# Patient Record
Sex: Male | Born: 1965 | Hispanic: No | Smoking: Current every day smoker
Health system: Southern US, Community
[De-identification: ages and names within clinical notes are randomized; demographics above are authoritative.]

## PROBLEM LIST (undated history)

## (undated) DIAGNOSIS — F410 Panic disorder [episodic paroxysmal anxiety] without agoraphobia: Secondary | ICD-10-CM

## (undated) DIAGNOSIS — I671 Cerebral aneurysm, nonruptured: Secondary | ICD-10-CM

## (undated) DIAGNOSIS — F329 Major depressive disorder, single episode, unspecified: Secondary | ICD-10-CM

## (undated) DIAGNOSIS — E119 Type 2 diabetes mellitus without complications: Secondary | ICD-10-CM

## (undated) DIAGNOSIS — J439 Emphysema, unspecified: Secondary | ICD-10-CM

## (undated) DIAGNOSIS — K631 Perforation of intestine (nontraumatic): Secondary | ICD-10-CM

## (undated) DIAGNOSIS — E785 Hyperlipidemia, unspecified: Secondary | ICD-10-CM

## (undated) DIAGNOSIS — K859 Acute pancreatitis without necrosis or infection, unspecified: Secondary | ICD-10-CM

## (undated) DIAGNOSIS — Z8614 Personal history of Methicillin resistant Staphylococcus aureus infection: Secondary | ICD-10-CM

## (undated) DIAGNOSIS — J45909 Unspecified asthma, uncomplicated: Secondary | ICD-10-CM

## (undated) DIAGNOSIS — K219 Gastro-esophageal reflux disease without esophagitis: Secondary | ICD-10-CM

## (undated) DIAGNOSIS — G3184 Mild cognitive impairment, so stated: Secondary | ICD-10-CM

## (undated) DIAGNOSIS — N529 Male erectile dysfunction, unspecified: Secondary | ICD-10-CM

## (undated) DIAGNOSIS — I10 Essential (primary) hypertension: Secondary | ICD-10-CM

## (undated) DIAGNOSIS — I509 Heart failure, unspecified: Secondary | ICD-10-CM

## (undated) DIAGNOSIS — Z515 Encounter for palliative care: Secondary | ICD-10-CM

## (undated) DIAGNOSIS — G8929 Other chronic pain: Secondary | ICD-10-CM

## (undated) DIAGNOSIS — F32A Depression, unspecified: Secondary | ICD-10-CM

## (undated) DIAGNOSIS — I2489 Other forms of acute ischemic heart disease: Secondary | ICD-10-CM

## (undated) DIAGNOSIS — F419 Anxiety disorder, unspecified: Secondary | ICD-10-CM

## (undated) DIAGNOSIS — IMO0002 Reserved for concepts with insufficient information to code with codable children: Secondary | ICD-10-CM

## (undated) DIAGNOSIS — N185 Chronic kidney disease, stage 5: Secondary | ICD-10-CM

## (undated) DIAGNOSIS — Z720 Tobacco use: Secondary | ICD-10-CM

## (undated) DIAGNOSIS — M549 Dorsalgia, unspecified: Secondary | ICD-10-CM

## (undated) DIAGNOSIS — I5032 Chronic diastolic (congestive) heart failure: Secondary | ICD-10-CM

## (undated) DIAGNOSIS — A4902 Methicillin resistant Staphylococcus aureus infection, unspecified site: Secondary | ICD-10-CM

## (undated) DIAGNOSIS — H4310 Vitreous hemorrhage, unspecified eye: Secondary | ICD-10-CM

## (undated) DIAGNOSIS — F1910 Other psychoactive substance abuse, uncomplicated: Secondary | ICD-10-CM

## (undated) DIAGNOSIS — N184 Chronic kidney disease, stage 4 (severe): Secondary | ICD-10-CM

## (undated) DIAGNOSIS — I609 Nontraumatic subarachnoid hemorrhage, unspecified: Secondary | ICD-10-CM

## (undated) DIAGNOSIS — G4730 Sleep apnea, unspecified: Secondary | ICD-10-CM

## (undated) DIAGNOSIS — E669 Obesity, unspecified: Secondary | ICD-10-CM

## (undated) HISTORY — DX: Sleep apnea, unspecified: G47.30

## (undated) HISTORY — DX: Methicillin resistant Staphylococcus aureus infection, unspecified site: A49.02

## (undated) HISTORY — DX: Obesity, unspecified: E66.9

## (undated) HISTORY — PX: OTHER SURGICAL HISTORY: SHX169

## (undated) HISTORY — DX: Essential (primary) hypertension: I10

## (undated) HISTORY — DX: Chronic kidney disease, stage 4 (severe): N18.4

## (undated) HISTORY — DX: Reserved for concepts with insufficient information to code with codable children: IMO0002

## (undated) HISTORY — DX: Type 2 diabetes mellitus without complications: E11.9

## (undated) HISTORY — DX: Heart failure, unspecified: I50.9

## (undated) HISTORY — DX: Other psychoactive substance abuse, uncomplicated: F19.10

## (undated) HISTORY — PX: KNEE SURGERY: SHX244

## (undated) HISTORY — DX: Major depressive disorder, single episode, unspecified: F32.9

## (undated) HISTORY — DX: Acute pancreatitis without necrosis or infection, unspecified: K85.90

## (undated) HISTORY — DX: Nontraumatic subarachnoid hemorrhage, unspecified: I60.9

## (undated) HISTORY — DX: Depression, unspecified: F32.A

## (undated) HISTORY — DX: Panic disorder (episodic paroxysmal anxiety): F41.0

## (undated) HISTORY — DX: Dorsalgia, unspecified: M54.9

## (undated) HISTORY — DX: Tobacco use: Z72.0

## (undated) HISTORY — DX: Perforation of intestine (nontraumatic): K63.1

## (undated) HISTORY — DX: Chronic diastolic (congestive) heart failure: I50.32

## (undated) HISTORY — DX: Vitreous hemorrhage, unspecified eye: H43.10

## (undated) HISTORY — DX: Emphysema, unspecified: J43.9

## (undated) HISTORY — DX: Unspecified asthma, uncomplicated: J45.909

## (undated) HISTORY — DX: Gastro-esophageal reflux disease without esophagitis: K21.9

## (undated) HISTORY — DX: Mild cognitive impairment of uncertain or unknown etiology: G31.84

## (undated) HISTORY — DX: Anxiety disorder, unspecified: F41.9

---

## 2003-11-29 ENCOUNTER — Other Ambulatory Visit: Payer: Self-pay

## 2004-11-20 ENCOUNTER — Emergency Department: Payer: Self-pay | Admitting: Emergency Medicine

## 2005-01-24 ENCOUNTER — Inpatient Hospital Stay: Payer: Self-pay | Admitting: Psychiatry

## 2005-01-24 IMAGING — US ABDOMEN ULTRASOUND
1 series · 13 of 25 positions shown · non-contrast
Comparison: none

REASON FOR EXAM: Pancreatis, ETOH abuse, rule out gallbadder, history of
gastritis
COMMENTS:

[Series 1: abdomen ultrasound · 0.39mm/px · 13 of 52 slices shown]
[im 1/52]
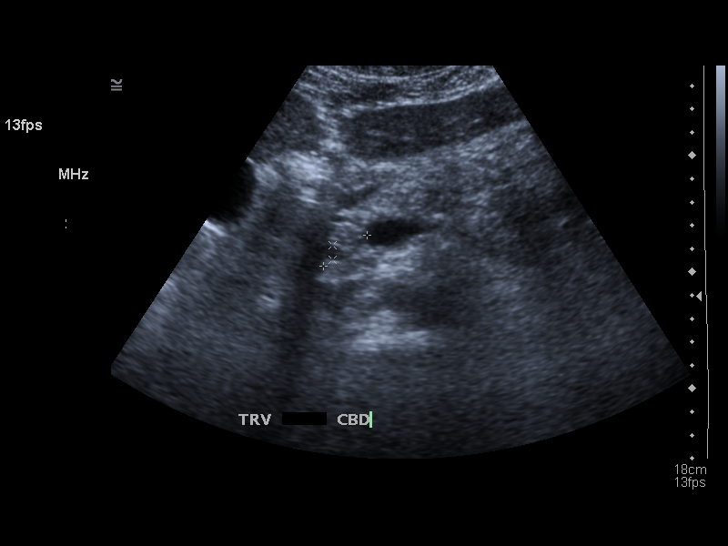
[im 5/52]
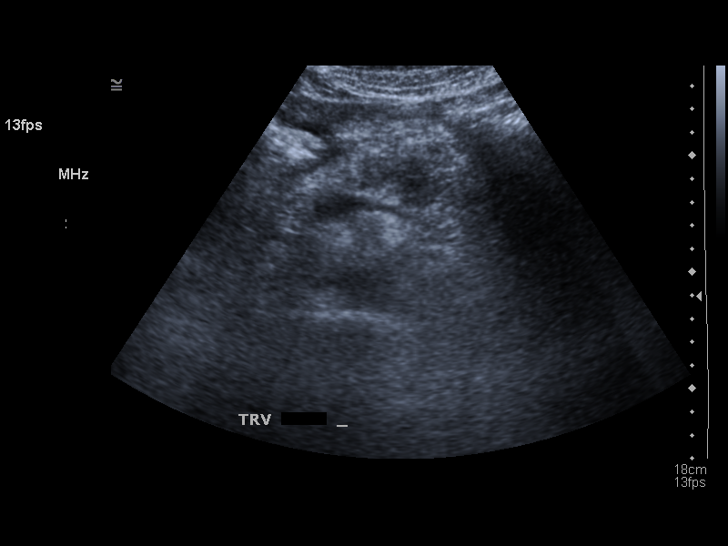
[im 9/52]
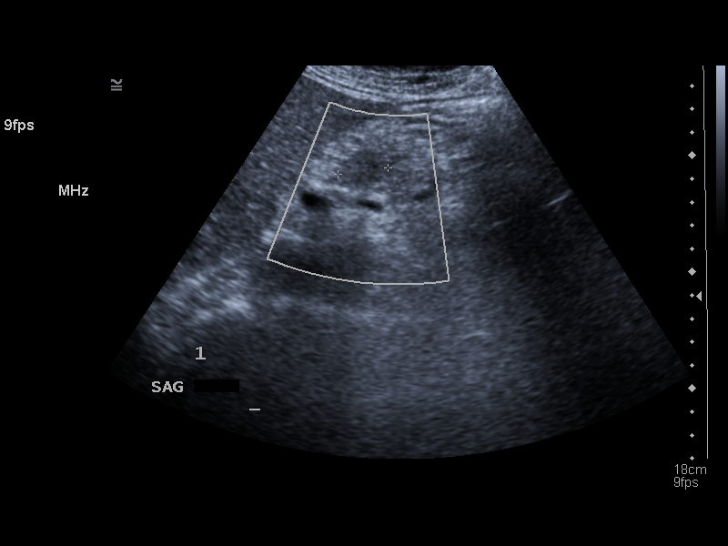
[im 13/52]
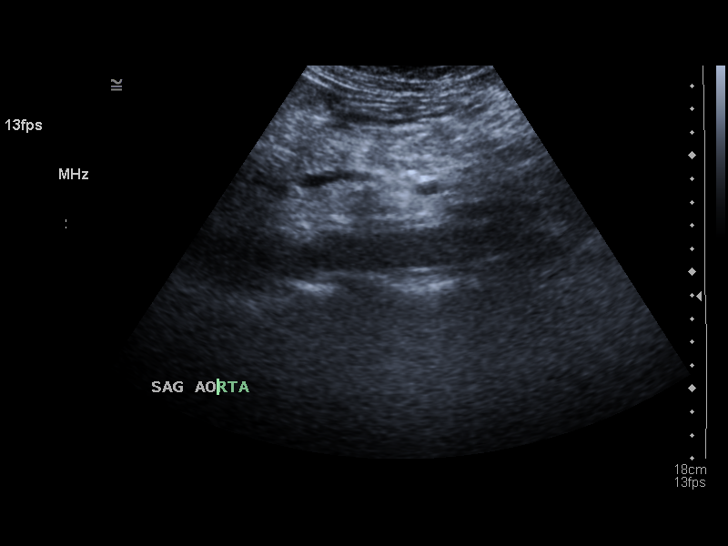
[im 18/52]
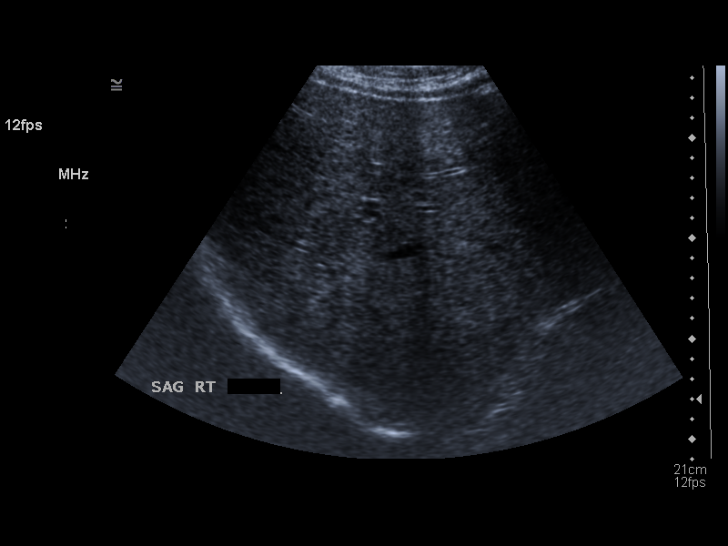
[im 22/52]
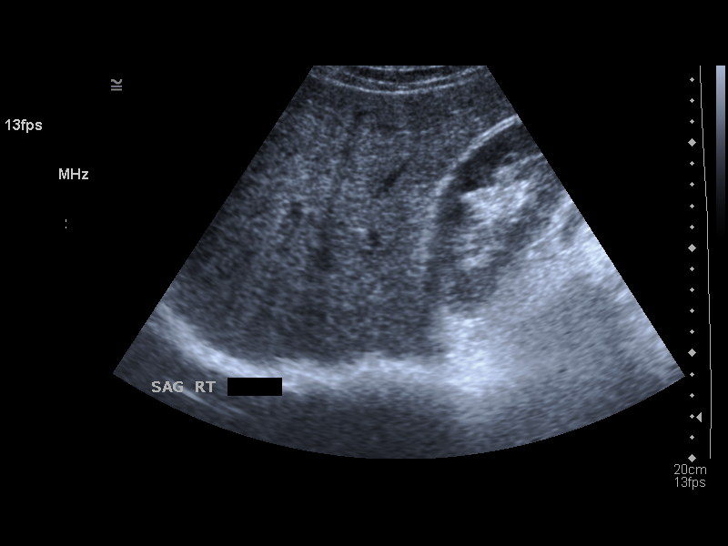
[im 26/52]
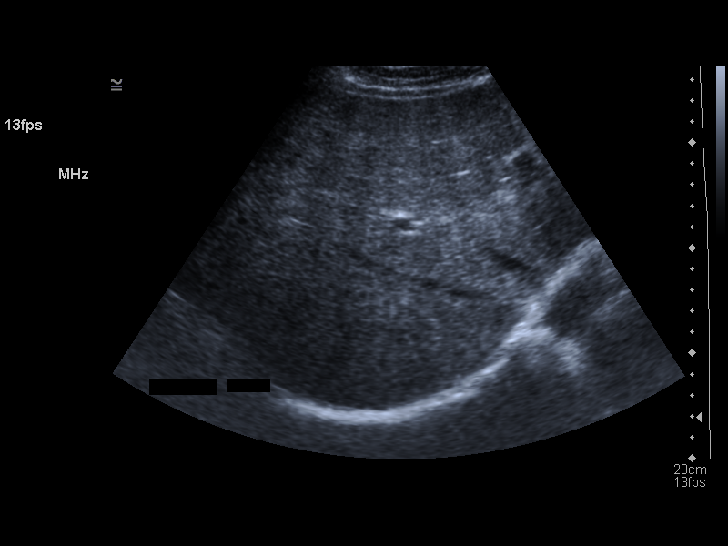
[im 30/52]
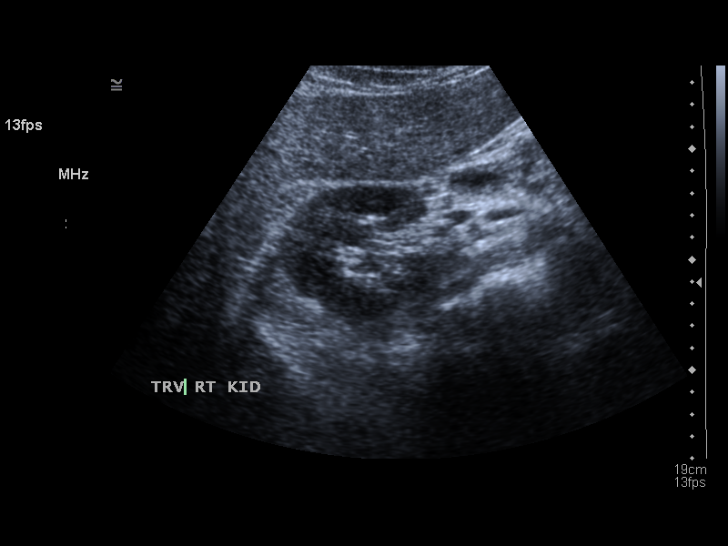
[im 35/52]
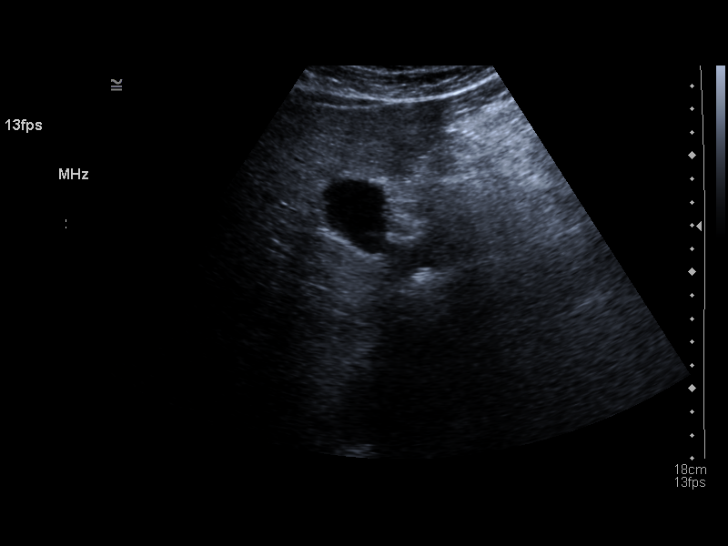
[im 39/52]
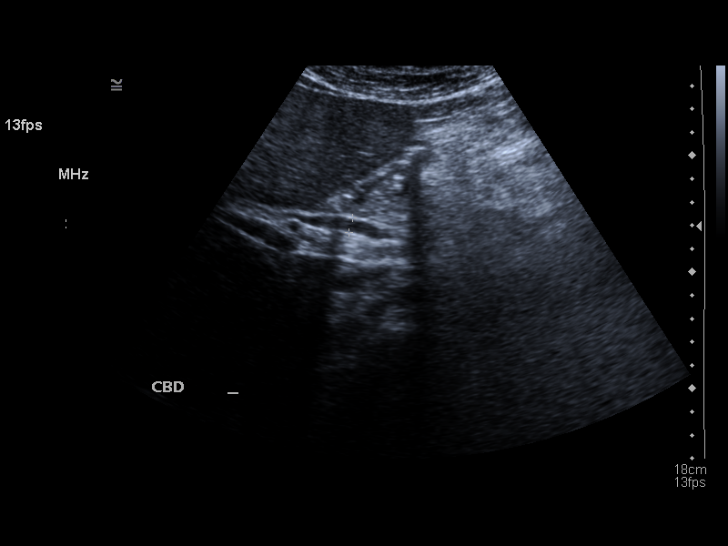
[im 43/52]
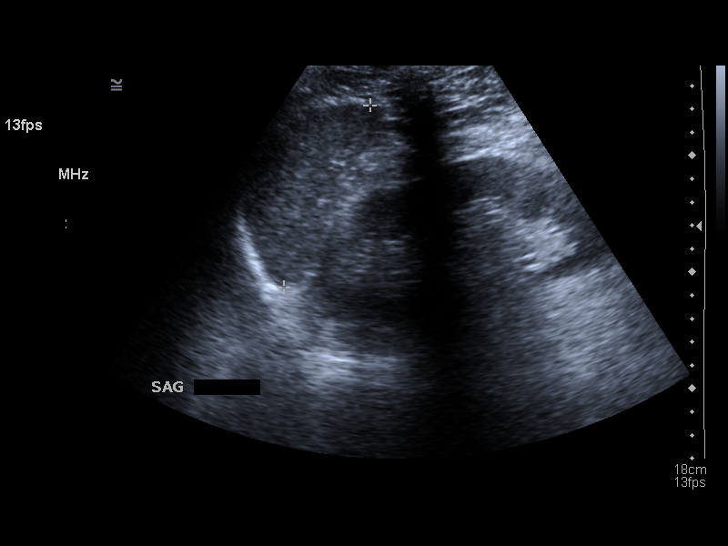
[im 47/52]
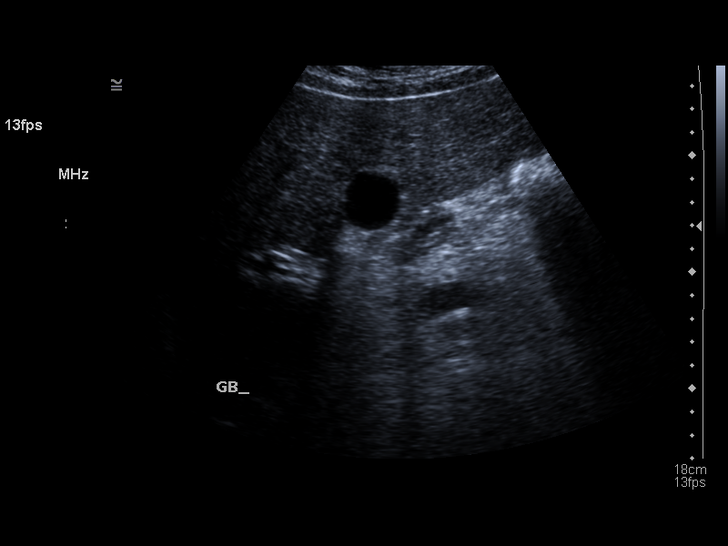
[im 52/52]
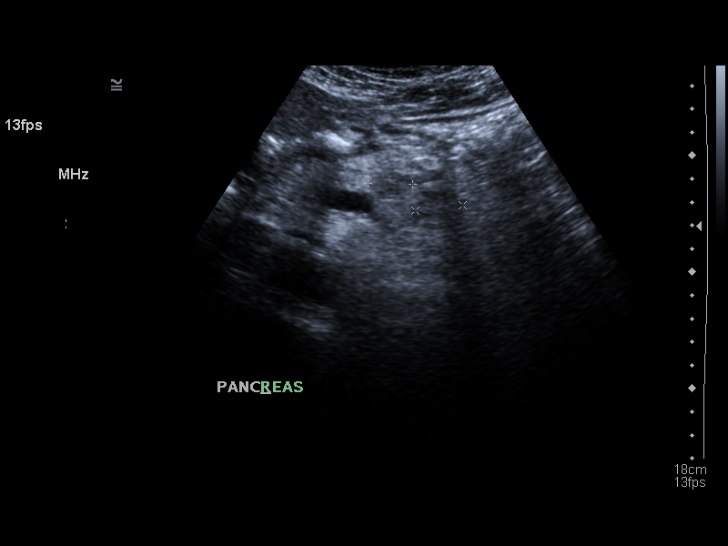

[13 of 25 positions shown; findings below may reference images not displayed]

PROCEDURE:     US  - US ABDOMEN GENERAL SURVEY  - [DATE]  [DATE]

RESULT:     The liver and spleen are normal in appearance.  The pancreas is
visualized.  The body and tail of the pancreas are enlarged.  The body of
the pancreas measures 4 cm in AP diameter and the pancreatic tail measures
3.0 cm in AP diameter.  The pancreas in the region of the body and tail has
increased in size since the prior exam of [DATE].  There are noted
two hypoechoic areas in the body of the pancreas.  It is to me uncertain as
to whether these are focal masses or focal areas of pancreatitis.  Further
evaluation of the pancreas by CT is recommended.  A note is made that the
pancreatic head is normal in appearance.

No gallstones are seen.  There is no thickening of the gallbladder wall.
The common bile duct measures 6.4 mm in diameter, which is upper limits for
normal and which has increased since the previous examination.  The kidneys
show no hydronephrosis.  There is no ascites.
IMPRESSION: There is prominence of the body and tail of the pancreas as described above
suspicious for mass although focal pancreatitis could produce similar
changes.  Further evaluation of the pancreas by CT is recommended.

The common bile duct measures 6.4 mm in diameter, which is upper limits for
normal and which has increased since the prior exam.  The gallbladder,
however, is normal in appearance with there being no evidence for
gallstones.

## 2005-01-26 IMAGING — CT CT ABDOMEN W/ CM
1 of 2 series · 14 of 32 positions shown, 19 images · non-contrast
Comparison: none

REASON FOR EXAM: abdominal pain, abnormal Abdominal Ultrasound with
pancreatic enlargement
COMMENTS:  LMP: (Male)

[Series 2: pancreas · axial · 0.72mm/px · z∈[-304,-40]mm · 14 of 74 slices shown, 19 images]
[im 4/74  soft-tissue]
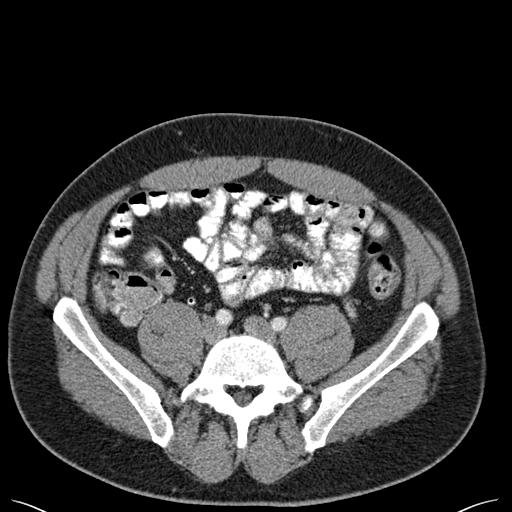
[im 4/74  bone]
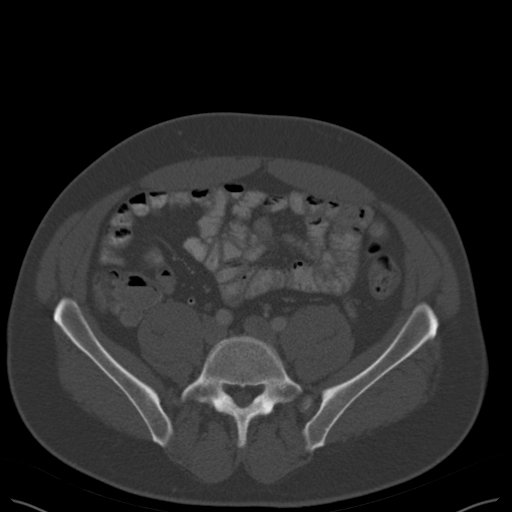
[im 11/74  soft-tissue]
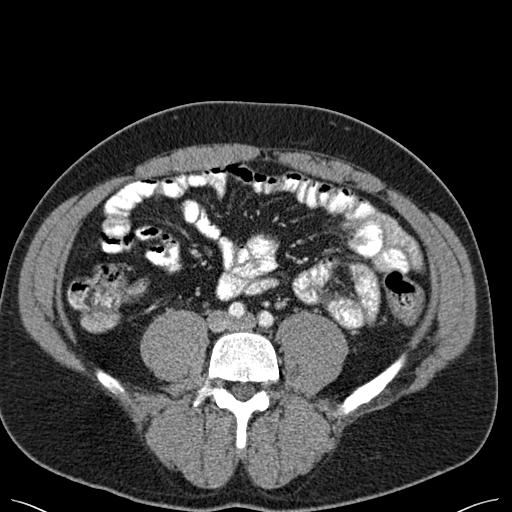
[im 14/74  soft-tissue]
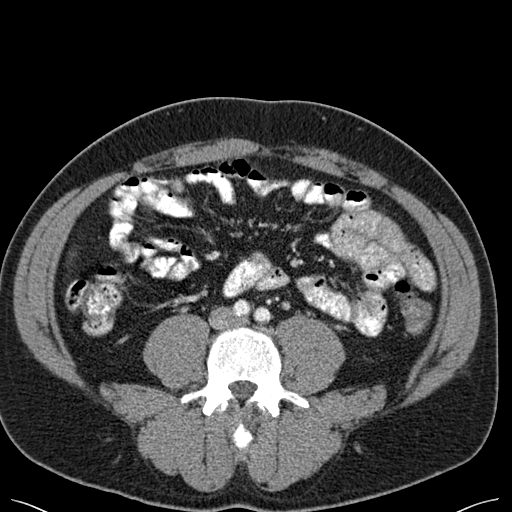
[im 21/74  soft-tissue]
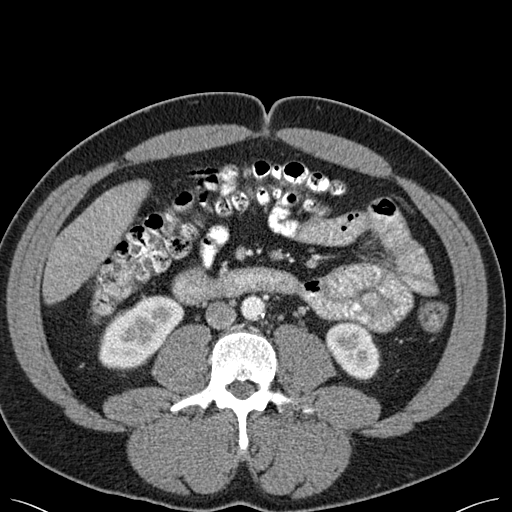
[im 25/74  soft-tissue]
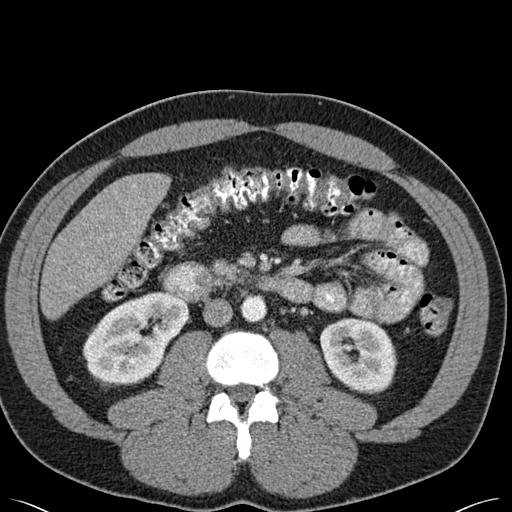
[im 32/74  soft-tissue]
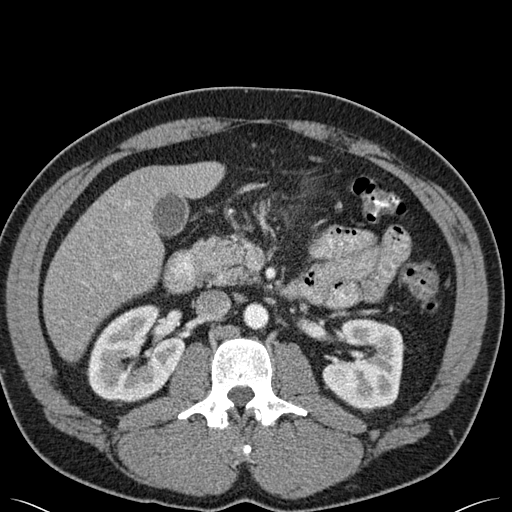
[im 39/74  soft-tissue]
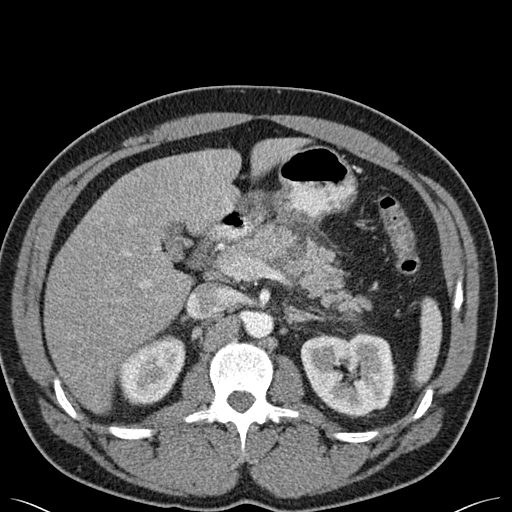
[im 42/74  soft-tissue]
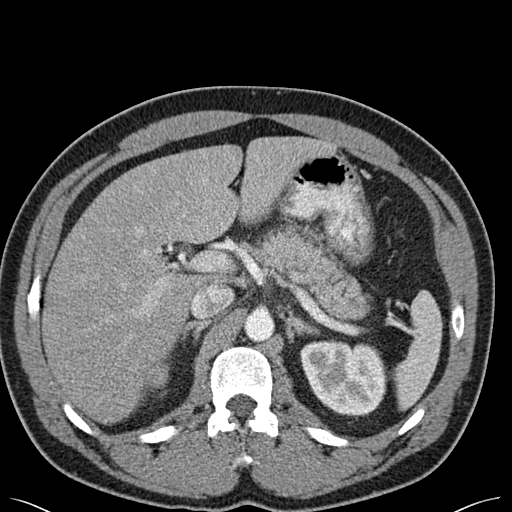
[im 49/74  soft-tissue]
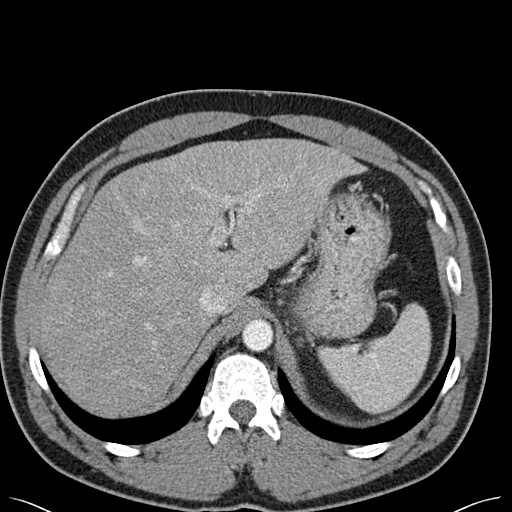
[im 49/74  bone]
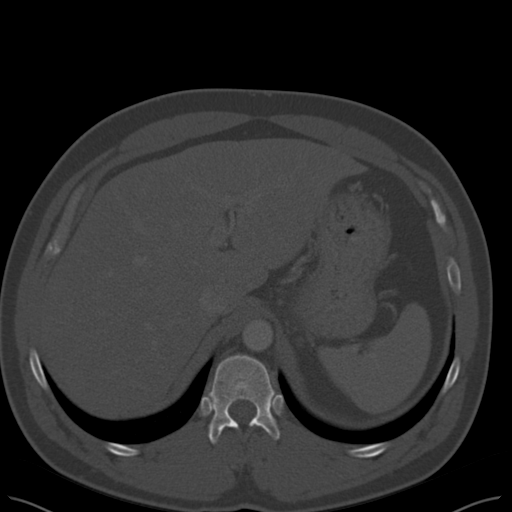
[im 53/74  soft-tissue]
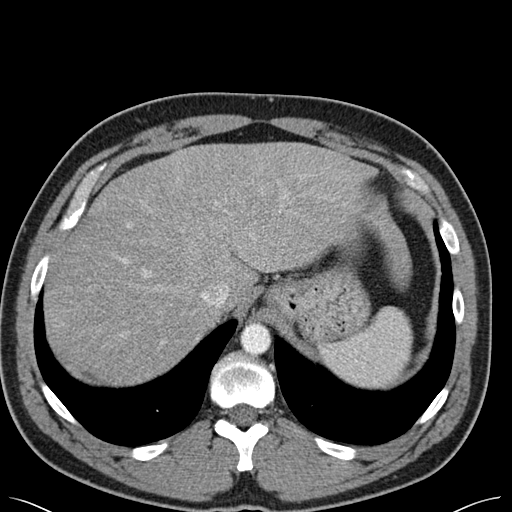
[im 60/74  soft-tissue]
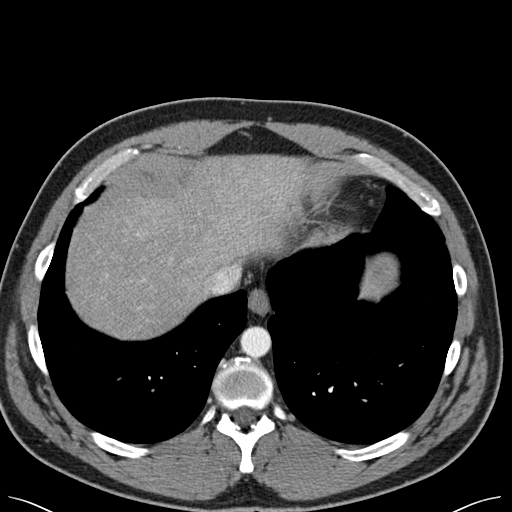
[im 60/74  lung]
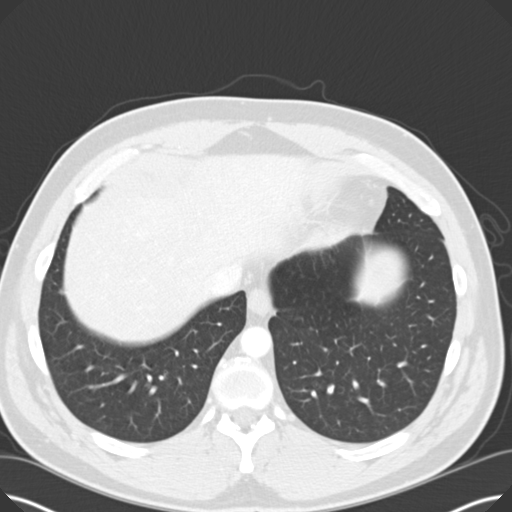
[im 63/74  soft-tissue]
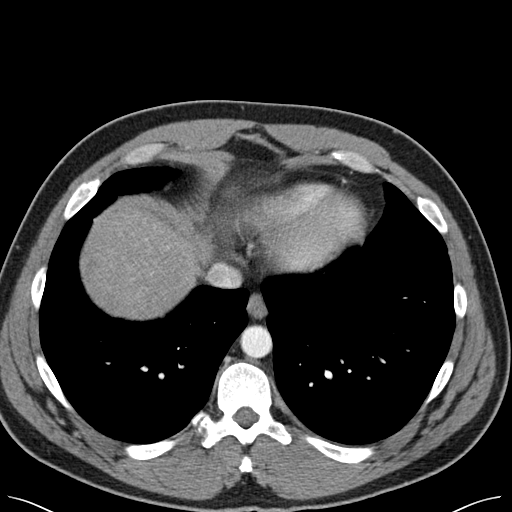
[im 63/74  lung]
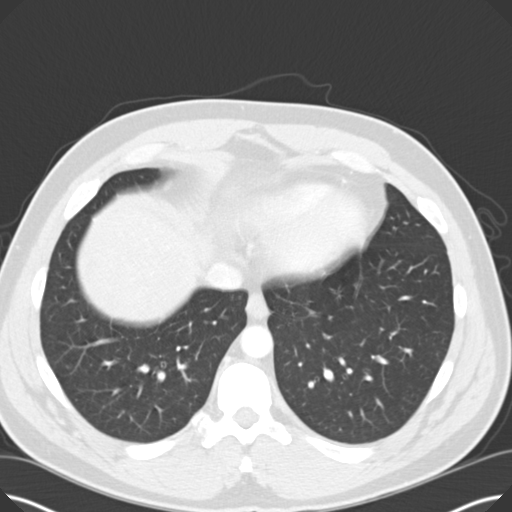
[im 67/74  lung]
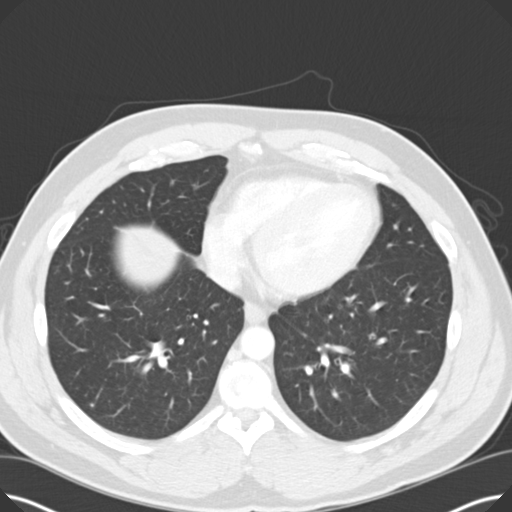
[im 70/74  soft-tissue]
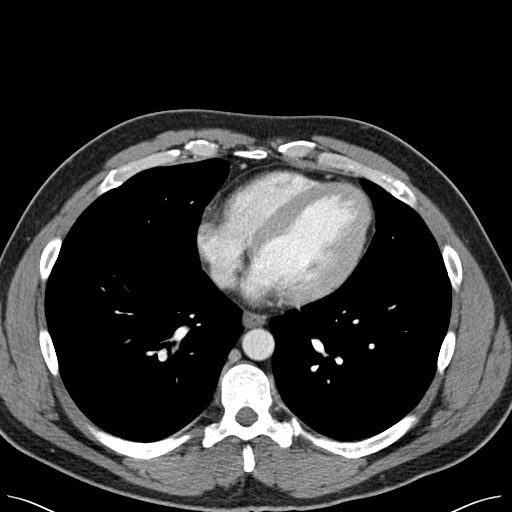
[im 70/74  lung]
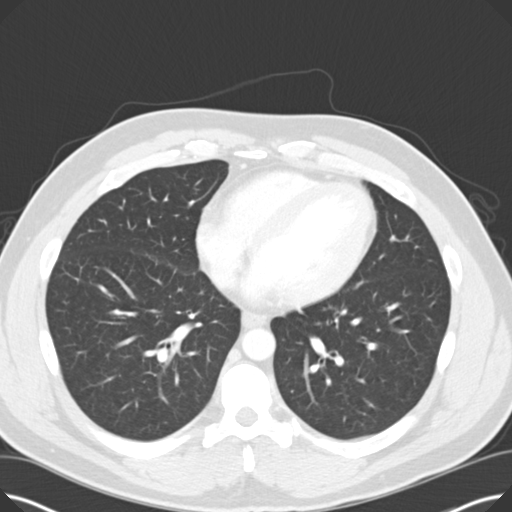

[14 of 32 positions shown; findings below may reference images not displayed]

PROCEDURE:     CT  - CT ABDOMEN COMPLEX W  - [DATE]  [DATE]

RESULT:     Spiral 4.0 mm sections are obtained from the lung bases through
the pubic symphysis status post intravenous administration of 100 cc's of
[AX] and oral contrast.

Evaluation of the lung bases demonstrates no evidence of focal infiltrates,
effusions or edema.

The liver, spleen, adrenals and kidneys are unremarkable.

Evaluation of the pancreas demonstrates enlargement of the head and proximal
body of the pancreas with focal areas of low attenuation within the
pancreatic parenchyma. There are edematous changes within the surrounding
mesenteric fat as well as a trace amount of fluid within the peripancreatic
fat along the head and proximal body region. This study is compared to a
previous study of the abdomen and pelvis dated [DATE]. The findings
within the pancreas have occurred in the interim and may represent the
sequelae of pancreatitis. Correlation with the patient's physical signs and
pancreatic function tests is recommended. Etiologies such as neoplastic
disease cannot be completely excluded though are of lower differential
consideration at this time unless the pancreatic laboratory data dictates
otherwise. There does not appear to be evidence of abdominal adenopathy or
drainable loculated fluid collections. The SMA, portal vein, SMV and splenic
artery appear intact with no evidence of aneurysmal dilatation. No CT
evidence is appreciated to suggest a pseudocyst.
IMPRESSION: 1.     Findings likely representing the sequelae of pancreatitis involving
the head and body of the pancreas as described above. These findings appear
to have occurred in the interim when compared to the previous study dated
[DATE]. Correlation with pancreatic function tests and the patient's
clinical signs and symptoms is recommended.
2.     A repeat evaluation can be obtained as clinically warranted to
monitor the pancreas.

## 2005-01-30 ENCOUNTER — Ambulatory Visit: Payer: Self-pay | Admitting: Unknown Physician Specialty

## 2005-02-07 ENCOUNTER — Ambulatory Visit: Payer: Self-pay | Admitting: Unknown Physician Specialty

## 2005-03-10 ENCOUNTER — Ambulatory Visit: Payer: Self-pay | Admitting: Unknown Physician Specialty

## 2005-04-09 ENCOUNTER — Ambulatory Visit: Payer: Self-pay | Admitting: Unknown Physician Specialty

## 2005-12-10 DIAGNOSIS — K631 Perforation of intestine (nontraumatic): Secondary | ICD-10-CM

## 2005-12-10 HISTORY — PX: COLON SURGERY: SHX602

## 2005-12-10 HISTORY — DX: Perforation of intestine (nontraumatic): K63.1

## 2006-08-08 ENCOUNTER — Emergency Department: Payer: Self-pay

## 2006-08-08 IMAGING — CT CT ABD-PELV W/ CM
1 of 2 series · 15 of 32 positions shown, 19 images · non-contrast
Comparison: none

REASON FOR EXAM: Abdominal and pelvic trauma
COMMENTS:

[Series 2: abdomen · axial · 0.80mm/px · z∈[-9,+431]mm · 15 of 96 slices shown, 19 images]
[im 4/96  soft-tissue]
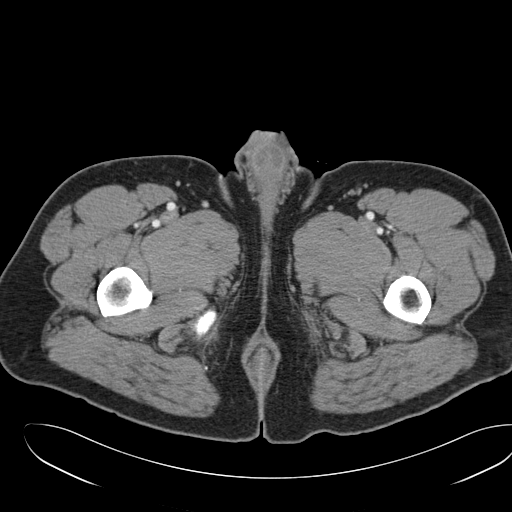
[im 4/96  bone]
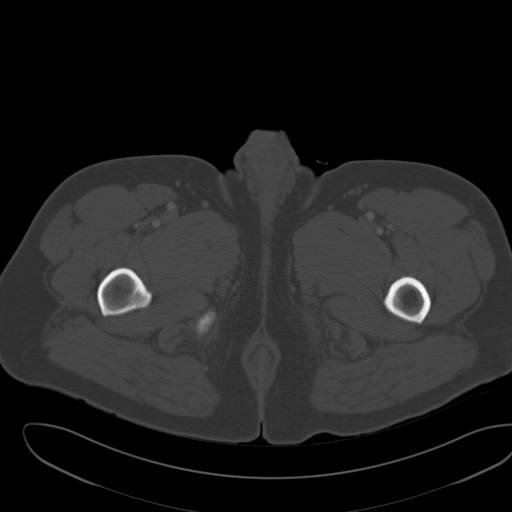
[im 12/96  soft-tissue]
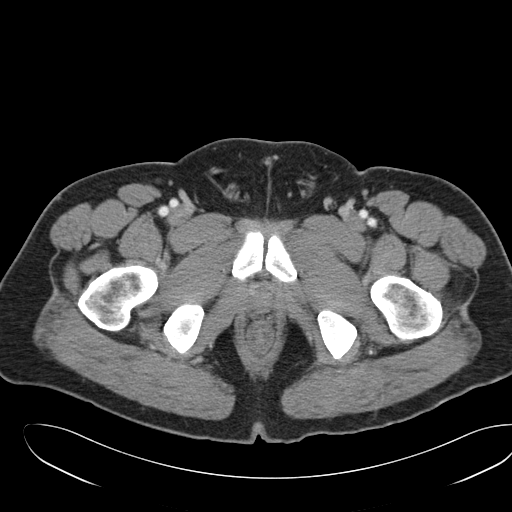
[im 20/96  soft-tissue]
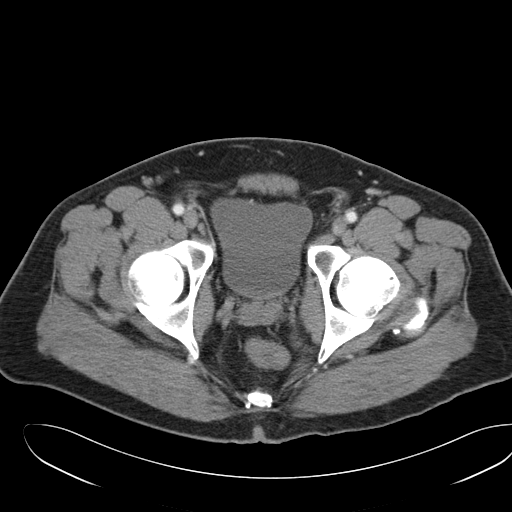
[im 28/96  soft-tissue]
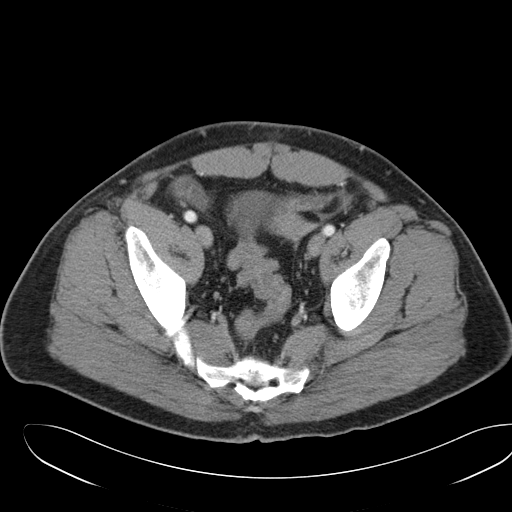
[im 32/96  soft-tissue]
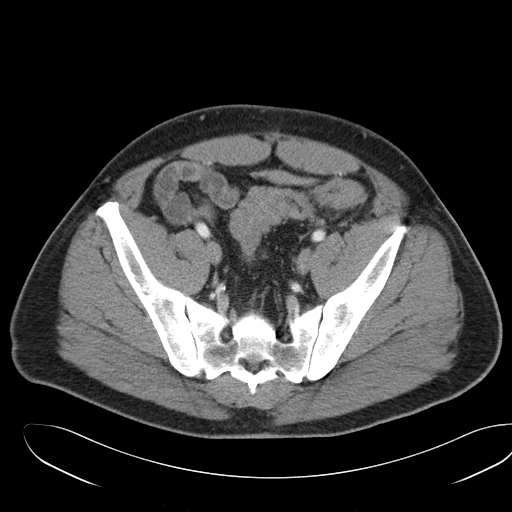
[im 40/96  soft-tissue]
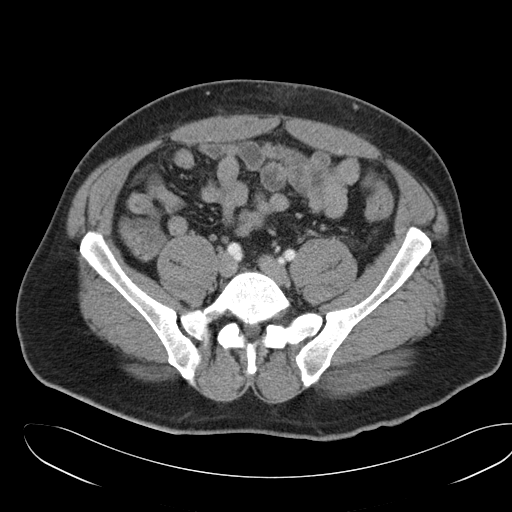
[im 48/96  soft-tissue]
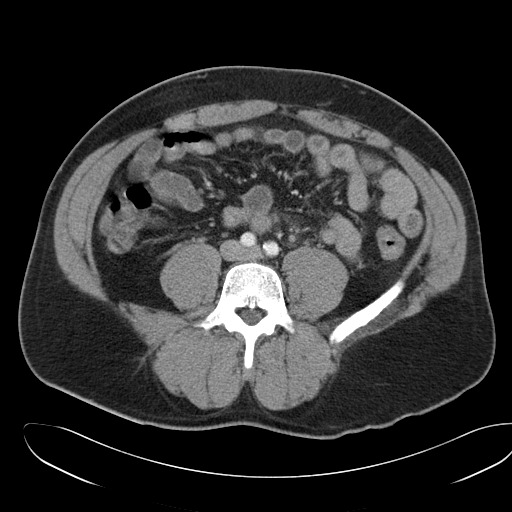
[im 56/96  soft-tissue]
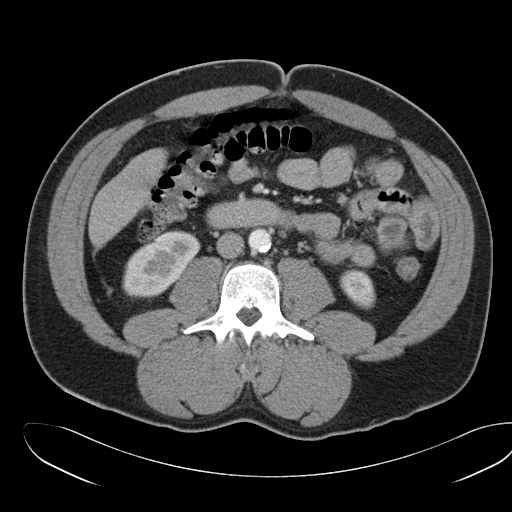
[im 64/96  soft-tissue]
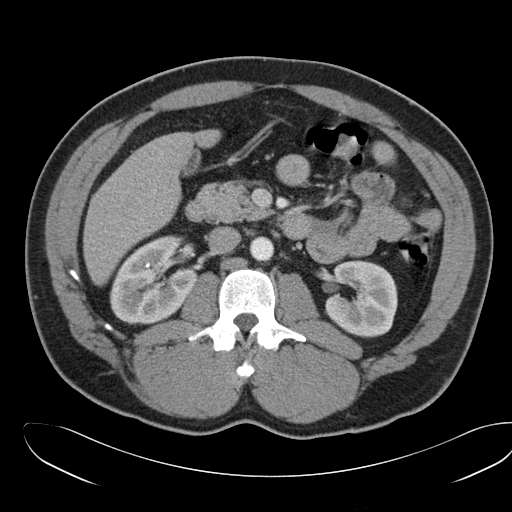
[im 64/96  bone]
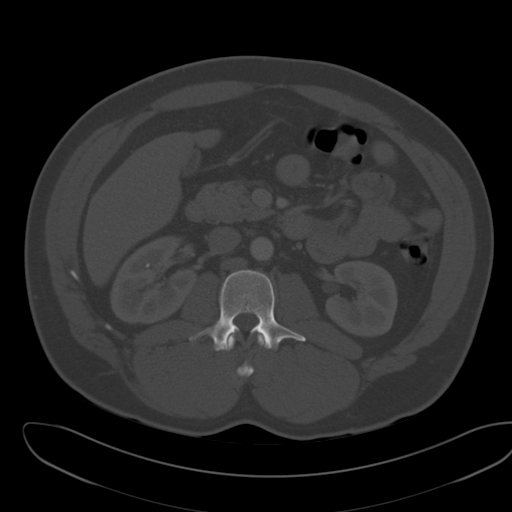
[im 68/96  soft-tissue]
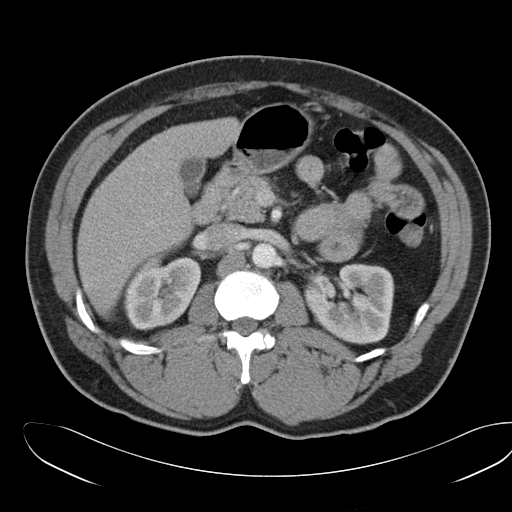
[im 76/96  soft-tissue]
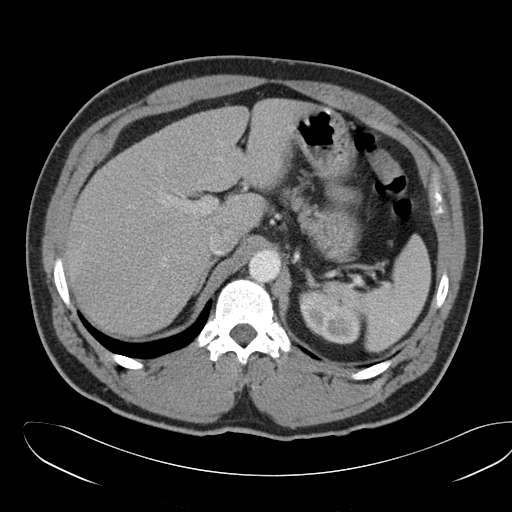
[im 80/96  lung]
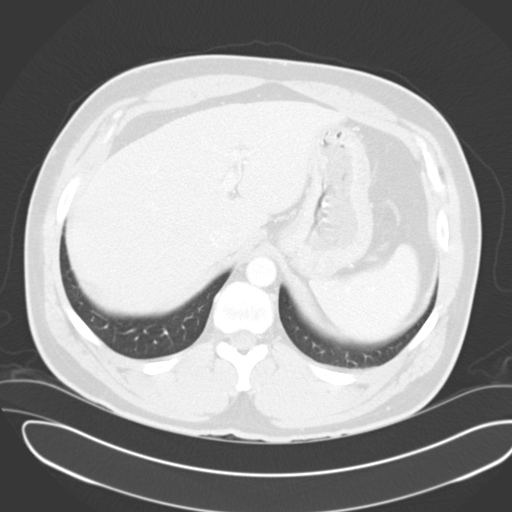
[im 84/96  soft-tissue]
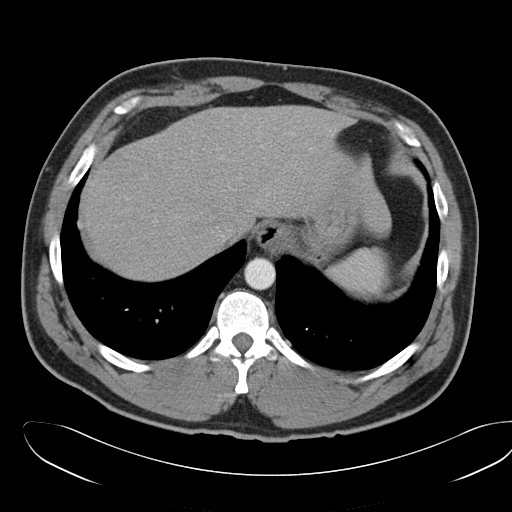
[im 84/96  lung]
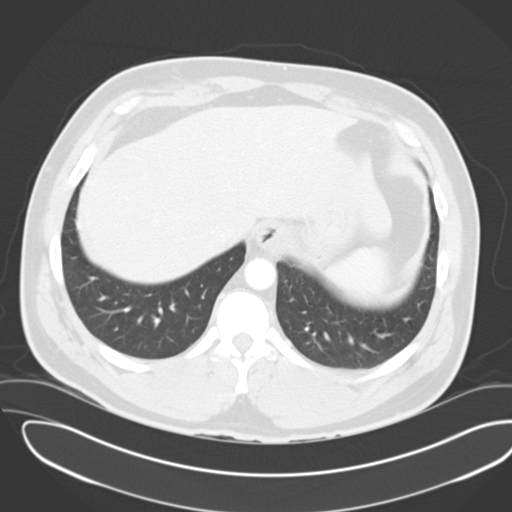
[im 88/96  lung]
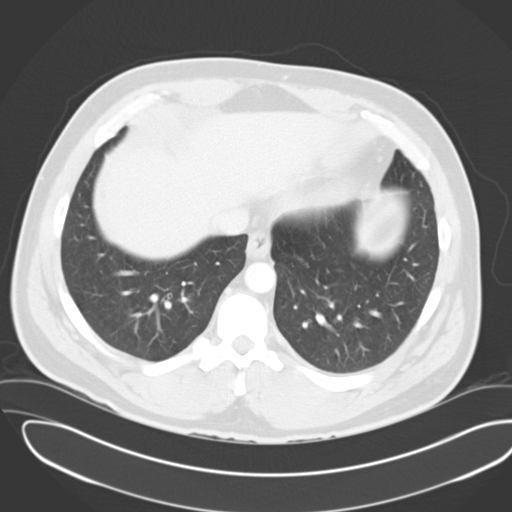
[im 92/96  soft-tissue]
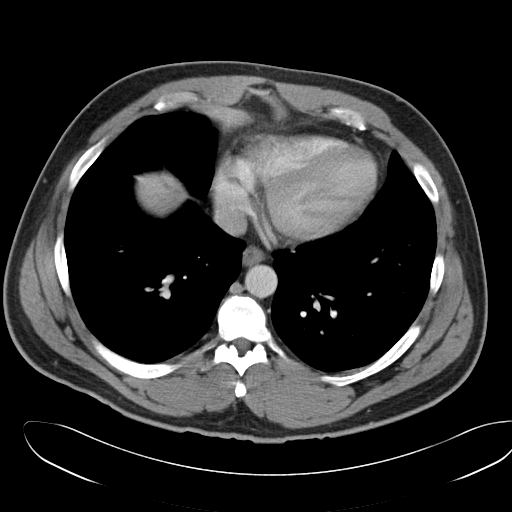
[im 92/96  lung]
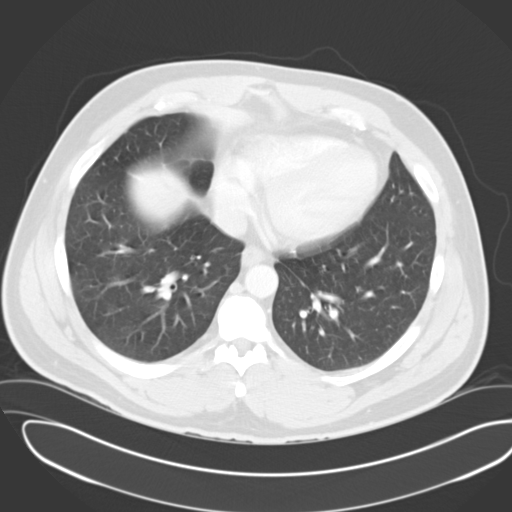

[15 of 32 positions shown; findings below may reference images not displayed]

PROCEDURE:     CT  - CT ABDOMEN / PELVIS  W  - [DATE] [DATE]

RESULT:          IV contrast only CT scan of the abdomen and pelvis is
performed.  The previous films could not be viewed at this time because of
discrepancy in the patient's demographic information.

The liver, spleen, kidneys, aorta, bowel and gallbladder appear to be
unremarkable.  The adrenal glands appear to be grossly normal.  There is no
evidence of visceral laceration or subcapsular hemorrhage.  There is no
intraperitoneal free air, free fluid, or abnormal fluid collection.  No
gross bony abnormality is demonstrated.
IMPRESSION: 1.     No findings to suggest abdominal visceral trauma.
2.     There is no evidence of a pneumothorax.  There is some minimal
increased density in the subcutaneous fat near the midline in the
infraumbilical region which could be secondary to trauma in this area.
3.     No gross bony abnormality is seen.

## 2006-08-08 IMAGING — CR DG CHEST 2V
1 series · 2 of 2 positions shown · non-contrast
Comparison: none

REASON FOR EXAM: trauma       rm 15
COMMENTS:

PROCEDURE:     DXR - DXR CHEST PA (OR AP) AND LATERAL  - [DATE] [DATE]
RESULT:     Lungs are clear. The cardiac silhouette and visualized bony
skeleton are unremarkable.

[Series 1: view not recorded · 0.17mm/px · 2 of 2 slices shown]
[im 1/2]
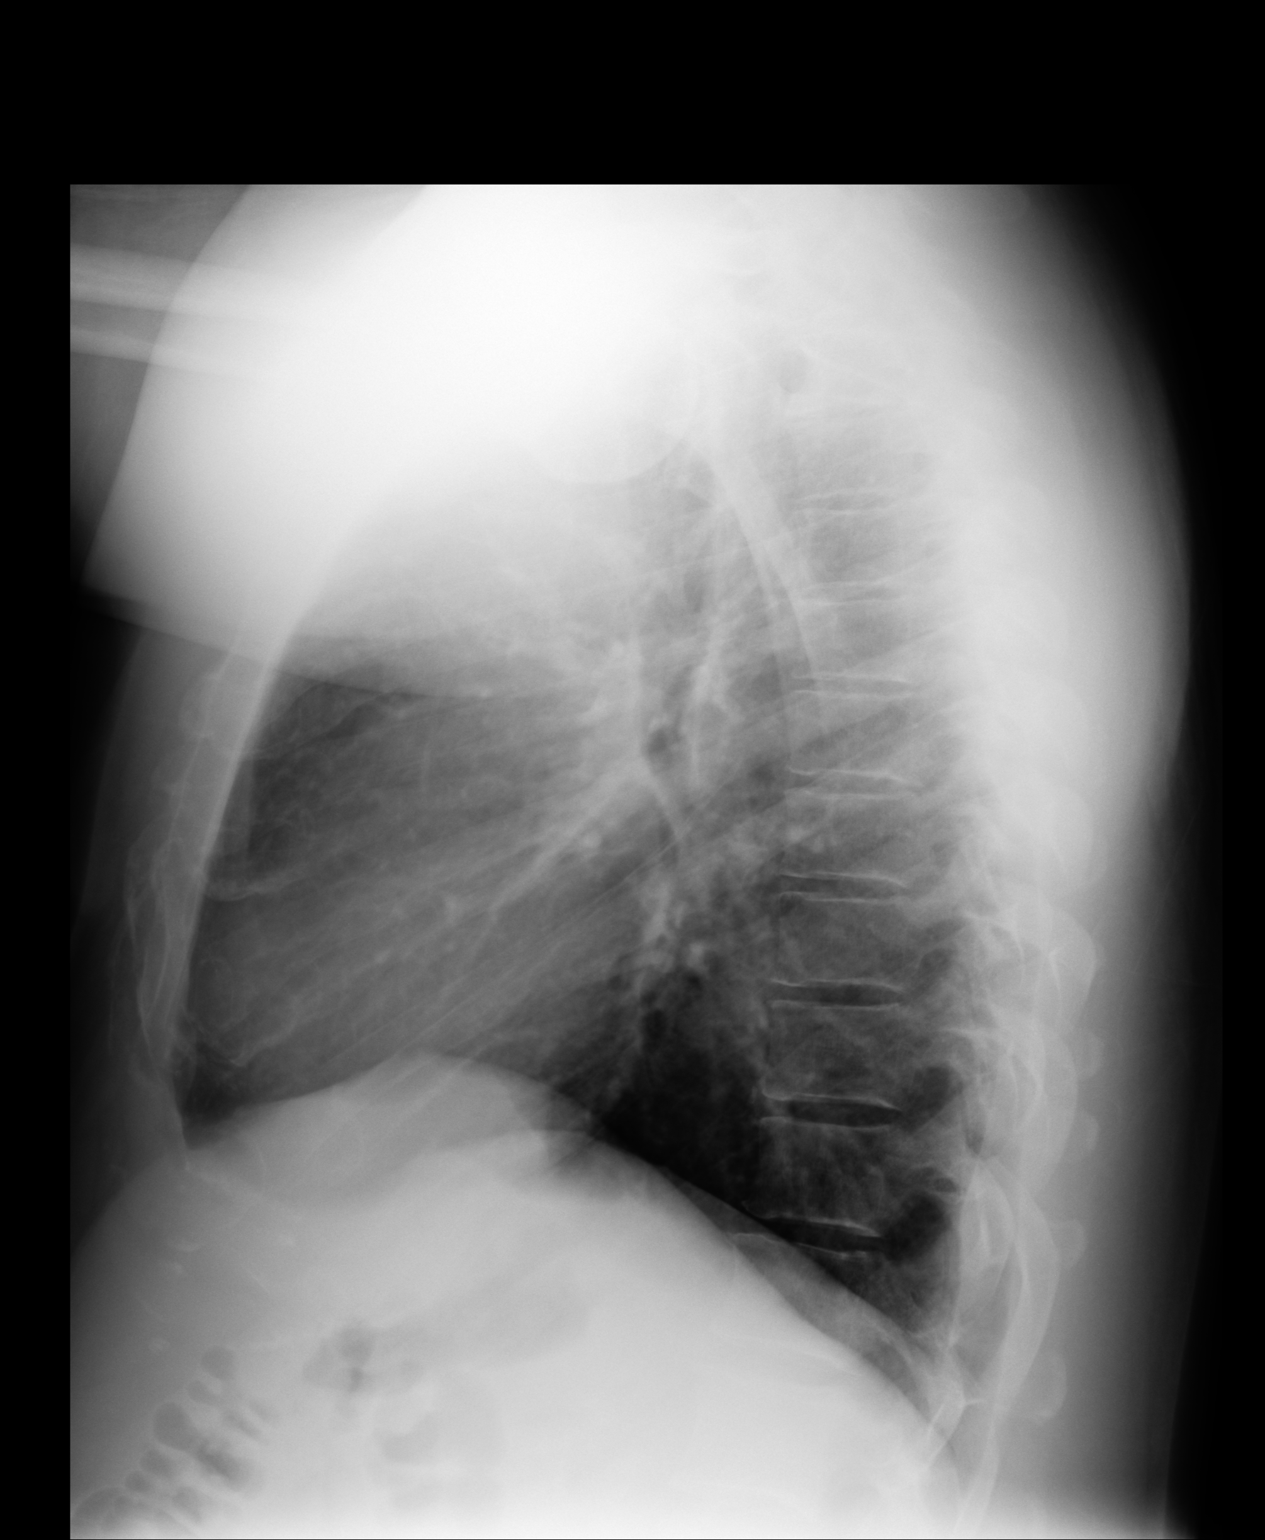
[im 2/2]
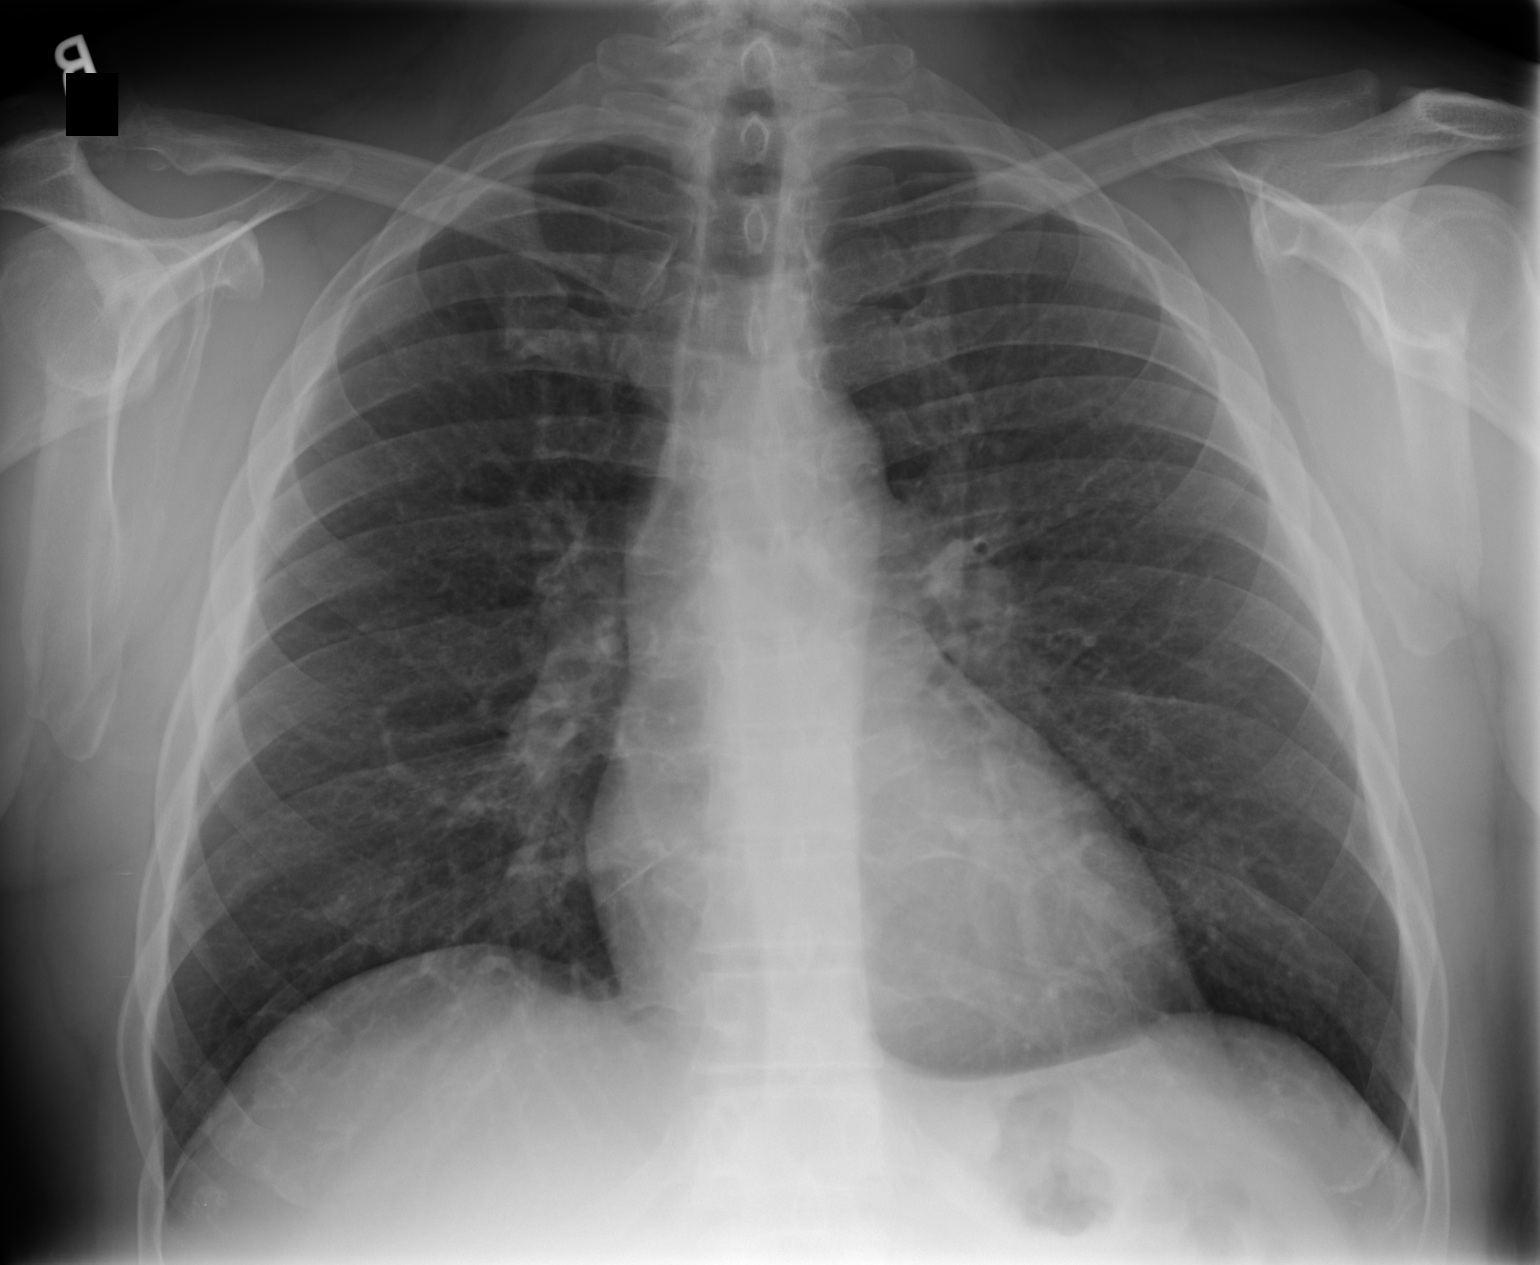

[2 of 2 positions shown; findings below may reference images not displayed]

IMPRESSION: 1)Chest radiograph without evidence of acute cardiopulmonary disease.

## 2006-08-10 ENCOUNTER — Emergency Department: Payer: Self-pay

## 2006-08-12 ENCOUNTER — Emergency Department: Payer: Self-pay | Admitting: Emergency Medicine

## 2006-08-12 IMAGING — CR DG ABDOMEN 1V
1 series · 2 of 2 positions shown · non-contrast
Comparison: none

REASON FOR EXAM: Abdominal pain
COMMENTS:

[Series 1: view not recorded · 0.17mm/px · 2 of 2 slices shown]
[im 1/2]
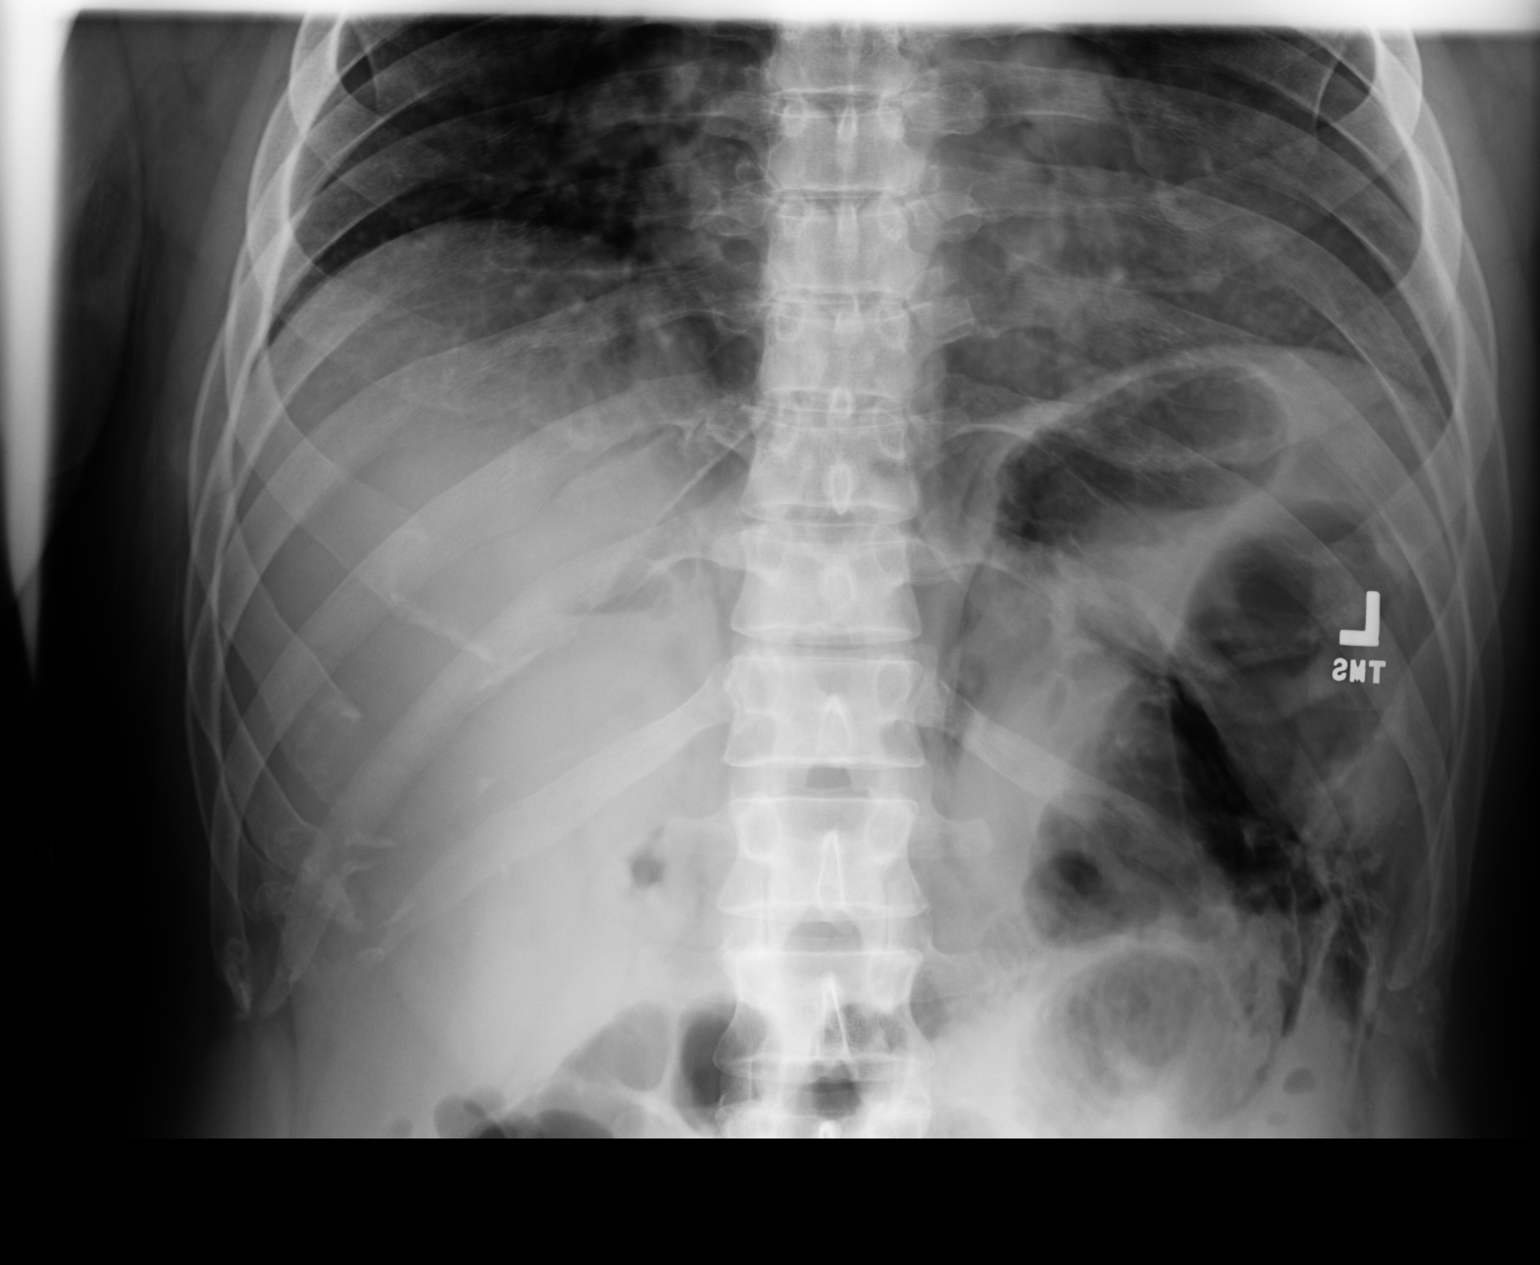
[im 2/2]
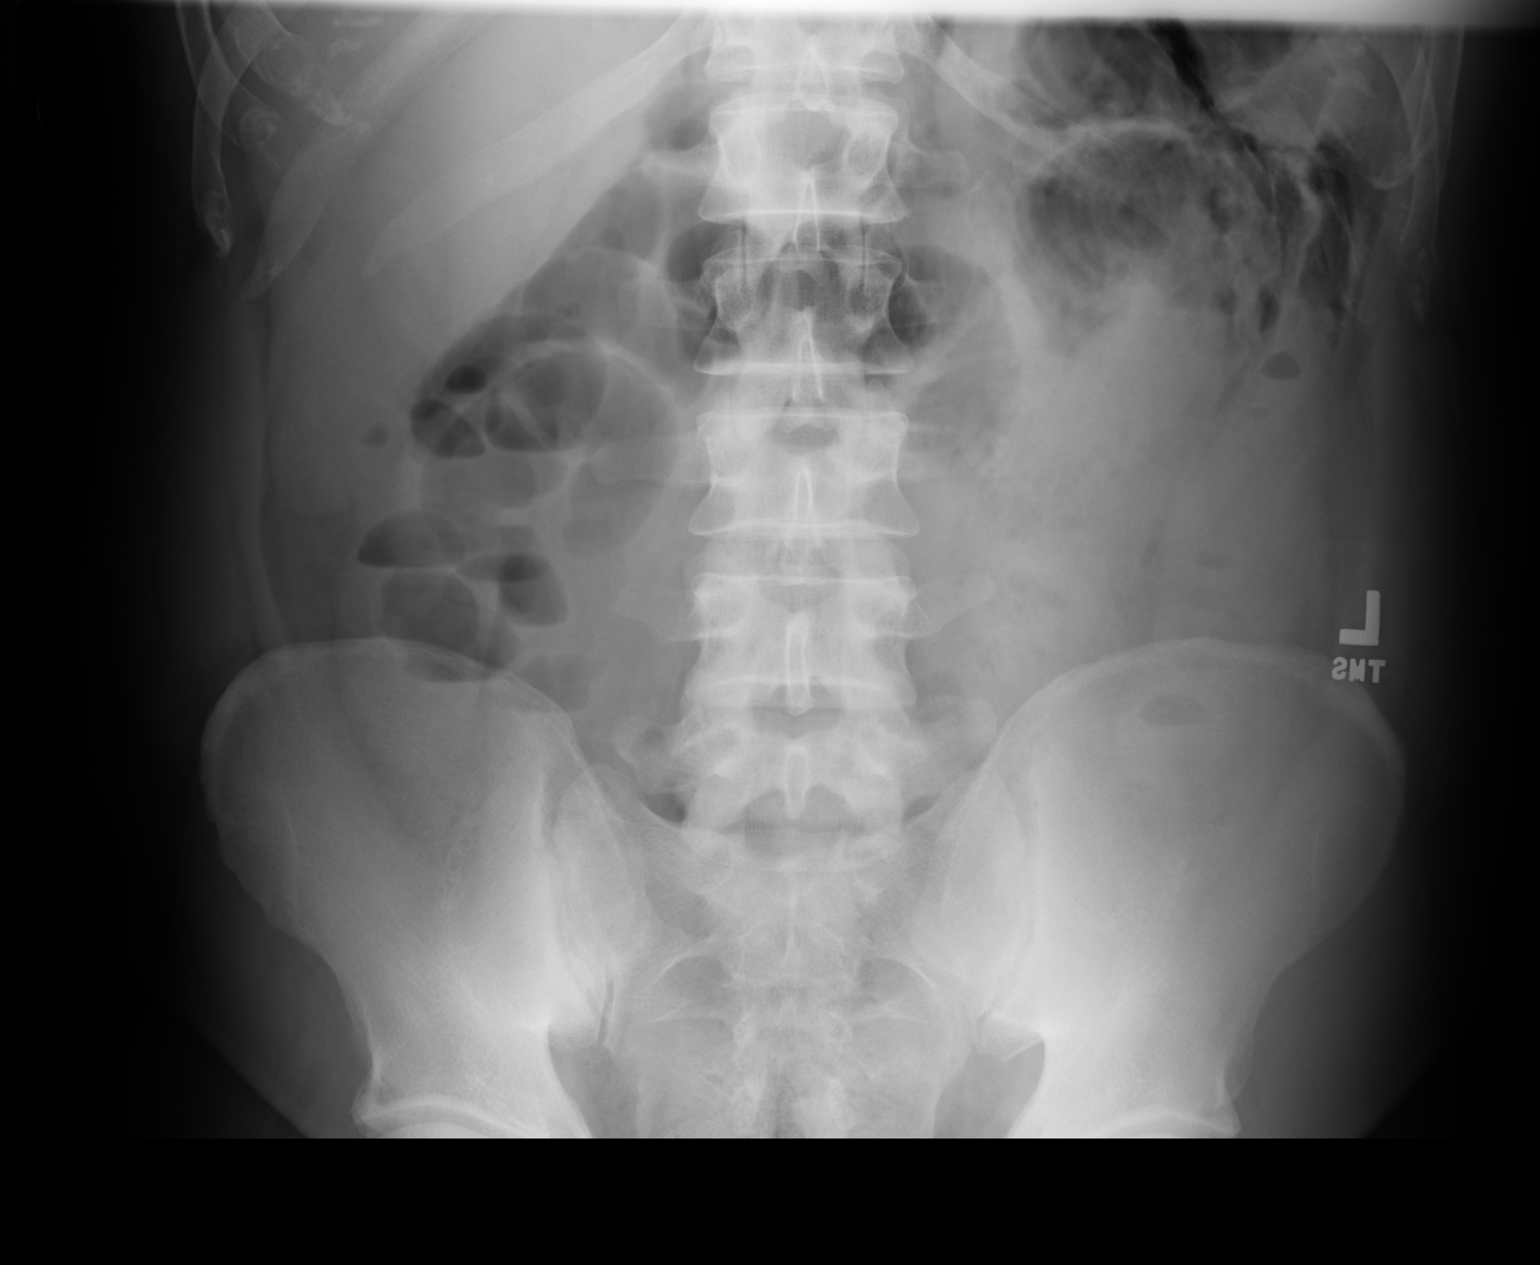

[2 of 2 positions shown; findings below may reference images not displayed]

PROCEDURE:     DXR - DXR ABDOMEN AP ONLY  - [DATE] [DATE]

RESULT:     An AP view of the abdomen obtained upright shows some
subdiaphragmatic free air.  There are nonspecific fluid levels visualized in
loops of small bowel in the RIGHT abdomen. Such findings are nonspecific and
can be secondary to gastroenteritis, enteritis or intraabdominal
inflammation.  No evidence for bowel obstruction is currently seen.  No
abnormal intraabdominal calcifications are identified.  The osseous
structures are normal in appearance.
IMPRESSION: There are nonspecific air-fluid levels in the RIGHT abdomen as noted above.

No subdiaphragmatic free air is seen.

## 2006-08-12 IMAGING — CR DG CHEST 1V
1 series · 1 of 1 positions shown · non-contrast
Comparison: none

REASON FOR EXAM: Abdominal pain
COMMENTS:

PROCEDURE:     DXR - DXR CHEST 1 VIEWAP OR PA  - [DATE] [DATE]
RESULT:     PA view of the chest was requested but PA and lateral views were
obtained. The lung fields are clear. No pneumonia, pneumothorax or pleural
effusion is seen. The heart size is normal.

[view not recorded]
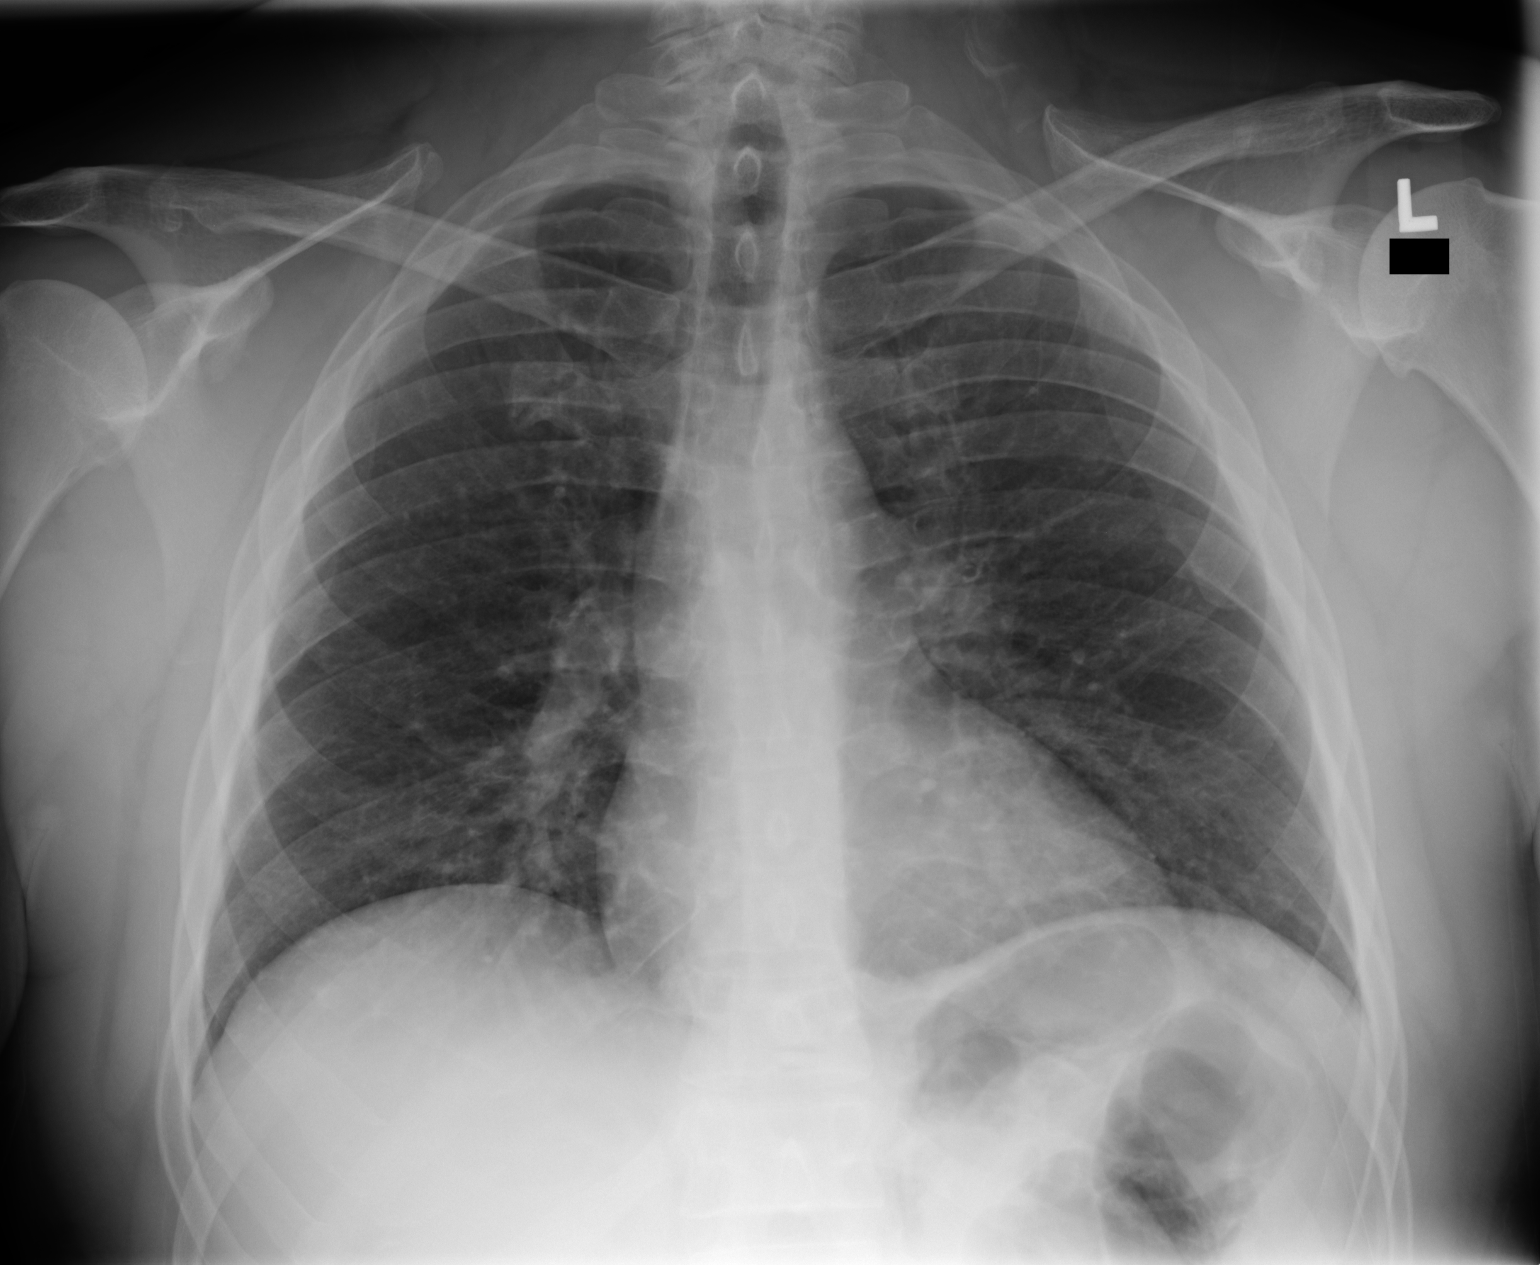

[1 of 1 positions shown; findings below may reference images not displayed]

IMPRESSION: No significant abnormalities are noted.

## 2006-08-13 IMAGING — CT CT ABD-PELV W/ CM
1 of 2 series · 16 of 32 positions shown, 20 images · non-contrast
Comparison: none

REASON FOR EXAM: Abdominal pain
COMMENTS:

PROCEDURE:     CT  - CT ABDOMEN / PELVIS  W  - [DATE] [DATE]
RESULT:     Emergent, noncontrast CT scan of abdomen and pelvis is performed
for abdominal pain.
The report was faxed to the Emergency Room.

[Series 2: abdomen · axial · 0.75mm/px · z∈[-352,+96]mm · 16 of 62 slices shown, 20 images]
[im 3/62  soft-tissue]
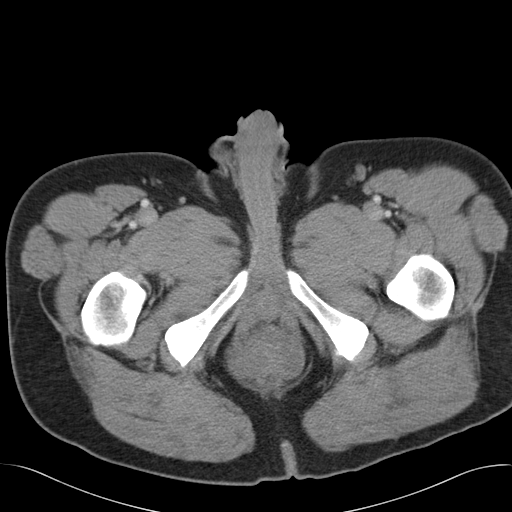
[im 3/62  bone]
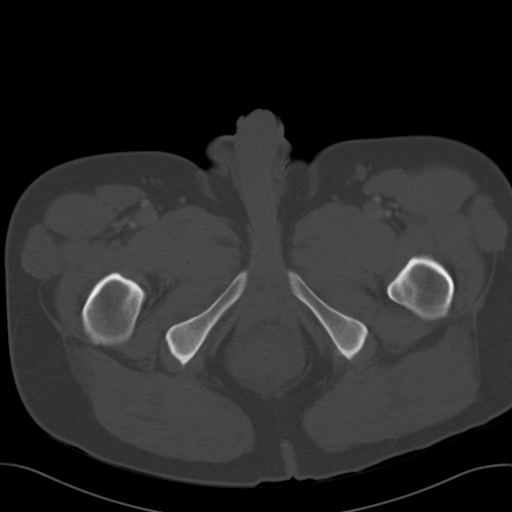
[im 8/62  soft-tissue]
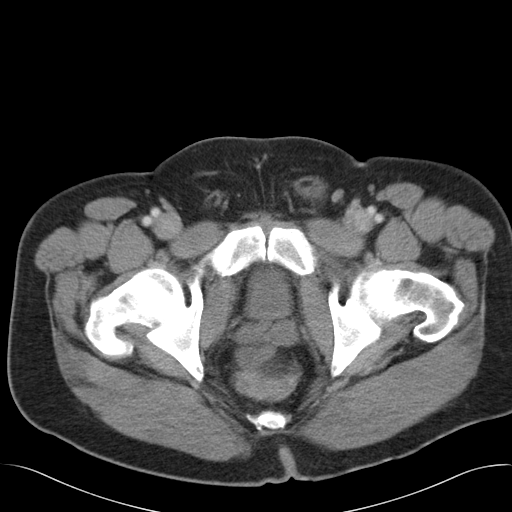
[im 13/62  soft-tissue]
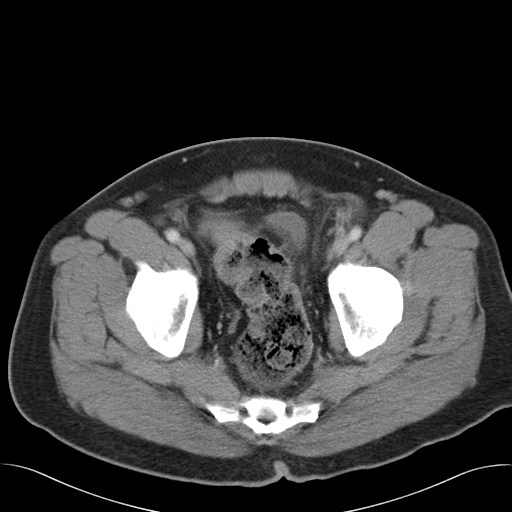
[im 16/62  soft-tissue]
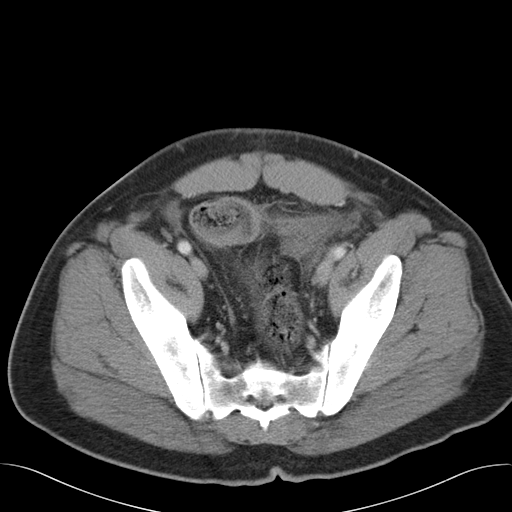
[im 21/62  soft-tissue]
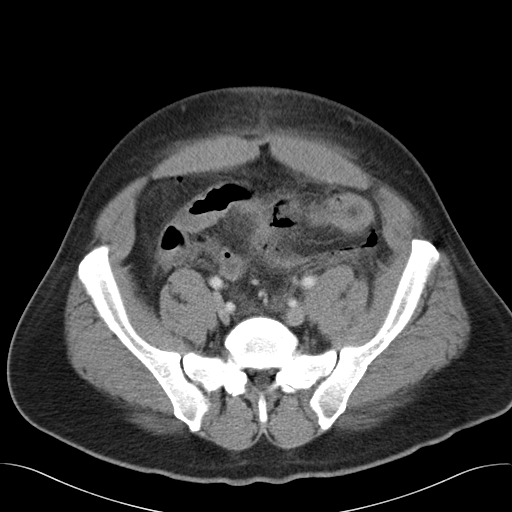
[im 26/62  soft-tissue]
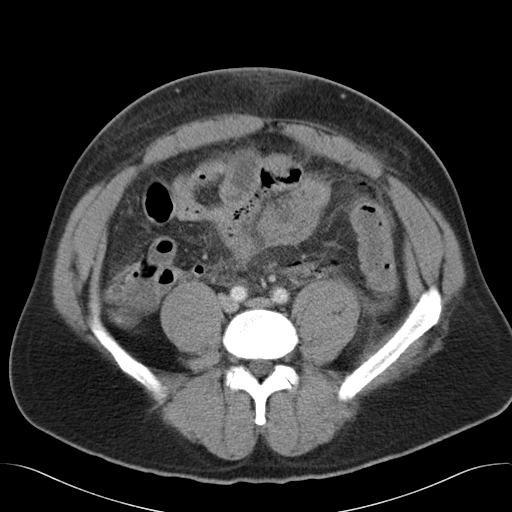
[im 28/62  soft-tissue]
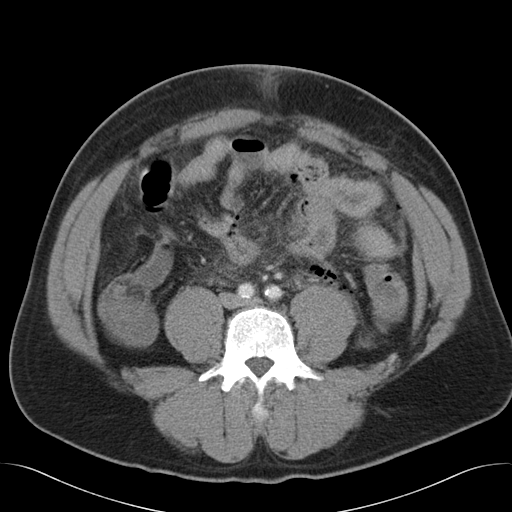
[im 34/62  soft-tissue]
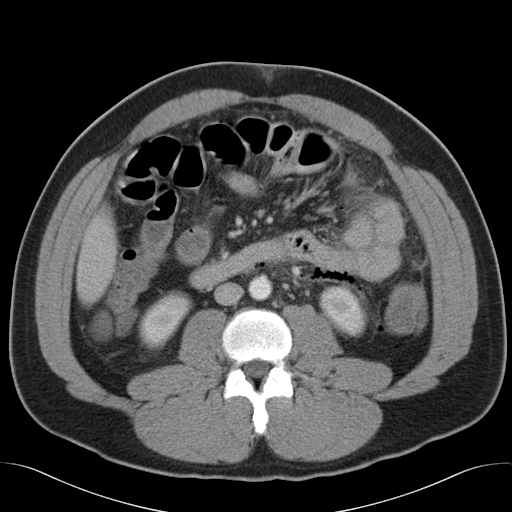
[im 36/62  soft-tissue]
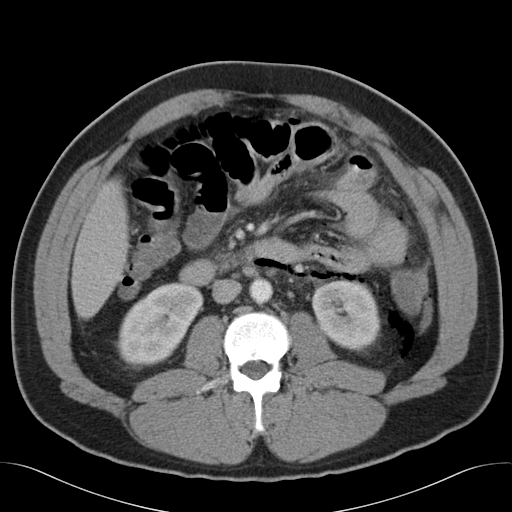
[im 36/62  bone]
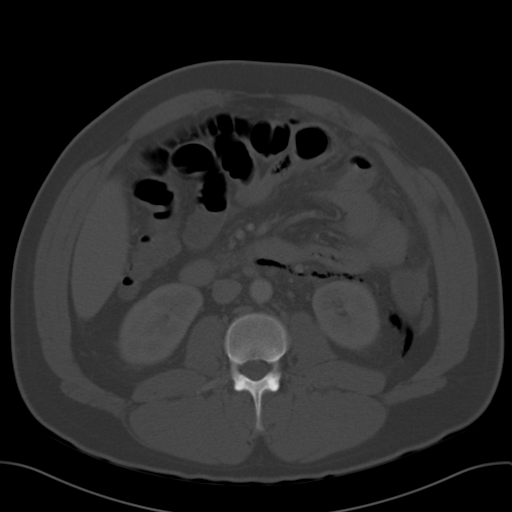
[im 41/62  soft-tissue]
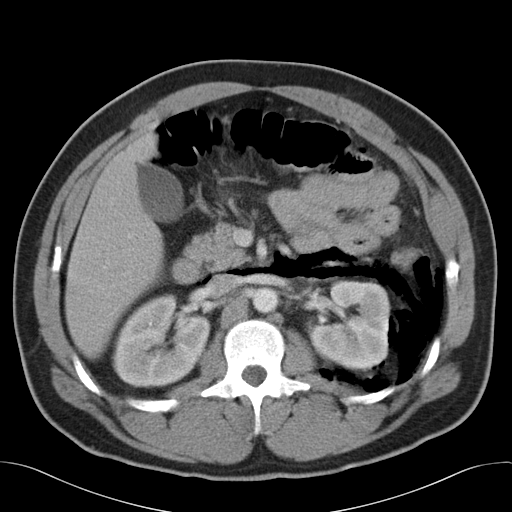
[im 46/62  soft-tissue]
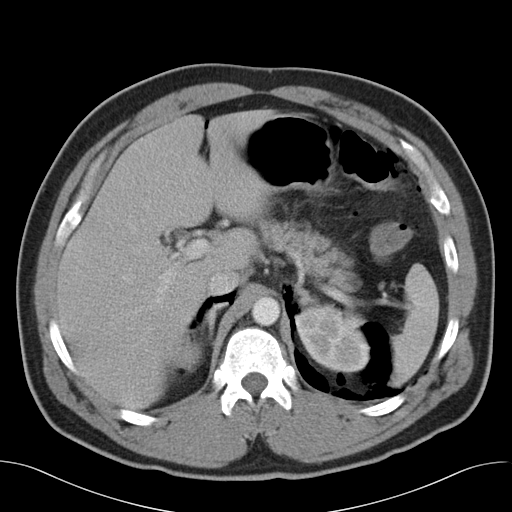
[im 49/62  soft-tissue]
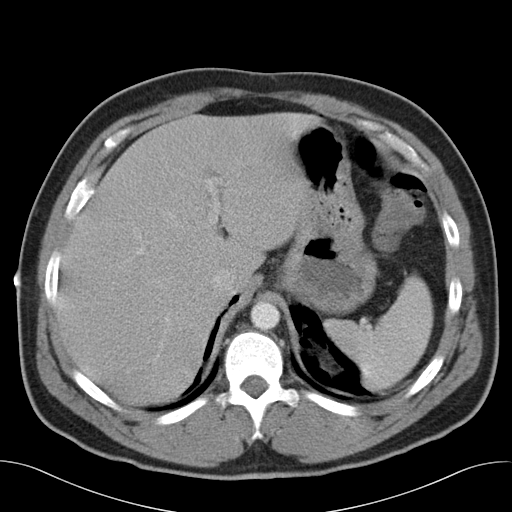
[im 51/62  lung]
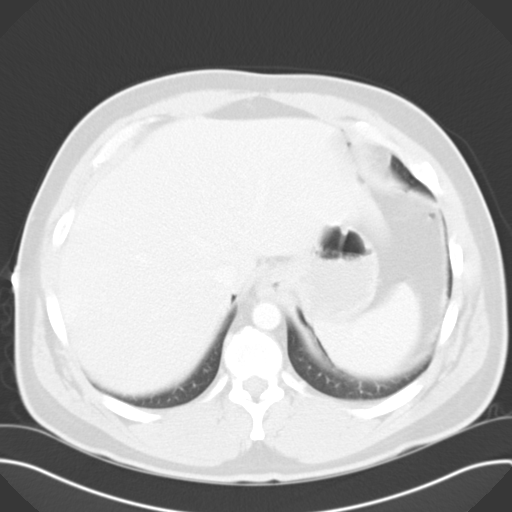
[im 54/62  soft-tissue]
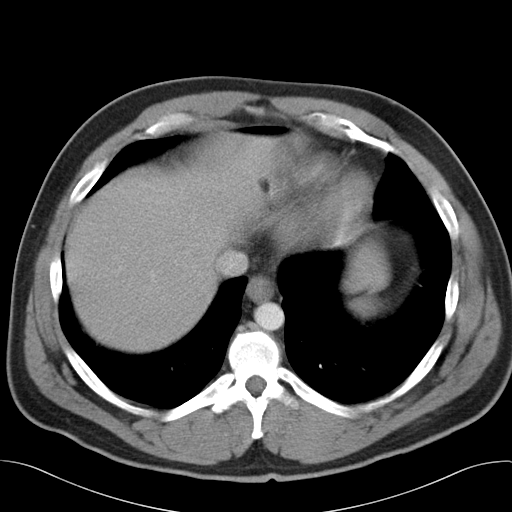
[im 54/62  lung]
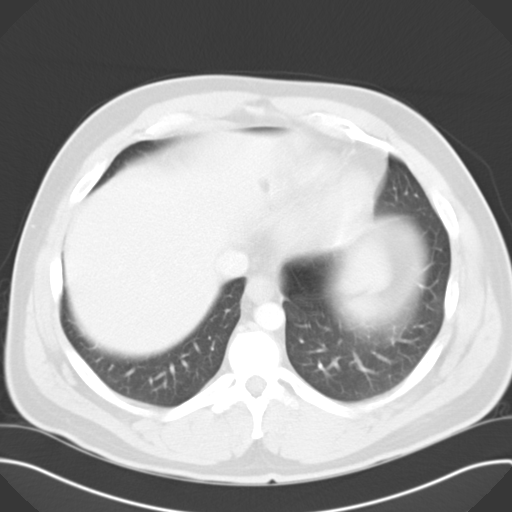
[im 56/62  lung]
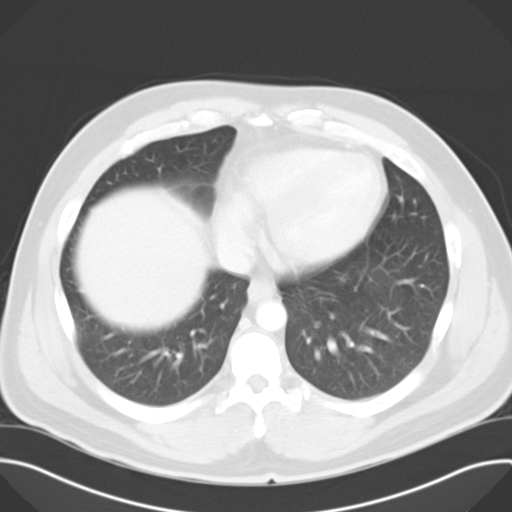
[im 59/62  soft-tissue]
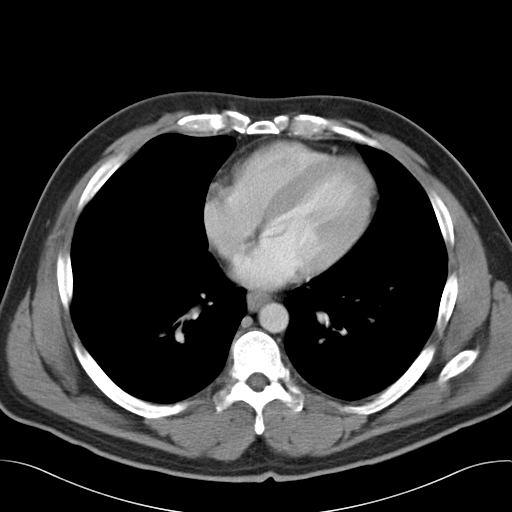
[im 59/62  lung]
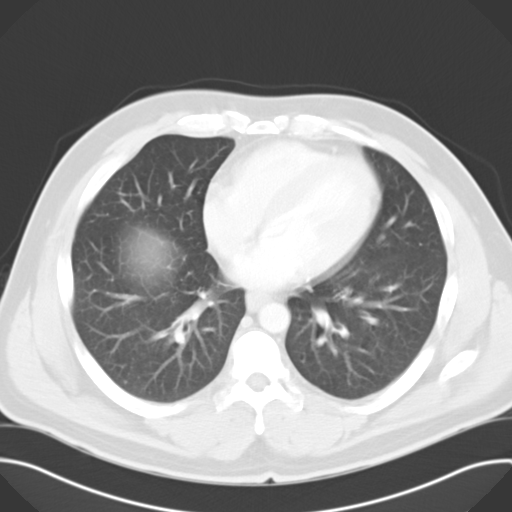

[16 of 32 positions shown; findings below may reference images not displayed]

FINDINGS: There is noted a large amount of retroperitoneal air mostly on the
LEFT and extending towards the RIGHT kidney. This may be secondary to bowel
perforation. No drainable fluid collections are noted. No major organ
abnormalities are seen. Both kidneys excrete the contrast material.
IMPRESSION: Retroperitoneal free air, mostly on the LEFT. The
possibility of bowel perforation should be of consideration.

## 2009-12-10 DIAGNOSIS — I609 Nontraumatic subarachnoid hemorrhage, unspecified: Secondary | ICD-10-CM

## 2009-12-10 HISTORY — DX: Nontraumatic subarachnoid hemorrhage, unspecified: I60.9

## 2010-01-26 DIAGNOSIS — I609 Nontraumatic subarachnoid hemorrhage, unspecified: Secondary | ICD-10-CM | POA: Insufficient documentation

## 2010-01-26 HISTORY — DX: Nontraumatic subarachnoid hemorrhage, unspecified: I60.9

## 2010-01-26 HISTORY — PX: CEREBRAL ANEURYSM REPAIR: SHX164

## 2010-03-07 ENCOUNTER — Encounter: Payer: Self-pay | Admitting: Neurological Surgery

## 2010-03-10 ENCOUNTER — Encounter: Payer: Self-pay | Admitting: Neurological Surgery

## 2010-04-09 ENCOUNTER — Encounter: Payer: Self-pay | Admitting: Neurological Surgery

## 2011-01-02 ENCOUNTER — Ambulatory Visit: Payer: Self-pay | Admitting: Neurology

## 2011-03-06 DIAGNOSIS — E669 Obesity, unspecified: Secondary | ICD-10-CM | POA: Insufficient documentation

## 2011-03-06 DIAGNOSIS — I10 Essential (primary) hypertension: Secondary | ICD-10-CM | POA: Insufficient documentation

## 2011-03-06 DIAGNOSIS — Z720 Tobacco use: Secondary | ICD-10-CM | POA: Insufficient documentation

## 2011-03-06 DIAGNOSIS — R6889 Other general symptoms and signs: Secondary | ICD-10-CM | POA: Insufficient documentation

## 2011-03-27 DIAGNOSIS — H4310 Vitreous hemorrhage, unspecified eye: Secondary | ICD-10-CM

## 2011-03-27 DIAGNOSIS — G3184 Mild cognitive impairment, so stated: Secondary | ICD-10-CM | POA: Insufficient documentation

## 2011-03-27 DIAGNOSIS — K859 Acute pancreatitis without necrosis or infection, unspecified: Secondary | ICD-10-CM | POA: Insufficient documentation

## 2011-03-27 DIAGNOSIS — F09 Unspecified mental disorder due to known physiological condition: Secondary | ICD-10-CM | POA: Insufficient documentation

## 2011-03-27 HISTORY — DX: Vitreous hemorrhage, unspecified eye: H43.10

## 2011-07-23 DIAGNOSIS — E119 Type 2 diabetes mellitus without complications: Secondary | ICD-10-CM | POA: Insufficient documentation

## 2011-09-03 DIAGNOSIS — G4710 Hypersomnia, unspecified: Secondary | ICD-10-CM | POA: Insufficient documentation

## 2011-09-25 ENCOUNTER — Encounter: Payer: Self-pay | Admitting: Nurse Practitioner

## 2011-09-25 ENCOUNTER — Encounter: Payer: Self-pay | Admitting: Cardiothoracic Surgery

## 2011-10-11 ENCOUNTER — Encounter: Payer: Self-pay | Admitting: Cardiothoracic Surgery

## 2011-10-11 ENCOUNTER — Encounter: Payer: Self-pay | Admitting: Nurse Practitioner

## 2011-11-10 ENCOUNTER — Encounter: Payer: Self-pay | Admitting: Cardiothoracic Surgery

## 2011-11-10 ENCOUNTER — Encounter: Payer: Self-pay | Admitting: Nurse Practitioner

## 2011-12-11 ENCOUNTER — Encounter: Payer: Self-pay | Admitting: Nurse Practitioner

## 2011-12-11 ENCOUNTER — Encounter: Payer: Self-pay | Admitting: Cardiothoracic Surgery

## 2011-12-11 DIAGNOSIS — G4733 Obstructive sleep apnea (adult) (pediatric): Secondary | ICD-10-CM

## 2011-12-11 HISTORY — DX: Obstructive sleep apnea (adult) (pediatric): G47.33

## 2011-12-19 ENCOUNTER — Ambulatory Visit: Payer: Self-pay | Admitting: Neurology

## 2012-01-01 ENCOUNTER — Ambulatory Visit: Payer: Self-pay | Admitting: Neurology

## 2012-05-01 ENCOUNTER — Ambulatory Visit: Payer: Self-pay

## 2012-06-26 ENCOUNTER — Emergency Department: Payer: Self-pay | Admitting: Emergency Medicine

## 2012-06-26 LAB — URINALYSIS, COMPLETE
Blood: NEGATIVE
Glucose,UR: NEGATIVE mg/dL (ref 0–75)
Leukocyte Esterase: NEGATIVE
Nitrite: NEGATIVE
Protein: 30
RBC,UR: <1 /HPF (ref 0–5)

## 2012-06-26 LAB — ETHANOL: Ethanol %: <0.003 % (ref 0.000–0.080)

## 2012-06-26 LAB — COMPREHENSIVE METABOLIC PANEL
Alkaline Phosphatase: 112 U/L (ref 50–136)
Anion Gap: 10 (ref 7–16)
BUN: 20 mg/dL — ABNORMAL HIGH (ref 7–18)
Bilirubin,Total: 1.3 mg/dL — ABNORMAL HIGH (ref 0.2–1.0)
Chloride: 106 mmol/L (ref 98–107)
Co2: 25 mmol/L (ref 21–32)
Creatinine: 1.44 mg/dL — ABNORMAL HIGH (ref 0.60–1.30)
EGFR (Non-African Amer.): 58 — ABNORMAL LOW
Potassium: 3.4 mmol/L — ABNORMAL LOW (ref 3.5–5.1)
SGPT (ALT): 22 U/L
Sodium: 141 mmol/L (ref 136–145)
Total Protein: 8.1 g/dL (ref 6.4–8.2)

## 2012-06-26 LAB — DRUG SCREEN, URINE
Amphetamines, Ur Screen: NEGATIVE (ref ?–1000)
Benzodiazepine, Ur Scrn: NEGATIVE (ref ?–200)
Cannabinoid 50 Ng, Ur ~~LOC~~: NEGATIVE (ref ?–50)
Cocaine Metabolite,Ur ~~LOC~~: NEGATIVE (ref ?–300)
Opiate, Ur Screen: POSITIVE (ref ?–300)
Phencyclidine (PCP) Ur S: NEGATIVE (ref ?–25)

## 2012-06-26 LAB — CBC
HCT: 46.7 % (ref 40.0–52.0)
HGB: 15.7 g/dL (ref 13.0–18.0)
MCHC: 33.7 g/dL (ref 32.0–36.0)
Platelet: 311 10*3/uL (ref 150–440)
RDW: 13.9 % (ref 11.5–14.5)
WBC: 12.1 10*3/uL — ABNORMAL HIGH (ref 3.8–10.6)

## 2012-08-19 ENCOUNTER — Encounter: Payer: Self-pay | Admitting: Cardiothoracic Surgery

## 2012-08-19 ENCOUNTER — Encounter: Payer: Self-pay | Admitting: Nurse Practitioner

## 2012-08-29 ENCOUNTER — Ambulatory Visit: Payer: Self-pay | Admitting: Surgery

## 2012-08-29 IMAGING — CT CT ABD-PELV W/ CM
1 of 2 series · 15 of 32 positions shown, 19 images · non-contrast
Comparison: none

REASON FOR EXAM: Labs 1st Oral and IV Generalized abdominal pain Hernia
wound issues Eval for...
COMMENTS:

[Series 2: abd 3mm w 3.0 i40f 3 · axial · 0.86mm/px · z∈[-1108,-676]mm · 15 of 157 slices shown, 19 images]
[im 7/157  soft-tissue]
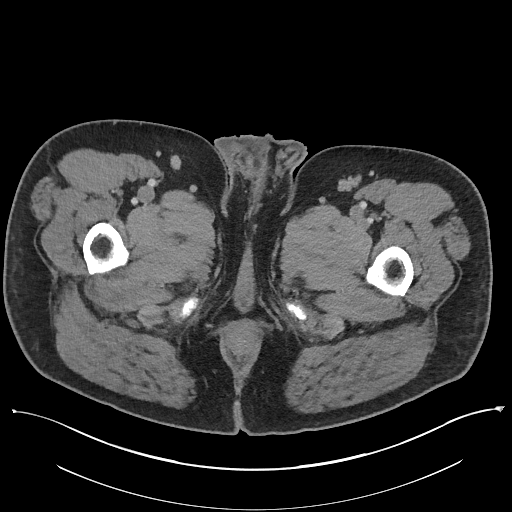
[im 7/157  bone]
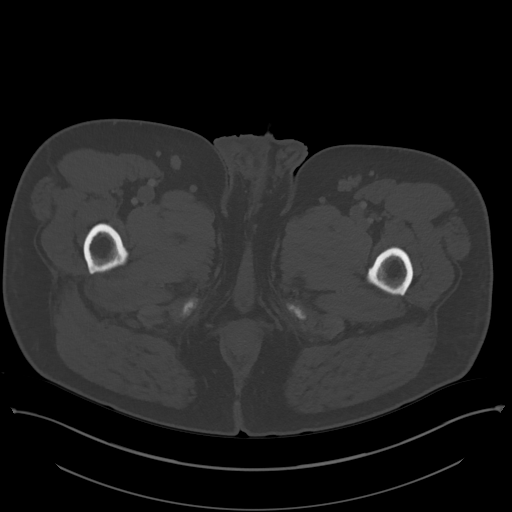
[im 19/157  soft-tissue]
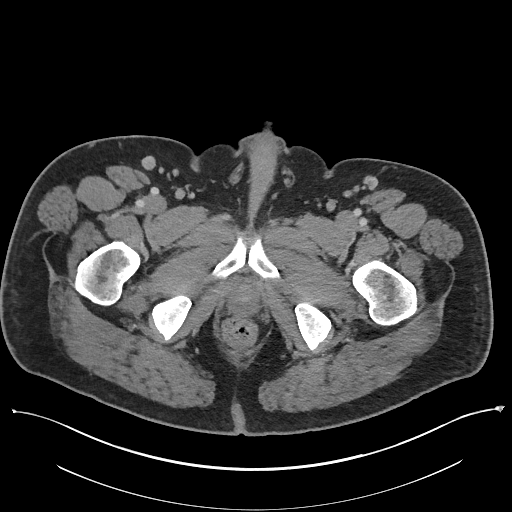
[im 31/157  soft-tissue]
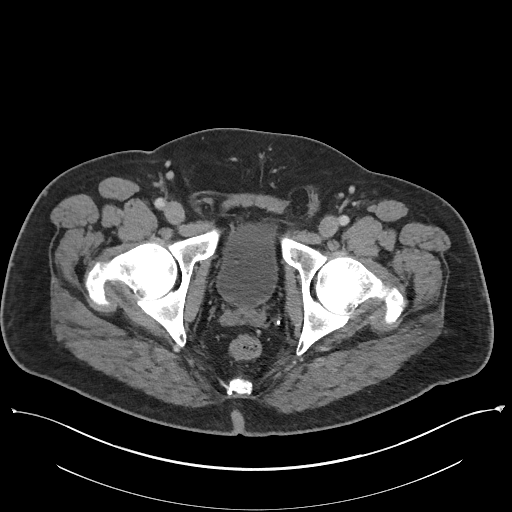
[im 43/157  soft-tissue]
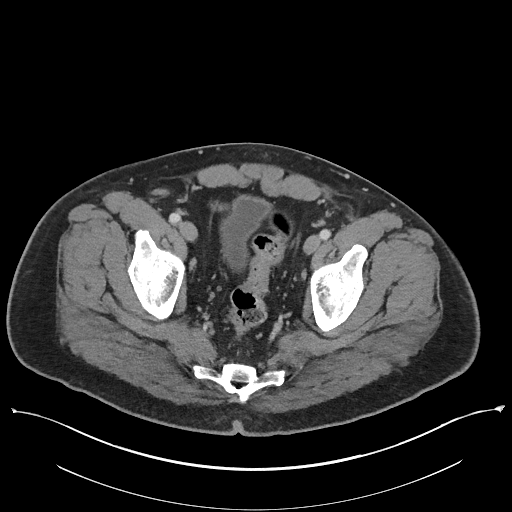
[im 55/157  soft-tissue]
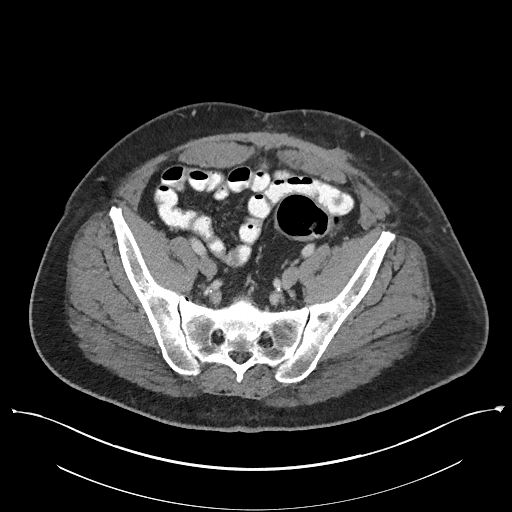
[im 67/157  soft-tissue]
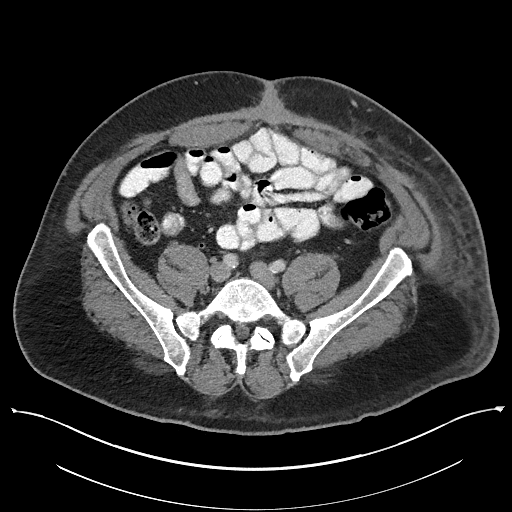
[im 79/157  soft-tissue]
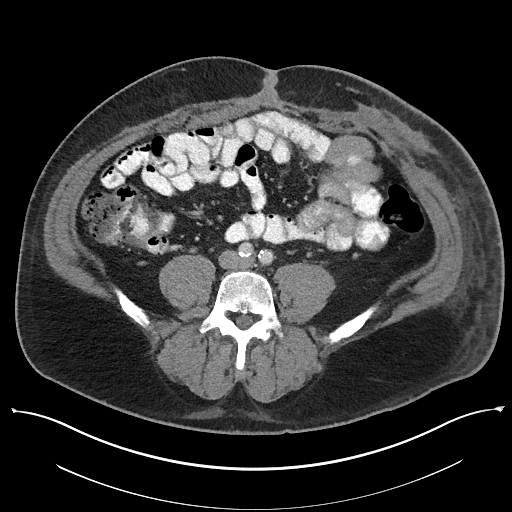
[im 91/157  soft-tissue]
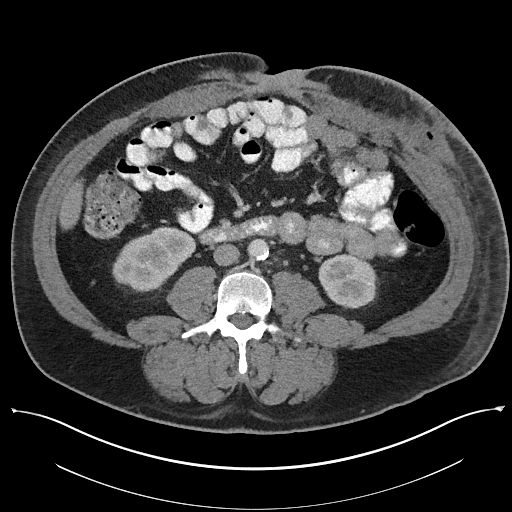
[im 103/157  soft-tissue]
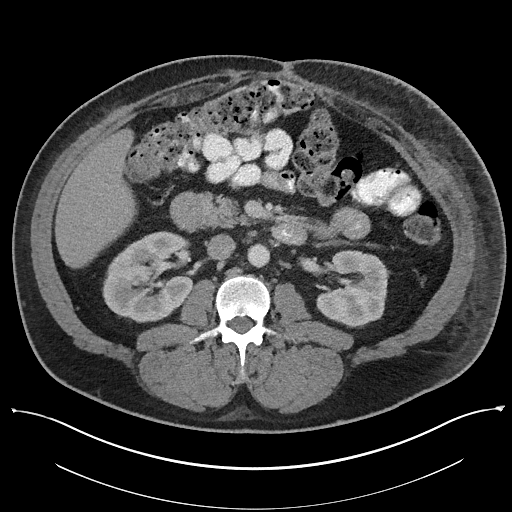
[im 103/157  bone]
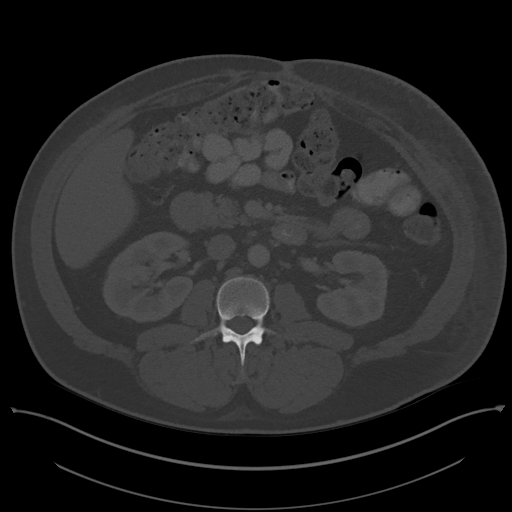
[im 115/157  soft-tissue]
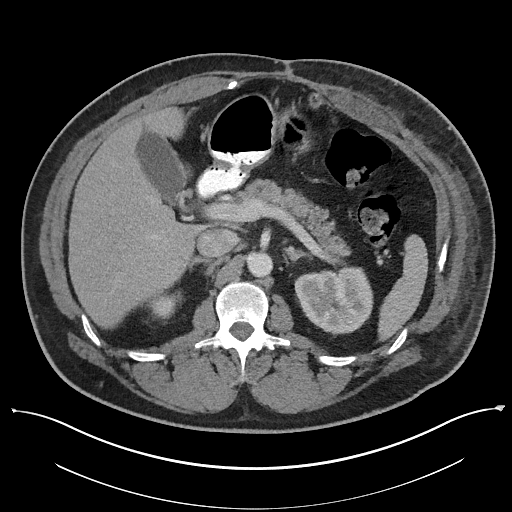
[im 127/157  soft-tissue]
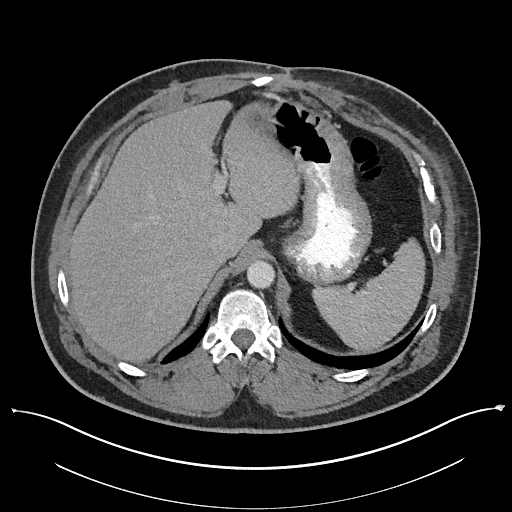
[im 133/157  lung]
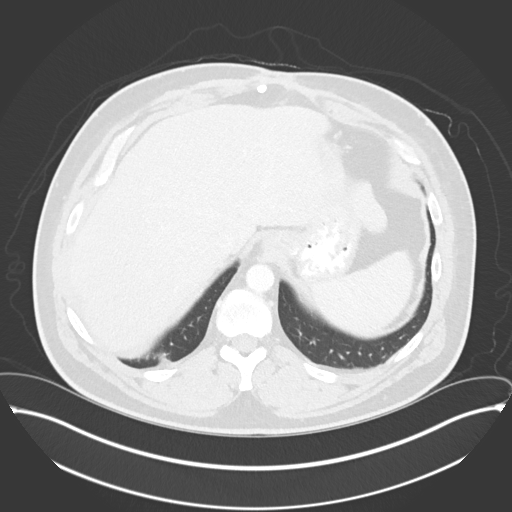
[im 139/157  soft-tissue]
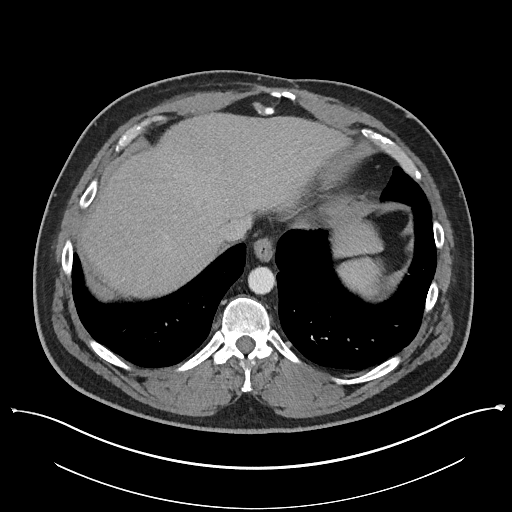
[im 139/157  lung]
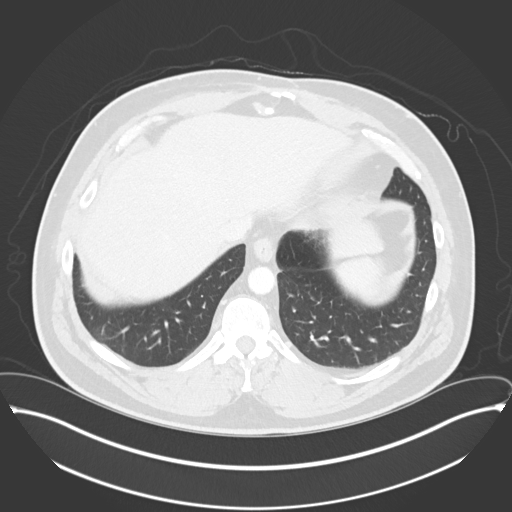
[im 145/157  lung]
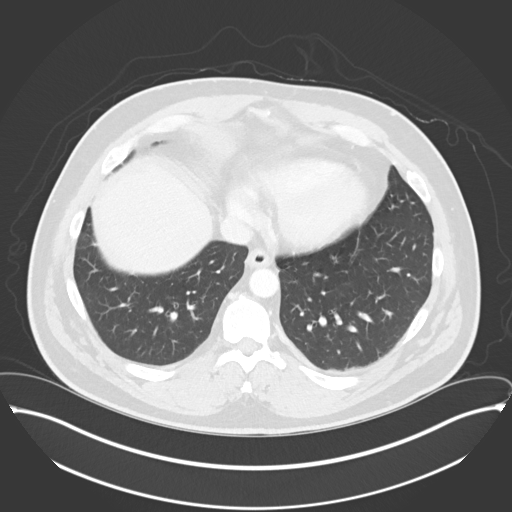
[im 151/157  soft-tissue]
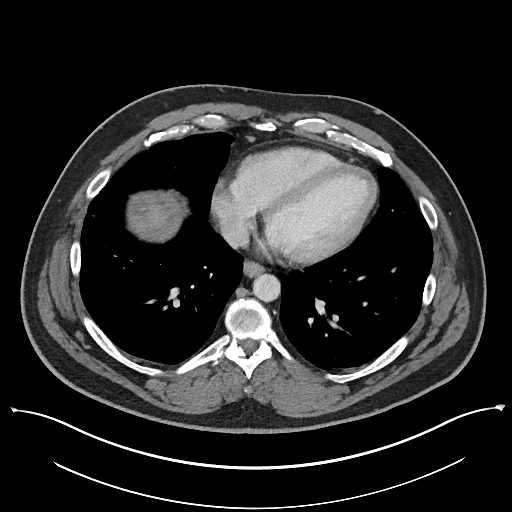
[im 151/157  lung]
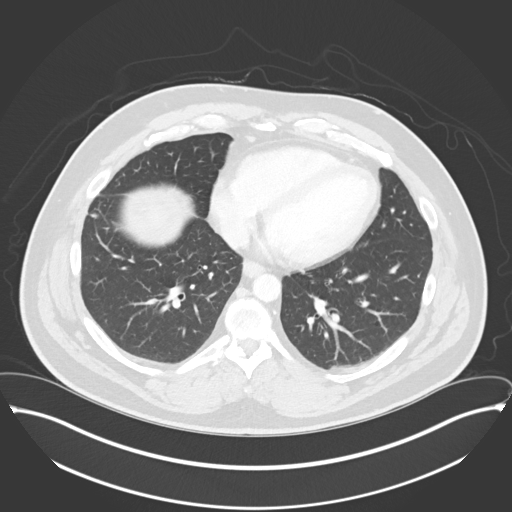

[15 of 32 positions shown; findings below may reference images not displayed]

PROCEDURE:     KCT - KCT ABDOMEN/PELVIS W  - [DATE] [DATE]

RESULT:     CT of the abdomen and pelvis is performed with 100 mL of
[15] iodinated intravenous contrast and oral contrast. Images are
reconstructed in the axial plane at 3.0 mm slice thickness. The patient has
a previous examination from [DATE]. There is also a previous exam
dated [DATE].

The lung bases appear clear. Postoperative changes are noted over the
anterior to anterolateral left mid abdomen to pelvic region consistent with
a recent history of hernia repair. Subcutaneous air is seen most prominently
in the area of images 64-68. On image 70, there appears to be possible
subcutaneous fluid collection measuring 5.27 cm x 2.4 cm which could
represent postoperative hematoma. An abscess is not excluded. No
intraperitoneal abnormal fluid collection or ascites is demonstrated.

The liver, gallbladder, appendix, adrenal glands, kidneys, pancreas and
spleen appear unremarkable. No abnormal bowel distention or bowel herniation
is evident. The urinary bladder is unremarkable. Small bowel, colon and
stomach appear to be within normal limits.
IMPRESSION: Postoperative changes in the subcutaneous tissues as described over the left
abdomen. Small fluid collection as discussed above which is nonspecific.

[REDACTED]

## 2013-06-23 DIAGNOSIS — I671 Cerebral aneurysm, nonruptured: Secondary | ICD-10-CM | POA: Insufficient documentation

## 2013-06-23 DIAGNOSIS — N529 Male erectile dysfunction, unspecified: Secondary | ICD-10-CM | POA: Insufficient documentation

## 2013-06-23 DIAGNOSIS — G8929 Other chronic pain: Secondary | ICD-10-CM | POA: Insufficient documentation

## 2013-06-23 DIAGNOSIS — R109 Unspecified abdominal pain: Secondary | ICD-10-CM | POA: Insufficient documentation

## 2013-06-23 DIAGNOSIS — G4733 Obstructive sleep apnea (adult) (pediatric): Secondary | ICD-10-CM | POA: Insufficient documentation

## 2013-07-13 DIAGNOSIS — L24A9 Irritant contact dermatitis due friction or contact with other specified body fluids: Secondary | ICD-10-CM | POA: Insufficient documentation

## 2013-07-13 DIAGNOSIS — T148XXA Other injury of unspecified body region, initial encounter: Secondary | ICD-10-CM | POA: Insufficient documentation

## 2013-12-05 ENCOUNTER — Ambulatory Visit: Payer: Self-pay | Admitting: Family Medicine

## 2014-01-05 DIAGNOSIS — T8579XA Infection and inflammatory reaction due to other internal prosthetic devices, implants and grafts, initial encounter: Secondary | ICD-10-CM | POA: Insufficient documentation

## 2014-02-01 DIAGNOSIS — L02211 Cutaneous abscess of abdominal wall: Secondary | ICD-10-CM | POA: Insufficient documentation

## 2014-06-29 ENCOUNTER — Ambulatory Visit: Payer: Self-pay | Admitting: Family Medicine

## 2014-06-29 IMAGING — US ABDOMEN ULTRASOUND
1 series · 14 of 25 positions shown · non-contrast
Comparison: CT scan of [DATE]

CLINICAL DATA: Elevated alkaline phosphatase levels.

EXAM:
ULTRASOUND ABDOMEN COMPLETE

[Series 1: abdomen ultrasound · 0.31mm/px · 14 of 88 slices shown]
[im 1/88]
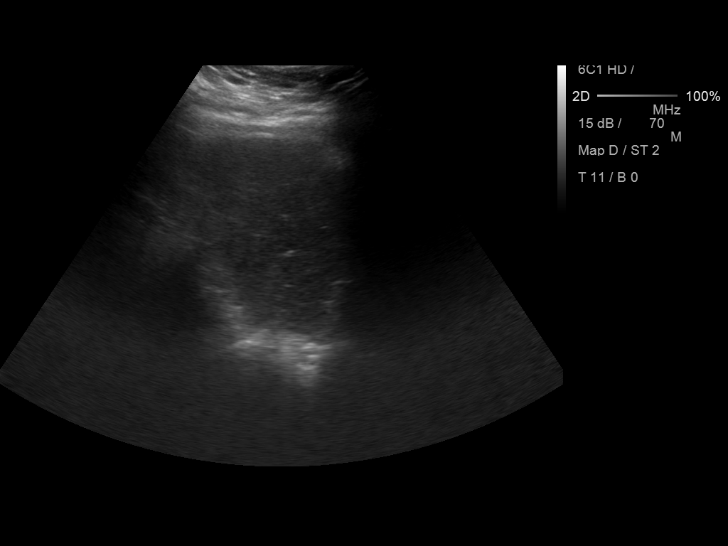
[im 8/88]
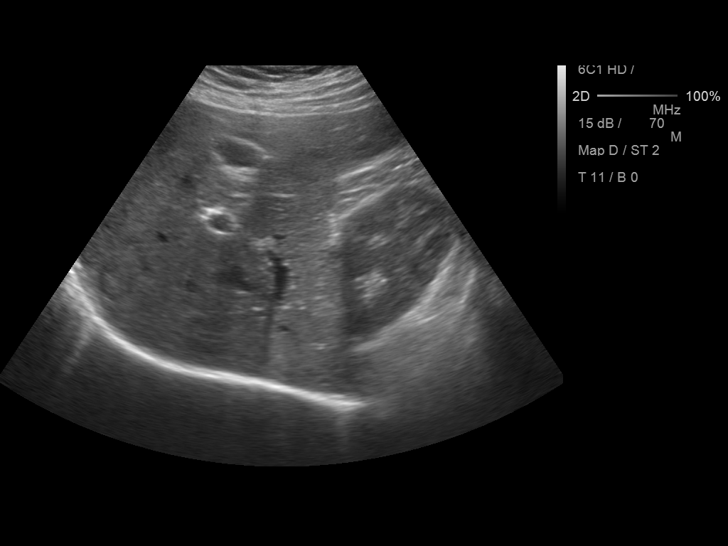
[im 15/88]
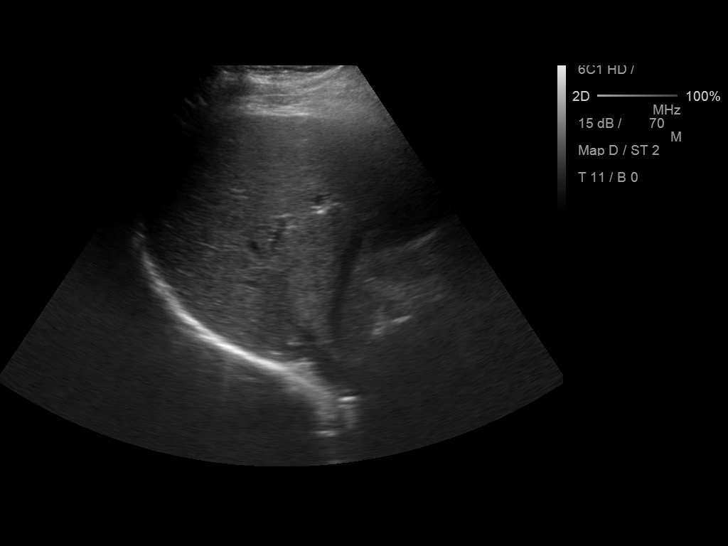
[im 22/88]
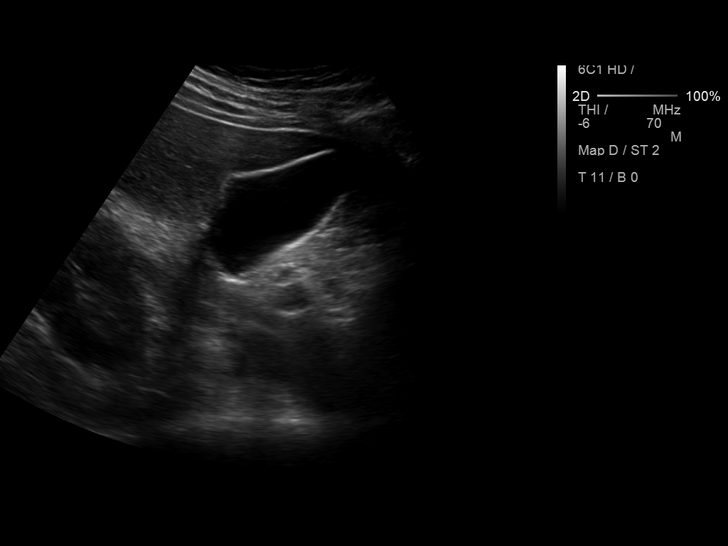
[im 30/88]
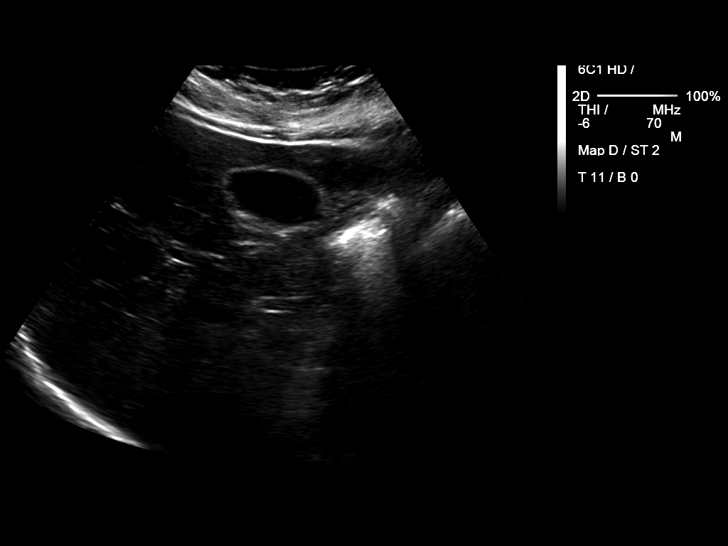
[im 33/88]
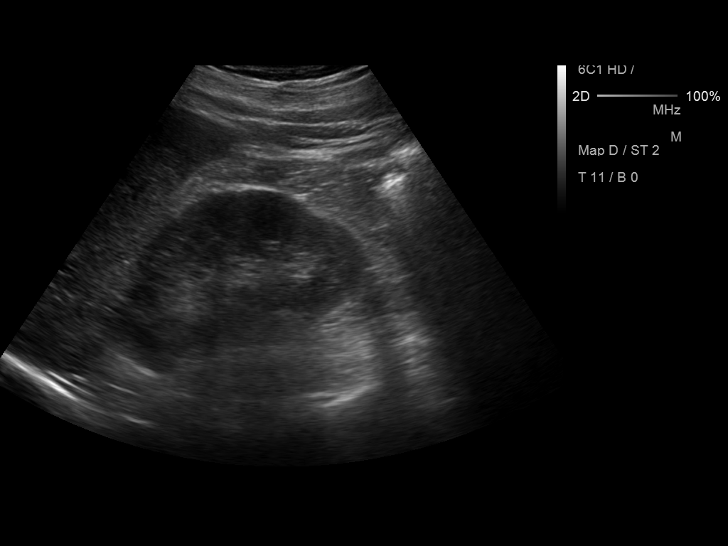
[im 40/88]
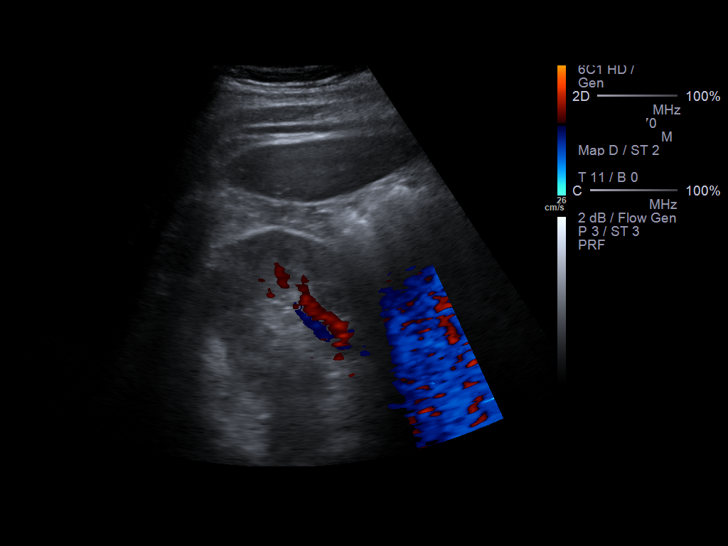
[im 48/88]
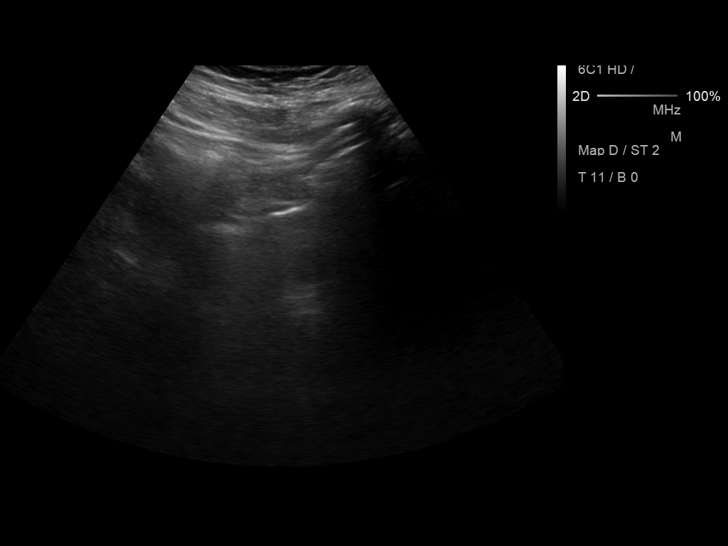
[im 55/88]
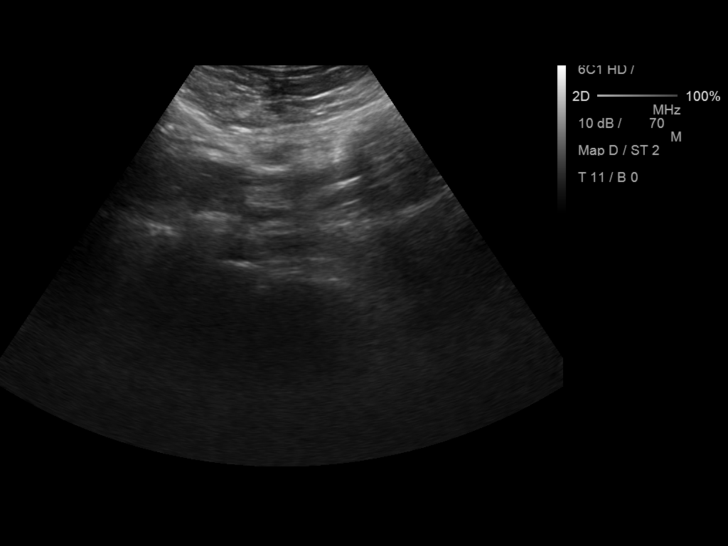
[im 59/88]
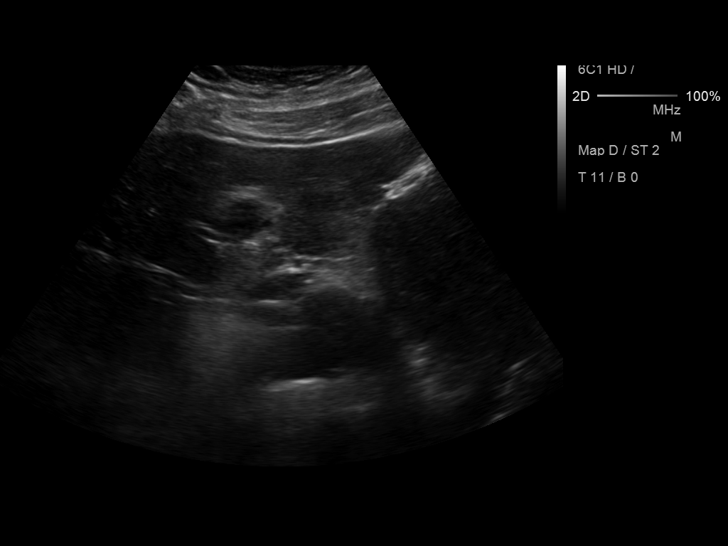
[im 66/88]
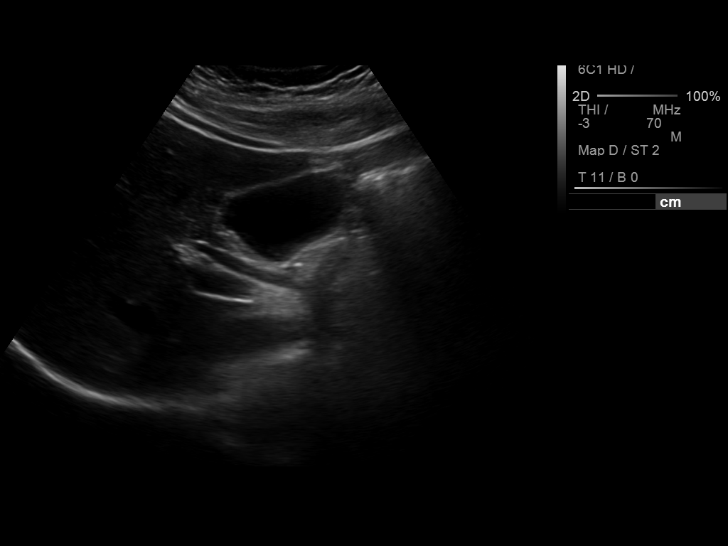
[im 73/88]
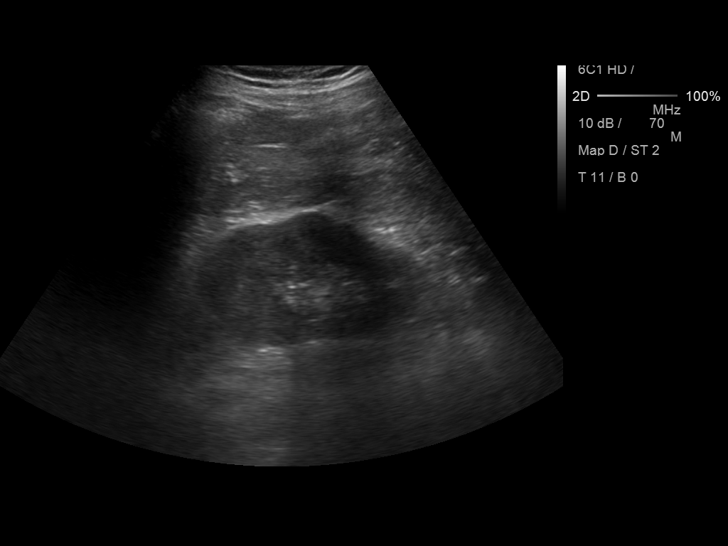
[im 80/88]
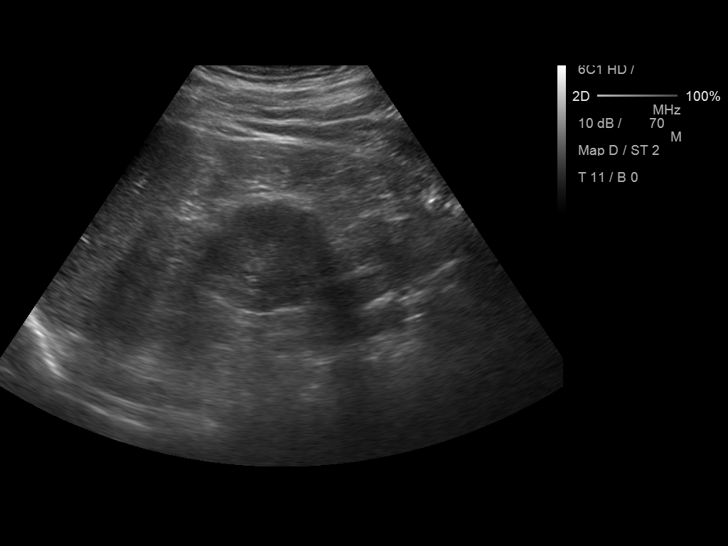
[im 88/88]
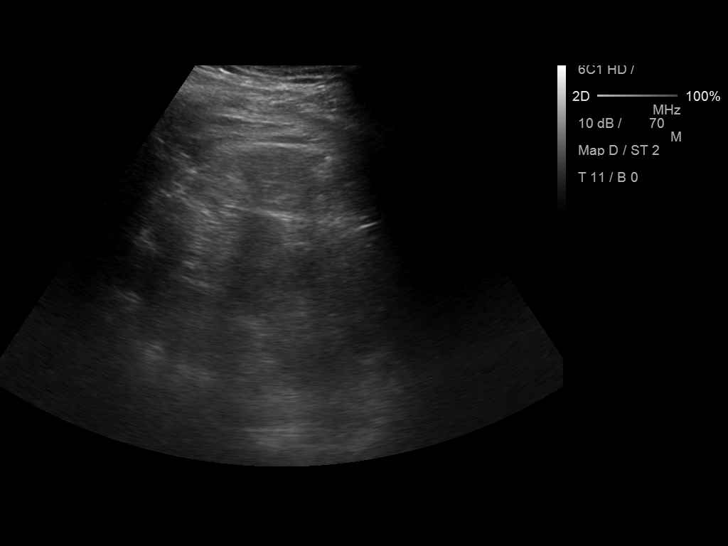

[14 of 25 positions shown; findings below may reference images not displayed]

FINDINGS: Gallbladder:

No gallstones or wall thickening visualized. No sonographic Murphy
sign noted.

Common bile duct:

Diameter: Measures 5.7 mm which is within normal limits.

Liver:

No focal lesion identified. Within normal limits in parenchymal
echogenicity.

IVC:

No abnormality visualized.

Pancreas:

Not visualized due to overlying scar.

Spleen:

Size and appearance within normal limits.

Right Kidney:

Length: 10.9 cm. Echogenicity within normal limits. No mass or
hydronephrosis visualized.

Left Kidney:

Length: 10.8 cm. Echogenicity within normal limits. No mass or
hydronephrosis visualized.

Abdominal aorta:

No aneurysm visualized.

Other findings:

None.
IMPRESSION: Pancreas not visualized.  No abnormality seen in the abdomen.

## 2014-08-12 ENCOUNTER — Ambulatory Visit: Payer: Self-pay

## 2014-08-12 LAB — COMPREHENSIVE METABOLIC PANEL
Albumin: 3.4 g/dL (ref 3.4–5.0)
Alkaline Phosphatase: 94 U/L
Anion Gap: 9 (ref 7–16)
BUN: 17 mg/dL (ref 7–18)
Bilirubin,Total: 0.8 mg/dL (ref 0.2–1.0)
Calcium, Total: 9.1 mg/dL (ref 8.5–10.1)
Chloride: 103 mmol/L (ref 98–107)
Co2: 27 mmol/L (ref 21–32)
Creatinine: 1.55 mg/dL — ABNORMAL HIGH (ref 0.60–1.30)
EGFR (African American): >60
EGFR (Non-African Amer.): 52 — ABNORMAL LOW
Glucose: 100 mg/dL — ABNORMAL HIGH (ref 65–99)
Osmolality: 279 (ref 275–301)
POTASSIUM: 3.9 mmol/L (ref 3.5–5.1)
SGOT(AST): 24 U/L (ref 15–37)
SGPT (ALT): 42 U/L
Sodium: 139 mmol/L (ref 136–145)
TOTAL PROTEIN: 6.6 g/dL (ref 6.4–8.2)

## 2014-08-12 LAB — CBC WITH DIFFERENTIAL/PLATELET
Basophil #: 0 10*3/uL (ref 0.0–0.1)
Basophil %: 0.6 %
EOS ABS: 0.4 10*3/uL (ref 0.0–0.7)
Eosinophil %: 5.2 %
HCT: 40.7 % (ref 40.0–52.0)
HGB: 13.4 g/dL (ref 13.0–18.0)
LYMPHS PCT: 35 %
Lymphocyte #: 2.6 10*3/uL (ref 1.0–3.6)
MCH: 28.7 pg (ref 26.0–34.0)
MCHC: 33 g/dL (ref 32.0–36.0)
MCV: 87 fL (ref 80–100)
Monocyte #: 0.6 x10 3/mm (ref 0.2–1.0)
Monocyte %: 7.8 %
Neutrophil #: 3.8 10*3/uL (ref 1.4–6.5)
Neutrophil %: 51.4 %
Platelet: 306 10*3/uL (ref 150–440)
RBC: 4.68 10*6/uL (ref 4.40–5.90)
RDW: 12.7 % (ref 11.5–14.5)
WBC: 7.5 10*3/uL (ref 3.8–10.6)

## 2014-08-12 LAB — AMYLASE: AMYLASE: 21 U/L — AB (ref 25–115)

## 2014-08-12 LAB — LIPASE, BLOOD: LIPASE: 60 U/L — AB (ref 73–393)

## 2015-01-31 DIAGNOSIS — E1140 Type 2 diabetes mellitus with diabetic neuropathy, unspecified: Secondary | ICD-10-CM | POA: Diagnosis not present

## 2015-01-31 DIAGNOSIS — F418 Other specified anxiety disorders: Secondary | ICD-10-CM | POA: Diagnosis not present

## 2015-01-31 DIAGNOSIS — F17290 Nicotine dependence, other tobacco product, uncomplicated: Secondary | ICD-10-CM | POA: Diagnosis not present

## 2015-01-31 DIAGNOSIS — I129 Hypertensive chronic kidney disease with stage 1 through stage 4 chronic kidney disease, or unspecified chronic kidney disease: Secondary | ICD-10-CM | POA: Diagnosis not present

## 2015-02-15 DIAGNOSIS — F329 Major depressive disorder, single episode, unspecified: Secondary | ICD-10-CM | POA: Diagnosis not present

## 2015-02-15 DIAGNOSIS — Z79899 Other long term (current) drug therapy: Secondary | ICD-10-CM | POA: Diagnosis not present

## 2015-02-15 DIAGNOSIS — Z79891 Long term (current) use of opiate analgesic: Secondary | ICD-10-CM | POA: Diagnosis not present

## 2015-02-15 DIAGNOSIS — R109 Unspecified abdominal pain: Secondary | ICD-10-CM | POA: Diagnosis not present

## 2015-02-15 DIAGNOSIS — G3184 Mild cognitive impairment, so stated: Secondary | ICD-10-CM | POA: Diagnosis not present

## 2015-02-15 DIAGNOSIS — M5414 Radiculopathy, thoracic region: Secondary | ICD-10-CM | POA: Diagnosis not present

## 2015-02-15 DIAGNOSIS — G894 Chronic pain syndrome: Secondary | ICD-10-CM | POA: Diagnosis not present

## 2015-02-15 DIAGNOSIS — G8929 Other chronic pain: Secondary | ICD-10-CM | POA: Diagnosis not present

## 2015-05-02 DIAGNOSIS — E1140 Type 2 diabetes mellitus with diabetic neuropathy, unspecified: Secondary | ICD-10-CM | POA: Diagnosis not present

## 2015-05-02 DIAGNOSIS — M25512 Pain in left shoulder: Secondary | ICD-10-CM | POA: Diagnosis not present

## 2015-05-02 DIAGNOSIS — F418 Other specified anxiety disorders: Secondary | ICD-10-CM | POA: Diagnosis not present

## 2015-05-02 DIAGNOSIS — R109 Unspecified abdominal pain: Secondary | ICD-10-CM | POA: Diagnosis not present

## 2015-05-02 DIAGNOSIS — I129 Hypertensive chronic kidney disease with stage 1 through stage 4 chronic kidney disease, or unspecified chronic kidney disease: Secondary | ICD-10-CM | POA: Diagnosis not present

## 2015-05-02 DIAGNOSIS — E785 Hyperlipidemia, unspecified: Secondary | ICD-10-CM | POA: Diagnosis not present

## 2015-05-04 ENCOUNTER — Other Ambulatory Visit: Payer: Self-pay | Admitting: Family Medicine

## 2015-05-04 DIAGNOSIS — R109 Unspecified abdominal pain: Secondary | ICD-10-CM

## 2015-05-10 ENCOUNTER — Ambulatory Visit: Payer: PRIVATE HEALTH INSURANCE

## 2015-05-16 ENCOUNTER — Ambulatory Visit (INDEPENDENT_AMBULATORY_CARE_PROVIDER_SITE_OTHER): Payer: Medicare Other | Admitting: Family Medicine

## 2015-05-16 ENCOUNTER — Encounter: Payer: Self-pay | Admitting: Family Medicine

## 2015-05-16 VITALS — BP 165/102 | HR 80 | Temp 99.3°F | Wt 221.0 lb

## 2015-05-16 DIAGNOSIS — R1013 Epigastric pain: Secondary | ICD-10-CM | POA: Diagnosis not present

## 2015-05-16 DIAGNOSIS — E7800 Pure hypercholesterolemia, unspecified: Secondary | ICD-10-CM | POA: Insufficient documentation

## 2015-05-16 DIAGNOSIS — F329 Major depressive disorder, single episode, unspecified: Secondary | ICD-10-CM

## 2015-05-16 DIAGNOSIS — I10 Essential (primary) hypertension: Secondary | ICD-10-CM

## 2015-05-16 DIAGNOSIS — I129 Hypertensive chronic kidney disease with stage 1 through stage 4 chronic kidney disease, or unspecified chronic kidney disease: Secondary | ICD-10-CM | POA: Insufficient documentation

## 2015-05-16 DIAGNOSIS — F419 Anxiety disorder, unspecified: Secondary | ICD-10-CM

## 2015-05-16 DIAGNOSIS — F32A Depression, unspecified: Secondary | ICD-10-CM | POA: Insufficient documentation

## 2015-05-16 DIAGNOSIS — G8929 Other chronic pain: Secondary | ICD-10-CM | POA: Diagnosis not present

## 2015-05-16 DIAGNOSIS — R51 Headache: Secondary | ICD-10-CM | POA: Diagnosis not present

## 2015-05-16 DIAGNOSIS — E119 Type 2 diabetes mellitus without complications: Secondary | ICD-10-CM | POA: Insufficient documentation

## 2015-05-16 DIAGNOSIS — N182 Chronic kidney disease, stage 2 (mild): Secondary | ICD-10-CM

## 2015-05-16 DIAGNOSIS — R519 Headache, unspecified: Secondary | ICD-10-CM | POA: Insufficient documentation

## 2015-05-16 DIAGNOSIS — Z720 Tobacco use: Secondary | ICD-10-CM

## 2015-05-16 MED ORDER — LISINOPRIL 20 MG PO TABS: 30.0000 mg | ORAL_TABLET | Freq: Every day | ORAL | Status: DC

## 2015-05-16 MED ORDER — AMLODIPINE BESYLATE 10 MG PO TABS: 10.0000 mg | ORAL_TABLET | Freq: Every day | ORAL | Status: DC

## 2015-05-16 NOTE — Assessment & Plan Note (Signed)
History of brain aneurysm; chronic headaches; refer at patient request back to neurology for evaluation and treatment

## 2015-05-16 NOTE — Assessment & Plan Note (Signed)
Patient was encouraged to quit smoking; he is not ready to quit at this time; I am here to help if/when that day comes

## 2015-05-16 NOTE — Assessment & Plan Note (Signed)
History of surgery, healing by secondary intention; he did not go for the [place]; reviewed LFTs and amylase and lipase with him (drawn last visit); negative H pylori testing; he requests referral to gastroenterologist, entered per his request

## 2015-05-16 NOTE — Assessment & Plan Note (Signed)
Increase amlodipine from 5 mg to 10 mg daily Cut down on saturated fats, sodium, try to follow DASH guidelines Return to see CMA in 2 weeks for blood pressure recheck and BMP Goal systolic less than [id number]

## 2015-05-16 NOTE — Progress Notes (Signed)
Patient ID: Derek Binet., male   DOB: 11-May-1966, 49 y.o.   MRN: [id number]   Subjective:   Derek Binet. is a 49 y.o. male for follow-up of several things  Hypertension No decongestants Smoking -- last cigarette was just before walking in No alcohol Some stress in his life Taking blood pressure as prescribed  Diabetes mellitus, type 2 Prob:  Diabetes mellitus type 2  Checking sugars?  no Diabetes-associated complications:  none [id number] in May was 5.6  Abdominal pain, better than last visit; worse after eating though; he had negative H pylori, normal bili, normal SGOT and SGPT, amylase and lipase were not elevated; he did not go for his [place]; he would like to see a GI doctor; he is on PPI  High cholesterol; reviewed last lipid panel which showed total chol 203, LDL 146, HDL 29, TG 138; he was just recently started on low dose statin  Mildly elevated creatinine; we reviewed the graph showing slow increase over the years for Cr and simultaneous decline in GFR; he does not use any NSAIDs and takes his ACE-I  Past Medical History  Diagnosis Date  . MRSA (methicillin resistant Staphylococcus aureus)   . Emphysema of lung     right sided  . Asthma   . Sleep apnea     sleep study 2013  . Hypertension   . Diabetes mellitus without complication   . GERD (gastroesophageal reflux disease)   . Anxiety   . Depression   . Panic disorder   . Acute pancreatitis   . Abdominal abscess   . Perforated bowel 2007    Tempoary Colostomy Bag, Skin Graft for Abd wound  . Back pain   . Obesity   . Subarachnoid hemorrhage 2011    coil placed  . Mild cognitive impairment    Past Surgical History  Procedure Laterality Date  . Colon surgery  2007    colostomy bag placed s/p perforated bowel   Family History  Problem Relation Age of Onset  . Arthritis Mother   . Asthma Mother   . Diabetes Mother   . Heart disease Mother   . Hyperlipidemia Mother   . Hypertension Mother   .  Kidney disease Mother   . Thyroid disease Mother   . Lung disease Mother   . Diabetes Father   . Heart disease Father   . Arthritis Sister   . Asthma Sister   . Hyperlipidemia Sister   . Hypertension Sister   . Lung disease Sister   . Hyperlipidemia Brother   . Hypertension Brother    History  Substance Use Topics  . Smoking status: Current Every Day Smoker -- 1.00 packs/day  . Smokeless tobacco: Never Used  . Alcohol Use: No   Current Outpatient Prescriptions on File Prior to Visit  Medication Sig Dispense Refill  . albuterol (PROVENTIL HFA;VENTOLIN HFA) 108 (90 BASE) MCG/ACT inhaler Inhale into the lungs every 6 (six) hours as needed for wheezing or shortness of breath.    Marland Kitchen aspirin EC 81 MG tablet Take 81 mg by mouth daily.    . budesonide-formoterol (SYMBICORT) 80-4.5 MCG/ACT inhaler Inhale 2 puffs into the lungs 2 (two) times daily.    . Buprenorphine 15 MCG/HR PTWK Place onto the skin. One patch weekly    . fluticasone (FLONASE) 50 MCG/ACT nasal spray Place into both nostrils daily.    Marland Kitchen glucose blood test strip 1 each by Other route as needed for other. Use as instructed    .  hydrOXYzine (VISTARIL) 50 MG capsule Take 50 mg by mouth every 6 (six) hours as needed.    Marland Kitchen omeprazole (PRILOSEC) 40 MG capsule Take 40 mg by mouth daily.    . sertraline (ZOLOFT) 100 MG tablet Take 100 mg by mouth daily.    . traMADol (ULTRAM) 50 MG tablet Take by mouth 4 (four) times daily. Take 2 tablets 4 times a day as needed     No current facility-administered medications on file prior to visit.   Allergies  Allergen Reactions  . Avelox [Moxifloxacin Hcl In Nacl]     Muscle pain  . Dilaudid [Hydromorphone Hcl] Hives  . Doxycycline Itching  . Vancomycin     Renal insufficiency   ------------- Review of Systems  Constitutional: Positive for malaise/fatigue (just sometimes). Negative for fever and chills.  Cardiovascular: Negative for leg swelling.  Gastrointestinal: Positive for  abdominal pain (all across the upper abdomen) and constipation. Negative for nausea, vomiting, diarrhea, blood in stool and melena.  Skin: Negative for rash.  Neurological: Positive for headaches (chronic, comes and goes, no known triggers, saw headache doctor). Negative for weakness.       Chronic headaches; hx of brain aneurysm  Endo/Heme/Allergies: Negative for polydipsia.  Psychiatric/Behavioral: The patient is nervous/anxious.     Lab Results  Component Value Date   WBC 7.5 08/12/2014   HGB 13.4 08/12/2014   HCT 40.7 08/12/2014   PLT 306 08/12/2014   GLUCOSE 100* 08/12/2014   ALT 42 08/12/2014   AST 24 08/12/2014   NA 139 08/12/2014   K 3.9 08/12/2014   CL 103 08/12/2014   CREATININE 1.55* 08/12/2014   BUN 17 08/12/2014   CO2 27 08/12/2014    Objective:   Filed Vitals:   05/16/15 0941 05/16/15 1015  BP: 183/113 165/102  Pulse: 84 80  Temp: 99.3 F (37.4 C)   Weight: 221 lb (100.245 kg)   SpO2: 97%    Body mass index is 34.61 kg/(m^2). Physical Exam  Constitutional: He appears well-developed and well-nourished. No distress.  HENT:  Head: Normocephalic and atraumatic.  Right Ear: External ear normal.  Left Ear: External ear normal.  Eyes: EOM are normal. Right eye exhibits no discharge. Left eye exhibits no discharge. No scleral icterus.  Neck: Normal range of motion. Neck supple. No thyromegaly present.  Cardiovascular: Normal rate, regular rhythm and normal heart sounds.   No murmur heard. Pulses:      Dorsalis pedis pulses are 2+ on the right side, and 2+ on the left side.  Pulmonary/Chest: Effort normal and breath sounds normal. No respiratory distress. He has no wheezes.  Abdominal: Soft. Bowel sounds are normal. He exhibits no distension. There is no tenderness. There is no guarding. No hernia.  Surgical scars across abdomen, c/w previous procedure, healing by secondary intention  Musculoskeletal: He exhibits no edema.       Right foot: There is normal  range of motion and no deformity.       Left foot: There is normal range of motion and no deformity.  Feet:  Right Foot:  Skin Integrity: Positive for callus (medial great toe).  Left Foot:  Skin Integrity: Positive for callus (medial great toe).  Skin: Skin is warm and dry. No rash noted.  Psychiatric: He has a normal mood and affect. His behavior is normal.  Nursing note and vitals reviewed.   Assessment/Plan:   Problem List Items Addressed This Visit      Cardiovascular and Mediastinum   Benign  hypertension    Increase amlodipine from 5 mg to 10 mg daily Cut down on saturated fats, sodium, try to follow DASH guidelines Return to see CMA in 2 weeks for blood pressure recheck and BMP Goal systolic less than [id number]      Relevant Medications   amLODipine (NORVASC) 10 MG tablet   lisinopril (PRINIVIL,ZESTRIL) 20 MG tablet   Other Relevant Orders   Basic Metabolic Panel (BMET)     Endocrine   Type 2 diabetes mellitus without complication    Foot exam by MD today, patient to check feet every night Offered referral to podiatry for hte callouses and thickened nails; he declined but is welcome to call if he changes his mind Last A1C reviewed, excellent, 5.6 in May; next due in 6 months      Relevant Medications   lisinopril (PRINIVIL,ZESTRIL) 20 MG tablet   Other Relevant Orders   Comprehensive metabolic panel   HgB [id number]     Genitourinary   CKD (chronic kidney disease) stage 2, GFR 60-89 ml/min    Patient was encouraged to avoid NSAIDs; we'll work on getting his BP under better control; return in 2 weeks for recheck BP but also recheck BMP to ensure renal function does not drop with increase in meds        Other   High blood cholesterol    Reviewed last lipid panel; he has just recently started on low dose statin; monitor lipids and sgpt at f/u in 3 months; try to avoid bacon, sausage, other foods containing lots of saturated fats      Relevant Medications   amLODipine  (NORVASC) 10 MG tablet   lisinopril (PRINIVIL,ZESTRIL) 20 MG tablet   Other Relevant Orders   Lipid panel   Depression - Primary    Continue current meds; I have tried to get referrals started in the past for him, and I'm not sure why he isn't established with a psychiatrist, but will enter this again; patient agrees to go      Relevant Orders   Ambulatory referral to Psychiatry   Chronic anxiety    Continue current meds; I have tried to get referrals started in the past for him, and I'm not sure why he isn't established with a psychiatrist, but will enter this again; patient agrees to go      Relevant Orders   Ambulatory referral to Psychiatry   Persistent headaches    History of brain aneurysm; chronic headaches; refer at patient request back to neurology for evaluation and treatment      Relevant Medications   amLODipine (NORVASC) 10 MG tablet   Other Relevant Orders   Ambulatory referral to Neurology   Abdominal pain, chronic, epigastric    History of surgery, healing by secondary intention; he did not go for the [place]; reviewed LFTs and amylase and lipase with him (drawn last visit); negative H pylori testing; he requests referral to gastroenterologist, entered per his request      Relevant Orders   Ambulatory referral to Gastroenterology   Tobacco abuse    Patient was encouraged to quit smoking; he is not ready to quit at this time; I am here to help if/when that day comes          Meds ordered this encounter  Medications  . amLODipine (NORVASC) 10 MG tablet    Sig: Take 1 tablet (10 mg total) by mouth daily.    Dispense:  90 tablet    Refill:  3  .  lisinopril (PRINIVIL,ZESTRIL) 20 MG tablet    Sig: Take 1.5 tablets (30 mg total) by mouth daily.    Dispense:  45 tablet    Refill:  3    Follow up plan: Return in about 3 months (around 08/16/2015) for visit re: diabetes, cholesterol, BP; also see AMY in 2 weeks for BP and BMP.   An After Visit Summary was printed  and given to the patient.

## 2015-05-16 NOTE — Assessment & Plan Note (Signed)
Continue current meds; I have tried to get referrals started in the past for him, and I'm not sure why he isn't established with a psychiatrist, but will enter this again; patient agrees to go

## 2015-05-16 NOTE — Assessment & Plan Note (Signed)
Patient was encouraged to avoid NSAIDs; we'll work on getting his BP under better control; return in 2 weeks for recheck BP but also recheck BMP to ensure renal function does not drop with increase in meds

## 2015-05-16 NOTE — Assessment & Plan Note (Signed)
Foot exam by MD today, patient to check feet every night Offered referral to podiatry for hte callouses and thickened nails; he declined but is welcome to call if he changes his mind Last A1C reviewed, excellent, 5.6 in May; next due in 6 months

## 2015-05-16 NOTE — Patient Instructions (Addendum)
Increase the amlodipine from 5 mg to 10 mg daily Increase the lisinopril from 20 mg to 30 mg daily Return to see [facility] in 2 weeks for labs (BMP) and BP recheck I'm referring you to the headache specialist as well as the gastroenterologist for the abdominal pain Limit salt in your diet Do really think about quitting smoking Please have fasting labs done on or just after September 1st and return to see me the week after Moisturize your feet every day, and let me know if you decide you'd like to see a podiatrist Try limit or avoid bacon, sausage, and other fats

## 2015-05-16 NOTE — Assessment & Plan Note (Signed)
Reviewed last lipid panel; he has just recently started on low dose statin; monitor lipids and sgpt at f/u in 3 months; try to avoid bacon, sausage, other foods containing lots of saturated fats

## 2015-05-30 ENCOUNTER — Ambulatory Visit (INDEPENDENT_AMBULATORY_CARE_PROVIDER_SITE_OTHER): Payer: Medicare Other | Admitting: Family Medicine

## 2015-05-30 VITALS — BP 125/83 | HR 91

## 2015-05-30 DIAGNOSIS — I10 Essential (primary) hypertension: Secondary | ICD-10-CM

## 2015-05-30 DIAGNOSIS — E119 Type 2 diabetes mellitus without complications: Secondary | ICD-10-CM | POA: Diagnosis not present

## 2015-05-30 DIAGNOSIS — F17210 Nicotine dependence, cigarettes, uncomplicated: Secondary | ICD-10-CM | POA: Diagnosis not present

## 2015-05-30 DIAGNOSIS — Z7982 Long term (current) use of aspirin: Secondary | ICD-10-CM | POA: Diagnosis not present

## 2015-05-30 DIAGNOSIS — M5414 Radiculopathy, thoracic region: Secondary | ICD-10-CM | POA: Diagnosis not present

## 2015-05-30 DIAGNOSIS — Z79899 Other long term (current) drug therapy: Secondary | ICD-10-CM | POA: Diagnosis not present

## 2015-05-30 DIAGNOSIS — G894 Chronic pain syndrome: Secondary | ICD-10-CM | POA: Diagnosis not present

## 2015-05-30 DIAGNOSIS — R109 Unspecified abdominal pain: Secondary | ICD-10-CM | POA: Diagnosis not present

## 2015-05-30 DIAGNOSIS — Z79891 Long term (current) use of opiate analgesic: Secondary | ICD-10-CM | POA: Diagnosis not present

## 2015-05-31 LAB — BASIC METABOLIC PANEL
BUN/Creatinine Ratio: 13 (ref 9–20)
BUN: 18 mg/dL (ref 6–24)
CALCIUM: 9.3 mg/dL (ref 8.7–10.2)
CO2: 23 mmol/L (ref 18–29)
CREATININE: 1.4 mg/dL — AB (ref 0.76–1.27)
Chloride: 102 mmol/L (ref 97–108)
GFR calc Af Amer: 68 mL/min/{1.73_m2} (ref >59)
GFR, EST NON AFRICAN AMERICAN: 59 mL/min/{1.73_m2} — AB (ref >59)
Glucose: 92 mg/dL (ref 65–99)
Potassium: 4.3 mmol/L (ref 3.5–5.2)
Sodium: 141 mmol/L (ref 134–144)

## 2015-05-31 NOTE — Progress Notes (Signed)
Dr. Sanda Klein, I need you to sign off on the encounter for his BP check.   ------------------ Patient came in for BP recheck Last visit reveiwed His ACE-I dose was increased BP today is much improved BMP being drawn to ensure no major rise in creatinine Keep next appt with me in about 2-1/2 months

## 2015-06-01 MED ORDER — LISINOPRIL 20 MG PO TABS: 30.0000 mg | ORAL_TABLET | Freq: Every day | ORAL | Status: DC

## 2015-06-10 ENCOUNTER — Other Ambulatory Visit: Payer: Self-pay | Admitting: Family Medicine

## 2015-06-10 NOTE — Telephone Encounter (Signed)
Routing to provider  

## 2015-06-14 ENCOUNTER — Other Ambulatory Visit: Payer: Self-pay | Admitting: Family Medicine

## 2015-06-29 DIAGNOSIS — G8929 Other chronic pain: Secondary | ICD-10-CM | POA: Diagnosis not present

## 2015-06-29 DIAGNOSIS — Z79891 Long term (current) use of opiate analgesic: Secondary | ICD-10-CM | POA: Diagnosis not present

## 2015-06-29 DIAGNOSIS — R109 Unspecified abdominal pain: Secondary | ICD-10-CM | POA: Diagnosis not present

## 2015-06-29 DIAGNOSIS — G894 Chronic pain syndrome: Secondary | ICD-10-CM | POA: Diagnosis not present

## 2015-06-29 DIAGNOSIS — M5414 Radiculopathy, thoracic region: Secondary | ICD-10-CM | POA: Diagnosis not present

## 2015-06-30 ENCOUNTER — Ambulatory Visit: Payer: Medicare Other | Admitting: Psychiatry

## 2015-07-10 ENCOUNTER — Other Ambulatory Visit: Payer: Self-pay

## 2015-07-10 ENCOUNTER — Emergency Department
Admission: EM | Admit: 2015-07-10 | Discharge: 2015-07-10 | Discharge status: Against Medical Advice | Payer: Medicare Other | Attending: Emergency Medicine | Admitting: Emergency Medicine

## 2015-07-10 ENCOUNTER — Encounter: Payer: Self-pay | Admitting: Emergency Medicine

## 2015-07-10 DIAGNOSIS — E119 Type 2 diabetes mellitus without complications: Secondary | ICD-10-CM | POA: Diagnosis not present

## 2015-07-10 DIAGNOSIS — Z720 Tobacco use: Secondary | ICD-10-CM | POA: Diagnosis not present

## 2015-07-10 DIAGNOSIS — I10 Essential (primary) hypertension: Secondary | ICD-10-CM | POA: Insufficient documentation

## 2015-07-10 DIAGNOSIS — R109 Unspecified abdominal pain: Secondary | ICD-10-CM | POA: Diagnosis not present

## 2015-07-10 LAB — CBC WITH DIFFERENTIAL/PLATELET
Basophils Absolute: 0.1 10*3/uL (ref 0–0.1)
Basophils Relative: 1 %
Eosinophils Absolute: 0.4 10*3/uL (ref 0–0.7)
Eosinophils Relative: 4 %
HCT: 43.2 % (ref 40.0–52.0)
Hemoglobin: 14.8 g/dL (ref 13.0–18.0)
Lymphocytes Relative: 37 %
Lymphs Abs: 3.5 10*3/uL (ref 1.0–3.6)
MCH: 29.7 pg (ref 26.0–34.0)
MCHC: 34.3 g/dL (ref 32.0–36.0)
MCV: 86.6 fL (ref 80.0–100.0)
Monocytes Absolute: 0.6 10*3/uL (ref 0.2–1.0)
Monocytes Relative: 6 %
NEUTROS PCT: 52 %
Neutro Abs: 5.1 10*3/uL (ref 1.4–6.5)
Platelets: 295 10*3/uL (ref 150–440)
RBC: 4.99 MIL/uL (ref 4.40–5.90)
RDW: 14.4 % (ref 11.5–14.5)
WBC: 9.7 10*3/uL (ref 3.8–10.6)

## 2015-07-10 LAB — COMPREHENSIVE METABOLIC PANEL
ALT: 18 U/L (ref 17–63)
AST: 22 U/L (ref 15–41)
Albumin: 4.4 g/dL (ref 3.5–5.0)
Alkaline Phosphatase: 97 U/L (ref 38–126)
Anion gap: 7 (ref 5–15)
BUN: 14 mg/dL (ref 6–20)
CHLORIDE: 106 mmol/L (ref 101–111)
CO2: 27 mmol/L (ref 22–32)
Calcium: 9.5 mg/dL (ref 8.9–10.3)
Creatinine, Ser: 1.25 mg/dL — ABNORMAL HIGH (ref 0.61–1.24)
GFR calc Af Amer: >60 mL/min (ref >60)
Glucose, Bld: 100 mg/dL — ABNORMAL HIGH (ref 65–99)
Potassium: 4 mmol/L (ref 3.5–5.1)
Sodium: 140 mmol/L (ref 135–145)
TOTAL PROTEIN: 7.5 g/dL (ref 6.5–8.1)
Total Bilirubin: 0.5 mg/dL (ref 0.3–1.2)

## 2015-07-10 LAB — LIPASE, BLOOD: LIPASE: 36 U/L (ref 22–51)

## 2015-07-10 NOTE — ED Notes (Signed)
Pt presents to the ER from home with complaints of severe abdominal pain for about 2 days. Pt reports she has a history of pancreatitis, perforated bowel. Pt reports [facility] not sure what the appt is for

## 2015-07-11 ENCOUNTER — Other Ambulatory Visit: Payer: Self-pay

## 2015-07-12 ENCOUNTER — Encounter: Payer: Self-pay | Admitting: Gastroenterology

## 2015-07-12 ENCOUNTER — Ambulatory Visit (INDEPENDENT_AMBULATORY_CARE_PROVIDER_SITE_OTHER): Payer: Medicare Other | Admitting: Gastroenterology

## 2015-07-12 VITALS — BP 139/77 | HR 88 | Ht 66.0 in | Wt 221.6 lb

## 2015-07-12 DIAGNOSIS — G8929 Other chronic pain: Secondary | ICD-10-CM | POA: Diagnosis not present

## 2015-07-12 DIAGNOSIS — R1013 Epigastric pain: Secondary | ICD-10-CM | POA: Diagnosis not present
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
## 2015-07-20 ENCOUNTER — Encounter: Payer: Medicare Other | Admitting: Family Medicine

## 2015-07-24 ENCOUNTER — Encounter: Payer: Self-pay | Admitting: Emergency Medicine

## 2015-07-24 ENCOUNTER — Emergency Department: Payer: Medicare Other

## 2015-07-24 ENCOUNTER — Other Ambulatory Visit: Payer: Self-pay

## 2015-07-24 ENCOUNTER — Emergency Department
Admission: EM | Admit: 2015-07-24 | Discharge: 2015-07-24 | Disposition: A | Discharge status: Home / Self Care | Payer: Medicare Other | Attending: Emergency Medicine | Admitting: Emergency Medicine

## 2015-07-24 DIAGNOSIS — E119 Type 2 diabetes mellitus without complications: Secondary | ICD-10-CM | POA: Insufficient documentation

## 2015-07-24 DIAGNOSIS — R197 Diarrhea, unspecified: Secondary | ICD-10-CM | POA: Diagnosis not present

## 2015-07-24 DIAGNOSIS — Z792 Long term (current) use of antibiotics: Secondary | ICD-10-CM | POA: Diagnosis not present

## 2015-07-24 DIAGNOSIS — N182 Chronic kidney disease, stage 2 (mild): Secondary | ICD-10-CM | POA: Insufficient documentation

## 2015-07-24 DIAGNOSIS — Z720 Tobacco use: Secondary | ICD-10-CM | POA: Diagnosis not present

## 2015-07-24 DIAGNOSIS — R109 Unspecified abdominal pain: Secondary | ICD-10-CM | POA: Diagnosis present

## 2015-07-24 DIAGNOSIS — Z79899 Other long term (current) drug therapy: Secondary | ICD-10-CM | POA: Diagnosis not present

## 2015-07-24 DIAGNOSIS — R1011 Right upper quadrant pain: Secondary | ICD-10-CM

## 2015-07-24 DIAGNOSIS — Z7951 Long term (current) use of inhaled steroids: Secondary | ICD-10-CM | POA: Diagnosis not present

## 2015-07-24 DIAGNOSIS — I129 Hypertensive chronic kidney disease with stage 1 through stage 4 chronic kidney disease, or unspecified chronic kidney disease: Secondary | ICD-10-CM | POA: Diagnosis not present

## 2015-07-24 DIAGNOSIS — K2980 Duodenitis without bleeding: Secondary | ICD-10-CM | POA: Diagnosis not present

## 2015-07-24 DIAGNOSIS — Z7982 Long term (current) use of aspirin: Secondary | ICD-10-CM | POA: Diagnosis not present

## 2015-07-24 LAB — URINALYSIS COMPLETE WITH MICROSCOPIC (ARMC ONLY)
BACTERIA UA: NONE SEEN
Bilirubin Urine: NEGATIVE
GLUCOSE, UA: NEGATIVE mg/dL
Hgb urine dipstick: NEGATIVE
Ketones, ur: NEGATIVE mg/dL
Leukocytes, UA: NEGATIVE
Nitrite: NEGATIVE
Protein, ur: NEGATIVE mg/dL
RBC / HPF: NONE SEEN RBC/hpf (ref 0–5)
SPECIFIC GRAVITY, URINE: 1.012 (ref 1.005–1.030)
WBC, UA: NONE SEEN WBC/hpf (ref 0–5)
pH: 6 (ref 5.0–8.0)

## 2015-07-24 LAB — CBC
HEMATOCRIT: 41.1 % (ref 40.0–52.0)
Hemoglobin: 13.9 g/dL (ref 13.0–18.0)
MCH: 29.5 pg (ref 26.0–34.0)
MCHC: 33.9 g/dL (ref 32.0–36.0)
MCV: 87 fL (ref 80.0–100.0)
Platelets: 291 10*3/uL (ref 150–440)
RBC: 4.73 MIL/uL (ref 4.40–5.90)
RDW: 13.9 % (ref 11.5–14.5)
WBC: 10.2 10*3/uL (ref 3.8–10.6)

## 2015-07-24 LAB — COMPREHENSIVE METABOLIC PANEL
ALT: 16 U/L — ABNORMAL LOW (ref 17–63)
AST: 24 U/L (ref 15–41)
Albumin: 4 g/dL (ref 3.5–5.0)
Alkaline Phosphatase: 87 U/L (ref 38–126)
Anion gap: 8 (ref 5–15)
BILIRUBIN TOTAL: 0.4 mg/dL (ref 0.3–1.2)
BUN: 14 mg/dL (ref 6–20)
CALCIUM: 8.9 mg/dL (ref 8.9–10.3)
CHLORIDE: 108 mmol/L (ref 101–111)
CO2: 24 mmol/L (ref 22–32)
CREATININE: 1.52 mg/dL — AB (ref 0.61–1.24)
GFR calc Af Amer: >60 mL/min (ref >60)
GFR calc non Af Amer: 52 mL/min — ABNORMAL LOW (ref >60)
GLUCOSE: 132 mg/dL — AB (ref 65–99)
Potassium: 3.9 mmol/L (ref 3.5–5.1)
SODIUM: 140 mmol/L (ref 135–145)
TOTAL PROTEIN: 7 g/dL (ref 6.5–8.1)

## 2015-07-24 LAB — LIPASE, BLOOD: Lipase: 20 U/L — ABNORMAL LOW (ref 22–51)

## 2015-07-24 IMAGING — CT CT ABD-PELV W/ CM
1 of 3 series · 14 of 32 positions shown, 19 images · IV contrast (omnipaque)
Comparison: [DATE]

CLINICAL DATA: Right upper quadrant pain.  Diarrhea.

EXAM:
CT ABDOMEN AND PELVIS WITH CONTRAST
TECHNIQUE: Multidetector CT imaging of the abdomen and pelvis was performed
using the standard protocol following bolus administration of
intravenous contrast.
CONTRAST:  100mL OMNIPAQUE IOHEXOL 300 MG/ML  SOLN

[Series 2: routine abd pel with · axial · 0.84mm/px · z∈[-547,-102]mm · 14 of 101 slices shown, 19 images]
[im 6/101  soft-tissue]
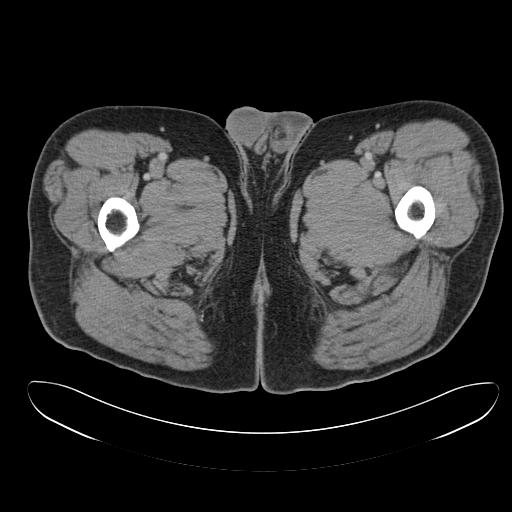
[im 6/101  bone]
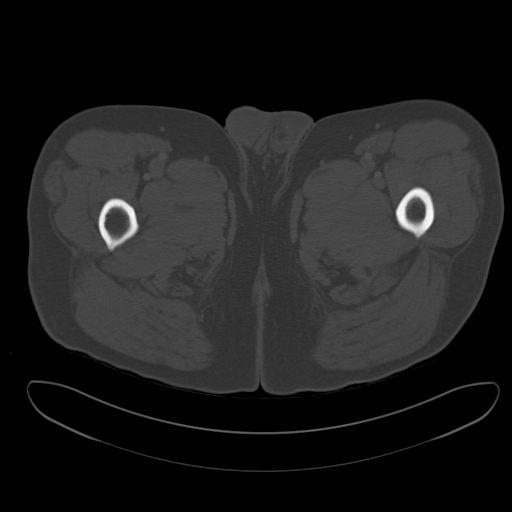
[im 16/101  soft-tissue]
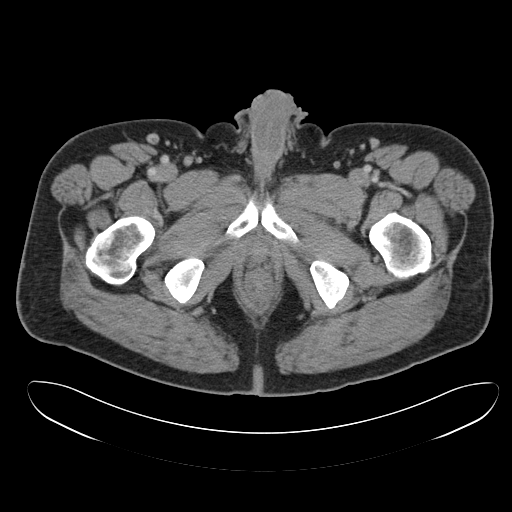
[im 22/101  soft-tissue]
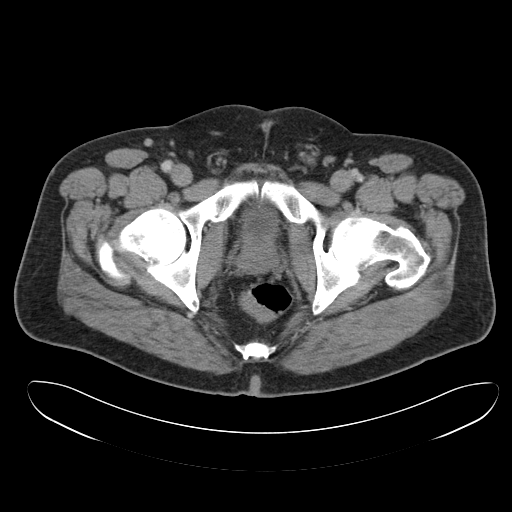
[im 27/101  soft-tissue]
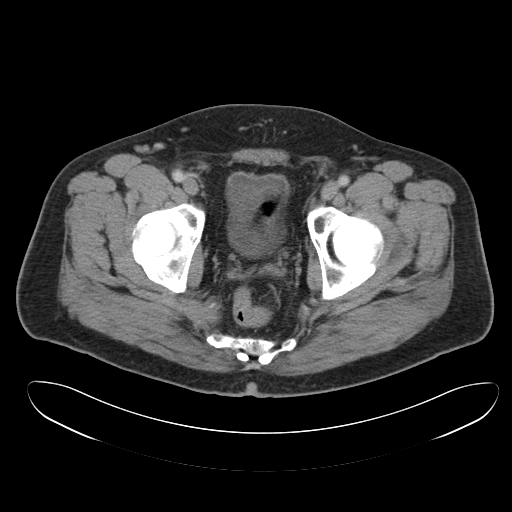
[im 37/101  soft-tissue]
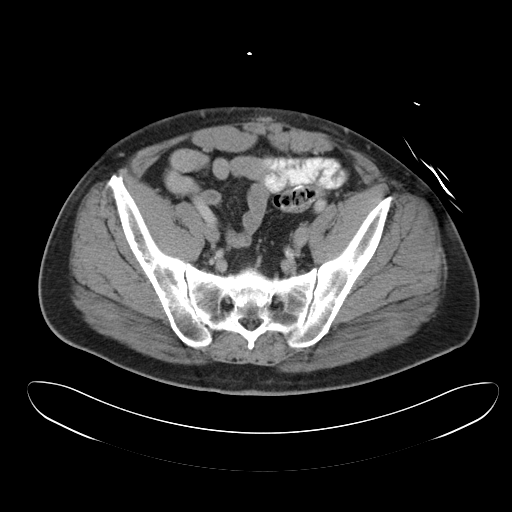
[im 43/101  soft-tissue]
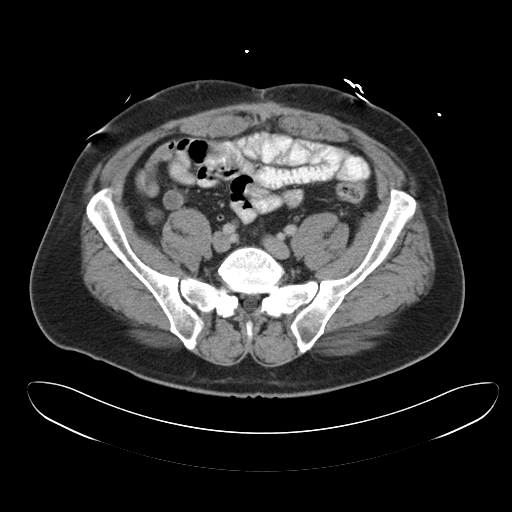
[im 53/101  soft-tissue]
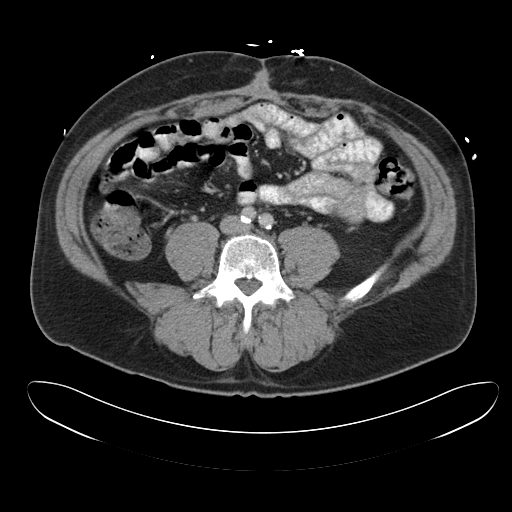
[im 58/101  soft-tissue]
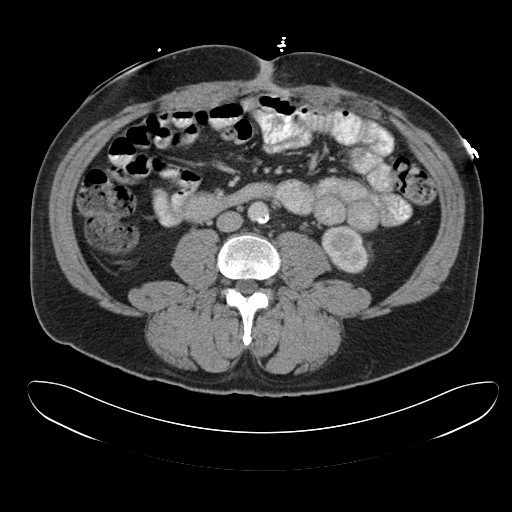
[im 64/101  soft-tissue]
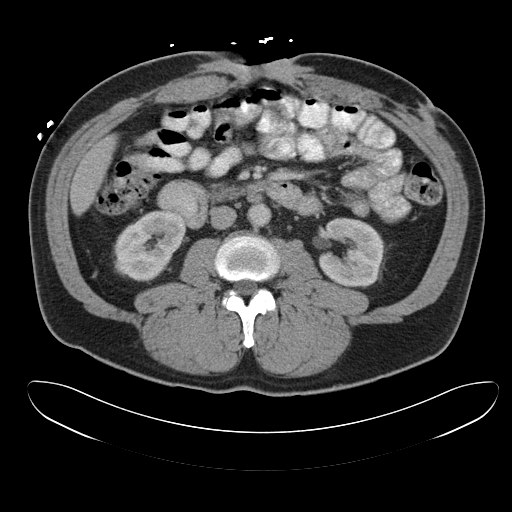
[im 64/101  bone]
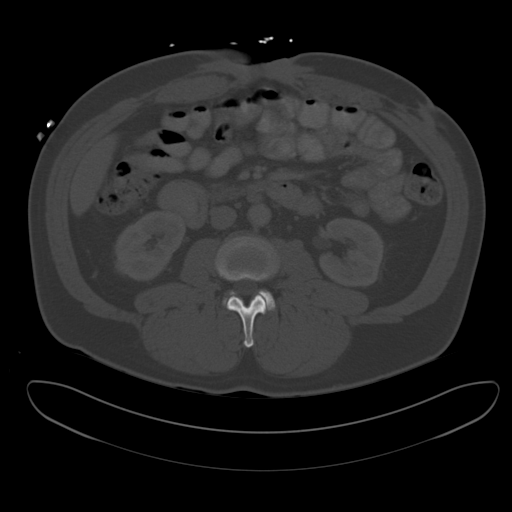
[im 74/101  soft-tissue]
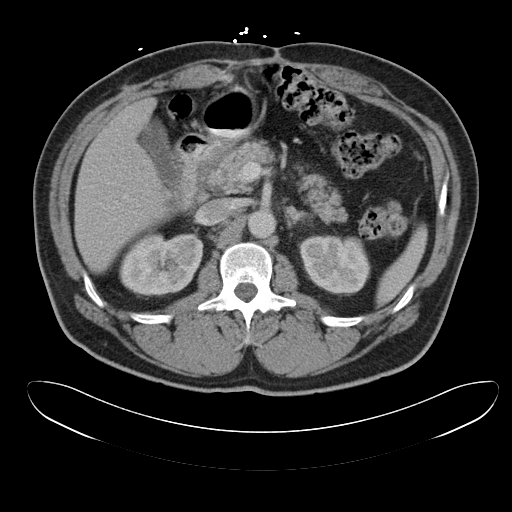
[im 79/101  soft-tissue]
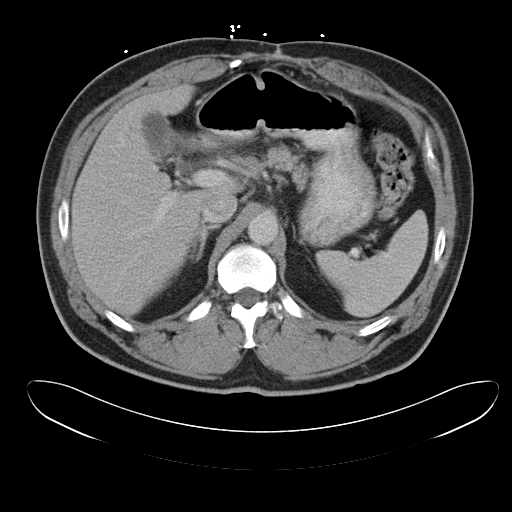
[im 79/101  lung]
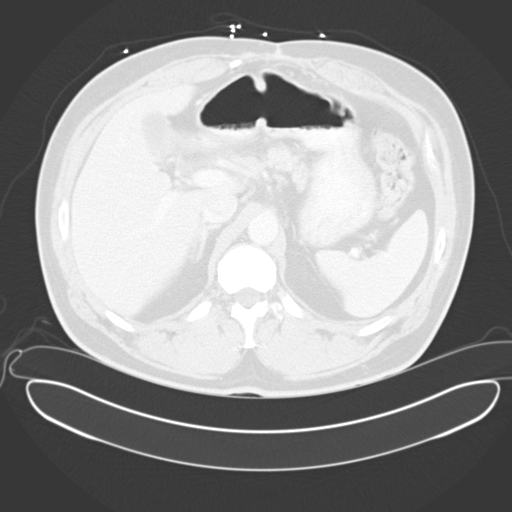
[im 85/101  soft-tissue]
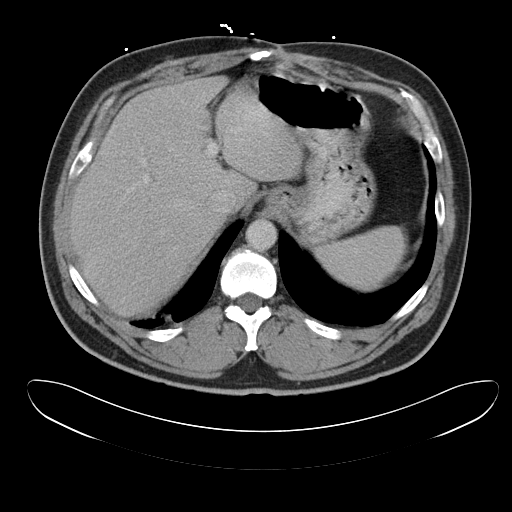
[im 85/101  lung]
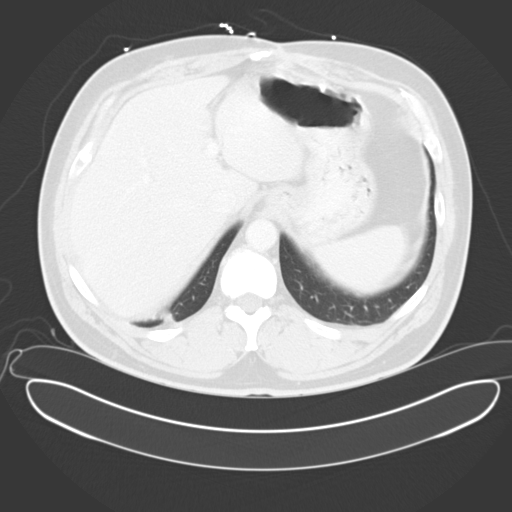
[im 90/101  lung]
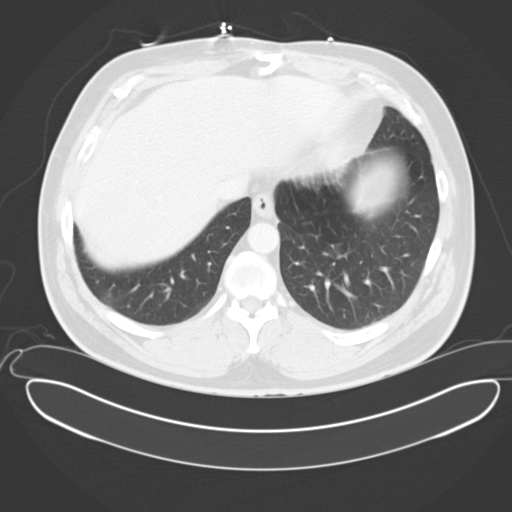
[im 95/101  soft-tissue]
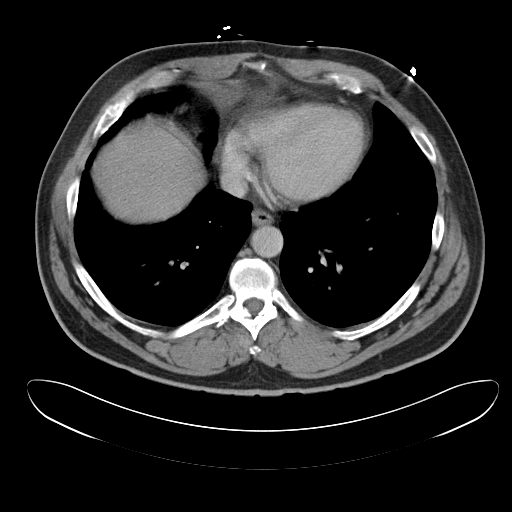
[im 95/101  lung]
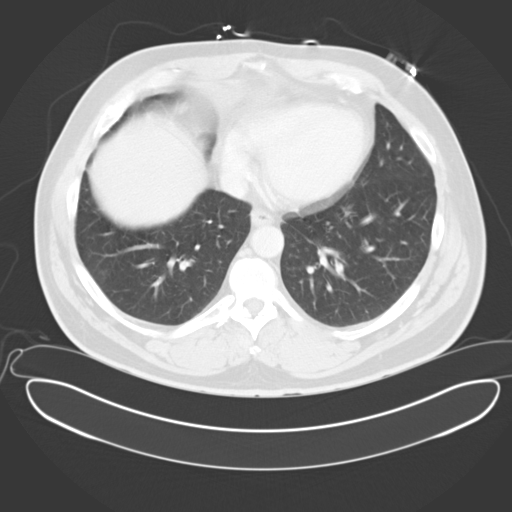

[14 of 32 positions shown; findings below may reference images not displayed]

FINDINGS: BODY WALL: Extensive scarring of the abdominal wall with dystrophic
calcifications in the epigastrium contiguous with the xiphoid.

LOWER CHEST: No contributory findings.

ABDOMEN/PELVIS:

Liver: No focal abnormality.

Biliary: No evidence of biliary obstruction or stone.

Pancreas: Mildly indistinct fat in the groove between the head and
duodenum. Noted absence of lipase increase.

Spleen: Unremarkable.

Adrenals: Unremarkable.

Kidneys and ureters: No hydronephrosis or stone.

Bladder: Unremarkable.

Reproductive: No pathologic findings.

Bowel: Mildly indistinct fat around the proximal duodenum and
gastric antrum with no definitive wall thickening or ulceration.
Widely patent sigmoidectomy anastomosis. No appendicitis.

Retroperitoneum: Mild prominence of lymph nodes around the above
described inflammatory change.

Peritoneum: No ascites or pneumoperitoneum.

Vascular: No acute abnormality.

OSSEOUS: No acute abnormalities.
IMPRESSION: Suspect mild antritis/proximal duodenitis.

## 2015-07-24 MED ORDER — FENTANYL CITRATE (PF) 100 MCG/2ML IJ SOLN
75.0000 ug | Freq: Once | INTRAMUSCULAR | Status: AC
  Administered 2015-07-24: 75 ug via INTRAVENOUS
  Filled 2015-07-24: qty 2

## 2015-07-24 MED ORDER — IOHEXOL 300 MG/ML  SOLN
100.0000 mL | Freq: Once | INTRAMUSCULAR | Status: AC | PRN
  Administered 2015-07-24: 100 mL via INTRAVENOUS

## 2015-07-24 MED ORDER — SUCRALFATE 1 G PO TABS: 1.0000 g | ORAL_TABLET | Freq: Four times a day (QID) | ORAL | Status: DC

## 2015-07-24 MED ORDER — ONDANSETRON HCL 4 MG/2ML IJ SOLN
4.0000 mg | Freq: Once | INTRAMUSCULAR | Status: AC
  Administered 2015-07-24: 4 mg via INTRAVENOUS

## 2015-07-24 MED ORDER — IOHEXOL 240 MG/ML SOLN
25.0000 mL | Freq: Once | INTRAMUSCULAR | Status: AC | PRN
  Administered 2015-07-24: 25 mL via ORAL

## 2015-07-24 MED ORDER — ONDANSETRON HCL 4 MG/2ML IJ SOLN
INTRAMUSCULAR | Status: AC
  Administered 2015-07-24: 4 mg via INTRAVENOUS
  Filled 2015-07-24: qty 2

## 2015-07-24 MED ORDER — MORPHINE SULFATE 4 MG/ML IJ SOLN
INTRAMUSCULAR | Status: AC
  Filled 2015-07-24: qty 1

## 2015-07-24 MED ORDER — RANITIDINE HCL 150 MG PO TABS: 150.0000 mg | ORAL_TABLET | Freq: Two times a day (BID) | ORAL | Status: DC
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
## 2015-07-24 NOTE — ED Provider Notes (Signed)
[facility] Emergency Department Provider Note   ____________________________________________  Time seen: 2015  I have reviewed the triage vital signs and the nursing notes.   HISTORY  Chief Complaint Abdominal Pain   History limited by: Not Limited   HPI Derek Cooley. is a 49 y.o. male who presents to the emergency department today because of concerns for abdominal pain. Patient has been having problems with abdominal pain for a long time. Recently in the past 2 weeks he states it is been worse. He did see his GI doctor who thought it was likely muscle scalpel nature. He states that his continued to persist. He states that the pain is worse in the upper right quadrant. He has had some nausea. He denies any bloody vomiting or bloody stools. Denies any fevers.   Past Medical History  Diagnosis Date  . MRSA (methicillin resistant Staphylococcus aureus)   . Emphysema of lung     right sided  . Asthma   . Sleep apnea     sleep study 2013  . Hypertension   . Diabetes mellitus without complication   . GERD (gastroesophageal reflux disease)   . Anxiety   . Depression   . Panic disorder   . Acute pancreatitis   . Abdominal abscess   . Perforated bowel 2007    Tempoary Colostomy Bag, Skin Graft for Abd wound  . Back pain   . Obesity   . Subarachnoid hemorrhage 2011    coil placed  . Mild cognitive impairment     Patient Active Problem List   Diagnosis Date Noted  . Type 2 diabetes mellitus without complication 123456  . High blood cholesterol 05/16/2015  . Depression 05/16/2015  . Chronic anxiety 05/16/2015  . Benign hypertension 05/16/2015  . Persistent headaches 05/16/2015  . Abdominal pain, chronic, epigastric 05/16/2015  . CKD (chronic kidney disease) stage 2, GFR 60-89 ml/min 05/16/2015  . Tobacco abuse 05/16/2015    Past Surgical History  Procedure Laterality Date  . Colon surgery  2007    colostomy bag placed s/p perforated  bowel  . Perforated bowel    . Knee surgery Right     Current Outpatient Rx  Name  Route  Sig  Dispense  Refill  . albuterol (PROVENTIL HFA;VENTOLIN HFA) 108 (90 BASE) MCG/ACT inhaler   Inhalation   Inhale into the lungs every 6 (six) hours as needed for wheezing or shortness of breath.         Marland Kitchen amLODipine (NORVASC) 10 MG tablet   Oral   Take 1 tablet (10 mg total) by mouth daily.   90 tablet   3   . aspirin EC 81 MG tablet   Oral   Take 81 mg by mouth daily.         Marland Kitchen atorvastatin (LIPITOR) 10 MG tablet            0   . budesonide-formoterol (SYMBICORT) 80-4.5 MCG/ACT inhaler   Inhalation   Inhale 2 puffs into the lungs 2 (two) times daily.         . Buprenorphine 15 MCG/HR PTWK   Transdermal   Place onto the skin. One patch weekly         . clonazePAM (KLONOPIN) 0.5 MG tablet   Oral   Take 0.5 mg by mouth 2 (two) times daily as needed for anxiety.         . docusate sodium (COLACE) 100 MG capsule   Oral  Take 100 mg by mouth 2 (two) times daily.         Marland Kitchen doxycycline (VIBRAMYCIN) 100 MG capsule   Oral   Take 100 mg by mouth 2 (two) times daily.         . fluticasone (FLONASE) 50 MCG/ACT nasal spray   Each Nare   Place into both nostrils daily.         Marland Kitchen glucose blood test strip   Other   1 each by Other route as needed for other. Use as instructed         . hydrOXYzine (ATARAX/VISTARIL) 25 MG tablet   Oral   Take 25 mg by mouth every 4 (four) hours as needed.         . hydrOXYzine (VISTARIL) 50 MG capsule   Oral   Take 50 mg by mouth every 6 (six) hours as needed.         Marland Kitchen lisinopril (PRINIVIL,ZESTRIL) 20 MG tablet   Oral   Take 1.5 tablets (30 mg total) by mouth daily.   45 tablet   3   . mupirocin cream (BACTROBAN) 2 %   Topical   Apply 1 application topically 2 (two) times daily.         Marland Kitchen omeprazole (PRILOSEC) 40 MG capsule   Oral   Take 40 mg by mouth daily.         . polyethylene glycol (MIRALAX /  GLYCOLAX) packet   Oral   Take 17 g by mouth daily.         . sertraline (ZOLOFT) 100 MG tablet   Oral   Take 1 tablet (100 mg total) by mouth daily.   30 tablet   3   . traMADol (ULTRAM) 50 MG tablet   Oral   Take by mouth 4 (four) times daily. Take 2 tablets 4 times a day as needed         . traZODone (DESYREL) 50 MG tablet   Oral   Take 50 mg by mouth at bedtime.           Allergies Avelox; Dilaudid; Doxycycline; Fluoxetine; Levofloxacin; and Vancomycin  Family History  Problem Relation Age of Onset  . Arthritis Mother   . Asthma Mother   . Diabetes Mother   . Heart disease Mother   . Hyperlipidemia Mother   . Hypertension Mother   . Kidney disease Mother   . Thyroid disease Mother   . Lung disease Mother   . Diabetes Father   . Heart disease Father   . Arthritis Sister   . Asthma Sister   . Hyperlipidemia Sister   . Hypertension Sister   . Lung disease Sister   . Hyperlipidemia Brother   . Hypertension Brother     Social History Social History  Substance Use Topics  . Smoking status: Current Every Day Smoker -- 1.00 packs/day  . Smokeless tobacco: Never Used  . Alcohol Use: No    Review of Systems  Constitutional: Negative for fever. Cardiovascular: Negative for chest pain. Respiratory: Negative for shortness of breath. Gastrointestinal: Positive for abdominal pain Genitourinary: Negative for dysuria. Musculoskeletal: Negative for back pain. Skin: Negative for rash. Neurological: Negative for headaches, focal weakness or numbness.  10-point ROS otherwise negative.  ____________________________________________   PHYSICAL EXAM:  VITAL SIGNS: ED Triage Vitals  Enc Vitals Group     BP 07/24/15 1514 144/82 mmHg     Pulse Rate 07/24/15 1514 80     Resp 07/24/15  1514 20     Temp 07/24/15 1514 98.2 F (36.8 C)     Temp Source 07/24/15 1514 Oral     SpO2 07/24/15 1514 97 %     Weight 07/24/15 1514 215 lb (97.523 kg)     Height  07/24/15 1514 5\' 6"  (1.676 m)   Constitutional: Alert and oriented. In mild distress. Eyes: Conjunctivae are normal. PERRL. Normal extraocular movements. ENT   Head: Normocephalic and atraumatic.   Nose: No congestion/rhinnorhea.   Mouth/Throat: Mucous membranes are moist.   Neck: No stridor. Hematological/Lymphatic/Immunilogical: No cervical lymphadenopathy. Cardiovascular: Normal rate, regular rhythm.  No murmurs, rubs, or gallops. Respiratory: Normal respiratory effort without tachypnea nor retractions. Breath sounds are clear and equal bilaterally. No wheezes/rales/rhonchi. Gastrointestinal: Multiple old surgical scars present. Soft. Tender to palpation in the right upper quadrant.  Genitourinary: Deferred Musculoskeletal: Normal range of motion in all extremities. No joint effusions.  No lower extremity tenderness nor edema. Neurologic:  Normal speech and language. No gross focal neurologic deficits are appreciated. Speech is normal.  Skin:  Skin is warm, dry and intact. No rash noted. Psychiatric: Mood and affect are normal. Speech and behavior are normal. Patient exhibits appropriate insight and judgment.  ____________________________________________    LABS (pertinent positives/negatives)  Labs Reviewed  LIPASE, BLOOD - Abnormal; Notable for the following:    Lipase 20 (*)    All other components within normal limits  COMPREHENSIVE METABOLIC PANEL - Abnormal; Notable for the following:    Glucose, Bld 132 (*)    Creatinine, Ser 1.52 (*)    ALT 16 (*)    GFR calc non Af Amer 52 (*)    All other components within normal limits  URINALYSIS COMPLETEWITH MICROSCOPIC ([facility] ONLY) - Abnormal; Notable for the following:    Color, Urine STRAW (*)    APPearance CLEAR (*)    Squamous Epithelial / LPF 0-5 (*)    All other components within normal limits  CBC     ____________________________________________   EKG  I, Nance Pear, attending physician,  personally viewed and interpreted this EKG  EKG Time: 1549 Rate: 72 Rhythm: NSR Axis: NSR Intervals: qtc 433 QRS: narrow ST changes: no st elevation  ____________________________________________    RADIOLOGY  CT abd/pel IMPRESSION: Suspect mild antritis/proximal duodenitis.  RUQ US IMPRESSION: No acute abnormality noted.  ____________________________________________   PROCEDURES  Procedure(s) performed: None  Critical Care performed: No  ____________________________________________   INITIAL IMPRESSION / ASSESSMENT AND PLAN / ED COURSE  Pertinent labs & imaging results that were available during my care of the patient were reviewed by me and considered in my medical decision making (see chart for details).  Patient presented to the emergency department today with concerns for abdominal pain.  Culture ultrasound did not show any gallbladder seeds last CT abdomen and pelvis was performed. This did show some possible duodenitis. I did discuss this finding with the patient. Will plan on discharging home with an antacid and sucralfate. Advised patient not to use over-the-counter pain medications or alcohol. Will  Have patient follow-up with primary care doctor.  ____________________________________________   FINAL CLINICAL IMPRESSION(S) / ED DIAGNOSES  Final diagnoses:  RUQ pain  Duodenitis     Nance Pear, MD 07/24/15 [phone]

## 2015-07-24 NOTE — Discharge Instructions (Signed)
Please seek medical attention for any high fevers, chest pain, shortness of breath, change in behavior, persistent vomiting, bloody stool or any other new or concerning symptoms.   Duodenitis Duodenitis is inflammation of the lining of the first part of your small intestine (duodenum). There are two types of duodenitis:  Acute duodenitis (develops suddenly and is short lived).   Chronic duodenitis (develops over an extended period and lasts months to years). CAUSES  Duodenitis is most often caused by infection with the bacterium Helicobacter pylori (H. pylori). H. pylori increases the production of stomach acid and causes changes in the environment of the duodenum. This irritates and damages the cells of the duodenum causing inflammation. Other causes of duodenitis include:   Long-term use of nonsteroidal anti-inflammatory drugs (NSAIDs). NSAIDs change the lining of the duodenum and make it more prone to injury from stomach acid.  Excessive use of alcohol. Alcohol increases stomach acid and changes the lining of the duodenum which makes it more likely for inflammation to develop.  Giardiasis. Giardiasis is a common infection of the small intestine. It can cause inflammation of the duodenum.   Other gastrointestinal disorders, such as Crohn disease. People with these disorders are more likely to develop duodenitis. SYMPTOMS  Although duodenitis does not always cause symptoms, symptoms that do occur include:  Nausea or vomiting.  Gassy, bloated feeling or an uncomfortable feeling of fullness after eating.  Burning, cramps, or pain in the upper abdominal area. DIAGNOSIS  To diagnose duodenitis, your health care provider may use results from:   An exam of the duodenum using a thin tube with a tiny camera on the tip, which is placed down your throat (endoscope). The endoscope is passed through your stomach and into your duodenum. Sometimes a sample of tissue from your duodenum is removed  with the endoscope. The sample is then examined under a microscope (biopsy) for signs of inflammation and H. pylori infection.   Tests that check samples of your blood or stool for H. pylori infection.   A test that checks the gases in a sample of your expired breath for H. pylori infection. The test measures the levels of carbon dioxide in your breath after you drink a special solution.  An X-ray exam using a special liquid that you swallow to illuminate your digestive tract (barium) to show signs of inflammation. TREATMENT  Treatment will depend on the cause of the duodenitis. The most common treatments include:  Use of medication to treat infection.  Medication to reduce stomach acid.  Discontinuing the use of NSAIDs.  Management of other gastrointestinal conditions.  Avoiding alcohol consumption. Additionally, taking the following steps can help to reduce the severity of your symptoms:  Drink enough water to keep your urine clear or pale yellow.  Avoid consuming these foods or drinks:  Caffeinated drinks.  Chocolate.  Peppermint or mint-flavored food or drinks.  Garlic.  Onions.  Spicy foods.  Citrus fruits, such as oranges, lemons, or limes.  Foods that use tomato-based sauces, such as pasta sauce, chili, salsa, and pizza.  Fatty foods.  Fried foods. Document Released: 03/23/2013 Document Revised: 04/12/2014 Document Reviewed: 03/23/2013 [facility] Patient Information 2015 [place], [place]. This information is not intended to replace advice given to you by your health care provider. Make sure you discuss any questions you have with your health care provider.

## 2015-07-25 ENCOUNTER — Other Ambulatory Visit: Payer: Self-pay | Admitting: Family Medicine

## 2015-07-26 DIAGNOSIS — R109 Unspecified abdominal pain: Secondary | ICD-10-CM | POA: Diagnosis not present

## 2015-07-26 DIAGNOSIS — M1990 Unspecified osteoarthritis, unspecified site: Secondary | ICD-10-CM | POA: Diagnosis not present

## 2015-07-26 DIAGNOSIS — E119 Type 2 diabetes mellitus without complications: Secondary | ICD-10-CM | POA: Diagnosis not present

## 2015-07-26 DIAGNOSIS — J449 Chronic obstructive pulmonary disease, unspecified: Secondary | ICD-10-CM | POA: Diagnosis not present

## 2015-07-26 DIAGNOSIS — Z885 Allergy status to narcotic agent status: Secondary | ICD-10-CM | POA: Diagnosis not present

## 2015-07-26 DIAGNOSIS — F17210 Nicotine dependence, cigarettes, uncomplicated: Secondary | ICD-10-CM | POA: Diagnosis not present

## 2015-07-26 DIAGNOSIS — Z7982 Long term (current) use of aspirin: Secondary | ICD-10-CM | POA: Diagnosis not present

## 2015-07-26 DIAGNOSIS — I10 Essential (primary) hypertension: Secondary | ICD-10-CM | POA: Diagnosis not present

## 2015-07-26 DIAGNOSIS — K219 Gastro-esophageal reflux disease without esophagitis: Secondary | ICD-10-CM | POA: Diagnosis not present

## 2015-07-26 DIAGNOSIS — Z79891 Long term (current) use of opiate analgesic: Secondary | ICD-10-CM | POA: Diagnosis not present

## 2015-07-26 DIAGNOSIS — G8929 Other chronic pain: Secondary | ICD-10-CM | POA: Diagnosis not present

## 2015-07-28 DIAGNOSIS — G4733 Obstructive sleep apnea (adult) (pediatric): Secondary | ICD-10-CM | POA: Diagnosis not present

## 2015-07-28 DIAGNOSIS — F1410 Cocaine abuse, uncomplicated: Secondary | ICD-10-CM | POA: Diagnosis not present

## 2015-07-28 DIAGNOSIS — E669 Obesity, unspecified: Secondary | ICD-10-CM | POA: Diagnosis not present

## 2015-07-28 DIAGNOSIS — F329 Major depressive disorder, single episode, unspecified: Secondary | ICD-10-CM | POA: Diagnosis not present

## 2015-07-28 DIAGNOSIS — G8929 Other chronic pain: Secondary | ICD-10-CM | POA: Diagnosis not present

## 2015-07-28 DIAGNOSIS — G3184 Mild cognitive impairment, so stated: Secondary | ICD-10-CM | POA: Diagnosis not present

## 2015-07-28 DIAGNOSIS — R109 Unspecified abdominal pain: Secondary | ICD-10-CM | POA: Diagnosis not present

## 2015-08-04 ENCOUNTER — Other Ambulatory Visit: Payer: Self-pay

## 2015-08-04 MED ORDER — ATORVASTATIN CALCIUM 10 MG PO TABS: 10.0000 mg | ORAL_TABLET | Freq: Every day | ORAL | Status: DC

## 2015-08-04 NOTE — Telephone Encounter (Signed)
ALT just done in the hospital; lab reviewed; Rx approved, but he still needs visit and lipid panel soon

## 2015-08-04 NOTE — Telephone Encounter (Signed)
Pharm needs a 90 day supply.

## 2015-08-11 ENCOUNTER — Other Ambulatory Visit: Payer: Medicare Other

## 2015-08-17 ENCOUNTER — Ambulatory Visit: Payer: Medicare Other | Admitting: Family Medicine

## 2015-08-19 ENCOUNTER — Ambulatory Visit: Payer: Medicare Other | Admitting: Neurology

## 2015-08-25 ENCOUNTER — Encounter: Payer: Self-pay | Admitting: Family Medicine

## 2015-08-25 ENCOUNTER — Ambulatory Visit (INDEPENDENT_AMBULATORY_CARE_PROVIDER_SITE_OTHER): Payer: Medicare Other | Admitting: Family Medicine

## 2015-08-25 VITALS — BP 132/70 | HR 77 | Temp 98.1°F | Ht 66.5 in | Wt 226.0 lb

## 2015-08-25 DIAGNOSIS — Z720 Tobacco use: Secondary | ICD-10-CM

## 2015-08-25 DIAGNOSIS — R1013 Epigastric pain: Secondary | ICD-10-CM

## 2015-08-25 DIAGNOSIS — I10 Essential (primary) hypertension: Secondary | ICD-10-CM | POA: Diagnosis not present

## 2015-08-25 DIAGNOSIS — G8929 Other chronic pain: Secondary | ICD-10-CM | POA: Diagnosis not present

## 2015-08-25 DIAGNOSIS — N182 Chronic kidney disease, stage 2 (mild): Secondary | ICD-10-CM

## 2015-08-25 DIAGNOSIS — K219 Gastro-esophageal reflux disease without esophagitis: Secondary | ICD-10-CM | POA: Insufficient documentation

## 2015-08-25 NOTE — Assessment & Plan Note (Signed)
Cautions given; refer to another pain clinic

## 2015-08-25 NOTE — Patient Instructions (Addendum)
Continue the sucralfate and the other stomach medicines (omeprazole and ranitidine) Avoid non-steroidals like  Avoid foods and drinks that can trigger acid production Please do see Dr. Allen Norris Please do quit smoking Do see the psychiatrist for your anxiety and nerves  Gastroesophageal Reflux Disease, Adult Gastroesophageal reflux disease (GERD) happens when acid from your stomach flows up into the esophagus. When acid comes in contact with the esophagus, the acid causes soreness (inflammation) in the esophagus. Over time, GERD may create small holes (ulcers) in the lining of the esophagus. CAUSES   Increased body weight. This puts pressure on the stomach, making acid rise from the stomach into the esophagus.  Smoking. This increases acid production in the stomach.  Drinking alcohol. This causes decreased pressure in the lower esophageal sphincter (valve or ring of muscle between the esophagus and stomach), allowing acid from the stomach into the esophagus.  Late evening meals and a full stomach. This increases pressure and acid production in the stomach.  A malformed lower esophageal sphincter. Sometimes, no cause is found. SYMPTOMS   Burning pain in the lower part of the mid-chest behind the breastbone and in the mid-stomach area. This may occur twice a week or more often.  Trouble swallowing.  Sore throat.  Dry cough.  Asthma-like symptoms including chest tightness, shortness of breath, or wheezing. DIAGNOSIS  Your caregiver may be able to diagnose GERD based on your symptoms. In some cases, X-rays and other tests may be done to check for complications or to check the condition of your stomach and esophagus. TREATMENT  Your caregiver may recommend over-the-counter or prescription medicines to help decrease acid production. Ask your caregiver before starting or adding any new medicines.  HOME CARE INSTRUCTIONS   Change the factors that you can control. Ask your caregiver for  guidance concerning weight loss, quitting smoking, and alcohol consumption.  Avoid foods and drinks that make your symptoms worse, such as:  Caffeine or alcoholic drinks.  Chocolate.  Peppermint or mint flavorings.  Garlic and onions.  Spicy foods.  Citrus fruits, such as oranges, lemons, or limes.  Tomato-based foods such as sauce, chili, salsa, and pizza.  Fried and fatty foods.  Avoid lying down for the 3 hours prior to your bedtime or prior to taking a nap.  Eat small, frequent meals instead of large meals.  Wear loose-fitting clothing. Do not wear anything tight around your waist that causes pressure on your stomach.  Raise the head of your bed 6 to 8 inches with wood blocks to help you sleep. Extra pillows will not help.  Only take over-the-counter or prescription medicines for pain, discomfort, or fever as directed by your caregiver.  Do not take aspirin, ibuprofen, or other nonsteroidal anti-inflammatory drugs (NSAIDs). SEEK IMMEDIATE MEDICAL CARE IF:   You have pain in your arms, neck, jaw, teeth, or back.  Your pain increases or changes in intensity or duration.  You develop nausea, vomiting, or sweating (diaphoresis).  You develop shortness of breath, or you faint.  Your vomit is green, yellow, black, or looks like coffee grounds or blood.  Your stool is red, bloody, or black. These symptoms could be signs of other problems, such as heart disease, gastric bleeding, or esophageal bleeding. MAKE SURE YOU:   Understand these instructions.  Will watch your condition.  Will get help right away if you are not doing well or get worse. Document Released: 09/05/2005 Document Revised: 02/18/2012 Document Reviewed: 06/15/2011 [facility] Patient Information 2015 [place], [place]. This information  is not intended to replace advice given to you by your health care provider. Make sure you discuss any questions you have with your health care provider. Smoking Cessation,  Tips for Success If you are ready to quit smoking, congratulations! You have chosen to help yourself be healthier. Cigarettes bring nicotine, tar, carbon monoxide, and other irritants into your body. Your lungs, heart, and blood vessels will be able to work better without these poisons. There are many different ways to quit smoking. Nicotine gum, nicotine patches, a nicotine inhaler, or nicotine nasal spray can help with physical craving. Hypnosis, support groups, and medicines help break the habit of smoking. WHAT THINGS CAN I DO TO MAKE QUITTING EASIER?  Here are some tips to help you quit for good:  Pick a date when you will quit smoking completely. Tell all of your friends and family about your plan to quit on that date.  Do not try to slowly cut down on the number of cigarettes you are smoking. Pick a quit date and quit smoking completely starting on that day.  Throw away all cigarettes.   Clean and remove all ashtrays from your home, work, and car.  On a card, write down your reasons for quitting. Carry the card with you and read it when you get the urge to smoke.  Cleanse your body of nicotine. Drink enough water and fluids to keep your urine clear or pale yellow. Do this after quitting to flush the nicotine from your body.  Learn to predict your moods. Do not let a bad situation be your excuse to have a cigarette. Some situations in your life might tempt you into wanting a cigarette.  Never have "just one" cigarette. It leads to wanting another and another. Remind yourself of your decision to quit.  Change habits associated with smoking. If you smoked while driving or when feeling stressed, try other activities to replace smoking. Stand up when drinking your coffee. Brush your teeth after eating. Sit in a different chair when you read the paper. Avoid alcohol while trying to quit, and try to drink fewer caffeinated beverages. Alcohol and caffeine may urge you to smoke.  Avoid foods  and drinks that can trigger a desire to smoke, such as sugary or spicy foods and alcohol.  Ask people who smoke not to smoke around you.  Have something planned to do right after eating or having a cup of coffee. For example, plan to take a walk or exercise.  Try a relaxation exercise to calm you down and decrease your stress. Remember, you may be tense and nervous for the first 2 weeks after you quit, but this will pass.  Find new activities to keep your hands busy. Play with a pen, coin, or rubber band. Doodle or draw things on paper.  Brush your teeth right after eating. This will help cut down on the craving for the taste of tobacco after meals. You can also try mouthwash.   Use oral substitutes in place of cigarettes. Try using lemon drops, carrots, cinnamon sticks, or chewing gum. Keep them handy so they are available when you have the urge to smoke.  When you have the urge to smoke, try deep breathing.  Designate your home as a nonsmoking area.  If you are a heavy smoker, ask your health care provider about a prescription for nicotine chewing gum. It can ease your withdrawal from nicotine.  Reward yourself. Set aside the cigarette money you save and buy yourself something  nice.  Look for support from others. Join a support group or smoking cessation program. Ask someone at home or at work to help you with your plan to quit smoking.  Always ask yourself, "Do I need this cigarette or is this just a reflex?" Tell yourself, "Today, I choose not to smoke," or "I do not want to smoke." You are reminding yourself of your decision to quit.  Do not replace cigarette smoking with electronic cigarettes (commonly called e-cigarettes). The safety of e-cigarettes is unknown, and some may contain harmful chemicals.  If you relapse, do not give up! Plan ahead and think about what you will do the next time you get the urge to smoke. HOW WILL I FEEL WHEN I QUIT SMOKING? You may have symptoms of  withdrawal because your body is used to nicotine (the addictive substance in cigarettes). You may crave cigarettes, be irritable, feel very hungry, cough often, get headaches, or have difficulty concentrating. The withdrawal symptoms are only temporary. They are strongest when you first quit but will go away within 10-14 days. When withdrawal symptoms occur, stay in control. Think about your reasons for quitting. Remind yourself that these are signs that your body is healing and getting used to being without cigarettes. Remember that withdrawal symptoms are easier to treat than the major diseases that smoking can cause.  Even after the withdrawal is over, expect periodic urges to smoke. However, these cravings are generally short lived and will go away whether you smoke or not. Do not smoke! WHAT RESOURCES ARE AVAILABLE TO HELP ME QUIT SMOKING? Your health care provider can direct you to community resources or hospitals for support, which may include:  Group support.  Education.  Hypnosis.  Therapy. Document Released: 08/24/2004 Document Revised: 04/12/2014 Document Reviewed: 05/14/2013 [facility] Patient Information 2015 [place], [place]. This information is not intended to replace advice given to you by your health care provider. Make sure you discuss any questions you have with your health care provider.

## 2015-08-25 NOTE — Progress Notes (Signed)
BP 132/70 mmHg  Pulse 77  Temp(Src) 98.1 F (36.7 C)  Ht 5' 6.5" (1.689 m)  Wt 226 lb (102.513 kg)  BMI 35.94 kg/m2  SpO2 97%   Subjective:    Patient ID: Derek Cooley., male    DOB: [birth year], 49 y.o.   MRN: [id number]  HPI: [name]. is a 49 y.o. male  Chief Complaint  Patient presents with  . Pain    has been to ER, could not reconcile medications forgot list   He got to hurting so bad in his stomach and head, couldn't stand it; went to [facility] last week; they did blood work; he doesn't think any scans were done; they gave him sucralfate pills; that did help; he was also told to stop any OTC NSAIDs and alcohol, and he has had neither of those; having reflux and heartburn; the pill has helped  He has seen GI doctor; he is still having pain, "real bad"; nausea comes and goes; pain causes some nausea; no vomiting since ER; no blood in the stool; has had diarrhea back a month ago, normal now  Discussed foods: seldomly eats onion; not much chocolate; does smoke 1 to 1.5 ppd; does red hots just sometimes;   He has not yet seen psychiatrist; they went to the appt and they said they didn't know who he was and didn't have him on the schedule  He used to go to the pain clinic; [place] reviewed; not going to pain clinic any more; then he says they discharged him; they said he had some kind of stuff in his urine, they told him they were going to discharge him; he admits to using some hydrocodone  Flu shot UTD  Relevant past medical, surgical, family and social history reviewed and updated as indicated. Interim medical history since our last visit reviewed. Allergies and medications reviewed and updated.  Review of Systems  Per HPI unless specifically indicated above     Objective:    BP 132/70 mmHg  Pulse 77  Temp(Src) 98.1 F (36.7 C)  Ht 5' 6.5" (1.689 m)  Wt 226 lb (102.513 kg)  BMI 35.94 kg/m2  SpO2 97%  Wt Readings from Last 3 Encounters:  08/25/15 226 lb  (102.513 kg)  07/24/15 215 lb (97.523 kg)  07/12/15 221 lb 9.6 oz (100.517 kg)    Physical Exam  Constitutional: He appears well-developed and well-nourished. No distress.  HENT:  Head: Normocephalic and atraumatic.  Eyes: EOM are normal. No scleral icterus.  Neck: No thyromegaly present.  Cardiovascular: Normal rate and regular rhythm.   Pulmonary/Chest: Effort normal and breath sounds normal.  Abdominal: Soft. Bowel sounds are normal. He exhibits no distension. There is tenderness (epigastrium and RUQ).  Well-healed surgical scars  Musculoskeletal: He exhibits no edema.  Neurological: Coordination normal.  Skin: Skin is warm and dry. No pallor.  Psychiatric: He has a normal mood and affect. His behavior is normal. Judgment and thought content normal.  Good eye contact with examiner   Results for orders placed or performed during the hospital encounter of 07/24/15  Lipase, blood  Result Value Ref Range   Lipase 20 (L) 22 - 51 U/L  Comprehensive metabolic panel  Result Value Ref Range   Sodium 140 135 - 145 mmol/L   Potassium 3.9 3.5 - 5.1 mmol/L   Chloride 108 101 - 111 mmol/L   CO2 24 22 - 32 mmol/L   Glucose, Bld 132 (H) 65 - 99 mg/dL  BUN 14 6 - 20 mg/dL   Creatinine, Ser 1.52 (H) 0.61 - 1.24 mg/dL   Calcium 8.9 8.9 - 10.3 mg/dL   Total Protein 7.0 6.5 - 8.1 g/dL   Albumin 4.0 3.5 - 5.0 g/dL   AST 24 15 - 41 U/L   ALT 16 (L) 17 - 63 U/L   Alkaline Phosphatase 87 38 - 126 U/L   Total Bilirubin 0.4 0.3 - 1.2 mg/dL   GFR calc non Af Amer 52 (L) >60 mL/min   GFR calc Af Amer >60 >60 mL/min   Anion gap 8 5 - 15  CBC  Result Value Ref Range   WBC 10.2 3.8 - 10.6 K/uL   RBC 4.73 4.40 - 5.90 MIL/uL   Hemoglobin 13.9 13.0 - 18.0 g/dL   HCT 41.1 40.0 - 52.0 %   MCV 87.0 80.0 - 100.0 fL   MCH 29.5 26.0 - 34.0 pg   MCHC 33.9 32.0 - 36.0 g/dL   RDW 13.9 11.5 - 14.5 %   Platelets 291 150 - 440 K/uL  Urinalysis complete, with microscopic ([facility] only)  Result Value Ref  Range   Color, Urine STRAW (A) YELLOW   APPearance CLEAR (A) CLEAR   Glucose, UA NEGATIVE NEGATIVE mg/dL   Bilirubin Urine NEGATIVE NEGATIVE   Ketones, ur NEGATIVE NEGATIVE mg/dL   Specific Gravity, Urine 1.012 1.005 - 1.030   Hgb urine dipstick NEGATIVE NEGATIVE   pH 6.0 5.0 - 8.0   Protein, ur NEGATIVE NEGATIVE mg/dL   Nitrite NEGATIVE NEGATIVE   Leukocytes, UA NEGATIVE NEGATIVE   RBC / HPF NONE SEEN 0 - 5 RBC/hpf   WBC, UA NONE SEEN 0 - 5 WBC/hpf   Bacteria, UA NONE SEEN NONE SEEN   Squamous Epithelial / LPF 0-5 (A) NONE SEEN      Assessment & Plan:   Problem List Items Addressed This Visit      Cardiovascular and Mediastinum   Benign hypertension    Well-controlled today        Digestive   GERD (gastroesophageal reflux disease) - Primary    Avoid triggers; stop smoking; continue the sucralfate and PPI and H2 blocker        Genitourinary   CKD (chronic kidney disease) stage 2, GFR 60-89 ml/min    Continue the ACE-I; avoid dehydration; avoid NSAIDs        Other   Abdominal pain, chronic, epigastric    Cautions given; refer to another pain clinic      Relevant Orders   Ambulatory referral to Pain Clinic   Tobacco abuse    Encouraged to quit smoking          Follow up plan: Return in about 2 months (around 10/25/2015) for follow-up.  An after-visit summary was printed and given to the patient at [place].  Please see the patient instructions which may contain other information and recommendations beyond what is mentioned above in the assessment and plan.

## 2015-08-25 NOTE — Assessment & Plan Note (Signed)
Continue the ACE-I; avoid dehydration; avoid NSAIDs

## 2015-08-25 NOTE — Assessment & Plan Note (Signed)
Well controlled today.

## 2015-08-25 NOTE — Assessment & Plan Note (Signed)
Avoid triggers; stop smoking; continue the sucralfate and PPI and H2 blocker

## 2015-08-25 NOTE — Assessment & Plan Note (Signed)
Encouraged to quit smoking.  

## 2015-08-26 ENCOUNTER — Telehealth: Payer: Self-pay | Admitting: Family Medicine

## 2015-08-26 NOTE — Telephone Encounter (Signed)
Patient says he actually showed up for his appointment and they didn't know who he was or have him down for an appointment; it sounds like there was perhaps miscommunication and he went to the wrong clinic; not sure which end the error was on, but the clinic he went to wasn't expecting him

## 2015-09-05 DIAGNOSIS — H52223 Regular astigmatism, bilateral: Secondary | ICD-10-CM | POA: Diagnosis not present

## 2015-09-05 DIAGNOSIS — H524 Presbyopia: Secondary | ICD-10-CM | POA: Diagnosis not present

## 2015-09-05 DIAGNOSIS — H5213 Myopia, bilateral: Secondary | ICD-10-CM | POA: Diagnosis not present

## 2015-09-15 ENCOUNTER — Ambulatory Visit: Payer: Medicare Other | Admitting: Psychiatry

## 2015-09-27 ENCOUNTER — Other Ambulatory Visit: Payer: Self-pay | Admitting: Family Medicine

## 2015-09-27 NOTE — Telephone Encounter (Signed)
Routing to provider  

## 2015-09-27 NOTE — Telephone Encounter (Signed)
He should not be out until around November 14th or so; he also appears to have an outstanding psychiatry referral (that's why he takes this)

## 2015-10-10 ENCOUNTER — Other Ambulatory Visit: Payer: Self-pay | Admitting: Family Medicine

## 2015-10-10 NOTE — Telephone Encounter (Signed)
Routing to provider  

## 2015-10-12 ENCOUNTER — Encounter: Payer: Self-pay | Admitting: Psychiatry

## 2015-10-12 ENCOUNTER — Ambulatory Visit (INDEPENDENT_AMBULATORY_CARE_PROVIDER_SITE_OTHER): Payer: 59 | Admitting: Psychiatry

## 2015-10-12 VITALS — BP 180/102 | HR 90 | Temp 98.4°F | Ht 67.0 in | Wt 222.6 lb

## 2015-10-12 DIAGNOSIS — F339 Major depressive disorder, recurrent, unspecified: Secondary | ICD-10-CM

## 2015-10-12 DIAGNOSIS — F410 Panic disorder [episodic paroxysmal anxiety] without agoraphobia: Secondary | ICD-10-CM

## 2015-10-12 DIAGNOSIS — Z79899 Other long term (current) drug therapy: Secondary | ICD-10-CM | POA: Insufficient documentation

## 2015-10-12 MED ORDER — SERTRALINE HCL 100 MG PO TABS: 150.0000 mg | ORAL_TABLET | Freq: Every day | ORAL | Status: DC

## 2015-10-12 MED ORDER — ALPRAZOLAM 0.5 MG PO TABS: 0.5000 mg | ORAL_TABLET | Freq: Two times a day (BID) | ORAL | Status: DC | PRN
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
## 2015-10-17 ENCOUNTER — Other Ambulatory Visit: Payer: Self-pay | Admitting: Family Medicine

## 2015-10-17 NOTE — Telephone Encounter (Signed)
Routing to provider  

## 2015-10-21 ENCOUNTER — Encounter: Payer: Self-pay | Admitting: Family Medicine

## 2015-10-21 ENCOUNTER — Ambulatory Visit (INDEPENDENT_AMBULATORY_CARE_PROVIDER_SITE_OTHER): Payer: Medicare Other | Admitting: Family Medicine

## 2015-10-21 VITALS — BP 174/109 | Temp 98.3°F | Resp 93 | Ht 67.0 in | Wt 228.6 lb

## 2015-10-21 DIAGNOSIS — G8929 Other chronic pain: Secondary | ICD-10-CM | POA: Diagnosis not present

## 2015-10-21 DIAGNOSIS — R1013 Epigastric pain: Secondary | ICD-10-CM | POA: Diagnosis not present

## 2015-10-21 DIAGNOSIS — R197 Diarrhea, unspecified: Secondary | ICD-10-CM

## 2015-10-21 DIAGNOSIS — Z792 Long term (current) use of antibiotics: Secondary | ICD-10-CM

## 2015-10-21 MED ORDER — KETOROLAC TROMETHAMINE 60 MG/2ML IM SOLN
30.0000 mg | Freq: Once | INTRAMUSCULAR | Status: AC
  Administered 2015-10-21: 30 mg via INTRAMUSCULAR

## 2015-10-21 NOTE — Progress Notes (Signed)
BP 174/109 mmHg  Temp(Src) 98.3 F (36.8 C)  Resp 93  Ht 5\' 7"  (1.702 m)  Wt 228 lb 9.6 oz (103.692 kg)  BMI 35.80 kg/m2  SpO2 99%   Subjective:    Patient ID: Derek Cooley., male    DOB: [birth year], 49 y.o.   MRN: [id number]  HPI: Derek Cooley. is a 49 y.o. male  Chief Complaint  Patient presents with  . Abdominal Pain    Pain Score 8  . Headache   History of chronic abdominal pain. Has seen GI. GI thought it was musculoskeletal pain and was advised to use warm compresses and anti-inflammatory medications for the pain. Has been to the ER a couple of times because of this (last in August). Had CT done in August which showed mild duodenitis/antritis. Was started on antacid and sucralfate. Told to avoid NSAIDs and follow up here. Saw PCP 1 month later. Having bad heartburn and reflux at that time.  Was supposed to see the pain clinic for the abdominal pain and was referred to one on 08/25/15. Referred to Dr. Dossie Arbour, but he left shortly after that referral was generated.   Acid reflux is not bothering him this time  ABDOMINAL PAIN- same as his usual pain, but worse.  Duration: 2-3weeks Onset: gradual Severity: 8/10 Quality: dull and aching Location:  epigastric and diffuse  Episode duration:  Radiation: no Frequency: constant Alleviating factors:  Aggravating factors: Status: worse Treatments attempted: antacids, PPI and H2 Blocker, sulcralfate, tylenol Fever: no Nausea: yes Vomiting: yes- yesterday Weight loss: no Decreased appetite: yes Diarrhea: yes Constipation: no Blood in stool: no Heartburn: no Jaundice: no Rash: no Dysuria/urinary frequency: no Hematuria: no History of sexually transmitted disease: no Recurrent NSAID use: no  Relevant past medical, surgical, family and social history reviewed and updated as indicated. Interim medical history since our last visit reviewed. Allergies and medications reviewed and updated.  Review of Systems   Constitutional: Negative.   Respiratory: Negative.   Cardiovascular: Negative.   Gastrointestinal: Positive for nausea, vomiting, abdominal pain and diarrhea. Negative for constipation, blood in stool, abdominal distention, anal bleeding and rectal pain.  Genitourinary: Negative.   Musculoskeletal: Negative.   Psychiatric/Behavioral: Negative.     Per HPI unless specifically indicated above     Objective:    BP 174/109 mmHg  Temp(Src) 98.3 F (36.8 C)  Resp 93  Ht 5\' 7"  (1.702 m)  Wt 228 lb 9.6 oz (103.692 kg)  BMI 35.80 kg/m2  SpO2 99%  Wt Readings from Last 3 Encounters:  10/21/15 228 lb 9.6 oz (103.692 kg)  10/12/15 222 lb 9.6 oz (100.971 kg)  08/25/15 226 lb (102.513 kg)    Physical Exam  Constitutional: He is oriented to person, place, and time. He appears well-developed and well-nourished. No distress.  HENT:  Head: Normocephalic and atraumatic.  Right Ear: Hearing normal.  Left Ear: Hearing normal.  Nose: Nose normal.  Eyes: Conjunctivae and lids are normal. Right eye exhibits no discharge. Left eye exhibits no discharge. No scleral icterus.  Cardiovascular: Normal rate, regular rhythm, normal heart sounds and intact distal pulses.  Exam reveals no gallop and no friction rub.   No murmur heard. Pulmonary/Chest: Effort normal and breath sounds normal. No respiratory distress. He has no wheezes. He has no rales. He exhibits no tenderness.  Abdominal: Soft. Bowel sounds are normal. He exhibits no distension and no mass. There is no hepatosplenomegaly. There is tenderness in the right lower quadrant, epigastric  area and left lower quadrant. There is guarding. There is no rebound, no CVA tenderness, no tenderness at McBurney's point and negative Murphy's sign.  Musculoskeletal: Normal range of motion.  Neurological: He is alert and oriented to person, place, and time.  Skin: Skin is warm, dry and intact. No rash noted. No erythema. No pallor.  Psychiatric: He has a  normal mood and affect. His speech is normal and behavior is normal. Judgment and thought content normal. Cognition and memory are normal.  Nursing note and vitals reviewed.   Results for orders placed or performed during the hospital encounter of 07/24/15  Lipase, blood  Result Value Ref Range   Lipase 20 (L) 22 - 51 U/L  Comprehensive metabolic panel  Result Value Ref Range   Sodium 140 135 - 145 mmol/L   Potassium 3.9 3.5 - 5.1 mmol/L   Chloride 108 101 - 111 mmol/L   CO2 24 22 - 32 mmol/L   Glucose, Bld 132 (H) 65 - 99 mg/dL   BUN 14 6 - 20 mg/dL   Creatinine, Ser 1.52 (H) 0.61 - 1.24 mg/dL   Calcium 8.9 8.9 - 10.3 mg/dL   Total Protein 7.0 6.5 - 8.1 g/dL   Albumin 4.0 3.5 - 5.0 g/dL   AST 24 15 - 41 U/L   ALT 16 (L) 17 - 63 U/L   Alkaline Phosphatase 87 38 - 126 U/L   Total Bilirubin 0.4 0.3 - 1.2 mg/dL   GFR calc non Af Amer 52 (L) >60 mL/min   GFR calc Af Amer >60 >60 mL/min   Anion gap 8 5 - 15  CBC  Result Value Ref Range   WBC 10.2 3.8 - 10.6 K/uL   RBC 4.73 4.40 - 5.90 MIL/uL   Hemoglobin 13.9 13.0 - 18.0 g/dL   HCT 41.1 40.0 - 52.0 %   MCV 87.0 80.0 - 100.0 fL   MCH 29.5 26.0 - 34.0 pg   MCHC 33.9 32.0 - 36.0 g/dL   RDW 13.9 11.5 - 14.5 %   Platelets 291 150 - 440 K/uL  Urinalysis complete, with microscopic ([facility] only)  Result Value Ref Range   Color, Urine STRAW (A) YELLOW   APPearance CLEAR (A) CLEAR   Glucose, UA NEGATIVE NEGATIVE mg/dL   Bilirubin Urine NEGATIVE NEGATIVE   Ketones, ur NEGATIVE NEGATIVE mg/dL   Specific Gravity, Urine 1.012 1.005 - 1.030   Hgb urine dipstick NEGATIVE NEGATIVE   pH 6.0 5.0 - 8.0   Protein, ur NEGATIVE NEGATIVE mg/dL   Nitrite NEGATIVE NEGATIVE   Leukocytes, UA NEGATIVE NEGATIVE   RBC / HPF NONE SEEN 0 - 5 RBC/hpf   WBC, UA NONE SEEN 0 - 5 WBC/hpf   Bacteria, UA NONE SEEN NONE SEEN   Squamous Epithelial / LPF 0-5 (A) NONE SEEN   Records from PCP, GI and psychiatry reviewed today     Assessment & Plan:    Problem List Items Addressed This Visit      Other   Abdominal pain, chronic, epigastric - Primary    Chronic, but given increase in pain and now with diarrhea, concern for possible c-diff. Will check labs including pancreas labs. Toradol shot given today for pain control. Continue current regimen, including sucralfate. Await stool studies. If pain gets worse over the weekend, go to the ER. Return to see PCP as scheduled on Monday. Son needs letter stating he is going to be father's primary care giver- letter generated today. Do not want to  give him any opiate medication with the concern of constipation with potential c diff colitis and the history of bowel perforation. He is aware and understands. Jonelle Sidle Foxx is looking into pain center referral, and will call [facility] as [facility] will not see him.       Relevant Medications   ketorolac (TORADOL) injection 30 mg   Other Relevant Orders   Comprehensive metabolic panel   CBC with Differential/Platelet   Amylase   Lipase   Stool Culture   Ova and parasite examination   Stool C-Diff Toxin Assay   Fecal leukocytes    Other Visit Diagnoses    Diarrhea, unspecified type        Will check stool studies, including c. diff. Concerned for c. diff given chronic doxycycline use. Await results.     Relevant Orders    Stool Culture    Ova and parasite examination    Stool C-Diff Toxin Assay    Fecal leukocytes    Chronic antibiotic suppression        Concern for C. diff given diarrhea and chronic doxy use.     Relevant Orders    Stool Culture    Ova and parasite examination    Stool C-Diff Toxin Assay    Fecal leukocytes        Follow up plan: Return for Monday.

## 2015-10-21 NOTE — Assessment & Plan Note (Signed)
Chronic, but given increase in pain and now with diarrhea, concern for possible c-diff. Will check labs including pancreas labs. Toradol shot given today for pain control. Continue current regimen, including sucralfate. Await stool studies. If pain gets worse over the weekend, go to the ER. Return to see PCP as scheduled on Monday. Son needs letter stating he is going to be father's primary care giver- letter generated today. Do not want to give him any opiate medication with the concern of constipation with potential c diff colitis and the history of bowel perforation. He is aware and understands. Jonelle Sidle Foxx is looking into pain center referral, and will call [facility] as [facility] will not see him.

## 2015-10-22 LAB — COMPREHENSIVE METABOLIC PANEL
A/G RATIO: 1.9 (ref 1.1–2.5)
ALBUMIN: 4.1 g/dL (ref 3.5–5.5)
ALT: 37 IU/L (ref 0–44)
AST: 22 IU/L (ref 0–40)
Alkaline Phosphatase: 94 IU/L (ref 39–117)
BILIRUBIN TOTAL: 0.5 mg/dL (ref 0.0–1.2)
BUN/Creatinine Ratio: 17 (ref 9–20)
BUN: 19 mg/dL (ref 6–24)
CALCIUM: 9.3 mg/dL (ref 8.7–10.2)
CO2: 20 mmol/L (ref 18–29)
CREATININE: 1.1 mg/dL (ref 0.76–1.27)
Chloride: 109 mmol/L — ABNORMAL HIGH (ref 97–106)
GFR calc Af Amer: 91 mL/min/{1.73_m2} (ref >59)
GFR, EST NON AFRICAN AMERICAN: 78 mL/min/{1.73_m2} (ref >59)
GLUCOSE: 94 mg/dL (ref 65–99)
Globulin, Total: 2.2 g/dL (ref 1.5–4.5)
Potassium: 4.5 mmol/L (ref 3.5–5.2)
Sodium: 141 mmol/L (ref 136–144)
TOTAL PROTEIN: 6.3 g/dL (ref 6.0–8.5)

## 2015-10-22 LAB — CBC WITH DIFFERENTIAL/PLATELET
BASOS: 1 %
Basophils Absolute: 0.1 10*3/uL (ref 0.0–0.2)
EOS (ABSOLUTE): 0.4 10*3/uL (ref 0.0–0.4)
EOS: 4 %
HEMOGLOBIN: 14.9 g/dL (ref 12.6–17.7)
Hematocrit: 43.2 % (ref 37.5–51.0)
IMMATURE GRANS (ABS): 0 10*3/uL (ref 0.0–0.1)
IMMATURE GRANULOCYTES: 0 %
LYMPHS: 33 %
Lymphocytes Absolute: 3.2 10*3/uL — ABNORMAL HIGH (ref 0.7–3.1)
MCH: 30.7 pg (ref 26.6–33.0)
MCHC: 34.5 g/dL (ref 31.5–35.7)
MCV: 89 fL (ref 79–97)
MONOCYTES: 9 %
Monocytes Absolute: 0.9 10*3/uL (ref 0.1–0.9)
Neutrophils Absolute: 5.1 10*3/uL (ref 1.4–7.0)
Neutrophils: 53 %
PLATELETS: 250 10*3/uL (ref 150–379)
RBC: 4.85 x10E6/uL (ref 4.14–5.80)
RDW: 13.2 % (ref 12.3–15.4)
WBC: 9.6 10*3/uL (ref 3.4–10.8)

## 2015-10-22 LAB — LIPASE: Lipase: 25 U/L (ref 0–59)

## 2015-10-22 LAB — AMYLASE: Amylase: 27 U/L — ABNORMAL LOW (ref 31–124)

## 2015-10-24 ENCOUNTER — Ambulatory Visit: Payer: Medicare Other | Admitting: Family Medicine

## 2015-10-25 ENCOUNTER — Telehealth: Payer: Self-pay | Admitting: Family Medicine

## 2015-10-25 NOTE — Telephone Encounter (Signed)
Please let him know that his labs came back normal. Once we get the stool results back we'll know more. He should reschedule that appointment with Dr. Sanda Klein ASAP.

## 2015-10-25 NOTE — Telephone Encounter (Signed)
Called and spoke to a lady who stated the patient was not in right now. I asked for the patient to please call me back when he could.

## 2015-10-25 NOTE — Telephone Encounter (Signed)
Patient returned call and I let him know what Dr. Wynetta Emery said. I asked the patient if he would like to schedule with Dr. Sanda Klein now and he stated he would just call back to schedule because of transportation issues.

## 2015-10-26 ENCOUNTER — Ambulatory Visit: Payer: Medicare Other | Admitting: Family Medicine

## 2015-10-31 ENCOUNTER — Ambulatory Visit: Payer: Self-pay | Admitting: Family Medicine

## 2015-11-09 ENCOUNTER — Ambulatory Visit: Payer: Medicare Other | Admitting: Psychiatry

## 2015-11-15 ENCOUNTER — Telehealth: Payer: Self-pay

## 2015-11-15 NOTE — Telephone Encounter (Signed)
I am so sorry I had to run off today, but the corrections staff should be able to take care of this request

## 2015-11-15 NOTE — Telephone Encounter (Signed)
Patient's sister would like to speak with you in regards to patient. He is currently in jail, but they need advice on getting him committed.

## 2015-11-16 NOTE — Telephone Encounter (Signed)
Patient's daughter notified.

## 2015-12-16 ENCOUNTER — Other Ambulatory Visit: Payer: Self-pay | Admitting: Psychiatry

## 2015-12-25 IMAGING — US US ABDOMEN LIMITED
1 series · 14 of 25 positions shown · non-contrast
Comparison: None.

CLINICAL DATA: Right upper quadrant pain

EXAM:
US ABDOMEN LIMITED - RIGHT UPPER QUADRANT

[Series 1: us abdomen limited · 0.24mm/px · 14 of 39 slices shown]
[im 1/39]
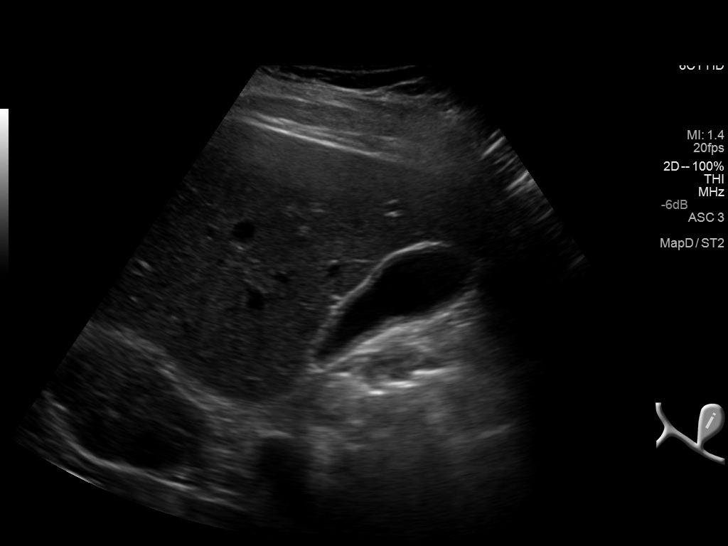
[im 4/39]
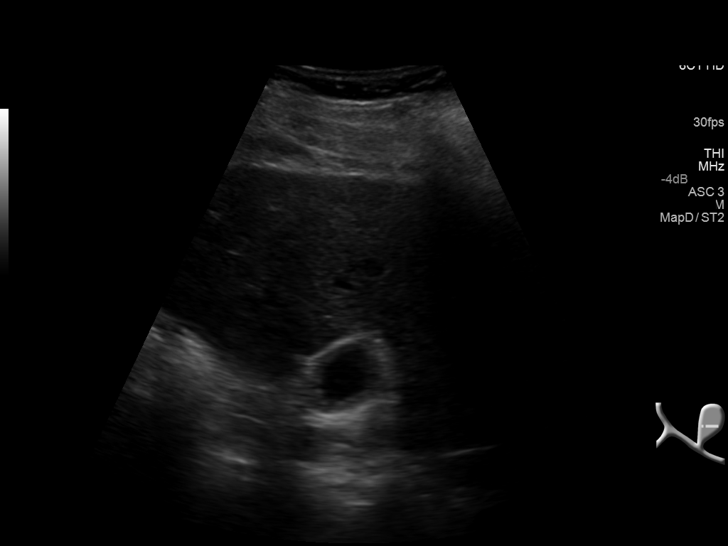
[im 7/39]
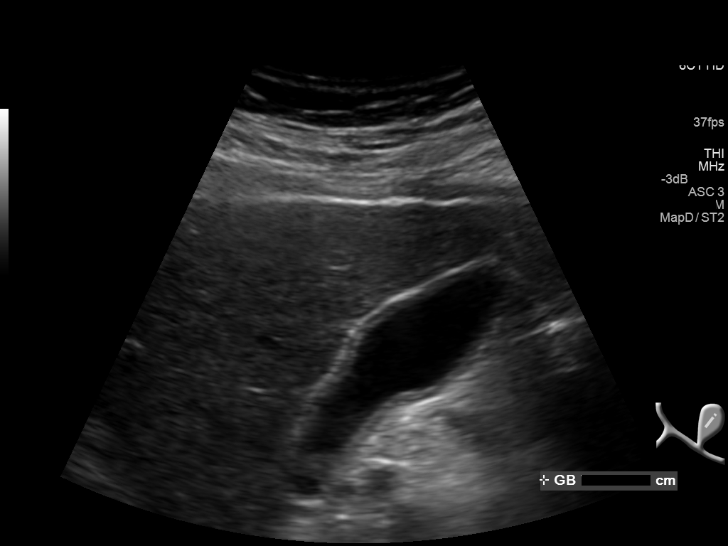
[im 10/39]
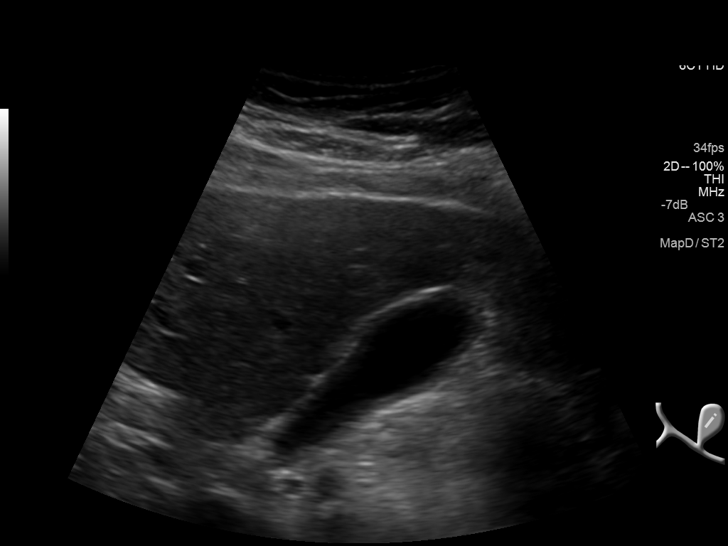
[im 13/39]
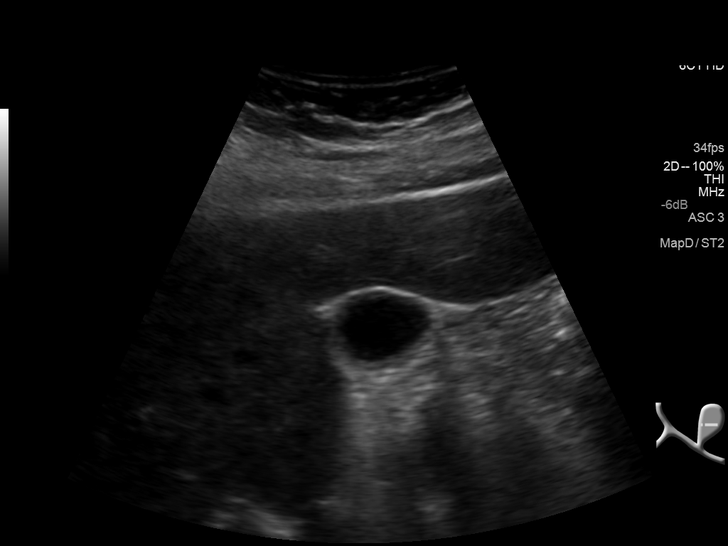
[im 15/39]
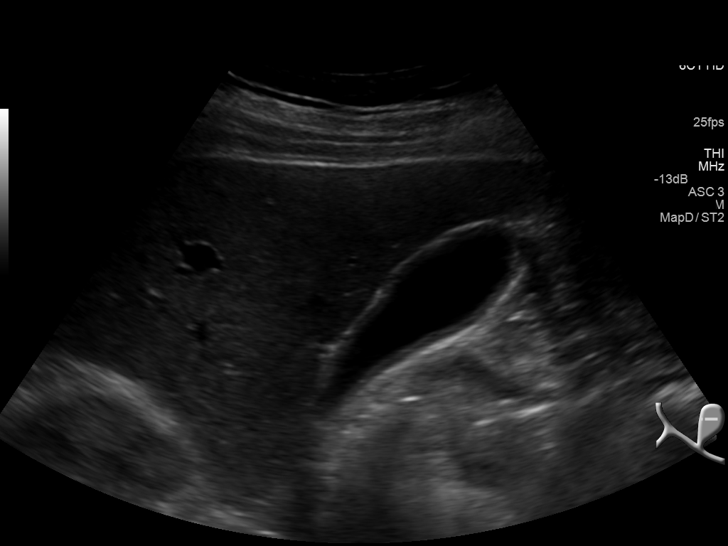
[im 18/39]
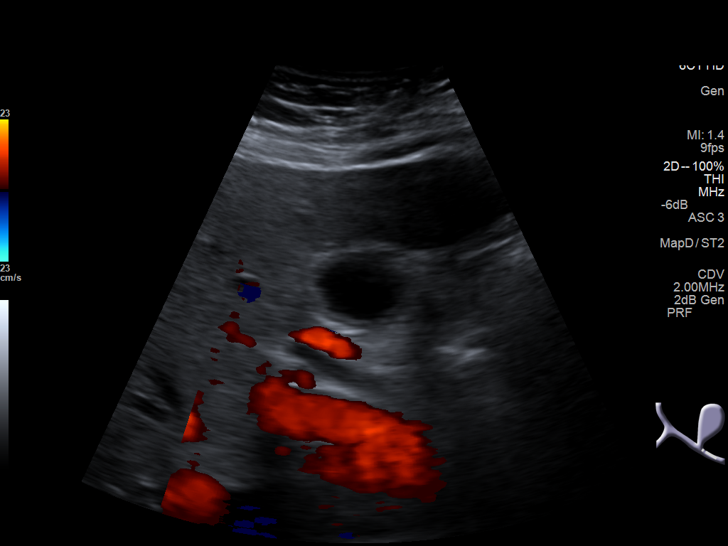
[im 21/39]
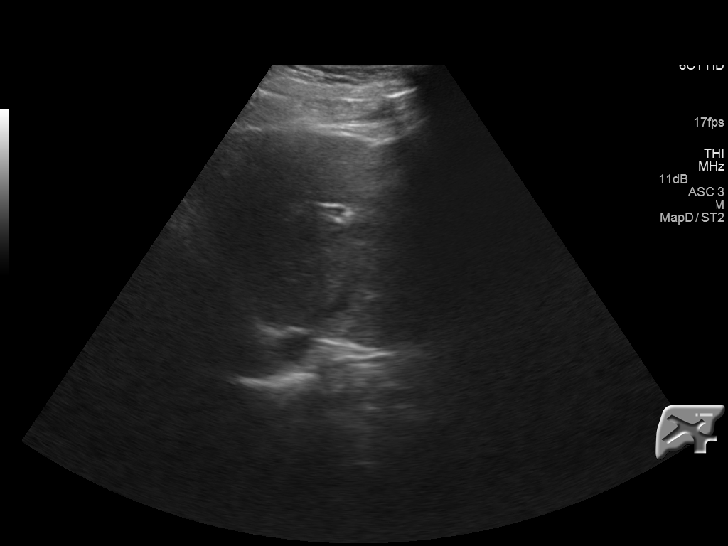
[im 24/39]
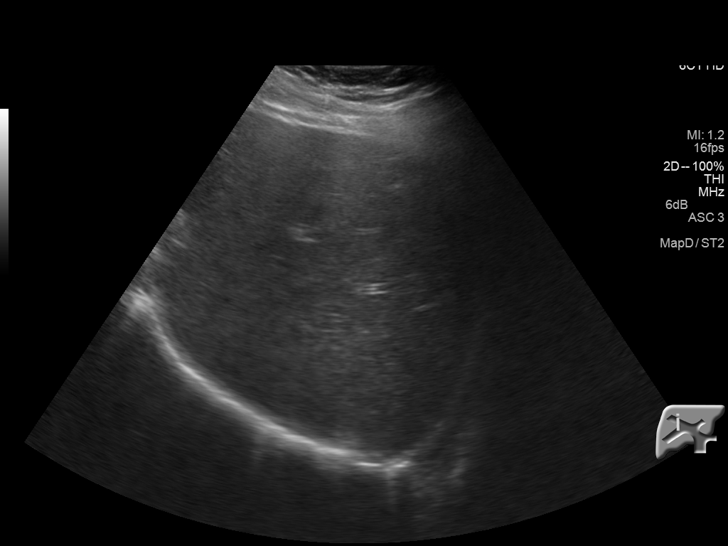
[im 26/39]
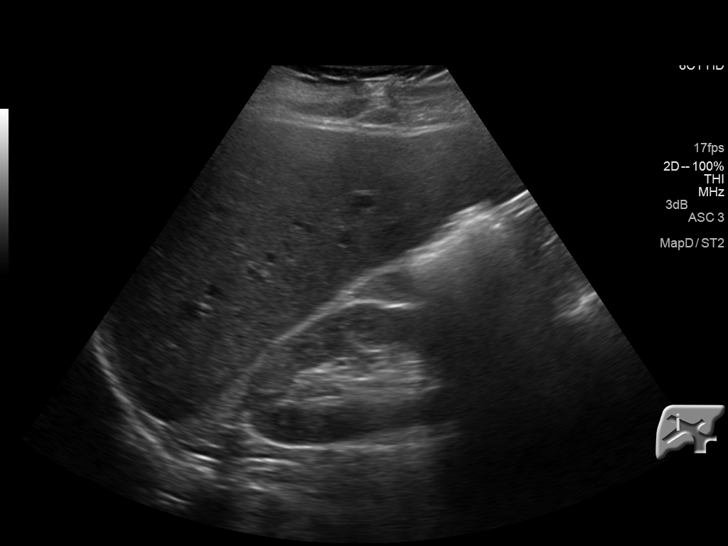
[im 29/39]
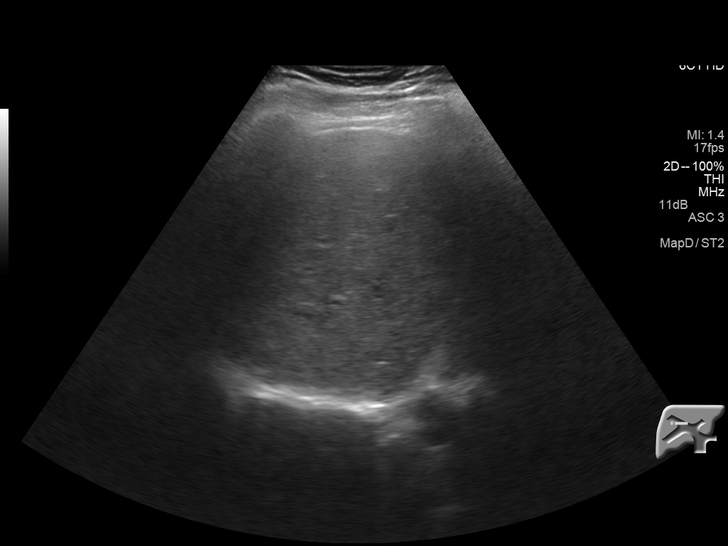
[im 32/39]
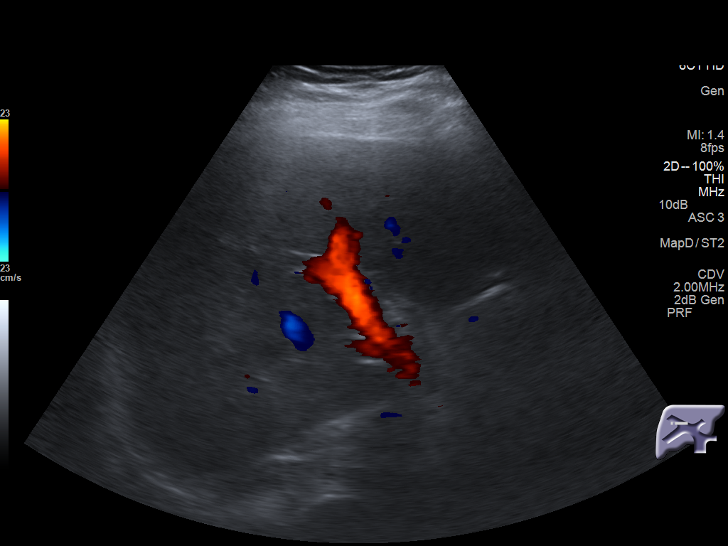
[im 35/39]
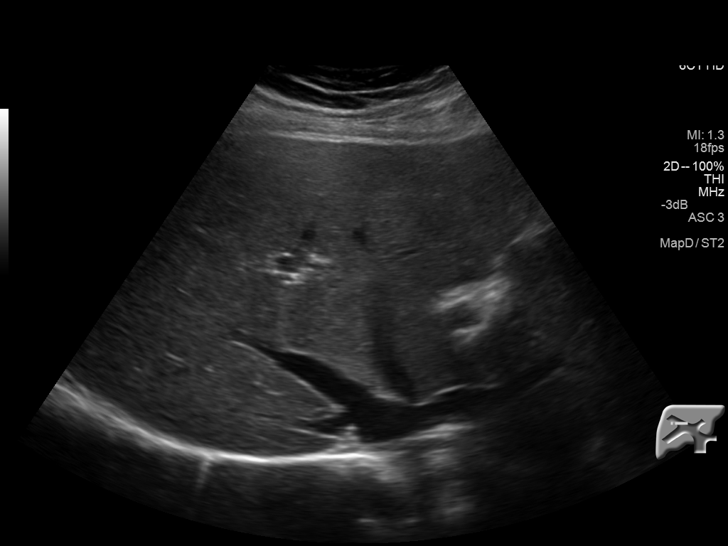
[im 39/39]
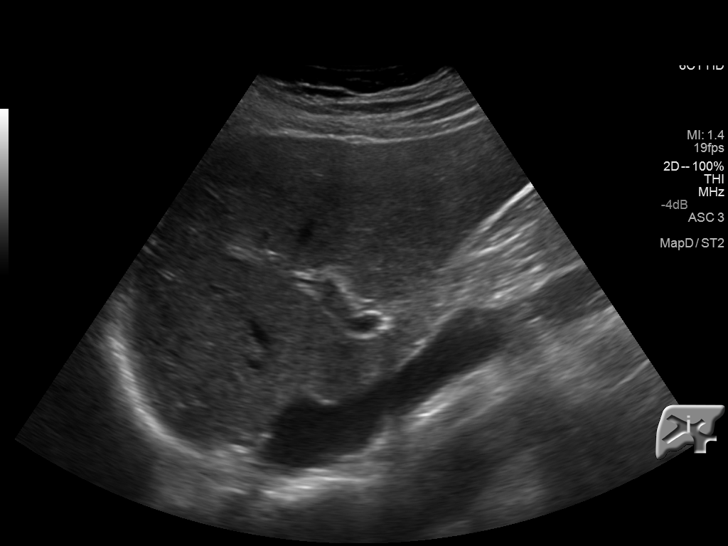

[14 of 25 positions shown; findings below may reference images not displayed]

FINDINGS: Gallbladder:

No gallstones or wall thickening visualized. No sonographic Murphy
sign noted.

Common bile duct:

Diameter: 5.2 mm.

Liver:

No focal lesion identified. Within normal limits in parenchymal
echogenicity.
IMPRESSION: No acute abnormality noted.

## 2016-01-10 ENCOUNTER — Encounter: Payer: Self-pay | Admitting: Psychiatry

## 2016-01-10 ENCOUNTER — Ambulatory Visit (INDEPENDENT_AMBULATORY_CARE_PROVIDER_SITE_OTHER): Payer: 59 | Admitting: Psychiatry

## 2016-01-10 VITALS — BP 170/98 | HR 89 | Temp 98.0°F | Ht 67.5 in | Wt 221.2 lb

## 2016-01-10 DIAGNOSIS — F410 Panic disorder [episodic paroxysmal anxiety] without agoraphobia: Secondary | ICD-10-CM

## 2016-01-10 DIAGNOSIS — F339 Major depressive disorder, recurrent, unspecified: Secondary | ICD-10-CM | POA: Diagnosis not present

## 2016-01-10 MED ORDER — ALPRAZOLAM 1 MG PO TABS
1.0000 mg | ORAL_TABLET | Freq: Two times a day (BID) | ORAL | Status: DC | PRN
Start: 2016-01-10 — End: 2016-05-09

## 2016-01-10 MED ORDER — SERTRALINE HCL 100 MG PO TABS: 200.0000 mg | ORAL_TABLET | Freq: Every day | ORAL | Status: DC
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
## 2016-02-07 ENCOUNTER — Ambulatory Visit: Payer: Medicare Other | Admitting: Psychiatry

## 2016-04-17 ENCOUNTER — Ambulatory Visit (INDEPENDENT_AMBULATORY_CARE_PROVIDER_SITE_OTHER): Payer: Medicare Other | Admitting: Family Medicine

## 2016-04-17 ENCOUNTER — Encounter: Payer: Self-pay | Admitting: Family Medicine

## 2016-04-17 VITALS — BP 192/116 | HR 88 | Temp 98.6°F | Ht 67.0 in | Wt 227.0 lb

## 2016-04-17 DIAGNOSIS — Z720 Tobacco use: Secondary | ICD-10-CM

## 2016-04-17 DIAGNOSIS — G4733 Obstructive sleep apnea (adult) (pediatric): Secondary | ICD-10-CM

## 2016-04-17 DIAGNOSIS — R109 Unspecified abdominal pain: Secondary | ICD-10-CM

## 2016-04-17 DIAGNOSIS — K921 Melena: Secondary | ICD-10-CM

## 2016-04-17 DIAGNOSIS — F32A Depression, unspecified: Secondary | ICD-10-CM

## 2016-04-17 DIAGNOSIS — E7800 Pure hypercholesterolemia, unspecified: Secondary | ICD-10-CM | POA: Diagnosis not present

## 2016-04-17 DIAGNOSIS — R51 Headache: Secondary | ICD-10-CM

## 2016-04-17 DIAGNOSIS — J441 Chronic obstructive pulmonary disease with (acute) exacerbation: Secondary | ICD-10-CM | POA: Insufficient documentation

## 2016-04-17 DIAGNOSIS — F068 Other specified mental disorders due to known physiological condition: Secondary | ICD-10-CM

## 2016-04-17 DIAGNOSIS — T8579XA Infection and inflammatory reaction due to other internal prosthetic devices, implants and grafts, initial encounter: Secondary | ICD-10-CM

## 2016-04-17 DIAGNOSIS — Z113 Encounter for screening for infections with a predominantly sexual mode of transmission: Secondary | ICD-10-CM

## 2016-04-17 DIAGNOSIS — K219 Gastro-esophageal reflux disease without esophagitis: Secondary | ICD-10-CM | POA: Diagnosis not present

## 2016-04-17 DIAGNOSIS — F329 Major depressive disorder, single episode, unspecified: Secondary | ICD-10-CM

## 2016-04-17 DIAGNOSIS — I129 Hypertensive chronic kidney disease with stage 1 through stage 4 chronic kidney disease, or unspecified chronic kidney disease: Secondary | ICD-10-CM | POA: Diagnosis not present

## 2016-04-17 DIAGNOSIS — L02211 Cutaneous abscess of abdominal wall: Secondary | ICD-10-CM | POA: Diagnosis not present

## 2016-04-17 DIAGNOSIS — Z125 Encounter for screening for malignant neoplasm of prostate: Secondary | ICD-10-CM

## 2016-04-17 DIAGNOSIS — E119 Type 2 diabetes mellitus without complications: Secondary | ICD-10-CM | POA: Diagnosis not present

## 2016-04-17 DIAGNOSIS — R519 Headache, unspecified: Secondary | ICD-10-CM

## 2016-04-17 DIAGNOSIS — F419 Anxiety disorder, unspecified: Secondary | ICD-10-CM

## 2016-04-17 DIAGNOSIS — J449 Chronic obstructive pulmonary disease, unspecified: Secondary | ICD-10-CM | POA: Insufficient documentation

## 2016-04-17 DIAGNOSIS — F09 Unspecified mental disorder due to known physiological condition: Secondary | ICD-10-CM

## 2016-04-17 DIAGNOSIS — G8929 Other chronic pain: Secondary | ICD-10-CM

## 2016-04-17 DIAGNOSIS — E1122 Type 2 diabetes mellitus with diabetic chronic kidney disease: Secondary | ICD-10-CM | POA: Insufficient documentation

## 2016-04-17 HISTORY — DX: Type 2 diabetes mellitus without complications: E11.9

## 2016-04-17 LAB — CBC WITH DIFFERENTIAL/PLATELET
HEMOGLOBIN: 15.3 g/dL (ref 12.6–17.7)
Hematocrit: 44 % (ref 37.5–51.0)
LYMPHS ABS: 2.3 10*3/uL (ref 0.7–3.1)
Lymphs: 30 %
MCH: 32.1 pg (ref 26.6–33.0)
MCHC: 34.8 g/dL (ref 31.5–35.7)
MCV: 92 fL (ref 79–97)
MID (Absolute): 0.8 10*3/uL (ref 0.1–1.6)
MID: 10 %
NEUTROS ABS: 4.7 10*3/uL (ref 1.4–7.0)
NEUTROS PCT: 60 %
PLATELETS: 242 10*3/uL (ref 150–379)
RBC: 4.76 x10E6/uL (ref 4.14–5.80)
RDW: 13.4 % (ref 12.3–15.4)
WBC: 7.8 10*3/uL (ref 3.4–10.8)

## 2016-04-17 LAB — UA/M W/RFLX CULTURE, ROUTINE
Bilirubin, UA: NEGATIVE
Glucose, UA: NEGATIVE
KETONES UA: NEGATIVE
Leukocytes, UA: NEGATIVE
NITRITE UA: NEGATIVE
PH UA: 6.5 (ref 5.0–7.5)
PROTEIN UA: NEGATIVE
RBC UA: NEGATIVE
SPEC GRAV UA: 1.01 (ref 1.005–1.030)
UUROB: 0.2 mg/dL (ref 0.2–1.0)

## 2016-04-17 LAB — MICROALBUMIN, URINE WAIVED
Creatinine, Urine Waived: 200 mg/dL (ref 10–300)
MICROALB, UR WAIVED: 30 mg/L — AB (ref 0–19)
Microalb/Creat Ratio: <30 mg/g (ref <30)

## 2016-04-17 LAB — IFOBT (OCCULT BLOOD): IFOBT: NEGATIVE

## 2016-04-17 LAB — BAYER DCA HB A1C WAIVED: HB A1C (BAYER DCA - WAIVED): 5.6 % (ref <7.0)

## 2016-04-17 MED ORDER — FLUTICASONE PROPIONATE 50 MCG/ACT NA SUSP: 2.0000 | Freq: Every day | NASAL | Status: DC

## 2016-04-17 MED ORDER — FLUTICASONE-SALMETEROL 250-50 MCG/DOSE IN AEPB: INHALATION_SPRAY | RESPIRATORY_TRACT | Status: DC

## 2016-04-17 MED ORDER — ATORVASTATIN CALCIUM 10 MG PO TABS: 10.0000 mg | ORAL_TABLET | Freq: Every day | ORAL | Status: DC

## 2016-04-17 MED ORDER — LISINOPRIL 30 MG PO TABS: 30.0000 mg | ORAL_TABLET | Freq: Every day | ORAL | Status: DC

## 2016-04-17 MED ORDER — SERTRALINE HCL 100 MG PO TABS: 200.0000 mg | ORAL_TABLET | Freq: Every day | ORAL | Status: DC

## 2016-04-17 MED ORDER — ALBUTEROL SULFATE HFA 108 (90 BASE) MCG/ACT IN AERS: 1.0000 | INHALATION_SPRAY | Freq: Four times a day (QID) | RESPIRATORY_TRACT | Status: DC | PRN

## 2016-04-17 MED ORDER — OMEPRAZOLE 40 MG PO CPDR: 40.0000 mg | DELAYED_RELEASE_CAPSULE | Freq: Every day | ORAL | Status: DC

## 2016-04-17 NOTE — Assessment & Plan Note (Signed)
Not under good control. Will restart his lisinopril and recheck in 2 weeks, if still high will restart his amlodipine. Work on diet and exercise. Continue to monitor.
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
## 2016-04-17 NOTE — Assessment & Plan Note (Signed)
Referral back to the pain clinic made today. He is aware that he will need to see them for chronic pain medication. Has not been on anything in several months.

## 2016-04-17 NOTE — Assessment & Plan Note (Signed)
Encouraged patient to quit smoking. Will offer medication if needed.

## 2016-04-17 NOTE — Assessment & Plan Note (Signed)
Continue to follow with psychiatry. Going to run out of his zoloft, so refill given today- will call his psychiatrist for further refills and further management.

## 2016-04-17 NOTE — Assessment & Plan Note (Signed)
Stable on omeprazole. Refill given today. Would likely benefit from CPAP. Will get him back in to see GI- referral generated today. Continue to monitor closely.

## 2016-04-17 NOTE — Assessment & Plan Note (Signed)
Has been off of his mediation. Will restart it and check spiro next visit. Call with any concerns.

## 2016-04-17 NOTE — Assessment & Plan Note (Signed)
To see pain management. Referral generated today. Avoid NSAIDs at this time due to melena.

## 2016-04-17 NOTE — Assessment & Plan Note (Signed)
A1c 5.6 today. No DM, no pre-DM at this time. Continue diet and exercise. Will continue to monitor.

## 2016-04-17 NOTE — Assessment & Plan Note (Signed)
Has been off of his medication. Rechecking levels today. Restarting medicine. Will recheck again when he is back on his medication.

## 2016-04-17 NOTE — Assessment & Plan Note (Signed)
Has not been on CPAP in quite a while. BP very high. Will refer to sleep medicine for repeat sleep study and titration of CPAP.

## 2016-04-17 NOTE — Assessment & Plan Note (Signed)
Sister is helping coordinate his medical care. Seeing psychiatry. Continue to monitor.

## 2016-04-17 NOTE — Assessment & Plan Note (Signed)
Has been off of his medication for at least 2 months. Referral back to ID made today. Await results.

## 2016-04-18 ENCOUNTER — Encounter: Payer: Self-pay | Admitting: Family Medicine

## 2016-04-24 LAB — COMPREHENSIVE METABOLIC PANEL
ALT: 10 IU/L (ref 0–44)
AST: 15 IU/L (ref 0–40)
Albumin/Globulin Ratio: 1.8 (ref 1.2–2.2)
Albumin: 4.2 g/dL (ref 3.5–5.5)
Alkaline Phosphatase: 111 IU/L (ref 39–117)
BILIRUBIN TOTAL: 0.2 mg/dL (ref 0.0–1.2)
BUN/Creatinine Ratio: 10 (ref 9–20)
BUN: 13 mg/dL (ref 6–24)
CALCIUM: 9.5 mg/dL (ref 8.7–10.2)
CHLORIDE: 101 mmol/L (ref 96–106)
CO2: 22 mmol/L (ref 18–29)
Creatinine, Ser: 1.27 mg/dL (ref 0.76–1.27)
GFR calc non Af Amer: 65 mL/min/{1.73_m2} (ref >59)
GFR, EST AFRICAN AMERICAN: 76 mL/min/{1.73_m2} (ref >59)
GLUCOSE: 105 mg/dL — AB (ref 65–99)
Globulin, Total: 2.3 g/dL (ref 1.5–4.5)
Potassium: 4.8 mmol/L (ref 3.5–5.2)
Sodium: 142 mmol/L (ref 134–144)
TOTAL PROTEIN: 6.5 g/dL (ref 6.0–8.5)

## 2016-04-24 LAB — LIPID PANEL W/O CHOL/HDL RATIO
Cholesterol, Total: 183 mg/dL (ref 100–199)
HDL: 29 mg/dL — AB (ref >39)
TRIGLYCERIDES: 551 mg/dL — AB (ref 0–149)

## 2016-04-24 LAB — TSH: TSH: 1.71 u[IU]/mL (ref 0.450–4.500)

## 2016-04-24 LAB — PSA: PROSTATE SPECIFIC AG, SERUM: 0.4 ng/mL (ref 0.0–4.0)

## 2016-04-24 LAB — HIV ANTIBODY (ROUTINE TESTING W REFLEX): HIV SCREEN 4TH GENERATION: NONREACTIVE

## 2016-05-01 ENCOUNTER — Ambulatory Visit: Payer: Medicare Other | Admitting: Family Medicine

## 2016-05-09 ENCOUNTER — Ambulatory Visit (INDEPENDENT_AMBULATORY_CARE_PROVIDER_SITE_OTHER): Payer: 59 | Admitting: Psychiatry

## 2016-05-09 ENCOUNTER — Encounter: Payer: Self-pay | Admitting: Psychiatry

## 2016-05-09 VITALS — BP 210/110 | HR 102 | Temp 97.9°F | Ht 67.0 in | Wt 217.6 lb

## 2016-05-09 DIAGNOSIS — F410 Panic disorder [episodic paroxysmal anxiety] without agoraphobia: Secondary | ICD-10-CM

## 2016-05-09 DIAGNOSIS — F339 Major depressive disorder, recurrent, unspecified: Secondary | ICD-10-CM | POA: Diagnosis not present

## 2016-05-09 MED ORDER — SERTRALINE HCL 100 MG PO TABS: 100.0000 mg | ORAL_TABLET | Freq: Every day | ORAL | Status: DC

## 2016-05-09 MED ORDER — ALPRAZOLAM 0.5 MG PO TABS: 0.5000 mg | ORAL_TABLET | Freq: Two times a day (BID) | ORAL | Status: DC | PRN

## 2016-05-09 MED ORDER — ARIPIPRAZOLE 5 MG PO TABS: 5.0000 mg | ORAL_TABLET | Freq: Every day | ORAL | Status: DC

## 2016-05-09 NOTE — Progress Notes (Signed)
BH MD/PA/NP OP Progress Note  05/09/2016 1:55 PM Derek S Ow Jr.  MRN:  [id number]  Subjective:  Patient is a 50 year old male who presented for follow-up appointment accompanied by his sister he has history of depression and panic attacks.He presents with his sister Derek Cooley. Patient reported that he was previously following Dr. Jimmye Norman and has not seen him since January. Patient reported that he was getting his medications prescribed by his primary care physician. Patient reported that he continues to have more swings anger and anxiety. His sister reported that he has lost temper and was incarcerated in November for a month. 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 He does not remember and has poor impulse control. Patient reported that he wants his medications adjusted at this time. He appeared receptive to his medications changes. His sister reported that he has very good response to Cymbalta and Abilify in the past but his medications were changed due to insurance reasons. Patient is open to discussion and would like to restart the Abilify again as he continues to have mood swings at this time. He reported that he also has poor memory related to Xanax. He currently denied having any suicidal ideations or plans.   His  sister reported that he continues to have poor impulse control related to high doses of Zoloft as well.  Chief Complaint:  Chief Complaint    Follow-up; Medication Refill     Visit Diagnosis:     ICD-9-CM ICD-10-CM   1. Major depression, recurrent, chronic (HCC) 296.30 F33.9   2. Panic disorder 300.01 F41.0     Past Medical History:  Past Medical History  Diagnosis Date  . MRSA (methicillin resistant Staphylococcus aureus)   . Emphysema of lung ([place])     right sided  . Asthma   . Sleep apnea     sleep study 2013  . Hypertension   . Diabetes mellitus without complication ([place])   . GERD (gastroesophageal reflux disease)    . Anxiety   . Depression   . Panic disorder   . Acute pancreatitis   . Abdominal abscess ([place])   . Perforated bowel ([place]) 2007    Tempoary Colostomy Bag, Skin Graft for Abd wound  . Back pain   . Obesity   . Subarachnoid hemorrhage ([place]) 2011    coil placed  . Mild cognitive impairment   . 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 Type 2 diabetes mellitus without complication, without long-term current use of insulin ([place]) 04/17/2016  . Vitreous hemorrhage ([place]) 03/27/2011    Overview:  Bilateral; 01/2010, from [facility]     Past Surgical History  Procedure Laterality Date  . Colon surgery  2007    colostomy bag placed s/p perforated bowel  . Perforated bowel    . Knee surgery Right    Family History:  Family History  Problem Relation Age of Onset  . Arthritis Mother   . Asthma Mother   . Diabetes Mother   . Heart disease Mother   . Hyperlipidemia Mother   . Hypertension Mother   . Kidney disease Mother   . Thyroid disease Mother   . Lung disease Mother   . Anxiety disorder Mother   . Depression Mother   . Diabetes Father   . Heart disease Father   . Depression Father   . Anxiety disorder Father   . Arthritis Sister   . Asthma Sister   .  Hyperlipidemia Sister   . Hypertension Sister   . Lung disease Sister   . Anxiety disorder Sister   . Depression Sister   . Hyperlipidemia Brother   . Hypertension Brother   . Diabetes Sister   . Heart disease Sister   . Depression Sister   . Anxiety disorder Sister   . Anxiety disorder Brother   . Depression Brother   . Heart disease Brother    Social History:  Social History   Social History  . Marital Status: Divorced    Spouse Name: N/A  . Number of Children: N/A  . Years of Education: N/A   Social History Main Topics  . Smoking status: Current Every Day Smoker -- 1.50 packs/day    Types: Cigarettes    Start date: 10/11/1984  . Smokeless tobacco: Never Used  . Alcohol Use: 0.0 oz/week    0  Standard drinks or equivalent per week     Comment: Rarely  . Drug Use: No  . Sexual Activity: Not Currently   Other Topics Concern  . None   Social History Narrative   Additional History:   Assessment:   Musculoskeletal: Strength & Muscle Tone: within normal limits Gait & Station: normal Patient leans: N/A  Psychiatric Specialty Exam: HPI  Review of Systems  Constitutional: Positive for malaise/fatigue.  Musculoskeletal: Positive for myalgias and joint pain.  Psychiatric/Behavioral: Positive for depression and memory loss. Negative for suicidal ideas, hallucinations and substance abuse. The patient is nervous/anxious.   All other systems reviewed and are negative.   Blood pressure 210/110, pulse 102, temperature 97.9 F (36.6 C), temperature source Tympanic, height 5\' 7"  (1.702 m), weight 217 lb 9.6 oz (98.703 kg), SpO2 91 %.Body mass index is 34.07 kg/(m^2).  General Appearance: Neat and Well Groomed  Eye Contact:  Good  Speech:  Normal Rate  Volume:  Normal  Mood:  A little better  Affect:  Constricted  Thought Process:  Linear and Logical  Orientation:  Full (Time, Place, and Person)  Thought Content:  Negative  Suicidal Thoughts:  No  Homicidal Thoughts:  No  Memory:  Immediate;   Good Recent;   Good Remote;   Good  Judgement:  Good  Insight:  Good  Psychomotor Activity:  Negative  Concentration:  Good  Recall:  Good  Fund of Knowledge: Good  Language: Good  Akathisia:  Negative  Handed:    AIMS (if indicated):    Assets:  Communication Skills Desire for Improvement Social Support  ADL's:  Intact  Cognition: WNL  Sleep:  poor   Is the patient at risk to self?  No. Has the patient been a risk to self in the past 6 months?  No. Has the patient been a risk to self within the distant past?  No. Is the patient a risk to others?  No. Has the patient been a risk to others in the past 6 months?  No. Has the patient been a risk to others within the distant  past?  No.  Current Medications: Current Outpatient Prescriptions  Medication Sig Dispense Refill  . albuterol (PROVENTIL HFA;VENTOLIN HFA) 108 (90 Base) MCG/ACT inhaler Inhale 1-2 puffs into the lungs every 6 (six) hours as needed for wheezing or shortness of breath. 18 g 3  . ALPRAZolam (XANAX) 1 MG tablet Take 1 tablet (1 mg total) by mouth 2 (two) times daily as needed for anxiety. 60 tablet 4  . aspirin EC 81 MG tablet Take 81 mg by mouth daily.    Marland Kitchen  atorvastatin (LIPITOR) 10 MG tablet Take 1 tablet (10 mg total) by mouth at bedtime. 90 tablet 1  . fluticasone (FLONASE) 50 MCG/ACT nasal spray Place 2 sprays into both nostrils daily. 16 g 12  . Fluticasone-Salmeterol (ADVAIR DISKUS) 250-50 MCG/DOSE AEPB Frequency:BID   Dosage:0.0     Instructions:  Note:Dose: 1 PUFF 60 each 6  . lisinopril (PRINIVIL,ZESTRIL) 30 MG tablet Take 1 tablet (30 mg total) by mouth daily. 30 tablet 3  . omeprazole (PRILOSEC) 40 MG capsule Take 1 capsule (40 mg total) by mouth daily. 90 capsule 1  . sertraline (ZOLOFT) 100 MG tablet Take 2 tablets (200 mg total) by mouth daily. 60 tablet 0   No current facility-administered medications for this visit.    Medical Decision Making:  Established Problem, Stable/Improving (1), Review of Medication Regimen & Side Effects (2) and Review of New Medication or Change in Dosage (2)  Treatment Plan Summary:Medication management   Discussed with patient about the medications treatment risk benefits and alternatives I will decrease sertraline 100 mg in the morning   I will start him on Abilify 5 mg daily as a mood stabilizer Decrease Xanax 0.5 mg by mouth twice a day as he continues to have anxiety and memory issues related to the benzodiazepines and he agreed with the plan  Follow-up in one month or earlier depending on his symptoms   More than 50% of the time spent in psychoeducation, counseling and coordination of care.    This note was generated in part or whole  with voice recognition software. Voice regonition is usually quite accurate but there are transcription errors that can and very often do occur. I apologize for any typographical errors that were not detected and corrected.   Rainey Pines, MD  05/09/2016, 1:55 PM

## 2016-05-29 ENCOUNTER — Ambulatory Visit (INDEPENDENT_AMBULATORY_CARE_PROVIDER_SITE_OTHER): Payer: 59 | Admitting: Psychiatry

## 2016-05-29 ENCOUNTER — Encounter: Payer: Self-pay | Admitting: Psychiatry

## 2016-05-29 VITALS — BP 150/90 | HR 91 | Temp 97.2°F | Ht 67.0 in | Wt 225.2 lb

## 2016-05-29 DIAGNOSIS — F410 Panic disorder [episodic paroxysmal anxiety] without agoraphobia: Secondary | ICD-10-CM

## 2016-05-29 DIAGNOSIS — F339 Major depressive disorder, recurrent, unspecified: Secondary | ICD-10-CM | POA: Diagnosis not present

## 2016-05-29 MED ORDER — ARIPIPRAZOLE 10 MG PO TABS: 10.0000 mg | ORAL_TABLET | Freq: Every day | ORAL | Status: DC

## 2016-05-29 MED ORDER — ALPRAZOLAM 0.5 MG PO TABS: 0.5000 mg | ORAL_TABLET | Freq: Two times a day (BID) | ORAL | Status: DC | PRN

## 2016-05-29 MED ORDER — SERTRALINE HCL 50 MG PO TABS: 50.0000 mg | ORAL_TABLET | Freq: Every day | ORAL | Status: DC

## 2016-05-29 MED ORDER — DULOXETINE HCL 30 MG PO CPEP: 30.0000 mg | ORAL_CAPSULE | Freq: Every day | ORAL | Status: DC

## 2016-05-29 NOTE — Progress Notes (Signed)
BH MD/PA/NP OP Progress Note  05/29/2016 8:39 AM Derek Cooley.  MRN:  [id number]  Subjective:  Patient is a 50 year old male who presented for follow-up appointment accompanied by his sister Derek Cooley. Patient reported that he was previously following Dr. Jimmye Cooley and has not seen him since January. Patient reported that he continues to have headaches and abdominal pain. Patient reported that he has history of brain aneurysm in the past and he gets headache whenever he is doing certainly no worse. Patient also mentioned that he has been having abdominal pain intermittently. His sister reported that he has been compliant with his medication as she helps him with the same. Patient currently denied having any suicidal ideations or plans. He continues to have more swings anger and the sister is interested in having his medications adjusted at this time. Patient reported that he has never taken Cymbalta and we discussed about Cymbalta at previous appointment.  He is improving on Abilify at this time. His memory continues to be poor related to the brain aneurysm in the past. He sleeps fine and has been taking Xanax twice daily to help with his anxiety symptoms.  His  sister reported that he continues to have poor impulse control related to high doses of Zoloft as well.  Chief Complaint:  Chief Complaint    Follow-up; Medication Refill     Visit Diagnosis:     ICD-9-CM ICD-10-CM   1. Major depression, recurrent, chronic (HCC) 296.30 F33.9   2. Panic disorder 300.01 F41.0     Past Medical History:  Past Medical History  Diagnosis Date  . MRSA (methicillin resistant Staphylococcus aureus)   . Emphysema of lung ([place])     right sided  . Asthma   . Sleep apnea     sleep study 2013  . Hypertension   . Diabetes mellitus without complication ([place])   . GERD (gastroesophageal reflux disease)   . Anxiety   . Depression   . Panic disorder   . Acute pancreatitis   . Abdominal abscess ([place])   .  Perforated bowel ([place]) 2007    Tempoary Colostomy Bag, Skin Graft for Abd wound  . Back pain   . Obesity   . Subarachnoid hemorrhage ([place]) 2011    coil placed  . Mild cognitive impairment   . 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 Type 2 diabetes mellitus without complication, without long-term current use of insulin ([place]) 04/17/2016  . Vitreous hemorrhage ([place]) 03/27/2011    Overview:  Bilateral; 01/2010, from [facility]     Past Surgical History  Procedure Laterality Date  . Colon surgery  2007    colostomy bag placed s/p perforated bowel  . Perforated bowel    . Knee surgery Right    Family History:  Family History  Problem Relation Age of Onset  . Arthritis Mother   . Asthma Mother   . Diabetes Mother   . Heart disease Mother   . Hyperlipidemia Mother   . Hypertension Mother   . Kidney disease Mother   . Thyroid disease Mother   . Lung disease Mother   . Anxiety disorder Mother   . Depression Mother   . Diabetes Father   . Heart disease Father   . Depression Father   . Anxiety disorder Father   . Arthritis Sister   . Asthma Sister   . Hyperlipidemia Sister   . Hypertension Sister   . Lung disease Sister   . Anxiety disorder  Sister   . Depression Sister   . Hyperlipidemia Brother   . Hypertension Brother   . Diabetes Sister   . Heart disease Sister   . Depression Sister   . Anxiety disorder Sister   . Anxiety disorder Brother   . Depression Brother   . Heart disease Brother    Social History:  Social History   Social History  . Marital Status: Divorced    Spouse Name: N/A  . Number of Children: N/A  . Years of Education: N/A   Social History Main Topics  . Smoking status: Current Every Day Smoker -- 1.50 packs/day    Types: Cigarettes    Start date: 10/11/1984  . Smokeless tobacco: Never Used  . Alcohol Use: 0.0 oz/week    0 Standard drinks or equivalent per week     Comment: Rarely  . Drug Use: No  . Sexual Activity: Not  Currently   Other Topics Concern  . None   Social History Narrative   Additional History:   Assessment:   Musculoskeletal: Strength & Muscle Tone: within normal limits Gait & Station: normal Patient leans: N/A  Psychiatric Specialty Exam: HPI  Review of Systems  Constitutional: Positive for malaise/fatigue.  Gastrointestinal: Positive for abdominal pain and diarrhea.  Musculoskeletal: Positive for myalgias and joint pain.  Neurological: Positive for headaches.  Psychiatric/Behavioral: Positive for depression. Negative for suicidal ideas, hallucinations, memory loss and substance abuse. The patient is nervous/anxious and has insomnia.   All other systems reviewed and are negative.   Blood pressure 150/90, pulse 91, temperature 97.2 F (36.2 C), temperature source Tympanic, height 5\' 7"  (1.702 m), weight 225 lb 3.2 oz (102.15 kg), SpO2 92 %.Body mass index is 35.26 kg/(m^2).  General Appearance: Neat and Well Groomed  Eye Contact:  Good  Speech:  Normal Rate  Volume:  Normal  Mood:  A little better  Affect:  Constricted  Thought Process:  Linear and Logical  Orientation:  Full (Time, Place, and Person)  Thought Content:  Negative  Suicidal Thoughts:  No  Homicidal Thoughts:  No  Memory:  Immediate;   Good Recent;   Good Remote;   Good  Judgement:  Good  Insight:  Good  Psychomotor Activity:  Negative  Concentration:  Good  Recall:  Good  Fund of Knowledge: Good  Language: Good  Akathisia:  Negative  Handed:    AIMS (if indicated):    Assets:  Communication Skills Desire for Improvement Social Support  ADL's:  Intact  Cognition: WNL  Sleep:  poor   Is the patient at risk to self?  No. Has the patient been a risk to self in the past 6 months?  No. Has the patient been a risk to self within the distant past?  No. Is the patient a risk to others?  No. Has the patient been a risk to others in the past 6 months?  No. Has the patient been a risk to others within  the distant past?  No.  Current Medications: Current Outpatient Prescriptions  Medication Sig Dispense Refill  . albuterol (PROVENTIL HFA;VENTOLIN HFA) 108 (90 Base) MCG/ACT inhaler Inhale 1-2 puffs into the lungs every 6 (six) hours as needed for wheezing or shortness of breath. 18 g 3  . ALPRAZolam (XANAX) 0.5 MG tablet Take 1 tablet (0.5 mg total) by mouth 2 (two) times daily as needed for anxiety. 60 tablet 0  . ARIPiprazole (ABILIFY) 5 MG tablet Take 1 tablet (5 mg total) by mouth daily.  30 tablet 0  . aspirin EC 81 MG tablet Take 81 mg by mouth daily.    Marland Kitchen atorvastatin (LIPITOR) 10 MG tablet Take 1 tablet (10 mg total) by mouth at bedtime. 90 tablet 1  . fluticasone (FLONASE) 50 MCG/ACT nasal spray Place 2 sprays into both nostrils daily. 16 g 12  . Fluticasone-Salmeterol (ADVAIR DISKUS) 250-50 MCG/DOSE AEPB Frequency:BID   Dosage:0.0     Instructions:  Note:Dose: 1 PUFF 60 each 6  . lisinopril (PRINIVIL,ZESTRIL) 30 MG tablet Take 1 tablet (30 mg total) by mouth daily. 30 tablet 3  . omeprazole (PRILOSEC) 40 MG capsule Take 1 capsule (40 mg total) by mouth daily. 90 capsule 1  . sertraline (ZOLOFT) 100 MG tablet Take 1 tablet (100 mg total) by mouth daily. 30 tablet 0   No current facility-administered medications for this visit.    Medical Decision Making:  Established Problem, Stable/Improving (1), Review of Medication Regimen & Side Effects (2) and Review of New Medication or Change in Dosage (2)  Treatment Plan Summary:Medication management   Discussed with patient about the medications treatment risk benefits and alternatives I will decrease Sertraline 50 mg daily for 15 days and then discontinue I will start him on Abilify 10  mg daily as a mood stabilizer Decrease Xanax 0.5 mg by mouth twice a day as he continues to have anxiety and memory issues related to the benzodiazepines and he agreed with the plan I will start him on Cymbalta 30 mg daily and he agreed with the plan.  Discussed the side effects in detail.  Follow-up in 3 weeks  or earlier depending on his symptoms   More than 50% of the time spent in psychoeducation, counseling and coordination of care.    This note was generated in part or whole with voice recognition software. Voice regonition is usually quite accurate but there are transcription errors that can and very often do occur. I apologize for any typographical errors that were not detected and corrected.   Rainey Pines, MD  05/29/2016, 8:39 AM

## 2016-06-04 ENCOUNTER — Ambulatory Visit: Payer: Medicare Other | Admitting: Psychiatry

## 2016-06-04 DIAGNOSIS — R030 Elevated blood-pressure reading, without diagnosis of hypertension: Secondary | ICD-10-CM | POA: Diagnosis not present

## 2016-06-04 DIAGNOSIS — S81801A Unspecified open wound, right lower leg, initial encounter: Secondary | ICD-10-CM | POA: Diagnosis not present

## 2016-06-05 ENCOUNTER — Ambulatory Visit (INDEPENDENT_AMBULATORY_CARE_PROVIDER_SITE_OTHER): Payer: Medicare Other | Admitting: Family Medicine

## 2016-06-05 ENCOUNTER — Encounter: Payer: Self-pay | Admitting: Family Medicine

## 2016-06-05 ENCOUNTER — Ambulatory Visit: Payer: Medicare Other | Admitting: Family Medicine

## 2016-06-05 VITALS — BP 158/99 | HR 79 | Temp 98.7°F | Wt 224.0 lb

## 2016-06-05 DIAGNOSIS — I10 Essential (primary) hypertension: Secondary | ICD-10-CM

## 2016-06-05 DIAGNOSIS — Z720 Tobacco use: Secondary | ICD-10-CM | POA: Diagnosis not present

## 2016-06-05 MED ORDER — AMLODIPINE BESYLATE 5 MG PO TABS: 5.0000 mg | ORAL_TABLET | Freq: Every day | ORAL | Status: DC

## 2016-06-05 MED ORDER — NICOTINE 21 MG/24HR TD PT24: 21.0000 mg | MEDICATED_PATCH | Freq: Every day | TRANSDERMAL | Status: DC

## 2016-06-05 NOTE — Patient Instructions (Addendum)
Call with any questions or concerns. Try weaning down number of cigarettes daily with help of nicotine patches. Smoking Cessation, Tips for Success If you are ready to quit smoking, congratulations! You have chosen to help yourself be healthier. Cigarettes bring nicotine, tar, carbon monoxide, and other irritants into your body. Your lungs, heart, and blood vessels will be able to work better without these poisons. There are many different ways to quit smoking. Nicotine gum, nicotine patches, a nicotine inhaler, or nicotine nasal spray can help with physical craving. Hypnosis, support groups, and medicines help break the habit of smoking. WHAT THINGS CAN I DO TO MAKE QUITTING EASIER?  Here are some tips to help you quit for good:  Pick a date when you will quit smoking completely. Tell all of your friends and family about your plan to quit on that date.  Do not try to slowly cut down on the number of cigarettes you are smoking. Pick a quit date and quit smoking completely starting on that day.  Throw away all cigarettes.   Clean and remove all ashtrays from your home, work, and car.  On a card, write down your reasons for quitting. Carry the card with you and read it when you get the urge to smoke.  Cleanse your body of nicotine. Drink enough water and fluids to keep your urine clear or pale yellow. Do this after quitting to flush the nicotine from your body.  Learn to predict your moods. Do not let a bad situation be your excuse to have a cigarette. Some situations in your life might tempt you into wanting a cigarette.  Never have "just one" cigarette. It leads to wanting another and another. Remind yourself of your decision to quit.  Change habits associated with smoking. If you smoked while driving or when feeling stressed, try other activities to replace smoking. Stand up when drinking your coffee. Brush your teeth after eating. Sit in a different chair when you read the paper. Avoid  alcohol while trying to quit, and try to drink fewer caffeinated beverages. Alcohol and caffeine may urge you to smoke.  Avoid foods and drinks that can trigger a desire to smoke, such as sugary or spicy foods and alcohol.  Ask people who smoke not to smoke around you.  Have something planned to do right after eating or having a cup of coffee. For example, plan to take a walk or exercise.  Try a relaxation exercise to calm you down and decrease your stress. Remember, you may be tense and nervous for the first 2 weeks after you quit, but this will pass.  Find new activities to keep your hands busy. Play with a pen, coin, or rubber band. Doodle or draw things on paper.  Brush your teeth right after eating. This will help cut down on the craving for the taste of tobacco after meals. You can also try mouthwash.   Use oral substitutes in place of cigarettes. Try using lemon drops, carrots, cinnamon sticks, or chewing gum. Keep them handy so they are available when you have the urge to smoke.  When you have the urge to smoke, try deep breathing.  Designate your home as a nonsmoking area.  If you are a heavy smoker, ask your health care provider about a prescription for nicotine chewing gum. It can ease your withdrawal from nicotine.  Reward yourself. Set aside the cigarette money you save and buy yourself something nice.  Look for support from others. Join a support  group or smoking cessation program. Ask someone at home or at work to help you with your plan to quit smoking.  Always ask yourself, "Do I need this cigarette or is this just a reflex?" Tell yourself, "Today, I choose not to smoke," or "I do not want to smoke." You are reminding yourself of your decision to quit.  Do not replace cigarette smoking with electronic cigarettes (commonly called e-cigarettes). The safety of e-cigarettes is unknown, and some may contain harmful chemicals.  If you relapse, do not give up! Plan ahead and  think about what you will do the next time you get the urge to smoke. HOW WILL I FEEL WHEN I QUIT SMOKING? You may have symptoms of withdrawal because your body is used to nicotine (the addictive substance in cigarettes). You may crave cigarettes, be irritable, feel very hungry, cough often, get headaches, or have difficulty concentrating. The withdrawal symptoms are only temporary. They are strongest when you first quit but will go away within 10-14 days. When withdrawal symptoms occur, stay in control. Think about your reasons for quitting. Remind yourself that these are signs that your body is healing and getting used to being without cigarettes. Remember that withdrawal symptoms are easier to treat than the major diseases that smoking can cause.  Even after the withdrawal is over, expect periodic urges to smoke. However, these cravings are generally short lived and will go away whether you smoke or not. Do not smoke! WHAT RESOURCES ARE AVAILABLE TO HELP ME QUIT SMOKING? Your health care provider can direct you to community resources or hospitals for support, which may include:  Group support.  Education.  Hypnosis.  Therapy.   This information is not intended to replace advice given to you by your health care provider. Make sure you discuss any questions you have with your health care provider.   Document Released: 08/24/2004 Document Revised: 12/17/2014 Document Reviewed: 05/14/2013 Elsevier Interactive Patient Education [employer].

## 2016-06-05 NOTE — Progress Notes (Signed)
BP 158/99 mmHg  Pulse 79  Temp(Src) 98.7 F (37.1 C)  Wt 224 lb (101.606 kg)  SpO2 96%   Subjective:    Patient ID: Derek Binet., male    DOB: 13-Sep-1966, 50 y.o.   MRN: [id number]  HPI: Derek Wingerter. is a 50 y.o. male  Chief Complaint  Patient presents with  . Hypertension    forgot to take his BP med yesterday, then fell and dropped a box and cut his leg. Got stiches at urgent care.  He did take his BP med this morning.   Patient presents today for f/u HTN. Started on lisinopril about 6 weeks ago, no side effects reported. Taking medication faithfully. Patient has not been checking BPs at home, but was 168/105 per notes from [facility] UC yesterday when he went for stitches to his lower leg (dropped a wooden box onto leg). No dizzy spells or syncopal episodes.   Smoking cessation - Has tried chantix in the past with poor results. Knows he needs to quit but not sure he is ready with how much is going on in his life right now. Thinks he could keep up with a patch.   Relevant past medical, surgical, family and social history reviewed and updated as indicated. Interim medical history since our last visit reviewed. Allergies and medications reviewed and updated.  Review of Systems  Constitutional: Negative.   Eyes: Negative.   Respiratory: Negative.   Cardiovascular: Negative.   Musculoskeletal: Negative.   Skin: Positive for wound (right lower leg wound, sutured yesterday at [facility]).  Neurological: Negative.   Psychiatric/Behavioral: Negative.     Per HPI unless specifically indicated above     Objective:    BP 158/99 mmHg  Pulse 79  Temp(Src) 98.7 F (37.1 C)  Wt 224 lb (101.606 kg)  SpO2 96%  Wt Readings from Last 3 Encounters:  06/05/16 224 lb (101.606 kg)  05/29/16 225 lb 3.2 oz (102.15 kg)  05/09/16 217 lb 9.6 oz (98.703 kg)    Physical Exam  Constitutional: He appears well-developed and well-nourished.  Eyes: Conjunctivae are normal. No scleral  icterus.  Neck: Neck supple.  Cardiovascular: Normal rate, regular rhythm and normal heart sounds.   Pulmonary/Chest: Effort normal.  Musculoskeletal: Normal range of motion.  Neurological: He is alert.  Skin: Skin is warm and dry.  Psychiatric: He has a normal mood and affect. His behavior is normal.    Results for orders placed or performed in visit on 04/17/16  HIV antibody  Result Value Ref Range   HIV Screen 4th Generation wRfx Non Reactive Non Reactive  Comprehensive metabolic panel  Result Value Ref Range   Glucose 105 (H) 65 - 99 mg/dL   BUN 13 6 - 24 mg/dL   Creatinine, Ser 1.27 0.76 - 1.27 mg/dL   GFR calc non Af Amer 65 >59 mL/min/1.73   GFR calc Af Amer 76 >59 mL/min/1.73   BUN/Creatinine Ratio 10 9 - 20   Sodium 142 134 - 144 mmol/L   Potassium 4.8 3.5 - 5.2 mmol/L   Chloride 101 96 - 106 mmol/L   CO2 22 18 - 29 mmol/L   Calcium 9.5 8.7 - 10.2 mg/dL   Total Protein 6.5 6.0 - 8.5 g/dL   Albumin 4.2 3.5 - 5.5 g/dL   Globulin, Total 2.3 1.5 - 4.5 g/dL   Albumin/Globulin Ratio 1.8 1.2 - 2.2   Bilirubin Total 0.2 0.0 - 1.2 mg/dL   Alkaline Phosphatase 111 39 -  117 IU/L   AST 15 0 - 40 IU/L   ALT 10 0 - 44 IU/L  Bayer DCA Hb A1c Waived  Result Value Ref Range   Bayer DCA Hb A1c Waived 5.6 <7.0 %  Lipid Panel w/o Chol/HDL Ratio  Result Value Ref Range   Cholesterol, Total 183 100 - 199 mg/dL   Triglycerides 551 (HH) 0 - 149 mg/dL   HDL 29 (L) >39 mg/dL   VLDL Cholesterol Cal Comment 5 - 40 mg/dL   LDL Calculated Comment 0 - 99 mg/dL  PSA  Result Value Ref Range   Prostate Specific Ag, Serum 0.4 0.0 - 4.0 ng/mL  TSH  Result Value Ref Range   TSH 1.710 0.450 - 4.500 uIU/mL  UA/M w/rflx Culture, Routine  Result Value Ref Range   Specific Gravity, UA 1.010 1.005 - 1.030   pH, UA 6.5 5.0 - 7.5   Color, UA Yellow Yellow   Appearance Ur Clear Clear   Leukocytes, UA Negative Negative   Protein, UA Negative Negative/Trace   Glucose, UA Negative Negative    Ketones, UA Negative Negative   RBC, UA Negative Negative   Bilirubin, UA Negative Negative   Urobilinogen, Ur 0.2 0.2 - 1.0 mg/dL   Nitrite, UA Negative Negative  Microalbumin, Urine Waived  Result Value Ref Range   Microalb, Ur Waived 30 (H) 0 - 19 mg/L   Creatinine, Urine Waived 200 10 - 300 mg/dL   Microalb/Creat Ratio <30 <30 mg/g  CBC With Differential/Platelet  Result Value Ref Range   WBC 7.8 3.4 - 10.8 x10E3/uL   RBC 4.76 4.14 - 5.80 x10E6/uL   Hemoglobin 15.3 12.6 - 17.7 g/dL   Hematocrit 44.0 37.5 - 51.0 %   MCV 92 79 - 97 fL   MCH 32.1 26.6 - 33.0 pg   MCHC 34.8 31.5 - 35.7 g/dL   RDW 13.4 12.3 - 15.4 %   Platelets 242 150 - 379 x10E3/uL   Neutrophils 60 %   Lymphs 30 %   MID 10 %   Neutrophils Absolute 4.7 1.4 - 7.0 x10E3/uL   Lymphocytes Absolute 2.3 0.7 - 3.1 x10E3/uL   MID (Absolute) 0.8 0.1 - 1.6 X10E3/uL  IFOBT POC (occult bld, rslt in office)  Result Value Ref Range   IFOBT Negative       Assessment & Plan:   Problem List Items Addressed This Visit      Other   Tobacco abuse   Relevant Medications   nicotine (NICODERM CQ - DOSED IN MG/24 HOURS) 21 mg/24hr patch    Other Visit Diagnoses    Essential hypertension    -  Primary    Continue lisinopril, add 5 mg amlodipine. Counseled pt on side effects, requested BPs to be checked several times a week. F/u in one month. Cont lifestyle mod.    Relevant Medications    amLODipine (NORVASC) 5 MG tablet        Follow up plan: Return in about 4 weeks (around 07/03/2016) for BP check.

## 2016-06-15 ENCOUNTER — Telehealth: Payer: Self-pay | Admitting: Family Medicine

## 2016-06-15 MED ORDER — CLINDAMYCIN HCL 300 MG PO CAPS: 300.0000 mg | ORAL_CAPSULE | Freq: Three times a day (TID) | ORAL | Status: DC

## 2016-06-15 NOTE — Telephone Encounter (Signed)
Pt requests call back from Dr. Wynetta Emery, phone went out unable to get more information. Thanks.

## 2016-06-15 NOTE — Telephone Encounter (Signed)
His sister thinks he's getting the infection back in his stomach. Starting with knots again. They have not heard from ID at [facility]. Will treat with clindamycin for a week. Sent to his pharmacy. Will check in with ID referral. Call with any concerns.

## 2016-06-15 NOTE — Telephone Encounter (Signed)
Routing to Dr. Johnson

## 2016-06-18 ENCOUNTER — Ambulatory Visit (INDEPENDENT_AMBULATORY_CARE_PROVIDER_SITE_OTHER): Payer: 59 | Admitting: Psychiatry

## 2016-07-03 ENCOUNTER — Other Ambulatory Visit: Payer: Self-pay

## 2016-07-03 ENCOUNTER — Ambulatory Visit: Payer: Medicare Other | Admitting: Family Medicine

## 2016-07-12 ENCOUNTER — Other Ambulatory Visit: Payer: Self-pay | Admitting: Psychiatry

## 2016-07-13 ENCOUNTER — Telehealth: Payer: Self-pay

## 2016-07-13 MED ORDER — CLINDAMYCIN HCL 300 MG PO CAPS: 300.0000 mg | ORAL_CAPSULE | Freq: Three times a day (TID) | ORAL | 0 refills | Status: DC

## 2016-07-13 NOTE — Telephone Encounter (Signed)
Patients sister called, they still have not gotten a call from infectious disease. She would like to know if patient can have another prescription for the doxycycline for his stomach

## 2016-07-13 NOTE — Telephone Encounter (Signed)
He had clindamycin last time. I've sent him though a refill. He will need to call ID to find out what's going on.

## 2016-07-13 NOTE — Telephone Encounter (Signed)
Patients sister notified. Infectious disease number given to sister to call and schedule appointment.

## 2016-07-16 ENCOUNTER — Other Ambulatory Visit: Payer: Self-pay | Admitting: Psychiatry

## 2016-07-16 ENCOUNTER — Other Ambulatory Visit: Payer: Self-pay | Admitting: Family Medicine

## 2016-07-16 NOTE — Telephone Encounter (Signed)
Will give him 2 weeks worth but he missed his recent follow up and really needs to be seen. Have him schedule an appt ASAP

## 2016-07-16 NOTE — Telephone Encounter (Signed)
Routing to provider  

## 2016-07-17 NOTE — Telephone Encounter (Signed)
Left message to call.

## 2016-07-18 DIAGNOSIS — Z9889 Other specified postprocedural states: Secondary | ICD-10-CM | POA: Diagnosis not present

## 2016-07-18 DIAGNOSIS — L02211 Cutaneous abscess of abdominal wall: Secondary | ICD-10-CM | POA: Diagnosis not present

## 2016-07-18 DIAGNOSIS — G4733 Obstructive sleep apnea (adult) (pediatric): Secondary | ICD-10-CM | POA: Diagnosis not present

## 2016-07-18 DIAGNOSIS — M1711 Unilateral primary osteoarthritis, right knee: Secondary | ICD-10-CM | POA: Diagnosis not present

## 2016-07-18 DIAGNOSIS — I10 Essential (primary) hypertension: Secondary | ICD-10-CM | POA: Diagnosis not present

## 2016-07-18 DIAGNOSIS — R109 Unspecified abdominal pain: Secondary | ICD-10-CM | POA: Diagnosis not present

## 2016-07-18 DIAGNOSIS — G8929 Other chronic pain: Secondary | ICD-10-CM | POA: Diagnosis not present

## 2016-07-18 DIAGNOSIS — T8579XA Infection and inflammatory reaction due to other internal prosthetic devices, implants and grafts, initial encounter: Secondary | ICD-10-CM | POA: Diagnosis not present

## 2016-08-01 DIAGNOSIS — E119 Type 2 diabetes mellitus without complications: Secondary | ICD-10-CM | POA: Diagnosis not present

## 2016-08-01 DIAGNOSIS — K30 Functional dyspepsia: Secondary | ICD-10-CM | POA: Diagnosis not present

## 2016-08-01 DIAGNOSIS — M549 Dorsalgia, unspecified: Secondary | ICD-10-CM | POA: Diagnosis not present

## 2016-08-01 DIAGNOSIS — R109 Unspecified abdominal pain: Secondary | ICD-10-CM | POA: Diagnosis not present

## 2016-08-01 DIAGNOSIS — M5414 Radiculopathy, thoracic region: Secondary | ICD-10-CM | POA: Diagnosis not present

## 2016-08-01 DIAGNOSIS — M25561 Pain in right knee: Secondary | ICD-10-CM | POA: Diagnosis not present

## 2016-08-01 DIAGNOSIS — G8929 Other chronic pain: Secondary | ICD-10-CM | POA: Diagnosis not present

## 2016-08-01 NOTE — Telephone Encounter (Signed)
Patient's sister notified to have him schedule an appointment.

## 2016-08-21 ENCOUNTER — Other Ambulatory Visit: Payer: Self-pay | Admitting: Psychiatry

## 2016-08-27 ENCOUNTER — Ambulatory Visit (INDEPENDENT_AMBULATORY_CARE_PROVIDER_SITE_OTHER): Payer: Medicare Other | Admitting: Family Medicine

## 2016-08-27 ENCOUNTER — Encounter: Payer: Self-pay | Admitting: Family Medicine

## 2016-08-27 VITALS — BP 159/106 | HR 73 | Temp 98.9°F | Wt 232.0 lb

## 2016-08-27 DIAGNOSIS — J069 Acute upper respiratory infection, unspecified: Secondary | ICD-10-CM

## 2016-08-27 DIAGNOSIS — I129 Hypertensive chronic kidney disease with stage 1 through stage 4 chronic kidney disease, or unspecified chronic kidney disease: Secondary | ICD-10-CM | POA: Diagnosis not present

## 2016-08-27 DIAGNOSIS — R51 Headache: Secondary | ICD-10-CM

## 2016-08-27 DIAGNOSIS — L02211 Cutaneous abscess of abdominal wall: Secondary | ICD-10-CM | POA: Diagnosis not present

## 2016-08-27 DIAGNOSIS — R519 Headache, unspecified: Secondary | ICD-10-CM

## 2016-08-27 MED ORDER — AMLODIPINE BESYLATE 10 MG PO TABS: 10.0000 mg | ORAL_TABLET | Freq: Every day | ORAL | 3 refills | Status: DC

## 2016-08-27 MED ORDER — CLINDAMYCIN HCL 300 MG PO CAPS: 300.0000 mg | ORAL_CAPSULE | Freq: Three times a day (TID) | ORAL | 0 refills | Status: DC

## 2016-08-27 MED ORDER — METOPROLOL SUCCINATE ER 25 MG PO TB24: 25.0000 mg | ORAL_TABLET | Freq: Every day | ORAL | 1 refills | Status: DC

## 2016-08-27 NOTE — Patient Instructions (Addendum)
Follow up as scheduled.  For BP - take 30 mg lisinopril, 10 mg amlodipine, and 25 mg metoprolol every day

## 2016-08-27 NOTE — Assessment & Plan Note (Signed)
Not at goal, add metoprolol and increase amlodipine to 10 mg.

## 2016-08-27 NOTE — Assessment & Plan Note (Signed)
Clindamycin sent, referral to ID placed

## 2016-08-27 NOTE — Progress Notes (Signed)
BP (!) 159/106   Pulse 73   Temp 98.9 F (37.2 C)   Wt 232 lb (105.2 kg)   SpO2 98%   BMI 36.34 kg/m    Subjective:    Patient ID: Derek Cooley., male    DOB: [birth year], 50 y.o.   MRN: [id number]  HPI: Derek Cooley. is a 50 y.o. male  Chief Complaint  Patient presents with  . Hypertension    BP been running higher.  . Referral    he needs a referral to [facility] ID for the abdominal wall abcess.  . Medication Refill    he needs a refill on the Clindamycin to cover him until he can get back in to see ID  . Sinus Problem    x 2-3 weeks, head congestion, runny nose, sneezing, cough, no fever, no sore throat   Patient presents for BP follow up. Currently on 30 mg lisinopril and 5 mg amlodipine. Taking medicines faithfully without side effects. Has not been checking BP at home.   Also having recurrent issues from an abdominal wall abscess that he has been intermittently on clindamycin for. Needs new ID referral for this issue and a script to get him through until then.   Having congestion, cough, rhinorrhea for several weeks now. Denies fever, chills, aches. Not taking anything OTC for symptoms.   Intractible, severe HA with photophobia for about 3 straight weeks now. Hx of ruptured aneurysm. Last CTA head 2013. Has not seen neuro since 2014.   Relevant past medical, surgical, family and social history reviewed and updated as indicated. Interim medical history since our last visit reviewed. Allergies and medications reviewed and updated.  Review of Systems  Constitutional: Negative.   HENT: Positive for congestion, rhinorrhea and sneezing.   Eyes: Positive for photophobia.  Respiratory: Positive for cough.   Cardiovascular: Negative.   Gastrointestinal: Negative.   Genitourinary: Negative.   Musculoskeletal: Negative.   Skin: Negative.   Neurological: Positive for headaches.  Psychiatric/Behavioral: Negative.     Per HPI unless specifically indicated above    Objective:    BP (!) 159/106   Pulse 73   Temp 98.9 F (37.2 C)   Wt 232 lb (105.2 kg)   SpO2 98%   BMI 36.34 kg/m   Wt Readings from Last 3 Encounters:  08/27/16 232 lb (105.2 kg)  06/05/16 224 lb (101.6 kg)  05/29/16 225 lb 3.2 oz (102.2 kg)    Physical Exam  Constitutional: He is oriented to person, place, and time. He appears well-developed and well-nourished. No distress.  HENT:  Head: Atraumatic.  Eyes: Conjunctivae are normal. Pupils are equal, round, and reactive to light. No scleral icterus.  Neck: Normal range of motion. Neck supple.  Cardiovascular: Normal rate.   Pulmonary/Chest: Effort normal. No respiratory distress.  Musculoskeletal: Normal range of motion.  Neurological: He is alert and oriented to person, place, and time. No cranial nerve deficit. He exhibits normal muscle tone.  Skin: Skin is warm and dry.  Psychiatric: He has a normal mood and affect. His behavior is normal.                         Assessment & Plan:   Problem List Items Addressed This Visit      Genitourinary   Benign hypertensive renal disease - Primary    Not at goal, add metoprolol and increase amlodipine to 10 mg.  Other   Abdominal wall abscess    Clindamycin sent, referral to ID placed      Relevant Orders   Ambulatory referral to Infectious Disease    Other Visit Diagnoses    Intractable headache, unspecified chronicity pattern, unspecified headache type       Last imaging in 2014, given hx of ruptured aneurysm and uncontrolled HTN, will place stat referral to neurology for eval and imaging as needed   Relevant Medications   DULoxetine (CYMBALTA) 60 MG capsule   tiZANidine (ZANAFLEX) 2 MG tablet   amLODipine (NORVASC) 10 MG tablet   metoprolol succinate (TOPROL-XL) 25 MG 24 hr tablet   Other Relevant Orders   Ambulatory referral to Neurology   Upper respiratory infection       Should benefit from the clindamycin, if no better after several days he is to  call back   Relevant Medications   clindamycin (CLEOCIN) 300 MG capsule       Follow up plan: Return in about 6 weeks (around 10/08/2016) for Physical Exam, BP follow up.

## 2016-08-28 ENCOUNTER — Other Ambulatory Visit: Payer: Self-pay | Admitting: Family Medicine

## 2016-08-29 DIAGNOSIS — R109 Unspecified abdominal pain: Secondary | ICD-10-CM | POA: Diagnosis not present

## 2016-08-29 DIAGNOSIS — R51 Headache: Secondary | ICD-10-CM | POA: Diagnosis not present

## 2016-08-29 DIAGNOSIS — G8929 Other chronic pain: Secondary | ICD-10-CM | POA: Diagnosis not present

## 2016-08-29 DIAGNOSIS — M62838 Other muscle spasm: Secondary | ICD-10-CM | POA: Diagnosis not present

## 2016-08-29 DIAGNOSIS — M5414 Radiculopathy, thoracic region: Secondary | ICD-10-CM | POA: Diagnosis not present

## 2016-08-29 DIAGNOSIS — K861 Other chronic pancreatitis: Secondary | ICD-10-CM | POA: Diagnosis not present

## 2016-08-29 NOTE — Telephone Encounter (Signed)
rx

## 2016-09-05 ENCOUNTER — Encounter: Payer: Self-pay | Admitting: Psychiatry

## 2016-09-05 ENCOUNTER — Ambulatory Visit (INDEPENDENT_AMBULATORY_CARE_PROVIDER_SITE_OTHER): Payer: 59 | Admitting: Psychiatry

## 2016-09-05 VITALS — BP 145/83 | HR 77 | Temp 97.7°F | Wt 235.0 lb

## 2016-09-05 DIAGNOSIS — F410 Panic disorder [episodic paroxysmal anxiety] without agoraphobia: Secondary | ICD-10-CM | POA: Diagnosis not present

## 2016-09-05 DIAGNOSIS — F339 Major depressive disorder, recurrent, unspecified: Secondary | ICD-10-CM

## 2016-09-05 MED ORDER — ARIPIPRAZOLE 10 MG PO TABS: 10.0000 mg | ORAL_TABLET | Freq: Every day | ORAL | 1 refills | Status: DC

## 2016-09-05 MED ORDER — PROPRANOLOL HCL 10 MG PO TABS: 10.0000 mg | ORAL_TABLET | Freq: Three times a day (TID) | ORAL | 1 refills | Status: DC

## 2016-09-05 MED ORDER — DULOXETINE HCL 60 MG PO CPEP: 60.0000 mg | ORAL_CAPSULE | Freq: Two times a day (BID) | ORAL | 1 refills | Status: DC

## 2016-09-05 NOTE — Progress Notes (Signed)
BH MD/PA/NP OP Progress Note  09/05/2016 9:20 AM Derek Cooley.  MRN:  [id number]  Subjective:  Patient is a 50 year old male who presented for follow-up appointment accompanied by [name]. Patient  last seen in June. His sister reported that she was unable to bring him back for the appointments as she had the neck surgery done. Patient ran out of his Xanax couple of months ago. He reported that he has been having panic attacks and has been isolating himself in the basement. He reported that he has been getting panic attacks on a daily basis. He has increased anxiety, sweating and flushing. He reported that he wants to have his medications adjusted. He reported that the Cymbalta has been helpful. He is not sleeping well at night. He was recently started on metoprolol to help with the headaches due to history of aneurysm.  His sister remains involved in his care at this time.  We discussed about the medications at length. He agreed for medication adjustment.    Patient currently denied having any suicidal homicidal ideations or plans. He denied having any perceptual disturbances.   Chief Complaint:  Chief Complaint    Follow-up; Medication Refill     Visit Diagnosis:     ICD-9-CM ICD-10-CM   1. Major depression, recurrent, chronic (HCC) 296.30 F33.9   2. Panic disorder 300.01 F41.0     Past Medical History:  Past Medical History:  Diagnosis Date  . Abdominal abscess ([place])   . Acute pancreatitis   . Anxiety   . Asthma   . Back pain   . Depression   . Diabetes mellitus without complication ([place])   . Emphysema of lung ([place])    right sided  . GERD (gastroesophageal reflux disease)   . Hemorrhage into subarachnoid space of neuraxis ([place]) 01/26/2010  . Hypertension   . Mild cognitive impairment   . MRSA (methicillin resistant Staphylococcus aureus)   . Obesity   . Panic disorder   . Perforated bowel ([place]) 2007   Tempoary Colostomy Bag, Skin Graft for Abd wound  . Sleep  apnea    sleep study 2013  . Subarachnoid hemorrhage ([place]) 2011   coil placed  . Type 2 diabetes mellitus without complication, without long-term current use of insulin ([place]) 04/17/2016  . Vitreous hemorrhage ([place]) 03/27/2011   Overview:  B[facility]; [facility]/2010, [facility]     Past Surgical History:  Procedure Laterality Date  . COLON SURGERY  2007   colostomy bag placed s/p perforated bowel  . KNEE SURGERY Right   . Perforated bowel     Family History:  Family History  Problem Relation Age of Onset  . Arthritis Mother   . Asthma Mother   . Diabetes Mother   . Heart disease Mother   . Hyperlipidemia Mother   . Hypertension Mother   . Kidney disease Mother   . Thyroid disease Mother   . Lung disease Mother   . Anxiety disorder Mother   . Depression Mother   . Diabetes Father   . Heart disease Father   . Depression Father   . Anxiety disorder Father   . Arthritis Sister   . Asthma Sister   . Hyperlipidemia Sister   . Hypertension Sister   . Lung disease Sister   . Anxiety disorder Sister   . Depression Sister   . Hyperlipidemia Brother   . Hypertension Brother   . Diabetes Sister   . Heart disease Sister   . Depression  Sister   . Anxiety disorder Sister   . Anxiety disorder Brother   . Depression Brother   . Heart disease Brother    Social History:  Social History   Social History  . Marital status: Divorced    Spouse name: N/A  . Number of children: N/A  . Years of education: N/A   Social History Main Topics  . Smoking status: Current Every Day Smoker    Packs/day: 1.50    Types: Cigarettes    Start date: 10/11/1984  . Smokeless tobacco: Never Used  . Alcohol use 0.0 oz/week     Comment: Rarely  . Drug use: No  . Sexual activity: Not Currently   Other Topics Concern  . None   Social History Narrative  . None   Additional History:   Assessment:   Musculoskeletal: Strength & Muscle Tone: within normal limits Gait & Station: normal Patient leans:  N/A  Psychiatric Specialty Exam: Medication Refill  Associated symptoms include abdominal pain, headaches and myalgias.    Review of Systems  Constitutional: Positive for malaise/fatigue.  Gastrointestinal: Positive for abdominal pain and diarrhea.  Musculoskeletal: Positive for joint pain and myalgias.  Neurological: Positive for headaches.  Psychiatric/Behavioral: Positive for depression. Negative for hallucinations, memory loss, substance abuse and suicidal ideas. The patient is nervous/anxious and has insomnia.   All other systems reviewed and are negative.   Blood pressure (!) 145/83, pulse 77, temperature 97.7 F (36.5 C), temperature source Oral, weight 235 lb (106.6 kg).Body mass index is 36.81 kg/m.  General Appearance: Neat and Well Groomed  Eye Contact:  Good  Speech:  Normal Rate  Volume:  Normal  Mood:  A little better  Affect:  Constricted  Thought Process:  Linear and Logical  Orientation:  Full (Time, Place, and Person)  Thought Content:  Negative  Suicidal Thoughts:  No  Homicidal Thoughts:  No  Memory:  Immediate;   Good Recent;   Good Remote;   Good  Judgement:  Good  Insight:  Good  Psychomotor Activity:  Negative  Concentration:  Good  Recall:  Good  Fund of Knowledge: Good  Language: Good  Akathisia:  Negative  Handed:    AIMS (if indicated):    Assets:  Communication Skills Desire for Improvement Social Support  ADL's:  Intact  Cognition: WNL  Sleep:  poor   Is the patient at risk to self?  No. Has the patient been a risk to self in the past 6 months?  No. Has the patient been a risk to self within the distant past?  No. Is the patient a risk to others?  No. Has the patient been a risk to others in the past 6 months?  No. Has the patient been a risk to others within the distant past?  No.  Current Medications: Current Outpatient Prescriptions  Medication Sig Dispense Refill  . albuterol (PROVENTIL HFA;VENTOLIN HFA) 108 (90 Base) MCG/ACT  inhaler Inhale 1-2 puffs into the lungs every 6 (six) hours as needed for wheezing or shortness of breath. 18 g 3  . amLODipine (NORVASC) 10 MG tablet Take 1 tablet (10 mg total) by mouth daily. 90 tablet 3  . ARIPiprazole (ABILIFY) 10 MG tablet Take 1 tablet (10 mg total) by mouth daily. 30 tablet 1  . aspirin EC 81 MG tablet Take 81 mg by mouth daily.    . clindamycin (CLEOCIN) 300 MG capsule Take 1 capsule (300 mg total) by mouth 3 (three) times daily. 30 capsule 0  .  DULoxetine (CYMBALTA) 60 MG capsule Take 1 capsule (60 mg total) by mouth 2 (two) times daily. 60 capsule 1  . fluticasone (FLONASE) 50 MCG/ACT nasal spray Place 2 sprays into both nostrils daily. 16 g 12  . Fluticasone-Salmeterol (ADVAIR DISKUS) 250-50 MCG/DOSE AEPB Frequency:BID   Dosage:0.0     Instructions:  Note:Dose: 1 PUFF 60 each 6  . lisinopril (PRINIVIL,ZESTRIL) 30 MG tablet take 1 tablet by mouth once daily 90 tablet 0  . omeprazole (PRILOSEC) 40 MG capsule Take 1 capsule (40 mg total) by mouth daily. 90 capsule 1  . tiZANidine (ZANAFLEX) 2 MG tablet Take 2 mg by mouth every 6 (six) hours as needed.    . propranolol (INDERAL) 10 MG tablet Take 1 tablet (10 mg total) by mouth 3 (three) times daily. 90 tablet 1   No current facility-administered medications for this visit.     Medical Decision Making:  Established Problem, Stable/Improving (1), Review of Medication Regimen & Side Effects (2) and Review of New Medication or Change in Dosage (2)  Treatment Plan Summary:Medication management   Discussed with patient about the medications treatment risk benefits and alternatives  Continue Abilify 10  mg daily as a mood stabilizer  Increase  Cymbalta 60 twice a day and he agreed with the plan. Discussed the side effects in detail. I will add propanolol 10 mg twice daily and add him to take 1 at additional pills on a when necessary basis. Discussed with him about the side effects in detail and he agreed with the  plan. Follow-up in 3 weeks  or earlier depending on his symptoms   More than 50% of the time spent in psychoeducation, counseling and coordination of care.    This note was generated in part or whole with voice recognition software. Voice regonition is usually quite accurate but there are transcription errors that can and very often do occur. I apologize for any typographical errors that were not detected and corrected.   Rainey Pines, MD  09/05/2016, 9:20 AM

## 2016-10-01 DIAGNOSIS — A4902 Methicillin resistant Staphylococcus aureus infection, unspecified site: Secondary | ICD-10-CM | POA: Diagnosis not present

## 2016-10-01 DIAGNOSIS — N183 Chronic kidney disease, stage 3 (moderate): Secondary | ICD-10-CM | POA: Diagnosis not present

## 2016-10-01 DIAGNOSIS — Z79899 Other long term (current) drug therapy: Secondary | ICD-10-CM | POA: Diagnosis not present

## 2016-10-01 DIAGNOSIS — L02211 Cutaneous abscess of abdominal wall: Secondary | ICD-10-CM | POA: Diagnosis not present

## 2016-10-03 ENCOUNTER — Encounter: Payer: Self-pay | Admitting: Psychiatry

## 2016-10-03 ENCOUNTER — Ambulatory Visit (INDEPENDENT_AMBULATORY_CARE_PROVIDER_SITE_OTHER): Payer: 59 | Admitting: Psychiatry

## 2016-10-03 VITALS — BP 178/113 | HR 83 | Temp 97.8°F | Wt 236.8 lb

## 2016-10-03 DIAGNOSIS — T43505A Adverse effect of unspecified antipsychotics and neuroleptics, initial encounter: Secondary | ICD-10-CM | POA: Diagnosis not present

## 2016-10-03 DIAGNOSIS — F339 Major depressive disorder, recurrent, unspecified: Secondary | ICD-10-CM | POA: Diagnosis not present

## 2016-10-03 DIAGNOSIS — G2571 Drug induced akathisia: Secondary | ICD-10-CM | POA: Diagnosis not present

## 2016-10-03 DIAGNOSIS — F4001 Agoraphobia with panic disorder: Secondary | ICD-10-CM | POA: Diagnosis not present

## 2016-10-03 MED ORDER — MIRTAZAPINE 15 MG PO TABS: 15.0000 mg | ORAL_TABLET | Freq: Every day | ORAL | 0 refills | Status: DC

## 2016-10-03 MED ORDER — CLONAZEPAM 0.5 MG PO TABS: 0.5000 mg | ORAL_TABLET | Freq: Two times a day (BID) | ORAL | 0 refills | Status: DC | PRN

## 2016-10-03 MED ORDER — DULOXETINE HCL 60 MG PO CPEP: 60.0000 mg | ORAL_CAPSULE | Freq: Every day | ORAL | 1 refills | Status: DC

## 2016-10-03 NOTE — Progress Notes (Signed)
BH MD/PA/NP OP Progress Note  10/03/2016 11:26 AM Derek S Dobratz Jr.  MRN:  [id number]  Subjective:  Patient is a 50 year old male who presented for follow-up appointment accompanied by [name]. Patient reported that he has been feeling restless and is unable to sit still. He reported that he has to stand up and sit down often. He reported that he is also experiencing panic attacks. His sister reported that he is not doing well since his last appointment. Patient reported that he is feeling nervous and anxious. He reported that he wants his medications to be adjusted. His sister reported that they were thinking about admitting him to the hospital as she felt that the medications are making him feel worse at this time. He appeared apprehensive during the interview. He reported that he is compliant with the medications but they are not working well at this time. He reported that he does not sleep well at night.  Patient currently denied using any drugs. His sister is very supportive of him at this time. I reviewed his records as patient was evaluated by Drs. Kela Millin and Shipman in the past. His sister remains involved in his care at this time.  We discussed about the medications at length. He agreed for medication adjustment.    Patient currently denied having any suicidal homicidal ideations or plans. He denied having any perceptual disturbances.   Chief Complaint:  Chief Complaint    Follow-up; Medication Refill     Visit Diagnosis:     ICD-9-CM ICD-10-CM   1. Major depression, recurrent, chronic (HCC) 296.30 F33.9   2. Panic disorder with agoraphobia and moderate panic attacks 300.21 F40.01   3. Antipsychotic-induced akathisia 333.99 G25.71    E939.3 T43.505A     Past Medical History:  Past Medical History:  Diagnosis Date  . Abdominal abscess ([place])   . Acute pancreatitis   . Anxiety   . Asthma   . Back pain   . Depression   . Diabetes mellitus without complication  ([place])   . Emphysema of lung ([place])    right sided  . GERD (gastroesophageal reflux disease)   . Hemorrhage into subarachnoid space of neuraxis ([place]) 01/26/2010  . Hypertension   . Mild cognitive impairment   . MRSA (methicillin resistant Staphylococcus aureus)   . Obesity   . Panic disorder   . Perforated bowel ([place]) 2007   Tempoary Colostomy Bag, Skin Graft for Abd wound  . Sleep apnea    sleep study 2013  . Subarachnoid hemorrhage ([place]) 2011   coil placed  . Type 2 diabetes mellitus without complication, without long-term current use of insulin ([place]) 04/17/2016  . Vitreous hemorrhage ([place]) 03/27/2011   Overview:  Bilateral; 01/2010, from [facility]     Past Surgical History:  Procedure Laterality Date  . COLON SURGERY  2007   colostomy bag placed s/p perforated bowel  . KNEE SURGERY Right   . Perforated bowel     Family History:  Family History  Problem Relation Age of Onset  . Arthritis Mother   . Asthma Mother   . Diabetes Mother   . Heart disease Mother   . Hyperlipidemia Mother   . Hypertension Mother   . Kidney disease Mother   . Thyroid disease Mother   . Lung disease Mother   . Anxiety disorder Mother   . Depression Mother   . Diabetes Father   . Heart disease Father   . Depression Father   . Anxiety  disorder Father   . Arthritis Sister   . Asthma Sister   . Hyperlipidemia Sister   . Hypertension Sister   . Lung disease Sister   . Anxiety disorder Sister   . Depression Sister   . Hyperlipidemia Brother   . Hypertension Brother   . Diabetes Sister   . Heart disease Sister   . Depression Sister   . Anxiety disorder Sister   . Anxiety disorder Brother   . Depression Brother   . Heart disease Brother    Social History:  Social History   Social History  . Marital status: Divorced    Spouse name: N/A  . Number of children: N/A  . Years of education: N/A   Social History Main Topics  . Smoking status: Current Every Day Smoker    Packs/day: 1.50    Types:  Cigarettes    Start date: 10/11/1984  . Smokeless tobacco: Never Used  . Alcohol use 0.0 oz/week     Comment: Rarely  . Drug use: No  . Sexual activity: Not Currently   Other Topics Concern  . None   Social History Narrative  . None   Additional History:   Assessment:   Musculoskeletal: Strength & Muscle Tone: within normal limits Gait & Station: normal Patient leans: N/A  Psychiatric Specialty Exam: Medication Refill  Associated symptoms include abdominal pain, headaches and myalgias.    Review of Systems  Constitutional: Positive for malaise/fatigue.  Gastrointestinal: Positive for abdominal pain and diarrhea.  Musculoskeletal: Positive for joint pain and myalgias.  Neurological: Positive for headaches.  Psychiatric/Behavioral: Positive for depression. Negative for hallucinations, memory loss, substance abuse and suicidal ideas. The patient is nervous/anxious and has insomnia.   All other systems reviewed and are negative.   Blood pressure (!) 178/113, pulse 83, temperature 97.8 F (36.6 C), temperature source Oral, weight 236 lb 12.8 oz (107.4 kg).Body mass index is 37.09 kg/m.  General Appearance: Neat and Well Groomed  Eye Contact:  Good  Speech:  Normal Rate  Volume:  Normal  Mood:  A little better  Affect:  Constricted  Thought Process:  Linear and Logical  Orientation:  Full (Time, Place, and Person)  Thought Content:  Negative  Suicidal Thoughts:  No  Homicidal Thoughts:  No  Memory:  Immediate;   Good Recent;   Good Remote;   Good  Judgement:  Good  Insight:  Good  Psychomotor Activity:  Negative  Concentration:  Good  Recall:  Good  Fund of Knowledge: Good  Language: Good  Akathisia:  Negative  Handed:    AIMS (if indicated):    Assets:  Communication Skills Desire for Improvement Social Support  ADL's:  Intact  Cognition: WNL  Sleep:  poor   Is the patient at risk to self?  No. Has the patient been a risk to self in the past 6 months?   No. Has the patient been a risk to self within the distant past?  No. Is the patient a risk to others?  No. Has the patient been a risk to others in the past 6 months?  No. Has the patient been a risk to others within the distant past?  No.  Current Medications: Current Outpatient Prescriptions  Medication Sig Dispense Refill  . albuterol (PROVENTIL HFA;VENTOLIN HFA) 108 (90 Base) MCG/ACT inhaler Inhale 1-2 puffs into the lungs every 6 (six) hours as needed for wheezing or shortness of breath. 18 g 3  . amLODipine (NORVASC) 10 MG tablet Take 1  tablet (10 mg total) by mouth daily. 90 tablet 3  . aspirin EC 81 MG tablet Take 81 mg by mouth daily.    . clindamycin (CLEOCIN) 300 MG capsule Take 1 capsule (300 mg total) by mouth 3 (three) times daily. 30 capsule 0  . DULoxetine (CYMBALTA) 60 MG capsule Take 1 capsule (60 mg total) by mouth daily. 30 capsule 1  . fluticasone (FLONASE) 50 MCG/ACT nasal spray Place 2 sprays into both nostrils daily. 16 g 12  . Fluticasone-Salmeterol (ADVAIR DISKUS) 250-50 MCG/DOSE AEPB Frequency:BID   Dosage:0.0     Instructions:  Note:Dose: 1 PUFF 60 each 6  . lisinopril (PRINIVIL,ZESTRIL) 30 MG tablet take 1 tablet by mouth once daily 90 tablet 0  . omeprazole (PRILOSEC) 40 MG capsule Take 1 capsule (40 mg total) by mouth daily. 90 capsule 1  . tiZANidine (ZANAFLEX) 2 MG tablet Take 2 mg by mouth every 6 (six) hours as needed.    . clonazePAM (KLONOPIN) 0.5 MG tablet Take 1 tablet (0.5 mg total) by mouth 2 (two) times daily as needed for anxiety. 60 tablet 0  . mirtazapine (REMERON) 15 MG tablet Take 1 tablet (15 mg total) by mouth at bedtime. 30 tablet 0   No current facility-administered medications for this visit.     Medical Decision Making:  Established Problem, Stable/Improving (1), Review of Medication Regimen & Side Effects (2) and Review of New Medication or Change in Dosage (2)  Treatment Plan Summary:Medication management   Discussed with patient  about the medications treatment risk benefits and alternatives He is currently experiencing akathisia related to the Abilify. I will discontinue the Abilify.  I will also decrease Cymbalta 60 mg daily. I will start him on Remeron 15 mg by mouth daily at bedtime for his depression and he agreed with the plan. DC propranolol Advised him to take Benadryl on a when necessary basis I will start him on Klonopin 0.5 mg by mouth twice a day when necessary for his anxiety Advised his sister to monitor his medications and she agreed with the plan. Advised his sister to bring him him back for an early appointment if he is not improving He will follow up in 2 weeks or earlier depending on his symptoms    More than 50% of the time spent in psychoeducation, counseling and coordination of care.    This note was generated in part or whole with voice recognition software. Voice regonition is usually quite accurate but there are transcription errors that can and very often do occur. I apologize for any typographical errors that were not detected and corrected.   Rainey Pines, MD  10/03/2016, 11:26 AM

## 2016-10-07 ENCOUNTER — Other Ambulatory Visit: Payer: Self-pay | Admitting: Family Medicine

## 2016-10-08 ENCOUNTER — Encounter: Payer: Medicare Other | Admitting: Family Medicine

## 2016-10-08 ENCOUNTER — Other Ambulatory Visit: Payer: Self-pay

## 2016-10-08 NOTE — Telephone Encounter (Signed)
Your patient 

## 2016-10-09 ENCOUNTER — Encounter: Payer: Self-pay | Admitting: Family Medicine

## 2016-10-09 ENCOUNTER — Ambulatory Visit (INDEPENDENT_AMBULATORY_CARE_PROVIDER_SITE_OTHER): Payer: Medicare Other | Admitting: Family Medicine

## 2016-10-09 VITALS — BP 155/103 | HR 99 | Temp 98.7°F | Wt 241.0 lb

## 2016-10-09 DIAGNOSIS — I129 Hypertensive chronic kidney disease with stage 1 through stage 4 chronic kidney disease, or unspecified chronic kidney disease: Secondary | ICD-10-CM | POA: Diagnosis not present

## 2016-10-09 DIAGNOSIS — J449 Chronic obstructive pulmonary disease, unspecified: Secondary | ICD-10-CM

## 2016-10-09 DIAGNOSIS — F331 Major depressive disorder, recurrent, moderate: Secondary | ICD-10-CM | POA: Diagnosis not present

## 2016-10-09 MED ORDER — BUDESONIDE-FORMOTEROL FUMARATE 160-4.5 MCG/ACT IN AERO: 2.0000 | INHALATION_SPRAY | Freq: Two times a day (BID) | RESPIRATORY_TRACT | 12 refills | Status: DC

## 2016-10-09 MED ORDER — METOPROLOL SUCCINATE ER 25 MG PO TB24: 25.0000 mg | ORAL_TABLET | Freq: Every day | ORAL | 3 refills | Status: DC

## 2016-10-09 NOTE — Patient Instructions (Addendum)
Add back metoprolol once a day along with lisinopril and 10 mg amlodipine Start taking symbicort daily Discuss adding topamax back on with pain management  If you haven't heard from Neurology by next week, let us know

## 2016-10-09 NOTE — Assessment & Plan Note (Signed)
Defer to psychiatry

## 2016-10-09 NOTE — Progress Notes (Signed)
BP (!) 155/103   Pulse 99   Temp 98.7 F (37.1 C)   Wt 241 lb (109.3 kg)   SpO2 95%   BMI 37.75 kg/m    Subjective:    Patient ID: Derek Cooley., male    DOB: 03-31-1966, 50 y.o.   MRN: [id number]  HPI: Derek Cooley. is a 50 y.o. male  Chief Complaint  Patient presents with  . Fatigue    just feels like he "can't go", breathing is worse, pain is worse. sister thinks his depression is worse.    Patient presents with worsening fatigue. States he feels like he lays around all day every day and doesn't have any energy to do things. Sees psychiatry for severe depression. Also seeing pain management for chonic pain issues. Has undergone several medication changes lately by both which has occurred within the timeframe of his worsening fatigue. Has had full panel of basic labs within last 6 months, results normal. Does not sleep well due to chronic pain.   Also feels like his breathing isn't doing well. Currently taking albuterol inhaler as needed but could not afford the advair he was given so has not been on any other inhalers. No fever, chills, productive cough, or URI sxs, just wheezing and chest tightness. Still smoking about 1 to 1.5 packs per day, which is a decrease from before. Eventually wanting to quit but not right now.   BP has still been running high, worsening his HAs and causing ringing in his ears. Amlodipine was increased to 10 mg and metoprolol was added all at last visit. Psychiatrist shortly after d/c'ed metoprolol and added propranolol, but took him back off of that 2 weeks later. Has only been on the lisinopril and amlodipine since then and has been having elevated readings. No adverse effects reported while on the metoprolol.   Relevant past medical, surgical, family and social history reviewed and updated as indicated. Interim medical history since our last visit reviewed. Allergies and medications reviewed and updated.  Review of Systems  Constitutional:  Negative.   HENT: Positive for tinnitus.   Eyes: Negative.   Respiratory: Positive for chest tightness and shortness of breath.   Cardiovascular: Negative.   Gastrointestinal: Positive for abdominal pain.  Genitourinary: Negative.   Musculoskeletal: Positive for arthralgias.  Skin: Negative.   Neurological: Positive for headaches.  Psychiatric/Behavioral: Positive for dysphoric mood and sleep disturbance. Negative for suicidal ideas.    Per HPI unless specifically indicated above     Objective:    BP (!) 155/103   Pulse 99   Temp 98.7 F (37.1 C)   Wt 241 lb (109.3 kg)   SpO2 95%   BMI 37.75 kg/m   Wt Readings from Last 3 Encounters:  10/09/16 241 lb (109.3 kg)  10/03/16 236 lb 12.8 oz (107.4 kg)  09/05/16 235 lb (106.6 kg)    Physical Exam  Constitutional: He appears well-developed and well-nourished. No distress.  HENT:  Head: Atraumatic.  Eyes: Conjunctivae are normal. No scleral icterus.  Neck: Normal range of motion. Neck supple.  Cardiovascular: Normal rate, regular rhythm and normal heart sounds.   Pulmonary/Chest: Effort normal. No respiratory distress. He has wheezes (diffuse wheezes b/l).  Musculoskeletal: Normal range of motion.  Lymphadenopathy:    He has no cervical adenopathy.  Neurological: He is alert. He exhibits normal muscle tone. Coordination normal.  Skin: Skin is warm and dry.  Psychiatric: He has a normal mood and affect. His behavior is  normal.        Assessment & Plan:   Problem List Items Addressed This Visit      Respiratory   COPD (chronic obstructive pulmonary disease) ([place])    Symbicort sample and savings card given, continue prn albuterol       Relevant Medications   budesonide-formoterol (SYMBICORT) 160-4.5 MCG/ACT inhaler     Genitourinary   Benign hypertensive renal disease    Restart metoprolol, continue lisinopril and 10 mg amlodipine. Recheck BP in 1 month        Other   Depression - Primary    Defer to  psychiatry       Other Visit Diagnoses   None.      Follow up plan: Return in about 4 weeks (around 11/06/2016) for BP Check.

## 2016-10-09 NOTE — Assessment & Plan Note (Signed)
Symbicort sample and savings card given, continue prn albuterol

## 2016-10-09 NOTE — Assessment & Plan Note (Signed)
Restart metoprolol, continue lisinopril and 10 mg amlodipine. Recheck BP in 1 month

## 2016-10-17 ENCOUNTER — Ambulatory Visit: Payer: Medicare Other | Admitting: Psychiatry

## 2016-10-23 DIAGNOSIS — R109 Unspecified abdominal pain: Secondary | ICD-10-CM | POA: Diagnosis not present

## 2016-10-23 DIAGNOSIS — M62838 Other muscle spasm: Secondary | ICD-10-CM | POA: Diagnosis not present

## 2016-10-23 DIAGNOSIS — M5414 Radiculopathy, thoracic region: Secondary | ICD-10-CM | POA: Diagnosis not present

## 2016-10-23 DIAGNOSIS — G8929 Other chronic pain: Secondary | ICD-10-CM | POA: Diagnosis not present

## 2016-10-23 DIAGNOSIS — R51 Headache: Secondary | ICD-10-CM | POA: Diagnosis not present

## 2016-11-08 ENCOUNTER — Ambulatory Visit: Payer: Medicare Other | Admitting: Family Medicine

## 2016-11-12 ENCOUNTER — Ambulatory Visit (INDEPENDENT_AMBULATORY_CARE_PROVIDER_SITE_OTHER): Payer: Medicare Other | Admitting: Family Medicine

## 2016-11-12 ENCOUNTER — Encounter: Payer: Self-pay | Admitting: Family Medicine

## 2016-11-12 VITALS — BP 154/94 | HR 73 | Temp 98.2°F | Wt 239.0 lb

## 2016-11-12 DIAGNOSIS — Z720 Tobacco use: Secondary | ICD-10-CM | POA: Diagnosis not present

## 2016-11-12 DIAGNOSIS — I129 Hypertensive chronic kidney disease with stage 1 through stage 4 chronic kidney disease, or unspecified chronic kidney disease: Secondary | ICD-10-CM

## 2016-11-12 MED ORDER — METOPROLOL SUCCINATE ER 50 MG PO TB24: 50.0000 mg | ORAL_TABLET | Freq: Every day | ORAL | 3 refills | Status: DC

## 2016-11-12 MED ORDER — LISINOPRIL 40 MG PO TABS: 40.0000 mg | ORAL_TABLET | Freq: Every day | ORAL | 3 refills | Status: DC

## 2016-11-12 NOTE — Progress Notes (Signed)
   BP (!) 154/94   Pulse 73   Temp 98.2 F (36.8 C)   Wt 239 lb (108.4 kg)   SpO2 97%   BMI 37.43 kg/m    Subjective:    Patient ID: Derek Cooley., male    DOB: 1965-12-27, 50 y.o.   MRN: [id number]  HPI: [name]. is a 51 y.o. male  Chief Complaint  Patient presents with  . Hypertension   Patient presents for BP follow up. Taking his medicines faithfully without side effects, but BP still persistently elevated. Pain medicine wants to put him on a new medicine but states that his BP has to be under control first. Working on being more active (as tolerated, given his chronic pain and other conditions). Still smoking, hoping to start the cessation process once his pain issues are under better control.   Relevant past medical, surgical, family and social history reviewed and updated as indicated. Interim medical history since our last visit reviewed. Allergies and medications reviewed and updated.  Review of Systems  Constitutional: Negative.   HENT: Negative.   Respiratory: Negative.   Cardiovascular: Negative.   Gastrointestinal: Negative.   Genitourinary: Negative.   Skin: Negative.   Neurological: Negative.   Psychiatric/Behavioral: Negative.     Per HPI unless specifically indicated above     Objective:    BP (!) 154/94   Pulse 73   Temp 98.2 F (36.8 C)   Wt 239 lb (108.4 kg)   SpO2 97%   BMI 37.43 kg/m   Wt Readings from Last 3 Encounters:  11/12/16 239 lb (108.4 kg)  10/09/16 241 lb (109.3 kg)  08/27/16 232 lb (105.2 kg)    Physical Exam  Constitutional: He is oriented to person, place, and time. He appears well-developed and well-nourished. No distress.  HENT:  Head: Atraumatic.  Eyes: Conjunctivae are normal. No scleral icterus.  Neck: Normal range of motion. Neck supple.  Cardiovascular: Normal rate.   Pulmonary/Chest: Effort normal. No respiratory distress.  Musculoskeletal: Normal range of motion.  Neurological: He is alert and  oriented to person, place, and time.  Skin: Skin is warm and dry.  Psychiatric: He has a normal mood and affect. His behavior is normal.  Nursing note and vitals reviewed.     Assessment & Plan:   Problem List Items Addressed This Visit      Genitourinary   Benign hypertensive renal disease - Primary    Continue amlodipine, increase lisinopril to 40 mg and metoprolol to 50 mg. Monitor at home, follow up in 2 weeks for recheck        Other   Tobacco abuse    Long discussion today, patient not ready but hopes to be ready in the next year or so once some of his other issues start to resolve          Follow up plan: Return in about 2 weeks (around 11/26/2016) for BP, A1C, lipid, CMP.

## 2016-11-12 NOTE — Patient Instructions (Signed)
Take: Amlodipine 10 mg Metoprolol 50 mg (can take two 25 mg tabs until those are used up) Lisinopril 40 mg

## 2016-11-13 NOTE — Assessment & Plan Note (Signed)
Long discussion today, patient not ready but hopes to be ready in the next year or so once some of his other issues start to resolve

## 2016-11-13 NOTE — Assessment & Plan Note (Signed)
Continue amlodipine, increase lisinopril to 40 mg and metoprolol to 50 mg. Monitor at home, follow up in 2 weeks for recheck

## 2016-11-26 ENCOUNTER — Ambulatory Visit: Payer: Medicare Other | Admitting: Family Medicine

## 2016-12-13 ENCOUNTER — Other Ambulatory Visit: Payer: Self-pay | Admitting: Psychiatry

## 2016-12-17 NOTE — Telephone Encounter (Signed)
request a refill on clonazepam.5mg 

## 2016-12-17 NOTE — Telephone Encounter (Signed)
called in left message on doctors' line rx for clonazepam 0.5mg  take 1 tablet by mouth twice a day for anxiety. #60 with one refill.

## 2017-01-21 ENCOUNTER — Encounter: Payer: Self-pay | Admitting: Psychiatry

## 2017-01-21 ENCOUNTER — Ambulatory Visit (INDEPENDENT_AMBULATORY_CARE_PROVIDER_SITE_OTHER): Payer: 59 | Admitting: Psychiatry

## 2017-01-21 VITALS — BP 196/102 | HR 80 | Temp 98.3°F | Wt 239.6 lb

## 2017-01-21 DIAGNOSIS — F339 Major depressive disorder, recurrent, unspecified: Secondary | ICD-10-CM

## 2017-01-21 DIAGNOSIS — F4001 Agoraphobia with panic disorder: Secondary | ICD-10-CM

## 2017-01-21 MED ORDER — MIRTAZAPINE 15 MG PO TABS: 15.0000 mg | ORAL_TABLET | Freq: Every day | ORAL | 1 refills | Status: DC

## 2017-01-21 MED ORDER — CLONAZEPAM 0.5 MG PO TABS: 0.5000 mg | ORAL_TABLET | Freq: Two times a day (BID) | ORAL | 1 refills | Status: DC | PRN

## 2017-01-21 MED ORDER — DULOXETINE HCL 60 MG PO CPEP: 60.0000 mg | ORAL_CAPSULE | Freq: Every day | ORAL | 1 refills | Status: DC

## 2017-01-21 NOTE — Progress Notes (Signed)
BH MD/PA/NP OP Progress Note  01/21/2017 10:22 AM Derek Cooley.  MRN:  [id number]  Subjective:  Patient is a 51 year old male who presented for follow-up appointment accompanied by his sister Derek Cooley. Patient was last seen in October. He reported that he has been compliant with his medications. He reported that his blood pressure remains elevated. He has been trying to get his blood pressure controlled with the help of his primary care physician. Patient reported that he sleeps during the daytime. He spends time watching video games. He reported that he is also following with a therapist in [place]. He appeared calm and alert during the interview. His sister has been helping him with the medications. He does not have any adverse effects of the medications at this time. He denied having any suicidal homicidal ideations or plans. We discussed about his medications in detail. Also advised patient to start doing some relaxation techniques and he agreed with the plan.   Chief Complaint:  Chief Complaint    Follow-up; Medication Refill     Visit Diagnosis:     ICD-9-CM ICD-10-CM   1. Major depression, recurrent, chronic (HCC) 296.30 F33.9   2. Panic disorder with agoraphobia and moderate panic attacks 300.21 F40.01     Past Medical History:  Past Medical History:  Diagnosis Date  . Abdominal abscess ([place])   . Acute pancreatitis   . Anxiety   . Asthma   . Back pain   . Depression   . Diabetes mellitus without complication ([place])   . Emphysema of lung ([place])    right sided  . GERD (gastroesophageal reflux disease)   . 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 Hypertension   . Mild cognitive impairment   . MRSA (methicillin resistant Staphylococcus aureus)   . Obesity   . Panic disorder   . Perforated bowel ([place]) 2007   Tempoary Colostomy Bag, Skin Graft for Abd wound  . Sleep apnea    sleep study 2013  . Subarachnoid hemorrhage ([place]) 2011   coil placed   . Type 2 diabetes mellitus without complication, without long-term current use of insulin ([place]) 04/17/2016  . Vitreous hemorrhage ([place]) 03/27/2011   Overview:  Bilateral; 01/2010, from [facility]     Past Surgical History:  Procedure Laterality Date  . COLON SURGERY  2007   colostomy bag placed s/p perforated bowel  . KNEE SURGERY Right   . Perforated bowel     Family History:  Family History  Problem Relation Age of Onset  . Arthritis Mother   . Asthma Mother   . Diabetes Mother   . Heart disease Mother   . Hyperlipidemia Mother   . Hypertension Mother   . Kidney disease Mother   . Thyroid disease Mother   . Lung disease Mother   . Anxiety disorder Mother   . Depression Mother   . Diabetes Father   . Heart disease Father   . Depression Father   . Anxiety disorder Father   . Arthritis Sister   . Asthma Sister   . Hyperlipidemia Sister   . Hypertension Sister   . Lung disease Sister   . Anxiety disorder Sister   . Depression Sister   . Hyperlipidemia Brother   . Hypertension Brother   . Diabetes Sister   . Heart disease Sister   . Depression Sister   . Anxiety disorder Sister   . Anxiety disorder Brother   . Depression Brother   . Heart  disease Brother    Social History:  Social History   Social History  . Marital status: Divorced    Spouse name: N/A  . Number of children: N/A  . Years of education: N/A   Social History Main Topics  . Smoking status: Current Every Day Smoker    Packs/day: 1.50    Types: Cigarettes    Start date: 10/11/1984  . Smokeless tobacco: Never Used  . Alcohol use 0.0 oz/week     Comment: Rarely  . Drug use: No  . Sexual activity: Not Currently   Other Topics Concern  . None   Social History Narrative  . None   Additional History:   Assessment:   Musculoskeletal: Strength & Muscle Tone: within normal limits Gait & Station: normal Patient leans: N/A  Psychiatric Specialty Exam: Medication Refill  Associated symptoms include  abdominal pain, headaches and myalgias.    Review of Systems  Constitutional: Positive for malaise/fatigue.  Gastrointestinal: Positive for abdominal pain and diarrhea.  Musculoskeletal: Positive for joint pain and myalgias.  Neurological: Positive for headaches.  Psychiatric/Behavioral: Positive for depression. Negative for hallucinations, memory loss, substance abuse and suicidal ideas. The patient is nervous/anxious and has insomnia.   All other systems reviewed and are negative.   Blood pressure (!) 196/102, pulse 80, temperature 98.3 F (36.8 C), temperature source Oral, weight 239 lb 9.6 oz (108.7 kg).Body mass index is 37.53 kg/m.  General Appearance: Neat and Well Groomed  Eye Contact:  Good  Speech:  Normal Rate  Volume:  Normal  Mood:  Anxious and good  Affect:  Appropriate  Thought Process:  Linear and Logical  Orientation:  Full (Time, Place, and Person)  Thought Content:  Negative  Suicidal Thoughts:  No  Homicidal Thoughts:  No  Memory:  Immediate;   Good Recent;   Good Remote;   Good  Judgement:  Good  Insight:  Good  Psychomotor Activity:  Negative  Concentration:  Good  Recall:  Good  Fund of Knowledge: Good  Language: Good  Akathisia:  Negative  Handed:    AIMS (if indicated):    Assets:  Communication Skills Desire for Improvement Social Support  ADL's:  Intact  Cognition: WNL  Sleep: fair   Is the patient at risk to self?  No. Has the patient been a risk to self in the past 6 months?  No. Has the patient been a risk to self within the distant past?  No. Is the patient a risk to others?  No. Has the patient been a risk to others in the past 6 months?  No. Has the patient been a risk to others within the distant past?  No.  Current Medications: Current Outpatient Prescriptions  Medication Sig Dispense Refill  . albuterol (PROVENTIL HFA;VENTOLIN HFA) 108 (90 Base) MCG/ACT inhaler Inhale 1-2 puffs into the lungs every 6 (six) hours as needed for  wheezing or shortness of breath. 18 g 3  . amLODipine (NORVASC) 10 MG tablet Take 1 tablet (10 mg total) by mouth daily. 90 tablet 3  . aspirin EC 81 MG tablet Take 81 mg by mouth daily.    . budesonide-formoterol (SYMBICORT) 160-4.5 MCG/ACT inhaler Inhale 2 puffs into the lungs 2 (two) times daily. 1 Inhaler 12  . clonazePAM (KLONOPIN) 0.5 MG tablet Take 1 tablet (0.5 mg total) by mouth 2 (two) times daily as needed for anxiety. 60 tablet 1  . doxycycline (VIBRAMYCIN) 100 MG capsule Take 100 mg by mouth 2 (two) times daily.    Marland Kitchen  DULoxetine (CYMBALTA) 60 MG capsule Take 1 capsule (60 mg total) by mouth daily. 30 capsule 1  . fluticasone (FLONASE) 50 MCG/ACT nasal spray Place 2 sprays into both nostrils daily. 16 g 12  . Fluticasone-Salmeterol (ADVAIR DISKUS) 250-50 MCG/DOSE AEPB Frequency:BID   Dosage:0.0     Instructions:  Note:Dose: 1 PUFF 60 each 6  . lisinopril (PRINIVIL,ZESTRIL) 40 MG tablet Take 1 tablet (40 mg total) by mouth daily. 30 tablet 3  . metoprolol succinate (TOPROL-XL) 50 MG 24 hr tablet Take 1 tablet (50 mg total) by mouth daily. Take with or immediately following a meal. 30 tablet 3  . mirtazapine (REMERON) 15 MG tablet Take 1 tablet (15 mg total) by mouth at bedtime. 30 tablet 1  . omeprazole (PRILOSEC) 40 MG capsule take 1 capsule by mouth once daily 90 capsule 1  . tiZANidine (ZANAFLEX) 2 MG tablet Take 2 mg by mouth every 6 (six) hours as needed.    . topiramate (TOPAMAX) 50 MG tablet Take 50 mg by mouth 2 (two) times daily.     No current facility-administered medications for this visit.     Medical Decision Making:  Established Problem, Stable/Improving (1), Review of Medication Regimen & Side Effects (2) and Review of New Medication or Change in Dosage (2)  Treatment Plan Summary:Medication management   Discussed with patient about the medications treatment risk benefits and alternatives Continue Cymbalta 60 mg daily. continue Remeron 15 mg by mouth daily at  bedtime for his depression and he agreed with the plan. Continue Klonopin 0.5 mg by mouth twice a day when necessary for his anxiety Advised his sister to monitor his medications and she agreed with the plan.   Follow-up in 2 months or earlier depending on his symptoms and he agreed with the plan.   More than 50% of the time spent in psychoeducation, counseling and coordination of care.    This note was generated in part or whole with voice recognition software. Voice regonition is usually quite accurate but there are transcription errors that can and very often do occur. I apologize for any typographical errors that were not detected and corrected.   Rainey Pines, MD  01/21/2017, 10:22 AM

## 2017-01-23 DIAGNOSIS — M5414 Radiculopathy, thoracic region: Secondary | ICD-10-CM | POA: Diagnosis not present

## 2017-01-23 DIAGNOSIS — G8929 Other chronic pain: Secondary | ICD-10-CM | POA: Diagnosis not present

## 2017-01-23 DIAGNOSIS — M791 Myalgia: Secondary | ICD-10-CM | POA: Diagnosis not present

## 2017-01-23 DIAGNOSIS — M62838 Other muscle spasm: Secondary | ICD-10-CM | POA: Diagnosis not present

## 2017-01-23 DIAGNOSIS — R109 Unspecified abdominal pain: Secondary | ICD-10-CM | POA: Diagnosis not present

## 2017-01-23 DIAGNOSIS — R51 Headache: Secondary | ICD-10-CM | POA: Diagnosis not present

## 2017-02-11 ENCOUNTER — Ambulatory Visit (INDEPENDENT_AMBULATORY_CARE_PROVIDER_SITE_OTHER): Payer: Medicare Other | Admitting: Family Medicine

## 2017-02-11 ENCOUNTER — Encounter: Payer: Self-pay | Admitting: Family Medicine

## 2017-02-11 VITALS — BP 156/96 | HR 76 | Temp 98.9°F | Wt 241.0 lb

## 2017-02-11 DIAGNOSIS — E7800 Pure hypercholesterolemia, unspecified: Secondary | ICD-10-CM

## 2017-02-11 DIAGNOSIS — J449 Chronic obstructive pulmonary disease, unspecified: Secondary | ICD-10-CM | POA: Diagnosis not present

## 2017-02-11 DIAGNOSIS — E119 Type 2 diabetes mellitus without complications: Secondary | ICD-10-CM

## 2017-02-11 DIAGNOSIS — R51 Headache: Secondary | ICD-10-CM | POA: Diagnosis not present

## 2017-02-11 DIAGNOSIS — R519 Headache, unspecified: Secondary | ICD-10-CM

## 2017-02-11 DIAGNOSIS — I129 Hypertensive chronic kidney disease with stage 1 through stage 4 chronic kidney disease, or unspecified chronic kidney disease: Secondary | ICD-10-CM | POA: Diagnosis not present

## 2017-02-11 NOTE — Progress Notes (Signed)
BP (!) 156/96   Pulse 76   Temp 98.9 F (37.2 C)   Wt 241 lb (109.3 kg)   SpO2 99%   BMI 37.75 kg/m    Subjective:    Patient ID: Derek Binet., male    DOB: Jun 04, 1966, 51 y.o.   MRN: [id number]  HPI: Derek Youtz. is a 51 y.o. male  Chief Complaint  Patient presents with  . Hypertension    BP been running higher lately.    Patient presents to discuss persistently elevated BPs. Does not check at home, but has been elevated at all doctor appts recently. Denies CP, dizziness, HAs. Taking medicines faithfully without side effects.   Also is overdue for fasting labs for lipid, as well as diabetes follow up. Will come in fasting in the next few days to get these drawn. Diet controlled at this time.   Having persistent mild wheezes as well. Using albuterol and symbicort as directed. Still smoking, not ready to quit at this time given everything else going on in his life.   Was unable to complete Neurology referral when previously placed as contact number was not who it needed to be. They would like a referral placed again now that the number listed is accurate.   Past Medical History:  Diagnosis Date  . Abdominal abscess ([place])   . Acute pancreatitis   . Anxiety   . Asthma   . Back pain   . Depression   . Diabetes mellitus without complication ([place])   . Emphysema of lung ([place])    right sided  . GERD (gastroesophageal reflux disease)   . Hemorrhage into subarachnoid space of neuraxis ([place]) 01/26/2010  . Hypertension   . Mild cognitive impairment   . MRSA (methicillin resistant Staphylococcus aureus)   . Obesity   . Panic disorder   . Perforated bowel ([place]) 2007   Tempoary Colostomy Bag, Skin Graft for Abd wound  . Sleep apnea    sleep study 2013  . Subarachnoid hemorrhage ([place]) 2011   coil placed  . Type 2 diabetes mellitus without complication, without long-term current use of insulin ([place]) 04/17/2016  . Vitreous hemorrhage ([place]) 03/27/2011   Overview:   Bilateral; 01/2010, [facility]    Social History   Social History  . Marital status: Divorced    Spouse name: N/A  . Number of children: N/A  . Years of education: N/A   Occupational History  . Not on file.   Social History Main Topics  . Smoking status: Current Every Day Smoker    Packs/day: 1.50    Types: Cigarettes    Start date: 10/11/1984  . Smokeless tobacco: Never Used  . Alcohol use 0.0 oz/week     Comment: Rarely  . Drug use: No  . Sexual activity: Not Currently   Other Topics Concern  . Not on file   Social History Narrative  . No narrative on file    Relevant past medical, surgical, family and social history reviewed and updated as indicated. Interim medical history since our last visit reviewed. Allergies and medications reviewed and updated.  Review of Systems  Constitutional: Negative.   HENT: Negative.   Eyes: Negative.   Respiratory: Negative.   Cardiovascular: Negative.   Gastrointestinal: Negative.   Genitourinary: Negative.   Musculoskeletal: Negative.   Neurological: Negative.   Psychiatric/Behavioral: Negative.     Per HPI unless specifically indicated above     Objective:    BP (!) 156/96  Pulse 76   Temp 98.9 F (37.2 C)   Wt 241 lb (109.3 kg)   SpO2 99%   BMI 37.75 kg/m   Wt Readings from Last 3 Encounters:  02/11/17 241 lb (109.3 kg)  11/12/16 239 lb (108.4 kg)  10/09/16 241 lb (109.3 kg)    Physical Exam  Constitutional: He appears well-developed and well-nourished. No distress.  HENT:  Head: Atraumatic.  Eyes: Conjunctivae are normal. Pupils are equal, round, and reactive to light.  Neck: Normal range of motion. Neck supple.  Cardiovascular: Normal rate and normal heart sounds.   Pulmonary/Chest: Effort normal. No respiratory distress. He has wheezes (mild wheezes). He has no rales.  Musculoskeletal: Normal range of motion.  Neurological: He is alert.  Skin: Skin is warm and dry.  Psychiatric: He has a normal mood and  affect. His behavior is normal.  Nursing note and vitals reviewed.     Assessment & Plan:   Problem List Items Addressed This Visit      Respiratory   COPD (chronic obstructive pulmonary disease) ([place])    Will add spiriva to current regimen and assess for benefit. Sample given today. Discussed continuing to consider smoking cessation.         Endocrine   RESOLVED: Type 2 diabetes mellitus without complication, without long-term current use of insulin ([place])    Will recheck A1C when he comes back for labs this week      Relevant Orders   HgB A1c     Genitourinary   Benign hypertensive renal disease - Primary    Fairly maxed out on current medications, will hold off on increasing metoprolol so as not to drop his pulse too low.  Referral to nephrology placed for further management.       Relevant Orders   Ambulatory referral to Nephrology   Comprehensive metabolic panel     Other   High blood cholesterol    Will come in for fasting labs in the next few days      Relevant Orders   Lipid Panel w/o Chol/HDL Ratio    Other Visit Diagnoses    Intractable headache, unspecified chronicity pattern, unspecified headache type       Referral re-generated today   Relevant Orders   Ambulatory referral to Neurology       Follow up plan: Return in about 3 months (around 05/14/2017) for Physical Exam.

## 2017-02-11 NOTE — Assessment & Plan Note (Signed)
Fairly maxed out on current medications, will hold off on increasing metoprolol so as not to drop his pulse too low.  Referral to nephrology placed for further management.

## 2017-02-11 NOTE — Assessment & Plan Note (Signed)
Will recheck A1C when he comes back for labs this week

## 2017-02-11 NOTE — Assessment & Plan Note (Signed)
Will come in for fasting labs in the next few days

## 2017-02-11 NOTE — Patient Instructions (Signed)
Follow up as needed

## 2017-02-11 NOTE — Assessment & Plan Note (Signed)
Will add spiriva to current regimen and assess for benefit. Sample given today. Discussed continuing to consider smoking cessation.

## 2017-02-26 DIAGNOSIS — I10 Essential (primary) hypertension: Secondary | ICD-10-CM | POA: Diagnosis not present

## 2017-02-26 DIAGNOSIS — N182 Chronic kidney disease, stage 2 (mild): Secondary | ICD-10-CM | POA: Diagnosis not present

## 2017-03-04 DIAGNOSIS — M7542 Impingement syndrome of left shoulder: Secondary | ICD-10-CM | POA: Diagnosis not present

## 2017-04-01 ENCOUNTER — Encounter: Payer: Self-pay | Admitting: Psychiatry

## 2017-04-01 ENCOUNTER — Ambulatory Visit (INDEPENDENT_AMBULATORY_CARE_PROVIDER_SITE_OTHER): Payer: 59 | Admitting: Psychiatry

## 2017-04-01 VITALS — BP 186/95 | HR 77 | Temp 98.8°F | Wt 237.0 lb

## 2017-04-01 DIAGNOSIS — F4001 Agoraphobia with panic disorder: Secondary | ICD-10-CM

## 2017-04-01 DIAGNOSIS — F339 Major depressive disorder, recurrent, unspecified: Secondary | ICD-10-CM | POA: Diagnosis not present

## 2017-04-01 MED ORDER — MIRTAZAPINE 15 MG PO TABS: 15.0000 mg | ORAL_TABLET | Freq: Every day | ORAL | 1 refills | Status: DC

## 2017-04-01 MED ORDER — CLONAZEPAM 0.5 MG PO TABS: 0.5000 mg | ORAL_TABLET | Freq: Two times a day (BID) | ORAL | 1 refills | Status: DC | PRN

## 2017-04-01 MED ORDER — DULOXETINE HCL 60 MG PO CPEP: 60.0000 mg | ORAL_CAPSULE | Freq: Two times a day (BID) | ORAL | 1 refills | Status: DC

## 2017-04-01 NOTE — Progress Notes (Signed)
BH MD/PA/NP OP Progress Note  04/01/2017 10:51 AM Derek Cooley.  MRN:  [id number]  Subjective:  Patient is a 51 year old male who presented for follow-up appointment . Marland Kitchen Patient reported that he has been compliant with his medications. He reported that his blood pressure remains elevated and was seen by the nephrologist who has started him on the medications. He reported that he is trying to lose weight and has been compliant with his medications. He is also trying to cut down on the cigarettes smoking. Patient stated that he stays at home most of the time. 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 He has been calm and alert during the interview.  He has lost 2 pounds since his last appointment. He reported that he remains anxious and apprehensive and is interested in having his medications adjusted. We discussed about his medications and he is not having any side effects at this time. He denied having any suicidal homicidal ideations or plans. His sister was present in the waiting room with him.  Advised patient to start exercise on a regular basis and start doing relaxation therapy.   Chief Complaint:  Chief Complaint    Follow-up; Medication Refill     Visit Diagnosis:     ICD-9-CM ICD-10-CM   1. Major depression, recurrent, chronic (HCC) 296.30 F33.9   2. Panic disorder with agoraphobia and moderate panic attacks 300.21 F40.01     Past Medical History:  Past Medical History:  Diagnosis Date  . Abdominal abscess   . Acute pancreatitis   . Anxiety   . Asthma   . Back pain   . Depression   . Diabetes mellitus without complication ([place])   . Emphysema of lung ([place])    right sided  . GERD (gastroesophageal reflux disease)   . 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 Hypertension   . Mild cognitive impairment   . MRSA (methicillin resistant Staphylococcus aureus)   . Obesity   . Panic disorder   . Perforated bowel ([place])  2007   Tempoary Colostomy Bag, Skin Graft for Abd wound  . Sleep apnea    sleep study 2013  . Subarachnoid hemorrhage ([place]) 2011   coil placed  . Type 2 diabetes mellitus without complication, without long-term current use of insulin ([place]) 04/17/2016  . Vitreous hemorrhage ([place]) 03/27/2011   Overview:  Bilateral; 01/2010, from [facility]     Past Surgical History:  Procedure Laterality Date  . COLON SURGERY  2007   colostomy bag placed s/p perforated bowel  . KNEE SURGERY Right   . Perforated bowel     Family History:  Family History  Problem Relation Age of Onset  . Arthritis Mother   . Asthma Mother   . Diabetes Mother   . Heart disease Mother   . Hyperlipidemia Mother   . Hypertension Mother   . Kidney disease Mother   . Thyroid disease Mother   . Lung disease Mother   . Anxiety disorder Mother   . Depression Mother   . Diabetes Father   . Heart disease Father   . Depression Father   . Anxiety disorder Father   . Arthritis Sister   . Asthma Sister   . Hyperlipidemia Sister   . Hypertension Sister   . Lung disease Sister   . Anxiety disorder Sister   . Depression Sister   . Hyperlipidemia Brother   . Hypertension Brother   . Diabetes Sister   .  Heart disease Sister   . Depression Sister   . Anxiety disorder Sister   . Anxiety disorder Brother   . Depression Brother   . Heart disease Brother    Social History:  Social History   Social History  . Marital status: Divorced    Spouse name: N/A  . Number of children: N/A  . Years of education: N/A   Social History Main Topics  . Smoking status: Current Every Day Smoker    Packs/day: 1.50    Types: Cigarettes    Start date: 10/11/1984  . Smokeless tobacco: Never Used  . Alcohol use 0.0 oz/week     Comment: Rarely  . Drug use: No  . Sexual activity: Not Currently   Other Topics Concern  . None   Social History Narrative  . None   Additional History:   Assessment:   Musculoskeletal: Strength & Muscle  Tone: within normal limits Gait & Station: normal Patient leans: N/A  Psychiatric Specialty Exam: Medication Refill  Associated symptoms include abdominal pain, headaches and myalgias.    Review of Systems  Constitutional: Positive for malaise/fatigue.  Gastrointestinal: Positive for abdominal pain and diarrhea.  Musculoskeletal: Positive for joint pain and myalgias.  Neurological: Positive for headaches.  Psychiatric/Behavioral: Positive for depression. Negative for hallucinations, memory loss, substance abuse and suicidal ideas. The patient is nervous/anxious and has insomnia.   All other systems reviewed and are negative.   Blood pressure (!) 186/95, pulse 77, temperature 98.8 F (37.1 C), temperature source Oral, weight 237 lb (107.5 kg).Body mass index is 37.12 kg/m.  General Appearance: Neat and Well Groomed  Eye Contact:  Good  Speech:  Normal Rate  Volume:  Normal  Mood:  Anxious and good  Affect:  Appropriate  Thought Process:  Linear and Logical  Orientation:  Full (Time, Place, and Person)  Thought Content:  Negative  Suicidal Thoughts:  No  Homicidal Thoughts:  No  Memory:  Immediate;   Good Recent;   Good Remote;   Good  Judgement:  Good  Insight:  Good  Psychomotor Activity:  Negative  Concentration:  Good  Recall:  Good  Fund of Knowledge: Good  Language: Good  Akathisia:  Negative  Handed:    AIMS (if indicated):    Assets:  Communication Skills Desire for Improvement Social Support  ADL's:  Intact  Cognition: WNL  Sleep: fair   Is the patient at risk to self?  No. Has the patient been a risk to self in the past 6 months?  No. Has the patient been a risk to self within the distant past?  No. Is the patient a risk to others?  No. Has the patient been a risk to others in the past 6 months?  No. Has the patient been a risk to others within the distant past?  No.  Current Medications: Current Outpatient Prescriptions  Medication Sig Dispense  Refill  . albuterol (PROVENTIL HFA;VENTOLIN HFA) 108 (90 Base) MCG/ACT inhaler Inhale 1-2 puffs into the lungs every 6 (six) hours as needed for wheezing or shortness of breath. 18 g 3  . amLODipine (NORVASC) 10 MG tablet Take 1 tablet (10 mg total) by mouth daily. 90 tablet 3  . aspirin EC 81 MG tablet Take 81 mg by mouth daily.    . budesonide-formoterol (SYMBICORT) 160-4.5 MCG/ACT inhaler Inhale 2 puffs into the lungs 2 (two) times daily. 1 Inhaler 12  . clonazePAM (KLONOPIN) 0.5 MG tablet Take 1 tablet (0.5 mg total) by mouth  2 (two) times daily as needed for anxiety. 60 tablet 1  . doxycycline (VIBRAMYCIN) 100 MG capsule Take 100 mg by mouth 2 (two) times daily.    . DULoxetine (CYMBALTA) 60 MG capsule Take 1 capsule (60 mg total) by mouth 2 (two) times daily. 60 capsule 1  . fluticasone (FLONASE) 50 MCG/ACT nasal spray Place 2 sprays into both nostrils daily. 16 g 12  . Fluticasone-Salmeterol (ADVAIR DISKUS) 250-50 MCG/DOSE AEPB Frequency:BID   Dosage:0.0     Instructions:  Note:Dose: 1 PUFF 60 each 6  . lisinopril (PRINIVIL,ZESTRIL) 40 MG tablet Take 1 tablet (40 mg total) by mouth daily. 30 tablet 3  . metoprolol succinate (TOPROL-XL) 50 MG 24 hr tablet Take 1 tablet (50 mg total) by mouth daily. Take with or immediately following a meal. 30 tablet 3  . mirtazapine (REMERON) 15 MG tablet Take 1 tablet (15 mg total) by mouth at bedtime. 30 tablet 1  . omeprazole (PRILOSEC) 40 MG capsule take 1 capsule by mouth once daily 90 capsule 1  . tiZANidine (ZANAFLEX) 2 MG tablet Take 2 mg by mouth every 6 (six) hours as needed.    . topiramate (TOPAMAX) 50 MG tablet Take 50 mg by mouth 2 (two) times daily.     No current facility-administered medications for this visit.     Medical Decision Making:  Established Problem, Stable/Improving (1), Review of Medication Regimen & Side Effects (2) and Review of New Medication or Change in Dosage (2)  Treatment Plan Summary:Medication management    Discussed with patient about the medications treatment risk benefits and alternatives Increase Cymbalta 60 mg BID continue Remeron 15 mg by mouth daily at bedtime for his depression and he agreed with the plan. Continue Klonopin 0.5 mg by mouth twice a day when necessary for his anxiety His  sister continues to monitor his medications and she agreed with the plan.   Follow-up in 2 months or earlier depending on his symptoms and he agreed with the plan.   More than 50% of the time spent in psychoeducation, counseling and coordination of care.    This note was generated in part or whole with voice recognition software. Voice regonition is usually quite accurate but there are transcription errors that can and very often do occur. I apologize for any typographical errors that were not detected and corrected.   Rainey Pines, MD  04/01/2017, 10:51 AM

## 2017-04-09 ENCOUNTER — Other Ambulatory Visit: Payer: Self-pay | Admitting: Family Medicine

## 2017-04-09 NOTE — Telephone Encounter (Signed)
Left detailed message to call and schedule an appt for CPE and lab work.

## 2017-04-09 NOTE — Telephone Encounter (Signed)
Please call pt and remind him to come back in for those fasting labs. He is also due for a CPE so if he would just like to go ahead and do them at the same time that would be great.

## 2017-05-17 DIAGNOSIS — R51 Headache: Secondary | ICD-10-CM | POA: Diagnosis not present

## 2017-05-17 DIAGNOSIS — H4310 Vitreous hemorrhage, unspecified eye: Secondary | ICD-10-CM | POA: Diagnosis not present

## 2017-05-17 DIAGNOSIS — G4440 Drug-induced headache, not elsewhere classified, not intractable: Secondary | ICD-10-CM | POA: Diagnosis not present

## 2017-05-17 DIAGNOSIS — M791 Myalgia: Secondary | ICD-10-CM | POA: Diagnosis not present

## 2017-05-17 DIAGNOSIS — G44229 Chronic tension-type headache, not intractable: Secondary | ICD-10-CM | POA: Diagnosis not present

## 2017-05-17 DIAGNOSIS — I609 Nontraumatic subarachnoid hemorrhage, unspecified: Secondary | ICD-10-CM | POA: Diagnosis not present

## 2017-05-27 ENCOUNTER — Encounter: Payer: Self-pay | Admitting: Psychiatry

## 2017-05-27 ENCOUNTER — Ambulatory Visit (INDEPENDENT_AMBULATORY_CARE_PROVIDER_SITE_OTHER): Payer: 59 | Admitting: Psychiatry

## 2017-05-27 VITALS — BP 179/95 | HR 91 | Temp 97.6°F | Wt 234.4 lb

## 2017-05-27 DIAGNOSIS — F339 Major depressive disorder, recurrent, unspecified: Secondary | ICD-10-CM

## 2017-05-27 DIAGNOSIS — F4001 Agoraphobia with panic disorder: Secondary | ICD-10-CM | POA: Diagnosis not present

## 2017-05-27 MED ORDER — MIRTAZAPINE 15 MG PO TABS: 15.0000 mg | ORAL_TABLET | Freq: Every day | ORAL | 1 refills | Status: DC

## 2017-05-27 MED ORDER — CLONAZEPAM 0.5 MG PO TABS: 0.5000 mg | ORAL_TABLET | Freq: Two times a day (BID) | ORAL | 4 refills | Status: DC | PRN

## 2017-05-27 MED ORDER — DULOXETINE HCL 60 MG PO CPEP: 60.0000 mg | ORAL_CAPSULE | Freq: Two times a day (BID) | ORAL | 1 refills | Status: DC

## 2017-05-27 NOTE — Progress Notes (Signed)
BH MD/PA/NP OP Progress Note  05/27/2017 10:33 AM Derek Cooley.  MRN:  [id number]  Subjective:  Patient is a 51 year old male who presented for follow-up appointment accompanied by his son. He reported that he has been doing well on his medications. He reported that he does not have any anxiety and his memory is also improving. Patient reported that he sleeps well at night. His blood pressure remains elevated. He stated that he takes his medications at different timings during the day and he just took his blood pressure medication before coming for this appointment. His son was also concerned about his blood pressure. We discussed about taking the medication at consistent times. Patient reported that he has also lost 5 pounds as he has been trying to lose weight. He denied having any suicidal homicidal ideations or plans. He remains pleasant and cooperative during the interview.    Advised patient to start exercise on a regular basis and start doing relaxation therapy.   Chief Complaint:  Chief Complaint    Follow-up; Medication Refill     Visit Diagnosis:     ICD-10-CM   1. Major depression, recurrent, chronic (HCC) F33.9   2. Panic disorder with agoraphobia and moderate panic attacks F40.01     Past Medical History:  Past Medical History:  Diagnosis Date  . Abdominal abscess   . Acute pancreatitis   . Anxiety   . Asthma   . Back pain   . Depression   . Diabetes mellitus without complication ([place])   . Emphysema of lung ([place])    right sided  . GERD (gastroesophageal reflux disease)   . Hemorrhage into subarachnoid space of neuraxis ([place]) 01/26/2010  . Hypertension   . Mild cognitive impairment   . MRSA (methicillin resistant Staphylococcus aureus)   . Obesity   . Panic disorder   . Perforated bowel ([place]) 2007   Tempoary Colostomy Bag, Skin Graft for Abd wound  . Sleep apnea    sleep study 2013  . Subarachnoid hemorrhage ([place]) 2011   coil placed  . Type 2 diabetes  mellitus without complication, without long-term current use of insulin ([place]) 04/17/2016  . Vitreous hemorrhage ([place]) 03/27/2011   Overview:  Bilateral; 01/2010, from [facility]     Past Surgical History:  Procedure Laterality Date  . COLON SURGERY  2007   colostomy bag placed s/p perforated bowel  . KNEE SURGERY Right   . Perforated bowel     Family History:  Family History  Problem Relation Age of Onset  . Arthritis Mother   . Asthma Mother   . Diabetes Mother   . Heart disease Mother   . Hyperlipidemia Mother   . Hypertension Mother   . Kidney disease Mother   . Thyroid disease Mother   . Lung disease Mother   . Anxiety disorder Mother   . Depression Mother   . Diabetes Father   . Heart disease Father   . Depression Father   . Anxiety disorder Father   . Arthritis Sister   . Asthma Sister   . Hyperlipidemia Sister   . Hypertension Sister   . Lung disease Sister   . Anxiety disorder Sister   . Depression Sister   . Hyperlipidemia Brother   . Hypertension Brother   . Diabetes Sister   . Heart disease Sister   . Depression Sister   . Anxiety disorder Sister   . Anxiety disorder Brother   . Depression Brother   . Heart disease  Brother    Social History:  Social History   Social History  . Marital status: Divorced    Spouse name: N/A  . Number of children: N/A  . Years of education: N/A   Social History Main Topics  . Smoking status: Current Every Day Smoker    Packs/day: 1.50    Types: Cigarettes    Start date: 10/11/1984  . Smokeless tobacco: Never Used  . Alcohol use 0.0 oz/week     Comment: Rarely  . Drug use: No  . Sexual activity: Not Currently   Other Topics Concern  . None   Social History Narrative  . None   Additional History:   Assessment:   Musculoskeletal: Strength & Muscle Tone: within normal limits Gait & Station: normal Patient leans: N/A  Psychiatric Specialty Exam: Medication Refill  Associated symptoms include abdominal pain,  headaches and myalgias.    Review of Systems  Constitutional: Positive for malaise/fatigue.  Gastrointestinal: Positive for abdominal pain and diarrhea.  Musculoskeletal: Positive for joint pain and myalgias.  Neurological: Positive for headaches.  Psychiatric/Behavioral: Positive for depression. Negative for hallucinations, memory loss, substance abuse and suicidal ideas. The patient is nervous/anxious and has insomnia.   All other systems reviewed and are negative.   Blood pressure (!) 179/95, pulse 91, temperature 97.6 F (36.4 C), temperature source Oral, weight 234 lb 6.4 oz (106.3 kg).Body mass index is 36.71 kg/m.  General Appearance: Neat and Well Groomed  Eye Contact:  Good  Speech:  Normal Rate  Volume:  Normal  Mood:  Anxious and good  Affect:  Appropriate  Thought Process:  Linear and Logical  Orientation:  Full (Time, Place, and Person)  Thought Content:  Negative  Suicidal Thoughts:  No  Homicidal Thoughts:  No  Memory:  Immediate;   Good Recent;   Good Remote;   Good  Judgement:  Good  Insight:  Good  Psychomotor Activity:  Negative  Concentration:  Good  Recall:  Good  Fund of Knowledge: Good  Language: Good  Akathisia:  Negative  Handed:    AIMS (if indicated):    Assets:  Communication Skills Desire for Improvement Social Support  ADL's:  Intact  Cognition: WNL  Sleep: fair   Is the patient at risk to self?  No. Has the patient been a risk to self in the past 6 months?  No. Has the patient been a risk to self within the distant past?  No. Is the patient a risk to others?  No. Has the patient been a risk to others in the past 6 months?  No. Has the patient been a risk to others within the distant past?  No.  Current Medications: Current Outpatient Prescriptions  Medication Sig Dispense Refill  . albuterol (PROVENTIL HFA;VENTOLIN HFA) 108 (90 Base) MCG/ACT inhaler Inhale 1-2 puffs into the lungs every 6 (six) hours as needed for wheezing or  shortness of breath. 18 g 3  . amLODipine (NORVASC) 10 MG tablet Take 1 tablet (10 mg total) by mouth daily. 90 tablet 3  . aspirin EC 81 MG tablet Take 81 mg by mouth daily.    . budesonide-formoterol (SYMBICORT) 160-4.5 MCG/ACT inhaler Inhale 2 puffs into the lungs 2 (two) times daily. 1 Inhaler 12  . clonazePAM (KLONOPIN) 0.5 MG tablet Take 1 tablet (0.5 mg total) by mouth 2 (two) times daily as needed for anxiety. 60 tablet 4  . doxycycline (VIBRAMYCIN) 100 MG capsule Take 100 mg by mouth 2 (two) times daily.    Marland Kitchen  DULoxetine (CYMBALTA) 60 MG capsule Take 1 capsule (60 mg total) by mouth 2 (two) times daily. 180 capsule 1  . fluticasone (FLONASE) 50 MCG/ACT nasal spray Place 2 sprays into both nostrils daily. 16 g 12  . Fluticasone-Salmeterol (ADVAIR DISKUS) 250-50 MCG/DOSE AEPB Frequency:BID   Dosage:0.0     Instructions:  Note:Dose: 1 PUFF 60 each 6  . lisinopril (PRINIVIL,ZESTRIL) 40 MG tablet Take 1 tablet (40 mg total) by mouth daily. 30 tablet 3  . metoprolol succinate (TOPROL-XL) 50 MG 24 hr tablet Take 1 tablet (50 mg total) by mouth daily. Take with or immediately following a meal. 30 tablet 3  . mirtazapine (REMERON) 15 MG tablet Take 1 tablet (15 mg total) by mouth at bedtime. 90 tablet 1  . omeprazole (PRILOSEC) 40 MG capsule take 1 capsule by mouth once daily 90 capsule 1  . tiZANidine (ZANAFLEX) 2 MG tablet Take 2 mg by mouth every 6 (six) hours as needed.    . topiramate (TOPAMAX) 50 MG tablet Take 50 mg by mouth 2 (two) times daily.     No current facility-administered medications for this visit.     Medical Decision Making:  Established Problem, Stable/Improving (1), Review of Medication Regimen & Side Effects (2) and Review of New Medication or Change in Dosage (2)  Treatment Plan Summary:Medication management   Discussed with patient about the medications treatment risk benefits and alternatives Cymbalta 60 mg BID continue Remeron 15 mg by mouth daily at bedtime for his  depression and he agreed with the plan. Continue Klonopin 0.5 mg by mouth twice a day when necessary for his anxiety His  sister continues to monitor his medications and she agreed with the plan.   Follow-up in 3 months or earlier depending on his symptoms and he agreed with the plan.   More than 50% of the time spent in psychoeducation, counseling and coordination of care.    This note was generated in part or whole with voice recognition software. Voice regonition is usually quite accurate but there are transcription errors that can and very often do occur. I apologize for any typographical errors that were not detected and corrected.   Rainey Pines, MD  05/27/2017, 10:33 AM

## 2017-08-13 ENCOUNTER — Ambulatory Visit (INDEPENDENT_AMBULATORY_CARE_PROVIDER_SITE_OTHER): Payer: Medicare Other | Admitting: Family Medicine

## 2017-08-13 ENCOUNTER — Other Ambulatory Visit: Payer: Self-pay | Admitting: Family Medicine

## 2017-08-13 ENCOUNTER — Encounter: Payer: Self-pay | Admitting: Family Medicine

## 2017-08-13 VITALS — BP 183/118 | HR 71 | Temp 98.6°F | Wt 234.0 lb

## 2017-08-13 DIAGNOSIS — J069 Acute upper respiratory infection, unspecified: Secondary | ICD-10-CM

## 2017-08-13 DIAGNOSIS — I129 Hypertensive chronic kidney disease with stage 1 through stage 4 chronic kidney disease, or unspecified chronic kidney disease: Secondary | ICD-10-CM | POA: Diagnosis not present

## 2017-08-13 DIAGNOSIS — E119 Type 2 diabetes mellitus without complications: Secondary | ICD-10-CM

## 2017-08-13 DIAGNOSIS — E7800 Pure hypercholesterolemia, unspecified: Secondary | ICD-10-CM

## 2017-08-13 MED ORDER — BENZONATATE 200 MG PO CAPS: 200.0000 mg | ORAL_CAPSULE | Freq: Three times a day (TID) | ORAL | 0 refills | Status: DC | PRN

## 2017-08-13 MED ORDER — PREDNISONE 10 MG PO TABS: ORAL_TABLET | ORAL | 0 refills | Status: DC

## 2017-08-13 MED ORDER — AZITHROMYCIN 250 MG PO TABS: ORAL_TABLET | ORAL | 0 refills | Status: DC

## 2017-08-13 MED ORDER — GUAIFENESIN-CODEINE 100-10 MG/5ML PO SOLN: 10.0000 mL | Freq: Three times a day (TID) | ORAL | 0 refills | Status: DC | PRN

## 2017-08-13 NOTE — Progress Notes (Signed)
BP (!) 183/118   Pulse 71   Temp 98.6 F (37 C)   Wt 234 lb (106.1 kg)   SpO2 96%   BMI 36.65 kg/m    Subjective:    Patient ID: Derek Cooley., male    DOB: [birth year], 51 y.o.   MRN: [id number]  HPI: Derek Cooley. is a 51 y.o. male  Chief Complaint  Patient presents with  . Cough    x 2 weeks, productive cough, chest/head congestion, occ sore throat, nasal congestion. No fever, no ear ache. Has been working out in a hog pen and wonders if he could have picked up something.  Marland Kitchen Hypertension    started checking it yesterday 200/97 and was 211/117 after he took his medicine   Patient presents for 2 weeks of worsening productive cough, congestion, sore throat, chest tightness. Denies known fever, chills, CP. Has not been taking anything for sxs at this time. Hx of COPD, smoking.   Also concerned about recent elevated BPs. Was having a panic attack yesterday and BP at home was in the 200-210 /90s-117 range. Has not been regularly checking. Unsure about medication compliance, his typical caregiver that manages medications is out from surgery so he isn't sure he's taking everything he's supposed to be. Denies CP, dizziness, syncope. Recently saw the Nephrologist for difficult to control BPs, who found BPs to be in range with current regimen.  Also overdue for some chronic illness screening labs for his high triglycerides and DM. Will check those today.   Relevant past medical, surgical, family and social history reviewed and updated as indicated. Interim medical history since our last visit reviewed. Allergies and medications reviewed and updated.  Review of Systems  Constitutional: Negative.   HENT: Positive for congestion and sore throat.   Respiratory: Positive for cough and chest tightness.   Cardiovascular: Negative.   Gastrointestinal: Negative.   Musculoskeletal: Negative.   Neurological: Negative.   Psychiatric/Behavioral: Negative.     Per HPI unless  specifically indicated above     Objective:    BP (!) 183/118   Pulse 71   Temp 98.6 F (37 C)   Wt 234 lb (106.1 kg)   SpO2 96%   BMI 36.65 kg/m   Wt Readings from Last 3 Encounters:  08/13/17 234 lb (106.1 kg)  02/11/17 241 lb (109.3 kg)  11/12/16 239 lb (108.4 kg)    Physical Exam  Constitutional: He appears well-developed and well-nourished. No distress.  HENT:  Head: Atraumatic.  Right Ear: External ear normal.  Left Ear: External ear normal.  Nose: Nose normal.  Oropharynx erythematous  Eyes: Pupils are equal, round, and reactive to light. Conjunctivae are normal.  Neck: Normal range of motion. Neck supple.  Cardiovascular: Normal rate and normal heart sounds.   Pulmonary/Chest: Effort normal. No respiratory distress. He has wheezes.  Musculoskeletal: Normal range of motion.  Lymphadenopathy:    He has no cervical adenopathy.  Neurological: He is alert.  Skin: Skin is warm and dry.  Psychiatric: He has a normal mood and affect. His behavior is normal.  Nursing note and vitals reviewed.     Assessment & Plan:   Problem List Items Addressed This Visit      Endocrine   RESOLVED: Type 2 diabetes mellitus without complication, without long-term current use of insulin ([place])    Will recheck A1C today, continue current regimen. Work on lifestyle modifications      Relevant Orders   HgB A1c (  Completed)     Genitourinary   Benign hypertensive renal disease    BPs significantly elevated. Printed out detailed instruction sheet with his medications and other family member will make sure he's getting what he needs. Will recheck in several weeks once he's feeling better from current URI        Other   High blood cholesterol    Await lab results. Discussed lifestyle modifications      Relevant Orders   Comprehensive metabolic panel (Completed)   Lipid Panel w/o Chol/HDL Ratio (Completed)    Other Visit Diagnoses    Upper respiratory tract infection,  unspecified type    -  Primary   Pt opting to hold off on x-ray for now, treat with zpack, tessalon, robitussin AC, prednisone taper, and inhaler regimen. X-ray if no improvement   Relevant Medications   azithromycin (ZITHROMAX) 250 MG tablet       Follow up plan: Return in about 2 weeks (around 08/27/2017) for Lung check, BP f/u.

## 2017-08-14 ENCOUNTER — Telehealth: Payer: Self-pay | Admitting: Family Medicine

## 2017-08-14 LAB — COMPREHENSIVE METABOLIC PANEL
ALK PHOS: 111 IU/L (ref 39–117)
ALT: 16 IU/L (ref 0–44)
AST: 24 IU/L (ref 0–40)
Albumin/Globulin Ratio: 1.6 (ref 1.2–2.2)
Albumin: 4.1 g/dL (ref 3.5–5.5)
BUN/Creatinine Ratio: 11 (ref 9–20)
BUN: 23 mg/dL (ref 6–24)
Bilirubin Total: 0.3 mg/dL (ref 0.0–1.2)
CALCIUM: 9 mg/dL (ref 8.7–10.2)
CO2: 25 mmol/L (ref 20–29)
CREATININE: 2.03 mg/dL — AB (ref 0.76–1.27)
Chloride: 100 mmol/L (ref 96–106)
GFR calc Af Amer: 43 mL/min/{1.73_m2} — ABNORMAL LOW (ref >59)
GFR, EST NON AFRICAN AMERICAN: 37 mL/min/{1.73_m2} — AB (ref >59)
GLUCOSE: 139 mg/dL — AB (ref 65–99)
Globulin, Total: 2.5 g/dL (ref 1.5–4.5)
Potassium: 4.1 mmol/L (ref 3.5–5.2)
SODIUM: 139 mmol/L (ref 134–144)
Total Protein: 6.6 g/dL (ref 6.0–8.5)

## 2017-08-14 LAB — HEMOGLOBIN A1C
Est. average glucose Bld gHb Est-mCnc: 126 mg/dL
Hgb A1c MFr Bld: 6 % — ABNORMAL HIGH (ref 4.8–5.6)

## 2017-08-14 LAB — LIPID PANEL W/O CHOL/HDL RATIO
CHOLESTEROL TOTAL: 192 mg/dL (ref 100–199)
HDL: 30 mg/dL — AB (ref >39)
TRIGLYCERIDES: 427 mg/dL — AB (ref 0–149)

## 2017-08-14 NOTE — Telephone Encounter (Signed)
Patient's sister notified, HIPPA ok.

## 2017-08-14 NOTE — Telephone Encounter (Signed)
Please call pt and let him know that his labs are fairly stable other than a little bit of decrease in his kidney function and still elevated triglycerides. Let's add a fish oil supplement daily. We can discuss cholesterol medication when he comes back in 2 or 3 weeks to recheck his BP and his lungs

## 2017-08-15 NOTE — Assessment & Plan Note (Signed)
Will recheck A1C today, continue current regimen. Work on lifestyle modifications

## 2017-08-15 NOTE — Assessment & Plan Note (Signed)
BPs significantly elevated. Printed out detailed instruction sheet with his medications and other family member will make sure he's getting what he needs. Will recheck in several weeks once he's feeling better from current URI

## 2017-08-15 NOTE — Patient Instructions (Signed)
Follow-up in 2 weeks

## 2017-08-15 NOTE — Assessment & Plan Note (Signed)
Await lab results. Discussed lifestyle modifications

## 2017-08-21 ENCOUNTER — Ambulatory Visit: Payer: Medicare Other

## 2017-08-21 DIAGNOSIS — S92351A Displaced fracture of fifth metatarsal bone, right foot, initial encounter for closed fracture: Secondary | ICD-10-CM | POA: Diagnosis not present

## 2017-08-26 ENCOUNTER — Ambulatory Visit: Payer: Medicare Other | Admitting: Psychiatry

## 2017-09-10 ENCOUNTER — Telehealth: Payer: Self-pay | Admitting: Family Medicine

## 2017-09-10 NOTE — Telephone Encounter (Signed)
Spoke to Derek Cooley she will relay message to Mr. Marina to c/b to schedule appt for AWV

## 2017-09-17 ENCOUNTER — Ambulatory Visit (INDEPENDENT_AMBULATORY_CARE_PROVIDER_SITE_OTHER): Payer: Medicare Other | Admitting: Family Medicine

## 2017-09-17 ENCOUNTER — Encounter: Payer: Self-pay | Admitting: Family Medicine

## 2017-09-17 VITALS — BP 178/101 | HR 71 | Temp 98.1°F | Wt 239.3 lb

## 2017-09-17 DIAGNOSIS — J441 Chronic obstructive pulmonary disease with (acute) exacerbation: Secondary | ICD-10-CM | POA: Diagnosis not present

## 2017-09-17 DIAGNOSIS — I129 Hypertensive chronic kidney disease with stage 1 through stage 4 chronic kidney disease, or unspecified chronic kidney disease: Secondary | ICD-10-CM | POA: Diagnosis not present

## 2017-09-17 MED ORDER — AMLODIPINE BESYLATE 10 MG PO TABS: 10.0000 mg | ORAL_TABLET | Freq: Every day | ORAL | 3 refills | Status: DC

## 2017-09-17 MED ORDER — PREDNISONE 10 MG PO TABS
ORAL_TABLET | ORAL | 0 refills | Status: DC
Start: 2017-09-17 — End: 2017-10-09

## 2017-09-17 MED ORDER — AZITHROMYCIN 250 MG PO TABS: ORAL_TABLET | ORAL | 0 refills | Status: DC

## 2017-09-17 NOTE — Progress Notes (Signed)
BP (!) 178/101 (BP Location: Left Arm, Patient Position: Sitting, Cuff Size: Normal)   Pulse 71   Temp 98.1 F (36.7 C)   Wt 239 lb 4.8 oz (108.5 kg)   SpO2 100%   BMI 37.48 kg/m    Subjective:    Patient ID: Derek Cooley., male    DOB: 1966-11-22, 51 y.o.   MRN: [id number]  HPI: Derek Cooley. is a 51 y.o. male  Chief Complaint  Patient presents with  . Cough    x's 1 week. Patient feels like there's fluid in his lungs. Productive cough.  . Edema   Patient presents with 1 week of productive cough, chest tightness, wheezing. Denies fever, chills, sweats, CP, SOB. Long hx of COPD, trying to cut back on smoking but still smoking at least 1 ppd. Compliant with inhaler regimen. No sick contacts.   Also unclear on his BP regimen. Not taking metoprolol but taking the other two currently. Denies CP, blurred vision, syncope.   Past Medical History:  Diagnosis Date  . Abdominal abscess   . Acute pancreatitis   . Anxiety   . Asthma   . Back pain   . Depression   . Diabetes mellitus without complication ([place])   . Emphysema of lung ([place])    right sided  . GERD (gastroesophageal reflux disease)   . 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 Hypertension   . Mild cognitive impairment   . MRSA (methicillin resistant Staphylococcus aureus)   . Obesity   . Panic disorder   . Perforated bowel ([place]) 2007   Tempoary Colostomy Bag, Skin Graft for Abd wound  . Sleep apnea    sleep study 2013  . Subarachnoid hemorrhage ([place]) 2011   coil placed  . Type 2 diabetes mellitus without complication, without long-term current use of insulin ([place]) 04/17/2016  . Vitreous hemorrhage ([place]) 03/27/2011   Overview:  Bilateral; 01/2010, from [facility]    Social History   Social History  . Marital status: Divorced    Spouse name: N/A  . Number of children: N/A  . Years of education: N/A   Occupational History  . Not on file.   Social History Main Topics  . Smoking  status: Current Every Day Smoker    Packs/day: 2.00    Types: Cigarettes    Start date: 10/11/1984  . Smokeless tobacco: Never Used  . Alcohol use 0.0 oz/week     Comment: Rarely  . Drug use: No  . Sexual activity: Not Currently   Other Topics Concern  . Not on file   Social History Narrative  . No narrative on file    Relevant past medical, surgical, family and social history reviewed and updated as indicated. Interim medical history since our last visit reviewed. Allergies and medications reviewed and updated.  Review of Systems  Constitutional: Negative.   HENT: Positive for congestion.   Eyes: Negative.   Respiratory: Positive for cough, chest tightness and wheezing.   Gastrointestinal: Negative.   Musculoskeletal: Negative.   Neurological: Negative.   Psychiatric/Behavioral: Negative.    Per HPI unless specifically indicated above     Objective:    BP (!) 178/101 (BP Location: Left Arm, Patient Position: Sitting, Cuff Size: Normal)   Pulse 71   Temp 98.1 F (36.7 C)   Wt 239 lb 4.8 oz (108.5 kg)   SpO2 100%   BMI 37.48 kg/m   Wt Readings from Last 3 Encounters:  09/17/17 239 lb 4.8 oz (108.5 kg)  08/13/17 234 lb (106.1 kg)  02/11/17 241 lb (109.3 kg)    Physical Exam  Constitutional: He is oriented to person, place, and time. He appears well-developed and well-nourished. No distress.  HENT:  Head: Atraumatic.  Eyes: Pupils are equal, round, and reactive to light. Conjunctivae are normal. No scleral icterus.  Neck: Normal range of motion. Neck supple.  Cardiovascular: Normal rate and normal heart sounds.   Pulmonary/Chest: Effort normal and breath sounds normal. No respiratory distress.  Musculoskeletal: Normal range of motion.  Neurological: He is alert and oriented to person, place, and time.  Skin: Skin is warm and dry.  Psychiatric: He has a normal mood and affect. His behavior is normal.  Nursing note and vitals reviewed.     Assessment & Plan:     Problem List Items Addressed This Visit      Respiratory   COPD (chronic obstructive pulmonary disease) ([place]) - Primary    Will treat with zpack, prednisone, continued inhaler regimen. Continue working on smoking cessation. F/u if worsening or no improvement      Relevant Medications   azithromycin (ZITHROMAX) 250 MG tablet   predniSONE (DELTASONE) 10 MG tablet     Genitourinary   Benign hypertensive renal disease    Bp not at goal today, will restart metoprolol and pt will call office if not WNL once back on medications. Reduced salt diet, increase exercise as tolerated          Follow up plan: Return in about 5 months (around 02/15/2018) for BP, DM.

## 2017-09-19 NOTE — Assessment & Plan Note (Signed)
Bp not at goal today, will restart metoprolol and pt will call office if not WNL once back on medications. Reduced salt diet, increase exercise as tolerated

## 2017-09-19 NOTE — Patient Instructions (Signed)
Follow up in 6 months 

## 2017-09-19 NOTE — Assessment & Plan Note (Signed)
Will treat with zpack, prednisone, continued inhaler regimen. Continue working on smoking cessation. F/u if worsening or no improvement

## 2017-10-08 ENCOUNTER — Other Ambulatory Visit: Payer: Self-pay

## 2017-10-08 NOTE — Telephone Encounter (Signed)
Routing to provider. Would like a 90 day supply.  Has AWV scheduled for 10/09/17, follow up scheduled for 02/17/18.

## 2017-10-09 ENCOUNTER — Ambulatory Visit (INDEPENDENT_AMBULATORY_CARE_PROVIDER_SITE_OTHER): Payer: Medicare Other

## 2017-10-09 VITALS — BP 188/110 | HR 98 | Temp 98.5°F | Ht 63.0 in | Wt 242.7 lb

## 2017-10-09 DIAGNOSIS — Z23 Encounter for immunization: Secondary | ICD-10-CM

## 2017-10-09 DIAGNOSIS — Z0000 Encounter for general adult medical examination without abnormal findings: Secondary | ICD-10-CM | POA: Diagnosis not present

## 2017-10-09 MED ORDER — OMEPRAZOLE 40 MG PO CPDR: 40.0000 mg | DELAYED_RELEASE_CAPSULE | Freq: Every day | ORAL | 1 refills | Status: DC

## 2017-10-09 NOTE — Progress Notes (Signed)
Subjective:   Derek Cooley. is a 51 y.o. male who presents for Medicare Annual/Subsequent preventive examination.  Review of Systems:  Cardiac Risk Factors include: male gender;advanced age (>54men, >40 women);hypertension;obesity (BMI >30kg/m2)     Objective:    Vitals: BP (!) 188/110 (BP Location: Right Arm)   Pulse 98   Temp 98.5 F (36.9 C) (Oral)   Ht 5\' 3"  (1.6 m)   Wt 242 lb 11.2 oz (110.1 kg)   BMI 42.99 kg/m   Body mass index is 42.99 kg/m.  Tobacco History  Smoking Status  . Current Every Day Smoker  . Packs/day: 2.00  . Types: Cigarettes  . Start date: 10/11/1984  Smokeless Tobacco  . Never Used     Ready to quit: Yes Counseling given: Yes   Past Medical History:  Diagnosis Date  . Abdominal abscess   . Acute pancreatitis   . Anxiety   . Asthma   . Back pain   . Depression   . Diabetes mellitus without complication ([place])   . Emphysema of lung ([place])    right sided  . GERD (gastroesophageal reflux disease)   . Hemorrhage into subarachnoid space of neuraxis ([place]) 01/26/2010  . Hypertension   . Mild cognitive impairment   . MRSA (methicillin resistant Staphylococcus aureus)   . Obesity   . Panic disorder   . Perforated bowel ([place]) 2007   Tempoary Colostomy Bag, Skin Graft for Abd wound  . Sleep apnea    sleep study 2013  . Subarachnoid hemorrhage ([place]) 2011   coil placed  . Type 2 diabetes mellitus without complication, without long-term current use of insulin ([place]) 04/17/2016  . Vitreous hemorrhage ([place]) 03/27/2011   Overview:  Bilateral; 01/2010, from [facility]    Past Surgical History:  Procedure Laterality Date  . COLON SURGERY  2007   colostomy bag placed s/p perforated bowel  . KNEE SURGERY Right   . Perforated bowel     Family History  Problem Relation Age of Onset  . Arthritis Mother   . Asthma Mother   . Diabetes Mother   . Heart disease Mother   . Hyperlipidemia Mother   . Hypertension Mother   . Kidney disease Mother   .  Thyroid disease Mother   . Lung disease Mother   . Anxiety disorder Mother   . Depression Mother   . Diabetes Father   . Heart disease Father   . Depression Father   . Anxiety disorder Father   . Arthritis Sister   . Asthma Sister   . Hyperlipidemia Sister   . Hypertension Sister   . Lung disease Sister   . Anxiety disorder Sister   . Depression Sister   . Hyperlipidemia Brother   . Hypertension Brother   . Diabetes Sister   . Heart disease Sister   . Depression Sister   . Anxiety disorder Sister   . Anxiety disorder Brother   . Depression Brother   . Heart disease Brother    History  Sexual Activity  . Sexual activity: Not Currently    Outpatient Encounter Prescriptions as of 10/09/2017  Medication Sig  . albuterol (PROVENTIL HFA;VENTOLIN HFA) 108 (90 Base) MCG/ACT inhaler Inhale 1-2 puffs into the lungs every 6 (six) hours as needed for wheezing or shortness of breath.  Marland Kitchen amLODipine (NORVASC) 10 MG tablet Take 1 tablet (10 mg total) by mouth daily.  Marland Kitchen aspirin EC 81 MG tablet Take 81 mg by mouth daily.  Marland Kitchen  azithromycin (ZITHROMAX) 250 MG tablet Take 2 tabs day one, then 1 tab daily until complete  . budesonide-formoterol (SYMBICORT) 160-4.5 MCG/ACT inhaler Inhale 2 puffs into the lungs 2 (two) times daily.  . clonazePAM (KLONOPIN) 0.5 MG tablet Take 1 tablet (0.5 mg total) by mouth 2 (two) times daily as needed for anxiety.  Marland Kitchen doxycycline (VIBRAMYCIN) 100 MG capsule Take 100 mg by mouth 2 (two) times daily.  . DULoxetine (CYMBALTA) 60 MG capsule Take 1 capsule (60 mg total) by mouth 2 (two) times daily.  Marland Kitchen lisinopril (PRINIVIL,ZESTRIL) 40 MG tablet Take 1 tablet (40 mg total) by mouth daily.  . metoprolol succinate (TOPROL-XL) 50 MG 24 hr tablet Take 1 tablet (50 mg total) by mouth daily. Take with or immediately following a meal.  . mirtazapine (REMERON) 15 MG tablet Take 1 tablet (15 mg total) by mouth at bedtime.  Marland Kitchen omeprazole (PRILOSEC) 40 MG capsule Take 1 capsule (40  mg total) by mouth daily.  Marland Kitchen tiZANidine (ZANAFLEX) 4 MG tablet Take 4 mg by mouth.  . topiramate (TOPAMAX) 50 MG tablet Take 50 mg by mouth 2 (two) times daily.  . fluticasone (FLONASE) 50 MCG/ACT nasal spray Place 2 sprays into both nostrils daily. (Patient not taking: Reported on 10/09/2017)  . [DISCONTINUED] predniSONE (DELTASONE) 10 MG tablet Take 6 tabs day one, then 5 tabs day two, 4 tabs day three, etc (Patient not taking: Reported on 10/09/2017)   No facility-administered encounter medications on file as of 10/09/2017.     Activities of Daily Living In your present state of health, do you have any difficulty performing the following activities: 10/09/2017  Hearing? N  Vision? Y  Difficulty concentrating or making decisions? Y  Comment previous brain aneurysm   Walking or climbing stairs? Y  Dressing or bathing? N  Doing errands, shopping? N  Preparing Food and eating ? N  Using the Toilet? N  In the past six months, have you accidently leaked urine? N  Do you have problems with loss of bowel control? N  Managing your Medications? N  Managing your Finances? N  Housekeeping or managing your Housekeeping? N  Some recent data might be hidden    Patient Care Team: Volney American, PA-C as PCP - General (Family Medicine)   Assessment:    Exercise Activities and Dietary recommendations Current Exercise Habits: The patient does not participate in regular exercise at present, Exercise limited by: orthopedic condition(s)  Goals    . Quit smoking / using tobacco          Smoking cessation discussed       Fall Risk Fall Risk  10/09/2017  Falls in the past year? Yes  Number falls in past yr: 1  Comment fell down attic  Injury with Fall? Yes  Follow up Education provided   Depression Screen PHQ 2/9 Scores 10/09/2017  PHQ - 2 Score 1    Cognitive Function     6CIT Screen 10/09/2017  What Year? 0 points  What month? 0 points  What time? 0 points  Count  back from 20 0 points  Months in reverse 0 points  Repeat phrase 10 points  Total Score 10    Immunization History  Administered Date(s) Administered  . Influenza,inj,Quad PF,6+ Mos 10/09/2017  . Influenza-Unspecified 09/20/2014, 08/25/2015  . Pneumococcal Polysaccharide-23 09/03/2011, 10/06/2012, 08/15/2015  . Tdap 06/04/2016   Screening Tests Health Maintenance  Topic Date Due  . FOOT EXAM  11/26/1975  . OPHTHALMOLOGY EXAM  11/26/1975  .  COLONOSCOPY  11/26/2015  . PNEUMOCOCCAL POLYSACCHARIDE VACCINE (2) 09/02/2016  . HEMOGLOBIN A1C  02/10/2018  . TETANUS/TDAP  06/04/2026  . INFLUENZA VACCINE  Addressed  . HIV Screening  Completed      Plan:    I have personally reviewed and addressed the Medicare Annual Wellness questionnaire and have noted the following in the patient's chart:  A. Medical and social history B. Use of alcohol, tobacco or illicit drugs  C. Current medications and supplements D. Functional ability and status E.  Nutritional status F.  Physical activity G. Advance directives H. List of other physicians I.  Hospitalizations, surgeries, and ER visits in previous 12 months J.  [place] such as hearing and vision if needed, cognitive and depression L. Referrals and appointments   In addition, I have reviewed and discussed with patient certain preventive protocols, quality metrics, and best practice recommendations. A written personalized care plan for preventive services as well as general preventive health recommendations were provided to patient.   Signed,  Tyler Aas, LPN Nurse Health Advisor   MD Recommendations: Patients BP 188/110 patient states he hasnt taken BP meds today. Discussed with Merrie Roof, PA patient to take his blood pressure medication today and recheck his BP and advised to cut back on smoking. Medication compliance discussed with patient.   Patient complains of 10/10 pain in stomach right hip and leg. States he fell  from his attic 3 weeks ago. Did not go to ED or visit PCP office at that time. Patient advised to make a follow up appt with Wynona Dove.   due for diabetic foot exam.

## 2017-10-09 NOTE — Patient Instructions (Signed)
[name] , Thank you for taking time to come for your Medicare Wellness Visit. I appreciate your ongoing commitment to your health goals. Please review the following plan we discussed and let me know if I can assist you in the future.   Screening recommendations/referrals: Colonoscopy: due now Recommended yearly ophthalmology/optometry visit for glaucoma screening and checkup Recommended yearly dental visit for hygiene and checkup  Vaccinations: Influenza vaccine: due now Pneumococcal vaccine: due at age 40 Tdap vaccine: up to date Shingles vaccine: Due, check with your insurance company for coverage  Advanced directives: Advance directive discussed with you today. Even though you declined this today please call our office should you change your mind and we can give you the proper paperwork for you to fill out.  Conditions/risks identified: Smoking cessation discussed   Next appointment: Follow up in one year for your annual wellness exam.   Preventive Care 40-64 Years, Male Preventive care refers to lifestyle choices and visits with your health care provider that can promote health and wellness. What does preventive care include?  A yearly physical exam. This is also called an annual well check.  Dental exams once or twice a year.  Routine eye exams. Ask your health care provider how often you should have your eyes checked.  Personal lifestyle choices, including:  Daily care of your teeth and gums.  Regular physical activity.  Eating a healthy diet.  Avoiding tobacco and drug use.  Limiting alcohol use.  Practicing safe sex.  Taking low-dose aspirin every day starting at age 12. What happens during an annual well check? The services and screenings done by your health care provider during your annual well check will depend on your age, overall health, lifestyle risk factors, and family history of disease. Counseling  Your health care provider may ask you questions  about your:  Alcohol use.  Tobacco use.  Drug use.  Emotional well-being.  Home and relationship well-being.  Sexual activity.  Eating habits.  Work and work Statistician. Screening  You may have the following tests or measurements:  Height, weight, and BMI.  Blood pressure.  Lipid and cholesterol levels. These may be checked every 5 years, or more frequently if you are over 41 years old.  Skin check.  Lung cancer screening. You may have this screening every year starting at age 3 if you have a 30-pack-year history of smoking and currently smoke or have quit within the past 15 years.  Fecal occult blood test (FOBT) of the stool. You may have this test every year starting at age 97.  Flexible sigmoidoscopy or colonoscopy. You may have a sigmoidoscopy every 5 years or a colonoscopy every 10 years starting at age 32.  Prostate cancer screening. Recommendations will vary depending on your family history and other risks.  Hepatitis C blood test.  Hepatitis B blood test.  Sexually transmitted disease (STD) testing.  Diabetes screening. This is done by checking your blood sugar (glucose) after you have not eaten for a while (fasting). You may have this done every 1-3 years. Discuss your test results, treatment options, and if necessary, the need for more tests with your health care provider. Vaccines  Your health care provider may recommend certain vaccines, such as:  Influenza vaccine. This is recommended every year.  Tetanus, diphtheria, and acellular pertussis (Tdap, Td) vaccine. You may need a Td booster every 10 years.  Zoster vaccine. You may need this after age 51.  Pneumococcal 13-valent conjugate (PCV13) vaccine. You may need  this if you have certain conditions and have not been vaccinated.  Pneumococcal polysaccharide (PPSV23) vaccine. You may need one or two doses if you smoke cigarettes or if you have certain conditions. Talk to your health care provider  about which screenings and vaccines you need and how often you need them. This information is not intended to replace advice given to you by your health care provider. Make sure you discuss any questions you have with your health care provider. Document Released: 12/23/2015 Document Revised: 08/15/2016 Document Reviewed: 09/27/2015 Elsevier Interactive Patient Education  2017 [place] Prevention in the Home Falls can cause injuries. They can happen to people of all ages. There are many things you can do to make your home safe and to help prevent falls. What can I do on the outside of my home?  Regularly fix the edges of walkways and driveways and fix any cracks.  Remove anything that might make you trip as you walk through a door, such as a raised step or threshold.  Trim any bushes or trees on the path to your home.  Use bright outdoor lighting.  Clear any walking paths of anything that might make someone trip, such as rocks or tools.  Regularly check to see if handrails are loose or broken. Make sure that both sides of any steps have handrails.  Any raised decks and porches should have guardrails on the edges.  Have any leaves, snow, or ice cleared regularly.  Use sand or salt on walking paths during winter.  Clean up any spills in your garage right away. This includes oil or grease spills. What can I do in the bathroom?  Use night lights.  Install grab bars by the toilet and in the tub and shower. Do not use towel bars as grab bars.  Use non-skid mats or decals in the tub or shower.  If you need to sit down in the shower, use a plastic, non-slip stool.  Keep the floor dry. Clean up any water that spills on the floor as soon as it happens.  Remove soap buildup in the tub or shower regularly.  Attach bath mats securely with double-sided non-slip rug tape.  Do not have throw rugs and other things on the floor that can make you trip. What can I do in the  bedroom?  Use night lights.  Make sure that you have a light by your bed that is easy to reach.  Do not use any sheets or blankets that are too big for your bed. They should not hang down onto the floor.  Have a firm chair that has side arms. You can use this for support while you get dressed.  Do not have throw rugs and other things on the floor that can make you trip. What can I do in the kitchen?  Clean up any spills right away.  Avoid walking on wet floors.  Keep items that you use a lot in easy-to-reach places.  If you need to reach something above you, use a strong step stool that has a grab bar.  Keep electrical cords out of the way.  Do not use floor polish or wax that makes floors slippery. If you must use wax, use non-skid floor wax.  Do not have throw rugs and other things on the floor that can make you trip. What can I do with my stairs?  Do not leave any items on the stairs.  Make sure that there are handrails  on both sides of the stairs and use them. Fix handrails that are broken or loose. Make sure that handrails are as long as the stairways.  Check any carpeting to make sure that it is firmly attached to the stairs. Fix any carpet that is loose or worn.  Avoid having throw rugs at the top or bottom of the stairs. If you do have throw rugs, attach them to the floor with carpet tape.  Make sure that you have a light switch at the top of the stairs and the bottom of the stairs. If you do not have them, ask someone to add them for you. What else can I do to help prevent falls?  Wear shoes that:  Do not have high heels.  Have rubber bottoms.  Are comfortable and fit you well.  Are closed at the toe. Do not wear sandals.  If you use a stepladder:  Make sure that it is fully opened. Do not climb a closed stepladder.  Make sure that both sides of the stepladder are locked into place.  Ask someone to hold it for you, if possible.  Clearly mark and make  sure that you can see:  Any grab bars or handrails.  First and last steps.  Where the edge of each step is.  Use tools that help you move around (mobility aids) if they are needed. These include:  Canes.  Walkers.  Scooters.  Crutches.  Turn on the lights when you go into a dark area. Replace any light bulbs as soon as they burn out.  Set up your furniture so you have a clear path. Avoid moving your furniture around.  If any of your floors are uneven, fix them.  If there are any pets around you, be aware of where they are.  Review your medicines with your doctor. Some medicines can make you feel dizzy. This can increase your chance of falling. Ask your doctor what other things that you can do to help prevent falls. This information is not intended to replace advice given to you by your health care provider. Make sure you discuss any questions you have with your health care provider. Document Released: 09/22/2009 Document Revised: 05/03/2016 Document Reviewed: 12/31/2014 Elsevier Interactive Patient Education  2017 [employer].

## 2017-10-16 ENCOUNTER — Encounter: Payer: Self-pay | Admitting: Family Medicine

## 2017-10-16 ENCOUNTER — Ambulatory Visit (INDEPENDENT_AMBULATORY_CARE_PROVIDER_SITE_OTHER): Payer: Medicare Other | Admitting: Family Medicine

## 2017-10-16 VITALS — BP 177/110 | HR 89 | Temp 98.0°F | Wt 236.7 lb

## 2017-10-16 DIAGNOSIS — M25561 Pain in right knee: Secondary | ICD-10-CM | POA: Diagnosis not present

## 2017-10-16 DIAGNOSIS — M545 Low back pain: Secondary | ICD-10-CM | POA: Diagnosis not present

## 2017-10-16 DIAGNOSIS — I129 Hypertensive chronic kidney disease with stage 1 through stage 4 chronic kidney disease, or unspecified chronic kidney disease: Secondary | ICD-10-CM | POA: Diagnosis not present

## 2017-10-16 DIAGNOSIS — M25512 Pain in left shoulder: Secondary | ICD-10-CM

## 2017-10-16 DIAGNOSIS — Z8679 Personal history of other diseases of the circulatory system: Secondary | ICD-10-CM | POA: Diagnosis not present

## 2017-10-16 LAB — UA/M W/RFLX CULTURE, ROUTINE
BILIRUBIN UA: NEGATIVE
GLUCOSE, UA: NEGATIVE
Ketones, UA: NEGATIVE
Leukocytes, UA: NEGATIVE
Nitrite, UA: NEGATIVE
PH UA: 6 (ref 5.0–7.5)
PROTEIN UA: NEGATIVE
RBC UA: NEGATIVE
SPEC GRAV UA: 1.005 (ref 1.005–1.030)
UUROB: 0.2 mg/dL (ref 0.2–1.0)

## 2017-10-16 MED ORDER — PREDNISONE 10 MG PO TABS: ORAL_TABLET | ORAL | 0 refills | Status: DC

## 2017-10-16 MED ORDER — METOPROLOL SUCCINATE ER 50 MG PO TB24
100.0000 mg | ORAL_TABLET | Freq: Two times a day (BID) | ORAL | 3 refills | Status: DC
Start: 2017-10-16 — End: 2018-05-30

## 2017-10-16 NOTE — Progress Notes (Signed)
BP (!) 177/110 (BP Location: Right Arm, Patient Position: Sitting, Cuff Size: Large)   Pulse 89   Temp 98 F (36.7 C) (Tympanic)   Wt 236 lb 11.2 oz (107.4 kg)   SpO2 97%   BMI 41.93 kg/m    Subjective:    Patient ID: Derek Binet., male    DOB: [birth year], 51 y.o.   MRN: [id number]  HPI: Derek Poulson. is a 51 y.o. male  Chief Complaint  Patient presents with  . Back Pain    Fell from attic two weeks ago. Having back pain. Doesn't know if it's from kidneys or from fall.  . Hypertension   Patient presents with 2 weeks of severe pain following a fall from an attic. Right hip, knee, and foot are the worst for him. Right foot is what took the impact from the fall. Went to UC day after accident and xray of foot showed fracture. Got a boot but the boot hurt badly so he hasn't been wearing it. Swelling has gone down quite a bit in the foot. Grabbed onto something during fall with left shoulder and thinks he tore his rotator cuff. Arm is now weak, and motion is extremely painful. Wakes him up in the middle of the night.   BPs have been up the past few weeks, pt feels it may be related to the pain he's in. Denies CP, SOB, worsening HAs.   Pt also notes his Neurologist he's seeing is only a HA specialist and can't check on his aneurysm coil so he needs a referral to neurosurgery for this as it is several years overdue.   Past Medical History:  Diagnosis Date  . Abdominal abscess   . Acute pancreatitis   . Anxiety   . Asthma   . Back pain   . Depression   . Diabetes mellitus without complication ([place])   . Emphysema of lung ([place])    right sided  . GERD (gastroesophageal reflux disease)   . Hemorrhage into subarachnoid space of neuraxis ([place]) 01/26/2010  . Hypertension   . Mild cognitive impairment   . MRSA (methicillin resistant Staphylococcus aureus)   . Obesity   . Panic disorder   . Perforated bowel ([place]) 2007   Tempoary Colostomy Bag, Skin Graft for Abd wound  .  Sleep apnea    sleep study 2013  . Subarachnoid hemorrhage ([place]) 2011   coil placed  . Type 2 diabetes mellitus without complication, without long-term current use of insulin ([place]) 04/17/2016  . Vitreous hemorrhage ([place]) 03/27/2011   Overview:  Bilateral; 01/2010, from [facility]    Social History   Socioeconomic History  . Marital status: Divorced    Spouse name: Not on file  . Number of children: Not on file  . Years of education: Not on file  . Highest education level: Not on file  Social Needs  . Financial resource strain: Not on file  . Food insecurity - worry: Not on file  . Food insecurity - inability: Not on file  . Transportation needs - medical: Not on file  . Transportation needs - non-medical: Not on file  Occupational History  . Not on file  Tobacco Use  . Smoking status: Current Every Day Smoker    Packs/day: 2.00    Types: Cigarettes    Start date: 10/11/1984  . Smokeless tobacco: Never Used  Substance and Sexual Activity  . Alcohol use: Yes    Alcohol/week: 0.0 oz  Comment: Rarely  . Drug use: No  . Sexual activity: Not Currently  Other Topics Concern  . Not on file  Social History Narrative  . Not on file    Relevant past medical, surgical, family and social history reviewed and updated as indicated. Interim medical history since our last visit reviewed. Allergies and medications reviewed and updated.  Review of Systems  Constitutional: Negative.   HENT: Negative.   Eyes: Negative.   Respiratory: Negative.   Cardiovascular: Negative.   Gastrointestinal: Negative.   Genitourinary: Negative.   Musculoskeletal: Positive for arthralgias, back pain, gait problem, joint swelling and myalgias.  Psychiatric/Behavioral: Negative.     Per HPI unless specifically indicated above     Objective:    BP (!) 177/110 (BP Location: Right Arm, Patient Position: Sitting, Cuff Size: Large)   Pulse 89   Temp 98 F (36.7 C) (Tympanic)   Wt 236 lb 11.2 oz (107.4 kg)    SpO2 97%   BMI 41.93 kg/m   Wt Readings from Last 3 Encounters:  10/16/17 236 lb 11.2 oz (107.4 kg)  10/09/17 242 lb 11.2 oz (110.1 kg)  09/17/17 239 lb 4.8 oz (108.5 kg)    Physical Exam  Constitutional: He appears well-developed and well-nourished. No distress.  HENT:  Head: Atraumatic.  Eyes: Conjunctivae are normal. Pupils are equal, round, and reactive to light. No scleral icterus.  Neck: Normal range of motion. Neck supple.  Cardiovascular: Normal rate.  Pulmonary/Chest: Effort normal. No respiratory distress.  Musculoskeletal:  Antalgic gait Midline ttp over lumbar spine, as well as paraspinal ttp b/l Right knee edematous and ttp, crepitus with PROM  Lymphadenopathy:    He has no cervical adenopathy.  Neurological: He is alert. He exhibits normal muscle tone. Coordination normal.  Skin: Skin is warm and dry.  Psychiatric: He has a normal mood and affect. His behavior is normal.  Nursing note and vitals reviewed.   Results for orders placed or performed in visit on 10/16/17  UA/M w/rflx Culture, Routine  Result Value Ref Range   Specific Gravity, UA 1.005 1.005 - 1.030   pH, UA 6.0 5.0 - 7.5   Color, UA Yellow Yellow   Appearance Ur Clear Clear   Leukocytes, UA Negative Negative   Protein, UA Negative Negative/Trace   Glucose, UA Negative Negative   Ketones, UA Negative Negative   RBC, UA Negative Negative   Bilirubin, UA Negative Negative   Urobilinogen, Ur 0.2 0.2 - 1.0 mg/dL   Nitrite, UA Negative Negative      Assessment & Plan:   Problem List Items Addressed This Visit      Genitourinary   Benign hypertensive renal disease    Will increase metoprolol to 50 mg BID with close home monitoring of both BP and HR. Discussed abnormal levels to let us know about as well as sxs of hypotension and bradycardia. Continue other medications as before.         Other   Hx of aneurysm    Referral placed to neurosurgery for monitoring      Relevant Orders    Ambulatory referral to Neurosurgery    Other Visit Diagnoses    Acute bilateral low back pain, with sciatica presence unspecified    -  Primary   Will get lumbar x-ray given severity worsening since accident 2 weeks ago. RICE, lidocaine patches, heat, await results   Relevant Medications   predniSONE (DELTASONE) 10 MG tablet   Other Relevant Orders   UA/M w/rflx  Culture, Routine (Completed)   DG Lumbar Spine Complete   Acute pain of right knee       Will get x-ray, likely just flared arthritis due to impact from fall. RICE, tylenol   Relevant Orders   DG Knee Complete 4 Views Right   Acute pain of left shoulder       Await x-ray, referral placed to orthopedics for further evaluation per patient request as he believes he has injured his rotator cuff. Tylenol prn   Relevant Orders   DG Shoulder Left   AMB referral to orthopedics       Follow up plan: Return in about 6 weeks (around 11/27/2017) for BP f/u.

## 2017-10-19 NOTE — Patient Instructions (Signed)
Follow-up in 6 weeks

## 2017-10-19 NOTE — Assessment & Plan Note (Signed)
Referral placed to neurosurgery for monitoring

## 2017-10-19 NOTE — Assessment & Plan Note (Signed)
Will increase metoprolol to 50 mg BID with close home monitoring of both BP and HR. Discussed abnormal levels to let us know about as well as sxs of hypotension and bradycardia. Continue other medications as before.

## 2017-10-23 ENCOUNTER — Telehealth: Payer: Self-pay | Admitting: Family Medicine

## 2017-10-23 NOTE — Telephone Encounter (Signed)
Copied from [place] [id number]. Topic: Referral - Status >> Oct 23, 2017  4:34 PM Cleaster Cor[place], [place] wrote: Reason for CRM:  Ophelia Shoulder [phone] from [facility] clinic neurosurgery called and said that pt. Needs to be referred to a Vascular neurosurgeon pt. Would need a referral from Dr. Orene Desanctis please give Ophelia Shoulder a call with any questions

## 2017-10-24 NOTE — Telephone Encounter (Signed)
Can you please modify the referral to a vascular neurosurgeon?

## 2017-10-24 NOTE — Telephone Encounter (Signed)
Referral redirected

## 2017-10-28 ENCOUNTER — Ambulatory Visit
Admission: RE | Admit: 2017-10-28 | Discharge: 2017-10-28 | Disposition: A | Discharge status: Home / Self Care | Payer: Medicare Other | Source: Ambulatory Visit | Attending: Family Medicine | Admitting: Family Medicine

## 2017-10-28 DIAGNOSIS — S4992XA Unspecified injury of left shoulder and upper arm, initial encounter: Secondary | ICD-10-CM | POA: Diagnosis not present

## 2017-10-28 DIAGNOSIS — M1711 Unilateral primary osteoarthritis, right knee: Secondary | ICD-10-CM | POA: Diagnosis not present

## 2017-10-28 DIAGNOSIS — M545 Low back pain: Secondary | ICD-10-CM | POA: Insufficient documentation

## 2017-10-28 DIAGNOSIS — M19012 Primary osteoarthritis, left shoulder: Secondary | ICD-10-CM | POA: Diagnosis not present

## 2017-10-28 DIAGNOSIS — I708 Atherosclerosis of other arteries: Secondary | ICD-10-CM | POA: Insufficient documentation

## 2017-10-28 DIAGNOSIS — I700 Atherosclerosis of aorta: Secondary | ICD-10-CM | POA: Insufficient documentation

## 2017-10-28 DIAGNOSIS — S3992XA Unspecified injury of lower back, initial encounter: Secondary | ICD-10-CM | POA: Diagnosis not present

## 2017-10-28 DIAGNOSIS — M25512 Pain in left shoulder: Secondary | ICD-10-CM | POA: Diagnosis not present

## 2017-10-28 DIAGNOSIS — M4186 Other forms of scoliosis, lumbar region: Secondary | ICD-10-CM | POA: Diagnosis not present

## 2017-10-28 DIAGNOSIS — M25561 Pain in right knee: Secondary | ICD-10-CM

## 2017-10-28 IMAGING — DX DG SHOULDER 2+V*L*
3 series · 3 of 3 positions shown · non-contrast
Comparison: None.

CLINICAL DATA: Left shoulder pain and weakness since that fall 1
month ago

EXAM:
LEFT SHOULDER - 2+ VIEW

[shoulder ap]
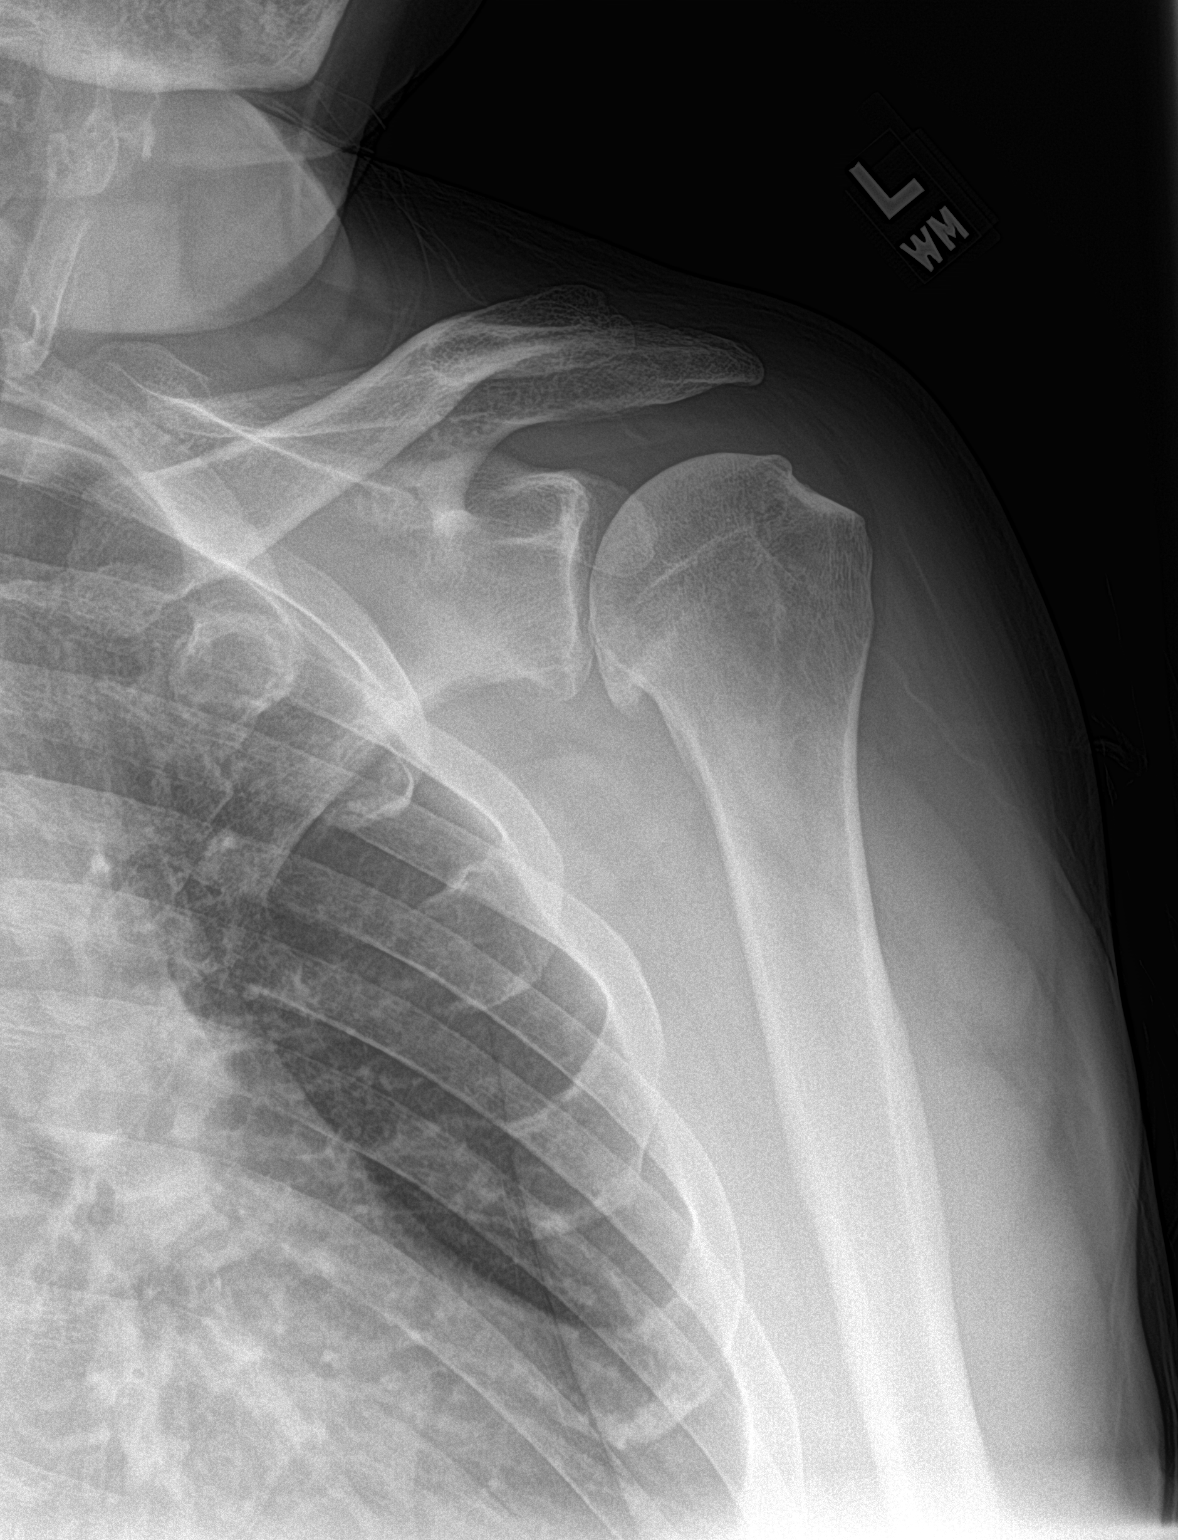

[shoulder y-view]
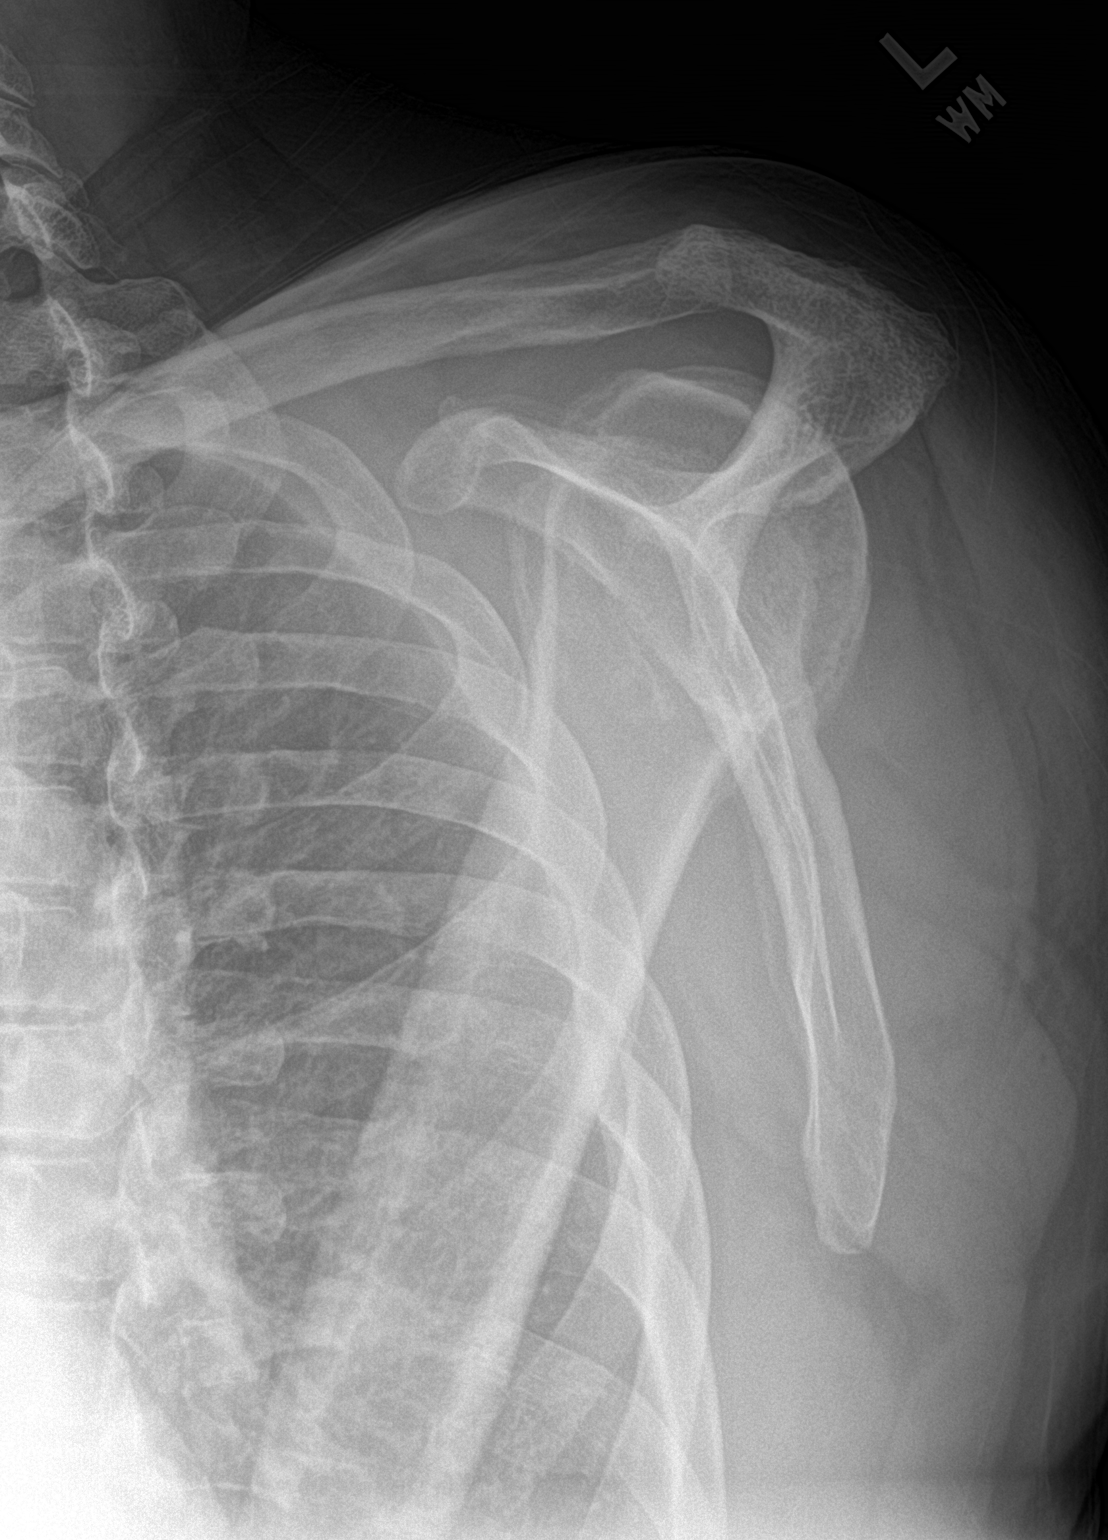

[shoulder axial]
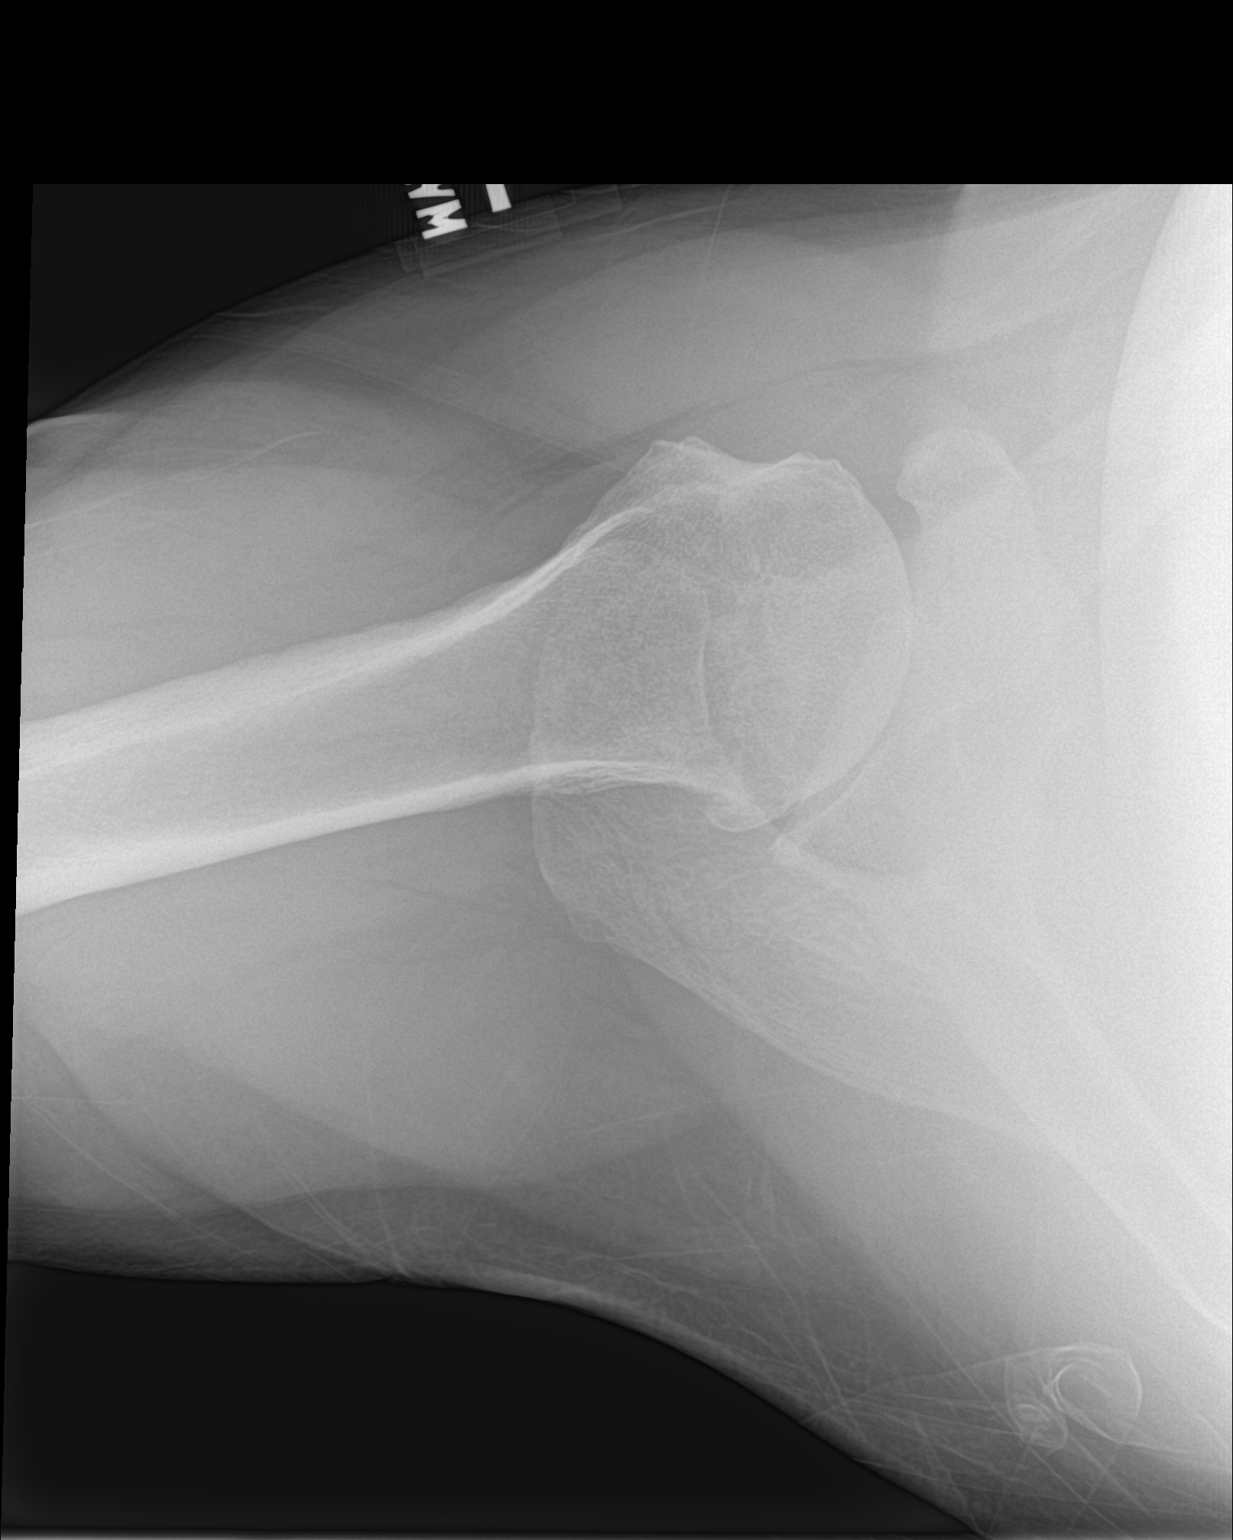

[3 of 3 positions shown; findings below may reference images not displayed]

FINDINGS: There is age advanced degenerative joint disease of the left
shoulder with loss of joint space and spurring present. No acute
fracture is seen. The left AC joint appears normally aligned. The
ribs that are visualized are intact.
IMPRESSION: Age advanced degenerative joint disease of the left shoulder.

## 2017-10-28 IMAGING — DX DG KNEE COMPLETE 4+V*R*
5 series · 5 of 5 positions shown · non-contrast
Comparison: None.

CLINICAL DATA: Right knee pain for 1 month after fall

EXAM:
RIGHT KNEE - COMPLETE 4+ VIEW

[knee ap]
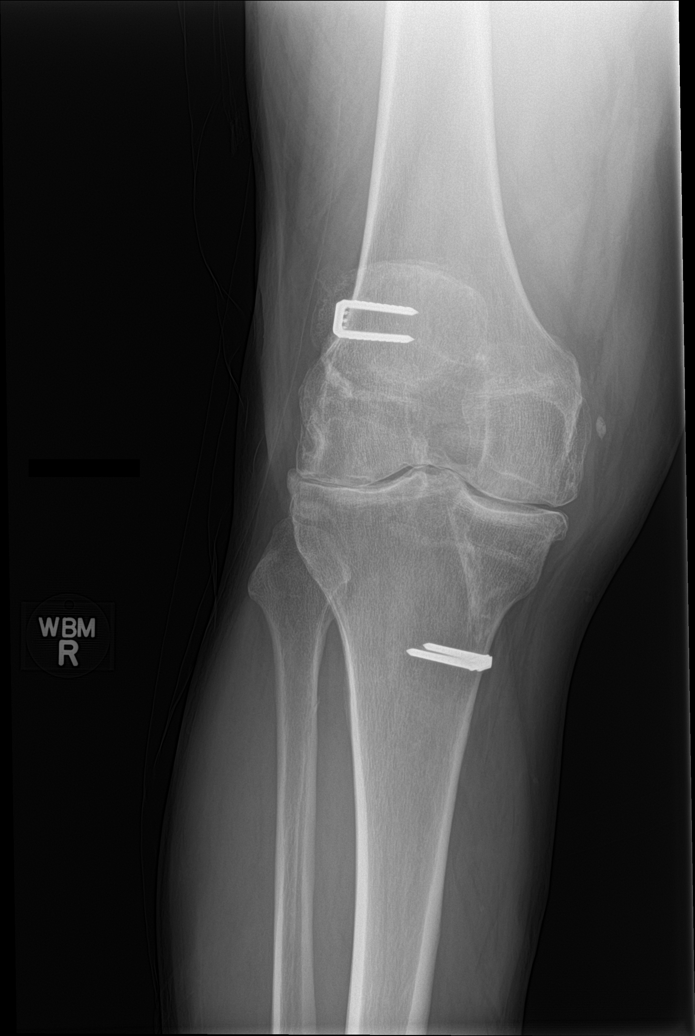

[knee tunnel (1 of 2)]
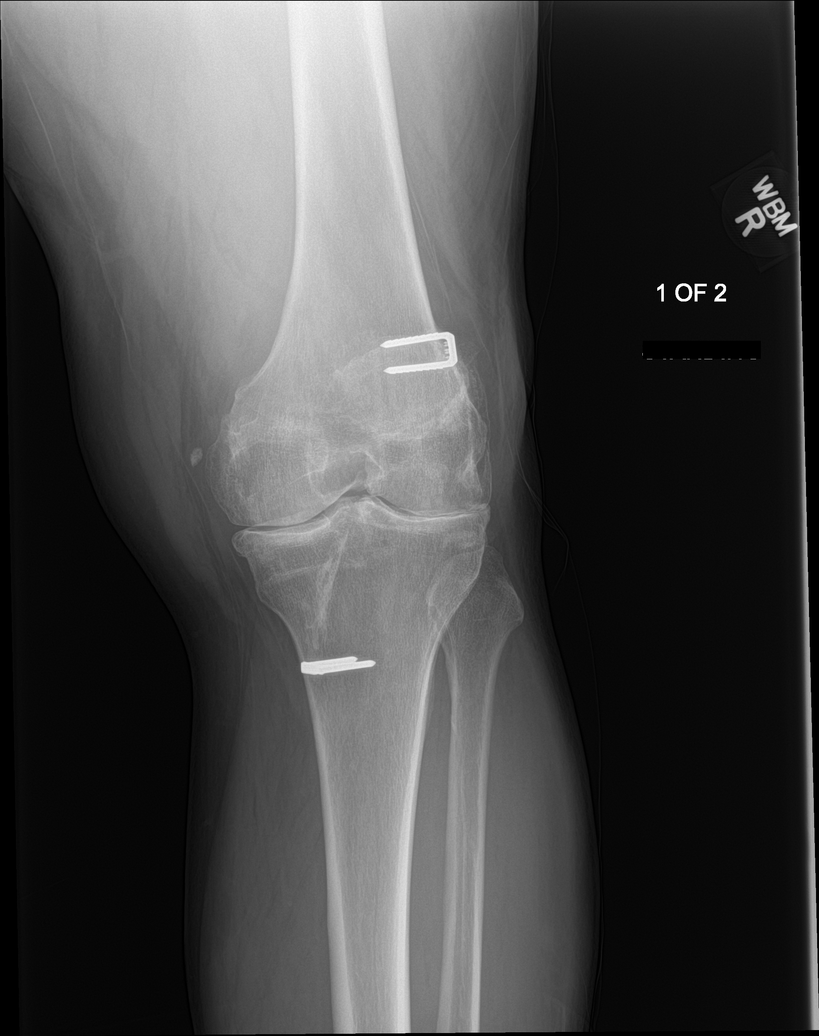

[knee tunnel (2 of 2)]
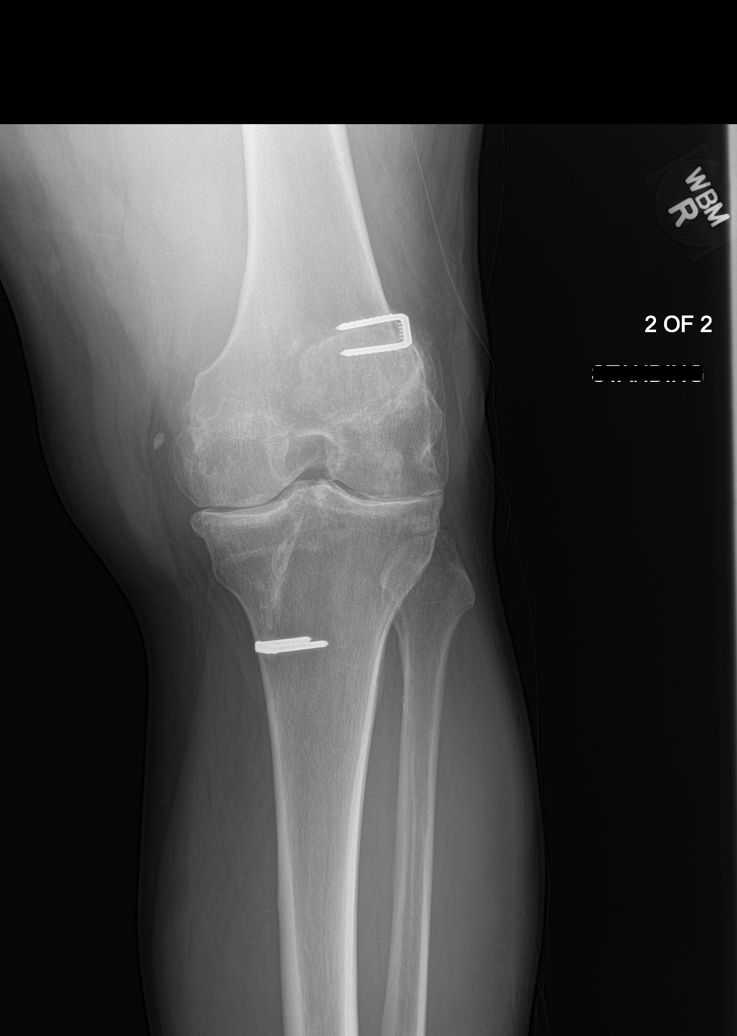

[patella skyline]
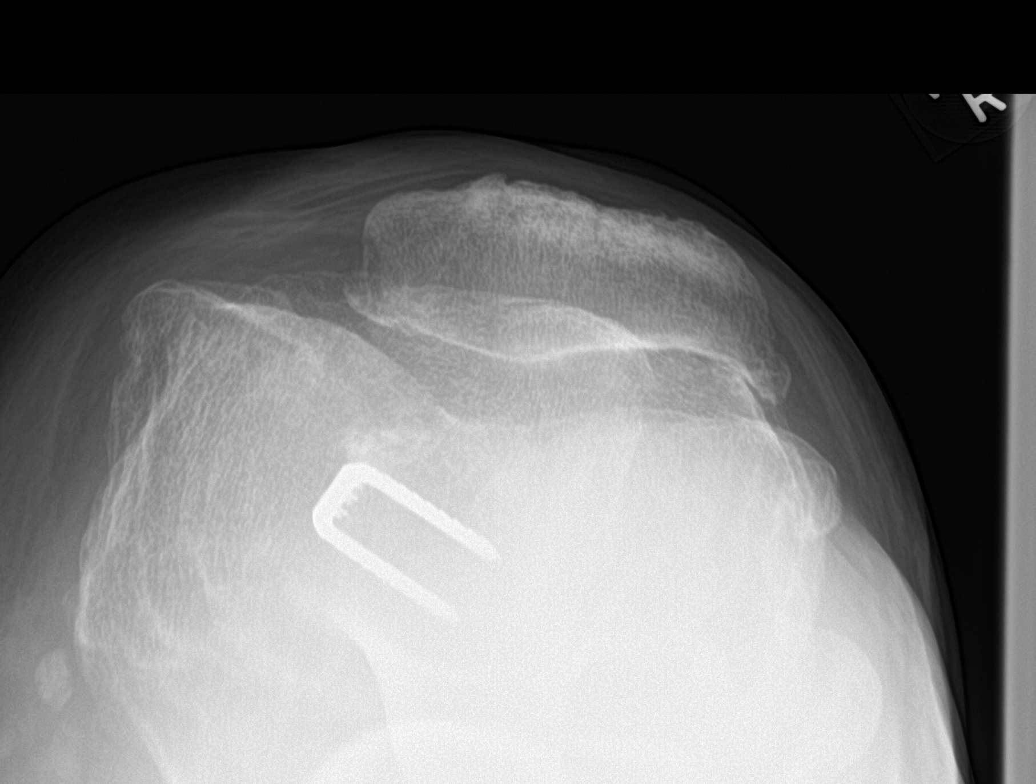

[knee lat]
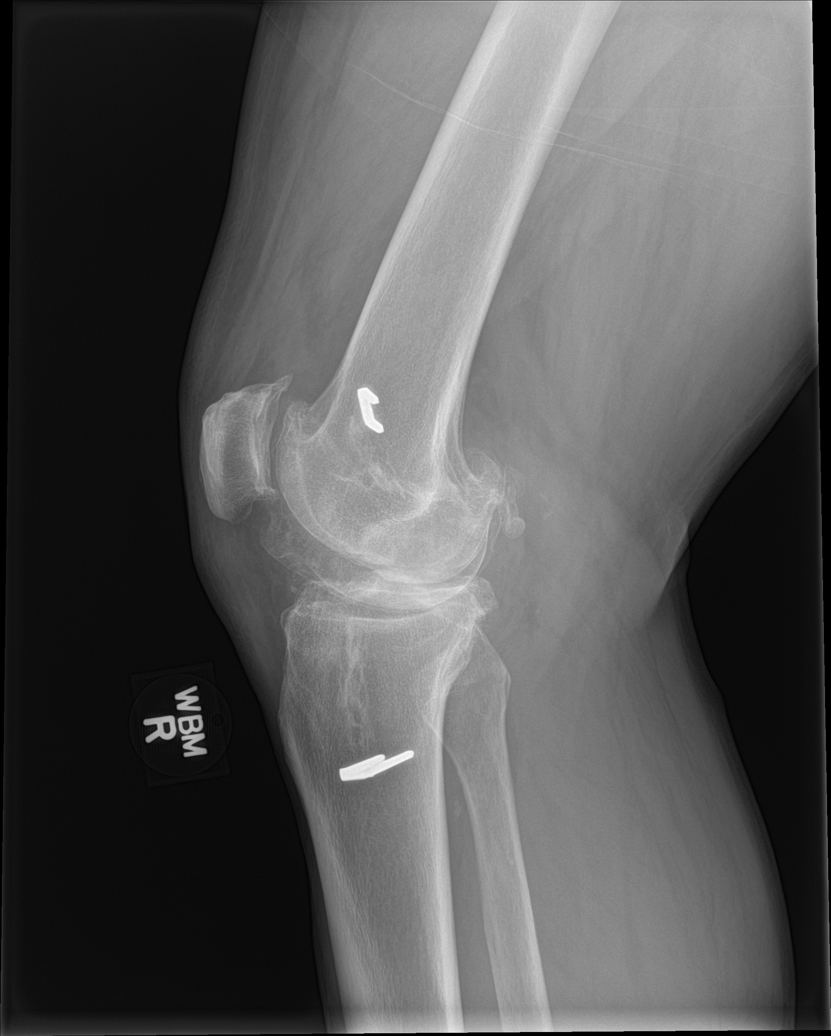

[5 of 5 positions shown; findings below may reference images not displayed]

FINDINGS: There is significant tricompartmental degenerative joint disease of
the right knee involving all 3 compartments. There is almost
complete loss of joint space involving the lateral compartment.
Degenerative spurring and sclerosis is noted. There are U shaped
pins within the distal right femur laterally and within the proximal
right tibia medially for ACL repair previously. A tiny amount of
joint effusion cannot be excluded.
IMPRESSION: Significant tricompartmental degenerative joint disease of the right
knee for age.

## 2017-10-28 IMAGING — DX DG LUMBAR SPINE COMPLETE 4+V
6 series · 6 of 6 positions shown · non-contrast
Comparison: CT [DATE].

CLINICAL DATA: Pain after falling.

EXAM:
LUMBAR SPINE - COMPLETE 4+ VIEW

[l-spine ap]
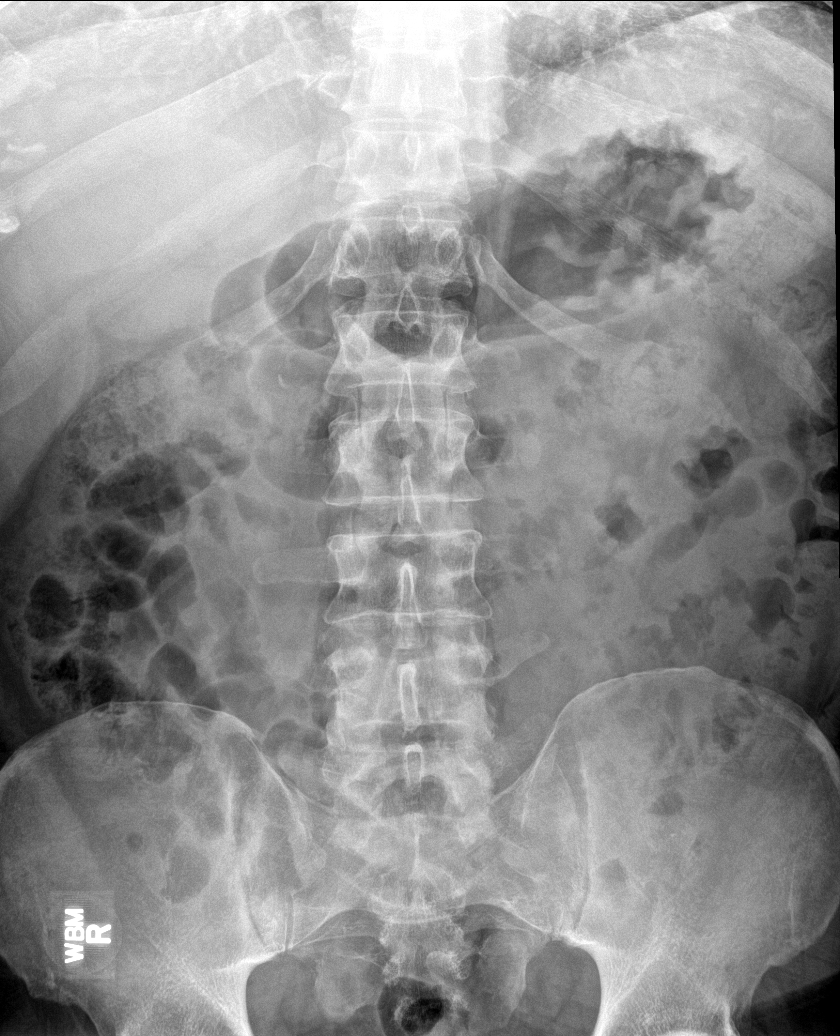

[l-spine obl (1 of 3)]
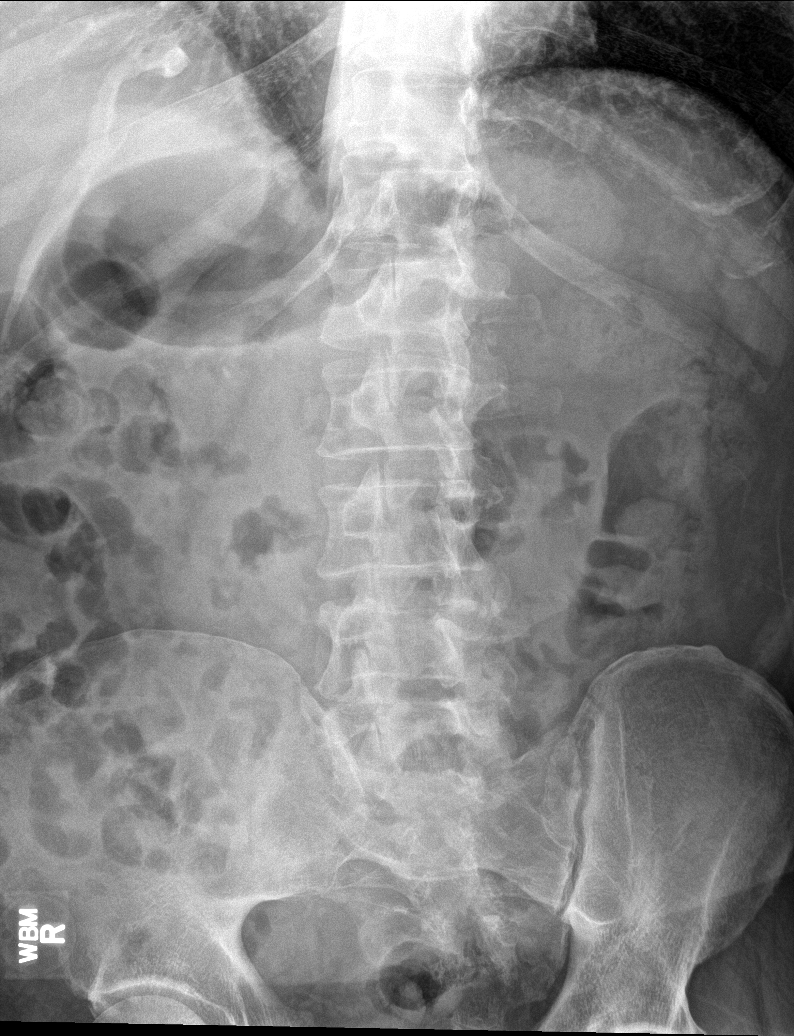

[l-spine obl (2 of 3)]
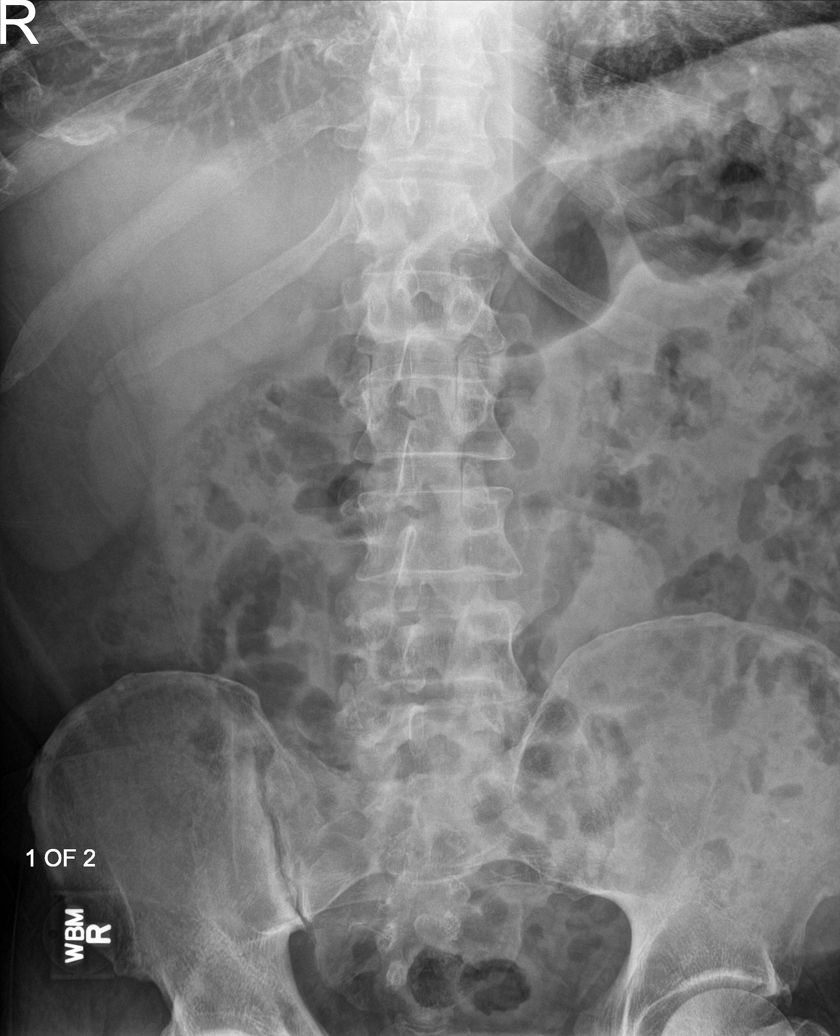

[l-spine lat]
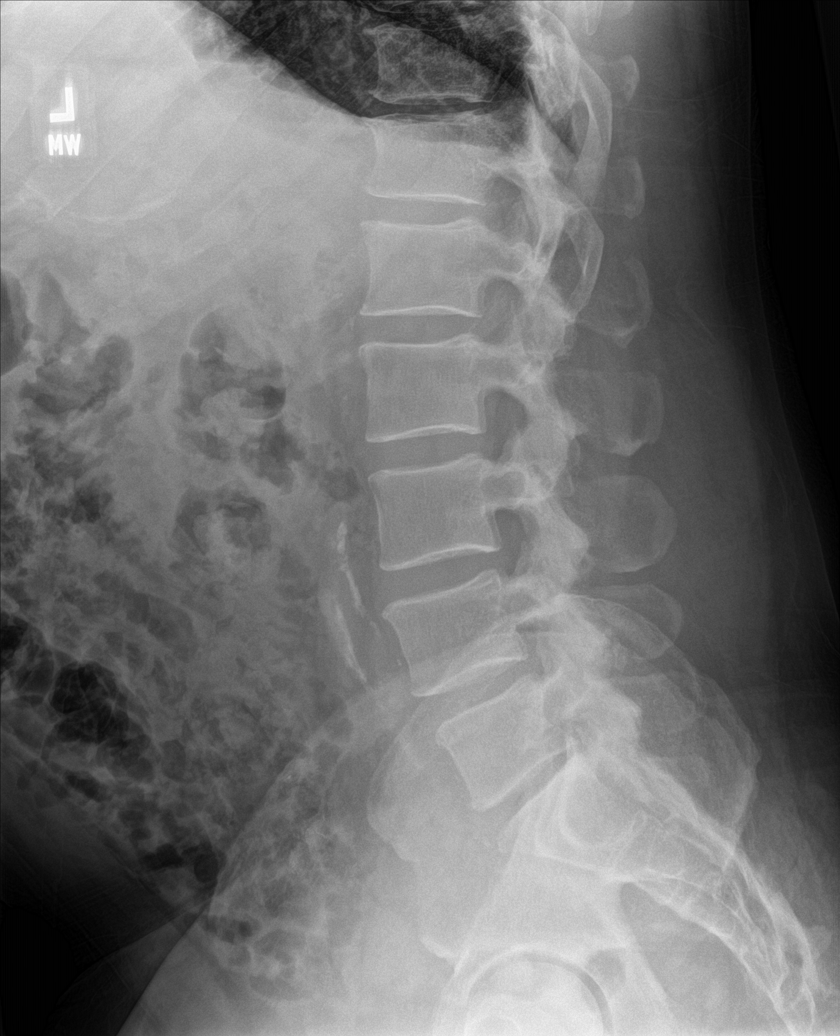

[l-spine spot]
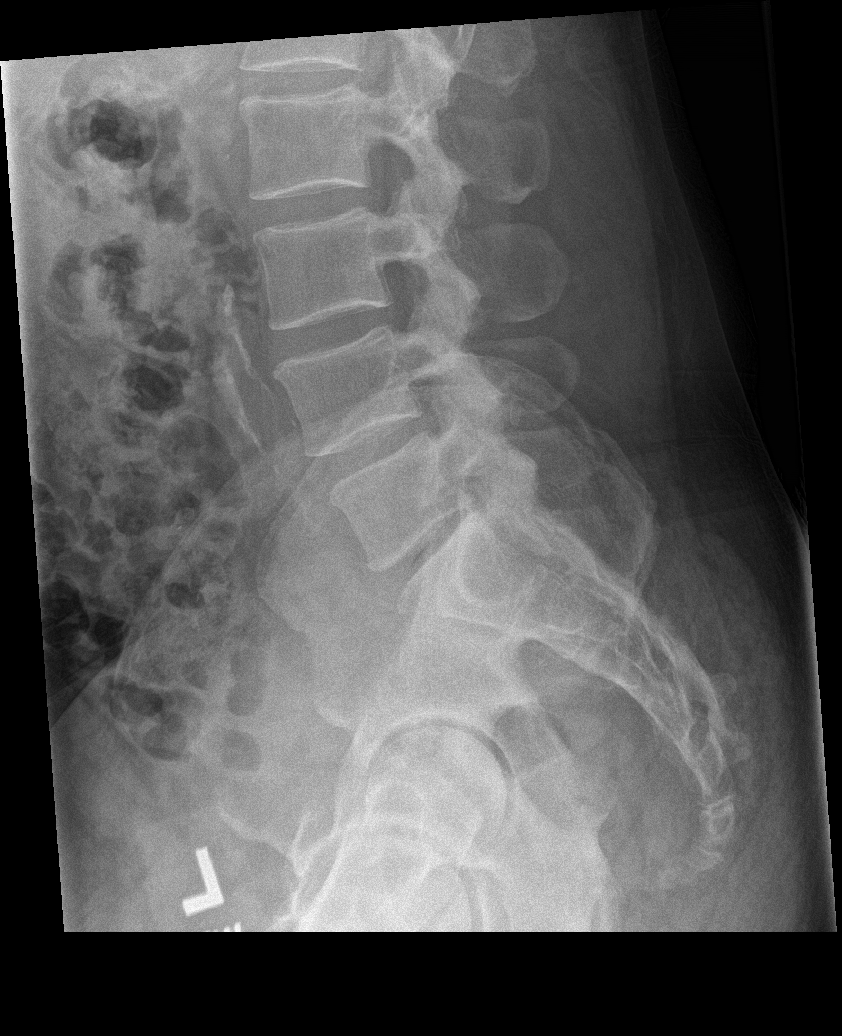

[l-spine obl (3 of 3)]
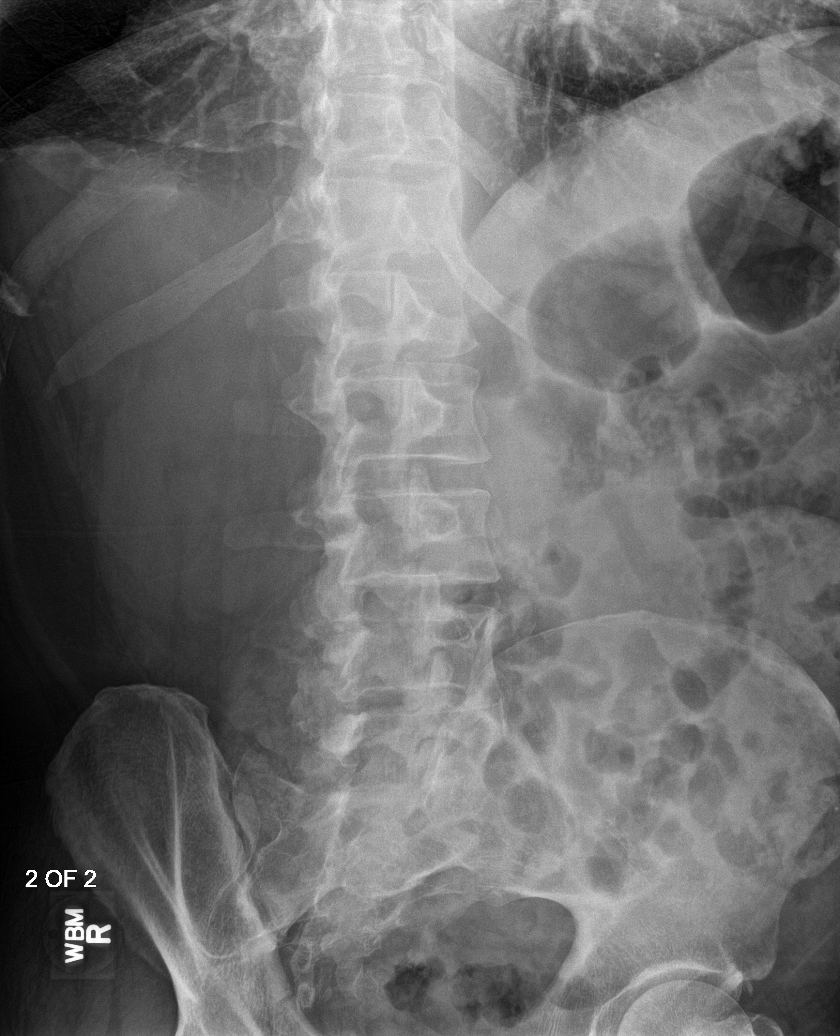

[6 of 6 positions shown; findings below may reference images not displayed]

FINDINGS: Mild scoliosis concave left. No acute bony abnormality identified.
Tiny circum linear calcification noted over the right upper flank
suggesting right renal artery aneurysm. Renal CTA can be obtained
for further evaluation. Aortic atherosclerotic vascular
calcification.
IMPRESSION: 1.  Mild scoliosis concave left.  No acute abnormality.

2. Small circum linear calcification right upper flank suggesting
right renal artery aneurysm. Renal CTA can be obtained for further
evaluation as needed .

3.  Aortoiliac atherosclerotic vascular calcification .

## 2017-11-27 ENCOUNTER — Ambulatory Visit: Payer: Medicare Other | Admitting: Family Medicine

## 2017-12-09 ENCOUNTER — Other Ambulatory Visit: Payer: Self-pay | Admitting: Psychiatry

## 2017-12-09 ENCOUNTER — Telehealth: Payer: Self-pay

## 2017-12-09 DIAGNOSIS — F330 Major depressive disorder, recurrent, mild: Secondary | ICD-10-CM

## 2017-12-09 MED ORDER — MIRTAZAPINE 15 MG PO TABS: 15.0000 mg | ORAL_TABLET | Freq: Every day | ORAL | 1 refills | Status: DC

## 2017-12-09 MED ORDER — CLONAZEPAM 0.5 MG PO TABS: 0.5000 mg | ORAL_TABLET | Freq: Two times a day (BID) | ORAL | 0 refills | Status: DC | PRN

## 2017-12-09 MED ORDER — DULOXETINE HCL 60 MG PO CPEP: 60.0000 mg | ORAL_CAPSULE | Freq: Two times a day (BID) | ORAL | 1 refills | Status: DC

## 2017-12-09 NOTE — Telephone Encounter (Signed)
DONE

## 2017-12-09 NOTE — Telephone Encounter (Signed)
Reviewed [place] controlled substance database.  Send medications Remeron, Cymbalta, Klonopin to her pharmacy.  She will see me on 12/17/2017.

## 2017-12-09 NOTE — Telephone Encounter (Signed)
pt is a dr. Kandice Moos pt and has not been seen since 05-27-17. needs reviill and an appt was made for 12-17-17 with Dr. Shea Evans. like to get enough medication to get to that appt.   Cynbalta, remeron, clonazepam   Disp Refills Start End   DULoxetine (CYMBALTA) 60 MG capsule 180 capsule 1 05/27/2017    Sig - Route: Take 1 capsule (60 mg total) by mouth 2 (two) times daily. - Oral   Sent to pharmacy as: DULoxetine (CYMBALTA) 60 MG capsule   E-Prescribing Status: Receipt confirmed by pharmacy (05/27/2017 10:20 AM EDT)     Disp Refills Start End   mirtazapine (REMERON) 15 MG tablet 90 tablet 1 05/27/2017    Sig - Route: Take 1 tablet (15 mg total) by mouth at bedtime. - Oral   Sent to pharmacy as: mirtazapine (REMERON) 15 MG tablet   E-Prescribing Status: Receipt confirmed by pharmacy (05/27/2017 10:19 AM EDT)     Disp Refills Start End   clonazePAM (KLONOPIN) 0.5 MG tablet 60 tablet 4 05/27/2017    Sig - Route: Take 1 tablet (0.5 mg total) by mouth 2 (two) times daily as needed for anxiety. - Oral   Class: Print

## 2017-12-17 ENCOUNTER — Ambulatory Visit (INDEPENDENT_AMBULATORY_CARE_PROVIDER_SITE_OTHER): Payer: Medicare Other | Admitting: Psychiatry

## 2017-12-17 ENCOUNTER — Encounter: Payer: Self-pay | Admitting: Psychiatry

## 2017-12-17 ENCOUNTER — Telehealth: Payer: Self-pay

## 2017-12-17 ENCOUNTER — Other Ambulatory Visit: Payer: Self-pay

## 2017-12-17 VITALS — BP 228/136 | HR 81 | Temp 98.4°F | Wt 241.6 lb

## 2017-12-17 DIAGNOSIS — F4000 Agoraphobia, unspecified: Secondary | ICD-10-CM | POA: Diagnosis not present

## 2017-12-17 DIAGNOSIS — Z8659 Personal history of other mental and behavioral disorders: Secondary | ICD-10-CM

## 2017-12-17 DIAGNOSIS — F17200 Nicotine dependence, unspecified, uncomplicated: Secondary | ICD-10-CM

## 2017-12-17 DIAGNOSIS — F3341 Major depressive disorder, recurrent, in partial remission: Secondary | ICD-10-CM | POA: Diagnosis not present

## 2017-12-17 DIAGNOSIS — F410 Panic disorder [episodic paroxysmal anxiety] without agoraphobia: Secondary | ICD-10-CM

## 2017-12-17 DIAGNOSIS — G4733 Obstructive sleep apnea (adult) (pediatric): Secondary | ICD-10-CM | POA: Diagnosis not present

## 2017-12-17 NOTE — Telephone Encounter (Signed)
pt had appt today which he was late for pt bp was 228/136 pt was told to stop by nurse office before he left to have it recheck . pt did not. note was made in chart and I will put in the mail information about high blood pressure.  I will also call his pcp in the morning and notify that pt bp was high and I will also contact pt to make sure he is feeling ok.

## 2017-12-17 NOTE — Progress Notes (Signed)
Pt bp was extremely high. 228/136 pt states that he did not think he had taken his bp medication today. Pt was advised to contact his PCP
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
## 2017-12-17 NOTE — Patient Instructions (Signed)

## 2017-12-17 NOTE — Telephone Encounter (Signed)
I repeated his blood pressure during the assessment.  His blood pressure came down to 193/115 and his heart rate 75 on recheck.   Patient reports he may not have been compliant on his blood pressure medication this morning.  He agrees to call his PCP for reevaluation.

## 2017-12-18 ENCOUNTER — Encounter: Payer: Self-pay | Admitting: Psychiatry

## 2018-01-13 ENCOUNTER — Telehealth: Payer: Self-pay

## 2018-01-13 NOTE — Telephone Encounter (Signed)
received a request for refill on clonazepam .5mg     clonazePAM (KLONOPIN) 0.5 MG tablet 60 tablet 0 12/09/2017    Sig - Route: Take 1 tablet (0.5 mg total) by mouth 2 (two) times daily as needed for anxiety. - Oral   Sent to pharmacy as: clonazePAM (KLONOPIN) 0.5 MG tablet   E-Prescribing Status: Receipt confirmed by pharmacy (12/09/2017 2:58 PM EST)

## 2018-01-14 ENCOUNTER — Other Ambulatory Visit: Payer: Self-pay | Admitting: Psychiatry

## 2018-01-14 DIAGNOSIS — F330 Major depressive disorder, recurrent, mild: Secondary | ICD-10-CM

## 2018-01-14 MED ORDER — CLONAZEPAM 0.5 MG PO TABS: 0.5000 mg | ORAL_TABLET | Freq: Two times a day (BID) | ORAL | 0 refills | Status: DC | PRN

## 2018-01-14 NOTE — Telephone Encounter (Signed)
Sent Klonopin prescriptions to his pharmacy . Reviewed [place] controlled substance database.

## 2018-02-05 NOTE — Telephone Encounter (Signed)
clonazePAM (KLONOPIN) 0.5 MG tablet  Medication  Date: 01/14/2018 Department: [facility] Ordering/Authorizing: Ursula Alert, MD  Order Providers   Prescribing Provider Encounter Provider  Ursula Alert, MD Ursula Alert, MD  Medication Detail    Disp Refills Start End   clonazePAM (KLONOPIN) 0.5 MG tablet 60 tablet 0 01/14/2018    Sig - Route: Take 1 tablet (0.5 mg total) by mouth 2 (two) times daily as needed for anxiety. - Oral   Sent to pharmacy as: clonazePAM (KLONOPIN) 0.5 MG tablet   E-Prescribing Status: Receipt confirmed by pharmacy (01/14/2018 8:58 AM EST)

## 2018-02-17 ENCOUNTER — Ambulatory Visit: Payer: Medicare Other | Admitting: Psychiatry

## 2018-02-17 ENCOUNTER — Ambulatory Visit: Payer: Medicare Other | Admitting: Family Medicine

## 2018-03-17 DIAGNOSIS — I609 Nontraumatic subarachnoid hemorrhage, unspecified: Secondary | ICD-10-CM | POA: Diagnosis not present

## 2018-03-17 DIAGNOSIS — L02211 Cutaneous abscess of abdominal wall: Secondary | ICD-10-CM | POA: Diagnosis not present

## 2018-03-17 DIAGNOSIS — T8579XD Infection and inflammatory reaction due to other internal prosthetic devices, implants and grafts, subsequent encounter: Secondary | ICD-10-CM | POA: Diagnosis not present

## 2018-03-17 DIAGNOSIS — A4902 Methicillin resistant Staphylococcus aureus infection, unspecified site: Secondary | ICD-10-CM | POA: Diagnosis not present

## 2018-03-25 ENCOUNTER — Other Ambulatory Visit: Payer: Self-pay | Admitting: Psychiatry

## 2018-03-25 DIAGNOSIS — F330 Major depressive disorder, recurrent, mild: Secondary | ICD-10-CM

## 2018-05-30 ENCOUNTER — Encounter: Payer: Self-pay | Admitting: Physician Assistant

## 2018-05-30 ENCOUNTER — Ambulatory Visit (INDEPENDENT_AMBULATORY_CARE_PROVIDER_SITE_OTHER): Payer: Medicare Other | Admitting: Physician Assistant

## 2018-05-30 VITALS — BP 203/114 | HR 98 | Wt 234.0 lb

## 2018-05-30 DIAGNOSIS — I129 Hypertensive chronic kidney disease with stage 1 through stage 4 chronic kidney disease, or unspecified chronic kidney disease: Secondary | ICD-10-CM | POA: Diagnosis not present

## 2018-05-30 DIAGNOSIS — Z8679 Personal history of other diseases of the circulatory system: Secondary | ICD-10-CM

## 2018-05-30 DIAGNOSIS — R7303 Prediabetes: Secondary | ICD-10-CM

## 2018-05-30 DIAGNOSIS — J441 Chronic obstructive pulmonary disease with (acute) exacerbation: Secondary | ICD-10-CM

## 2018-05-30 DIAGNOSIS — E7800 Pure hypercholesterolemia, unspecified: Secondary | ICD-10-CM | POA: Diagnosis not present

## 2018-05-30 DIAGNOSIS — M25512 Pain in left shoulder: Secondary | ICD-10-CM | POA: Diagnosis not present

## 2018-05-30 DIAGNOSIS — G8929 Other chronic pain: Secondary | ICD-10-CM | POA: Diagnosis not present

## 2018-05-30 MED ORDER — METOPROLOL SUCCINATE ER 50 MG PO TB24: 100.0000 mg | ORAL_TABLET | Freq: Two times a day (BID) | ORAL | 3 refills | Status: DC

## 2018-05-30 MED ORDER — FLUTICASONE PROPIONATE 50 MCG/ACT NA SUSP: 2.0000 | Freq: Every day | NASAL | 12 refills | Status: DC

## 2018-05-30 MED ORDER — BUDESONIDE-FORMOTEROL FUMARATE 160-4.5 MCG/ACT IN AERO: 2.0000 | INHALATION_SPRAY | Freq: Two times a day (BID) | RESPIRATORY_TRACT | 12 refills | Status: DC

## 2018-05-30 MED ORDER — LISINOPRIL 40 MG PO TABS: 40.0000 mg | ORAL_TABLET | Freq: Every day | ORAL | 3 refills | Status: DC

## 2018-05-30 MED ORDER — AMLODIPINE BESYLATE 10 MG PO TABS: 10.0000 mg | ORAL_TABLET | Freq: Every day | ORAL | 3 refills | Status: DC

## 2018-05-30 MED ORDER — ALBUTEROL SULFATE HFA 108 (90 BASE) MCG/ACT IN AERS: 1.0000 | INHALATION_SPRAY | Freq: Four times a day (QID) | RESPIRATORY_TRACT | 3 refills | Status: DC | PRN

## 2018-05-30 NOTE — Progress Notes (Signed)
Subjective:    Patient ID: Derek Cooley., male    DOB: [birth year], 52 y.o.   MRN: [id number]  [name]. is a 52 y.o. male presenting on 05/30/2018 for Hypertension (Pt experiencing Headaches. Requesting MRI brain at [facility]) and Referral (Ortho. Shoulders. 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 Patient had two year follow up angiogram on 02/08/2012. 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 Treatment options will be discussed with Dr. Alric Seton in neurosurgery clinic and in April."  Patient never followed up. Patient has had chronic headaches and still continues with these. He has seen neurology at [facility] last year and was prescribed Zanaflex and topamax but hasn't been to see the neurologist since. He needs refills on his medications and labwork. Today he denies any chest pain, worst headache of his life, SOB, diaphoresis, vomiting.   His blood pressure has remained uncontrolled for the better part of a year. He says he took all of his medications today but has not been taking them reliably. This may be due to some cognitive impairment from his [facility], his sister thinks. Patient also reports that some years ago he was run over by a back hoe and crushed. He has abdominal mesh and chronic pain resulting from this injury, is on daily doxycycline.   Also wanting a referral to orthopedics. He reports falling out of an attic last year and hurting his left shoulder. Limited Abduction and pain with ROM in this shoulder since then. Right shoulder unaffected.   Social History   Tobacco Use  . Smoking status: Current Every Day Smoker    Packs/day: 2.00    Types: Cigarettes    Start date: 10/11/1984  . Smokeless tobacco: Never Used  Substance Use Topics  . Alcohol use: Yes    Alcohol/week:  0.0 oz    Comment: Rarely  . Drug use: No    Review of Systems Per HPI unless specifically indicated above     Objective:    BP (!) 203/114   Pulse 98   Wt 234 lb (106.1 kg)   SpO2 93%   BMI 41.45 kg/m   Wt Readings from Last 3 Encounters:  05/30/18 234 lb (106.1 kg)  10/16/17 236 lb 11.2 oz (107.4 kg)  10/09/17 242 lb 11.2 oz (110.1 kg)    Physical Exam  Constitutional: He is oriented to person, place, and time. He appears well-developed and well-nourished.  Cardiovascular: Normal rate and regular rhythm.  Pulmonary/Chest: Effort normal and breath sounds normal.  Neurological: He is alert and oriented to person, place, and time.  Skin: Skin is warm and dry.  Psychiatric: He has a normal mood and affect. His behavior is normal.   Results for orders placed or performed in visit on 05/30/18  Comp Met (CMET)  Result Value Ref Range   Glucose 140 (H) 65 - 99 mg/dL   BUN 19 6 - 24 mg/dL   Creatinine, Ser 1.81 (H) 0.76 - 1.27 mg/dL   GFR calc non Af Amer 42 (L) >59 mL/min/1.73   GFR calc Af Amer 49 (L) >59 mL/min/1.73   BUN/Creatinine Ratio 10 9 - 20   Sodium 138 134 - 144 mmol/L   Potassium 4.1 3.5 - 5.2 mmol/L   Chloride 100 96 - 106 mmol/L   CO2 22 20 - 29  mmol/L   Calcium 9.2 8.7 - 10.2 mg/dL   Total Protein 6.8 6.0 - 8.5 g/dL   Albumin 4.5 3.5 - 5.5 g/dL   Globulin, Total 2.3 1.5 - 4.5 g/dL   Albumin/Globulin Ratio 2.0 1.2 - 2.2   Bilirubin Total 1.1 0.0 - 1.2 mg/dL   Alkaline Phosphatase 132 (H) 39 - 117 IU/L   AST 27 0 - 40 IU/L   ALT 14 0 - 44 IU/L  CBC with Differential  Result Value Ref Range   WBC 9.5 3.4 - 10.8 x10E3/uL   RBC 5.10 4.14 - 5.80 x10E6/uL   Hemoglobin 16.5 13.0 - 17.7 g/dL   Hematocrit 47.2 37.5 - 51.0 %   MCV 93 79 - 97 fL   MCH 32.4 26.6 - 33.0 pg   MCHC 35.0 31.5 - 35.7 g/dL   RDW 13.3 12.3 - 15.4 %   Platelets 267 150 - 450 x10E3/uL   Neutrophils 65 Not Estab. %   Lymphs 24 Not Estab. %   Monocytes 9 Not Estab. %   Eos 2 Not  Estab. %   Basos 0 Not Estab. %   Neutrophils Absolute 6.1 1.4 - 7.0 x10E3/uL   Lymphocytes Absolute 2.3 0.7 - 3.1 x10E3/uL   Monocytes Absolute 0.8 0.1 - 0.9 x10E3/uL   EOS (ABSOLUTE) 0.2 0.0 - 0.4 x10E3/uL   Basophils Absolute 0.0 0.0 - 0.2 x10E3/uL   Immature Granulocytes 0 Not Estab. %   Immature Grans (Abs) 0.0 0.0 - 0.1 x10E3/uL  Lipid Profile  Result Value Ref Range   Cholesterol, Total 190 100 - 199 mg/dL   Triglycerides 225 (H) 0 - 149 mg/dL   HDL 36 (L) >39 mg/dL   VLDL Cholesterol Cal 45 (H) 5 - 40 mg/dL   LDL Calculated 109 (H) 0 - 99 mg/dL   Chol/HDL Ratio 5.3 (H) 0.0 - 5.0 ratio  HgB A1c  Result Value Ref Range   Hgb A1c MFr Bld 5.9 (H) 4.8 - 5.6 %   Est. average glucose Bld gHb Est-mCnc 123 mg/dL      Assessment & Plan:   1. Hx of aneurysm  Needs to get back with his providers. Patient states that [facility] told him to have his PCP get an MRI. But last phone conversation in chart form [facility] talks about a CTA as follow up image. [facility] can order this for him.  - Ambulatory referral to Neurosurgery  2. Chronic left shoulder pain  - Ambulatory referral to Orthopedics  3. Chronic obstructive pulmonary disease with acute exacerbation (HCC)  - albuterol (PROVENTIL HFA;VENTOLIN HFA) 108 (90 Base) MCG/ACT inhaler; Inhale 1-2 puffs into the lungs every 6 (six) hours as needed for wheezing or shortness of breath.  Dispense: 18 g; Refill: 3 - budesonide-formoterol (SYMBICORT) 160-4.5 MCG/ACT inhaler; Inhale 2 puffs into the lungs 2 (two) times daily.  Dispense: 1 Inhaler; Refill: 12 - fluticasone (FLONASE) 50 MCG/ACT nasal spray; Place 2 sprays into both nostrils daily.  Dispense: 16 g; Refill: 12  4. Benign hypertensive renal disease  - lisinopril (PRINIVIL,ZESTRIL) 40 MG tablet; Take 1 tablet (40 mg total) by mouth daily.  Dispense: 30 tablet; Refill: 3 - metoprolol succinate (TOPROL-XL) 50 MG 24 hr tablet; Take 2 tablets (100 mg total) by mouth 2 (two) times  daily. Take with or immediately following a meal.  Dispense: 60 tablet; Refill: 3 - amLODipine (NORVASC) 10 MG tablet; Take 1 tablet (10 mg total) by mouth daily.  Dispense: 90 tablet; Refill: 3 -  Comp Met (CMET) - CBC with Differential  5. Prediabetes  - HgB A1c  6. High blood cholesterol  - Lipid Profile   Follow up plan: Return in about 1 week (around 06/06/2018).  Carles Collet, PA-C [facility] 06/05/2018, 4:29 PM

## 2018-05-30 NOTE — Patient Instructions (Addendum)
Refer to [facility] - we have placed this. Refer to [facility] for left shoulder pain    Cerebral Aneurysm An aneurysm is the bulging or ballooning out of part of the weakened wall of a vein or artery. An aneurysm in the vein or artery of the brain is called a brain aneurysm, or cerebral aneurysm. Aneurysms are a risk to your health because they may leak or rupture. Once the aneurysm leaks or ruptures, bleeding occurs. If the bleeding occurs within the brain tissue, the condition is called an intracerebral hemorrhage. An intracerebral hemorrhage can result in a hemorrhagic stroke. If the bleeding occurs in the area between the brain and the thin tissues that cover the brain, the condition is called a subarachnoid hemorrhage. This increases the pressure on the brain and causes some areas of the brain to not get the necessary blood flow. The blood from the ruptured aneurysm collects and presses on the surrounding brain tissue. A subarachnoid hemorrhage can cause a stroke. A ruptured cerebral aneurysm is a medical emergency. This can cause permanent damage and loss of brain function. What are the causes? A cerebral aneurysm is caused when a weakened part of the blood vessel expands. The blood vessel expands due to the constant pressure from the flow of blood through the weakened blood vessel. Usually the aneurysm expands slowly. As the weakened aneurysm expands, the walls of the aneurysm become weaker. Aneurysms may be associated with diseases that weaken and damage the walls of your blood vessels or blood vessels that develop abnormally. Some known causes for cerebral aneurysms are:  Head trauma.  Infection.  Use of "recreational drugs" such as cocaine or amphetamines.  What increases the risk? People at risk for a cerebral aneurysm or hemorrhagic stroke usually have one or more risk factors, which include:  Having high blood pressure (hypertension).  Abusing alcohol.  Having abnormal  blood vessels present since birth.  Having certain bleeding disorders, such as hemophilia, sickle cell disease, or liver disease.  Taking blood thinners (anticoagulants).  Smoking.  Having a family history of aneurysm.  What are the signs or symptoms? The signs and symptoms of an unruptured cerebral aneurysm will partly depend on its size and rate of growth. A small, unchanging aneurysm generally does not produce symptoms. A larger aneurysm that is steadily growing can increase pressure on the brain or nerves. That increased pressure from the unruptured cerebral aneurysm can cause:  A headache.  Problems with your vision.  Numbness or weakness in an arm or leg.  Problems with memory.  Problems speaking.  Seizures.  If an aneurysm leaks or bursts, it can cause a stroke and be life-threatening. Symptoms may include:  A sudden, severe headache with no known cause. The headache is often described as the worst headache ever experienced.  Nausea or vomiting, especially when combined with other symptoms such as a headache.  Sudden weakness or numbness of the face, arm, or leg, especially on one side of the body.  Sudden trouble walking or difficulty moving arms or legs.  Sudden confusion.  Sudden personality changes.  Trouble speaking (aphasia) or understanding.  Difficulty swallowing.  Sudden trouble seeing in one or both eyes.  Double vision.  Dizziness.  Loss of balance or coordination.  Intolerance to light.  Stiff neck.  How is this diagnosed? Your health care provider may use one of the following tests to diagnose your aneurysm:  Computed tomographic angiography (CTA). This test uses dye and a scanner to produce  images of your blood vessels.  Magnetic resonance angiography (MRA). This test uses an MRI machine to produce images of your blood vessels.  Digital subtraction angiography (DSA). This test uses dye and X-rays to take images of your blood vessels.  Your health care provider may use this test to help determine the best course of treatment.  How is this treated? Unruptured Aneurysms Treatment is complex when an aneurysm is found and it is not causing problems. Treatment is very individualized, as each case is different. Many things must be considered, such as the size and exact location of your aneurysm, your age, your overall health, and your feelings and preferences. Small aneurysms in certain locations of the brain have a very low chance of bleeding or rupturing. These small aneurysms may not be treated. However, depending on the size and location of the aneurysm, treatments may be recommended and include:  Coiling. During this procedure, a catheter is inserted and advanced through a blood vessel. Once the catheter reaches the aneurysm, tiny coils are used to block blood flow into the aneurysm.  Surgical clipping. During surgery, a clip is placed at the base of the aneurysm. The clip prevents blood from continuing to enter the aneurysm.  Flow diversion. This procedure is used to divert blood flow around the aneurysm.  Ruptured Aneurysms Immediate emergency surgery may be needed to help prevent damage to the brain and to reduce the risk of rebleeding. Timing of treatment is an important factor in the prevention of complications. Successful early treatment of a ruptured aneurysm (within the first 3 days of a bleed) helps to prevent rebleeding and blood vessel spasm. In some cases, there may be a reason to treat later (10-14 days after a rupture). Many things are considered when making this decision, and each case is handled individually. Follow these instructions at home:  Take medicines only as instructed by your health care provider.  Eat healthy foods. It is recommended that you eat 5 or more servings of fruits and vegetables each day. Foods may need to be a special consistency (soft or pureed), or small bites may need to be taken if you  have had a ruptured aneurysm or stroke. Certain dietary changes may be advised to address high blood pressure, high cholesterol, diabetes, or obesity. ? Food choices that are low in salt (sodium), saturated fat, trans fat, and cholesterol are recommended to manage high blood pressure. ? Food choices that are high in fiber and low in saturated fat, trans fat, and cholesterol are recommended to control cholesterol levels. ? Controlling carbohydrate and sugar intake is recommended to manage diabetes. ? Reducing calorie intake and making food choices that are low in sodium, saturated fat, trans fat, and cholesterol are recommended to manage obesity.  Maintain a healthy weight.  Stay physically active. It is recommended that you get at least 30 minutes of activity on most or all days.  Do not smoke.  Limit alcohol use. Moderate alcohol use is considered to be: ? No more than 2 drinks each day for men. ? No more than 1 drink each day for nonpregnant women.  Stop drug abuse.  A safe home environment is important to reduce the risk of falls. Your health care provider may arrange for specialists to evaluate your home. Having grab bars in the bedroom and bathroom is often important. Your health care provider may arrange for special equipment to be used at home, such as raised toilets and a seat for the shower.  Physical, occupational, and speech therapy. Ongoing therapy may be needed to maximize your recovery after a ruptured aneurysm or stroke. If you have been advised to use a walker or a cane, use it at all times. Be sure to keep your therapy appointments.  Follow all instructions for follow-up with your health care provider. This is very important. This includes any referrals, physical therapy, rehabilitation, and laboratory tests. Proper follow-up may prevent an aneurysm rupture or a stroke. Get help right away if:  You have a sudden, severe headache with no known cause.  You have sudden nausea  or vomiting with a severe headache.  You have sudden weakness or numbness of the face, arm, or leg, especially on one side of the body.  You have sudden trouble walking or difficulty moving arms or legs.  You have sudden confusion.  You have trouble speaking or understanding.  You have sudden trouble seeing in one or both eyes.  You have a sudden loss of balance or coordination.  You have a stiff neck.  You have difficulty breathing.  You have a partial or total loss of consciousness. Any of these symptoms may represent a serious problem that is an emergency. Do not wait to see if the symptoms will go away. Get medical help at once. Call your local emergency services (911 in [place] Do not drive yourself to the hospital. This information is not intended to replace advice given to you by your health care provider. Make sure you discuss any questions you have with your health care provider. Document Released: 08/18/2002 Document Revised: 05/03/2016 Document Reviewed: 05/14/2013 Elsevier Interactive Patient Education  2017 [employer].

## 2018-05-31 LAB — CBC WITH DIFFERENTIAL/PLATELET
Basophils Absolute: 0 10*3/uL (ref 0.0–0.2)
Basos: 0 %
EOS (ABSOLUTE): 0.2 10*3/uL (ref 0.0–0.4)
Eos: 2 %
Hematocrit: 47.2 % (ref 37.5–51.0)
Hemoglobin: 16.5 g/dL (ref 13.0–17.7)
Immature Grans (Abs): 0 10*3/uL (ref 0.0–0.1)
Immature Granulocytes: 0 %
Lymphocytes Absolute: 2.3 10*3/uL (ref 0.7–3.1)
Lymphs: 24 %
MCH: 32.4 pg (ref 26.6–33.0)
MCHC: 35 g/dL (ref 31.5–35.7)
MCV: 93 fL (ref 79–97)
Monocytes Absolute: 0.8 10*3/uL (ref 0.1–0.9)
Monocytes: 9 %
Neutrophils Absolute: 6.1 10*3/uL (ref 1.4–7.0)
Neutrophils: 65 %
Platelets: 267 10*3/uL (ref 150–450)
RBC: 5.1 x10E6/uL (ref 4.14–5.80)
RDW: 13.3 % (ref 12.3–15.4)
WBC: 9.5 10*3/uL (ref 3.4–10.8)

## 2018-05-31 LAB — COMPREHENSIVE METABOLIC PANEL
ALT: 14 IU/L (ref 0–44)
AST: 27 IU/L (ref 0–40)
Albumin/Globulin Ratio: 2 (ref 1.2–2.2)
Albumin: 4.5 g/dL (ref 3.5–5.5)
Alkaline Phosphatase: 132 IU/L — ABNORMAL HIGH (ref 39–117)
BUN/Creatinine Ratio: 10 (ref 9–20)
BUN: 19 mg/dL (ref 6–24)
Bilirubin Total: 1.1 mg/dL (ref 0.0–1.2)
CO2: 22 mmol/L (ref 20–29)
Calcium: 9.2 mg/dL (ref 8.7–10.2)
Chloride: 100 mmol/L (ref 96–106)
Creatinine, Ser: 1.81 mg/dL — ABNORMAL HIGH (ref 0.76–1.27)
GFR calc Af Amer: 49 mL/min/{1.73_m2} — ABNORMAL LOW (ref >59)
GFR calc non Af Amer: 42 mL/min/{1.73_m2} — ABNORMAL LOW (ref >59)
Globulin, Total: 2.3 g/dL (ref 1.5–4.5)
Glucose: 140 mg/dL — ABNORMAL HIGH (ref 65–99)
Potassium: 4.1 mmol/L (ref 3.5–5.2)
Sodium: 138 mmol/L (ref 134–144)
Total Protein: 6.8 g/dL (ref 6.0–8.5)

## 2018-05-31 LAB — LIPID PANEL
Chol/HDL Ratio: 5.3 ratio — ABNORMAL HIGH (ref 0.0–5.0)
Cholesterol, Total: 190 mg/dL (ref 100–199)
HDL: 36 mg/dL — ABNORMAL LOW (ref >39)
LDL Calculated: 109 mg/dL — ABNORMAL HIGH (ref 0–99)
Triglycerides: 225 mg/dL — ABNORMAL HIGH (ref 0–149)
VLDL Cholesterol Cal: 45 mg/dL — ABNORMAL HIGH (ref 5–40)

## 2018-05-31 LAB — HEMOGLOBIN A1C
Est. average glucose Bld gHb Est-mCnc: 123 mg/dL
Hgb A1c MFr Bld: 5.9 % — ABNORMAL HIGH (ref 4.8–5.6)

## 2018-06-06 ENCOUNTER — Ambulatory Visit (INDEPENDENT_AMBULATORY_CARE_PROVIDER_SITE_OTHER): Payer: Medicare Other | Admitting: Physician Assistant

## 2018-06-06 ENCOUNTER — Encounter: Payer: Self-pay | Admitting: Physician Assistant

## 2018-06-06 VITALS — BP 165/55 | HR 80 | Temp 98.2°F | Ht 67.0 in | Wt 238.7 lb

## 2018-06-06 DIAGNOSIS — F331 Major depressive disorder, recurrent, moderate: Secondary | ICD-10-CM

## 2018-06-06 DIAGNOSIS — F419 Anxiety disorder, unspecified: Secondary | ICD-10-CM

## 2018-06-06 DIAGNOSIS — Z8679 Personal history of other diseases of the circulatory system: Secondary | ICD-10-CM | POA: Diagnosis not present

## 2018-06-06 DIAGNOSIS — I129 Hypertensive chronic kidney disease with stage 1 through stage 4 chronic kidney disease, or unspecified chronic kidney disease: Secondary | ICD-10-CM | POA: Diagnosis not present

## 2018-06-06 DIAGNOSIS — N182 Chronic kidney disease, stage 2 (mild): Secondary | ICD-10-CM

## 2018-06-06 NOTE — Progress Notes (Signed)
Subjective:    Patient ID: Derek Binet., male    DOB: Jul 08, 1966, 52 y.o.   MRN: [id number]  Derek Schwanke. is a 52 y.o. male presenting on 06/06/2018 for Hypertension   HPI   Following up for HTN today. He was prescribed his blood pressure medications that he was out of and not taking consistently. Recently re-started Lisinopril and will check CMET. Presents with his sister who is not sure if he is taking these consistently, she will arrange his pill box. His blood pressure today is actually improved.  BP Readings from Last 3 Encounters:  06/06/18 (!) 165/55  05/30/18 (!) 203/114  12/17/17 (!) 228/136   He is asking about refill of anxiety medication today. Last visit I did refill his Cymbalta. He is out of his Klonopin however. He was seen by psychiatry who has filled this for him 01/2018 per EMR. Refill denied on 03/25/2018. He says he hasn't been able to get medication because provider cancelled appointment. Last appointment in EMR 02/17/2018 listed as a no-show. Last in person visit 12/17/2017.   He and his sister state that [facility] has been in touch with them about imaging and says they will recommend and MRI. No contact from this organization to my knowledge. I have reviewed previous telephone note, last documented 2014 from Dr. Alric Seton about follow up appointment with CTA, patient not seen since then.    Social History   Tobacco Use  . Smoking status: Current Every Day Smoker    Packs/day: 2.00    Types: Cigarettes    Start date: 10/11/1984  . Smokeless tobacco: Never Used  Substance Use Topics  . Alcohol use: Yes    Alcohol/week: 0.0 oz    Comment: Rarely  . Drug use: No    Review of Systems Per HPI unless specifically indicated above     Objective:    BP (!) 165/102 (BP Location: Right Arm, Patient Position: Sitting, Cuff Size: Normal)   Pulse 80   Temp 98.2 F (36.8 C) (Oral)   Ht '5\' 7"'  (1.702 m)   Wt 238 lb 11.2 oz (108.3 kg)   SpO2 94%    BMI 37.39 kg/m   Wt Readings from Last 3 Encounters:  06/06/18 238 lb 11.2 oz (108.3 kg)  05/30/18 234 lb (106.1 kg)  12/17/17 241 lb 9.6 oz (109.6 kg)    Physical Exam  Constitutional: He is oriented to person, place, and time. He appears well-developed and well-nourished.  Cardiovascular: Normal rate and regular rhythm.  Pulmonary/Chest: Effort normal and breath sounds normal.  Neurological: He is alert and oriented to person, place, and time.  Skin: Skin is warm and dry.  Psychiatric: He has a normal mood and affect. His behavior is normal.   Results for orders placed or performed in visit on 05/30/18  Comp Met (CMET)  Result Value Ref Range   Glucose 140 (H) 65 - 99 mg/dL   BUN 19 6 - 24 mg/dL   Creatinine, Ser 1.81 (H) 0.76 - 1.27 mg/dL   GFR calc non Af Amer 42 (L) >59 mL/min/1.73   GFR calc Af Amer 49 (L) >59 mL/min/1.73   BUN/Creatinine Ratio 10 9 - 20   Sodium 138 134 - 144 mmol/L   Potassium 4.1 3.5 - 5.2 mmol/L   Chloride 100 96 - 106 mmol/L   CO2 22 20 - 29 mmol/L   Calcium 9.2 8.7 - 10.2 mg/dL   Total Protein 6.8 6.0 -  8.5 g/dL   Albumin 4.5 3.5 - 5.5 g/dL   Globulin, Total 2.3 1.5 - 4.5 g/dL   Albumin/Globulin Ratio 2.0 1.2 - 2.2   Bilirubin Total 1.1 0.0 - 1.2 mg/dL   Alkaline Phosphatase 132 (H) 39 - 117 IU/L   AST 27 0 - 40 IU/L   ALT 14 0 - 44 IU/L  CBC with Differential  Result Value Ref Range   WBC 9.5 3.4 - 10.8 x10E3/uL   RBC 5.10 4.14 - 5.80 x10E6/uL   Hemoglobin 16.5 13.0 - 17.7 g/dL   Hematocrit 47.2 37.5 - 51.0 %   MCV 93 79 - 97 fL   MCH 32.4 26.6 - 33.0 pg   MCHC 35.0 31.5 - 35.7 g/dL   RDW 13.3 12.3 - 15.4 %   Platelets 267 150 - 450 x10E3/uL   Neutrophils 65 Not Estab. %   Lymphs 24 Not Estab. %   Monocytes 9 Not Estab. %   Eos 2 Not Estab. %   Basos 0 Not Estab. %   Neutrophils Absolute 6.1 1.4 - 7.0 x10E3/uL   Lymphocytes Absolute 2.3 0.7 - 3.1 x10E3/uL   Monocytes Absolute 0.8 0.1 - 0.9 x10E3/uL   EOS (ABSOLUTE) 0.2 0.0 - 0.4  x10E3/uL   Basophils Absolute 0.0 0.0 - 0.2 x10E3/uL   Immature Granulocytes 0 Not Estab. %   Immature Grans (Abs) 0.0 0.0 - 0.1 x10E3/uL  Lipid Profile  Result Value Ref Range   Cholesterol, Total 190 100 - 199 mg/dL   Triglycerides 225 (H) 0 - 149 mg/dL   HDL 36 (L) >39 mg/dL   VLDL Cholesterol Cal 45 (H) 5 - 40 mg/dL   LDL Calculated 109 (H) 0 - 99 mg/dL   Chol/HDL Ratio 5.3 (H) 0.0 - 5.0 ratio  HgB A1c  Result Value Ref Range   Hgb A1c MFr Bld 5.9 (H) 4.8 - 5.6 %   Est. average glucose Bld gHb Est-mCnc 123 mg/dL      Assessment & Plan:  1. CKD (chronic kidney disease) stage 2, GFR 60-89 ml/min  Recheck CMET today after starting Lisinopril. BP has improved. Patient still may not be compliant with medication. Will have him follow up with PCP in one month.   - Comp Met (CMET)  2. Chronic anxiety  Have refilled Cymbalta. He has missed appointments with psychiatry and as such his Klonopin has been denied. He has been without this medication for several months, no risk of adverse effects from not refilling. He should follow up with psychiatry for this.   3. Moderate episode of recurrent major depressive disorder ([place])  See #2.   4. Benign hypertensive renal disease  BP better today, continue medications.  5. Hx of aneurysm  He had aneurysm coiled at [facility], saw Dr. Wende Mott. Last image 2013 for coil, no follow up. Patient and his sister say somebody from [facility] has contacted them and told them to get an MRI and that his PCP can get this. Last communication in telephone encounter 2014 talks about getting a CTA. I am unsure what image neurosurgery wants. I will order it if a provider from [facility] tell me exactly what image to order.   Telephone Encounter: I have called [facility] and did not make contact with a person. I have left a message about this patient to determine whether or not [facility] wants me to get this image. I am waiting for a response on this.    Follow up  plan: Return in  about 1 month (around 07/06/2018), or htn.   I have spent 25 minutes with this patient, >50% of which was spent on counseling and coordination of care.   Carles Collet, PA-C [facility] 06/06/2018, 10:06 AM

## 2018-06-07 LAB — COMPREHENSIVE METABOLIC PANEL
ALT: 20 IU/L (ref 0–44)
AST: 19 IU/L (ref 0–40)
Albumin/Globulin Ratio: 1.7 (ref 1.2–2.2)
Albumin: 4.1 g/dL (ref 3.5–5.5)
Alkaline Phosphatase: 131 IU/L — ABNORMAL HIGH (ref 39–117)
BUN/Creatinine Ratio: 12 (ref 9–20)
BUN: 16 mg/dL (ref 6–24)
Bilirubin Total: 0.4 mg/dL (ref 0.0–1.2)
CO2: 25 mmol/L (ref 20–29)
Calcium: 9.4 mg/dL (ref 8.7–10.2)
Chloride: 103 mmol/L (ref 96–106)
Creatinine, Ser: 1.3 mg/dL — ABNORMAL HIGH (ref 0.76–1.27)
GFR calc Af Amer: 73 mL/min/{1.73_m2} (ref >59)
GFR calc non Af Amer: 63 mL/min/{1.73_m2} (ref >59)
Globulin, Total: 2.4 g/dL (ref 1.5–4.5)
Glucose: 126 mg/dL — ABNORMAL HIGH (ref 65–99)
Potassium: 4.5 mmol/L (ref 3.5–5.2)
Sodium: 140 mmol/L (ref 134–144)
Total Protein: 6.5 g/dL (ref 6.0–8.5)

## 2018-06-09 DIAGNOSIS — S46012A Strain of muscle(s) and tendon(s) of the rotator cuff of left shoulder, initial encounter: Secondary | ICD-10-CM | POA: Diagnosis not present

## 2018-07-07 ENCOUNTER — Ambulatory Visit (INDEPENDENT_AMBULATORY_CARE_PROVIDER_SITE_OTHER): Payer: Medicare Other | Admitting: Physician Assistant

## 2018-07-07 ENCOUNTER — Encounter: Payer: Self-pay | Admitting: Physician Assistant

## 2018-07-07 VITALS — BP 173/99 | HR 74 | Temp 98.0°F | Ht 67.0 in | Wt 244.9 lb

## 2018-07-07 DIAGNOSIS — N182 Chronic kidney disease, stage 2 (mild): Secondary | ICD-10-CM | POA: Diagnosis not present

## 2018-07-07 DIAGNOSIS — I129 Hypertensive chronic kidney disease with stage 1 through stage 4 chronic kidney disease, or unspecified chronic kidney disease: Secondary | ICD-10-CM

## 2018-07-07 DIAGNOSIS — Z8679 Personal history of other diseases of the circulatory system: Secondary | ICD-10-CM

## 2018-07-07 DIAGNOSIS — L989 Disorder of the skin and subcutaneous tissue, unspecified: Secondary | ICD-10-CM | POA: Diagnosis not present

## 2018-07-07 DIAGNOSIS — E119 Type 2 diabetes mellitus without complications: Secondary | ICD-10-CM

## 2018-07-07 DIAGNOSIS — Z1211 Encounter for screening for malignant neoplasm of colon: Secondary | ICD-10-CM

## 2018-07-07 NOTE — Progress Notes (Signed)
Subjective:    Patient ID: [name], male    DOB: Jan 13, 1966, 52 y.o.   MRN: [id number]  Cash Duce. is a 52 y.o. male presenting on 07/07/2018 for Hypertension (Follow up) and Skin Lesion (On R side of face)   HPI   HTN: Remains uncontrolled. Sister who presents with patient today is unsure if patient is taking his medications. Currently on amlodipine 10 mg daily, lisinopril 40 mg daily, metoprolol 100 mg twice daily. He has been to see Dr. Candiss Norse before but has not been for a while. Was told it was not his kidneys.   Aneurysm: Schedule to see [facility] 07/23/2018, will need CTA prior to this appointment, this is in the process of being scheduled.   Depression/Anxiety: He is scheduled to see Dr. Shea Evans in psychiatry in August.   DM II: Last A1c 5.9% 05/30/2018. Currently diet controlled. He is due for an eye exam, willing to see [place].   Shoulder Pain: Has seen orthopedist in interim, but will not do MRI until he gets coiled aneurysm imaged.   Colon Cancer Screening: Due, no family history and no prior evidence of polyps.   Social History   Tobacco Use  . Smoking status: Current Every Day Smoker    Packs/day: 2.00    Types: Cigarettes    Start date: 10/11/1984  . Smokeless tobacco: Never Used  Substance Use Topics  . Alcohol use: Yes    Alcohol/week: 0.0 oz    Comment: Rarely  . Drug use: No    Review of Systems Per HPI unless specifically indicated above     Objective:    BP (!) 173/99 (BP Location: Left Arm, Patient Position: Sitting, Cuff Size: Large)   Pulse 74   Temp 98 F (36.7 C) (Tympanic)   Ht '5\' 7"'  (1.702 m)   Wt 244 lb 14.4 oz (111.1 kg)   SpO2 97%   BMI 38.36 kg/m   Wt Readings from Last 3 Encounters:  07/07/18 244 lb 14.4 oz (111.1 kg)  06/06/18 238 lb 11.2 oz (108.3 kg)  05/30/18 234 lb (106.1 kg)    Physical Exam  Constitutional: He is oriented to person, place, and time. He appears well-developed and well-nourished.    Cardiovascular: Normal rate and regular rhythm.  Pulmonary/Chest: Effort normal and breath sounds normal.  Neurological: He is alert and oriented to person, place, and time.  Skin: Skin is warm and dry.     Psychiatric: He has a normal mood and affect. His behavior is normal.   Results for orders placed or performed in visit on 06/06/18  Comp Met (CMET)  Result Value Ref Range   Glucose 126 (H) 65 - 99 mg/dL   BUN 16 6 - 24 mg/dL   Creatinine, Ser 1.30 (H) 0.76 - 1.27 mg/dL   GFR calc non Af Amer 63 >59 mL/min/1.73   GFR calc Af Amer 73 >59 mL/min/1.73   BUN/Creatinine Ratio 12 9 - 20   Sodium 140 134 - 144 mmol/L   Potassium 4.5 3.5 - 5.2 mmol/L   Chloride 103 96 - 106 mmol/L   CO2 25 20 - 29 mmol/L   Calcium 9.4 8.7 - 10.2 mg/dL   Total Protein 6.5 6.0 - 8.5 g/dL   Albumin 4.1 3.5 - 5.5 g/dL   Globulin, Total 2.4 1.5 - 4.5 g/dL   Albumin/Globulin Ratio 1.7 1.2 - 2.2   Bilirubin Total 0.4 0.0 - 1.2 mg/dL   Alkaline Phosphatase 131 (  H) 39 - 117 IU/L   AST 19 0 - 40 IU/L   ALT 20 0 - 44 IU/L      Assessment & Plan:  1. CKD (chronic kidney disease) stage 2, GFR 60-89 ml/min  Question if patient is taking medications, however, blood pressure has not been controlled for years and do feel he needs specialist at this point.   - Ambulatory referral to Nephrology  2. Benign hypertensive renal disease  - Ambulatory referral to Nephrology  3. Hx of aneurysm  He is getting aneurysm imaged by [facility] and will have appointment with them in August.   - Ambulatory referral to Nephrology  4. Skin lesion  Patient with family history of melanoma and longstanding history of sun exposure.   - Ambulatory referral to Dermatology  5. Controlled type 2 diabetes mellitus without complication, without long-term current use of insulin ([place])  Foot exam updated today. Due for eye exam.   - Ambulatory referral to Ophthalmology  6. Colon cancer screening  Agreeable to cologuard.  Explained 3 y f/u if normal and colonoscopy if abnormal.   - Cologuard    Follow up plan: Return in about 3 months (around 10/07/2018) for DM.  Carles Collet, PA-C [facility] 07/07/2018, 2:54 PM

## 2018-07-21 DIAGNOSIS — I10 Essential (primary) hypertension: Secondary | ICD-10-CM | POA: Diagnosis not present

## 2018-07-21 DIAGNOSIS — Z7982 Long term (current) use of aspirin: Secondary | ICD-10-CM | POA: Diagnosis not present

## 2018-07-21 DIAGNOSIS — J449 Chronic obstructive pulmonary disease, unspecified: Secondary | ICD-10-CM | POA: Diagnosis not present

## 2018-07-21 DIAGNOSIS — I671 Cerebral aneurysm, nonruptured: Secondary | ICD-10-CM | POA: Diagnosis not present

## 2018-07-21 DIAGNOSIS — E119 Type 2 diabetes mellitus without complications: Secondary | ICD-10-CM | POA: Diagnosis not present

## 2018-07-23 ENCOUNTER — Ambulatory Visit (INDEPENDENT_AMBULATORY_CARE_PROVIDER_SITE_OTHER): Payer: Medicare Other | Admitting: Psychiatry

## 2018-07-23 ENCOUNTER — Other Ambulatory Visit: Payer: Self-pay

## 2018-07-23 ENCOUNTER — Encounter: Payer: Self-pay | Admitting: Psychiatry

## 2018-07-23 VITALS — BP 198/129 | HR 93 | Temp 97.9°F | Wt 249.6 lb

## 2018-07-23 DIAGNOSIS — F4000 Agoraphobia, unspecified: Secondary | ICD-10-CM

## 2018-07-23 DIAGNOSIS — F17200 Nicotine dependence, unspecified, uncomplicated: Secondary | ICD-10-CM

## 2018-07-23 DIAGNOSIS — F410 Panic disorder [episodic paroxysmal anxiety] without agoraphobia: Secondary | ICD-10-CM | POA: Diagnosis not present

## 2018-07-23 DIAGNOSIS — Z8659 Personal history of other mental and behavioral disorders: Secondary | ICD-10-CM

## 2018-07-23 DIAGNOSIS — F331 Major depressive disorder, recurrent, moderate: Secondary | ICD-10-CM | POA: Diagnosis not present

## 2018-07-23 DIAGNOSIS — F5105 Insomnia due to other mental disorder: Secondary | ICD-10-CM

## 2018-07-23 MED ORDER — HYDROXYZINE PAMOATE 25 MG PO CAPS: 25.0000 mg | ORAL_CAPSULE | Freq: Two times a day (BID) | ORAL | 1 refills | Status: DC | PRN

## 2018-07-23 MED ORDER — DULOXETINE HCL 60 MG PO CPEP: 60.0000 mg | ORAL_CAPSULE | Freq: Two times a day (BID) | ORAL | 1 refills | Status: DC

## 2018-07-23 MED ORDER — MIRTAZAPINE 15 MG PO TABS: 15.0000 mg | ORAL_TABLET | Freq: Every day | ORAL | 1 refills | Status: DC

## 2018-07-23 MED ORDER — CLONAZEPAM 0.5 MG PO TABS: 0.5000 mg | ORAL_TABLET | Freq: Every day | ORAL | 0 refills | Status: DC | PRN
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
## 2018-07-23 NOTE — Patient Instructions (Signed)
Coping with Quitting Smoking Quitting smoking is a physical and mental challenge. You will face cravings, withdrawal symptoms, and temptation. Before quitting, work with your health care provider to make a plan that can help you cope. Preparation can help you quit and keep you from giving in. How can I cope with cravings? Cravings usually last for 5-10 minutes. If you get through it, the craving will pass. Consider taking the following actions to help you cope with cravings:  Keep your mouth busy: ? Chew sugar-free gum. ? Suck on hard candies or a straw. ? Brush your teeth.  Keep your hands and body busy: ? Immediately change to a different activity when you feel a craving. ? Squeeze or play with a ball. ? Do an activity or a hobby, like making bead jewelry, practicing needlepoint, or working with wood. ? Mix up your normal routine. ? Take a short exercise break. Go for a quick walk or run up and down stairs. ? Spend time in public places where smoking is not allowed.  Focus on doing something kind or helpful for someone else.  Call a friend or family member to talk during a craving.  Join a support group.  Call a quit line, such as 1-800-QUIT-NOW.  Talk with your health care provider about medicines that might help you cope with cravings and make quitting easier for you.  How can I deal with withdrawal symptoms? Your body may experience negative effects as it tries to get used to not having nicotine in the system. These effects are called withdrawal symptoms. They may include:  Feeling hungrier than normal.  Trouble concentrating.  Irritability.  Trouble sleeping.  Feeling depressed.  Restlessness and agitation.  Craving a cigarette.  To manage withdrawal symptoms:  Avoid places, people, and activities that trigger your cravings.  Remember why you want to quit.  Get plenty of sleep.  Avoid coffee and other caffeinated drinks. These may worsen some of your  symptoms.  How can I handle social situations? Social situations can be difficult when you are quitting smoking, especially in the first few weeks. To manage this, you can:  Avoid parties, bars, and other social situations where people might be smoking.  Avoid alcohol.  Leave right away if you have the urge to smoke.  Explain to your family and friends that you are quitting smoking. Ask for understanding and support.  Plan activities with friends or family where smoking is not an option.  What are some ways I can cope with stress? Wanting to smoke may cause stress, and stress can make you want to smoke. Find ways to manage your stress. Relaxation techniques can help. For example:  Breathe slowly and deeply, in through your nose and out through your mouth.  Listen to soothing, relaxing music.  Talk with a family member or friend about your stress.  Light a candle.  Soak in a bath or take a shower.  Think about a peaceful place.  What are some ways I can prevent weight gain? Be aware that many people gain weight after they quit smoking. However, not everyone does. To keep from gaining weight, have a plan in place before you quit and stick to the plan after you quit. Your plan should include:  Having healthy snacks. When you have a craving, it may help to: ? Eat plain popcorn, crunchy carrots, celery, or other cut vegetables. ? Chew sugar-free gum.  Changing how you eat: ? Eat small portion sizes at meals. ?   Eat 4-6 small meals throughout the day instead of 1-2 large meals a day. ? Be mindful when you eat. Do not watch television or do other things that might distract you as you eat.  Exercising regularly: ? Make time to exercise each day. If you do not have time for a long workout, do short bouts of exercise for 5-10 minutes several times a day. ? Do some form of strengthening exercise, like weight lifting, and some form of aerobic exercise, like running or  swimming.  Drinking plenty of water or other low-calorie or no-calorie drinks. Drink 6-8 glasses of water daily, or as much as instructed by your health care provider.  Summary  Quitting smoking is a physical and mental challenge. You will face cravings, withdrawal symptoms, and temptation to smoke again. Preparation can help you as you go through these challenges.  You can cope with cravings by keeping your mouth busy (such as by chewing gum), keeping your body and hands busy, and making calls to family, friends, or a helpline for people who want to quit smoking.  You can cope with withdrawal symptoms by avoiding places where people smoke, avoiding drinks with caffeine, and getting plenty of rest.  Ask your health care provider about the different ways to prevent weight gain, avoid stress, and handle social situations. This information is not intended to replace advice given to you by your health care provider. Make sure you discuss any questions you have with your health care provider. Document Released: 11/23/2016 Document Revised: 11/23/2016 Document Reviewed: 11/23/2016 Elsevier Interactive Patient Education  2018 Elsevier Inc.  

## 2018-07-28 ENCOUNTER — Telehealth: Payer: Self-pay

## 2018-07-28 NOTE — Telephone Encounter (Signed)
Copied from [place] [phone]. Topic: Medicare AWV >> Jul 28, 2018  2:53 PM [place], [place] A, LPN wrote: Reason for CRM: Called to reschedule medicare wellness visit, can reschedule OV as well to make same day.  Note: Nurse Health Advisor-Myrel Rappleye,LPN with be on Maternity Leave 10/10/2018-01/02/2019, All Medicare Annual Wellness visits between 10/10/2018 and 01/02/2019 need to be scheduled on a Thursday with the substitute [place]. Please call or skype Jill Alexanders with any questions. [phone].

## 2018-08-20 ENCOUNTER — Ambulatory Visit: Payer: Medicare Other | Admitting: Psychiatry

## 2018-08-22 ENCOUNTER — Other Ambulatory Visit: Payer: Self-pay | Admitting: Orthopedic Surgery

## 2018-08-22 DIAGNOSIS — S46012A Strain of muscle(s) and tendon(s) of the rotator cuff of left shoulder, initial encounter: Secondary | ICD-10-CM

## 2018-08-27 ENCOUNTER — Telehealth: Payer: Self-pay

## 2018-08-27 NOTE — Telephone Encounter (Signed)
Copied from [place] [id number]. Topic: Medicare AWV >> Aug 27, 2018 10:43 AM Bevelyn Ngo, LPN wrote: Reason for CRM: called to reschedule medicare wellness visit, eligible anytime after 10/10/2018.  Note: Nurse Health Advisor-Tiffany Hill,LPN with be on Maternity Leave 10/10/2018-01/02/2019, All Medicare Annual Wellness visits between 10/10/2018 and 01/02/2019 need to be scheduled on a Thursday with the substitute [place]. Please call or skype Jill Alexanders with any questions. [phone].   Reschedule letter sent

## 2018-09-11 ENCOUNTER — Ambulatory Visit: Payer: Medicare Other | Attending: Orthopedic Surgery

## 2018-09-11 ENCOUNTER — Ambulatory Visit: Payer: Medicare Other

## 2018-09-26 ENCOUNTER — Telehealth: Payer: Self-pay | Admitting: Family Medicine

## 2018-09-26 NOTE — Telephone Encounter (Signed)
Please call and remind him about submitting his cologuard sample

## 2018-09-26 NOTE — Telephone Encounter (Signed)
Pt.notified

## 2018-10-08 ENCOUNTER — Ambulatory Visit: Payer: Self-pay | Admitting: Family Medicine

## 2018-10-10 ENCOUNTER — Ambulatory Visit: Payer: Self-pay

## 2018-10-10 ENCOUNTER — Ambulatory Visit: Payer: Self-pay | Admitting: Family Medicine

## 2018-10-13 ENCOUNTER — Ambulatory Visit: Payer: Medicare Other | Admitting: Family Medicine

## 2018-10-14 ENCOUNTER — Encounter: Payer: Self-pay | Admitting: Family Medicine

## 2018-11-11 ENCOUNTER — Telehealth: Payer: Self-pay | Admitting: Psychiatry

## 2018-11-11 NOTE — Telephone Encounter (Signed)
Received letter from [facility] team that patient may be  Noncompliant with meds. Pt last seen in august . Pt needs appointment .

## 2018-12-01 ENCOUNTER — Encounter: Payer: Self-pay | Admitting: Nurse Practitioner

## 2018-12-01 ENCOUNTER — Ambulatory Visit (INDEPENDENT_AMBULATORY_CARE_PROVIDER_SITE_OTHER): Payer: Medicare Other | Admitting: Nurse Practitioner

## 2018-12-01 ENCOUNTER — Other Ambulatory Visit: Payer: Self-pay

## 2018-12-01 VITALS — BP 168/98 | HR 83 | Temp 98.0°F | Ht 66.5 in | Wt 245.0 lb

## 2018-12-01 DIAGNOSIS — I129 Hypertensive chronic kidney disease with stage 1 through stage 4 chronic kidney disease, or unspecified chronic kidney disease: Secondary | ICD-10-CM | POA: Diagnosis not present

## 2018-12-01 DIAGNOSIS — J208 Acute bronchitis due to other specified organisms: Secondary | ICD-10-CM | POA: Insufficient documentation

## 2018-12-01 DIAGNOSIS — J029 Acute pharyngitis, unspecified: Secondary | ICD-10-CM | POA: Diagnosis not present

## 2018-12-01 MED ORDER — PREDNISONE 10 MG PO TABS: 30.0000 mg | ORAL_TABLET | Freq: Every day | ORAL | 0 refills | Status: AC

## 2018-12-01 NOTE — Patient Instructions (Signed)
Chronic Obstructive Pulmonary Disease Chronic obstructive pulmonary disease (COPD) is a long-term (chronic) lung problem. When you have COPD, it is hard for air to get in and out of your lungs. Usually the condition gets worse over time, and your lungs will never return to normal. There are things you can do to keep yourself as healthy as possible.  Your doctor may treat your condition with: ? Medicines. ? Oxygen. ? Lung surgery.  Your doctor may also recommend: ? Rehabilitation. This includes steps to make your body work better. It may involve a team of specialists. ? Quitting smoking, if you smoke. ? Exercise and changes to your diet. ? Comfort measures (palliative care). Follow these instructions at home: Medicines  Take over-the-counter and prescription medicines only as told by your doctor.  Talk to your doctor before taking any cough or allergy medicines. You may need to avoid medicines that cause your lungs to be dry. Lifestyle  If you smoke, stop. Smoking makes the problem worse. If you need help quitting, ask your doctor.  Avoid being around things that make your breathing worse. This may include smoke, chemicals, and fumes.  Stay active, but remember to rest as well.  Learn and use tips on how to relax.  Make sure you get enough sleep. Most adults need at least 7 hours of sleep every night.  Eat healthy foods. Eat smaller meals more often. Rest before meals. Controlled breathing Learn and use tips on how to control your breathing as told by your doctor. Try:  Breathing in (inhaling) through your nose for 1 second. Then, pucker your lips and breath out (exhale) through your lips for 2 seconds.  Putting one hand on your belly (abdomen). Breathe in slowly through your nose for 1 second. Your hand on your belly should move out. Pucker your lips and breathe out slowly through your lips. Your hand on your belly should move in as you breathe out.  Controlled coughing Learn  and use controlled coughing to clear mucus from your lungs. Follow these steps: 1. Lean your head a little forward. 2. Breathe in deeply. 3. Try to hold your breath for 3 seconds. 4. Keep your mouth slightly open while coughing 2 times. 5. Spit any mucus out into a tissue. 6. Rest and do the steps again 1 or 2 times as needed. General instructions  Make sure you get all the shots (vaccines) that your doctor recommends. Ask your doctor about a flu shot and a pneumonia shot.  Use oxygen therapy and pulmonary rehabilitation if told by your doctor. If you need home oxygen therapy, ask your doctor if you should buy a tool to measure your oxygen level (oximeter).  Make a COPD action plan with your doctor. This helps you to know what to do if you feel worse than usual.  Manage any other conditions you have as told by your doctor.  Avoid going outside when it is very hot, cold, or humid.  Avoid people who have a sickness you can catch (contagious).  Keep all follow-up visits as told by your doctor. This is important. Contact a doctor if:  You cough up more mucus than usual.  There is a change in the color or thickness of the mucus.  It is harder to breathe than usual.  Your breathing is faster than usual.  You have trouble sleeping.  You need to use your medicines more often than usual.  You have trouble doing your normal activities such as getting dressed   or walking around the house. Get help right away if:  You have shortness of breath while resting.  You have shortness of breath that stops you from: ? Being able to talk. ? Doing normal activities.  Your chest hurts for longer than 5 minutes.  Your skin color is more blue than usual.  Your pulse oximeter shows that you have low oxygen for longer than 5 minutes.  You have a fever.  You feel too tired to breathe normally. Summary  Chronic obstructive pulmonary disease (COPD) is a long-term lung problem.  The way your  lungs work will never return to normal. Usually the condition gets worse over time. There are things you can do to keep yourself as healthy as possible.  Take over-the-counter and prescription medicines only as told by your doctor.  If you smoke, stop. Smoking makes the problem worse. This information is not intended to replace advice given to you by your health care provider. Make sure you discuss any questions you have with your health care provider. Document Released: 05/14/2008 Document Revised: 12/31/2016 Document Reviewed: 12/31/2016 Elsevier Interactive Patient Education  2019 [employer]  

## 2018-12-01 NOTE — Assessment & Plan Note (Addendum)
Neg strep and flu.  Continue inhalers at home.  Continue Doxycycline daily.  Prednisone 30 MG daily x 5 days sent to pharmacy.  Discussed use of Coricidin HBP for cold symptoms and educated on medications not to use with high BP.  May use plan Mucinex for cough.  Humidifier at home.  Claritin 10 MG daily for nasal symptoms.  Increase fluid intake at home and salt water gargles daily.  Return for worsening or continued symptoms.

## 2018-12-01 NOTE — Progress Notes (Signed)
BP (!) 168/98 (BP Location: Left Arm, Patient Position: Sitting)   Pulse 83   Temp 98 F (36.7 C) (Oral)   Ht 5' 6.5" (1.689 m)   Wt 245 lb (111.1 kg)   SpO2 97%   BMI 38.95 kg/m    Subjective:    Patient ID: Derek Binet., male    DOB: 09/25/1966, 52 y.o.   MRN: [id number]  HPI: Derek Cooley. is a 52 y.o. male presents for acute visit  Chief Complaint  Patient presents with  . Sore Throat    x 2-3 weeks  . Cough  . Nasal Congestion  . Headache   UPPER RESPIRATORY TRACT INFECTION Has had hoarseness and feeling bad for approx. 3 weeks.  Sore throat and hoarseness started first.  Not currently taking anything at home.  He continues to smoke heavily, 1- 2 packs a day.  Discussed at length need for cessation.  At home he does not consistently take BP meds per his sister's report, he endorses he does not take Amlodipine regularly.  Has h/o of noncompliance with BP medications, had at length conversation with him on importance of maintaining BP med regimen to decrease risk factors.  On Doxycycline at home on daily basis due to past abdominal surgeries. Worst symptom: sore throat Fever: no Cough: yes Shortness of breath: yes Wheezing: yes Chest pain: yes, with cough Chest tightness: yes Chest congestion: yes Nasal congestion: yes Runny nose: yes Post nasal drip: yes Sneezing: no Sore throat: yes Swollen glands: no Sinus pressure: yes Headache: yes, intermittent Face pain: no Toothache: no Ear pain: none Ear pressure: none Eyes red/itching:no Eye drainage/crusting: no  Vomiting: no Rash: no Fatigue: yes Sick contacts: yes Strep contacts: no  Context: fluctuating Recurrent sinusitis: no Relief with OTC cold/cough medications: no  Treatments attempted: none   Relevant past medical, surgical, family and social history reviewed and updated as indicated. Interim medical history since our last visit reviewed. Allergies and medications reviewed and  updated.  Review of Systems  Constitutional: Positive for fatigue. Negative for activity change, diaphoresis and fever.  HENT: Positive for congestion, postnasal drip, rhinorrhea and sinus pressure. Negative for ear discharge, ear pain, sinus pain, sneezing and voice change.   Respiratory: Negative for cough, chest tightness, shortness of breath and wheezing.   Cardiovascular: Negative for chest pain, palpitations and leg swelling.  Gastrointestinal: Negative for abdominal distention, abdominal pain, constipation, diarrhea, nausea and vomiting.  Endocrine: Negative for cold intolerance, heat intolerance, polydipsia, polyphagia and polyuria.  Musculoskeletal: Negative.   Skin: Negative.   Neurological: Negative for dizziness, syncope, weakness, light-headedness, numbness and headaches.  Psychiatric/Behavioral: Negative.     Per HPI unless specifically indicated above     Objective:    BP (!) 168/98 (BP Location: Left Arm, Patient Position: Sitting)   Pulse 83   Temp 98 F (36.7 C) (Oral)   Ht 5' 6.5" (1.689 m)   Wt 245 lb (111.1 kg)   SpO2 97%   BMI 38.95 kg/m   Wt Readings from Last 3 Encounters:  12/01/18 245 lb (111.1 kg)  07/07/18 244 lb 14.4 oz (111.1 kg)  06/06/18 238 lb 11.2 oz (108.3 kg)    Physical Exam Vitals signs and nursing note reviewed.  Constitutional:      Appearance: He is well-developed. He is obese. He is ill-appearing.  HENT:     Head: Normocephalic and atraumatic. No raccoon eyes.     Right Ear: Hearing, ear canal and external  ear normal. No drainage. A middle ear effusion is present.     Left Ear: Hearing, ear canal and external ear normal. No drainage. A middle ear effusion is present.     Nose: Mucosal edema and rhinorrhea present. Rhinorrhea is clear.     Right Sinus: No maxillary sinus tenderness or frontal sinus tenderness.     Left Sinus: No maxillary sinus tenderness or frontal sinus tenderness.     Mouth/Throat:     Lips: Pink.     Mouth:  Mucous membranes are moist.     Pharynx: Uvula midline. Posterior oropharyngeal erythema (mild with cobblestoning) present. No pharyngeal swelling or oropharyngeal exudate.  Eyes:     General: Lids are normal.        Right eye: No discharge.        Left eye: No discharge.     Conjunctiva/sclera: Conjunctivae normal.     Pupils: Pupils are equal, round, and reactive to light.  Neck:     Musculoskeletal: Normal range of motion and neck supple.     Thyroid: No thyromegaly.     Vascular: No carotid bruit or JVD.     Trachea: Trachea normal.  Cardiovascular:     Rate and Rhythm: Normal rate and regular rhythm.     Heart sounds: Normal heart sounds, S1 normal and S2 normal. No murmur. No gallop.   Pulmonary:     Effort: Pulmonary effort is normal.     Breath sounds: Examination of the right-upper field reveals wheezing. Examination of the left-upper field reveals wheezing. Wheezing present.     Comments: Upper bases with expiratory wheezes intermittent. Clear throughout remainder.  Hoarseness with talking. Abdominal:     General: Bowel sounds are normal.     Palpations: Abdomen is soft. There is no hepatomegaly or splenomegaly.  Musculoskeletal: Normal range of motion.  Skin:    General: Skin is warm and dry.     Capillary Refill: Capillary refill takes less than 2 seconds.     Findings: No rash.  Neurological:     Mental Status: He is alert and oriented to person, place, and time.     Deep Tendon Reflexes: Reflexes are normal and symmetric.  Psychiatric:        Mood and Affect: Mood normal.        Behavior: Behavior normal.        Thought Content: Thought content normal.        Judgment: Judgment normal.     Results for orders placed or performed in visit on 12/01/18  Rapid Strep Screen ([facility] ONLY)  Result Value Ref Range   Strep Gp A Ag, IA W/Reflex Negative Negative  Culture, Group A Strep  Result Value Ref Range   Strep A Culture WILL FOLLOW   Veritor Flu A/B  Waived  Result Value Ref Range   Influenza A Negative Negative   Influenza B Negative Negative      Assessment & Plan:   Problem List Items Addressed This Visit      Respiratory   Acute bronchitis due to other specified organisms - Primary    Neg strep and flu.  Continue inhalers at home.  Continue Doxycycline daily.  Prednisone 30 MG daily x 5 days sent to pharmacy.  Discussed use of Coricidin HBP for cold symptoms and educated on medications not to use with high BP.  May use plan Mucinex for cough.  Humidifier at home.  Claritin 10 MG daily for nasal symptoms.  Increase fluid intake at home and salt water gargles daily.  Return for worsening or continued symptoms.      Relevant Orders   Veritor Flu A/B Waived (Completed)   Rapid Strep Screen ([facility] ONLY) (Completed)     Genitourinary   Benign hypertensive renal disease    BP improved slightly on exam with manual.  However, remains above goal and pt not consistently taking BP meds.  Discussed at length importance of regular medication and goals of care.  Will not increase doses at this visit, as he has not been taking meds consistently.          Follow up plan: Return if symptoms worsen or fail to improve.

## 2018-12-01 NOTE — Assessment & Plan Note (Signed)
BP improved slightly on exam with manual.  However, remains above goal and pt not consistently taking BP meds.  Discussed at length importance of regular medication and goals of care.  Will not increase doses at this visit, as he has not been taking meds consistently.

## 2018-12-04 LAB — RAPID STREP SCREEN (MED CTR MEBANE ONLY): Strep Gp A Ag, IA W/Reflex: NEGATIVE

## 2018-12-04 LAB — CULTURE, GROUP A STREP: Strep A Culture: NEGATIVE

## 2018-12-04 LAB — VERITOR FLU A/B WAIVED
INFLUENZA B: NEGATIVE
Influenza A: NEGATIVE

## 2018-12-24 ENCOUNTER — Ambulatory Visit (INDEPENDENT_AMBULATORY_CARE_PROVIDER_SITE_OTHER): Payer: Medicare Other | Admitting: Family Medicine

## 2018-12-24 ENCOUNTER — Encounter: Payer: Self-pay | Admitting: Family Medicine

## 2018-12-24 VITALS — BP 160/94 | HR 81 | Temp 98.2°F | Ht 66.5 in | Wt 245.0 lb

## 2018-12-24 DIAGNOSIS — E119 Type 2 diabetes mellitus without complications: Secondary | ICD-10-CM | POA: Diagnosis not present

## 2018-12-24 DIAGNOSIS — F419 Anxiety disorder, unspecified: Secondary | ICD-10-CM | POA: Diagnosis not present

## 2018-12-24 DIAGNOSIS — S39012A Strain of muscle, fascia and tendon of lower back, initial encounter: Secondary | ICD-10-CM | POA: Diagnosis not present

## 2018-12-24 DIAGNOSIS — E7800 Pure hypercholesterolemia, unspecified: Secondary | ICD-10-CM

## 2018-12-24 DIAGNOSIS — I129 Hypertensive chronic kidney disease with stage 1 through stage 4 chronic kidney disease, or unspecified chronic kidney disease: Secondary | ICD-10-CM

## 2018-12-24 MED ORDER — TIZANIDINE HCL 4 MG PO TABS: 4.0000 mg | ORAL_TABLET | Freq: Two times a day (BID) | ORAL | 2 refills | Status: DC | PRN

## 2018-12-24 NOTE — Progress Notes (Signed)
BP (!) 160/94   Pulse 81   Temp 98.2 F (36.8 C) (Oral)   Ht 5' 6.5" (1.689 m)   Wt 245 lb (111.1 kg)   SpO2 97%   BMI 38.95 kg/m    Subjective:    Patient ID: Derek Binet., male    DOB: 11/29/1966, 53 y.o.   MRN: [id number]  HPI: Derek Markgraf. is a 53 y.o. male  Chief Complaint  Patient presents with  . Hypertension  . Hyperlipidemia  . Diabetes   Here today for follow up chronic conditions. Pt is a poor historian and continues to be unsure if he's taking his medicines correctly. His sister tries to prep his medicines for him but even still he states he does not think he's taking things consistently. Denies CP, SOB, hypoglycemia, claudication, myalgias. Does not check home BS or BPs. Not currently on anything for his cholesterol as he had side effects to statins in the past. Does not watch his diet or exercise.   States he was helping a family member with some house projects the other day and having severe low back pain and muscle spasms since. Requesting muscle relaxers and something better than OTC pain relievers.   Followed by Psychiatry for anxiety and depression.   Relevant past medical, surgical, family and social history reviewed and updated as indicated. Interim medical history since our last visit reviewed. Allergies and medications reviewed and updated.  Review of Systems  Per HPI unless specifically indicated above     Objective:    BP (!) 160/94   Pulse 81   Temp 98.2 F (36.8 C) (Oral)   Ht 5' 6.5" (1.689 m)   Wt 245 lb (111.1 kg)   SpO2 97%   BMI 38.95 kg/m   Wt Readings from Last 3 Encounters:  12/24/18 245 lb (111.1 kg)  12/01/18 245 lb (111.1 kg)  07/07/18 244 lb 14.4 oz (111.1 kg)    Physical Exam Vitals signs and nursing note reviewed.  Constitutional:      Appearance: Normal appearance.  HENT:     Head: Atraumatic.  Eyes:     Extraocular Movements: Extraocular movements intact.     Conjunctiva/sclera: Conjunctivae normal.   Neck:     Musculoskeletal: Normal range of motion and neck supple.  Cardiovascular:     Rate and Rhythm: Normal rate and regular rhythm.  Pulmonary:     Effort: Pulmonary effort is normal.     Breath sounds: Normal breath sounds.  Musculoskeletal: Normal range of motion.        General: Tenderness (ttp b/l lumbar paraspinal muscles) present.  Skin:    General: Skin is warm and dry.  Neurological:     General: No focal deficit present.  Psychiatric:        Mood and Affect: Mood normal.        Thought Content: Thought content normal.        Judgment: Judgment normal.     Results for orders placed or performed in visit on 12/24/18  Comprehensive metabolic panel  Result Value Ref Range   Glucose 105 (H) 65 - 99 mg/dL   BUN 20 6 - 24 mg/dL   Creatinine, Ser 1.36 (H) 0.76 - 1.27 mg/dL   GFR calc non Af Amer 59 (L) >59 mL/min/1.73   GFR calc Af Amer 68 >59 mL/min/1.73   BUN/Creatinine Ratio 15 9 - 20   Sodium 141 134 - 144 mmol/L   Potassium 4.8 3.5 -  5.2 mmol/L   Chloride 102 96 - 106 mmol/L   CO2 21 20 - 29 mmol/L   Calcium 9.5 8.7 - 10.2 mg/dL   Total Protein 6.5 6.0 - 8.5 g/dL   Albumin 4.1 3.5 - 5.5 g/dL   Globulin, Total 2.4 1.5 - 4.5 g/dL   Albumin/Globulin Ratio 1.7 1.2 - 2.2   Bilirubin Total 0.3 0.0 - 1.2 mg/dL   Alkaline Phosphatase 115 39 - 117 IU/L   AST 14 0 - 40 IU/L   ALT 14 0 - 44 IU/L  Lipid Panel w/o Chol/HDL Ratio  Result Value Ref Range   Cholesterol, Total 207 (H) 100 - 199 mg/dL   Triglycerides 568 (HH) 0 - 149 mg/dL   HDL 29 (L) >39 mg/dL   VLDL Cholesterol Cal Comment 5 - 40 mg/dL   LDL Calculated Comment 0 - 99 mg/dL  HgB A1c  Result Value Ref Range   Hgb A1c MFr Bld 7.3 (H) 4.8 - 5.6 %   Est. average glucose Bld gHb Est-mCnc 163 mg/dL      Assessment & Plan:   Problem List Items Addressed This Visit      Endocrine   Controlled type 2 diabetes mellitus without complication, without long-term current use of insulin (HCC)    On  metformin, recheck A1C and adjust as needed. Lifestyle modifications reviewed      Relevant Orders   HgB A1c (Completed)     Genitourinary   Benign hypertensive renal disease - Primary    Continues to be above goal. Has been seen by Nephrology for this in the past with no changes made to regimen. Discussed again importance of medication compliance with pt. Will work on faithfully taking medicines for 2 weeks and recheck at that time. Will add medication if still not controlled. DASH diet, stress reduction, exercise as tolerated      Relevant Orders   Comprehensive metabolic panel (Completed)     Other   High blood cholesterol    Will recheck lipids and add medication depending on results      Relevant Orders   Lipid Panel w/o Chol/HDL Ratio (Completed)   Chronic anxiety    Followed by Psychiatry. Continue current regimen       Other Visit Diagnoses    Strain of lumbar region, initial encounter       Tx with flexeril, lidocaine patches, heat, stretches, rest.        Follow up plan: Return in about 2 weeks (around 01/07/2019) for BP f/u.

## 2018-12-24 NOTE — Patient Instructions (Signed)
Call [facility] for follow up   Take blood pressure medicines every single day as directed, follow up in 2 weeks for blood pressure recheck  Use magic mouthwash as needed and also can start some probiotics and salt water gargles

## 2018-12-26 ENCOUNTER — Other Ambulatory Visit: Payer: Self-pay | Admitting: Family Medicine

## 2018-12-26 LAB — COMPREHENSIVE METABOLIC PANEL
ALBUMIN: 4.1 g/dL (ref 3.5–5.5)
ALT: 14 IU/L (ref 0–44)
AST: 14 IU/L (ref 0–40)
Albumin/Globulin Ratio: 1.7 (ref 1.2–2.2)
Alkaline Phosphatase: 115 IU/L (ref 39–117)
BILIRUBIN TOTAL: 0.3 mg/dL (ref 0.0–1.2)
BUN / CREAT RATIO: 15 (ref 9–20)
BUN: 20 mg/dL (ref 6–24)
CHLORIDE: 102 mmol/L (ref 96–106)
CO2: 21 mmol/L (ref 20–29)
CREATININE: 1.36 mg/dL — AB (ref 0.76–1.27)
Calcium: 9.5 mg/dL (ref 8.7–10.2)
GFR calc non Af Amer: 59 mL/min/{1.73_m2} — ABNORMAL LOW (ref >59)
GFR, EST AFRICAN AMERICAN: 68 mL/min/{1.73_m2} (ref >59)
GLOBULIN, TOTAL: 2.4 g/dL (ref 1.5–4.5)
GLUCOSE: 105 mg/dL — AB (ref 65–99)
Potassium: 4.8 mmol/L (ref 3.5–5.2)
SODIUM: 141 mmol/L (ref 134–144)
TOTAL PROTEIN: 6.5 g/dL (ref 6.0–8.5)

## 2018-12-26 LAB — HEMOGLOBIN A1C
ESTIMATED AVERAGE GLUCOSE: 163 mg/dL
HEMOGLOBIN A1C: 7.3 % — AB (ref 4.8–5.6)

## 2018-12-26 LAB — LIPID PANEL W/O CHOL/HDL RATIO
Cholesterol, Total: 207 mg/dL — ABNORMAL HIGH (ref 100–199)
HDL: 29 mg/dL — ABNORMAL LOW (ref >39)
Triglycerides: 568 mg/dL (ref 0–149)

## 2018-12-26 MED ORDER — METFORMIN HCL 500 MG PO TABS: 500.0000 mg | ORAL_TABLET | Freq: Two times a day (BID) | ORAL | 3 refills | Status: DC

## 2018-12-26 MED ORDER — FENOFIBRATE MICRONIZED 67 MG PO CAPS: 67.0000 mg | ORAL_CAPSULE | Freq: Every day | ORAL | 2 refills | Status: DC

## 2019-01-02 DIAGNOSIS — Z789 Other specified health status: Secondary | ICD-10-CM | POA: Insufficient documentation

## 2019-01-02 NOTE — Assessment & Plan Note (Signed)
Will recheck lipids and add medication depending on results

## 2019-01-02 NOTE — Assessment & Plan Note (Signed)
Followed by Psychiatry. Continue current regimen

## 2019-01-02 NOTE — Assessment & Plan Note (Signed)
Continues to be above goal. Has been seen by Nephrology for this in the past with no changes made to regimen. Discussed again importance of medication compliance with pt. Will work on faithfully taking medicines for 2 weeks and recheck at that time. Will add medication if still not controlled. DASH diet, stress reduction, exercise as tolerated

## 2019-01-02 NOTE — Assessment & Plan Note (Signed)
On metformin, recheck A1C and adjust as needed. Lifestyle modifications reviewed

## 2019-01-07 ENCOUNTER — Ambulatory Visit (INDEPENDENT_AMBULATORY_CARE_PROVIDER_SITE_OTHER): Payer: Medicare Other | Admitting: Family Medicine

## 2019-01-07 ENCOUNTER — Encounter: Payer: Self-pay | Admitting: Family Medicine

## 2019-01-07 VITALS — BP 176/109 | HR 72 | Temp 98.1°F | Ht 66.5 in | Wt 245.1 lb

## 2019-01-07 DIAGNOSIS — F419 Anxiety disorder, unspecified: Secondary | ICD-10-CM | POA: Diagnosis not present

## 2019-01-07 DIAGNOSIS — F331 Major depressive disorder, recurrent, moderate: Secondary | ICD-10-CM

## 2019-01-07 DIAGNOSIS — I129 Hypertensive chronic kidney disease with stage 1 through stage 4 chronic kidney disease, or unspecified chronic kidney disease: Secondary | ICD-10-CM

## 2019-01-07 DIAGNOSIS — J441 Chronic obstructive pulmonary disease with (acute) exacerbation: Secondary | ICD-10-CM | POA: Diagnosis not present

## 2019-01-07 MED ORDER — FLUTICASONE-UMECLIDIN-VILANT 100-62.5-25 MCG/INH IN AEPB: 1.0000 | INHALATION_SPRAY | Freq: Every day | RESPIRATORY_TRACT | 5 refills | Status: DC

## 2019-01-07 NOTE — Progress Notes (Signed)
BP (!) 176/109 (BP Location: Right Arm, Patient Position: Sitting, Cuff Size: Large)   Pulse 72   Temp 98.1 F (36.7 C)   Ht 5' 6.5" (1.689 m)   Wt 245 lb 1 oz (111.2 kg)   BMI 38.96 kg/m    Subjective:    Patient ID: Derek Cooley., male    DOB: [birth year], 53 y.o.   MRN: [id number]  HPI: Derek Cooley. is a 53 y.o. male  Chief Complaint  Patient presents with  . Hypertension   Here today for 2 week BP f/u. Pt states he still hasn't been consistently compliant with his medications and home readings not being checked often. Significantly elevated when checked. Denies CP, but has HAs occasionally and SOB on exertion which he attributes to poorly controlled COPD. Still smoking, not watching diet or exercising.   Currently on symbicort and albuterol for his COPD and states he gets winded easily when doing anything physical. Wanting a referral to Pulmonology or to change medicines. Denies CP, syncope, fevers, productive cough. Still smoking about a pack per day.   Currently followed by Psychiatry but wanting a second opinion as he does not feel his anxiety and depression are under good control. No active SI, but feels hopeless a lot of the time and does not often leave the house. Currently on cymbalta, hydroxyzine, klonopin, and remeron.   Does not feel like the prilosec is helping with his GERD. Still having breakthrough sxs.   Past Medical History:  Diagnosis Date  . Abdominal abscess   . Acute pancreatitis   . Anxiety   . Asthma   . Back pain   . Depression   . Diabetes mellitus without complication ([place])   . Emphysema of lung ([place])    right sided  . GERD (gastroesophageal reflux disease)   . Hemorrhage into subarachnoid space of neuraxis ([place]) 01/26/2010  . Hypertension   . Mild cognitive impairment   . MRSA (methicillin resistant Staphylococcus aureus)   . Obesity   . Panic disorder   . Perforated bowel ([place]) 2007   Tempoary Colostomy Bag, Skin Graft for Abd  wound  . Sleep apnea    sleep study 2013  . Subarachnoid hemorrhage ([place]) 2011   coil placed  . Type 2 diabetes mellitus without complication, without long-term current use of insulin ([place]) 04/17/2016  . Vitreous hemorrhage ([place]) 03/27/2011   Overview:  Bilateral; 01/2010, from [facility]    Social History   Socioeconomic History  . Marital status: Divorced    Spouse name: Not on file  . Number of children: Not on file  . Years of education: Not on file  . Highest education level: Not on file  Occupational History  . Not on file  Social Needs  . Financial resource strain: Not on file  . Food insecurity:    Worry: Not on file    Inability: Not on file  . Transportation needs:    Medical: Not on file    Non-medical: Not on file  Tobacco Use  . Smoking status: Current Every Day Smoker    Packs/day: 2.00    Types: Cigarettes    Start date: 10/11/1984  . Smokeless tobacco: Never Used  Substance and Sexual Activity  . Alcohol use: Yes    Alcohol/week: 0.0 standard drinks    Comment: Rarely  . Drug use: No  . Sexual activity: Not Currently  Lifestyle  . Physical activity:    Days per week: Not  on file    Minutes per session: Not on file  . Stress: Not on file  Relationships  . Social connections:    Talks on phone: Not on file    Gets together: Not on file    Attends religious service: Not on file    Active member of club or organization: Not on file    Attends meetings of clubs or organizations: Not on file    Relationship status: Not on file  . Intimate partner violence:    Fear of current or ex partner: Not on file    Emotionally abused: Not on file    Physically abused: Not on file    Forced sexual activity: Not on file  Other Topics Concern  . Not on file  Social History Narrative  . Not on file   Relevant past medical, surgical, family and social history reviewed and updated as indicated. Interim medical history since our last visit reviewed. Allergies and  medications reviewed and updated.  Review of Systems  Per HPI unless specifically indicated above     Objective:    BP (!) 176/109 (BP Location: Right Arm, Patient Position: Sitting, Cuff Size: Large)   Pulse 72   Temp 98.1 F (36.7 C)   Ht 5' 6.5" (1.689 m)   Wt 245 lb 1 oz (111.2 kg)   BMI 38.96 kg/m   Wt Readings from Last 3 Encounters:  01/07/19 245 lb 1 oz (111.2 kg)  12/24/18 245 lb (111.1 kg)  12/01/18 245 lb (111.1 kg)    Physical Exam  Results for orders placed or performed in visit on 12/24/18  Comprehensive metabolic panel  Result Value Ref Range   Glucose 105 (H) 65 - 99 mg/dL   BUN 20 6 - 24 mg/dL   Creatinine, Ser 1.36 (H) 0.76 - 1.27 mg/dL   GFR calc non Af Amer 59 (L) >59 mL/min/1.73   GFR calc Af Amer 68 >59 mL/min/1.73   BUN/Creatinine Ratio 15 9 - 20   Sodium 141 134 - 144 mmol/L   Potassium 4.8 3.5 - 5.2 mmol/L   Chloride 102 96 - 106 mmol/L   CO2 21 20 - 29 mmol/L   Calcium 9.5 8.7 - 10.2 mg/dL   Total Protein 6.5 6.0 - 8.5 g/dL   Albumin 4.1 3.5 - 5.5 g/dL   Globulin, Total 2.4 1.5 - 4.5 g/dL   Albumin/Globulin Ratio 1.7 1.2 - 2.2   Bilirubin Total 0.3 0.0 - 1.2 mg/dL   Alkaline Phosphatase 115 39 - 117 IU/L   AST 14 0 - 40 IU/L   ALT 14 0 - 44 IU/L  Lipid Panel w/o Chol/HDL Ratio  Result Value Ref Range   Cholesterol, Total 207 (H) 100 - 199 mg/dL   Triglycerides 568 (HH) 0 - 149 mg/dL   HDL 29 (L) >39 mg/dL   VLDL Cholesterol Cal Comment 5 - 40 mg/dL   LDL Calculated Comment 0 - 99 mg/dL  HgB A1c  Result Value Ref Range   Hgb A1c MFr Bld 7.3 (H) 4.8 - 5.6 %   Est. average glucose Bld gHb Est-mCnc 163 mg/dL      Assessment & Plan:   Problem List Items Addressed This Visit      Respiratory   COPD (chronic obstructive pulmonary disease) ([place])    Switch to trelegy for better compliance and coverage      Relevant Medications   Fluticasone-Umeclidin-Vilant (TRELEGY ELLIPTA) 100-62.5-25 MCG/INH AEPB     Genitourinary  Benign  hypertensive renal disease    Poor compliance, but still chronically above goal range. Will work hard on compliance (sister plans to pre-pack his medicines to try to help him). Add hydralazine BID and f/u in 2 weeks. Continue home checks, write log of readings        Other   Depression   Relevant Orders   Ambulatory referral to Psychiatry   Chronic anxiety - Primary    Referral placed for second opinion with psychiatry      Relevant Orders   Ambulatory referral to Psychiatry       Follow up plan: Return in about 4 weeks (around 02/04/2019) for BP f/u.

## 2019-01-12 MED ORDER — PANTOPRAZOLE SODIUM 40 MG PO TBEC: 40.0000 mg | DELAYED_RELEASE_TABLET | Freq: Every day | ORAL | 3 refills | Status: DC

## 2019-01-12 MED ORDER — HYDRALAZINE HCL 25 MG PO TABS: 25.0000 mg | ORAL_TABLET | Freq: Two times a day (BID) | ORAL | 0 refills | Status: DC

## 2019-01-14 NOTE — Assessment & Plan Note (Signed)
Referral placed for second opinion with psychiatry

## 2019-01-14 NOTE — Assessment & Plan Note (Signed)
Poor compliance, but still chronically above goal range. Will work hard on compliance (sister plans to pre-pack his medicines to try to help him). Add hydralazine BID and f/u in 2 weeks. Continue home checks, write log of readings

## 2019-01-14 NOTE — Assessment & Plan Note (Signed)
Switch to trelegy for better compliance and coverage

## 2019-01-16 ENCOUNTER — Telehealth: Payer: Self-pay

## 2019-01-16 NOTE — Telephone Encounter (Signed)
Patient wants to know if he can have an antibiotic sent in to [facility] until he sees infectious disease on 12/26/18.

## 2019-01-19 MED ORDER — DOXYCYCLINE HYCLATE 100 MG PO CAPS: 100.0000 mg | ORAL_CAPSULE | Freq: Two times a day (BID) | ORAL | 0 refills | Status: DC

## 2019-01-19 NOTE — Telephone Encounter (Signed)
Rx sent 

## 2019-01-26 DIAGNOSIS — T8579XD Infection and inflammatory reaction due to other internal prosthetic devices, implants and grafts, subsequent encounter: Secondary | ICD-10-CM | POA: Diagnosis not present

## 2019-01-26 DIAGNOSIS — Z792 Long term (current) use of antibiotics: Secondary | ICD-10-CM | POA: Diagnosis not present

## 2019-01-26 DIAGNOSIS — L02211 Cutaneous abscess of abdominal wall: Secondary | ICD-10-CM | POA: Diagnosis not present

## 2019-01-26 DIAGNOSIS — A4902 Methicillin resistant Staphylococcus aureus infection, unspecified site: Secondary | ICD-10-CM | POA: Diagnosis not present

## 2019-01-28 DIAGNOSIS — M7918 Myalgia, other site: Secondary | ICD-10-CM | POA: Diagnosis not present

## 2019-01-28 DIAGNOSIS — G44221 Chronic tension-type headache, intractable: Secondary | ICD-10-CM | POA: Diagnosis not present

## 2019-02-03 ENCOUNTER — Other Ambulatory Visit: Payer: Self-pay | Admitting: Family Medicine

## 2019-02-03 MED ORDER — PANTOPRAZOLE SODIUM 40 MG PO TBEC: 40.0000 mg | DELAYED_RELEASE_TABLET | Freq: Every day | ORAL | 3 refills | Status: DC

## 2019-02-04 ENCOUNTER — Ambulatory Visit: Payer: Medicare Other | Admitting: Family Medicine

## 2019-02-11 ENCOUNTER — Ambulatory Visit (INDEPENDENT_AMBULATORY_CARE_PROVIDER_SITE_OTHER): Payer: Medicare Other | Admitting: Family Medicine

## 2019-02-11 ENCOUNTER — Encounter: Payer: Self-pay | Admitting: Family Medicine

## 2019-02-11 VITALS — BP 179/110 | HR 75 | Temp 98.2°F | Wt 244.0 lb

## 2019-02-11 DIAGNOSIS — E119 Type 2 diabetes mellitus without complications: Secondary | ICD-10-CM

## 2019-02-11 DIAGNOSIS — I129 Hypertensive chronic kidney disease with stage 1 through stage 4 chronic kidney disease, or unspecified chronic kidney disease: Secondary | ICD-10-CM | POA: Diagnosis not present

## 2019-02-11 MED ORDER — HYDROXYZINE HCL 50 MG PO TABS: 50.0000 mg | ORAL_TABLET | Freq: Three times a day (TID) | ORAL | 3 refills | Status: DC | PRN

## 2019-02-11 MED ORDER — METFORMIN HCL ER 500 MG PO TB24: 1000.0000 mg | ORAL_TABLET | Freq: Every day | ORAL | 1 refills | Status: DC

## 2019-02-11 NOTE — Progress Notes (Signed)
BP (!) 179/110   Pulse 75   Temp 98.2 F (36.8 C) (Oral)   Wt 244 lb (110.7 kg)   SpO2 98%   BMI 38.79 kg/m    Subjective:    Patient ID: Derek Cooley., male    DOB: 1966-10-21, 53 y.o.   MRN: [id number]  HPI: Derek Cooley. is a 53 y.o. male  Chief Complaint  Patient presents with  . Follow-up  . Medication Management    metfromin upseting stomach   Here today for 1 month f/u HTN and DM.   Long discussion at last visit about medication compliance particularly with BP regimen given persistent significantly elevated BPs. Pt's sister, who helps him at home, states he stays to himself in the basement and will not bring his medicines up for her to manage and she is unsure what he is taking when. Did not proceed with pill packing at previous visit due to cost and hoping a family member could help improve compliance. Hydralazine was also added at last visit without noticeable improvement. Denies CP but continues to have daily headaches for which he is following closely with Neurology and Neurosurgery currently. Has not been checking home BPs but does have a cuff available.   Started metformin for worsening A1C but states it tears up his stomach so he does not wish to continue taking. Denies low blood sugar spells. Not watching diet carefully.   Past Medical History:  Diagnosis Date  . Abdominal abscess   . Acute pancreatitis   . Anxiety   . Asthma   . Back pain   . Depression   . Diabetes mellitus without complication ([place])   . Emphysema of lung ([place])    right sided  . GERD (gastroesophageal reflux disease)   . Hemorrhage into subarachnoid space of neuraxis ([place]) 01/26/2010  . Hypertension   . Mild cognitive impairment   . MRSA (methicillin resistant Staphylococcus aureus)   . Obesity   . Panic disorder   . Perforated bowel ([place]) 2007   Tempoary Colostomy Bag, Skin Graft for Abd wound  . Sleep apnea    sleep study 2013  . Subarachnoid hemorrhage ([place]) 2011   coil placed  . Type 2 diabetes mellitus without complication, without long-term current use of insulin ([place]) 04/17/2016  . Vitreous hemorrhage ([place]) 03/27/2011   Overview:  Bilateral; 01/2010, from [facility]    Social History   Socioeconomic History  . Marital status: Divorced    Spouse name: Not on file  . Number of children: Not on file  . Years of education: Not on file  . Highest education level: Not on file  Occupational History  . Not on file  Social Needs  . Financial resource strain: Not on file  . Food insecurity:    Worry: Not on file    Inability: Not on file  . Transportation needs:    Medical: Not on file    Non-medical: Not on file  Tobacco Use  . Smoking status: Current Every Day Smoker    Packs/day: 2.00    Types: Cigarettes    Start date: 10/11/1984  . Smokeless tobacco: Never Used  Substance and Sexual Activity  . Alcohol use: Yes    Alcohol/week: 0.0 standard drinks    Comment: Rarely  . Drug use: No  . Sexual activity: Not Currently  Lifestyle  . Physical activity:    Days per week: Not on file    Minutes per session: Not on  file  . Stress: Not on file  Relationships  . Social connections:    Talks on phone: Not on file    Gets together: Not on file    Attends religious service: Not on file    Active member of club or organization: Not on file    Attends meetings of clubs or organizations: Not on file    Relationship status: Not on file  . Intimate partner violence:    Fear of current or ex partner: Not on file    Emotionally abused: Not on file    Physically abused: Not on file    Forced sexual activity: Not on file  Other Topics Concern  . Not on file  Social History Narrative  . Not on file    Relevant past medical, surgical, family and social history reviewed and updated as indicated. Interim medical history since our last visit reviewed. Allergies and medications reviewed and updated.  Review of Systems  Per HPI unless specifically  indicated above     Objective:    BP (!) 179/110   Pulse 75   Temp 98.2 F (36.8 C) (Oral)   Wt 244 lb (110.7 kg)   SpO2 98%   BMI 38.79 kg/m   Wt Readings from Last 3 Encounters:  02/11/19 244 lb (110.7 kg)  01/07/19 245 lb 1 oz (111.2 kg)  12/24/18 245 lb (111.1 kg)    Physical Exam Vitals signs and nursing note reviewed.  Constitutional:      Appearance: Normal appearance.  HENT:     Head: Atraumatic.  Eyes:     Extraocular Movements: Extraocular movements intact.     Conjunctiva/sclera: Conjunctivae normal.  Neck:     Musculoskeletal: Normal range of motion and neck supple.  Cardiovascular:     Rate and Rhythm: Normal rate and regular rhythm.  Pulmonary:     Effort: Pulmonary effort is normal.     Breath sounds: Normal breath sounds.  Musculoskeletal: Normal range of motion.  Skin:    General: Skin is warm and dry.  Neurological:     Mental Status: Mental status is at baseline.  Psychiatric:        Mood and Affect: Mood normal.        Thought Content: Thought content normal.        Judgment: Judgment normal.     Results for orders placed or performed in visit on 12/24/18  Comprehensive metabolic panel  Result Value Ref Range   Glucose 105 (H) 65 - 99 mg/dL   BUN 20 6 - 24 mg/dL   Creatinine, Ser 1.36 (H) 0.76 - 1.27 mg/dL   GFR calc non Af Amer 59 (L) >59 mL/min/1.73   GFR calc Af Amer 68 >59 mL/min/1.73   BUN/Creatinine Ratio 15 9 - 20   Sodium 141 134 - 144 mmol/L   Potassium 4.8 3.5 - 5.2 mmol/L   Chloride 102 96 - 106 mmol/L   CO2 21 20 - 29 mmol/L   Calcium 9.5 8.7 - 10.2 mg/dL   Total Protein 6.5 6.0 - 8.5 g/dL   Albumin 4.1 3.5 - 5.5 g/dL   Globulin, Total 2.4 1.5 - 4.5 g/dL   Albumin/Globulin Ratio 1.7 1.2 - 2.2   Bilirubin Total 0.3 0.0 - 1.2 mg/dL   Alkaline Phosphatase 115 39 - 117 IU/L   AST 14 0 - 40 IU/L   ALT 14 0 - 44 IU/L  Lipid Panel w/o Chol/HDL Ratio  Result Value Ref Range  Cholesterol, Total 207 (H) 100 - 199 mg/dL    Triglycerides 568 (HH) 0 - 149 mg/dL   HDL 29 (L) >39 mg/dL   VLDL Cholesterol Cal Comment 5 - 40 mg/dL   LDL Calculated Comment 0 - 99 mg/dL  HgB A1c  Result Value Ref Range   Hgb A1c MFr Bld 7.3 (H) 4.8 - 5.6 %   Est. average glucose Bld gHb Est-mCnc 163 mg/dL      Assessment & Plan:   Problem List Items Addressed This Visit      Endocrine   Controlled type 2 diabetes mellitus without complication, without long-term current use of insulin (HCC)    Change to XR formulation metformin to reduce side effects. Take with food.       Relevant Medications   metFORMIN (GLUCOPHAGE XR) 500 MG 24 hr tablet     Genitourinary   Benign hypertensive renal disease - Primary    Still significantly elevated. Patient and sister wish to try pill packing to see if this improves compliance. Will refer back to Nephrology if still not improved.           Follow up plan: Return in about 4 weeks (around 03/11/2019) for BP.

## 2019-02-13 DIAGNOSIS — I609 Nontraumatic subarachnoid hemorrhage, unspecified: Secondary | ICD-10-CM | POA: Diagnosis not present

## 2019-02-13 DIAGNOSIS — I719 Aortic aneurysm of unspecified site, without rupture: Secondary | ICD-10-CM | POA: Diagnosis not present

## 2019-02-13 DIAGNOSIS — Z885 Allergy status to narcotic agent status: Secondary | ICD-10-CM | POA: Diagnosis not present

## 2019-02-13 DIAGNOSIS — M1990 Unspecified osteoarthritis, unspecified site: Secondary | ICD-10-CM | POA: Diagnosis not present

## 2019-02-13 DIAGNOSIS — I671 Cerebral aneurysm, nonruptured: Secondary | ICD-10-CM | POA: Diagnosis not present

## 2019-02-13 DIAGNOSIS — I10 Essential (primary) hypertension: Secondary | ICD-10-CM | POA: Diagnosis not present

## 2019-02-13 DIAGNOSIS — J449 Chronic obstructive pulmonary disease, unspecified: Secondary | ICD-10-CM | POA: Diagnosis not present

## 2019-02-13 DIAGNOSIS — E119 Type 2 diabetes mellitus without complications: Secondary | ICD-10-CM | POA: Diagnosis not present

## 2019-02-14 NOTE — Assessment & Plan Note (Signed)
Still significantly elevated. Patient and sister wish to try pill packing to see if this improves compliance. Will refer back to Nephrology if still not improved.

## 2019-02-14 NOTE — Assessment & Plan Note (Signed)
Change to XR formulation metformin to reduce side effects. Take with food.

## 2019-03-18 ENCOUNTER — Ambulatory Visit: Payer: Medicare Other | Admitting: Family Medicine

## 2019-03-18 DIAGNOSIS — Z720 Tobacco use: Secondary | ICD-10-CM | POA: Diagnosis not present

## 2019-03-18 DIAGNOSIS — E1169 Type 2 diabetes mellitus with other specified complication: Secondary | ICD-10-CM | POA: Diagnosis not present

## 2019-03-18 DIAGNOSIS — G4733 Obstructive sleep apnea (adult) (pediatric): Secondary | ICD-10-CM | POA: Diagnosis not present

## 2019-03-18 DIAGNOSIS — J449 Chronic obstructive pulmonary disease, unspecified: Secondary | ICD-10-CM | POA: Diagnosis not present

## 2019-03-18 DIAGNOSIS — I10 Essential (primary) hypertension: Secondary | ICD-10-CM | POA: Diagnosis not present

## 2019-04-13 ENCOUNTER — Other Ambulatory Visit: Payer: Self-pay

## 2019-04-13 NOTE — Patient Outreach (Signed)
[facility]) Care Management  04/13/2019  Ceiba. [birth year] [id number]   Medication Adherence call to Derek Cooley Compliant Voice message left with a call back number. Derek Cooley is showing past due under [name].   [name] Direct Dial [phone]  Fax [phone] [name]

## 2019-04-24 DIAGNOSIS — I10 Essential (primary) hypertension: Secondary | ICD-10-CM | POA: Diagnosis not present

## 2019-04-24 DIAGNOSIS — J449 Chronic obstructive pulmonary disease, unspecified: Secondary | ICD-10-CM | POA: Diagnosis not present

## 2019-04-24 DIAGNOSIS — E119 Type 2 diabetes mellitus without complications: Secondary | ICD-10-CM | POA: Diagnosis not present

## 2019-04-24 DIAGNOSIS — I720 Aneurysm of carotid artery: Secondary | ICD-10-CM | POA: Diagnosis not present

## 2019-05-06 ENCOUNTER — Other Ambulatory Visit: Payer: Self-pay | Admitting: Family Medicine

## 2019-05-06 NOTE — Telephone Encounter (Signed)
Requested medication (s) are due for refill today: yes  Requested medication (s) are on the active medication list: yes  Last refill:  12/26/2018  Future visit scheduled: no  Notes to clinic:  antilipid - fibric acid derivatives failed  Requested Prescriptions  Pending Prescriptions Disp Refills   fenofibrate micronized (LOFIBRA) 67 MG capsule [Pharmacy Med Name: FENOFIBRATE 67MG CAPSULES] 30 capsule 2    Sig: TAKE 1 CAPSULE(67 MG) BY MOUTH DAILY BEFORE BREAKFAST     Cardiovascular:  Antilipid - Fibric Acid Derivatives Failed - 05/06/2019  1:48 PM      Failed - Total Cholesterol in normal range and within 360 days    Cholesterol, Total  Date Value Ref Range Status  12/24/2018 207 (H) 100 - 199 mg/dL Final         Failed - HDL in normal range and within 360 days    HDL  Date Value Ref Range Status  12/24/2018 29 (L) >39 mg/dL Final         Failed - Triglycerides in normal range and within 360 days    Triglycerides  Date Value Ref Range Status  12/24/2018 568 (HH) 0 - 149 mg/dL Final         Failed - Cr in normal range and within 180 days    Creatinine  Date Value Ref Range Status  08/12/2014 1.55 (H) 0.60 - 1.30 mg/dL Final   Creatinine, Ser  Date Value Ref Range Status  12/24/2018 1.36 (H) 0.76 - 1.27 mg/dL Final         Passed - LDL in normal range and within 360 days    LDL Calculated  Date Value Ref Range Status  12/24/2018 Comment 0 - 99 mg/dL Final    Comment:    Triglyceride result indicated is too high for an accurate LDL cholesterol estimation.          Passed - ALT in normal range and within 180 days    ALT  Date Value Ref Range Status  12/24/2018 14 0 - 44 IU/L Final   SGPT (ALT)  Date Value Ref Range Status  08/12/2014 42 U/L Final    Comment:    14-63 NOTE: New Reference Range 06/29/14          Passed - AST in normal range and within 180 days    AST  Date Value Ref Range Status  12/24/2018 14 0 - 40 IU/L Final   SGOT(AST)  Date  Value Ref Range Status  08/12/2014 24 15 - 37 Unit/L Final         Passed - eGFR in normal range and within 180 days    EGFR (African American)  Date Value Ref Range Status  08/12/2014 >60  Final   GFR calc Af Amer  Date Value Ref Range Status  12/24/2018 68 >59 mL/min/1.73 Final   EGFR (Non-African Amer.)  Date Value Ref Range Status  08/12/2014 52 (L)  Final    Comment:    eGFR values <49m/min/1.73 m2 may be an indication of chronic kidney disease (CKD). Calculated eGFR is useful in patients with stable renal function. The eGFR calculation will not be reliable in acutely ill patients when serum creatinine is changing rapidly. It is not useful in  patients on dialysis. The eGFR calculation may not be applicable to patients at the low and high extremes of body sizes, pregnant women, and vegetarians.    GFR calc non Af Amer  Date Value Ref Range Status  12/24/2018  59 (L) >59 mL/min/1.73 Final         Passed - Valid encounter within last 12 months    Recent Outpatient Visits          2 months ago Benign hypertensive renal disease   [facility] Merrie Roof [place], [place]   3 months ago Chronic anxiety   [facility] Merrie Roof [place], [place]   4 months ago Benign hypertensive renal disease   [facility] Merrie Roof [place], [place]   5 months ago Acute bronchitis due to other specified organisms   [place], [place] T, NP   10 months ago CKD (chronic kidney disease) stage 2, GFR 60-89 ml/min   [facility] [place], Wendee Bea[place], [place]

## 2019-05-08 DIAGNOSIS — Z1159 Encounter for screening for other viral diseases: Secondary | ICD-10-CM | POA: Diagnosis not present

## 2019-05-08 DIAGNOSIS — Z419 Encounter for procedure for purposes other than remedying health state, unspecified: Secondary | ICD-10-CM | POA: Diagnosis not present

## 2019-05-08 DIAGNOSIS — I609 Nontraumatic subarachnoid hemorrhage, unspecified: Secondary | ICD-10-CM | POA: Diagnosis not present

## 2019-05-08 DIAGNOSIS — I729 Aneurysm of unspecified site: Secondary | ICD-10-CM | POA: Diagnosis not present

## 2019-05-11 DIAGNOSIS — E119 Type 2 diabetes mellitus without complications: Secondary | ICD-10-CM | POA: Diagnosis not present

## 2019-05-11 DIAGNOSIS — G9389 Other specified disorders of brain: Secondary | ICD-10-CM | POA: Diagnosis not present

## 2019-05-11 DIAGNOSIS — Z8679 Personal history of other diseases of the circulatory system: Secondary | ICD-10-CM | POA: Diagnosis not present

## 2019-05-11 DIAGNOSIS — Z7951 Long term (current) use of inhaled steroids: Secondary | ICD-10-CM | POA: Diagnosis not present

## 2019-05-11 DIAGNOSIS — Z9889 Other specified postprocedural states: Secondary | ICD-10-CM | POA: Diagnosis not present

## 2019-05-11 DIAGNOSIS — R410 Disorientation, unspecified: Secondary | ICD-10-CM | POA: Diagnosis not present

## 2019-05-11 DIAGNOSIS — N179 Acute kidney failure, unspecified: Secondary | ICD-10-CM | POA: Diagnosis not present

## 2019-05-11 DIAGNOSIS — M1990 Unspecified osteoarthritis, unspecified site: Secondary | ICD-10-CM | POA: Diagnosis not present

## 2019-05-11 DIAGNOSIS — K861 Other chronic pancreatitis: Secondary | ICD-10-CM | POA: Diagnosis not present

## 2019-05-11 DIAGNOSIS — R110 Nausea: Secondary | ICD-10-CM | POA: Diagnosis not present

## 2019-05-11 DIAGNOSIS — Z8619 Personal history of other infectious and parasitic diseases: Secondary | ICD-10-CM | POA: Diagnosis not present

## 2019-05-11 DIAGNOSIS — K219 Gastro-esophageal reflux disease without esophagitis: Secondary | ICD-10-CM | POA: Diagnosis not present

## 2019-05-11 DIAGNOSIS — I728 Aneurysm of other specified arteries: Secondary | ICD-10-CM | POA: Diagnosis not present

## 2019-05-11 DIAGNOSIS — Z794 Long term (current) use of insulin: Secondary | ICD-10-CM | POA: Diagnosis not present

## 2019-05-11 DIAGNOSIS — I729 Aneurysm of unspecified site: Secondary | ICD-10-CM | POA: Diagnosis not present

## 2019-05-11 DIAGNOSIS — Z4682 Encounter for fitting and adjustment of non-vascular catheter: Secondary | ICD-10-CM | POA: Diagnosis not present

## 2019-05-11 DIAGNOSIS — Z7409 Other reduced mobility: Secondary | ICD-10-CM | POA: Diagnosis not present

## 2019-05-11 DIAGNOSIS — J698 Pneumonitis due to inhalation of other solids and liquids: Secondary | ICD-10-CM | POA: Diagnosis not present

## 2019-05-11 DIAGNOSIS — R930 Abnormal findings on diagnostic imaging of skull and head, not elsewhere classified: Secondary | ICD-10-CM | POA: Diagnosis not present

## 2019-05-11 DIAGNOSIS — I161 Hypertensive emergency: Secondary | ICD-10-CM | POA: Diagnosis not present

## 2019-05-11 DIAGNOSIS — R918 Other nonspecific abnormal finding of lung field: Secondary | ICD-10-CM | POA: Diagnosis not present

## 2019-05-11 DIAGNOSIS — G3184 Mild cognitive impairment, so stated: Secondary | ICD-10-CM | POA: Diagnosis not present

## 2019-05-11 DIAGNOSIS — R109 Unspecified abdominal pain: Secondary | ICD-10-CM | POA: Diagnosis not present

## 2019-05-11 DIAGNOSIS — G4733 Obstructive sleep apnea (adult) (pediatric): Secondary | ICD-10-CM | POA: Diagnosis not present

## 2019-05-11 DIAGNOSIS — Z7982 Long term (current) use of aspirin: Secondary | ICD-10-CM | POA: Diagnosis not present

## 2019-05-11 DIAGNOSIS — R9431 Abnormal electrocardiogram [ECG] [EKG]: Secondary | ICD-10-CM | POA: Diagnosis not present

## 2019-05-11 DIAGNOSIS — G92 Toxic encephalopathy: Secondary | ICD-10-CM | POA: Diagnosis not present

## 2019-05-11 DIAGNOSIS — L089 Local infection of the skin and subcutaneous tissue, unspecified: Secondary | ICD-10-CM | POA: Diagnosis not present

## 2019-05-11 DIAGNOSIS — G9349 Other encephalopathy: Secondary | ICD-10-CM | POA: Diagnosis not present

## 2019-05-11 DIAGNOSIS — J690 Pneumonitis due to inhalation of food and vomit: Secondary | ICD-10-CM | POA: Diagnosis not present

## 2019-05-11 DIAGNOSIS — J449 Chronic obstructive pulmonary disease, unspecified: Secondary | ICD-10-CM | POA: Diagnosis not present

## 2019-05-11 DIAGNOSIS — I671 Cerebral aneurysm, nonruptured: Secondary | ICD-10-CM | POA: Diagnosis not present

## 2019-05-11 DIAGNOSIS — R339 Retention of urine, unspecified: Secondary | ICD-10-CM | POA: Diagnosis not present

## 2019-05-11 DIAGNOSIS — R34 Anuria and oliguria: Secondary | ICD-10-CM | POA: Diagnosis not present

## 2019-05-11 DIAGNOSIS — G936 Cerebral edema: Secondary | ICD-10-CM | POA: Diagnosis not present

## 2019-05-11 DIAGNOSIS — I609 Nontraumatic subarachnoid hemorrhage, unspecified: Secondary | ICD-10-CM | POA: Diagnosis not present

## 2019-05-11 DIAGNOSIS — R569 Unspecified convulsions: Secondary | ICD-10-CM | POA: Diagnosis not present

## 2019-05-11 DIAGNOSIS — I160 Hypertensive urgency: Secondary | ICD-10-CM | POA: Diagnosis not present

## 2019-05-11 DIAGNOSIS — I10 Essential (primary) hypertension: Secondary | ICD-10-CM | POA: Diagnosis not present

## 2019-05-11 DIAGNOSIS — G9340 Encephalopathy, unspecified: Secondary | ICD-10-CM | POA: Diagnosis not present

## 2019-05-11 DIAGNOSIS — Z7984 Long term (current) use of oral hypoglycemic drugs: Secondary | ICD-10-CM | POA: Diagnosis not present

## 2019-05-14 HISTORY — PX: CEREBRAL ANEURYSM REPAIR: SHX164

## 2019-06-01 DIAGNOSIS — I10 Essential (primary) hypertension: Secondary | ICD-10-CM | POA: Diagnosis not present

## 2019-06-01 DIAGNOSIS — Z48811 Encounter for surgical aftercare following surgery on the nervous system: Secondary | ICD-10-CM | POA: Diagnosis not present

## 2019-06-01 DIAGNOSIS — I609 Nontraumatic subarachnoid hemorrhage, unspecified: Secondary | ICD-10-CM | POA: Diagnosis not present

## 2019-06-01 DIAGNOSIS — Z8679 Personal history of other diseases of the circulatory system: Secondary | ICD-10-CM | POA: Diagnosis not present

## 2019-06-01 DIAGNOSIS — M1990 Unspecified osteoarthritis, unspecified site: Secondary | ICD-10-CM | POA: Diagnosis not present

## 2019-06-01 DIAGNOSIS — J449 Chronic obstructive pulmonary disease, unspecified: Secondary | ICD-10-CM | POA: Diagnosis not present

## 2019-06-01 DIAGNOSIS — E119 Type 2 diabetes mellitus without complications: Secondary | ICD-10-CM | POA: Diagnosis not present

## 2019-06-01 DIAGNOSIS — G4733 Obstructive sleep apnea (adult) (pediatric): Secondary | ICD-10-CM | POA: Diagnosis not present

## 2019-06-10 DIAGNOSIS — Z8679 Personal history of other diseases of the circulatory system: Secondary | ICD-10-CM | POA: Diagnosis not present

## 2019-06-10 DIAGNOSIS — Z48811 Encounter for surgical aftercare following surgery on the nervous system: Secondary | ICD-10-CM | POA: Diagnosis not present

## 2019-06-10 DIAGNOSIS — M1990 Unspecified osteoarthritis, unspecified site: Secondary | ICD-10-CM | POA: Diagnosis not present

## 2019-06-10 DIAGNOSIS — G4733 Obstructive sleep apnea (adult) (pediatric): Secondary | ICD-10-CM | POA: Diagnosis not present

## 2019-06-10 DIAGNOSIS — I609 Nontraumatic subarachnoid hemorrhage, unspecified: Secondary | ICD-10-CM | POA: Diagnosis not present

## 2019-06-10 DIAGNOSIS — E119 Type 2 diabetes mellitus without complications: Secondary | ICD-10-CM | POA: Diagnosis not present

## 2019-06-10 DIAGNOSIS — J449 Chronic obstructive pulmonary disease, unspecified: Secondary | ICD-10-CM | POA: Diagnosis not present

## 2019-06-10 DIAGNOSIS — I10 Essential (primary) hypertension: Secondary | ICD-10-CM | POA: Diagnosis not present

## 2019-06-12 ENCOUNTER — Telehealth: Payer: Self-pay | Admitting: Family Medicine

## 2019-06-12 DIAGNOSIS — G8918 Other acute postprocedural pain: Secondary | ICD-10-CM

## 2019-06-12 NOTE — Telephone Encounter (Signed)
Renee calling.  Pt had brain surgery three weeks ago and is still having pain.  Neurosurgeon states that PCP will have to RX pain medication for pt. Pt uses  [facility] - [place], [place] - [place] [phone] (Phone) [phone] (Fax)

## 2019-06-15 NOTE — Telephone Encounter (Signed)
Urgent referral placed

## 2019-06-15 NOTE — Telephone Encounter (Signed)
Called and notified Joseph Art of what Apolonio Schneiders said. She would like for the urgent referral to be placed.

## 2019-06-15 NOTE — Telephone Encounter (Signed)
Reviewed encounter from Neurosurgery through [facility]. Agree that he will need Pain Management involvement for continued pain medications due to significant risk factors. I will place urgent referral if they would like

## 2019-06-25 ENCOUNTER — Telehealth: Payer: Self-pay

## 2019-06-25 NOTE — Telephone Encounter (Addendum)
Fax received from [facility] requesting refill for Chlorthalidone 25 mg daily. Rx originally from [facility] clinic  Carvedilol 12.5 mg BID

## 2019-06-26 NOTE — Telephone Encounter (Signed)
Virtual visit scheduled for Monday

## 2019-06-26 NOTE — Telephone Encounter (Signed)
I don't have either of these on file for him. Needs appt

## 2019-06-29 ENCOUNTER — Encounter: Payer: Self-pay | Admitting: Family Medicine

## 2019-06-29 ENCOUNTER — Other Ambulatory Visit: Payer: Self-pay

## 2019-06-29 ENCOUNTER — Ambulatory Visit (INDEPENDENT_AMBULATORY_CARE_PROVIDER_SITE_OTHER): Payer: Medicare Other | Admitting: Family Medicine

## 2019-06-29 VITALS — BP 165/99 | HR 92 | Wt 226.4 lb

## 2019-06-29 DIAGNOSIS — E781 Pure hyperglyceridemia: Secondary | ICD-10-CM

## 2019-06-29 DIAGNOSIS — E119 Type 2 diabetes mellitus without complications: Secondary | ICD-10-CM | POA: Diagnosis not present

## 2019-06-29 DIAGNOSIS — I129 Hypertensive chronic kidney disease with stage 1 through stage 4 chronic kidney disease, or unspecified chronic kidney disease: Secondary | ICD-10-CM

## 2019-06-29 DIAGNOSIS — J441 Chronic obstructive pulmonary disease with (acute) exacerbation: Secondary | ICD-10-CM

## 2019-06-29 DIAGNOSIS — F419 Anxiety disorder, unspecified: Secondary | ICD-10-CM

## 2019-06-29 DIAGNOSIS — F331 Major depressive disorder, recurrent, moderate: Secondary | ICD-10-CM

## 2019-06-29 MED ORDER — TRELEGY ELLIPTA 100-62.5-25 MCG/INH IN AEPB: 1.0000 | INHALATION_SPRAY | Freq: Every day | RESPIRATORY_TRACT | 5 refills | Status: DC

## 2019-06-29 MED ORDER — FENOFIBRATE 67 MG PO CAPS: 67.0000 mg | ORAL_CAPSULE | Freq: Every day | ORAL | 1 refills | Status: DC

## 2019-06-29 MED ORDER — CHLORTHALIDONE 25 MG PO TABS: 25.0000 mg | ORAL_TABLET | Freq: Every day | ORAL | 1 refills | Status: DC

## 2019-06-29 MED ORDER — LISINOPRIL 40 MG PO TABS: 40.0000 mg | ORAL_TABLET | Freq: Every day | ORAL | 3 refills | Status: DC

## 2019-06-29 MED ORDER — AMLODIPINE BESYLATE 10 MG PO TABS: 10.0000 mg | ORAL_TABLET | Freq: Every day | ORAL | 3 refills | Status: DC

## 2019-06-29 MED ORDER — CARVEDILOL 12.5 MG PO TABS: 12.5000 mg | ORAL_TABLET | Freq: Two times a day (BID) | ORAL | 5 refills | Status: DC

## 2019-06-29 MED ORDER — PANTOPRAZOLE SODIUM 40 MG PO TBEC: 40.0000 mg | DELAYED_RELEASE_TABLET | Freq: Every day | ORAL | 3 refills | Status: DC

## 2019-06-29 MED ORDER — METFORMIN HCL ER 500 MG PO TB24: 500.0000 mg | ORAL_TABLET | Freq: Two times a day (BID) | ORAL | 5 refills | Status: DC

## 2019-06-29 MED ORDER — HYDRALAZINE HCL 25 MG PO TABS: ORAL_TABLET | ORAL | 5 refills | Status: DC

## 2019-06-29 NOTE — Progress Notes (Signed)
BP (!) 165/99    Pulse 92    Wt 226 lb 6 oz (102.7 kg)    SpO2 98%    BMI 35.99 kg/m    Subjective:    Patient ID: Derek Cooley., male    DOB: 16-Sep-1966, 53 y.o.   MRN: [id number]  HPI: Derek Cooley. is a 53 y.o. male  Chief Complaint  Patient presents with   Hypertension   Hyperlipidemia   Diabetes   COPD     This visit was completed via WebEx due to the restrictions of the COVID-19 pandemic. All issues as above were discussed and addressed. Physical exam was done as above through visual confirmation on WebEx. If it was felt that the patient should be evaluated in the office, they were directed there. The patient verbally consented to this visit.  Location of the patient: home  Location of the provider: home  Those involved with this call:   Provider: Merrie Roof, PA-C  CMA: Tiffany Reel, CMA  Front Desk/Registration: Jill Side   Time spent on call: 30 minutes with patient face to face via video conference. More than 50% of this time was spent in counseling and coordination of care. 10 minutes total spent in review of patient's record and preparation of their chart. I verified patient identity using two factors (patient name and date of birth). Patient consents verbally to being seen via telemedicine visit today.   Patient presenting today for 6 month f/u as well as medication reconcilliation after prolonged hospital stay post-cerebral aneurysm procedure last month.   He is accompanied today by his sister, who provides most of the history for him.   Home BPs running 140s/80s per sister. Currently taking lisinopril, hydralazine, chlorthalidone, coreg, and amlodipine. Tolerating regimen well, compliant with dosing. Denies CP, dizziness, SOB.   Not currently on fenofibrate, which was added several months ago for hypertriglyceridemia. Sister unsure when that was taken off, she thinks maybe during admission. To her knowledge there was no tolerance issues.  Has been trying to eat a bit better. Unable to exercise due to recent surgery and chronic pain issues.   DM - not checking home BSs. Denies polyphagia, polydipsia, polyuria, hypoglycemic episodes. Currently on metformin for this.   Breathing stable on trelegy and albuterol. Was changed to symbicort in hospital instead of trelegy but did better on trelegy. Denies recent exacerbations in his COPD.   Depression and anxiety - managed by Psychiatry, currently on cymbalta and hydroxyzine which seems to be doing well per sister.   Depression screen [facility] 2/9 06/06/2018 05/30/2018 10/09/2017  Decreased Interest 3 0 0  Down, Depressed, Hopeless 3 0 1  PHQ - 2 Score 6 0 1  Altered sleeping 3 - -  Tired, decreased energy 3 - -  Change in appetite 3 - -  Feeling bad or failure about yourself  3 - -  Trouble concentrating 3 - -  Moving slowly or fidgety/restless 2 - -  Suicidal thoughts 0 - -  PHQ-9 Score 23 - -  Difficult doing work/chores Very difficult - -   GAD 7 : Generalized Anxiety Score 06/06/2018  Nervous, Anxious, on Edge 3  Control/stop worrying 2  Worry too much - different things 3  Trouble relaxing 3  Restless 2  Easily annoyed or irritable 2  Afraid - awful might happen 3  Total GAD 7 Score 18  Anxiety Difficulty Very difficult     Relevant past medical, surgical, family and social  history reviewed and updated as indicated. Interim medical history since our last visit reviewed. Allergies and medications reviewed and updated.  Review of Systems  Per HPI unless specifically indicated above     Objective:    BP (!) 165/99    Pulse 92    Wt 226 lb 6 oz (102.7 kg)    SpO2 98%    BMI 35.99 kg/m   Wt Readings from Last 3 Encounters:  06/29/19 226 lb 6 oz (102.7 kg)  02/11/19 244 lb (110.7 kg)  01/07/19 245 lb 1 oz (111.2 kg)    Physical Exam Vitals signs and nursing note reviewed.  Constitutional:      General: He is not in acute distress.    Appearance: Normal  appearance.  HENT:     Head: Atraumatic.     Right Ear: External ear normal.     Left Ear: External ear normal.     Nose: Nose normal. No congestion.     Mouth/Throat:     Mouth: Mucous membranes are moist.     Pharynx: Oropharynx is clear.  Eyes:     Extraocular Movements: Extraocular movements intact.     Conjunctiva/sclera: Conjunctivae normal.  Neck:     Musculoskeletal: Normal range of motion.  Pulmonary:     Effort: Pulmonary effort is normal. No respiratory distress.  Musculoskeletal: Normal range of motion.  Skin:    General: Skin is dry.     Findings: No erythema or rash.  Neurological:     Mental Status: He is oriented to person, place, and time.  Psychiatric:        Mood and Affect: Mood normal.        Thought Content: Thought content normal.        Judgment: Judgment normal.     Results for orders placed or performed in visit on 12/24/18  Comprehensive metabolic panel  Result Value Ref Range   Glucose 105 (H) 65 - 99 mg/dL   BUN 20 6 - 24 mg/dL   Creatinine, Ser 1.36 (H) 0.76 - 1.27 mg/dL   GFR calc non Af Amer 59 (L) >59 mL/min/1.73   GFR calc Af Amer 68 >59 mL/min/1.73   BUN/Creatinine Ratio 15 9 - 20   Sodium 141 134 - 144 mmol/L   Potassium 4.8 3.5 - 5.2 mmol/L   Chloride 102 96 - 106 mmol/L   CO2 21 20 - 29 mmol/L   Calcium 9.5 8.7 - 10.2 mg/dL   Total Protein 6.5 6.0 - 8.5 g/dL   Albumin 4.1 3.5 - 5.5 g/dL   Globulin, Total 2.4 1.5 - 4.5 g/dL   Albumin/Globulin Ratio 1.7 1.2 - 2.2   Bilirubin Total 0.3 0.0 - 1.2 mg/dL   Alkaline Phosphatase 115 39 - 117 IU/L   AST 14 0 - 40 IU/L   ALT 14 0 - 44 IU/L  Lipid Panel w/o Chol/HDL Ratio  Result Value Ref Range   Cholesterol, Total 207 (H) 100 - 199 mg/dL   Triglycerides 568 (HH) 0 - 149 mg/dL   HDL 29 (L) >39 mg/dL   VLDL Cholesterol Cal Comment 5 - 40 mg/dL   LDL Calculated Comment 0 - 99 mg/dL  HgB A1c  Result Value Ref Range   Hgb A1c MFr Bld 7.3 (H) 4.8 - 5.6 %   Est. average glucose Bld  gHb Est-mCnc 163 mg/dL      Assessment & Plan:   Problem List Items Addressed This Visit  Respiratory   COPD (chronic obstructive pulmonary disease) ([place])    Restart trelegy, continue albuterol prn      Relevant Medications   Fluticasone-Umeclidin-Vilant (TRELEGY ELLIPTA) 100-62.5-25 MCG/INH AEPB     Endocrine   Controlled type 2 diabetes mellitus without complication, without long-term current use of insulin (HCC) - Primary    Recheck A1C, adjust as needed. Lifestyle modifications reviewed      Relevant Medications   lisinopril (ZESTRIL) 40 MG tablet   metFORMIN (GLUCOPHAGE XR) 500 MG 24 hr tablet   Other Relevant Orders   HgB A1c     Genitourinary   Benign hypertensive renal disease    Elevated today per their home check, but per sister they've been good typically. Will continue close home monitoring on current regimen and they are to call with persistently elevated readings. DASH diet      Relevant Medications   amLODipine (NORVASC) 10 MG tablet   lisinopril (ZESTRIL) 40 MG tablet   Other Relevant Orders   Comprehensive metabolic panel     Other   Depression    Managed by Psychiatry, continue per their recommendations      Chronic anxiety    Managed by Psychiatry, stable and under good control. Continue per their recommendations      Hypertriglyceridemia    Restart fenofibrate, recheck lipids and adjust as needed.       Relevant Medications   hydrALAZINE (APRESOLINE) 25 MG tablet   fenofibrate micronized (LOFIBRA) 67 MG capsule   chlorthalidone (HYGROTON) 25 MG tablet   carvedilol (COREG) 12.5 MG tablet   amLODipine (NORVASC) 10 MG tablet   lisinopril (ZESTRIL) 40 MG tablet   Other Relevant Orders   Lipid Panel w/o Chol/HDL Ratio       Follow up plan: Return in about 3 months (around 09/29/2019) for DM, Chol, BP f/u.

## 2019-07-01 LAB — HM DIABETES EYE EXAM

## 2019-07-02 ENCOUNTER — Other Ambulatory Visit: Payer: Self-pay | Admitting: Family Medicine

## 2019-07-02 NOTE — Telephone Encounter (Signed)
Tizanidine refill request forwarded to PCP. Last prescribed by historical provider on 02/11/19. LOV 06/29/19

## 2019-07-06 DIAGNOSIS — E781 Pure hyperglyceridemia: Secondary | ICD-10-CM | POA: Insufficient documentation

## 2019-07-06 NOTE — Assessment & Plan Note (Signed)
Recheck A1C, adjust as needed. Lifestyle modifications reviewed 

## 2019-07-06 NOTE — Assessment & Plan Note (Signed)
Elevated today per their home check, but per sister they've been good typically. Will continue close home monitoring on current regimen and they are to call with persistently elevated readings. DASH diet

## 2019-07-06 NOTE — Assessment & Plan Note (Signed)
Restart trelegy, continue albuterol prn

## 2019-07-06 NOTE — Assessment & Plan Note (Signed)
Managed by Psychiatry, continue per their recommendations 

## 2019-07-06 NOTE — Assessment & Plan Note (Signed)
Restart fenofibrate, recheck lipids and adjust as needed.

## 2019-07-06 NOTE — Assessment & Plan Note (Signed)
Managed by Psychiatry, stable and under good control. Continue per their recommendations

## 2019-07-22 ENCOUNTER — Other Ambulatory Visit: Payer: Self-pay | Admitting: Family Medicine

## 2019-07-22 DIAGNOSIS — Z91199 Patient's noncompliance with other medical treatment and regimen due to unspecified reason: Secondary | ICD-10-CM

## 2019-07-22 DIAGNOSIS — Z9119 Patient's noncompliance with other medical treatment and regimen: Secondary | ICD-10-CM

## 2019-07-23 ENCOUNTER — Telehealth: Payer: Self-pay | Admitting: Family Medicine

## 2019-07-23 NOTE — Chronic Care Management (AMB) (Signed)
Chronic Care Management   Note  07/23/2019 Name: Derek Cooley. MRN: [id number] DOB: 04-05-1966  Derek Cooley. is a 53 y.o. year old male who is a primary care patient of [facility], [place]. I reached out to [employer]. by phone today in response to a referral sent by Mr. Gordon Jr.'s health plan.    Derek Cooley was given information about Chronic Care Management services today including:  1. CCM service includes personalized support from designated clinical staff supervised by his physician, including individualized plan of care and coordination with other care providers 2. 24/7 contact phone numbers for assistance for urgent and routine care needs. 3. Service will only be billed when office clinical staff spend 20 minutes or more in a month to coordinate care. 4. Only one practitioner may furnish and bill the service in a calendar month. 5. The patient may stop CCM services at any time (effective at the end of the month) by phone call to the office staff. 6. The patient will be responsible for cost sharing (co-pay) of up to 20% of the service fee (after annual deductible is met).  Patients sister Derek Cooley agreed to services and verbal consent obtained.   Follow up plan: Telephone appointment with CCM team member scheduled for:08/18/2019  [place]  ??[email or web address]   ??[phone]

## 2019-08-01 ENCOUNTER — Other Ambulatory Visit: Payer: Self-pay | Admitting: Family Medicine

## 2019-08-01 NOTE — Telephone Encounter (Signed)
Requested medication (s) are due for refill today: Yes  Requested medication (s) are on the active medication list: Yes  Last refill:  07/03/19  Future visit scheduled: No  Notes to clinic:  See request    Requested Prescriptions  Pending Prescriptions Disp Refills   tiZANidine (ZANAFLEX) 4 MG tablet [Pharmacy Med Name: TIZANIDINE HCL 4 MG TAB] 60 tablet 0    Sig: TAKE ONE TABLET BY MOUTH TWICE DAILY AS NEEDED FOR MUSCLE SPASM     Not Delegated - Cardiovascular:  Alpha-2 Agonists - tizanidine Failed - 08/01/2019 12:02 PM      Failed - This refill cannot be delegated      Passed - Valid encounter within last 6 months    Recent Outpatient Visits          1 month ago Controlled type 2 diabetes mellitus without complication, without long-term current use of insulin [facility])   [place] [place], [place]   5 months ago Benign hypertensive renal disease   [facility] Merrie Roof [place], [place]   6 months ago Chronic anxiety   [facility] Merrie Roof [place], [place]   7 months ago Benign hypertensive renal disease   [facility] Merrie Roof [place], [place]   8 months ago Acute bronchitis due to other specified organisms   [facility] [place], Barbaraann Faster, NP

## 2019-08-03 ENCOUNTER — Ambulatory Visit: Payer: Self-pay | Admitting: Family Medicine

## 2019-08-04 ENCOUNTER — Other Ambulatory Visit: Payer: Self-pay

## 2019-08-04 ENCOUNTER — Encounter: Payer: Self-pay | Admitting: Family Medicine

## 2019-08-04 ENCOUNTER — Ambulatory Visit (INDEPENDENT_AMBULATORY_CARE_PROVIDER_SITE_OTHER): Payer: Medicare Other | Admitting: Family Medicine

## 2019-08-04 VITALS — BP 156/84 | HR 81 | Wt 237.1 lb

## 2019-08-04 DIAGNOSIS — I129 Hypertensive chronic kidney disease with stage 1 through stage 4 chronic kidney disease, or unspecified chronic kidney disease: Secondary | ICD-10-CM | POA: Diagnosis not present

## 2019-08-04 DIAGNOSIS — M5442 Lumbago with sciatica, left side: Secondary | ICD-10-CM

## 2019-08-04 DIAGNOSIS — E119 Type 2 diabetes mellitus without complications: Secondary | ICD-10-CM | POA: Diagnosis not present

## 2019-08-04 DIAGNOSIS — M5441 Lumbago with sciatica, right side: Secondary | ICD-10-CM | POA: Diagnosis not present

## 2019-08-04 MED ORDER — PREDNISONE 20 MG PO TABS: 40.0000 mg | ORAL_TABLET | Freq: Every day | ORAL | 0 refills | Status: DC

## 2019-08-04 MED ORDER — GLIPIZIDE ER 5 MG PO TB24: 5.0000 mg | ORAL_TABLET | Freq: Every day | ORAL | 0 refills | Status: DC

## 2019-08-04 NOTE — Progress Notes (Signed)
BP (!) 156/84    Pulse 81    Wt 237 lb 2 oz (107.6 kg)    BMI 37.70 kg/m    Subjective:    Patient ID: Derek Binet., male    DOB: Feb 07, 1966, 53 y.o.   MRN: [id number]  HPI: Derek Pitter. is a 53 y.o. male  Chief Complaint  Patient presents with   Fatigue   Low blood pressure     This visit was completed via telephone due to the restrictions of the COVID-19 pandemic. All issues as above were discussed and addressed. Physical exam was done as above through visual confirmation on telephone. If it was felt that the patient should be evaluated in the office, they were directed there. The patient verbally consented to this visit.  Location of the patient: home  Location of the provider: home  Those involved with this call:   Provider: Merrie Roof, PA-C  CMA: Tiffany Reel, CMA  Front Desk/Registration: Jill Side   Time spent on call: 25 minutes on the phone discussing health concerns. 10 minutes total spent in review of patient's record and preparation of their chart. I verified patient identity using two factors (patient name and date of birth). Patient consents verbally to being seen via telemedicine visit today.   History provided completely by patient's sister, who is his primary caregiver and controls all of his medications. Fatigue, low blood pressures the past 3 days. BPs dropping nto the 80s/60s at some points, where he usually runs 180s/100 range. There was concern initially for mis-management of his own daily medications so sister has completely controlled his medicine administration the last 2 days and readings are still dropping. Denies syncope, CP, SOB (beyond baseline COPD sxs), fever, recent illness, complaints of urinary sxs. She is not sure if he's hydrating appropriately. Does c/o some mild low back pain above baseline so she is worried about a UTI. She does note that since his brain surgery a few months ago he's been in much less chronic pain and  his moods and anxiety have leveled out completely.   Also she notes he's still having some GI upset with the metformin, wanting to change to something else. Does not check home sugars. No low blood sugar spells she is aware of.   States his breathing is doing fairly well on the trelegy but he does have more of a cough than usual right now and thinks his COPD is acting up.   Relevant past medical, surgical, family and social history reviewed and updated as indicated. Interim medical history since our last visit reviewed. Allergies and medications reviewed and updated.  Review of Systems  Per HPI unless specifically indicated above     Objective:    BP (!) 156/84    Pulse 81    Wt 237 lb 2 oz (107.6 kg)    BMI 37.70 kg/m   Wt Readings from Last 3 Encounters:  08/04/19 237 lb 2 oz (107.6 kg)  06/29/19 226 lb 6 oz (102.7 kg)  02/11/19 244 lb (110.7 kg)    Physical Exam   Unable to perform PE due to patient lack of access to video technology for today's visit  Results for orders placed or performed in visit on 12/24/18  Comprehensive metabolic panel  Result Value Ref Range   Glucose 105 (H) 65 - 99 mg/dL   BUN 20 6 - 24 mg/dL   Creatinine, Ser 1.36 (H) 0.76 - 1.27 mg/dL   GFR calc  non Af Amer 59 (L) >59 mL/min/1.73   GFR calc Af Amer 68 >59 mL/min/1.73   BUN/Creatinine Ratio 15 9 - 20   Sodium 141 134 - 144 mmol/L   Potassium 4.8 3.5 - 5.2 mmol/L   Chloride 102 96 - 106 mmol/L   CO2 21 20 - 29 mmol/L   Calcium 9.5 8.7 - 10.2 mg/dL   Total Protein 6.5 6.0 - 8.5 g/dL   Albumin 4.1 3.5 - 5.5 g/dL   Globulin, Total 2.4 1.5 - 4.5 g/dL   Albumin/Globulin Ratio 1.7 1.2 - 2.2   Bilirubin Total 0.3 0.0 - 1.2 mg/dL   Alkaline Phosphatase 115 39 - 117 IU/L   AST 14 0 - 40 IU/L   ALT 14 0 - 44 IU/L  Lipid Panel w/o Chol/HDL Ratio  Result Value Ref Range   Cholesterol, Total 207 (H) 100 - 199 mg/dL   Triglycerides 568 (HH) 0 - 149 mg/dL   HDL 29 (L) >39 mg/dL   VLDL Cholesterol  Cal Comment 5 - 40 mg/dL   LDL Calculated Comment 0 - 99 mg/dL  HgB A1c  Result Value Ref Range   Hgb A1c MFr Bld 7.3 (H) 4.8 - 5.6 %   Est. average glucose Bld gHb Est-mCnc 163 mg/dL      Assessment & Plan:   Problem List Items Addressed This Visit      Endocrine   Controlled type 2 diabetes mellitus without complication, without long-term current use of insulin (HCC)    Switch to glipizide given GI side effects on metformin. Overdue for recheck A1C. Patient to come in for labs whenever he's available to do so. Continue good lifestyle modifications      Relevant Medications   glipiZIDE (GLUCOTROL XL) 5 MG 24 hr tablet     Genitourinary   Benign hypertensive renal disease    Will d/c hydralazine for now and monitor home BPs closely on remainder of regimen. Stay well hydrated, keep diet steady. Possibly requiring less medication since anxiety and pain has lessened vs chronic medication non-compliance improving with improvement in mental status. Recheck in 2 weeks in person       Other Visit Diagnoses    Acute bilateral low back pain with bilateral sciatica    -  Primary   Requesting x-ray and will add U/A, particularly given recent fatigue sxs. Await results, hydrate well, continue zanaflex, heat prn.    Relevant Medications   predniSONE (DELTASONE) 20 MG tablet   Other Relevant Orders   UA/M w/rflx Culture, Routine   DG Lumbar Spine Complete       Follow up plan: Return in about 2 weeks (around 08/18/2019) for BP, fatigue f/u.

## 2019-08-07 ENCOUNTER — Telehealth: Payer: Self-pay

## 2019-08-07 MED ORDER — IBUPROFEN 800 MG PO TABS: 800.0000 mg | ORAL_TABLET | Freq: Three times a day (TID) | ORAL | 0 refills | Status: DC | PRN

## 2019-08-07 NOTE — Telephone Encounter (Signed)
Patient is requesting a prescription for ibuprofen 800mg  to be sent to [facility]

## 2019-08-16 DIAGNOSIS — I671 Cerebral aneurysm, nonruptured: Secondary | ICD-10-CM | POA: Diagnosis not present

## 2019-08-16 DIAGNOSIS — E1122 Type 2 diabetes mellitus with diabetic chronic kidney disease: Secondary | ICD-10-CM | POA: Diagnosis not present

## 2019-08-16 DIAGNOSIS — J449 Chronic obstructive pulmonary disease, unspecified: Secondary | ICD-10-CM | POA: Diagnosis not present

## 2019-08-16 DIAGNOSIS — R55 Syncope and collapse: Secondary | ICD-10-CM | POA: Diagnosis not present

## 2019-08-16 DIAGNOSIS — N183 Chronic kidney disease, stage 3 (moderate): Secondary | ICD-10-CM | POA: Diagnosis not present

## 2019-08-16 DIAGNOSIS — G4733 Obstructive sleep apnea (adult) (pediatric): Secondary | ICD-10-CM | POA: Diagnosis not present

## 2019-08-16 DIAGNOSIS — R531 Weakness: Secondary | ICD-10-CM | POA: Diagnosis not present

## 2019-08-16 DIAGNOSIS — I959 Hypotension, unspecified: Secondary | ICD-10-CM | POA: Diagnosis not present

## 2019-08-16 DIAGNOSIS — Z833 Family history of diabetes mellitus: Secondary | ICD-10-CM | POA: Diagnosis not present

## 2019-08-16 DIAGNOSIS — S79911A Unspecified injury of right hip, initial encounter: Secondary | ICD-10-CM | POA: Diagnosis not present

## 2019-08-16 DIAGNOSIS — R4182 Altered mental status, unspecified: Secondary | ICD-10-CM | POA: Insufficient documentation

## 2019-08-16 DIAGNOSIS — Z7409 Other reduced mobility: Secondary | ICD-10-CM | POA: Diagnosis not present

## 2019-08-16 DIAGNOSIS — Z7984 Long term (current) use of oral hypoglycemic drugs: Secondary | ICD-10-CM | POA: Diagnosis not present

## 2019-08-16 DIAGNOSIS — R079 Chest pain, unspecified: Secondary | ICD-10-CM | POA: Diagnosis not present

## 2019-08-16 DIAGNOSIS — I129 Hypertensive chronic kidney disease with stage 1 through stage 4 chronic kidney disease, or unspecified chronic kidney disease: Secondary | ICD-10-CM | POA: Diagnosis not present

## 2019-08-16 DIAGNOSIS — E872 Acidosis: Secondary | ICD-10-CM | POA: Diagnosis not present

## 2019-08-16 DIAGNOSIS — N179 Acute kidney failure, unspecified: Secondary | ICD-10-CM | POA: Insufficient documentation

## 2019-08-16 DIAGNOSIS — R0789 Other chest pain: Secondary | ICD-10-CM | POA: Diagnosis not present

## 2019-08-16 DIAGNOSIS — J324 Chronic pansinusitis: Secondary | ICD-10-CM | POA: Diagnosis not present

## 2019-08-16 DIAGNOSIS — R0902 Hypoxemia: Secondary | ICD-10-CM | POA: Diagnosis not present

## 2019-08-16 DIAGNOSIS — M1711 Unilateral primary osteoarthritis, right knee: Secondary | ICD-10-CM | POA: Diagnosis not present

## 2019-08-16 DIAGNOSIS — R0602 Shortness of breath: Secondary | ICD-10-CM | POA: Diagnosis not present

## 2019-08-16 DIAGNOSIS — N189 Chronic kidney disease, unspecified: Secondary | ICD-10-CM | POA: Diagnosis not present

## 2019-08-16 DIAGNOSIS — R6889 Other general symptoms and signs: Secondary | ICD-10-CM | POA: Diagnosis not present

## 2019-08-16 DIAGNOSIS — Z7951 Long term (current) use of inhaled steroids: Secondary | ICD-10-CM | POA: Diagnosis not present

## 2019-08-16 DIAGNOSIS — F1910 Other psychoactive substance abuse, uncomplicated: Secondary | ICD-10-CM | POA: Insufficient documentation

## 2019-08-17 DIAGNOSIS — I959 Hypotension, unspecified: Secondary | ICD-10-CM | POA: Diagnosis not present

## 2019-08-17 DIAGNOSIS — I129 Hypertensive chronic kidney disease with stage 1 through stage 4 chronic kidney disease, or unspecified chronic kidney disease: Secondary | ICD-10-CM | POA: Diagnosis not present

## 2019-08-17 DIAGNOSIS — E1122 Type 2 diabetes mellitus with diabetic chronic kidney disease: Secondary | ICD-10-CM | POA: Diagnosis not present

## 2019-08-17 DIAGNOSIS — R109 Unspecified abdominal pain: Secondary | ICD-10-CM | POA: Diagnosis not present

## 2019-08-17 DIAGNOSIS — N179 Acute kidney failure, unspecified: Secondary | ICD-10-CM | POA: Diagnosis not present

## 2019-08-17 NOTE — Assessment & Plan Note (Addendum)
Will d/c hydralazine for now and monitor home BPs closely on remainder of regimen. Stay well hydrated, keep diet steady. Possibly requiring less medication since anxiety and pain has lessened vs chronic medication non-compliance improving with improvement in mental status. Recheck in 2 weeks in person

## 2019-08-17 NOTE — Assessment & Plan Note (Signed)
Switch to glipizide given GI side effects on metformin. Overdue for recheck A1C. Patient to come in for labs whenever he's available to do so. Continue good lifestyle modifications

## 2019-08-18 ENCOUNTER — Ambulatory Visit: Payer: Medicare Other | Admitting: Pharmacist

## 2019-08-18 DIAGNOSIS — E1122 Type 2 diabetes mellitus with diabetic chronic kidney disease: Secondary | ICD-10-CM | POA: Diagnosis not present

## 2019-08-18 DIAGNOSIS — N183 Chronic kidney disease, stage 3 (moderate): Secondary | ICD-10-CM | POA: Diagnosis not present

## 2019-08-18 DIAGNOSIS — N179 Acute kidney failure, unspecified: Secondary | ICD-10-CM | POA: Diagnosis not present

## 2019-08-18 DIAGNOSIS — I129 Hypertensive chronic kidney disease with stage 1 through stage 4 chronic kidney disease, or unspecified chronic kidney disease: Secondary | ICD-10-CM | POA: Diagnosis not present

## 2019-08-18 MED ORDER — EQL PEDIATRIC ELECTROLYTE PO SOLN
0.40 | ORAL | Status: DC
Start: 2019-08-18 — End: 2019-08-18

## 2019-08-18 MED ORDER — VICON FORTE PO CAPS
17.00 | ORAL_CAPSULE | ORAL | Status: DC
Start: 2019-08-19 — End: 2019-08-18

## 2019-08-18 MED ORDER — CELLULOSE SODIUM PHOSPHATE VI
5000.00 | Status: DC
Start: 2019-08-18 — End: 2019-08-18

## 2019-08-18 MED ORDER — BAYER WOMENS 81-300 MG PO TABS
40.00 | ORAL_TABLET | ORAL | Status: DC
Start: 2019-08-19 — End: 2019-08-18

## 2019-08-18 MED ORDER — HYOSCYAMINE SULFATE SL 0.125 MG SL SUBL
1.00 | SUBLINGUAL_TABLET | SUBLINGUAL | Status: DC
Start: 2019-08-19 — End: 2019-08-18

## 2019-08-18 MED ORDER — QUINERVA 260 MG PO TABS
650.00 | ORAL_TABLET | ORAL | Status: DC
Start: ? — End: 2019-08-18

## 2019-08-18 MED ORDER — INSULIN LISPRO 100 UNIT/ML ~~LOC~~ SOLN
0.00 | SUBCUTANEOUS | Status: DC
Start: 2019-08-18 — End: 2019-08-18

## 2019-08-18 MED ORDER — X-5 THERAPEUTIC EX
2.00 | CUTANEOUS | Status: DC
Start: 2019-08-19 — End: 2019-08-18

## 2019-08-18 MED ORDER — BENICAR 20 MG PO TABS
12.50 | ORAL_TABLET | ORAL | Status: DC
Start: ? — End: 2019-08-18

## 2019-08-18 MED ORDER — Medication
10.00 | Status: DC
Start: 2019-08-19 — End: 2019-08-18

## 2019-08-18 MED ORDER — ACCU-PRO PUMP SET/VENT MISC
2.50 | Status: DC
Start: ? — End: 2019-08-18

## 2019-08-18 MED ORDER — BRONTUSS DX 10-20-200 MG/5ML PO LIQD
5.00 | ORAL | Status: DC
Start: 2019-08-18 — End: 2019-08-18

## 2019-08-18 MED ORDER — ALBA-3 EX
400.00 | CUTANEOUS | Status: DC
Start: 2019-08-19 — End: 2019-08-18

## 2019-08-18 MED ORDER — TARON A 35-1 & 300 MG PO MISC
60.00 | ORAL | Status: DC
Start: 2019-08-18 — End: 2019-08-18

## 2019-08-18 MED ORDER — ASSURE II CONTROL LEVEL 1 & 2 VI
12.50 | Status: DC
Start: 2019-08-18 — End: 2019-08-18

## 2019-08-18 MED ORDER — ALPROSTADIL (VASODILATOR) IC
50.00 | INTRACAVERNOUS | Status: DC
Start: 2019-08-18 — End: 2019-08-18

## 2019-08-18 MED ORDER — GENERIC EXTERNAL MEDICATION
100.00 | Status: DC
Start: 2019-08-19 — End: 2019-08-18

## 2019-08-18 MED ORDER — GENTAK 0.3 % OP SOLN
1.00 | OPHTHALMIC | Status: DC
Start: 2019-08-19 — End: 2019-08-18

## 2019-08-18 NOTE — Chronic Care Management (AMB) (Signed)
  Chronic Care Management   Note  08/18/2019 Name: Derek Cooley. MRN: [id number] DOB: 10/07/1966  Derek Cooley. is a 53 y.o. year old male who is a primary care patient of [facility], [place]. The CCM team was consulted for assistance with chronic disease management and care coordination needs.    Patient originally scheduled CCM phone call with pharmacist for medication management review, however, when I called today, his sister answered and noted that they were just getting home from his obs stay at [facility]. She requested a call back later in the afternoon, however, didn't answer at the agreed upon time. Left HIPAA compliant message requesting return call.   Follow up plan: - If I do not hear back, will outreach again within the next week  Catie Darnelle Maffucci, PharmD Clinical Pharmacist [place] [phone]

## 2019-08-19 ENCOUNTER — Telehealth: Payer: Self-pay

## 2019-08-21 ENCOUNTER — Ambulatory Visit (INDEPENDENT_AMBULATORY_CARE_PROVIDER_SITE_OTHER): Payer: Medicare Other | Admitting: Family Medicine

## 2019-08-21 ENCOUNTER — Other Ambulatory Visit: Payer: Self-pay

## 2019-08-21 ENCOUNTER — Ambulatory Visit: Payer: Self-pay | Admitting: Family Medicine

## 2019-08-21 ENCOUNTER — Encounter: Payer: Self-pay | Admitting: Family Medicine

## 2019-08-21 VITALS — BP 119/80 | HR 80 | Temp 98.1°F | Ht 66.5 in | Wt 238.0 lb

## 2019-08-21 DIAGNOSIS — M25512 Pain in left shoulder: Secondary | ICD-10-CM

## 2019-08-21 DIAGNOSIS — T50901D Poisoning by unspecified drugs, medicaments and biological substances, accidental (unintentional), subsequent encounter: Secondary | ICD-10-CM

## 2019-08-21 DIAGNOSIS — M25561 Pain in right knee: Secondary | ICD-10-CM | POA: Diagnosis not present

## 2019-08-21 DIAGNOSIS — F419 Anxiety disorder, unspecified: Secondary | ICD-10-CM

## 2019-08-21 DIAGNOSIS — R32 Unspecified urinary incontinence: Secondary | ICD-10-CM

## 2019-08-21 DIAGNOSIS — I129 Hypertensive chronic kidney disease with stage 1 through stage 4 chronic kidney disease, or unspecified chronic kidney disease: Secondary | ICD-10-CM | POA: Diagnosis not present

## 2019-08-21 DIAGNOSIS — G8929 Other chronic pain: Secondary | ICD-10-CM

## 2019-08-21 DIAGNOSIS — Z23 Encounter for immunization: Secondary | ICD-10-CM

## 2019-08-21 DIAGNOSIS — R109 Unspecified abdominal pain: Secondary | ICD-10-CM

## 2019-08-21 MED ORDER — LISINOPRIL 10 MG PO TABS: 10.0000 mg | ORAL_TABLET | Freq: Every day | ORAL | 1 refills | Status: DC

## 2019-08-21 NOTE — Progress Notes (Signed)
BP 119/80   Pulse 80   Temp 98.1 F (36.7 C) (Oral)   Ht 5' 6.5" (1.689 m)   Wt 238 lb (108 kg)   SpO2 98%   BMI 37.84 kg/m    Subjective:    Patient ID: Derek Cooley., male    DOB: 05-04-1966, 53 y.o.   MRN: [id number]  HPI: Derek Cooley. is a 53 y.o. male  Chief Complaint  Patient presents with  . Hospitalization Follow-up  . Hypertension   Patient here today for hospital f/u for a cocaine overdose mixed with alcohol intoxication. Was found to be non-responsive at home by his siblings and EMS was called. His BPs were incredibly low on presentation. All BP medications were stopped during admission and he was instructed to continue d/c upon discharge. At time of discharge he was mostly back to baseline mental status and feeling some better. Was given a librium taper to go home with which he reports taking. Notes that the drugs were a big mistake and he does not want to ever do that again.  BPs remain low/low normal since being back home with the occasional spike back to 160/90s-100 range. Denies current CP, SOB.   Was given tamsulosin because he was having issues urinating, and now having incontinence issues throughout the day from not being able to make it to the bathroom.   Anxiety is high since all of this has gone on and from his chronic pain being poorly controlled. Does see Psychiatry but has not been in quite some time. Overdue for f/u. Taking hydroxyzine prn and cymbalta which does help some. Denies SI/HI, and notes self harm was not involved in his overdose and that that was accidental.   Right knee, left shoulder and abdominal pain still severe. Wanting new referral to Pain Management for these issues. These are all chronic. The abdominal pain is from a traumatic injury, has a mesh in place from surgery to abdomen and has to be on chronic abx through ID for this.   Depression screen [facility] 2/9 06/06/2018 05/30/2018 10/09/2017  Decreased Interest 3 0 0  Down,  Depressed, Hopeless 3 0 1  PHQ - 2 Score 6 0 1  Altered sleeping 3 - -  Tired, decreased energy 3 - -  Change in appetite 3 - -  Feeling bad or failure about yourself  3 - -  Trouble concentrating 3 - -  Moving slowly or fidgety/restless 2 - -  Suicidal thoughts 0 - -  PHQ-9 Score 23 - -  Difficult doing work/chores Very difficult - -   GAD 7 : Generalized Anxiety Score 06/06/2018  Nervous, Anxious, on Edge 3  Control/stop worrying 2  Worry too much - different things 3  Trouble relaxing 3  Restless 2  Easily annoyed or irritable 2  Afraid - awful might happen 3  Total GAD 7 Score 18  Anxiety Difficulty Very difficult   Relevant past medical, surgical, family and social history reviewed and updated as indicated. Interim medical history since our last visit reviewed. Allergies and medications reviewed and updated.  Review of Systems  Per HPI unless specifically indicated above     Objective:    BP 119/80   Pulse 80   Temp 98.1 F (36.7 C) (Oral)   Ht 5' 6.5" (1.689 m)   Wt 238 lb (108 kg)   SpO2 98%   BMI 37.84 kg/m   Wt Readings from Last 3 Encounters:  08/21/19 238  lb (108 kg)  08/04/19 237 lb 2 oz (107.6 kg)  06/29/19 226 lb 6 oz (102.7 kg)    Physical Exam Vitals signs and nursing note reviewed.  Constitutional:      Appearance: Normal appearance.  HENT:     Head: Atraumatic.     Nose: Nose normal.     Mouth/Throat:     Mouth: Mucous membranes are moist.     Pharynx: Oropharynx is clear.  Eyes:     Extraocular Movements: Extraocular movements intact.     Conjunctiva/sclera: Conjunctivae normal.  Neck:     Musculoskeletal: Normal range of motion and neck supple.  Cardiovascular:     Rate and Rhythm: Normal rate and regular rhythm.  Pulmonary:     Effort: Pulmonary effort is normal.     Breath sounds: Wheezing (minimal diffuse wheezes, baseline for patient) present.  Musculoskeletal: Normal range of motion.  Skin:    General: Skin is warm and dry.   Neurological:     Mental Status: Mental status is at baseline.  Psychiatric:        Mood and Affect: Mood normal.        Thought Content: Thought content normal.        Judgment: Judgment normal.     Results for orders placed or performed in visit on 123456  Basic metabolic panel  Result Value Ref Range   Glucose 99 65 - 99 mg/dL   BUN 22 6 - 24 mg/dL   Creatinine, Ser 1.77 (H) 0.76 - 1.27 mg/dL   GFR calc non Af Amer 43 (L) >59 mL/min/1.73   GFR calc Af Amer 50 (L) >59 mL/min/1.73   BUN/Creatinine Ratio 12 9 - 20   Sodium 142 134 - 144 mmol/L   Potassium 4.5 3.5 - 5.2 mmol/L   Chloride 107 (H) 96 - 106 mmol/L   CO2 16 (L) 20 - 29 mmol/L   Calcium 10.2 8.7 - 10.2 mg/dL      Assessment & Plan:   Problem List Items Addressed This Visit      Genitourinary   Benign hypertensive renal disease - Primary    BPs continue to be variable, will restart lisinopril at lower dose 10 mg tab daily and monitor closely. Continue hold on remainder of BP regimen for now. Recheck in 2 weeks in office. Call with abnormal readings in meantime.       Relevant Orders   Basic metabolic panel (Completed)     Other   Chronic anxiety    Continue hydroxyzine and cymbalta, f/u with Psychiatry as soon as able to. Will not restart klonopin given his polysubstance abuse      Chronic abdominal pain    From traumatic injury with mesh in place, on doxycycline long-term. Referral placed to new Pain Management specialist for this and several other chronic pain conditions      Relevant Orders   Ambulatory referral to Pain Clinic    Other Visit Diagnoses    Accidental drug overdose, subsequent encounter       Post admission to hospital, feeling back to baseline now and is regretful of the incident. Has Psychiatrist in place, f/u with them for further counseling/tx   Chronic left shoulder pain       Relevant Orders   Ambulatory referral to Pain Clinic   Chronic pain of right knee       Relevant  Orders   Ambulatory referral to Pain Clinic   Flu vaccine need  Relevant Orders   Flu Vaccine QUAD 36+ mos IM (Completed)   Urinary incontinence, unspecified type       D/c flomax, f/u for further testing if issues not improving     Greater than 40 minutes spent in direct care and counseling today.    Follow up plan: Return in about 2 weeks (around 09/04/2019) for BP.

## 2019-08-22 LAB — BASIC METABOLIC PANEL
BUN/Creatinine Ratio: 12 (ref 9–20)
BUN: 22 mg/dL (ref 6–24)
CO2: 16 mmol/L — ABNORMAL LOW (ref 20–29)
Calcium: 10.2 mg/dL (ref 8.7–10.2)
Chloride: 107 mmol/L — ABNORMAL HIGH (ref 96–106)
Creatinine, Ser: 1.77 mg/dL — ABNORMAL HIGH (ref 0.76–1.27)
GFR calc Af Amer: 50 mL/min/{1.73_m2} — ABNORMAL LOW (ref >59)
GFR calc non Af Amer: 43 mL/min/{1.73_m2} — ABNORMAL LOW (ref >59)
Glucose: 99 mg/dL (ref 65–99)
Potassium: 4.5 mmol/L (ref 3.5–5.2)
Sodium: 142 mmol/L (ref 134–144)

## 2019-08-26 ENCOUNTER — Other Ambulatory Visit: Payer: Self-pay

## 2019-08-26 ENCOUNTER — Ambulatory Visit (INDEPENDENT_AMBULATORY_CARE_PROVIDER_SITE_OTHER): Payer: Medicare Other | Admitting: Pharmacist

## 2019-08-26 ENCOUNTER — Ambulatory Visit: Payer: Self-pay | Admitting: Family Medicine

## 2019-08-26 DIAGNOSIS — J441 Chronic obstructive pulmonary disease with (acute) exacerbation: Secondary | ICD-10-CM | POA: Diagnosis not present

## 2019-08-26 DIAGNOSIS — E7800 Pure hypercholesterolemia, unspecified: Secondary | ICD-10-CM

## 2019-08-26 DIAGNOSIS — I129 Hypertensive chronic kidney disease with stage 1 through stage 4 chronic kidney disease, or unspecified chronic kidney disease: Secondary | ICD-10-CM | POA: Diagnosis not present

## 2019-08-26 NOTE — Patient Instructions (Signed)
Visit Information  Goals Addressed            This Visit's Progress     Patient Stated   . "I have a lot of medications" (pt-stated)       Current Barriers:  . Polypharmacy; complex patient with multiple comorbidities including hx cerebral aneurysm, cognitive impairment, depression, chronic headaches, CKD, tobacco abuse, hx polysubstance abuse . He lives with his sister, [name], who is his caregiver and manages his medications o She requests pill packing from [facility] . Recent hospitalization at [facility] for Altered Mental Status, reports feeling well besides being "groggy" since coming home . COPD - [name] notes that they were originally on Trelegy therapy, but it was too expensive. Patient currently taking Symbicort QAM and Proair throughout the day . HTN - [name] brings home BP meter to office today to compare to office automated machine. Notes that multiple antihypertensives were d/c recently; currently on carvedilol 12.5 mg BID and amlodipine 10 mg daily; instructed to use lisinopril PRN in the afternoons for hypertension o Reports recent BP  Fasting BG SBP DBP HR  11-Sep 148 148 99 70  12-Sep 113 156 92 83  13-Sep 189 162 83 75  13-Sep 128 143 95 84  14-Sep 296 161 94 74  15-Sep 188 141 111 75  16-Sep 123 150 107 71  Average 169 152 97 76   o Patient home meter today: 143/89  Office meter today:133/82 . Reports some episodes of hyperglycemia. Checks BG fasting only, due for updated A1c o Fasting readings: see table above; patient notes he has been drinking a lot of soda/sweet tea/juices recently  Pharmacist Clinical Goal(s):  Marland Kitchen Over the next 90 days, patient will work with PharmD and provider towards optimized medication management  Interventions: . Comprehensive medication review performed; medication list updated in electronic medical record . Contacted [facility]; requested that patient be started on pill packing. Faxed a complete medication list denoting  scheduled medications to include in pill pack vs PRN medications. Recommended they contact [name] with any questions, and reach out to me for any prescription needs.  . Encouraged patient to take Symbicort BID as scheduled. As Trelegy isn't covered, and patient does not qualify for Trelegy patient assitance through [place], consider separate addition of LAMA such as Spiriva or Incruse, as patient reports using PRN SABA at least 4 times daily. Will collaborate with primary care provider Merrie Roof, PA on this . As patient home cuff is reading 10-15 mmHg higher, and patient's sister reports the meter is very old, recommend they purchase a new home meter. Recommended they contacted Customer Service number on [employer] card and inquire about Over The Counter benefits, and see if they can receive a BP cuff for free. Recommend holding off on adding back lisinopril at this time, until more reliable home BP available . Discussed mechanism of action of glipizide and risks for low blood sugars. To reduce episodes of fasting hyperglycemia, recommended patient drastically reduce consumption of soda, juice, and sweet tea (consider half and half tea instead). Patient and his sister verbalized understanding.   Patient Self Care Activities:  . Patient will take medications as prescribed . Patient will commit to reducing sweet beverage consumption.  Initial goal documentation        The patient verbalized understanding of instructions provided today and declined a print copy of patient instruction materials.   Plan: - Will collaborate with [facility] on pill packing needs.  -  Will outreach patient/his sister in the next 4-6 weeks for continued medication management support  Catie Darnelle Maffucci, PharmD, [place] Pharmacist [facility] [place] [phone]

## 2019-08-26 NOTE — Chronic Care Management (AMB) (Signed)
Chronic Care Management   Note  08/26/2019 Name: Derek Cooley. MRN: [id number] DOB: 02-May-1966   Subjective:  Derek Cooley. is a 53 y.o. year old male who is a primary care patient of Volney American, [place]. The CCM team was consulted for assistance with chronic disease management and care coordination needs.   Met with patient and his sister, [name], in office today.    Mr. Apo was given information about Chronic Care Management services today including:  1. CCM service includes personalized support from designated clinical staff supervised by his physician, including individualized plan of care and coordination with other care providers 2. 24/7 contact phone numbers for assistance for urgent and routine care needs. 3. Service will only be billed when office clinical staff spend 20 minutes or more in a month to coordinate care. 4. Only one practitioner may furnish and bill the service in a calendar month. 5. The patient may stop CCM services at any time (effective at the end of the month) by phone call to the office staff. 6. The patient will be responsible for cost sharing (co-pay) of up to 20% of the service fee (after annual deductible is met).  Patient agreed to services and verbal consent obtained.   Review of patient status, including review of consultants reports, laboratory and other test data, was performed as part of comprehensive evaluation and provision of chronic care management services.   Objective:  Lab Results  Component Value Date   CREATININE 1.77 (H) 08/21/2019   CREATININE 1.36 (H) 12/24/2018   CREATININE 1.30 (H) 06/06/2018    Lab Results  Component Value Date   HGBA1C 7.3 (H) 12/24/2018       Component Value Date/Time   CHOL 207 (H) 12/24/2018 1159   TRIG 568 (HH) 12/24/2018 1159   HDL 29 (L) 12/24/2018 1159   CHOLHDL 5.3 (H) 05/30/2018 1017   LDLCALC Comment 12/24/2018 1159    Clinical ASCVD: No  The 10-year ASCVD risk score  Mikey Bussing DC Jr., et al., 2013) is: 19.5%   Values used to calculate the score:     Age: 76 years     Sex: Male     Is Non-Hispanic African American: No     Diabetic: Yes     Tobacco smoker: Yes     Systolic Blood Pressure: 503 mmHg     Is BP treated: Yes     HDL Cholesterol: 37 mg/dL     Total Cholesterol: 175 mg/dL    BP Readings from Last 3 Encounters:  08/21/19 119/80  08/04/19 (!) 156/84  06/29/19 (!) 165/99    Allergies  Allergen Reactions  . Atorvastatin     Joint Aches - Severe Joint Aches - Severe Joint Aches - Severe  . Avelox [Moxifloxacin Hcl In Nacl]     Muscle pain  . Dilaudid [Hydromorphone Hcl] Hives  . Fluoxetine Itching  . Levofloxacin Other (See Comments)    Joint Pain  . Morphine And Related   . Other     Muscle pain  . Vancomycin     Renal insufficiency    Medications Reviewed Today    Reviewed by Volney American, PA-C (Physician Assistant Certified) on 54/65/68 at 1516  Med List Status: <None>  Medication Order Taking? Sig Documenting Provider Last Dose Status Informant  albuterol (PROVENTIL HFA;VENTOLIN HFA) 108 (90 Base) MCG/ACT inhaler [id number] Yes Inhale 1-2 puffs into the lungs every 6 (six) hours as needed for wheezing or shortness of breath.  Trinna Post, PA-C Taking Active   amLODipine (NORVASC) 10 MG tablet [id number] Yes Take 1 tablet (10 mg total) by mouth daily. Volney American, PA-C Taking Active   Blood Glucose Monitoring Suppl (ONE TOUCH ULTRA 2) w/Device Drucie Opitz [id number]   [provider]  Active   carvedilol (COREG) 12.5 MG tablet [id number] Yes Take 1 tablet (12.5 mg total) by mouth 2 (two) times a day. Volney American, [place] Taking Active         Discontinued 08/21/19 1438 (Completed Course)        Patient not taking:      Discontinued 08/21/19 1439 (Completed Course)   docusate (COLACE) 60 MG/15ML syrup [id number] No Take 100 mg by mouth 2 (two) times daily as needed. [provider] Not Taking  Active   doxycycline (VIBRAMYCIN) 100 MG capsule [id number] Yes Take 1 capsule (100 mg total) by mouth 2 (two) times daily. Volney American, [place] Taking Active   DULoxetine (CYMBALTA) 60 MG capsule [id number] Yes Take 1 capsule (60 mg total) by mouth 2 (two) times daily. Ursula Alert, MD Taking Active   fenofibrate micronized (LOFIBRA) 67 MG capsule [id number] Yes Take 1 capsule (67 mg total) by mouth daily before breakfast. Volney American, PA-C Taking Active   fluticasone [facility]) 50 MCG/ACT nasal spray [id number] Yes Place 2 sprays into both nostrils daily. Trinna Post, PA-C Taking Active        Patient not taking:      Discontinued 08/21/19 1440 (Patient Preference)   glipiZIDE (GLUCOTROL XL) 5 MG 24 hr tablet [id number] Yes Take 1 tablet (5 mg total) by mouth daily with breakfast. Volney American, PA-C Taking Active   hydrOXYzine (VISTARIL) 25 MG capsule [id number] Yes Take 1 capsule (25 mg total) by mouth 2 (two) times daily as needed for anxiety. For anxiety attacks  Patient taking differently: Take 25 mg by mouth 3 (three) times daily. For anxiety attacks   Ursula Alert, MD Taking Active   ibuprofen (ADVIL) 800 MG tablet [id number] Yes Take 1 tablet (800 mg total) by mouth every 8 (eight) hours as needed. Volney American, [place] Taking Active   ketotifen (ZADITOR) 0.025 % ophthalmic solution [id number] Yes 1 drop 2 (two) times daily. [provider] Taking Active   Lancets [facility] PLUS [id number]) [place] [id number]   [provider]  Active         Discontinued 08/21/19 1441 (Completed Course)   Donald Siva test strip [id number]   [provider]  Active   pantoprazole (PROTONIX) 40 MG tablet [id number] Yes Take 1 tablet (40 mg total) by mouth daily. Volney American, [place] Taking Active         Discontinued 08/21/19 1441 (Completed Course)         Discontinued 08/21/19 1515 (Discontinued by provider)   Thiamine HCl  (THIAMINE PO) [id number] Yes Take 100 mg by mouth daily. [provider] Taking Active   tiZANidine (ZANAFLEX) 4 MG tablet [id number] Yes TAKE ONE TABLET BY MOUTH TWICE DAILY AS NEEDED FOR MUSCLE SPASM Volney American, PA-C Taking Active   topiramate (TOPAMAX) 50 MG tablet [id number] Yes 50 mg 2 (two) times daily.  [provider] Taking Active            Assessment:   Goals Addressed            This Visit's Progress     Patient Stated   . "I have a lot of medications" (pt-stated)  Current Barriers:  . Polypharmacy; complex patient with multiple comorbidities including hx cerebral aneurysm, cognitive impairment, depression, chronic headaches, CKD, tobacco abuse, hx polysubstance abuse . He lives with his sister, [name], who is his caregiver and manages his medications o She requests pill packing from [facility] . Recent hospitalization at [facility] for Altered Mental Status, reports feeling well besides being "groggy" since coming home . COPD - [name] notes that they were originally on Trelegy therapy, but it was too expensive. Patient currently taking Symbicort QAM and Proair throughout the day . HTN - [name] brings home BP meter to office today to compare to office automated machine. Notes that multiple antihypertensives were d/c recently; currently on carvedilol 12.5 mg BID and amlodipine 10 mg daily; instructed to use lisinopril PRN in the afternoons for hypertension o Reports recent BP  Fasting BG SBP DBP HR  11-Sep 148 148 99 70  12-Sep 113 156 92 83  13-Sep 189 162 83 75  13-Sep 128 143 95 84  14-Sep 296 161 94 74  15-Sep 188 141 111 75  16-Sep 123 150 107 71  Average 169 152 97 76   o Patient home meter today: 143/89  Office meter today:133/82 . Reports some episodes of hyperglycemia. Checks BG fasting only, due for updated A1c o Fasting readings: see table above; patient notes he has been drinking a lot of soda/sweet tea/juices recently   Pharmacist Clinical Goal(s):  Marland Kitchen Over the next 90 days, patient will work with PharmD and provider towards optimized medication management  Interventions: . Comprehensive medication review performed; medication list updated in electronic medical record . Contacted [facility]; requested that patient be started on pill packing. Faxed a complete medication list denoting scheduled medications to include in pill pack vs PRN medications. Recommended they contact [name] with any questions, and reach out to me for any prescription needs.  . Encouraged patient to take Symbicort BID as scheduled. As Trelegy isn't covered, and patient does not qualify for Trelegy patient assitance through [place], consider separate addition of LAMA such as Spiriva or Incruse, as patient reports using PRN SABA at least 4 times daily. Will collaborate with primary care provider Merrie Roof, PA on this . As patient home cuff is reading 10-15 mmHg higher, and patient's sister reports the meter is very old, recommend they purchase a new home meter. Recommended they contacted Customer Service number on [employer] card and inquire about Over The Counter benefits, and see if they can receive a BP cuff for free. Recommend holding off on adding back lisinopril at this time, until more reliable home BP available . Discussed mechanism of action of glipizide and risks for low blood sugars. To reduce episodes of fasting hyperglycemia, recommended patient drastically reduce consumption of soda, juice, and sweet tea (consider half and half tea instead). Patient and his sister verbalized understanding.   Patient Self Care Activities:  . Patient will take medications as prescribed . Patient will commit to reducing sweet beverage consumption.  Initial goal documentation        Plan: - Will collaborate with [facility] on pill packing needs.  - Will outreach patient/his sister in the next 4-6 weeks for continued medication  management support  Catie Darnelle Maffucci, PharmD, [place] Pharmacist [facility] [place] [phone]

## 2019-08-27 ENCOUNTER — Other Ambulatory Visit: Payer: Self-pay | Admitting: Family Medicine

## 2019-08-27 ENCOUNTER — Ambulatory Visit: Payer: Self-pay | Admitting: Family Medicine

## 2019-08-27 ENCOUNTER — Other Ambulatory Visit: Payer: Self-pay | Admitting: Psychiatry

## 2019-09-01 ENCOUNTER — Other Ambulatory Visit: Payer: Self-pay | Admitting: Psychiatry

## 2019-09-01 ENCOUNTER — Ambulatory Visit: Payer: Self-pay | Admitting: Pharmacist

## 2019-09-01 DIAGNOSIS — E119 Type 2 diabetes mellitus without complications: Secondary | ICD-10-CM

## 2019-09-01 DIAGNOSIS — J441 Chronic obstructive pulmonary disease with (acute) exacerbation: Secondary | ICD-10-CM

## 2019-09-01 DIAGNOSIS — I129 Hypertensive chronic kidney disease with stage 1 through stage 4 chronic kidney disease, or unspecified chronic kidney disease: Secondary | ICD-10-CM

## 2019-09-01 NOTE — Patient Instructions (Signed)
  Visit Information  Goals Addressed            This Visit's Progress     Patient Stated   . "I have a lot of medications" (pt-stated)       Current Barriers:  . Polypharmacy; complex patient with multiple comorbidities including hx cerebral aneurysm, cognitive impairment, depression, chronic headaches, CKD, tobacco abuse, hx polysubstance abuse . He lives with his sister, [name], who is his caregiver and manages his medications o She requests pill packing from [facility] . COPD - Symbicort QAM and Proair throughout the day . HTN - carvedilol 12.5 mg BID and amlodipine 10 mg daily;   Pharmacist Clinical Goal(s):  Marland Kitchen Over the next 90 days, patient will work with PharmD and provider towards optimized medication management  Interventions: . Comprehensive medication review performed; medication list updated in electronic medical record . Contacted [name]; she confirms that she was contacted by [facility] and that they are working on getting patient on medication synchronization and pill packaging. She denies any needs at this time.   Patient Self Care Activities:  . Patient will take medications as prescribed . Patient will commit to reducing sweet beverage consumption.  Please see past updates related to this goal by clicking on the "Past Updates" button in the selected goal         The patient verbalized understanding of instructions provided today and declined a print copy of patient instruction materials.   Plan:  - Will outreach patient in the next 4-5 weeks for continued medication management support  Catie Darnelle Maffucci, PharmD Clinical Pharmacist [place] [phone]

## 2019-09-01 NOTE — Chronic Care Management (AMB) (Signed)
Chronic Care Management   Follow Up Note   09/01/2019 Name: Derek Cooley. MRN: [id number] DOB: Feb 26, 1966  Referred by: Volney American, PA-C Reason for referral : Chronic Care Management (Medication Management)   Derek Cooley. is a 53 y.o. year old male who is a primary care patient of Volney American, [place]. The CCM team was consulted for assistance with chronic disease management and care coordination needs.    Care coordination completed today.   Review of patient status, including review of consultants reports, relevant laboratory and other test results, and collaboration with appropriate care team members and the patient's provider was performed as part of comprehensive patient evaluation and provision of chronic care management services.    SDOH (Social Determinants of Health) screening performed today: Stress None. See Care Plan for related entries.   Advanced Directives Status: N See Care Plan and [employer] application for related entries.  Outpatient Encounter Medications as of 09/01/2019  Medication Sig Note  . albuterol (PROVENTIL HFA;VENTOLIN HFA) 108 (90 Base) MCG/ACT inhaler Inhale 1-2 puffs into the lungs every 6 (six) hours as needed for wheezing or shortness of breath.   Marland Kitchen amLODipine (NORVASC) 10 MG tablet Take 1 tablet (10 mg total) by mouth daily.   . Blood Glucose Monitoring Suppl (ONE TOUCH ULTRA 2) w/Device KIT    . budesonide-formoterol (SYMBICORT) 80-4.5 MCG/ACT inhaler Inhale 2 puffs into the lungs 2 (two) times daily.   . carvedilol (COREG) 12.5 MG tablet Take 1 tablet (12.5 mg total) by mouth 2 (two) times a day.   . docusate (COLACE) 60 MG/15ML syrup Take 100 mg by mouth 2 (two) times daily as needed.   . doxycycline (VIBRAMYCIN) 100 MG capsule Take 1 capsule (100 mg total) by mouth 2 (two) times daily.   . DULoxetine (CYMBALTA) 60 MG capsule Take 1 capsule (60 mg total) by mouth 2 (two) times daily.   . fenofibrate micronized (LOFIBRA)  67 MG capsule Take 1 capsule (67 mg total) by mouth daily before breakfast.   . fluticasone (FLONASE) 50 MCG/ACT nasal spray Place 2 sprays into both nostrils daily.   Marland Kitchen glipiZIDE (GLUCOTROL XL) 5 MG 24 hr tablet Take 1 tablet (5 mg total) by mouth daily with breakfast.   . hydrOXYzine (VISTARIL) 25 MG capsule Take 1 capsule (25 mg total) by mouth 2 (two) times daily as needed for anxiety. For anxiety attacks (Patient taking differently: Take 25 mg by mouth 3 (three) times daily. For anxiety attacks)   . ibuprofen (ADVIL) 800 MG tablet Take 1 tablet (800 mg total) by mouth every 8 (eight) hours as needed.   Marland Kitchen ketotifen (ZADITOR) 0.025 % ophthalmic solution 1 drop 2 (two) times daily.   . Lancets (ONETOUCH DELICA PLUS [id number]) [place]    . lisinopril (ZESTRIL) 10 MG tablet Take 1 tablet (10 mg total) by mouth daily. (Patient taking differently: Take 10 mg by mouth daily. Taking as needed)   . ONETOUCH ULTRA test strip    . pantoprazole (PROTONIX) 40 MG tablet Take 1 tablet (40 mg total) by mouth daily.   . Thiamine HCl (THIAMINE PO) Take 100 mg by mouth daily.   Marland Kitchen tiZANidine (ZANAFLEX) 4 MG tablet TAKE ONE TABLET BY MOUTH TWICE DAILY AS NEEDED FOR MUSCLE SPASM 08/26/2019: Taking 1 QAM, 1 lunch, 2 at bedtime   . topiramate (TOPAMAX) 50 MG tablet 50 mg 2 (two) times daily.     No facility-administered encounter medications on file as of 09/01/2019.  Goals Addressed            This Visit's Progress     Patient Stated   . "I have a lot of medications" (pt-stated)       Current Barriers:  . Polypharmacy; complex patient with multiple comorbidities including hx cerebral aneurysm, cognitive impairment, depression, chronic headaches, CKD, tobacco abuse, hx polysubstance abuse . He lives with his sister, [name], who is his caregiver and manages his medications o She requests pill packing from [facility] . COPD - Symbicort QAM and Proair throughout the day . HTN - carvedilol 12.5 mg BID and  amlodipine 10 mg daily;   Pharmacist Clinical Goal(s):  Marland Kitchen Over the next 90 days, patient will work with PharmD and provider towards optimized medication management  Interventions: . Comprehensive medication review performed; medication list updated in electronic medical record . Contacted [name]; she confirms that she was contacted by [facility] and that they are working on getting patient on medication synchronization and pill packaging. She denies any needs at this time.   Patient Self Care Activities:  . Patient will take medications as prescribed . Patient will commit to reducing sweet beverage consumption.  Please see past updates related to this goal by clicking on the "Past Updates" button in the selected goal          Plan:  - Will outreach patient in the next 4-5 weeks for continued medication management support  Catie Darnelle Maffucci, PharmD Clinical Pharmacist [place] [phone]

## 2019-09-04 DIAGNOSIS — I671 Cerebral aneurysm, nonruptured: Secondary | ICD-10-CM | POA: Diagnosis not present

## 2019-09-05 ENCOUNTER — Other Ambulatory Visit: Payer: Self-pay | Admitting: Psychiatry

## 2019-09-08 ENCOUNTER — Other Ambulatory Visit: Payer: Self-pay

## 2019-09-08 NOTE — Telephone Encounter (Signed)
Upcoming appointment tomorrow.

## 2019-09-09 ENCOUNTER — Encounter: Payer: Self-pay | Admitting: Family Medicine

## 2019-09-09 ENCOUNTER — Other Ambulatory Visit: Payer: Self-pay

## 2019-09-09 ENCOUNTER — Ambulatory Visit (INDEPENDENT_AMBULATORY_CARE_PROVIDER_SITE_OTHER): Payer: Medicare Other | Admitting: Family Medicine

## 2019-09-09 ENCOUNTER — Ambulatory Visit: Payer: Medicare Other

## 2019-09-09 DIAGNOSIS — I129 Hypertensive chronic kidney disease with stage 1 through stage 4 chronic kidney disease, or unspecified chronic kidney disease: Secondary | ICD-10-CM

## 2019-09-09 DIAGNOSIS — M5442 Lumbago with sciatica, left side: Secondary | ICD-10-CM | POA: Diagnosis not present

## 2019-09-09 DIAGNOSIS — E119 Type 2 diabetes mellitus without complications: Secondary | ICD-10-CM | POA: Diagnosis not present

## 2019-09-09 DIAGNOSIS — E781 Pure hyperglyceridemia: Secondary | ICD-10-CM

## 2019-09-09 DIAGNOSIS — M5441 Lumbago with sciatica, right side: Secondary | ICD-10-CM | POA: Diagnosis not present

## 2019-09-09 MED ORDER — ANORO ELLIPTA 62.5-25 MCG/INH IN AEPB: 1.0000 | INHALATION_SPRAY | Freq: Every day | RESPIRATORY_TRACT | 2 refills | Status: DC

## 2019-09-09 NOTE — Assessment & Plan Note (Signed)
BPs continue to be variable, will restart lisinopril at lower dose 10 mg tab daily and monitor closely. Continue hold on remainder of BP regimen for now. Recheck in 2 weeks in office. Call with abnormal readings in meantime.

## 2019-09-09 NOTE — Progress Notes (Signed)
BP (!) 148/86   Pulse 74   Temp 97.9 F (36.6 C)   SpO2 97%    Subjective:    Patient ID: Derek Binet., male    DOB: March 05, 1966, 53 y.o.   MRN: [id number]  HPI: Derek Abruzzese. is a 53 y.o. male  Chief Complaint  Patient presents with  . Hypertension   Patient presenting today for 1 month HTN f/u. His sister, who manages his medications and care for the most part was unable to attend so discussed his medications and home readings over the phone with her at time of visit (Pt with hx of TBI). She states his home readings have been around 150-170/90 with only a few episodes of low BPs near 100/50. Denies CP, dizziness, N/V. Currently taking amlodipine, coreg, and prn lisinopril which she states he rarely takes. Tolerating medicines well. Has had lots of stress and does not watch diet or exercise.   Relevant past medical, surgical, family and social history reviewed and updated as indicated. Interim medical history since our last visit reviewed. Allergies and medications reviewed and updated.  Review of Systems  Per HPI unless specifically indicated above     Objective:    BP (!) 148/86   Pulse 74   Temp 97.9 F (36.6 C)   SpO2 97%   Wt Readings from Last 3 Encounters:  08/21/19 238 lb (108 kg)  08/04/19 237 lb 2 oz (107.6 kg)  06/29/19 226 lb 6 oz (102.7 kg)    Physical Exam Vitals signs and nursing note reviewed.  Constitutional:      Appearance: Normal appearance.  HENT:     Head: Atraumatic.  Eyes:     Extraocular Movements: Extraocular movements intact.     Conjunctiva/sclera: Conjunctivae normal.  Neck:     Musculoskeletal: Normal range of motion and neck supple.  Cardiovascular:     Rate and Rhythm: Normal rate and regular rhythm.  Pulmonary:     Effort: Pulmonary effort is normal.     Breath sounds: Normal breath sounds.  Musculoskeletal: Normal range of motion.  Skin:    General: Skin is warm and dry.  Neurological:     Mental Status: Mental  status is at baseline.  Psychiatric:        Mood and Affect: Mood normal.        Thought Content: Thought content normal.        Judgment: Judgment normal.     Results for orders placed or performed in visit on 09/09/19  Microscopic Examination   URINE  Result Value Ref Range   WBC, UA None seen 0 - 5 /hpf   RBC None seen 0 - 2 /hpf   Epithelial Cells (non renal) 0-10 0 - 10 /hpf   Bacteria, UA None seen None seen/Few  UA/M w/rflx Culture, Routine   Specimen: Urine   URINE  Result Value Ref Range   Specific Gravity, UA 1.015 1.005 - 1.030   pH, UA 6.0 5.0 - 7.5   Color, UA Yellow Yellow   Appearance Ur Clear Clear   Leukocytes,UA Negative Negative   Protein,UA Trace (A) Negative/Trace   Glucose, UA Negative Negative   Ketones, UA Negative Negative   RBC, UA Negative Negative   Bilirubin, UA Negative Negative   Urobilinogen, Ur 0.2 0.2 - 1.0 mg/dL   Nitrite, UA Negative Negative   Microscopic Examination See below:   HgB A1c  Result Value Ref Range   Hgb A1c MFr  Bld 6.2 (H) 4.8 - 5.6 %   Est. average glucose Bld gHb Est-mCnc 131 mg/dL  Lipid Panel w/o Chol/HDL Ratio  Result Value Ref Range   Cholesterol, Total 202 (H) 100 - 199 mg/dL   Triglycerides 415 (H) 0 - 149 mg/dL   HDL 30 (L) >39 mg/dL   VLDL Cholesterol Cal 70 (H) 5 - 40 mg/dL   LDL Chol Calc (NIH) 102 (H) 0 - 99 mg/dL  Comprehensive metabolic panel  Result Value Ref Range   Glucose 123 (H) 65 - 99 mg/dL   BUN 19 6 - 24 mg/dL   Creatinine, Ser 1.63 (H) 0.76 - 1.27 mg/dL   GFR calc non Af Amer 47 (L) >59 mL/min/1.73   GFR calc Af Amer 55 (L) >59 mL/min/1.73   BUN/Creatinine Ratio 12 9 - 20   Sodium 141 134 - 144 mmol/L   Potassium 3.8 3.5 - 5.2 mmol/L   Chloride 107 (H) 96 - 106 mmol/L   CO2 20 20 - 29 mmol/L   Calcium 9.9 8.7 - 10.2 mg/dL   Total Protein 6.6 6.0 - 8.5 g/dL   Albumin 4.3 3.8 - 4.9 g/dL   Globulin, Total 2.3 1.5 - 4.5 g/dL   Albumin/Globulin Ratio 1.9 1.2 - 2.2   Bilirubin Total 0.3  0.0 - 1.2 mg/dL   Alkaline Phosphatase 94 39 - 117 IU/L   AST 14 0 - 40 IU/L   ALT 16 0 - 44 IU/L      Assessment & Plan:   Problem List Items Addressed This Visit      Endocrine   Controlled type 2 diabetes mellitus without complication, without long-term current use of insulin (HCC)     Genitourinary   Benign hypertensive renal disease    Will continue current regimen other than starting the lisinopril daily rather than prn how they are current taking. Continue home monitoring. Call with persistent abnormal readings. Stress reduction, exercise recommended        Other   Hypertriglyceridemia    Other Visit Diagnoses    Acute bilateral low back pain with bilateral sciatica       Requesting x-ray and will add U/A, particularly given recent fatigue sxs. Await results, hydrate well, continue zanaflex, heat prn.        Follow up plan: Return in about 3 months (around 12/09/2019) for CPE.

## 2019-09-09 NOTE — Assessment & Plan Note (Signed)
Continue hydroxyzine and cymbalta, f/u with Psychiatry as soon as able to. Will not restart klonopin given his polysubstance abuse

## 2019-09-09 NOTE — Assessment & Plan Note (Signed)
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 Referral placed to new Pain Management specialist for this and several other chronic pain conditions

## 2019-09-10 LAB — LIPID PANEL W/O CHOL/HDL RATIO
Cholesterol, Total: 202 mg/dL — ABNORMAL HIGH (ref 100–199)
HDL: 30 mg/dL — ABNORMAL LOW (ref >39)
LDL Chol Calc (NIH): 102 mg/dL — ABNORMAL HIGH (ref 0–99)
Triglycerides: 415 mg/dL — ABNORMAL HIGH (ref 0–149)
VLDL Cholesterol Cal: 70 mg/dL — ABNORMAL HIGH (ref 5–40)

## 2019-09-10 LAB — UA/M W/RFLX CULTURE, ROUTINE
Bilirubin, UA: NEGATIVE
Glucose, UA: NEGATIVE
Ketones, UA: NEGATIVE
Leukocytes,UA: NEGATIVE
Nitrite, UA: NEGATIVE
RBC, UA: NEGATIVE
Specific Gravity, UA: 1.015 (ref 1.005–1.030)
Urobilinogen, Ur: 0.2 mg/dL (ref 0.2–1.0)
pH, UA: 6 (ref 5.0–7.5)

## 2019-09-10 LAB — COMPREHENSIVE METABOLIC PANEL
ALT: 16 IU/L (ref 0–44)
AST: 14 IU/L (ref 0–40)
Albumin/Globulin Ratio: 1.9 (ref 1.2–2.2)
Albumin: 4.3 g/dL (ref 3.8–4.9)
Alkaline Phosphatase: 94 IU/L (ref 39–117)
BUN/Creatinine Ratio: 12 (ref 9–20)
BUN: 19 mg/dL (ref 6–24)
Bilirubin Total: 0.3 mg/dL (ref 0.0–1.2)
CO2: 20 mmol/L (ref 20–29)
Calcium: 9.9 mg/dL (ref 8.7–10.2)
Chloride: 107 mmol/L — ABNORMAL HIGH (ref 96–106)
Creatinine, Ser: 1.63 mg/dL — ABNORMAL HIGH (ref 0.76–1.27)
GFR calc Af Amer: 55 mL/min/{1.73_m2} — ABNORMAL LOW (ref >59)
GFR calc non Af Amer: 47 mL/min/{1.73_m2} — ABNORMAL LOW (ref >59)
Globulin, Total: 2.3 g/dL (ref 1.5–4.5)
Glucose: 123 mg/dL — ABNORMAL HIGH (ref 65–99)
Potassium: 3.8 mmol/L (ref 3.5–5.2)
Sodium: 141 mmol/L (ref 134–144)
Total Protein: 6.6 g/dL (ref 6.0–8.5)

## 2019-09-10 LAB — MICROSCOPIC EXAMINATION
Bacteria, UA: NONE SEEN
RBC, Urine: NONE SEEN /hpf (ref 0–2)
WBC, UA: NONE SEEN /hpf (ref 0–5)

## 2019-09-10 LAB — HEMOGLOBIN A1C
Est. average glucose Bld gHb Est-mCnc: 131 mg/dL
Hgb A1c MFr Bld: 6.2 % — ABNORMAL HIGH (ref 4.8–5.6)

## 2019-09-14 ENCOUNTER — Encounter: Payer: Self-pay | Admitting: Family Medicine

## 2019-09-14 NOTE — Assessment & Plan Note (Signed)
Will continue current regimen other than starting the lisinopril daily rather than prn how they are current taking. Continue home monitoring. Call with persistent abnormal readings. Stress reduction, exercise recommended

## 2019-09-18 ENCOUNTER — Other Ambulatory Visit: Payer: Self-pay | Admitting: Psychiatry

## 2019-09-21 DIAGNOSIS — M25519 Pain in unspecified shoulder: Secondary | ICD-10-CM | POA: Diagnosis not present

## 2019-09-21 DIAGNOSIS — M25569 Pain in unspecified knee: Secondary | ICD-10-CM | POA: Diagnosis not present

## 2019-09-21 DIAGNOSIS — M545 Low back pain: Secondary | ICD-10-CM | POA: Diagnosis not present

## 2019-09-21 DIAGNOSIS — R109 Unspecified abdominal pain: Secondary | ICD-10-CM | POA: Diagnosis not present

## 2019-09-23 ENCOUNTER — Other Ambulatory Visit: Payer: Self-pay | Admitting: Family Medicine

## 2019-09-23 NOTE — Telephone Encounter (Signed)
Requested medication (s) are due for refill today: yes  Requested medication (s) are on the active medication list: yes  Last refill:  08/27/2019  Future visit scheduled: no  Notes to clinic:  Refill cannot be delegated   Requested Prescriptions  Pending Prescriptions Disp Refills   tiZANidine (ZANAFLEX) 4 MG tablet [Pharmacy Med Name: TIZANIDINE HCL 4 MG TAB] 60 tablet 0    Sig: TAKE ONE TABLET TWICE DAILY AS NEEDED FOR MUSCLE SPASM     Not Delegated - Cardiovascular:  Alpha-2 Agonists - tizanidine Failed - 09/23/2019 12:20 PM      Failed - This refill cannot be delegated      Passed - Valid encounter within last 6 months    Recent Outpatient Visits          2 weeks ago Acute bilateral low back pain with bilateral sciatica   [facility] Volney American, PA-C   1 month ago Benign hypertensive renal disease   [facility] Merrie Roof [place], [place]   1 month ago Acute bilateral low back pain with bilateral sciatica   [facility] Volney American, [place]   2 months ago Controlled type 2 diabetes mellitus without complication, without long-term current use of insulin [facility])   [place], [place], [place]   7 months ago Benign hypertensive renal disease   [facility] Roof [place], [place]

## 2019-09-24 ENCOUNTER — Other Ambulatory Visit: Payer: Self-pay | Admitting: Pain Medicine

## 2019-09-24 DIAGNOSIS — M545 Low back pain, unspecified: Secondary | ICD-10-CM

## 2019-09-24 DIAGNOSIS — M25512 Pain in left shoulder: Secondary | ICD-10-CM

## 2019-09-28 ENCOUNTER — Other Ambulatory Visit: Payer: Self-pay | Admitting: Psychiatry

## 2019-10-06 ENCOUNTER — Other Ambulatory Visit: Payer: Self-pay | Admitting: Pain Medicine

## 2019-10-06 ENCOUNTER — Ambulatory Visit
Admission: RE | Admit: 2019-10-06 | Discharge: 2019-10-06 | Disposition: A | Discharge status: Home / Self Care | Payer: Medicare Other | Attending: Pain Medicine | Admitting: Pain Medicine

## 2019-10-06 ENCOUNTER — Other Ambulatory Visit: Payer: Self-pay

## 2019-10-07 ENCOUNTER — Other Ambulatory Visit: Payer: Self-pay | Admitting: Pain Medicine

## 2019-10-07 ENCOUNTER — Ambulatory Visit
Admission: RE | Admit: 2019-10-07 | Discharge: 2019-10-07 | Disposition: A | Discharge status: Home / Self Care | Payer: Medicare Other | Attending: Pain Medicine | Admitting: Pain Medicine

## 2019-10-07 ENCOUNTER — Other Ambulatory Visit: Payer: Self-pay

## 2019-10-07 ENCOUNTER — Ambulatory Visit
Admission: RE | Admit: 2019-10-07 | Discharge: 2019-10-07 | Disposition: A | Discharge status: Home / Self Care | Payer: Medicare Other | Source: Ambulatory Visit | Attending: Pain Medicine | Admitting: Pain Medicine

## 2019-10-07 DIAGNOSIS — M25561 Pain in right knee: Secondary | ICD-10-CM | POA: Insufficient documentation

## 2019-10-07 DIAGNOSIS — M1711 Unilateral primary osteoarthritis, right knee: Secondary | ICD-10-CM | POA: Diagnosis not present

## 2019-10-07 DIAGNOSIS — M25512 Pain in left shoulder: Secondary | ICD-10-CM

## 2019-10-07 DIAGNOSIS — M792 Neuralgia and neuritis, unspecified: Secondary | ICD-10-CM | POA: Diagnosis not present

## 2019-10-07 DIAGNOSIS — G894 Chronic pain syndrome: Secondary | ICD-10-CM | POA: Diagnosis not present

## 2019-10-07 DIAGNOSIS — M19012 Primary osteoarthritis, left shoulder: Secondary | ICD-10-CM | POA: Diagnosis not present

## 2019-10-07 IMAGING — CR DG SHOULDER 2+V*L*
1 series · 3 of 3 positions shown · non-contrast
Comparison: [DATE]

CLINICAL DATA: Fall 2 years ago with persistent left shoulder pain.

EXAM:
LEFT SHOULDER - 2+ VIEW

[Series 1: dg shoulder left · 0.14mm/px · 3 of 3 slices shown]
[im 1/3]
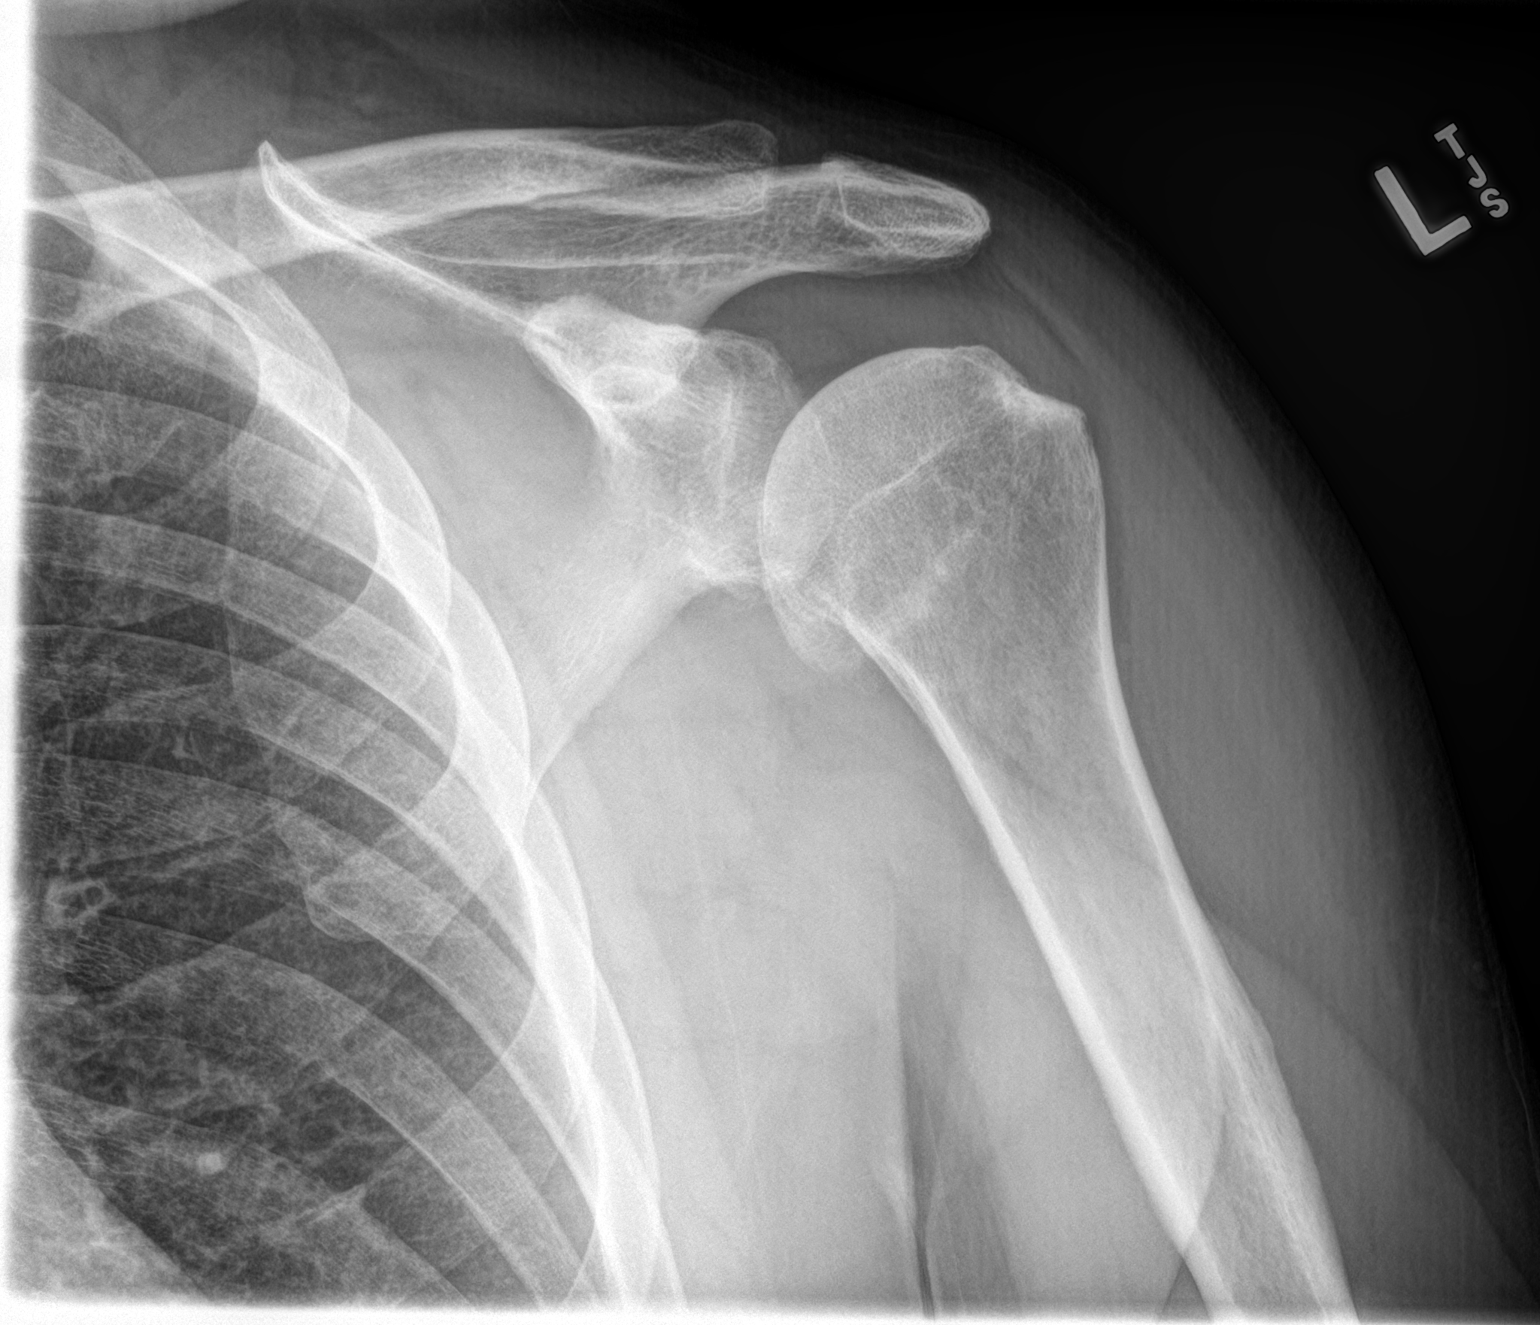
[im 2/3]
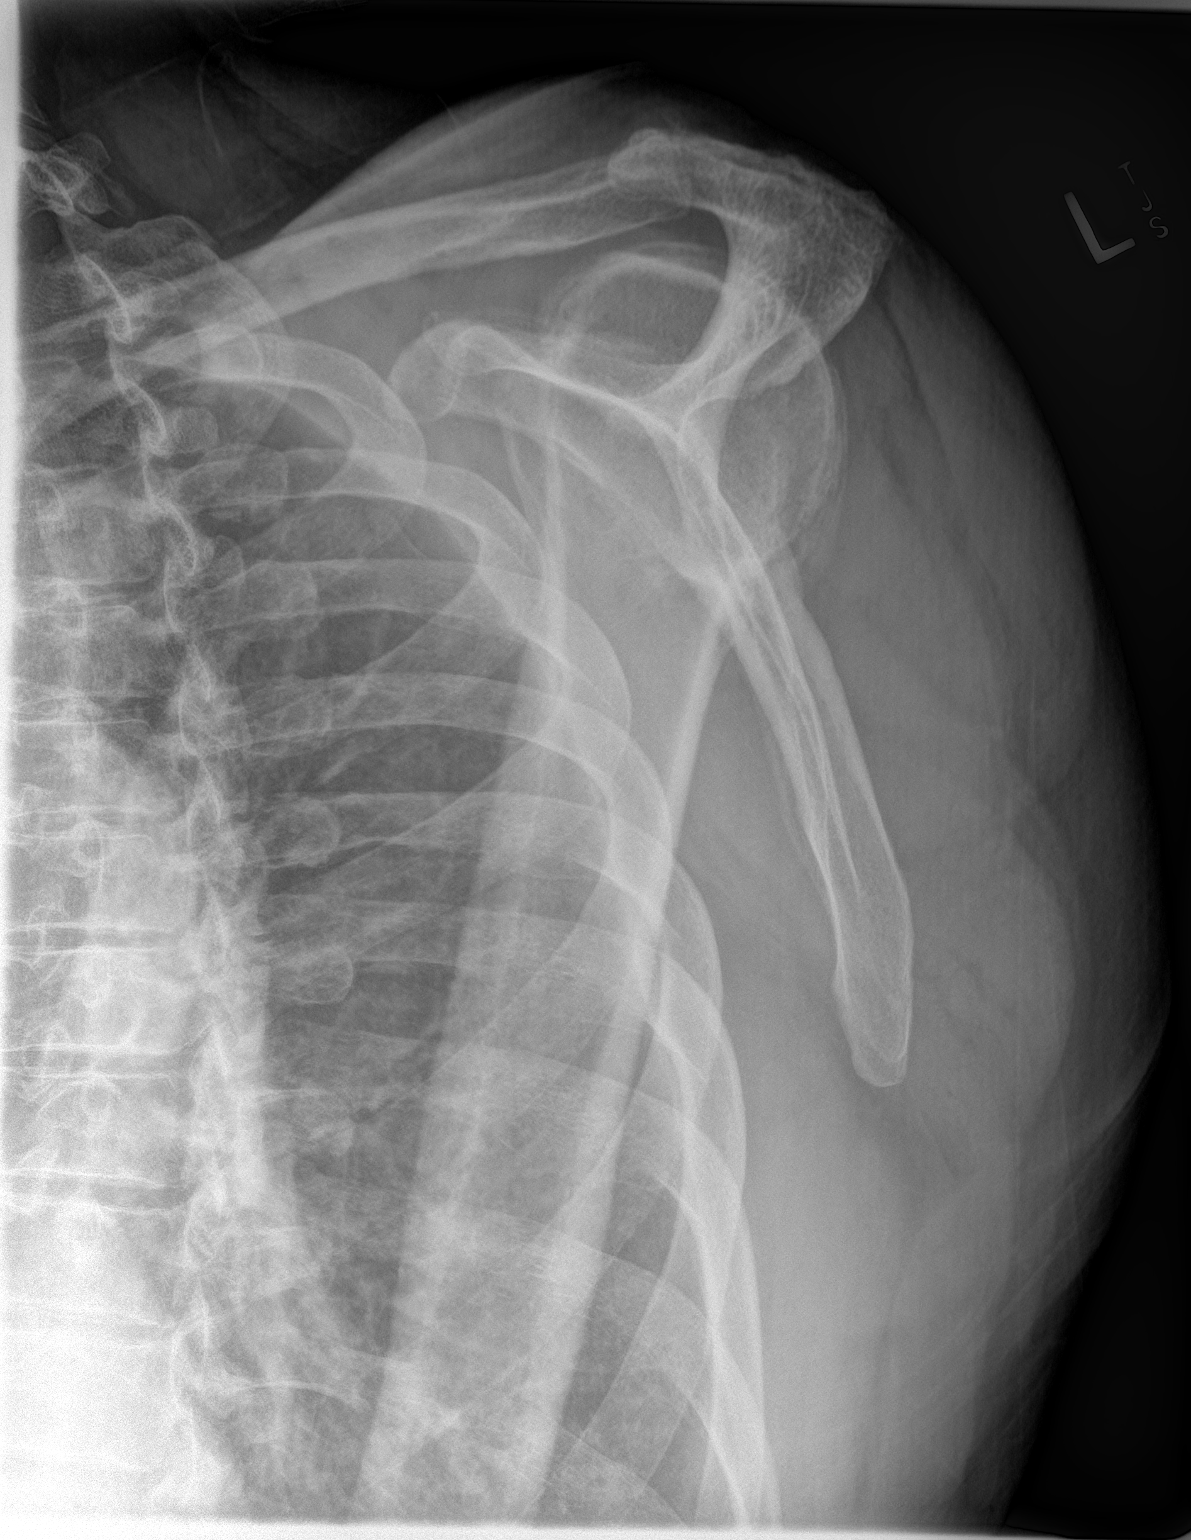
[im 3/3]
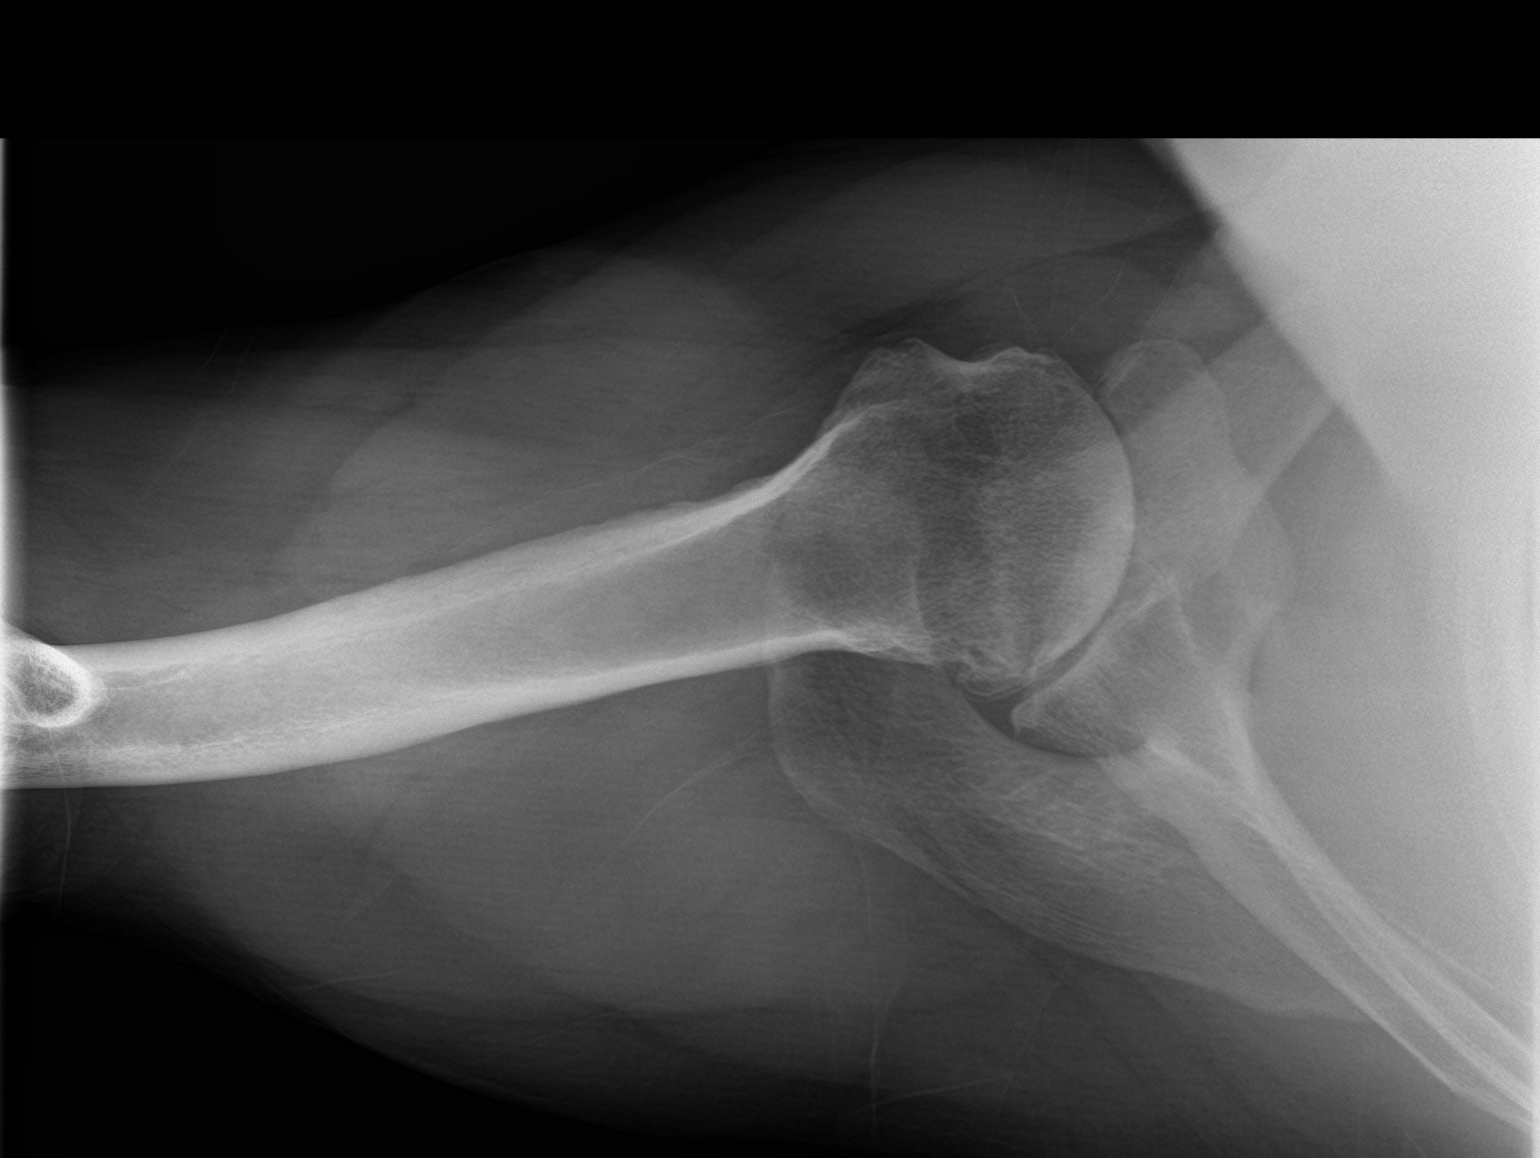

[3 of 3 positions shown; findings below may reference images not displayed]

FINDINGS: Mild-to-moderate degenerative change of the glenohumeral joint. AC
joint is unremarkable. No evidence of acute fracture or dislocation.
IMPRESSION: No acute findings.

Mild-to-moderate degenerative changes of the glenohumeral joint.

## 2019-10-07 IMAGING — CR DG KNEE 1-2V*R*
1 series · 2 of 2 positions shown · non-contrast
Comparison: [DATE]

CLINICAL DATA: Fall 2 years ago with persistent right knee pain.

EXAM:
RIGHT KNEE - 1-2 VIEW

[Series 1: dg knee 1-2 views right · 0.14mm/px · 2 of 2 slices shown]
[im 1/2]
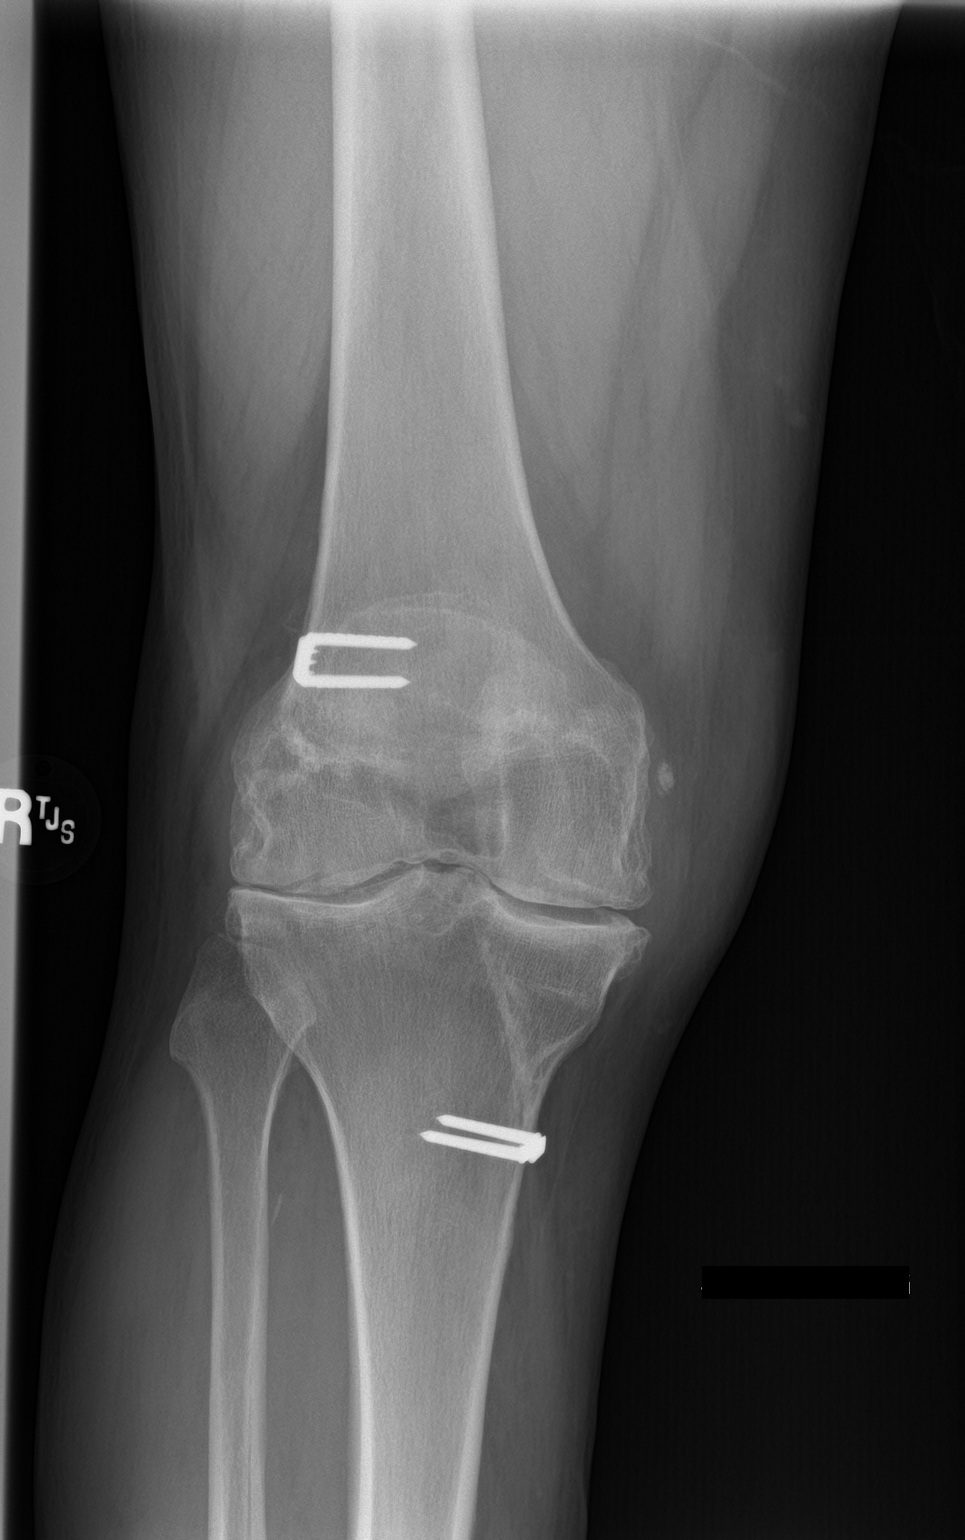
[im 2/2]
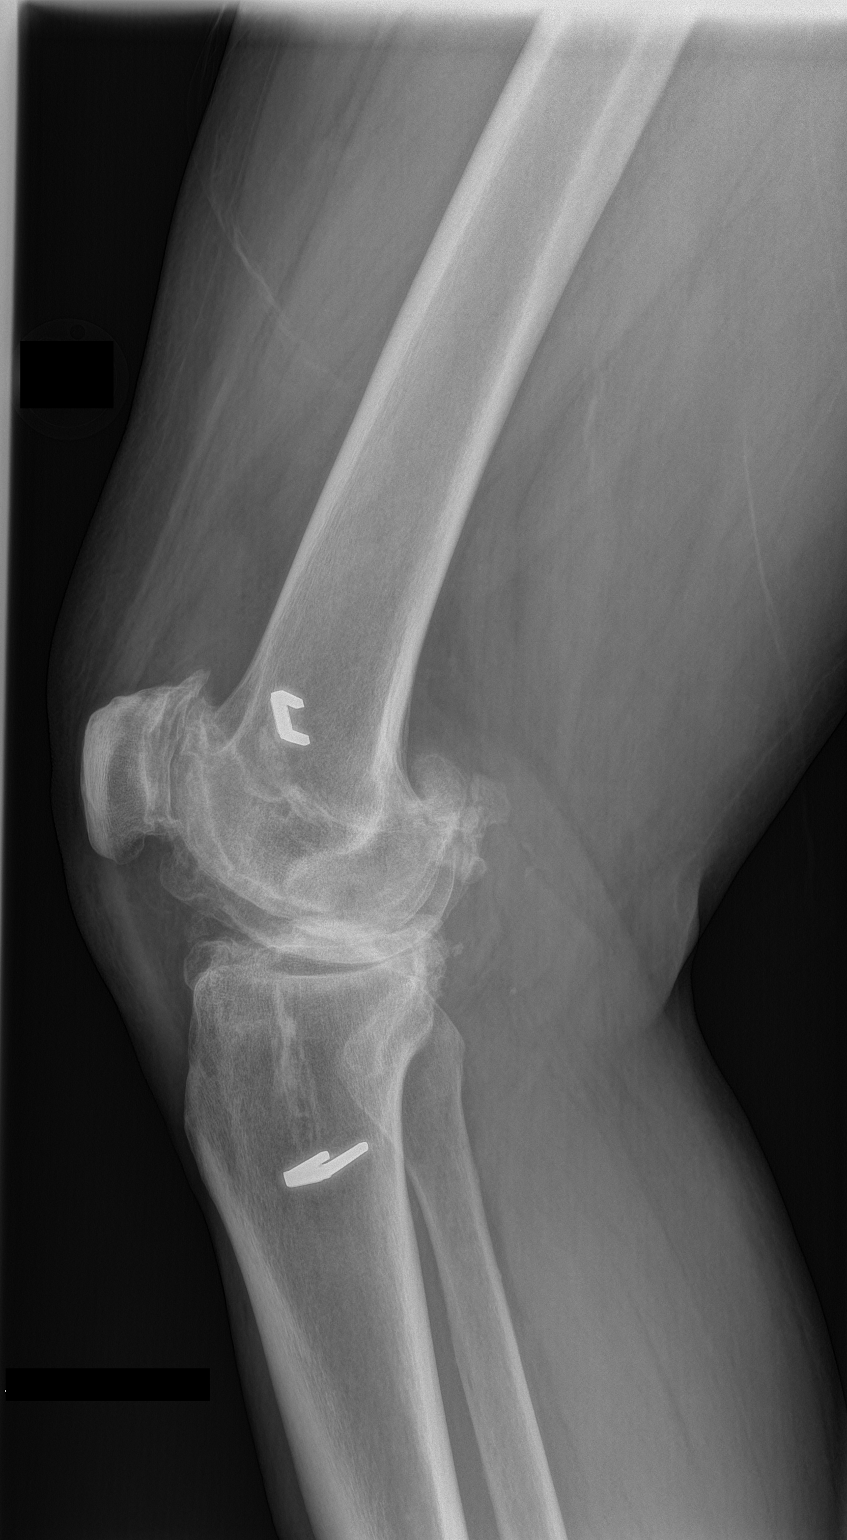

[2 of 2 positions shown; findings below may reference images not displayed]

FINDINGS: Bone staples intact and unchanged over the distal femur and proximal
tibia. Moderate stable tricompartmental osteoarthritic change. No
effusion. No acute fracture or dislocation.
IMPRESSION: No acute findings.

Moderate stable tricompartmental osteoarthritic change.

## 2019-10-21 DIAGNOSIS — M19012 Primary osteoarthritis, left shoulder: Secondary | ICD-10-CM | POA: Diagnosis not present

## 2019-10-21 DIAGNOSIS — R109 Unspecified abdominal pain: Secondary | ICD-10-CM | POA: Diagnosis not present

## 2019-10-21 DIAGNOSIS — M1711 Unilateral primary osteoarthritis, right knee: Secondary | ICD-10-CM | POA: Diagnosis not present

## 2019-10-21 DIAGNOSIS — M545 Low back pain: Secondary | ICD-10-CM | POA: Diagnosis not present

## 2019-10-26 DIAGNOSIS — M1711 Unilateral primary osteoarthritis, right knee: Secondary | ICD-10-CM | POA: Diagnosis not present

## 2019-11-04 ENCOUNTER — Ambulatory Visit: Payer: Medicare Other | Admitting: Pharmacist

## 2019-11-04 DIAGNOSIS — E119 Type 2 diabetes mellitus without complications: Secondary | ICD-10-CM

## 2019-11-04 NOTE — Patient Instructions (Signed)
Visit Information  Goals Addressed            This Visit's Progress     Patient Stated   . "I have a lot of medications" (pt-stated)       Current Barriers:  . Polypharmacy; complex patient with multiple comorbidities including hx cerebral aneurysm, cognitive impairment, depression, chronic headaches, CKD, tobacco abuse, hx polysubstance abuse . He lives with his sister, [name], who is his caregiver and manages his medications. They have been receiving Pill Packs, but she notes that it is getting expensive o COPD: Anoro, though taking every other day to space out supply d/t cost. Proair PRN o HTN: amlodipine 10 mg daily, carvedilol 12.5 mg BID o HTN - carvedilol 12.5 mg BID and amlodipine 10 mg daily; lisinopril 10 mg daily o T2DM: glipizide XL 5 mg daily o Lipids: fenofibrate 67 mg daily; ASCVD risk 37%; noted intolerance to atorvastatin 10 mg d/t myalgias   Pharmacist Clinical Goal(s):  Marland Kitchen Over the next 90 days, patient will work with PharmD and provider towards optimized medication management  Interventions: . Comprehensive medication review performed; medication list updated in electronic medical record . Discussed that patient likely qualifies for Medicare Extra Help. Schedule phone call next week with [name] to apply for this.  Nash Dimmer w/ clinic staff to set aside a sample of Anoro for patient.  Patient Self Care Activities:  . Patient will take medications as prescribed . Patient will commit to reducing sweet beverage consumption.  Please see past updates related to this goal by clicking on the "Past Updates" button in the selected goal         The patient verbalized understanding of instructions provided today and declined a print copy of patient instruction materials.     Plan: - Will talk to [place] next week regarding Medicare Extra Help application  Catie Darnelle Maffucci, PharmD, [place] [phone]

## 2019-11-04 NOTE — Chronic Care Management (AMB) (Signed)
Chronic Care Management   Follow Up Note   11/04/2019 Name: Derek Cooley. MRN: [id number] DOB: [birth year]  Referred by: Volney American, PA-C Reason for referral : Chronic Care Management (Medication Management )   Derek Cooley. is a 53 y.o. year old male who is a primary care patient of Volney American, [place]. The CCM team was consulted for assistance with chronic disease management and care coordination needs.    Outreached patient's caregiver, [name], for medication management support.   Review of patient status, including review of consultants reports, relevant laboratory and other test results, and collaboration with appropriate care team members and the patient's provider was performed as part of comprehensive patient evaluation and provision of chronic care management services.    SDOH (Social Determinants of Health) screening performed today: Financial Strain . See Care Plan for related entries.   Outpatient Encounter Medications as of 11/04/2019  Medication Sig  . albuterol (PROVENTIL HFA;VENTOLIN HFA) 108 (90 Base) MCG/ACT inhaler Inhale 1-2 puffs into the lungs every 6 (six) hours as needed for wheezing or shortness of breath.  Marland Kitchen amLODipine (NORVASC) 10 MG tablet Take 1 tablet (10 mg total) by mouth daily.  . Blood Glucose Monitoring Suppl (ONE TOUCH ULTRA 2) w/Device KIT   . budesonide-formoterol (SYMBICORT) 80-4.5 MCG/ACT inhaler Inhale 2 puffs into the lungs 2 (two) times daily.  . carvedilol (COREG) 12.5 MG tablet Take 1 tablet (12.5 mg total) by mouth 2 (two) times a day.  . docusate (COLACE) 60 MG/15ML syrup Take 100 mg by mouth 2 (two) times daily as needed.  . doxycycline (VIBRAMYCIN) 100 MG capsule Take 1 capsule (100 mg total) by mouth 2 (two) times daily.  . DULoxetine (CYMBALTA) 60 MG capsule Take 1 capsule (60 mg total) by mouth 2 (two) times daily.  . fenofibrate micronized (LOFIBRA) 67 MG capsule Take 1 capsule (67 mg total) by mouth  daily before breakfast.  . fluticasone (FLONASE) 50 MCG/ACT nasal spray Place 2 sprays into both nostrils daily.  Marland Kitchen glipiZIDE (GLUCOTROL XL) 5 MG 24 hr tablet Take 1 tablet (5 mg total) by mouth daily with breakfast.  . hydrOXYzine (VISTARIL) 25 MG capsule Take 1 capsule (25 mg total) by mouth 2 (two) times daily as needed for anxiety. For anxiety attacks (Patient taking differently: Take 25 mg by mouth 3 (three) times daily. For anxiety attacks)  . ibuprofen (ADVIL) 800 MG tablet Take 1 tablet (800 mg total) by mouth every 8 (eight) hours as needed.  Marland Kitchen ketotifen (ZADITOR) 0.025 % ophthalmic solution 1 drop 2 (two) times daily.  . Lancets (ONETOUCH DELICA PLUS [id number]) [place]   . lisinopril (ZESTRIL) 10 MG tablet Take 1 tablet (10 mg total) by mouth daily. (Patient taking differently: Take 10 mg by mouth daily. Taking as needed)  . ONETOUCH ULTRA test strip   . pantoprazole (PROTONIX) 40 MG tablet Take 1 tablet (40 mg total) by mouth daily.  . Thiamine HCl (THIAMINE PO) Take 100 mg by mouth daily.  Marland Kitchen tiZANidine (ZANAFLEX) 4 MG tablet TAKE ONE TABLET TWICE DAILY AS NEEDED FOR MUSCLE SPASM  . topiramate (TOPAMAX) 50 MG tablet 50 mg 2 (two) times daily.   Marland Kitchen umeclidinium-vilanterol (ANORO ELLIPTA) 62.5-25 MCG/INH AEPB Inhale 1 puff into the lungs daily.   No facility-administered encounter medications on file as of 11/04/2019.      Goals Addressed            This Visit's Progress     Patient Stated   . "  I have a lot of medications" (pt-stated)       Current Barriers:  . Polypharmacy; complex patient with multiple comorbidities including hx cerebral aneurysm, cognitive impairment, depression, chronic headaches, CKD, tobacco abuse, hx polysubstance abuse . He lives with his sister, [name], who is his caregiver and manages his medications. They have been receiving Pill Packs, but she notes that it is getting expensive o COPD: Anoro, though taking every other day to space out supply d/t  cost. Proair PRN o HTN: amlodipine 10 mg daily, carvedilol 12.5 mg BID o HTN - carvedilol 12.5 mg BID and amlodipine 10 mg daily; lisinopril 10 mg daily o T2DM: glipizide XL 5 mg daily o Lipids: fenofibrate 67 mg daily; ASCVD risk 37%; noted intolerance to atorvastatin 10 mg d/t myalgias   Pharmacist Clinical Goal(s):  Marland Kitchen Over the next 90 days, patient will work with PharmD and provider towards optimized medication management  Interventions: . Comprehensive medication review performed; medication list updated in electronic medical record . Discussed that patient likely qualifies for Medicare Extra Help. Schedule phone call next week with [name] to apply for this.  Nash Dimmer w/ clinic staff to set aside a sample of Anoro for patient. . Per Medicare quality gap list, patient overdue for AWV, colonoscopy, and eye exam. Sent message to clinic AWV LPN to see if any options for completed AWV before end of year.  Patient Self Care Activities:  . Patient will take medications as prescribed . Patient will commit to reducing sweet beverage consumption.  Please see past updates related to this goal by clicking on the "Past Updates" button in the selected goal         Plan: - Will talk to [place] next week regarding Medicare Extra Help application  Catie Darnelle Maffucci, PharmD, [place] [phone]

## 2019-11-08 ENCOUNTER — Other Ambulatory Visit: Payer: Self-pay | Admitting: Family Medicine

## 2019-11-09 ENCOUNTER — Other Ambulatory Visit: Payer: Self-pay | Admitting: Family Medicine

## 2019-11-09 NOTE — Telephone Encounter (Signed)
Requested medication (s) are due for refill today: yes  Requested medication (s) are on the active medication list: yes  Last refill:  10/10/2019  Future visit scheduled: no  Notes to clinic:  Refill cannot be delegated    Requested Prescriptions  Pending Prescriptions Disp Refills   topiramate (TOPAMAX) 50 MG tablet [Pharmacy Med Name: TOPIRAMATE 50 MG TAB] 60 tablet     Sig: TAKE ONE TABLET TWICE DAILY     Not Delegated - Neurology: Anticonvulsants - topiramate & zonisamide Failed - 11/09/2019 11:26 AM      Failed - This refill cannot be delegated      Failed - Cr in normal range and within 360 days    Creatinine  Date Value Ref Range Status  08/12/2014 1.55 (H) 0.60 - 1.30 mg/dL Final   Creatinine, Ser  Date Value Ref Range Status  09/09/2019 1.63 (H) 0.76 - 1.27 mg/dL Final         Passed - CO2 in normal range and within 360 days    CO2  Date Value Ref Range Status  09/09/2019 20 20 - 29 mmol/L Final   Co2  Date Value Ref Range Status  08/12/2014 27 21 - 32 mmol/L Final         Passed - Valid encounter within last 12 months    Recent Outpatient Visits          2 months ago Acute bilateral low back pain with bilateral sciatica   [facility] Volney American, PA-C   2 months ago Benign hypertensive renal disease   [facility] Volney American, [place]   3 months ago Acute bilateral low back pain with bilateral sciatica   [facility] Volney American, [place]   4 months ago Controlled type 2 diabetes mellitus without complication, without long-term current use of insulin [facility])   [place], [place], [place]   9 months ago Benign hypertensive renal disease   [facility] Merrie Roof [place], [place]             Signed Prescriptions Disp Refills   glipiZIDE (GLUCOTROL XL) 5 MG 24 hr tablet 90 tablet 0    Sig: TAKE ONE TABLET [place]     Endocrinology:  Diabetes - Sulfonylureas Passed - 11/09/2019 11:26 AM      Passed - HBA1C is between 0 and 7.9 and within 180 days    HB A1C (BAYER DCA - WAIVED)  Date Value Ref Range Status  04/17/2016 5.6 <7.0 % Final    Comment:                                          Diabetic Adult            <7.0                                       Healthy Adult        4.3 - 5.7                                                           (  DCCT/NGSP) American Diabetes Association's Summary of Glycemic Recommendations for Adults with Diabetes: Hemoglobin A1c <7.0%. More stringent glycemic goals (A1c <6.0%) may further reduce complications at the cost of increased risk of hypoglycemia.    Hgb A1c MFr Bld  Date Value Ref Range Status  09/09/2019 6.2 (H) 4.8 - 5.6 % Final    Comment:             Prediabetes: 5.7 - 6.4          Diabetes: >6.4          Glycemic control for adults with diabetes: <7.0          Passed - Valid encounter within last 6 months    Recent Outpatient Visits          2 months ago Acute bilateral low back pain with bilateral sciatica   [facility] Merrie Roof [place], [place]   2 months ago Benign hypertensive renal disease   [facility] Merrie Roof [place], [place]   3 months ago Acute bilateral low back pain with bilateral sciatica   [facility] Volney American, [place]   4 months ago Controlled type 2 diabetes mellitus without complication, without long-term current use of insulin [facility])   [place], [place], [place]   9 months ago Benign hypertensive renal disease   [facility] Merrie Roof [place], [place]

## 2019-11-11 ENCOUNTER — Ambulatory Visit: Payer: Medicare Other | Admitting: Pharmacist

## 2019-11-11 DIAGNOSIS — J441 Chronic obstructive pulmonary disease with (acute) exacerbation: Secondary | ICD-10-CM

## 2019-11-11 NOTE — Patient Instructions (Signed)
Visit Information  Goals Addressed            This Visit's Progress     Patient Stated   . "I have a lot of medications" (pt-stated)       Current Barriers:  . Polypharmacy; complex patient with multiple comorbidities including hx cerebral aneurysm, cognitive impairment, depression, chronic headaches, CKD, tobacco abuse, hx polysubstance abuse . His sister, [name], providers care and manages his medications. Notes that it has become expensive to fill all at the same time.  . Per gaps report, patient overdue for AWV, colonoscopy, DM eye exam, failing adherence measures for RAAS and DM, and SUPD gap.  o COPD: Anoro- has completely run out of this medication as well as albuterol. Notes that breathing has been much worse  o HTN: amlodipine 10 mg daily, carvedilol 12.5 mg BID; note that they have not been taking lisinopril (misunderstood instructions from [place] at last visit); reports BP has been running stable in 140s/70s.  o Chronic HA: tizanidine 4 mg QAM, 4 mg Qmidday, 12 mg QPM (per HA specialist at [facility]); topiramate 50 mg BID o Anxiety: duloxetine 60 mg BID, hydroxyzine 25 mg TID - notes that they were advised to increase to TID per Apolonio Schneiders, though updated prescription is needed o T2DM: glipizide XL 5 mg daily o Lipids: fenofibrate 67 mg daily; ASCVD risk 37%; noted intolerance to atorvastatin 10 mg d/t myalgias   Pharmacist Clinical Goal(s):  Marland Kitchen Over the next 90 days, patient will work with PharmD and provider towards optimized medication management  Interventions: . Comprehensive medication review performed; medication list updated in electronic medical record . Collaborated w/ [name] and patient; completed Medicare Extra Help/LIS application. D/t his yearly annuity from a previous injury, he may be denied. However, proof of denial will be helpful for future patient assistance applications. They are aware that decision will come in the mail in the next 3-6 weeks . Patient will not  qualify for Anoro assistance d/t requirement for $600 OOP spend, however, would qualify for [employer] assistance through [employer]. Collaborated w/ Merrie Roof, PA. Patient is coming next week for visit, will bring proof of income and will sign application . Collaborated w/ Tyler Aas, AWV LPN. Pt scheduled for AWV and f/u with PCP next week. Discussed that patient may also be due for colonoscopy and DM eye exam quality gaps.  . Consider rosuvastatin 5 mg daily for ASCVD risk reduction. If myalgias develop, reduce to TIW dosing.  . Moving forward, will continue to discuss tobacco cessation  Patient Self Care Activities:  . Patient will take medications as prescribed . Patient will commit to reducing sweet beverage consumption.  Please see past updates related to this goal by clicking on the "Past Updates" button in the selected goal         The patient verbalized understanding of instructions provided today and declined a print copy of patient instruction materials.   Plan: - Will collaborate w/ patient and provider for medication access needs as above  Catie Darnelle Maffucci, PharmD, [place] [phone]

## 2019-11-11 NOTE — Chronic Care Management (AMB) (Signed)
Chronic Care Management   Follow Up Note   11/11/2019 Name: [name] MRN: [id number] DOB: 22-Jan-1966  Referred by: Volney American, PA-C Reason for referral : Chronic Care Management (Medication Management)   [name] is a 53 y.o. year old male who is a primary care patient of Volney American, [place]. The CCM team was consulted for assistance with chronic disease management and care coordination needs.    Contacted patient and his sister, [name], for medication management review today.   Review of patient status, including review of consultants reports, relevant laboratory and other test results, and collaboration with appropriate care team members and the patient's provider was performed as part of comprehensive patient evaluation and provision of chronic care management services.    SDOH (Social Determinants of Health) screening performed today: Financial Strain . See Care Plan for related entries.   Outpatient Encounter Medications as of 11/11/2019  Medication Sig Note  . amLODipine (NORVASC) 10 MG tablet Take 1 tablet (10 mg total) by mouth daily. 11/11/2019: QAM  . carvedilol (COREG) 12.5 MG tablet Take 1 tablet (12.5 mg total) by mouth 2 (two) times a day.   Marland Kitchen doxycycline (VIBRAMYCIN) 100 MG capsule Take 1 capsule (100 mg total) by mouth 2 (two) times daily. 11/11/2019: QAM and Qevening  . DULoxetine (CYMBALTA) 60 MG capsule TAKE 1 CAPSULE TWICE DAILY   . fenofibrate micronized (LOFIBRA) 67 MG capsule Take 1 capsule (67 mg total) by mouth daily before breakfast. 11/11/2019: QAM  . glipiZIDE (GLUCOTROL XL) 5 MG 24 hr tablet TAKE ONE TABLET EACH MORNING WITH BREAKFAST   . hydrOXYzine (VISTARIL) 25 MG capsule Take 1 capsule (25 mg total) by mouth 2 (two) times daily as needed for anxiety. For anxiety attacks (Patient taking differently: Take 25 mg by mouth 3 (three) times daily. For anxiety attacks)   . Lancets (ONETOUCH DELICA PLUS [id number]) MISC    .  pantoprazole (PROTONIX) 40 MG tablet Take 1 tablet (40 mg total) by mouth daily.   . Thiamine HCl (THIAMINE PO) Take 100 mg by mouth daily.   Marland Kitchen tiZANidine (ZANAFLEX) 4 MG tablet TAKE ONE TABLET TWICE DAILY AS NEEDED FOR MUSCLE SPASM (Patient taking differently: 1 QAM, 1 midday, and 3 bedtime per headache MD [facility])   . topiramate (TOPAMAX) 50 MG tablet 50 mg 2 (two) times daily.    Marland Kitchen albuterol (PROVENTIL HFA;VENTOLIN HFA) 108 (90 Base) MCG/ACT inhaler Inhale 1-2 puffs into the lungs every 6 (six) hours as needed for wheezing or shortness of breath. (Patient not taking: Reported on 11/11/2019)   . Blood Glucose Monitoring Suppl (ONE TOUCH ULTRA 2) w/Device KIT    . docusate (COLACE) 60 MG/15ML syrup Take 100 mg by mouth 2 (two) times daily as needed.   . fluticasone (FLONASE) 50 MCG/ACT nasal spray Place 2 sprays into both nostrils daily. (Patient not taking: Reported on 11/11/2019)   . ibuprofen (ADVIL) 800 MG tablet Take 1 tablet (800 mg total) by mouth every 8 (eight) hours as needed.   Marland Kitchen ketotifen (ZADITOR) 0.025 % ophthalmic solution 1 drop 2 (two) times daily.   Marland Kitchen lisinopril (ZESTRIL) 10 MG tablet Take 1 tablet (10 mg total) by mouth daily. (Patient not taking: Reported on 11/11/2019)   . ONETOUCH ULTRA test strip    . umeclidinium-vilanterol (ANORO ELLIPTA) 62.5-25 MCG/INH AEPB Inhale 1 puff into the lungs daily. (Patient not taking: Reported on 11/11/2019)   . [DISCONTINUED] budesonide-formoterol (SYMBICORT) 80-4.5 MCG/ACT inhaler Inhale 2 puffs  into the lungs 2 (two) times daily.    No facility-administered encounter medications on file as of 11/11/2019.      Goals Addressed            This Visit's Progress     Patient Stated   . "I have a lot of medications" (pt-stated)       Current Barriers:  . Polypharmacy; complex patient with multiple comorbidities including hx cerebral aneurysm, cognitive impairment, depression, chronic headaches, CKD, tobacco abuse, hx polysubstance abuse . His  sister, [name], providers care and manages his medications. Notes that it has become expensive to fill all at the same time.  . Per gaps report, patient overdue for AWV, colonoscopy, DM eye exam, failing adherence measures for RAAS and DM, and SUPD gap.  o COPD: Anoro- has completely run out of this medication as well as albuterol. Notes that breathing has been much worse  o HTN: amlodipine 10 mg daily, carvedilol 12.5 mg BID; note that they have not been taking lisinopril (misunderstood instructions from [place] at last visit); reports BP has been running stable in 140s/70s.  o Chronic HA: tizanidine 4 mg QAM, 4 mg Qmidday, 12 mg QPM (per HA specialist at [facility]); topiramate 50 mg BID o Anxiety: duloxetine 60 mg BID, hydroxyzine 25 mg TID - notes that they were advised to increase to TID per Apolonio Schneiders, though updated prescription is needed o T2DM: glipizide XL 5 mg daily o Lipids: fenofibrate 67 mg daily; ASCVD risk 37%; noted intolerance to atorvastatin 10 mg d/t myalgias   Pharmacist Clinical Goal(s):  Marland Kitchen Over the next 90 days, patient will work with PharmD and provider towards optimized medication management  Interventions: . Comprehensive medication review performed; medication list updated in electronic medical record . Collaborated w/ [name] and patient; completed Medicare Extra Help/LIS application. D/t his yearly annuity from a previous injury, he may be denied. However, proof of denial will be helpful for future patient assistance applications.  . Patient will not qualify for Anoro assistance d/t requirement for $600 OOP spend, however, would qualify for [employer] assistance through [employer]. Collaborated w/ Merrie Roof, PA. Patient is coming next week for visit, will bring proof of income and will sign application . Collaborated w/ Tyler Aas, AWV LPN. Pt scheduled for AWV and f/u with PCP next week. Discussed that patient may also be due for colonoscopy and DM eye exam quality  gaps.  . Consider rosuvastatin 5 mg daily for ASCVD risk reduction. If myalgias develop, reduce to TIW dosing.  . Moving forward, will continue to discuss tobacco cessation  Patient Self Care Activities:  . Patient will take medications as prescribed . Patient will commit to reducing sweet beverage consumption.  Please see past updates related to this goal by clicking on the "Past Updates" button in the selected goal         Plan: - Will collaborate w/ patient and provider for medication access needs as above  Catie Darnelle Maffucci, PharmD, [place] [phone]

## 2019-11-12 ENCOUNTER — Other Ambulatory Visit: Payer: Self-pay | Admitting: Family Medicine

## 2019-11-12 NOTE — Telephone Encounter (Signed)
Requested medication (s) are due for refill today: yes  Requested medication (s) are on the active medication list: yes  Last refill: 10/08/2019  Future visit scheduled: yes  Notes to clinic:  Not delegated    Requested Prescriptions  Pending Prescriptions Disp Refills   tiZANidine (ZANAFLEX) 4 MG tablet [Pharmacy Med Name: TIZANIDINE HCL 4 MG TAB] 60 tablet 0    Sig: TAKE ONE TABLET TWICE DAILY AS NEEDED FOR MUSCLE SPASM     Not Delegated - Cardiovascular:  Alpha-2 Agonists - tizanidine Failed - 11/12/2019  9:42 AM      Failed - This refill cannot be delegated      Passed - Valid encounter within last 6 months    Recent Outpatient Visits          2 months ago Acute bilateral low back pain with bilateral sciatica   [facility] Volney American, PA-C   2 months ago Benign hypertensive renal disease   [facility] Merrie Roof [place], [place]   3 months ago Acute bilateral low back pain with bilateral sciatica   [facility] Volney American, [place]   4 months ago Controlled type 2 diabetes mellitus without complication, without long-term current use of insulin [facility])   [place], [place], [place]   9 months ago Benign hypertensive renal disease   [facility] Volney American, [place]      Future Appointments            In 6 days  [employer], [place]   In 6 days Orene Desanctis, Lilia Argue, [place], [place]

## 2019-11-18 ENCOUNTER — Ambulatory Visit (INDEPENDENT_AMBULATORY_CARE_PROVIDER_SITE_OTHER): Payer: Medicare Other

## 2019-11-18 ENCOUNTER — Ambulatory Visit (INDEPENDENT_AMBULATORY_CARE_PROVIDER_SITE_OTHER): Payer: Medicare Other | Admitting: Pharmacist

## 2019-11-18 ENCOUNTER — Ambulatory Visit (INDEPENDENT_AMBULATORY_CARE_PROVIDER_SITE_OTHER): Payer: Medicare Other | Admitting: Family Medicine

## 2019-11-18 ENCOUNTER — Other Ambulatory Visit: Payer: Self-pay

## 2019-11-18 VITALS — BP 158/104 | HR 72 | Temp 98.5°F | Ht 66.5 in | Wt 251.8 lb

## 2019-11-18 VITALS — BP 165/99 | HR 74 | Temp 98.5°F | Ht 66.5 in | Wt 251.8 lb

## 2019-11-18 DIAGNOSIS — J441 Chronic obstructive pulmonary disease with (acute) exacerbation: Secondary | ICD-10-CM

## 2019-11-18 DIAGNOSIS — J449 Chronic obstructive pulmonary disease, unspecified: Secondary | ICD-10-CM

## 2019-11-18 DIAGNOSIS — G44229 Chronic tension-type headache, not intractable: Secondary | ICD-10-CM | POA: Diagnosis not present

## 2019-11-18 DIAGNOSIS — K219 Gastro-esophageal reflux disease without esophagitis: Secondary | ICD-10-CM

## 2019-11-18 DIAGNOSIS — E119 Type 2 diabetes mellitus without complications: Secondary | ICD-10-CM

## 2019-11-18 DIAGNOSIS — R079 Chest pain, unspecified: Secondary | ICD-10-CM

## 2019-11-18 DIAGNOSIS — Z0000 Encounter for general adult medical examination without abnormal findings: Secondary | ICD-10-CM | POA: Diagnosis not present

## 2019-11-18 DIAGNOSIS — I10 Essential (primary) hypertension: Secondary | ICD-10-CM | POA: Diagnosis not present

## 2019-11-18 DIAGNOSIS — E7800 Pure hypercholesterolemia, unspecified: Secondary | ICD-10-CM | POA: Diagnosis not present

## 2019-11-18 DIAGNOSIS — I152 Hypertension secondary to endocrine disorders: Secondary | ICD-10-CM

## 2019-11-18 DIAGNOSIS — E1159 Type 2 diabetes mellitus with other circulatory complications: Secondary | ICD-10-CM | POA: Diagnosis not present

## 2019-11-18 MED ORDER — ALBUTEROL SULFATE (2.5 MG/3ML) 0.083% IN NEBU: 2.5000 mg | INHALATION_SOLUTION | Freq: Four times a day (QID) | RESPIRATORY_TRACT | 1 refills | Status: DC | PRN

## 2019-11-18 MED ORDER — PANTOPRAZOLE SODIUM 40 MG PO TBEC: 40.0000 mg | DELAYED_RELEASE_TABLET | Freq: Two times a day (BID) | ORAL | 2 refills | Status: DC | PRN

## 2019-11-18 MED ORDER — ALBUTEROL SULFATE HFA 108 (90 BASE) MCG/ACT IN AERS: 1.0000 | INHALATION_SPRAY | Freq: Four times a day (QID) | RESPIRATORY_TRACT | 3 refills | Status: DC | PRN

## 2019-11-18 MED ORDER — PREDNISONE 20 MG PO TABS: 40.0000 mg | ORAL_TABLET | Freq: Every day | ORAL | 0 refills | Status: DC

## 2019-11-18 NOTE — Progress Notes (Signed)
Subjective:   Derek Cooley. is a 53 y.o. male who presents for Medicare Annual/Subsequent preventive examination.  Review of Systems:  Cardiac Risk Factors include: advanced age (>56mn, >>55women);male gender;dyslipidemia;diabetes mellitus;hypertension;obesity (BMI >30kg/m2);smoking/ tobacco exposure     Objective:    Vitals: BP (!) 158/104 (BP Location: Left Arm, Patient Position: Sitting, Cuff Size: Normal)   Pulse 72   Temp 98.5 F (36.9 C) (Temporal)   Ht 5' 6.5" (1.689 m)   Wt 251 lb 12.8 oz (114.2 kg)   SpO2 95%   BMI 40.03 kg/m   Body mass index is 40.03 kg/m.  Advanced Directives 11/18/2019 10/09/2017 07/24/2015 07/10/2015  Does Patient Have a Medical Advance Directive? No No No No  Does patient want to make changes to medical advance directive? Yes (MAU/Ambulatory/Procedural Areas - Information given) - - -  Would patient like information on creating a medical advance directive? - No - Patient declined Yes - Educational materials given No - patient declined information  Some encounter information is confidential and restricted. Go to Review Flowsheets activity to see all data.    Tobacco Social History   Tobacco Use  Smoking Status Current Every Day Smoker  . Packs/day: 1.50  . Types: Cigarettes  . Start date: 10/11/1984  Smokeless Tobacco Never Used     Ready to quit: No Counseling given: Yes   Clinical Intake:  Pre-visit preparation completed: Yes  Pain : 0-10 Pain Score: 7  Pain Type: Chronic pain Pain Location: Generalized Pain Onset: More than a month ago Pain Frequency: Constant     Nutritional Status: BMI > 30  Obese Nutritional Risks: None Diabetes: Yes CBG done?: No Did pt. bring in CBG monitor from home?: No  How often do you need to have someone help you when you read instructions, pamphlets, or other written materials from your doctor or pharmacy?: 1 - Never  Nutrition Risk Assessment:  Has the patient had any N/V/D within the  last 2 months?  No  Does the patient have any non-healing wounds?  No  Has the patient had any unintentional weight loss or weight gain?  No   Diabetes:  Is the patient diabetic?  Yes  If diabetic, was a CBG obtained today?  No  Did the patient bring in their glucometer from home?  No  How often do you monitor your CBG's? daily   Financial Strains and Diabetes Management:  In contact with CCM team.   Diabetic Exams:  [facility]   [facility]abetic Foot Exam: Pt has been advised about the importance in completing this exam.   Interpreter Needed?: No  Information entered by :: Tiffany Hill,LPN  Past Medical History:  Diagnosis Date  . Abdominal abscess   . Acute pancreatitis   . Anxiety   . Asthma   . Back pain   . Depression   . [place] mellitus without complication ([place]   . 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 GERD (gastroesophageal reflux disease)   . [facility] into [place]arachnoid space of neuraxis ([facility] 01/26/2010  . Hypertension   . Mild cognitive impairment   . MRSA (methicillin resistant Staphylococcus aureus)   . Obesity   . Panic disorder   . Perforated bowel ([facility] 2007   Tempoary Colostomy Bag, Skin Graft for Abd wound  . Sleep apnea    sleep study 2013  . Sub[place]achnoid hemorrhage ([facility] 2011   coil placed  . Type  2 diabetes mellitus without complication, without long-term current use of insulin ([place]) 04/17/2016  . Vitreous hemorrhage ([place]) 03/27/2011   Overview:  Bilateral; 01/2010, from [facility]    Past Surgical History:  Procedure Laterality Date  . COLON SURGERY  2007   colostomy bag placed s/p perforated bowel  . KNEE SURGERY Right   . Perforated bowel     Family History  Problem Relation Age of Onset  . Arthritis Mother   . Asthma Mother   . Diabetes Mother   . Heart disease Mother   . Hyperlipidemia Mother   . Hypertension Mother   . Kidney disease Mother   . Thyroid disease Mother   . Lung disease  Mother   . Anxiety disorder Mother   . Depression Mother   . Diabetes Father   . Heart disease Father   . Depression Father   . Anxiety disorder Father   . Arthritis Sister   . Asthma Sister   . Hyperlipidemia Sister   . Hypertension Sister   . Lung disease Sister   . Anxiety disorder Sister   . Depression Sister   . Hyperlipidemia Brother   . Hypertension Brother   . Diabetes Sister   . Heart disease Sister   . Depression Sister   . Anxiety disorder Sister   . Anxiety disorder Brother   . Depression Brother   . Heart disease Brother    Social History   Socioeconomic History  . Marital status: Divorced    Spouse name: Not on file  . Number of children: Not on file  . Years of education: Not on file  . Highest education level: Not on file  Occupational History  . Not on file  Social Needs  . Financial resource strain: Somewhat hard  . Food insecurity    Worry: Never true    Inability: Never true  . Transportation needs    Medical: Yes    Non-medical: Yes  Tobacco Use  . Smoking status: Current Every Day Smoker    Packs/day: 1.50    Types: Cigarettes    Start date: 10/11/1984  . Smokeless tobacco: Never Used  Substance and Sexual Activity  . Alcohol use: Yes    Alcohol/week: 0.0 standard drinks    Comment: occasionall   . Drug use: No  . Sexual activity: Not Currently  Lifestyle  . Physical activity    Days per week: 0 days    Minutes per session: 0 min  . Stress: Not at all  Relationships  . Social connections    Talks on phone: More than three times a week    Gets together: More than three times a week    Attends religious service: Never    Active member of club or organization: No    Attends meetings of clubs or organizations: Never    Relationship status: Divorced  Other Topics Concern  . Not on file  Social History Narrative  . Not on file    Outpatient Encounter Medications as of 11/18/2019  Medication Sig  . amLODipine (NORVASC) 10 MG  tablet Take 1 tablet (10 mg total) by mouth daily.  . Blood Glucose Monitoring Suppl (ONE TOUCH ULTRA 2) w/Device KIT   . carvedilol (COREG) 12.5 MG tablet Take 1 tablet (12.5 mg total) by mouth 2 (two) times a day.  . docusate (COLACE) 60 MG/15ML syrup Take 100 mg by mouth 2 (two) times daily as needed.  . doxycycline (VIBRAMYCIN) 100 MG capsule Take 1 capsule (  100 mg total) by mouth 2 (two) times daily.  . DULoxetine (CYMBALTA) 60 MG capsule TAKE 1 CAPSULE TWICE DAILY  . fenofibrate micronized (LOFIBRA) 67 MG capsule Take 1 capsule (67 mg total) by mouth daily before breakfast.  . glipiZIDE (GLUCOTROL XL) 5 MG 24 hr tablet TAKE ONE TABLET EACH MORNING WITH BREAKFAST  . hydrOXYzine (VISTARIL) 25 MG capsule Take 1 capsule (25 mg total) by mouth 2 (two) times daily as needed for anxiety. For anxiety attacks (Patient taking differently: Take 25 mg by mouth 3 (three) times daily. For anxiety attacks)  . ibuprofen (ADVIL) 800 MG tablet Take 1 tablet (800 mg total) by mouth every 8 (eight) hours as needed.  Marland Kitchen ketotifen (ZADITOR) 0.025 % ophthalmic solution 1 drop 2 (two) times daily.  . Lancets (ONETOUCH DELICA PLUS [id number]) [place]   . ONETOUCH ULTRA test strip   . pantoprazole (PROTONIX) 40 MG tablet Take 1 tablet (40 mg total) by mouth daily.  . Thiamine HCl (THIAMINE PO) Take 100 mg by mouth daily.  Marland Kitchen tiZANidine (ZANAFLEX) 4 MG tablet TAKE ONE TABLET TWICE DAILY AS NEEDED FOR MUSCLE SPASM  . topiramate (TOPAMAX) 50 MG tablet 50 mg 2 (two) times daily.   Marland Kitchen albuterol (PROVENTIL HFA;VENTOLIN HFA) 108 (90 Base) MCG/ACT inhaler Inhale 1-2 puffs into the lungs every 6 (six) hours as needed for wheezing or shortness of breath. (Patient not taking: Reported on 11/11/2019)  . fluticasone (FLONASE) 50 MCG/ACT nasal spray Place 2 sprays into both nostrils daily. (Patient not taking: Reported on 11/11/2019)  . lisinopril (ZESTRIL) 10 MG tablet Take 1 tablet (10 mg total) by mouth daily. (Patient not taking:  Reported on 11/11/2019)  . umeclidinium-vilanterol (ANORO ELLIPTA) 62.5-25 MCG/INH AEPB Inhale 1 puff into the lungs daily. (Patient not taking: Reported on 11/11/2019)   No facility-administered encounter medications on file as of 11/18/2019.     Activities of Daily Living In your present state of health, do you have any difficulty performing the following activities: 11/18/2019 06/29/2019  Hearing? N N  Comment no hearing aids -  Vision? Y Y  Comment eyeglasses, goes to [place] -  Difficulty concentrating or making decisions? Tempie Donning  Comment sometimes comes back -  Walking or climbing stairs? Y N  Comment SOB -  Dressing or bathing? N N  Doing errands, shopping? Tempie Donning  Preparing Food and eating ? N -  Using the Toilet? N -  In the past six months, have you accidently leaked urine? N -  Do you have problems with loss of bowel control? N -  Managing your Medications? Y -  Comment sister handles -  Managing your Finances? N -  Housekeeping or managing your Housekeeping? N -  Some recent data might be hidden    Patient Care Team: Volney American, PA-C as PCP - General (Family Medicine) De Hollingshead, [facility] as Pharmacist (Pharmacist)   Assessment:   This is a routine wellness examination for Kyshawn.  Exercise Activities and Dietary recommendations Current Exercise Habits: The patient does not participate in regular exercise at present, Exercise limited by: respiratory conditions(s)  Goals    . "I have a lot of medications" (pt-stated)     Current Barriers:  . Polypharmacy; complex patient with multiple comorbidities including hx cerebral aneurysm, cognitive impairment, depression, chronic headaches, CKD, tobacco abuse, hx polysubstance abuse o Financial Barriers - completed Medicare Extra Help application  . His sister, [name], providers care and manages his medications. Notes that it  has become expensive to fill all at the same time.  . Per gaps report, patient  overdue for AWV, colonoscopy, DM eye exam, failing adherence measures for RAAS and DM, and SUPD gap.  o COPD: Anoro- has completely run out of this medication as well as albuterol. Provided with sample of Anoro today. Collaboratively decided to apply for [place] assistance, as patient would not qualify for assistance through [place], but would through [place] o HTN: amlodipine 10 mg daily, carvedilol 12.5 mg BID; note that they have not been taking lisinopril (misunderstood instructions from [place] at last visit); reports BP has been running stable in 140s/70s.  o Chronic HA: tizanidine 4 mg QAM, 4 mg Qmidday, 12 mg QPM (per HA specialist at [facility]); topiramate 50 mg BID o Anxiety: duloxetine 60 mg BID, hydroxyzine 25 mg TID - notes that they were advised to increase to TID per Apolonio Schneiders, though updated prescription is needed o T2DM: glipizide XL 5 mg daily o Lipids: fenofibrate 67 mg daily; ASCVD risk 37%; noted intolerance to atorvastatin 10 mg d/t myalgias   Pharmacist Clinical Goal(s):  Marland Kitchen Over the next 90 days, patient will work with PharmD and provider towards optimized medication management  Interventions: . Comprehensive medication review performed; medication list updated in electronic medical record . Collaborated w/ [name] and patient; completed Medicare Extra Help/LIS application. D/t his yearly annuity from a previous injury, he may be denied. However, proof of denial will be helpful for future patient assistance applications. They are aware that decision will come in the mail in the next 3-6 weeks . Patient will not qualify for Anoro assistance d/t requirement for $600 OOP spend, however, would qualify for [employer] assistance through [employer]. Collaborated w/ Merrie Roof, PA. Patient is coming next week for visit, will bring proof of income and will sign application . Collaborated w/ Tyler Aas, AWV LPN. Pt scheduled for AWV and f/u with PCP next week. Discussed that  patient may also be due for colonoscopy and DM eye exam quality gaps.  . Consider rosuvastatin 5 mg daily for ASCVD risk reduction. If myalgias develop, reduce to TIW dosing.  . Moving forward, will continue to discuss tobacco cessation  Patient Self Care Activities:  . Patient will take medications as prescribed . Patient will commit to reducing sweet beverage consumption.  Please see past updates related to this goal by clicking on the "Past Updates" button in the selected goal      . Quit smoking / using tobacco     Smoking cessation discussed        Fall Risk Fall Risk  11/18/2019 06/29/2019 05/30/2018 10/09/2017  Falls in the past year? 1 0 No Yes  Number falls in past yr: 0 0 - 1  Comment - - - fell down attic  Injury with Fall? 0 0 - Yes  Follow up - - - Education provided   [place]:  Any stairs in or around the home? Yes   If so, are there any without handrails? No   Home free of loose throw rugs in walkways, pet beds, electrical cords, etc? Yes  Adequate lighting in your home to reduce risk of falls? Yes   ASSISTIVE DEVICES UTILIZED TO PREVENT FALLS:  Life alert? No  Use of a cane, walker or w/c? No  Grab bars in the bathroom? Yes  Shower chair or bench in shower? Yes  Elevated toilet seat or a handicapped toilet? Yes    TIMED  UP AND GO:  Was the test performed? Yes .  Length of time to ambulate 10 feet: 9 sec.   GAIT:  Appearance of gait: Gait steady and fast without the use of an assistive device.  Education: Fall risk prevention has been discussed.  Intervention(s) required? No   DME/home health order needed?  No    Depression Screen PHQ 2/9 Scores 11/18/2019 06/06/2018 05/30/2018 10/09/2017  PHQ - 2 Score 0 6 0 1  PHQ- 9 Score - 23 - -    Cognitive Function     6CIT Screen 11/18/2019 10/09/2017  What Year? 0 points 0 points  What month? 0 points 0 points  What time? 0 points 0 points  Count back from 20 0  points 0 points  Months in reverse 0 points 0 points  Repeat phrase 0 points 10 points  Total Score 0 10    Immunization History  Administered Date(s) Administered  . Influenza,inj,Quad PF,6+ Mos 10/09/2017, 08/21/2019  . Influenza-Unspecified 09/20/2014, 08/25/2015  . Pneumococcal Polysaccharide-23 09/03/2011, 10/06/2012, 08/15/2015  . Tdap 06/04/2016    Qualifies for Shingles Vaccine? Yes  Zostavax completed n/a. Due for Shingrix. Education has been provided regarding the importance of this vaccine. Pt has been advised to call insurance company to determine out of pocket expense. Advised may also receive vaccine at local pharmacy or Health Dept. Verbalized acceptance and understanding.  Tdap: up to date   Flu Vaccine: up to date   Pneumococcal Vaccine: up to date   Screening Tests Health Maintenance  Topic Date Due  . OPHTHALMOLOGY EXAM  11/26/1975  . COLONOSCOPY  11/26/2015  . FOOT EXAM  07/08/2019  . HEMOGLOBIN A1C  03/08/2020  . TETANUS/TDAP  06/04/2026  . INFLUENZA VACCINE  Completed  . PNEUMOCOCCAL POLYSACCHARIDE VACCINE AGE 41-64 HIGH RISK  Completed  . HIV Screening  Completed   Cancer Screenings:  Colorectal Screening: cologuard ordered   Lung Cancer Screening: (Low Dose CT Chest recommended if Age 56-80 years, 30 pack-year currently smoking OR have quit w/in 15years.) does not qualify.     Additional Screening:  Hepatitis C Screening: does not qualify   Dental Screening: Recommended annual dental exams for proper oral hygiene  Community Resource Referral:  CRR required this visit?  No        Plan:  I have personally reviewed and addressed the Medicare Annual Wellness questionnaire and have noted the following in the patient's chart:  A. Medical and social history B. Use of alcohol, tobacco or illicit drugs  C. Current medications and supplements D. Functional ability and status E.  Nutritional status F.  Physical activity G. Advance directives  H. List of other physicians I.  Hospitalizations, surgeries, and ER visits in previous 12 months J.  [place] such as hearing and vision if needed, cognitive and depression L. Referrals and appointments   In addition, I have reviewed and discussed with patient certain preventive protocols, quality metrics, and best practice recommendations. A written personalized care plan for preventive services as well as general preventive health recommendations were provided to patient.   Signed,   Bevelyn Ngo, LPN  15/03/85 Nurse Health Advisor  Nurse Notes: none

## 2019-11-18 NOTE — Patient Instructions (Signed)
Derek Cooley , Thank you for taking time to come for your Medicare Wellness Visit. I appreciate your ongoing commitment to your health goals. Please review the following plan we discussed and let me know if I can assist you in the future.   Screening recommendations/referrals: Colonoscopy: cologuard ordered  Recommended yearly ophthalmology/optometry visit for glaucoma screening and checkup Recommended yearly dental visit for hygiene and checkup  Vaccinations: Influenza vaccine: up to date Pneumococcal vaccine: up to date Tdap vaccine: up to date Shingles vaccine: shingrix eligible     Advanced directives: Advance directive discussed with you today. I have provided a copy for you to complete at home and have notarized. Once this is complete please bring a copy in to our office so we can scan it into your chart.  Conditions/risks identified: diabetic, already in contact with chronic care management team   Next appointment: Follow up in one year for your annual wellness visit.   Preventive Care 40-64 Years, Male Preventive care refers to lifestyle choices and visits with your health care provider that can promote health and wellness. What does preventive care include?  A yearly physical exam. This is also called an annual well check.  Dental exams once or twice a year.  Routine eye exams. Ask your health care provider how often you should have your eyes checked.  Personal lifestyle choices, including:  Daily care of your teeth and gums.  Regular physical activity.  Eating a healthy diet.  Avoiding tobacco and drug use.  Limiting alcohol use.  Practicing safe sex.  Taking low-dose aspirin every day starting at age 75. What happens during an annual well check? The services and screenings done by your health care provider during your annual well check will depend on your age, overall health, lifestyle risk factors, and family history of disease. Counseling  Your health care  provider may ask you questions about your:  Alcohol use.  Tobacco use.  Drug use.  Emotional well-being.  Home and relationship well-being.  Sexual activity.  Eating habits.  Work and work Statistician. Screening  You may have the following tests or measurements:  Height, weight, and BMI.  Blood pressure.  Lipid and cholesterol levels. These may be checked every 5 years, or more frequently if you are over 36 years old.  Skin check.  Lung cancer screening. You may have this screening every year starting at age 2 if you have a 30-pack-year history of smoking and currently smoke or have quit within the past 15 years.  Fecal occult blood test (FOBT) of the stool. You may have this test every year starting at age 4.  Flexible sigmoidoscopy or colonoscopy. You may have a sigmoidoscopy every 5 years or a colonoscopy every 10 years starting at age 72.  Prostate cancer screening. Recommendations will vary depending on your family history and other risks.  Hepatitis C blood test.  Hepatitis B blood test.  Sexually transmitted disease (STD) testing.  Diabetes screening. This is done by checking your blood sugar (glucose) after you have not eaten for a while (fasting). You may have this done every 1-3 years. Discuss your test results, treatment options, and if necessary, the need for more tests with your health care provider. Vaccines  Your health care provider may recommend certain vaccines, such as:  Influenza vaccine. This is recommended every year.  Tetanus, diphtheria, and acellular pertussis (Tdap, Td) vaccine. You may need a Td booster every 10 years.  Zoster vaccine. You may need this after age  60.  Pneumococcal 13-valent conjugate (PCV13) vaccine. You may need this if you have certain conditions and have not been vaccinated.  Pneumococcal polysaccharide (PPSV23) vaccine. You may need one or two doses if you smoke cigarettes or if you have certain conditions. Talk  to your health care provider about which screenings and vaccines you need and how often you need them. This information is not intended to replace advice given to you by your health care provider. Make sure you discuss any questions you have with your health care provider. Document Released: 12/23/2015 Document Revised: 08/15/2016 Document Reviewed: 09/27/2015 Elsevier Interactive Patient Education  2017 [place] Prevention in the Home Falls can cause injuries. They can happen to people of all ages. There are many things you can do to make your home safe and to help prevent falls. What can I do on the outside of my home?  Regularly fix the edges of walkways and driveways and fix any cracks.  Remove anything that might make you trip as you walk through a door, such as a raised step or threshold.  Trim any bushes or trees on the path to your home.  Use bright outdoor lighting.  Clear any walking paths of anything that might make someone trip, such as rocks or tools.  Regularly check to see if handrails are loose or broken. Make sure that both sides of any steps have handrails.  Any raised decks and porches should have guardrails on the edges.  Have any leaves, snow, or ice cleared regularly.  Use sand or salt on walking paths during winter.  Clean up any spills in your garage right away. This includes oil or grease spills. What can I do in the bathroom?  Use night lights.  Install grab bars by the toilet and in the tub and shower. Do not use towel bars as grab bars.  Use non-skid mats or decals in the tub or shower.  If you need to sit down in the shower, use a plastic, non-slip stool.  Keep the floor dry. Clean up any water that spills on the floor as soon as it happens.  Remove soap buildup in the tub or shower regularly.  Attach bath mats securely with double-sided non-slip rug tape.  Do not have throw rugs and other things on the floor that can make you trip.  What can I do in the bedroom?  Use night lights.  Make sure that you have a light by your bed that is easy to reach.  Do not use any sheets or blankets that are too big for your bed. They should not hang down onto the floor.  Have a firm chair that has side arms. You can use this for support while you get dressed.  Do not have throw rugs and other things on the floor that can make you trip. What can I do in the kitchen?  Clean up any spills right away.  Avoid walking on wet floors.  Keep items that you use a lot in easy-to-reach places.  If you need to reach something above you, use a strong step stool that has a grab bar.  Keep electrical cords out of the way.  Do not use floor polish or wax that makes floors slippery. If you must use wax, use non-skid floor wax.  Do not have throw rugs and other things on the floor that can make you trip. What can I do with my stairs?  Do not leave any items  on the stairs.  Make sure that there are handrails on both sides of the stairs and use them. Fix handrails that are broken or loose. Make sure that handrails are as long as the stairways.  Check any carpeting to make sure that it is firmly attached to the stairs. Fix any carpet that is loose or worn.  Avoid having throw rugs at the top or bottom of the stairs. If you do have throw rugs, attach them to the floor with carpet tape.  Make sure that you have a light switch at the top of the stairs and the bottom of the stairs. If you do not have them, ask someone to add them for you. What else can I do to help prevent falls?  Wear shoes that:  Do not have high heels.  Have rubber bottoms.  Are comfortable and fit you well.  Are closed at the toe. Do not wear sandals.  If you use a stepladder:  Make sure that it is fully opened. Do not climb a closed stepladder.  Make sure that both sides of the stepladder are locked into place.  Ask someone to hold it for you, if possible.   Clearly mark and make sure that you can see:  Any grab bars or handrails.  First and last steps.  Where the edge of each step is.  Use tools that help you move around (mobility aids) if they are needed. These include:  Canes.  Walkers.  Scooters.  Crutches.  Turn on the lights when you go into a dark area. Replace any light bulbs as soon as they burn out.  Set up your furniture so you have a clear path. Avoid moving your furniture around.  If any of your floors are uneven, fix them.  If there are any pets around you, be aware of where they are.  Review your medicines with your doctor. Some medicines can make you feel dizzy. This can increase your chance of falling. Ask your doctor what other things that you can do to help prevent falls. This information is not intended to replace advice given to you by your health care provider. Make sure you discuss any questions you have with your health care provider. Document Released: 09/22/2009 Document Revised: 05/03/2016 Document Reviewed: 12/31/2014 Elsevier Interactive Patient Education  2017 [employer].

## 2019-11-18 NOTE — Progress Notes (Signed)
BP (!) 165/99   Pulse 74   Temp 98.5 F (36.9 C) (Temporal)   Ht 5' 6.5" (1.689 m)   Wt 251 lb 12.8 oz (114.2 kg)   SpO2 95%   BMI 40.03 kg/m    Subjective:    Patient ID: Derek Cooley., male    DOB: 1966/05/19, 53 y.o.   MRN: [id number]  HPI: [name]. is a 53 y.o. male  Chief Complaint  Patient presents with  . Hypertension   Patient presenting today for 1 month f/u.   Working with [place] to help with inhaler coverage and medication check ins.   HTN - Home BPs running 150s/80s. Taking his medications faithfully with the exception of lisinopril as they misunderstood to start it at last appt. Under a lot of stress right now and has gained a fair amount of weight the last few months. Not exercising or watching diet.   Several days of indigestion, epigastric pain despite taking protonix faithfully. Denies vomiting, diarrhea, CP.   COPD - has been out of his inhalers all month, breathing much worse since. Wheezing, DOE, intermittent cough. Denies fevers.   Relevant past medical, surgical, family and social history reviewed and updated as indicated. Interim medical history since our last visit reviewed. Allergies and medications reviewed and updated.  Review of Systems  Per HPI unless specifically indicated above     Objective:    BP (!) 165/99   Pulse 74   Temp 98.5 F (36.9 C) (Temporal)   Ht 5' 6.5" (1.689 m)   Wt 251 lb 12.8 oz (114.2 kg)   SpO2 95%   BMI 40.03 kg/m   Wt Readings from Last 3 Encounters:  11/18/19 251 lb 12.8 oz (114.2 kg)  11/18/19 251 lb 12.8 oz (114.2 kg)  08/21/19 238 lb (108 kg)    Physical Exam Vitals and nursing note reviewed.  Constitutional:      Appearance: Normal appearance.  HENT:     Head: Atraumatic.  Eyes:     Extraocular Movements: Extraocular movements intact.     Conjunctiva/sclera: Conjunctivae normal.  Cardiovascular:     Rate and Rhythm: Normal rate and regular rhythm.  Pulmonary:   Effort: Pulmonary effort is normal.     Breath sounds: Wheezing (diffusely) present. No rales.  Abdominal:     General: Bowel sounds are normal.     Palpations: Abdomen is soft.     Tenderness: There is abdominal tenderness (mild epigastric ttp).  Musculoskeletal:        General: Normal range of motion.     Cervical back: Normal range of motion and neck supple.  Skin:    General: Skin is warm and dry.  Neurological:     General: No focal deficit present.     Mental Status: He is oriented to person, place, and time.  Psychiatric:        Mood and Affect: Mood normal.        Thought Content: Thought content normal.        Judgment: Judgment normal.     Results for orders placed or performed in visit on 09/09/19  Microscopic Examination   URINE  Result Value Ref Range   WBC, UA None seen 0 - 5 /hpf   RBC None seen 0 - 2 /hpf   Epithelial Cells (non renal) 0-10 0 - 10 /hpf   Bacteria, UA None seen None seen/Few  UA/M w/rflx Culture, Routine   Specimen: Urine  URINE  Result Value Ref Range   Specific Gravity, UA 1.015 1.005 - 1.030   pH, UA 6.0 5.0 - 7.5   Color, UA Yellow Yellow   Appearance Ur Clear Clear   Leukocytes,UA Negative Negative   Protein,UA Trace (A) Negative/Trace   Glucose, UA Negative Negative   Ketones, UA Negative Negative   RBC, UA Negative Negative   Bilirubin, UA Negative Negative   Urobilinogen, Ur 0.2 0.2 - 1.0 mg/dL   Nitrite, UA Negative Negative   Microscopic Examination See below:   HgB A1c  Result Value Ref Range   Hgb A1c MFr Bld 6.2 (H) 4.8 - 5.6 %   Est. average glucose Bld gHb Est-mCnc 131 mg/dL  Lipid Panel w/o Chol/HDL Ratio  Result Value Ref Range   Cholesterol, Total 202 (H) 100 - 199 mg/dL   Triglycerides 415 (H) 0 - 149 mg/dL   HDL 30 (L) >39 mg/dL   VLDL Cholesterol Cal 70 (H) 5 - 40 mg/dL   LDL Chol Calc (NIH) 102 (H) 0 - 99 mg/dL  Comprehensive metabolic panel  Result Value Ref Range   Glucose 123 (H) 65 - 99 mg/dL   BUN  19 6 - 24 mg/dL   Creatinine, Ser 1.63 (H) 0.76 - 1.27 mg/dL   GFR calc non Af Amer 47 (L) >59 mL/min/1.73   GFR calc Af Amer 55 (L) >59 mL/min/1.73   BUN/Creatinine Ratio 12 9 - 20   Sodium 141 134 - 144 mmol/L   Potassium 3.8 3.5 - 5.2 mmol/L   Chloride 107 (H) 96 - 106 mmol/L   CO2 20 20 - 29 mmol/L   Calcium 9.9 8.7 - 10.2 mg/dL   Total Protein 6.6 6.0 - 8.5 g/dL   Albumin 4.3 3.8 - 4.9 g/dL   Globulin, Total 2.3 1.5 - 4.5 g/dL   Albumin/Globulin Ratio 1.9 1.2 - 2.2   Bilirubin Total 0.3 0.0 - 1.2 mg/dL   Alkaline Phosphatase 94 39 - 117 IU/L   AST 14 0 - 40 IU/L   ALT 16 0 - 44 IU/L      Assessment & Plan:   Problem List Items Addressed This Visit      Cardiovascular and Mediastinum   Hypertension associated with diabetes ([place])    BPs still mildly elevated. Begin close home monitoring and logging. Start lisinopril in addition to remainder of regimen, and f/u in 1 month for recheck        Respiratory   COPD (chronic obstructive pulmonary disease) ([place]) - Primary    Exacerbated off inhalers. Prednisone sent, samples given to bridge. CCM Pharmacist working to help patient with assistance paperwork to get inhalers affordable      Relevant Medications   albuterol (PROVENTIL) (2.5 MG/3ML) 0.083% nebulizer solution   albuterol (VENTOLIN HFA) 108 (90 Base) MCG/ACT inhaler   predniSONE (DELTASONE) 20 MG tablet     Digestive   GERD (gastroesophageal reflux disease)    EKG without acute changes today, low suspicion for new cardiac cause. Will increase protonix to BID prn and work on weight loss, diet improvements. F/u if not improving      Relevant Medications   pantoprazole (PROTONIX) 40 MG tablet    Other Visit Diagnoses    Chest pain, unspecified type       EKG without acute ST or T wave changes today. Likely GERD related   Relevant Orders   EKG 12-Lead (Completed)       Follow up plan: Return in about  4 weeks (around 12/16/2019) for BP f/u.

## 2019-11-18 NOTE — Patient Instructions (Signed)
Visit Information  Goals Addressed            This Visit's Progress     Patient Stated   . "I have a lot of medications" (pt-stated)       Current Barriers:  . Polypharmacy; complex patient with multiple comorbidities including hx cerebral aneurysm, cognitive impairment, depression, chronic headaches, CKD, tobacco abuse, hx polysubstance abuse o Financial Barriers - completed Medicare Extra Help application on [id number] o Meeting w/ AWV RN today . His sister, [name], providers care and manages his medications. Notes that it has become expensive to fill all at the same time.  . Per gaps report, patient overdue for AWV, colonoscopy, DM eye exam, failing adherence measures for RAAS and DM, and SUPD gap.  o COPD: Anoro- has completely run out of this medication as well as albuterol. Provided with sample of Anoro today. Collaboratively decided to apply for Stiol[place] assistance, as patient would not qualify for assistance through [place], but would through [place] o HTN: amlodipine 10 mg daily, carvedilol 12.5 mg BID; note that they have not been taking lisinopril (misunderstood instructions from [place] at last visit);  o Chronic HA: tizanidine 4 mg QAM, 4 mg Qmidday, 12 mg QPM (per HA specialist at [facility]); topiramate 50 mg BID o Anxiety: duloxetine 60 mg BID, hydroxyzine 25 mg TID - notes that they were advised to increase to TID, though updated prescription is needed o T2DM: glipizide XL 5 mg daily o Lipids: fenofibrate 67 mg daily; ASCVD risk 37%; noted intolerance to atorvastatin 10 mg d/t myalgias   Pharmacist Clinical Goal(s):  Marland Kitchen Over the next 90 days, patient will work with PharmD and provider towards optimized medication management  Interventions: . Received patient and provider portions of application. Submitted to [employer] for [employer]. Will pass materials along to [employer], CPhT for follow up. . Discussed need for clarification of hydroxyzine  prescription w/ Merrie Roof, PA . Discussed restarting lisinopril daily w/ Apolonio Schneiders, given elevated BP on exam today. . Recommended rosuvastatin 5 mg daily for ASCVD risk reduction. If myalgias develop, reduce to TIW dosing.   Patient Self Care Activities:  . Patient will take medications as prescribed . Patient will commit to reducing sweet beverage consumption.  Please see past updates related to this goal by clicking on the "Past Updates" button in the selected goal         The patient verbalized understanding of instructions provided today and declined a print copy of patient instruction materials.   Plan: - Will collaborate w/ Susy Frizzle, CPhT on [place] application as above - Will outreach patient in ~3-4 weeks for continued medication management review  Catie Darnelle Maffucci, PharmD, [place] [phone]

## 2019-11-18 NOTE — Chronic Care Management (AMB) (Signed)
Chronic Care Management   Follow Up Note   11/18/2019 Name: Derek Cooley. MRN: [id number] DOB: [birth year]  Referred by: Volney American, PA-C Reason for referral : Chronic Care Management (Medication Management)   Derek Cooley. is a 53 y.o. year old male who is a primary care patient of Volney American, [place]. The CCM team was consulted for assistance with chronic disease management and care coordination needs.    Met with patient and sister, [name], today for medication management access and support.  Review of patient status, including review of consultants reports, relevant laboratory and other test results, and collaboration with appropriate care team members and the patient's provider was performed as part of comprehensive patient evaluation and provision of chronic care management services.    SDOH (Social Determinants of Health) screening performed today: Financial Strain . See Care Plan for related entries.   Outpatient Encounter Medications as of 11/18/2019  Medication Sig Note  . albuterol (PROVENTIL HFA;VENTOLIN HFA) 108 (90 Base) MCG/ACT inhaler Inhale 1-2 puffs into the lungs every 6 (six) hours as needed for wheezing or shortness of breath. (Patient not taking: Reported on 11/18/2019)   . amLODipine (NORVASC) 10 MG tablet Take 1 tablet (10 mg total) by mouth daily. 11/11/2019: QAM  . Blood Glucose Monitoring Suppl (ONE TOUCH ULTRA 2) w/Device KIT    . carvedilol (COREG) 12.5 MG tablet Take 1 tablet (12.5 mg total) by mouth 2 (two) times a day.   . docusate (COLACE) 60 MG/15ML syrup Take 100 mg by mouth 2 (two) times daily as needed.   . doxycycline (VIBRAMYCIN) 100 MG capsule Take 1 capsule (100 mg total) by mouth 2 (two) times daily. 11/11/2019: QAM and Qevening  . DULoxetine (CYMBALTA) 60 MG capsule TAKE 1 CAPSULE TWICE DAILY   . fenofibrate micronized (LOFIBRA) 67 MG capsule Take 1 capsule (67 mg total) by mouth daily before breakfast. 11/11/2019: QAM   . fluticasone (FLONASE) 50 MCG/ACT nasal spray Place 2 sprays into both nostrils daily. (Patient not taking: Reported on 11/18/2019)   . glipiZIDE (GLUCOTROL XL) 5 MG 24 hr tablet TAKE ONE TABLET EACH MORNING WITH BREAKFAST   . hydrOXYzine (VISTARIL) 25 MG capsule Take 1 capsule (25 mg total) by mouth 2 (two) times daily as needed for anxiety. For anxiety attacks (Patient taking differently: Take 25 mg by mouth 3 (three) times daily. For anxiety attacks)   . ibuprofen (ADVIL) 800 MG tablet Take 1 tablet (800 mg total) by mouth every 8 (eight) hours as needed.   Marland Kitchen ketotifen (ZADITOR) 0.025 % ophthalmic solution 1 drop 2 (two) times daily.   . Lancets (ONETOUCH DELICA PLUS [id number]) [place]    . lisinopril (ZESTRIL) 10 MG tablet Take 1 tablet (10 mg total) by mouth daily. (Patient not taking: Reported on 11/18/2019)   . ONETOUCH ULTRA test strip    . pantoprazole (PROTONIX) 40 MG tablet Take 1 tablet (40 mg total) by mouth daily.   . Thiamine HCl (THIAMINE PO) Take 100 mg by mouth daily.   Marland Kitchen tiZANidine (ZANAFLEX) 4 MG tablet TAKE ONE TABLET TWICE DAILY AS NEEDED FOR MUSCLE SPASM   . topiramate (TOPAMAX) 50 MG tablet 50 mg 2 (two) times daily.    Marland Kitchen umeclidinium-vilanterol (ANORO ELLIPTA) 62.5-25 MCG/INH AEPB Inhale 1 puff into the lungs daily. (Patient not taking: Reported on 11/18/2019)    No facility-administered encounter medications on file as of 11/18/2019.      Goals Addressed  This Visit's Progress     Patient Stated   . "I have a lot of medications" (pt-stated)       Current Barriers:  . Polypharmacy; complex patient with multiple comorbidities including hx cerebral aneurysm, cognitive impairment, depression, chronic headaches, CKD, tobacco abuse, hx polysubstance abuse o Financial Barriers - completed Medicare Extra Help application on 62/01/6332 o Meeting w/ AWV RN today . His sister, [name], providers care and manages his medications. Notes that it has become expensive  to fill all at the same time.  . Per gaps report, patient overdue for AWV, colonoscopy, DM eye exam, failing adherence measures for RAAS and DM, and SUPD gap.  o COPD: Anoro- has completely run out of this medication as well as albuterol. Provided with sample of Anoro today. Collaboratively decided to apply for Stiolto assistance, as patient would not qualify for assistance through [place], but would through [place] o HTN: amlodipine 10 mg daily, carvedilol 12.5 mg BID; note that they have not been taking lisinopril (misunderstood instructions from [place] at last visit);  o Chronic HA: tizanidine 4 mg QAM, 4 mg Qmidday, 12 mg QPM (per HA specialist at [facility]); topiramate 50 mg BID o Anxiety: duloxetine 60 mg BID, hydroxyzine 25 mg TID - notes that they were advised to increase to TID, though updated prescription is needed o T2DM: glipizide XL 5 mg daily o Lipids: fenofibrate 67 mg daily; ASCVD risk 37%; noted intolerance to atorvastatin 10 mg d/t myalgias   Pharmacist Clinical Goal(s):  Marland Kitchen Over the next 90 days, patient will work with PharmD and provider towards optimized medication management  Interventions: . Received patient and provider portions of application. Submitted to [employer] for [employer]. Will pass materials along to [employer], CPhT for follow up. . Discussed need for clarification of hydroxyzine prescription w/ Merrie Roof, PA . Discussed restarting lisinopril daily w/ Apolonio Schneiders, given elevated BP on exam today. . Recommended rosuvastatin 5 mg daily for ASCVD risk reduction. If myalgias develop, reduce to TIW dosing.   Patient Self Care Activities:  . Patient will take medications as prescribed . Patient will commit to reducing sweet beverage consumption.  Please see past updates related to this goal by clicking on the "Past Updates" button in the selected goal         Plan: - Will collaborate w/ Susy Frizzle, CPhT on [employer] application as above -  Will outreach patient in ~3-4 weeks for continued medication management review  Catie Darnelle Maffucci, PharmD, [place] [phone]

## 2019-11-22 NOTE — Assessment & Plan Note (Signed)
Exacerbated off inhalers. Prednisone sent, samples given to bridge. CCM Pharmacist working to help patient with assistance paperwork to get inhalers affordable

## 2019-11-22 NOTE — Assessment & Plan Note (Signed)
BPs still mildly elevated. Begin close home monitoring and logging. Start lisinopril in addition to remainder of regimen, and f/u in 1 month for recheck

## 2019-11-22 NOTE — Assessment & Plan Note (Signed)
EKG without acute changes today, low suspicion for new cardiac cause. Will increase protonix to BID prn and work on weight loss, diet improvements. F/u if not improving

## 2019-11-25 ENCOUNTER — Ambulatory Visit: Payer: Self-pay | Admitting: Pharmacist

## 2019-11-25 ENCOUNTER — Other Ambulatory Visit: Payer: Self-pay | Admitting: Pharmacy Technician

## 2019-11-25 ENCOUNTER — Other Ambulatory Visit: Payer: Self-pay | Admitting: Pain Medicine

## 2019-11-25 ENCOUNTER — Ambulatory Visit
Admission: RE | Admit: 2019-11-25 | Discharge: 2019-11-25 | Disposition: A | Discharge status: Home / Self Care | Payer: Medicare Other | Source: Ambulatory Visit | Attending: Pain Medicine | Admitting: Pain Medicine

## 2019-11-25 ENCOUNTER — Ambulatory Visit
Admission: RE | Admit: 2019-11-25 | Discharge: 2019-11-25 | Disposition: A | Discharge status: Home / Self Care | Payer: Medicare Other | Attending: Pain Medicine | Admitting: Pain Medicine

## 2019-11-25 ENCOUNTER — Other Ambulatory Visit: Payer: Self-pay

## 2019-11-25 DIAGNOSIS — M545 Low back pain, unspecified: Secondary | ICD-10-CM

## 2019-11-25 DIAGNOSIS — M1711 Unilateral primary osteoarthritis, right knee: Secondary | ICD-10-CM | POA: Diagnosis not present

## 2019-11-25 DIAGNOSIS — M25562 Pain in left knee: Secondary | ICD-10-CM

## 2019-11-25 DIAGNOSIS — M19012 Primary osteoarthritis, left shoulder: Secondary | ICD-10-CM | POA: Diagnosis not present

## 2019-11-25 DIAGNOSIS — J449 Chronic obstructive pulmonary disease, unspecified: Secondary | ICD-10-CM | POA: Diagnosis not present

## 2019-11-25 DIAGNOSIS — M1712 Unilateral primary osteoarthritis, left knee: Secondary | ICD-10-CM | POA: Diagnosis not present

## 2019-11-25 DIAGNOSIS — M25462 Effusion, left knee: Secondary | ICD-10-CM | POA: Diagnosis not present

## 2019-11-25 DIAGNOSIS — E7800 Pure hypercholesterolemia, unspecified: Secondary | ICD-10-CM

## 2019-11-25 DIAGNOSIS — G894 Chronic pain syndrome: Secondary | ICD-10-CM | POA: Diagnosis not present

## 2019-11-25 IMAGING — CR DG KNEE 1-2V*L*
1 series · 2 of 2 positions shown · non-contrast
Comparison: None.

CLINICAL DATA: Left knee pain. Patient reports fall 2 years ago
with persistent pain.

EXAM:
LEFT KNEE - 1-2 VIEW

[Series 1: dg knee 1-2 views left · 0.14mm/px · 2 of 2 slices shown]
[im 1/2]
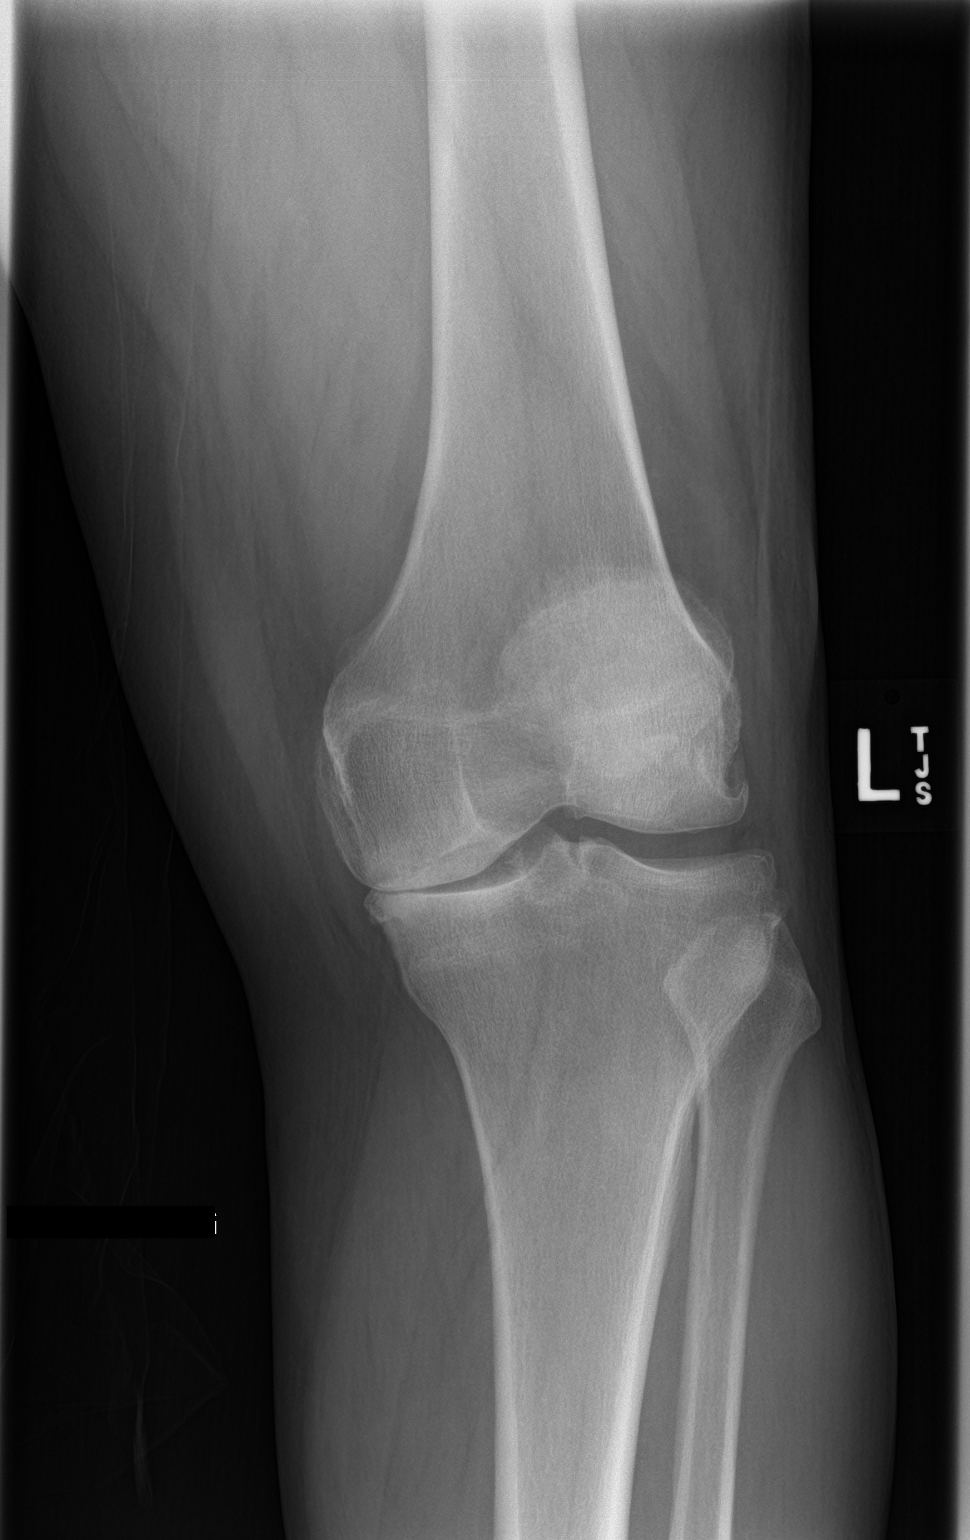
[im 2/2]
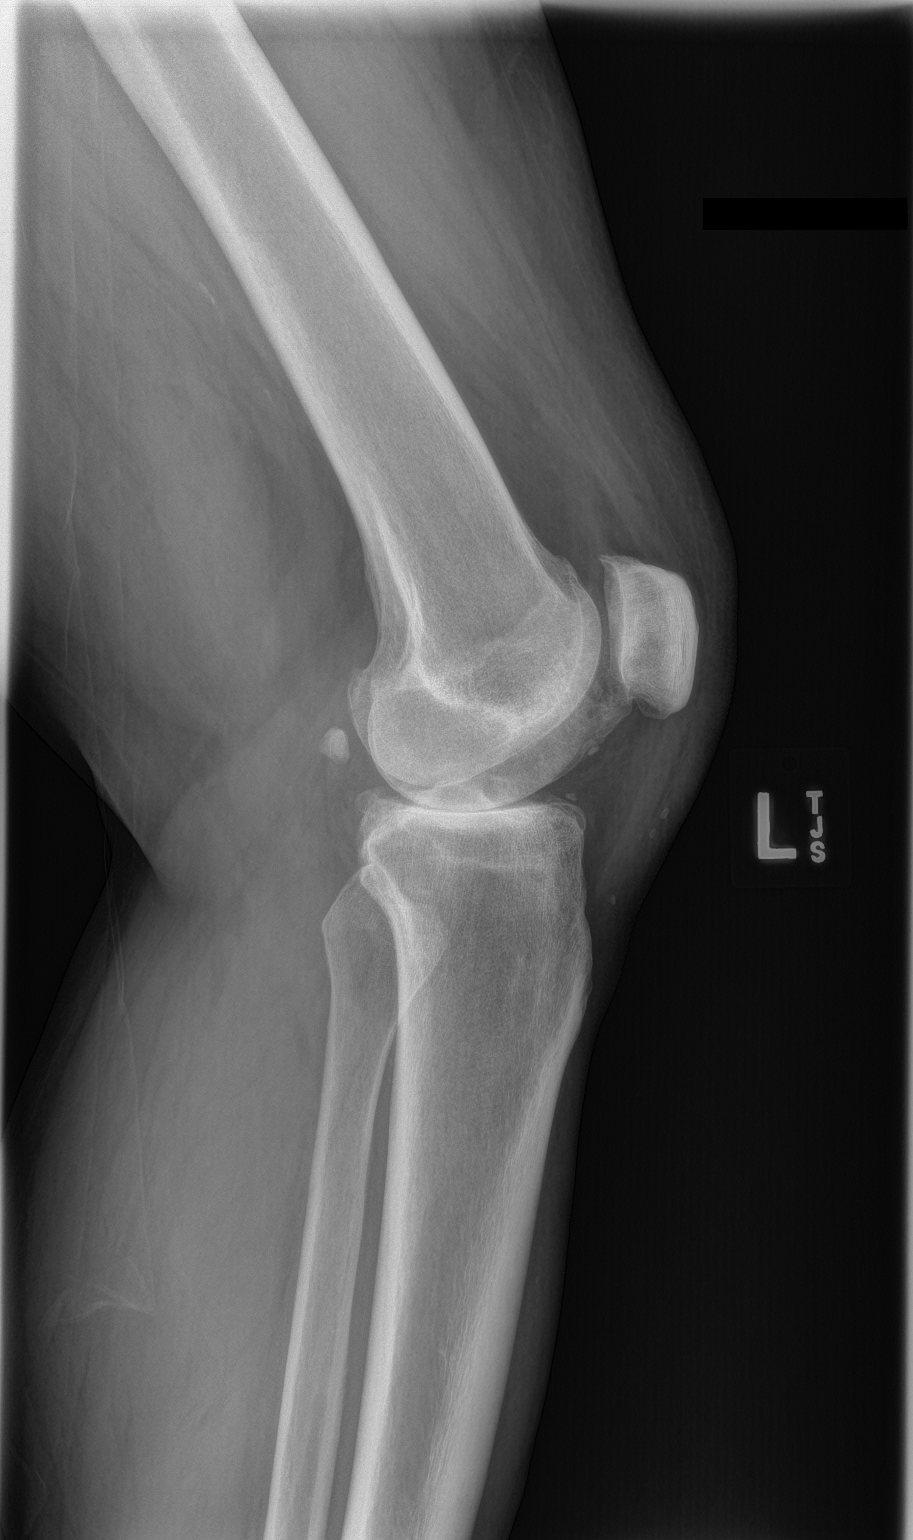

[2 of 2 positions shown; findings below may reference images not displayed]

FINDINGS: Moderate tricompartmental spurring, advanced for age. Prominent
medial tibiofemoral joint space narrowing with near complete joint
space loss medially. Small knee joint effusion. Possible tiny
ossified bodies in the joint. No erosions or bony destructive
change. No focal soft tissue abnormality.
IMPRESSION: Moderate tricompartmental osteoarthritis, advanced for age. Small
knee joint effusion. Possible tiny ossified bodies in the joint.

## 2019-11-25 IMAGING — CR DG LUMBAR SPINE COMPLETE 4+V
1 series · 6 of 6 positions shown · non-contrast
Comparison: Radiographs [DATE]

CLINICAL DATA: Lumbosacral back pain. Fall 2 years ago with
persistent low back pain radiating down both legs posteriorly.

EXAM:
LUMBAR SPINE - COMPLETE 4+ VIEW

[Series 1: dg lumbar spine complete 4 +v · 0.14mm/px · 6 of 6 slices shown]
[im 1/6]
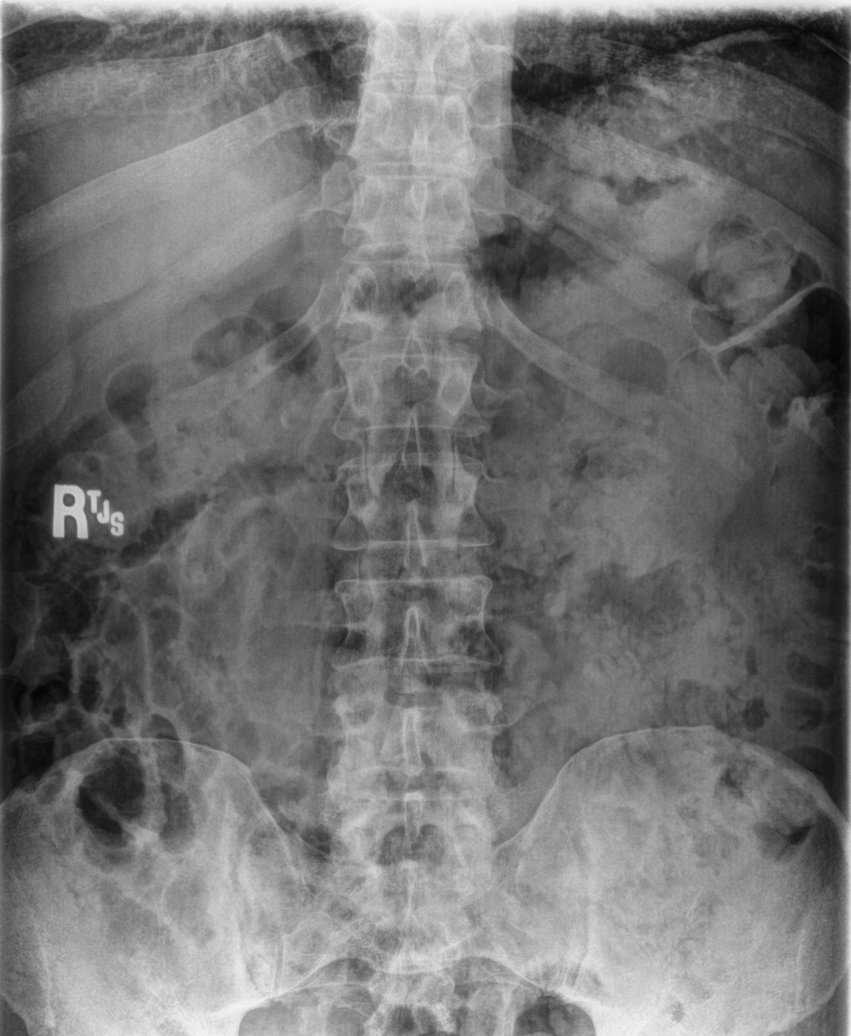
[im 2/6]
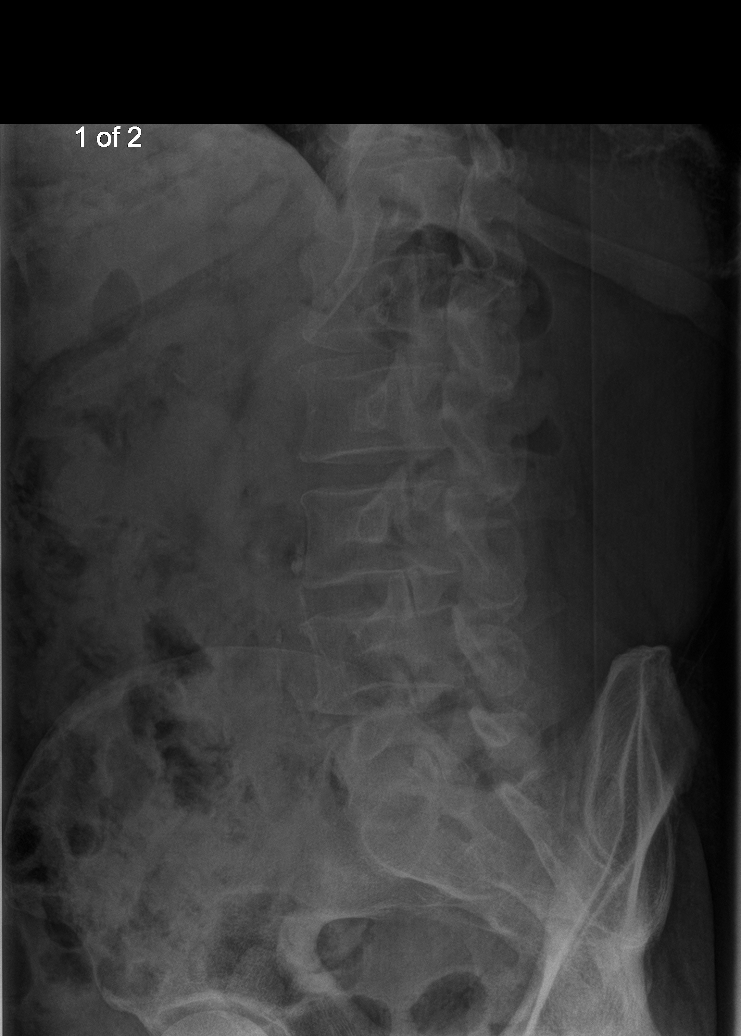
[im 3/6]
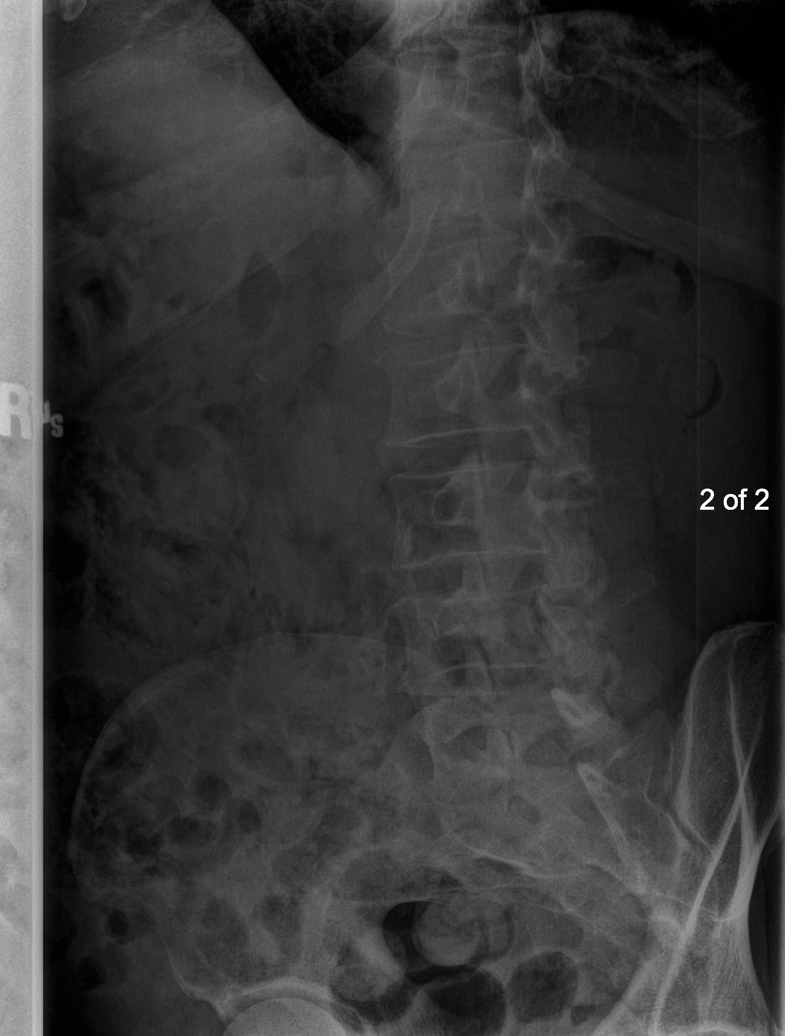
[im 4/6]
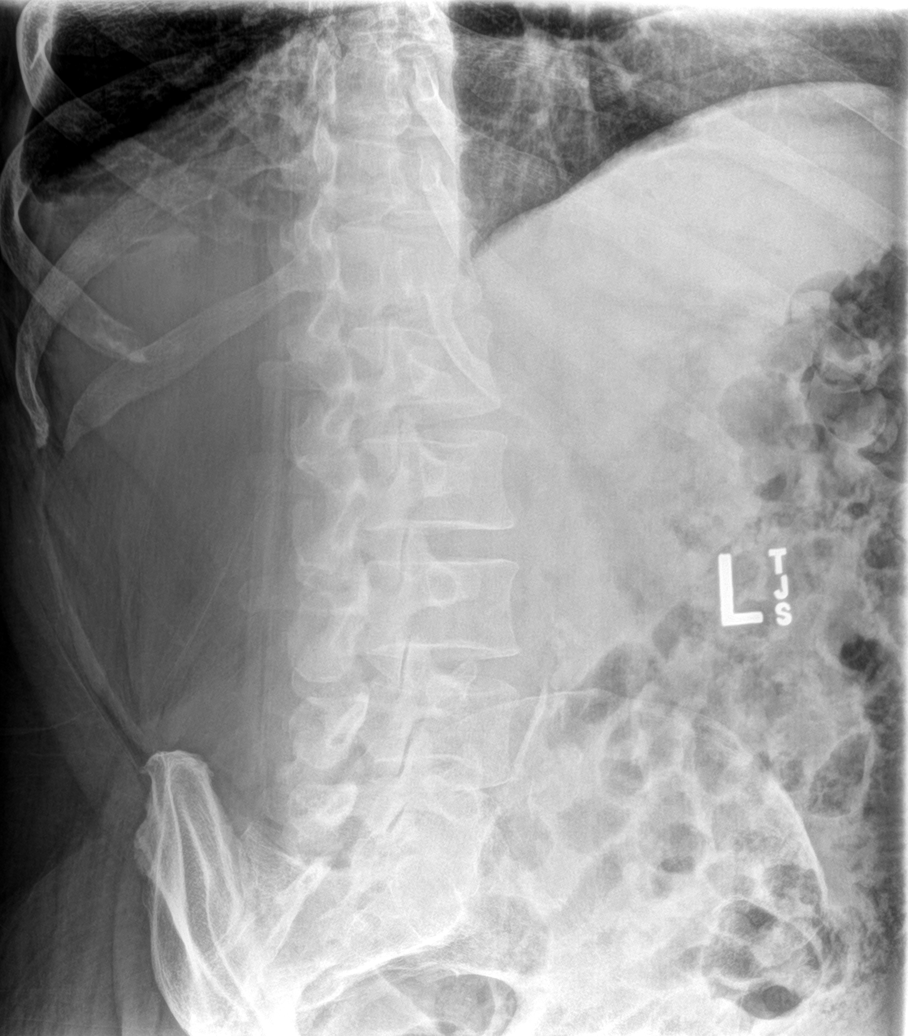
[im 5/6]
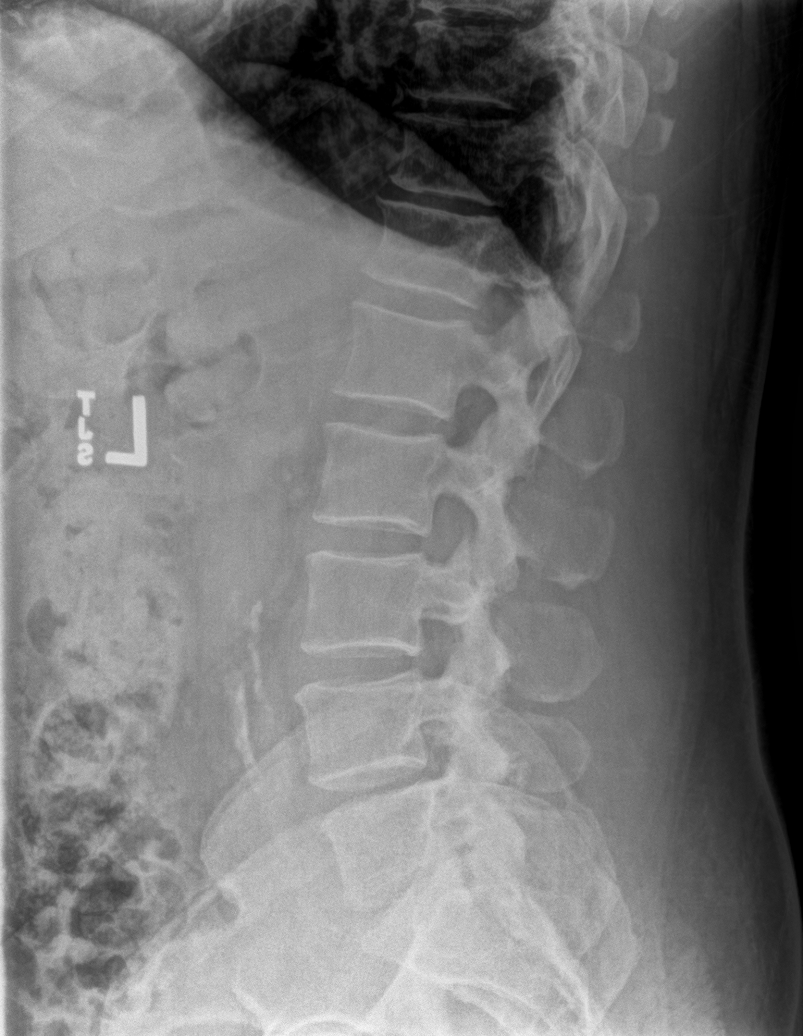
[im 6/6]
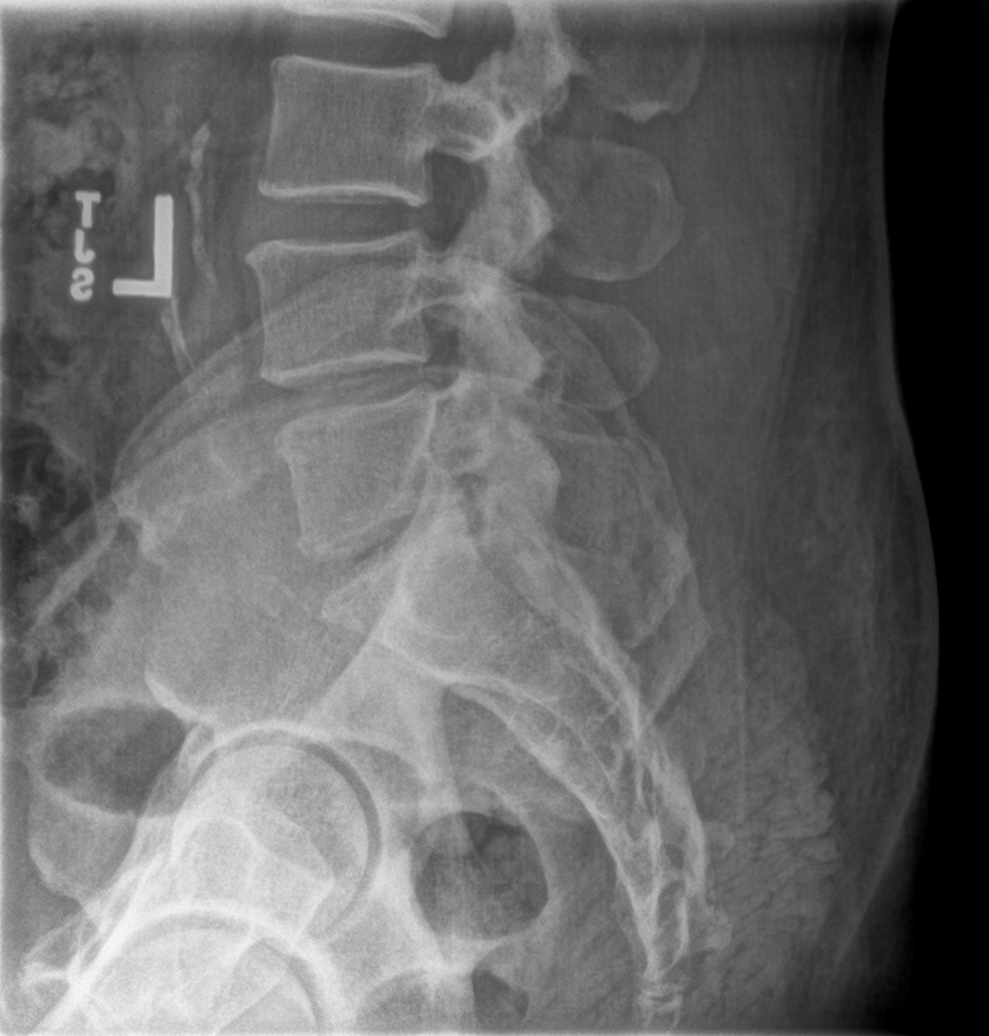

[6 of 6 positions shown; findings below may reference images not displayed]

FINDINGS: The alignment is maintained. Vertebral body heights are normal.
There is no listhesis. The posterior elements are intact. Mild disc
space narrowing and endplate spurring at L4-L5 and L5-S1. Mild lower
lumbar facet hypertrophy. No fracture. Vascular calcifications of
the abdominal aorta. Unchanged curvilinear calcification projecting
to the right of L1 transverse process. Sacroiliac joints are
congruent.
IMPRESSION: 1. Mild degenerative change in the lower lumbar spine.
2. No significant change from lumbar radiographs [DATE].

## 2019-11-25 NOTE — Chronic Care Management (AMB) (Signed)
Chronic Care Management   Follow Up Note   11/25/2019 Name: [name] MRN: [id number] DOB: 1966-09-19  Referred by: Volney American, PA-C Reason for referral : Chronic Care Management (Medication Management)   [name] is a 53 y.o. year old male who is a primary care patient of Volney American, [place]. The CCM team was consulted for assistance with chronic disease management and care coordination needs.    Care coordination completed today.   Review of patient status, including review of consultants reports, relevant laboratory and other test results, and collaboration with appropriate care team members and the patient's provider was performed as part of comprehensive patient evaluation and provision of chronic care management services.    SDOH (Social Determinants of Health) screening performed today: Financial Strain . See Care Plan for related entries.   Outpatient Encounter Medications as of 11/25/2019  Medication Sig Note  . albuterol (PROVENTIL) (2.5 MG/3ML) 0.083% nebulizer solution Take 3 mLs (2.5 mg total) by nebulization every 6 (six) hours as needed for wheezing or shortness of breath.   Marland Kitchen albuterol (VENTOLIN HFA) 108 (90 Base) MCG/ACT inhaler Inhale 1-2 puffs into the lungs every 6 (six) hours as needed for wheezing or shortness of breath.   Marland Kitchen amLODipine (NORVASC) 10 MG tablet Take 1 tablet (10 mg total) by mouth daily. 11/11/2019: QAM  . Blood Glucose Monitoring Suppl (ONE TOUCH ULTRA 2) w/Device KIT    . carvedilol (COREG) 12.5 MG tablet Take 1 tablet (12.5 mg total) by mouth 2 (two) times a day.   . docusate (COLACE) 60 MG/15ML syrup Take 100 mg by mouth 2 (two) times daily as needed.   . doxycycline (VIBRAMYCIN) 100 MG capsule Take 1 capsule (100 mg total) by mouth 2 (two) times daily. 11/11/2019: QAM and Qevening  . DULoxetine (CYMBALTA) 60 MG capsule TAKE 1 CAPSULE TWICE DAILY   . fenofibrate micronized (LOFIBRA) 67 MG capsule Take 1  capsule (67 mg total) by mouth daily before breakfast. 11/11/2019: QAM  . fluticasone (FLONASE) 50 MCG/ACT nasal spray Place 2 sprays into both nostrils daily. (Patient not taking: Reported on 11/18/2019)   . glipiZIDE (GLUCOTROL XL) 5 MG 24 hr tablet TAKE ONE TABLET EACH MORNING WITH BREAKFAST   . hydrOXYzine (VISTARIL) 25 MG capsule Take 1 capsule (25 mg total) by mouth 2 (two) times daily as needed for anxiety. For anxiety attacks (Patient taking differently: Take 25 mg by mouth 3 (three) times daily. For anxiety attacks)   . ibuprofen (ADVIL) 800 MG tablet Take 1 tablet (800 mg total) by mouth every 8 (eight) hours as needed.   Marland Kitchen ketotifen (ZADITOR) 0.025 % ophthalmic solution 1 drop 2 (two) times daily.   . Lancets (ONETOUCH DELICA PLUS [id number]) [place]    . lisinopril (ZESTRIL) 10 MG tablet Take 1 tablet (10 mg total) by mouth daily. (Patient not taking: Reported on 11/18/2019)   . ONETOUCH ULTRA test strip    . pantoprazole (PROTONIX) 40 MG tablet Take 1 tablet (40 mg total) by mouth daily.   . pantoprazole (PROTONIX) 40 MG tablet Take 1 tablet (40 mg total) by mouth 2 (two) times daily as needed.   . predniSONE (DELTASONE) 20 MG tablet Take 2 tablets (40 mg total) by mouth daily with breakfast.   . Thiamine HCl (THIAMINE PO) Take 100 mg by mouth daily.   Marland Kitchen tiZANidine (ZANAFLEX) 4 MG tablet TAKE ONE TABLET TWICE DAILY AS NEEDED FOR MUSCLE SPASM   . topiramate (TOPAMAX) 50  MG tablet 50 mg 2 (two) times daily.    Marland Kitchen umeclidinium-vilanterol (ANORO ELLIPTA) 62.5-25 MCG/INH AEPB Inhale 1 puff into the lungs daily. (Patient not taking: Reported on 11/18/2019)    No facility-administered encounter medications on file as of 11/25/2019.     Goals Addressed            This Visit's Progress     Patient Stated   . "I have a lot of medications" (pt-stated)       Current Barriers:  . Polypharmacy; complex patient with multiple comorbidities including hx cerebral aneurysm, cognitive impairment,  depression, chronic headaches, CKD, tobacco abuse, hx polysubstance abuse . His sister, [name], providers care and manages his medications. Notes that it has become expensive to fill all at the same time.  o COPD: Anoro, though collaboratively decided to apply for [employer] assistance through [employer], as patient would not qualifiy for [place] assistance for Anoro due to their out of pocket spend requirement o HTN: amlodipine 10 mg daily, carvedilol 12.5 mg BID, restarted lisinopril 10 mg daily at last visit with Apolonio Schneiders o Chronic HA: tizanidine 4 mg QAM, 4 mg Qmidday, 12 mg QPM (per HA specialist at [facility]); topiramate 50 mg BID o Anxiety: duloxetine 60 mg BID, hydroxyzine 25 mg TID  o T2DM: glipizide XL 5 mg daily o Lipids: fenofibrate 67 mg daily; ASCVD risk 37%; noted intolerance to atorvastatin 10 mg d/t myalgias   Pharmacist Clinical Goal(s):  Marland Kitchen Over the next 90 days, patient will work with PharmD and provider towards optimized medication management  Interventions: . Received message from [facility], CPhT that BI will not accept bank statement to process application. They will require proof of income from social security.  Marland Kitchen Contacted patient's caregiver, [name], and left message with that information. Requested she return my call when able to discuss . Recommend starting rosuvastatin 5 mg daily for ASCVD risk reduction  Patient Self Care Activities:  . Patient will take medications as prescribed . Patient will commit to reducing sweet beverage consumption.  Please see past updates related to this goal by clicking on the "Past Updates" button in the selected goal          Plan: - Will continue to collaborate w/ CPhT, patient, and [name] as above  Catie Darnelle Maffucci, PharmD, [place] [phone]

## 2019-11-25 NOTE — Patient Instructions (Signed)
Visit Information  Goals Addressed            This Visit's Progress     Patient Stated   . "I have a lot of medications" (pt-stated)       Current Barriers:  . Polypharmacy; complex patient with multiple comorbidities including hx cerebral aneurysm, cognitive impairment, depression, chronic headaches, CKD, tobacco abuse, hx polysubstance abuse . His sister, [name], providers care and manages his medications. Notes that it has become expensive to fill all at the same time.  o COPD: Anoro, though collaboratively decided to apply for [employer] assistance through [employer], as patient would not qualifiy for [place] assistance for Anoro due to their out of pocket spend requirement o HTN: amlodipine 10 mg daily, carvedilol 12.5 mg BID, restarted lisinopril 10 mg daily at last visit with Apolonio Schneiders o Chronic HA: tizanidine 4 mg QAM, 4 mg Qmidday, 12 mg QPM (per HA specialist at [facility]); topiramate 50 mg BID o Anxiety: duloxetine 60 mg BID, hydroxyzine 25 mg TID  o T2DM: glipizide XL 5 mg daily o Lipids: fenofibrate 67 mg daily; ASCVD risk 37%; noted intolerance to atorvastatin 10 mg d/t myalgias   Pharmacist Clinical Goal(s):  Marland Kitchen Over the next 90 days, patient will work with PharmD and provider towards optimized medication management  Interventions: . Received message from [facility], CPhT that BI will not accept bank statement to process application. They will require proof of income from social security.  Marland Kitchen Contacted patient's caregiver, [name], and left message with that information. Requested she return my call when able to discuss . Recommend starting rosuvastatin 5 mg daily for ASCVD risk reduction  Patient Self Care Activities:  . Patient will take medications as prescribed . Patient will commit to reducing sweet beverage consumption.  Please see past updates related to this goal by clicking on the "Past Updates" button in the selected goal         The patient verbalized  understanding of instructions provided today and declined a print copy of patient instruction materials.  Plan: - Will continue to collaborate w/ CPhT, patient, and [name] as above  Catie Darnelle Maffucci, PharmD, [place] [phone]

## 2019-11-25 NOTE — Patient Outreach (Signed)
[facility]) Care Management  11/25/2019  [place]. 18-Jan-1966 [id number]  Care coordination call placed to [employer] in regards to patient's application for [place].  Spoke to Mya who informed patient has been denied due to invalid proof of income submitted. Mya informed bank statements are not an acceptable proof of income. She informed the acceptable forms of proof of income include any awards type letter such as Fish farm manager or pensions, any type of tax documents such as the 1040 or 1099, check stubs within the last 90 days but it must be a full months worth. So for example, if the patient gets paid twice in 1 month, then must have 2 pay stubs showing the 2 dates within that month time frame. She informed once that information is received then they can re process the application.  Will route note to embedded pharmacist Catie Darnelle Maffucci for assistance.  Derek Cooley, [place] Management [phone]

## 2019-12-09 ENCOUNTER — Other Ambulatory Visit: Payer: Self-pay | Admitting: Family Medicine

## 2019-12-09 ENCOUNTER — Ambulatory Visit: Payer: Self-pay | Admitting: Pharmacist

## 2019-12-09 DIAGNOSIS — J449 Chronic obstructive pulmonary disease, unspecified: Secondary | ICD-10-CM

## 2019-12-09 NOTE — Patient Instructions (Signed)
Visit Information  Goals Addressed            This Visit's Progress     Patient Stated   . "I have a lot of medications" (pt-stated)       Current Barriers:  . Polypharmacy; complex patient with multiple comorbidities including hx cerebral aneurysm, cognitive impairment, depression, chronic headaches, CKD, tobacco abuse, hx polysubstance abuse . His sister, [name], providers care and manages his medications. Notes that it has become expensive to fill all at the same time.  o COPD: Anoro, though collaboratively decided to apply for [employer] assistance through [employer], as patient would not qualifiy for [place] assistance for Anoro due to their out of pocket spend requirement. Submitted to [facility], however, BI would not accept the bank statement print out as proof of income.   o HTN: amlodipine 10 mg daily, carvedilol 12.5 mg BID, restarted lisinopril 10 mg daily at last visit with Apolonio Schneiders o Chronic HA: tizanidine 4 mg QAM, 4 mg Qmidday, 12 mg QPM (per HA specialist at [facility]); topiramate 50 mg BID o Anxiety: duloxetine 60 mg BID, hydroxyzine 25 mg TID  o T2DM: glipizide XL 5 mg daily o Lipids: fenofibrate 67 mg daily; ASCVD risk 37%; noted intolerance to atorvastatin 10 mg d/t myalgias   Pharmacist Clinical Goal(s):  Marland Kitchen Over the next 90 days, patient will work with PharmD and provider towards optimized medication management  Interventions: . Contacted [name] again to discuss the above. She noted that patient has received paperwork from [facility] noting his income for 2021, and she will bring a copy of that by clinic for me to pass to [employer], CPhT to resubmit to [employer].  . Recommend starting rosuvastatin 5 mg daily for ASCVD risk reduction  Patient Self Care Activities:  . Patient will take medications as prescribed . Patient will commit to reducing sweet beverage consumption.  Please see past updates related to this goal by clicking on the "Past Updates" button in the selected  goal         The patient verbalized understanding of instructions provided today and declined a print copy of patient instruction materials.   Plan: - Will collaborate w/ patient, caregiver, and CPhT as above - Will outreach patient for follow up as previously scheduled.  Catie Darnelle Maffucci, PharmD, [place] [phone]

## 2019-12-09 NOTE — Chronic Care Management (AMB) (Signed)
Chronic Care Management   Follow Up Note   12/09/2019 Name: Ruger Saxer. MRN: [id number] DOB: [birth year]  Referred by: Volney American, PA-C Reason for referral : Chronic Care Management (Medication Management)   [name] is a 53 y.o. year old male who is a primary care patient of Volney American, [place]. The CCM team was consulted for assistance with chronic disease management and care coordination needs.    Care coordination completed today.   Review of patient status, including review of consultants reports, relevant laboratory and other test results, and collaboration with appropriate care team members and the patient's provider was performed as part of comprehensive patient evaluation and provision of chronic care management services.    SDOH (Social Determinants of Health) screening performed today: Financial Strain . See Care Plan for related entries.   Outpatient Encounter Medications as of 12/09/2019  Medication Sig Note  . albuterol (PROVENTIL) (2.5 MG/3ML) 0.083% nebulizer solution Take 3 mLs (2.5 mg total) by nebulization every 6 (six) hours as needed for wheezing or shortness of breath.   Marland Kitchen albuterol (VENTOLIN HFA) 108 (90 Base) MCG/ACT inhaler Inhale 1-2 puffs into the lungs every 6 (six) hours as needed for wheezing or shortness of breath.   Marland Kitchen amLODipine (NORVASC) 10 MG tablet Take 1 tablet (10 mg total) by mouth daily. 11/11/2019: QAM  . Blood Glucose Monitoring Suppl (ONE TOUCH ULTRA 2) w/Device KIT    . carvedilol (COREG) 12.5 MG tablet Take 1 tablet (12.5 mg total) by mouth 2 (two) times a day.   . docusate (COLACE) 60 MG/15ML syrup Take 100 mg by mouth 2 (two) times daily as needed.   . doxycycline (VIBRAMYCIN) 100 MG capsule Take 1 capsule (100 mg total) by mouth 2 (two) times daily. 11/11/2019: QAM and Qevening  . DULoxetine (CYMBALTA) 60 MG capsule TAKE 1 CAPSULE TWICE DAILY   . fenofibrate micronized (LOFIBRA) 67 MG capsule Take 1  capsule (67 mg total) by mouth daily before breakfast. 11/11/2019: QAM  . fluticasone (FLONASE) 50 MCG/ACT nasal spray Place 2 sprays into both nostrils daily. (Patient not taking: Reported on 11/18/2019)   . glipiZIDE (GLUCOTROL XL) 5 MG 24 hr tablet TAKE ONE TABLET EACH MORNING WITH BREAKFAST   . hydrOXYzine (VISTARIL) 25 MG capsule Take 1 capsule (25 mg total) by mouth 2 (two) times daily as needed for anxiety. For anxiety attacks (Patient taking differently: Take 25 mg by mouth 3 (three) times daily. For anxiety attacks)   . ibuprofen (ADVIL) 800 MG tablet Take 1 tablet (800 mg total) by mouth every 8 (eight) hours as needed.   Marland Kitchen ketotifen (ZADITOR) 0.025 % ophthalmic solution 1 drop 2 (two) times daily.   . Lancets (ONETOUCH DELICA PLUS [id number]) [place]    . lisinopril (ZESTRIL) 10 MG tablet Take 1 tablet (10 mg total) by mouth daily. (Patient not taking: Reported on 11/18/2019)   . ONETOUCH ULTRA test strip    . pantoprazole (PROTONIX) 40 MG tablet Take 1 tablet (40 mg total) by mouth daily.   . pantoprazole (PROTONIX) 40 MG tablet Take 1 tablet (40 mg total) by mouth 2 (two) times daily as needed.   . predniSONE (DELTASONE) 20 MG tablet Take 2 tablets (40 mg total) by mouth daily with breakfast.   . Thiamine HCl (THIAMINE PO) Take 100 mg by mouth daily.   Marland Kitchen tiZANidine (ZANAFLEX) 4 MG tablet TAKE ONE TABLET TWICE DAILY AS NEEDED FOR MUSCLE SPASM   . topiramate (TOPAMAX) 50  MG tablet 50 mg 2 (two) times daily.    Marland Kitchen umeclidinium-vilanterol (ANORO ELLIPTA) 62.5-25 MCG/INH AEPB Inhale 1 puff into the lungs daily. (Patient not taking: Reported on 11/18/2019)    No facility-administered encounter medications on file as of 12/09/2019.     Goals Addressed            This Visit's Progress     Patient Stated   . "I have a lot of medications" (pt-stated)       Current Barriers:  . Polypharmacy; complex patient with multiple comorbidities including hx cerebral aneurysm, cognitive impairment,  depression, chronic headaches, CKD, tobacco abuse, hx polysubstance abuse . His sister, [name], providers care and manages his medications. Notes that it has become expensive to fill all at the same time.  o COPD: Anoro, though collaboratively decided to apply for [employer] assistance through [employer], as patient would not qualifiy for [place] assistance for Anoro due to their out of pocket spend requirement. Submitted to [facility], however, BI would not accept the bank statement print out as proof of income.   o HTN: amlodipine 10 mg daily, carvedilol 12.5 mg BID, restarted lisinopril 10 mg daily at last visit with Apolonio Schneiders o Chronic HA: tizanidine 4 mg QAM, 4 mg Qmidday, 12 mg QPM (per HA specialist at [facility]); topiramate 50 mg BID o Anxiety: duloxetine 60 mg BID, hydroxyzine 25 mg TID  o T2DM: glipizide XL 5 mg daily o Lipids: fenofibrate 67 mg daily; ASCVD risk 37%; noted intolerance to atorvastatin 10 mg d/t myalgias   Pharmacist Clinical Goal(s):  Marland Kitchen Over the next 90 days, patient will work with PharmD and provider towards optimized medication management  Interventions: . Contacted [name] again to discuss the above. She noted that patient has received paperwork from [facility] noting his income for 2021, and she will bring a copy of that by clinic for me to pass to [employer], CPhT to resubmit to [employer].  . Recommend starting rosuvastatin 5 mg daily for ASCVD risk reduction  Patient Self Care Activities:  . Patient will take medications as prescribed . Patient will commit to reducing sweet beverage consumption.  Please see past updates related to this goal by clicking on the "Past Updates" button in the selected goal          Plan: - Will collaborate w/ patient, caregiver, and CPhT as above - Will outreach patient for follow up as previously scheduled.  Catie Darnelle Maffucci, PharmD, [place] [phone]

## 2019-12-10 NOTE — Telephone Encounter (Signed)
Forwarding medication refill request to PCP for review - Refills : Not specified.

## 2019-12-23 ENCOUNTER — Encounter: Payer: Self-pay | Admitting: Family Medicine

## 2019-12-23 ENCOUNTER — Ambulatory Visit (INDEPENDENT_AMBULATORY_CARE_PROVIDER_SITE_OTHER): Payer: Medicare Other | Admitting: Pharmacist

## 2019-12-23 ENCOUNTER — Other Ambulatory Visit: Payer: Self-pay

## 2019-12-23 ENCOUNTER — Ambulatory Visit (INDEPENDENT_AMBULATORY_CARE_PROVIDER_SITE_OTHER): Payer: Medicare Other | Admitting: Family Medicine

## 2019-12-23 VITALS — BP 162/98 | HR 98 | Ht 67.0 in | Wt 252.0 lb

## 2019-12-23 DIAGNOSIS — E119 Type 2 diabetes mellitus without complications: Secondary | ICD-10-CM

## 2019-12-23 DIAGNOSIS — I10 Essential (primary) hypertension: Secondary | ICD-10-CM | POA: Diagnosis not present

## 2019-12-23 DIAGNOSIS — E1159 Type 2 diabetes mellitus with other circulatory complications: Secondary | ICD-10-CM | POA: Diagnosis not present

## 2019-12-23 DIAGNOSIS — J449 Chronic obstructive pulmonary disease, unspecified: Secondary | ICD-10-CM

## 2019-12-23 DIAGNOSIS — I152 Hypertension secondary to endocrine disorders: Secondary | ICD-10-CM

## 2019-12-23 NOTE — Chronic Care Management (AMB) (Signed)
Chronic Care Management   Follow Up Note   12/23/2019 Name: Derek Cooley. MRN: [id number] DOB: 1966/02/25  Referred by: Volney American, PA-C Reason for referral : Chronic Care Management (Medication Management)   Derek Cooley. is a 54 y.o. year old male who is a primary care patient of Volney American, [place]. The CCM team was consulted for assistance with chronic disease management and care coordination needs.    Contacted patient's caregiver, [name], regarding medication management and access needs.   Review of patient status, including review of consultants reports, relevant laboratory and other test results, and collaboration with appropriate care team members and the patient's provider was performed as part of comprehensive patient evaluation and provision of chronic care management services.    SDOH (Social Determinants of Health) screening performed today: Financial Strain  Tobacco Use. See Care Plan for related entries.   Outpatient Encounter Medications as of 12/23/2019  Medication Sig Note  . amLODipine (NORVASC) 10 MG tablet Take 1 tablet (10 mg total) by mouth daily. 11/11/2019: QAM  . Blood Glucose Monitoring Suppl (ONE TOUCH ULTRA 2) w/Device KIT    . carvedilol (COREG) 12.5 MG tablet Take 1 tablet (12.5 mg total) by mouth 2 (two) times a day.   Marland Kitchen doxycycline (VIBRAMYCIN) 100 MG capsule Take 1 capsule (100 mg total) by mouth 2 (two) times daily. 11/11/2019: QAM and Qevening  . glipiZIDE (GLUCOTROL XL) 5 MG 24 hr tablet TAKE ONE TABLET EACH MORNING WITH BREAKFAST   . lisinopril (ZESTRIL) 10 MG tablet Take 1 tablet (10 mg total) by mouth daily.   Marland Kitchen umeclidinium-vilanterol (ANORO ELLIPTA) 62.5-25 MCG/INH AEPB Inhale 1 puff into the lungs daily.   Marland Kitchen albuterol (PROVENTIL) (2.5 MG/3ML) 0.083% nebulizer solution Take 3 mLs (2.5 mg total) by nebulization every 6 (six) hours as needed for wheezing or shortness of breath.   Marland Kitchen albuterol (VENTOLIN HFA) 108 (90  Base) MCG/ACT inhaler Inhale 1-2 puffs into the lungs every 6 (six) hours as needed for wheezing or shortness of breath.   . docusate (COLACE) 60 MG/15ML syrup Take 100 mg by mouth 2 (two) times daily as needed.   . DULoxetine (CYMBALTA) 60 MG capsule TAKE 1 CAPSULE TWICE DAILY   . fenofibrate micronized (LOFIBRA) 67 MG capsule Take 1 capsule (67 mg total) by mouth daily before breakfast. 11/11/2019: QAM  . fluticasone (FLONASE) 50 MCG/ACT nasal spray Place 2 sprays into both nostrils daily.   . hydrOXYzine (VISTARIL) 25 MG capsule Take 1 capsule (25 mg total) by mouth 2 (two) times daily as needed for anxiety. For anxiety attacks (Patient taking differently: Take 25 mg by mouth 3 (three) times daily. For anxiety attacks)   . ibuprofen (ADVIL) 800 MG tablet Take 1 tablet (800 mg total) by mouth every 8 (eight) hours as needed.   Marland Kitchen ketotifen (ZADITOR) 0.025 % ophthalmic solution 1 drop 2 (two) times daily.   . Lancets (ONETOUCH DELICA PLUS [id number]) [place]    . ONETOUCH ULTRA test strip    . pantoprazole (PROTONIX) 40 MG tablet Take 1 tablet (40 mg total) by mouth 2 (two) times daily as needed.   . Thiamine HCl (THIAMINE PO) Take 100 mg by mouth daily.   Marland Kitchen tiZANidine (ZANAFLEX) 4 MG tablet TAKE ONE TABLET TWICE DAILY AS NEEDED FOR MUSCLE SPASM   . topiramate (TOPAMAX) 50 MG tablet 50 mg 2 (two) times daily.     No facility-administered encounter medications on file as of 12/23/2019.  Goals Addressed            This Visit's Progress     Patient Stated   . PharmD "I have a lot of medications" (pt-stated)       Current Barriers:  . Polypharmacy; complex patient with multiple comorbidities including hx cerebral aneurysm, cognitive impairment, depression, chronic headaches, CKD, tobacco abuse, hx polysubstance abuse . His sister, [name], providers care and manages his medications. Does note some cost concerns.   o COPD/tobacco abuse: Anoro, though collaboratively decided to apply for  [employer] assistance through [employer], as patient would not qualifiy for [place] assistance for Anoro due to their out of pocket spend requirement. BI requires different proof of income than we previously submitted. [name] hasn't had a chance to bring updated proof of income by clinic yet - Breathing has improved since restarting Anoro sample. [name] notes that patient declines considering tobacco cessation at this time. o HTN: amlodipine 10 mg daily, carvedilol 12.5 mg BID, lisinopril 10 mg daily - had visit w/ PCP today to discuss BP. Patient starts suboxone therapy tomorrow, and will evaluate if this impacts BP. If still elevated, will increase lisinopril to 20 mg daily  o Chronic HA: tizanidine 4 mg QAM, 4 mg Qmidday, 12 mg QPM (per HA specialist at [facility]); topiramate 50 mg BID o Anxiety: duloxetine 60 mg BID, hydroxyzine 25 mg TID  o T2DM: glipizide XL 5 mg daily; due for updated A1c. [name] notes he has gained some weight, and sugars are a bit more elevated. Denies hypoglycemia - Hx GI side effects w/ metformin o Lipids: fenofibrate 67 mg daily; ASCVD risk 37%; noted intolerance to atorvastatin 10 mg d/t myalgias   Pharmacist Clinical Goal(s):  Marland Kitchen Over the next 90 days, patient will work with PharmD and provider towards optimized medication management  Interventions: . Discussed need for updated proof of income to apply for [facility] assistance. [name] will bring this by clinic to have a copy made for me later this week. I will collaborate w/ Susy Frizzle, CPhT to resubmit to [facility] . Discussed need for appointment/labs w/ PCP. Per Rachel's note today, this is to be scheduled in ~2 months. Pending A1c, consider addition of SGLT2, if eGFR remains >45 for A1c, CKD, and ASCVD benefits. Can also complete application to [place] patient assistance program for [employer].  . Discussed goal BP. [name] will continue to communicate w/ Apolonio Schneiders regarding BP management.  Patient Self Care Activities:   . Patient will take medications as prescribed . Patient will commit to reducing sweet beverage consumption.  Please see past updates related to this goal by clicking on the "Past Updates" button in the selected goal          Plan: - Will continue to collaborate w/ CPhT as above - Scheduled follow up call 02/24/20  Catie Darnelle Maffucci, PharmD, [place] [phone]

## 2019-12-23 NOTE — Patient Instructions (Signed)
Visit Information  Goals Addressed            This Visit's Progress     Patient Stated   . PharmD "I have a lot of medications" (pt-stated)       Current Barriers:  . Polypharmacy; complex patient with multiple comorbidities including hx cerebral aneurysm, cognitive impairment, depression, chronic headaches, CKD, tobacco abuse, hx polysubstance abuse . His sister, [name], providers care and manages his medications. Does note some cost concerns.   o COPD/tobacco abuse: Anoro, though collaboratively decided to apply for [employer] assistance through [employer], as patient would not qualifiy for [place] assistance for Anoro due to their out of pocket spend requirement. BI requires different proof of income than we previously submitted. [name] hasn't had a chance to bring updated proof of income by clinic yet - Breathing has improved since restarting Anoro sample. [name] notes that patient declines considering tobacco cessation at this time. o HTN: amlodipine 10 mg daily, carvedilol 12.5 mg BID, lisinopril 10 mg daily - had visit w/ PCP today to discuss BP. Patient starts suboxone therapy tomorrow, and will evaluate if this impacts BP. If still elevated, will increase lisinopril to 20 mg daily  o Chronic HA: tizanidine 4 mg QAM, 4 mg Qmidday, 12 mg QPM (per HA specialist at [facility]); topiramate 50 mg BID o Anxiety: duloxetine 60 mg BID, hydroxyzine 25 mg TID  o T2DM: glipizide XL 5 mg daily; due for updated A1c. [name] notes he has gained some weight, and sugars are a bit more elevated. Denies hypoglycemia - Hx GI side effects w/ metformin o Lipids: fenofibrate 67 mg daily; ASCVD risk 37%; noted intolerance to atorvastatin 10 mg d/t myalgias   Pharmacist Clinical Goal(s):  Marland Kitchen Over the next 90 days, patient will work with PharmD and provider towards optimized medication management  Interventions: . Discussed need for updated proof of income to apply for [facility] assistance. [name] will bring this  by clinic to have a copy made for me later this week. I will collaborate w/ Susy Frizzle, CPhT to resubmit to [facility] . Discussed need for appointment/labs w/ PCP. Per Rachel's note today, this is to be scheduled in ~2 months. Pending A1c, consider addition of SGLT2, if eGFR remains >45 for A1c, CKD, and ASCVD benefits. Can also complete application to [place] patient assistance program for [employer].  . Discussed goal BP. [name] will continue to communicate w/ Apolonio Schneiders regarding BP management.  Patient Self Care Activities:  . Patient will take medications as prescribed . Patient will commit to reducing sweet beverage consumption.  Please see past updates related to this goal by clicking on the "Past Updates" button in the selected goal         The patient verbalized understanding of instructions provided today and declined a print copy of patient instruction materials.  Plan: - Will continue to collaborate w/ CPhT as above - Scheduled follow up call 02/24/20  Catie Darnelle Maffucci, PharmD, [place] [phone]

## 2019-12-23 NOTE — Progress Notes (Signed)
BP (!) 162/98   Pulse 98   Ht 5\' 7"  (1.702 m)   Wt 252 lb (114.3 kg)   SpO2 98%   BMI 39.47 kg/m    Subjective:    Patient ID: Derek Binet., male    DOB: 10-12-1966, 54 y.o.   MRN: [id number]  HPI: Derek Riehm. is a 54 y.o. male  Chief Complaint  Patient presents with  . Hypertension    . This visit was completed via WebEx due to the restrictions of the COVID-19 pandemic. All issues as above were discussed and addressed. Physical exam was done as above through visual confirmation on WebEx. If it was felt that the patient should be evaluated in the office, they were directed there. The patient verbally consented to this visit. . Location of the patient: home . Location of the provider: work . Those involved with this call:  . Provider: Merrie Roof, PA-C . CMA: Lesle Chris, [place] . Front Desk/Registration: Jill Side  . Time spent on call: 15 minutes with patient face to face via video conference. More than 50% of this time was spent in counseling and coordination of care. 5 minutes total spent in review of patient's record and preparation of their chart. I verified patient identity using two factors (patient name and date of birth). Patient consents verbally to being seen via telemedicine visit today.   Patient presenting today for HTN f/u. Has been taking his medications faithfully without side effects. Home BPs persistently elevated around 150-160/90s range. Denies CP, SOB, dizziness. Does not exercise or follow particular diet. Still under a lot of stress and having to deal with chronic pain which he feels impacts his pressures greatly.   Relevant past medical, surgical, family and social history reviewed and updated as indicated. Interim medical history since our last visit reviewed. Allergies and medications reviewed and updated.  Review of Systems  Per HPI unless specifically indicated above     Objective:    BP (!) 162/98   Pulse 98   Ht 5\' 7"  (1.702  m)   Wt 252 lb (114.3 kg)   SpO2 98%   BMI 39.47 kg/m   Wt Readings from Last 3 Encounters:  12/23/19 252 lb (114.3 kg)  11/18/19 251 lb 12.8 oz (114.2 kg)  11/18/19 251 lb 12.8 oz (114.2 kg)    Physical Exam Vitals and nursing note reviewed.  Constitutional:      General: He is not in acute distress.    Appearance: Normal appearance.  HENT:     Head: Atraumatic.     Right Ear: External ear normal.     Left Ear: External ear normal.     Nose: Nose normal. No congestion.     Mouth/Throat:     Mouth: Mucous membranes are moist.     Pharynx: Oropharynx is clear.  Eyes:     Extraocular Movements: Extraocular movements intact.     Conjunctiva/sclera: Conjunctivae normal.  Pulmonary:     Effort: Pulmonary effort is normal. No respiratory distress.  Musculoskeletal:        General: Normal range of motion.     Cervical back: Normal range of motion.  Skin:    General: Skin is dry.     Findings: No erythema or rash.  Neurological:     Mental Status: He is oriented to person, place, and time.  Psychiatric:        Mood and Affect: Mood normal.  Thought Content: Thought content normal.        Judgment: Judgment normal.     Results for orders placed or performed in visit on 11/19/19  HM DIABETES EYE EXAM  Result Value Ref Range   HM Diabetic Eye Exam No Retinopathy No Retinopathy      Assessment & Plan:   Problem List Items Addressed This Visit      Cardiovascular and Mediastinum   Hypertension associated with diabetes ([place]) - Primary    Will likely need to increase to 20 mg lisinopril but he is to start suboxone tomorrow so sister would like to wait a week or so to see if that brings things down at all first. They will call after a week or so on that regimen to check in about readings and state they have plenty of 10 mg tabs at home to try 20 mg dose if needed without new refill.           Follow up plan: Return in about 2 months (around 02/20/2020) for 6 month  f/u.

## 2019-12-23 NOTE — Assessment & Plan Note (Signed)
Will likely need to increase to 20 mg lisinopril but he is to start suboxone tomorrow so sister would like to wait a week or so to see if that brings things down at all first. They will call after a week or so on that regimen to check in about readings and state they have plenty of 10 mg tabs at home to try 20 mg dose if needed without new refill.

## 2019-12-24 DIAGNOSIS — Z79891 Long term (current) use of opiate analgesic: Secondary | ICD-10-CM | POA: Diagnosis not present

## 2019-12-24 DIAGNOSIS — Z79899 Other long term (current) drug therapy: Secondary | ICD-10-CM | POA: Diagnosis not present

## 2019-12-24 DIAGNOSIS — Z87898 Personal history of other specified conditions: Secondary | ICD-10-CM | POA: Diagnosis not present

## 2019-12-24 DIAGNOSIS — G894 Chronic pain syndrome: Secondary | ICD-10-CM | POA: Diagnosis not present

## 2019-12-30 ENCOUNTER — Encounter: Payer: Self-pay | Admitting: Emergency Medicine

## 2019-12-30 ENCOUNTER — Emergency Department: Payer: Medicare Other

## 2019-12-30 ENCOUNTER — Other Ambulatory Visit: Payer: Self-pay

## 2019-12-30 ENCOUNTER — Emergency Department
Admission: EM | Admit: 2019-12-30 | Discharge: 2019-12-30 | Disposition: A | Discharge status: Home / Self Care | Payer: Medicare Other | Attending: Emergency Medicine | Admitting: Emergency Medicine

## 2019-12-30 DIAGNOSIS — Z881 Allergy status to other antibiotic agents status: Secondary | ICD-10-CM | POA: Diagnosis not present

## 2019-12-30 DIAGNOSIS — R0602 Shortness of breath: Secondary | ICD-10-CM | POA: Diagnosis not present

## 2019-12-30 DIAGNOSIS — F1490 Cocaine use, unspecified, uncomplicated: Secondary | ICD-10-CM

## 2019-12-30 DIAGNOSIS — G4730 Sleep apnea, unspecified: Secondary | ICD-10-CM | POA: Insufficient documentation

## 2019-12-30 DIAGNOSIS — J45909 Unspecified asthma, uncomplicated: Secondary | ICD-10-CM | POA: Insufficient documentation

## 2019-12-30 DIAGNOSIS — R4182 Altered mental status, unspecified: Secondary | ICD-10-CM

## 2019-12-30 DIAGNOSIS — Z885 Allergy status to narcotic agent status: Secondary | ICD-10-CM | POA: Diagnosis not present

## 2019-12-30 DIAGNOSIS — Z9889 Other specified postprocedural states: Secondary | ICD-10-CM | POA: Diagnosis not present

## 2019-12-30 DIAGNOSIS — R21 Rash and other nonspecific skin eruption: Secondary | ICD-10-CM | POA: Diagnosis not present

## 2019-12-30 DIAGNOSIS — Z8679 Personal history of other diseases of the circulatory system: Secondary | ICD-10-CM | POA: Diagnosis not present

## 2019-12-30 DIAGNOSIS — F17210 Nicotine dependence, cigarettes, uncomplicated: Secondary | ICD-10-CM | POA: Diagnosis not present

## 2019-12-30 DIAGNOSIS — N182 Chronic kidney disease, stage 2 (mild): Secondary | ICD-10-CM | POA: Insufficient documentation

## 2019-12-30 DIAGNOSIS — N141 Nephropathy induced by other drugs, medicaments and biological substances: Secondary | ICD-10-CM | POA: Insufficient documentation

## 2019-12-30 DIAGNOSIS — N179 Acute kidney failure, unspecified: Secondary | ICD-10-CM | POA: Diagnosis not present

## 2019-12-30 DIAGNOSIS — T402X5A Adverse effect of other opioids, initial encounter: Secondary | ICD-10-CM | POA: Diagnosis not present

## 2019-12-30 DIAGNOSIS — E1122 Type 2 diabetes mellitus with diabetic chronic kidney disease: Secondary | ICD-10-CM | POA: Diagnosis not present

## 2019-12-30 DIAGNOSIS — J449 Chronic obstructive pulmonary disease, unspecified: Secondary | ICD-10-CM | POA: Diagnosis not present

## 2019-12-30 DIAGNOSIS — E860 Dehydration: Secondary | ICD-10-CM | POA: Diagnosis not present

## 2019-12-30 DIAGNOSIS — I129 Hypertensive chronic kidney disease with stage 1 through stage 4 chronic kidney disease, or unspecified chronic kidney disease: Secondary | ICD-10-CM | POA: Insufficient documentation

## 2019-12-30 DIAGNOSIS — Z20822 Contact with and (suspected) exposure to covid-19: Secondary | ICD-10-CM | POA: Insufficient documentation

## 2019-12-30 DIAGNOSIS — G9340 Encephalopathy, unspecified: Secondary | ICD-10-CM | POA: Diagnosis not present

## 2019-12-30 LAB — BASIC METABOLIC PANEL
Anion gap: 7 (ref 5–15)
BUN: 40 mg/dL — ABNORMAL HIGH (ref 6–20)
CO2: 25 mmol/L (ref 22–32)
Calcium: 8.3 mg/dL — ABNORMAL LOW (ref 8.9–10.3)
Chloride: 107 mmol/L (ref 98–111)
Creatinine, Ser: 2.27 mg/dL — ABNORMAL HIGH (ref 0.61–1.24)
GFR calc Af Amer: 37 mL/min — ABNORMAL LOW (ref >60)
GFR calc non Af Amer: 32 mL/min — ABNORMAL LOW (ref >60)
Glucose, Bld: 94 mg/dL (ref 70–99)
Potassium: 3.7 mmol/L (ref 3.5–5.1)
Sodium: 139 mmol/L (ref 135–145)

## 2019-12-30 LAB — URINE DRUG SCREEN, QUALITATIVE (ARMC ONLY)
Amphetamines, Ur Screen: NOT DETECTED
Barbiturates, Ur Screen: NOT DETECTED
Benzodiazepine, Ur Scrn: NOT DETECTED
Cannabinoid 50 Ng, Ur ~~LOC~~: NOT DETECTED
Cocaine Metabolite,Ur ~~LOC~~: POSITIVE — AB
MDMA (Ecstasy)Ur Screen: NOT DETECTED
Methadone Scn, Ur: NOT DETECTED
Opiate, Ur Screen: NOT DETECTED
Phencyclidine (PCP) Ur S: NOT DETECTED
Tricyclic, Ur Screen: NOT DETECTED

## 2019-12-30 LAB — COMPREHENSIVE METABOLIC PANEL
ALT: 21 U/L (ref 0–44)
AST: 31 U/L (ref 15–41)
Albumin: 3.8 g/dL (ref 3.5–5.0)
Alkaline Phosphatase: 74 U/L (ref 38–126)
Anion gap: 10 (ref 5–15)
BUN: 45 mg/dL — ABNORMAL HIGH (ref 6–20)
CO2: 23 mmol/L (ref 22–32)
Calcium: 8.6 mg/dL — ABNORMAL LOW (ref 8.9–10.3)
Chloride: 105 mmol/L (ref 98–111)
Creatinine, Ser: 2.56 mg/dL — ABNORMAL HIGH (ref 0.61–1.24)
GFR calc Af Amer: 32 mL/min — ABNORMAL LOW (ref >60)
GFR calc non Af Amer: 27 mL/min — ABNORMAL LOW (ref >60)
Glucose, Bld: 202 mg/dL — ABNORMAL HIGH (ref 70–99)
Potassium: 3.8 mmol/L (ref 3.5–5.1)
Sodium: 138 mmol/L (ref 135–145)
Total Bilirubin: 0.9 mg/dL (ref 0.3–1.2)
Total Protein: 6.8 g/dL (ref 6.5–8.1)

## 2019-12-30 LAB — BLOOD GAS, VENOUS
Acid-Base Excess: 0.1 mmol/L (ref 0.0–2.0)
Bicarbonate: 27.9 mmol/L (ref 20.0–28.0)
FIO2: 0.28
O2 Saturation: 48.7 %
Patient temperature: 37
pCO2, Ven: 58 mmHg (ref 44.0–60.0)
pH, Ven: 7.29 (ref 7.250–7.430)
pO2, Ven: <31 mmHg — CL (ref 32.0–45.0)

## 2019-12-30 LAB — CBC
HCT: 38.3 % — ABNORMAL LOW (ref 39.0–52.0)
Hemoglobin: 12.9 g/dL — ABNORMAL LOW (ref 13.0–17.0)
MCH: 30.3 pg (ref 26.0–34.0)
MCHC: 33.7 g/dL (ref 30.0–36.0)
MCV: 89.9 fL (ref 80.0–100.0)
Platelets: 225 10*3/uL (ref 150–400)
RBC: 4.26 MIL/uL (ref 4.22–5.81)
RDW: 12.5 % (ref 11.5–15.5)
WBC: 8.2 10*3/uL (ref 4.0–10.5)
nRBC: 0 % (ref 0.0–0.2)

## 2019-12-30 LAB — ETHANOL: Alcohol, Ethyl (B): <10 mg/dL (ref <10)

## 2019-12-30 LAB — POC SARS CORONAVIRUS 2 AG: SARS Coronavirus 2 Ag: NEGATIVE

## 2019-12-30 IMAGING — CT CT HEAD W/O CM
3 series · 16 of 47 positions shown, 19 images · non-contrast
Comparison: None.

CLINICAL DATA: Encephalopathy

EXAM:
CT HEAD WITHOUT CONTRAST
TECHNIQUE: Contiguous axial images were obtained from the base of the skull
through the vertex without intravenous contrast.

[Series 3: head (person_name) (person_name) · axial · 0.42mm/px · z∈[-78,+47]mm · 10 of 30 slices shown, 13 images]
[im 3/30  brain]
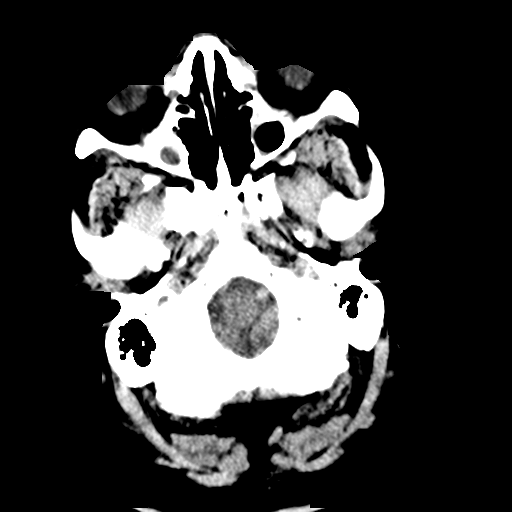
[im 3/30  bone]
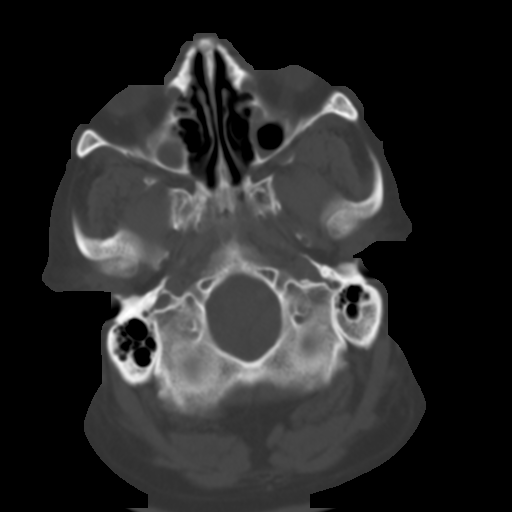
[im 6/30  brain]
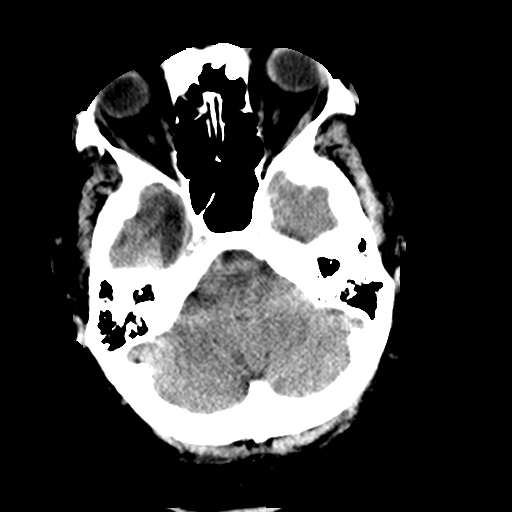
[im 9/30  brain]
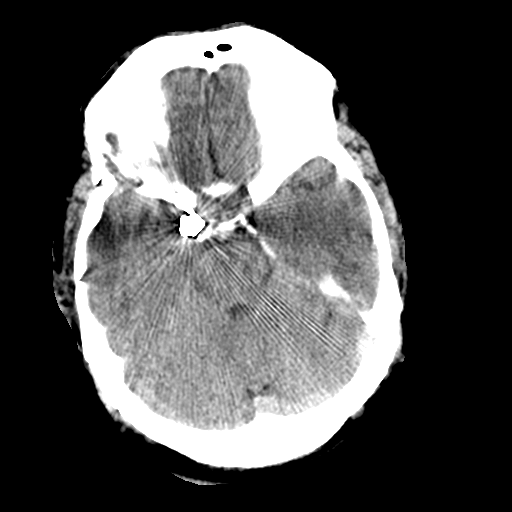
[im 11/30  brain]
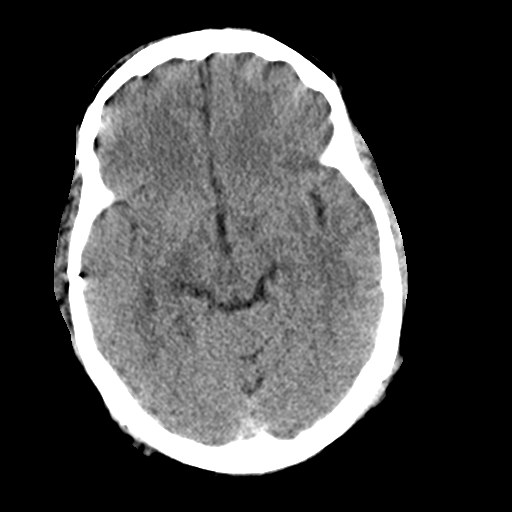
[im 14/30  brain]
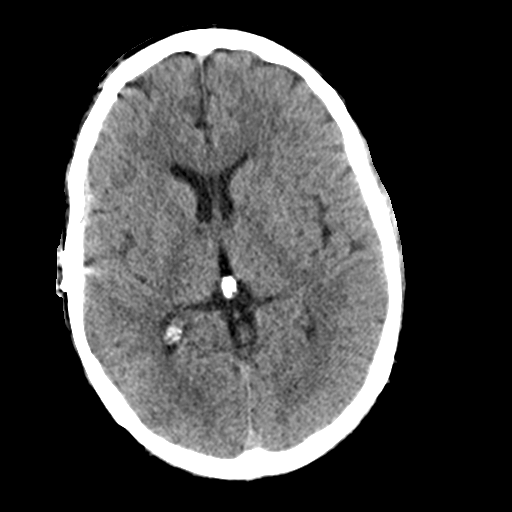
[im 14/30  bone]
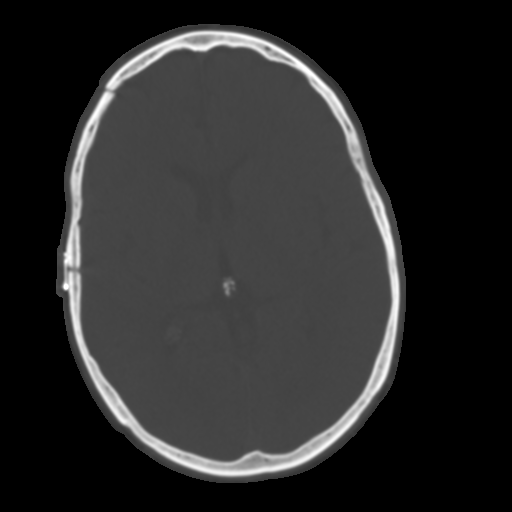
[im 17/30  brain]
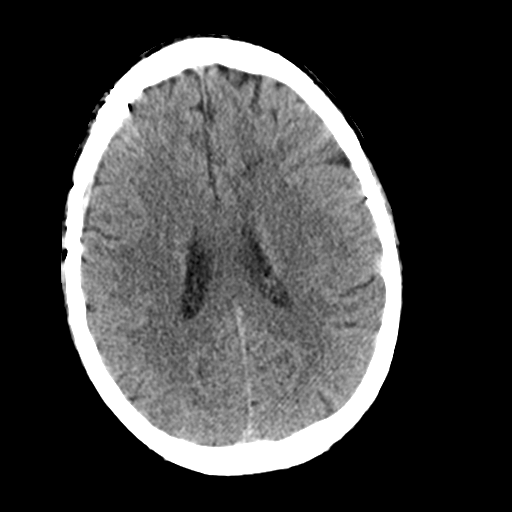
[im 20/30  brain]
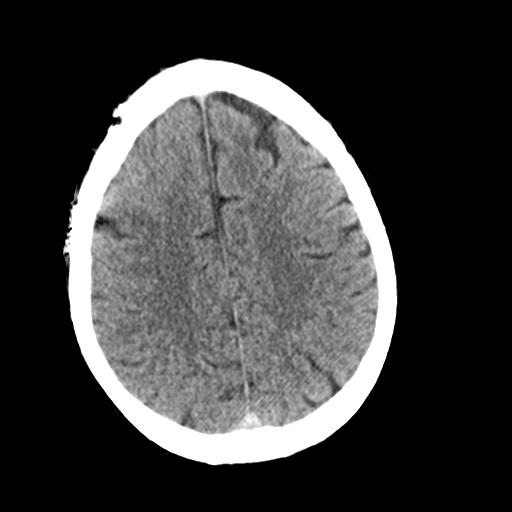
[im 23/30  brain]
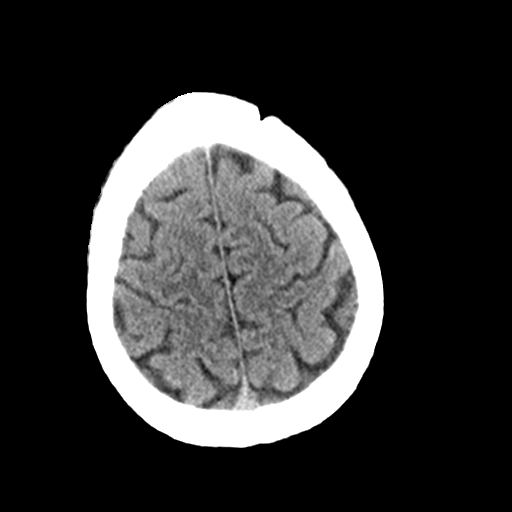
[im 25/30  brain]
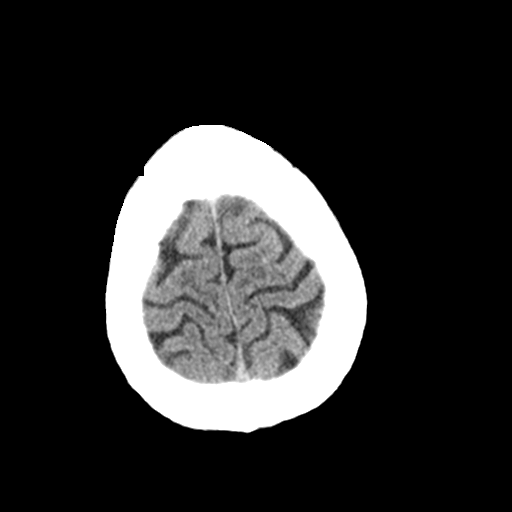
[im 25/30  bone]
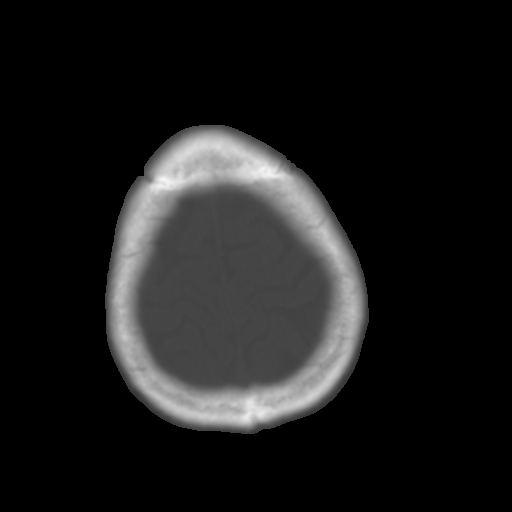
[im 28/30  brain]
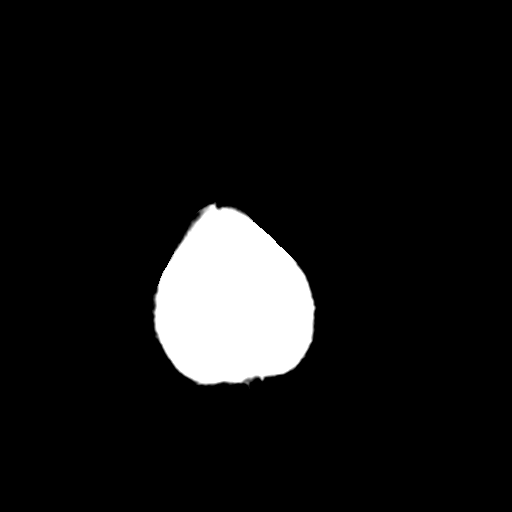

[Series 4: sagittal soft tissue · sagittal · 0.33mm/px · 3 of 55 slices shown]
[im 19/55  brain]
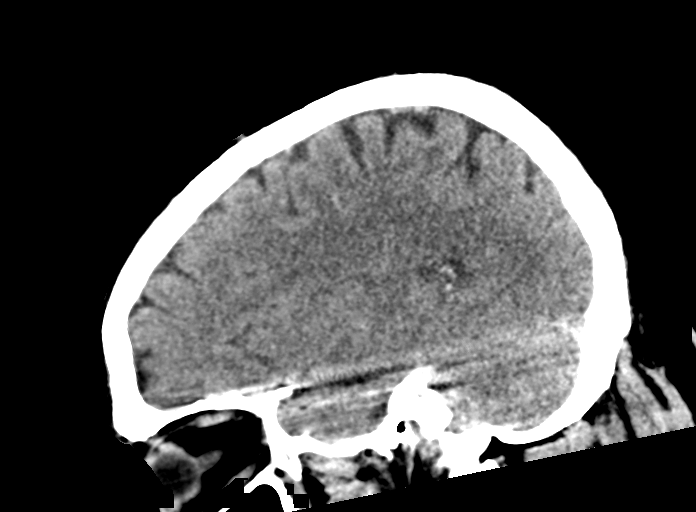
[im 28/55  brain]
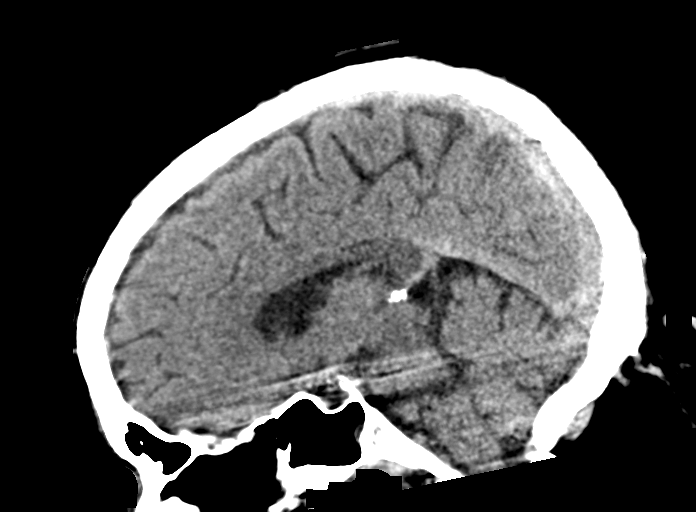
[im 37/55  brain]
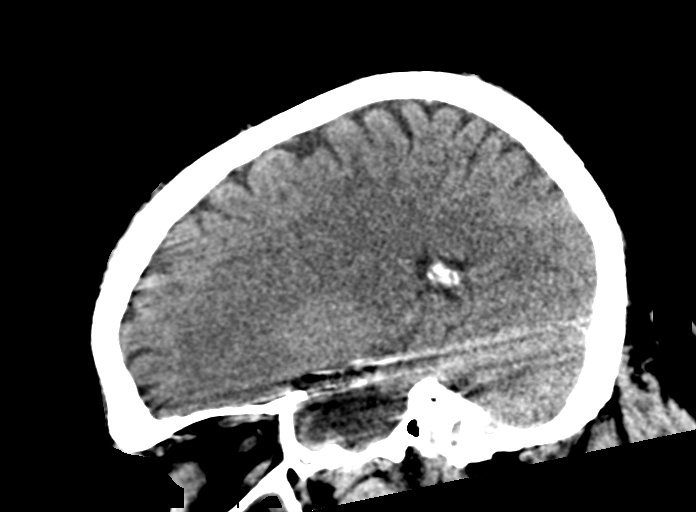

[Series 5: coronal soft tissue · coronal · 0.36mm/px · 3 of 68 slices shown]
[im 23/68  brain]
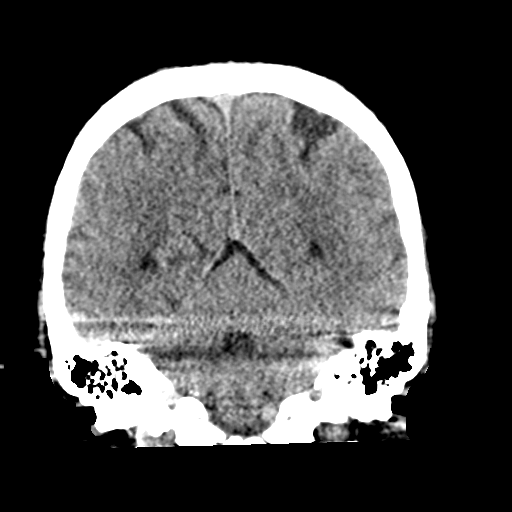
[im 30/68  brain]
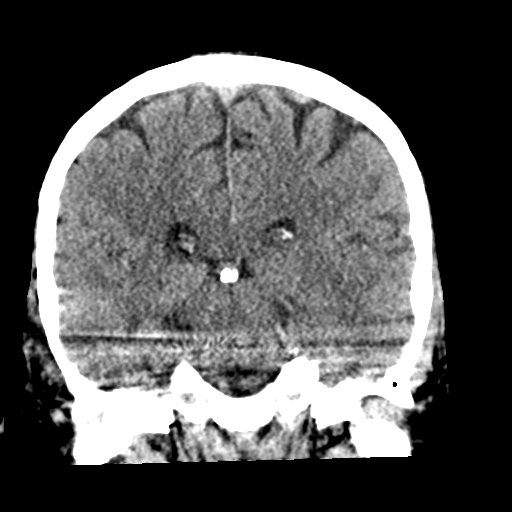
[im 38/68  brain]
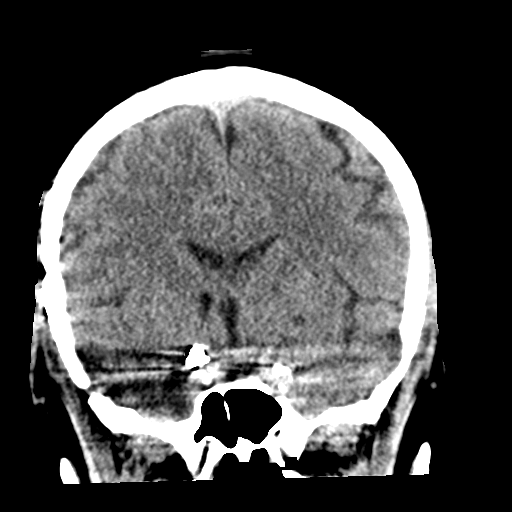

[16 of 47 positions shown; findings below may reference images not displayed]

FINDINGS: Brain: No evidence of acute infarction, hemorrhage, hydrocephalus,
extra-axial collection or mass lesion/mass effect. There is
encephalomalacia involving the anterior right temporal lobe.

Vascular: The patient appears to be status post prior aneurysmal
clipping and embolization on the right.

Skull: The patient is status post prior frontotemporal craniotomy.

Sinuses/Orbits: There is chronic appearing mucosal thickening of the
bilateral maxillary sinuses. The mastoid air cells and remaining
paranasal sinuses are essentially clear.

Other: None.
IMPRESSION: 1. No acute intracranial abnormality.
2. Chronic findings as detailed above.

## 2019-12-30 IMAGING — CR DG CHEST 2V
2 series · 2 of 2 positions shown · non-contrast
Comparison: [DATE]

CLINICAL DATA: Shortness of breath

EXAM:
CHEST - 2 VIEW

[chest pa]
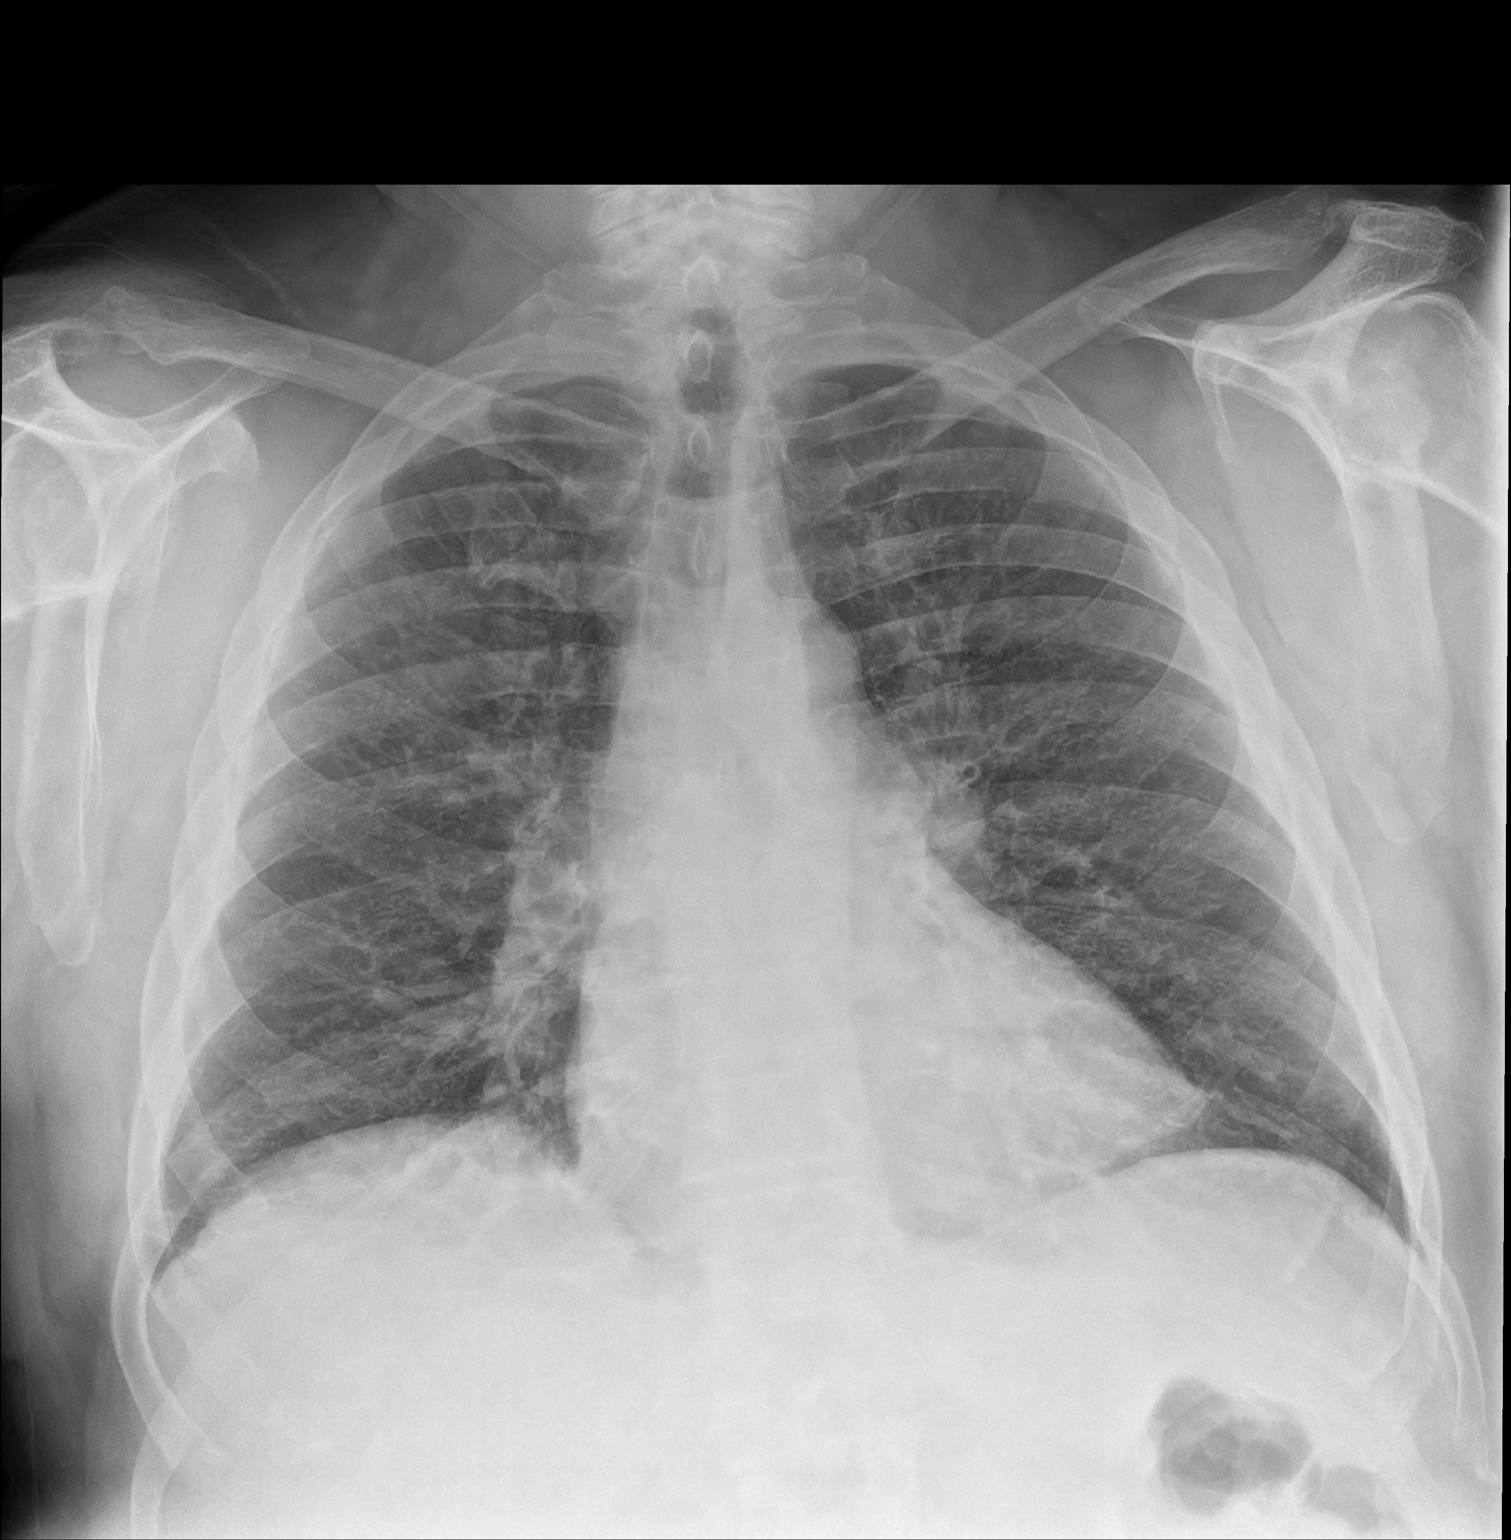

[chest lat]
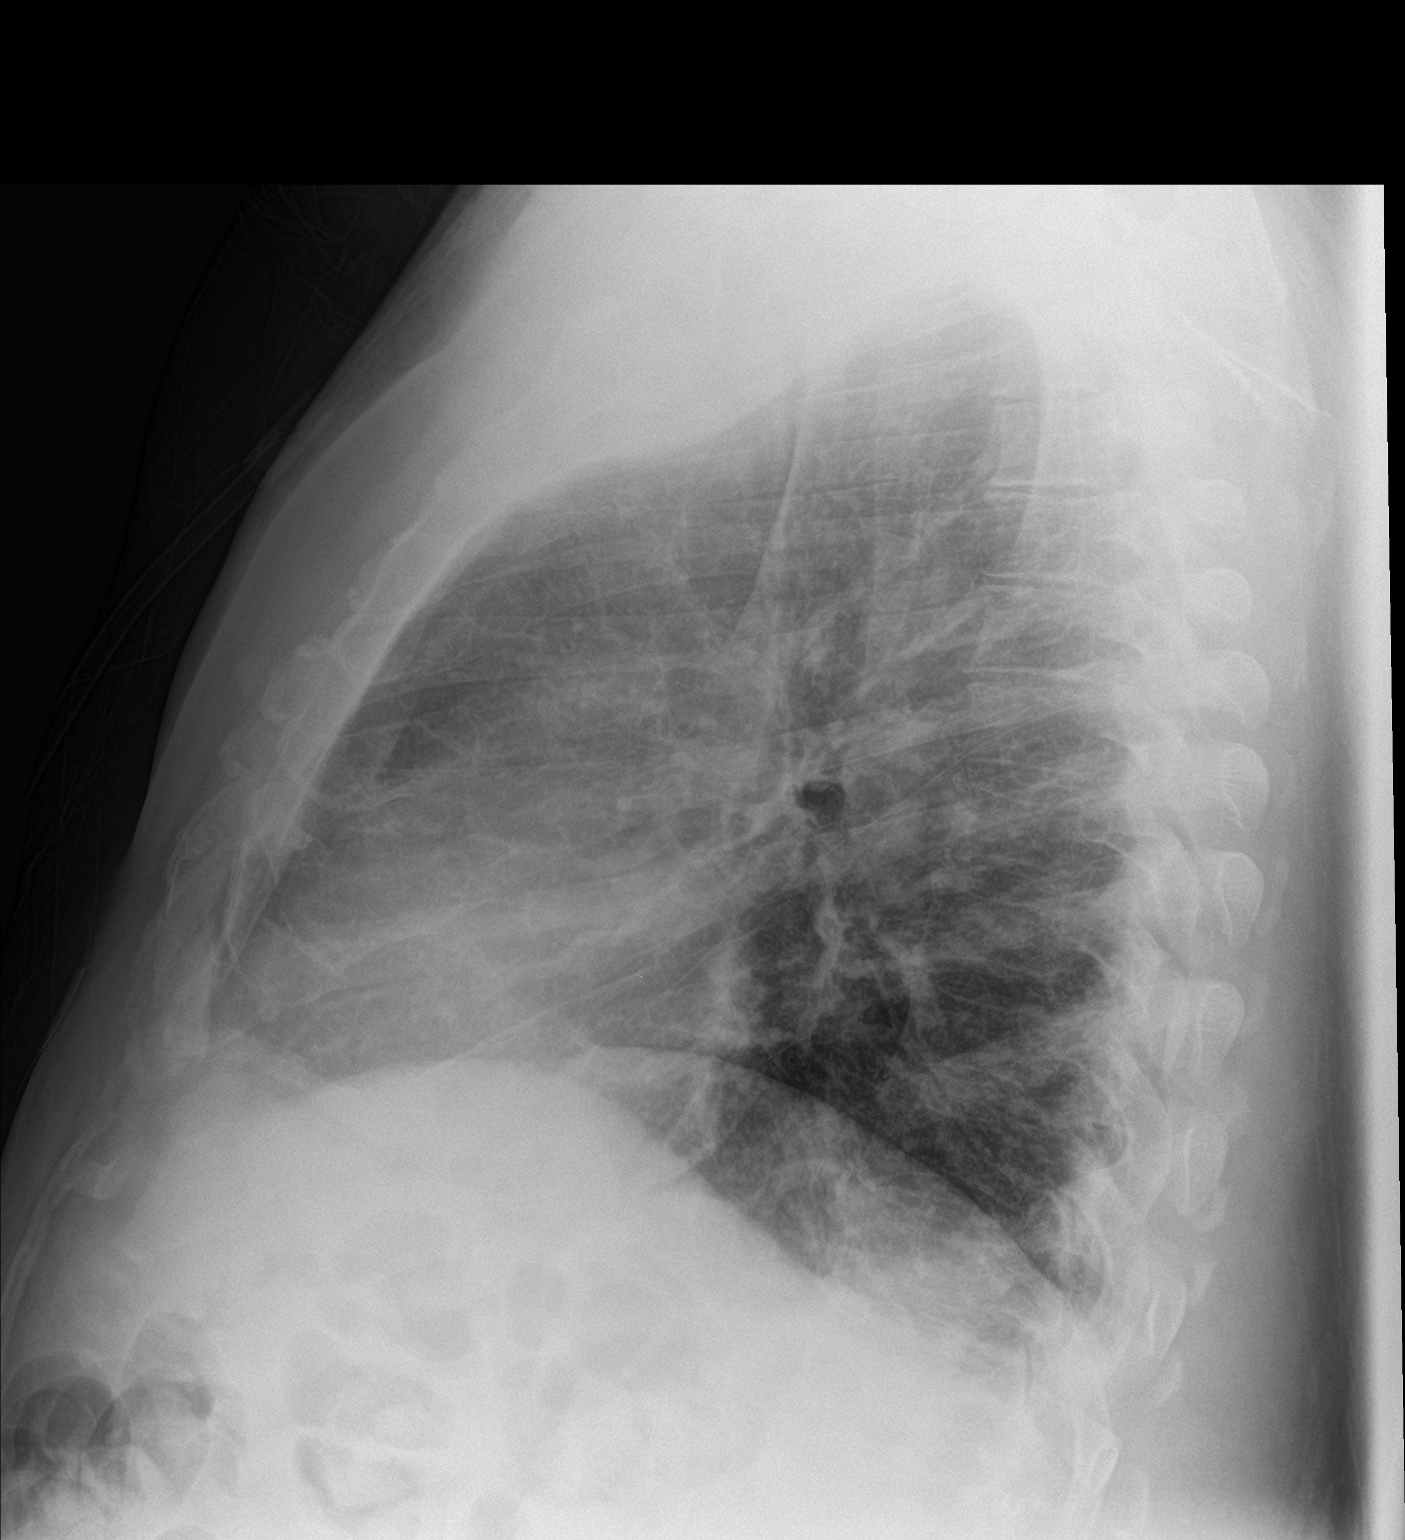

[2 of 2 positions shown; findings below may reference images not displayed]

FINDINGS: Cardiac shadows within normal limits. Mild vascular congestion is
noted without interstitial edema. No focal infiltrate or sizable
effusion is seen. No bony abnormality is noted.
IMPRESSION: Mild vascular congestion without interstitial edema.

## 2019-12-30 MED ORDER — SODIUM CHLORIDE 0.9 % IV SOLN: Freq: Once | INTRAVENOUS | Status: AC

## 2019-12-30 MED ORDER — NALOXONE HCL 2 MG/2ML IJ SOSY
0.4000 mg | PREFILLED_SYRINGE | Freq: Once | INTRAMUSCULAR | Status: AC
  Administered 2019-12-30: 18:00:00 0.4 mg via INTRAVENOUS
  Filled 2019-12-30: qty 2

## 2019-12-30 MED ORDER — NALOXONE HCL 0.4 MG/ML IJ SOLN: 0.2000 mg | Freq: Once | INTRAMUSCULAR | Status: DC

## 2019-12-30 NOTE — ED Notes (Signed)
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  Sister requests to be contacted with any updates; name and number can be found in chart.

## 2019-12-30 NOTE — ED Provider Notes (Signed)
[facility] Emergency Department Provider Note       Time seen: ----------------------------------------- 5:11 PM on 12/30/2019 -----------------------------------------   I have reviewed the triage vital signs and the nursing notes.  HISTORY   Chief Complaint Altered Mental Status    HPI Derek Cooley. is a 54 y.o. male with a history of pancreatitis, anxiety, asthma, depression, diabetes, GERD, hypertension, perforated bowel who presents to the ED for acute confusion since taking Suboxone the day before yesterday.  Sister states he has been very lethargic since that time he has been had a rash and has been itching.  Patient was somnolent on arrival.  Past Medical History:  Diagnosis Date  . Abdominal abscess   . Acute pancreatitis   . Anxiety   . Asthma   . Back pain   . Depression   . Diabetes mellitus without complication ([place])   . Emphysema of lung ([place])    right sided  . GERD (gastroesophageal reflux disease)   . Hemorrhage into subarachnoid space of neuraxis ([place]) 01/26/2010  . Hypertension   . Mild cognitive impairment   . MRSA (methicillin resistant Staphylococcus aureus)   . Obesity   . Panic disorder   . Perforated bowel ([place]) 2007   Tempoary Colostomy Bag, Skin Graft for Abd wound  . Sleep apnea    sleep study 2013  . Subarachnoid hemorrhage ([place]) 2011   coil placed  . Type 2 diabetes mellitus without complication, without long-term current use of insulin ([place]) 04/17/2016  . Vitreous hemorrhage ([place]) 03/27/2011   Overview:  Bilateral; 01/2010, from [facility]     Patient Active Problem List   Diagnosis Date Noted  . Hypertriglyceridemia 07/06/2019  . Statin intolerance 01/02/2019  . Acute bronchitis due to other specified organisms 12/01/2018  . Hx of aneurysm 10/16/2017  . Controlled type 2 diabetes mellitus without complication, without long-term current use of insulin ([place]) 04/17/2016  . COPD (chronic obstructive pulmonary  disease) ([place]) 04/17/2016  . GERD (gastroesophageal reflux disease) 08/25/2015  . High blood cholesterol 05/16/2015  . Depression 05/16/2015  . Chronic anxiety 05/16/2015  . Benign hypertensive renal disease 05/16/2015  . Persistent headaches 05/16/2015  . CKD (chronic kidney disease) stage 2, GFR 60-89 ml/min 05/16/2015  . Tobacco abuse 05/16/2015  . Abdominal wall abscess 02/01/2014  . Infection and inflammatory reaction due to other internal prosthetic devices, implants and grafts, initial encounter ([place]) 01/05/2014  . Chronic abdominal pain 06/23/2013  . 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/2013  . Obstructive apnea 06/23/2013  . Mild cognitive disorder 03/27/2011  . Hypertension associated with diabetes ([place]) 03/06/2011  . Adiposity 03/06/2011    Past Surgical History:  Procedure Laterality Date  . COLON SURGERY  2007   colostomy bag placed s/p perforated bowel  . KNEE SURGERY Right   . Perforated bowel      Allergies Atorvastatin, Avelox [moxifloxacin hcl in nacl], Dilaudid [hydromorphone hcl], Fluoxetine, Levofloxacin, Morphine and related, Other, and Vancomycin  Social History Social History   Tobacco Use  . Smoking status: Current Every Day Smoker    Packs/day: 1.50    Types: Cigarettes    Start date: 10/11/1984  . Smokeless tobacco: Never Used  Substance Use Topics  . Alcohol use: Yes    Alcohol/week: 0.0 standard drinks    Comment: occasionall   . Drug use: No    Review of Systems Constitutional: Negative for fever. Cardiovascular: Negative for chest pain. Respiratory: Negative for shortness of breath. Gastrointestinal: Negative  for abdominal pain, vomiting and diarrhea. Musculoskeletal: Negative for back pain. Skin: Negative for rash. Neurological: Positive for weakness and lethargy, confusion  All systems negative/normal/unremarkable except as stated in the HPI  ____________________________________________   PHYSICAL EXAM:  VITAL  SIGNS: ED Triage Vitals [12/30/19 1341]  Enc Vitals Group     BP 111/70     Pulse Rate 61     Resp (!) 22     Temp 99.5 F (37.5 C)     Temp Source Oral     SpO2 (!) 88 %     Weight      Height      Head Circumference      Peak Flow      Pain Score      Pain Loc      Pain Edu?      Excl. in [place]?     Constitutional: Alert and oriented.  Lethargic appearing and falls asleep easily Eyes: Conjunctivae are normal. Normal extraocular movements. ENT      Head: Previous craniotomy incision is noted      Nose: No congestion/rhinnorhea.      Mouth/Throat: Mucous membranes are moist.      Neck: No stridor. Cardiovascular: Normal rate, regular rhythm. No murmurs, rubs, or gallops. Respiratory: Normal respiratory effort without tachypnea nor retractions. Breath sounds are clear and equal bilaterally. No wheezes/rales/rhonchi. Gastrointestinal: Soft and nontender. Normal bowel sounds.  Previous laparotomy incision with likely previous wound dehiscence Musculoskeletal: Nontender with normal range of motion in extremities. No lower extremity tenderness nor edema. Neurologic:  Normal speech and language. No gross focal neurologic deficits are appreciated.  Generalized weakness, nothing focal Skin:  Skin is warm, dry and intact. No rash noted. Psychiatric: Mood and affect are normal.  ____________________________________________  EKG: Interpreted by me.  Sinus rhythm with rate of 63 bpm, possible septal infarct age-indeterminate, normal axis, normal QT  ____________________________________________  ED COURSE:  As part of my medical decision making, I reviewed the following data within the [place] History obtained from family if available, nursing notes, old chart and ekg, as well as notes from prior ED visits. Patient presented for altered mental status, we will assess with labs and imaging as indicated at this time. Clinical Course as of Dec 30 2031  Wed Dec 30, 2019   2014 Cocaine Metabolite,Ur [place](!): POSITIVE [JW]    Clinical Course User Index [JW] Earleen Newport, MD   Procedures  Derek Binet. was evaluated in Emergency Department on 12/30/2019 for the symptoms described in the history of present illness. He was evaluated in the context of the global COVID-19 pandemic, which necessitated consideration that the patient might be at risk for infection with the SARS-CoV-2 virus that causes COVID-19. Institutional protocols and algorithms that pertain to the evaluation of patients at risk for COVID-19 are in a state of rapid change based on information released by regulatory bodies including the CDC and federal and state organizations. These policies and algorithms were followed during the patient's care in the ED.  ____________________________________________   LABS (pertinent positives/negatives)  Labs Reviewed  COMPREHENSIVE METABOLIC PANEL - Abnormal; Notable for the following components:      Result Value   Glucose, Bld 202 (*)    BUN 45 (*)    Creatinine, Ser 2.56 (*)    Calcium 8.6 (*)    GFR calc non Af Amer 27 (*)    GFR calc Af Amer 32 (*)    All  other components within normal limits  CBC - Abnormal; Notable for the following components:   Hemoglobin 12.9 (*)    HCT 38.3 (*)    All other components within normal limits  BLOOD GAS, VENOUS - Abnormal; Notable for the following components:   pO2, Ven <31.0 (*)    All other components within normal limits  ETHANOL  URINE DRUG SCREEN, QUALITATIVE ([facility] ONLY)  BASIC METABOLIC PANEL  POC SARS CORONAVIRUS 2 AG -  ED  POC SARS CORONAVIRUS 2 AG    RADIOLOGY Images were viewed by me Chest x-ray IMPRESSION: Mild vascular congestion without interstitial edema. CT head IMPRESSION:  1. No acute intracranial abnormality.  2. Chronic findings as detailed above.  ____________________________________________   DIFFERENTIAL DIAGNOSIS   Dehydration, electrolyte abnormality,  medication side effect, overdose, intoxication  FINAL ASSESSMENT AND PLAN  Acute kidney injury, medication side effect   Plan: The patient had presented for confusion. Patient's labs did indicate acute kidney injury with a significant increase in his creatinine. Patient's imaging revealed mild vascular congestion without edema.  He was given IV fluids and a small dose of Narcan.  Patient ambulated well, got up and use the bathroom.  We rechecked his basic metabolic panel which was showing improvement indicating that he was dehydrated.  He was also positive for cocaine which may have contributed to his altered mental status.  He appears to be back to his baseline and will have outpatient follow-up.   Laurence Aly, MD    Note: This note was generated in part or whole with voice recognition software. Voice recognition is usually quite accurate but there are transcription errors that can and very often do occur. I apologize for any typographical errors that were not detected and corrected.     Earleen Newport, MD 12/30/19 2034

## 2019-12-30 NOTE — ED Triage Notes (Signed)
Patient presents to the ED with acute confusion since taking suboxone the day before yesterday.  Patient's sister states patient has been very lethargic since that time and he has also had a rash and has been itching.  Patient appears to be falling asleep off and on during triage.  Sister states the pain clinic started patient on suboxone for pain relief.  Patient has history of brain surgery and brain aneurysm.

## 2020-01-04 DIAGNOSIS — M1711 Unilateral primary osteoarthritis, right knee: Secondary | ICD-10-CM | POA: Diagnosis not present

## 2020-01-04 DIAGNOSIS — M19012 Primary osteoarthritis, left shoulder: Secondary | ICD-10-CM | POA: Diagnosis not present

## 2020-01-05 ENCOUNTER — Other Ambulatory Visit: Payer: Self-pay | Admitting: Family Medicine

## 2020-01-06 ENCOUNTER — Other Ambulatory Visit: Payer: Self-pay | Admitting: Family Medicine

## 2020-01-13 DIAGNOSIS — Z79891 Long term (current) use of opiate analgesic: Secondary | ICD-10-CM | POA: Diagnosis not present

## 2020-01-13 DIAGNOSIS — Z87898 Personal history of other specified conditions: Secondary | ICD-10-CM | POA: Diagnosis not present

## 2020-01-13 DIAGNOSIS — Z79899 Other long term (current) drug therapy: Secondary | ICD-10-CM | POA: Diagnosis not present

## 2020-01-19 ENCOUNTER — Encounter: Payer: Self-pay | Admitting: Family Medicine

## 2020-01-19 ENCOUNTER — Other Ambulatory Visit: Payer: Self-pay

## 2020-01-19 ENCOUNTER — Telehealth (INDEPENDENT_AMBULATORY_CARE_PROVIDER_SITE_OTHER): Payer: Medicare Other | Admitting: Family Medicine

## 2020-01-19 VITALS — BP 148/97 | HR 66 | Wt 246.4 lb

## 2020-01-19 DIAGNOSIS — M545 Low back pain, unspecified: Secondary | ICD-10-CM

## 2020-01-19 DIAGNOSIS — K1379 Other lesions of oral mucosa: Secondary | ICD-10-CM | POA: Diagnosis not present

## 2020-01-19 MED ORDER — MAGIC MOUTHWASH W/LIDOCAINE: 5.0000 mL | Freq: Three times a day (TID) | ORAL | 0 refills | Status: DC | PRN

## 2020-01-19 NOTE — Progress Notes (Signed)
BP (!) 148/97   Pulse 66   Wt 246 lb 6 oz (111.8 kg)   BMI 38.59 kg/m    Subjective:    Patient ID: Derek Binet., male    DOB: 1966-09-28, 54 y.o.   MRN: [id number]  HPI: Derek Essler. is a 54 y.o. male  Chief Complaint  Patient presents with  . Pain    mouth sore x about 6 days. pt's sister states thay think the Buprenorphine is causing these side effects    . This visit was completed via WebEx due to the restrictions of the COVID-19 pandemic. All issues as above were discussed and addressed. Physical exam was done as above through visual confirmation on WebEx. If it was felt that the patient should be evaluated in the office, they were directed there. The patient verbally consented to this visit. . Location of the patient: home . Location of the provider: home . Those involved with this call:  . Provider: Merrie Roof, PA-C . CMA: Lesle Chris, [place] . Front Desk/Registration: Jill Side  . Time spent on call: 15 minutes with patient face to face via video conference. More than 50% of this time was spent in counseling and coordination of care. 5 minutes total spent in review of patient's record and preparation of their chart. I verified patient identity using two factors (patient name and date of birth). Patient consents verbally to being seen via telemedicine visit today.   Since getting on suboxone with the Pain Clinic was having confusion and itching that was intolerable so went last week and got off that and was placed buprenorphine. This also seemed to come with altered mental status, sedation, and now having a red painful rash in mouth. Called Pain Management yesterday about these issues and was told to stop the medication as he may be allergic. Seems some better today but mouth still hurting. Denies any other changes in diet, medications, etc.   Also notes some acute on chronic low back pain, which he's taking muscle relaxers and ibuprofen prn but with mental  status changes in combination he would like his urine checked to r/o UTI. Denies fever, chills, hematuria, dysuria, new abdominal pain (beyond his chronic post-op abdominal pain).   Relevant past medical, surgical, family and social history reviewed and updated as indicated. Interim medical history since our last visit reviewed. Allergies and medications reviewed and updated.  Review of Systems  Per HPI unless specifically indicated above     Objective:    BP (!) 148/97   Pulse 66   Wt 246 lb 6 oz (111.8 kg)   BMI 38.59 kg/m   Wt Readings from Last 3 Encounters:  01/19/20 246 lb 6 oz (111.8 kg)  12/23/19 252 lb (114.3 kg)  11/18/19 251 lb 12.8 oz (114.2 kg)    Physical Exam Vitals and nursing note reviewed.  Constitutional:      General: He is not in acute distress.    Appearance: Normal appearance.  HENT:     Head: Atraumatic.     Right Ear: External ear normal.     Left Ear: External ear normal.     Nose: Nose normal. No congestion.     Mouth/Throat:     Mouth: Mucous membranes are moist.     Pharynx: Posterior oropharyngeal erythema present. No oropharyngeal exudate.  Eyes:     Extraocular Movements: Extraocular movements intact.     Conjunctiva/sclera: Conjunctivae normal.  Pulmonary:     Effort:  Pulmonary effort is normal. No respiratory distress.  Musculoskeletal:        General: Normal range of motion.     Cervical back: Normal range of motion.  Skin:    General: Skin is dry.     Findings: No erythema or rash.  Neurological:     Mental Status: Mental status is at baseline.  Psychiatric:        Mood and Affect: Mood normal.     Results for orders placed or performed during the hospital encounter of 12/30/19  Comprehensive metabolic panel  Result Value Ref Range   Sodium 138 135 - 145 mmol/L   Potassium 3.8 3.5 - 5.1 mmol/L   Chloride 105 98 - 111 mmol/L   CO2 23 22 - 32 mmol/L   Glucose, Bld 202 (H) 70 - 99 mg/dL   BUN 45 (H) 6 - 20 mg/dL    Creatinine, Ser 2.56 (H) 0.61 - 1.24 mg/dL   Calcium 8.6 (L) 8.9 - 10.3 mg/dL   Total Protein 6.8 6.5 - 8.1 g/dL   Albumin 3.8 3.5 - 5.0 g/dL   AST 31 15 - 41 U/L   ALT 21 0 - 44 U/L   Alkaline Phosphatase 74 38 - 126 U/L   Total Bilirubin 0.9 0.3 - 1.2 mg/dL   GFR calc non Af Amer 27 (L) >60 mL/min   GFR calc Af Amer 32 (L) >60 mL/min   Anion gap 10 5 - 15  CBC  Result Value Ref Range   WBC 8.2 4.0 - 10.5 K/uL   RBC 4.26 4.22 - 5.81 MIL/uL   Hemoglobin 12.9 (L) 13.0 - 17.0 g/dL   HCT 38.3 (L) 39.0 - 52.0 %   MCV 89.9 80.0 - 100.0 fL   MCH 30.3 26.0 - 34.0 pg   MCHC 33.7 30.0 - 36.0 g/dL   RDW 12.5 11.5 - 15.5 %   Platelets 225 150 - 400 K/uL   nRBC 0.0 0.0 - 0.2 %  Blood gas, venous  Result Value Ref Range   FIO2 0.28    Delivery systems NASAL CANNULA    pH, Ven 7.29 7.250 - 7.430   pCO2, Ven 58 44.0 - 60.0 mmHg   pO2, Ven <31.0 (LL) 32.0 - 45.0 mmHg   Bicarbonate 27.9 20.0 - 28.0 mmol/L   Acid-Base Excess 0.1 0.0 - 2.0 mmol/L   O2 Saturation 48.7 %   Patient temperature 37.0    Collection site VENOUS    Sample type VENOUS   Ethanol  Result Value Ref Range   Alcohol, Ethyl (B) <10 <10 mg/dL  Urine Drug Screen, Qualitative ([facility] only)  Result Value Ref Range   Tricyclic, Ur Screen NONE DETECTED NONE DETECTED   Amphetamines, Ur Screen NONE DETECTED NONE DETECTED   MDMA (Ecstasy)Ur Screen NONE DETECTED NONE DETECTED   Cocaine Metabolite,Ur [place] POSITIVE (A) NONE DETECTED   Opiate, Ur Screen NONE DETECTED NONE DETECTED   Phencyclidine (PCP) Ur S NONE DETECTED NONE DETECTED   Cannabinoid 50 Ng, Ur [place] NONE DETECTED NONE DETECTED   Barbiturates, Ur Screen NONE DETECTED NONE DETECTED   Benzodiazepine, Ur Scrn NONE DETECTED NONE DETECTED   Methadone Scn, Ur NONE DETECTED NONE DETECTED  Basic metabolic panel  Result Value Ref Range   Sodium 139 135 - 145 mmol/L   Potassium 3.7 3.5 - 5.1 mmol/L   Chloride 107 98 - 111 mmol/L   CO2 25 22 - 32 mmol/L   Glucose, Bld 94 70 -  99  mg/dL   BUN 40 (H) 6 - 20 mg/dL   Creatinine, Ser 2.27 (H) 0.61 - 1.24 mg/dL   Calcium 8.3 (L) 8.9 - 10.3 mg/dL   GFR calc non Af Amer 32 (L) >60 mL/min   GFR calc Af Amer 37 (L) >60 mL/min   Anion gap 7 5 - 15  POC SARS Coronavirus 2 Ag  Result Value Ref Range   SARS Coronavirus 2 Ag NEGATIVE NEGATIVE      Assessment & Plan:   Problem List Items Addressed This Visit    None    Visit Diagnoses    Oral pain    -  Primary   Suspect side effect to buprenorphine, but in case thrush infection will start magic mouthwash. Continue to monitor for improvement off buprenorphine   Acute low back pain without sciatica, unspecified back pain laterality       Acute on chronic. R/o UTI, continue current regimen and work with Pain Clinic on tolerable pain regimen   Relevant Orders   UA/M w/rflx Culture, Routine       Follow up plan: Return for as scheduled.

## 2020-01-20 DIAGNOSIS — R52 Pain, unspecified: Secondary | ICD-10-CM | POA: Diagnosis not present

## 2020-01-20 DIAGNOSIS — M545 Low back pain: Secondary | ICD-10-CM | POA: Diagnosis not present

## 2020-01-20 NOTE — Addendum Note (Signed)
Addended by: Delton Prairie on: 01/20/2020 09:53 AM   Modules accepted: Orders

## 2020-01-22 LAB — UA/M W/RFLX CULTURE, ROUTINE
Bilirubin, UA: NEGATIVE
Ketones, UA: NEGATIVE
Leukocytes,UA: NEGATIVE
Nitrite, UA: NEGATIVE
Protein,UA: NEGATIVE
RBC, UA: NEGATIVE
Specific Gravity, UA: 1.02 (ref 1.005–1.030)
Urobilinogen, Ur: 1 mg/dL (ref 0.2–1.0)
pH, UA: 5.5 (ref 5.0–7.5)

## 2020-01-22 LAB — URINE CULTURE, REFLEX

## 2020-01-22 LAB — MICROSCOPIC EXAMINATION
Bacteria, UA: NONE SEEN
RBC, Urine: NONE SEEN /hpf (ref 0–2)

## 2020-01-27 DIAGNOSIS — Z87898 Personal history of other specified conditions: Secondary | ICD-10-CM | POA: Diagnosis not present

## 2020-02-05 ENCOUNTER — Other Ambulatory Visit: Payer: Self-pay | Admitting: Pharmacy Technician

## 2020-02-05 NOTE — Patient Outreach (Signed)
[facility]) Care Management  02/05/2020  [place]. 03-Mar-1966 [id number]   Care coordination in basket message received from embedded [place] in regards to patient's application for [employer]iol[employer] with [employer].  After multiple attempts to obtain patient's valid proof of income for the application process and due to non compliance from patient, the medication assistance case is being closed. Will reopen if information is received.  Will route note to embedded [place] and will remove myself from care team.  Luiz Ochoa. Anyelo Mccue, [place] [facility] [phone]

## 2020-02-06 ENCOUNTER — Other Ambulatory Visit: Payer: Self-pay | Admitting: Family Medicine

## 2020-02-06 NOTE — Telephone Encounter (Signed)
Requested Prescriptions  Pending Prescriptions Disp Refills  . fenofibrate micronized (LOFIBRA) 67 MG capsule [Pharmacy Med Name: FENOFIBRATE MICRONIZED 67 MG CAP] 90 capsule 0    Sig: TAKE 1 CAPSULE EVERY DAY BEFORE BREAKFAST     Cardiovascular:  Antilipid - Fibric Acid Derivatives Failed - 02/06/2020  8:16 AM      Failed - Total Cholesterol in normal range and within 360 days    Cholesterol, Total  Date Value Ref Range Status  09/09/2019 202 (H) 100 - 199 mg/dL Final         Failed - LDL in normal range and within 360 days    LDL Chol Calc (NIH)  Date Value Ref Range Status  09/09/2019 102 (H) 0 - 99 mg/dL Final         Failed - HDL in normal range and within 360 days    HDL  Date Value Ref Range Status  09/09/2019 30 (L) >39 mg/dL Final         Failed - Triglycerides in normal range and within 360 days    Triglycerides  Date Value Ref Range Status  09/09/2019 415 (H) 0 - 149 mg/dL Final         Failed - Cr in normal range and within 180 days    Creatinine  Date Value Ref Range Status  08/12/2014 1.55 (H) 0.60 - 1.30 mg/dL Final   Creatinine, Ser  Date Value Ref Range Status  12/30/2019 2.27 (H) 0.61 - 1.24 mg/dL Final         Failed - eGFR in normal range and within 180 days    EGFR (African American)  Date Value Ref Range Status  08/12/2014 >60  Final   GFR calc Af Amer  Date Value Ref Range Status  12/30/2019 37 (L) >60 mL/min Final   EGFR (Non-African Amer.)  Date Value Ref Range Status  08/12/2014 52 (L)  Final    Comment:    eGFR values <64m/min/1.73 m2 may be an indication of chronic kidney disease (CKD). Calculated eGFR is useful in patients with stable renal function. The eGFR calculation will not be reliable in acutely ill patients when serum creatinine is changing rapidly. It is not useful in  patients on dialysis. The eGFR calculation may not be applicable to patients at the low and high extremes of body sizes, pregnant women, and  vegetarians.    GFR calc non Af Amer  Date Value Ref Range Status  12/30/2019 32 (L) >60 mL/min Final         Passed - ALT in normal range and within 180 days    ALT  Date Value Ref Range Status  12/30/2019 21 0 - 44 U/L Final   SGPT (ALT)  Date Value Ref Range Status  08/12/2014 42 U/L Final    Comment:    14-63 NOTE: New Reference Range 06/29/14          Passed - AST in normal range and within 180 days    AST  Date Value Ref Range Status  12/30/2019 31 15 - 41 U/L Final   SGOT(AST)  Date Value Ref Range Status  08/12/2014 24 15 - 37 Unit/L Final         Passed - Valid encounter within last 12 months    Recent Outpatient Visits          [place] ago [place]  1 month ago Hypertension associated with  diabetes [facility])   [place], [place], [place]   2 months ago Chronic obstructive pulmonary disease with acute exacerbation [facility])   [facility] Merrie Roof [place], [place]   5 months ago Acute bilateral low back pain with bilateral sciatica   [place], [place], [place]   5 months ago Benign hypertensive renal disease   [facility] Volney [place], [place]      Future Appointments            In 3 weeks Orene Desanctis, Lilia Argue, [place], [place]

## 2020-02-12 ENCOUNTER — Other Ambulatory Visit: Payer: Self-pay | Admitting: Family Medicine

## 2020-02-12 NOTE — Telephone Encounter (Signed)
Requested medication (s) are due for refill today: Yes  Requested medication (s) are on the active medication list: Yes  Last refill:  01/19/19  Future visit scheduled: Yes  Notes to clinic:  Prescription has expired.    Requested Prescriptions  Pending Prescriptions Disp Refills   doxycycline (VIBRAMYCIN) 100 MG capsule [Pharmacy Med Name: DOXYCYCLINE HYCLATE 100 MG CAP] 60 capsule     Sig: TAKE 1 CAPSULE BY MOUTH TWICE DAILY      Off-Protocol Failed - 02/12/2020 12:16 PM      Failed - Medication not assigned to a protocol, review manually.      Passed - Valid encounter within last 12 months    Recent Outpatient Visits           3 weeks ago Oral pain   [facility] Merrie Roof [place], [place]   1 month ago Hypertension associated with diabetes [facility])   [place], [place], [place]   2 months ago Chronic obstructive pulmonary disease with acute exacerbation [facility])   [facility] Merrie Roof [place], [place]   5 months ago Acute bilateral low back pain with bilateral sciatica   [place], [place], [place]   5 months ago Benign hypertensive renal disease   [facility] [place], [place]       Future Appointments             In 2 weeks Orene Desanctis, Lilia Argue, PA-C [facility]

## 2020-02-24 ENCOUNTER — Ambulatory Visit (INDEPENDENT_AMBULATORY_CARE_PROVIDER_SITE_OTHER): Payer: Medicare HMO | Admitting: Pharmacist

## 2020-02-24 DIAGNOSIS — E1122 Type 2 diabetes mellitus with diabetic chronic kidney disease: Secondary | ICD-10-CM

## 2020-02-24 DIAGNOSIS — J449 Chronic obstructive pulmonary disease, unspecified: Secondary | ICD-10-CM | POA: Diagnosis not present

## 2020-02-24 DIAGNOSIS — I129 Hypertensive chronic kidney disease with stage 1 through stage 4 chronic kidney disease, or unspecified chronic kidney disease: Secondary | ICD-10-CM | POA: Diagnosis not present

## 2020-02-24 NOTE — Patient Instructions (Signed)
Visit Information  Goals Addressed            This Visit's Progress     Patient Stated   . PharmD "I have a lot of medications" (pt-stated)       Current Barriers:  . Polypharmacy; complex patient with multiple comorbidities including hx cerebral aneurysm, cognitive impairment, depression, chronic headaches, CKD, tobacco abuse, hx polysubstance abuse . His sister, [name], providers care and manages his medications. Notes they have switched to an [place] that should have cheaper medication copays through the mail order pharmacy. She is filling pill boxes a month at a time for him.  o COPD/tobacco abuse: Anoro, though collaboratively decided to apply for [employer] assistance through [employer], as patient would not qualify for [place] assistance for Anoro due to their out of pocket spend requirement. Need updated proof of income. [name] notes that she thinks they received a LIS denial letter from [facility], but is unsure.  o Pain Management: started on Suboxone, but had allergic reaction. Is now on Nucynta 100 mg QID + trazodone 100 mg. Notes they have follow up scheduled tomorrow o HTN: amlodipine 10 mg daily, carvedilol 12.5 mg BID, lisinopril 10 mg daily o Chronic HA: follows w/ HA specialist at [facility]; tizanidine 4 mg QAM, 4 mg Qmidday, 12 mg QPM; topiramate 50 mg BID o Anxiety: duloxetine 60 mg BID, hydroxyzine 25 mg TID  o T2DM: glipizide XL 5 mg daily; due for updated A1c. [name] reports random readings ~130s - Hx GI side effects w/ metformin o Lipids: fenofibrate 67 mg daily; ASCVD risk 37%; noted intolerance to atorvastatin 10 mg d/t myalgias   Pharmacist Clinical Goal(s):  Marland Kitchen Over the next 90 days, patient will work with PharmD and provider towards optimized medication management  Interventions: . Comprehensive medication review performed, medication list updated in electronic medical record.  . Reviewed need for updated income information, as well as [employer] LIS denial letter. [name]  is coming with patient to his PCP appointment next week, and will work to bring this information to me. If she is able to bring it, I will pass along to CPhT to pursue assistance for Stiolto . Praised [name] for seeking an Buyer, retail that provides more suited coverage to their needs. Reviewed that she fills pill boxes on patient's behalf, and has pill boxes at home. She will discuss which medications need refills sent to [facility] mail order w/ PCP next week . Discussed T2DM control. Most recent eGFR is not sufficient for SGLT2 or metformin rechallenge. Could consider GLP1, though patient has hx pancreatitis and there are mixed recommendations regarding GLP1 use, if the cause of pancreatitis was something reversible. Safest options would include glipizide titration to max 10 mg daily vs addition of basal insulin, which has its concerns. Will continue to follow and support PCP pending next lab work  Patient Self Care Activities:  . Patient will take medications as prescribed . Patient will commit to reducing sweet beverage consumption.  Please see past updates related to this goal by clicking on the "Past Updates" button in the selected goal         Patient verbalizes understanding of instructions provided today.   Plan:  - Scheduled f/u call 04/13/20  Catie Darnelle Maffucci, PharmD, [place] [phone]

## 2020-02-24 NOTE — Chronic Care Management (AMB) (Signed)
Chronic Care Management   Follow Up Note   02/24/2020 Name: Derek Cooley. MRN: [id number] DOB: 1966/01/20  Referred by: Volney American, PA-C Reason for referral : Chronic Care Management (Medication Management)   Derek Cooley. is a 54 y.o. year old male who is a primary care patient of Volney American, [place]. The CCM team was consulted for assistance with chronic disease management and care coordination needs.   Contacted patient's sister, Derek Cooley, for medication management review.  Review of patient status, including review of consultants reports, relevant laboratory and other test results, and collaboration with appropriate care team members and the patient's provider was performed as part of comprehensive patient evaluation and provision of chronic care management services.    SDOH (Social Determinants of Health) assessments performed: No See Care Plan activities for detailed interventions related to [facility])     Outpatient Encounter Medications as of 02/24/2020  Medication Sig Note  . albuterol (PROVENTIL) (2.5 MG/3ML) 0.083% nebulizer solution Take 3 mLs (2.5 mg total) by nebulization every 6 (six) hours as needed for wheezing or shortness of breath.   Marland Kitchen amLODipine (NORVASC) 10 MG tablet Take 1 tablet (10 mg total) by mouth daily. 11/11/2019: QAM  . Blood Glucose Monitoring Suppl (ONE TOUCH ULTRA 2) w/Device KIT    . carvedilol (COREG) 12.5 MG tablet Take 1 tablet (12.5 mg total) by mouth 2 (two) times a day.   Marland Kitchen doxycycline (VIBRAMYCIN) 100 MG capsule TAKE 1 CAPSULE BY MOUTH TWICE DAILY   . DULoxetine (CYMBALTA) 60 MG capsule TAKE 1 CAPSULE TWICE DAILY   . fenofibrate micronized (LOFIBRA) 67 MG capsule TAKE 1 CAPSULE EVERY DAY BEFORE BREAKFAST   . glipiZIDE (GLUCOTROL XL) 5 MG 24 hr tablet TAKE ONE TABLET EACH MORNING WITH BREAKFAST   . hydrOXYzine (VISTARIL) 25 MG capsule Take 1 capsule (25 mg total) by mouth 2 (two) times daily as needed for anxiety. For  anxiety attacks (Patient taking differently: Take 25 mg by mouth 3 (three) times daily. For anxiety attacks)   . ibuprofen (ADVIL) 800 MG tablet Take 1 tablet (800 mg total) by mouth every 8 (eight) hours as needed.   . Lancets (ONETOUCH DELICA PLUS [id number]) [place]    . lisinopril (ZESTRIL) 10 MG tablet TAKE 1 TABLET BY MOUTH DAILY   . ONETOUCH ULTRA test strip    . pantoprazole (PROTONIX) 40 MG tablet Take 1 tablet (40 mg total) by mouth 2 (two) times daily as needed.   . Tapentadol HCl (NUCYNTA) 100 MG TABS Take 100 mg by mouth every 6 (six) hours.   . Thiamine HCl (THIAMINE PO) Take 100 mg by mouth daily.   Marland Kitchen tiZANidine (ZANAFLEX) 4 MG tablet TAKE ONE TABLET TWICE DAILY AS NEEDED FOR MUSCLE SPASM 02/24/2020: Taking five times daily   . topiramate (TOPAMAX) 50 MG tablet 50 mg 2 (two) times daily.    . traZODone (DESYREL) 100 MG tablet Take 100 mg by mouth at bedtime.   Marland Kitchen albuterol (VENTOLIN HFA) 108 (90 Base) MCG/ACT inhaler Inhale 1-2 puffs into the lungs every 6 (six) hours as needed for wheezing or shortness of breath. (Patient not taking: Reported on 02/24/2020)   . docusate (COLACE) 60 MG/15ML syrup Take 100 mg by mouth 2 (two) times daily as needed.   . fluticasone (FLONASE) 50 MCG/ACT nasal spray Place 2 sprays into both nostrils daily.   Marland Kitchen umeclidinium-vilanterol (ANORO ELLIPTA) 62.5-25 MCG/INH AEPB Inhale 1 puff into the lungs daily. (Patient not taking: Reported on  02/24/2020)   . [DISCONTINUED] magic mouthwash w/lidocaine SOLN Take 5 mLs by mouth 3 (three) times daily as needed for mouth pain.    No facility-administered encounter medications on file as of 02/24/2020.     Objective:   Goals Addressed            This Visit's Progress     Patient Stated   . PharmD "I have a lot of medications" (pt-stated)       Current Barriers:  . Polypharmacy; complex patient with multiple comorbidities including hx cerebral aneurysm, cognitive impairment, depression, chronic headaches, CKD,  tobacco abuse, hx polysubstance abuse . His sister, Derek Cooley Reel, providers care and manages his medications. Notes they have switched to an [place] that should have cheaper medication copays through the mail order pharmacy. She is filling pill boxes a month at a time for him.  o COPD/tobacco abuse: Anoro, though collaboratively decided to apply for [employer] assistance through [employer], as patient would not qualify for [place] assistance for Anoro due to their out of pocket spend requirement. Need updated proof of income. Derek Cooley notes that she thinks they received a LIS denial letter from [facility], but is unsure.  o Pain Management: started on Suboxone, but had allergic reaction. Is now on Nucynta 100 mg QID + trazodone 100 mg. Notes they have follow up scheduled tomorrow o HTN: amlodipine 10 mg daily, carvedilol 12.5 mg BID, lisinopril 10 mg daily o Chronic HA: follows w/ HA specialist at [facility]; tizanidine 4 mg QAM, 4 mg Qmidday, 12 mg QPM; topiramate 50 mg BID o Anxiety: duloxetine 60 mg BID, hydroxyzine 25 mg TID  o T2DM: glipizide XL 5 mg daily; due for updated A1c. Derek Cooley reports random readings ~130s - Hx GI side effects w/ metformin o Lipids: fenofibrate 67 mg daily; ASCVD risk 37%; noted intolerance to atorvastatin 10 mg d/t myalgias   Pharmacist Clinical Goal(s):  Marland Kitchen Over the next 90 days, patient will work with PharmD and provider towards optimized medication management  Interventions: . Comprehensive medication review performed, medication list updated in electronic medical record.  . Reviewed need for updated income information, as well as [employer] LIS denial letter. Derek Cooley is coming with patient to his PCP appointment next week, and will work to bring this information to me. If she is able to bring it, I will pass along to CPhT to pursue assistance for Stiolto . Praised Derek Cooley for seeking an Buy[employer], retail that provides more suited coverage to their needs. Reviewed that she fills pill boxes on  patient's behalf, and has pill boxes at home. She will discuss which medications need refills sent to [facility] mail order w/ PCP next week . Discussed T2DM control. Most recent eGFR is not sufficient for SGLT2 or metformin rechallenge. Could consider GLP1, though patient has hx pancreatitis and there are mixed recommendations regarding GLP1 use, if the cause of pancreatitis was something reversible. Safest options would include glipizide titration to max 10 mg daily vs addition of basal insulin, which has its concerns. Will continue to follow and support PCP pending next lab work  Patient Self Care Activities:  . Patient will take medications as prescribed . Patient will commit to reducing sweet beverage consumption.  Please see past updates related to this goal by clicking on the "Past Updates" button in the selected goal          Plan:  - Scheduled f/u call 04/13/20  Catie Darnelle Maffucci, PharmD, [place] [phone]

## 2020-02-29 ENCOUNTER — Other Ambulatory Visit: Payer: Self-pay | Admitting: Pain Medicine

## 2020-02-29 DIAGNOSIS — M545 Low back pain, unspecified: Secondary | ICD-10-CM

## 2020-03-02 ENCOUNTER — Ambulatory Visit (INDEPENDENT_AMBULATORY_CARE_PROVIDER_SITE_OTHER): Payer: Medicare HMO | Admitting: Family Medicine

## 2020-03-02 ENCOUNTER — Other Ambulatory Visit: Payer: Self-pay

## 2020-03-02 ENCOUNTER — Other Ambulatory Visit: Payer: Self-pay | Admitting: Family Medicine

## 2020-03-02 ENCOUNTER — Encounter: Payer: Self-pay | Admitting: Family Medicine

## 2020-03-02 VITALS — BP 156/98 | HR 73 | Temp 98.4°F | Ht 67.5 in | Wt 247.0 lb

## 2020-03-02 DIAGNOSIS — R400 Somnolence: Secondary | ICD-10-CM | POA: Diagnosis not present

## 2020-03-02 DIAGNOSIS — F419 Anxiety disorder, unspecified: Secondary | ICD-10-CM | POA: Diagnosis not present

## 2020-03-02 DIAGNOSIS — Z1211 Encounter for screening for malignant neoplasm of colon: Secondary | ICD-10-CM

## 2020-03-02 DIAGNOSIS — F331 Major depressive disorder, recurrent, moderate: Secondary | ICD-10-CM | POA: Diagnosis not present

## 2020-03-02 MED ORDER — LISINOPRIL 20 MG PO TABS: 20.0000 mg | ORAL_TABLET | Freq: Every day | ORAL | 0 refills | Status: DC

## 2020-03-02 NOTE — Progress Notes (Signed)
BP (!) 156/98   Pulse 73   Temp 98.4 F (36.9 C) (Oral)   Ht 5' 7.5" (1.715 m)   Wt 247 lb (112 kg)   SpO2 95%   BMI 38.11 kg/m    Subjective:    Patient ID: Derek Binet., male    DOB: 1965/12/15, 54 y.o.   MRN: [id number]  HPI: Derek Cooley. is a 54 y.o. male  Chief Complaint  Patient presents with  . Altered Mental Status   Sister who is primary caregiver presents today with him. She's very concernd about the changes she's seen in him since working with new Pain Clinic, who has started a significant dose of muscle relaxers in addition to numerous other sedating medications. Has been napping often, sometimes slurring speech, drowsy and disoriented the past week since starting the medication. Denies extremity numbness or weakness, syncope, facial droop, recent falls, fever, chills, urinary sxs. Does have a hx of aneurysm, coil in place as well as migraines, both followed by Neurology. Also has a hx of substance abuse s/p multiple overdoses. Sister states he has not used any substances not prescribed in the past week since sxs began.   Depression screen [facility] 2/9 03/02/2020 11/18/2019 06/06/2018  Decreased Interest 3 0 3  Down, Depressed, Hopeless 1 0 3  PHQ - 2 Score 4 0 6  Altered sleeping 3 - 3  Tired, decreased energy 3 - 3  Change in appetite 3 - 3  Feeling bad or failure about yourself  1 - 3  Trouble concentrating 3 - 3  Moving slowly or fidgety/restless 3 - 2  Suicidal thoughts 1 - 0  PHQ-9 Score 21 - 23  Difficult doing work/chores - - Very difficult   GAD 7 : Generalized Anxiety Score 06/06/2018  Nervous, Anxious, on Edge 3  Control/stop worrying 2  Worry too much - different things 3  Trouble relaxing 3  Restless 2  Easily annoyed or irritable 2  Afraid - awful might happen 3  Total GAD 7 Score 18  Anxiety Difficulty Very difficult     Relevant past medical, surgical, family and social history reviewed and updated as indicated. Interim medical  history since our last visit reviewed. Allergies and medications reviewed and updated.  Review of Systems  Per HPI unless specifically indicated above     Objective:    BP (!) 156/98   Pulse 73   Temp 98.4 F (36.9 C) (Oral)   Ht 5' 7.5" (1.715 m)   Wt 247 lb (112 kg)   SpO2 95%   BMI 38.11 kg/m   Wt Readings from Last 3 Encounters:  03/04/20 245 lb (111.1 kg)  03/02/20 247 lb (112 kg)  01/19/20 246 lb 6 oz (111.8 kg)    Physical Exam Vitals and nursing note reviewed.  Constitutional:      Appearance: Normal appearance.  HENT:     Head: Atraumatic.  Eyes:     Extraocular Movements: Extraocular movements intact.     Conjunctiva/sclera: Conjunctivae normal.  Cardiovascular:     Rate and Rhythm: Normal rate and regular rhythm.  Pulmonary:     Effort: Pulmonary effort is normal.     Breath sounds: Normal breath sounds.  Musculoskeletal:        General: Normal range of motion.     Cervical back: Normal range of motion and neck supple.  Skin:    General: Skin is warm and dry.  Neurological:     General:  No focal deficit present.     Comments: Mildly sedated, but overall near baseline  Psychiatric:        Mood and Affect: Mood normal.        Thought Content: Thought content normal.        Judgment: Judgment normal.     Results for orders placed or performed in visit on 01/19/20  Microscopic Examination   URINE  Result Value Ref Range   WBC, UA 0-5 0 - 5 /hpf   RBC None seen 0 - 2 /hpf   Epithelial Cells (non renal) 0-10 0 - 10 /hpf   Crystals Present N/A   Crystal Type Amorphous Sediment N/A   Bacteria, UA None seen None seen/Few  Urine Culture, Reflex   URINE  Result Value Ref Range   Urine Culture, Routine Final report    Organism ID, Bacteria Comment   UA/M w/rflx Culture, Routine   Specimen: Urine   URINE  Result Value Ref Range   Specific Gravity, UA 1.020 1.005 - 1.030   pH, UA 5.5 5.0 - 7.5   Color, UA Yellow Yellow   Appearance Ur Cloudy (A)  Clear   Leukocytes,UA Negative Negative   Protein,UA Negative Negative/Trace   Glucose, UA Trace (A) Negative   Ketones, UA Negative Negative   RBC, UA Negative Negative   Bilirubin, UA Negative Negative   Urobilinogen, Ur 1.0 0.2 - 1.0 mg/dL   Nitrite, UA Negative Negative   Microscopic Examination See below:    Urinalysis Reflex Comment       Assessment & Plan:   Problem List Items Addressed This Visit      Other   Depression    Was unable to establish last year with [place] due to scheduling conflicts and COVID. Requesting new referral to establish care. Continue current regimen in meantime      Relevant Orders   Ambulatory referral to Psychiatry   Chronic anxiety    Was unable to establish last year with [place] due to scheduling conflicts and COVID. Requesting new referral to establish care. Continue current regimen in meantime      Relevant Orders   Ambulatory referral to Psychiatry   Altered mental status - Primary    Neurologic exam intact today, and sxs directly started with new muscle relaxer being added on. Discussed calling Pain Mgmt provider, holding dose in meantime and getting advice on dosing going forward. Discussed imaging of head/brain to r/o bleed, new CVA - pt and sister wanting to hold off for now and monitor for improvement. Strict ER precautions reviewed       Other Visit Diagnoses    Colon cancer screening       Relevant Orders   Ambulatory referral to Gastroenterology       Follow up plan: Return in about 2 weeks (around 03/16/2020) for BP, 6 month f/u.

## 2020-03-02 NOTE — Telephone Encounter (Signed)
Prescribed by [facility] Provider, Ursula Alert.

## 2020-03-04 ENCOUNTER — Ambulatory Visit (INDEPENDENT_AMBULATORY_CARE_PROVIDER_SITE_OTHER): Payer: Self-pay | Admitting: Gastroenterology

## 2020-03-04 ENCOUNTER — Other Ambulatory Visit: Payer: Self-pay | Admitting: Family Medicine

## 2020-03-04 ENCOUNTER — Other Ambulatory Visit (INDEPENDENT_AMBULATORY_CARE_PROVIDER_SITE_OTHER): Payer: Self-pay

## 2020-03-04 VITALS — Ht 67.0 in | Wt 245.0 lb

## 2020-03-04 DIAGNOSIS — Z1211 Encounter for screening for malignant neoplasm of colon: Secondary | ICD-10-CM

## 2020-03-04 NOTE — Telephone Encounter (Signed)
Requested medication (s) are due for refill today:yes  Requested medication (s) are on the active medication list:yes historic provider  Last refill: 07/22/18  Future visit scheduled: yes  Notes to clinic:  Last ordered by Ursula Alert  MD Rx expired 07/23/19    Requested Prescriptions  Pending Prescriptions Disp Refills   hydrOXYzine (ATARAX/VISTARIL) 50 MG tablet [Pharmacy Med Name: HYDROXYZINE HCL 50 MG TAB] 90 tablet     Sig: TAKE ONE TABLET 3 TIMES DAILY AS NEEDED      Ear, Nose, and Throat:  Antihistamines Passed - 03/04/2020  3:35 PM      Passed - Valid encounter within last 12 months    Recent Outpatient Visits           2 days ago Colon cancer screening   [facility] Volney American, [place]   1 month ago Oral pain   [place], [place], [place]   2 months ago Hypertension associated with diabetes [facility])   [facility], [place], [place]   3 months ago Chronic obstructive pulmonary disease with acute exacerbation [facility])   [facility] Merrie Roof [place], [place]   5 months ago Acute bilateral low back pain with bilateral sciatica   [facility], Lilia Argue, [place]       Future Appointments             In 1 month Orene Desanctis, Lilia Argue, [place], [facility]

## 2020-03-04 NOTE — Progress Notes (Signed)
Gastroenterology Pre-Procedure Review  Request Date: 04/06/20 Requesting Physician: Dr. Marius Ditch  PATIENT REVIEW QUESTIONS: The patient gave verbal permission to have his sister [name] Reel to answer and triage, and sign up for his mychart.    1. Are you having any GI issues? yes (indigestion) 2. Do you have a personal history of Polyps? no 3. Do you have a family history of Colon Cancer or Polyps? yes (polyps -mother) 4. Diabetes Mellitus? yes (type 2) 5. Joint replacements in the past 12 months?no 6. Major health problems in the past 3 months?no 7. Any artificial heart valves, MVP, or defibrillator?no    MEDICATIONS & ALLERGIES:    Patient reports the following regarding taking any anticoagulation/antiplatelet therapy:   Plavix, Coumadin, Eliquis, Xarelto, Lovenox, Pradaxa, Brilinta, or Effient? no Aspirin? no  Patient confirms/reports the following medications:  Current Outpatient Medications  Medication Sig Dispense Refill  . albuterol (PROVENTIL) (2.5 MG/3ML) 0.083% nebulizer solution Take 3 mLs (2.5 mg total) by nebulization every 6 (six) hours as needed for wheezing or shortness of breath. 150 mL 1  . albuterol (VENTOLIN HFA) 108 (90 Base) MCG/ACT inhaler Inhale 1-2 puffs into the lungs every 6 (six) hours as needed for wheezing or shortness of breath. 18 g 3  . amLODipine (NORVASC) 10 MG tablet Take 1 tablet (10 mg total) by mouth daily. 90 tablet 3  . Blood Glucose Monitoring Suppl (ONE TOUCH ULTRA 2) w/Device KIT     . carvedilol (COREG) 12.5 MG tablet Take 1 tablet (12.5 mg total) by mouth 2 (two) times a day. 60 tablet 5  . docusate (COLACE) 60 MG/15ML syrup Take 100 mg by mouth 2 (two) times daily as needed.    . doxycycline (VIBRAMYCIN) 100 MG capsule TAKE 1 CAPSULE BY MOUTH TWICE DAILY 60 capsule 0  . DULoxetine (CYMBALTA) 60 MG capsule TAKE 1 CAPSULE TWICE DAILY 90 capsule 1  . fenofibrate micronized (LOFIBRA) 67 MG capsule TAKE 1 CAPSULE EVERY DAY BEFORE BREAKFAST 90  capsule 0  . fluticasone (FLONASE) 50 MCG/ACT nasal spray Place 2 sprays into both nostrils daily. 16 g 12  . glipiZIDE (GLUCOTROL XL) 5 MG 24 hr tablet TAKE ONE TABLET EACH MORNING WITH BREAKFAST 30 tablet 2  . hydrOXYzine (VISTARIL) 25 MG capsule Take 1 capsule (25 mg total) by mouth 2 (two) times daily as needed for anxiety. For anxiety attacks (Patient taking differently: Take 25 mg by mouth 3 (three) times daily. For anxiety attacks) 180 capsule 1  . ibuprofen (ADVIL) 800 MG tablet Take 1 tablet (800 mg total) by mouth every 8 (eight) hours as needed. 30 tablet 0  . Lancets (ONETOUCH DELICA PLUS [id number]) [place]     . lisinopril (ZESTRIL) 20 MG tablet Take 1 tablet (20 mg total) by mouth daily. 30 tablet 0  . methocarbamol (ROBAXIN) 750 MG tablet Take 1,500 mg by mouth 4 (four) times daily.    Glory Rosebush ULTRA test strip     . pantoprazole (PROTONIX) 40 MG tablet Take 1 tablet (40 mg total) by mouth 2 (two) times daily as needed. 60 tablet 2  . pregabalin (LYRICA) 50 MG capsule Take 50 mg by mouth 4 (four) times daily.    . Tapentadol HCl (NUCYNTA) 100 MG TABS Take 100 mg by mouth every 6 (six) hours.    . Thiamine HCl (THIAMINE PO) Take 100 mg by mouth daily.    Marland Kitchen tiZANidine (ZANAFLEX) 4 MG tablet TAKE ONE TABLET TWICE DAILY AS NEEDED FOR MUSCLE SPASM 60 tablet 0  .  topiramate (TOPAMAX) 50 MG tablet 50 mg 2 (two) times daily.     . traZODone (DESYREL) 100 MG tablet Take 100 mg by mouth at bedtime.    Marland Kitchen umeclidinium-vilanterol (ANORO ELLIPTA) 62.5-25 MCG/INH AEPB Inhale 1 puff into the lungs daily. 60 each 2   No current facility-administered medications for this visit.    Patient confirms/reports the following allergies:  Allergies  Allergen Reactions  . Atorvastatin     Joint Aches - Severe Joint Aches - Severe Joint Aches - Severe  . Avelox [Moxifloxacin Hcl In Nacl]     Muscle pain  . Buprenorphine     Mouth sores, confusion, shaking  . Dilaudid [Hydromorphone Hcl] Hives  .  Fluoxetine Itching  . Levofloxacin Other (See Comments)    Joint Pain  . Morphine And Related   . Other     Muscle pain  . Vancomycin     Renal insufficiency    No orders of the defined types were placed in this encounter.   AUTHORIZATION INFORMATION Primary Insurance: 1D#: Group #:  Secondary Insurance: 1D#: Group #:  SCHEDULE INFORMATION: Date:  Time: Location:

## 2020-03-10 ENCOUNTER — Other Ambulatory Visit: Payer: Self-pay | Admitting: Pharmacy Technician

## 2020-03-10 NOTE — Patient Outreach (Signed)
[facility]) Care Management  03/10/2020  Derek Cooley. 01-26-1966 [id number]  Received patient's updated income information and submitted to BI in regards to [facility] [place] application.  Will follow up with BI in 5-15 business days to inquire on status of application.  Carmelia Tiner P. Rachel Rison, [place]  [phone]

## 2020-03-14 ENCOUNTER — Other Ambulatory Visit: Payer: Self-pay

## 2020-03-14 ENCOUNTER — Ambulatory Visit
Admission: RE | Admit: 2020-03-14 | Discharge: 2020-03-14 | Disposition: A | Discharge status: Home / Self Care | Payer: Medicare HMO | Source: Ambulatory Visit | Attending: Pain Medicine | Admitting: Pain Medicine

## 2020-03-14 DIAGNOSIS — M545 Low back pain, unspecified: Secondary | ICD-10-CM

## 2020-03-14 IMAGING — MR MR LUMBAR SPINE W/O CM
5 series · 31 of 48 positions shown · non-contrast
Comparison: None.

CLINICAL DATA: Fell from ladder 1 year ago. Low back pain radiating
to the hips.

EXAM:
MRI LUMBAR SPINE WITHOUT CONTRAST
TECHNIQUE: Multiplanar, multisequence MR imaging of the lumbar spine was
performed. No intravenous contrast was administered.

[Series 5: T2 · sagittal · 4.0mm · 0.81mm/px · 6 of 17 slices shown (1 of 2)]
[im 1/17]
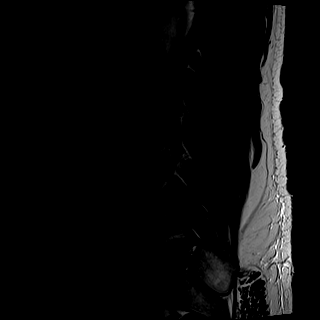
[im 4/17]
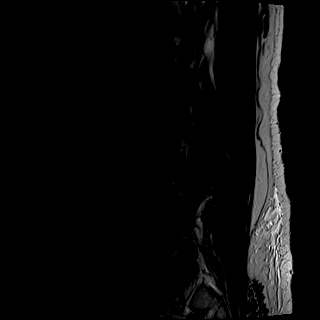
[im 7/17]
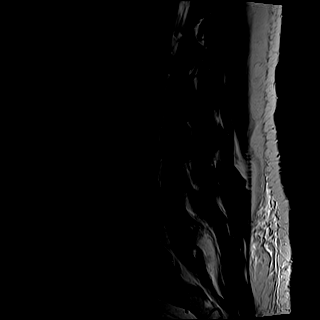
[im 10/17]
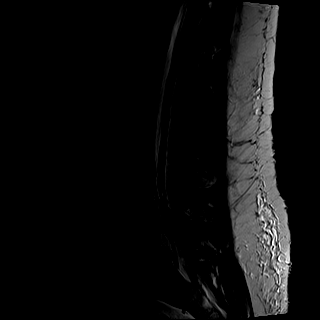
[im 13/17]
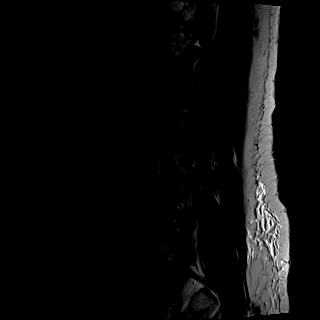
[im 17/17]
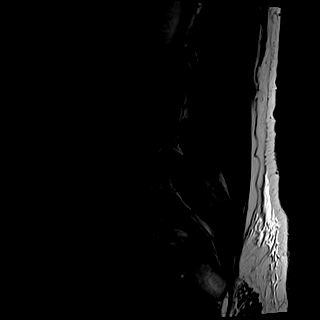

[Series 6: T1 · sagittal · 4.0mm · 0.81mm/px · 6 of 17 slices shown (1 of 2)]
[im 1/17]
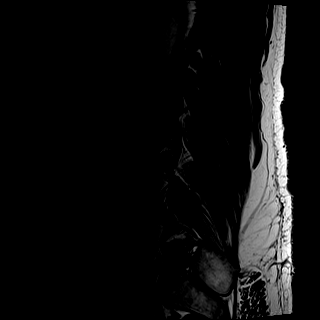
[im 4/17]
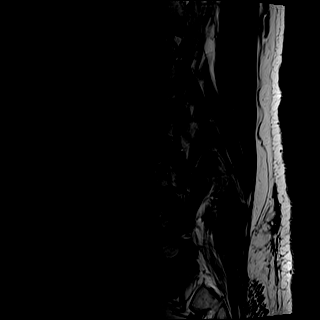
[im 7/17]
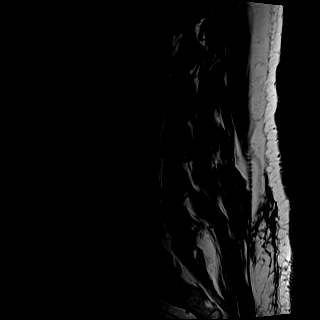
[im 10/17]
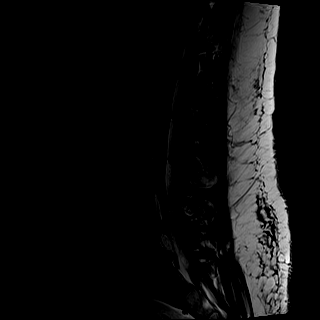
[im 13/17]
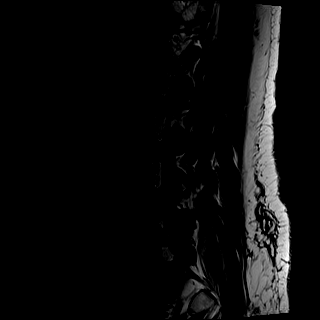
[im 17/17]
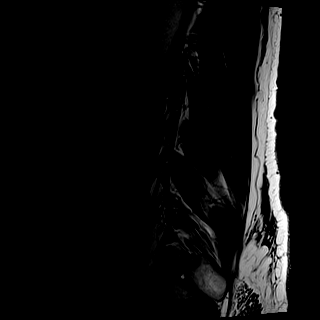

[Series 7: STIR · sagittal · 4.0mm · 0.41mm/px · 1 of 17 slices shown]
[im 1/17]
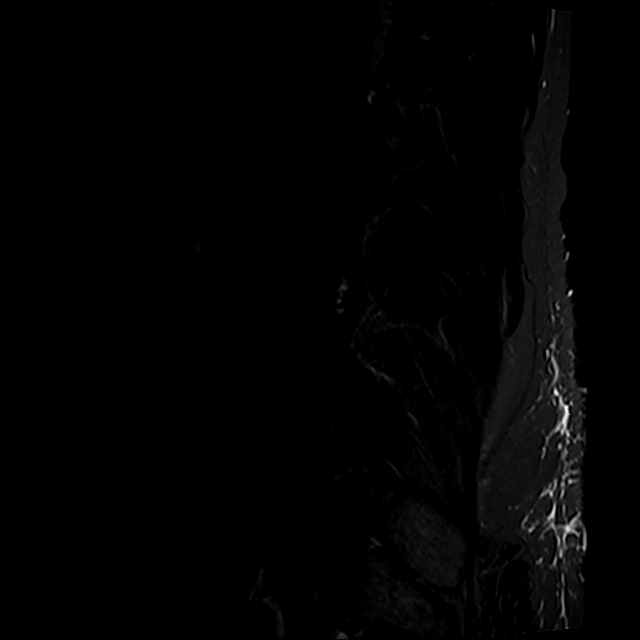

[Series 8: T2 · axial · 4.0mm · 0.78mm/px · z∈[-176,+48]mm · 9 of 39 slices shown (2 of 2)]
[im 1/39]
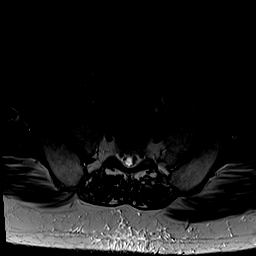
[im 6/39]
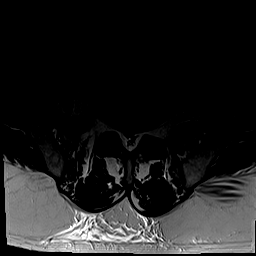
[im 11/39]
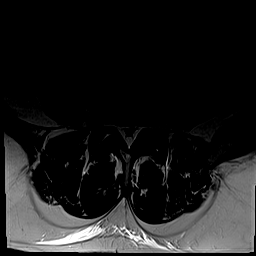
[im 17/39]
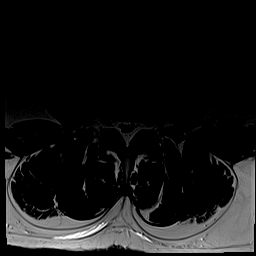
[im 20/39]
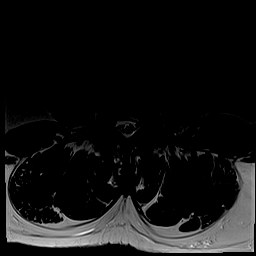
[im 22/39]
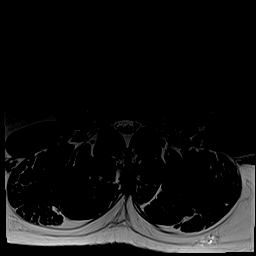
[im 28/39]
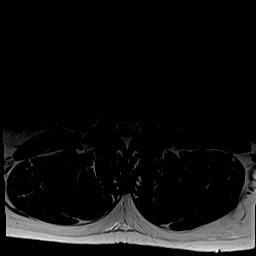
[im 33/39]
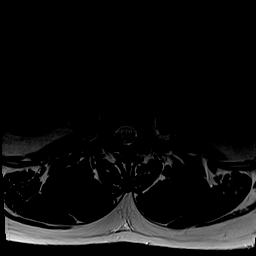
[im 39/39]
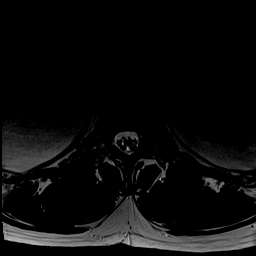

[Series 9: T1 · axial · 4.0mm · 0.39mm/px · z∈[-176,+48]mm · 9 of 39 slices shown (2 of 2)]
[im 1/39]
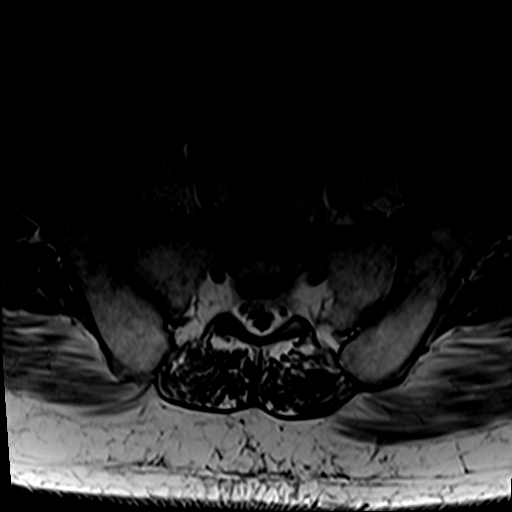
[im 6/39]
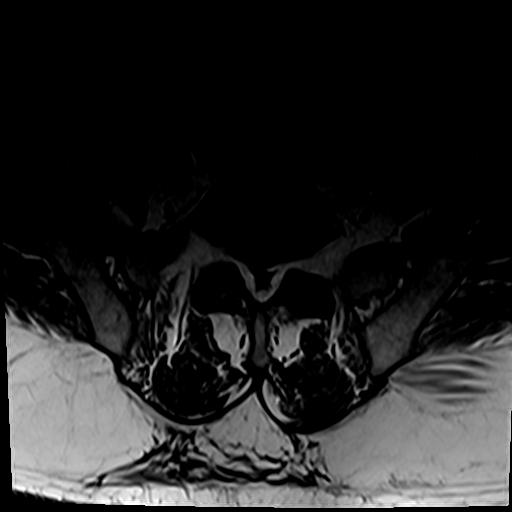
[im 11/39]
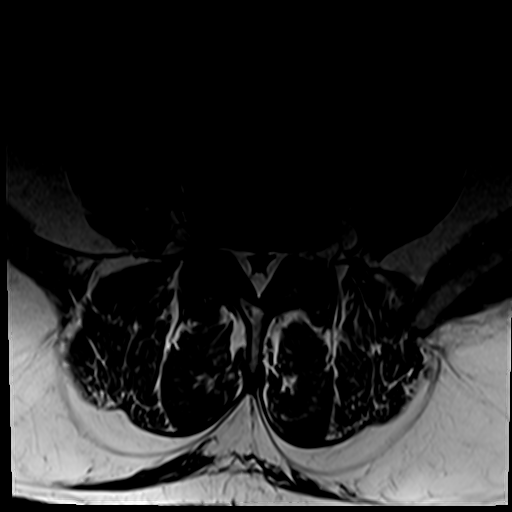
[im 17/39]
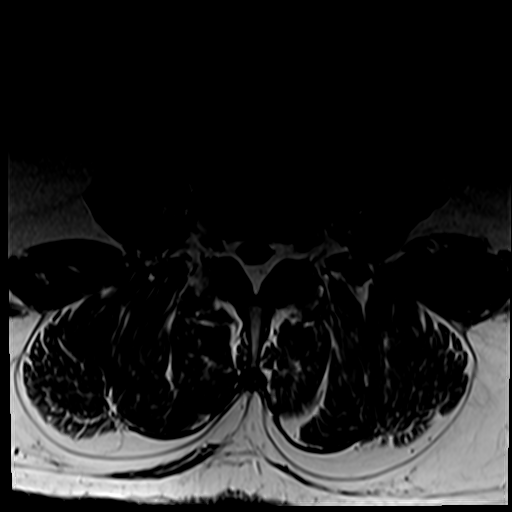
[im 20/39]
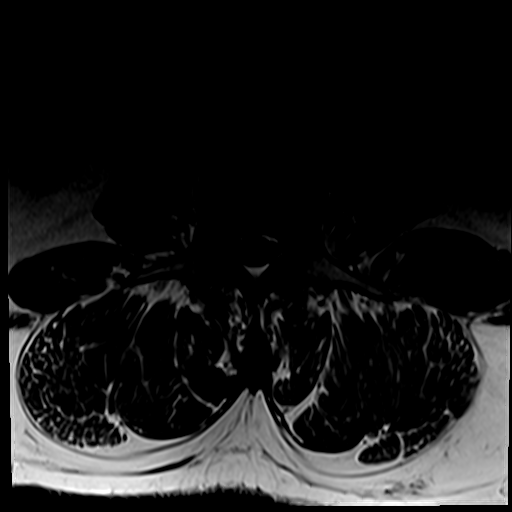
[im 22/39]
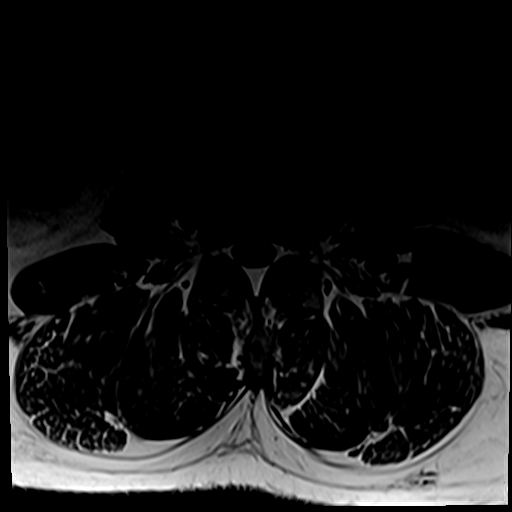
[im 28/39]
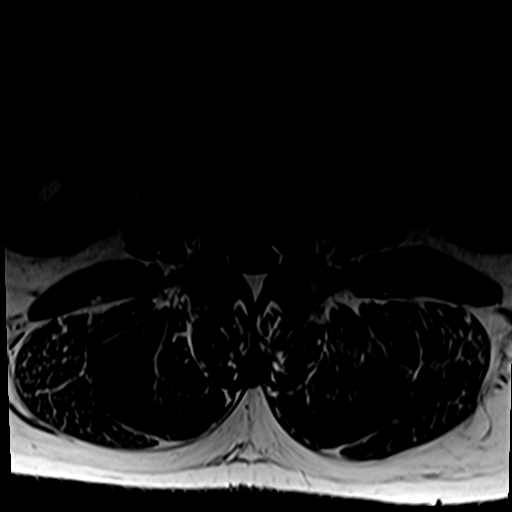
[im 33/39]
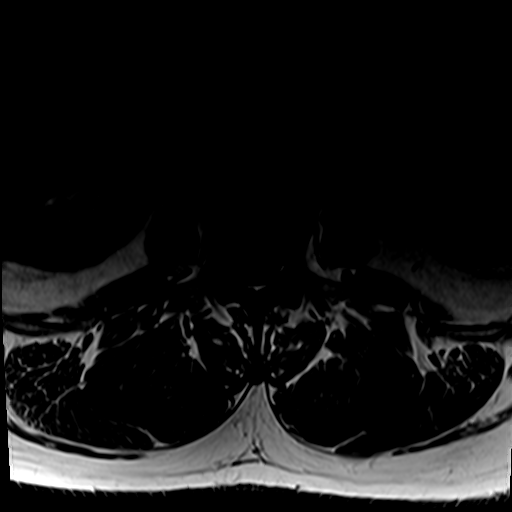
[im 39/39]
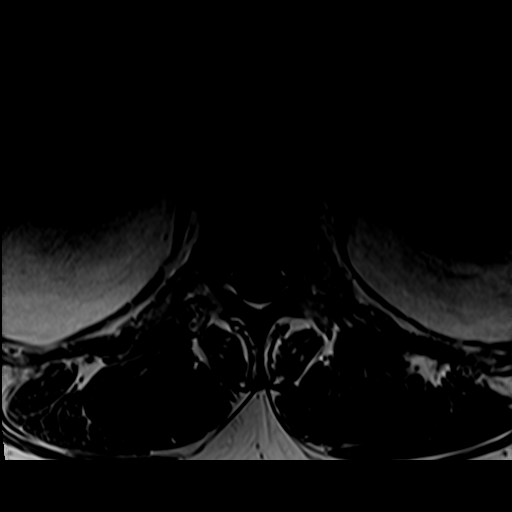

[31 of 48 positions shown; findings below may reference images not displayed]

FINDINGS: Segmentation:  Is 5 lumbar type vertebral bodies.

Alignment:  Normal

Vertebrae:  No fracture or primary bone lesion.

Conus medullaris and cauda equina: Conus extends to the T12-L1
level. Conus and cauda equina appear normal.

Paraspinal and other soft tissues: Negative

Disc levels:

No significant finding at T12-L1, L1-2 or L2-3.

L3-4: Mild desiccation and bulging of the disc. Abundant epidural
fat. Constriction of the thecal sac secondary to the epidural
lipomatosis. No focal neural compression is seen.

L4-5: Desiccation and mild bulging of the disc. Abundant epidural
fat. Constriction of the thecal sac secondary to epidural
lipomatosis. Bilateral facet arthropathy, worse on the right than
the left. This could be a cause of back pain or referred facet
syndrome pain. It could also allow anterolisthesis to occur with
standing or flexion.

L5-S1: Disc degeneration with loss of disc height and small central
disc protrusion. Abundant epidural fat with constriction of the
thecal sac. Mild facet osteoarthritis. No focal nerve compression.
IMPRESSION: The patient has abundant epidural fat throughout the lumbar region,
with constriction of the thecal sac. Occasionally, epidural
lipomatosis can be symptomatic.

At the L4-5 level, in addition to bulging of the disc, there is also
bilateral facet arthropathy worse on the right than the left, which
could be associated with back pain or referred facet syndrome pain.
Additionally, anterolisthesis could occur with standing or flexion.

## 2020-03-14 NOTE — Assessment & Plan Note (Signed)
Neurologic exam intact today, and sxs directly started with new muscle relaxer being added on. Discussed calling Pain Mgmt provider, holding dose in meantime and getting advice on dosing going forward. Discussed imaging of head/brain to r/o bleed, new CVA - pt and sister wanting to hold off for now and monitor for improvement. Strict ER precautions reviewed

## 2020-03-14 NOTE — Assessment & Plan Note (Signed)
Was unable to establish last year with [facility] due to scheduling conflicts and COVID. Requesting new referral to establish care. Continue current regimen in meantime

## 2020-03-14 NOTE — Assessment & Plan Note (Signed)
Was unable to establish last year with [facility] due to scheduling conflicts and COVID. Requesting new referral to establish care. Continue current regimen in meantime

## 2020-03-16 ENCOUNTER — Other Ambulatory Visit: Payer: Self-pay | Admitting: Family Medicine

## 2020-03-18 ENCOUNTER — Other Ambulatory Visit: Payer: Self-pay | Admitting: Pharmacy Technician

## 2020-03-18 NOTE — Patient Outreach (Signed)
[facility]) Care Management  03/18/2020  [place]. [birth year] [id number]   Care coordination call placed to BI in regards to [facility] application.  Spoke to [place] who informed patient was APPROVED 03/16/2020-12/09/2020. She informed the medication was shipped today and should arrive to patient's home in the next 5-7 business days.  Will follow up with patient in 7-10 business days to confirm.  Dalena Plantz P. Donelda Mailhot, [place]  [phone]

## 2020-03-23 ENCOUNTER — Other Ambulatory Visit: Payer: Self-pay

## 2020-03-23 ENCOUNTER — Encounter: Payer: Self-pay | Admitting: Emergency Medicine

## 2020-03-23 ENCOUNTER — Emergency Department
Admission: EM | Admit: 2020-03-23 | Discharge: 2020-03-23 | Discharge status: Against Medical Advice | Payer: Medicare HMO | Attending: Emergency Medicine | Admitting: Emergency Medicine

## 2020-03-23 ENCOUNTER — Emergency Department: Payer: Medicare HMO

## 2020-03-23 DIAGNOSIS — Z5321 Procedure and treatment not carried out due to patient leaving prior to being seen by health care provider: Secondary | ICD-10-CM | POA: Diagnosis not present

## 2020-03-23 DIAGNOSIS — R4182 Altered mental status, unspecified: Secondary | ICD-10-CM | POA: Diagnosis not present

## 2020-03-23 LAB — URINALYSIS, COMPLETE (UACMP) WITH MICROSCOPIC
Bacteria, UA: NONE SEEN
Bilirubin Urine: NEGATIVE
Glucose, UA: NEGATIVE mg/dL
Hgb urine dipstick: NEGATIVE
Ketones, ur: NEGATIVE mg/dL
Leukocytes,Ua: NEGATIVE
Nitrite: NEGATIVE
Protein, ur: NEGATIVE mg/dL
Specific Gravity, Urine: 1.016 (ref 1.005–1.030)
pH: 7 (ref 5.0–8.0)

## 2020-03-23 LAB — CBC WITH DIFFERENTIAL/PLATELET
Abs Immature Granulocytes: 0.05 10*3/uL (ref 0.00–0.07)
Basophils Absolute: 0.1 10*3/uL (ref 0.0–0.1)
Basophils Relative: 1 %
Eosinophils Absolute: 0.3 10*3/uL (ref 0.0–0.5)
Eosinophils Relative: 3 %
HCT: 47.4 % (ref 39.0–52.0)
Hemoglobin: 15.8 g/dL (ref 13.0–17.0)
Immature Granulocytes: 1 %
Lymphocytes Relative: 31 %
Lymphs Abs: 2.6 10*3/uL (ref 0.7–4.0)
MCH: 29.9 pg (ref 26.0–34.0)
MCHC: 33.3 g/dL (ref 30.0–36.0)
MCV: 89.8 fL (ref 80.0–100.0)
Monocytes Absolute: 0.6 10*3/uL (ref 0.1–1.0)
Monocytes Relative: 7 %
Neutro Abs: 5 10*3/uL (ref 1.7–7.7)
Neutrophils Relative %: 57 %
Platelets: 254 10*3/uL (ref 150–400)
RBC: 5.28 MIL/uL (ref 4.22–5.81)
RDW: 12.9 % (ref 11.5–15.5)
WBC: 8.6 10*3/uL (ref 4.0–10.5)
nRBC: 0 % (ref 0.0–0.2)

## 2020-03-23 LAB — COMPREHENSIVE METABOLIC PANEL
ALT: 19 U/L (ref 0–44)
AST: 20 U/L (ref 15–41)
Albumin: 4.1 g/dL (ref 3.5–5.0)
Alkaline Phosphatase: 65 U/L (ref 38–126)
Anion gap: 8 (ref 5–15)
BUN: 23 mg/dL — ABNORMAL HIGH (ref 6–20)
CO2: 23 mmol/L (ref 22–32)
Calcium: 9.1 mg/dL (ref 8.9–10.3)
Chloride: 108 mmol/L (ref 98–111)
Creatinine, Ser: 1.92 mg/dL — ABNORMAL HIGH (ref 0.61–1.24)
GFR calc Af Amer: 45 mL/min — ABNORMAL LOW (ref >60)
GFR calc non Af Amer: 39 mL/min — ABNORMAL LOW (ref >60)
Glucose, Bld: 120 mg/dL — ABNORMAL HIGH (ref 70–99)
Potassium: 4.3 mmol/L (ref 3.5–5.1)
Sodium: 139 mmol/L (ref 135–145)
Total Bilirubin: 1 mg/dL (ref 0.3–1.2)
Total Protein: 7.3 g/dL (ref 6.5–8.1)

## 2020-03-23 LAB — LIPASE, BLOOD: Lipase: 19 U/L (ref 11–51)

## 2020-03-23 IMAGING — CT CT HEAD W/O CM
4 of 7 series · 16 of 47 positions shown, 17 images · non-contrast
Comparison: [DATE]

CLINICAL DATA: Trauma, altered mental status

EXAM:
CT HEAD WITHOUT CONTRAST
CT CERVICAL SPINE WITHOUT CONTRAST
TECHNIQUE: Multidetector CT imaging of the head and cervical spine was
performed following the standard protocol without intravenous
contrast. Multiplanar CT image reconstructions of the cervical spine
were also generated.

[Series 2: head (person_name) (person_name) · axial · 0.41mm/px · z∈[+155,+205]mm · 2 of 32 slices shown, 3 images]
[im 11/32  brain]
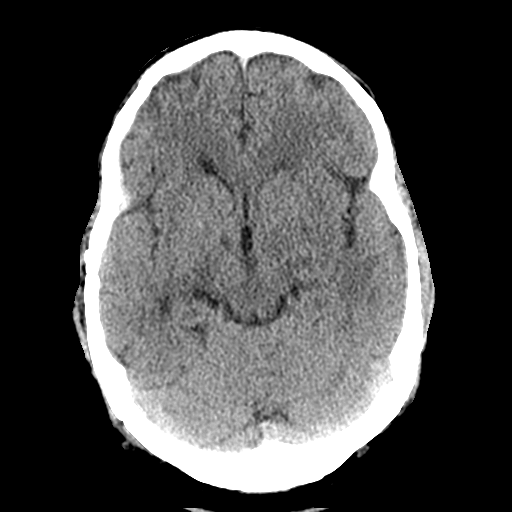
[im 11/32  bone]
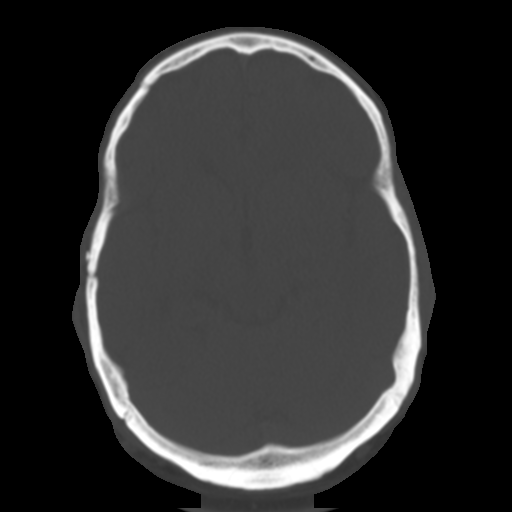
[im 21/32  brain]
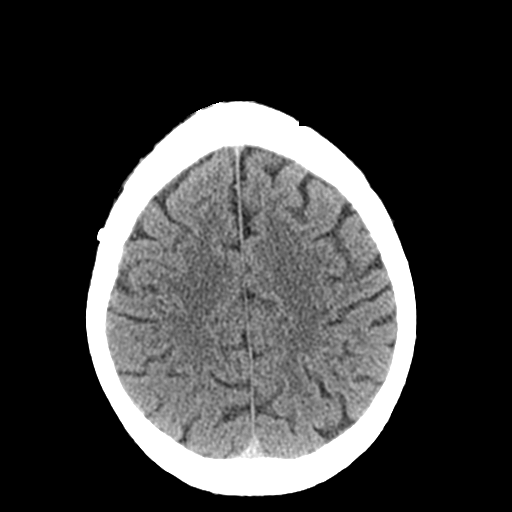

[Series 4: coronal soft tissue · coronal · 0.33mm/px · 3 of 70 slices shown]
[im 18/70  brain]
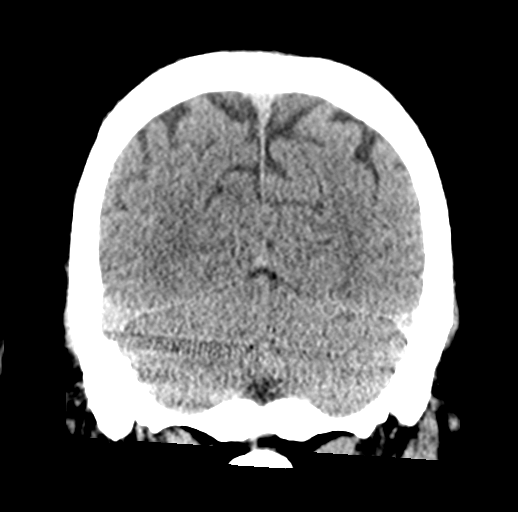
[im 35/70  brain]
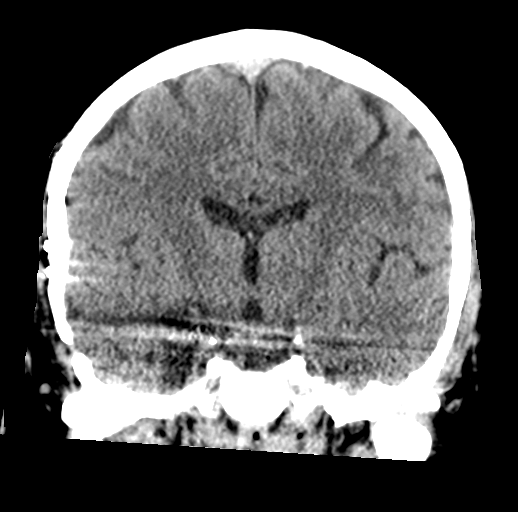
[im 52/70  brain]
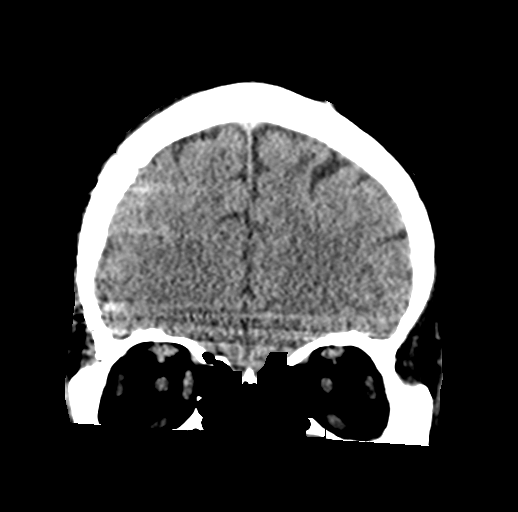

[Series 10: sagittal bone · sagittal · 0.33mm/px · 3 of 46 slices shown]
[im 16/46  brain]
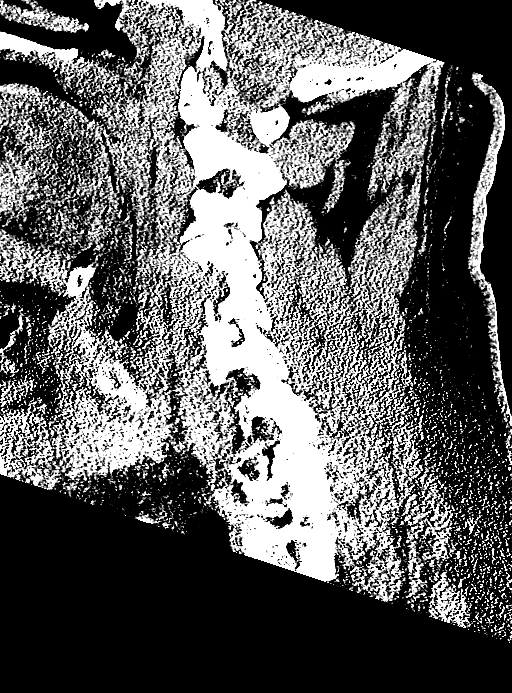
[im 23/46  brain]
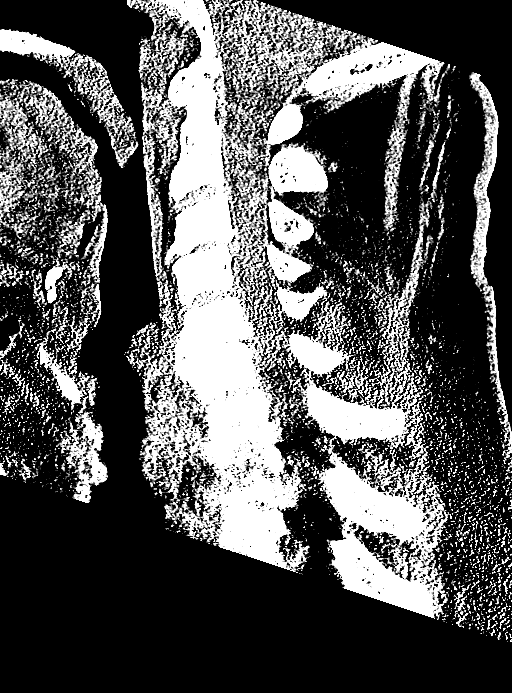
[im 31/46  brain]
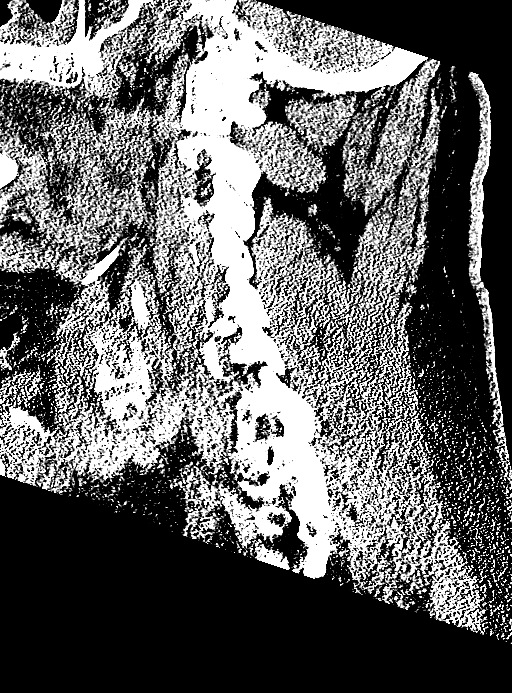

[Series 12: orthogonal bone · axial · 0.38mm/px · z∈[-120,+87]mm · 8 of 129 slices shown]
[im 10/129  bone]
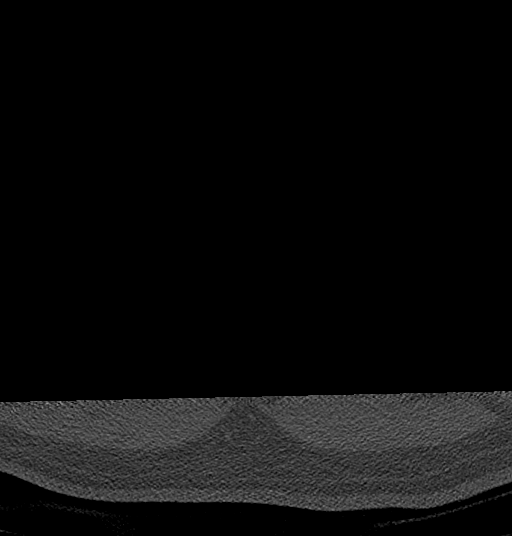
[im 28/129  bone]
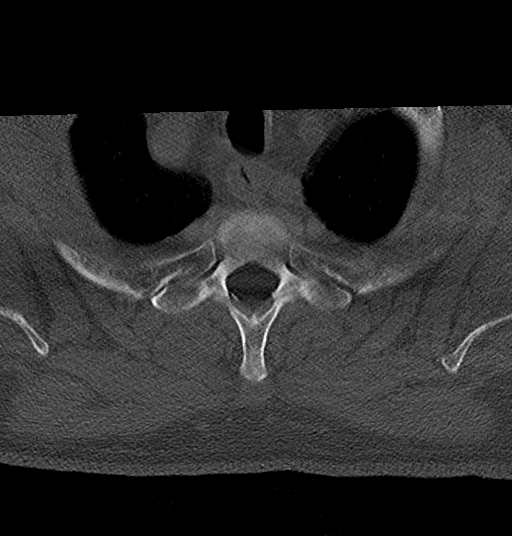
[im 46/129  bone]
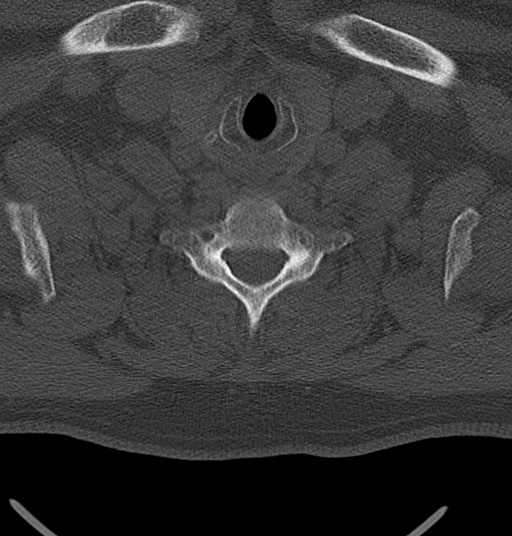
[im 55/129  bone]
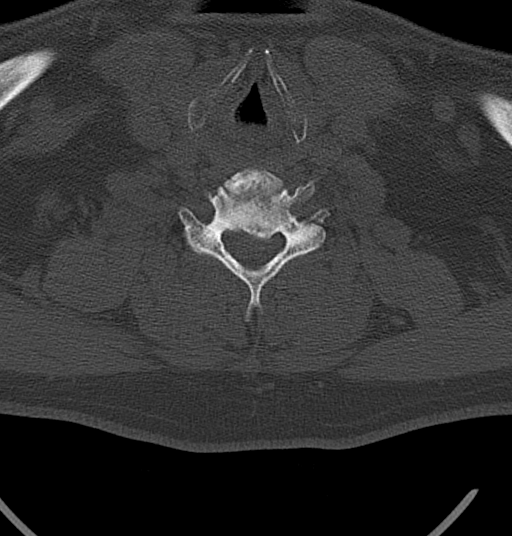
[im 74/129  bone]
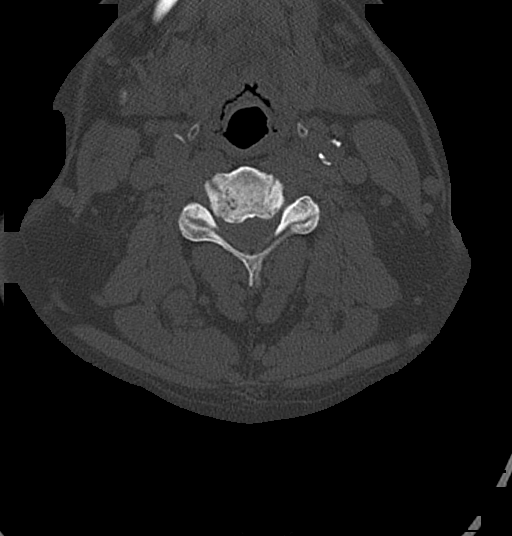
[im 83/129  bone]
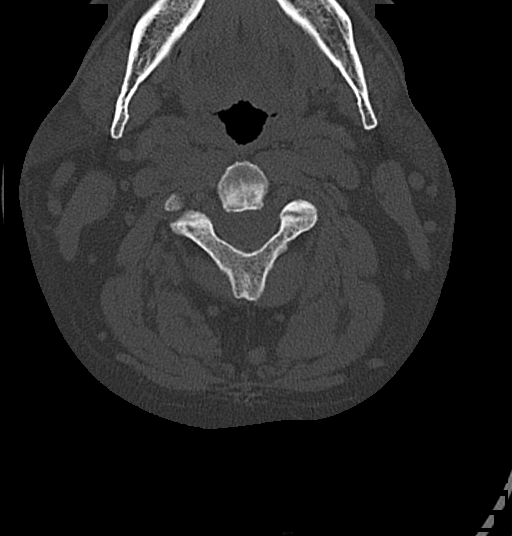
[im 101/129  bone]
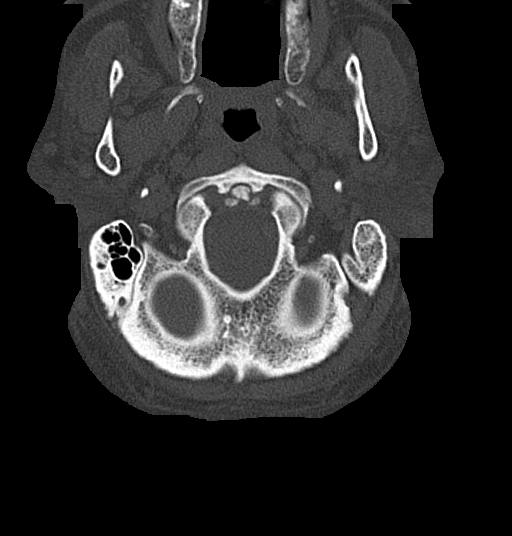
[im 119/129  bone]
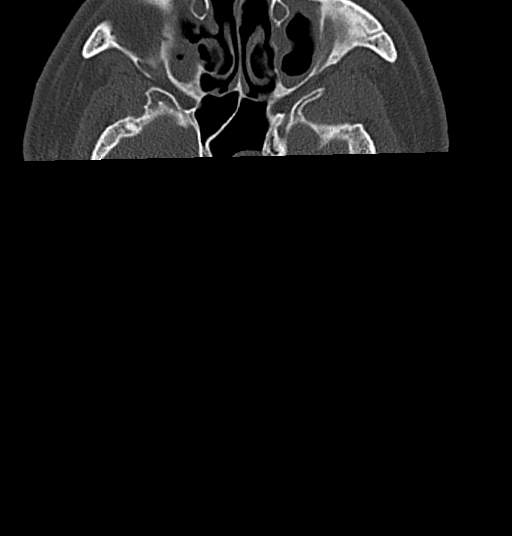

[16 of 47 positions shown; findings below may reference images not displayed]

FINDINGS: CT HEAD FINDINGS

Brain: No evidence of acute infarction, hemorrhage, hydrocephalus,
extra-axial collection or mass lesion/mass effect. Mild
periventricular white matter hypodensity. Redemonstrated aneurysm
clipping in the vicinity of the right carotid terminus with
encephalomalacia of the adjacent right temporal pole and a lacunar
infarction of the genu of the right internal capsule and anterior
thalamus.

Vascular: No hyperdense vessel or unexpected calcification.

Skull: Status post right pterional craniotomy. Negative for fracture
or focal lesion.

Sinuses/Orbits: No acute finding.

Other: None.

CT CERVICAL SPINE FINDINGS

Alignment: Degenerative straightening of the normal cervical
lordosis.

Skull base and vertebrae: No acute fracture. No primary bone lesion
or focal pathologic process.

Soft tissues and spinal canal: No prevertebral fluid or swelling. No
visible canal hematoma.

Disc levels: Moderate multilevel disc space height loss and
osteophytosis.

Upper chest: Negative.

Other: None.
IMPRESSION: 1.  No acute intracranial pathology.

2. Redemonstrated sequelae of right pterional craniotomy and
aneurysm clipping in the vicinity of the right carotid terminus.

3. No fracture or static subluxation of the cervical spine. Moderate
multilevel disc degenerative disease.

## 2020-03-23 IMAGING — CT CT CERVICAL SPINE W/O CM
3 of 8 series · 14 of 33 positions shown, 16 images · non-contrast
Comparison: [DATE]

CLINICAL DATA: Trauma, altered mental status

EXAM:
CT HEAD WITHOUT CONTRAST
CT CERVICAL SPINE WITHOUT CONTRAST
TECHNIQUE: Multidetector CT imaging of the head and cervical spine was
performed following the standard protocol without intravenous
contrast. Multiplanar CT image reconstructions of the cervical spine
were also generated.

[Series 8: thins st · axial · 0.35mm/px · z∈[-46,+80]mm · 6 of 353 slices shown]
[im 51/353  bone]
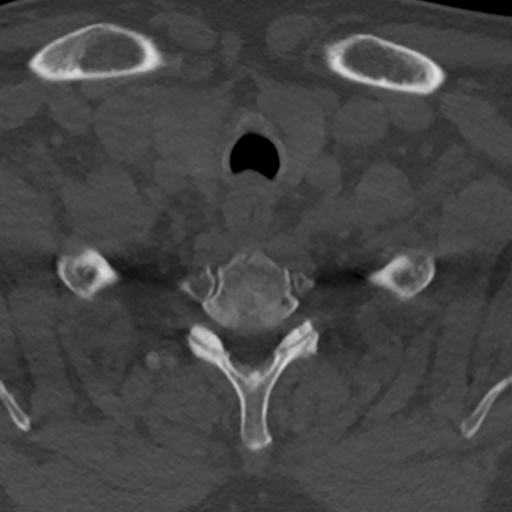
[im 101/353  bone]
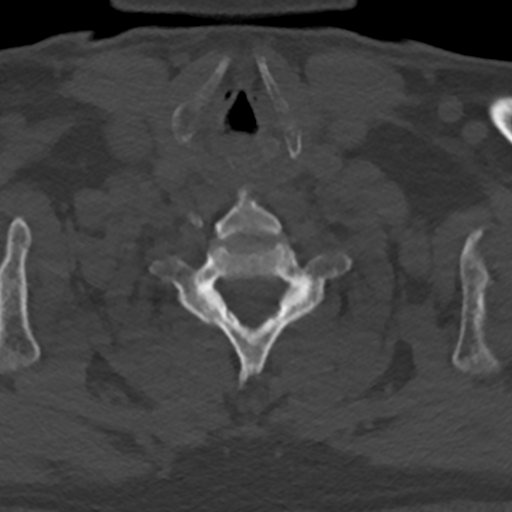
[im 151/353  bone]
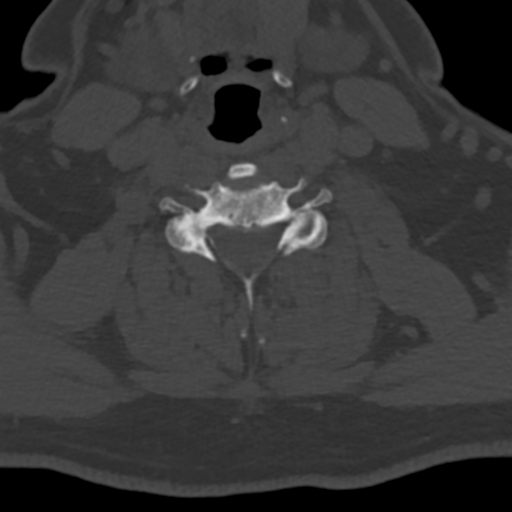
[im 202/353  bone]
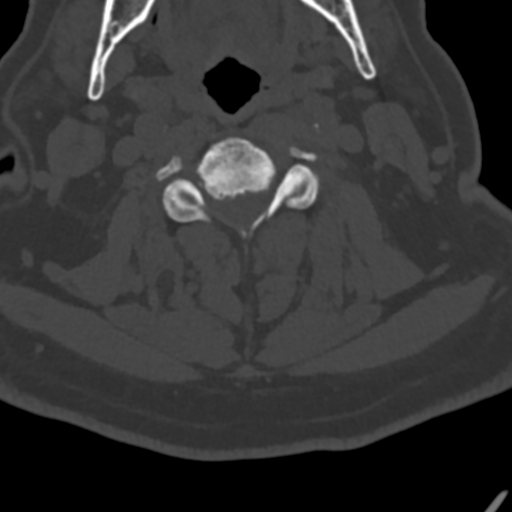
[im 252/353  bone]
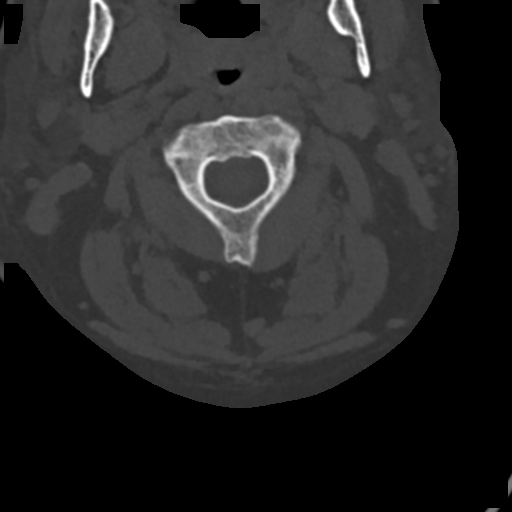
[im 302/353  bone]
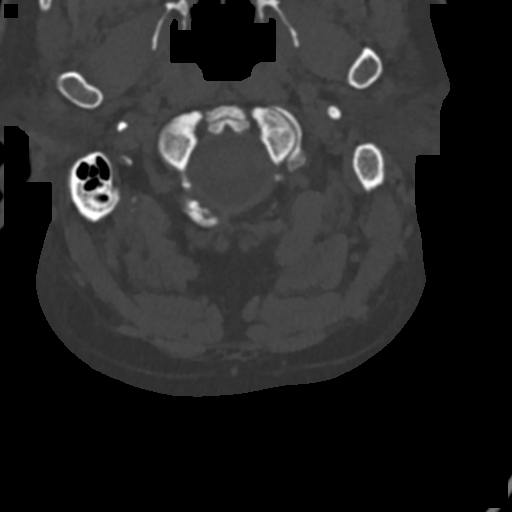

[Series 10: sagittal bone · sagittal · 0.33mm/px · 5 of 46 slices shown, 6 images]
[im 16/46  bone]
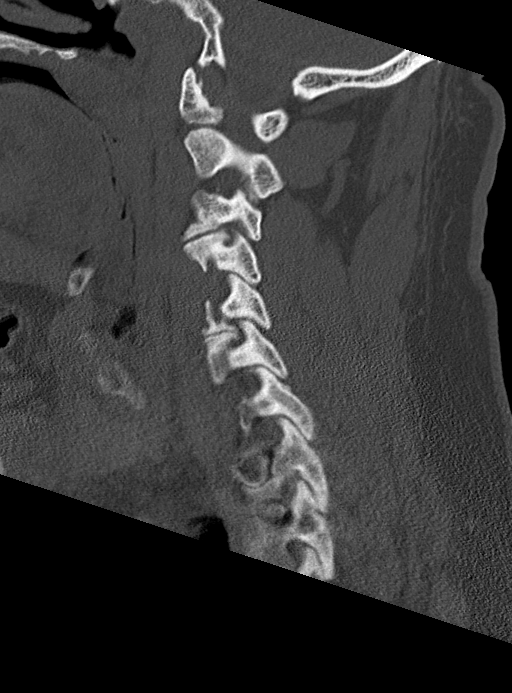
[im 19/46  bone]
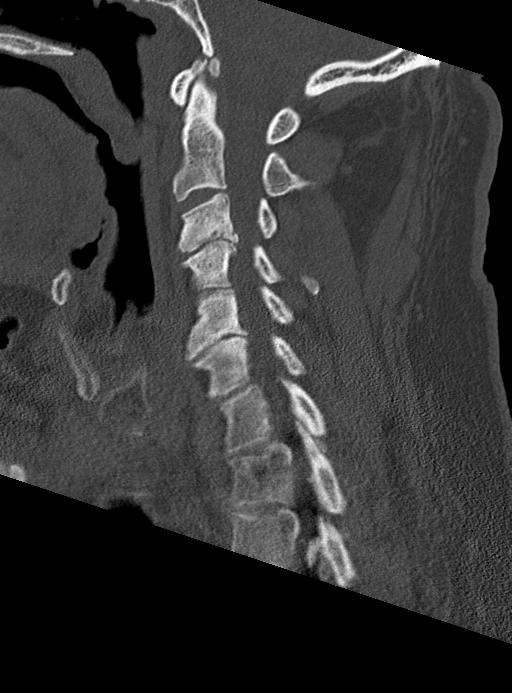
[im 23/46  soft-tissue]
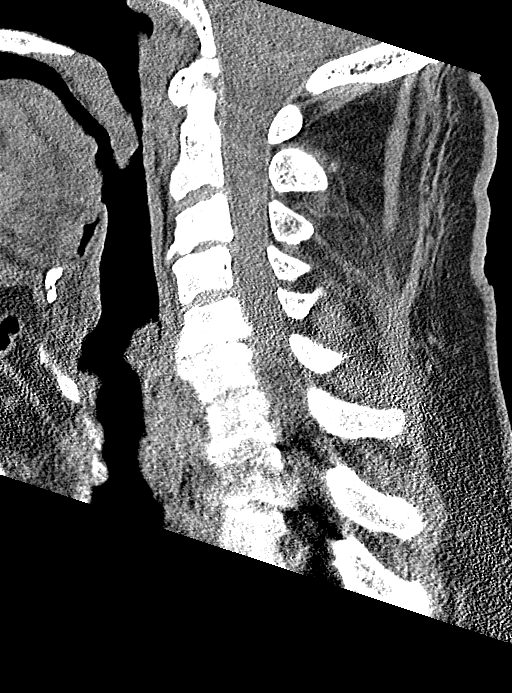
[im 23/46  bone]
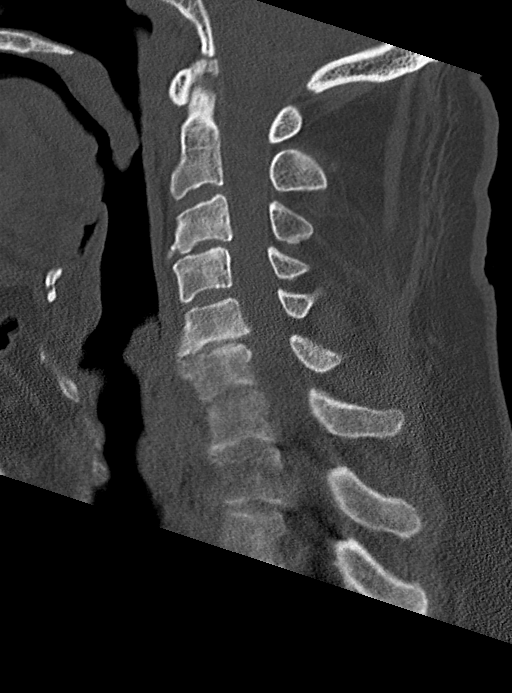
[im 27/46  bone]
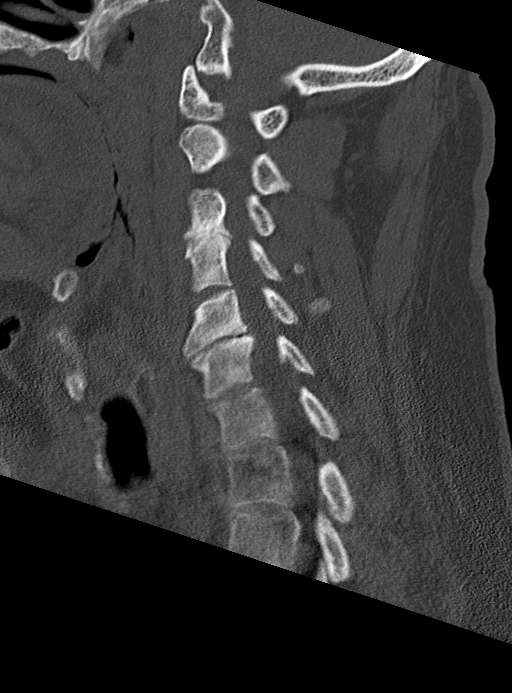
[im 31/46  bone]
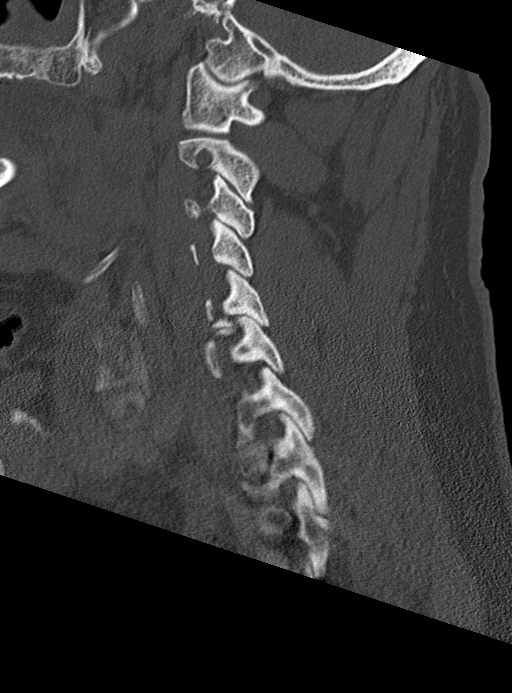

[Series 12: orthogonal bone · axial · 0.38mm/px · z∈[-137,+106]mm · 3 of 129 slices shown, 4 images]
[im 1/129  soft-tissue]
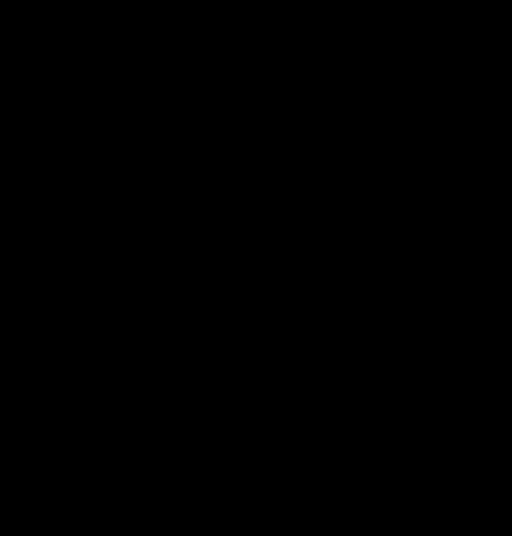
[im 1/129  bone]
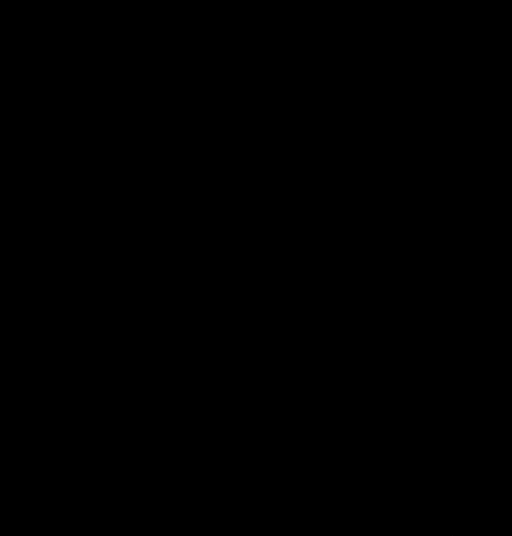
[im 65/129  bone]
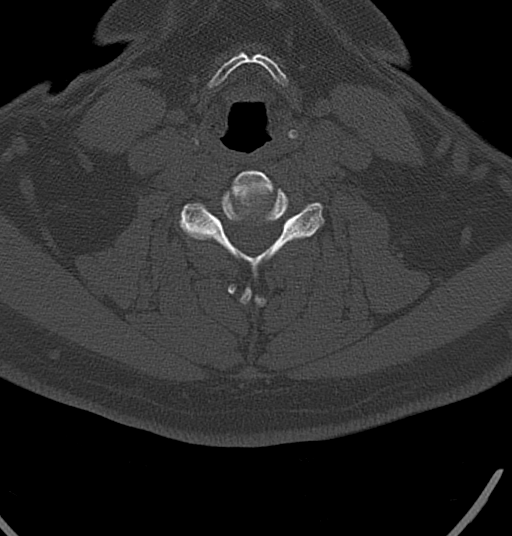
[im 129/129  bone]
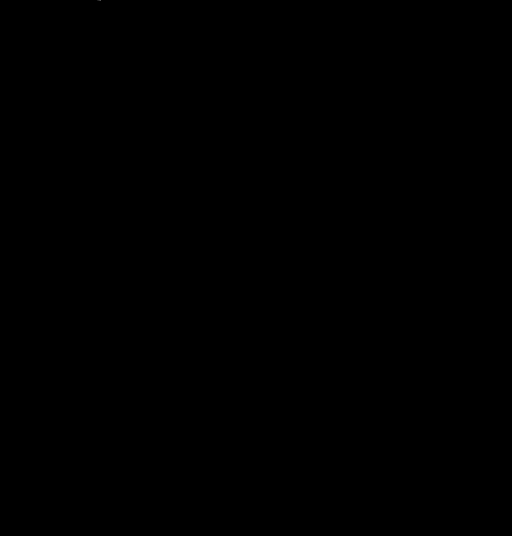

[14 of 33 positions shown; findings below may reference images not displayed]

FINDINGS: CT HEAD FINDINGS

Brain: No evidence of acute infarction, hemorrhage, hydrocephalus,
extra-axial collection or mass lesion/mass effect. Mild
periventricular white matter hypodensity. Redemonstrated aneurysm
clipping in the vicinity of the right carotid terminus with
encephalomalacia of the adjacent right temporal pole and a lacunar
infarction of the genu of the right internal capsule and anterior
thalamus.

Vascular: No hyperdense vessel or unexpected calcification.

Skull: Status post right pterional craniotomy. Negative for fracture
or focal lesion.

Sinuses/Orbits: No acute finding.

Other: None.

CT CERVICAL SPINE FINDINGS

Alignment: Degenerative straightening of the normal cervical
lordosis.

Skull base and vertebrae: No acute fracture. No primary bone lesion
or focal pathologic process.

Soft tissues and spinal canal: No prevertebral fluid or swelling. No
visible canal hematoma.

Disc levels: Moderate multilevel disc space height loss and
osteophytosis.

Upper chest: Negative.

Other: None.
IMPRESSION: 1.  No acute intracranial pathology.

2. Redemonstrated sequelae of right pterional craniotomy and
aneurysm clipping in the vicinity of the right carotid terminus.

3. No fracture or static subluxation of the cervical spine. Moderate
multilevel disc degenerative disease.

## 2020-03-23 IMAGING — CR DG CHEST 2V
1 series · 2 of 2 positions shown · non-contrast
Comparison: [DATE]

CLINICAL DATA: Shortness of breath, cough

EXAM:
CHEST - 2 VIEW

[Series 1: dg chest 2 view · 0.14mm/px · 2 of 2 slices shown]
[im 1/2]
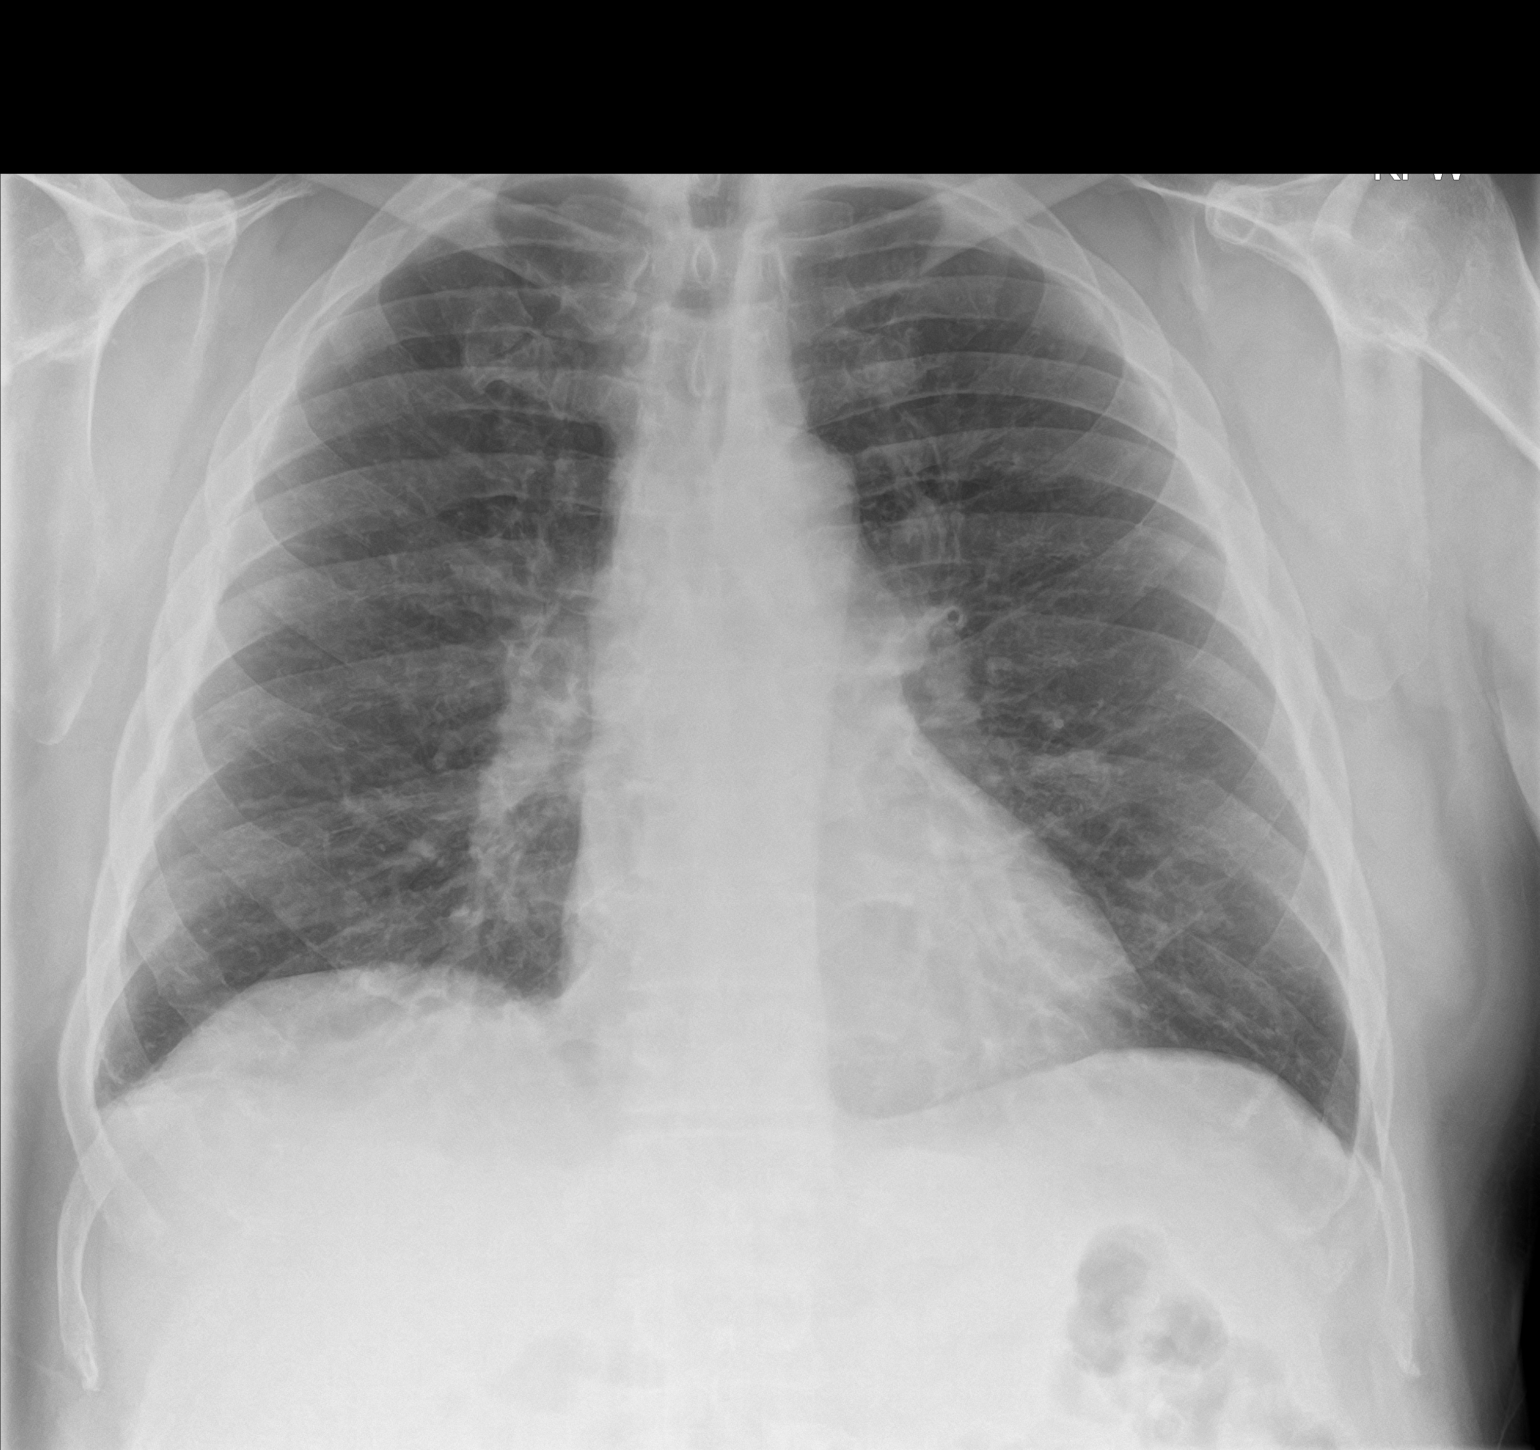
[im 2/2]
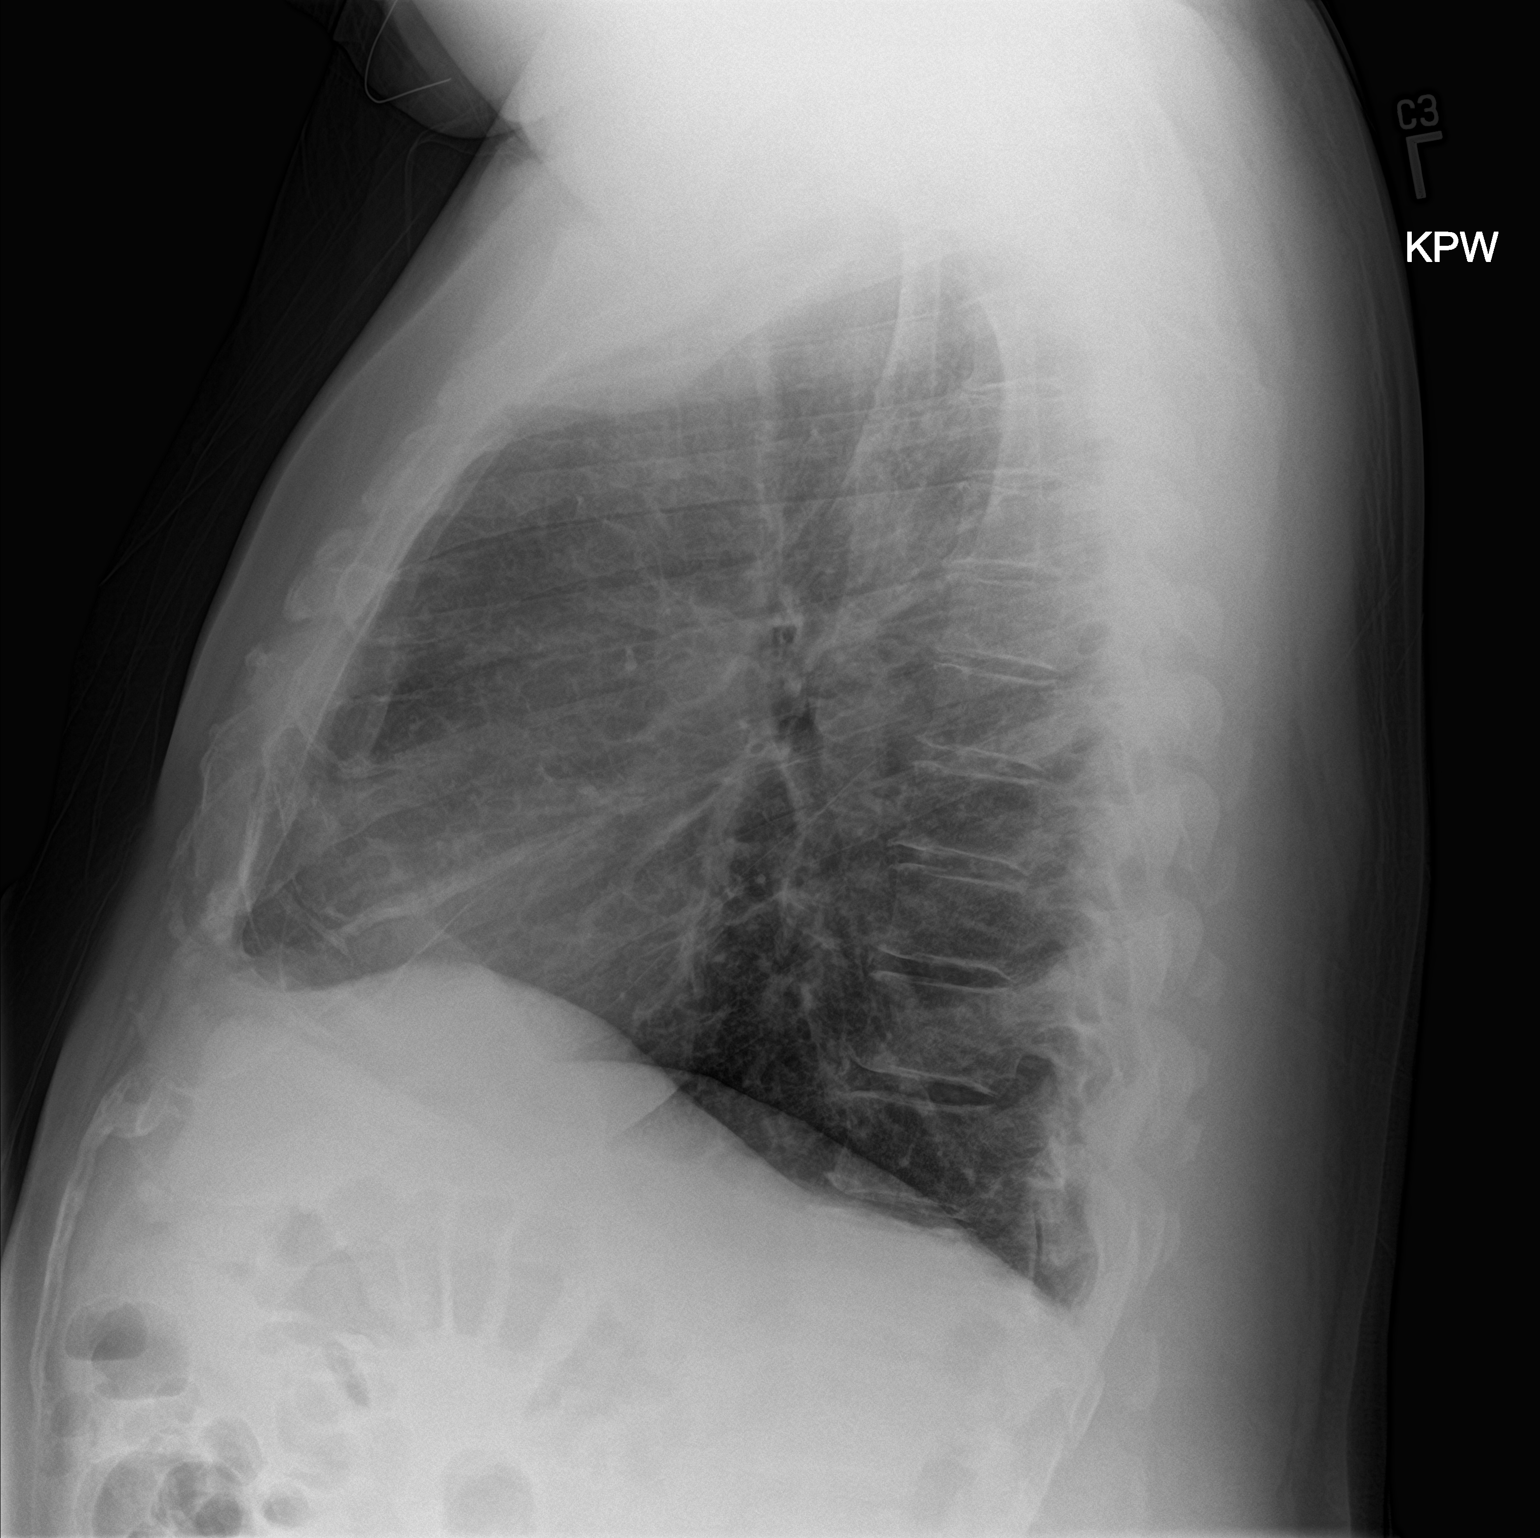

[2 of 2 positions shown; findings below may reference images not displayed]

FINDINGS: The heart size and mediastinal contours are within normal limits.
Both lungs are clear. The visualized skeletal structures are
unremarkable.
IMPRESSION: No acute abnormality of the lungs.

## 2020-03-23 NOTE — ED Notes (Signed)
Pt wants to leave.  With family. NAD. Will return inf worse.

## 2020-03-23 NOTE — ED Triage Notes (Signed)
Pt has had diarrhea, SHOB, cough, confusion, and itching for 1 week.  Pain to lower abdomen that is chronic pain.  Sister reports his body will start jerking.  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  He also goes to pain clinic and concern of possible medication causing this because they have increased meds.  His nucynta was doubled recently.  Other sister dx with covid yesterday.  Pt fell Sunday at 1 am.  Per sister pt has been c/o neck pain.  Pt had rapid negative covid at urgent care but was sent here.

## 2020-03-23 NOTE — ED Triage Notes (Signed)
First Nurse Note:  Arrives with sister for ED evaluation of worsening confusion, fall on Saturday.  Patient lives with sisters, one of which has recently tested positive for COVID.

## 2020-03-24 ENCOUNTER — Telehealth: Payer: Self-pay | Admitting: Emergency Medicine

## 2020-03-24 NOTE — Telephone Encounter (Signed)
Called patient due to lwot to inquire about condition and follow up plans. Spoke with sister who lives with him.  Says he is still acting strange.  She says he has been on new pain med from pain clinic and dose increased this week.   Told her tocall and ask them if this is possible side effect of medication.I told her they should have access to his results and they may ask him to come back to ED.Told her he can always return here.

## 2020-03-28 ENCOUNTER — Telehealth: Payer: Self-pay | Admitting: Gastroenterology

## 2020-03-28 NOTE — Telephone Encounter (Signed)
[name] called to cx patient's colonoscopy scheduled for 04-06-2020 they have been exposed to Covid. Please call patient to r/s procedure.

## 2020-03-28 NOTE — Telephone Encounter (Signed)
Moved patient to 04/20/2020

## 2020-03-28 NOTE — Telephone Encounter (Signed)
Called and left a message for call back  

## 2020-03-29 ENCOUNTER — Other Ambulatory Visit: Payer: Self-pay | Admitting: Pharmacy Technician

## 2020-03-29 NOTE — Patient Outreach (Signed)
[facility]) Care Management  03/29/2020  [place]. 12-Mar-1966 [id number]    Successful outreach call placed to  patient regarding patient assistance medication delivery of Stiolto from [facility], HIPAA identifiers verified.   Spoke to patient's sister Derek Cooley, HIPAA identifiers verified.  Derek Cooley confirmed that patient received 3 Stiolto inhalers in the mail. Discussed refill procedure with Derek Cooley which requires someone to call in the request to [facility]. Informed where to find the phone number on the prescription label. Derek Cooley verified understanding.  Derek Cooley had questions about the patient's medication list and when to take certain medications. She informed that the provider at the pain clinic informed that some medications could be spaced apart and be taken at different times of the day Informed Derek Cooley that I would outreach embedded [place] to outreach her to discuss the timing of medications. Derek Cooley was agreeable to this plan. Confirmed Derek Cooley had name and number as she had no other questions or concerns.  Follow up:  Will route note to embedded Kim for case closure and to inquire if she could outreach patient on medication management issue. Will also remove myself from care team as patient assistance is completed.  Derek Cooley, [place]  [phone]

## 2020-03-30 ENCOUNTER — Telehealth: Payer: Self-pay

## 2020-04-01 ENCOUNTER — Telehealth: Payer: Self-pay

## 2020-04-02 ENCOUNTER — Other Ambulatory Visit: Payer: Self-pay | Admitting: Family Medicine

## 2020-04-02 NOTE — Telephone Encounter (Signed)
Requested Prescriptions  Pending Prescriptions Disp Refills  . lisinopril (ZESTRIL) 20 MG tablet [Pharmacy Med Name: LISINOPRIL 20 MG TAB] 90 tablet 1    Sig: TAKE 1 TABLET BY MOUTH ONCE DAILY     Cardiovascular:  ACE Inhibitors Failed - 04/02/2020 11:41 AM      Failed - Cr in normal range and within 180 days    Creatinine  Date Value Ref Range Status  08/12/2014 1.55 (H) 0.60 - 1.30 mg/dL Final   Creatinine, Ser  Date Value Ref Range Status  03/23/2020 1.92 (H) 0.61 - 1.24 mg/dL Final         Failed - Last BP in normal range    BP Readings from Last 1 Encounters:  03/23/20 (!) 153/92         Passed - K in normal range and within 180 days    Potassium  Date Value Ref Range Status  03/23/2020 4.3 3.5 - 5.1 mmol/L Final  08/12/2014 3.9 3.5 - 5.1 mmol/L Final         Passed - Patient is not pregnant      Passed - Valid encounter within last 6 months    Recent Outpatient Visits          1 month ago Somnolence   [facility] [place], [place]   2 months ago Oral pain   [place], [place], [place]   3 months ago Hypertension associated with diabetes [facility])   [place], [place], [place]   4 months ago Chronic obstructive pulmonary disease with acute exacerbation [facility])   [place], [place], [place]   6 months ago Acute bilateral low back pain with bilateral sciatica   [facility], [place], [place]      Future Appointments            In 4 days [place], [place], [facility]           '

## 2020-04-06 ENCOUNTER — Ambulatory Visit: Payer: Medicare HMO | Admitting: Family Medicine

## 2020-04-06 ENCOUNTER — Ambulatory Visit: Payer: Self-pay

## 2020-04-13 ENCOUNTER — Telehealth: Payer: Self-pay

## 2020-04-18 ENCOUNTER — Telehealth: Payer: Self-pay | Admitting: Gastroenterology

## 2020-04-18 ENCOUNTER — Other Ambulatory Visit: Admission: RE | Admit: 2020-04-18 | Payer: Medicare HMO | Source: Ambulatory Visit

## 2020-04-18 NOTE — Telephone Encounter (Signed)
Trish at Endo states pt sister Joseph Art called and states she can not bring pt for procedure this Wed 5.12.21. Pt sister would like a call back to resch. Please call at [phone].

## 2020-04-18 NOTE — Telephone Encounter (Signed)
Patient sister stated that she is out of state right now because her grandson got sick. Moved patient to 05/18/2020 with Dr. Marius Ditch

## 2020-05-12 ENCOUNTER — Other Ambulatory Visit: Payer: Self-pay | Admitting: Family Medicine

## 2020-05-12 NOTE — Telephone Encounter (Signed)
Requested Prescriptions  Pending Prescriptions Disp Refills  . fenofibrate micronized (LOFIBRA) 67 MG capsule [Pharmacy Med Name: FENOFIBRATE MICRONIZED 67 MG CAP] 90 capsule 0    Sig: TAKE 1 CAPSULE EVERY DAY BEFORE BREAKFAST     Cardiovascular:  Antilipid - Fibric Acid Derivatives Failed - 05/12/2020  4:48 PM      Failed - Total Cholesterol in normal range and within 360 days    Cholesterol, Total  Date Value Ref Range Status  09/09/2019 202 (H) 100 - 199 mg/dL Final         Failed - LDL in normal range and within 360 days    LDL Chol Calc (NIH)  Date Value Ref Range Status  09/09/2019 102 (H) 0 - 99 mg/dL Final         Failed - HDL in normal range and within 360 days    HDL  Date Value Ref Range Status  09/09/2019 30 (L) >39 mg/dL Final         Failed - Triglycerides in normal range and within 360 days    Triglycerides  Date Value Ref Range Status  09/09/2019 415 (H) 0 - 149 mg/dL Final         Failed - Cr in normal range and within 180 days    Creatinine  Date Value Ref Range Status  08/12/2014 1.55 (H) 0.60 - 1.30 mg/dL Final   Creatinine, Ser  Date Value Ref Range Status  03/23/2020 1.92 (H) 0.61 - 1.24 mg/dL Final         Failed - eGFR in normal range and within 180 days    EGFR (African American)  Date Value Ref Range Status  08/12/2014 >60  Final   GFR calc Af Amer  Date Value Ref Range Status  03/23/2020 45 (L) >60 mL/min Final   EGFR (Non-African Amer.)  Date Value Ref Range Status  08/12/2014 52 (L)  Final    Comment:    eGFR values <2m/min/1.73 m2 may be an indication of chronic kidney disease (CKD). Calculated eGFR is useful in patients with stable renal function. The eGFR calculation will not be reliable in acutely ill patients when serum creatinine is changing rapidly. It is not useful in  patients on dialysis. The eGFR calculation may not be applicable to patients at the low and high extremes of body sizes, pregnant women, and  vegetarians.    GFR calc non Af Amer  Date Value Ref Range Status  03/23/2020 39 (L) >60 mL/min Final         Passed - ALT in normal range and within 180 days    ALT  Date Value Ref Range Status  03/23/2020 19 0 - 44 U/L Final   SGPT (ALT)  Date Value Ref Range Status  08/12/2014 42 U/L Final    Comment:    14-63 NOTE: New Reference Range 06/29/14          Passed - AST in normal range and within 180 days    AST  Date Value Ref Range Status  03/23/2020 20 15 - 41 U/L Final   SGOT(AST)  Date Value Ref Range Status  08/12/2014 24 15 - 37 Unit/L Final         Passed - Valid encounter within last 12 months    Recent Outpatient Visits          [place] months ago [place]  3 months ago Oral pain  [place], [place], [place]   4 months ago Hypertension associated with diabetes [facility])   [place], [place], [place]   5 months ago Chronic obstructive pulmonary disease with acute exacerbation [facility])   [place], [place]   8 months ago Acute bilateral low back pain with bilateral sciatica   [facility] [place], [place]

## 2020-05-16 ENCOUNTER — Other Ambulatory Visit
Admission: RE | Admit: 2020-05-16 | Discharge: 2020-05-16 | Disposition: A | Discharge status: Home / Self Care | Payer: Medicare HMO | Source: Ambulatory Visit | Attending: Gastroenterology | Admitting: Gastroenterology

## 2020-05-16 ENCOUNTER — Other Ambulatory Visit: Payer: Self-pay

## 2020-05-16 DIAGNOSIS — Z20822 Contact with and (suspected) exposure to covid-19: Secondary | ICD-10-CM | POA: Diagnosis not present

## 2020-05-16 DIAGNOSIS — Z01812 Encounter for preprocedural laboratory examination: Secondary | ICD-10-CM | POA: Insufficient documentation

## 2020-05-16 LAB — SARS CORONAVIRUS 2 (TAT 6-24 HRS): SARS Coronavirus 2: NEGATIVE

## 2020-05-18 ENCOUNTER — Encounter
Admission: RE | Disposition: A | Discharge status: Home / Self Care | Payer: Self-pay | Source: Home / Self Care | Attending: Gastroenterology

## 2020-05-18 ENCOUNTER — Ambulatory Visit
Admission: RE | Admit: 2020-05-18 | Discharge: 2020-05-18 | Disposition: A | Discharge status: Home / Self Care | Payer: Medicare HMO | Attending: Gastroenterology | Admitting: Gastroenterology

## 2020-05-18 ENCOUNTER — Ambulatory Visit: Payer: Medicare HMO | Admitting: Anesthesiology

## 2020-05-18 DIAGNOSIS — K219 Gastro-esophageal reflux disease without esophagitis: Secondary | ICD-10-CM | POA: Diagnosis not present

## 2020-05-18 DIAGNOSIS — F410 Panic disorder [episodic paroxysmal anxiety] without agoraphobia: Secondary | ICD-10-CM | POA: Insufficient documentation

## 2020-05-18 DIAGNOSIS — Z8614 Personal history of Methicillin resistant Staphylococcus aureus infection: Secondary | ICD-10-CM | POA: Diagnosis not present

## 2020-05-18 DIAGNOSIS — R519 Headache, unspecified: Secondary | ICD-10-CM | POA: Diagnosis not present

## 2020-05-18 DIAGNOSIS — K644 Residual hemorrhoidal skin tags: Secondary | ICD-10-CM | POA: Insufficient documentation

## 2020-05-18 DIAGNOSIS — F419 Anxiety disorder, unspecified: Secondary | ICD-10-CM | POA: Insufficient documentation

## 2020-05-18 DIAGNOSIS — Z888 Allergy status to other drugs, medicaments and biological substances status: Secondary | ICD-10-CM | POA: Insufficient documentation

## 2020-05-18 DIAGNOSIS — Z980 Intestinal bypass and anastomosis status: Secondary | ICD-10-CM | POA: Diagnosis not present

## 2020-05-18 DIAGNOSIS — Z825 Family history of asthma and other chronic lower respiratory diseases: Secondary | ICD-10-CM | POA: Diagnosis not present

## 2020-05-18 DIAGNOSIS — Z7984 Long term (current) use of oral hypoglycemic drugs: Secondary | ICD-10-CM | POA: Insufficient documentation

## 2020-05-18 DIAGNOSIS — Z818 Family history of other mental and behavioral disorders: Secondary | ICD-10-CM | POA: Insufficient documentation

## 2020-05-18 DIAGNOSIS — Z1211 Encounter for screening for malignant neoplasm of colon: Secondary | ICD-10-CM | POA: Insufficient documentation

## 2020-05-18 DIAGNOSIS — J449 Chronic obstructive pulmonary disease, unspecified: Secondary | ICD-10-CM | POA: Insufficient documentation

## 2020-05-18 DIAGNOSIS — Z841 Family history of disorders of kidney and ureter: Secondary | ICD-10-CM | POA: Insufficient documentation

## 2020-05-18 DIAGNOSIS — G3184 Mild cognitive impairment, so stated: Secondary | ICD-10-CM | POA: Insufficient documentation

## 2020-05-18 DIAGNOSIS — Z6841 Body Mass Index (BMI) 40.0 and over, adult: Secondary | ICD-10-CM | POA: Diagnosis not present

## 2020-05-18 DIAGNOSIS — Z881 Allergy status to other antibiotic agents status: Secondary | ICD-10-CM | POA: Insufficient documentation

## 2020-05-18 DIAGNOSIS — Z8261 Family history of arthritis: Secondary | ICD-10-CM | POA: Diagnosis not present

## 2020-05-18 DIAGNOSIS — F329 Major depressive disorder, single episode, unspecified: Secondary | ICD-10-CM | POA: Insufficient documentation

## 2020-05-18 DIAGNOSIS — F17210 Nicotine dependence, cigarettes, uncomplicated: Secondary | ICD-10-CM | POA: Insufficient documentation

## 2020-05-18 DIAGNOSIS — E119 Type 2 diabetes mellitus without complications: Secondary | ICD-10-CM | POA: Insufficient documentation

## 2020-05-18 DIAGNOSIS — Z885 Allergy status to narcotic agent status: Secondary | ICD-10-CM | POA: Insufficient documentation

## 2020-05-18 DIAGNOSIS — Z79899 Other long term (current) drug therapy: Secondary | ICD-10-CM | POA: Diagnosis not present

## 2020-05-18 DIAGNOSIS — I10 Essential (primary) hypertension: Secondary | ICD-10-CM | POA: Insufficient documentation

## 2020-05-18 DIAGNOSIS — G4730 Sleep apnea, unspecified: Secondary | ICD-10-CM | POA: Diagnosis not present

## 2020-05-18 DIAGNOSIS — E669 Obesity, unspecified: Secondary | ICD-10-CM | POA: Insufficient documentation

## 2020-05-18 DIAGNOSIS — Z833 Family history of diabetes mellitus: Secondary | ICD-10-CM | POA: Insufficient documentation

## 2020-05-18 DIAGNOSIS — Z8249 Family history of ischemic heart disease and other diseases of the circulatory system: Secondary | ICD-10-CM | POA: Insufficient documentation

## 2020-05-18 HISTORY — PX: COLONOSCOPY WITH PROPOFOL: SHX5780

## 2020-05-18 LAB — GLUCOSE, CAPILLARY: Glucose-Capillary: 201 mg/dL — ABNORMAL HIGH (ref 70–99)

## 2020-05-18 SURGERY — COLONOSCOPY WITH PROPOFOL
Anesthesia: General

## 2020-05-18 MED ORDER — SODIUM CHLORIDE 0.9 % IV SOLN
INTRAVENOUS | Status: DC
  Administered 2020-05-18: 1000 mL via INTRAVENOUS

## 2020-05-18 MED ORDER — PROPOFOL 10 MG/ML IV BOLUS
INTRAVENOUS | Status: DC | PRN
  Administered 2020-05-18: 80 mg via INTRAVENOUS
  Administered 2020-05-18: 20 mg via INTRAVENOUS

## 2020-05-18 MED ORDER — PROPOFOL 500 MG/50ML IV EMUL
INTRAVENOUS | Status: AC
  Filled 2020-05-18: qty 50

## 2020-05-18 MED ORDER — LIDOCAINE 2% (20 MG/ML) 5 ML SYRINGE
INTRAMUSCULAR | Status: DC | PRN
  Administered 2020-05-18: 50 mg via INTRAVENOUS

## 2020-05-18 MED ORDER — PROPOFOL 500 MG/50ML IV EMUL
INTRAVENOUS | Status: DC | PRN
  Administered 2020-05-18: 150 ug/kg/min via INTRAVENOUS

## 2020-05-18 MED ORDER — LIDOCAINE HCL (PF) 2 % IJ SOLN
INTRAMUSCULAR | Status: AC
  Filled 2020-05-18: qty 10

## 2020-05-18 NOTE — Addendum Note (Signed)
Addendum  created 05/18/20 1019 by Aline Brochure, CRNA   Charge Capture section accepted

## 2020-05-18 NOTE — Anesthesia Preprocedure Evaluation (Signed)
Anesthesia Evaluation  Patient identified by MRN, date of birth, ID band Patient awake    Reviewed: Allergy & Precautions, NPO status , Patient's Chart, lab work & pertinent test results  History of Anesthesia Complications Negative for: history of anesthetic complications  Airway Mallampati: II  TM Distance: >3 FB Neck ROM: Full    Dental  (+) Edentulous Upper, Edentulous Lower   Pulmonary asthma , sleep apnea , COPD,  COPD inhaler, Current SmokerPatient did not abstain from smoking.,    breath sounds clear to auscultation- rhonchi (-) wheezing      Cardiovascular hypertension, Pt. on medications (-) CAD, (-) Past MI, (-) Cardiac Stents and (-) CABG  Rhythm:Regular Rate:Normal - Systolic murmurs and - Diastolic murmurs    Neuro/Psych  Headaches, neg Seizures PSYCHIATRIC DISORDERS Anxiety Depression Hx of aneurysm, s/p coiling    GI/Hepatic Neg liver ROS, GERD  ,  Endo/Other  diabetes, Oral Hypoglycemic Agents  Renal/GU CRFRenal disease     Musculoskeletal negative musculoskeletal ROS (+)   Abdominal (+) + obese,   Peds  Hematology negative hematology ROS (+)   Anesthesia Other Findings Past Medical History: No date: Abdominal abscess No date: Acute pancreatitis No date: Anxiety No date: Asthma No date: Back pain No date: Depression No date: Diabetes mellitus without complication (HCC) No date: Emphysema of lung (HCC)     Comment:  right sided No date: GERD (gastroesophageal reflux disease) 01/26/2010: 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) No date: Hypertension No date: Mild cognitive impairment No date: MRSA (methicillin resistant Staphylococcus aureus) No date: Obesity No date: Panic disorder 2007: Perforated bowel ([place])     Comment:  Tempoary Colostomy Bag, Skin Graft for Abd wound No date: Sleep apnea     Comment:  sleep study 2013 2011: Subarachnoid hemorrhage ([place])     Comment:   coil placed 04/17/2016: Type 2 diabetes mellitus without complication, without long- term current use of insulin ([place]) 03/27/2011: Vitreous hemorrhage ([place])     Comment:  Overview:  Bilateral; 01/2010, from [facility]    Reproductive/Obstetrics                             Anesthesia Physical Anesthesia Plan  ASA: III  Anesthesia Plan: General   Post-op Pain Management:    Induction: Intravenous  PONV Risk Score and Plan: 0 and Propofol infusion  Airway Management Planned: Natural Airway  Additional Equipment:   Intra-op Plan:   Post-operative Plan:   Informed Consent: I have reviewed the patients History and Physical, chart, labs and discussed the procedure including the risks, benefits and alternatives for the proposed anesthesia with the patient or authorized representative who has indicated his/her understanding and acceptance.     Dental advisory given  Plan Discussed with: CRNA and Anesthesiologist  Anesthesia Plan Comments:         Anesthesia Quick Evaluation

## 2020-05-18 NOTE — Transfer of Care (Signed)
Immediate Anesthesia Transfer of Care Note  Patient: Derek Cooley.  Procedure(s) Performed: COLONOSCOPY WITH PROPOFOL (N/A )  Patient Location: PACU  Anesthesia Type:General  Level of Consciousness: awake, alert  and oriented  Airway & Oxygen Therapy: Patient Spontanous Breathing  Post-op Assessment: Post -op Vital signs reviewed and stable  Post vital signs: stable  Last Vitals:  Vitals Value Taken Time  BP 141/93 05/18/20 0854  Temp 36.4 C 05/18/20 0853  Pulse 94 05/18/20 0854  Resp 17 05/18/20 0853  SpO2 95 % 05/18/20 0854  Vitals shown include unvalidated device data.  Last Pain:  Vitals:   05/18/20 0853  TempSrc: Temporal  PainSc:          Complications: No apparent anesthesia complications

## 2020-05-18 NOTE — Op Note (Signed)
[facility] Patient Name: Derek Cooley Procedure Date: 05/18/2020 8:35 AM MRN: [id number] Account #: [id number] Date of Birth: [birth year] Admit Type: Outpatient Age: 54 Room: [facility] ENDO ROOM 4 Gender: Male Note Status: Finalized Procedure:             Colonoscopy Indications:           Screening for colorectal malignant neoplasm, Last                         colonoscopy: February 2005 Providers:             Lin Landsman MD, MD Medicines:             Monitored Anesthesia Care Complications:         No immediate complications. Estimated blood loss: None. Procedure:             Pre-Anesthesia Assessment:                        - Prior to the procedure, a History and Physical was                         performed, and patient medications and allergies were                         reviewed. The patient is competent. The risks and                         benefits of the procedure and the sedation options and                         risks were discussed with the patient. All questions                         were answered and informed consent was obtained.                         Patient identification and proposed procedure were                         verified by the physician, the nurse, the                         anesthesiologist, the anesthetist and the technician                         in the pre-procedure area in the procedure room in the                         endoscopy suite. Mental Status Examination: alert and                         oriented. Airway Examination: normal oropharyngeal                         airway and neck mobility. Respiratory Examination:                         clear to auscultation. CV Examination: normal.  Prophylactic Antibiotics: The patient does not require                         prophylactic antibiotics. Prior Anticoagulants: The                         patient has taken no previous  anticoagulant or                         antiplatelet agents. ASA Grade Assessment: III - A                         patient with severe systemic disease. After reviewing                         the risks and benefits, the patient was deemed in                         satisfactory condition to undergo the procedure. The                         anesthesia plan was to use monitored anesthesia care                         (MAC). Immediately prior to administration of                         medications, the patient was re-assessed for adequacy                         to receive sedatives. The heart rate, respiratory                         rate, oxygen saturations, blood pressure, adequacy of                         pulmonary ventilation, and response to care were                         monitored throughout the procedure. The physical                         status of the patient was re-assessed after the                         procedure.                        After obtaining informed consent, the colonoscope was                         passed under direct vision. Throughout the procedure,                         the patient's blood pressure, pulse, and oxygen                         saturations were monitored continuously. The  Colonoscope was introduced through the anus and                         advanced to the the cecum, identified by appendiceal                         orifice and ileocecal valve. The colonoscopy was                         performed without difficulty. The patient tolerated                         the procedure well. The quality of the bowel                         preparation was evaluated using the BBPS [facility] Bowel                         Preparation Scale) with scores of: Right Colon = 3,                         Transverse Colon = 3 and Left Colon = 3 (entire mucosa                         seen well with no residual staining, small  fragments                         of stool or opaque liquid). The total BBPS score                         equals 9. Findings:      The perianal and digital rectal examinations were normal. Pertinent       negatives include normal sphincter tone and no palpable rectal lesions.      The entire examined colon appeared normal.      Non-bleeding external hemorrhoids were found during retroflexion. The       hemorrhoids were medium-sized.      There was evidence of a prior end-to-side colo-colonic anastomosis in       the sigmoid colon. This was patent and was characterized by healthy       appearing mucosa. The anastomosis was traversed. Impression:            - The entire examined colon is normal.                        - Non-bleeding external hemorrhoids.                        - Patent end-to-side colo-colonic anastomosis,                         characterized by healthy appearing mucosa.                        - No specimens collected. Recommendation:        - Discharge patient to home (with escort).                        -  Resume previous diet today.                        - Continue present medications.                        - Repeat colonoscopy in 10 years for surveillance. Procedure Code(s):     --- Professional ---                        D1497, Colorectal cancer screening; colonoscopy on                         individual not meeting criteria for high risk Diagnosis Code(s):     --- Professional ---                        Z12.11, Encounter for screening for malignant neoplasm                         of colon                        K64.4, Residual hemorrhoidal skin tags                        Z98.0, Intestinal bypass and anastomosis status CPT copyright 2019 American Medical Association. All rights reserved. The codes documented in this report are preliminary and upon coder review may  be revised to meet current compliance requirements. Dr. Ulyess Mort Lin Landsman MD,  MD 05/18/2020 8:52:13 AM This report has been signed electronically. Number of Addenda: 0 Note Initiated On: 05/18/2020 8:35 AM Scope Withdrawal Time: 0 hours 6 minutes 6 seconds  Total Procedure Duration: 0 hours 8 minutes 43 seconds  Estimated Blood Loss:  Estimated blood loss: none.      [facility]

## 2020-05-18 NOTE — Anesthesia Postprocedure Evaluation (Signed)
Anesthesia Post Note  Patient: Derek Cooley.  Procedure(s) Performed: COLONOSCOPY WITH PROPOFOL (N/A )  Patient location during evaluation: Endoscopy Anesthesia Type: General Level of consciousness: awake and alert and oriented Pain management: pain level controlled Vital Signs Assessment: post-procedure vital signs reviewed and stable Respiratory status: spontaneous breathing, nonlabored ventilation and respiratory function stable Cardiovascular status: blood pressure returned to baseline and stable Postop Assessment: no signs of nausea or vomiting Anesthetic complications: no     Last Vitals:  Vitals:   05/18/20 0913 05/18/20 0925  BP: (!) 167/114 (!) 179/109  Pulse:    Resp: (!) 21   Temp:    SpO2:      Last Pain:  Vitals:   05/18/20 0913  TempSrc:   PainSc: 0-No pain                 Lezlie Ritchey

## 2020-05-18 NOTE — H&P (Signed)
Derek Darby, MD [address]  [place], [place] [address]  Main: [phone]  Fax: [phone] Pager: [phone]  Primary Care Physician:  Volney American, PA-C Primary Gastroenterologist:  Dr. Cephas Cooley  Pre-Procedure History & Physical: HPI:  Derek Kintz. is a 54 y.o. male is here for an colonoscopy.   Past Medical History:  Diagnosis Date  . Abdominal abscess   . Acute pancreatitis   . Anxiety   . Asthma   . Back pain   . Depression   . Diabetes mellitus without complication ([place])   . Emphysema of lung ([place])    right sided  . GERD (gastroesophageal reflux disease)   . Hemorrhage into subarachnoid space of neuraxis ([place]) 01/26/2010  . Hypertension   . Mild cognitive impairment   . MRSA (methicillin resistant Staphylococcus aureus)   . Obesity   . Panic disorder   . Perforated bowel ([place]) 2007   Tempoary Colostomy Bag, Skin Graft for Abd wound  . Sleep apnea    sleep study 2013  . Subarachnoid hemorrhage ([place]) 2011   coil placed  . Type 2 diabetes mellitus without complication, without long-term current use of insulin ([place]) 04/17/2016  . Vitreous hemorrhage ([place]) 03/27/2011   Overview:  Bilateral; 01/2010, from [facility]     Past Surgical History:  Procedure Laterality Date  . COLON SURGERY  2007   colostomy bag placed s/p perforated bowel  . KNEE SURGERY Right   . Perforated bowel      Prior to Admission medications   Medication Sig Start Date End Date Taking? Authorizing Provider  albuterol (PROVENTIL) (2.5 MG/3ML) 0.083% nebulizer solution Take 3 mLs (2.5 mg total) by nebulization every 6 (six) hours as needed for wheezing or shortness of breath. 11/18/19  Yes Volney American, PA-C  albuterol (VENTOLIN HFA) 108 (90 Base) MCG/ACT inhaler Inhale 1-2 puffs into the lungs every 6 (six) hours as needed for wheezing or shortness of breath. 11/18/19  Yes Volney American, PA-C  amLODipine (NORVASC) 10 MG tablet Take 1 tablet  (10 mg total) by mouth daily. 06/29/19  Yes Volney American, PA-C  Blood Glucose Monitoring Suppl (ONE TOUCH ULTRA 2) w/Device KIT  08/05/19  Yes [provider]  carvedilol (COREG) 12.5 MG tablet Take 1 tablet (12.5 mg total) by mouth 2 (two) times a day. 06/29/19  Yes Volney American, PA-C  docusate (COLACE) 60 MG/15ML syrup Take 100 mg by mouth 2 (two) times daily as needed.   Yes [provider]  doxycycline (VIBRAMYCIN) 100 MG capsule TAKE 1 CAPSULE BY MOUTH TWICE DAILY 02/14/20  Yes Volney American, PA-C  DULoxetine (CYMBALTA) 60 MG capsule TAKE 1 CAPSULE TWICE DAILY 01/06/20  Yes Volney American, PA-C  fenofibrate micronized (LOFIBRA) 67 MG capsule TAKE 1 CAPSULE EVERY DAY BEFORE BREAKFAST 05/12/20  Yes Volney American, PA-C  fluticasone [facility]) 50 MCG/ACT nasal spray Place 2 sprays into both nostrils daily. 05/30/18  Yes Carles Collet M, PA-C  glipiZIDE (GLUCOTROL XL) 5 MG 24 hr tablet TAKE ONE TABLET EACH MORNING WITH BREAKFAST 03/16/20  Yes Volney American, PA-C  hydrOXYzine (ATARAX/VISTARIL) 50 MG tablet TAKE ONE TABLET 3 TIMES DAILY AS NEEDED 03/07/20  Yes Volney American, PA-C  hydrOXYzine (VISTARIL) 25 MG capsule Take 1 capsule (25 mg total) by mouth 2 (two) times daily as needed for anxiety. For anxiety attacks Patient taking differently: Take 25 mg by mouth 3 (three) times daily. For  anxiety attacks 07/23/18  Yes Ursula Alert, MD  ibuprofen (ADVIL) 800 MG tablet Take 1 tablet (800 mg total) by mouth every 8 (eight) hours as needed. 08/07/19  Yes Volney American, PA-C  Lancets (ONETOUCH DELICA PLUS [id number]) [place]  08/05/19  Yes [provider]  lisinopril (ZESTRIL) 20 MG tablet TAKE 1 TABLET BY MOUTH ONCE DAILY 04/02/20  Yes Volney American, PA-C  methocarbamol (ROBAXIN) 750 MG tablet Take 1,500 mg by mouth 4 (four) times daily. 02/25/20  Yes [provider]  ONETOUCH ULTRA test strip  08/05/19   Yes [provider]  pantoprazole (PROTONIX) 40 MG tablet Take 1 tablet (40 mg total) by mouth 2 (two) times daily as needed. 11/18/19  Yes Volney American, PA-C  pregabalin (LYRICA) 50 MG capsule Take 50 mg by mouth 4 (four) times daily.   Yes [provider]  Tapentadol HCl (NUCYNTA) 100 MG TABS Take 100 mg by mouth every 6 (six) hours.   Yes [provider]  Thiamine HCl (THIAMINE PO) Take 100 mg by mouth daily. 05/30/19 05/29/20 Yes [provider]  tiZANidine (ZANAFLEX) 4 MG tablet TAKE ONE TABLET TWICE DAILY AS NEEDED FOR MUSCLE SPASM 11/13/19  Yes Volney American, PA-C  topiramate (TOPAMAX) 50 MG tablet 50 mg 2 (two) times daily.  01/28/19  Yes [provider]  traZODone (DESYREL) 100 MG tablet Take 100 mg by mouth at bedtime.   Yes [provider]  umeclidinium-vilanterol (ANORO ELLIPTA) 62.5-25 MCG/INH AEPB Inhale 1 puff into the lungs daily. 09/09/19  Yes Volney American, PA-C    Allergies as of 03/04/2020 - Review Complete 03/04/2020  Allergen Reaction Noted  . Atorvastatin  07/18/2016  . Avelox [moxifloxacin hcl in nacl]  05/03/2015  . Buprenorphine  01/19/2020  . Dilaudid [hydromorphone hcl] Hives 05/03/2015  . Fluoxetine Itching 07/10/2015  . Levofloxacin Other (See Comments) 07/10/2015  . Morphine and related  06/29/2019  . Other  05/03/2015  . Vancomycin  05/03/2015    Family History  Problem Relation Age of Onset  . Arthritis Mother   . Asthma Mother   . Diabetes Mother   . Heart disease Mother   . Hyperlipidemia Mother   . Hypertension Mother   . Kidney disease Mother   . Thyroid disease Mother   . Lung disease Mother   . Anxiety disorder Mother   . Depression Mother   . Diabetes Father   . Heart disease Father   . Depression Father   . Anxiety disorder Father   . Arthritis Sister   . Asthma Sister   . Hyperlipidemia Sister   . Hypertension Sister   . Lung disease Sister   . Anxiety  disorder Sister   . Depression Sister   . Hyperlipidemia Brother   . Hypertension Brother   . Diabetes Sister   . Heart disease Sister   . Depression Sister   . Anxiety disorder Sister   . Anxiety disorder Brother   . Depression Brother   . Heart disease Brother     Social History   Socioeconomic History  . Marital status: Divorced    Spouse name: Not on file  . Number of children: Not on file  . Years of education: Not on file  . Highest education level: Not on file  Occupational History  . Not on file  Tobacco Use  . Smoking status: Current Every Day Smoker    Packs/day: 1.50    Types: Cigarettes  Start date: 10/11/1984  . Smokeless tobacco: Never Used  Substance and Sexual Activity  . Alcohol use: Yes    Alcohol/week: 0.0 standard drinks    Comment: occasionall   . Drug use: No  . Sexual activity: Not Currently  Other Topics Concern  . Not on file  Social History Narrative  . Not on file   Social Determinants of Health   Financial Resource Strain: Medium Risk  . Difficulty of Paying Living Expenses: Somewhat hard  Food Insecurity: No Food Insecurity  . Worried About Charity fundraiser in the Last Year: Never true  . Ran Out of Food in the Last Year: Never true  Transportation Needs: Unmet Transportation Needs  . Lack of Transportation (Medical): Yes  . Lack of Transportation (Non-Medical): Yes  Physical Activity: Inactive  . Days of Exercise per Week: 0 days  . Minutes of Exercise per Session: 0 min  Stress: No Stress Concern Present  . Feeling of Stress : Not at all  Social Connections: Moderately Isolated  . Frequency of Communication with Friends and Family: More than three times a week  . Frequency of Social Gatherings with Friends and Family: More than three times a week  . Attends Religious Services: Never  . Active Member of Clubs or Organizations: No  . Attends Archivist Meetings: Never  . Marital Status: Divorced  Human resources officer  Violence: Not At Risk  . Fear of Current or Ex-Partner: No  . Emotionally Abused: No  . Physically Abused: No  . Sexually Abused: No    Review of Systems: See HPI, otherwise negative ROS  Physical Exam: BP (!) 178/119 Comment: Dr. Randa Lynn aware  Pulse (!) 107   Temp (!) 97.3 F (36.3 C) (Temporal)   Resp 18   Ht _0  (1.473 m)   Wt 113.4 kg   SpO2 96%   BMI 52.25 kg/m  General:   Alert,  pleasant and cooperative in NAD Head:  Normocephalic and atraumatic. Neck:  Supple; no masses or thyromegaly. Lungs:  Clear throughout to auscultation.    Heart:  Regular rate and rhythm. Abdomen:  Soft, nontender and nondistended. Normal bowel sounds, without guarding, and without rebound.   Neurologic:  Alert and  oriented x4;  grossly normal neurologically.  Impression/Plan: Derek Binet. is here for an colonoscopy to be performed for colon cancer screening  Risks, benefits, limitations, and alternatives regarding  colonoscopy have been reviewed with the patient.  Questions have been answered.  All parties agreeable.   Sherri Sear, MD  05/18/2020, 8:33 AM

## 2020-05-19 ENCOUNTER — Encounter: Payer: Self-pay | Admitting: Gastroenterology

## 2020-05-19 ENCOUNTER — Telehealth: Payer: Self-pay

## 2020-05-19 DIAGNOSIS — J441 Chronic obstructive pulmonary disease with (acute) exacerbation: Secondary | ICD-10-CM

## 2020-05-19 MED ORDER — ALBUTEROL SULFATE HFA 108 (90 BASE) MCG/ACT IN AERS: 1.0000 | INHALATION_SPRAY | Freq: Four times a day (QID) | RESPIRATORY_TRACT | 3 refills | Status: DC | PRN

## 2020-05-19 NOTE — Telephone Encounter (Signed)
Can a prescription for proair be sent to [facility] in [place], they have a good rx coupon

## 2020-05-19 NOTE — Telephone Encounter (Signed)
Rx sent 

## 2020-05-26 ENCOUNTER — Other Ambulatory Visit: Payer: Self-pay | Admitting: Family Medicine

## 2020-06-24 ENCOUNTER — Other Ambulatory Visit: Payer: Self-pay | Admitting: Family Medicine

## 2020-06-24 DIAGNOSIS — I129 Hypertensive chronic kidney disease with stage 1 through stage 4 chronic kidney disease, or unspecified chronic kidney disease: Secondary | ICD-10-CM

## 2020-06-29 ENCOUNTER — Other Ambulatory Visit: Payer: Self-pay

## 2020-06-29 ENCOUNTER — Telehealth: Payer: Self-pay | Admitting: Family Medicine

## 2020-06-29 ENCOUNTER — Ambulatory Visit
Admission: RE | Admit: 2020-06-29 | Discharge: 2020-06-29 | Disposition: A | Discharge status: Home / Self Care | Payer: Medicare Other | Source: Ambulatory Visit | Attending: Family Medicine | Admitting: Family Medicine

## 2020-06-29 ENCOUNTER — Ambulatory Visit (INDEPENDENT_AMBULATORY_CARE_PROVIDER_SITE_OTHER): Payer: Medicare Other | Admitting: Family Medicine

## 2020-06-29 ENCOUNTER — Encounter: Payer: Self-pay | Admitting: Family Medicine

## 2020-06-29 ENCOUNTER — Ambulatory Visit
Admission: RE | Admit: 2020-06-29 | Discharge: 2020-06-29 | Disposition: A | Discharge status: Home / Self Care | Payer: Medicare Other | Source: Home / Self Care | Attending: Family Medicine | Admitting: Family Medicine

## 2020-06-29 VITALS — BP 196/124 | HR 82 | Temp 98.2°F | Wt 265.0 lb

## 2020-06-29 DIAGNOSIS — R0602 Shortness of breath: Secondary | ICD-10-CM

## 2020-06-29 DIAGNOSIS — N182 Chronic kidney disease, stage 2 (mild): Secondary | ICD-10-CM | POA: Diagnosis not present

## 2020-06-29 DIAGNOSIS — E1122 Type 2 diabetes mellitus with diabetic chronic kidney disease: Secondary | ICD-10-CM | POA: Diagnosis not present

## 2020-06-29 DIAGNOSIS — E781 Pure hyperglyceridemia: Secondary | ICD-10-CM

## 2020-06-29 DIAGNOSIS — F331 Major depressive disorder, recurrent, moderate: Secondary | ICD-10-CM

## 2020-06-29 DIAGNOSIS — I129 Hypertensive chronic kidney disease with stage 1 through stage 4 chronic kidney disease, or unspecified chronic kidney disease: Secondary | ICD-10-CM

## 2020-06-29 DIAGNOSIS — E119 Type 2 diabetes mellitus without complications: Secondary | ICD-10-CM

## 2020-06-29 DIAGNOSIS — Z9114 Patient's other noncompliance with medication regimen: Secondary | ICD-10-CM

## 2020-06-29 DIAGNOSIS — J441 Chronic obstructive pulmonary disease with (acute) exacerbation: Secondary | ICD-10-CM

## 2020-06-29 IMAGING — DX DG CHEST 2V
2 series · 3 of 3 positions shown · non-contrast
Comparison: [DATE]

CLINICAL DATA: Shortness of breath for 1 month

EXAM:
CHEST - 2 VIEW

[Series 1: chest pa · 0.14mm/px · 2 of 2 slices shown]
[im 1/2]
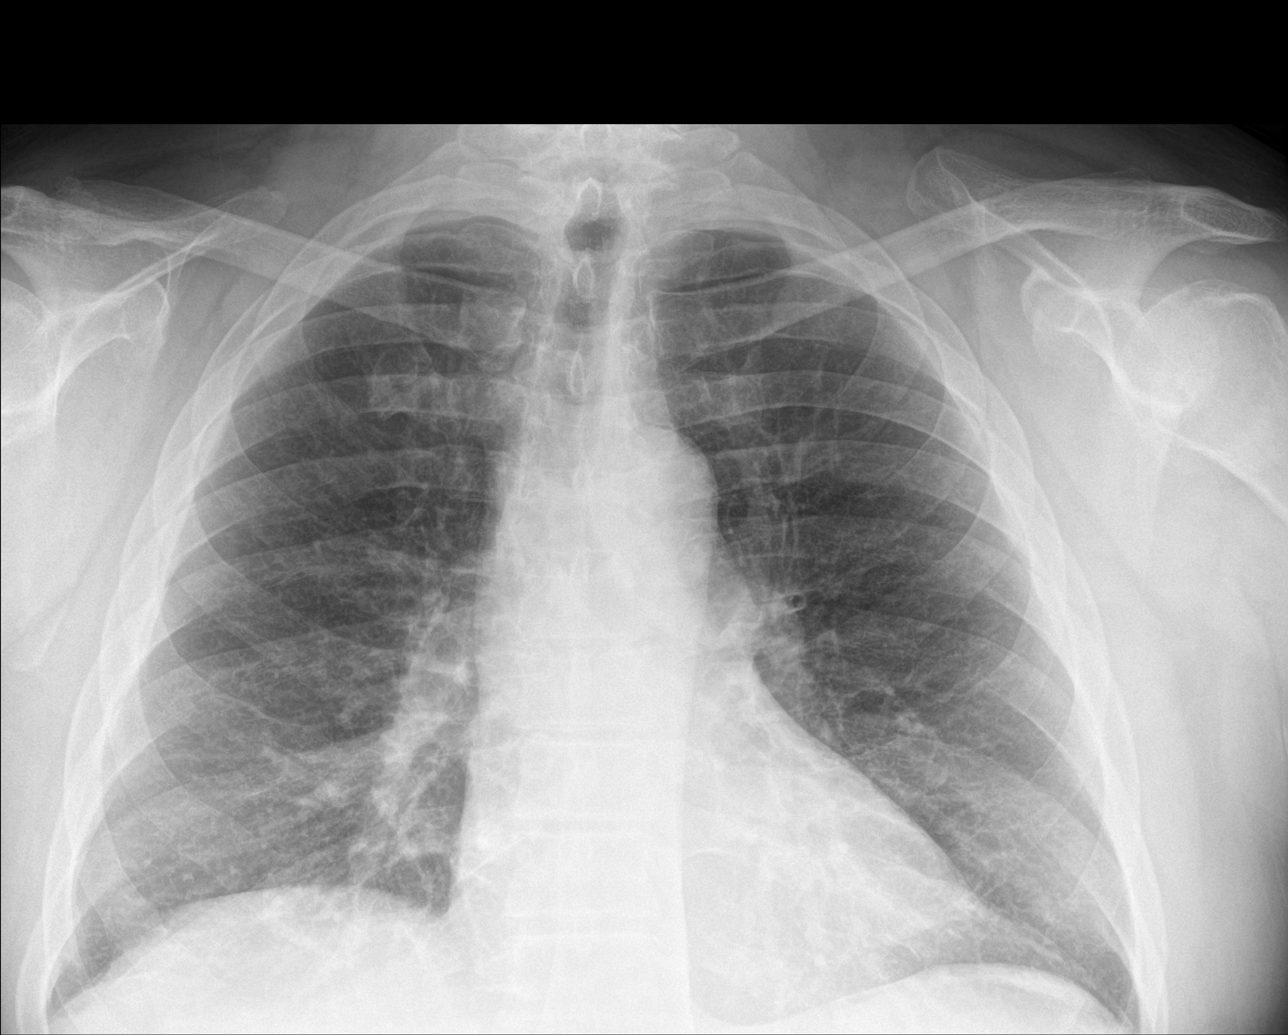
[im 2/2]
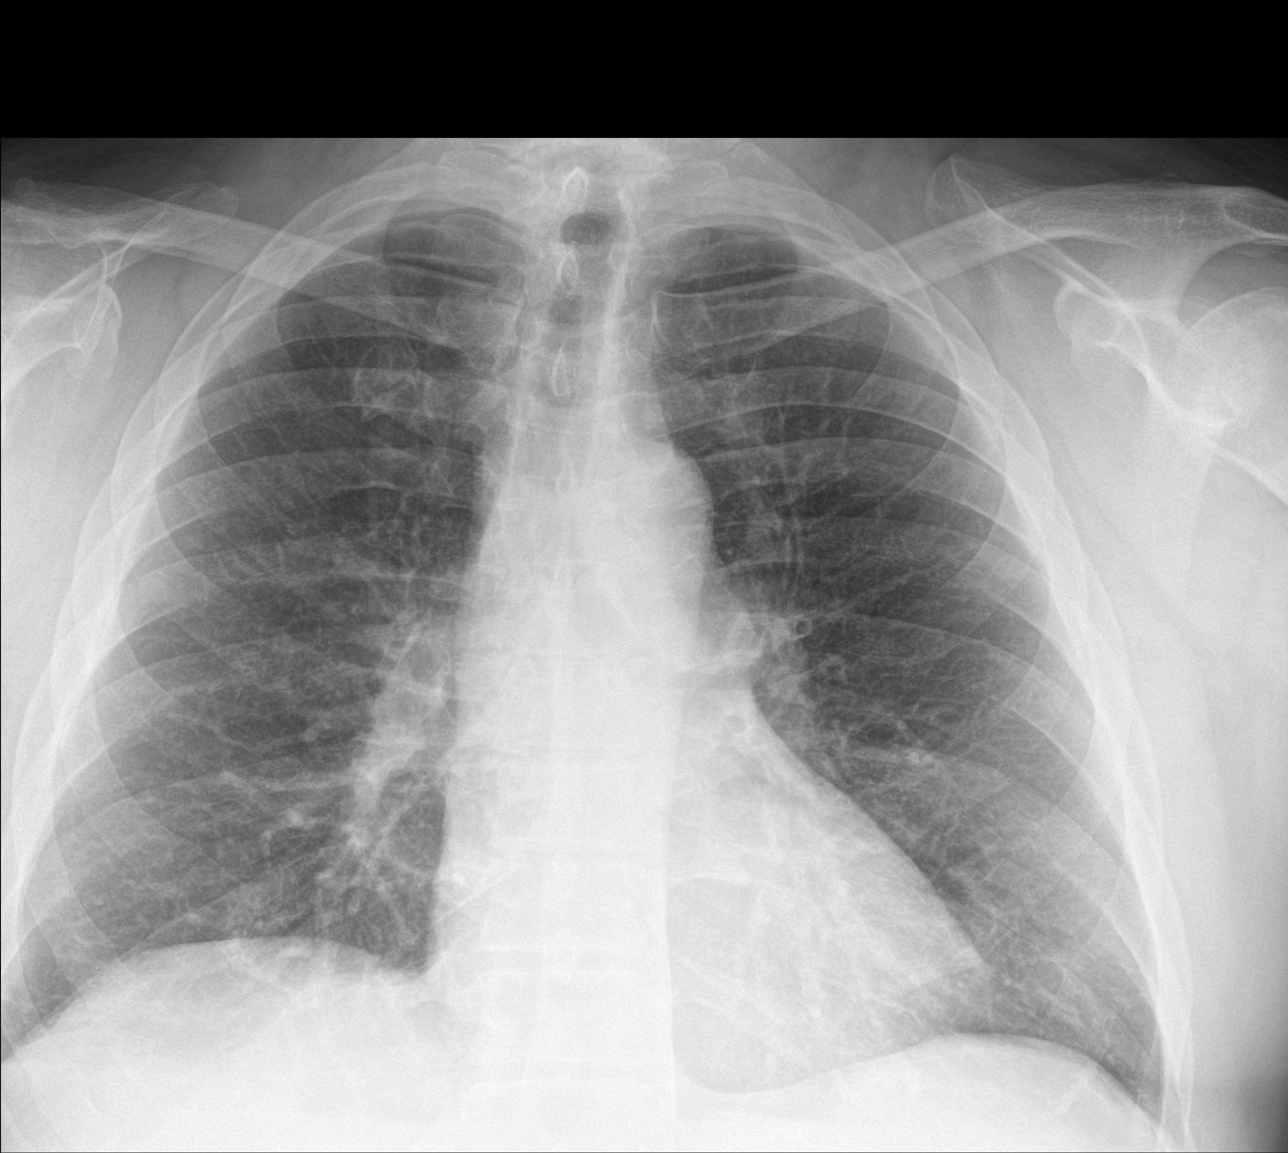

[chest lat]
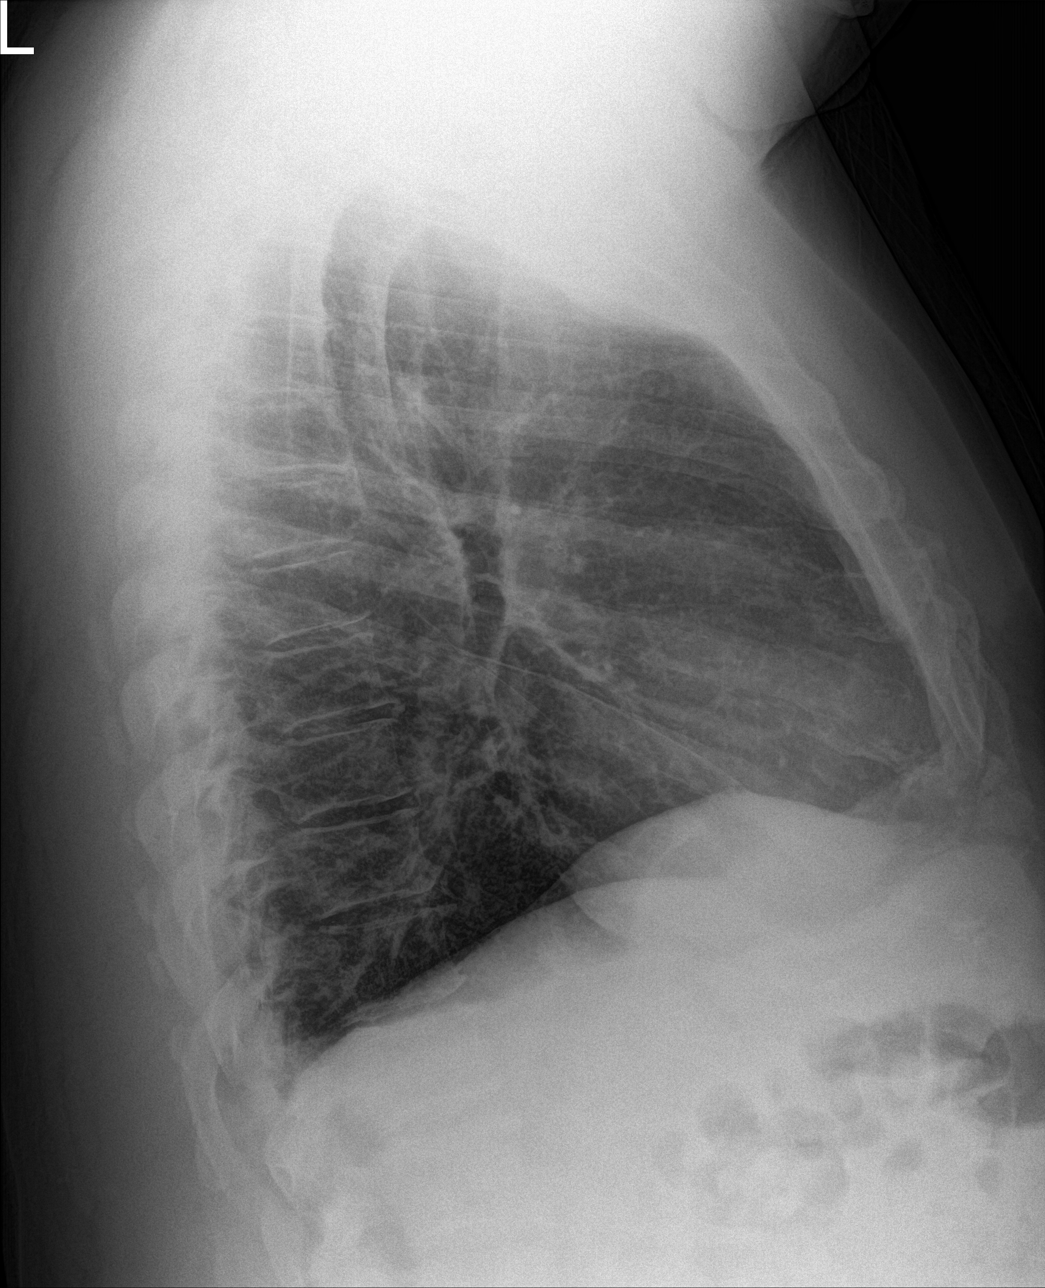

[3 of 3 positions shown; findings below may reference images not displayed]

FINDINGS: The heart size and mediastinal contours are within normal limits.
Both lungs are clear. The visualized skeletal structures are
unremarkable.
IMPRESSION: No active cardiopulmonary disease.

## 2020-06-29 MED ORDER — FUROSEMIDE 20 MG PO TABS: 20.0000 mg | ORAL_TABLET | Freq: Every day | ORAL | 0 refills | Status: DC

## 2020-06-29 MED ORDER — ALBUTEROL SULFATE (2.5 MG/3ML) 0.083% IN NEBU
2.5000 mg | INHALATION_SOLUTION | Freq: Once | RESPIRATORY_TRACT | Status: AC
  Administered 2020-06-29: 2.5 mg via RESPIRATORY_TRACT

## 2020-06-29 MED ORDER — PREDNISONE 10 MG PO TABS
ORAL_TABLET | ORAL | 0 refills | Status: DC
Start: 2020-06-29 — End: 2020-07-19

## 2020-06-29 NOTE — Chronic Care Management (AMB) (Signed)
  Care Management   Note  06/29/2020 Name: Derek Cooley. MRN: [id number] DOB: Jul 16, 1966  Derek Cooley. is a 54 y.o. year old male who is a primary care patient of Volney American, Hershal Coria and is actively engaged with the care management team. I reached out to [employer]. by phone today to assist with scheduling a follow up visit with the Pharmacist  Follow up plan: Unsuccessful telephone outreach attempt made. A HIPPA compliant phone message was left for the patient providing contact information and requesting a return call.  The care management team will reach out to the patient again over the next 2 days.  If patient returns call to provider office, please advise to call [facility]  at [place], [place], [place], [place] [address] Direct Dial: [phone] [email or web address]: [email or web address]

## 2020-06-29 NOTE — Progress Notes (Signed)
BP (!) 196/124   Pulse 82   Temp 98.2 F (36.8 C) (Oral)   Wt 265 lb (120.2 kg)   SpO2 94%   BMI 55.39 kg/m    Subjective:    Patient ID: Derek Binet., male    DOB: [birth year], 54 y.o.   MRN: [id number]  HPI: Derek Nydam. is a 54 y.o. male  Chief Complaint  Patient presents with  . Eye Problem    bilateral redness and buring x about a month  . Shortness of Breath   Presenting today with sister for several concerns. Sister is typically a primary caregiver, but she states since his last visit he has refused to allow her to manage his medications and ensure he's taking what he needs to be so he has not been taking most of his medications and is unsure what he has been taking. Hx of TBI and polysubstance abuse which has altered his memory and critical thinking skills in this way.   B/l eye redness and burning that's been ongoing for about a month. Also some congestion, scratchy throat. Typically on zyrtec and flonase for allergic rhinitis but has not been taking. Denies visual changes, fever, N/V. Also due for annual eye exam.   Significant SOB, worsening the past few months. Hx of COPD, and he's not sure if he's been taking his inhalers the past few months. Has also been very sedentary and has gained over 15 lb the past 3 months. Some LE edema at times.   HTN - historically very poorly controlled, mostly due to compliance issues. He does not think he's been taking any of his medication for this. Of note, in the past when his medication regimen has been increased he has had hypotensive episodes when he does take his medications regularly. Not checking home BPs.   HLD - has not been watching diet, exercising, or taking his medication regularly.   DM - Unclear if he's taking or how he's taking his medication. Not checking home BSs.   Relevant past medical, surgical, family and social history reviewed and updated as indicated. Interim medical history since our last visit  reviewed. Allergies and medications reviewed and updated.  Review of Systems  Per HPI unless specifically indicated above     Objective:    BP (!) 196/124   Pulse 82   Temp 98.2 F (36.8 C) (Oral)   Wt 265 lb (120.2 kg)   SpO2 94%   BMI 55.39 kg/m   Wt Readings from Last 3 Encounters:  06/29/20 265 lb (120.2 kg)  05/18/20 250 lb (113.4 kg)  03/23/20 244 lb (110.7 kg)    Physical Exam Vitals and nursing note reviewed.  Constitutional:      Appearance: Normal appearance. He is not ill-appearing.  HENT:     Head: Atraumatic.     Right Ear: Tympanic membrane normal.     Left Ear: Tympanic membrane normal.  Eyes:     Extraocular Movements: Extraocular movements intact.     Comments: Conjunctiva mildly injected b/l. No active discharge from eyes  Cardiovascular:     Rate and Rhythm: Normal rate and regular rhythm.     Heart sounds: Normal heart sounds.  Pulmonary:     Effort: No respiratory distress.     Breath sounds: No rales.     Comments: Effort mildly labored at rest. Diffuse wheezes, moderate to severe restriction of breath sounds Abdominal:     General: Bowel sounds are normal. There  is no distension.     Palpations: Abdomen is soft.     Tenderness: There is no abdominal tenderness. There is no guarding.  Musculoskeletal:        General: Normal range of motion.     Cervical back: Normal range of motion and neck supple.  Skin:    General: Skin is warm and dry.     Findings: No rash.  Neurological:     Mental Status: He is alert. Mental status is at baseline.     Motor: No weakness.     Gait: Gait normal.  Psychiatric:        Mood and Affect: Mood normal.        Thought Content: Thought content normal.        Judgment: Judgment normal.     Results for orders placed or performed in visit on 06/29/20  CBC with Differential/Platelet  Result Value Ref Range   WBC 9.5 3.4 - 10.8 x10E3/uL   RBC 5.25 4.14 - 5.80 x10E6/uL   Hemoglobin 16.0 13.0 - 17.7 g/dL    Hematocrit 47.0 37.5 - 51.0 %   MCV 90 79 - 97 fL   MCH 30.5 26.6 - 33.0 pg   MCHC 34.0 31 - 35 g/dL   RDW 14.6 11.6 - 15.4 %   Platelets 229 150 - 450 x10E3/uL   Neutrophils 53 Not Estab. %   Lymphs 32 Not Estab. %   Monocytes 9 Not Estab. %   Eos 4 Not Estab. %   Basos 1 Not Estab. %   Neutrophils Absolute 5.1 1 - 7 x10E3/uL   Lymphocytes Absolute 3.0 0 - 3 x10E3/uL   Monocytes Absolute 0.9 0 - 0 x10E3/uL   EOS (ABSOLUTE) 0.4 0.0 - 0.4 x10E3/uL   Basophils Absolute 0.1 0 - 0 x10E3/uL   Immature Granulocytes 1 Not Estab. %   Immature Grans (Abs) 0.1 0.0 - 0.1 x10E3/uL  Comprehensive metabolic panel  Result Value Ref Range   Glucose 276 (H) 65 - 99 mg/dL   BUN 15 6 - 24 mg/dL   Creatinine, Ser 1.41 (H) 0.76 - 1.27 mg/dL   GFR calc non Af Amer 56 (L) >59 mL/min/1.73   GFR calc Af Amer 65 >59 mL/min/1.73   BUN/Creatinine Ratio 11 9 - 20   Sodium 139 134 - 144 mmol/L   Potassium 4.4 3.5 - 5.2 mmol/L   Chloride 103 96 - 106 mmol/L   CO2 25 20 - 29 mmol/L   Calcium 9.5 8.7 - 10.2 mg/dL   Total Protein 6.7 6.0 - 8.5 g/dL   Albumin 4.3 3.8 - 4.9 g/dL   Globulin, Total 2.4 1.5 - 4.5 g/dL   Albumin/Globulin Ratio 1.8 1.2 - 2.2   Bilirubin Total 0.3 0.0 - 1.2 mg/dL   Alkaline Phosphatase 122 (H) 48 - 121 IU/L   AST 19 0 - 40 IU/L   ALT 27 0 - 44 IU/L  HgB A1c  Result Value Ref Range   Hgb A1c MFr Bld 8.6 (H) 4.8 - 5.6 %   Est. average glucose Bld gHb Est-mCnc 200 mg/dL  Lipid Panel w/o Chol/HDL Ratio  Result Value Ref Range   Cholesterol, Total 231 (H) 100 - 199 mg/dL   Triglycerides 478 (H) 0 - 149 mg/dL   HDL 35 (L) >39 mg/dL   VLDL Cholesterol Cal 83 (H) 5 - 40 mg/dL   LDL Chol Calc (NIH) 113 (H) 0 - 99 mg/dL  B Nat Peptide  Result Value  Ref Range   BNP 16.1 0.0 - 100.0 pg/mL      Assessment & Plan:   Problem List Items Addressed This Visit      Respiratory   COPD (chronic obstructive pulmonary disease) (HCC)    Significant exacerbation - some mild improvement  with nebulizer tx in office. Tx with prednisone taper, obtain CXR, restart inhaler regimen, smoking cessation      Relevant Medications   predniSONE (DELTASONE) 10 MG tablet     Endocrine   Controlled type 2 diabetes mellitus without complication, without long-term current use of insulin ([place])    Not taking medications or watching lifestyle habits, recheck A1C and work on restarting medications faithfully.         Genitourinary   Benign hypertensive renal disease - Primary    Significantly poorly controlled today but not taking his medications. WIll hold off on altering his regimen at this time given hx of hypotension when he does take as prescribed. DASH diet, stress control, avoidance of alcohol and other substances.       Relevant Orders   CBC with Differential/Platelet (Completed)   Comprehensive metabolic panel (Completed)   CKD (chronic kidney disease) stage 2, GFR 60-89 ml/min    Recheck labs, hydrate, better BP and BS control is essential for him and this was reviewed at length        Other   Depression    Followed by Psychiatry, continue per their recommendations      Hypertriglyceridemia    Recheck lipids, restart medications, work on diet and exercise changes. Adjust regimen as needed      Relevant Medications   furosemide (LASIX) 20 MG tablet   Other Relevant Orders   Lipid Panel w/o Chol/HDL Ratio (Completed)   Nonadherence to medication    Already following with CCM, have asked that they continue close support with him particularly Pharmacy given the extent of his chronic conditions and risks of fatal events with continued poor adherence and control. Family helping as able, which he's lately been very resistant to.        Other Visit Diagnoses    Type 2 diabetes mellitus with chronic kidney disease, without long-term current use of insulin, unspecified CKD stage (HCC)       Relevant Orders   HgB A1c (Completed)   SOB (shortness of breath)       Relevant  Medications   albuterol (PROVENTIL) (2.5 MG/3ML) 0.083% nebulizer solution 2.5 mg (Completed)   Other Relevant Orders   B Nat Peptide (Completed)   DG Chest 2 View (Completed)     1 hour spent today in direct patient care and counseling, 20 minutes spent in documentation and record review  Follow up plan: Return in about 2 weeks (around 07/13/2020) for BP, SOB f/u.

## 2020-06-30 LAB — COMPREHENSIVE METABOLIC PANEL
ALT: 27 IU/L (ref 0–44)
AST: 19 IU/L (ref 0–40)
Albumin/Globulin Ratio: 1.8 (ref 1.2–2.2)
Albumin: 4.3 g/dL (ref 3.8–4.9)
Alkaline Phosphatase: 122 IU/L — ABNORMAL HIGH (ref 48–121)
BUN/Creatinine Ratio: 11 (ref 9–20)
BUN: 15 mg/dL (ref 6–24)
Bilirubin Total: 0.3 mg/dL (ref 0.0–1.2)
CO2: 25 mmol/L (ref 20–29)
Calcium: 9.5 mg/dL (ref 8.7–10.2)
Chloride: 103 mmol/L (ref 96–106)
Creatinine, Ser: 1.41 mg/dL — ABNORMAL HIGH (ref 0.76–1.27)
GFR calc Af Amer: 65 mL/min/{1.73_m2} (ref >59)
GFR calc non Af Amer: 56 mL/min/{1.73_m2} — ABNORMAL LOW (ref >59)
Globulin, Total: 2.4 g/dL (ref 1.5–4.5)
Glucose: 276 mg/dL — ABNORMAL HIGH (ref 65–99)
Potassium: 4.4 mmol/L (ref 3.5–5.2)
Sodium: 139 mmol/L (ref 134–144)
Total Protein: 6.7 g/dL (ref 6.0–8.5)

## 2020-06-30 LAB — LIPID PANEL W/O CHOL/HDL RATIO
Cholesterol, Total: 231 mg/dL — ABNORMAL HIGH (ref 100–199)
HDL: 35 mg/dL — ABNORMAL LOW (ref >39)
LDL Chol Calc (NIH): 113 mg/dL — ABNORMAL HIGH (ref 0–99)
Triglycerides: 478 mg/dL — ABNORMAL HIGH (ref 0–149)
VLDL Cholesterol Cal: 83 mg/dL — ABNORMAL HIGH (ref 5–40)

## 2020-06-30 LAB — CBC WITH DIFFERENTIAL/PLATELET
Basophils Absolute: 0.1 10*3/uL (ref 0.0–0.2)
Basos: 1 %
EOS (ABSOLUTE): 0.4 10*3/uL (ref 0.0–0.4)
Eos: 4 %
Hematocrit: 47 % (ref 37.5–51.0)
Hemoglobin: 16 g/dL (ref 13.0–17.7)
Immature Grans (Abs): 0.1 10*3/uL (ref 0.0–0.1)
Immature Granulocytes: 1 %
Lymphocytes Absolute: 3 10*3/uL (ref 0.7–3.1)
Lymphs: 32 %
MCH: 30.5 pg (ref 26.6–33.0)
MCHC: 34 g/dL (ref 31.5–35.7)
MCV: 90 fL (ref 79–97)
Monocytes Absolute: 0.9 10*3/uL (ref 0.1–0.9)
Monocytes: 9 %
Neutrophils Absolute: 5.1 10*3/uL (ref 1.4–7.0)
Neutrophils: 53 %
Platelets: 229 10*3/uL (ref 150–450)
RBC: 5.25 x10E6/uL (ref 4.14–5.80)
RDW: 14.6 % (ref 11.6–15.4)
WBC: 9.5 10*3/uL (ref 3.4–10.8)

## 2020-06-30 LAB — BRAIN NATRIURETIC PEPTIDE: BNP: 16.1 pg/mL (ref 0.0–100.0)

## 2020-06-30 LAB — HEMOGLOBIN A1C
Est. average glucose Bld gHb Est-mCnc: 200 mg/dL
Hgb A1c MFr Bld: 8.6 % — ABNORMAL HIGH (ref 4.8–5.6)

## 2020-06-30 NOTE — Chronic Care Management (AMB) (Signed)
  Care Management   Note  06/30/2020 Name: Derek Cooley. MRN: [id number] DOB: [birth year]  Derek Cooley. is a 54 y.o. year old male who is a primary care patient of Volney American, Hershal Coria and is actively engaged with the care management team. I reached out to Derek Cooley. by phone today to assist with scheduling a follow up visit with the Pharmacist  Follow up plan: Telephone appointment with care management team member scheduled for: Pharm D 07/05/2020   Noreene Larsson, [address], [place], [place] [id number] Direct Dial: [phone] [email or web address] Website: [email or web address]

## 2020-07-04 NOTE — Chronic Care Management (AMB) (Incomplete)
Chronic Care Management Pharmacy  Name: Derek Cooley.  MRN: [id number] DOB: 27-Oct-1966   Chief Complaint/ HPI  Derek Binet.,  54 y.o. , male presents for their Follow-Up CCM visit with the clinical pharmacist via telephone due to COVID-19 Pandemic.  PCP : Volney American, PA-C Patient Care Team: Volney American, PA-C as PCP - General (Family Medicine) De Hollingshead, [facility] as Pharmacist (Pharmacist) Vladimir Faster, [facility] (Pharmacist)  Their chronic conditions include: Hypertension, Hyperlipidemia, Diabetes, GERD, COPD, Chronic Kidney Disease, Depression, Anxiety and Tobacco use   Office Visits: 06/29/20-Rachel Orene Desanctis, PA-C - blood work, lasix and prednisone taper, CXR-negative  Consult Visit: 05/18/20 - Dr. Marius Ditch, GI- colonoscopy  Allergies  Allergen Reactions  . Atorvastatin     Joint Aches - Severe Joint Aches - Severe Joint Aches - Severe  . Avelox [Moxifloxacin Hcl In Nacl]     Muscle pain  . Buprenorphine     Mouth sores, confusion, shaking  . Dilaudid [Hydromorphone Hcl] Hives  . Fluoxetine Itching  . Levofloxacin Other (See Comments)    Joint Pain  . Morphine And Related   . Other     Muscle pain  . Suboxone [Buprenorphine Hcl-Naloxone Hcl] Other (See Comments)    Rash and confused  . Vancomycin     Renal insufficiency    Medications: Outpatient Encounter Medications as of 07/05/2020  Medication Sig Note  . albuterol (PROVENTIL) (2.5 MG/3ML) 0.083% nebulizer solution Take 3 mLs (2.5 mg total) by nebulization every 6 (six) hours as needed for wheezing or shortness of breath.   Marland Kitchen albuterol (VENTOLIN HFA) 108 (90 Base) MCG/ACT inhaler Inhale 1-2 puffs into the lungs every 6 (six) hours as needed for wheezing or shortness of breath.   Marland Kitchen amLODipine (NORVASC) 10 MG tablet TAKE ONE TABLET EVERY DAY   . Blood Glucose Monitoring Suppl (ONE TOUCH ULTRA 2) w/Device KIT  (Patient not taking: Reported on 06/29/2020)   . carvedilol (COREG) 12.5  MG tablet Take 1 tablet (12.5 mg total) by mouth 2 (two) times a day.   . docusate (COLACE) 60 MG/15ML syrup Take 100 mg by mouth 2 (two) times daily as needed. (Patient not taking: Reported on 06/29/2020)   . doxycycline (VIBRAMYCIN) 100 MG capsule TAKE 1 CAPSULE BY MOUTH TWICE DAILY   . DULoxetine (CYMBALTA) 60 MG capsule TAKE 1 CAPSULE TWICE DAILY   . fenofibrate micronized (LOFIBRA) 67 MG capsule TAKE 1 CAPSULE EVERY DAY BEFORE BREAKFAST   . fluticasone (FLONASE) 50 MCG/ACT nasal spray Place 2 sprays into both nostrils daily.   . furosemide (LASIX) 20 MG tablet Take 1 tablet (20 mg total) by mouth daily.   Marland Kitchen glipiZIDE (GLUCOTROL XL) 5 MG 24 hr tablet TAKE ONE TABLET EACH MORNING WITH BREAKFAST   . hydrOXYzine (ATARAX/VISTARIL) 50 MG tablet TAKE ONE TABLET 3 TIMES DAILY AS NEEDED   . ibuprofen (ADVIL) 800 MG tablet Take 1 tablet (800 mg total) by mouth every 8 (eight) hours as needed.   . Lancets (ONETOUCH DELICA PLUS [id number]) [place]  (Patient not taking: Reported on 06/29/2020)   . lisinopril (ZESTRIL) 20 MG tablet TAKE 1 TABLET BY MOUTH ONCE DAILY   . methocarbamol (ROBAXIN) 750 MG tablet Take 1,500 mg by mouth 4 (four) times daily. 03/04/2020: As needed  . ONETOUCH ULTRA test strip  (Patient not taking: Reported on 06/29/2020)   . pantoprazole (PROTONIX) 40 MG tablet Take 1 tablet (40 mg total) by mouth 2 (two) times daily as needed.   Marland Kitchen  predniSONE (DELTASONE) 10 MG tablet Take 6 tabs day one, 5 tabs day two, 4 tabs day three, etc   . pregabalin (LYRICA) 50 MG capsule Take 50 mg by mouth 4 (four) times daily.   Marland Kitchen tiZANidine (ZANAFLEX) 4 MG tablet TAKE ONE TABLET TWICE DAILY AS NEEDED FOR MUSCLE SPASM 02/24/2020: Taking five times daily   . topiramate (TOPAMAX) 50 MG tablet 50 mg 2 (two) times daily.    . traZODone (DESYREL) 100 MG tablet Take 100 mg by mouth at bedtime.   Marland Kitchen umeclidinium-vilanterol (ANORO ELLIPTA) 62.5-25 MCG/INH AEPB Inhale 1 puff into the lungs daily.    No  facility-administered encounter medications on file as of 07/05/2020.     Current Diagnosis/Assessment:      COPD / Asthma / Tobacco     Gold Grade: {CHL HP Upstream Pharm COPD Gold HUDJS:[id number]} Current COPD Classification:  {CHL HP Upstream Pharm COPD Classification:[phone]}  Eosinophil count:   Lab Results  Component Value Date/Time   EOSPCT 3 03/23/2020 01:22 PM   EOSPCT 5.2 08/12/2014 10:19 AM  %                               Eos (Absolute):  Lab Results  Component Value Date/Time   EOSABS 0.4 06/29/2020 10:33 AM   EOSABS 0.4 08/12/2014 10:19 AM    Tobacco Status:  Social History   Tobacco Use  Smoking Status Current Every Day Smoker  . Packs/day: 2.00  . Types: Cigarettes  . Start date: 10/11/1984  Smokeless Tobacco Never Used    Patient has failed these meds in past: *** Patient is currently {CHL Controlled/Uncontrolled:[phone]} on the following medications: *** Using maintenance inhaler regularly? {yes/no:20286} Frequency of rescue inhaler use:  {CHL HP Upstream Pharm Inhaler IFOY:7741287867}  We discussed:  {CHL HP Upstream Pharmacy [phone]}  Plan  Continue {CHL HP Upstream Pharmacy Plans:3160440946} ,  Diabetes   Recent Relevant Labs: Lab Results  Component Value Date/Time   HGBA1C 8.6 (H) 06/29/2020 10:33 AM   HGBA1C 6.2 (H) 09/09/2019 03:34 PM   HGBA1C 5.6 04/17/2016 08:25 AM   MICROALBUR 30 (H) 04/17/2016 08:25 AM     Checking BG: {CHL HP Blood Glucose Monitoring [phone]}  Recent FBG Readings: Recent pre-meal BG readings: *** Recent 2hr PP BG readings:  *** Recent HS BG readings: *** Patient has failed these meds in past: *** Patient is currently {CHL Controlled/Uncontrolled:[phone]} on the following medications: ***  Last diabetic Foot exam:  Lab Results  Component Value Date/Time   HMDIABEYEEXA No Retinopathy 07/01/2019 12:00 AM    Last diabetic Eye exam: No results found for:  HMDIABFOOTEX   We discussed: {CHL HP Upstream Pharmacy [phone]}  Plan  Continue {CHL HP Upstream Pharmacy Plans:3160440946} and  Hypertension  H/O Cerebral aneurysm BP today is:  {CHL HP UPSTREAM Pharmacist BP [phone]}  Office blood pressures are  BP Readings from Last 3 Encounters:  06/29/20 (!) 196/124  05/18/20 (!) 179/109  03/23/20 (!) 153/92   CMP Latest Ref Rng & Units 06/29/2020 03/23/2020 12/30/2019  Glucose 65 - 99 mg/dL 276(H) 120(H) 94  BUN 6 - 24 mg/dL 15 23(H) 40(H)  Creatinine 0.76 - 1.27 mg/dL 1.41(H) 1.92(H) 2.27(H)  Sodium 134 - 144 mmol/L 139 139 139  Potassium 3.5 - 5.2 mmol/L 4.4 4.3 3.7  Chloride 96 - 106 mmol/L 103 108 107  CO2 20 - 29 mmol/L '25 23 25  ' Calcium 8.7 - 10.2 mg/dL 9.5 9.1  8.3(L)  Total Protein 6.0 - 8.5 g/dL 6.7 7.3 -  Total Bilirubin 0.0 - 1.2 mg/dL 0.3 1.0 -  Alkaline Phos 48 - 121 IU/L 122(H) 65 -  AST 0 - 40 IU/L 19 20 -  ALT 0 - 44 IU/L 27 19 -   Estimated Creatinine Clearance: 63.8 mL/min (A) (by C-G formula based on SCr of 1.41 mg/dL (H)).   Patient has failed these meds in the past: ***  Patient checks BP at home {CHL HP BP Monitoring [phone]}  Patient home BP readings are ranging: ***  We discussed {CHL HP Upstream Pharmacy discussion:[phone]}  Plan  Continue {CHL HP Upstream Pharmacy [phone]}     Hyperlipidemia   LDL goal < ***  Lipid Panel     Component Value Date/Time   CHOL 231 (H) 06/29/2020 1033   TRIG 478 (H) 06/29/2020 1033   HDL 35 (L) 06/29/2020 1033   LDLCALC 113 (H) 06/29/2020 1033    Hepatic Function Latest Ref Rng & Units 06/29/2020 03/23/2020 12/30/2019  Total Protein 6.0 - 8.5 g/dL 6.7 7.3 6.8  Albumin 3.8 - 4.9 g/dL 4.3 4.1 3.8  AST 0 - 40 IU/L '19 20 31  ' ALT 0 - 44 IU/L '27 19 21  ' Alk Phosphatase 48 - 121 IU/L 122(H) 65 74  Total Bilirubin 0.0 - 1.2 mg/dL 0.3 1.0 0.9     The 10-year ASCVD risk score Mikey Bussing DC Jr., et al., 2013) is: 56.7%   Values  used to calculate the score:     Age: 60 years     Sex: Male     Is Non-Hispanic African American: No     Diabetic: Yes     Tobacco smoker: Yes     Systolic Blood Pressure: 774 mmHg     Is BP treated: Yes     HDL Cholesterol: 35 mg/dL     Total Cholesterol: 231 mg/dL   Patient has failed these meds in past: atorvastatin Patient is currently uncontrolled on the following medications:  . Fenofibrate 67 mg qam ac B  We discussed:  Intolerance to statin*** Once weekly?  Plan  Continue {CHL HP Upstream Pharmacy FSELT:[id number]}    Medication Management   Pt uses *** pharmacy for all medications Uses pill box? {Yes or If no, why not?:20788} Pt endorses ***% compliance  We discussed: ***  Plan  {US Pharmacy WYSH:68372}    Follow up: *** month phone visit  ***

## 2020-07-05 ENCOUNTER — Telehealth: Payer: Self-pay | Admitting: Pharmacist

## 2020-07-05 ENCOUNTER — Ambulatory Visit: Payer: Medicare Other

## 2020-07-05 NOTE — Progress Notes (Addendum)
  Chronic Care Management   Outreach Note  07/05/2020 Name: Derek Cooley. MRN: [id number] DOB: [birth year]  Referred by: Volney American, PA-C Reason for referral : Chronic Care Management  An unsuccessful telephone outreach was attempted today. The patient was referred to the pharmacist for assistance with care management and care coordination.   Follow Up Plan:  Will reconnect with patient's sister, [name], in 2-3 days to reschedule.  Junita Push. Kenton Kingfisher PharmD, [facility] [phone]

## 2020-07-06 ENCOUNTER — Telehealth: Payer: Self-pay | Admitting: Family Medicine

## 2020-07-06 DIAGNOSIS — Z9114 Patient's other noncompliance with medication regimen: Secondary | ICD-10-CM | POA: Insufficient documentation

## 2020-07-06 NOTE — Assessment & Plan Note (Signed)
Not taking medications or watching lifestyle habits, recheck A1C and work on restarting medications faithfully.

## 2020-07-06 NOTE — Assessment & Plan Note (Signed)
Significantly poorly controlled today but not taking his medications. WIll hold off on altering his regimen at this time given hx of hypotension when he does take as prescribed. DASH diet, stress control, avoidance of alcohol and other substances.

## 2020-07-06 NOTE — Assessment & Plan Note (Signed)
Followed by Psychiatry, continue per their recommendations 

## 2020-07-06 NOTE — Telephone Encounter (Signed)
Copied from [place] [phone]. Topic: General - Inquiry >> Jun 30, 2020  4:31 PM Mathis Bud wrote: Reason for CRM: Patient is requesting PCP to call back regarding patient chest xrays. Sister is on [place]. Call back [phone]

## 2020-07-06 NOTE — Assessment & Plan Note (Signed)
Significant exacerbation - some mild improvement with nebulizer tx in office. Tx with prednisone taper, obtain CXR, restart inhaler regimen, smoking cessation

## 2020-07-06 NOTE — Telephone Encounter (Signed)
Called [name] back. Advised her of the result message on patient's chest x-ray.

## 2020-07-06 NOTE — Assessment & Plan Note (Signed)
Recheck lipids, restart medications, work on diet and exercise changes. Adjust regimen as needed

## 2020-07-06 NOTE — Assessment & Plan Note (Signed)
Recheck labs, hydrate, better BP and BS control is essential for him and this was reviewed at length

## 2020-07-06 NOTE — Assessment & Plan Note (Signed)
Already following with CCM, have asked that they continue close support with him particularly Pharmacy given the extent of his chronic conditions and risks of fatal events with continued poor adherence and control. Family helping as able, which he's lately been very resistant to.

## 2020-07-13 ENCOUNTER — Ambulatory Visit: Payer: Medicare Other | Admitting: Family Medicine

## 2020-07-19 ENCOUNTER — Other Ambulatory Visit: Payer: Self-pay

## 2020-07-19 ENCOUNTER — Encounter: Payer: Self-pay | Admitting: Family Medicine

## 2020-07-19 ENCOUNTER — Ambulatory Visit (INDEPENDENT_AMBULATORY_CARE_PROVIDER_SITE_OTHER): Payer: Medicare Other | Admitting: Family Medicine

## 2020-07-19 VITALS — BP 161/102 | HR 75 | Temp 97.8°F | Wt 257.0 lb

## 2020-07-19 DIAGNOSIS — I129 Hypertensive chronic kidney disease with stage 1 through stage 4 chronic kidney disease, or unspecified chronic kidney disease: Secondary | ICD-10-CM | POA: Diagnosis not present

## 2020-07-19 DIAGNOSIS — Z91148 Patient's other noncompliance with medication regimen for other reason: Secondary | ICD-10-CM

## 2020-07-19 DIAGNOSIS — J441 Chronic obstructive pulmonary disease with (acute) exacerbation: Secondary | ICD-10-CM

## 2020-07-19 DIAGNOSIS — Z9114 Patient's other noncompliance with medication regimen: Secondary | ICD-10-CM

## 2020-07-19 DIAGNOSIS — E119 Type 2 diabetes mellitus without complications: Secondary | ICD-10-CM

## 2020-07-19 DIAGNOSIS — F419 Anxiety disorder, unspecified: Secondary | ICD-10-CM

## 2020-07-19 DIAGNOSIS — F331 Major depressive disorder, recurrent, moderate: Secondary | ICD-10-CM | POA: Diagnosis not present

## 2020-07-19 NOTE — Progress Notes (Signed)
BP (!) 161/102   Pulse 75   Temp 97.8 F (36.6 C) (Oral)   Wt 257 lb (116.6 kg)   SpO2 95%   BMI 53.71 kg/m    Subjective:    Patient ID: Derek Cooley., male    DOB: 01-02-1966, 54 y.o.   MRN: [id number]  HPI: Derek Cooley. is a 54 y.o. male  Chief Complaint  Patient presents with  . Hypertension  . Shortness of Breath   Here today for 2 week f/u all conditions, previous visit was treated for COPD exacerbation and had extensive med review due to poor compliance and no longer allowing sister who has been primary caregiver for years partake in his care. No changes have been made since last visit regarding compliance with med regimen or lifestyle.   Only using his inhalers about 50% of the time, and not using his nebulizer at all. Some temporary benefit with prednisone taper. Recent CXR benign. Still having SOB, wheezing.   Not checking home BPs and not consistently taking his medications. Takes lisinopril, amlodipine just about every day he thinks, only takes the others sporadically. Denies CP, does have chronic SOB though this is likely due to his COPD condition.   Thinks he's on metformin at home still and not the glipizide, may be taking that sometimes. Not watching diet, sister states he eats junk food often. Denies low blood sugar spells. Not checking home sugars.   Following with Psychiatry for depression, anxiety - unsure if he's taking those medications consistently. Does feel he is significantly anxious and probably depressed most of the time.   Depression screen [facility] 2/9 07/19/2020 03/02/2020 11/18/2019  Decreased Interest 3 3 0  Down, Depressed, Hopeless 3 1 0  PHQ - 2 Score 6 4 0  Altered sleeping 3 3 -  Tired, decreased energy 3 3 -  Change in appetite 1 3 -  Feeling bad or failure about yourself  1 1 -  Trouble concentrating 2 3 -  Moving slowly or fidgety/restless 1 3 -  Suicidal thoughts 0 1 -  PHQ-9 Score 17 21 -  Difficult doing work/chores - - -    GAD 7 : Generalized Anxiety Score 06/06/2018  Nervous, Anxious, on Edge 3  Control/stop worrying 2  Worry too much - different things 3  Trouble relaxing 3  Restless 2  Easily annoyed or irritable 2  Afraid - awful might happen 3  Total GAD 7 Score 18  Anxiety Difficulty Very difficult     Relevant past medical, surgical, family and social history reviewed and updated as indicated. Interim medical history since our last visit reviewed. Allergies and medications reviewed and updated.  Review of Systems  Per HPI unless specifically indicated above     Objective:    BP (!) 161/102   Pulse 75   Temp 97.8 F (36.6 C) (Oral)   Wt 257 lb (116.6 kg)   SpO2 95%   BMI 53.71 kg/m   Wt Readings from Last 3 Encounters:  07/19/20 257 lb (116.6 kg)  06/29/20 265 lb (120.2 kg)  05/18/20 250 lb (113.4 kg)    Physical Exam Vitals and nursing note reviewed.  Constitutional:      Appearance: Normal appearance.  HENT:     Head: Atraumatic.  Eyes:     Extraocular Movements: Extraocular movements intact.     Conjunctiva/sclera: Conjunctivae normal.  Cardiovascular:     Rate and Rhythm: Normal rate and regular rhythm.  Pulmonary:  Effort: Pulmonary effort is normal.     Breath sounds: Normal breath sounds.     Comments: Diffuse wheezes, moderate Musculoskeletal:        General: Normal range of motion.     Cervical back: Normal range of motion and neck supple.  Skin:    General: Skin is warm and dry.  Neurological:     General: No focal deficit present.     Mental Status: He is oriented to person, place, and time.  Psychiatric:        Mood and Affect: Mood normal.        Thought Content: Thought content normal.        Judgment: Judgment normal.     Results for orders placed or performed in visit on 06/29/20  CBC with Differential/Platelet  Result Value Ref Range   WBC 9.5 3.4 - 10.8 x10E3/uL   RBC 5.25 4.14 - 5.80 x10E6/uL   Hemoglobin 16.0 13.0 - 17.7 g/dL    Hematocrit 47.0 37.5 - 51.0 %   MCV 90 79 - 97 fL   MCH 30.5 26.6 - 33.0 pg   MCHC 34.0 31 - 35 g/dL   RDW 14.6 11.6 - 15.4 %   Platelets 229 150 - 450 x10E3/uL   Neutrophils 53 Not Estab. %   Lymphs 32 Not Estab. %   Monocytes 9 Not Estab. %   Eos 4 Not Estab. %   Basos 1 Not Estab. %   Neutrophils Absolute 5.1 1 - 7 x10E3/uL   Lymphocytes Absolute 3.0 0 - 3 x10E3/uL   Monocytes Absolute 0.9 0 - 0 x10E3/uL   EOS (ABSOLUTE) 0.4 0.0 - 0.4 x10E3/uL   Basophils Absolute 0.1 0 - 0 x10E3/uL   Immature Granulocytes 1 Not Estab. %   Immature Grans (Abs) 0.1 0.0 - 0.1 x10E3/uL  Comprehensive metabolic panel  Result Value Ref Range   Glucose 276 (H) 65 - 99 mg/dL   BUN 15 6 - 24 mg/dL   Creatinine, Ser 1.41 (H) 0.76 - 1.27 mg/dL   GFR calc non Af Amer 56 (L) >59 mL/min/1.73   GFR calc Af Amer 65 >59 mL/min/1.73   BUN/Creatinine Ratio 11 9 - 20   Sodium 139 134 - 144 mmol/L   Potassium 4.4 3.5 - 5.2 mmol/L   Chloride 103 96 - 106 mmol/L   CO2 25 20 - 29 mmol/L   Calcium 9.5 8.7 - 10.2 mg/dL   Total Protein 6.7 6.0 - 8.5 g/dL   Albumin 4.3 3.8 - 4.9 g/dL   Globulin, Total 2.4 1.5 - 4.5 g/dL   Albumin/Globulin Ratio 1.8 1.2 - 2.2   Bilirubin Total 0.3 0.0 - 1.2 mg/dL   Alkaline Phosphatase 122 (H) 48 - 121 IU/L   AST 19 0 - 40 IU/L   ALT 27 0 - 44 IU/L  HgB A1c  Result Value Ref Range   Hgb A1c MFr Bld 8.6 (H) 4.8 - 5.6 %   Est. average glucose Bld gHb Est-mCnc 200 mg/dL  Lipid Panel w/o Chol/HDL Ratio  Result Value Ref Range   Cholesterol, Total 231 (H) 100 - 199 mg/dL   Triglycerides 478 (H) 0 - 149 mg/dL   HDL 35 (L) >39 mg/dL   VLDL Cholesterol Cal 83 (H) 5 - 40 mg/dL   LDL Chol Calc (NIH) 113 (H) 0 - 99 mg/dL  B Nat Peptide  Result Value Ref Range   BNP 16.1 0.0 - 100.0 pg/mL      Assessment &  Plan:   Problem List Items Addressed This Visit      Respiratory   COPD (chronic obstructive pulmonary disease) ([place])    Importance of inhaler, neb compliance reviewed.           Endocrine   Controlled type 2 diabetes mellitus without complication, without long-term current use of insulin ([place])    Will hold off on labs today as he's not sure what he's taking and when. Work on compliance, diet and exercise changes. Recheck 1 month        Genitourinary   Benign hypertensive renal disease    Extremely poor control, though it is unclear how and when he's taking his medications. Has had several hypotensive events in the recent past from increases in his medications when he is particularly compliant with his regimen so uncomfortable at this time making increases to his regimen until compliance with current regimen is achieved. Will work toward a conducive arrangement for care, compliance, and lifestyle changes and hopefully start monitoring home BP readings. F/u 1 month for recheck        Other   Depression    Following with Psychiatry, continue per their recommendations      Chronic anxiety - Primary    Followed by Psychiatry, continue per their recommendations       Other Visit Diagnoses    Non compliance w medication regimen       Long discussion w/ pt and sister regarding dire importance of compliance and close monitoring. SW referral placed to discuss care options   Relevant Orders   Referral to Chronic Care Management Services      50 minutes spent today in direct patient care, counseling, and coordination of care  Follow up plan: Return in about 4 weeks (around 08/16/2020) for Follow up .

## 2020-07-24 NOTE — Assessment & Plan Note (Signed)
Will hold off on labs today as he's not sure what he's taking and when. Work on compliance, diet and exercise changes. Recheck 1 month

## 2020-07-24 NOTE — Assessment & Plan Note (Signed)
Following with Psychiatry, continue per their recommendations

## 2020-07-24 NOTE — Assessment & Plan Note (Signed)
Extremely poor control, though it is unclear how and when he's taking his medications. Has had several hypotensive events in the recent past from increases in his medications when he is particularly compliant with his regimen so uncomfortable at this time making increases to his regimen until compliance with current regimen is achieved. Will work toward a conducive arrangement for care, compliance, and lifestyle changes and hopefully start monitoring home BP readings. F/u 1 month for recheck

## 2020-07-24 NOTE — Assessment & Plan Note (Signed)
Importance of inhaler, neb compliance reviewed.

## 2020-07-24 NOTE — Assessment & Plan Note (Signed)
Followed by Psychiatry, continue per their recommendations 

## 2020-08-01 ENCOUNTER — Telehealth: Payer: Self-pay

## 2020-08-01 ENCOUNTER — Telehealth: Payer: Self-pay | Admitting: Licensed Clinical Social Worker

## 2020-08-01 NOTE — Telephone Encounter (Signed)
Chronic Care Management    Clinical Social Work General Follow Up Note  08/01/2020 Name: [name] MRN: [id number] DOB: 12-17-1965  [name] is a 54 y.o. year old male who is a primary care patient of Volney American, [place]. The CCM team was consulted for assistance with [employer] .   Review of patient status, including review of consultants reports, relevant laboratory and other test results, and collaboration with appropriate care team members and the patient's provider was performed as part of comprehensive patient evaluation and provision of chronic care management services.    LCSW completed CCM outreach attempt today but was unable to reach patient successfully. A HIPPA compliant voice message was left encouraging patient to return call once available. LCSW will reschedule CCM SW appointment as well through [place].   Outpatient Encounter Medications as of 08/01/2020  Medication Sig Note  . albuterol (PROVENTIL) (2.5 MG/3ML) 0.083% nebulizer solution Take 3 mLs (2.5 mg total) by nebulization every 6 (six) hours as needed for wheezing or shortness of breath.   Marland Kitchen albuterol (VENTOLIN HFA) 108 (90 Base) MCG/ACT inhaler Inhale 1-2 puffs into the lungs every 6 (six) hours as needed for wheezing or shortness of breath.   Marland Kitchen amLODipine (NORVASC) 10 MG tablet TAKE ONE TABLET EVERY DAY   . Blood Glucose Monitoring Suppl (ONE TOUCH ULTRA 2) w/Device KIT  (Patient not taking: Reported on 06/29/2020)   . carvedilol (COREG) 12.5 MG tablet Take 1 tablet (12.5 mg total) by mouth 2 (two) times a day.   . docusate (COLACE) 60 MG/15ML syrup Take 100 mg by mouth 2 (two) times daily as needed. (Patient not taking: Reported on 06/29/2020)   . doxycycline (VIBRAMYCIN) 100 MG capsule TAKE 1 CAPSULE BY MOUTH TWICE DAILY   . DULoxetine (CYMBALTA) 60 MG capsule TAKE 1 CAPSULE TWICE DAILY   . fenofibrate micronized (LOFIBRA) 67 MG capsule TAKE 1 CAPSULE EVERY DAY BEFORE  BREAKFAST   . fluticasone (FLONASE) 50 MCG/ACT nasal spray Place 2 sprays into both nostrils daily.   . furosemide (LASIX) 20 MG tablet Take 1 tablet (20 mg total) by mouth daily.   Marland Kitchen glipiZIDE (GLUCOTROL XL) 5 MG 24 hr tablet TAKE ONE TABLET EACH MORNING WITH BREAKFAST   . hydrOXYzine (ATARAX/VISTARIL) 50 MG tablet TAKE ONE TABLET 3 TIMES DAILY AS NEEDED   . ibuprofen (ADVIL) 800 MG tablet Take 1 tablet (800 mg total) by mouth every 8 (eight) hours as needed.   . Lancets (ONETOUCH DELICA PLUS [id number]) [place]  (Patient not taking: Reported on 07/19/2020)   . lisinopril (ZESTRIL) 20 MG tablet TAKE 1 TABLET BY MOUTH ONCE DAILY   . methocarbamol (ROBAXIN) 750 MG tablet Take 1,500 mg by mouth 4 (four) times daily. 03/04/2020: As needed  . ONETOUCH ULTRA test strip  (Patient not taking: Reported on 06/29/2020)   . pantoprazole (PROTONIX) 40 MG tablet Take 1 tablet (40 mg total) by mouth 2 (two) times daily as needed.   . pregabalin (LYRICA) 50 MG capsule Take 50 mg by mouth 4 (four) times daily.   Marland Kitchen tiZANidine (ZANAFLEX) 4 MG tablet TAKE ONE TABLET TWICE DAILY AS NEEDED FOR MUSCLE SPASM 02/24/2020: Taking five times daily   . topiramate (TOPAMAX) 50 MG tablet 50 mg 2 (two) times daily.    . traZODone (DESYREL) 100 MG tablet Take 100 mg by mouth at bedtime.   Marland Kitchen umeclidinium-vilanterol (ANORO ELLIPTA) 62.5-25 MCG/INH AEPB Inhale 1 puff into the lungs daily.  No facility-administered encounter medications on file as of 08/01/2020.   Follow Up Plan: [place] will reach out to patient to reschedule appointment.   Eula Fried, BSW, MSW, [facility] Care Management [email or web address] Phone: [phone]

## 2020-08-04 NOTE — Telephone Encounter (Signed)
Pt has been r/s for 08/29/2020

## 2020-08-10 ENCOUNTER — Telehealth: Payer: Self-pay

## 2020-08-17 ENCOUNTER — Ambulatory Visit: Payer: Medicare Other | Admitting: Family Medicine

## 2020-08-19 ENCOUNTER — Telehealth: Payer: Self-pay

## 2020-08-23 ENCOUNTER — Other Ambulatory Visit: Payer: Self-pay | Admitting: Family Medicine

## 2020-08-23 NOTE — Telephone Encounter (Signed)
Requested medication (s) are due for refill today: Yes  Requested medication (s) are on the active medication list: Yes  Last refill:  08/21/19  Future visit scheduled: No  Notes to clinic:  Prescription has expired. Historical provider.    Requested Prescriptions  Pending Prescriptions Disp Refills   ONETOUCH ULTRA test strip [Pharmacy Med Name: ONETOUCH ULTRA STRIP] 100 each     Sig: USE TO TEST BLOOD SUGAR DAILY      Endocrinology: Diabetes - Testing Supplies Passed - 08/23/2020 11:37 AM      Passed - Valid encounter within last 12 months    Recent Outpatient Visits           1 month ago Chronic anxiety   [place], [place], [place]   1 month ago Benign hypertensive renal disease   [place], [place]   5 months ago Somnolence   [place], [place]   7 months ago Oral pain   [place], [place]   8 months ago Hypertension associated with diabetes [facility])   [place], [place], [place]

## 2020-08-29 ENCOUNTER — Telehealth: Payer: Medicare Other

## 2020-08-29 ENCOUNTER — Telehealth: Payer: Self-pay | Admitting: Licensed Clinical Social Worker

## 2020-08-29 NOTE — Telephone Encounter (Signed)
Chronic Care Management    Clinical Social Work General Follow Up Note  08/29/2020 Name: Derek Cooley. MRN: [id number] DOB: 08/04/1966  Derek Cooley. is a 54 y.o. year old male who is a primary care patient of Volney American, [place]. The CCM team was consulted for assistance with [employer] .   Review of patient status, including review of consultants reports, relevant laboratory and other test results, and collaboration with appropriate care team members and the patient's provider was performed as part of comprehensive patient evaluation and provision of chronic care management services.    LCSW completed CCM outreach attempt today but was unable to reach patient successfully. A HIPPA compliant voice message was left encouraging patient to return call once available. LCSW will ask Scheduling Care Guide to reschedule CCM SW appointment with patient as well.   Outpatient Encounter Medications as of 08/29/2020  Medication Sig Note   albuterol (PROVENTIL) (2.5 MG/3ML) 0.083% nebulizer solution Take 3 mLs (2.5 mg total) by nebulization every 6 (six) hours as needed for wheezing or shortness of breath.    albuterol (VENTOLIN HFA) 108 (90 Base) MCG/ACT inhaler Inhale 1-2 puffs into the lungs every 6 (six) hours as needed for wheezing or shortness of breath.    amLODipine (NORVASC) 10 MG tablet TAKE ONE TABLET EVERY DAY    Blood Glucose Monitoring Suppl (ONE TOUCH ULTRA 2) w/Device KIT  (Patient not taking: Reported on 06/29/2020)    carvedilol (COREG) 12.5 MG tablet Take 1 tablet (12.5 mg total) by mouth 2 (two) times a day.    docusate (COLACE) 60 MG/15ML syrup Take 100 mg by mouth 2 (two) times daily as needed. (Patient not taking: Reported on 06/29/2020)    doxycycline (VIBRAMYCIN) 100 MG capsule TAKE 1 CAPSULE BY MOUTH TWICE DAILY    DULoxetine (CYMBALTA) 60 MG capsule TAKE 1 CAPSULE TWICE DAILY    fenofibrate micronized (LOFIBRA) 67 MG capsule TAKE 1 CAPSULE  EVERY DAY BEFORE BREAKFAST    fluticasone (FLONASE) 50 MCG/ACT nasal spray Place 2 sprays into both nostrils daily.    furosemide (LASIX) 20 MG tablet Take 1 tablet (20 mg total) by mouth daily.    glipiZIDE (GLUCOTROL XL) 5 MG 24 hr tablet TAKE ONE TABLET EACH MORNING WITH BREAKFAST    glucose blood (ONETOUCH ULTRA) test strip USE TO TEST BLOOD SUGAR DAILY    hydrOXYzine (ATARAX/VISTARIL) 50 MG tablet TAKE ONE TABLET 3 TIMES DAILY AS NEEDED    ibuprofen (ADVIL) 800 MG tablet Take 1 tablet (800 mg total) by mouth every 8 (eight) hours as needed.    Lancets (ONETOUCH DELICA PLUS [id number]) [place]  (Patient not taking: Reported on 07/19/2020)    lisinopril (ZESTRIL) 20 MG tablet TAKE 1 TABLET BY MOUTH ONCE DAILY    methocarbamol (ROBAXIN) 750 MG tablet Take 1,500 mg by mouth 4 (four) times daily. 03/04/2020: As needed   pantoprazole (PROTONIX) 40 MG tablet Take 1 tablet (40 mg total) by mouth 2 (two) times daily as needed.    pregabalin (LYRICA) 50 MG capsule Take 50 mg by mouth 4 (four) times daily.    tiZANidine (ZANAFLEX) 4 MG tablet TAKE ONE TABLET TWICE DAILY AS NEEDED FOR MUSCLE SPASM 02/24/2020: Taking five times daily    topiramate (TOPAMAX) 50 MG tablet 50 mg 2 (two) times daily.     traZODone (DESYREL) 100 MG tablet Take 100 mg by mouth at bedtime.    umeclidinium-vilanterol (ANORO ELLIPTA) 62.5-25 MCG/INH AEPB Inhale 1 puff into the  lungs daily.    No facility-administered encounter medications on file as of 08/29/2020.    Follow Up Plan: [place] will reach out to patient to reschedule appointment.   Eula Fried, BSW, MSW, [facility] Care Management [email or web address] Phone: [phone]

## 2020-09-07 ENCOUNTER — Telehealth: Payer: Self-pay

## 2020-09-12 NOTE — Telephone Encounter (Signed)
Pt has been r/s  

## 2020-09-27 ENCOUNTER — Telehealth: Payer: Self-pay | Admitting: General Practice

## 2020-09-27 ENCOUNTER — Telehealth: Payer: Self-pay

## 2020-09-27 NOTE — Telephone Encounter (Signed)
  Chronic Care Management   Outreach Note  09/27/2020 Name: Derek Cooley. MRN: [id number] DOB: 1966-01-01  Referred by: Volney American, PA-C Reason for referral : Chronic Care Management (RNCM Initial Outreach for Chronic Disease Management and Care Coordination Needs)   An unsuccessful telephone outreach was attempted today. The patient was referred to the case management team for assistance with care management and care coordination.   Follow Up Plan: A HIPAA compliant phone message was left for the patient providing contact information and requesting a return call.   Noreene Larsson RN, MSN, [facility] Mobile: [phone]

## 2020-10-26 ENCOUNTER — Ambulatory Visit: Payer: Medicare Other | Admitting: Family Medicine

## 2020-11-02 ENCOUNTER — Telehealth: Payer: Medicare Other

## 2020-11-18 ENCOUNTER — Ambulatory Visit: Payer: Medicare Other

## 2020-11-21 ENCOUNTER — Telehealth: Payer: Medicare Other

## 2020-11-21 ENCOUNTER — Telehealth: Payer: Self-pay | Admitting: Licensed Clinical Social Worker

## 2020-11-21 NOTE — Telephone Encounter (Addendum)
Chronic Care Management    Clinical Social Work General Follow Up Note  11/21/2020 Name: Derek Cooley. MRN: [id number] DOB: [birth year]  Derek Cooley. is a 54 y.o. year old male who is a primary care patient of Volney American, [place]. The CCM team was consulted for assistance with [employer] .   Review of patient status, including review of consultants reports, relevant laboratory and other test results, and collaboration with appropriate care team members and the patient's provider was performed as part of comprehensive patient evaluation and provision of chronic care management services.    LCSW completed CCM outreach attempt today but was unable to reach patient successfully. A HIPPA compliant voice message was left encouraging patient to return call once available. LCSW has completed three unsuccessful outreaches at this time and will close referral.   Outpatient Encounter Medications as of 11/21/2020  Medication Sig Note  . albuterol (PROVENTIL) (2.5 MG/3ML) 0.083% nebulizer solution Take 3 mLs (2.5 mg total) by nebulization every 6 (six) hours as needed for wheezing or shortness of breath.   Marland Kitchen albuterol (VENTOLIN HFA) 108 (90 Base) MCG/ACT inhaler Inhale 1-2 puffs into the lungs every 6 (six) hours as needed for wheezing or shortness of breath.   Marland Kitchen amLODipine (NORVASC) 10 MG tablet TAKE ONE TABLET EVERY DAY   . Blood Glucose Monitoring Suppl (ONE TOUCH ULTRA 2) w/Device KIT  (Patient not taking: Reported on 06/29/2020)   . carvedilol (COREG) 12.5 MG tablet Take 1 tablet (12.5 mg total) by mouth 2 (two) times a day.   . docusate (COLACE) 60 MG/15ML syrup Take 100 mg by mouth 2 (two) times daily as needed. (Patient not taking: Reported on 06/29/2020)   . doxycycline (VIBRAMYCIN) 100 MG capsule TAKE 1 CAPSULE BY MOUTH TWICE DAILY   . DULoxetine (CYMBALTA) 60 MG capsule TAKE 1 CAPSULE TWICE DAILY   . fenofibrate micronized (LOFIBRA) 67 MG capsule TAKE 1 CAPSULE EVERY  DAY BEFORE BREAKFAST   . fluticasone (FLONASE) 50 MCG/ACT nasal spray Place 2 sprays into both nostrils daily.   . furosemide (LASIX) 20 MG tablet Take 1 tablet (20 mg total) by mouth daily.   Marland Kitchen glipiZIDE (GLUCOTROL XL) 5 MG 24 hr tablet TAKE ONE TABLET EACH MORNING WITH BREAKFAST   . glucose blood (ONETOUCH ULTRA) test strip USE TO TEST BLOOD SUGAR DAILY   . hydrOXYzine (ATARAX/VISTARIL) 50 MG tablet TAKE ONE TABLET 3 TIMES DAILY AS NEEDED   . ibuprofen (ADVIL) 800 MG tablet Take 1 tablet (800 mg total) by mouth every 8 (eight) hours as needed.   . Lancets (ONETOUCH DELICA PLUS [id number]) [place]  (Patient not taking: Reported on 07/19/2020)   . lisinopril (ZESTRIL) 20 MG tablet TAKE 1 TABLET BY MOUTH ONCE DAILY   . methocarbamol (ROBAXIN) 750 MG tablet Take 1,500 mg by mouth 4 (four) times daily. 03/04/2020: As needed  . pantoprazole (PROTONIX) 40 MG tablet Take 1 tablet (40 mg total) by mouth 2 (two) times daily as needed.   . pregabalin (LYRICA) 50 MG capsule Take 50 mg by mouth 4 (four) times daily.   Marland Kitchen tiZANidine (ZANAFLEX) 4 MG tablet TAKE ONE TABLET TWICE DAILY AS NEEDED FOR MUSCLE SPASM 02/24/2020: Taking five times daily   . topiramate (TOPAMAX) 50 MG tablet 50 mg 2 (two) times daily.    . traZODone (DESYREL) 100 MG tablet Take 100 mg by mouth at bedtime.   Marland Kitchen umeclidinium-vilanterol (ANORO ELLIPTA) 62.5-25 MCG/INH AEPB Inhale 1 puff into the lungs daily.  No facility-administered encounter medications on file as of 11/21/2020.    Follow Up Plan: LCSW has been unable to make contact with the patient for follow up. The care management team is available to follow up with the patient after provider conversation with the patient regarding recommendation for care management engagement and subsequent re-referral to the care management team.    Eula Fried, BSW, MSW, [email or web address] Phone:  [phone]

## 2020-11-23 ENCOUNTER — Ambulatory Visit: Payer: Medicare Other | Admitting: Family Medicine

## 2021-01-10 ENCOUNTER — Telehealth: Payer: Self-pay | Admitting: Nurse Practitioner

## 2021-01-10 NOTE — Telephone Encounter (Signed)
Copied from [place] [id number]. Topic: Medicare AWV >> Jan 10, 2021  3:12 PM Cher Nakai R wrote: Reason for CRM:  Left message for patient to call back and schedule the Medicare Annual Wellness Visit (AWV) virtually or by telephone.  Last AWV 11/18/2019  Please schedule at anytime with C[facility]-Nurse Health Advisor.  45 minute appointment  Any questions, please call me at [phone]

## 2021-01-19 ENCOUNTER — Ambulatory Visit: Payer: Self-pay | Admitting: Nurse Practitioner

## 2021-01-26 ENCOUNTER — Telehealth: Payer: Self-pay | Admitting: Nurse Practitioner

## 2021-01-26 NOTE — Telephone Encounter (Signed)
Copied from [place] [id number]. Topic: Medicare AWV >> Jan 26, 2021 10:39 AM Cher Nakai R wrote: Reason for CRM:   Left message for patient to call back and schedule the Medicare Annual Wellness Visit (AWV) virtually or by telephone.  Last AWV 11/18/2019  Please schedule at anytime with CFP-Nurse Health Advisor.  45 minute appointment  Any questions, please call me at [phone]

## 2021-04-17 ENCOUNTER — Other Ambulatory Visit: Payer: Self-pay

## 2021-04-17 ENCOUNTER — Ambulatory Visit (INDEPENDENT_AMBULATORY_CARE_PROVIDER_SITE_OTHER): Payer: Medicare Other | Admitting: Nurse Practitioner

## 2021-04-17 ENCOUNTER — Encounter: Payer: Self-pay | Admitting: Nurse Practitioner

## 2021-04-17 VITALS — BP 178/107 | HR 96 | Temp 97.3°F | Ht 67.0 in | Wt 219.1 lb

## 2021-04-17 DIAGNOSIS — E119 Type 2 diabetes mellitus without complications: Secondary | ICD-10-CM

## 2021-04-17 DIAGNOSIS — E1159 Type 2 diabetes mellitus with other circulatory complications: Secondary | ICD-10-CM | POA: Diagnosis not present

## 2021-04-17 DIAGNOSIS — I152 Hypertension secondary to endocrine disorders: Secondary | ICD-10-CM

## 2021-04-17 DIAGNOSIS — L02211 Cutaneous abscess of abdominal wall: Secondary | ICD-10-CM

## 2021-04-17 DIAGNOSIS — F331 Major depressive disorder, recurrent, moderate: Secondary | ICD-10-CM | POA: Diagnosis not present

## 2021-04-17 DIAGNOSIS — J441 Chronic obstructive pulmonary disease with (acute) exacerbation: Secondary | ICD-10-CM | POA: Diagnosis not present

## 2021-04-17 DIAGNOSIS — I671 Cerebral aneurysm, nonruptured: Secondary | ICD-10-CM

## 2021-04-17 DIAGNOSIS — F419 Anxiety disorder, unspecified: Secondary | ICD-10-CM

## 2021-04-17 DIAGNOSIS — I129 Hypertensive chronic kidney disease with stage 1 through stage 4 chronic kidney disease, or unspecified chronic kidney disease: Secondary | ICD-10-CM

## 2021-04-17 DIAGNOSIS — N182 Chronic kidney disease, stage 2 (mild): Secondary | ICD-10-CM

## 2021-04-17 DIAGNOSIS — Z202 Contact with and (suspected) exposure to infections with a predominantly sexual mode of transmission: Secondary | ICD-10-CM

## 2021-04-17 DIAGNOSIS — R8290 Unspecified abnormal findings in urine: Secondary | ICD-10-CM

## 2021-04-17 MED ORDER — FENOFIBRATE MICRONIZED 67 MG PO CAPS: ORAL_CAPSULE | ORAL | 0 refills | Status: DC

## 2021-04-17 MED ORDER — GLIPIZIDE ER 5 MG PO TB24: ORAL_TABLET | ORAL | 0 refills | Status: DC

## 2021-04-17 MED ORDER — DULOXETINE HCL 60 MG PO CPEP: 60.0000 mg | ORAL_CAPSULE | Freq: Two times a day (BID) | ORAL | 0 refills | Status: DC

## 2021-04-17 MED ORDER — CARVEDILOL 12.5 MG PO TABS: 12.5000 mg | ORAL_TABLET | Freq: Two times a day (BID) | ORAL | 0 refills | Status: DC

## 2021-04-17 MED ORDER — HYDROXYZINE HCL 50 MG PO TABS: ORAL_TABLET | ORAL | 1 refills | Status: DC

## 2021-04-17 MED ORDER — PANTOPRAZOLE SODIUM 40 MG PO TBEC: 40.0000 mg | DELAYED_RELEASE_TABLET | Freq: Two times a day (BID) | ORAL | 2 refills | Status: DC | PRN

## 2021-04-17 MED ORDER — ALBUTEROL SULFATE HFA 108 (90 BASE) MCG/ACT IN AERS: 1.0000 | INHALATION_SPRAY | Freq: Four times a day (QID) | RESPIRATORY_TRACT | 3 refills | Status: DC | PRN

## 2021-04-17 MED ORDER — AMLODIPINE BESYLATE 10 MG PO TABS: 1.0000 | ORAL_TABLET | Freq: Every day | ORAL | 0 refills | Status: DC

## 2021-04-17 MED ORDER — LISINOPRIL 20 MG PO TABS: 1.0000 | ORAL_TABLET | Freq: Every day | ORAL | 0 refills | Status: DC

## 2021-04-17 MED ORDER — ANORO ELLIPTA 62.5-25 MCG/INH IN AEPB: 1.0000 | INHALATION_SPRAY | Freq: Every day | RESPIRATORY_TRACT | 2 refills | Status: DC

## 2021-04-17 NOTE — Progress Notes (Signed)
BP (!) 178/107   Pulse 96   Temp (!) 97.3 F (36.3 C)   Ht '5\' 7"'  (1.702 m)   Wt 219 lb 2 oz (99.4 kg)   SpO2 99%   BMI 34.32 kg/m    Subjective:    Patient ID: Derek Cooley., male    DOB: May 17, 1966, 55 y.o.   MRN: [id number]  HPI: Derek Cooley. is a 55 y.o. male  Chief Complaint  Patient presents with  . Exposure to STD    Patient would like to be check for all STD including HIV and Hep C   . Hypertension  . Referral    Infectious disease, headache doctor, and stroke doctor  . Urinary Incontinence   HYPERTENSION / HYPERLIPIDEMIA Patient has been out of medication.  Satisfied with current treatment? no Duration of hypertension: years BP monitoring frequency: not checking BP range:  BP medication side effects: no Past BP meds: Patient has been out of medication Duration of hyperlipidemia: years Cholesterol medication side effects: no Cholesterol supplements: none Past cholesterol medications: none Medication compliance: poor compliance Aspirin: no Recent stressors: no Recurrent headaches: no Visual changes: yes Palpitations: no Dyspnea: no Chest pain: no Lower extremity edema: no Dizzy/lightheaded: no    DIABETES Hypoglycemic episodes:no Polydipsia/polyuria: yes Visual disturbance: sometimes blurry Chest pain: no Paresthesias: yes Glucose Monitoring: no  Accucheck frequency: Not Checking  Fasting glucose:  Post prandial:  Evening:  Before meals: Taking Insulin?: no  Long acting insulin:  Short acting insulin: Blood Pressure Monitoring: not checking Retinal Examination: Not up to Date Foot Exam: Not up to Date Diabetic Education: Not Completed Pneumovax: Up to Date Influenza: Not up to Date Aspirin: no  COPD Patient has been out of medication.  COPD status: stable Satisfied with current treatment?: yes Oxygen use: no Dyspnea frequency:  Cough frequency:  Rescue inhaler frequency:   Limitation of activity: yes Productive  cough:  Last Spirometry:  Pneumovax: Up to Date Influenza: unknown  DEPRESSION/ANXIETY Patient states it has been rough lately. Patient has been out of medication. Denies SI.   Relevant past medical, surgical, family and social history reviewed and updated as indicated. Interim medical history since our last visit reviewed. Allergies and medications reviewed and updated.  Review of Systems  Eyes: Positive for visual disturbance.  Respiratory: Positive for shortness of breath. Negative for chest tightness.   Cardiovascular: Negative for chest pain, palpitations and leg swelling.  Endocrine: Positive for polyuria. Negative for polydipsia.  Neurological: Positive for numbness. Negative for dizziness, light-headedness and headaches.    Per HPI unless specifically indicated above     Objective:    BP (!) 178/107   Pulse 96   Temp (!) 97.3 F (36.3 C)   Ht '5\' 7"'  (1.702 m)   Wt 219 lb 2 oz (99.4 kg)   SpO2 99%   BMI 34.32 kg/m   Wt Readings from Last 3 Encounters:  04/17/21 219 lb 2 oz (99.4 kg)  07/19/20 257 lb (116.6 kg)  06/29/20 265 lb (120.2 kg)    Physical Exam Vitals and nursing note reviewed.  Constitutional:      General: He is not in acute distress.    Appearance: Normal appearance. He is not ill-appearing, toxic-appearing or diaphoretic.  HENT:     Head: Normocephalic.     Right Ear: External ear normal.     Left Ear: External ear normal.     Nose: Nose normal. No congestion or rhinorrhea.  Mouth/Throat:     Mouth: Mucous membranes are moist.  Eyes:     General:        Right eye: No discharge.        Left eye: No discharge.     Extraocular Movements: Extraocular movements intact.     Conjunctiva/sclera: Conjunctivae normal.     Pupils: Pupils are equal, round, and reactive to light.  Cardiovascular:     Rate and Rhythm: Normal rate and regular rhythm.     Heart sounds: No murmur heard.   Pulmonary:     Effort: Pulmonary effort is normal. No  respiratory distress.     Breath sounds: Decreased air movement present. Decreased breath sounds present. No wheezing, rhonchi or rales.  Abdominal:     General: Abdomen is flat. Bowel sounds are normal.  Musculoskeletal:     Cervical back: Normal range of motion and neck supple.  Skin:    General: Skin is warm and dry.     Capillary Refill: Capillary refill takes less than 2 seconds.  Neurological:     General: No focal deficit present.     Mental Status: He is alert and oriented to person, place, and time.  Psychiatric:        Mood and Affect: Mood normal.        Behavior: Behavior normal.        Thought Content: Thought content normal.        Judgment: Judgment normal.     Results for orders placed or performed in visit on 04/17/21  Microscopic Examination   Urine  Result Value Ref Range   WBC, UA 0-5 0 - 5 /hpf   RBC None seen 0 - 2 /hpf   Epithelial Cells (non renal) 0-10 0 - 10 /hpf   Bacteria, UA Few (A) None seen/Few  Lipid panel  Result Value Ref Range   Cholesterol, Total 161 100 - 199 mg/dL   Triglycerides 187 (H) 0 - 149 mg/dL   HDL 24 (L) >39 mg/dL   VLDL Cholesterol Cal 33 5 - 40 mg/dL   LDL Chol Calc (NIH) 104 (H) 0 - 99 mg/dL   Chol/HDL Ratio 6.7 (H) 0.0 - 5.0 ratio  Basic metabolic panel  Result Value Ref Range   Glucose 208 (H) 65 - 99 mg/dL   BUN 27 (H) 6 - 24 mg/dL   Creatinine, Ser 1.95 (H) 0.76 - 1.27 mg/dL   eGFR 40 (L) >59 mL/min/1.73   BUN/Creatinine Ratio 14 9 - 20   Sodium 142 134 - 144 mmol/L   Potassium 4.9 3.5 - 5.2 mmol/L   Chloride 103 96 - 106 mmol/L   CO2 22 20 - 29 mmol/L   Calcium 9.2 8.7 - 10.2 mg/dL  Urinalysis, Routine w reflex microscopic  Result Value Ref Range   Specific Gravity, UA 1.015 1.005 - 1.030   pH, UA 5.5 5.0 - 7.5   Color, UA Yellow Yellow   Appearance Ur Clear Clear   Leukocytes,UA Trace (A) Negative   Protein,UA 2+ (A) Negative/Trace   Glucose, UA Negative Negative   Ketones, UA Negative Negative   RBC,  UA Trace (A) Negative   Bilirubin, UA Negative Negative   Urobilinogen, Ur 0.2 0.2 - 1.0 mg/dL   Nitrite, UA Negative Negative   Microscopic Examination See below:   HgB A1c  Result Value Ref Range   Hgb A1c MFr Bld 6.8 (H) 4.8 - 5.6 %   Est. average glucose Bld gHb Est-mCnc 148  mg/dL  RPR  Result Value Ref Range   RPR Ser Ql Non Reactive Non Reactive  HIV Antibody (routine testing w rflx)  Result Value Ref Range   HIV Screen 4th Generation wRfx Non Reactive Non Reactive  Hepatitis C Antibody  Result Value Ref Range   Hep C Virus Ab <0.1 0.0 - 0.9 s/co ratio  HSV(herpes smplx)abs-1+2(IgG+IgM)-bld  Result Value Ref Range   HSVI/II Comb IgM WILL FOLLOW    HSV 1 Glycoprotein G Ab, IgG 50.40 (H) 0.00 - 0.90 index   HSV 2 IgG, Type Spec <0.91 0.00 - 0.90 index      Assessment & Plan:   Problem List Items Addressed This Visit      Cardiovascular and Mediastinum   Hypertension associated with diabetes ([place]) - Primary    Restart medication. Will follow up in 2 weeks. Labs drawn today.       Relevant Medications   amLODipine (NORVASC) 10 MG tablet   carvedilol (COREG) 12.5 MG tablet   fenofibrate micronized (LOFIBRA) 67 MG capsule   glipiZIDE (GLUCOTROL XL) 5 MG 24 hr tablet   lisinopril (ZESTRIL) 20 MG tablet   Other Relevant Orders   Lipid panel (Completed)   Basic metabolic panel (Completed)   Urinalysis, Routine w reflex microscopic (Completed)   HgB A1c (Completed)   Cerebral aneurysm    Referral placed for neurology due to patient's history. Recommend following up with them.      Relevant Medications   amLODipine (NORVASC) 10 MG tablet   carvedilol (COREG) 12.5 MG tablet   fenofibrate micronized (LOFIBRA) 67 MG capsule   lisinopril (ZESTRIL) 20 MG tablet   Other Relevant Orders   Ambulatory referral to Neurology     Respiratory   COPD (chronic obstructive pulmonary disease) ([facility])    Chronic.  Not controlled.  Refills sent of Anoro and Albuterol. Follow up in 2  weeks.       Relevant Medications   albuterol (VENTOLIN HFA) 108 (90 Base) MCG/ACT inhaler   umeclidinium-vilanterol (ANORO ELLIPTA) 62.5-25 MCG/INH AEPB   Other Relevant Orders   Lipid panel (Completed)   Basic metabolic panel (Completed)   Urinalysis, Routine w reflex microscopic (Completed)   HgB A1c (Completed)     Endocrine   Controlled type 2 diabetes mellitus without complication, without long-term current use of insulin ([place])    Labs ordered today.  A1c came back at 6.8.  Will stop Glipizide due to risk of hypoglycemia. Follow up in 2 weeks.       Relevant Medications   glipiZIDE (GLUCOTROL XL) 5 MG 24 hr tablet   lisinopril (ZESTRIL) 20 MG tablet   Other Relevant Orders   Lipid panel (Completed)   Basic metabolic panel (Completed)   Urinalysis, Routine w reflex microscopic (Completed)   HgB A1c (Completed)     Genitourinary   Benign hypertensive renal disease    Restart medication. Will follow up in 2 weeks. Labs drawn today.       Relevant Medications   amLODipine (NORVASC) 10 MG tablet   CKD (chronic kidney disease) stage 2, GFR 60-89 ml/min    Chronic.  Stable.  Labs ordered today.  Will restart medications. Continue to avoid NSAIDS.        Other   Depression    Chronic.  Not well controlled.  Restart medication.  Denies SI. Follow up in 2 weeks for reevaluation.       Relevant Medications   DULoxetine (CYMBALTA) 60 MG capsule  hydrOXYzine (ATARAX/VISTARIL) 50 MG tablet   Chronic anxiety    Chronic.  Not well controlled.  Restart medication.  Denies SI. Follow up in 2 weeks for reevaluation.       Relevant Medications   DULoxetine (CYMBALTA) 60 MG capsule   hydrOXYzine (ATARAX/VISTARIL) 50 MG tablet   Abdominal wall abscess    Referral placed for ID.  Patient needs to follow up with them to resume Doxycycline.      Relevant Orders   Ambulatory referral to Infectious Disease    Other Visit Diagnoses    Exposure to STD       Labs ordered today.  Will make recommendations based on lab results.    Relevant Orders   Chlamydia/Gonococcus/Trichomonas, NAA([facility])   RPR (Completed)   HIV Antibody (routine testing w rflx) (Completed)   Hepatitis C Antibody (Completed)   HSV(herpes smplx)abs-1+2(IgG+IgM)-bld (Completed)   Abnormal urinalysis       Urine sent for culture.   Relevant Orders   Urine Culture       Follow up plan: Return in about 2 weeks (around 05/01/2021) for Depression/Anxiety FU, BP Check.

## 2021-04-18 ENCOUNTER — Encounter: Payer: Self-pay | Admitting: Nurse Practitioner

## 2021-04-18 LAB — URINALYSIS, ROUTINE W REFLEX MICROSCOPIC
Bilirubin, UA: NEGATIVE
Glucose, UA: NEGATIVE
Ketones, UA: NEGATIVE
Nitrite, UA: NEGATIVE
Specific Gravity, UA: 1.015 (ref 1.005–1.030)
Urobilinogen, Ur: 0.2 mg/dL (ref 0.2–1.0)
pH, UA: 5.5 (ref 5.0–7.5)

## 2021-04-18 LAB — MICROSCOPIC EXAMINATION: RBC, Urine: NONE SEEN /hpf (ref 0–2)

## 2021-04-18 NOTE — Assessment & Plan Note (Signed)
Referral placed for ID.  Patient needs to follow up with them to resume Doxycycline.

## 2021-04-18 NOTE — Assessment & Plan Note (Signed)
Chronic.  Stable.  Labs ordered today.  Will restart medications. Continue to avoid NSAIDS.

## 2021-04-18 NOTE — Assessment & Plan Note (Signed)
Restart medication. Will follow up in 2 weeks. Labs drawn today.

## 2021-04-18 NOTE — Assessment & Plan Note (Signed)
Chronic.  Not well controlled.  Restart medication.  Denies SI. Follow up in 2 weeks for reevaluation.

## 2021-04-18 NOTE — Assessment & Plan Note (Signed)
Referral placed for neurology due to patient's history. Recommend following up with them.

## 2021-04-18 NOTE — Assessment & Plan Note (Signed)
Chronic.  Not controlled.  Refills sent of Anoro and Albuterol. Follow up in 2 weeks.

## 2021-04-18 NOTE — Assessment & Plan Note (Signed)
Labs ordered today.  A1c came back at 6.8.  Will stop Glipizide due to risk of hypoglycemia. Follow up in 2 weeks.

## 2021-04-18 NOTE — Progress Notes (Signed)
Hi Derek Cooley.  Your lab work came back and is improved from prior.  Your cholesterol is much improved from prior.  Restart your current medication regimen and it will continue to improve. Your A1c also improved from 8.6 to 6.8.  This is likely due to your weight loss.  We will stop the glipizide so you do not have any episodes of hypoglycemia.  We will continue to check at future visits.    Your Kidney function decreased a little bit but is consistent with years past.  We will continue to monitor this and being back on your medication should help also.   You are negative for HIV, Hep C, Syphilis, and genital herpes.  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  Let me know if you have any questions about that. I am still waiting for the Gonorrhea, Chlamydia, and trichomonas to come back.  That typically takes a little longer.    Your urine was slightly abnormal with protein but this is to be expected with your decreased kidney function. I will see you at our next visit.

## 2021-04-19 LAB — CHLAMYDIA/GONOCOCCUS/TRICHOMONAS, NAA
Chlamydia by NAA: NEGATIVE
Gonococcus by NAA: NEGATIVE
Trich vag by NAA: NEGATIVE

## 2021-04-19 LAB — LIPID PANEL
Chol/HDL Ratio: 6.7 ratio — ABNORMAL HIGH (ref 0.0–5.0)
Cholesterol, Total: 161 mg/dL (ref 100–199)
HDL: 24 mg/dL — ABNORMAL LOW (ref >39)
LDL Chol Calc (NIH): 104 mg/dL — ABNORMAL HIGH (ref 0–99)
Triglycerides: 187 mg/dL — ABNORMAL HIGH (ref 0–149)
VLDL Cholesterol Cal: 33 mg/dL (ref 5–40)

## 2021-04-19 LAB — HEMOGLOBIN A1C
Est. average glucose Bld gHb Est-mCnc: 148 mg/dL
Hgb A1c MFr Bld: 6.8 % — ABNORMAL HIGH (ref 4.8–5.6)

## 2021-04-19 LAB — HEPATITIS C ANTIBODY: Hep C Virus Ab: <0.1 s/co ratio (ref 0.0–0.9)

## 2021-04-19 LAB — HSV(HERPES SMPLX)ABS-I+II(IGG+IGM)-BLD
HSV 1 Glycoprotein G Ab, IgG: 50.4 index — ABNORMAL HIGH (ref 0.00–0.90)
HSV 2 IgG, Type Spec: <0.91 index (ref 0.00–0.90)
HSVI/II Comb IgM: <0.91 Ratio (ref 0.00–0.90)

## 2021-04-19 LAB — BASIC METABOLIC PANEL
BUN/Creatinine Ratio: 14 (ref 9–20)
BUN: 27 mg/dL — ABNORMAL HIGH (ref 6–24)
CO2: 22 mmol/L (ref 20–29)
Calcium: 9.2 mg/dL (ref 8.7–10.2)
Chloride: 103 mmol/L (ref 96–106)
Creatinine, Ser: 1.95 mg/dL — ABNORMAL HIGH (ref 0.76–1.27)
Glucose: 208 mg/dL — ABNORMAL HIGH (ref 65–99)
Potassium: 4.9 mmol/L (ref 3.5–5.2)
Sodium: 142 mmol/L (ref 134–144)
eGFR: 40 mL/min/{1.73_m2} — ABNORMAL LOW (ref >59)

## 2021-04-19 LAB — RPR: RPR Ser Ql: NONREACTIVE

## 2021-04-19 LAB — HIV ANTIBODY (ROUTINE TESTING W REFLEX): HIV Screen 4th Generation wRfx: NONREACTIVE

## 2021-05-01 ENCOUNTER — Ambulatory Visit: Payer: Medicare Other | Admitting: Nurse Practitioner

## 2021-05-10 ENCOUNTER — Ambulatory Visit: Payer: Medicare Other | Admitting: Nurse Practitioner

## 2021-05-10 NOTE — Progress Notes (Deleted)
There were no vitals taken for this visit.   Subjective:    Patient ID: Derek Cooley., male    DOB: August 07, 1966, 55 y.o.   MRN: [id number]  HPI: Derek Cooley. is a 55 y.o. male  No chief complaint on file.  HYPERTENSION / [place] Patient has been out of medication.  Satisfied with current treatment? no Duration of hypertension: years BP monitoring frequency: not checking BP range:  BP medication side effects: no Past BP meds: Patient has been out of medication Duration of hyperlipidemia: years Cholesterol medication side effects: no Cholesterol supplements: none Past cholesterol medications: none Medication compliance: poor compliance Aspirin: no Recent stressors: no Recurrent headaches: no Visual changes: yes Palpitations: no Dyspnea: no Chest pain: no Lower extremity edema: no Dizzy/lightheaded: no    DIABETES Hypoglycemic episodes:no Polydipsia/polyuria: yes Visual disturbance: sometimes blurry Chest pain: no Paresthesias: yes Glucose Monitoring: no  Accucheck frequency: Not Checking  Fasting glucose:  Post prandial:  Evening:  Before meals: Taking Insulin?: no  Long acting insulin:  Short acting insulin: Blood Pressure Monitoring: not checking Retinal Examination: Not up to Date Foot Exam: Not up to Date Diabetic Education: Not Completed Pneumovax: Up to Date Influenza: Not up to Date Aspirin: no  COPD Patient has been out of medication.  COPD status: stable Satisfied with current treatment?: yes Oxygen use: no Dyspnea frequency:  Cough frequency:  Rescue inhaler frequency:   Limitation of activity: yes Productive cough:  Last Spirometry:  Pneumovax: Up to Date Influenza: unknown  DEPRESSION/ANXIETY Patient states it has been rough lately. Patient has been out of medication. Denies SI.   Relevant past medical, surgical, family and social history reviewed and updated as indicated. Interim medical history since our last  visit reviewed. Allergies and medications reviewed and updated.  Review of Systems  Eyes: Positive for visual disturbance.  Respiratory: Positive for shortness of breath. Negative for chest tightness.   Cardiovascular: Negative for chest pain, palpitations and leg swelling.  Endocrine: Positive for polyuria. Negative for polydipsia.  Neurological: Positive for numbness. Negative for dizziness, light-headedness and headaches.    Per HPI unless specifically indicated above     Objective:    There were no vitals taken for this visit.  Wt Readings from Last 3 Encounters:  04/17/21 219 lb 2 oz (99.4 kg)  07/19/20 257 lb (116.6 kg)  06/29/20 265 lb (120.2 kg)    Physical Exam Vitals and nursing note reviewed.  Constitutional:      General: He is not in acute distress.    Appearance: Normal appearance. He is not ill-appearing, toxic-appearing or diaphoretic.  HENT:     Head: Normocephalic.     Right Ear: External ear normal.     Left Ear: External ear normal.     Nose: Nose normal. No congestion or rhinorrhea.     Mouth/Throat:     Mouth: Mucous membranes are moist.  Eyes:     General:        Right eye: No discharge.        Left eye: No discharge.     Extraocular Movements: Extraocular movements intact.     Conjunctiva/sclera: Conjunctivae normal.     Pupils: Pupils are equal, round, and reactive to light.  Cardiovascular:     Rate and Rhythm: Normal rate and regular rhythm.     Heart sounds: No murmur heard.   Pulmonary:     Effort: Pulmonary effort is normal. No respiratory distress.  Breath sounds: Decreased air movement present. Decreased breath sounds present. No wheezing, rhonchi or rales.  Abdominal:     General: Abdomen is flat. Bowel sounds are normal.  Musculoskeletal:     Cervical back: Normal range of motion and neck supple.  Skin:    General: Skin is warm and dry.     Capillary Refill: Capillary refill takes less than 2 seconds.  Neurological:      General: No focal deficit present.     Mental Status: He is alert and oriented to person, place, and time.  Psychiatric:        Mood and Affect: Mood normal.        Behavior: Behavior normal.        Thought Content: Thought content normal.        Judgment: Judgment normal.     Results for orders placed or performed in visit on 04/17/21  Chlamydia/Gonococcus/Trichomonas, NAA([facility])   Specimen: Urine   UR  Result Value Ref Range   Chlamydia by NAA Negative Negative   Gonococcus by NAA Negative Negative   Trich vag by NAA Negative Negative  Microscopic Examination   Urine  Result Value Ref Range   WBC, UA 0-5 0 - 5 /hpf   RBC None seen 0 - 2 /hpf   Epithelial Cells (non renal) 0-10 0 - 10 /hpf   Bacteria, UA Few (A) None seen/Few  Lipid panel  Result Value Ref Range   Cholesterol, Total 161 100 - 199 mg/dL   Triglycerides 187 (H) 0 - 149 mg/dL   HDL 24 (L) >39 mg/dL   VLDL Cholesterol Cal 33 5 - 40 mg/dL   LDL Chol Calc (NIH) 104 (H) 0 - 99 mg/dL   Chol/HDL Ratio 6.7 (H) 0.0 - 5.0 ratio  Basic metabolic panel  Result Value Ref Range   Glucose 208 (H) 65 - 99 mg/dL   BUN 27 (H) 6 - 24 mg/dL   Creatinine, Ser 1.95 (H) 0.76 - 1.27 mg/dL   eGFR 40 (L) >59 mL/min/1.73   BUN/Creatinine Ratio 14 9 - 20   Sodium 142 134 - 144 mmol/L   Potassium 4.9 3.5 - 5.2 mmol/L   Chloride 103 96 - 106 mmol/L   CO2 22 20 - 29 mmol/L   Calcium 9.2 8.7 - 10.2 mg/dL  Urinalysis, Routine w reflex microscopic  Result Value Ref Range   Specific Gravity, UA 1.015 1.005 - 1.030   pH, UA 5.5 5.0 - 7.5   Color, UA Yellow Yellow   Appearance Ur Clear Clear   Leukocytes,UA Trace (A) Negative   Protein,UA 2+ (A) Negative/Trace   Glucose, UA Negative Negative   Ketones, UA Negative Negative   RBC, UA Trace (A) Negative   Bilirubin, UA Negative Negative   Urobilinogen, Ur 0.2 0.2 - 1.0 mg/dL   Nitrite, UA Negative Negative   Microscopic Examination See below:   HgB A1c  Result Value Ref Range    Hgb A1c MFr Bld 6.8 (H) 4.8 - 5.6 %   Est. average glucose Bld gHb Est-mCnc 148 mg/dL  RPR  Result Value Ref Range   RPR Ser Ql Non Reactive Non Reactive  HIV Antibody (routine testing w rflx)  Result Value Ref Range   HIV Screen 4th Generation wRfx Non Reactive Non Reactive  Hepatitis C Antibody  Result Value Ref Range   Hep C Virus Ab <0.1 0.0 - 0.9 s/co ratio  HSV(herpes smplx)abs-1+2(IgG+IgM)-bld  Result Value Ref Range   HSVI/II Comb  IgM <0.91 0.00 - 0.90 Ratio   HSV 1 Glycoprotein G Ab, IgG 50.40 (H) 0.00 - 0.90 index   HSV 2 IgG, Type Spec <0.91 0.00 - 0.90 index      Assessment & Plan:   Problem List Items Addressed This Visit   None      Follow up plan: No follow-ups on file.

## 2021-05-18 ENCOUNTER — Ambulatory Visit: Payer: Medicare Other | Admitting: Infectious Diseases

## 2021-05-24 ENCOUNTER — Telehealth: Payer: Self-pay

## 2021-05-24 NOTE — Telephone Encounter (Signed)
Jury Duty form to be filled out. Placed in your folder for signature

## 2021-05-30 ENCOUNTER — Other Ambulatory Visit
Admission: RE | Admit: 2021-05-30 | Discharge: 2021-05-30 | Disposition: A | Discharge status: Home / Self Care | Payer: Medicare Other | Attending: Infectious Diseases | Admitting: Infectious Diseases

## 2021-05-30 ENCOUNTER — Ambulatory Visit: Payer: Medicare Other | Attending: Infectious Diseases | Admitting: Infectious Diseases

## 2021-05-30 ENCOUNTER — Other Ambulatory Visit: Payer: Self-pay

## 2021-05-30 ENCOUNTER — Encounter: Payer: Self-pay | Admitting: Infectious Diseases

## 2021-05-30 VITALS — BP 218/139 | HR 77 | Resp 16 | Ht 67.0 in | Wt 217.0 lb

## 2021-05-30 DIAGNOSIS — A4902 Methicillin resistant Staphylococcus aureus infection, unspecified site: Secondary | ICD-10-CM

## 2021-05-30 DIAGNOSIS — Z885 Allergy status to narcotic agent status: Secondary | ICD-10-CM | POA: Insufficient documentation

## 2021-05-30 DIAGNOSIS — Z9114 Patient's other noncompliance with medication regimen: Secondary | ICD-10-CM | POA: Insufficient documentation

## 2021-05-30 DIAGNOSIS — Z8249 Family history of ischemic heart disease and other diseases of the circulatory system: Secondary | ICD-10-CM | POA: Insufficient documentation

## 2021-05-30 DIAGNOSIS — Z833 Family history of diabetes mellitus: Secondary | ICD-10-CM | POA: Insufficient documentation

## 2021-05-30 DIAGNOSIS — F17210 Nicotine dependence, cigarettes, uncomplicated: Secondary | ICD-10-CM | POA: Insufficient documentation

## 2021-05-30 DIAGNOSIS — Z7984 Long term (current) use of oral hypoglycemic drugs: Secondary | ICD-10-CM | POA: Insufficient documentation

## 2021-05-30 DIAGNOSIS — Z881 Allergy status to other antibiotic agents status: Secondary | ICD-10-CM | POA: Diagnosis not present

## 2021-05-30 DIAGNOSIS — E1122 Type 2 diabetes mellitus with diabetic chronic kidney disease: Secondary | ICD-10-CM | POA: Diagnosis not present

## 2021-05-30 DIAGNOSIS — F1411 Cocaine abuse, in remission: Secondary | ICD-10-CM | POA: Diagnosis not present

## 2021-05-30 DIAGNOSIS — Z8614 Personal history of Methicillin resistant Staphylococcus aureus infection: Secondary | ICD-10-CM | POA: Diagnosis not present

## 2021-05-30 DIAGNOSIS — Z79899 Other long term (current) drug therapy: Secondary | ICD-10-CM | POA: Insufficient documentation

## 2021-05-30 DIAGNOSIS — I129 Hypertensive chronic kidney disease with stage 1 through stage 4 chronic kidney disease, or unspecified chronic kidney disease: Secondary | ICD-10-CM | POA: Insufficient documentation

## 2021-05-30 DIAGNOSIS — Z8679 Personal history of other diseases of the circulatory system: Secondary | ICD-10-CM | POA: Insufficient documentation

## 2021-05-30 DIAGNOSIS — N189 Chronic kidney disease, unspecified: Secondary | ICD-10-CM | POA: Insufficient documentation

## 2021-05-30 DIAGNOSIS — Z9049 Acquired absence of other specified parts of digestive tract: Secondary | ICD-10-CM | POA: Insufficient documentation

## 2021-05-30 LAB — SEDIMENTATION RATE: Sed Rate: 19 mm/hr (ref 0–20)

## 2021-05-30 LAB — CBC WITH DIFFERENTIAL/PLATELET
Abs Immature Granulocytes: 0.04 10*3/uL (ref 0.00–0.07)
Basophils Absolute: 0 10*3/uL (ref 0.0–0.1)
Basophils Relative: 1 %
Eosinophils Absolute: 0.3 10*3/uL (ref 0.0–0.5)
Eosinophils Relative: 3 %
HCT: 40.5 % (ref 39.0–52.0)
Hemoglobin: 14.2 g/dL (ref 13.0–17.0)
Immature Granulocytes: 1 %
Lymphocytes Relative: 31 %
Lymphs Abs: 2.6 10*3/uL (ref 0.7–4.0)
MCH: 30.8 pg (ref 26.0–34.0)
MCHC: 35.1 g/dL (ref 30.0–36.0)
MCV: 87.9 fL (ref 80.0–100.0)
Monocytes Absolute: 0.5 10*3/uL (ref 0.1–1.0)
Monocytes Relative: 6 %
Neutro Abs: 5 10*3/uL (ref 1.7–7.7)
Neutrophils Relative %: 58 %
Platelets: 265 10*3/uL (ref 150–400)
RBC: 4.61 MIL/uL (ref 4.22–5.81)
RDW: 13.4 % (ref 11.5–15.5)
WBC: 8.5 10*3/uL (ref 4.0–10.5)
nRBC: 0 % (ref 0.0–0.2)

## 2021-05-30 LAB — C-REACTIVE PROTEIN: CRP: 0.8 mg/dL (ref <1.0)

## 2021-05-30 NOTE — Patient Instructions (Addendum)
You are here to see whether you need Doxy for an infected hernia mesh you had in 2015. You were placed on Doxy following IV antibiotics. The plan was treat you indefinitely.  You have not taken Doxy for 6-8 months . You have not had any pain or discharge from the lap scars- you may not need doxy anymore- will discuss with [facility] ID who was treating you before The most important thing is your uncontrolled BP- your BP is 218/135 and you have not taken you meds in many days- you do not want to go to ED- your sister who is with you in this appt will look for the meds.please contact your PCP. You need to take amlodipine 10 mg , carvedilol 12.5 mg and lisinopril '20mg'$  for BP

## 2021-05-30 NOTE — Progress Notes (Signed)
NAME: Derek Cooley.  DOB: 10/21/1966  MRN: [id number]  Date/Time: 05/30/2021 10:55 AM  Subjective:   ? Derek Cooley. is a 55 y.o. 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   The following is taken from [place] records.  Patient was followed at [facility] and wants to shift his care to [facility] for convenience as he lives in [place]. He sustained a crush blunt force trauma injury to his abdomen in August 2007 that resulted in bowel perforation.  He underwent an exploratory laparotomy with sigmoid colectomy without anastomosis.  This was then followed with creation of a diverting colostomy placement of a Vicryl mesh and split thickness skin graft over his open abdominal wound.  He also had a tracheostomy at that time.  In June 2009 he underwent colostomy takedown with lysis of abdominal adhesions with component separation with overlay ultra Pro mesh.  He also had a ventral hernia repair with inlay PROCEED mesh and colostomy hernia repair with inlay PROCEED mesh.  This was complicated by 2 intra-abdominal abscesses that were drained during that hospitalization.  In July 2009 he underwent wound debridement and wound VAC placement due to continuous drainage and necrotic tissue around his wound site.  Cultures collected at that time grew Pseudomonas.  He continued to have drainage and poor wound healing Around his midline abdominal wound.  In 2012 he presented to a local wound clinic for assistance with wound healing.  His midline wound eventually healed.  But by the beginning of 2013 he began to have drainage from his 4 small drain sites wounds.  He was placed on different antibiotic regimen.  In October 2013 he was ago when a course of Augmentin due to concern for chronic mesh infection.  At that time general surgery suspected that he had  evidence of chronic sinus tracts and there was concern for chronic mesh infection. January 2014 he had his last follow-up with general surgery and was recommended to complete a month course of Augmentin.  Overall surgery felt that the risks to remove his mesh were too high and the complications would  outweigh any benefit.  He was referred to his primary care physician who put him on moxifloxacin which caused joint pain in left shoulder, right knee and his bilateral shins.  His primary care physician then referred him to [facility] infectious disease and he saw them on July 13, 2013.  During that visit no drainage was noted.  A few tests were ordered including CT abdomen, CBC, ESR and CRP.  He was later admitted to the hospital at [facility] in December 2014 with an abdominal wall abscess that grew MRSA.  It was drained by IR placed drain and he was started on vancomycin and he could not maintain a therapeutic level with a normal creatinine.  He was transitioned to daptomycin.  He was treated with 6 weeks of total IV antibiotics and later switched to suppressive doxycycline therapy.  He was followed in the ID clinic.In person in 2015 after which was lost to follow-up until October 2017.  He returned to the clinic on 10/01/2016 and was assessed by ID.  He had no drainage the wounds have healed well.  He had some abdominal pain with meals.  On examination scars were all healed across the abdomen with no erythema or drainage it was soft.  He was restarted on doxycycline.  He continued to get refills on the phone until December 2018.  He then had a couple of face-to-face visits and last seen in January 26, 2019.  His last prescription was from 02/12/2020 when he was given 5 refills.  So since October 2021 he is not been taking any doxycycline.  Its been 9 months.   Last CRP from 01/26/2019 is 7.9 and last ESR is 5.  He has not had any discharge or opening of the scars. He feels fatigued He has abdominal pain He continues to do  crack cocaine He drinks alcohol He is noncompliant with his blood pressure medication.  Past medical history Hypertension Diabetes Bowel perforation status post crush injury to the abdomen in 2007, status post colectomy and subsequent hemicolectomy with colostomy  takedown. 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 Acute pancreatitis in 2012 secondary to alcohol COPD GERD Hyperlipidemia Ongoing tobacco use Cognitive impairment dating from subarachnoid hemorrhage February 2011 Crack cocaine use Sleep apnea Erectile dysfunction      Past Surgical History:  Procedure Laterality Date   COLON SURGERY  2007   colostomy bag placed s/p perforated bowel   COLONOSCOPY WITH PROPOFOL N/A 05/18/2020   Procedure: COLONOSCOPY WITH PROPOFOL;  Surgeon: Lin Landsman, MD;  Location: [facility];  Service: Gastroenterology;  Laterality: N/A;   KNEE SURGERY Right    Perforated bowel      Social History   Socioeconomic History   Marital status: Divorced    Spouse name: Not on file   Number of children: Not on file   Years of education: Not on file   Highest education level: Not on file  Occupational History   Not on file  Tobacco Use   Smoking status: Every Day    Packs/day: 2.00    Pack years: 0.00    Types: Cigarettes    Start date: 10/11/1984   Smokeless tobacco: Never  Vaping Use   Vaping Use: Never used  Substance and Sexual Activity   Alcohol use: Yes    Alcohol/week: 0.0 standard drinks    Comment: occasionall    Drug use: No   Sexual activity: Not Currently  Other Topics Concern   Not on file  Social History Narrative   Not on file   Social Determinants of Health    Financial Resource Strain: Not on file  Food Insecurity: Not on file  Transportation Needs: Not on file  Physical Activity: Not on file  Stress: Not on file  Social Connections: Not on file  Intimate Partner Violence: Not on file    Family History  Problem Relation Age of Onset   Arthritis Mother    Asthma Mother    Diabetes Mother    Heart disease Mother    Hyperlipidemia Mother    Hypertension Mother    Kidney disease Mother    Thyroid disease Mother    Lung disease Mother    Anxiety disorder Mother    Depression Mother    Diabetes Father    Heart disease Father    Depression Father    Anxiety disorder Father    Arthritis Sister    Asthma Sister    Hyperlipidemia Sister    Hypertension Sister    Lung  disease Sister    Anxiety disorder Sister    Depression Sister    Hyperlipidemia Brother    Hypertension Brother    Diabetes Sister    Heart disease Sister    Depression Sister    Anxiety disorder Sister    Anxiety disorder Brother    Depression Brother    Heart disease Brother    Allergies  Allergen Reactions   Atorvastatin     Joint Aches - Severe Joint Aches - Severe Joint Aches - Severe   Avelox [Moxifloxacin Hcl In Nacl]     Muscle pain   Buprenorphine     Mouth sores, confusion, shaking   Dilaudid [Hydromorphone Hcl] Hives   Fluoxetine Itching   Levofloxacin Other (See Comments)    Joint Pain   Morphine And Related    Other     Muscle pain   Suboxone [Buprenorphine Hcl-Naloxone Hcl] Other (See Comments)    Rash and confused   Vancomycin     Renal insufficiency   I? Current Outpatient Medications  Medication Sig Dispense Refill   albuterol (PROVENTIL) (2.5 MG/3ML) 0.083% nebulizer solution Take 3 mLs (2.5 mg total) by nebulization every 6 (six) hours as needed for wheezing or shortness of breath. 150 mL 1   albuterol (VENTOLIN HFA) 108 (90 Base) MCG/ACT inhaler Inhale 1-2 puffs into the lungs every 6 (six) hours as needed for wheezing or  shortness of breath. 18 g 3   amLODipine (NORVASC) 10 MG tablet Take 1 tablet (10 mg total) by mouth daily. 90 tablet 0   carvedilol (COREG) 12.5 MG tablet Take 1 tablet (12.5 mg total) by mouth 2 (two) times daily with a meal. 180 tablet 0   DULoxetine (CYMBALTA) 60 MG capsule Take 1 capsule (60 mg total) by mouth 2 (two) times daily. 180 capsule 0   fenofibrate micronized (LOFIBRA) 67 MG capsule TAKE 1 CAPSULE EVERY DAY BEFORE BREAKFAST 90 capsule 0   fluticasone (FLONASE) 50 MCG/ACT nasal spray Place 2 sprays into both nostrils daily. 16 g 12   furosemide (LASIX) 20 MG tablet Take 1 tablet (20 mg total) by mouth daily. 30 tablet 0   glipiZIDE (GLUCOTROL XL) 5 MG 24 hr tablet TAKE ONE TABLET EACH MORNING WITH BREAKFAST 90 tablet 0   glucose blood (ONETOUCH ULTRA) test strip USE TO TEST BLOOD SUGAR DAILY 100 each 3   hydrOXYzine (ATARAX/VISTARIL) 50 MG tablet TAKE ONE TABLET 3 TIMES DAILY AS NEEDED 90 tablet 1   ibuprofen (ADVIL) 800 MG tablet Take 1 tablet (800 mg total) by mouth every 8 (eight) hours as needed. 30 tablet 0   lisinopril (ZESTRIL) 20 MG tablet Take 1 tablet (20 mg total) by mouth daily. 90 tablet 0   methocarbamol (ROBAXIN) 750 MG tablet Take 1,500 mg by mouth 4 (four) times daily.     pantoprazole (PROTONIX) 40 MG tablet Take 1 tablet (40 mg total) by mouth 2 (two) times daily as needed. 60 tablet 2   pregabalin (LYRICA) 50 MG capsule Take 50 mg by mouth 4 (four) times daily.     tiZANidine (ZANAFLEX) 4 MG tablet TAKE ONE TABLET TWICE DAILY AS NEEDED FOR MUSCLE SPASM 60 tablet 0   topiramate (TOPAMAX) 50 MG tablet 50 mg 2 (two) times daily.      traZODone (DESYREL) 100 MG tablet Take 100 mg by mouth at bedtime.     doxycycline (VIBRAMYCIN) 100 MG capsule TAKE 1 CAPSULE BY MOUTH TWICE DAILY (Patient not taking: Reported on 05/30/2021) 60 capsule  0   umeclidinium-vilanterol (ANORO ELLIPTA) 62.5-25 MCG/INH AEPB Inhale 1 puff into the lungs daily. (Patient not taking: Reported on  05/30/2021) 60 each 2   No current facility-administered medications for this visit.     Abtx:  Anti-infectives (From admission, onward)    None       REVIEW OF SYSTEMS:  Const: negative fever, negative chills, negative weight loss Eyes: negative diplopia or visual changes, negative eye pain ENT: negative coryza, negative sore throat Resp: negative cough, hemoptysis, dyspnea Cards: negative for chest pain, palpitations, lower extremity edema GU: negative for frequency, dysuria and hematuria GI: Occasional abdominal pain.  No discharge or open wounds for the past 5 years. Heme: negative for easy bruising and gum/nose bleeding MS: Fatigue and generalized weakness Neurolo: headaches, dizziness, , memory problems  Psych:anxiety, depression  Endocrine: n, diabetes Allergies Quinolones caused muscle pain Vancomycin renal insufficiency  Objective:  VITALS:  BP (!) 218/139   Pulse 77   Resp 16   Ht 5' 7" (1.702 m)   Wt 217 lb (98.4 kg)   SpO2 95%   BMI 33.99 kg/m  PHYSICAL EXAM:  General: Alert, cooperative, no distress, appears stated age.  Head: Normocephalic, without obvious abnormality, scar over the right side of the head Eyes: Conjunctivae clear, anicteric sclerae. Pupils are equal ENT Nares normal. No drainage or sinus tenderness. Lips, mucosa, and tongue normal. No Thrush Neck: Supple, symmetrical, no adenopathy, thyroid: non tender no carotid bruit and no JVD. Back: No CVA tenderness. Lungs: Clear to auscultation bilaterally. No Wheezing or Rhonchi. No rales. Heart: Regular rate and rhythm, no murmur, rub or gallop. Abdomen: Laparoscopic scars and laparotomy scar all healed well.   No tenderness on palpation       Extremities: atraumatic, no cyanosis. No edema. No clubbing Skin: No rashes or lesions. Or bruising Lymph: Cervical, supraclavicular normal. Neurologic: Grossly non-focal Pertinent Labs Lab Results CBC    Component Value Date/Time   WBC  9.5 06/29/2020 1033   WBC 8.6 03/23/2020 1322   RBC 5.25 06/29/2020 1033   RBC 5.28 03/23/2020 1322   HGB 16.0 06/29/2020 1033   HCT 47.0 06/29/2020 1033   PLT 229 06/29/2020 1033   MCV 90 06/29/2020 1033   MCV 87 08/12/2014 1019   MCH 30.5 06/29/2020 1033   MCH 29.9 03/23/2020 1322   MCHC 34.0 06/29/2020 1033   MCHC 33.3 03/23/2020 1322   RDW 14.6 06/29/2020 1033   RDW 12.7 08/12/2014 1019   LYMPHSABS 3.0 06/29/2020 1033   LYMPHSABS 2.6 08/12/2014 1019   MONOABS 0.6 03/23/2020 1322   MONOABS 0.6 08/12/2014 1019   EOSABS 0.4 06/29/2020 1033   EOSABS 0.4 08/12/2014 1019   BASOSABS 0.1 06/29/2020 1033   BASOSABS 0.0 08/12/2014 1019    CMP Latest Ref Rng & Units 04/17/2021 06/29/2020 03/23/2020  Glucose 65 - 99 mg/dL 208(H) 276(H) 120(H)  BUN 6 - 24 mg/dL 27(H) 15 23(H)  Creatinine 0.76 - 1.27 mg/dL 1.95(H) 1.41(H) 1.92(H)  Sodium 134 - 144 mmol/L 142 139 139  Potassium 3.5 - 5.2 mmol/L 4.9 4.4 4.3  Chloride 96 - 106 mmol/L 103 103 108  CO2 20 - 29 mmol/L _0 Calcium 8.7 - 10.2 mg/dL 9.2 9.5 9.1  Total Protein 6.0 - 8.5 g/dL - 6.7 7.3  Total Bilirubin 0.0 - 1.2 mg/dL - 0.3 1.0  Alkaline Phos 48 - 121 IU/L - 122(H) 65  AST 0 - 40 IU/L - 19 20  ALT 0 - 44  IU/L - 27 19    ? Impression/Recommendation  Uncontrolled HTN.  His blood pressure was 218/135 and repeat was still high.  He has not taken his medications in many days now which are amlodipine, carvedilol, lisinopril. Discussed with him and sister that he has to start the medication and follow-up with his PCP  CKD.  Combination of uncontrolled hypertension, diabetes mellitus, crack cocaine use  Diabetes mellitus on glipizide but has not been taking it   Complicated abdominal surgical history. Bowel perforation due to crush injury Colectomy, colostomy and takedown Ventral hernia repair History of infected hernia mesh with MRSA in 2015 and was treated with 6 weeks of daptomycin followed by intermittent suppressive  doxycycline until October 2019.  The plan was to give him suppressive therapy indefinitely at [facility].  He ran out of doxycycline and did not follow-up with [facility] for the past 8 months.  ? I currently do not see the need for reinitiating the doxycycline especially with him being off therapy for 8 months and the wounds have completely healed and has not discharged for the past 5 years.  Very likely the infected mesh  has been treated adequately and it is now covered with tissue. On assessing the risk versus benefit of doxycycline he may not benefit from it anymore and rather may have complications from doxycycline including C. difficile and microbiome change  History of subarachnoid hemorrhage.  History of crack cocaine use.  Says he has not used it in more than a month  History of alcohol use  Current smoker   We will send ESR, CRP and CBC today. Will touch base with his [facility] provider. Follow-up as needed. ? ___________________________________________________ Discussed with patient and his sister in great detail.  Communicated with his PCP  note:  This document was prepared using Dragon voice recognition software and may include unintentional dictation errors.

## 2021-09-04 ENCOUNTER — Telehealth: Payer: Self-pay | Admitting: Nurse Practitioner

## 2021-09-04 NOTE — Telephone Encounter (Signed)
LM with Mrs.Reels to have pt call the office to schedule AWV.

## 2021-10-24 ENCOUNTER — Telehealth: Payer: Self-pay | Admitting: Nurse Practitioner

## 2021-10-24 NOTE — Telephone Encounter (Signed)
Copied from [place] [id number]. Topic: Medicare AWV >> Oct 24, 2021 12:09 PM Lavonia Drafts wrote: Reason for CRM:  Left message for patient to call back and schedule the Medicare Annual Wellness Visit (AWV) virtually or by telephone.  Last AWV 11/18/19  Please schedule at anytime with [facility] Advisor.  45 minute appointment  Any questions, please call me at [phone]

## 2021-11-25 ENCOUNTER — Emergency Department: Payer: Medicare Other

## 2021-11-25 ENCOUNTER — Encounter: Payer: Self-pay | Admitting: Emergency Medicine

## 2021-11-25 ENCOUNTER — Inpatient Hospital Stay
Admission: EM | Admit: 2021-11-25 | Discharge: 2021-11-28 | DRG: 291 | Disposition: A | Discharge status: Home / Self Care | Payer: Medicare Other | Attending: Internal Medicine | Admitting: Internal Medicine

## 2021-11-25 ENCOUNTER — Other Ambulatory Visit: Payer: Self-pay

## 2021-11-25 DIAGNOSIS — Z8614 Personal history of Methicillin resistant Staphylococcus aureus infection: Secondary | ICD-10-CM

## 2021-11-25 DIAGNOSIS — Z825 Family history of asthma and other chronic lower respiratory diseases: Secondary | ICD-10-CM | POA: Diagnosis not present

## 2021-11-25 DIAGNOSIS — Z888 Allergy status to other drugs, medicaments and biological substances status: Secondary | ICD-10-CM

## 2021-11-25 DIAGNOSIS — I130 Hypertensive heart and chronic kidney disease with heart failure and stage 1 through stage 4 chronic kidney disease, or unspecified chronic kidney disease: Principal | ICD-10-CM | POA: Diagnosis present

## 2021-11-25 DIAGNOSIS — Z833 Family history of diabetes mellitus: Secondary | ICD-10-CM | POA: Diagnosis not present

## 2021-11-25 DIAGNOSIS — S2241XA Multiple fractures of ribs, right side, initial encounter for closed fracture: Secondary | ICD-10-CM | POA: Diagnosis present

## 2021-11-25 DIAGNOSIS — Z20822 Contact with and (suspected) exposure to covid-19: Secondary | ICD-10-CM | POA: Diagnosis present

## 2021-11-25 DIAGNOSIS — N179 Acute kidney failure, unspecified: Secondary | ICD-10-CM | POA: Diagnosis present

## 2021-11-25 DIAGNOSIS — J45909 Unspecified asthma, uncomplicated: Secondary | ICD-10-CM | POA: Diagnosis present

## 2021-11-25 DIAGNOSIS — N182 Chronic kidney disease, stage 2 (mild): Secondary | ICD-10-CM | POA: Diagnosis present

## 2021-11-25 DIAGNOSIS — K219 Gastro-esophageal reflux disease without esophagitis: Secondary | ICD-10-CM | POA: Diagnosis present

## 2021-11-25 DIAGNOSIS — Z933 Colostomy status: Secondary | ICD-10-CM

## 2021-11-25 DIAGNOSIS — F1910 Other psychoactive substance abuse, uncomplicated: Secondary | ICD-10-CM | POA: Diagnosis not present

## 2021-11-25 DIAGNOSIS — F32A Depression, unspecified: Secondary | ICD-10-CM | POA: Diagnosis present

## 2021-11-25 DIAGNOSIS — Z8679 Personal history of other diseases of the circulatory system: Secondary | ICD-10-CM

## 2021-11-25 DIAGNOSIS — J439 Emphysema, unspecified: Secondary | ICD-10-CM | POA: Diagnosis present

## 2021-11-25 DIAGNOSIS — F1510 Other stimulant abuse, uncomplicated: Secondary | ICD-10-CM

## 2021-11-25 DIAGNOSIS — E669 Obesity, unspecified: Secondary | ICD-10-CM | POA: Diagnosis present

## 2021-11-25 DIAGNOSIS — Z91199 Patient's noncompliance with other medical treatment and regimen due to unspecified reason: Secondary | ICD-10-CM

## 2021-11-25 DIAGNOSIS — E119 Type 2 diabetes mellitus without complications: Secondary | ICD-10-CM

## 2021-11-25 DIAGNOSIS — I248 Other forms of acute ischemic heart disease: Secondary | ICD-10-CM | POA: Diagnosis present

## 2021-11-25 DIAGNOSIS — Z8249 Family history of ischemic heart disease and other diseases of the circulatory system: Secondary | ICD-10-CM

## 2021-11-25 DIAGNOSIS — G4730 Sleep apnea, unspecified: Secondary | ICD-10-CM | POA: Diagnosis present

## 2021-11-25 DIAGNOSIS — G4440 Drug-induced headache, not elsewhere classified, not intractable: Secondary | ICD-10-CM | POA: Diagnosis not present

## 2021-11-25 DIAGNOSIS — Z8261 Family history of arthritis: Secondary | ICD-10-CM

## 2021-11-25 DIAGNOSIS — I509 Heart failure, unspecified: Secondary | ICD-10-CM | POA: Diagnosis not present

## 2021-11-25 DIAGNOSIS — E1122 Type 2 diabetes mellitus with diabetic chronic kidney disease: Secondary | ICD-10-CM | POA: Diagnosis present

## 2021-11-25 DIAGNOSIS — W1781XA Fall down embankment (hill), initial encounter: Secondary | ICD-10-CM

## 2021-11-25 DIAGNOSIS — I161 Hypertensive emergency: Secondary | ICD-10-CM | POA: Diagnosis not present

## 2021-11-25 DIAGNOSIS — J810 Acute pulmonary edema: Secondary | ICD-10-CM | POA: Diagnosis not present

## 2021-11-25 DIAGNOSIS — Z83438 Family history of other disorder of lipoprotein metabolism and other lipidemia: Secondary | ICD-10-CM

## 2021-11-25 DIAGNOSIS — R0603 Acute respiratory distress: Secondary | ICD-10-CM | POA: Diagnosis not present

## 2021-11-25 DIAGNOSIS — N184 Chronic kidney disease, stage 4 (severe): Secondary | ICD-10-CM | POA: Diagnosis present

## 2021-11-25 DIAGNOSIS — I5033 Acute on chronic diastolic (congestive) heart failure: Secondary | ICD-10-CM | POA: Diagnosis present

## 2021-11-25 DIAGNOSIS — Z6833 Body mass index (BMI) 33.0-33.9, adult: Secondary | ICD-10-CM

## 2021-11-25 DIAGNOSIS — F17210 Nicotine dependence, cigarettes, uncomplicated: Secondary | ICD-10-CM | POA: Diagnosis present

## 2021-11-25 DIAGNOSIS — Z9114 Patient's other noncompliance with medication regimen: Secondary | ICD-10-CM

## 2021-11-25 DIAGNOSIS — F1410 Cocaine abuse, uncomplicated: Secondary | ICD-10-CM

## 2021-11-25 DIAGNOSIS — Z885 Allergy status to narcotic agent status: Secondary | ICD-10-CM

## 2021-11-25 DIAGNOSIS — R778 Other specified abnormalities of plasma proteins: Secondary | ICD-10-CM | POA: Diagnosis not present

## 2021-11-25 DIAGNOSIS — I10 Essential (primary) hypertension: Secondary | ICD-10-CM | POA: Diagnosis present

## 2021-11-25 DIAGNOSIS — E781 Pure hyperglyceridemia: Secondary | ICD-10-CM | POA: Diagnosis present

## 2021-11-25 DIAGNOSIS — T463X5A Adverse effect of coronary vasodilators, initial encounter: Secondary | ICD-10-CM | POA: Diagnosis not present

## 2021-11-25 DIAGNOSIS — Z8349 Family history of other endocrine, nutritional and metabolic diseases: Secondary | ICD-10-CM

## 2021-11-25 DIAGNOSIS — Z841 Family history of disorders of kidney and ureter: Secondary | ICD-10-CM

## 2021-11-25 DIAGNOSIS — Z818 Family history of other mental and behavioral disorders: Secondary | ICD-10-CM

## 2021-11-25 DIAGNOSIS — I119 Hypertensive heart disease without heart failure: Secondary | ICD-10-CM | POA: Diagnosis present

## 2021-11-25 DIAGNOSIS — Z79899 Other long term (current) drug therapy: Secondary | ICD-10-CM

## 2021-11-25 DIAGNOSIS — Z7984 Long term (current) use of oral hypoglycemic drugs: Secondary | ICD-10-CM

## 2021-11-25 DIAGNOSIS — I2489 Other forms of acute ischemic heart disease: Secondary | ICD-10-CM

## 2021-11-25 DIAGNOSIS — F410 Panic disorder [episodic paroxysmal anxiety] without agoraphobia: Secondary | ICD-10-CM | POA: Diagnosis present

## 2021-11-25 DIAGNOSIS — R0602 Shortness of breath: Secondary | ICD-10-CM | POA: Diagnosis present

## 2021-11-25 DIAGNOSIS — I129 Hypertensive chronic kidney disease with stage 1 through stage 4 chronic kidney disease, or unspecified chronic kidney disease: Secondary | ICD-10-CM

## 2021-11-25 DIAGNOSIS — J441 Chronic obstructive pulmonary disease with (acute) exacerbation: Secondary | ICD-10-CM

## 2021-11-25 DIAGNOSIS — Z881 Allergy status to other antibiotic agents status: Secondary | ICD-10-CM

## 2021-11-25 LAB — URINALYSIS, ROUTINE W REFLEX MICROSCOPIC
Bacteria, UA: NONE SEEN
Bilirubin Urine: NEGATIVE
Glucose, UA: NEGATIVE mg/dL
Ketones, ur: NEGATIVE mg/dL
Leukocytes,Ua: NEGATIVE
Nitrite: NEGATIVE
Protein, ur: >=300 mg/dL — AB
Specific Gravity, Urine: 1.015 (ref 1.005–1.030)
pH: 6 (ref 5.0–8.0)

## 2021-11-25 LAB — CBC
HCT: 31.6 % — ABNORMAL LOW (ref 39.0–52.0)
Hemoglobin: 10.4 g/dL — ABNORMAL LOW (ref 13.0–17.0)
MCH: 30.9 pg (ref 26.0–34.0)
MCHC: 32.9 g/dL (ref 30.0–36.0)
MCV: 93.8 fL (ref 80.0–100.0)
Platelets: 255 10*3/uL (ref 150–400)
RBC: 3.37 MIL/uL — ABNORMAL LOW (ref 4.22–5.81)
RDW: 13.2 % (ref 11.5–15.5)
WBC: 11.1 10*3/uL — ABNORMAL HIGH (ref 4.0–10.5)
nRBC: 0 % (ref 0.0–0.2)

## 2021-11-25 LAB — TROPONIN I (HIGH SENSITIVITY)
Troponin I (High Sensitivity): 123 ng/L (ref <18)
Troponin I (High Sensitivity): 108 ng/L (ref <18)

## 2021-11-25 LAB — HEPATIC FUNCTION PANEL
ALT: 32 U/L (ref 0–44)
AST: 42 U/L — ABNORMAL HIGH (ref 15–41)
Albumin: 3.3 g/dL — ABNORMAL LOW (ref 3.5–5.0)
Alkaline Phosphatase: 99 U/L (ref 38–126)
Bilirubin, Direct: 0.1 mg/dL (ref 0.0–0.2)
Indirect Bilirubin: 0.5 mg/dL (ref 0.3–0.9)
Total Bilirubin: 0.6 mg/dL (ref 0.3–1.2)
Total Protein: 6.9 g/dL (ref 6.5–8.1)

## 2021-11-25 LAB — BASIC METABOLIC PANEL
Anion gap: 6 (ref 5–15)
BUN: 41 mg/dL — ABNORMAL HIGH (ref 6–20)
CO2: 24 mmol/L (ref 22–32)
Calcium: 8.4 mg/dL — ABNORMAL LOW (ref 8.9–10.3)
Chloride: 110 mmol/L (ref 98–111)
Creatinine, Ser: 3.45 mg/dL — ABNORMAL HIGH (ref 0.61–1.24)
GFR, Estimated: 20 mL/min — ABNORMAL LOW (ref >60)
Glucose, Bld: 116 mg/dL — ABNORMAL HIGH (ref 70–99)
Potassium: 3.9 mmol/L (ref 3.5–5.1)
Sodium: 140 mmol/L (ref 135–145)

## 2021-11-25 LAB — BRAIN NATRIURETIC PEPTIDE: B Natriuretic Peptide: 549.9 pg/mL — ABNORMAL HIGH (ref 0.0–100.0)

## 2021-11-25 LAB — URINE DRUG SCREEN, QUALITATIVE (ARMC ONLY)
Amphetamines, Ur Screen: POSITIVE — AB
Barbiturates, Ur Screen: NOT DETECTED
Benzodiazepine, Ur Scrn: NOT DETECTED
Cannabinoid 50 Ng, Ur ~~LOC~~: NOT DETECTED
Cocaine Metabolite,Ur ~~LOC~~: POSITIVE — AB
MDMA (Ecstasy)Ur Screen: NOT DETECTED
Methadone Scn, Ur: NOT DETECTED
Opiate, Ur Screen: NOT DETECTED
Phencyclidine (PCP) Ur S: NOT DETECTED
Tricyclic, Ur Screen: NOT DETECTED

## 2021-11-25 LAB — RESP PANEL BY RT-PCR (FLU A&B, COVID) ARPGX2
Influenza A by PCR: NEGATIVE
Influenza B by PCR: NEGATIVE
SARS Coronavirus 2 by RT PCR: NEGATIVE

## 2021-11-25 LAB — LIPASE, BLOOD: Lipase: 37 U/L (ref 11–51)

## 2021-11-25 IMAGING — CT CT CHEST-ABD-PELV W/O CM
2 of 4 series · 14 of 36 positions shown, 16 images · non-contrast
Comparison: [DATE]

CLINICAL DATA: Pt comes into the ED via POV c/ob SHOB that started
several days ago. Pt states he fell down a hill a couple days ago
and landed on the right side of his ribs. Pt also has h/o HTN and is
noncompliant with meds. Pt has even and unlabored respirations at
this time.

EXAM:
CT CHEST, ABDOMEN AND PELVIS WITHOUT CONTRAST
TECHNIQUE: Multidetector CT imaging of the chest, abdomen and pelvis was
performed following the standard protocol without IV contrast.

[Series 2: cap wo st · axial · 0.79mm/px · z∈[-828,-254]mm · 11 of 139 slices shown, 13 images]
[im 12/139  mediastinal]
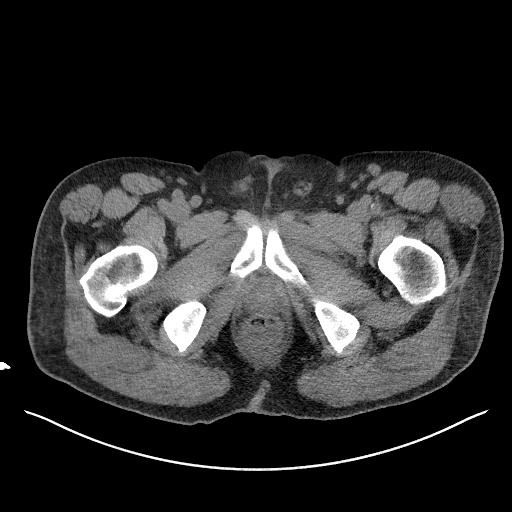
[im 12/139  bone]
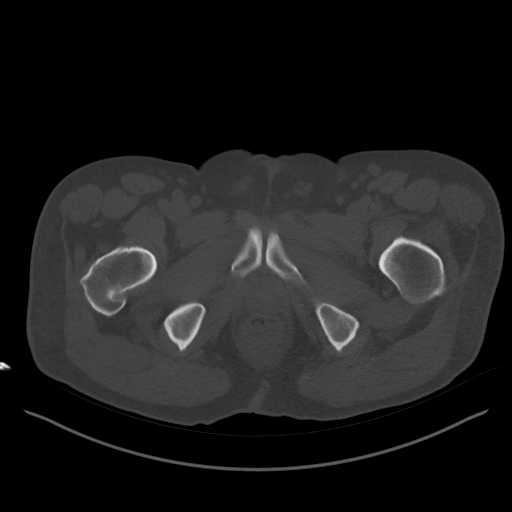
[im 24/139  mediastinal]
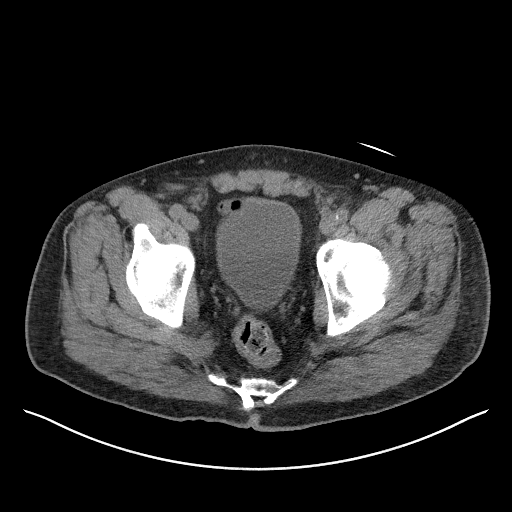
[im 35/139  mediastinal]
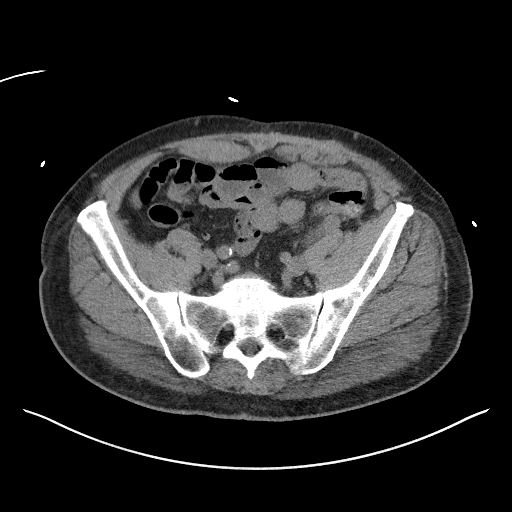
[im 47/139  mediastinal]
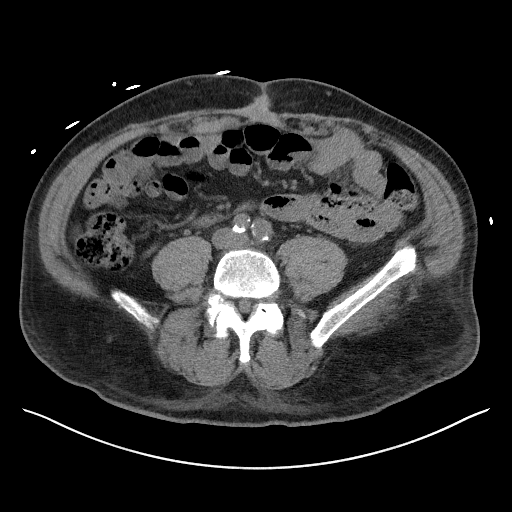
[im 58/139  mediastinal]
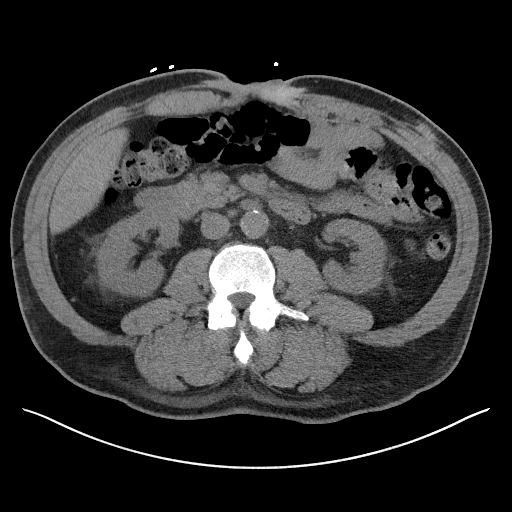
[im 70/139  mediastinal]
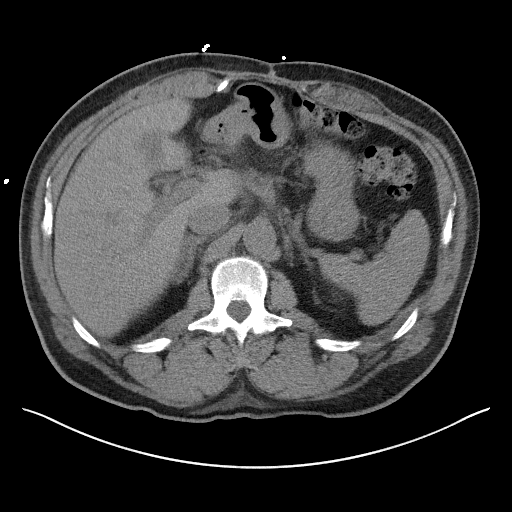
[im 81/139  mediastinal]
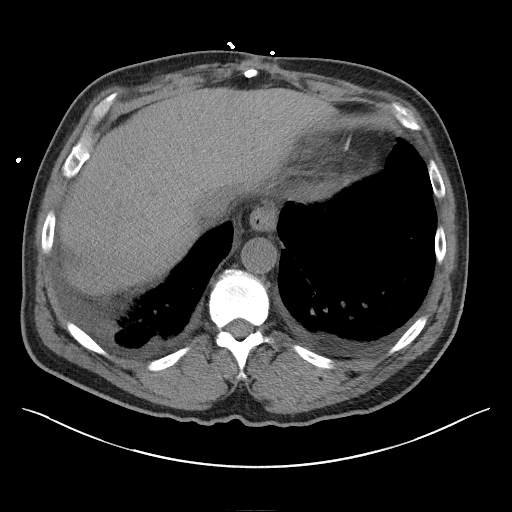
[im 93/139  mediastinal]
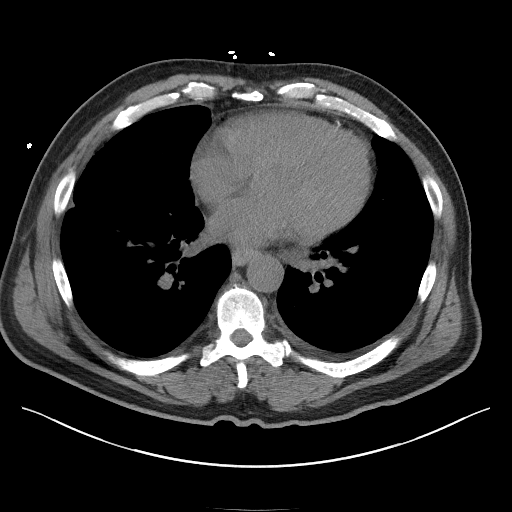
[im 104/139  mediastinal]
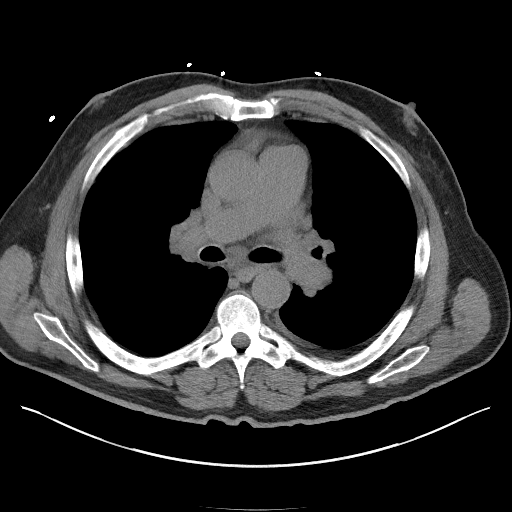
[im 104/139  bone]
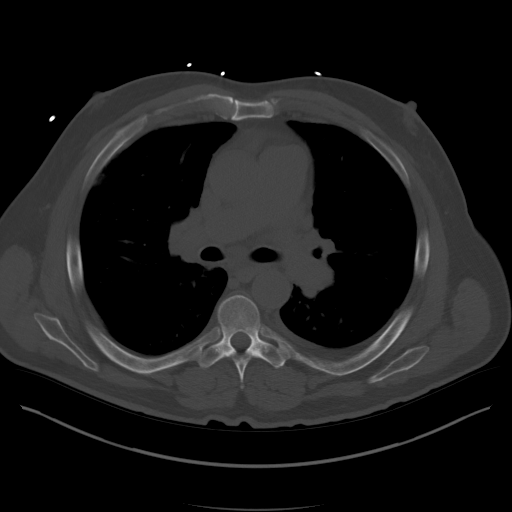
[im 116/139  mediastinal]
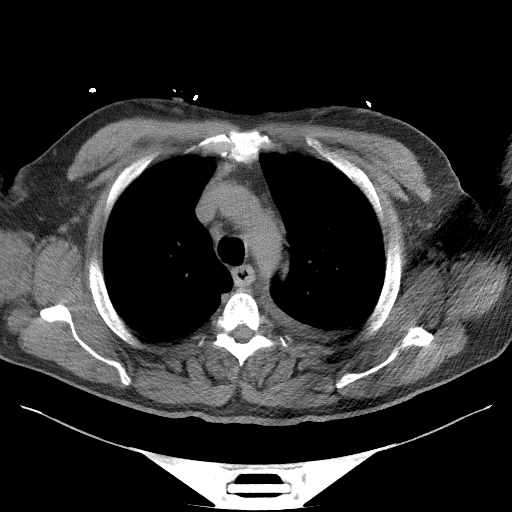
[im 127/139  mediastinal]
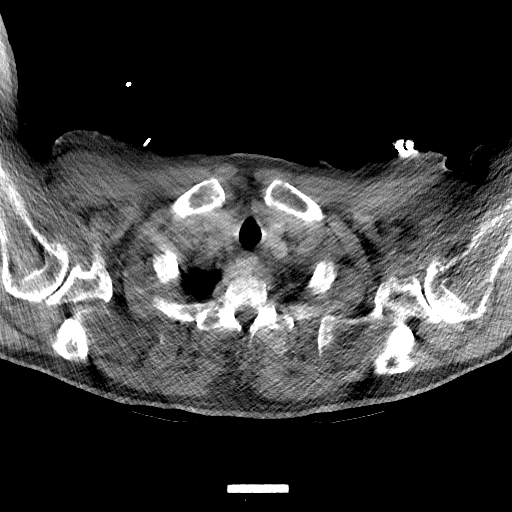

[Series 5: coronal · coronal · 0.92mm/px · 3 of 167 slices shown]
[im 34/167  mediastinal]
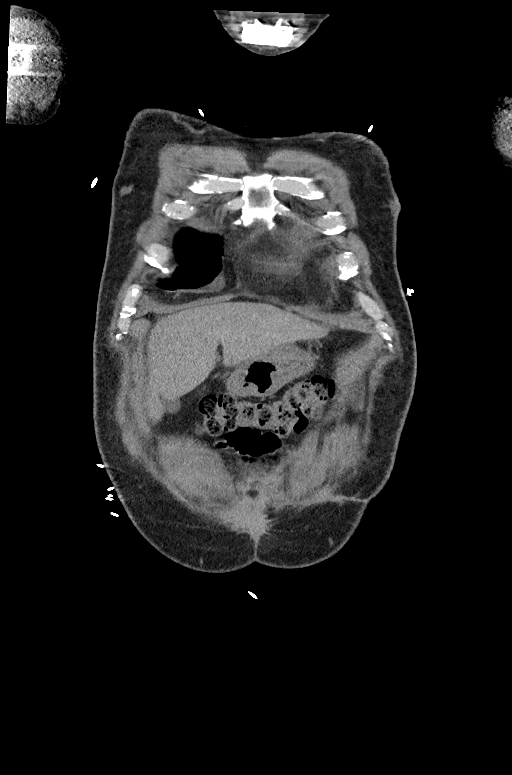
[im 67/167  mediastinal]
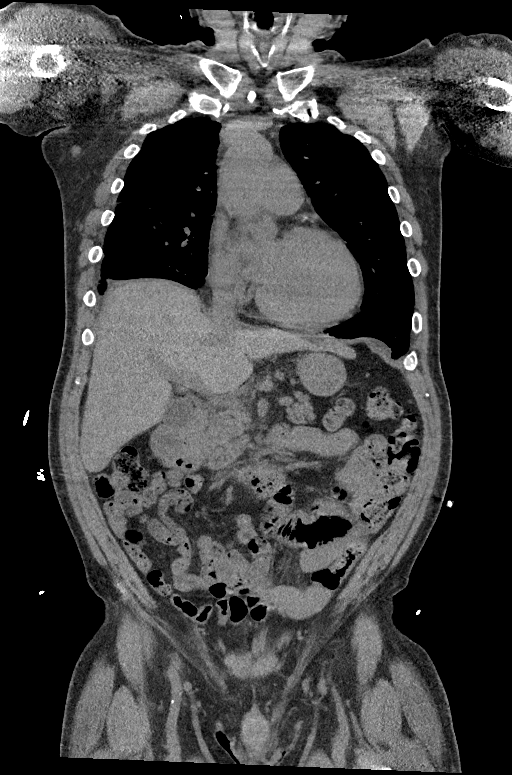
[im 100/167  mediastinal]
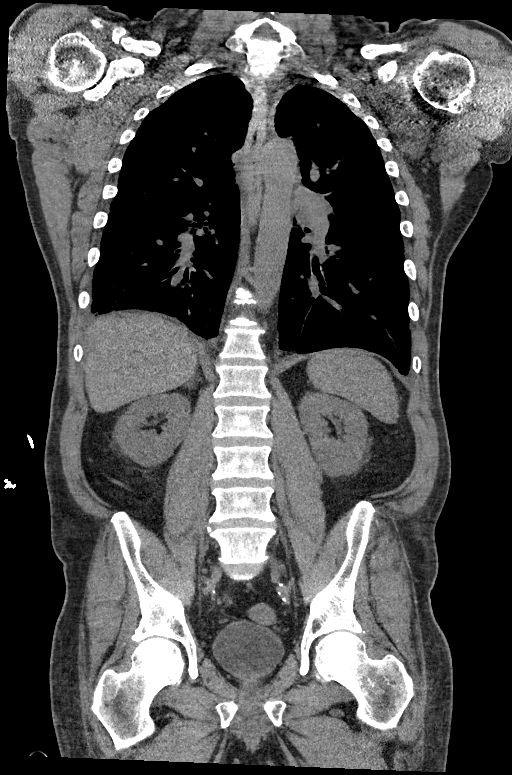

[14 of 36 positions shown; findings below may reference images not displayed]

FINDINGS: CT CHEST FINDINGS

Cardiovascular: Heart normal in size. No pericardial effusion. 3
vessel coronary artery calcifications. Great vessels are normal in
caliber.

Mediastinum/Nodes: No neck base, mediastinal or hilar masses or
enlarged lymph nodes. Trachea and esophagus are unremarkable.

Lungs/Pleura: Bilateral interstitial thickening, most prominent in
the bases. There is additional mild dependent lung base opacity in
the lower lobes consistent with atelectasis. No lung consolidation
to suggest pneumonia. Trace pleural effusions. No pneumothorax.

Musculoskeletal: Subtle nondisplaced fractures of the anterior right
fourth, fifth and sixth ribs. No other acute fractures. There are
additional old healed right rib fractures. No bone lesions.

CT ABDOMEN PELVIS FINDINGS

Hepatobiliary: No focal liver abnormality is seen. No gallstones,
gallbladder wall thickening, or biliary dilatation.

Pancreas: Unremarkable. No pancreatic ductal dilatation or
surrounding inflammatory changes.

Spleen: Normal in size without focal abnormality.

Adrenals/Urinary Tract: No adrenal masses. Kidneys normal in size,
orientation and position. No renal masses or stones. No
hydronephrosis. Mild bilateral perinephric stranding, presumed
chronic. Ureters are normal in course and in caliber. Bladder is
unremarkable.

Stomach/Bowel: Normal stomach. Bowel anastomosis staple line noted
along the mid sigmoid colon. Small bowel and colon are normal in
caliber. No wall thickening. No inflammation. Normal appendix
visualized.

Vascular/Lymphatic: Aortic atherosclerosis. No aneurysm. No enlarged
lymph nodes.

Reproductive: Unremarkable.

Other: Anterior abdominal wall midline incision. No hernia. No
ascites.

Musculoskeletal: No fracture or acute finding.  No bone lesion.
IMPRESSION: 1. Subtle nondisplaced fractures of the anterior right fourth, fifth
and sixth ribs, which appear acute.
2. Lungs with bilateral interstitial thickening and trace pleural
effusions, findings consistent with interstitial pulmonary edema.
3. No other acute abnormality within the chest, abdomen or pelvis.
4. Aortic atherosclerosis.

## 2021-11-25 IMAGING — CT CT HEAD W/O CM
4 series · 16 of 47 positions shown, 18 images · non-contrast
Comparison: CT [DATE]

CLINICAL DATA: Subarachnoid hemorrhage ([place])

EXAM:
CT HEAD WITHOUT CONTRAST
TECHNIQUE: Contiguous axial images were obtained from the base of the skull
through the vertex without intravenous contrast.

[Series 2: head (person_name) · axial · 0.46mm/px · z∈[-110,+10]mm · 7 of 34 slices shown, 9 images]
[im 5/34  brain]
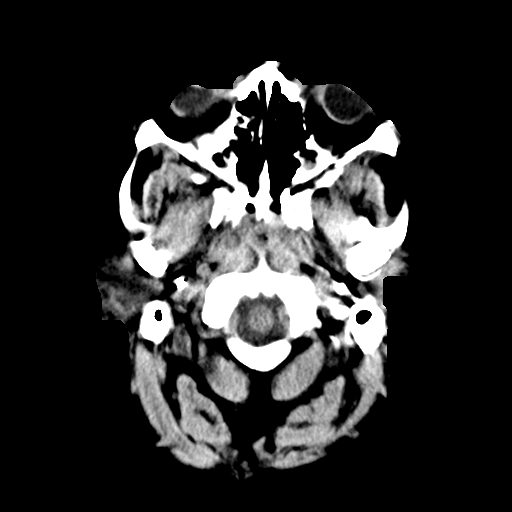
[im 5/34  bone]
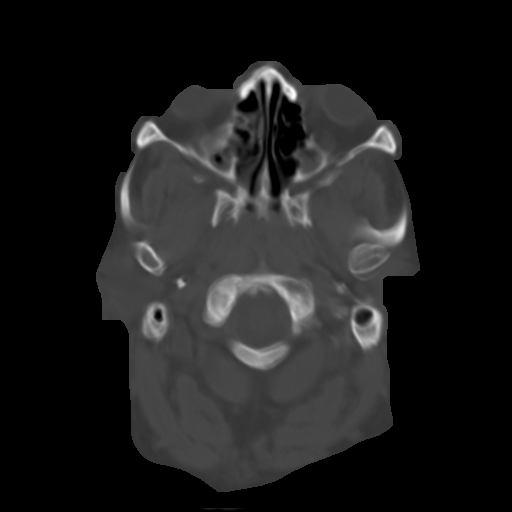
[im 9/34  brain]
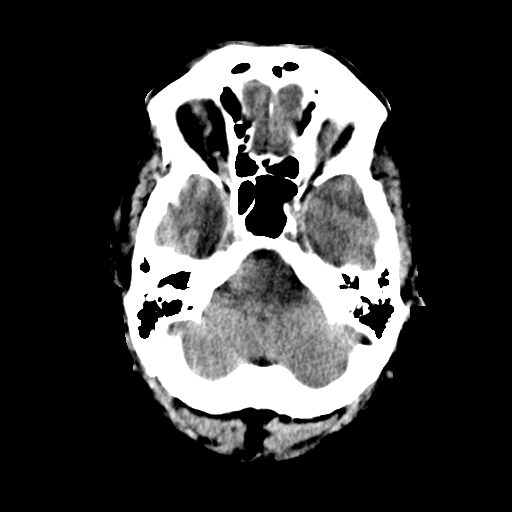
[im 13/34  brain]
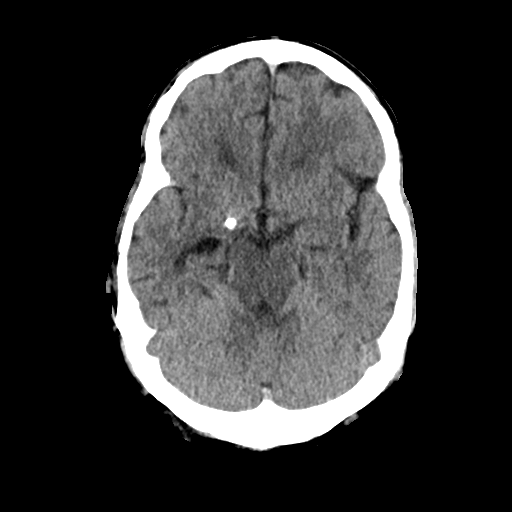
[im 17/34  brain]
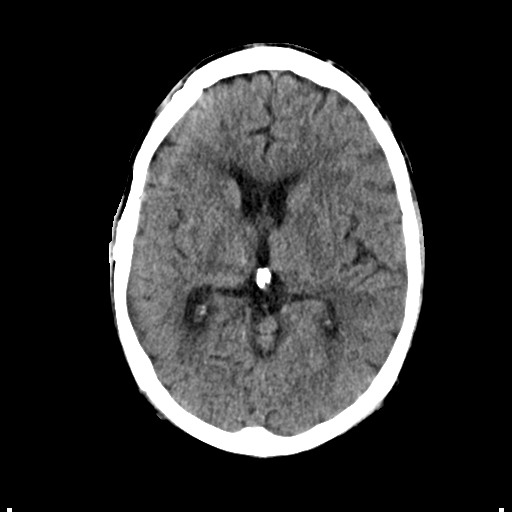
[im 21/34  brain]
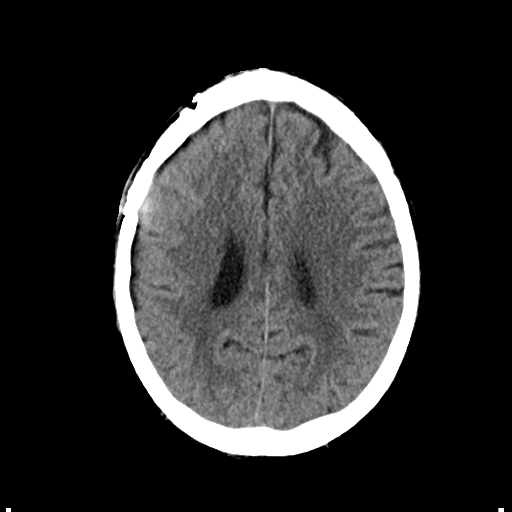
[im 21/34  bone]
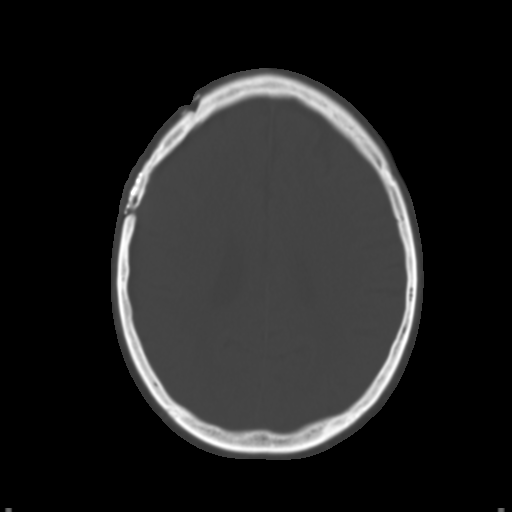
[im 25/34  brain]
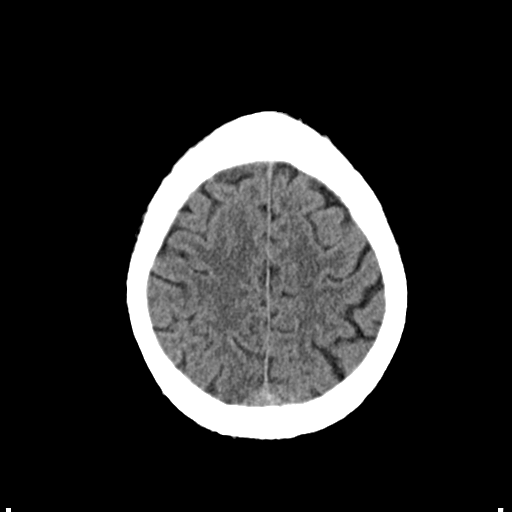
[im 29/34  brain]
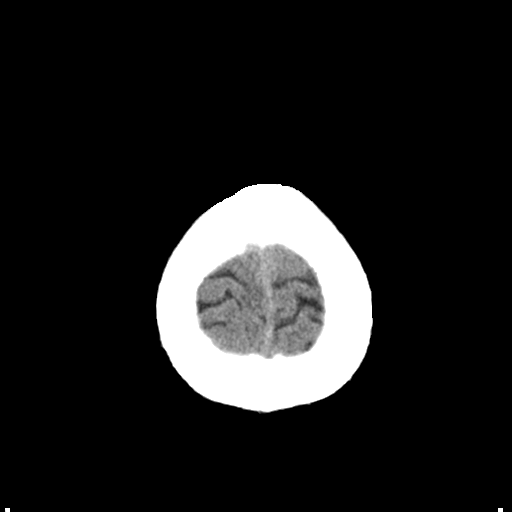

[Series 3: head bone · axial · 0.46mm/px · z∈[-114,-80]mm · 3 of 85 slices shown]
[im 9/85  bone]
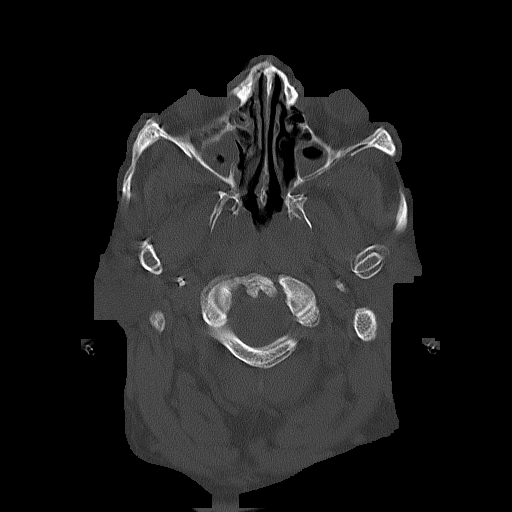
[im 17/85  bone]
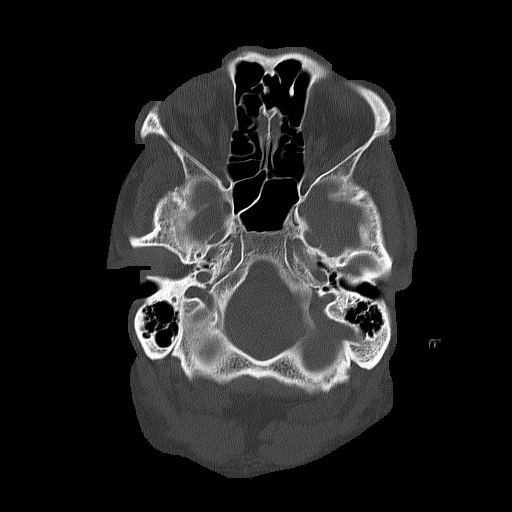
[im 26/85  bone]
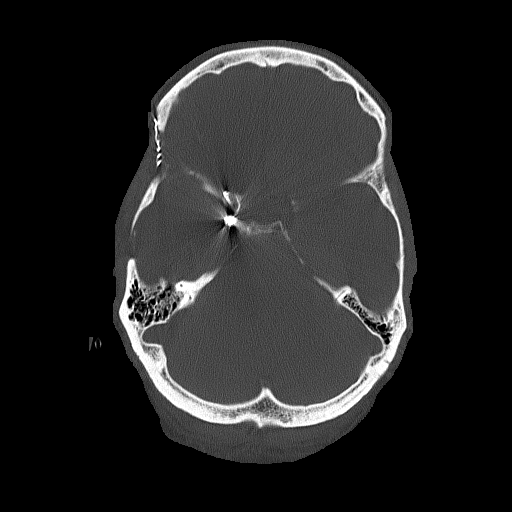

[Series 4: coronal soft tissue (person_name) · coronal · 0.34mm/px · 3 of 71 slices shown]
[im 24/71  brain]
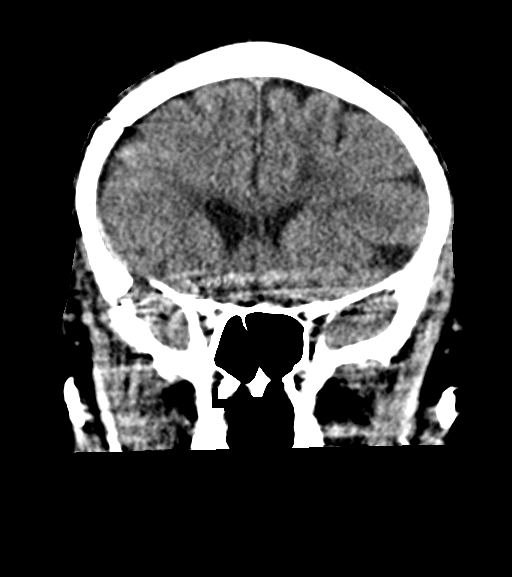
[im 32/71  brain]
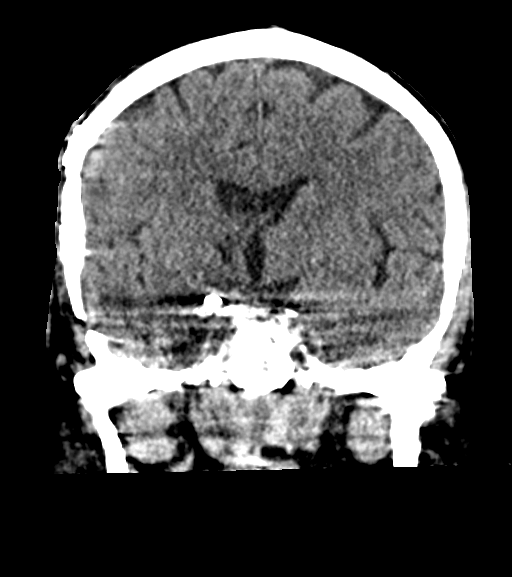
[im 39/71  brain]
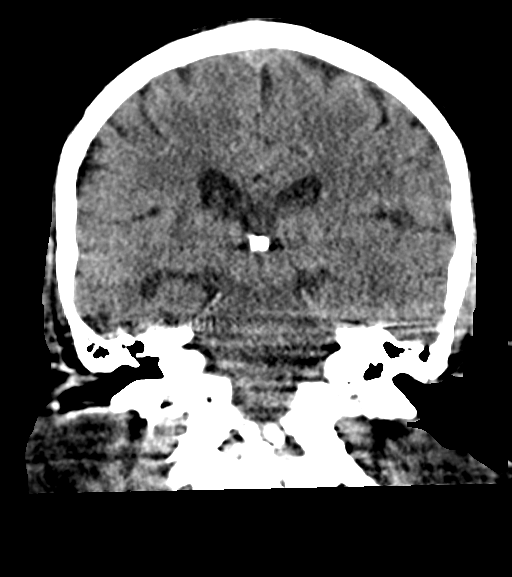

[Series 5: sagittal soft tissue · sagittal · 0.37mm/px · 3 of 58 slices shown]
[im 20/58  brain]
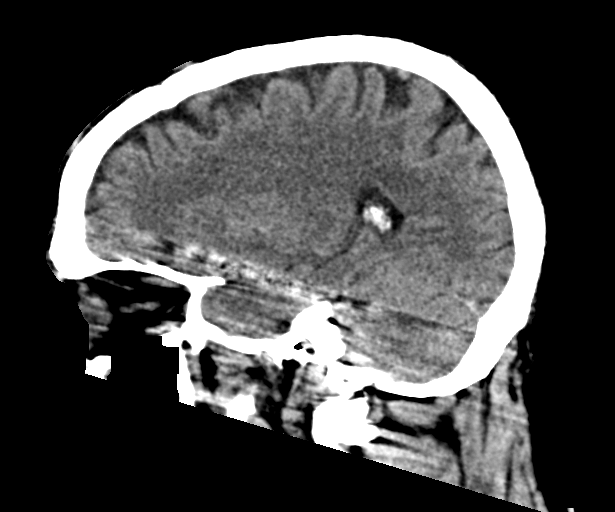
[im 29/58  brain]
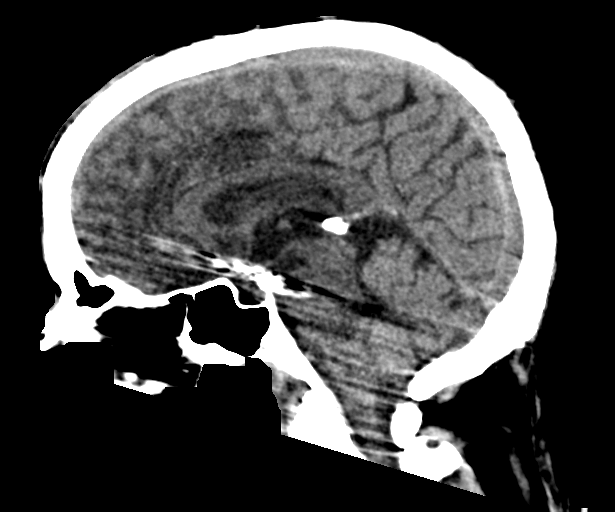
[im 39/58  brain]
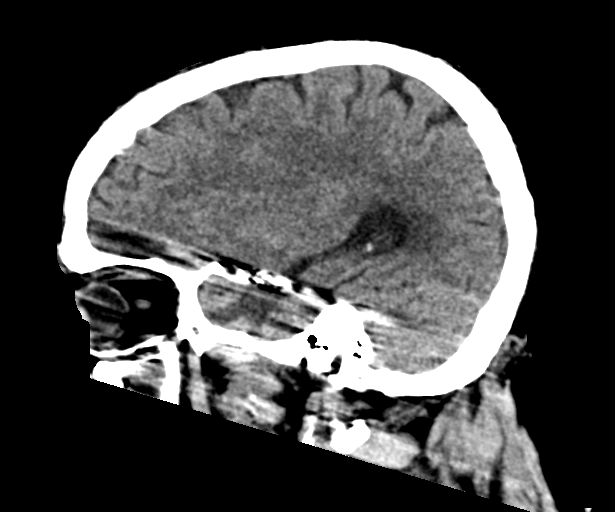

[16 of 47 positions shown; findings below may reference images not displayed]

FINDINGS: Brain: No evidence of acute intracranial hemorrhage or extra-axial
collection.Unchanged aneurysm clip in the vicinity of the right
carotid terminus within 7 malacia of the adjacent right temporal
lobe and lacunar infarct of the genu of the right internal capsule
and anterior thalamus.The ventricles are unchanged in size.

Vascular: No hyperdense vessel or unexpected calcification.

Skull: Prior right frontotemporal craniotomy with plate fixation
hardware and associated artifact. No acute fracture.

Sinuses/Orbits: Opacification in frothy material within the
maxillary sinuses. No acute findings in the orbits.

Other: None.
IMPRESSION: No acute intracranial abnormality.

Unchanged right-sided craniotomy and aneurysm clipping in the
vicinity of the right carotid terminus.

## 2021-11-25 MED ORDER — FUROSEMIDE 10 MG/ML IJ SOLN
80.0000 mg | Freq: Once | INTRAMUSCULAR | Status: AC
  Administered 2021-11-25: 80 mg via INTRAVENOUS
  Filled 2021-11-25: qty 8

## 2021-11-25 MED ORDER — AMLODIPINE BESYLATE 5 MG PO TABS
10.0000 mg | ORAL_TABLET | Freq: Once | ORAL | Status: AC
  Administered 2021-11-25: 10 mg via ORAL
  Filled 2021-11-25: qty 2

## 2021-11-25 MED ORDER — LIDOCAINE 5 % EX PTCH
1.0000 | MEDICATED_PATCH | CUTANEOUS | Status: DC
  Administered 2021-11-25 – 2021-11-28 (×4): 1 via TRANSDERMAL
  Filled 2021-11-25 (×4): qty 1

## 2021-11-25 MED ORDER — HYDRALAZINE HCL 20 MG/ML IJ SOLN
10.0000 mg | Freq: Once | INTRAMUSCULAR | Status: AC
  Administered 2021-11-25: 10 mg via INTRAVENOUS
  Filled 2021-11-25: qty 1

## 2021-11-25 MED ORDER — ACETAMINOPHEN 500 MG PO TABS
1000.0000 mg | ORAL_TABLET | Freq: Once | ORAL | Status: AC
  Administered 2021-11-25: 1000 mg via ORAL
  Filled 2021-11-25: qty 2

## 2021-11-25 MED ORDER — NITROGLYCERIN IN D5W 200-5 MCG/ML-% IV SOLN
0.0000 ug/min | INTRAVENOUS | Status: DC
  Administered 2021-11-25: 10 ug/min via INTRAVENOUS
  Filled 2021-11-25: qty 250

## 2021-11-25 MED ORDER — LISINOPRIL 10 MG PO TABS
20.0000 mg | ORAL_TABLET | Freq: Once | ORAL | Status: AC
  Administered 2021-11-25: 20 mg via ORAL
  Filled 2021-11-25: qty 2

## 2021-11-25 MED ORDER — IPRATROPIUM-ALBUTEROL 0.5-2.5 (3) MG/3ML IN SOLN
3.0000 mL | Freq: Once | RESPIRATORY_TRACT | Status: AC
  Administered 2021-11-25: 3 mL via RESPIRATORY_TRACT
  Filled 2021-11-25: qty 3

## 2021-11-25 NOTE — ED Notes (Signed)
Dr. Jari Pigg aware of troponin of 123

## 2021-11-25 NOTE — Assessment & Plan Note (Signed)
Likely due to his malignant hypertension.  Cannot diurese much given his AKI.  Will need better blood pressure control first.  Continue nitroglycerin drip.

## 2021-11-25 NOTE — Progress Notes (Signed)
RT called to room for BiPAP administration. Pt Placed on 10/5 R 10 21% with sat in high/mid 90's. Pt resting comfortably on these settings. Aerogen in-line for neb treatments.

## 2021-11-25 NOTE — ED Triage Notes (Signed)
Pt comes into the ED via POV c/ob [facility] that started several days ago.  Pt states he fell down a hill a couple days ago and landed on the right side of his ribs.  Pt also has h/o HTN and is noncompliant with meds.  Pt has even and unlabored respirations at this time.  Given 1 duoneb with EMS but was 99% RA.  Pt has symmetrical chest movement.

## 2021-11-25 NOTE — Assessment & Plan Note (Signed)
Acutely worsening.  Baseline creatinine 2.0.

## 2021-11-25 NOTE — Assessment & Plan Note (Signed)
Likely due to his malignant hypertension.  We will need to check an echo.  Continue nitroglycerin drip.  Continue BiPAP therapy.  Low threshold to transfer to ICU.

## 2021-11-25 NOTE — Assessment & Plan Note (Signed)
Check A1c.  Patient takes no meds at home.  Add sliding scale insulin.

## 2021-11-25 NOTE — ED Triage Notes (Signed)
Pt in via EMS from home with c/o SOB. Pt was given 1 albuterol and one duoneb. 99% on RA. Pt with hx of HTN and not compliant with meds. CBG 147

## 2021-11-25 NOTE — Assessment & Plan Note (Signed)
Patient tested positive for amphetamines as well.  Can only assume he has been using amphetamines as well as cocaine.

## 2021-11-25 NOTE — Progress Notes (Signed)
Patient has been weaned off bipap to 4 liter nasal cannula. Bipap on SB. RN aware

## 2021-11-25 NOTE — Assessment & Plan Note (Addendum)
Patient continues to abuse cocaine on a regular basis.  Avoid beta-blockade on this patient.  Patient admits to using cocaine prior to coming to the ER today.

## 2021-11-25 NOTE — ED Provider Notes (Addendum)
[facility] Emergency Department Provider Note  ____________________________________________   Event Date/Time   First MD Initiated Contact with Patient 11/25/21 1135     (approximate)  I have reviewed the triage vital signs and the nursing notes.   HISTORY  Chief Complaint Shortness of Breath and Hypertension    HPI Derek Cooley. is a 55 y.o. 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  Patient reports that about a week ago he was walking when he had a mechanical fall onto his right side.  He states that the next day he developed some shortness of breath.  This is been intermittent, worse with breathing.  Nothing makes it better.  He does report a mild headache but states that this is a typical stress headache for him.  He does report that he has been noncompliant with his blood pressure medicine.  He states that his sister used to help him do it and that she is not been helping him recently.  When I asked him about substance abuse he he states that he did have a friend who gave him something this morning to help with his anxiety and he thinks it might have been a cocaine.  He does report smoking.  On review of records patient is on amlodipine 10 mg, carvedilol 12.5 and lisinopril 20 mg.          Past Medical History:  Diagnosis Date   Abdominal abscess    Acute pancreatitis    Anxiety    Asthma    Back pain    Depression    Diabetes mellitus without complication ([place])    Emphysema of lung ([place])    right sided   GERD (gastroesophageal reflux disease)    Hemorrhage into subarachnoid space of neuraxis ([place]) 01/26/2010   Hypertension    Mild cognitive impairment    MRSA (methicillin resistant Staphylococcus aureus)    Obesity    Panic disorder    Perforated bowel ([place]) 2007   Tempoary  Colostomy Bag, Skin Graft for Abd wound   Sleep apnea    sleep study 2013   Subarachnoid hemorrhage ([place]) 2011   coil placed   Type 2 diabetes mellitus without complication, without long-term current use of insulin ([place]) 04/17/2016   Vitreous hemorrhage ([place]) 03/27/2011   Overview:  Bilateral; 01/2010, from [facility]     Patient Active Problem List   Diagnosis Date Noted   Nonadherence to medication 07/06/2020   Encounter for screening colonoscopy    AKI (acute kidney injury) ([place]) 08/16/2019   Altered mental status 08/16/2019   Polysubstance abuse ([place]) 08/16/2019   Hypertriglyceridemia 07/06/2019   Statin intolerance 01/02/2019   Acute bronchitis due to other specified organisms 12/01/2018   Hx of aneurysm 10/16/2017   Controlled type 2 diabetes mellitus without complication, without long-term current use of insulin ([place]) 04/17/2016   COPD (chronic obstructive pulmonary disease) ([place]) 04/17/2016   GERD (gastroesophageal reflux disease) 08/25/2015   High blood cholesterol 05/16/2015   Depression 05/16/2015   Chronic anxiety 05/16/2015   Benign hypertensive renal disease 05/16/2015   Persistent headaches 05/16/2015   CKD (chronic kidney disease) stage 2, GFR 60-89 ml/min 05/16/2015   Tobacco abuse 05/16/2015   Abdominal wall abscess 02/01/2014   Infection and inflammatory reaction due to other internal prosthetic devices, implants and  grafts, initial encounter ([place]) 01/05/2014   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[place]) 01/05/2014   Wound drainage 07/13/2013   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/2013   Obstructive apnea 06/23/2013   Cerebral aneurysm 06/23/2013   Mild cognitive disorder 03/27/2011   Mild cognitive impairment 03/27/2011   Vitreous hemorrhage ([place]) 03/27/2011   Hypertension associated with diabetes ([place]) 03/06/2011   Adiposity 03/06/2011    Past Surgical History:  Procedure Laterality Date   COLON SURGERY  2007   colostomy bag placed s/p  perforated bowel   COLONOSCOPY WITH PROPOFOL N/A 05/18/2020   Procedure: COLONOSCOPY WITH PROPOFOL;  Surgeon: Lin Landsman, MD;  Location: [facility] ENDOSCOPY;  Service: Gastroenterology;  Laterality: N/A;   KNEE SURGERY Right    Perforated bowel      Prior to Admission medications   Medication Sig Start Date End Date Taking? Authorizing Provider  albuterol (PROVENTIL) (2.5 MG/3ML) 0.083% nebulizer solution Take 3 mLs (2.5 mg total) by nebulization every 6 (six) hours as needed for wheezing or shortness of breath. 11/18/19   Volney American, PA-C  albuterol (VENTOLIN HFA) 108 (90 Base) MCG/ACT inhaler Inhale 1-2 puffs into the lungs every 6 (six) hours as needed for wheezing or shortness of breath. 04/17/21   Jon Billings, NP  amLODipine (NORVASC) 10 MG tablet Take 1 tablet (10 mg total) by mouth daily. 04/17/21   Jon Billings, NP  carvedilol (COREG) 12.5 MG tablet Take 1 tablet (12.5 mg total) by mouth 2 (two) times daily with a meal. 04/17/21   Jon Billings, NP  doxycycline (VIBRAMYCIN) 100 MG capsule TAKE 1 CAPSULE BY MOUTH TWICE DAILY Patient not taking: Reported on 05/30/2021 02/14/20   Volney American, PA-C  DULoxetine (CYMBALTA) 60 MG capsule Take 1 capsule (60 mg total) by mouth 2 (two) times daily. 04/17/21   Jon Billings, NP  fenofibrate micronized (LOFIBRA) 67 MG capsule TAKE 1 CAPSULE EVERY DAY BEFORE BREAKFAST 04/17/21   Jon Billings, NP  fluticasone [facility]) 50 MCG/ACT nasal spray Place 2 sprays into both nostrils daily. 05/30/18   Trinna Post, PA-C  furosemide (LASIX) 20 MG tablet Take 1 tablet (20 mg total) by mouth daily. 06/29/20   Volney American, PA-C  glipiZIDE (GLUCOTROL XL) 5 MG 24 hr tablet TAKE ONE TABLET EACH MORNING WITH BREAKFAST 04/17/21   Jon Billings, NP  glucose blood [facility] ULTRA) test strip USE TO TEST BLOOD SUGAR DAILY 08/23/20   Eulogio Bear, NP  hydrOXYzine (ATARAX/VISTARIL) 50 MG tablet TAKE ONE TABLET 3 TIMES  DAILY AS NEEDED 04/17/21   Jon Billings, NP  ibuprofen (ADVIL) 800 MG tablet Take 1 tablet (800 mg total) by mouth every 8 (eight) hours as needed. 08/07/19   Volney American, PA-C  lisinopril (ZESTRIL) 20 MG tablet Take 1 tablet (20 mg total) by mouth daily. 04/17/21   Jon Billings, NP  methocarbamol (ROBAXIN) 750 MG tablet Take 1,500 mg by mouth 4 (four) times daily. 02/25/20   [provider]  pantoprazole (PROTONIX) 40 MG tablet Take 1 tablet (40 mg total) by mouth 2 (two) times daily as needed. 04/17/21   Jon Billings, NP  pregabalin (LYRICA) 50 MG capsule Take 50 mg by mouth 4 (four) times daily.    [provider]  tiZANidine (ZANAFLEX) 4 MG tablet TAKE ONE TABLET TWICE DAILY AS NEEDED FOR MUSCLE SPASM 11/13/19   Volney American, PA-C  topiramate (TOPAMAX) 50 MG tablet 50 mg 2 (two) times daily.  01/28/19  [provider]  traZODone (DESYREL) 100 MG tablet Take 100 mg by mouth at bedtime.    [provider]  umeclidinium-vilanterol (ANORO ELLIPTA) 62.5-25 MCG/INH AEPB Inhale 1 puff into the lungs daily. Patient not taking: Reported on 05/30/2021 04/17/21   Jon Billings, NP    Allergies Atorvastatin, Avelox [moxifloxacin hcl in nacl], Buprenorphine, Dilaudid [hydromorphone hcl], Fluoxetine, Levofloxacin, Morphine and related, Other, Suboxone [buprenorphine hcl-naloxone hcl], and Vancomycin  Family History  Problem Relation Age of Onset   Arthritis Mother    Asthma Mother    Diabetes Mother    Heart disease Mother    Hyperlipidemia Mother    Hypertension Mother    Kidney disease Mother    Thyroid disease Mother    Lung disease Mother    Anxiety disorder Mother    Depression Mother    Diabetes Father    Heart disease Father    Depression Father    Anxiety disorder Father    Arthritis Sister    Asthma Sister    Hyperlipidemia Sister    Hypertension Sister    Lung disease Sister    Anxiety disorder Sister    Depression  Sister    Hyperlipidemia Brother    Hypertension Brother    Diabetes Sister    Heart disease Sister    Depression Sister    Anxiety disorder Sister    Anxiety disorder Brother    Depression Brother    Heart disease Brother     Social History Social History   Tobacco Use   Smoking status: Every Day    Packs/day: 2.00    Types: Cigarettes    Start date: 10/11/1984   Smokeless tobacco: Never  Vaping Use   Vaping Use: Never used  Substance Use Topics   Alcohol use: Yes    Alcohol/week: 0.0 standard drinks    Comment: occasionall    Drug use: No      Review of Systems Constitutional: No fever/chills Eyes: No visual changes. ENT: No sore throat. Cardiovascular: Right-sided chest pain Respiratory: Positive for SOB Gastrointestinal: Right upper abdominal pain no nausea, no vomiting.  No diarrhea.  No constipation. Genitourinary: Negative for dysuria. Musculoskeletal: Negative for back pain. Skin: Negative for rash. Neurological: Mild headache, no focal weakness or numbness. All other ROS negative ____________________________________________   PHYSICAL EXAM:  VITAL SIGNS: ED Triage Vitals  Enc Vitals Group     BP 11/25/21 1111 (!) 240/139     Pulse Rate 11/25/21 1111 94     Resp 11/25/21 1111 20     Temp 11/25/21 1111 98.6 F (37 C)     Temp Source 11/25/21 1111 Oral     SpO2 11/25/21 1111 95 %     Weight 11/25/21 1109 216 lb 14.9 oz (98.4 kg)     Height 11/25/21 1109 5\' 7"  (1.702 m)     Head Circumference --      Peak Flow --      Pain Score 11/25/21 1109 6     Pain Loc --      Pain Edu? --      Excl. in [place]? --     Constitutional: Alert and oriented. Well appearing and in no acute distress. Eyes: Conjunctivae are normal. EOMI. Head: Atraumatic. Nose: No congestion/rhinnorhea. Mouth/Throat: Mucous membranes are moist.   Neck: No stridor. Trachea Midline. FROM Cardiovascular: Normal rate, regular rhythm. Grossly normal heart sounds.  Good peripheral  circulation.  Tenderness in the right chest wall is reproducible.  Equal radial  pulses.  Equal pedal pulses. Respiratory: Wheezing bilaterally with no increased work of breathing Gastrointestinal: Soft and nontender. No distention. No abdominal bruits.  Musculoskeletal: No lower extremity tenderness nor edema.  No joint effusions. Neurologic:  Normal speech and language. No gross focal neurologic deficits are appreciated.  No numbness or no tingling Skin:  Skin is warm, dry and intact. No rash noted. Psychiatric: Mood and affect are normal. Speech and behavior are normal. GU: Deferred   ____________________________________________   LABS (all labs ordered are listed, but only abnormal results are displayed)  Labs Reviewed  CBC - Abnormal; Notable for the following components:      Result Value   WBC 11.1 (*)    RBC 3.37 (*)    Hemoglobin 10.4 (*)    HCT 31.6 (*)    All other components within normal limits  BASIC METABOLIC PANEL  TROPONIN I (HIGH SENSITIVITY)   ____________________________________________   ED ECG REPORT I, Vanessa [place], the attending physician, personally viewed and interpreted this ECG.  Normal sinus rate 96, no ST elevation, no T wave inversions, normal intervals ____________________________________________  RADIOLOGY Robert Bellow, personally viewed and evaluated these images (plain radiographs) as part of my medical decision making, as well as reviewing the written report by the radiologist.  ED MD interpretation: Some interstitial edema  Official radiology report(s): DG Chest 2 View  Result Date: 11/25/2021 CLINICAL DATA:  Right-sided chest wall pain. Short of breath. Patient reports that he fell down a hill a couple of days ago landing on his right side. EXAM: CHEST - 2 VIEW COMPARISON:  06/29/2020 FINDINGS: Cardiac silhouette normal in size. No mediastinal or hilar masses or evidence of adenopathy. There is interstitial thickening most evident in  the bases, including thickening along the oblique fissures. No lung consolidation. Trace bilateral pleural effusions. No pneumothorax. Skeletal structures are intact. IMPRESSION: 1. Interstitial thickening with trace pleural effusions. Suspect mild interstitial edema. 2. No visualized rib fracture. Electronically Signed   By: Lajean Manes M.D.   On: 11/25/2021 12:05    ____________________________________________   PROCEDURES  Procedure(s) performed (including Critical Care):  .1-3 Lead EKG Interpretation Performed by: Vanessa Chisholm, MD Authorized by: Vanessa Cass, MD     Interpretation: abnormal     ECG rate:  100s   ECG rate assessment: tachycardic     Rhythm: sinus tachycardia     Ectopy: none     Conduction: normal   .Critical Care Performed by: Vanessa [place], MD Authorized by: Vanessa [place], MD   Critical care provider statement:    Critical care time (minutes):  30   Critical care was time spent personally by me on the following activities:  Development of treatment plan with patient or surrogate, discussions with consultants, evaluation of patient's response to treatment, examination of patient, ordering and review of laboratory studies, ordering and review of radiographic studies, ordering and performing treatments and interventions, pulse oximetry, re-evaluation of patient's condition and review of old charts   ____________________________________________   INITIAL IMPRESSION / ASSESSMENT AND PLAN / ED COURSE   Derek Cooley. was evaluated in Emergency Department on 11/25/2021 for the symptoms described in the history of present illness. He was evaluated in the context of the global COVID-19 pandemic, which necessitated consideration that the patient might be at risk for infection with the SARS-CoV-2 virus that causes COVID-19. Institutional protocols and algorithms that pertain to the evaluation of patients at risk for COVID-19 are in  a state of rapid change based on  information released by regulatory bodies including the CDC and federal and state organizations. These policies and algorithms were followed during the patient's care in the ED.     Pt presents with SOB.  Suspect that this could be from patient's blood pressure versus from a fall and a rib fracture.  Given the history of aneurysmal repair and the mild headache will get CT head evaluate for intercranial hemorrhage but seems like the more pressing issue is the shortness of breath.  Patient does have some wheezing so could be from COPD.  We will give some duo nebs.  I had initially ordered a CT dissection to further evaluate for rib fractures as well rule out dissection due to patient's hypertension however patient's kidney function came back significantly elevated and this could lead to renal failure if we use contrast.  His chest x-ray without evidence of any widening.  He is got good distal pulses throughout: Without any numbness or tingling in his chest pain is reproducible on his right chest wall therefore dissection seems to be less likely at this time.  Do not feel like the risk of contrast in the setting of significant AKI is warranted at this time however will need to be closely monitored.  Again I suspect more than likely patient's pain is from his rib fracture seen on CT without contrast given his reproducible on his right chest wall.  Differential includes: PNA-will get xray to evaluation Anemia-CBC to evaluate ACS- will get trops Arrhythmia-Will get EKG and keep on monitor.  COVID- will get testing per algorithm. PE-lower suspicion given no risk factors and other cause more likely Dissection- considered but chest pain reproducible and started after fall  After DuoNeb patient became diaphoretic.  Patient having increased work of breathing, tripoding.  Patient still significantly hypertensive.  Will place patient on nitro drip given dose of Lasix and started on BiPAP.  Patient moved to the high  acuity area.  We will discussed with the hospital team for admission   Patient has new AKI and I suspect that he has having pulmonary edema from his hypertension.  Lower suspicion for dissection at this time given no chest pain.   ICU doctor came down to evaluate patient  Patient was able to be weaned off the BiPAP and is now on room air.  Patient looks much more comfortable with blood pressures being lowered.  Patient will be admitted to the hospital team              ____________________________________________   FINAL CLINICAL IMPRESSION(S) / ED DIAGNOSES   Final diagnoses:  Flash pulmonary edema ([place])  Hypertensive emergency  AKI (acute kidney injury) ([place])  Substance abuse ([place])     MEDICATIONS GIVEN DURING THIS VISIT:  Medications  lidocaine (LIDODERM) 5 % 1 patch (1 patch Transdermal Patch Applied 11/25/21 1219)  nitroGLYCERIN 50 mg in dextrose 5 % 250 mL (0.2 mg/mL) infusion (10 mcg/min Intravenous New Bag/Given 11/25/21 1355)  ipratropium-albuterol (DUONEB) 0.5-2.5 (3) MG/3ML nebulizer solution 3 mL (3 mLs Nebulization Given 11/25/21 1218)  hydrALAZINE (APRESOLINE) injection 10 mg (10 mg Intravenous Given 11/25/21 1218)  lisinopril (ZESTRIL) tablet 20 mg (20 mg Oral Given 11/25/21 1218)  amLODipine (NORVASC) tablet 10 mg (10 mg Oral Given 11/25/21 1218)  acetaminophen (TYLENOL) tablet 1,000 mg (1,000 mg Oral Given 11/25/21 1218)  furosemide (LASIX) injection 80 mg (80 mg Intravenous Given 11/25/21 1345)     ED Discharge Orders  None        Note:  This document was prepared using Dragon voice recognition software and may include unintentional dictation errors.   Vanessa [place], MD 11/25/21 1512    Vanessa [place], MD 11/25/21 [phone]

## 2021-11-25 NOTE — ED Notes (Signed)
Patient shouted out. Patient was found at the end of the stretcher, labored breathing, tachypnea and diaphoretic. Dr. Jari Pigg called to bedside. Patient states he can't breath. Patient placed on 3L O2.  Sats were in mid-90's before O2 applied.

## 2021-11-25 NOTE — Subjective & Objective (Signed)
Chief complaint: Shortness of breath History of present illness: 55 year old male with a history of polysubstance abuse, noncompliance with therapy, hypertension, history of subarachnoid hemorrhage, diabetes type 2, chronic kidney disease type II, history of MRSA infected abdominal mesh, who presents to the ER today with shortness of breath.  Patient arrived via EMS.  Patient states shortness of breath started today.  Admits to using cocaine prior to coming to the hospital due to "panic attack".  On arrival to the ER, patient was in respiratory extremis.  He had been given an albuterol and a DuoNeb via EMS.  Patient relayed to the ER provider that he had fallen down a hill couple days with some right-sided pain.  Due to respiratory extremis, the patient was placed on BiPAP therapy.  He was also extremely hypertensive with initial systolic pressure of 269 and a diastolic of 485.  He was placed on nitroglycerin drip.    CT chest demonstrated some subtle nondisplaced right fourth fifth sixth rib fractures.  There is some interstitial pulmonary edema.  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  Chest x-ray demonstrated interstitial edema. EKG showed normal sinus rhythm.  Due to the patient's malignant hypertension, pulmonary edema, acute kidney injury, [facility] hospitalist contacted for admission.  PCCM has declined admission to the ICU.

## 2021-11-25 NOTE — H&P (Signed)
History and Physical    Derek S Barrack Jr. NAT:[id number] DOB: 07/19/1966 DOA: 11/25/2021  PCP: Jon Billings, NP   Patient coming from: Home  I have personally briefly reviewed patient's old medical records in [place]  Chief complaint: Shortness of breath History of present illness: 55 year old male with a history of polysubstance abuse, noncompliance with therapy, hypertension, history of subarachnoid hemorrhage, diabetes type 2, chronic kidney disease type II, history of MRSA infected abdominal mesh, who presents to the ER today with shortness of breath.  Patient arrived via EMS.  Patient states shortness of breath started today.  Admits to using cocaine prior to coming to the hospital due to "panic attack".  On arrival to the ER, patient was in respiratory extremis.  He had been given an albuterol and a DuoNeb via EMS.  Patient relayed to the ER provider that he had fallen down a hill couple days with some right-sided pain.  Due to respiratory extremis, the patient was placed on BiPAP therapy.  He was also extremely hypertensive with initial systolic pressure of 427 and a diastolic of 062.  He was placed on nitroglycerin drip.    CT chest demonstrated some subtle nondisplaced right fourth fifth sixth rib fractures.  There is some interstitial pulmonary edema.  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  Chest x-ray demonstrated interstitial edema. EKG showed normal sinus rhythm.  Due to the patient's malignant hypertension, pulmonary edema, acute kidney injury, [facility] hospitalist contacted for admission.  PCCM has declined admission to the ICU.   ED Course:   In respiratory extremis. Started on Bipap and NTG gtts  Review of Systems:  Review of Systems  HENT: Negative.    Eyes: Negative.   Respiratory:  Positive for shortness of breath.   Cardiovascular:  Positive for orthopnea.  Gastrointestinal: Negative.    Genitourinary: Negative.   Musculoskeletal: Negative.   Skin: Negative.   Neurological: Negative.   Endo/Heme/Allergies: Negative.   Psychiatric/Behavioral:  Positive for substance abuse. The patient is nervous/anxious.   All other systems reviewed and are negative.  Past Medical History:  Diagnosis Date   Abdominal abscess    Acute pancreatitis    Anxiety    Asthma    Back pain    Depression    Diabetes mellitus without complication ([place])    Emphysema of lung ([place])    right sided   GERD (gastroesophageal reflux disease)    Hemorrhage into subarachnoid space of neuraxis ([place]) 01/26/2010   Hypertension    Mild cognitive impairment    MRSA (methicillin resistant Staphylococcus aureus)    Obesity    Panic disorder    Perforated bowel ([place]) 2007   Tempoary Colostomy Bag, Skin Graft for Abd wound   Sleep apnea    sleep study 2013   Subarachnoid hemorrhage ([place]) 2011   coil placed   Type 2 diabetes mellitus without complication, without long-term current use of insulin ([place]) 04/17/2016   Vitreous hemorrhage ([place]) 03/27/2011   Overview:  Bilateral; 01/2010, from [facility]     Past Surgical History:  Procedure Laterality Date   COLON SURGERY  2007   colostomy bag placed s/p perforated bowel   COLONOSCOPY WITH PROPOFOL N/A 05/18/2020   Procedure: COLONOSCOPY WITH PROPOFOL;  Surgeon: Lin Landsman, MD;  Location: [facility] ENDOSCOPY;  Service: Gastroenterology;  Laterality: N/A;   KNEE SURGERY Right    Perforated bowel       reports that he has been smoking cigarettes.  He started smoking about 37 years ago. He has been smoking an average of 2 packs per day. He has never used smokeless tobacco. He reports current alcohol use. He reports current drug use. Drugs: "Crack" cocaine and Amphetamines.  Allergies  Allergen Reactions   Atorvastatin     Joint Aches - Severe Joint Aches - Severe Joint Aches - Severe   Avelox [Moxifloxacin Hcl In Nacl]     Muscle pain   Buprenorphine      Mouth sores, confusion, shaking   Dilaudid [Hydromorphone Hcl] Hives   Fluoxetine Itching   Levofloxacin Other (See Comments)    Joint Pain   Morphine And Related    Other     Muscle pain   Suboxone [Buprenorphine Hcl-Naloxone Hcl] Other (See Comments)    Rash and confused   Vancomycin     Renal insufficiency    Family History  Problem Relation Age of Onset   Arthritis Mother    Asthma Mother    Diabetes Mother    Heart disease Mother    Hyperlipidemia Mother    Hypertension Mother    Kidney disease Mother    Thyroid disease Mother    Lung disease Mother    Anxiety disorder Mother    Depression Mother    Diabetes Father    Heart disease Father    Depression Father    Anxiety disorder Father    Arthritis Sister    Asthma Sister    Hyperlipidemia Sister    Hypertension Sister    Lung disease Sister    Anxiety disorder Sister    Depression Sister    Hyperlipidemia Brother    Hypertension Brother    Diabetes Sister    Heart disease Sister    Depression Sister    Anxiety disorder Sister    Anxiety disorder Brother    Depression Brother    Heart disease Brother     Prior to Admission medications   Medication Sig Start Date End Date Taking? Authorizing Provider  albuterol (PROVENTIL) (2.5 MG/3ML) 0.083% nebulizer solution Take 3 mLs (2.5 mg total) by nebulization every 6 (six) hours as needed for wheezing or shortness of breath. 11/18/19   Volney American, PA-C  albuterol (VENTOLIN HFA) 108 (90 Base) MCG/ACT inhaler Inhale 1-2 puffs into the lungs every 6 (six) hours as needed for wheezing or shortness of breath. 04/17/21   Jon Billings, NP  amLODipine (NORVASC) 10 MG tablet Take 1 tablet (10 mg total) by mouth daily. 04/17/21   Jon Billings, NP  carvedilol (COREG) 12.5 MG tablet Take 1 tablet (12.5 mg total) by mouth 2 (two) times daily with a meal. 04/17/21   Jon Billings, NP  doxycycline (VIBRAMYCIN) 100 MG capsule TAKE 1 CAPSULE BY MOUTH TWICE  DAILY Patient not taking: Reported on 05/30/2021 02/14/20   Volney American, PA-C  DULoxetine (CYMBALTA) 60 MG capsule Take 1 capsule (60 mg total) by mouth 2 (two) times daily. 04/17/21   Jon Billings, NP  fenofibrate micronized (LOFIBRA) 67 MG capsule TAKE 1 CAPSULE EVERY DAY BEFORE BREAKFAST 04/17/21   Jon Billings, NP  fluticasone [facility]) 50 MCG/ACT nasal spray Place 2 sprays into both nostrils daily. 05/30/18   Trinna Post, PA-C  furosemide (LASIX) 20 MG tablet Take 1 tablet (20 mg total) by mouth daily. 06/29/20   Volney American, PA-C  glipiZIDE (GLUCOTROL XL) 5 MG 24 hr tablet TAKE ONE TABLET EACH MORNING WITH BREAKFAST 04/17/21   Jon Billings, NP  glucose blood (  ONETOUCH ULTRA) test strip USE TO TEST BLOOD SUGAR DAILY 08/23/20   Eulogio Bear, NP  hydrOXYzine (ATARAX/VISTARIL) 50 MG tablet TAKE ONE TABLET 3 TIMES DAILY AS NEEDED 04/17/21   Jon Billings, NP  ibuprofen (ADVIL) 800 MG tablet Take 1 tablet (800 mg total) by mouth every 8 (eight) hours as needed. 08/07/19   Volney American, PA-C  lisinopril (ZESTRIL) 20 MG tablet Take 1 tablet (20 mg total) by mouth daily. 04/17/21   Jon Billings, NP  methocarbamol (ROBAXIN) 750 MG tablet Take 1,500 mg by mouth 4 (four) times daily. 02/25/20   [provider]  pantoprazole (PROTONIX) 40 MG tablet Take 1 tablet (40 mg total) by mouth 2 (two) times daily as needed. 04/17/21   Jon Billings, NP  pregabalin (LYRICA) 50 MG capsule Take 50 mg by mouth 4 (four) times daily.    [provider]  tiZANidine (ZANAFLEX) 4 MG tablet TAKE ONE TABLET TWICE DAILY AS NEEDED FOR MUSCLE SPASM 11/13/19   Volney American, PA-C  topiramate (TOPAMAX) 50 MG tablet 50 mg 2 (two) times daily.  01/28/19   [provider]  traZODone (DESYREL) 100 MG tablet Take 100 mg by mouth at bedtime.    [provider]  umeclidinium-vilanterol (ANORO ELLIPTA) 62.5-25 MCG/INH AEPB Inhale 1 puff into  the lungs daily. Patient not taking: Reported on 05/30/2021 04/17/21   Jon Billings, NP    Physical Exam: Vitals:   11/25/21 1245 11/25/21 1300 11/25/21 1345 11/25/21 1400  BP: (!) 263/115 (!) 208/118  (!) 194/105  Pulse: (!) 112 (!) 101 95 95  Resp: (!) 31 18 (!) 25 (!) 24  Temp:      TempSrc:      SpO2: 100% 100% 96% 96%  Weight:      Height:        Physical Exam Vitals and nursing note reviewed.  Constitutional:      General: He is not in acute distress.    Appearance: He is obese. He is not toxic-appearing or diaphoretic.     Comments: Wearing bipap mask  HENT:     Head: Normocephalic and atraumatic.     Nose: Nose normal. No rhinorrhea.  Eyes:     General:        Right eye: No discharge.        Left eye: No discharge.  Cardiovascular:     Rate and Rhythm: Normal rate and regular rhythm.  Pulmonary:     Breath sounds: Decreased air movement present.     Comments: Auscultation difficult due to BiPAP therapy.  Decreased air movement.  No wheezing.   Abdominal:     General: There is no distension.     Tenderness: There is no rebound.     Comments: Multiple scars on the abdomen.  No drainage.  Scars well-healed.  Musculoskeletal:     Comments: Trace pedal edema bilaterally.  Skin:    General: Skin is warm and dry.     Capillary Refill: Capillary refill takes less than 2 seconds.  Neurological:     Mental Status: He is oriented to person, place, and time.     Labs on Admission: I have personally reviewed following labs and imaging studies  CBC: Recent Labs  Lab 11/25/21 1117  WBC 11.1*  HGB 10.4*  HCT 31.6*  MCV 93.8  PLT 657   Basic Metabolic Panel: Recent Labs  Lab 11/25/21 1117  NA 140  K 3.9  CL 110  CO2 24  GLUCOSE 116*  BUN 41*  CREATININE 3.45*  CALCIUM 8.4*   GFR: Estimated Creatinine Clearance: 26.7 mL/min (A) (by C-G formula based on SCr of 3.45 mg/dL (H)). Liver Function Tests: Recent Labs  Lab 11/25/21 1117  AST 42*  ALT  32  ALKPHOS 99  BILITOT 0.6  PROT 6.9  ALBUMIN 3.3*   Recent Labs  Lab 11/25/21 1117  LIPASE 37   No results for input(s): AMMONIA in the last 168 hours. Coagulation Profile: No results for input(s): INR, PROTIME in the last 168 hours. Cardiac Enzymes: No results for input(s): CKTOTAL, CKMB, CKMBINDEX, TROPONINI in the last 168 hours. BNP (last 3 results) No results for input(s): PROBNP in the last 8760 hours. HbA1C: No results for input(s): HGBA1C in the last 72 hours. CBG: No results for input(s): GLUCAP in the last 168 hours. Lipid Profile: No results for input(s): CHOL, HDL, LDLCALC, TRIG, CHOLHDL, LDLDIRECT in the last 72 hours. Thyroid Function Tests: No results for input(s): TSH, T4TOTAL, FREET4, T3FREE, THYROIDAB in the last 72 hours. Anemia Panel: No results for input(s): VITAMINB12, FOLATE, FERRITIN, TIBC, IRON, RETICCTPCT in the last 72 hours. Urine analysis:    Component Value Date/Time   COLORURINE YELLOW (A) 11/25/2021 1210   APPEARANCEUR CLEAR (A) 11/25/2021 1210   APPEARANCEUR Clear 04/17/2021 1544   LABSPEC 1.015 11/25/2021 1210   LABSPEC 1.029 06/26/2012 1231   PHURINE 6.0 11/25/2021 1210   GLUCOSEU NEGATIVE 11/25/2021 1210   GLUCOSEU Negative 06/26/2012 1231   HGBUR SMALL (A) 11/25/2021 1210   BILIRUBINUR NEGATIVE 11/25/2021 1210   BILIRUBINUR Negative 04/17/2021 1544   BILIRUBINUR Negative 06/26/2012 1231   KETONESUR NEGATIVE 11/25/2021 1210   PROTEINUR >=300 (A) 11/25/2021 1210   NITRITE NEGATIVE 11/25/2021 1210   LEUKOCYTESUR NEGATIVE 11/25/2021 1210   LEUKOCYTESUR Negative 06/26/2012 1231    Drugs of Abuse     Component Value Date/Time   LABOPIA NONE DETECTED 11/25/2021 1210   COCAINSCRNUR POSITIVE (A) 11/25/2021 1210   LABBENZ NONE DETECTED 11/25/2021 1210   AMPHETMU POSITIVE (A) 11/25/2021 1210   THCU NONE DETECTED 11/25/2021 1210   LABBARB NONE DETECTED 11/25/2021 1210      Radiological Exams on Admission: I have personally  reviewed images DG Chest 2 View  Result Date: 11/25/2021 CLINICAL DATA:  Right-sided chest wall pain. Short of breath. Patient reports that he fell down a hill a couple of days ago landing on his right side. EXAM: CHEST - 2 VIEW COMPARISON:  06/29/2020 FINDINGS: Cardiac silhouette normal in size. No mediastinal or hilar masses or evidence of adenopathy. There is interstitial thickening most evident in the bases, including thickening along the oblique fissures. No lung consolidation. Trace bilateral pleural effusions. No pneumothorax. Skeletal structures are intact. IMPRESSION: 1. Interstitial thickening with trace pleural effusions. Suspect mild interstitial edema. 2. No visualized rib fracture. Electronically Signed   By: Lajean Manes M.D.   On: 11/25/2021 12:05   CT HEAD WO CONTRAST (5MM)  Result Date: 11/25/2021 CLINICAL DATA:  Subarachnoid hemorrhage [facility]) EXAM: CT HEAD WITHOUT CONTRAST TECHNIQUE: Contiguous axial images were obtained from the base of the skull through the vertex without intravenous contrast. 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 Vascular: No hyperdense vessel or unexpected calcification. Skull: Prior  right frontotemporal craniotomy with plate fixation hardware and associated artifact. No acute fracture. Sinuses/Orbits: Opacification in frothy material within the maxillary sinuses. No acute findings in the orbits. Other: None. IMPRESSION: No acute intracranial abnormality. 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 Electronically Signed   By: Maurine Simmering M.D.   On: 11/25/2021 13:58   CT CHEST ABDOMEN PELVIS WO  CONTRAST  Result Date: 11/25/2021 CLINICAL DATA:  Pt comes into the ED via POV c/ob [facility] that started several days ago. Pt states he fell down a hill a couple days ago and landed on the right side of his ribs. Pt also has h/o HTN and is noncompliant with meds. Pt has even and unlabored respirations at this time. EXAM: CT CHEST, ABDOMEN AND PELVIS WITHOUT CONTRAST TECHNIQUE: Multidetector CT imaging of the chest, abdomen and pelvis was performed following the standard protocol without IV contrast. COMPARISON:  07/24/2015 FINDINGS: CT CHEST FINDINGS Cardiovascular: Heart normal in size. No pericardial effusion. 3 vessel coronary artery calcifications. Great vessels are normal in caliber. Mediastinum/Nodes: No neck base, mediastinal or hilar masses or enlarged lymph nodes. Trachea and esophagus are unremarkable. Lungs/Pleura: Bilateral interstitial thickening, most prominent in the bases. There is additional mild dependent lung base opacity in the lower lobes consistent with atelectasis. No lung consolidation to suggest pneumonia. Trace pleural effusions. No pneumothorax. Musculoskeletal: Subtle nondisplaced fractures of the anterior right fourth, fifth and sixth ribs. No other acute fractures. There are additional old healed right rib fractures. No bone lesions. CT ABDOMEN PELVIS FINDINGS Hepatobiliary: No focal liver abnormality is seen. No gallstones, gallbladder wall thickening, or biliary dilatation. Pancreas: Unremarkable. No pancreatic ductal dilatation or surrounding inflammatory changes. Spleen: Normal in size without focal abnormality. Adrenals/Urinary Tract: No adrenal masses. Kidneys normal in size, orientation and position. No renal masses or stones. No hydronephrosis. Mild bilateral perinephric stranding, presumed chronic. Ureters are normal in course and in caliber. Bladder is unremarkable. Stomach/Bowel: Normal stomach. Bowel anastomosis staple line noted along the mid sigmoid colon. Small bowel and  colon are normal in caliber. No wall thickening. No inflammation. Normal appendix visualized. Vascular/Lymphatic: Aortic atherosclerosis. No aneurysm. No enlarged lymph nodes. Reproductive: Unremarkable. Other: Anterior abdominal wall midline incision. No hernia. No ascites. Musculoskeletal: No fracture or acute finding.  No bone lesion. IMPRESSION: 1. Subtle nondisplaced fractures of the anterior right fourth, fifth and sixth ribs, which appear acute. 2. Lungs with bilateral interstitial thickening and trace pleural effusions, findings consistent with interstitial pulmonary edema. 3. No other acute abnormality within the chest, abdomen or pelvis. 4. Aortic atherosclerosis. Electronically Signed   By: Lajean Manes M.D.   On: 11/25/2021 14:00    EKG: I have personally reviewed EKG: NSR    Assessment/Plan Principal Problem:   Malignant hypertension Active Problems:   AKI (acute kidney injury) ([place])   Flash pulmonary edema (H[facility])   CKD (chronic kidney disease) stage 2, GFR 60-89 ml/min   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 GFR 60-89 (HCC)   Cocaine abuse ([place])   Amphetamine abuse ([place])    Malignant hypertension Admit to progressive telemetry bed. Continue with bipap and ntg gtts. PCCM has evaluated patient and declined admission to ICU. Likely pt's ambulatory BP is always high as he is non-compliant with taking home HTN meds. Pt states he takes no meds at home. Would not target ideal blood pressures for this patient(i.e. SBP 120s) as his ambulatory SBPs are probably in the 160-180s.  However,  given his flash pulmonary edema, he will need better control. Consider once a day meds or clonidine patch(weekly).  AKI (acute kidney injury) ([place]) Likely due to his malignant hypertension.  Cannot diurese much given his AKI.  Will need better blood pressure control first.  Continue nitroglycerin drip.  Flash pulmonary edema (HCC) Likely due to his malignant hypertension.  We will need to  check an echo.  Continue nitroglycerin drip.  Continue BiPAP therapy.  Low threshold to transfer to ICU.  CKD (chronic kidney disease) stage 2, GFR 60-89 ml/min Acutely worsening.  Baseline creatinine 2.0.  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 GFR 60-89 (HCC) Check A1c.  Patient takes no meds at home.  Add sliding scale insulin.  Cocaine abuse [facility]) Patient continues to abuse cocaine on a regular basis.  Avoid beta-blockade on this patient.  Patient admits to using cocaine prior to coming to the ER today.  Amphetamine abuse ([place]) Patient tested positive for amphetamines as well.  Can only assume he has been using amphetamines as well as cocaine.  DVT prophylaxis: Lovenox Code Status: Full Code Family Communication: no family at bedside  Disposition Plan: return home  Consults called: PCCM  Admission status: Inpatient, Telemetry bed   Kristopher Oppenheim, DO [facility] 11/25/2021, 3:46 PM

## 2021-11-25 NOTE — Assessment & Plan Note (Signed)
Admit to progressive telemetry bed. Continue with bipap and ntg gtts. PCCM has evaluated patient and declined admission to ICU. Likely pt's ambulatory BP is always high as he is non-compliant with taking home HTN meds. Pt states he takes no meds at home. Would not target ideal blood pressures for this patient(i.e. SBP 120s) as his ambulatory SBPs are probably in the 160-180s.  However, given his flash pulmonary edema, he will need better control. Consider once a day meds or clonidine patch(weekly).

## 2021-11-26 ENCOUNTER — Inpatient Hospital Stay: Payer: Medicare Other

## 2021-11-26 ENCOUNTER — Inpatient Hospital Stay: Admit: 2021-11-26 | Payer: Medicare Other

## 2021-11-26 DIAGNOSIS — R778 Other specified abnormalities of plasma proteins: Secondary | ICD-10-CM

## 2021-11-26 DIAGNOSIS — I161 Hypertensive emergency: Secondary | ICD-10-CM

## 2021-11-26 DIAGNOSIS — I509 Heart failure, unspecified: Secondary | ICD-10-CM

## 2021-11-26 LAB — BASIC METABOLIC PANEL
Anion gap: 8 (ref 5–15)
BUN: 42 mg/dL — ABNORMAL HIGH (ref 6–20)
CO2: 26 mmol/L (ref 22–32)
Calcium: 8.5 mg/dL — ABNORMAL LOW (ref 8.9–10.3)
Chloride: 107 mmol/L (ref 98–111)
Creatinine, Ser: 3.84 mg/dL — ABNORMAL HIGH (ref 0.61–1.24)
GFR, Estimated: 18 mL/min — ABNORMAL LOW (ref >60)
Glucose, Bld: 155 mg/dL — ABNORMAL HIGH (ref 70–99)
Potassium: 4 mmol/L (ref 3.5–5.1)
Sodium: 141 mmol/L (ref 135–145)

## 2021-11-26 LAB — COMPREHENSIVE METABOLIC PANEL
ALT: 21 U/L (ref 0–44)
AST: 19 U/L (ref 15–41)
Albumin: 2.9 g/dL — ABNORMAL LOW (ref 3.5–5.0)
Alkaline Phosphatase: 94 U/L (ref 38–126)
Anion gap: 6 (ref 5–15)
BUN: 46 mg/dL — ABNORMAL HIGH (ref 6–20)
CO2: 28 mmol/L (ref 22–32)
Calcium: 8.2 mg/dL — ABNORMAL LOW (ref 8.9–10.3)
Chloride: 105 mmol/L (ref 98–111)
Creatinine, Ser: 4.17 mg/dL — ABNORMAL HIGH (ref 0.61–1.24)
GFR, Estimated: 16 mL/min — ABNORMAL LOW (ref >60)
Glucose, Bld: 132 mg/dL — ABNORMAL HIGH (ref 70–99)
Potassium: 4.4 mmol/L (ref 3.5–5.1)
Sodium: 139 mmol/L (ref 135–145)
Total Bilirubin: 0.7 mg/dL (ref 0.3–1.2)
Total Protein: 6.1 g/dL — ABNORMAL LOW (ref 6.5–8.1)

## 2021-11-26 LAB — MAGNESIUM: Magnesium: 2 mg/dL (ref 1.7–2.4)

## 2021-11-26 LAB — CBC WITH DIFFERENTIAL/PLATELET
Abs Immature Granulocytes: 0.03 10*3/uL (ref 0.00–0.07)
Basophils Absolute: 0.1 10*3/uL (ref 0.0–0.1)
Basophils Relative: 1 %
Eosinophils Absolute: 0.3 10*3/uL (ref 0.0–0.5)
Eosinophils Relative: 3 %
HCT: 29.7 % — ABNORMAL LOW (ref 39.0–52.0)
Hemoglobin: 10 g/dL — ABNORMAL LOW (ref 13.0–17.0)
Immature Granulocytes: 0 %
Lymphocytes Relative: 25 %
Lymphs Abs: 2.4 10*3/uL (ref 0.7–4.0)
MCH: 31.1 pg (ref 26.0–34.0)
MCHC: 33.7 g/dL (ref 30.0–36.0)
MCV: 92.2 fL (ref 80.0–100.0)
Monocytes Absolute: 0.7 10*3/uL (ref 0.1–1.0)
Monocytes Relative: 7 %
Neutro Abs: 6.3 10*3/uL (ref 1.7–7.7)
Neutrophils Relative %: 64 %
Platelets: 279 10*3/uL (ref 150–400)
RBC: 3.22 MIL/uL — ABNORMAL LOW (ref 4.22–5.81)
RDW: 13.3 % (ref 11.5–15.5)
WBC: 9.9 10*3/uL (ref 4.0–10.5)
nRBC: 0 % (ref 0.0–0.2)

## 2021-11-26 LAB — PROTEIN / CREATININE RATIO, URINE
Creatinine, Urine: 133 mg/dL
Protein Creatinine Ratio: 1.63 mg/mg{Cre} — ABNORMAL HIGH (ref 0.00–0.15)
Total Protein, Urine: 218 mg/dL

## 2021-11-26 LAB — CBG MONITORING, ED
Glucose-Capillary: 136 mg/dL — ABNORMAL HIGH (ref 70–99)
Glucose-Capillary: 129 mg/dL — ABNORMAL HIGH (ref 70–99)
Glucose-Capillary: 113 mg/dL — ABNORMAL HIGH (ref 70–99)

## 2021-11-26 LAB — PHOSPHORUS: Phosphorus: 4.5 mg/dL (ref 2.5–4.6)

## 2021-11-26 IMAGING — US US RENAL
1 series · 14 of 25 positions shown · non-contrast
Comparison: CT, [DATE].

CLINICAL DATA: Acute kidney injury.

EXAM:
RENAL / URINARY TRACT ULTRASOUND COMPLETE

[Series 1: us renal · 14 of 50 slices shown]
[im 1/50]
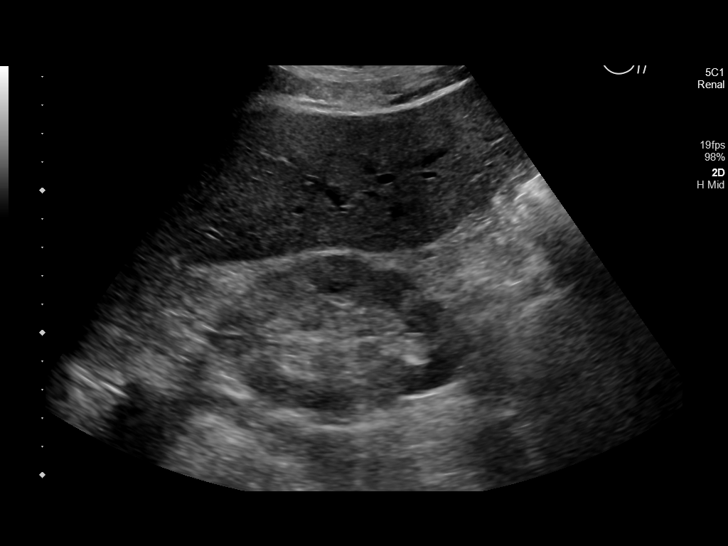
[im 5/50]
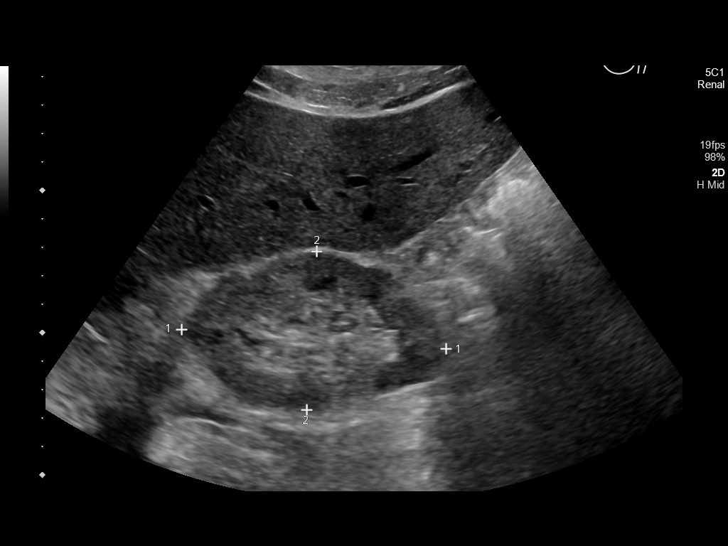
[im 9/50]
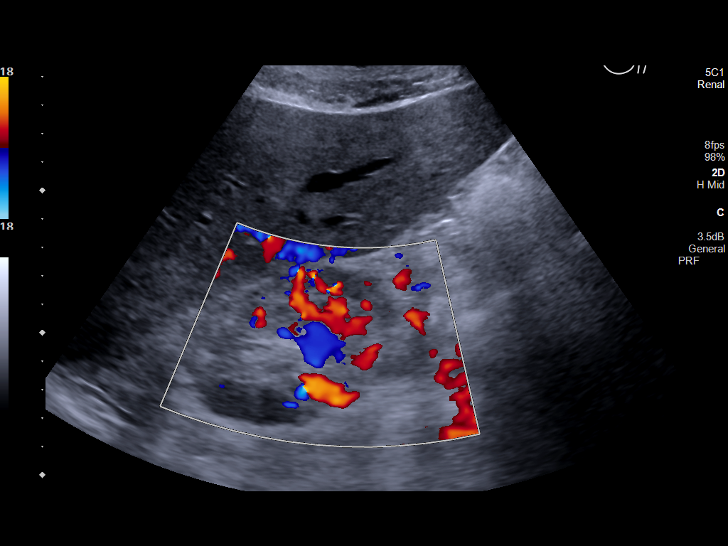
[im 13/50]
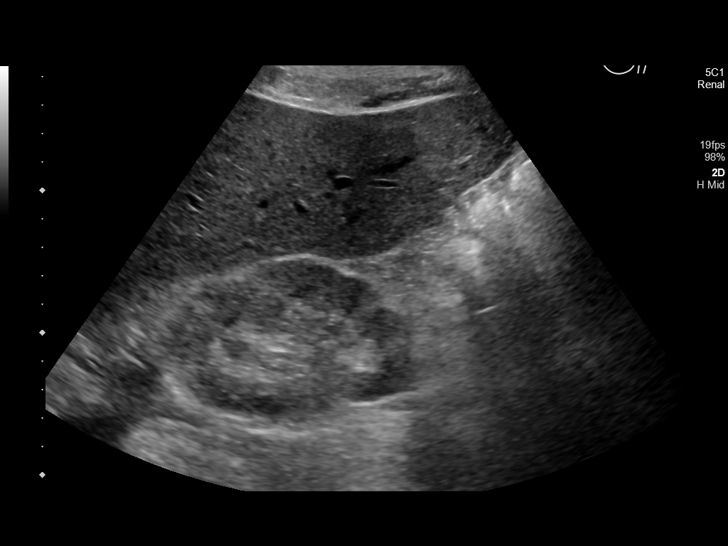
[im 17/50]
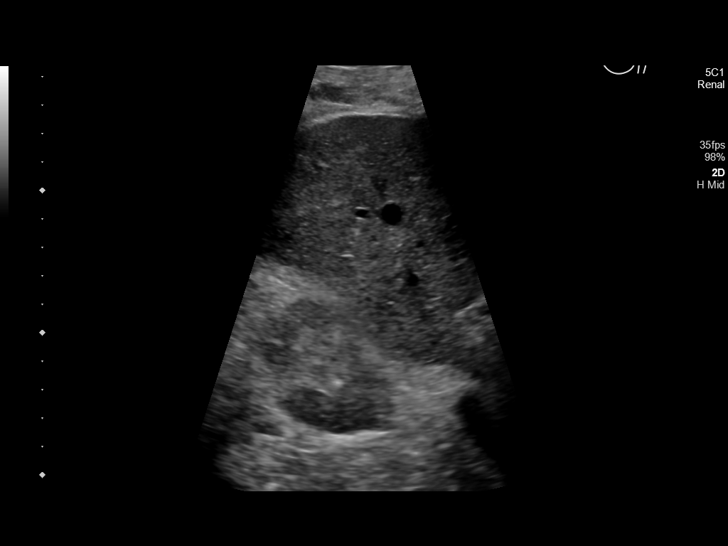
[im 19/50]
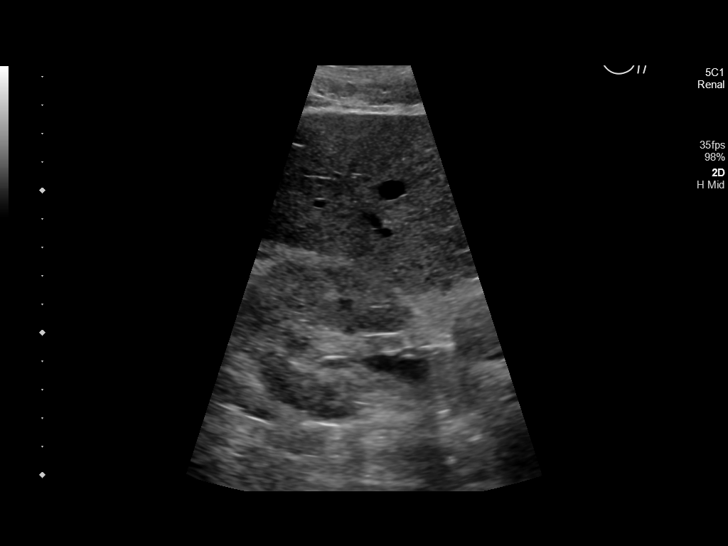
[im 23/50]
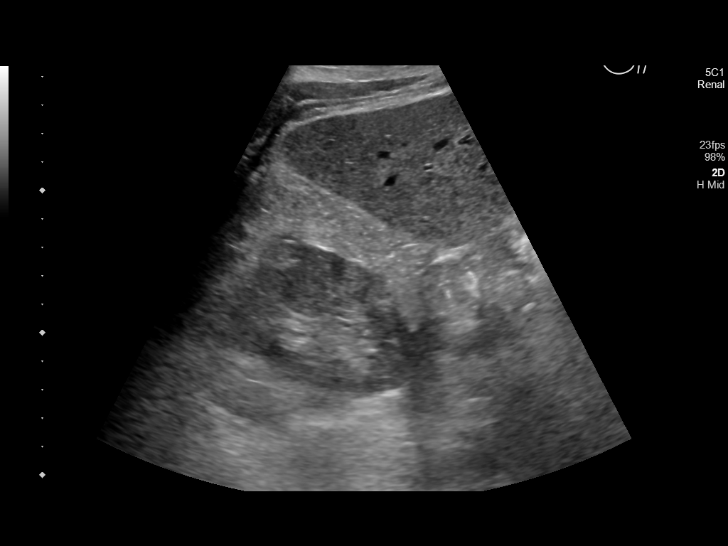
[im 27/50]
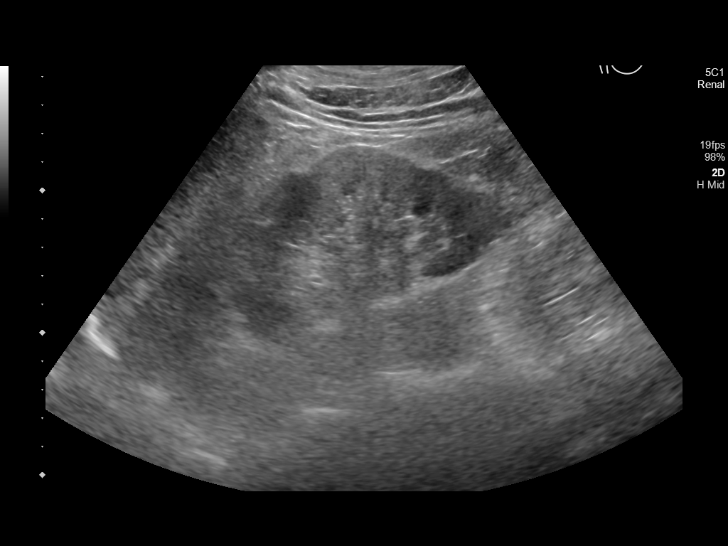
[im 31/50]
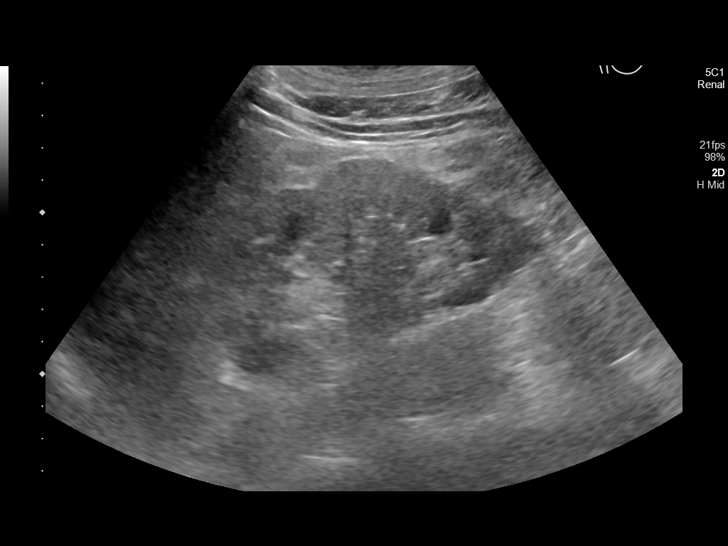
[im 33/50]
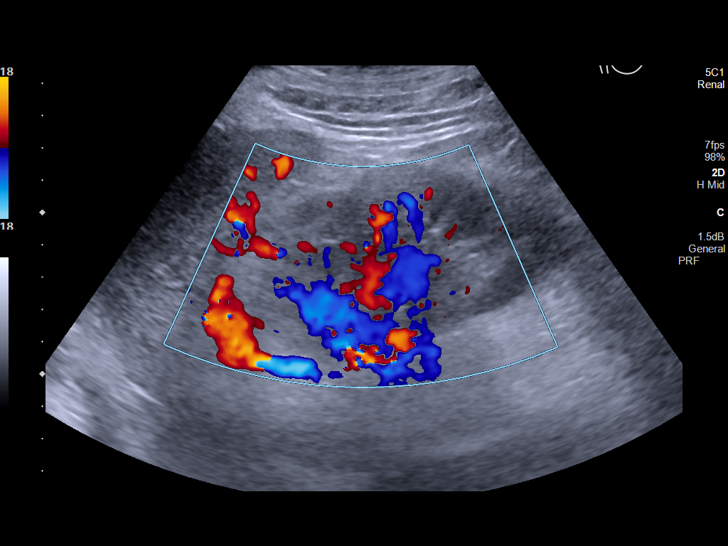
[im 37/50]
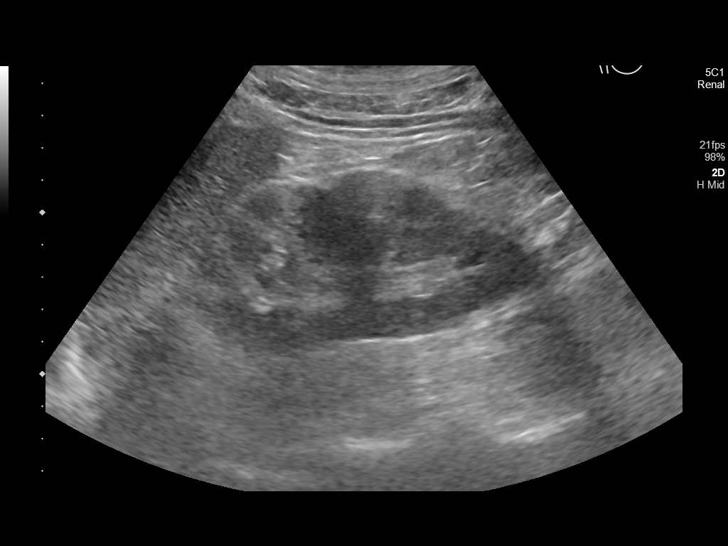
[im 41/50]
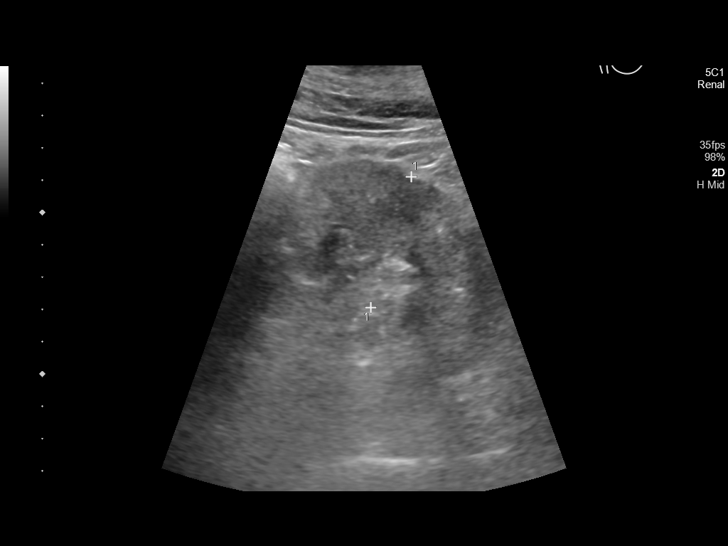
[im 45/50]
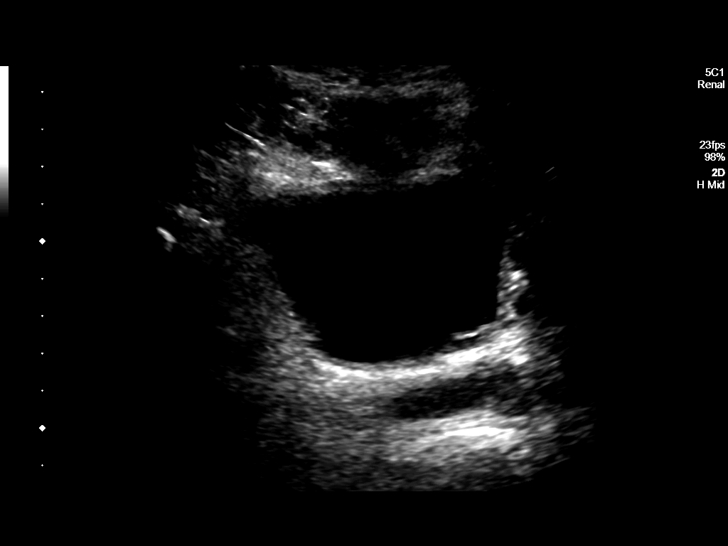
[im 50/50]
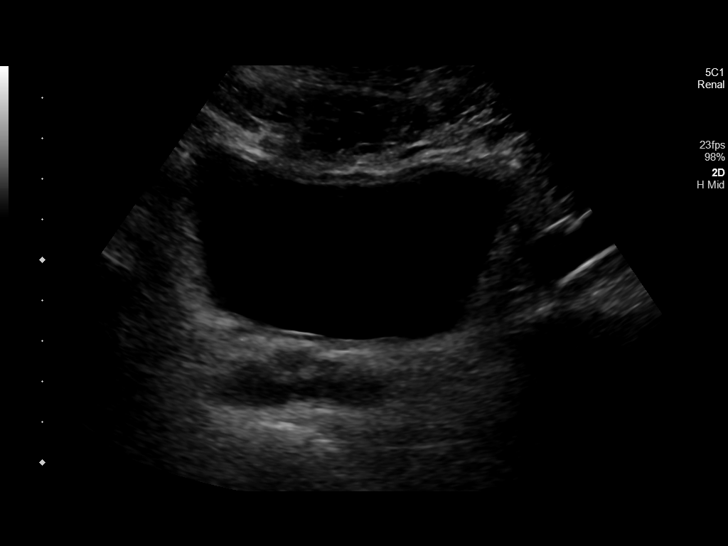

[14 of 25 positions shown; findings below may reference images not displayed]

FINDINGS: Right Kidney:

Renal measurements: 9.3 x 5.6 x 5.6 cm = volume: 153.3 mL. Increased
parenchymal echogenicity. No mass, stone or hydronephrosis.

Left Kidney:

Renal measurements: 10.1 x 5.7 x 4.2 cm = volume: 121.2 mL.
Increased parenchymal echogenicity. No mass, stone or
hydronephrosis.

Bladder:

Appears normal for degree of bladder distention.

Other:

None.
IMPRESSION: 1. No acute findings.  No hydronephrosis.
2. Bilateral increased renal parenchymal echogenicity consistent
with medical renal disease.

## 2021-11-26 MED ORDER — CARVEDILOL 6.25 MG PO TABS
6.2500 mg | ORAL_TABLET | Freq: Two times a day (BID) | ORAL | Status: DC
  Administered 2021-11-26 – 2021-11-27 (×2): 6.25 mg via ORAL
  Filled 2021-11-26 (×2): qty 1

## 2021-11-26 MED ORDER — HYDROXYZINE HCL 25 MG PO TABS: 50.0000 mg | ORAL_TABLET | Freq: Three times a day (TID) | ORAL | Status: DC | PRN

## 2021-11-26 MED ORDER — ONDANSETRON HCL 4 MG/2ML IJ SOLN: 4.0000 mg | Freq: Four times a day (QID) | INTRAMUSCULAR | Status: DC | PRN

## 2021-11-26 MED ORDER — TRAMADOL HCL 50 MG PO TABS
50.0000 mg | ORAL_TABLET | Freq: Three times a day (TID) | ORAL | Status: DC | PRN
  Administered 2021-11-26 (×2): 50 mg via ORAL
  Filled 2021-11-26 (×2): qty 1

## 2021-11-26 MED ORDER — PANTOPRAZOLE SODIUM 40 MG PO TBEC
40.0000 mg | DELAYED_RELEASE_TABLET | Freq: Two times a day (BID) | ORAL | Status: DC
  Administered 2021-11-26 – 2021-11-28 (×5): 40 mg via ORAL
  Filled 2021-11-26 (×5): qty 1

## 2021-11-26 MED ORDER — ACETAMINOPHEN 650 MG RE SUPP
650.0000 mg | RECTAL | Status: DC | PRN
  Filled 2021-11-26: qty 1

## 2021-11-26 MED ORDER — DULOXETINE HCL 30 MG PO CPEP
60.0000 mg | ORAL_CAPSULE | Freq: Two times a day (BID) | ORAL | Status: DC
  Administered 2021-11-26 – 2021-11-28 (×5): 60 mg via ORAL
  Filled 2021-11-26: qty 1
  Filled 2021-11-26: qty 2
  Filled 2021-11-26: qty 1
  Filled 2021-11-26 (×2): qty 2

## 2021-11-26 MED ORDER — FENOFIBRATE 54 MG PO TABS
54.0000 mg | ORAL_TABLET | Freq: Every day | ORAL | Status: DC
  Administered 2021-11-26 – 2021-11-28 (×3): 54 mg via ORAL
  Filled 2021-11-26 (×3): qty 1

## 2021-11-26 MED ORDER — ENOXAPARIN SODIUM 30 MG/0.3ML IJ SOSY
30.0000 mg | PREFILLED_SYRINGE | Freq: Every day | INTRAMUSCULAR | Status: DC
  Administered 2021-11-26 – 2021-11-27 (×2): 30 mg via SUBCUTANEOUS
  Filled 2021-11-26 (×2): qty 0.3

## 2021-11-26 MED ORDER — FUROSEMIDE 10 MG/ML IJ SOLN
40.0000 mg | Freq: Once | INTRAMUSCULAR | Status: AC
  Administered 2021-11-26: 11:00:00 40 mg via INTRAVENOUS
  Filled 2021-11-26: qty 4

## 2021-11-26 MED ORDER — INSULIN ASPART 100 UNIT/ML IJ SOLN: 0.0000 [IU] | Freq: Three times a day (TID) | INTRAMUSCULAR | Status: DC

## 2021-11-26 MED ORDER — FUROSEMIDE 10 MG/ML IJ SOLN
40.0000 mg | Freq: Two times a day (BID) | INTRAMUSCULAR | Status: DC
Start: 2021-11-26 — End: 2021-11-26

## 2021-11-26 MED ORDER — HYDRALAZINE HCL 25 MG PO TABS
25.0000 mg | ORAL_TABLET | Freq: Three times a day (TID) | ORAL | Status: DC
  Administered 2021-11-26 – 2021-11-27 (×4): 25 mg via ORAL
  Filled 2021-11-26 (×4): qty 1

## 2021-11-26 MED ORDER — ACETAMINOPHEN 325 MG PO TABS
650.0000 mg | ORAL_TABLET | Freq: Four times a day (QID) | ORAL | Status: DC | PRN
  Administered 2021-11-26: 09:00:00 650 mg via ORAL
  Filled 2021-11-26: qty 2

## 2021-11-26 MED ORDER — INSULIN ASPART 100 UNIT/ML IJ SOLN: 0.0000 [IU] | Freq: Every day | INTRAMUSCULAR | Status: DC

## 2021-11-26 MED ORDER — NITROGLYCERIN 2 % TD OINT
1.0000 [in_us] | TOPICAL_OINTMENT | Freq: Three times a day (TID) | TRANSDERMAL | Status: DC
  Administered 2021-11-26 – 2021-11-27 (×4): 1 [in_us] via TOPICAL
  Filled 2021-11-26 (×4): qty 1

## 2021-11-26 MED ORDER — AMLODIPINE BESYLATE 10 MG PO TABS
10.0000 mg | ORAL_TABLET | Freq: Every day | ORAL | Status: DC
  Administered 2021-11-26 – 2021-11-28 (×3): 10 mg via ORAL
  Filled 2021-11-26: qty 1
  Filled 2021-11-26: qty 2
  Filled 2021-11-26: qty 1

## 2021-11-26 MED ORDER — TRAZODONE HCL 100 MG PO TABS
100.0000 mg | ORAL_TABLET | Freq: Every day | ORAL | Status: DC
  Administered 2021-11-26 – 2021-11-27 (×2): 100 mg via ORAL
  Filled 2021-11-26 (×2): qty 1

## 2021-11-26 MED ORDER — HYDRALAZINE HCL 20 MG/ML IJ SOLN
10.0000 mg | Freq: Four times a day (QID) | INTRAMUSCULAR | Status: DC | PRN
  Filled 2021-11-26: qty 1

## 2021-11-26 MED ORDER — ENOXAPARIN SODIUM 40 MG/0.4ML IJ SOSY: 40.0000 mg | PREFILLED_SYRINGE | Freq: Every day | INTRAMUSCULAR | Status: DC

## 2021-11-26 MED ORDER — LISINOPRIL 10 MG PO TABS: 20.0000 mg | ORAL_TABLET | Freq: Every day | ORAL | Status: DC

## 2021-11-26 MED ORDER — TOPIRAMATE 25 MG PO TABS
50.0000 mg | ORAL_TABLET | Freq: Two times a day (BID) | ORAL | Status: DC
  Administered 2021-11-26 – 2021-11-28 (×5): 50 mg via ORAL
  Filled 2021-11-26 (×6): qty 2

## 2021-11-26 NOTE — ED Notes (Signed)
Urine sent to lab 

## 2021-11-26 NOTE — ED Notes (Signed)
Notified MD pt had moderate headache and requested pain medication. Informed that pt was doing well on r/a and most recent BP systolic was 567. Pt still on Nitro drip at 15 mcg/min from titration overnight. Per MD continue to titrate down to switch to oral medications.

## 2021-11-26 NOTE — Progress Notes (Signed)
[facility] [place] NAME: Derek Cooley    MR#:  [id number]  DATE OF BIRTH:  1966-01-16  SUBJECTIVE:  patient came in with shortness of breath placed on BiPAP earlier now off of it. Has been noncompliant with meds. No family at bedside. Blood pressure remains high. Trying to wean Nitro down with adding oral BP meds.  REVIEW OF SYSTEMS:   Review of Systems  Constitutional:  Negative for chills, fever and weight loss.  HENT:  Negative for ear discharge, ear pain and nosebleeds.   Eyes:  Negative for blurred vision, pain and discharge.  Respiratory:  Positive for shortness of breath. Negative for sputum production, wheezing and stridor.   Cardiovascular:  Positive for chest pain. Negative for palpitations, orthopnea and PND.  Gastrointestinal:  Negative for abdominal pain, diarrhea, nausea and vomiting.  Genitourinary:  Negative for frequency and urgency.  Musculoskeletal:  Negative for back pain and joint pain.  Neurological:  Positive for weakness and headaches. Negative for sensory change, speech change and focal weakness.  Psychiatric/Behavioral:  Negative for depression and hallucinations. The patient is not nervous/anxious.   Tolerating Diet:yes Tolerating PT:   DRUG ALLERGIES:   Allergies  Allergen Reactions   Atorvastatin     Joint Aches - Severe Joint Aches - Severe Joint Aches - Severe   Avelox [Moxifloxacin Hcl In Nacl]     Muscle pain   Buprenorphine     Mouth sores, confusion, shaking   Dilaudid [Hydromorphone Hcl] Hives   Fluoxetine Itching   Levofloxacin Other (See Comments)    Joint Pain   Morphine And Related    Other     Muscle pain   Suboxone [Buprenorphine Hcl-Naloxone Hcl] Other (See Comments)    Rash and confused   Vancomycin     Renal insufficiency    VITALS:  Blood pressure (!) 183/136, pulse 92, temperature 98.6 F (37 C), temperature source Oral, resp. rate 18, height 5\' 7"  (1.702 m), weight 98.4  kg, SpO2 96 %.  PHYSICAL EXAMINATION:   Physical Exam  GENERAL:  55 y.o.-year-old patient lying in the bed with no acute distress.  HEENT: Head atraumatic, normocephalic. Oropharynx and nasopharynx clear.  LUNGS: Normal breath sounds bilaterally, no wheezing, rales, rhonchi. No use of accessory muscles of respiration.  CARDIOVASCULAR: S1, S2 normal. No murmurs, rubs, or gallops.  ABDOMEN: Soft, nontender, nondistended. Bowel sounds present. No organomegaly or mass.  EXTREMITIES: No cyanosis, clubbing or edema b/l.    NEUROLOGIC: nonfocal PSYCHIATRIC:  patient is alert and oriented x 3.  SKIN: No obvious rash, lesion, or ulcer.   LABORATORY PANEL:  CBC Recent Labs  Lab 11/25/21 1117  WBC 11.1*  HGB 10.4*  HCT 31.6*  PLT 255    Chemistries  Recent Labs  Lab 11/25/21 1117 11/26/21 1128  NA 140 141  K 3.9 4.0  CL 110 107  CO2 24 26  GLUCOSE 116* 155*  BUN 41* 42*  CREATININE 3.45* 3.84*  CALCIUM 8.4* 8.5*  AST 42*  --   ALT 32  --   ALKPHOS 99  --   BILITOT 0.6  --    Cardiac Enzymes No results for input(s): TROPONINI in the last 168 hours. RADIOLOGY:  DG Chest 2 View  Result Date: 11/25/2021 CLINICAL DATA:  Right-sided chest wall pain. Short of breath. Patient reports that he fell down a hill a couple of days ago landing on his right side. EXAM: CHEST - 2 VIEW COMPARISON:  06/29/2020 FINDINGS: Cardiac silhouette normal in size. No mediastinal or hilar masses or evidence of adenopathy. There is interstitial thickening most evident in the bases, including thickening along the oblique fissures. No lung consolidation. Trace bilateral pleural effusions. No pneumothorax. Skeletal structures are intact. IMPRESSION: 1. Interstitial thickening with trace pleural effusions. Suspect mild interstitial edema. 2. No visualized rib fracture. Electronically Signed   By: Lajean Manes M.D.   On: 11/25/2021 12:05   CT HEAD WO CONTRAST (5MM)  Result Date: 11/25/2021 CLINICAL DATA:   Subarachnoid hemorrhage [facility]) EXAM: CT HEAD WITHOUT CONTRAST TECHNIQUE: Contiguous axial images were obtained from the base of the skull through the vertex without intravenous contrast. 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 Vascular: No hyperdense vessel or unexpected calcification. Skull: Prior right frontotemporal craniotomy with plate fixation hardware and associated artifact. No acute fracture. Sinuses/Orbits: Opacification in frothy material within the maxillary sinuses. No acute findings in the orbits. Other: None. IMPRESSION: No acute intracranial abnormality. 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 Electronically Signed   By: Maurine Simmering M.D.   On: 11/25/2021 13:58   US RENAL  Result Date: 11/26/2021 CLINICAL DATA:  Acute kidney injury. EXAM: RENAL / URINARY TRACT ULTRASOUND COMPLETE COMPARISON:  CT, 11/25/2021. FINDINGS: Right Kidney: Renal measurements: 9.3 x 5.6 x 5.6 cm = volume: 153.3 mL. Increased parenchymal echogenicity. No mass, stone or hydronephrosis. Left Kidney: Renal measurements: 10.1 x 5.7 x 4.2 cm = volume: 121.2 mL. Increased parenchymal echogenicity. No mass, stone or hydronephrosis. Bladder: Appears normal for degree of bladder distention. Other: None. IMPRESSION: 1. No acute findings.  No hydronephrosis. 2. Bilateral increased renal parenchymal echogenicity consistent with medical renal disease. Electronically Signed   By: Lajean Manes M.D.   On: 11/26/2021 10:07   CT CHEST ABDOMEN PELVIS WO CONTRAST  Result Date: 11/25/2021 CLINICAL DATA:  Pt comes into the ED via POV c/ob [facility] that started several  days ago. Pt states he fell down a hill a couple days ago and landed on the right side of his ribs. Pt also has h/o HTN and is noncompliant with meds. Pt has even and unlabored respirations at this time. EXAM: CT CHEST, ABDOMEN AND PELVIS WITHOUT CONTRAST TECHNIQUE: Multidetector CT imaging of the chest, abdomen and pelvis was performed following the standard protocol without IV contrast. COMPARISON:  07/24/2015 FINDINGS: CT CHEST FINDINGS Cardiovascular: Heart normal in size. No pericardial effusion. 3 vessel coronary artery calcifications. Great vessels are normal in caliber. Mediastinum/Nodes: No neck base, mediastinal or hilar masses or enlarged lymph nodes. Trachea and esophagus are unremarkable. Lungs/Pleura: Bilateral interstitial thickening, most prominent in the bases. There is additional mild dependent lung base opacity in the lower lobes consistent with atelectasis. No lung consolidation to suggest pneumonia. Trace pleural effusions. No pneumothorax. Musculoskeletal: Subtle nondisplaced fractures of the anterior right fourth, fifth and sixth ribs. No other acute fractures. There are additional old healed right rib fractures. No bone lesions. CT ABDOMEN PELVIS FINDINGS Hepatobiliary: No focal liver abnormality is seen. No gallstones, gallbladder wall thickening, or biliary dilatation. Pancreas: Unremarkable. No pancreatic ductal dilatation or surrounding inflammatory changes. Spleen: Normal in size without focal abnormality. Adrenals/Urinary Tract: No adrenal masses. Kidneys normal in  size, orientation and position. No renal masses or stones. No hydronephrosis. Mild bilateral perinephric stranding, presumed chronic. Ureters are normal in course and in caliber. Bladder is unremarkable. Stomach/Bowel: Normal stomach. Bowel anastomosis staple line noted along the mid sigmoid colon. Small bowel and colon are normal in caliber. No wall thickening. No inflammation. Normal appendix visualized. Vascular/Lymphatic:  Aortic atherosclerosis. No aneurysm. No enlarged lymph nodes. Reproductive: Unremarkable. Other: Anterior abdominal wall midline incision. No hernia. No ascites. Musculoskeletal: No fracture or acute finding.  No bone lesion. IMPRESSION: 1. Subtle nondisplaced fractures of the anterior right fourth, fifth and sixth ribs, which appear acute. 2. Lungs with bilateral interstitial thickening and trace pleural effusions, findings consistent with interstitial pulmonary edema. 3. No other acute abnormality within the chest, abdomen or pelvis. 4. Aortic atherosclerosis. Electronically Signed   By: Lajean Manes M.D.   On: 11/25/2021 14:00   ASSESSMENT AND PLAN:  Mr. Lack is a 55 yo man with a h/o chronic renal insufficiency, HTN, remote subarachnoid hemorrhage, s/p clipping, who was admitted with worsening SOB. Patient has been noncompliant with his medications. He uses cocaine and amphetamines. Came in with malignant hypertension was started on IV Nitro drip.  Admits to using cocaine prior to coming to the hospital due to "panic attack  Due to respiratory extremis, the patient was placed on BiPAP therapy.  He was also extremely hypertensive with initial systolic pressure of 193 and a diastolic of 790.  He was placed on nitroglycerin drip. Patient currently is off BiPAP drip.   CT chest demonstrated some subtle nondisplaced right fourth fifth sixth rib fractures.  There is some interstitial pulmonary edema.   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   Chest x-ray demonstrated interstitial edema. EKG showed normal sinus rhythm.  Malignant hypertension medication noncompliance -- patient came in with significantly elevated blood pressure. -- Started on IV Nitro drip which has been weaned off -- patient started now on amlodipine, and hydralazine and Coreg -- will also consider Nitro paste -- PRN pain meds for headache from Nitro -- patient advised  medication compliance  flash pulmonary edema with CHF in the setting of malignant hypertension elevated troponin appears demand ischemia -- Echo pending -- patient had been on BiPAP now on nasal cannula oxyge-- wean to room air is able to -- patient seen by Dr. Lovena Le [facility] appreciate input -- BNP 549  nondisplaced right fourth fifth sixth rib fracture -- PRN pain meds  acute on chronic kidney disease stage 3a -- creatinine 1.95 -- came in with creatinine of 3.5 -- nephrology consultation with Dr. 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  Polysubstance drug abuse tobacco abuse -- urine drug screen positive for amphetamine and cocaine -- advised cessation   Procedures: Family communication : none Consults : [facility] cardiology, nephrology CODE STATUS: full DVT Prophylaxis : heparin Level of care: Progressive Status is: Inpatient  Remains inpatient appropriate because: malignant hypertension, CHF, CKD        TOTAL TIME TAKING CARE OF THIS PATIENT: 25 minutes.  >50% time spent on counselling and coordination of care  Note: This dictation was prepared with Dragon dictation along with smaller phrase technology. Any transcriptional errors that result from this process are unintentional.  Fritzi Mandes M.D    [facility]  CC: Primary care physician; Jon Billings, NP Patient ID: Derek Cooley., male   DOB: March 10, 1966, 55 y.o.   MRN: [id number]

## 2021-11-26 NOTE — Progress Notes (Signed)
[place] standby. Patient resting comfortably

## 2021-11-26 NOTE — ED Notes (Addendum)
Patient is c/o moderate headache. Will message MD for PRN medication.

## 2021-11-26 NOTE — Consult Note (Signed)
[facility]  CONSULT NOTE    Date: 11/26/2021                  Patient Name:  Derek Cooley.  MRN: [id number]  DOB: 22-Oct-1966  Age / Sex: 55 y.o., male         PCP: Jon Billings, NP                 Service Requesting Consult: Maribel                 Reason for Consult: Acute kidney injury            History of Present Illness: [name] is a 55 y.o.  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 Patient states he has not been compliant with his blood pressure medications since being kicked out of his primary home more than a year ago.  He currently resides with a cousin in [place].  Patient has a primary care physician but has not seen them in quite a while.  Denies knowledge of kidney dysfunction previously.  Admits to intermittent use of NSAIDs.  Denies nausea, vomiting, and diarrhea.  Denies shortness of breath and chest pain.  Creatinine on admission is 3.45 with GFR 20.  Baseline appears to be 1.95 with GFR 40 on Apr 17, 2021.  Urinalysis shows greater than 300 protein.  Positive urine drug screen for amphetamines and cocaine.  Chest x-ray shows mild interstitial edema.  CT head negative.  CT abdomen chest show subtle nondisplaced fractures of right fourth fifth and sixth anterior ribs.  Renal ultrasound negative for hydronephrosis.   Medications: Outpatient medications: (Not in a hospital admission)   Current medications: Current Facility-Administered Medications  Medication Dose Route Frequency Provider Last Rate Last Admin   acetaminophen (TYLENOL) tablet 650 mg  650 mg Oral Q6H PRN Fritzi Mandes, MD   650 mg at 11/26/21 0841   amLODipine (NORVASC) tablet 10 mg  10 mg Oral Daily Fritzi Mandes, MD   10 mg at 11/26/21 1056    carvedilol (COREG) tablet 6.25 mg  6.25 mg Oral BID WC Lateef, Munsoor, MD       DULoxetine (CYMBALTA) DR capsule 60 mg  60 mg Oral BID Fritzi Mandes, MD   60 mg at 11/26/21 1055   fenofibrate tablet 54 mg  54 mg Oral Daily Fritzi Mandes, MD   54 mg at 11/26/21 1055   hydrALAZINE (APRESOLINE) tablet 25 mg  25 mg Oral Q8H Fritzi Mandes, MD   25 mg at 11/26/21 1055   hydrOXYzine (ATARAX) tablet 50 mg  50 mg Oral TID PRN Fritzi Mandes, MD       insulin aspart (novoLOG) injection 0-5 Units  0-5 Units Subcutaneous QHS Fritzi Mandes, MD       insulin aspart (novoLOG) injection 0-9 Units  0-9 Units Subcutaneous TID WC Fritzi Mandes, MD       lidocaine (LIDODERM) 5 % 1 patch  1 patch Transdermal Q24H Vanessa Ripley, MD   1 patch at 11/25/21 1219   nitroGLYCERIN 50 mg in dextrose 5 % 250 mL (0.2 mg/mL) infusion  0-200 mcg/min Intravenous Continuous Vanessa Pine Level, MD 3 mL/hr at 11/26/21 0843 10 mcg/min at 11/26/21 0843   pantoprazole (PROTONIX) EC tablet  40 mg  40 mg Oral BID AC Fritzi Mandes, MD   40 mg at 11/26/21 1056   topiramate (TOPAMAX) tablet 50 mg  50 mg Oral BID Fritzi Mandes, MD   50 mg at 11/26/21 1056   traZODone (DESYREL) tablet 100 mg  100 mg Oral QHS Fritzi Mandes, MD       Current Outpatient Medications  Medication Sig Dispense Refill   amLODipine (NORVASC) 10 MG tablet Take 1 tablet (10 mg total) by mouth daily. 90 tablet 0   carvedilol (COREG) 12.5 MG tablet Take 1 tablet (12.5 mg total) by mouth 2 (two) times daily with a meal. 180 tablet 0   DULoxetine (CYMBALTA) 60 MG capsule Take 1 capsule (60 mg total) by mouth 2 (two) times daily. 180 capsule 0   fenofibrate micronized (LOFIBRA) 67 MG capsule TAKE 1 CAPSULE EVERY DAY BEFORE BREAKFAST 90 capsule 0   furosemide (LASIX) 20 MG tablet Take 1 tablet (20 mg total) by mouth daily. 30 tablet 0   glipiZIDE (GLUCOTROL XL) 5 MG 24 hr tablet TAKE ONE TABLET EACH MORNING WITH BREAKFAST 90 tablet 0   glucose blood (ONETOUCH ULTRA) test strip USE TO TEST BLOOD  SUGAR DAILY 100 each 3   lisinopril (ZESTRIL) 20 MG tablet Take 1 tablet (20 mg total) by mouth daily. 90 tablet 0   methocarbamol (ROBAXIN) 750 MG tablet Take 1,500 mg by mouth 4 (four) times daily.     pantoprazole (PROTONIX) 40 MG tablet Take 1 tablet (40 mg total) by mouth 2 (two) times daily as needed. 60 tablet 2   pregabalin (LYRICA) 50 MG capsule Take 50 mg by mouth 4 (four) times daily.     topiramate (TOPAMAX) 50 MG tablet 50 mg 2 (two) times daily.      traZODone (DESYREL) 100 MG tablet Take 100 mg by mouth at bedtime.     albuterol (PROVENTIL) (2.5 MG/3ML) 0.083% nebulizer solution Take 3 mLs (2.5 mg total) by nebulization every 6 (six) hours as needed for wheezing or shortness of breath. 150 mL 1   albuterol (VENTOLIN HFA) 108 (90 Base) MCG/ACT inhaler Inhale 1-2 puffs into the lungs every 6 (six) hours as needed for wheezing or shortness of breath. 18 g 3   hydrOXYzine (ATARAX/VISTARIL) 50 MG tablet TAKE ONE TABLET 3 TIMES DAILY AS NEEDED 90 tablet 1   ibuprofen (ADVIL) 800 MG tablet Take 1 tablet (800 mg total) by mouth every 8 (eight) hours as needed. 30 tablet 0      Allergies: Allergies  Allergen Reactions   Atorvastatin     Joint Aches - Severe Joint Aches - Severe Joint Aches - Severe   Avelox [Moxifloxacin Hcl In Nacl]     Muscle pain   Buprenorphine     Mouth sores, confusion, shaking   Dilaudid [Hydromorphone Hcl] Hives   Fluoxetine Itching   Levofloxacin Other (See Comments)    Joint Pain   Morphine And Related    Other     Muscle pain   Suboxone [Buprenorphine Hcl-Naloxone Hcl] Other (See Comments)    Rash and confused   Vancomycin     Renal insufficiency      Past Medical History: Past Medical History:  Diagnosis Date   Abdominal abscess    Acute pancreatitis    Anxiety    Asthma    Back pain    Depression    Diabetes mellitus without complication ([place])    Emphysema of lung ([place])    right sided  GERD (gastroesophageal reflux disease)     Hemorrhage into subarachnoid space of neuraxis ([place]) 01/26/2010   Hypertension    Mild cognitive impairment    MRSA (methicillin resistant Staphylococcus aureus)    Obesity    Panic disorder    Perforated bowel ([place]) 2007   Tempoary Colostomy Bag, Skin Graft for Abd wound   Sleep apnea    sleep study 2013   Subarachnoid hemorrhage ([place]) 2011   coil placed   Type 2 diabetes mellitus without complication, without long-term current use of insulin ([place]) 04/17/2016   Vitreous hemorrhage ([place]) 03/27/2011   Overview:  Bilateral; 01/2010, from [facility]      Past Surgical History: Past Surgical History:  Procedure Laterality Date   COLON SURGERY  2007   colostomy bag placed s/p perforated bowel   COLONOSCOPY WITH PROPOFOL N/A 05/18/2020   Procedure: COLONOSCOPY WITH PROPOFOL;  Surgeon: Lin Landsman, MD;  Location: [facility] ENDOSCOPY;  Service: Gastroenterology;  Laterality: N/A;   KNEE SURGERY Right    Perforated bowel       Family History: Family History  Problem Relation Age of Onset   Arthritis Mother    Asthma Mother    Diabetes Mother    Heart disease Mother    Hyperlipidemia Mother    Hypertension Mother    Kidney disease Mother    Thyroid disease Mother    Lung disease Mother    Anxiety disorder Mother    Depression Mother    Diabetes Father    Heart disease Father    Depression Father    Anxiety disorder Father    Arthritis Sister    Asthma Sister    Hyperlipidemia Sister    Hypertension Sister    Lung disease Sister    Anxiety disorder Sister    Depression Sister    Hyperlipidemia Brother    Hypertension Brother    Diabetes Sister    Heart disease Sister    Depression Sister    Anxiety disorder Sister    Anxiety disorder Brother    Depression Brother    Heart disease Brother      Social History: Social History   Socioeconomic History   Marital status: Divorced    Spouse name: Not on file   Number of children: Not on file   Years of education: Not on  file   Highest education level: Not on file  Occupational History   Not on file  Tobacco Use   Smoking status: Every Day    Packs/day: 2.00    Types: Cigarettes    Start date: 10/11/1984   Smokeless tobacco: Never  Vaping Use   Vaping Use: Never used  Substance and Sexual Activity   Alcohol use: Yes    Alcohol/week: 0.0 standard drinks    Comment: occasionall    Drug use: Yes    Types: "Crack" cocaine, Amphetamines   Sexual activity: Not Currently  Other Topics Concern   Not on file  Social History Narrative   Not on file   Social Determinants of Health   Financial Resource Strain: Not on file  Food Insecurity: Not on file  Transportation Needs: Not on file  Physical Activity: Not on file  Stress: Not on file  Social Connections: Not on file  Intimate Partner Violence: Not on file     Review of Systems: Review of Systems  Constitutional:  Negative for chills, fever and malaise/fatigue.  HENT:  Negative for congestion, sore throat and tinnitus.   Eyes:  Negative for redness.  Respiratory:  Positive for shortness of breath. Negative for cough and wheezing.   Cardiovascular:  Negative for chest pain, palpitations, claudication and leg swelling.  Gastrointestinal:  Negative for abdominal pain, blood in stool, diarrhea, nausea and vomiting.  Genitourinary:  Negative for flank pain, frequency and hematuria.  Musculoskeletal:  Positive for falls. Negative for back pain and myalgias.  Skin:  Negative for rash.  Neurological:  Negative for dizziness, weakness and headaches.  Endo/Heme/Allergies:  Does not bruise/bleed easily.  Psychiatric/Behavioral:  Negative for depression. The patient is not nervous/anxious and does not have insomnia.    Vital Signs: Blood pressure (!) 188/103, pulse 85, temperature 98.6 F (37 C), temperature source Oral, resp. rate 20, height 5\' 7"  (1.702 m), weight 98.4 kg, SpO2 97 %.  Weight trends: Filed Weights   11/25/21 1109  Weight: 98.4 kg     Physical Exam: General: NAD, sitting up in bed  Head: Normocephalic, atraumatic. Moist oral mucosal membranes  Eyes: Anicteric  Lungs:  Minimal wheeze, normal effort, O2 4 L  Heart: Regular rate and rhythm  Abdomen:  Soft, nontender, nondistended  Extremities: No peripheral edema.  Neurologic: Alert, oriented moving all four extremities  Skin: No lesions        Lab results: Basic Metabolic Panel: Recent Labs  Lab 11/25/21 1117  NA 140  K 3.9  CL 110  CO2 24  GLUCOSE 116*  BUN 41*  CREATININE 3.45*  CALCIUM 8.4*    Liver Function Tests: Recent Labs  Lab 11/25/21 1117  AST 42*  ALT 32  ALKPHOS 99  BILITOT 0.6  PROT 6.9  ALBUMIN 3.3*   Recent Labs  Lab 11/25/21 1117  LIPASE 37   No results for input(s): AMMONIA in the last 168 hours.  CBC: Recent Labs  Lab 11/25/21 1117  WBC 11.1*  HGB 10.4*  HCT 31.6*  MCV 93.8  PLT 255    Cardiac Enzymes: No results for input(s): CKTOTAL, CKMB, CKMBINDEX, TROPONINI in the last 168 hours.  BNP: Invalid input(s): POCBNP  CBG: No results for input(s): GLUCAP in the last 168 hours.  Microbiology: Results for orders placed or performed during the hospital encounter of 11/25/21  Resp Panel by RT-PCR (Flu A&B, Covid) Nasopharyngeal Swab     Status: None   Collection Time: 11/25/21 11:42 AM   Specimen: Nasopharyngeal Swab; Nasopharyngeal(NP) swabs in vial transport medium  Result Value Ref Range Status   SARS Coronavirus 2 by RT PCR NEGATIVE NEGATIVE Final    Comment: (NOTE) SARS-CoV-2 target nucleic acids are NOT DETECTED.  The SARS-CoV-2 RNA is generally detectable in upper respiratory specimens during the acute phase of infection. The lowest concentration of SARS-CoV-2 viral copies this assay can detect is 138 copies/mL. A negative result does not preclude SARS-Cov-2 infection and should not be used as the sole basis for treatment or other patient management decisions. A negative result may occur with   improper specimen collection/handling, submission of specimen other than nasopharyngeal swab, presence of viral mutation(s) within the areas targeted by this assay, and inadequate number of viral copies(<138 copies/mL). A negative result must be combined with clinical observations, patient history, and epidemiological information. The expected result is Negative.  Fact Sheet for Patients:  EntrepreneurPulse.com.au  Fact Sheet for Healthcare Providers:  IncredibleEmployment.be  This test is no t yet approved or cleared by the [place] FDA and  has been authorized for detection and/or diagnosis of SARS-CoV-2 by FDA under an Emergency Use Authorization (EUA).  This EUA will remain  in effect (meaning this test can be used) for the duration of the COVID-19 declaration under Section 564(b)(1) of the Act, 21 U.S.C.section 360bbb-3(b)(1), unless the authorization is terminated  or revoked sooner.       Influenza A by PCR NEGATIVE NEGATIVE Final   Influenza B by PCR NEGATIVE NEGATIVE Final    Comment: (NOTE) The Xpert Xpress SARS-CoV-2/FLU/RSV plus assay is intended as an aid in the diagnosis of influenza from Nasopharyngeal swab specimens and should not be used as a sole basis for treatment. Nasal washings and aspirates are unacceptable for Xpert Xpress SARS-CoV-2/FLU/RSV testing.  Fact Sheet for Patients: EntrepreneurPulse.com.au  Fact Sheet for Healthcare Providers: IncredibleEmployment.be  This test is not yet approved or cleared by the [place] FDA and has been authorized for detection and/or diagnosis of SARS-CoV-2 by FDA under an Emergency Use Authorization (EUA). This EUA will remain in effect (meaning this test can be used) for the duration of the COVID-19 declaration under Section 564(b)(1) of the Act, 21 U.S.C. section 360bbb-3(b)(1), unless the authorization is terminated  or revoked.  Performed at [facility], [address] [place], [place] [address]     Coagulation Studies: No results for input(s): LABPROT, INR in the last 72 hours.  Urinalysis: Recent Labs    11/25/21 1210  COLORURINE YELLOW*  LABSPEC 1.015  PHURINE 6.0  GLUCOSEU NEGATIVE  HGBUR SMALL*  BILIRUBINUR NEGATIVE  KETONESUR NEGATIVE  PROTEINUR >=300*  NITRITE NEGATIVE  LEUKOCYTESUR NEGATIVE      Imaging: DG Chest 2 View  Result Date: 11/25/2021 CLINICAL DATA:  Right-sided chest wall pain. Short of breath. Patient reports that he fell down a hill a couple of days ago landing on his right side. EXAM: CHEST - 2 VIEW COMPARISON:  06/29/2020 FINDINGS: Cardiac silhouette normal in size. No mediastinal or hilar masses or evidence of adenopathy. There is interstitial thickening most evident in the bases, including thickening along the oblique fissures. No lung consolidation. Trace bilateral pleural effusions. No pneumothorax. Skeletal structures are intact. IMPRESSION: 1. Interstitial thickening with trace pleural effusions. Suspect mild interstitial edema. 2. No visualized rib fracture. Electronically Signed   By: Lajean Manes M.D.   On: 11/25/2021 12:05   CT HEAD WO CONTRAST (5MM)  Result Date: 11/25/2021 CLINICAL DATA:  Subarachnoid hemorrhage [facility]) EXAM: CT HEAD WITHOUT CONTRAST TECHNIQUE: Contiguous axial images were obtained from the base of the skull through the vertex without intravenous contrast. 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 Vascular: No hyperdense vessel or unexpected calcification. Skull: Prior right frontotemporal craniotomy with plate  fixation hardware and associated artifact. No acute fracture. Sinuses/Orbits: Opacification in frothy material within the maxillary sinuses. No acute findings in the orbits. Other: None. IMPRESSION: No acute intracranial abnormality. 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 Electronically Signed   By: Maurine Simmering M.D.   On: 11/25/2021 13:58   US RENAL  Result Date: 11/26/2021 CLINICAL DATA:  Acute kidney injury. EXAM: RENAL / URINARY TRACT ULTRASOUND COMPLETE COMPARISON:  CT, 11/25/2021. FINDINGS: Right Kidney: Renal measurements: 9.3 x 5.6 x 5.6 cm = volume: 153.3 mL. Increased parenchymal echogenicity. No mass, stone or hydronephrosis. Left Kidney: Renal measurements:  10.1 x 5.7 x 4.2 cm = volume: 121.2 mL. Increased parenchymal echogenicity. No mass, stone or hydronephrosis. Bladder: Appears normal for degree of bladder distention. Other: None. IMPRESSION: 1. No acute findings.  No hydronephrosis. 2. Bilateral increased renal parenchymal echogenicity consistent with medical renal disease. Electronically Signed   By: Lajean Manes M.D.   On: 11/26/2021 10:07   CT CHEST ABDOMEN PELVIS WO CONTRAST  Result Date: 11/25/2021 CLINICAL DATA:  Pt comes into the ED via POV c/ob [facility] that started several days ago. Pt states he fell down a hill a couple days ago and landed on the right side of his ribs. Pt also has h/o HTN and is noncompliant with meds. Pt has even and unlabored respirations at this time. EXAM: CT CHEST, ABDOMEN AND PELVIS WITHOUT CONTRAST TECHNIQUE: Multidetector CT imaging of the chest, abdomen and pelvis was performed following the standard protocol without IV contrast. COMPARISON:  07/24/2015 FINDINGS: CT CHEST FINDINGS Cardiovascular: Heart normal in size. No pericardial effusion. 3 vessel coronary artery calcifications. Great vessels are normal in caliber. Mediastinum/Nodes: No neck base, mediastinal or hilar masses or enlarged  lymph nodes. Trachea and esophagus are unremarkable. Lungs/Pleura: Bilateral interstitial thickening, most prominent in the bases. There is additional mild dependent lung base opacity in the lower lobes consistent with atelectasis. No lung consolidation to suggest pneumonia. Trace pleural effusions. No pneumothorax. Musculoskeletal: Subtle nondisplaced fractures of the anterior right fourth, fifth and sixth ribs. No other acute fractures. There are additional old healed right rib fractures. No bone lesions. CT ABDOMEN PELVIS FINDINGS Hepatobiliary: No focal liver abnormality is seen. No gallstones, gallbladder wall thickening, or biliary dilatation. Pancreas: Unremarkable. No pancreatic ductal dilatation or surrounding inflammatory changes. Spleen: Normal in size without focal abnormality. Adrenals/Urinary Tract: No adrenal masses. Kidneys normal in size, orientation and position. No renal masses or stones. No hydronephrosis. Mild bilateral perinephric stranding, presumed chronic. Ureters are normal in course and in caliber. Bladder is unremarkable. Stomach/Bowel: Normal stomach. Bowel anastomosis staple line noted along the mid sigmoid colon. Small bowel and colon are normal in caliber. No wall thickening. No inflammation. Normal appendix visualized. Vascular/Lymphatic: Aortic atherosclerosis. No aneurysm. No enlarged lymph nodes. Reproductive: Unremarkable. Other: Anterior abdominal wall midline incision. No hernia. No ascites. Musculoskeletal: No fracture or acute finding.  No bone lesion. IMPRESSION: 1. Subtle nondisplaced fractures of the anterior right fourth, fifth and sixth ribs, which appear acute. 2. Lungs with bilateral interstitial thickening and trace pleural effusions, findings consistent with interstitial pulmonary edema. 3. No other acute abnormality within the chest, abdomen or pelvis. 4. Aortic atherosclerosis. Electronically Signed   By: Lajean Manes M.D.   On: 11/25/2021 14:00     Assessment  & Plan: Derek Cooley. is a 55 y.o.  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  Acute kidney injury secondary to uncontrolled hypertension Renal ultrasound negative for obstruction, no IV contrast exposure No acute indication for dialysis at this time.  Protein evidenced in UA.  Will order needed lab studies to evaluate further including SPEP, UPEP, phosphorus, PTH, ANA, ANCA, antibodies.  We will continue to monitor at this time.  Avoid nephrotoxic agents and therapies, if possible.  2.  Hypertension with chronic kidney disease.  Admits to medication noncompliance for  previous year.  Home regimen included amlodipine, carvedilol, furosemide, and lisinopril.  Receiving these medications currently.  Carvedilol added to regimen by our team.  We will continue to monitor BP, remains elevated at this time, 188/103.  3.  Diabetes mellitus with chronic kidney disease.  Hemoglobin A1c 6.8 on 04/17/2021.  Non-insulin-dependent.  Home regimen included glipizide. Primary team to manage sliding scale insulin.    LOS: 1   12/18/202211:12 AM

## 2021-11-26 NOTE — Consult Note (Signed)
Cardiology Consultation:   Patient ID: Derek Cooley. MRN: [id number]; DOB: 1966-06-13  Admit date: 11/25/2021 Date of Consult: 11/26/2021  PCP:  Derek Billings, NP   [facility] Providers Cardiologist:  None        Patient Profile:   Derek Cooley. is a 55 y.o. male with a hx of chronic MRSA infection, substance abuse, ongoing who is being seen 11/26/2021 for the evaluation of sob and HTN emergency at the request of Dr. Posey Cooley.  History of Present Illness:   Derek Cooley is a 55 yo man with a h/o chronic renal insufficiency, HTN, remote subarachnoid hemorrhage, s/p clipping, who was admitted with worsening SOB. The patient denied anginal symptoms and his ECG did not show any acute STT changes. He has not had a 2D echo in our system. He has been off of suppressive anti-biotic therapy for over a year with no recurrent infectious symptoms. The patient's troponin was minimally elevated. He has stage 4 renal failure.    Past Medical History:  Diagnosis Date   Abdominal abscess    Acute pancreatitis    Anxiety    Asthma    Back pain    Depression    Diabetes mellitus without complication ([place])    Emphysema of lung ([place])    right sided   GERD (gastroesophageal reflux disease)    Hemorrhage into subarachnoid space of neuraxis ([place]) 01/26/2010   Hypertension    Mild cognitive impairment    MRSA (methicillin resistant Staphylococcus aureus)    Obesity    Panic disorder    Perforated bowel ([place]) 2007   Tempoary Colostomy Bag, Skin Graft for Abd wound   Sleep apnea    sleep study 2013   Subarachnoid hemorrhage ([place]) 2011   coil placed   Type 2 diabetes mellitus without complication, without long-term current use of insulin ([place]) 04/17/2016   Vitreous hemorrhage ([place]) 03/27/2011   Overview:  Bilateral; 01/2010, from [facility]     Past Surgical History:  Procedure Laterality Date   COLON SURGERY  2007   colostomy bag placed s/p perforated bowel   COLONOSCOPY WITH PROPOFOL  N/A 05/18/2020   Procedure: COLONOSCOPY WITH PROPOFOL;  Surgeon: Derek Landsman, MD;  Location[facility] ENDOSCOPY;  Service: Gastroenterology;  Laterality: N/A;   KNEE SURGERY Right    Perforated bowel       Home Medications:  Prior to Admission medications   Medication Sig Start Date End Date Taking? Authorizing Provider  amLODipine (NORVASC) 10 MG tablet Take 1 tablet (10 mg total) by mouth daily. 04/17/21  Yes Derek Billings, NP  carvedilol (COREG) 12.5 MG tablet Take 1 tablet (12.5 mg total) by mouth 2 (two) times daily with a meal. 04/17/21  Yes Derek Billings, NP  DULoxetine (CYMBALTA) 60 MG capsule Take 1 capsule (60 mg total) by mouth 2 (two) times daily. 04/17/21  Yes Derek Billings, NP  fenofibrate micronized (LOFIBRA) 67 MG capsule TAKE 1 CAPSULE EVERY DAY BEFORE BREAKFAST 04/17/21  Yes Derek Billings, NP  furosemide (LASIX) 20 MG tablet Take 1 tablet (20 mg total) by mouth daily. 06/29/20  Yes Volney American, PA-C  glipiZIDE (GLUCOTROL XL) 5 MG 24 hr tablet TAKE ONE TABLET EACH MORNING WITH BREAKFAST 04/17/21  Yes Derek Billings, NP  glucose blood (ONETOUCH ULTRA) test strip USE TO TEST BLOOD SUGAR DAILY 08/23/20  Yes Noemi Chapel A, NP  lisinopril (ZESTRIL) 20 MG tablet Take 1 tablet (20 mg total) by mouth daily. 04/17/21  Yes [place],  Santiago Glad, NP  methocarbamol (ROBAXIN) 750 MG tablet Take 1,500 mg by mouth 4 (four) times daily. 02/25/20  Yes [provider]  pantoprazole (PROTONIX) 40 MG tablet Take 1 tablet (40 mg total) by mouth 2 (two) times daily as needed. 04/17/21  Yes Derek Billings, NP  pregabalin (LYRICA) 50 MG capsule Take 50 mg by mouth 4 (four) times daily.   Yes [provider]  topiramate (TOPAMAX) 50 MG tablet 50 mg 2 (two) times daily.  01/28/19  Yes [provider]  traZODone (DESYREL) 100 MG tablet Take 100 mg by mouth at bedtime.   Yes [provider]  albuterol (PROVENTIL) (2.5 MG/3ML) 0.083% nebulizer solution  Take 3 mLs (2.5 mg total) by nebulization every 6 (six) hours as needed for wheezing or shortness of breath. 11/18/19   Volney American, PA-C  albuterol (VENTOLIN HFA) 108 (90 Base) MCG/ACT inhaler Inhale 1-2 puffs into the lungs every 6 (six) hours as needed for wheezing or shortness of breath. 04/17/21   Derek Billings, NP  hydrOXYzine (ATARAX/VISTARIL) 50 MG tablet TAKE ONE TABLET 3 TIMES DAILY AS NEEDED 04/17/21   Derek Billings, NP  ibuprofen (ADVIL) 800 MG tablet Take 1 tablet (800 mg total) by mouth every 8 (eight) hours as needed. 08/07/19   Volney American, PA-C    Inpatient Medications: Scheduled Meds:  amLODipine  10 mg Oral Daily   carvedilol  6.25 mg Oral BID WC   DULoxetine  60 mg Oral BID   fenofibrate  54 mg Oral Daily   hydrALAZINE  25 mg Oral Q8H   insulin aspart  0-5 Units Subcutaneous QHS   insulin aspart  0-9 Units Subcutaneous TID WC   lidocaine  1 patch Transdermal Q24H   pantoprazole  40 mg Oral BID AC   topiramate  50 mg Oral BID   traZODone  100 mg Oral QHS   Continuous Infusions:  nitroGLYCERIN 10 mcg/min (11/26/21 0843)   PRN Meds: acetaminophen, hydrOXYzine  Allergies:    Allergies  Allergen Reactions   Atorvastatin     Joint Aches - Severe Joint Aches - Severe Joint Aches - Severe   Avelox [Moxifloxacin Hcl In Nacl]     Muscle pain   Buprenorphine     Mouth sores, confusion, shaking   Dilaudid [Hydromorphone Hcl] Hives   Fluoxetine Itching   Levofloxacin Other (See Comments)    Joint Pain   Morphine And Related    Other     Muscle pain   Suboxone [Buprenorphine Hcl-Naloxone Hcl] Other (See Comments)    Rash and confused   Vancomycin     Renal insufficiency    Social History:   Social History   Socioeconomic History   Marital status: Divorced    Spouse name: Not on file   Number of children: Not on file   Years of education: Not on file   Highest education level: Not on file  Occupational History   Not on file   Tobacco Use   Smoking status: Every Day    Packs/day: 2.00    Types: Cigarettes    Start date: 10/11/1984   Smokeless tobacco: Never  Vaping Use   Vaping Use: Never used  Substance and Sexual Activity   Alcohol use: Yes    Alcohol/week: 0.0 standard drinks    Comment: occasionall    Drug use: Yes    Types: "Crack" cocaine, Amphetamines   Sexual activity: Not Currently  Other Topics Concern   Not on file  Social History Narrative   Not on file   Social Determinants of Health   Financial Resource Strain: Not on file  Food Insecurity: Not on file  Transportation Needs: Not on file  Physical Activity: Not on file  Stress: Not on file  Social Connections: Not on file  Intimate Partner Violence: Not on file    Family History:    Family History  Problem Relation Age of Onset   Arthritis Mother    Asthma Mother    Diabetes Mother    Heart disease Mother    Hyperlipidemia Mother    Hypertension Mother    Kidney disease Mother    Thyroid disease Mother    Lung disease Mother    Anxiety disorder Mother    Depression Mother    Diabetes Father    Heart disease Father    Depression Father    Anxiety disorder Father    Arthritis Sister    Asthma Sister    Hyperlipidemia Sister    Hypertension Sister    Lung disease Sister    Anxiety disorder Sister    Depression Sister    Hyperlipidemia Brother    Hypertension Brother    Diabetes Sister    Heart disease Sister    Depression Sister    Anxiety disorder Sister    Anxiety disorder Brother    Depression Brother    Heart disease Brother      ROS:  Please see the history of present illness.   All other ROS reviewed and negative.     Physical Exam/Data:   Vitals:   11/26/21 0700 11/26/21 0900 11/26/21 1000 11/26/21 1100  BP: (!) 173/96 (!) 185/101 (!) 186/102 (!) 188/103  Pulse: 88 86 86 85  Resp:   20 20  Temp:      TempSrc:      SpO2: 97% 98% 98% 97%  Weight:      Height:        Intake/Output Summary  (Last 24 hours) at 11/26/2021 1114 Last data filed at 11/26/2021 1104 Gross per 24 hour  Intake --  Output 2150 ml  Net -2150 ml   Last 3 Weights 11/25/2021 05/30/2021 04/17/2021  Weight (lbs) 216 lb 14.9 oz 217 lb 219 lb 2 oz  Weight (kg) 98.4 kg 98.431 kg 99.394 kg  Some encounter information is confidential and restricted. Go to Review Flowsheets activity to see all data.     Body mass index is 33.98 kg/m.  General:  chronically ill appearing, Well nourished, well developed, in no acute distress HEENT: normal Neck: 7 cm JVD Vascular: No carotid bruits; Distal pulses 2+ bilaterally Cardiac:  normal S1, S2; RRR; no murmur  Lungs:  clear to auscultation bilaterally, no wheezing, rhonchi or rales  Abd: soft, nontender, no hepatomegaly  Ext: no edema Musculoskeletal:  No deformities, BUE and BLE strength normal and equal Skin: warm and dry  Neuro:  CNs 2-12 intact, no focal abnormalities noted Psych:  Normal affect   EKG:  The EKG was personally reviewed and demonstrates:  nsr Telemetry:  Telemetry was personally reviewed and demonstrates:  nsr with NSTT changes  Relevant CV Studies: none  Laboratory Data:  High Sensitivity Troponin:   Recent Labs  Lab 11/25/21 1117 11/25/21 1847  TROPONINIHS 123* 108*     Chemistry Recent Labs  Lab 11/25/21 1117  NA 140  K 3.9  CL 110  CO2 24  GLUCOSE 116*  BUN 41*  CREATININE 3.45*  CALCIUM 8.4*  GFRNONAA  20*  ANIONGAP 6    Recent Labs  Lab 11/25/21 1117  PROT 6.9  ALBUMIN 3.3*  AST 42*  ALT 32  ALKPHOS 99  BILITOT 0.6   Lipids No results for input(s): CHOL, TRIG, HDL, LABVLDL, LDLCALC, CHOLHDL in the last 168 hours.  Hematology Recent Labs  Lab 11/25/21 1117  WBC 11.1*  RBC 3.37*  HGB 10.4*  HCT 31.6*  MCV 93.8  MCH 30.9  MCHC 32.9  RDW 13.2  PLT 255   Thyroid No results for input(s): TSH, FREET4 in the last 168 hours.  BNP Recent Labs  Lab 11/25/21 1117  BNP 549.9*    DDimer No results for  input(s): DDIMER in the last 168 hours.   Radiology/Studies:  DG Chest 2 View  Result Date: 11/25/2021 CLINICAL DATA:  Right-sided chest wall pain. Short of breath. Patient reports that he fell down a hill a couple of days ago landing on his right side. EXAM: CHEST - 2 VIEW COMPARISON:  06/29/2020 FINDINGS: Cardiac silhouette normal in size. No mediastinal or hilar masses or evidence of adenopathy. There is interstitial thickening most evident in the bases, including thickening along the oblique fissures. No lung consolidation. Trace bilateral pleural effusions. No pneumothorax. Skeletal structures are intact. IMPRESSION: 1. Interstitial thickening with trace pleural effusions. Suspect mild interstitial edema. 2. No visualized rib fracture. Electronically Signed   By: Lajean Manes M.D.   On: 11/25/2021 12:05   CT HEAD WO CONTRAST (5MM)  Result Date: 11/25/2021 CLINICAL DATA:  Subarachnoid hemorrhage [facility]) EXAM: CT HEAD WITHOUT CONTRAST TECHNIQUE: Contiguous axial images were obtained from the base of the skull through the vertex without intravenous contrast. 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 Vascular: No hyperdense vessel or unexpected calcification. Skull: Prior right frontotemporal craniotomy with plate fixation hardware and associated artifact. No acute fracture. Sinuses/Orbits: Opacification in frothy material within the maxillary sinuses. No acute findings in the orbits. Other: None. IMPRESSION: No acute intracranial abnormality. 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 Electronically Signed   By: Maurine Simmering M.D.    On: 11/25/2021 13:58   US RENAL  Result Date: 11/26/2021 CLINICAL DATA:  Acute kidney injury. EXAM: RENAL / URINARY TRACT ULTRASOUND COMPLETE COMPARISON:  CT, 11/25/2021. FINDINGS: Right Kidney: Renal measurements: 9.3 x 5.6 x 5.6 cm = volume: 153.3 mL. Increased parenchymal echogenicity. No mass, stone or hydronephrosis. Left Kidney: Renal measurements: 10.1 x 5.7 x 4.2 cm = volume: 121.2 mL. Increased parenchymal echogenicity. No mass, stone or hydronephrosis. Bladder: Appears normal for degree of bladder distention. Other: None. IMPRESSION: 1. No acute findings.  No hydronephrosis. 2. Bilateral increased renal parenchymal echogenicity consistent with medical renal disease. Electronically Signed   By: Lajean Manes M.D.   On: 11/26/2021 10:07   CT CHEST ABDOMEN PELVIS WO CONTRAST  Result Date: 11/25/2021 CLINICAL DATA:  Pt comes into the ED via POV c/ob [facility] that started several days ago. Pt states he fell down a hill a couple days ago and landed on the right side of his ribs. Pt also has h/o HTN and is noncompliant with meds. Pt has even and unlabored respirations at this time. EXAM: CT CHEST, ABDOMEN AND PELVIS WITHOUT  CONTRAST TECHNIQUE: Multidetector CT imaging of the chest, abdomen and pelvis was performed following the standard protocol without IV contrast. COMPARISON:  07/24/2015 FINDINGS: CT CHEST FINDINGS Cardiovascular: Heart normal in size. No pericardial effusion. 3 vessel coronary artery calcifications. Great vessels are normal in caliber. Mediastinum/Nodes: No neck base, mediastinal or hilar masses or enlarged lymph nodes. Trachea and esophagus are unremarkable. Lungs/Pleura: Bilateral interstitial thickening, most prominent in the bases. There is additional mild dependent lung base opacity in the lower lobes consistent with atelectasis. No lung consolidation to suggest pneumonia. Trace pleural effusions. No pneumothorax. Musculoskeletal: Subtle nondisplaced fractures of the anterior right  fourth, fifth and sixth ribs. No other acute fractures. There are additional old healed right rib fractures. No bone lesions. CT ABDOMEN PELVIS FINDINGS Hepatobiliary: No focal liver abnormality is seen. No gallstones, gallbladder wall thickening, or biliary dilatation. Pancreas: Unremarkable. No pancreatic ductal dilatation or surrounding inflammatory changes. Spleen: Normal in size without focal abnormality. Adrenals/Urinary Tract: No adrenal masses. Kidneys normal in size, orientation and position. No renal masses or stones. No hydronephrosis. Mild bilateral perinephric stranding, presumed chronic. Ureters are normal in course and in caliber. Bladder is unremarkable. Stomach/Bowel: Normal stomach. Bowel anastomosis staple line noted along the mid sigmoid colon. Small bowel and colon are normal in caliber. No wall thickening. No inflammation. Normal appendix visualized. Vascular/Lymphatic: Aortic atherosclerosis. No aneurysm. No enlarged lymph nodes. Reproductive: Unremarkable. Other: Anterior abdominal wall midline incision. No hernia. No ascites. Musculoskeletal: No fracture or acute finding.  No bone lesion. IMPRESSION: 1. Subtle nondisplaced fractures of the anterior right fourth, fifth and sixth ribs, which appear acute. 2. Lungs with bilateral interstitial thickening and trace pleural effusions, findings consistent with interstitial pulmonary edema. 3. No other acute abnormality within the chest, abdomen or pelvis. 4. Aortic atherosclerosis. Electronically Signed   By: Lajean Manes M.D.   On: 11/25/2021 14:00     Assessment and Plan:   HTN emergency - his bp is still elevated. With renal insufficiency, I would add hydralazine 25 mg tid and uptitrate and continue coreg and amlodipine. With creatinine of 3.8, I would stop lisinopril. Acute on chronic heart failure - this is improved and due to volume overload. 2D echo and see above. Could also add nitrates.  Elevated troponin - I think supply demand  mismatch. No indication for heart cath, particularly in light of elevated creatinine.    Risk Assessment/Risk Scores:        New York Heart Association (NYHA) Functional Class NYHA Class III     For questions or updates, please contact [facility] Please consult [email or web address] for contact info under    Signed, Cristopher Peru, MD  11/26/2021 11:14 AM

## 2021-11-26 NOTE — ED Notes (Addendum)
Notified MD that pt's BP was gradually increasing after Nitro drip was discontinued around noon to ask for further orders. Also informed MD that pt c/o chest pain (right sided pressure and midsternal) still 7/10 and headache 10/10 after Tylenol earlier. Tramadol order placed and Nitro paste order placed.

## 2021-11-27 ENCOUNTER — Inpatient Hospital Stay (HOSPITAL_COMMUNITY)
Admit: 2021-11-27 | Discharge: 2021-11-27 | Disposition: A | Discharge status: Home / Self Care | Payer: Medicare Other | Attending: Internal Medicine | Admitting: Internal Medicine

## 2021-11-27 DIAGNOSIS — J810 Acute pulmonary edema: Secondary | ICD-10-CM

## 2021-11-27 DIAGNOSIS — I2489 Other forms of acute ischemic heart disease: Secondary | ICD-10-CM

## 2021-11-27 DIAGNOSIS — F1910 Other psychoactive substance abuse, uncomplicated: Secondary | ICD-10-CM

## 2021-11-27 DIAGNOSIS — I248 Other forms of acute ischemic heart disease: Secondary | ICD-10-CM

## 2021-11-27 DIAGNOSIS — R0603 Acute respiratory distress: Secondary | ICD-10-CM

## 2021-11-27 LAB — ECHOCARDIOGRAM COMPLETE
AR max vel: 3.29 cm2
AV Area VTI: 3.34 cm2
AV Area mean vel: 3.2 cm2
AV Mean grad: 2 mmHg
AV Peak grad: 4 mmHg
Ao pk vel: 1 m/s
Area-P 1/2: 3.81 cm2
Height: 67 in
MV VTI: 2.49 cm2
S' Lateral: 3 cm
Weight: 3470.92 oz

## 2021-11-27 LAB — ANCA PROFILE
Anti-MPO Antibodies: <0.2 units (ref 0.0–0.9)
Anti-PR3 Antibodies: <0.2 units (ref 0.0–0.9)
Atypical P-ANCA titer: <1:20 {titer}
C-ANCA: <1:20 {titer}
P-ANCA: <1:20 {titer}

## 2021-11-27 LAB — HEMOGLOBIN A1C
Hgb A1c MFr Bld: 5.4 % (ref 4.8–5.6)
Mean Plasma Glucose: 108 mg/dL

## 2021-11-27 LAB — PARATHYROID HORMONE, INTACT (NO CA): PTH: 77 pg/mL — ABNORMAL HIGH (ref 15–65)

## 2021-11-27 LAB — GLUCOSE, CAPILLARY
Glucose-Capillary: 105 mg/dL — ABNORMAL HIGH (ref 70–99)
Glucose-Capillary: 95 mg/dL (ref 70–99)
Glucose-Capillary: 116 mg/dL — ABNORMAL HIGH (ref 70–99)
Glucose-Capillary: 128 mg/dL — ABNORMAL HIGH (ref 70–99)
Glucose-Capillary: 132 mg/dL — ABNORMAL HIGH (ref 70–99)

## 2021-11-27 LAB — ANA W/REFLEX IF POSITIVE: Anti Nuclear Antibody (ANA): NEGATIVE

## 2021-11-27 LAB — GLOMERULAR BASEMENT MEMBRANE ANTIBODIES: GBM Ab: <0.2 units (ref 0.0–0.9)

## 2021-11-27 MED ORDER — CARVEDILOL 25 MG PO TABS
25.0000 mg | ORAL_TABLET | Freq: Two times a day (BID) | ORAL | Status: DC
  Administered 2021-11-27 – 2021-11-28 (×2): 25 mg via ORAL
  Filled 2021-11-27 (×2): qty 1

## 2021-11-27 MED ORDER — HYDRALAZINE HCL 50 MG PO TABS
50.0000 mg | ORAL_TABLET | Freq: Three times a day (TID) | ORAL | Status: DC
  Administered 2021-11-27 – 2021-11-28 (×4): 50 mg via ORAL
  Filled 2021-11-27 (×4): qty 1

## 2021-11-27 NOTE — Progress Notes (Signed)
°  Transition of Care (TOC) Screening Note   Patient Details  Name: Derek Cooley. Date of Birth: [birth year]   Transition of Care [facility]) CM/SW Contact:    Alberteen Sam, LCSW Phone Number: 11/27/2021, 9:36 AM    Transition of Care Department [facility]) has reviewed patient and no TOC needs have been identified at this time. We will continue to monitor patient advancement through interdisciplinary progression rounds. If new patient transition needs arise, please place a TOC consult.

## 2021-11-27 NOTE — Progress Notes (Signed)
[facility] NAME: Derek Cooley    MR#:  [id number]  DATE OF BIRTH:  June 01, 1966  SUBJECTIVE:  patient came in with shortness of breath placed on BiPAP earlier now off of it. Has been noncompliant with meds. No family at bedside. Blood pressure remains high. Trying to wean Nitro down with adding oral BP meds.  Overall feels better. Blood pressure much improved from yesterday. Ate good breakfast. Good urine output. Family at bedside.   REVIEW OF SYSTEMS:   Review of Systems  Constitutional:  Negative for chills, fever and weight loss.  HENT:  Negative for ear discharge, ear pain and nosebleeds.   Eyes:  Negative for blurred vision, pain and discharge.  Respiratory:  Positive for shortness of breath. Negative for sputum production, wheezing and stridor.   Cardiovascular:  Positive for chest pain. Negative for palpitations, orthopnea and PND.  Gastrointestinal:  Negative for abdominal pain, diarrhea, nausea and vomiting.  Genitourinary:  Negative for frequency and urgency.  Musculoskeletal:  Negative for back pain and joint pain.  Neurological:  Positive for weakness and headaches. Negative for sensory change, speech change and focal weakness.  Psychiatric/Behavioral:  Negative for depression and hallucinations. The patient is not nervous/anxious.   Tolerating Diet:yes Tolerating PT: not needed  DRUG ALLERGIES:   Allergies  Allergen Reactions   Atorvastatin     Joint Aches - Severe Joint Aches - Severe Joint Aches - Severe   Avelox [Moxifloxacin Hcl In Nacl]     Muscle pain   Buprenorphine     Mouth sores, confusion, shaking   Dilaudid [Hydromorphone Hcl] Hives   Fluoxetine Itching   Levofloxacin Other (See Comments)    Joint Pain   Morphine And Related    Other     Muscle pain   Suboxone [Buprenorphine Hcl-Naloxone Hcl] Other (See Comments)    Rash and confused   Vancomycin     Renal insufficiency    VITALS:  Blood  pressure (!) 148/88, pulse 71, temperature 97.6 F (36.4 C), resp. rate 15, height 5\' 7"  (1.702 m), weight 98.4 kg, SpO2 97 %.  PHYSICAL EXAMINATION:   Physical Exam  GENERAL:  55-year-old patient lying in the bed with no acute distress.  HEENT: Head atraumatic, normocephalic. Oropharynx and nasopharynx clear.  LUNGS: Normal breath sounds bilaterally, no wheezing, rales, rhonchi. No use of accessory muscles of respiration.  CARDIOVASCULAR: S1, S2 normal. No murmurs, rubs, or gallops.  ABDOMEN: Soft, nontender, nondistended. Bowel sounds present. No organomegaly or mass.  EXTREMITIES: No cyanosis, clubbing or edema b/l.    NEUROLOGIC: nonfocal PSYCHIATRIC:  patient is alert and oriented x 3.  SKIN: No obvious rash, lesion, or ulcer.   LABORATORY PANEL:  CBC Recent Labs  Lab 11/26/21 2020  WBC 9.9  HGB 10.0*  HCT 29.7*  PLT 279     Chemistries  Recent Labs  Lab 11/26/21 2020  NA 139  K 4.4  CL 105  CO2 28  GLUCOSE 132*  BUN 46*  CREATININE 4.17*  CALCIUM 8.2*  MG 2.0  AST 19  ALT 21  ALKPHOS 94  BILITOT 0.7    Cardiac Enzymes No results for input(s): TROPONINI in the last 168 hours. RADIOLOGY:  US RENAL  Result Date: 11/26/2021 CLINICAL DATA:  Acute kidney injury. EXAM: RENAL / URINARY TRACT ULTRASOUND COMPLETE COMPARISON:  CT, 11/25/2021. FINDINGS: Right Kidney: Renal measurements: 9.3 x 5.6 x 5.6 cm = volume: 153.3 mL. Increased parenchymal echogenicity.  No mass, stone or hydronephrosis. Left Kidney: Renal measurements: 10.1 x 5.7 x 4.2 cm = volume: 121.2 mL. Increased parenchymal echogenicity. No mass, stone or hydronephrosis. Bladder: Appears normal for degree of bladder distention. Other: None. IMPRESSION: 1. No acute findings.  No hydronephrosis. 2. Bilateral increased renal parenchymal echogenicity consistent with medical renal disease. Electronically Signed   By: Lajean Manes M.D.   On: 11/26/2021 10:07   ECHOCARDIOGRAM COMPLETE  Result Date:  11/27/2021    ECHOCARDIOGRAM REPORT   Patient Name:   Derek Cooley. Date of Exam: 11/27/2021 Medical Rec #:  [id number]          Height:       67.0 in Accession #:    [id number]         Weight:       216.9 lb Date of Birth:  [birth year]         BSA:          2.093 m Patient Age:    55 years           BP:           157/100 mmHg Patient Gender: M                  HR:           72 bpm. Exam Location:  [facility] Procedure: 2D Echo, Cardiac Doppler and Color Doppler Indications:     Acute Respiratory distress R06.03  History:         Patient has no prior history of Echocardiogram examinations.                  Risk Factors:Hypertension and Diabetes. Anxiety.  Sonographer:     Sherrie Sport Referring Phys:  [place] Diagnosing Phys: Kathlyn Sacramento MD IMPRESSIONS  1. Left ventricular ejection fraction, by estimation, is 55 to 60%. The left ventricle has normal function. The left ventricle has no regional wall motion abnormalities. There is moderate left ventricular hypertrophy. Left ventricular diastolic parameters are consistent with Grade I diastolic dysfunction (impaired relaxation).  2. Right ventricular systolic function is normal. The right ventricular size is normal. Tricuspid regurgitation signal is inadequate for assessing PA pressure.  3. Left atrial size was mildly dilated.  4. The mitral valve is normal in structure. No evidence of mitral valve regurgitation. No evidence of mitral stenosis.  5. The aortic valve is normal in structure. Aortic valve regurgitation is not visualized. Aortic valve sclerosis is present, with no evidence of aortic valve stenosis. FINDINGS  Left Ventricle: Left ventricular ejection fraction, by estimation, is 55 to 60%. The left ventricle has normal function. The left ventricle has no regional wall motion abnormalities. The left ventricular internal cavity size was normal in size. There is  moderate left ventricular hypertrophy. Left ventricular diastolic parameters are consistent  with Grade I diastolic dysfunction (impaired relaxation). Right Ventricle: The right ventricular size is normal. No increase in right ventricular wall thickness. Right ventricular systolic function is normal. Tricuspid regurgitation signal is inadequate for assessing PA pressure. Left Atrium: Left atrial size was mildly dilated. Right Atrium: Right atrial size was normal in size. Pericardium: There is no evidence of pericardial effusion. Mitral Valve: The mitral valve is normal in structure. No evidence of mitral valve regurgitation. No evidence of mitral valve stenosis. MV peak gradient, 3.0 mmHg. The mean mitral valve gradient is 1.0 mmHg. Tricuspid Valve: The tricuspid valve is normal in structure. Tricuspid valve regurgitation is not  demonstrated. No evidence of tricuspid stenosis. Aortic Valve: The aortic valve is normal in structure. Aortic valve regurgitation is not visualized. Aortic valve sclerosis is present, with no evidence of aortic valve stenosis. Aortic valve mean gradient measures 2.0 mmHg. Aortic valve peak gradient measures 4.0 mmHg. Aortic valve area, by VTI measures 3.34 cm. Pulmonic Valve: The pulmonic valve was normal in structure. Pulmonic valve regurgitation is not visualized. No evidence of pulmonic stenosis. Aorta: The aortic root is normal in size and structure. Venous: The inferior vena cava was not well visualized. IAS/Shunts: No atrial level shunt detected by color flow Doppler.  LEFT VENTRICLE PLAX 2D LVIDd:         4.70 cm   Diastology LVIDs:         3.00 cm   LV e' medial:    4.57 cm/s LV PW:         1.90 cm   LV E/e' medial:  14.4 LV IVS:        1.25 cm   LV e' lateral:   4.13 cm/s LVOT diam:     2.10 cm   LV E/e' lateral: 16.0 LV SV:         53 LV SV Index:   25 LVOT Area:     3.46 cm  RIGHT VENTRICLE RV Basal diam:  3.80 cm RV S prime:     17.20 cm/s TAPSE (M-mode): 4.5 cm LEFT ATRIUM             Index        RIGHT ATRIUM           Index LA diam:        4.10 cm 1.96 cm/m   RA  Area:     13.40 cm LA Vol (A2C):   70.6 ml 33.73 ml/m  RA Volume:   31.30 ml  14.95 ml/m LA Vol (A4C):   62.4 ml 29.81 ml/m LA Biplane Vol: 68.2 ml 32.58 ml/m  AORTIC VALVE                    PULMONIC VALVE AV Area (Vmax):    3.29 cm     PV Vmax:        0.85 m/s AV Area (Vmean):   3.20 cm     PV Vmean:       63.600 cm/s AV Area (VTI):     3.34 cm     PV VTI:         0.165 m AV Vmax:           99.55 cm/s   PV Peak grad:   2.9 mmHg AV Vmean:          64.450 cm/s  PV Mean grad:   2.0 mmHg AV VTI:            0.158 m      RVOT Peak grad: 5 mmHg AV Peak Grad:      4.0 mmHg AV Mean Grad:      2.0 mmHg LVOT Vmax:         94.60 cm/s LVOT Vmean:        59.600 cm/s LVOT VTI:          0.152 m LVOT/AV VTI ratio: 0.97  AORTA Ao Root diam: 3.70 cm MITRAL VALVE               TRICUSPID VALVE MV Area (PHT): 3.81 cm    TR Peak grad:   9.4 mmHg  MV Area VTI:   2.49 cm    TR Vmax:        153.00 cm/s MV Peak grad:  3.0 mmHg MV Mean grad:  1.0 mmHg    SHUNTS MV Vmax:       0.86 m/s    Systemic VTI:  0.15 m MV Vmean:      51.5 cm/s   Systemic Diam: 2.10 cm MV Decel Time: 199 msec    Pulmonic VTI:  0.213 m MV E velocity: 66.00 cm/s MV A velocity: 87.80 cm/s MV E/A ratio:  0.75 Kathlyn Sacramento MD Electronically signed by Kathlyn Sacramento MD Signature Date/Time: 11/27/2021/9:08:48 AM    Final    ASSESSMENT AND PLAN:  Mr. Fillinger is a 55 yo man with a h/o chronic renal insufficiency, HTN, remote subarachnoid hemorrhage, s/p clipping, who was admitted with worsening SOB. Patient has been noncompliant with his medications. He uses cocaine and amphetamines. Came in with malignant hypertension was started on IV Nitro drip.  Admits to using cocaine prior to coming to the hospital due to "panic attack  Due to respiratory extremis, the patient was placed on BiPAP therapy.  He was also extremely hypertensive with initial systolic pressure of 818 and a diastolic of 563.  He was placed on nitroglycerin drip. Patient currently is off BiPAP  drip.   CT chest demonstrated some subtle nondisplaced right fourth fifth sixth rib fractures.  There is some interstitial pulmonary edema.   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   Chest x-ray demonstrated interstitial edema. EKG showed normal sinus rhythm.  Malignant hypertension medication noncompliance -- patient came in with significantly elevated blood pressure. -- Started on IV Nitro drip which has been weaned off -- patient started now on amlodipine, and hydralazine and Coreg -- will also consider Nitro paste -- PRN pain meds for headache from Nitro -- patient advised medication compliance -12/19-- DC Nitro pace. Increase dose of BP meds. Patient advised importance of low salt diet and medication compliance  flash pulmonary edema with CHF in the setting of malignant hypertension elevated troponin appears demand ischemia -- Echo pending -- patient had been on BiPAP now on nasal cannula oxyge-- wean to room air is able to -- patient seen by Dr. Lovena Le [facility] appreciate input -- BNP 549  nondisplaced right fourth fifth sixth rib fracture -- PRN pain meds  acute on chronic kidney disease stage 3a, with history of diabetes and hypertension -- creatinine 1.95 -- came in with creatinine of 3.5--4.17 -- nephrology consultation with Dr. 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 -- Sugars well-controlled. Hold off PO meds.  Polysubstance drug abuse tobacco abuse -- urine drug screen positive for amphetamine and cocaine -- advised cessation   Procedures: Family communication : sister [name]     Consults : [facility] cardiology, nephrology CODE STATUS: full DVT Prophylaxis : enoxaparin renal dose  level of care: Progressive Status is:  Inpatient  Remains inpatient appropriate because: malignant hypertension, CHF, CKD        TOTAL TIME TAKING CARE OF THIS PATIENT: 25 minutes.  >50% time spent on counselling and coordination of care  Note: This dictation was prepared with Dragon dictation along with smaller phrase technology. Any transcriptional errors that result from this process are unintentional.  Norville Haggard.D  [facility]   CC: Primary care physician; Jon Billings, NP Patient ID: Derek Binet., male   DOB: October 10, 1966, 55 y.o.   MRN: [id number]

## 2021-11-27 NOTE — Progress Notes (Signed)
Progress Note  Patient Name: Derek Cooley. Date of Encounter: 11/27/2021  [facility] Cardiologist: None   Subjective   No chest pain or shortness of breath.  He has known history of essential hypertension but was not taking medications before hospitalized.  Inpatient Medications    Scheduled Meds:  amLODipine  10 mg Oral Daily   carvedilol  25 mg Oral BID WC   DULoxetine  60 mg Oral BID   enoxaparin (LOVENOX) injection  30 mg Subcutaneous QHS   fenofibrate  54 mg Oral Daily   hydrALAZINE  50 mg Oral Q8H   insulin aspart  0-5 Units Subcutaneous QHS   insulin aspart  0-9 Units Subcutaneous TID WC   lidocaine  1 patch Transdermal Q24H   pantoprazole  40 mg Oral BID AC   topiramate  50 mg Oral BID   traZODone  100 mg Oral QHS   Continuous Infusions:  PRN Meds: acetaminophen, acetaminophen, hydrALAZINE, hydrOXYzine, ondansetron (ZOFRAN) IV, traMADol   Vital Signs    Vitals:   11/27/21 0025 11/27/21 0601 11/27/21 0916 11/27/21 1244  BP: (!) 142/81 (!) 157/100 (!) 152/107 (!) 148/88  Pulse: 73 72 78 71  Resp: 18 16 16 15   Temp: 97.7 F (36.5 C) 98.8 F (37.1 C) (!) 97.5 F (36.4 C) 97.6 F (36.4 C)  TempSrc: Oral     SpO2: 95% 95% 99% 97%  Weight:      Height:        Intake/Output Summary (Last 24 hours) at 11/27/2021 1605 Last data filed at 11/26/2021 1715 Gross per 24 hour  Intake --  Output 500 ml  Net -500 ml   Last 3 Weights 11/25/2021 05/30/2021 04/17/2021  Weight (lbs) 216 lb 14.9 oz 217 lb 219 lb 2 oz  Weight (kg) 98.4 kg 98.431 kg 99.394 kg  Some encounter information is confidential and restricted. Go to Review Flowsheets activity to see all data.      Telemetry    Normal sinus rhythm- Personally Reviewed  ECG     - Personally Reviewed  Physical Exam   GEN: No acute distress.   Neck: No JVD Cardiac: RRR, no murmurs, rubs, or gallops.  Respiratory: Clear to auscultation bilaterally. GI: Soft, nontender, non-distended  MS: No  edema; No deformity. Neuro:  Nonfocal  Psych: Normal affect   Labs    High Sensitivity Troponin:   Recent Labs  Lab 11/25/21 1117 11/25/21 1847  TROPONINIHS 123* 108*     Chemistry Recent Labs  Lab 11/25/21 1117 11/26/21 1128 11/26/21 2020  NA 140 141 139  K 3.9 4.0 4.4  CL 110 107 105  CO2 24 26 28   GLUCOSE 116* 155* 132*  BUN 41* 42* 46*  CREATININE 3.45* 3.84* 4.17*  CALCIUM 8.4* 8.5* 8.2*  MG  --   --  2.0  PROT 6.9  --  6.1*  ALBUMIN 3.3*  --  2.9*  AST 42*  --  19  ALT 32  --  21  ALKPHOS 99  --  94  BILITOT 0.6  --  0.7  GFRNONAA 20* 18* 16*  ANIONGAP 6 8 6     Lipids No results for input(s): CHOL, TRIG, HDL, LABVLDL, LDLCALC, CHOLHDL in the last 168 hours.  Hematology Recent Labs  Lab 11/25/21 1117 11/26/21 2020  WBC 11.1* 9.9  RBC 3.37* 3.22*  HGB 10.4* 10.0*  HCT 31.6* 29.7*  MCV 93.8 92.2  MCH 30.9 31.1  MCHC 32.9 33.7  RDW 13.2  13.3  PLT 255 279   Thyroid No results for input(s): TSH, FREET4 in the last 168 hours.  BNP Recent Labs  Lab 11/25/21 1117  BNP 549.9*    DDimer No results for input(s): DDIMER in the last 168 hours.   Radiology    US RENAL  Result Date: 11/26/2021 CLINICAL DATA:  Acute kidney injury. EXAM: RENAL / URINARY TRACT ULTRASOUND COMPLETE COMPARISON:  CT, 11/25/2021. FINDINGS: Right Kidney: Renal measurements: 9.3 x 5.6 x 5.6 cm = volume: 153.3 mL. Increased parenchymal echogenicity. No mass, stone or hydronephrosis. Left Kidney: Renal measurements: 10.1 x 5.7 x 4.2 cm = volume: 121.2 mL. Increased parenchymal echogenicity. No mass, stone or hydronephrosis. Bladder: Appears normal for degree of bladder distention. Other: None. IMPRESSION: 1. No acute findings.  No hydronephrosis. 2. Bilateral increased renal parenchymal echogenicity consistent with medical renal disease. Electronically Signed   By: Lajean Manes M.D.   On: 11/26/2021 10:07   ECHOCARDIOGRAM COMPLETE  Result Date: 11/27/2021    ECHOCARDIOGRAM REPORT    Patient Name:   Derek Cooley. Date of Exam: 11/27/2021 Medical Rec #:  [id number]          Height:       67.0 in Accession #:    [id number]         Weight:       216.9 lb Date of Birth:  1966/09/09         BSA:          2.093 m Patient Age:    55 years           BP:           157/100 mmHg Patient Gender: M                  HR:           72 bpm. Exam Location:  [facility] Procedure: 2D Echo, Cardiac Doppler and Color Doppler Indications:     Acute Respiratory distress R06.03  History:         Patient has no prior history of Echocardiogram examinations.                  Risk Factors:Hypertension and Diabetes. Anxiety.  Sonographer:     Sherrie Sport Referring Phys:  [place] Diagnosing Phys: Kathlyn Sacramento MD IMPRESSIONS  1. Left ventricular ejection fraction, by estimation, is 55 to 60%. The left ventricle has normal function. The left ventricle has no regional wall motion abnormalities. There is moderate left ventricular hypertrophy. Left ventricular diastolic parameters are consistent with Grade I diastolic dysfunction (impaired relaxation).  2. Right ventricular systolic function is normal. The right ventricular size is normal. Tricuspid regurgitation signal is inadequate for assessing PA pressure.  3. Left atrial size was mildly dilated.  4. The mitral valve is normal in structure. No evidence of mitral valve regurgitation. No evidence of mitral stenosis.  5. The aortic valve is normal in structure. Aortic valve regurgitation is not visualized. Aortic valve sclerosis is present, with no evidence of aortic valve stenosis. FINDINGS  Left Ventricle: Left ventricular ejection fraction, by estimation, is 55 to 60%. The left ventricle has normal function. The left ventricle has no regional wall motion abnormalities. The left ventricular internal cavity size was normal in size. There is  moderate left ventricular hypertrophy. Left ventricular diastolic parameters are consistent with Grade I diastolic dysfunction  (impaired relaxation). Right Ventricle: The right ventricular size is normal. No increase in right  ventricular wall thickness. Right ventricular systolic function is normal. Tricuspid regurgitation signal is inadequate for assessing PA pressure. Left Atrium: Left atrial size was mildly dilated. Right Atrium: Right atrial size was normal in size. Pericardium: There is no evidence of pericardial effusion. Mitral Valve: The mitral valve is normal in structure. No evidence of mitral valve regurgitation. No evidence of mitral valve stenosis. MV peak gradient, 3.0 mmHg. The mean mitral valve gradient is 1.0 mmHg. Tricuspid Valve: The tricuspid valve is normal in structure. Tricuspid valve regurgitation is not demonstrated. No evidence of tricuspid stenosis. Aortic Valve: The aortic valve is normal in structure. Aortic valve regurgitation is not visualized. Aortic valve sclerosis is present, with no evidence of aortic valve stenosis. Aortic valve mean gradient measures 2.0 mmHg. Aortic valve peak gradient measures 4.0 mmHg. Aortic valve area, by VTI measures 3.34 cm. Pulmonic Valve: The pulmonic valve was normal in structure. Pulmonic valve regurgitation is not visualized. No evidence of pulmonic stenosis. Aorta: The aortic root is normal in size and structure. Venous: The inferior vena cava was not well visualized. IAS/Shunts: No atrial level shunt detected by color flow Doppler.  LEFT VENTRICLE PLAX 2D LVIDd:         4.70 cm   Diastology LVIDs:         3.00 cm   LV e' medial:    4.57 cm/s LV PW:         1.90 cm   LV E/e' medial:  14.4 LV IVS:        1.25 cm   LV e' lateral:   4.13 cm/s LVOT diam:     2.10 cm   LV E/e' lateral: 16.0 LV SV:         53 LV SV Index:   25 LVOT Area:     3.46 cm  RIGHT VENTRICLE RV Basal diam:  3.80 cm RV S prime:     17.20 cm/s TAPSE (M-mode): 4.5 cm LEFT ATRIUM             Index        RIGHT ATRIUM           Index LA diam:        4.10 cm 1.96 cm/m   RA Area:     13.40 cm LA Vol (A2C):    70.6 ml 33.73 ml/m  RA Volume:   31.30 ml  14.95 ml/m LA Vol (A4C):   62.4 ml 29.81 ml/m LA Biplane Vol: 68.2 ml 32.58 ml/m  AORTIC VALVE                    PULMONIC VALVE AV Area (Vmax):    3.29 cm     PV Vmax:        0.85 m/s AV Area (Vmean):   3.20 cm     PV Vmean:       63.600 cm/s AV Area (VTI):     3.34 cm     PV VTI:         0.165 m AV Vmax:           99.55 cm/s   PV Peak grad:   2.9 mmHg AV Vmean:          64.450 cm/s  PV Mean grad:   2.0 mmHg AV VTI:            0.158 m      RVOT Peak grad: 5 mmHg AV Peak Grad:      4.0  mmHg AV Mean Grad:      2.0 mmHg LVOT Vmax:         94.60 cm/s LVOT Vmean:        59.600 cm/s LVOT VTI:          0.152 m LVOT/AV VTI ratio: 0.97  AORTA Ao Root diam: 3.70 cm MITRAL VALVE               TRICUSPID VALVE MV Area (PHT): 3.81 cm    TR Peak grad:   9.4 mmHg MV Area VTI:   2.49 cm    TR Vmax:        153.00 cm/s MV Peak grad:  3.0 mmHg MV Mean grad:  1.0 mmHg    SHUNTS MV Vmax:       0.86 m/s    Systemic VTI:  0.15 m MV Vmean:      51.5 cm/s   Systemic Diam: 2.10 cm MV Decel Time: 199 msec    Pulmonic VTI:  0.213 m MV E velocity: 66.00 cm/s MV A velocity: 87.80 cm/s MV E/A ratio:  0.75 Kathlyn Sacramento MD Electronically signed by Kathlyn Sacramento MD Signature Date/Time: 11/27/2021/9:08:48 AM    Final     Cardiac Studies   Echocardiogram was done today and was personally reviewed by me.  It showed normal LV systolic function with moderate left ventricular hypertrophy and no significant valvular abnormalities.  Patient Profile     55 y.o. male  with a hx of uncontrolled hypertension, chronic MRSA infection, substance abuse, chronic kidney disease and type 2 diabetes who presented with respiratory distress in the setting of cocaine use and elevated blood pressure.    Assessment & Plan    1.  Hypertensive emergency with acute on chronic kidney disease and acute on chronic diastolic heart failure, blood pressure seems to be more controlled now with the addition of  amlodipine, carvedilol and hydralazine.  2.  Acute on chronic diastolic heart failure: Likely due to uncontrolled hypertension.  Echocardiogram today showed normal LV systolic function moderate LVH suggestive of longstanding hypertension.  No need for diuresis at the present time especially with worsening renal function.  3.  Polysubstance abuse: Urine drug screen positive for cocaine and amphetamine.  4.  Mildly elevated troponin: Likely due to supply demand ischemia.  Echo showed no wall motion abnormalities.  No plans for ischemic cardiac evaluation at the present time but outpatient stress testing can be considered.      For questions or updates, please contact [place] Please consult [email or web address] for contact info under        Signed, Kathlyn Sacramento, MD  11/27/2021, 4:05 PM

## 2021-11-27 NOTE — Progress Notes (Signed)
[facility]  ROUNDING NOTE   Subjective:   Patient seen sitting up in bed Tolerating small meals, appetite remains poor Denies shortness of breath  Creatinine 4.2 Urine output recorded at 935ml on day shift yesterday  Objective:  Vital signs in last 24 hours:  Temp:  [97.5 F (36.4 C)-98.8 F (37.1 C)] 97.5 F (36.4 C) (12/19 0916) Pulse Rate:  [72-94] 78 (12/19 0916) Resp:  [12-23] 16 (12/19 0916) BP: (126-188)/(67-136) 152/107 (12/19 0916) SpO2:  [93 %-99 %] 99 % (12/19 0916)  Weight change:  Filed Weights   11/25/21 1109  Weight: 98.4 kg    Intake/Output: I/O last 3 completed shifts: In: -  Out: 2950 [Urine:2950]   Intake/Output this shift:  No intake/output data recorded.  Physical Exam: General: NAD, sitting up in bed  Head: Normocephalic, atraumatic. Moist oral mucosal membranes  Eyes: Anicteric  Lungs:  Clear to auscultation, normal effort  Heart: Regular rate and rhythm  Abdomen:  Soft, nontender, nondistended  Extremities:  no peripheral edema.  Neurologic: Nonfocal, moving all four extremities  Skin: No lesions       Basic Metabolic Panel: Recent Labs  Lab 11/25/21 1117 11/26/21 1128 11/26/21 2020  NA 140 141 139  K 3.9 4.0 4.4  CL 110 107 105  CO2 24 26 28   GLUCOSE 116* 155* 132*  BUN 41* 42* 46*  CREATININE 3.45* 3.84* 4.17*  CALCIUM 8.4* 8.5* 8.2*  MG  --   --  2.0  PHOS  --  4.5  --     Liver Function Tests: Recent Labs  Lab 11/25/21 1117 11/26/21 2020  AST 42* 19  ALT 32 21  ALKPHOS 99 94  BILITOT 0.6 0.7  PROT 6.9 6.1*  ALBUMIN 3.3* 2.9*   Recent Labs  Lab 11/25/21 1117  LIPASE 37   No results for input(s): AMMONIA in the last 168 hours.  CBC: Recent Labs  Lab 11/25/21 1117 11/26/21 2020  WBC 11.1* 9.9  NEUTROABS  --  6.3  HGB 10.4* 10.0*  HCT 31.6* 29.7*  MCV 93.8 92.2  PLT 255 279    Cardiac Enzymes: No results for input(s): CKTOTAL, CKMB, CKMBINDEX, TROPONINI in the last 168  hours.  BNP: Invalid input(s): POCBNP  CBG: Recent Labs  Lab 11/26/21 1216 11/26/21 1655 11/26/21 2154 11/27/21 0914  GLUCAP 136* 129* 113* 105*    Microbiology: Results for orders placed or performed during the hospital encounter of 11/25/21  Resp Panel by RT-PCR (Flu A&B, Covid) Nasopharyngeal Swab     Status: None   Collection Time: 11/25/21 11:42 AM   Specimen: Nasopharyngeal Swab; Nasopharyngeal(NP) swabs in vial transport medium  Result Value Ref Range Status   SARS Coronavirus 2 by RT PCR NEGATIVE NEGATIVE Final    Comment: (NOTE) SARS-CoV-2 target nucleic acids are NOT DETECTED.  The SARS-CoV-2 RNA is generally detectable in upper respiratory specimens during the acute phase of infection. The lowest concentration of SARS-CoV-2 viral copies this assay can detect is 138 copies/mL. A negative result does not preclude SARS-Cov-2 infection and should not be used as the sole basis for treatment or other patient management decisions. A negative result may occur with  improper specimen collection/handling, submission of specimen other than nasopharyngeal swab, presence of viral mutation(s) within the areas targeted by this assay, and inadequate number of viral copies(<138 copies/mL). A negative result must be combined with clinical observations, patient history, and epidemiological information. The expected result is Negative.  Fact Sheet for Patients:  EntrepreneurPulse.com.au  Fact Sheet for Healthcare Providers:  IncredibleEmployment.be  This test is no t yet approved or cleared by the [place] FDA and  has been authorized for detection and/or diagnosis of SARS-CoV-2 by FDA under an Emergency Use Authorization (EUA). This EUA will remain  in effect (meaning this test can be used) for the duration of the COVID-19 declaration under Section 564(b)(1) of the Act, 21 U.S.C.section 360bbb-3(b)(1), unless the authorization is  terminated  or revoked sooner.       Influenza A by PCR NEGATIVE NEGATIVE Final   Influenza B by PCR NEGATIVE NEGATIVE Final    Comment: (NOTE) The Xpert Xpress SARS-CoV-2/FLU/RSV plus assay is intended as an aid in the diagnosis of influenza from Nasopharyngeal swab specimens and should not be used as a sole basis for treatment. Nasal washings and aspirates are unacceptable for Xpert Xpress SARS-CoV-2/FLU/RSV testing.  Fact Sheet for Patients: EntrepreneurPulse.com.au  Fact Sheet for Healthcare Providers: IncredibleEmployment.be  This test is not yet approved or cleared by the [place] FDA and has been authorized for detection and/or diagnosis of SARS-CoV-2 by FDA under an Emergency Use Authorization (EUA). This EUA will remain in effect (meaning this test can be used) for the duration of the COVID-19 declaration under Section 564(b)(1) of the Act, 21 U.S.C. section 360bbb-3(b)(1), unless the authorization is terminated or revoked.  Performed at [facility], [address] [place], [place] [address]     Coagulation Studies: No results for input(s): LABPROT, INR in the last 72 hours.  Urinalysis: Recent Labs    11/25/21 1210  COLORURINE YELLOW*  LABSPEC 1.015  PHURINE 6.0  GLUCOSEU NEGATIVE  HGBUR SMALL*  BILIRUBINUR NEGATIVE  KETONESUR NEGATIVE  PROTEINUR >=300*  NITRITE NEGATIVE  LEUKOCYTESUR NEGATIVE      Imaging: DG Chest 2 View  Result Date: 11/25/2021 CLINICAL DATA:  Right-sided chest wall pain. Short of breath. Patient reports that he fell down a hill a couple of days ago landing on his right side. EXAM: CHEST - 2 VIEW COMPARISON:  06/29/2020 FINDINGS: Cardiac silhouette normal in size. No mediastinal or hilar masses or evidence of adenopathy. There is interstitial thickening most evident in the bases, including thickening along the oblique fissures. No lung consolidation. Trace bilateral pleural  effusions. No pneumothorax. Skeletal structures are intact. IMPRESSION: 1. Interstitial thickening with trace pleural effusions. Suspect mild interstitial edema. 2. No visualized rib fracture. Electronically Signed   By: Lajean Manes M.D.   On: 11/25/2021 12:05   CT HEAD WO CONTRAST (5MM)  Result Date: 11/25/2021 CLINICAL DATA:  Subarachnoid hemorrhage [facility]) EXAM: CT HEAD WITHOUT CONTRAST TECHNIQUE: Contiguous axial images were obtained from the base of the skull through the vertex without intravenous contrast. 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 Vascular: No hyperdense vessel or unexpected calcification. Skull: Prior right frontotemporal craniotomy with plate fixation hardware and associated artifact. No acute fracture. Sinuses/Orbits: Opacification in frothy material within the maxillary sinuses. No acute findings in the orbits. Other: None. IMPRESSION: No acute intracranial abnormality. 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 Electronically Signed   By: Maurine Simmering M.D.   On: 11/25/2021 13:58   US RENAL  Result Date: 11/26/2021 CLINICAL DATA:  Acute kidney injury. EXAM: RENAL / URINARY TRACT ULTRASOUND COMPLETE COMPARISON:  CT, 11/25/2021. FINDINGS: Right Kidney: Renal measurements: 9.3 x 5.6 x 5.6 cm = volume: 153.3 mL. Increased parenchymal echogenicity. No mass, stone or hydronephrosis. Left Kidney: Renal measurements: 10.1 x 5.7 x 4.2 cm = volume: 121.2 mL. Increased parenchymal echogenicity. No mass, stone or hydronephrosis. Bladder: Appears normal for degree of bladder distention. Other: None. IMPRESSION: 1. No acute  findings.  No hydronephrosis. 2. Bilateral increased renal parenchymal echogenicity consistent with medical renal disease. Electronically Signed   By: Lajean Manes M.D.   On: 11/26/2021 10:07   ECHOCARDIOGRAM COMPLETE  Result Date: 11/27/2021    ECHOCARDIOGRAM REPORT   Patient Name:   Derek Cooley. Date of Exam: 11/27/2021 Medical Rec #:  [id number]          Height:       67.0 in Accession #:    [id number]         Weight:       216.9 lb Date of Birth:  December 26, 1965         BSA:          2.093 m Patient Age:    66 years           BP:           157/100 mmHg Patient Gender: M                  HR:           72 bpm. Exam Location:  [facility] Procedure: 2D Echo, Cardiac Doppler and Color Doppler Indications:     Acute Respiratory distress R06.03  History:         Patient has no prior history of Echocardiogram examinations.                  Risk Factors:Hypertension and Diabetes. Anxiety.  Sonographer:     Sherrie Sport Referring Phys:  [place] Diagnosing Phys: Kathlyn Sacramento MD IMPRESSIONS  1. Left ventricular ejection fraction, by estimation, is 55 to 60%. The left ventricle has normal function. The left ventricle has no regional wall motion abnormalities. There is moderate left ventricular hypertrophy. Left ventricular diastolic parameters are consistent with Grade I diastolic dysfunction (impaired relaxation).  2. Right ventricular systolic function is normal. The right ventricular size is normal. Tricuspid regurgitation signal is inadequate for assessing PA pressure.  3. Left atrial size was mildly dilated.  4. The mitral valve is normal in structure. No evidence of mitral valve regurgitation. No evidence of mitral stenosis.  5. The aortic valve is normal in structure. Aortic valve regurgitation is not visualized. Aortic valve sclerosis is present, with no evidence of aortic valve stenosis. FINDINGS  Left Ventricle: Left ventricular ejection fraction, by estimation, is 55 to 60%. The left ventricle has normal  function. The left ventricle has no regional wall motion abnormalities. The left ventricular internal cavity size was normal in size. There is  moderate left ventricular hypertrophy. Left ventricular diastolic parameters are consistent with Grade I diastolic dysfunction (impaired relaxation). Right Ventricle: The right ventricular size is normal. No increase in right ventricular wall thickness. Right ventricular systolic function is normal. Tricuspid regurgitation signal is inadequate for assessing PA pressure. Left Atrium: Left atrial size was mildly dilated. Right Atrium: Right atrial size was normal in size. Pericardium: There is no evidence of pericardial effusion. Mitral Valve: The mitral valve is normal in structure. No evidence of mitral valve regurgitation. No  evidence of mitral valve stenosis. MV peak gradient, 3.0 mmHg. The mean mitral valve gradient is 1.0 mmHg. Tricuspid Valve: The tricuspid valve is normal in structure. Tricuspid valve regurgitation is not demonstrated. No evidence of tricuspid stenosis. Aortic Valve: The aortic valve is normal in structure. Aortic valve regurgitation is not visualized. Aortic valve sclerosis is present, with no evidence of aortic valve stenosis. Aortic valve mean gradient measures 2.0 mmHg. Aortic valve peak gradient measures 4.0 mmHg. Aortic valve area, by VTI measures 3.34 cm. Pulmonic Valve: The pulmonic valve was normal in structure. Pulmonic valve regurgitation is not visualized. No evidence of pulmonic stenosis. Aorta: The aortic root is normal in size and structure. Venous: The inferior vena cava was not well visualized. IAS/Shunts: No atrial level shunt detected by color flow Doppler.  LEFT VENTRICLE PLAX 2D LVIDd:         4.70 cm   Diastology LVIDs:         3.00 cm   LV e' medial:    4.57 cm/s LV PW:         1.90 cm   LV E/e' medial:  14.4 LV IVS:        1.25 cm   LV e' lateral:   4.13 cm/s LVOT diam:     2.10 cm   LV E/e' lateral: 16.0 LV SV:         53 LV  SV Index:   25 LVOT Area:     3.46 cm  RIGHT VENTRICLE RV Basal diam:  3.80 cm RV S prime:     17.20 cm/s TAPSE (M-mode): 4.5 cm LEFT ATRIUM             Index        RIGHT ATRIUM           Index LA diam:        4.10 cm 1.96 cm/m   RA Area:     13.40 cm LA Vol (A2C):   70.6 ml 33.73 ml/m  RA Volume:   31.30 ml  14.95 ml/m LA Vol (A4C):   62.4 ml 29.81 ml/m LA Biplane Vol: 68.2 ml 32.58 ml/m  AORTIC VALVE                    PULMONIC VALVE AV Area (Vmax):    3.29 cm     PV Vmax:        0.85 m/s AV Area (Vmean):   3.20 cm     PV Vmean:       63.600 cm/s AV Area (VTI):     3.34 cm     PV VTI:         0.165 m AV Vmax:           99.55 cm/s   PV Peak grad:   2.9 mmHg AV Vmean:          64.450 cm/s  PV Mean grad:   2.0 mmHg AV VTI:            0.158 m      RVOT Peak grad: 5 mmHg AV Peak Grad:      4.0 mmHg AV Mean Grad:      2.0 mmHg LVOT Vmax:         94.60 cm/s LVOT Vmean:        59.600 cm/s LVOT VTI:          0.152 m LVOT/AV VTI ratio: 0.97  AORTA Ao Root diam: 3.70 cm MITRAL  VALVE               TRICUSPID VALVE MV Area (PHT): 3.81 cm    TR Peak grad:   9.4 mmHg MV Area VTI:   2.49 cm    TR Vmax:        153.00 cm/s MV Peak grad:  3.0 mmHg MV Mean grad:  1.0 mmHg    SHUNTS MV Vmax:       0.86 m/s    Systemic VTI:  0.15 m MV Vmean:      51.5 cm/s   Systemic Diam: 2.10 cm MV Decel Time: 199 msec    Pulmonic VTI:  0.213 m MV E velocity: 66.00 cm/s MV A velocity: 87.80 cm/s MV E/A ratio:  0.75 Kathlyn Sacramento MD Electronically signed by Kathlyn Sacramento MD Signature Date/Time: 11/27/2021/9:08:48 AM    Final    CT CHEST ABDOMEN PELVIS WO CONTRAST  Result Date: 11/25/2021 CLINICAL DATA:  Pt comes into the ED via POV c/ob [facility] that started several days ago. Pt states he fell down a hill a couple days ago and landed on the right side of his ribs. Pt also has h/o HTN and is noncompliant with meds. Pt has even and unlabored respirations at this time. EXAM: CT CHEST, ABDOMEN AND PELVIS WITHOUT CONTRAST TECHNIQUE:  Multidetector CT imaging of the chest, abdomen and pelvis was performed following the standard protocol without IV contrast. COMPARISON:  07/24/2015 FINDINGS: CT CHEST FINDINGS Cardiovascular: Heart normal in size. No pericardial effusion. 3 vessel coronary artery calcifications. Great vessels are normal in caliber. Mediastinum/Nodes: No neck base, mediastinal or hilar masses or enlarged lymph nodes. Trachea and esophagus are unremarkable. Lungs/Pleura: Bilateral interstitial thickening, most prominent in the bases. There is additional mild dependent lung base opacity in the lower lobes consistent with atelectasis. No lung consolidation to suggest pneumonia. Trace pleural effusions. No pneumothorax. Musculoskeletal: Subtle nondisplaced fractures of the anterior right fourth, fifth and sixth ribs. No other acute fractures. There are additional old healed right rib fractures. No bone lesions. CT ABDOMEN PELVIS FINDINGS Hepatobiliary: No focal liver abnormality is seen. No gallstones, gallbladder wall thickening, or biliary dilatation. Pancreas: Unremarkable. No pancreatic ductal dilatation or surrounding inflammatory changes. Spleen: Normal in size without focal abnormality. Adrenals/Urinary Tract: No adrenal masses. Kidneys normal in size, orientation and position. No renal masses or stones. No hydronephrosis. Mild bilateral perinephric stranding, presumed chronic. Ureters are normal in course and in caliber. Bladder is unremarkable. Stomach/Bowel: Normal stomach. Bowel anastomosis staple line noted along the mid sigmoid colon. Small bowel and colon are normal in caliber. No wall thickening. No inflammation. Normal appendix visualized. Vascular/Lymphatic: Aortic atherosclerosis. No aneurysm. No enlarged lymph nodes. Reproductive: Unremarkable. Other: Anterior abdominal wall midline incision. No hernia. No ascites. Musculoskeletal: No fracture or acute finding.  No bone lesion. IMPRESSION: 1. Subtle nondisplaced  fractures of the anterior right fourth, fifth and sixth ribs, which appear acute. 2. Lungs with bilateral interstitial thickening and trace pleural effusions, findings consistent with interstitial pulmonary edema. 3. No other acute abnormality within the chest, abdomen or pelvis. 4. Aortic atherosclerosis. Electronically Signed   By: Lajean Manes M.D.   On: 11/25/2021 14:00     Medications:     amLODipine  10 mg Oral Daily   carvedilol  25 mg Oral BID WC   DULoxetine  60 mg Oral BID   enoxaparin (LOVENOX) injection  30 mg Subcutaneous QHS   fenofibrate  54 mg Oral Daily   hydrALAZINE  50 mg  Oral Q8H   insulin aspart  0-5 Units Subcutaneous QHS   insulin aspart  0-9 Units Subcutaneous TID WC   lidocaine  1 patch Transdermal Q24H   nitroGLYCERIN  1 inch Topical Q8H   pantoprazole  40 mg Oral BID AC   topiramate  50 mg Oral BID   traZODone  100 mg Oral QHS   acetaminophen, acetaminophen, hydrALAZINE, hydrOXYzine, ondansetron (ZOFRAN) IV, traMADol  Assessment/ Plan:  [name] is a 55 y.o.  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  Acute kidney injury secondary to uncontrolled hypertension Renal ultrasound negative for obstruction, no IV contrast exposure No acute indication for dialysis at this time.  Protein evidenced in UA.   -Creatinine continues to increase, which is not uncommon while obtaining control of hypertension.  -Phosphorus 4.5, PTH 77, ANA negative.  SPEP, UPEP, ANCA, antibodies pending.  We will continue to monitor at this time.   Avoid nephrotoxic agents  and therapies, if possible.  Lab Results  Component Value Date   CREATININE 4.17 (H) 11/26/2021   CREATININE 3.84 (H) 11/26/2021   CREATININE 3.45 (H) 11/25/2021    Intake/Output Summary (Last 24 hours) at 11/27/2021 1018 Last data filed at 11/26/2021 1715 Gross per 24 hour  Intake --  Output 950 ml  Net -950 ml   2.   Hypertension with chronic kidney disease.  Admits to medication noncompliance for previous year.  Home regimen included amlodipine, carvedilol, furosemide, and lisinopril.  Receiving these medications currently.  BP improving on prescribed medications.  3. Diabetes mellitus with chronic kidney disease.  Hemoglobin A1c 5.4 on 11/26/2021.  Non-insulin-dependent.  Home regimen included glipizide. Primary team to manage sliding scale insulin.      LOS: 2   12/19/202210:18 AM

## 2021-11-27 NOTE — Progress Notes (Signed)
*  PRELIMINARY RESULTS* Echocardiogram 2D Echocardiogram has been performed.  Derek Cooley 11/27/2021, 8:00 AM

## 2021-11-28 LAB — PROTEIN ELECTRO, RANDOM URINE
Albumin ELP, Urine: 76.2 %
Alpha-1-Globulin, U: 5.6 %
Alpha-2-Globulin, U: 3 %
Beta Globulin, U: 8.6 %
Gamma Globulin, U: 6.5 %
Total Protein, Urine: 193.3 mg/dL

## 2021-11-28 LAB — RENAL FUNCTION PANEL
Albumin: 3.1 g/dL — ABNORMAL LOW (ref 3.5–5.0)
Anion gap: 7 (ref 5–15)
BUN: 45 mg/dL — ABNORMAL HIGH (ref 6–20)
CO2: 24 mmol/L (ref 22–32)
Calcium: 8.8 mg/dL — ABNORMAL LOW (ref 8.9–10.3)
Chloride: 107 mmol/L (ref 98–111)
Creatinine, Ser: 4.17 mg/dL — ABNORMAL HIGH (ref 0.61–1.24)
GFR, Estimated: 16 mL/min — ABNORMAL LOW (ref >60)
Glucose, Bld: 91 mg/dL (ref 70–99)
Phosphorus: 4.2 mg/dL (ref 2.5–4.6)
Potassium: 4.6 mmol/L (ref 3.5–5.1)
Sodium: 138 mmol/L (ref 135–145)

## 2021-11-28 LAB — PROTEIN ELECTROPHORESIS, SERUM
A/G Ratio: 1.1 (ref 0.7–1.7)
Albumin ELP: 3 g/dL (ref 2.9–4.4)
Alpha-1-Globulin: 0.2 g/dL (ref 0.0–0.4)
Alpha-2-Globulin: 0.7 g/dL (ref 0.4–1.0)
Beta Globulin: 0.9 g/dL (ref 0.7–1.3)
Gamma Globulin: 0.9 g/dL (ref 0.4–1.8)
Globulin, Total: 2.7 g/dL (ref 2.2–3.9)
Total Protein ELP: 5.7 g/dL — ABNORMAL LOW (ref 6.0–8.5)

## 2021-11-28 LAB — CBC
HCT: 31.9 % — ABNORMAL LOW (ref 39.0–52.0)
Hemoglobin: 10.8 g/dL — ABNORMAL LOW (ref 13.0–17.0)
MCH: 30.7 pg (ref 26.0–34.0)
MCHC: 33.9 g/dL (ref 30.0–36.0)
MCV: 90.6 fL (ref 80.0–100.0)
Platelets: 300 10*3/uL (ref 150–400)
RBC: 3.52 MIL/uL — ABNORMAL LOW (ref 4.22–5.81)
RDW: 13.1 % (ref 11.5–15.5)
WBC: 9 10*3/uL (ref 4.0–10.5)
nRBC: 0 % (ref 0.0–0.2)

## 2021-11-28 LAB — GLUCOSE, CAPILLARY: Glucose-Capillary: 84 mg/dL (ref 70–99)

## 2021-11-28 MED ORDER — PREGABALIN 50 MG PO CAPS: 50.0000 mg | ORAL_CAPSULE | Freq: Two times a day (BID) | ORAL | 2 refills | Status: DC

## 2021-11-28 MED ORDER — CARVEDILOL 25 MG PO TABS: 25.0000 mg | ORAL_TABLET | Freq: Two times a day (BID) | ORAL | 3 refills | Status: DC

## 2021-11-28 MED ORDER — PANTOPRAZOLE SODIUM 40 MG PO TBEC: 40.0000 mg | DELAYED_RELEASE_TABLET | Freq: Two times a day (BID) | ORAL | 2 refills | Status: DC | PRN

## 2021-11-28 MED ORDER — AMLODIPINE BESYLATE 10 MG PO TABS: 10.0000 mg | ORAL_TABLET | Freq: Every day | ORAL | 2 refills | Status: DC

## 2021-11-28 MED ORDER — ALBUTEROL SULFATE HFA 108 (90 BASE) MCG/ACT IN AERS: 1.0000 | INHALATION_SPRAY | Freq: Four times a day (QID) | RESPIRATORY_TRACT | 2 refills | Status: DC | PRN

## 2021-11-28 MED ORDER — TOPIRAMATE 50 MG PO TABS: 50.0000 mg | ORAL_TABLET | Freq: Two times a day (BID) | ORAL | 1 refills | Status: DC

## 2021-11-28 MED ORDER — FENOFIBRATE MICRONIZED 67 MG PO CAPS: ORAL_CAPSULE | ORAL | 2 refills | Status: DC

## 2021-11-28 MED ORDER — DULOXETINE HCL 60 MG PO CPEP: 60.0000 mg | ORAL_CAPSULE | Freq: Every day | ORAL | 2 refills | Status: DC

## 2021-11-28 NOTE — Care Management Important Message (Signed)
Important Message  Patient Details  Name: Derek Cooley. MRN: [id number] Date of Birth: 1966/05/27   Medicare Important Message Given:  Yes     [name] 11/28/2021, 12:24 PM

## 2021-11-28 NOTE — Discharge Instructions (Signed)
Stop using street drugs. \ Keep log of blood pressure at home.

## 2021-11-28 NOTE — Discharge Summary (Signed)
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 noncompliance to medication flash pulmonary edema/fluid overload in the setting of hypertensive urgency Polysubstance abuse  SECONDARY DIAGNOSIS:   Past Medical History:  Diagnosis Date   Abdominal abscess    Acute pancreatitis    Anxiety    Asthma    Back pain    Depression    Diabetes mellitus without complication ([place])    Emphysema of lung ([place])    right sided   GERD (gastroesophageal reflux disease)    Hemorrhage into subarachnoid space of neuraxis ([place]) 01/26/2010   Hypertension    Mild cognitive impairment    MRSA (methicillin resistant Staphylococcus aureus)    Obesity    Panic disorder    Perforated bowel ([place]) 2007   Tempoary Colostomy Bag, Skin Graft for Abd wound   Sleep apnea    sleep study 2013   Subarachnoid hemorrhage ([place]) 2011   coil placed   Type 2 diabetes mellitus without complication, without long-term current use of insulin ([place]) 04/17/2016   Vitreous hemorrhage ([place]) 03/27/2011   Overview:  Bilateral; 01/2010, from [place]:  Mr. Hanf is a 55 yo man with a h/o chronic renal insufficiency, HTN, remote subarachnoid hemorrhage, s/p clipping, who was admitted with worsening SOB. Patient has been noncompliant with his medications. He uses cocaine and amphetamines. Came in with malignant hypertension was started on IV Nitro drip.  Admits to using cocaine prior to coming to the hospital due to  "panic attack   Due to respiratory distress, the patient was placed on BiPAP therapy.  He was also extremely hypertensive with initial systolic pressure of 177 and a diastolic of 939.  He was placed on nitroglycerin drip  CT chest demonstrated some subtle nondisplaced right fourth fifth sixth rib fractures.  There is some interstitial pulmonary edema.   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   Chest x-ray demonstrated interstitial edema. EKG showed normal sinus rhythm.   Malignant hypertension medication noncompliance -- patient came in with significantly elevated blood pressure. -- Started on IV Nitro drip which has been weaned off -- patient started now on amlodipine, and hydralazine and Coreg -- will also consider Nitro paste -- PRN pain meds for headache from Nitro -- patient advised medication compliance -12/19-- DC Nitro pace. Increase dose of BP meds. Patient advised importance of low salt diet and medication compliance --12/20--BP much improved. Will cont current meds at discharge   Flash pulmonary edema with CHF acute Diastolic in the setting of malignant hypertension elevated troponin appears demand ischemia -- Echo pending -- patient had been on BiPAP now on nasal cannula oxyge-- wean to room air is able to -- patient seen by Dr. Lovena Le [facility] appreciate input -- BNP 549   nondisplaced right fourth fifth sixth rib fracture -- PRN pain meds   acute on chronic kidney disease stage 3a--now IV, with history of diabetes and hypertension -- creatinine 1.95 -- came in with creatinine of 3.5--4.17--4.17 --  nephrology consultation with Dr. Zollie Scale -- renal ultrasound negative for obstruction -- patient will follow-up with Dr. Zollie Scale closely as outpatient   type II diabetes with chronic kidney disease -- A1c 5.4.11/27/2021 -- patient tmed list has glipizide -- will do sliding scale insulin for now. -- Sugars well-controlled.  Hold off PO meds.   Polysubstance drug abuse tobacco abuse -- urine drug screen positive for amphetamine and cocaine -- advised cessation     Procedures: Family communication : sister [name]   on the phone today and discussed discharge plan with follow-up cardiology nephrology. Consults : [facility] cardiology, nephrology CODE STATUS: full DVT Prophylaxis : enoxaparin renal dose  level of care: Progressive Status is: Inpatient  overall improving. Will discharge patient to home. Discharge plan discussed with patient and sister Debroah Baller on the CONSULTS OBTAINED:  Treatment Team:  Anthonette Legato, MD Evans Lance, MD  DRUG ALLERGIES:   Allergies  Allergen Reactions   Atorvastatin     Joint Aches - Severe Joint Aches - Severe Joint Aches - Severe   Avelox [Moxifloxacin Hcl In Nacl]     Muscle pain   Buprenorphine     Mouth sores, confusion, shaking   Dilaudid [Hydromorphone Hcl] Hives   Fluoxetine Itching   Levofloxacin Other (See Comments)    Joint Pain   Morphine And Related    Other     Muscle pain   Suboxone [Buprenorphine Hcl-Naloxone Hcl] Other (See Comments)    Rash and confused   Vancomycin     Renal insufficiency    DISCHARGE MEDICATIONS:   Allergies as of 11/28/2021       Reactions   Atorvastatin    Joint Aches - Severe Joint Aches - Severe Joint Aches - Severe   Avelox [moxifloxacin Hcl In Nacl]    Muscle pain   Buprenorphine    Mouth sores, confusion, shaking   Dilaudid [hydromorphone Hcl] Hives   Fluoxetine Itching   Levofloxacin Other (See Comments)   Joint Pain   Morphine And Related    Other    Muscle pain   Suboxone [buprenorphine Hcl-naloxone Hcl] Other (See Comments)   Rash and confused   Vancomycin    Renal insufficiency        Medication List     STOP taking these medications    furosemide 20 MG tablet Commonly known as: LASIX   glipiZIDE 5 MG 24 hr tablet Commonly known as: GLUCOTROL XL   ibuprofen 800 MG  tablet Commonly known as: ADVIL   lisinopril 20 MG tablet Commonly known as: ZESTRIL   methocarbamol 750 MG tablet Commonly known as: ROBAXIN       TAKE these medications    albuterol 108 (90 Base) MCG/ACT inhaler Commonly known as: VENTOLIN HFA Inhale 1-2 puffs into the lungs every 6 (six) hours as needed for wheezing or shortness of breath. What changed: Another medication with the same name was removed. Continue taking this medication, and follow the directions you see here.   amLODipine 10 MG tablet Commonly known as: NORVASC Take 1 tablet (10 mg total) by mouth daily.   carvedilol 25 MG tablet Commonly known as: COREG Take 1 tablet (25 mg total) by mouth 2 (two) times daily with a meal. What changed:  medication strength how much to take   DULoxetine 60 MG capsule Commonly known as: CYMBALTA Take 1 capsule (60 mg total) by mouth daily. What changed: when to take this   fenofibrate micronized 67 MG capsule Commonly known as:  LOFIBRA TAKE 1 CAPSULE EVERY DAY BEFORE BREAKFAST   hydrOXYzine 50 MG tablet Commonly known as: ATARAX TAKE ONE TABLET 3 TIMES DAILY AS NEEDED   OneTouch Ultra test strip Generic drug: glucose blood USE TO TEST BLOOD SUGAR DAILY   pantoprazole 40 MG tablet Commonly known as: PROTONIX Take 1 tablet (40 mg total) by mouth 2 (two) times daily as needed.   pregabalin 50 MG capsule Commonly known as: LYRICA Take 1 capsule (50 mg total) by mouth 2 (two) times daily. What changed: when to take this   topiramate 50 MG tablet Commonly known as: TOPAMAX Take 1 tablet (50 mg total) by mouth 2 (two) times daily. What changed: how to take this   traZODone 100 MG tablet Commonly known as: DESYREL Take 100 mg by mouth at bedtime.        If you experience worsening of your admission symptoms, develop shortness of breath, life threatening emergency, suicidal or homicidal thoughts you must seek medical attention immediately by calling 911 or  calling your MD immediately  if symptoms less severe.  You Must read complete instructions/literature along with all the possible adverse reactions/side effects for all the Medicines you take and that have been prescribed to you. Take any new Medicines after you have completely understood and accept all the possible adverse reactions/side effects.   Please note  You were cared for by a hospitalist during your hospital stay. If you have any questions about your discharge medications or the care you received while you were in the hospital after you are discharged, you can call the unit and asked to speak with the hospitalist on call if the hospitalist that took care of you is not available. Once you are discharged, your primary care physician will handle any further medical issues. Please note that NO REFILLS for any discharge medications will be authorized once you are discharged, as it is imperative that you return to your primary care physician (or establish a relationship with a primary care physician if you do not have one) for your aftercare needs so that they can reassess your need for medications and monitor your lab values. Today   SUBJECTIVE   no new complaints  VITAL SIGNS:  Blood pressure (!) 142/95, pulse 70, temperature 97.9 F (36.6 C), resp. rate 18, height 5\' 7"  (1.702 m), weight 89.7 kg, SpO2 98 %.  I/O:   Intake/Output Summary (Last 24 hours) at 11/28/2021 0940 Last data filed at 11/28/2021 0400 Gross per 24 hour  Intake 0 ml  Output --  Net 0 ml    PHYSICAL EXAMINATION:  GENERAL:  55 y.o.-year-old patient lying in the bed with no acute distress.  LUNGS: Normal breath sounds bilaterally, no wheezing, rales,rhonchi or crepitation. No use of accessory muscles of respiration.  CARDIOVASCULAR: S1, S2 normal. No murmurs, rubs, or gallops.  ABDOMEN: Soft, non-tender, non-distended. Bowel sounds present. No organomegaly or mass.  EXTREMITIES: No pedal edema, cyanosis, or  clubbing.  NEUROLOGIC: non-focal PSYCHIATRIC:  patient is alert and oriented x 3.  SKIN: No obvious rash, lesion, or ulcer.   DATA REVIEW:   CBC  Recent Labs  Lab 11/28/21 0752  WBC 9.0  HGB 10.8*  HCT 31.9*  PLT 300    Chemistries  Recent Labs  Lab 11/26/21 2020 11/28/21 0752  NA 139 138  K 4.4 4.6  CL 105 107  CO2 28 24  GLUCOSE 132* 91  BUN 46* 45*  CREATININE 4.17* 4.17*  CALCIUM 8.2* 8.8*  MG 2.0  --   AST 19  --   ALT 21  --   ALKPHOS 94  --   BILITOT 0.7  --     Microbiology Results   Recent Results (from the past 240 hour(s))  Resp Panel by RT-PCR (Flu A&B, Covid) Nasopharyngeal Swab     Status: None   Collection Time: 11/25/21 11:42 AM   Specimen: Nasopharyngeal Swab; Nasopharyngeal(NP) swabs in vial transport medium  Result Value Ref Range Status   SARS Coronavirus 2 by RT PCR NEGATIVE NEGATIVE Final    Comment: (NOTE) SARS-CoV-2 target nucleic acids are NOT DETECTED.  The SARS-CoV-2 RNA is generally detectable in upper respiratory specimens during the acute phase of infection. The lowest concentration of SARS-CoV-2 viral copies this assay can detect is 138 copies/mL. A negative result does not preclude SARS-Cov-2 infection and should not be used as the sole basis for treatment or other patient management decisions. A negative result may occur with  improper specimen collection/handling, submission of specimen other than nasopharyngeal swab, presence of viral mutation(s) within the areas targeted by this assay, and inadequate number of viral copies(<138 copies/mL). A negative result must be combined with clinical observations, patient history, and epidemiological information. The expected result is Negative.  Fact Sheet for Patients:  EntrepreneurPulse.com.au  Fact Sheet for Healthcare Providers:  IncredibleEmployment.be  This test is no t yet approved or cleared by the [place] FDA and  has been  authorized for detection and/or diagnosis of SARS-CoV-2 by FDA under an Emergency Use Authorization (EUA). This EUA will remain  in effect (meaning this test can be used) for the duration of the COVID-19 declaration under Section 564(b)(1) of the Act, 21 U.S.C.section 360bbb-3(b)(1), unless the authorization is terminated  or revoked sooner.       Influenza A by PCR NEGATIVE NEGATIVE Final   Influenza B by PCR NEGATIVE NEGATIVE Final    Comment: (NOTE) The Xpert Xpress SARS-CoV-2/FLU/RSV plus assay is intended as an aid in the diagnosis of influenza from Nasopharyngeal swab specimens and should not be used as a sole basis for treatment. Nasal washings and aspirates are unacceptable for Xpert Xpress SARS-CoV-2/FLU/RSV testing.  Fact Sheet for Patients: EntrepreneurPulse.com.au  Fact Sheet for Healthcare Providers: IncredibleEmployment.be  This test is not yet approved or cleared by the [place] FDA and has been authorized for detection and/or diagnosis of SARS-CoV-2 by FDA under an Emergency Use Authorization (EUA). This EUA will remain in effect (meaning this test can be used) for the duration of the COVID-19 declaration under Section 564(b)(1) of the Act, 21 U.S.C. section 360bbb-3(b)(1), unless the authorization is terminated or revoked.  Performed at [facility], [address] [place], [place] [address]     RADIOLOGY:  US RENAL  Result Date: 11/26/2021 CLINICAL DATA:  Acute kidney injury. EXAM: RENAL / URINARY TRACT ULTRASOUND COMPLETE COMPARISON:  CT, 11/25/2021. FINDINGS: Right Kidney: Renal measurements: 9.3 x 5.6 x 5.6 cm = volume: 153.3 mL. Increased parenchymal echogenicity. No mass, stone or hydronephrosis. Left Kidney: Renal measurements: 10.1 x 5.7 x 4.2 cm = volume: 121.2 mL. Increased parenchymal echogenicity. No mass, stone or hydronephrosis. Bladder: Appears normal for degree of bladder distention. Other:  None. IMPRESSION: 1. No acute findings.  No hydronephrosis. 2. Bilateral increased renal parenchymal echogenicity consistent with medical renal disease. Electronically Signed   By: Lajean Manes M.D.   On: 11/26/2021 10:07   ECHOCARDIOGRAM COMPLETE  Result Date: 11/27/2021    ECHOCARDIOGRAM REPORT   Patient  Name:   Derek Cooley. Date of Exam: 11/27/2021 Medical Rec #:  [id number]          Height:       67.0 in Accession #:    [id number]         Weight:       216.9 lb Date of Birth:  02/14/1966         BSA:          2.093 m Patient Age:    55 years           BP:           157/100 mmHg Patient Gender: M                  HR:           72 bpm. Exam Location:  [facility] Procedure: 2D Echo, Cardiac Doppler and Color Doppler Indications:     Acute Respiratory distress R06.03  History:         Patient has no prior history of Echocardiogram examinations.                  Risk Factors:Hypertension and Diabetes. Anxiety.  Sonographer:     Sherrie Sport Referring Phys:  [place] Diagnosing Phys: Kathlyn Sacramento MD IMPRESSIONS  1. Left ventricular ejection fraction, by estimation, is 55 to 60%. The left ventricle has normal function. The left ventricle has no regional wall motion abnormalities. There is moderate left ventricular hypertrophy. Left ventricular diastolic parameters are consistent with Grade I diastolic dysfunction (impaired relaxation).  2. Right ventricular systolic function is normal. The right ventricular size is normal. Tricuspid regurgitation signal is inadequate for assessing PA pressure.  3. Left atrial size was mildly dilated.  4. The mitral valve is normal in structure. No evidence of mitral valve regurgitation. No evidence of mitral stenosis.  5. The aortic valve is normal in structure. Aortic valve regurgitation is not visualized. Aortic valve sclerosis is present, with no evidence of aortic valve stenosis. FINDINGS  Left Ventricle: Left ventricular ejection fraction, by estimation, is 55 to 60%.  The left ventricle has normal function. The left ventricle has no regional wall motion abnormalities. The left ventricular internal cavity size was normal in size. There is  moderate left ventricular hypertrophy. Left ventricular diastolic parameters are consistent with Grade I diastolic dysfunction (impaired relaxation). Right Ventricle: The right ventricular size is normal. No increase in right ventricular wall thickness. Right ventricular systolic function is normal. Tricuspid regurgitation signal is inadequate for assessing PA pressure. Left Atrium: Left atrial size was mildly dilated. Right Atrium: Right atrial size was normal in size. Pericardium: There is no evidence of pericardial effusion. Mitral Valve: The mitral valve is normal in structure. No evidence of mitral valve regurgitation. No evidence of mitral valve stenosis. MV peak gradient, 3.0 mmHg. The mean mitral valve gradient is 1.0 mmHg. Tricuspid Valve: The tricuspid valve is normal in structure. Tricuspid valve regurgitation is not demonstrated. No evidence of tricuspid stenosis. Aortic Valve: The aortic valve is normal in structure. Aortic valve regurgitation is not visualized. Aortic valve sclerosis is present, with no evidence of aortic valve stenosis. Aortic valve mean gradient measures 2.0 mmHg. Aortic valve peak gradient measures 4.0 mmHg. Aortic valve area, by VTI measures 3.34 cm. Pulmonic Valve: The pulmonic valve was normal in structure. Pulmonic valve regurgitation is not visualized. No evidence of pulmonic stenosis. Aorta: The aortic root is normal in size and structure. Venous: The  inferior vena cava was not well visualized. IAS/Shunts: No atrial level shunt detected by color flow Doppler.  LEFT VENTRICLE PLAX 2D LVIDd:         4.70 cm   Diastology LVIDs:         3.00 cm   LV e' medial:    4.57 cm/s LV PW:         1.90 cm   LV E/e' medial:  14.4 LV IVS:        1.25 cm   LV e' lateral:   4.13 cm/s LVOT diam:     2.10 cm   LV E/e'  lateral: 16.0 LV SV:         53 LV SV Index:   25 LVOT Area:     3.46 cm  RIGHT VENTRICLE RV Basal diam:  3.80 cm RV S prime:     17.20 cm/s TAPSE (M-mode): 4.5 cm LEFT ATRIUM             Index        RIGHT ATRIUM           Index LA diam:        4.10 cm 1.96 cm/m   RA Area:     13.40 cm LA Vol (A2C):   70.6 ml 33.73 ml/m  RA Volume:   31.30 ml  14.95 ml/m LA Vol (A4C):   62.4 ml 29.81 ml/m LA Biplane Vol: 68.2 ml 32.58 ml/m  AORTIC VALVE                    PULMONIC VALVE AV Area (Vmax):    3.29 cm     PV Vmax:        0.85 m/s AV Area (Vmean):   3.20 cm     PV Vmean:       63.600 cm/s AV Area (VTI):     3.34 cm     PV VTI:         0.165 m AV Vmax:           99.55 cm/s   PV Peak grad:   2.9 mmHg AV Vmean:          64.450 cm/s  PV Mean grad:   2.0 mmHg AV VTI:            0.158 m      RVOT Peak grad: 5 mmHg AV Peak Grad:      4.0 mmHg AV Mean Grad:      2.0 mmHg LVOT Vmax:         94.60 cm/s LVOT Vmean:        59.600 cm/s LVOT VTI:          0.152 m LVOT/AV VTI ratio: 0.97  AORTA Ao Root diam: 3.70 cm MITRAL VALVE               TRICUSPID VALVE MV Area (PHT): 3.81 cm    TR Peak grad:   9.4 mmHg MV Area VTI:   2.49 cm    TR Vmax:        153.00 cm/s MV Peak grad:  3.0 mmHg MV Mean grad:  1.0 mmHg    SHUNTS MV Vmax:       0.86 m/s    Systemic VTI:  0.15 m MV Vmean:      51.5 cm/s   Systemic Diam: 2.10 cm MV Decel Time: 199 msec    Pulmonic VTI:  0.213 m MV E velocity: 66.00  cm/s MV A velocity: 87.80 cm/s MV E/A ratio:  0.75 Kathlyn Sacramento MD Electronically signed by Kathlyn Sacramento MD Signature Date/Time: 11/27/2021/9:08:48 AM    Final      CODE STATUS:     Code Status Orders  (From admission, onward)           Start     Ordered   11/26/21 1549  Full code  Continuous        11/26/21 1548           Code Status History     Date Active Date Inactive Code Status Order ID Comments User Context   11/25/2021 2440 11/26/2021 1548 Full Code [id number]  Kristopher Oppenheim, DO ED        TOTAL TIME  TAKING CARE OF THIS PATIENT: 40 minutes.    Fritzi Mandes M.D  [facility]    CC: Primary care physician; Jon Billings, NP

## 2021-11-28 NOTE — Progress Notes (Signed)
[facility]  ROUNDING NOTE   Subjective:   Patient seen resting quietly, alert and oriented Denies shortness of breath, reports productive cough Denies nausea and vomiting Tolerating meals well  Creatinine 4.17   Objective:  Vital signs in last 24 hours:  Temp:  [97.6 F (36.4 C)-98.4 F (36.9 C)] 97.9 F (36.6 C) (12/20 0726) Pulse Rate:  [66-74] 70 (12/20 0726) Resp:  [15-18] 18 (12/20 0726) BP: (128-148)/(67-95) 142/95 (12/20 0726) SpO2:  [96 %-98 %] 98 % (12/20 0726) Weight:  [89.7 kg] 89.7 kg (12/20 0500)  Weight change:  Filed Weights   11/25/21 1109 11/28/21 0500  Weight: 98.4 kg 89.7 kg    Intake/Output: No intake/output data recorded.   Intake/Output this shift:  No intake/output data recorded.  Physical Exam: General: NAD, sitting up in bed  Head: Normocephalic, atraumatic. Moist oral mucosal membranes  Eyes: Anicteric  Lungs:  Mild basilar wheeze, normal effort, productive cough  Heart: Regular rate and rhythm  Abdomen:  Soft, nontender, nondistended  Extremities:  no peripheral edema.  Neurologic: Nonfocal, moving all four extremities  Skin: No lesions       Basic Metabolic Panel: Recent Labs  Lab 11/25/21 1117 11/26/21 1128 11/26/21 2020 11/28/21 0752  NA 140 141 139 138  K 3.9 4.0 4.4 4.6  CL 110 107 105 107  CO2 24 26 28 24   GLUCOSE 116* 155* 132* 91  BUN 41* 42* 46* 45*  CREATININE 3.45* 3.84* 4.17* 4.17*  CALCIUM 8.4* 8.5* 8.2* 8.8*  MG  --   --  2.0  --   PHOS  --  4.5  --  4.2     Liver Function Tests: Recent Labs  Lab 11/25/21 1117 11/26/21 2020 11/28/21 0752  AST 42* 19  --   ALT 32 21  --   ALKPHOS 99 94  --   BILITOT 0.6 0.7  --   PROT 6.9 6.1*  --   ALBUMIN 3.3* 2.9* 3.1*    Recent Labs  Lab 11/25/21 1117  LIPASE 37    No results for input(s): AMMONIA in the last 168 hours.  CBC: Recent Labs  Lab 11/25/21 1117 11/26/21 2020 11/28/21 0752  WBC 11.1* 9.9 9.0  NEUTROABS  --  6.3  --    HGB 10.4* 10.0* 10.8*  HCT 31.6* 29.7* 31.9*  MCV 93.8 92.2 90.6  PLT 255 279 300     Cardiac Enzymes: No results for input(s): CKTOTAL, CKMB, CKMBINDEX, TROPONINI in the last 168 hours.  BNP: Invalid input(s): POCBNP  CBG: Recent Labs  Lab 11/27/21 1242 11/27/21 1700 11/27/21 2019 11/27/21 2124 11/28/21 0748  GLUCAP 95 116* 128* 132* 84     Microbiology: Results for orders placed or performed during the hospital encounter of 11/25/21  Resp Panel by RT-PCR (Flu A&B, Covid) Nasopharyngeal Swab     Status: None   Collection Time: 11/25/21 11:42 AM   Specimen: Nasopharyngeal Swab; Nasopharyngeal(NP) swabs in vial transport medium  Result Value Ref Range Status   SARS Coronavirus 2 by RT PCR NEGATIVE NEGATIVE Final    Comment: (NOTE) SARS-CoV-2 target nucleic acids are NOT DETECTED.  The SARS-CoV-2 RNA is generally detectable in upper respiratory specimens during the acute phase of infection. The lowest concentration of SARS-CoV-2 viral copies this assay can detect is 138 copies/mL. A negative result does not preclude SARS-Cov-2 infection and should not be used as the sole basis for treatment or other patient management decisions. A negative result may occur with  improper specimen collection/handling, submission of specimen other than nasopharyngeal swab, presence of viral mutation(s) within the areas targeted by this assay, and inadequate number of viral copies(<138 copies/mL). A negative result must be combined with clinical observations, patient history, and epidemiological information. The expected result is Negative.  Fact Sheet for Patients:  EntrepreneurPulse.com.au  Fact Sheet for Healthcare Providers:  IncredibleEmployment.be  This test is no t yet approved or cleared by the [place] FDA and  has been authorized for detection and/or diagnosis of SARS-CoV-2 by FDA under an Emergency Use Authorization (EUA). This  EUA will remain  in effect (meaning this test can be used) for the duration of the COVID-19 declaration under Section 564(b)(1) of the Act, 21 U.S.C.section 360bbb-3(b)(1), unless the authorization is terminated  or revoked sooner.       Influenza A by PCR NEGATIVE NEGATIVE Final   Influenza B by PCR NEGATIVE NEGATIVE Final    Comment: (NOTE) The Xpert Xpress SARS-CoV-2/FLU/RSV plus assay is intended as an aid in the diagnosis of influenza from Nasopharyngeal swab specimens and should not be used as a sole basis for treatment. Nasal washings and aspirates are unacceptable for Xpert Xpress SARS-CoV-2/FLU/RSV testing.  Fact Sheet for Patients: EntrepreneurPulse.com.au  Fact Sheet for Healthcare Providers: IncredibleEmployment.be  This test is not yet approved or cleared by the [place] FDA and has been authorized for detection and/or diagnosis of SARS-CoV-2 by FDA under an Emergency Use Authorization (EUA). This EUA will remain in effect (meaning this test can be used) for the duration of the COVID-19 declaration under Section 564(b)(1) of the Act, 21 U.S.C. section 360bbb-3(b)(1), unless the authorization is terminated or revoked.  Performed at [facility], [address] [place], [place] [address]     Coagulation Studies: No results for input(s): LABPROT, INR in the last 72 hours.  Urinalysis: Recent Labs    11/25/21 1210  COLORURINE YELLOW*  LABSPEC 1.015  PHURINE 6.0  GLUCOSEU NEGATIVE  HGBUR SMALL*  BILIRUBINUR NEGATIVE  KETONESUR NEGATIVE  PROTEINUR >=300*  NITRITE NEGATIVE  LEUKOCYTESUR NEGATIVE       Imaging: ECHOCARDIOGRAM COMPLETE  Result Date: 11/27/2021    ECHOCARDIOGRAM REPORT   Patient Name:   Derek Cooley. Date of Exam: 11/27/2021 Medical Rec #:  [id number]          Height:       67.0 in Accession #:    [id number]         Weight:       216.9 lb Date of Birth:  [birth year]         BSA:           2.093 m Patient Age:    60 years           BP:           157/100 mmHg Patient Gender: M                  HR:           72 bpm. Exam Location:  [facility] Procedure: 2D Echo, Cardiac Doppler and Color Doppler Indications:     Acute Respiratory distress R06.03  History:         Patient has no prior history of Echocardiogram examinations.                  Risk Factors:Hypertension and Diabetes. Anxiety.  Sonographer:     Sherrie Sport Referring Phys:  [place] Diagnosing Phys: Kathlyn Sacramento  MD IMPRESSIONS  1. Left ventricular ejection fraction, by estimation, is 55 to 60%. The left ventricle has normal function. The left ventricle has no regional wall motion abnormalities. There is moderate left ventricular hypertrophy. Left ventricular diastolic parameters are consistent with Grade I diastolic dysfunction (impaired relaxation).  2. Right ventricular systolic function is normal. The right ventricular size is normal. Tricuspid regurgitation signal is inadequate for assessing PA pressure.  3. Left atrial size was mildly dilated.  4. The mitral valve is normal in structure. No evidence of mitral valve regurgitation. No evidence of mitral stenosis.  5. The aortic valve is normal in structure. Aortic valve regurgitation is not visualized. Aortic valve sclerosis is present, with no evidence of aortic valve stenosis. FINDINGS  Left Ventricle: Left ventricular ejection fraction, by estimation, is 55 to 60%. The left ventricle has normal function. The left ventricle has no regional wall motion abnormalities. The left ventricular internal cavity size was normal in size. There is  moderate left ventricular hypertrophy. Left ventricular diastolic parameters are consistent with Grade I diastolic dysfunction (impaired relaxation). Right Ventricle: The right ventricular size is normal. No increase in right ventricular wall thickness. Right ventricular systolic function is normal. Tricuspid regurgitation signal is inadequate for  assessing PA pressure. Left Atrium: Left atrial size was mildly dilated. Right Atrium: Right atrial size was normal in size. Pericardium: There is no evidence of pericardial effusion. Mitral Valve: The mitral valve is normal in structure. No evidence of mitral valve regurgitation. No evidence of mitral valve stenosis. MV peak gradient, 3.0 mmHg. The mean mitral valve gradient is 1.0 mmHg. Tricuspid Valve: The tricuspid valve is normal in structure. Tricuspid valve regurgitation is not demonstrated. No evidence of tricuspid stenosis. Aortic Valve: The aortic valve is normal in structure. Aortic valve regurgitation is not visualized. Aortic valve sclerosis is present, with no evidence of aortic valve stenosis. Aortic valve mean gradient measures 2.0 mmHg. Aortic valve peak gradient measures 4.0 mmHg. Aortic valve area, by VTI measures 3.34 cm. Pulmonic Valve: The pulmonic valve was normal in structure. Pulmonic valve regurgitation is not visualized. No evidence of pulmonic stenosis. Aorta: The aortic root is normal in size and structure. Venous: The inferior vena cava was not well visualized. IAS/Shunts: No atrial level shunt detected by color flow Doppler.  LEFT VENTRICLE PLAX 2D LVIDd:         4.70 cm   Diastology LVIDs:         3.00 cm   LV e' medial:    4.57 cm/s LV PW:         1.90 cm   LV E/e' medial:  14.4 LV IVS:        1.25 cm   LV e' lateral:   4.13 cm/s LVOT diam:     2.10 cm   LV E/e' lateral: 16.0 LV SV:         53 LV SV Index:   25 LVOT Area:     3.46 cm  RIGHT VENTRICLE RV Basal diam:  3.80 cm RV S prime:     17.20 cm/s TAPSE (M-mode): 4.5 cm LEFT ATRIUM             Index        RIGHT ATRIUM           Index LA diam:        4.10 cm 1.96 cm/m   RA Area:     13.40 cm LA Vol (A2C):   70.6 ml 33.73 ml/m  RA Volume:   31.30 ml  14.95 ml/m LA Vol (A4C):   62.4 ml 29.81 ml/m LA Biplane Vol: 68.2 ml 32.58 ml/m  AORTIC VALVE                    PULMONIC VALVE AV Area (Vmax):    3.29 cm     PV Vmax:         0.85 m/s AV Area (Vmean):   3.20 cm     PV Vmean:       63.600 cm/s AV Area (VTI):     3.34 cm     PV VTI:         0.165 m AV Vmax:           99.55 cm/s   PV Peak grad:   2.9 mmHg AV Vmean:          64.450 cm/s  PV Mean grad:   2.0 mmHg AV VTI:            0.158 m      RVOT Peak grad: 5 mmHg AV Peak Grad:      4.0 mmHg AV Mean Grad:      2.0 mmHg LVOT Vmax:         94.60 cm/s LVOT Vmean:        59.600 cm/s LVOT VTI:          0.152 m LVOT/AV VTI ratio: 0.97  AORTA Ao Root diam: 3.70 cm MITRAL VALVE               TRICUSPID VALVE MV Area (PHT): 3.81 cm    TR Peak grad:   9.4 mmHg MV Area VTI:   2.49 cm    TR Vmax:        153.00 cm/s MV Peak grad:  3.0 mmHg MV Mean grad:  1.0 mmHg    SHUNTS MV Vmax:       0.86 m/s    Systemic VTI:  0.15 m MV Vmean:      51.5 cm/s   Systemic Diam: 2.10 cm MV Decel Time: 199 msec    Pulmonic VTI:  0.213 m MV E velocity: 66.00 cm/s MV A velocity: 87.80 cm/s MV E/A ratio:  0.75 Kathlyn Sacramento MD Electronically signed by Kathlyn Sacramento MD Signature Date/Time: 11/27/2021/9:08:48 AM    Final      Medications:     amLODipine  10 mg Oral Daily   carvedilol  25 mg Oral BID WC   DULoxetine  60 mg Oral BID   enoxaparin (LOVENOX) injection  30 mg Subcutaneous QHS   fenofibrate  54 mg Oral Daily   hydrALAZINE  50 mg Oral Q8H   insulin aspart  0-5 Units Subcutaneous QHS   insulin aspart  0-9 Units Subcutaneous TID WC   lidocaine  1 patch Transdermal Q24H   pantoprazole  40 mg Oral BID AC   topiramate  50 mg Oral BID   traZODone  100 mg Oral QHS   acetaminophen, acetaminophen, hydrALAZINE, hydrOXYzine, ondansetron (ZOFRAN) IV, traMADol  Assessment/ Plan:  Mr. Jwan Hornbaker. is a 55 y.o.  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  Acute kidney injury secondary to uncontrolled hypertension Renal ultrasound negative for obstruction, no IV contrast exposure No acute indication for dialysis at this time.  Protein evidenced in UA.   -Creatinine appears to be at plateau.  -Patient will require close outpatient follow-up to monitor renal function.  We will also monitor pending labs.  -We will schedule outpatient appointment at our clinic in 1 week for follow-up. Avoid nephrotoxic agents and therapies, if possible.  Lab Results  Component Value Date   CREATININE 4.17 (H) 11/28/2021   CREATININE 4.17 (H) 11/26/2021   CREATININE 3.84 (H) 11/26/2021    Intake/Output Summary (Last 24 hours) at 11/28/2021 1033 Last data filed at 11/28/2021 0400 Gross per 24 hour  Intake 0 ml  Output --  Net 0 ml    2.   Hypertension with chronic kidney disease.  Admits to medication noncompliance for previous year.  Home regimen included amlodipine, carvedilol, furosemide, and lisinopril.  Receiving these medications currently.  BP currently 142/95  3. Diabetes mellitus with chronic kidney disease.  Hemoglobin A1c 5.4 on 11/26/2021.  Non-insulin-dependent.  Home regimen included glipizide. Glucose remains elevated      LOS: 3   12/20/202210:33 AM

## 2021-11-29 ENCOUNTER — Telehealth: Payer: Self-pay

## 2021-11-29 NOTE — Telephone Encounter (Signed)
Transition Care Management Follow-up Telephone Call Date of discharge and from where: [facility] 11-28-21 DX: hypertensive urgency How have you been since you were released from the hospital? ok Any questions or concerns? No  Items Reviewed: Did the pt receive and understand the discharge instructions provided? Yes  Medications obtained and verified? Yes  Other? No  Any new allergies since your discharge? No  Dietary orders reviewed? Yes Do you have support at home? Yes   Home Care and Equipment/Supplies: Were home health services ordered? no   Has the agency set up a time to come to the patient's home? not applicable Were any new equipment or medical supplies ordered?  No Were you able to get the supplies/equipment? not applicable   Functional Questionnaire: (I = Independent and D = Dependent) ADLs: I  Bathing/Dressing- I  Meal Prep- I  Eating- I  Maintaining continence- I  Transferring/Ambulation- I  Managing Meds- I  Follow up appointments reviewed:  PCP Hospital f/u appt confirmed? Yes  Scheduled to see Dr Mathis Dad on 12-07-21 @ [facility] f/u appt confirmed? Yes  Scheduled to see Dr Kathlen Mody on 01-03-22 @ 8am. Are transportation arrangements needed? No  If their condition worsens, is the pt aware to call PCP or go to the Emergency Dept.? Yes Was the patient provided with contact information for the PCP's office or ED? Yes Was to pt encouraged to call back with questions or concerns? Yes

## 2021-12-07 ENCOUNTER — Other Ambulatory Visit: Payer: Self-pay

## 2021-12-07 ENCOUNTER — Encounter: Payer: Self-pay | Admitting: Nurse Practitioner

## 2021-12-07 ENCOUNTER — Ambulatory Visit (INDEPENDENT_AMBULATORY_CARE_PROVIDER_SITE_OTHER): Payer: Medicare Other | Admitting: Nurse Practitioner

## 2021-12-07 VITALS — BP 139/70 | HR 75 | Temp 98.3°F

## 2021-12-07 DIAGNOSIS — E1159 Type 2 diabetes mellitus with other circulatory complications: Secondary | ICD-10-CM

## 2021-12-07 DIAGNOSIS — E119 Type 2 diabetes mellitus without complications: Secondary | ICD-10-CM

## 2021-12-07 DIAGNOSIS — J441 Chronic obstructive pulmonary disease with (acute) exacerbation: Secondary | ICD-10-CM

## 2021-12-07 DIAGNOSIS — Z09 Encounter for follow-up examination after completed treatment for conditions other than malignant neoplasm: Secondary | ICD-10-CM | POA: Diagnosis not present

## 2021-12-07 DIAGNOSIS — N182 Chronic kidney disease, stage 2 (mild): Secondary | ICD-10-CM

## 2021-12-07 DIAGNOSIS — R7989 Other specified abnormal findings of blood chemistry: Secondary | ICD-10-CM

## 2021-12-07 DIAGNOSIS — F1910 Other psychoactive substance abuse, uncomplicated: Secondary | ICD-10-CM

## 2021-12-07 DIAGNOSIS — I152 Hypertension secondary to endocrine disorders: Secondary | ICD-10-CM

## 2021-12-07 DIAGNOSIS — I5031 Acute diastolic (congestive) heart failure: Secondary | ICD-10-CM

## 2021-12-07 DIAGNOSIS — S2249XA Multiple fractures of ribs, unspecified side, initial encounter for closed fracture: Secondary | ICD-10-CM | POA: Insufficient documentation

## 2021-12-07 DIAGNOSIS — I169 Hypertensive crisis, unspecified: Secondary | ICD-10-CM | POA: Insufficient documentation

## 2021-12-07 DIAGNOSIS — J810 Acute pulmonary edema: Secondary | ICD-10-CM

## 2021-12-07 DIAGNOSIS — I5033 Acute on chronic diastolic (congestive) heart failure: Secondary | ICD-10-CM | POA: Insufficient documentation

## 2021-12-07 MED ORDER — BUDESONIDE-FORMOTEROL FUMARATE 160-4.5 MCG/ACT IN AERO: 2.0000 | INHALATION_SPRAY | Freq: Two times a day (BID) | RESPIRATORY_TRACT | 12 refills | Status: DC

## 2021-12-07 MED ORDER — TOPIRAMATE 50 MG PO TABS: 50.0000 mg | ORAL_TABLET | Freq: Two times a day (BID) | ORAL | 1 refills | Status: DC

## 2021-12-07 MED ORDER — HYDROXYZINE HCL 50 MG PO TABS: ORAL_TABLET | ORAL | 1 refills | Status: DC

## 2021-12-07 NOTE — Assessment & Plan Note (Signed)
Improving. Will continue to monitor.

## 2021-12-07 NOTE — Assessment & Plan Note (Signed)
Chronic. Still smoking daily. Will add Symbicort to regimen. Continue with PRN use of Albuterol. Follow up in 2 months for reevaluation.

## 2021-12-07 NOTE — Progress Notes (Signed)
BP 139/70    Pulse 75    Temp 98.3 F (36.8 C) (Oral)    SpO2 100%    Subjective:    Patient ID: Derek Cooley., male    DOB: Jan 05, 1966, 55 y.o.   MRN: [id number]  HPI: Derek Cooley. is a 55 y.o. male  Chief Complaint  Patient presents with   Hospitalization Follow-up   Transition of [facility] Follow up.   Hospital/Facility: [facility] D/C Physician: Dr. 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   amLODipine (NORVASC) 10 MG tablet Take 1 tablet (10 mg total) by mouth daily.   budesonide-formoterol (SYMBICORT) 160-4.5 MCG/ACT inhaler Inhale 2 puffs into the lungs in the morning and at bedtime.   carvedilol (COREG) 25 MG tablet Take 1 tablet (25 mg total) by mouth 2 (two) times daily with a meal.   DULoxetine (CYMBALTA) 60 MG capsule Take 1 capsule (60 mg total) by mouth daily.   fenofibrate micronized (LOFIBRA) 67 MG capsule TAKE 1 CAPSULE EVERY DAY BEFORE BREAKFAST   glucose blood (ONETOUCH ULTRA) test strip USE TO TEST BLOOD SUGAR DAILY   pantoprazole (PROTONIX) 40 MG tablet Take 1 tablet (40 mg total) by mouth 2 (two) times daily as needed.   pregabalin (LYRICA) 50 MG capsule Take 1 capsule (50 mg total) by mouth 2 (two) times daily.   traZODone (DESYREL) 100 MG tablet Take 100 mg by mouth at bedtime.   [DISCONTINUED] hydrOXYzine  (ATARAX/VISTARIL) 50 MG tablet TAKE ONE TABLET 3 TIMES DAILY AS NEEDED   [DISCONTINUED] topiramate (TOPAMAX) 50 MG tablet Take 1 tablet (50 mg total) by mouth 2 (two) times daily.   hydrOXYzine (ATARAX) 50 MG tablet TAKE ONE TABLET 3 TIMES DAILY AS NEEDED   topiramate (TOPAMAX) 50 MG tablet Take 1 tablet (50 mg total) by mouth 2 (two) times daily.   No facility-administered encounter medications on file as of 12/07/2021.    Diagnostic Tests Reviewed/Disposition: Reviewed  Consults: Nephrology and Cardiology  Discharge Instructions: Reviewed with patient  Disease/illness Education: Provided   Home Health/Community Services Discussions/Referrals: NA  Establishment or re-establishment of referral orders for community resources: Needs to keep follow up with Cardiology and Nephrology  Discussion with other health care providers: Reviewed consult notes.  Assessment and Support of treatment regimen adherence: Assessed during visit.  Appointments Coordinated with: Patient and Sister  Education for self-management, independent living, and ADLs: Able to perform all ADLs   Malignant hypertension medication noncompliance --12/20--BP much improved. Continue with amlodipine and carvedilol   Flash pulmonary edema with CHF acute Diastolic in the setting of malignant hypertension elevated troponin appears demand ischemia Patient has an appt with Dr. Lovena Le on January 25.  He is back on room air and holding his Sats above 95%.  nondisplaced right fourth fifth sixth rib fracture -- he had fallen prior to admission and that is when  he broke his ribs.   acute on chronic kidney disease stage 3a--now IV, with history of diabetes and hypertension Will recheck CMP and CBC in office today -- nephrology consultation with Dr. Holley Raring on Monday at 1:30 -- renal ultrasound negative for obstruction -- patient will follow-up with Dr. 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 Interim medical history since our last visit reviewed. Allergies and medications reviewed and updated.  Review of Systems  Eyes:  Negative for visual disturbance.  Respiratory:  Positive for shortness of breath.   Cardiovascular:  Negative for chest pain and leg swelling.  Neurological:  Negative for light-headedness and headaches.   Per HPI unless specifically indicated above     Objective:    BP 139/70    Pulse 75    Temp 98.3 F (36.8 C) (Oral)    SpO2 100%   Wt Readings from Last 3 Encounters:  11/28/21 197 lb 12.8 oz (89.7 kg)  05/30/21 217 lb (98.4 kg)  04/17/21 219 lb 2 oz (99.4 kg)    Physical Exam Vitals and nursing note reviewed.  Constitutional:      General: He is not in acute distress.    Appearance: Normal appearance. He is not ill-appearing, toxic-appearing or diaphoretic.  HENT:     Head: Normocephalic.     Right Ear: External ear normal.     Left Ear: External ear normal.     Nose: Nose normal. No congestion or rhinorrhea.     Mouth/Throat:     Mouth: Mucous membranes are moist.  Eyes:     General:        Right eye: No discharge.        Left eye: No discharge.     Extraocular Movements: Extraocular movements intact.     Conjunctiva/sclera: Conjunctivae normal.     Pupils: Pupils are equal, round, and reactive to light.  Cardiovascular:     Rate and Rhythm: Normal rate and regular rhythm.     Heart sounds: No murmur heard. Pulmonary:     Effort: Pulmonary effort is normal. No respiratory distress.     Breath sounds: Wheezing present. No rhonchi or rales.  Abdominal:     General: Abdomen is flat. Bowel sounds are normal.  Musculoskeletal:     Cervical back: Normal range  of motion and neck supple.  Skin:    General: Skin is warm and dry.     Capillary Refill: Capillary refill takes less than 2 seconds.  Neurological:     General: No focal deficit present.     Mental Status: He is alert and oriented to person, place, and time.  Psychiatric:        Mood and Affect: Mood normal.        Behavior: Behavior normal.        Thought Content: Thought content normal.        Judgment: Judgment normal.    Results for orders placed or performed during the hospital encounter of 11/25/21  Resp Panel by RT-PCR (Flu A&B, Covid) Nasopharyngeal Swab   Specimen: Nasopharyngeal Swab; Nasopharyngeal(NP) swabs in vial transport medium  Result Value Ref Range   SARS Coronavirus 2 by RT PCR NEGATIVE NEGATIVE  Influenza A by PCR NEGATIVE NEGATIVE   Influenza B by PCR NEGATIVE NEGATIVE  Basic metabolic panel  Result Value Ref Range   Sodium 140 135 - 145 mmol/L   Potassium 3.9 3.5 - 5.1 mmol/L   Chloride 110 98 - 111 mmol/L   CO2 24 22 - 32 mmol/L   Glucose, Bld 116 (H) 70 - 99 mg/dL   BUN 41 (H) 6 - 20 mg/dL   Creatinine, Ser 3.45 (H) 0.61 - 1.24 mg/dL   Calcium 8.4 (L) 8.9 - 10.3 mg/dL   GFR, Estimated 20 (L) >60 mL/min   Anion gap 6 5 - 15  CBC  Result Value Ref Range   WBC 11.1 (H) 4.0 - 10.5 K/uL   RBC 3.37 (L) 4.22 - 5.81 MIL/uL   Hemoglobin 10.4 (L) 13.0 - 17.0 g/dL   HCT 31.6 (L) 39.0 - 52.0 %   MCV 93.8 80.0 - 100.0 fL   MCH 30.9 26.0 - 34.0 pg   MCHC 32.9 30.0 - 36.0 g/dL   RDW 13.2 11.5 - 15.5 %   Platelets 255 150 - 400 K/uL   nRBC 0.0 0.0 - 0.2 %  Hepatic function panel  Result Value Ref Range   Total Protein 6.9 6.5 - 8.1 g/dL   Albumin 3.3 (L) 3.5 - 5.0 g/dL   AST 42 (H) 15 - 41 U/L   ALT 32 0 - 44 U/L   Alkaline Phosphatase 99 38 - 126 U/L   Total Bilirubin 0.6 0.3 - 1.2 mg/dL   Bilirubin, Direct 0.1 0.0 - 0.2 mg/dL   Indirect Bilirubin 0.5 0.3 - 0.9 mg/dL  Lipase, blood  Result Value Ref Range   Lipase 37 11 - 51 U/L  Urine Drug  Screen, Qualitative ([facility] only)  Result Value Ref Range   Tricyclic, Ur Screen NONE DETECTED NONE DETECTED   Amphetamines, Ur Screen POSITIVE (A) NONE DETECTED   MDMA (Ecstasy)Ur Screen NONE DETECTED NONE DETECTED   Cocaine Metabolite,Ur [place] POSITIVE (A) NONE DETECTED   Opiate, Ur Screen NONE DETECTED NONE DETECTED   Phencyclidine (PCP) Ur S NONE DETECTED NONE DETECTED   Cannabinoid 50 Ng, Ur  NONE DETECTED NONE DETECTED   Barbiturates, Ur Screen NONE DETECTED NONE DETECTED   Benzodiazepine, Ur Scrn NONE DETECTED NONE DETECTED   Methadone Scn, Ur NONE DETECTED NONE DETECTED  Urinalysis, Routine w reflex microscopic  Result Value Ref Range   Color, Urine YELLOW (A) YELLOW   APPearance CLEAR (A) CLEAR   Specific Gravity, Urine 1.015 1.005 - 1.030   pH 6.0 5.0 - 8.0   Glucose, UA NEGATIVE NEGATIVE mg/dL   Hgb urine dipstick SMALL (A) NEGATIVE   Bilirubin Urine NEGATIVE NEGATIVE   Ketones, ur NEGATIVE NEGATIVE mg/dL   Protein, ur >=300 (A) NEGATIVE mg/dL   Nitrite NEGATIVE NEGATIVE   Leukocytes,Ua NEGATIVE NEGATIVE   RBC / HPF 0-5 0 - 5 RBC/hpf   WBC, UA 11-20 0 - 5 WBC/hpf   Bacteria, UA NONE SEEN NONE SEEN   Squamous Epithelial / LPF 0-5 0 - 5  Brain natriuretic peptide  Result Value Ref Range   B Natriuretic Peptide 549.9 (H) 0.0 - 100.0 pg/mL  Basic metabolic panel  Result Value Ref Range   Sodium 141 135 - 145 mmol/L   Potassium 4.0 3.5 - 5.1 mmol/L   Chloride 107 98 - 111 mmol/L   CO2 26 22 - 32 mmol/L   Glucose, Bld 155 (H) 70 - 99 mg/dL   BUN 42 (  H) 6 - 20 mg/dL   Creatinine, Ser 3.84 (H) 0.61 - 1.24 mg/dL   Calcium 8.5 (L) 8.9 - 10.3 mg/dL   GFR, Estimated 18 (L) >60 mL/min   Anion gap 8 5 - 15  Hemoglobin A1c  Result Value Ref Range   Hgb A1c MFr Bld 5.4 4.8 - 5.6 %   Mean Plasma Glucose 108 mg/dL  ANA w/Reflex if Positive  Result Value Ref Range   Anti Nuclear Antibody (ANA) Negative Negative  ANCA Profile  Result Value Ref Range   Anti-MPO Antibodies <0.2  0.0 - 0.9 units   Anti-PR3 Antibodies <0.2 0.0 - 0.9 units   C-ANCA <1:20 Neg:<1:20 titer   P-ANCA <1:20 Neg:<1:20 titer   Atypical P-ANCA titer <1:20 Neg:<1:20 titer  Parathyroid hormone, intact (no Ca)  Result Value Ref Range   PTH 77 (H) 15 - 65 pg/mL  Phosphorus  Result Value Ref Range   Phosphorus 4.5 2.5 - 4.6 mg/dL  Protein / creatinine ratio, urine  Result Value Ref Range   Creatinine, Urine 133 mg/dL   Total Protein, Urine 218 mg/dL   Protein Creatinine Ratio 1.63 (H) 0.00 - 0.15 mg/mg[Cre]  Protein Electro, Random Urine  Result Value Ref Range   Total Protein, Urine 193.3 Not Estab. mg/dL   Albumin ELP, Urine 76.2 %   Alpha-1-Globulin, U 5.6 %   Alpha-2-Globulin, U 3.0 %   Beta Globulin, U 8.6 %   Gamma Globulin, U 6.5 %   M Component, Ur Not Observed Not Observed %   Please Note: Comment   Protein electrophoresis, serum  Result Value Ref Range   Total Protein ELP 5.7 (L) 6.0 - 8.5 g/dL   Albumin ELP 3.0 2.9 - 4.4 g/dL   Alpha-1-Globulin 0.2 0.0 - 0.4 g/dL   Alpha-2-Globulin 0.7 0.4 - 1.0 g/dL   Beta Globulin 0.9 0.7 - 1.3 g/dL   Gamma Globulin 0.9 0.4 - 1.8 g/dL   M-Spike, % Not Observed Not Observed g/dL   SPE Interp. Comment    Comment Comment    Globulin, Total 2.7 2.2 - 3.9 g/dL   A/G Ratio 1.1 0.7 - 1.7  Glomerular basement membrane antibodies  Result Value Ref Range   GBM Ab <0.2 0.0 - 0.9 units  CBC with Differential/Platelet  Result Value Ref Range   WBC 9.9 4.0 - 10.5 K/uL   RBC 3.22 (L) 4.22 - 5.81 MIL/uL   Hemoglobin 10.0 (L) 13.0 - 17.0 g/dL   HCT 29.7 (L) 39.0 - 52.0 %   MCV 92.2 80.0 - 100.0 fL   MCH 31.1 26.0 - 34.0 pg   MCHC 33.7 30.0 - 36.0 g/dL   RDW 13.3 11.5 - 15.5 %   Platelets 279 150 - 400 K/uL   nRBC 0.0 0.0 - 0.2 %   Neutrophils Relative % 64 %   Neutro Abs 6.3 1.7 - 7.7 K/uL   Lymphocytes Relative 25 %   Lymphs Abs 2.4 0.7 - 4.0 K/uL   Monocytes Relative 7 %   Monocytes Absolute 0.7 0.1 - 1.0 K/uL   Eosinophils Relative  3 %   Eosinophils Absolute 0.3 0.0 - 0.5 K/uL   Basophils Relative 1 %   Basophils Absolute 0.1 0.0 - 0.1 K/uL   Immature Granulocytes 0 %   Abs Immature Granulocytes 0.03 0.00 - 0.07 K/uL  Comprehensive metabolic panel  Result Value Ref Range   Sodium 139 135 - 145 mmol/L   Potassium 4.4 3.5 - 5.1 mmol/L  Chloride 105 98 - 111 mmol/L   CO2 28 22 - 32 mmol/L   Glucose, Bld 132 (H) 70 - 99 mg/dL   BUN 46 (H) 6 - 20 mg/dL   Creatinine, Ser 4.17 (H) 0.61 - 1.24 mg/dL   Calcium 8.2 (L) 8.9 - 10.3 mg/dL   Total Protein 6.1 (L) 6.5 - 8.1 g/dL   Albumin 2.9 (L) 3.5 - 5.0 g/dL   AST 19 15 - 41 U/L   ALT 21 0 - 44 U/L   Alkaline Phosphatase 94 38 - 126 U/L   Total Bilirubin 0.7 0.3 - 1.2 mg/dL   GFR, Estimated 16 (L) >60 mL/min   Anion gap 6 5 - 15  Magnesium  Result Value Ref Range   Magnesium 2.0 1.7 - 2.4 mg/dL  Glucose, capillary  Result Value Ref Range   Glucose-Capillary 105 (H) 70 - 99 mg/dL  Glucose, capillary  Result Value Ref Range   Glucose-Capillary 95 70 - 99 mg/dL  Glucose, capillary  Result Value Ref Range   Glucose-Capillary 116 (H) 70 - 99 mg/dL  Glucose, capillary  Result Value Ref Range   Glucose-Capillary 128 (H) 70 - 99 mg/dL  Glucose, capillary  Result Value Ref Range   Glucose-Capillary 132 (H) 70 - 99 mg/dL  Renal function panel  Result Value Ref Range   Sodium 138 135 - 145 mmol/L   Potassium 4.6 3.5 - 5.1 mmol/L   Chloride 107 98 - 111 mmol/L   CO2 24 22 - 32 mmol/L   Glucose, Bld 91 70 - 99 mg/dL   BUN 45 (H) 6 - 20 mg/dL   Creatinine, Ser 4.17 (H) 0.61 - 1.24 mg/dL   Calcium 8.8 (L) 8.9 - 10.3 mg/dL   Phosphorus 4.2 2.5 - 4.6 mg/dL   Albumin 3.1 (L) 3.5 - 5.0 g/dL   GFR, Estimated 16 (L) >60 mL/min   Anion gap 7 5 - 15  CBC  Result Value Ref Range   WBC 9.0 4.0 - 10.5 K/uL   RBC 3.52 (L) 4.22 - 5.81 MIL/uL   Hemoglobin 10.8 (L) 13.0 - 17.0 g/dL   HCT 31.9 (L) 39.0 - 52.0 %   MCV 90.6 80.0 - 100.0 fL   MCH 30.7 26.0 - 34.0 pg   MCHC  33.9 30.0 - 36.0 g/dL   RDW 13.1 11.5 - 15.5 %   Platelets 300 150 - 400 K/uL   nRBC 0.0 0.0 - 0.2 %  Glucose, capillary  Result Value Ref Range   Glucose-Capillary 84 70 - 99 mg/dL  CBG monitoring, ED  Result Value Ref Range   Glucose-Capillary 136 (H) 70 - 99 mg/dL  CBG monitoring, ED  Result Value Ref Range   Glucose-Capillary 129 (H) 70 - 99 mg/dL  CBG monitoring, ED  Result Value Ref Range   Glucose-Capillary 113 (H) 70 - 99 mg/dL  ECHOCARDIOGRAM COMPLETE  Result Value Ref Range   Weight 3,470.92 oz   Height 67 in   BP 157/100 mmHg   Ao pk vel 1.00 m/s   AV Area VTI 3.34 cm2   AR max vel 3.29 cm2   AV Mean grad 2.0 mmHg   AV Peak grad 4.0 mmHg   S' Lateral 3.00 cm   AV Area mean vel 3.20 cm2   Area-P 1/2 3.81 cm2   MV VTI 2.49 cm2  Troponin I (High Sensitivity)  Result Value Ref Range   Troponin I (High Sensitivity) 123 (HH) <18 ng/L  Troponin I (High  Sensitivity)  Result Value Ref Range   Troponin I (High Sensitivity) 108 (HH) <18 ng/L      Assessment & Plan:   Problem List Items Addressed This Visit       Cardiovascular and Mediastinum   Hypertension associated with diabetes ([place])    Chronic.  Controlled.  Continue with current medication regimen on Amlodipine 58m and Carvedilol 232mBID.  Labs ordered today.  Return to clinic in 2 months for reevaluation.  Call sooner if concerns arise.        Relevant Orders   Comp Met (CMET)   [facility]   Has appointment with Cardiology on January 25 for further evaluation and treatment. Continue with Carvedilol.      Relevant Orders   CBC w/Diff   Hypertensive crisis     Respiratory   COPD (chronic obstructive pulmonary disease) (HCC)    Chronic. Still smoking daily. Will add Symbicort to regimen. Continue with PRN use of Albuterol. Follow up in 2 months for reevaluation.      Relevant Medications   budesonide-formoterol (SYMBICORT) 160-4.5 MCG/ACT inhaler   Flash pulmonary  edema (HCC)    Improving. Will continue to monitor.      Relevant Orders   CBC w/Diff     Genitourinary   CKD (chronic kidney disease) stage 2, GFR 60-89 ml/min    Chronic.  Controlled.  Continue with current medication regimen.  Has appointment with Dr. LaHolley Raringn Monday.  Labs ordered today.  Return to clinic in 2 months for reevaluation.  Call sooner if concerns arise.        Relevant Orders   Comp Met (CMET)   CBC w/Diff     Other   Substance abuse ([place]  Relevant Orders   CBC w/Diff   Other Visit Diagnoses     Hospital discharge follow-up    -  Primary   Reviewed discharge instructions. Discussed medications and answered questions. CMP and CBC drawn in office today.    Controlled type 2 diabetes mellitus without complication, without long-term current use of insulin (HCC)       Relevant Orders   Comp Met (CMET)   CBC w/Diff   Elevated PTHrP level       Elevated PTH in the hospital. Will redraw labs to evaluate for hyperparathyroidism.   Relevant Orders   PTH, Intact and Calcium        Follow up plan: Return in about 2 months (around 02/06/2022) for HTN, HLD, DM2 FU.

## 2021-12-07 NOTE — Assessment & Plan Note (Signed)
Chronic.  Controlled.  Continue with current medication regimen.  Has appointment with Dr. Holley Raring on Monday.  Labs ordered today.  Return to clinic in 2 months for reevaluation.  Call sooner if concerns arise.

## 2021-12-07 NOTE — Assessment & Plan Note (Signed)
Has appointment with Cardiology on January 25 for further evaluation and treatment. Continue with Carvedilol.

## 2021-12-07 NOTE — Assessment & Plan Note (Signed)
Chronic.  Controlled.  Continue with current medication regimen on Amlodipine 10mg  and Carvedilol 25mg  BID.  Labs ordered today.  Return to clinic in 2 months for reevaluation.  Call sooner if concerns arise.

## 2021-12-08 LAB — CBC WITH DIFFERENTIAL/PLATELET
Basophils Absolute: 0.1 10*3/uL (ref 0.0–0.2)
Basos: 1 %
EOS (ABSOLUTE): 0.3 10*3/uL (ref 0.0–0.4)
Eos: 3 %
Hematocrit: 33.6 % — ABNORMAL LOW (ref 37.5–51.0)
Hemoglobin: 11.6 g/dL — ABNORMAL LOW (ref 13.0–17.7)
Immature Grans (Abs): 0.1 10*3/uL (ref 0.0–0.1)
Immature Granulocytes: 1 %
Lymphocytes Absolute: 2 10*3/uL (ref 0.7–3.1)
Lymphs: 23 %
MCH: 31.4 pg (ref 26.6–33.0)
MCHC: 34.5 g/dL (ref 31.5–35.7)
MCV: 91 fL (ref 79–97)
Monocytes Absolute: 0.7 10*3/uL (ref 0.1–0.9)
Monocytes: 8 %
Neutrophils Absolute: 5.5 10*3/uL (ref 1.4–7.0)
Neutrophils: 64 %
Platelets: 362 10*3/uL (ref 150–450)
RBC: 3.69 x10E6/uL — ABNORMAL LOW (ref 4.14–5.80)
RDW: 12.9 % (ref 11.6–15.4)
WBC: 8.6 10*3/uL (ref 3.4–10.8)

## 2021-12-08 LAB — COMPREHENSIVE METABOLIC PANEL
ALT: 14 IU/L (ref 0–44)
AST: 14 IU/L (ref 0–40)
Albumin/Globulin Ratio: 1.5 (ref 1.2–2.2)
Albumin: 4 g/dL (ref 3.8–4.9)
Alkaline Phosphatase: 115 IU/L (ref 44–121)
BUN/Creatinine Ratio: 14 (ref 9–20)
BUN: 52 mg/dL — ABNORMAL HIGH (ref 6–24)
Bilirubin Total: <0.2 mg/dL (ref 0.0–1.2)
CO2: 20 mmol/L (ref 20–29)
Calcium: 9.1 mg/dL (ref 8.7–10.2)
Chloride: 110 mmol/L — ABNORMAL HIGH (ref 96–106)
Creatinine, Ser: 3.63 mg/dL — ABNORMAL HIGH (ref 0.76–1.27)
Globulin, Total: 2.7 g/dL (ref 1.5–4.5)
Glucose: 100 mg/dL — ABNORMAL HIGH (ref 70–99)
Potassium: 5.7 mmol/L — ABNORMAL HIGH (ref 3.5–5.2)
Sodium: 143 mmol/L (ref 134–144)
Total Protein: 6.7 g/dL (ref 6.0–8.5)
eGFR: 19 mL/min/{1.73_m2} — ABNORMAL LOW (ref >59)

## 2021-12-08 LAB — PTH, INTACT AND CALCIUM: PTH: 135 pg/mL — ABNORMAL HIGH (ref 15–65)

## 2021-12-08 NOTE — Progress Notes (Signed)
Please let patient know that his lab work is continuing to improve.  His anemia is improving as well as his kidney function. Make sure to keep appt with the nephrologist for next week.  Patient's PTH remains elevated the nephrologist should address this at his visit.

## 2021-12-09 ENCOUNTER — Other Ambulatory Visit: Payer: Self-pay | Admitting: Nurse Practitioner

## 2021-12-09 NOTE — Telephone Encounter (Signed)
last RF 12/07/21 #60 1 RF   Requested Prescriptions  Refused Prescriptions Disp Refills   topiramate (TOPAMAX) 50 MG tablet [Pharmacy Med Name: TOPIRAMATE 50MG  TABLETS] 180 tablet     Sig: TAKE 1 TABLET(50 MG) BY MOUTH TWICE DAILY     Not Delegated - Neurology: Anticonvulsants - topiramate & zonisamide Failed - 12/09/2021  9:23 AM      Failed - This refill cannot be delegated      Failed - Cr in normal range and within 360 days    Creatinine  Date Value Ref Range Status  08/12/2014 1.55 (H) 0.60 - 1.30 mg/dL Final   Creatinine, Ser  Date Value Ref Range Status  12/07/2021 3.63 (H) 0.76 - 1.27 mg/dL Final   Creatinine, Urine  Date Value Ref Range Status  11/26/2021 133 mg/dL Final         Passed - CO2 in normal range and within 360 days    CO2  Date Value Ref Range Status  12/07/2021 20 20 - 29 mmol/L Final   Co2  Date Value Ref Range Status  08/12/2014 27 21 - 32 mmol/L Final   Bicarbonate  Date Value Ref Range Status  12/30/2019 27.9 20.0 - 28.0 mmol/L Final         Passed - Valid encounter within last 12 months    Recent Outpatient Visits          2 days ago Hospital discharge follow-up   [facility] Jon Billings, NP   7 months ago Hypertension associated with diabetes [facility])   [facility] Jon Billings, NP   1 year ago Chronic anxiety   [facility] Volney American, [place]   1 year ago Benign hypertensive renal disease   [facility]ney [employer], [place]   1 year ago Somnolence   [place], Lilia Argue, [place]      Future Appointments            In 3 days  [facility], [place]   In 3 weeks [place], PA-C [place]   In 2 months Jon Billings, NP [employer], [place]

## 2021-12-11 DIAGNOSIS — N179 Acute kidney failure, unspecified: Secondary | ICD-10-CM | POA: Insufficient documentation

## 2021-12-12 ENCOUNTER — Ambulatory Visit: Payer: Medicare Other

## 2021-12-12 ENCOUNTER — Telehealth: Payer: Self-pay | Admitting: Nurse Practitioner

## 2021-12-12 NOTE — Telephone Encounter (Signed)
Copied from [place] [phone]. Topic: General - Other >> Dec 12, 2021  9:38 AM Tessa Lerner A wrote: Reason for CRM: Lexine Baton with [facility] has called to request an order for home health care as well as palliative care for the patient   Please contact further when possible

## 2021-12-12 NOTE — Telephone Encounter (Signed)
Routing to provider. [place] for orders if they will take a verbal?

## 2021-12-12 NOTE — Telephone Encounter (Signed)
Okay to give verbal order.

## 2021-12-13 NOTE — Telephone Encounter (Signed)
Called and LVM for Nikki with [facility] to please return my call.

## 2021-12-14 NOTE — Telephone Encounter (Signed)
Called and LVM asking for Derek Cooley to please return my call.

## 2021-12-14 NOTE — Telephone Encounter (Signed)
Called again and gave verbal orders to [place].

## 2021-12-15 ENCOUNTER — Telehealth: Payer: Self-pay | Admitting: Primary Care

## 2021-12-15 NOTE — Telephone Encounter (Signed)
Palliative Scheduler spoke with patient's sister [name] and has scheduled an In-home Consult for 12/19/21 @ 12:30 PM.

## 2021-12-15 NOTE — Telephone Encounter (Signed)
Appointment time has been changed to 2:30 PM on 12/19/21

## 2021-12-19 ENCOUNTER — Other Ambulatory Visit: Payer: Self-pay

## 2021-12-19 ENCOUNTER — Other Ambulatory Visit: Payer: Medicare Other | Admitting: Primary Care

## 2021-12-19 DIAGNOSIS — Z91148 Patient's other noncompliance with medication regimen for other reason: Secondary | ICD-10-CM

## 2021-12-19 DIAGNOSIS — F1910 Other psychoactive substance abuse, uncomplicated: Secondary | ICD-10-CM

## 2021-12-19 DIAGNOSIS — I129 Hypertensive chronic kidney disease with stage 1 through stage 4 chronic kidney disease, or unspecified chronic kidney disease: Secondary | ICD-10-CM

## 2021-12-19 DIAGNOSIS — J441 Chronic obstructive pulmonary disease with (acute) exacerbation: Secondary | ICD-10-CM

## 2021-12-19 DIAGNOSIS — Z515 Encounter for palliative care: Secondary | ICD-10-CM

## 2021-12-19 DIAGNOSIS — Z9114 Patient's other noncompliance with medication regimen: Secondary | ICD-10-CM

## 2021-12-19 DIAGNOSIS — I10 Essential (primary) hypertension: Secondary | ICD-10-CM

## 2021-12-19 NOTE — Progress Notes (Signed)
[facility] Pall[facility] Care Consult Note Telephone: [phone]  Fax: [phone]   Date of encounter: 12/19/21 2:36 PM PATIENT NAME: Derek Cooley [place] [address]   [phone] (home)  DOB: [birth year] MRN: [id number] PRIMARY CARE PROVIDER:    Jon Billings, NP,  [address] [place] [address] [phone]  REFERRING PROVIDER:   Jon Billings, NP [address] [place],  [place] [address] [phone]  RESPONSIBLE PARTY:   Extended Emergency Contact Information Primary Emergency Contact: [place] Mobile Phone: [phone] Relation: Son Secondary Emergency Contact: Elyse Jarvis Address: [address]          [place], [place] [address] [place] Mobile Phone: [phone] Relation: Sister  I met face to face with patient and family in  home.  Palliative Care was asked to follow this patient by consultation request of  Jon Billings, NP to address advance care planning and complex medical decision making. This is the initial visit.                                     ASSESSMENT AND PLAN / RECOMMENDATIONS:   Advance Care Planning/Goals of Care: Goals include to maximize quality of life and symptom management. Patient/health care surrogate gave his/her permission to discuss.Our advance care planning conversation included a discussion about:    The value and importance of advance care planning  Experiences with loved ones who have been seriously ill or have died  Exploration of personal, cultural or spiritual beliefs that might influence medical decisions  Exploration of goals of care in the event of a sudden injury or illness  Identification of a healthcare agent - Review and updating or creation of an  advance directive document . Decision not to resuscitate or to de-escalate disease focused treatments due to poor prognosis. Discussed QOL and where he'd like to have care. Needs cueing  for care but not ALF appropriate due to no need for assistance with adls. CODE STATUS:  FULL CODE  I completed a MOST form today. The patient and family outlined their wishes for the following treatment decisions:  Cardiopulmonary Resuscitation: Attempt Resuscitation (CPR)  Medical Interventions: Full Scope of Treatment: Use intubation, advanced airway interventions, mechanical ventilation, cardioversion as indicated, medical treatment, IV fluids, etc, also provide comfort measures. Transfer to the hospital if indicated  Antibiotics: Antibiotics if indicated  IV Fluids: IV fluids for a defined trial period  Feeding Tube: Feeding tube for a defined trial period   Symptom Management/Plan:   Renal function:  GFR is 15, discussed dialysis and will consult with nephrology.  Not a candidate for ESRD and memory loss.  Alcoholism:  Discussed AA, discussed transportation to meetings, zoom.   Self neglect: family reports he is not eating on time, not taking meds, and   Hypercalcemia: Has current elevation and will start on sensipar. Still needs to be picked up. Calcium 135.   Med management:Patient has not taken meds today. His bp is up and family endorses this is 'normal'. He takes medication in my presence. I asked them to call EMT for assessment and/or get an in home bp machine for use to monitor.  Needs f/u with cardiology for establishing new med levels.   Compliance:  Family concerned for poor compliance with meds, diet. Pt endorses past behaviors that have compromised his health. He endorses poor diet compliance, we  discussed organ function and the compromised level he is currently exhibiting. He has many triggers for making choices he does not want, including associates who direct him to make decisions that will not result in his best medical management. We discussed ALF but he is not dependent in any adls, and is some times forgetful of his treatments without cueing.  Follow up Palliative  Care Visit: Palliative care will continue to follow for complex medical decision making, advance care planning, and clarification of goals. Return 6-8 weeks or prn.  I spent  95 minutes providing this consultation. More than 50% of the time in this consultation was spent in counseling and care coordination.  PPS: 60%  HOSPICE ELIGIBILITY/DIAGNOSIS: TBD  Chief Complaint: deficits in self care, HTN, hypercalcemia  HISTORY OF PRESENT ILLNESS:  [name] is a 56 y.o. year old male  with hypercalcemia, AAA, ESRD, CAD,  mild cognitive impairment. Presents today after hospitalization, decreased renal function and poor medication compliance.  History obtained from review of EMR, discussion with primary team, and interview with family, facility staff/caregiver and/or Mr. Sippel.  I reviewed available labs, medications, imaging, studies and related documents from the EMR.  Records reviewed and summarized above.   ROS   General: NAD EYES: denies vision changes ENMT: denies dysphagia Cardiovascular: denies chest pain, denies DOE Pulmonary: denies cough, denies increased SOB, daily smoker Abdomen: endorses good appetite, denies constipation, endorses continence of bowel GU: denies dysuria, endorses continence of urine MSK:  denies increased weakness,  no falls reported Skin: denies rashes or wounds Neurological: denies pain, denies insomnia Psych: Endorses positive mood Heme/lymph/immuno: denies bruises, abnormal bleeding  Physical Exam: Current and past weights: 205 lbs reported Constitutional: NAD EYES: anicteric sclera, lids intact, no discharge  ENMT: intact hearing, oral mucous membranes moist CV: S1S2, RRR, no LE edema Pulmonary: LCTA, no increased work of breathing, no cough, room air Abdomen: intake 100%, normo-active BS + 4 quadrants, soft and non tender, no ascites GU: deferred MSK: no sarcopenia, moves all extremities, ambulatory Skin: warm and dry, no rashes or wounds  on visible skin Neuro:  no generalized weakness,  mild  cognitive impairment Psych: anxious affect, A and O x 3 Hem/lymph/immuno: no widespread bruising CURRENT PROBLEM LIST:  Patient Active Problem List   Diagnosis Date Noted   Acute diastolic HF (heart failure) ([place]) 12/07/2021   Broken ribs 12/07/2021   Hypertensive crisis 12/07/2021   Demand ischemia ([place])    Malignant hypertension 11/25/2021   Flash pulmonary edema ([place]) 11/25/2021   Cocaine abuse ([place]) 11/25/2021   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   Nonadherence to medication 07/06/2020   Encounter for screening colonoscopy    AKI (acute kidney injury) ([place]) 08/16/2019   Altered mental status 08/16/2019   Substance abuse ([place]) 08/16/2019   Hypertriglyceridemia 07/06/2019   Statin intolerance 01/02/2019   Acute bronchitis due to other specified organisms 12/01/2018   Hx of aneurysm 10/16/2017   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 GFR 60-89 ([place]) 04/17/2016   COPD (chronic obstructive pulmonary disease) ([place]) 04/17/2016   GERD (gastroesophageal reflux disease) 08/25/2015   High blood cholesterol 05/16/2015   Depression 05/16/2015   Chronic anxiety 05/16/2015   Benign hypertensive renal disease 05/16/2015   Persistent headaches 05/16/2015   CKD (chronic kidney disease) stage 2, GFR 60-89 ml/min 05/16/2015   Tobacco abuse 05/16/2015   Abdominal wall abscess 02/01/2014   Infection and inflammatory reaction due to other internal prosthetic devices, implants and grafts, initial  encounter ([place]) 01/05/2014   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/05/2014   Wound drainage 07/13/2013   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/2013   Obstructive apnea 06/23/2013   Cerebral aneurysm 06/23/2013   Mild cognitive disorder 03/27/2011   Mild cognitive impairment 03/27/2011   Vitreous hemorrhage ([place]) 03/27/2011   Hypertension associated with diabetes ([place]) 03/06/2011   Adiposity 03/06/2011    PAST MEDICAL HISTORY:  Active Ambulatory Problems    Diagnosis Date Noted   High blood cholesterol 05/16/2015   Depression 05/16/2015   Chronic anxiety 05/16/2015   Benign hypertensive renal disease 05/16/2015   Persistent headaches 05/16/2015   CKD (chronic kidney disease) stage 2, GFR 60-89 ml/min 05/16/2015   Tobacco abuse 05/16/2015   GERD (gastroesophageal reflux disease) 08/25/2015   Abdominal wall abscess 02/01/2014   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/2013   Hypertension associated with diabetes ([place]) 03/06/2011   Infection and inflammatory reaction due to other internal prosthetic devices, implants and grafts, initial encounter ([place]) 01/05/2014   Mild cognitive disorder 03/27/2011   Adiposity 03/06/2011   Obstructive apnea 06/23/2013   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 GFR 60-89 ([place]) 04/17/2016   COPD (chronic obstructive pulmonary disease) ([place]) 04/17/2016   Hx of aneurysm 10/16/2017   Acute bronchitis due to other specified organisms 12/01/2018   Statin intolerance 01/02/2019   Hypertriglyceridemia 07/06/2019   AKI (acute kidney injury) ([place]) 08/16/2019   Altered mental status 08/16/2019   Cerebral aneurysm 06/23/2013   Substance abuse ([place]) 08/16/2019   Encounter for screening colonoscopy    Nonadherence to medication 07/06/2020   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[place]) 01/05/2014   Mild cognitive impairment 03/27/2011   Wound drainage 07/13/2013   Vitreous hemorrhage ([place]) 03/27/2011   Malignant hypertension 11/25/2021   Flash pulmonary edema ([place]) 11/25/2021   Cocaine abuse ([place]) 11/25/2021   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   Demand ischemia (H[facility])    Acute diastolic HF (heart failure) ([place]) 12/07/2021   Broken ribs 12/07/2021   Hypertensive crisis 12/07/2021   Resolved Ambulatory Problems    Diagnosis Date Noted   Type 2 diabetes mellitus without complication ([place]) 10/62/6948   Abdominal pain, chronic,  epigastric 05/16/2015   Acute inflammation of the pancreas 03/27/2011   Chronic abdominal pain 06/23/2013   Other long term (current) drug therapy 10/12/2015   General symptom 03/06/2011   Hypersomnia with sleep apnea 09/03/2011   Hemorrhage into subarachnoid space of neuraxis ([place]) 01/26/2010   Current tobacco use 03/06/2011   Type 2 diabetes mellitus ([place]) 07/23/2011   Vitreous hemorrhage ([place]) 03/27/2011   Open wound 07/13/2013   Past Medical History:  Diagnosis Date   Abdominal abscess    Acute pancreatitis    Anxiety    Asthma    Back pain    Diabetes mellitus without complication ([place])    Emphysema of lung ([place])    Hypertension    MRSA (methicillin resistant Staphylococcus aureus)    Obesity    Panic disorder    Perforated bowel ([place]) 2007   Sleep apnea    Subarachnoid hemorrhage ([place]) 2011   Type 2 diabetes mellitus without complication, without long-term current use of insulin ([place]) 04/17/2016   SOCIAL HX:  Social History   Tobacco Use   Smoking status: Every Day    Packs/day: 0.50    Types: Cigarettes    Start date: 10/11/1984   Smokeless tobacco: Never  Substance Use Topics   Alcohol use: Not  Currently   FAMILY HX:  Family History  Problem Relation Age of Onset   Arthritis Mother    Asthma Mother    Diabetes Mother    Heart disease Mother    Hyperlipidemia Mother    Hypertension Mother    Kidney disease Mother    Thyroid disease Mother    Lung disease Mother    Anxiety disorder Mother    Depression Mother    Diabetes Father    Heart disease Father    Depression Father    Anxiety disorder Father    Arthritis Sister    Asthma Sister    Hyperlipidemia Sister    Hypertension Sister    Lung disease Sister    Anxiety disorder Sister    Depression Sister    Hyperlipidemia Brother    Hypertension Brother    Diabetes Sister    Heart disease Sister    Depression Sister    Anxiety disorder Sister    Anxiety disorder Brother    Depression Brother     Heart disease Brother       ALLERGIES:  Allergies  Allergen Reactions   Atorvastatin     Joint Aches - Severe Joint Aches - Severe Joint Aches - Severe   Avelox [Moxifloxacin Hcl In Nacl]     Muscle pain   Buprenorphine     Mouth sores, confusion, shaking   Dilaudid [Hydromorphone Hcl] Hives   Fluoxetine Itching   Levofloxacin Other (See Comments)    Joint Pain   Morphine And Related    Other     Muscle pain   Suboxone [Buprenorphine Hcl-Naloxone Hcl] Other (See Comments)    Rash and confused   Vancomycin     Renal insufficiency     PERTINENT MEDICATIONS:  Outpatient Encounter Medications as of 12/19/2021  Medication Sig   albuterol (VENTOLIN HFA) 108 (90 Base) MCG/ACT inhaler Inhale 1-2 puffs into the lungs every 6 (six) hours as needed for wheezing or shortness of breath.   amLODipine (NORVASC) 10 MG tablet Take 1 tablet (10 mg total) by mouth daily.   budesonide-formoterol (SYMBICORT) 160-4.5 MCG/ACT inhaler Inhale 2 puffs into the lungs in the morning and at bedtime.   carvedilol (COREG) 25 MG tablet Take 1 tablet (25 mg total) by mouth 2 (two) times daily with a meal.   DULoxetine (CYMBALTA) 60 MG capsule Take 1 capsule (60 mg total) by mouth daily.   fenofibrate micronized (LOFIBRA) 67 MG capsule TAKE 1 CAPSULE EVERY DAY BEFORE BREAKFAST   glucose blood (ONETOUCH ULTRA) test strip USE TO TEST BLOOD SUGAR DAILY   hydrOXYzine (ATARAX) 50 MG tablet TAKE ONE TABLET 3 TIMES DAILY AS NEEDED   pantoprazole (PROTONIX) 40 MG tablet Take 1 tablet (40 mg total) by mouth 2 (two) times daily as needed.   pregabalin (LYRICA) 50 MG capsule Take 1 capsule (50 mg total) by mouth 2 (two) times daily.   topiramate (TOPAMAX) 50 MG tablet Take 1 tablet (50 mg total) by mouth 2 (two) times daily.   traZODone (DESYREL) 100 MG tablet Take 100 mg by mouth at bedtime.   No facility-administered encounter medications on file as of 12/19/2021.   Thank you for the opportunity to participate in  the care of Mr. Steadham.  The palliative care team will continue to follow. Please call our office at [phone] if we can be of additional assistance.   Jason Coop, NP , DNP, AGPCNP-BC  COVID-19 PATIENT SCREENING TOOL Asked and negative response unless otherwise  noted:  Have you had symptoms of covid, tested positive or been in contact with someone with symptoms/positive test in the past 5-10 days?

## 2021-12-25 ENCOUNTER — Ambulatory Visit: Payer: Medicare Other

## 2022-01-03 ENCOUNTER — Ambulatory Visit: Payer: Medicare Other | Admitting: Medical

## 2022-01-04 ENCOUNTER — Ambulatory Visit: Payer: Self-pay | Admitting: *Deleted

## 2022-01-04 NOTE — Telephone Encounter (Signed)
Sister [name] Reel calling in.   Pt was triaged by one of our triage nurses and instructed to call 911 earlier today.  Pt is refusing to call 911 and wants an appt.   [name] seeking advice.    Agent put me on hold to transfer the call to me but it never came through and then disconnected.   The [facility] sent a Teams message asking if I got the call because her Salesforce messed up.    I let her know I did not get the call.   It disconnected.   She's going to let Angie, RN that triaged pt earlier know so she can follow up since she knows the situation.    I let her know I'd be glad to help if needed.     Answer Assessment - Initial Assessment Questions 1. REASON FOR CALL or QUESTION: "What is your reason for calling today?" or "How can I best help you?" or "What question do you have that I can help answer?"     See notes  Protocols used: Information Only Call - No Triage-A-AH

## 2022-01-04 NOTE — Telephone Encounter (Addendum)
Patient's sister on DPR , [name], called back and reports patient refused to go to ED for evaluation. Requesting office visit. Called [facility] , no available OV today. Please advise and call patient's daughter who is with patient now at [phone].      Chief Complaint: slurred speech, sleeping more and agitates easily per DPR , sister [name] Reel. Not with patient at this time. Symptoms: slurring speech, sleeping more s/p fall hypoxia, AMS , SAH dx 12/25/21 Frequency: since being [facility] 12/25/[facility] Pertinent Negatives: Patient denies na  Disposition: [x] ED /[] Urgent Care (no appt availability in office) / [] Appointment(In office/virtual)/ []  [place] [facility]/ [] Home Care/ [] Refused Recommended Disposition /[] [place] [facility]/ []  Follow-up with PCP Additional Notes:  Initial call by sister, [name] Reel reports worsening sx. Since discharge from [facility] 12/25/21 visit. Called patient's son that was supposed to be with patient now, no answer. Left message to call clinic for patient status review. Called patient's sister back to advise if worsening sx noted to call 911 / take patient to ED for evaluation now. [name] verbalized understanding . Unsure of disposition.           Reason for Disposition  Difficult to awaken or acting confused (e.g., disoriented, slurred speech)  Answer Assessment - Initial Assessment Questions 1. SYMPTOM: "What is the main symptom you are concerned about?" (e.g., weakness, numbness)     Sister not with patient. C/o paitent is slurring speech, becoming aggitated easily, and sleeping more  2. ONSET: "When did this start?" (minutes, hours, days; while sleeping)     12/25/21 3. LAST NORMAL: "When was the last time you (the patient) were normal (no symptoms)?"     na 4. PATTERN "Does this come and go, or has it been constant since it started?"  "Is it present now?"     na 5. CARDIAC SYMPTOMS: "Have you had any of the following symptoms: chest pain, difficulty  breathing, palpitations?"     na 6. NEUROLOGIC SYMPTOMS: "Have you had any of the following symptoms: headache, dizziness, vision loss, double vision, changes in speech, unsteady on your feet?"     Slurred speech, sleeping more, aggitates easily  7. OTHER SYMPTOMS: "Do you have any other symptoms?"     na 8. PREGNANCY: "Is there any chance you are pregnant?" "When was your last menstrual period?"     na  Protocols used: Neurologic Deficit-A-AH

## 2022-01-04 NOTE — Telephone Encounter (Signed)
Called and advised patient's sister [name] of Karen's message. [name] verbalized understanding.

## 2022-01-04 NOTE — Telephone Encounter (Signed)
Please call patient's sister and let her know that he needs to be seen in the ER.

## 2022-01-05 ENCOUNTER — Emergency Department
Admission: EM | Admit: 2022-01-05 | Discharge: 2022-01-05 | Disposition: A | Discharge status: Home / Self Care | Payer: Medicare Other | Attending: Emergency Medicine | Admitting: Emergency Medicine

## 2022-01-05 ENCOUNTER — Other Ambulatory Visit: Payer: Self-pay

## 2022-01-05 ENCOUNTER — Emergency Department: Payer: Medicare Other

## 2022-01-05 DIAGNOSIS — I672 Cerebral atherosclerosis: Secondary | ICD-10-CM | POA: Diagnosis not present

## 2022-01-05 DIAGNOSIS — M542 Cervicalgia: Secondary | ICD-10-CM | POA: Diagnosis not present

## 2022-01-05 DIAGNOSIS — R519 Headache, unspecified: Secondary | ICD-10-CM

## 2022-01-05 DIAGNOSIS — I6501 Occlusion and stenosis of right vertebral artery: Secondary | ICD-10-CM | POA: Insufficient documentation

## 2022-01-05 DIAGNOSIS — N189 Chronic kidney disease, unspecified: Secondary | ICD-10-CM | POA: Insufficient documentation

## 2022-01-05 DIAGNOSIS — I159 Secondary hypertension, unspecified: Secondary | ICD-10-CM | POA: Diagnosis not present

## 2022-01-05 LAB — BASIC METABOLIC PANEL
Anion gap: 8 (ref 5–15)
BUN: 46 mg/dL — ABNORMAL HIGH (ref 6–20)
CO2: 23 mmol/L (ref 22–32)
Calcium: 8.5 mg/dL — ABNORMAL LOW (ref 8.9–10.3)
Chloride: 111 mmol/L (ref 98–111)
Creatinine, Ser: 3.6 mg/dL — ABNORMAL HIGH (ref 0.61–1.24)
GFR, Estimated: 19 mL/min — ABNORMAL LOW (ref >60)
Glucose, Bld: 101 mg/dL — ABNORMAL HIGH (ref 70–99)
Potassium: 4.2 mmol/L (ref 3.5–5.1)
Sodium: 142 mmol/L (ref 135–145)

## 2022-01-05 LAB — CBC
HCT: 32.7 % — ABNORMAL LOW (ref 39.0–52.0)
Hemoglobin: 10.8 g/dL — ABNORMAL LOW (ref 13.0–17.0)
MCH: 30.6 pg (ref 26.0–34.0)
MCHC: 33 g/dL (ref 30.0–36.0)
MCV: 92.6 fL (ref 80.0–100.0)
Platelets: 298 10*3/uL (ref 150–400)
RBC: 3.53 MIL/uL — ABNORMAL LOW (ref 4.22–5.81)
RDW: 13.1 % (ref 11.5–15.5)
WBC: 9.1 10*3/uL (ref 4.0–10.5)
nRBC: 0 % (ref 0.0–0.2)

## 2022-01-05 LAB — TROPONIN I (HIGH SENSITIVITY): Troponin I (High Sensitivity): 13 ng/L (ref <18)

## 2022-01-05 IMAGING — MR MR HEAD W/O CM
13 series · 44 of 48 positions shown · non-contrast
Comparison: Head CT [DATE]

CLINICAL DATA: Headache, new or worsening. Headache began yesterday
and is acute.



[Series 5: ax dwi_tracew · axial · 3.0mm · 0.65mm/px · z∈[-88,+66]mm · 2 of 48 slices shown]
[im 1/48]
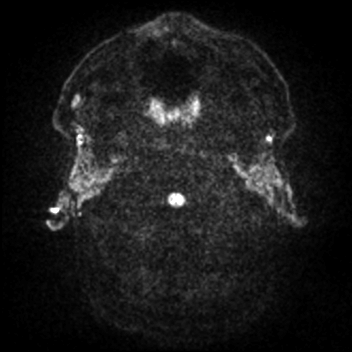
[im 48/48]
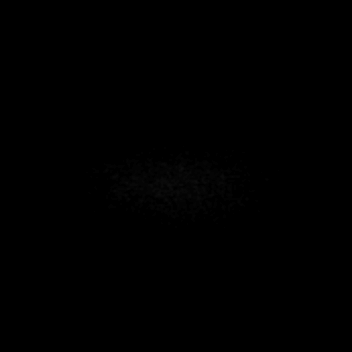

[Series 6: ax dwi_adc · axial · 3.0mm · 0.65mm/px · z∈[-88,+63]mm · 3 of 47 slices shown]
[im 1/47]
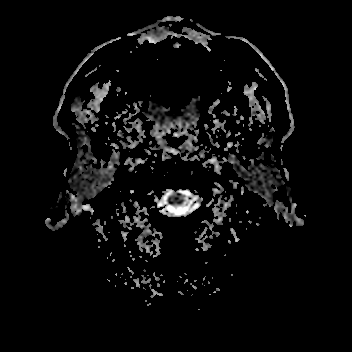
[im 24/47]
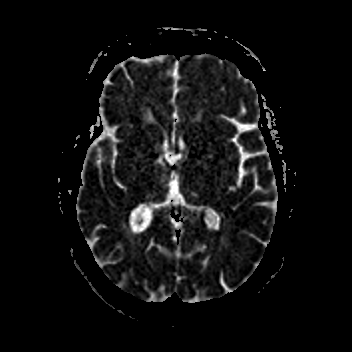
[im 47/47]
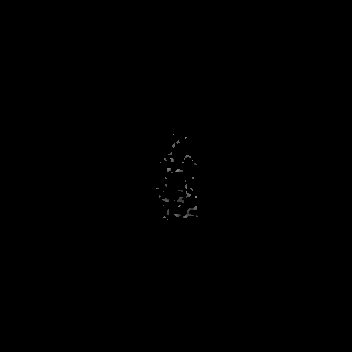

[Series 7: cor dwi_tracew · coronal · 5.0mm · 0.65mm/px · 3 of 40 slices shown]
[im 1/40]
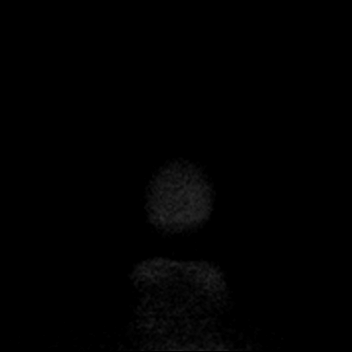
[im 20/40]
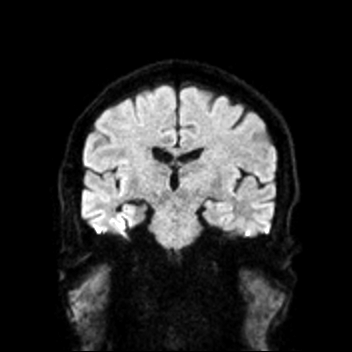
[im 40/40]
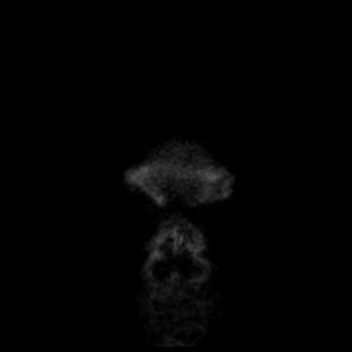

[Series 8: cor dwi_adc · coronal · 5.0mm · 0.65mm/px · 3 of 40 slices shown]
[im 1/40]
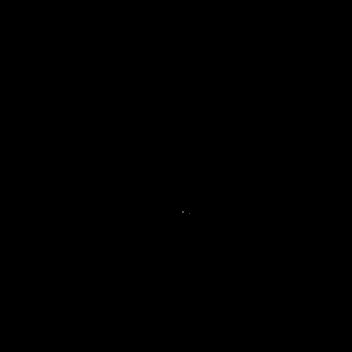
[im 20/40]
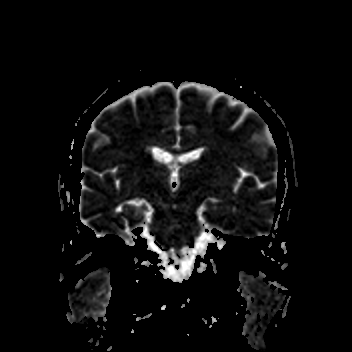
[im 40/40]
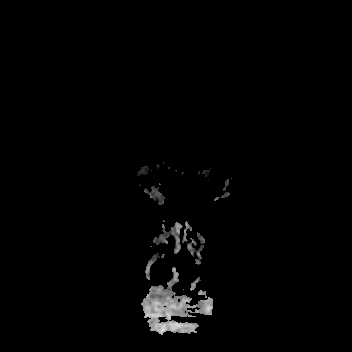

[Series 9: T1 · sagittal · 5.0mm · 0.62mm/px · 2 of 25 slices shown (1 of 2)]
[im 1/25]
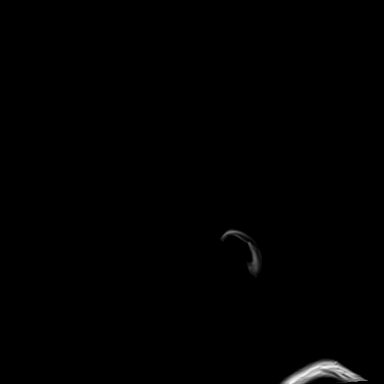
[im 25/25]
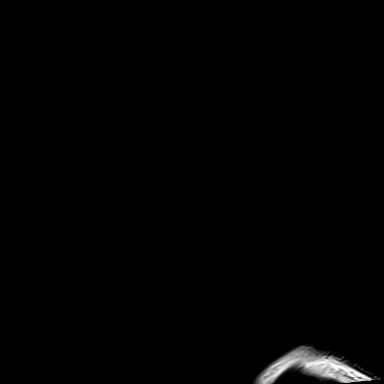

[Series 10: T2 · axial · 5.0mm · 0.53mm/px · z∈[-83,+60]mm · 2 of 25 slices shown (1 of 2)]
[im 1/25]
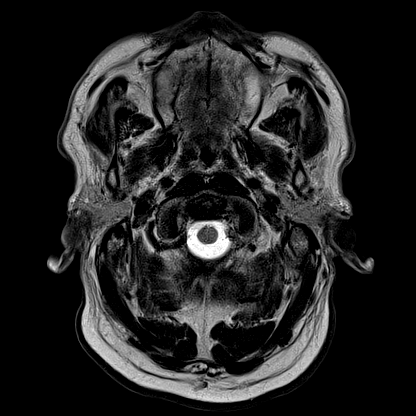
[im 25/25]
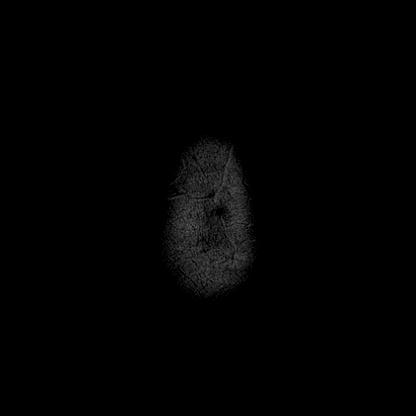

[Series 11: mag_images · axial · 3.0mm · 0.90mm/px · z∈[-100,+77]mm · 4 of 60 slices shown]
[im 1/60]
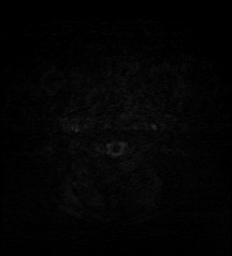
[im 20/60]
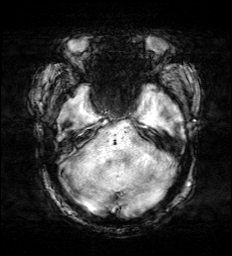
[im 40/60]
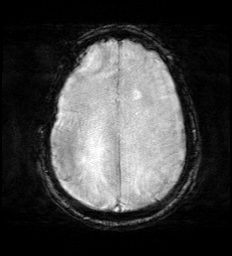
[im 60/60]
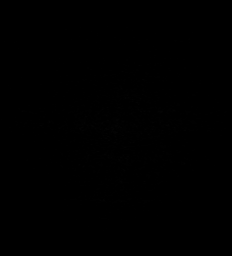

[Series 12: pha_images · axial · 3.0mm · 0.90mm/px · z∈[-88,+77]mm · 3 of 50 slices shown]
[im 1/50]
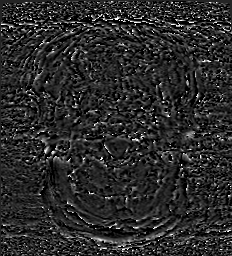
[im 25/50]
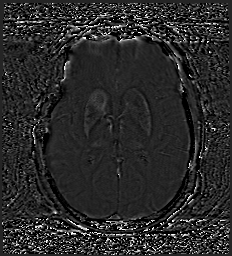
[im 50/50]
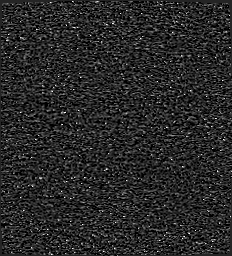

[Series 13: swi_images · axial · 3.0mm · 0.90mm/px · z∈[-100,+77]mm · 4 of 60 slices shown]
[im 1/60]
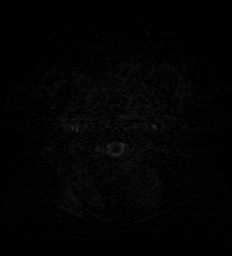
[im 20/60]
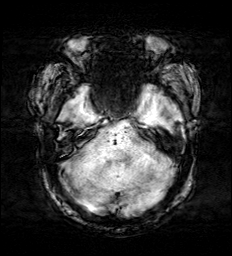
[im 40/60]
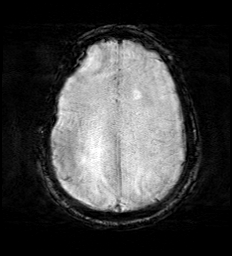
[im 60/60]
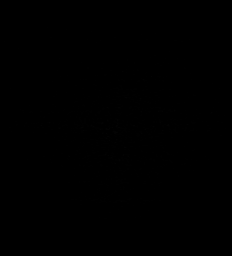

[Series 14: mip_images(sw) · axial · 24.0mm · 0.90mm/px · z∈[-89,+66]mm · 4 of 53 slices shown]
[im 1/53]
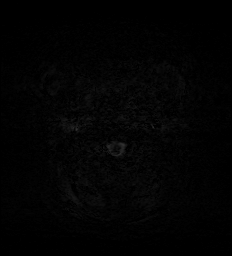
[im 18/53]
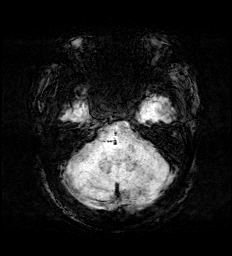
[im 35/53]
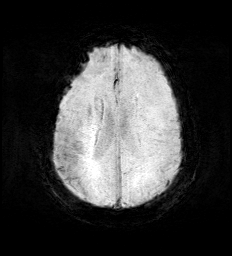
[im 53/53]
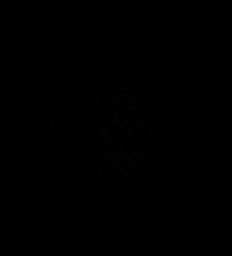

[Series 15: FLAIR · axial · 3.0mm · 0.53mm/px · z∈[-92,+69]mm · 4 of 55 slices shown]
[im 1/55]
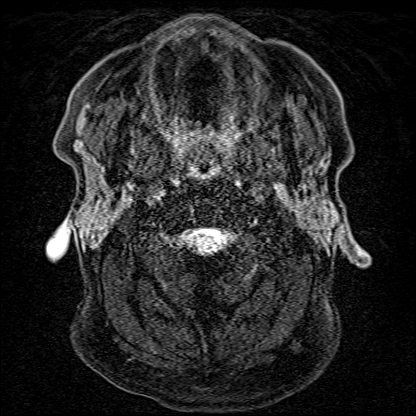
[im 19/55]
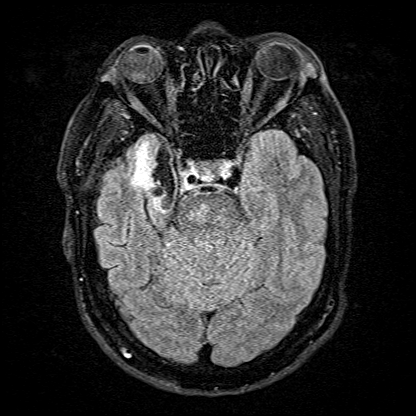
[im 37/55]
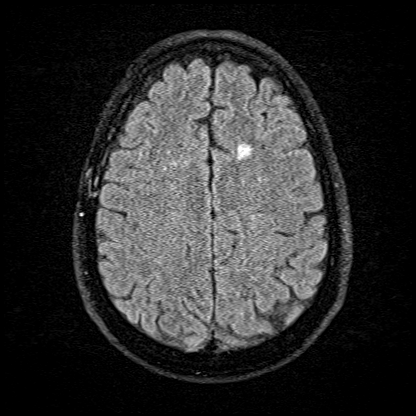
[im 55/55]
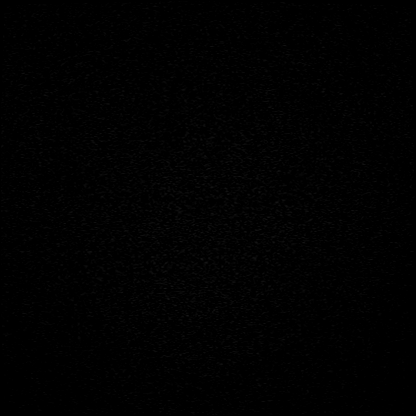

[Series 16: T1 · axial · 1.0mm · 0.98mm/px · z∈[-98,+77]mm · 8 of 176 slices shown (2 of 2)]
[im 1/176]
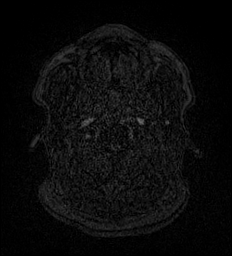
[im 32/176]
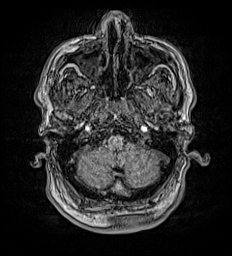
[im 48/176]
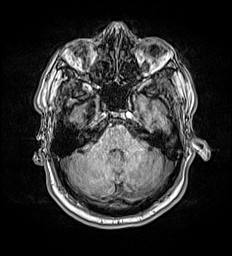
[im 80/176]
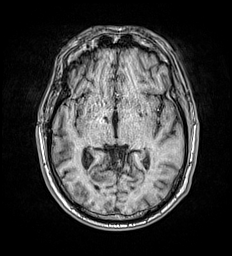
[im 96/176]
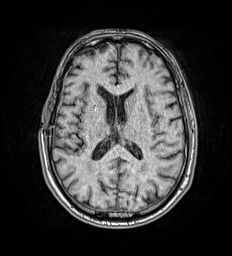
[im 128/176]
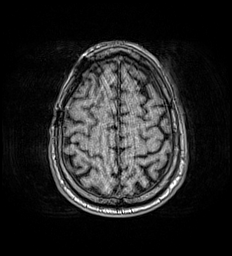
[im 144/176]
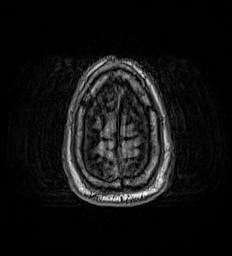
[im 176/176]
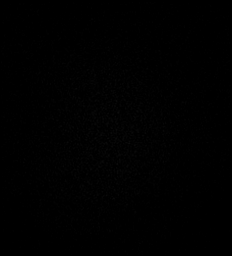

[Series 17: T2 · coronal · 5.0mm · 0.57mm/px · 2 of 29 slices shown (2 of 2)]
[im 1/29]
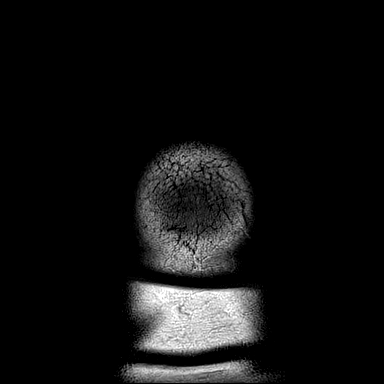
[im 29/29]
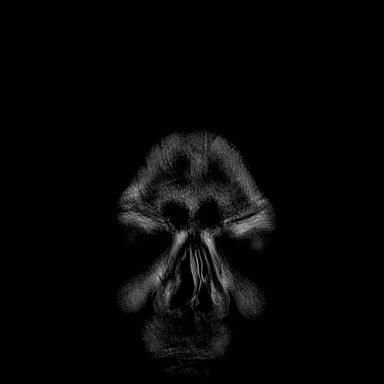

[44 of 48 positions shown; findings below may reference images not displayed]

FINDINGS: MRI HEAD FINDINGS

Brain: Encephalomalacia and gliosis in the anteromedial right
temporal lobe along the operative approach. Sequela of remote left
frontal ventriculostomy. Chronic lacune with blood products at the
right caudothalamic groove. Chronic microhemorrhages in the pons
from uncertain insult. No acute infarct, hydrocephalus, mass, or
collection. No gross subarachnoid hemorrhage on FLAIR imaging.
Artifact from aneurysm clipping and coiling along the right aspect
of the circle-of-Willis

Vascular: Preserved major flow voids.

Skull and upper cervical spine: Unremarkable right pterional
craniotomy

Sinuses/Orbits: Mucosal thickening in the maxillary sinuses with
fluid levels, also seen on prior.

MRA HEAD FINDINGS

Degraded by motion. Artifact along the right ICA paraclinoid segment
related to aneurysm clipping and coiling. There is associated
suppression of the flow related signal in the distal right ICA and
right M1 segment, but robust downstream flow suggesting good patency
of these vessels. No preoperative comparison; no visible recurrence.
No evidence of second aneurysm. Fetal type left PCA flow. Moderate
narrowing at the left PCA bifurcation and right vertebral artery
just beyond the PICA origin. Atheromatous irregularity the bilateral
MCA branches and ACA branches, likely accentuated by motion.

MRA NECK FINDINGS

Very limited noncontrast study with extensive motion. There is
antegrade flow in the covered carotid and vertebral arteries. Both
vertebral origins and distal segments are not covered. Nonvisualized
distal left vertebral artery could easily be artifact given the scan
quality and small vessel size.
IMPRESSION: Brain MRI:

1. No acute intracranial finding.
2. Sequela of prior aneurysm treatment on the right. In the setting
of acute headache, recommend noncontrast head CT which has better
sensitivity for acute subarachnoid hemorrhage.
3. Bilateral maxillary sinusitis.

Intracranial  MRA:

1. Right supraclinoid ICA region aneurysm coiling and clipping with
artifact obscuring signal in adjacent vessels. No evidence of
recurrence or additional aneurysm.
2. Motion degraded.
3. Atherosclerosis including a moderate right vertebral stenosis.

Neck MRA:

Motion degraded with limited coverage to the degree of limited
utility. Antegrade flow in the bilateral carotid and vertebral
arteries.

## 2022-01-05 IMAGING — MR MR MRA HEAD W/O CM
1 series · 18 of 48 positions shown · non-contrast
Comparison: Head CT [DATE]

CLINICAL DATA: Headache, new or worsening. Headache began yesterday
and is acute.



[Series 5: TOF · axial · 0.5mm · 0.41mm/px · z∈[-96,+1]mm · 18 of 205 slices shown]
[im 1/205]
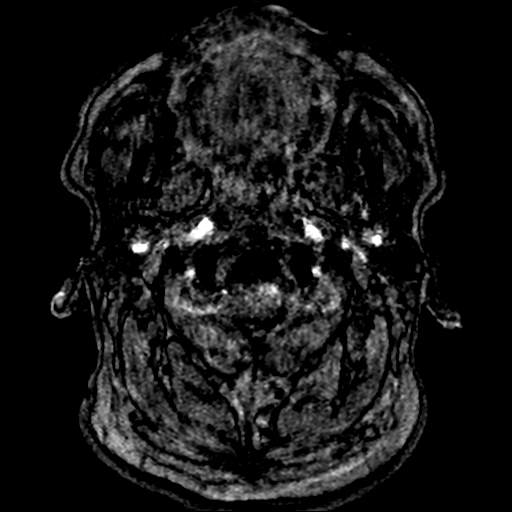
[im 5/205]
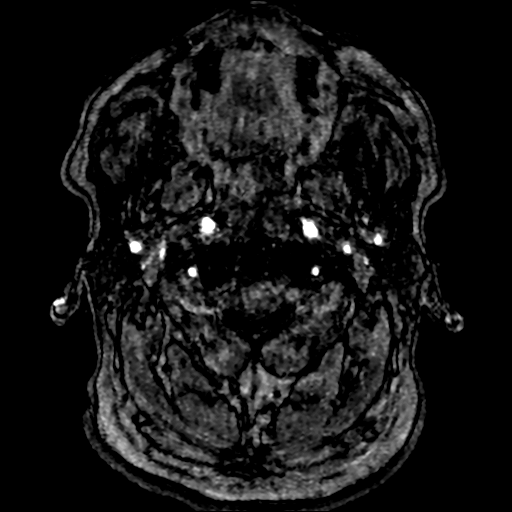
[im 9/205]
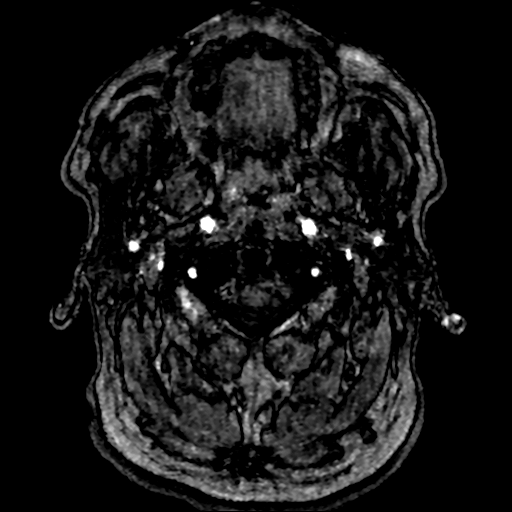
[im 14/205]
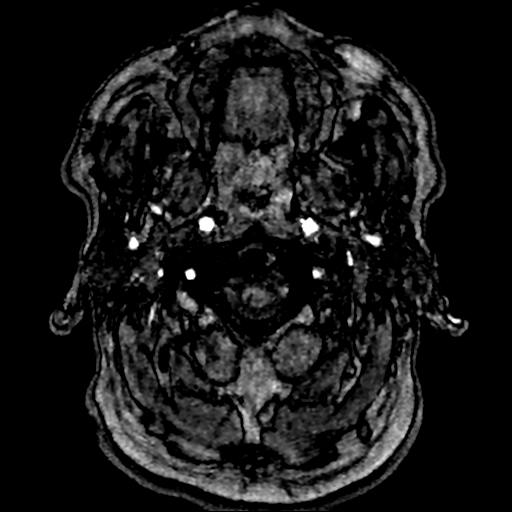
[im 18/205]
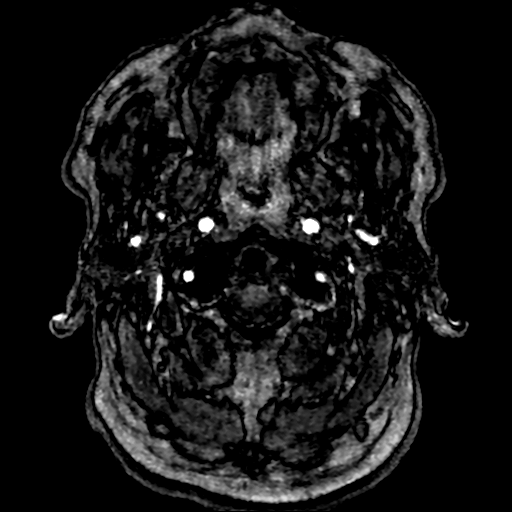
[im 22/205]
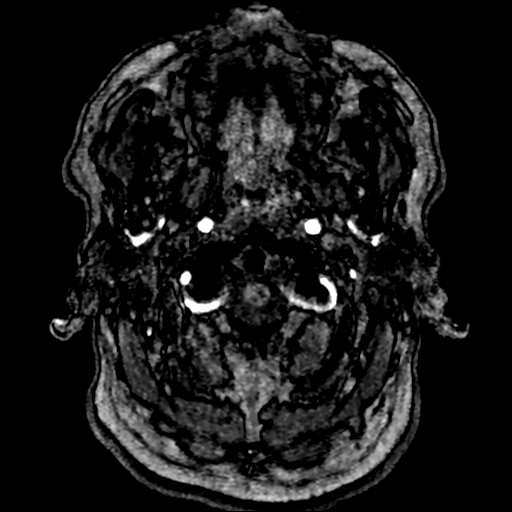
[im 27/205]
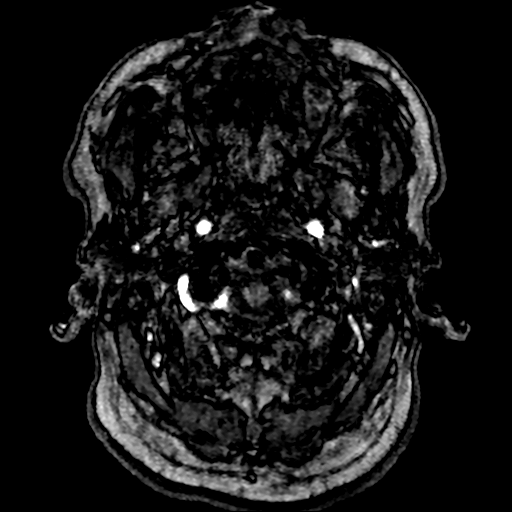
[im 31/205]
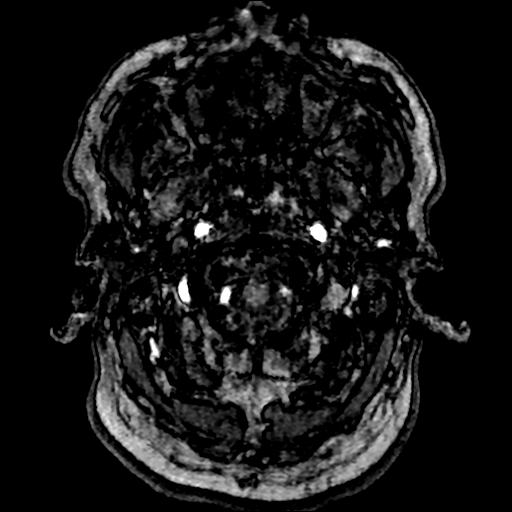
[im 35/205]
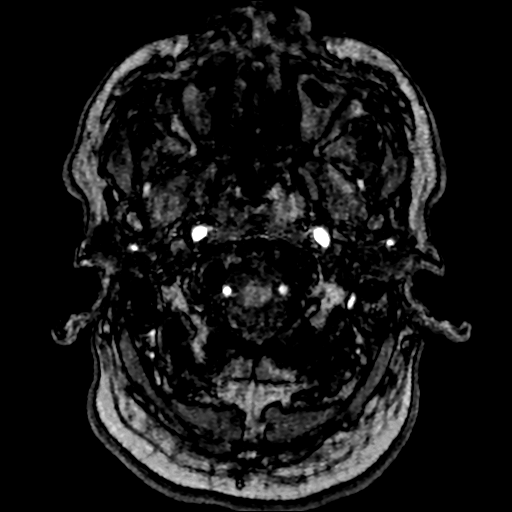
[im 40/205]
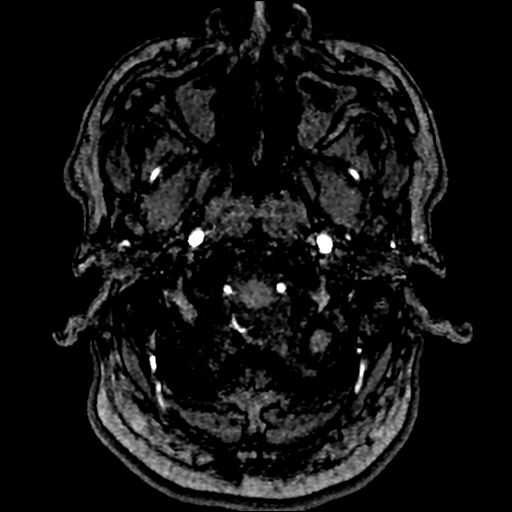
[im 66/205]
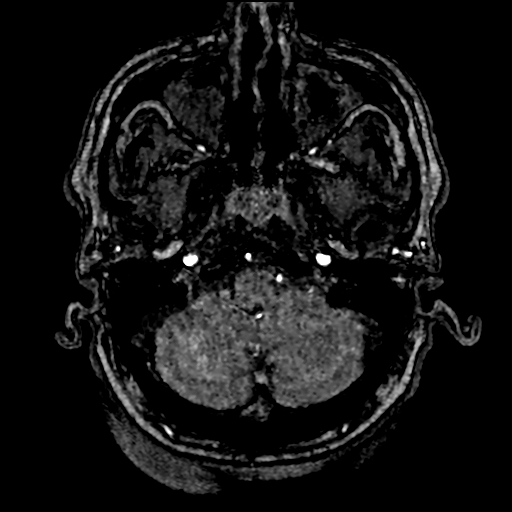
[im 92/205]
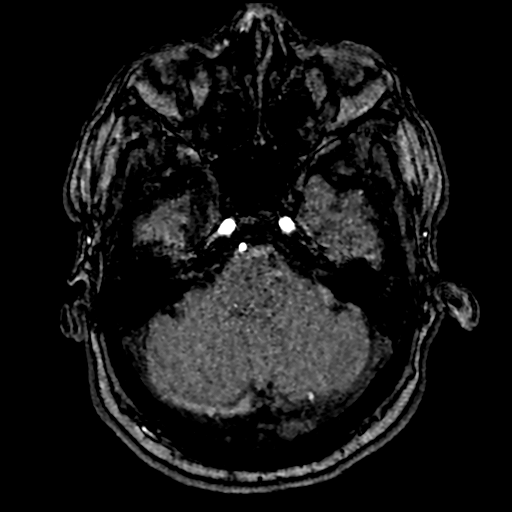
[im 105/205]
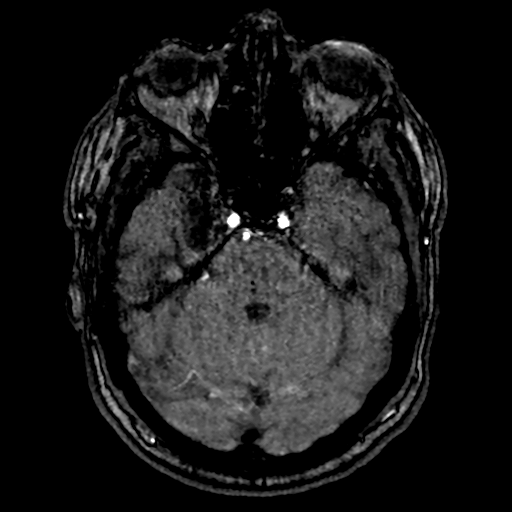
[im 118/205]
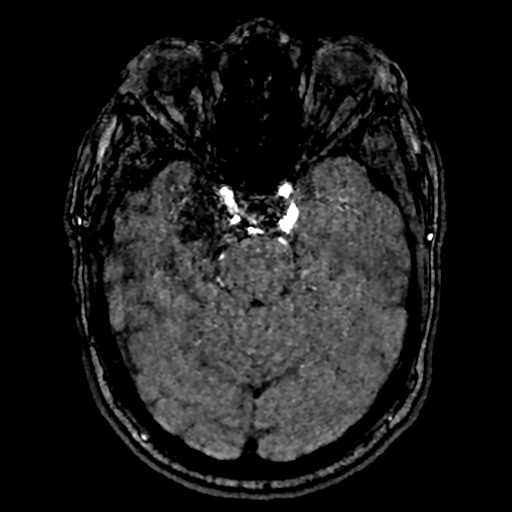
[im 144/205]
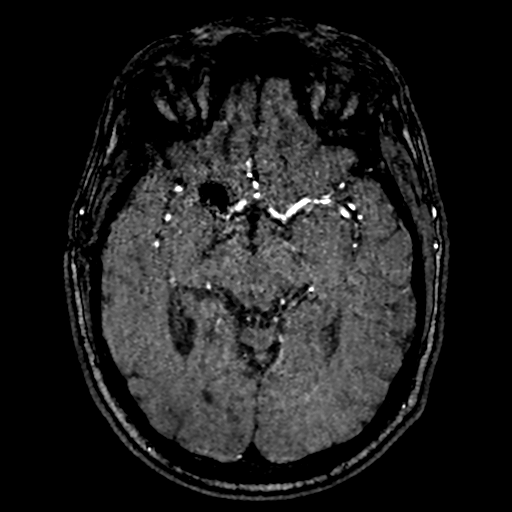
[im 170/205]
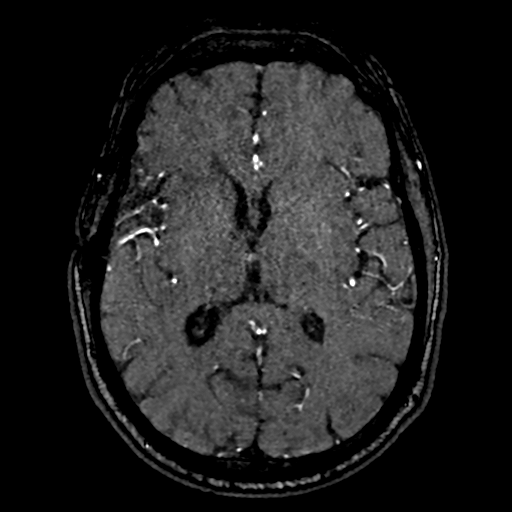
[im 174/205]
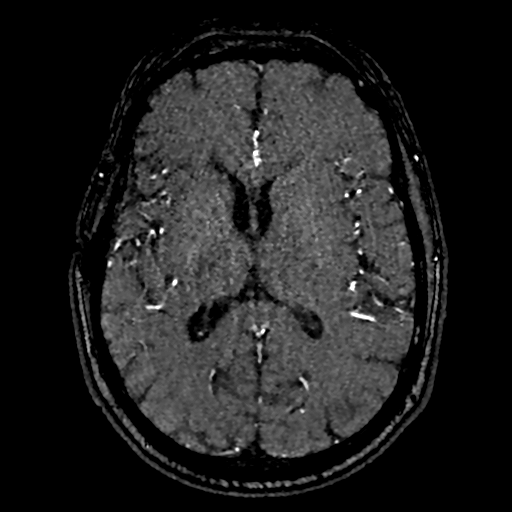
[im 196/205]
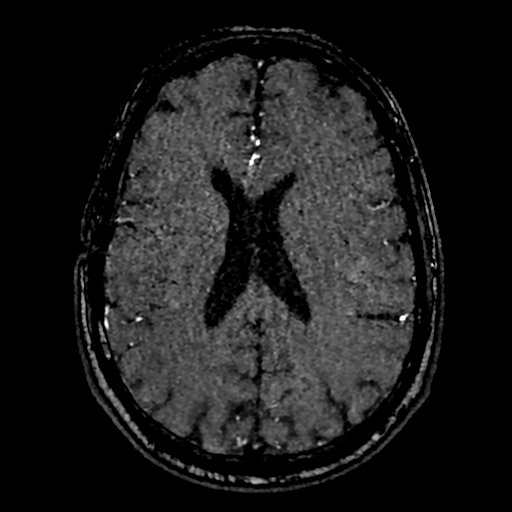

[18 of 48 positions shown; findings below may reference images not displayed]

FINDINGS: MRI HEAD FINDINGS

Brain: Encephalomalacia and gliosis in the anteromedial right
temporal lobe along the operative approach. Sequela of remote left
frontal ventriculostomy. Chronic lacune with blood products at the
right caudothalamic groove. Chronic microhemorrhages in the pons
from uncertain insult. No acute infarct, hydrocephalus, mass, or
collection. No gross subarachnoid hemorrhage on FLAIR imaging.
Artifact from aneurysm clipping and coiling along the right aspect
of the circle-of-Willis

Vascular: Preserved major flow voids.

Skull and upper cervical spine: Unremarkable right pterional
craniotomy

Sinuses/Orbits: Mucosal thickening in the maxillary sinuses with
fluid levels, also seen on prior.

MRA HEAD FINDINGS

Degraded by motion. Artifact along the right ICA paraclinoid segment
related to aneurysm clipping and coiling. There is associated
suppression of the flow related signal in the distal right ICA and
right M1 segment, but robust downstream flow suggesting good patency
of these vessels. No preoperative comparison; no visible recurrence.
No evidence of second aneurysm. Fetal type left PCA flow. Moderate
narrowing at the left PCA bifurcation and right vertebral artery
just beyond the PICA origin. Atheromatous irregularity the bilateral
MCA branches and ACA branches, likely accentuated by motion.

MRA NECK FINDINGS

Very limited noncontrast study with extensive motion. There is
antegrade flow in the covered carotid and vertebral arteries. Both
vertebral origins and distal segments are not covered. Nonvisualized
distal left vertebral artery could easily be artifact given the scan
quality and small vessel size.
IMPRESSION: Brain MRI:

1. No acute intracranial finding.
2. Sequela of prior aneurysm treatment on the right. In the setting
of acute headache, recommend noncontrast head CT which has better
sensitivity for acute subarachnoid hemorrhage.
3. Bilateral maxillary sinusitis.

Intracranial  MRA:

1. Right supraclinoid ICA region aneurysm coiling and clipping with
artifact obscuring signal in adjacent vessels. No evidence of
recurrence or additional aneurysm.
2. Motion degraded.
3. Atherosclerosis including a moderate right vertebral stenosis.

Neck MRA:

Motion degraded with limited coverage to the degree of limited
utility. Antegrade flow in the bilateral carotid and vertebral
arteries.

## 2022-01-05 IMAGING — MR MR MRA NECK W/O CM
2 of 3 series · 10 of 48 positions shown · non-contrast
Comparison: Head CT [DATE]

CLINICAL DATA: Headache, new or worsening. Headache began yesterday
and is acute.



[Series 1076: rcca · 0.43mm/px · 5 of 19 slices shown]
[im 1/19]
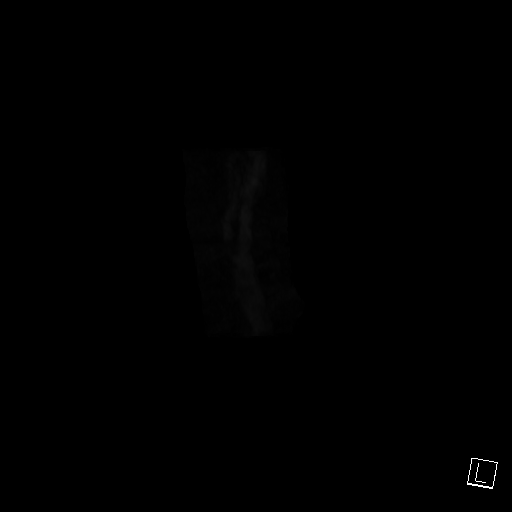
[im 5/19]
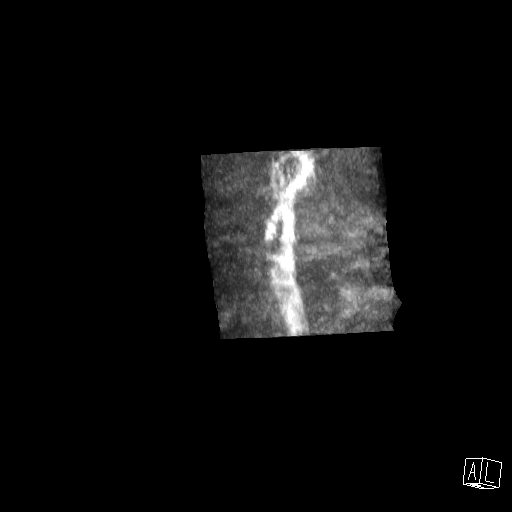
[im 10/19]
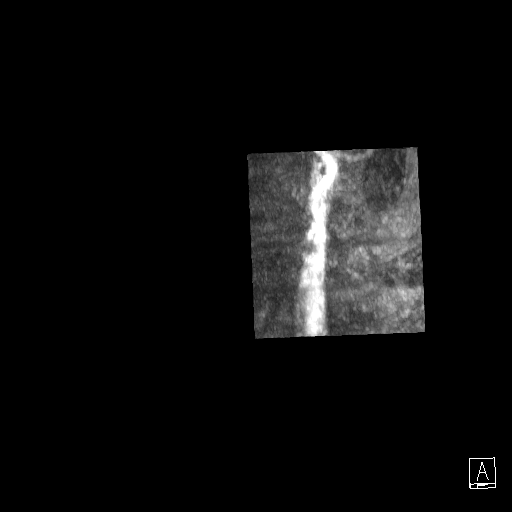
[im 14/19]
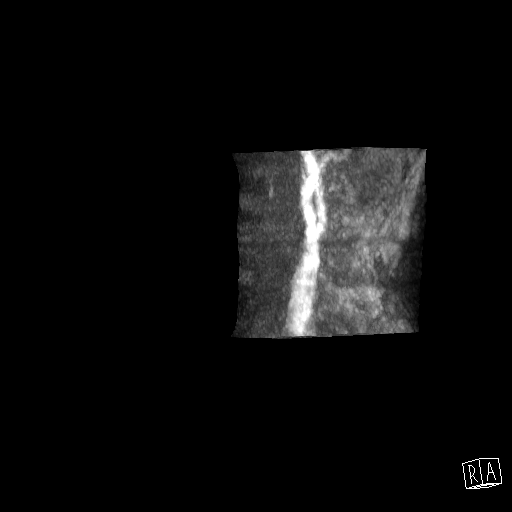
[im 19/19]
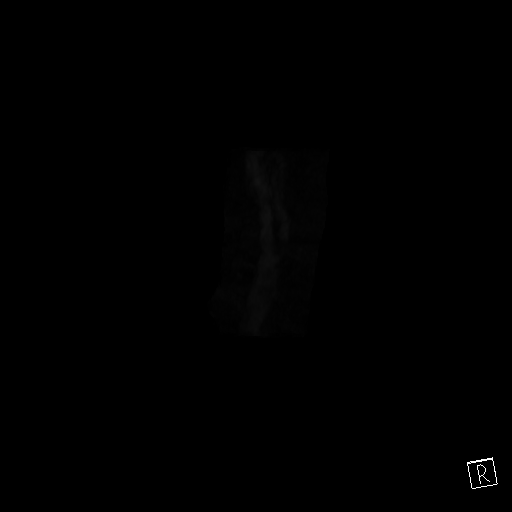

[Series 1080: lcca · 0.43mm/px · 5 of 19 slices shown]
[im 1/19]
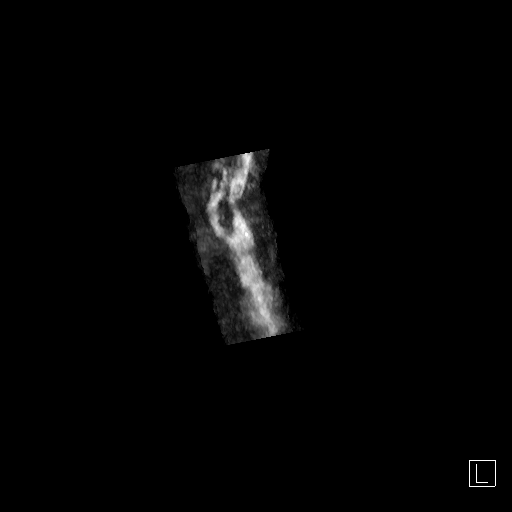
[im 5/19]
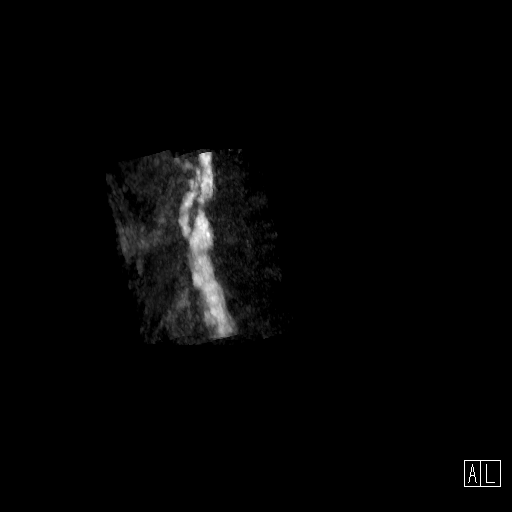
[im 10/19]
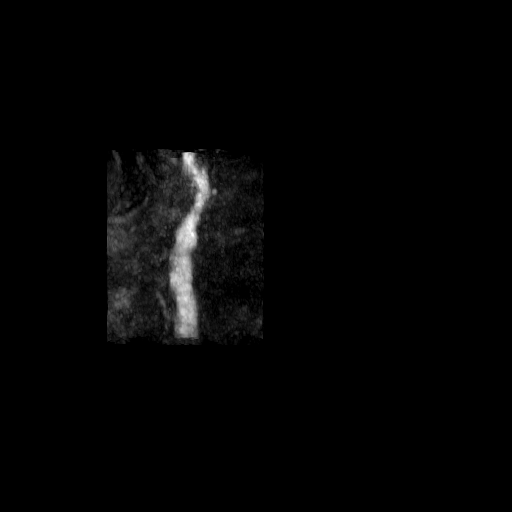
[im 14/19]
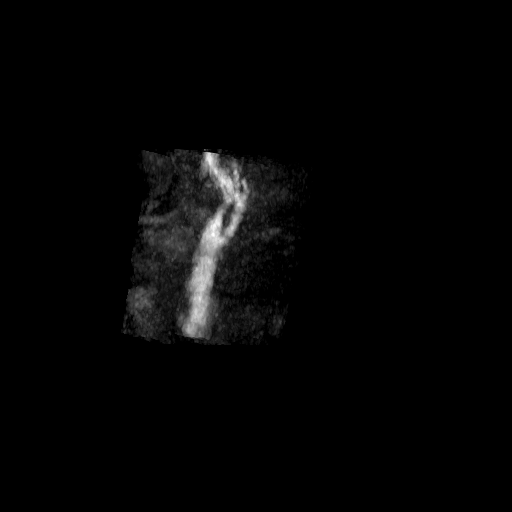
[im 19/19]
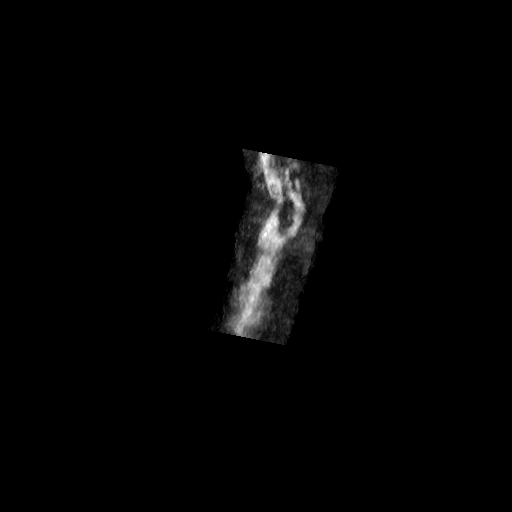

[10 of 48 positions shown; findings below may reference images not displayed]

FINDINGS: MRI HEAD FINDINGS

Brain: Encephalomalacia and gliosis in the anteromedial right
temporal lobe along the operative approach. Sequela of remote left
frontal ventriculostomy. Chronic lacune with blood products at the
right caudothalamic groove. Chronic microhemorrhages in the pons
from uncertain insult. No acute infarct, hydrocephalus, mass, or
collection. No gross subarachnoid hemorrhage on FLAIR imaging.
Artifact from aneurysm clipping and coiling along the right aspect
of the circle-of-Willis

Vascular: Preserved major flow voids.

Skull and upper cervical spine: Unremarkable right pterional
craniotomy

Sinuses/Orbits: Mucosal thickening in the maxillary sinuses with
fluid levels, also seen on prior.

MRA HEAD FINDINGS

Degraded by motion. Artifact along the right ICA paraclinoid segment
related to aneurysm clipping and coiling. There is associated
suppression of the flow related signal in the distal right ICA and
right M1 segment, but robust downstream flow suggesting good patency
of these vessels. No preoperative comparison; no visible recurrence.
No evidence of second aneurysm. Fetal type left PCA flow. Moderate
narrowing at the left PCA bifurcation and right vertebral artery
just beyond the PICA origin. Atheromatous irregularity the bilateral
MCA branches and ACA branches, likely accentuated by motion.

MRA NECK FINDINGS

Very limited noncontrast study with extensive motion. There is
antegrade flow in the covered carotid and vertebral arteries. Both
vertebral origins and distal segments are not covered. Nonvisualized
distal left vertebral artery could easily be artifact given the scan
quality and small vessel size.
IMPRESSION: Brain MRI:

1. No acute intracranial finding.
2. Sequela of prior aneurysm treatment on the right. In the setting
of acute headache, recommend noncontrast head CT which has better
sensitivity for acute subarachnoid hemorrhage.
3. Bilateral maxillary sinusitis.

Intracranial  MRA:

1. Right supraclinoid ICA region aneurysm coiling and clipping with
artifact obscuring signal in adjacent vessels. No evidence of
recurrence or additional aneurysm.
2. Motion degraded.
3. Atherosclerosis including a moderate right vertebral stenosis.

Neck MRA:

Motion degraded with limited coverage to the degree of limited
utility. Antegrade flow in the bilateral carotid and vertebral
arteries.

## 2022-01-05 MED ORDER — ONDANSETRON HCL 4 MG/2ML IJ SOLN
4.0000 mg | Freq: Once | INTRAMUSCULAR | Status: AC
  Administered 2022-01-05: 4 mg via INTRAVENOUS
  Filled 2022-01-05: qty 2

## 2022-01-05 MED ORDER — FENTANYL CITRATE PF 50 MCG/ML IJ SOSY
50.0000 ug | PREFILLED_SYRINGE | Freq: Once | INTRAMUSCULAR | Status: AC
  Administered 2022-01-05: 50 ug via INTRAVENOUS
  Filled 2022-01-05: qty 1

## 2022-01-05 MED ORDER — LORAZEPAM 2 MG/ML IJ SOLN: 1.0000 mg | Freq: Once | INTRAMUSCULAR | Status: DC | PRN

## 2022-01-05 NOTE — Discharge Instructions (Signed)
Your lab tests and MRI/MRA of the brain are all okay today.  We do not see any bleeding or other complications.  Continue taking your blood pressure medicine as prescribed, and follow-up with your doctor on Monday for reassessment of your blood pressure medication regimen.

## 2022-01-05 NOTE — ED Notes (Signed)
Pt states HA started yesterday and was acute onset.  Pt has hx aneurysm with coiling. R head.

## 2022-01-05 NOTE — ED Triage Notes (Signed)
Pt to ED from home, [facility]. Pt has been taking Bps at home and getting 200s/100s  200/120 initial EMS BP  186/96 en route Knee, joint pain (hx arthritis) Blurred vision (chronic), hx aneurysm Hx CKD CBG 109  Last BP med change 1 months ago Had nosebleed yesterday Alert and oriented. Endorses 9/10 HA and no acute vision changes

## 2022-01-05 NOTE — ED Notes (Signed)
EDP at bedside  

## 2022-01-05 NOTE — ED Notes (Signed)
Follow up pcp info provided all questions answered ,

## 2022-01-05 NOTE — ED Notes (Signed)
Pt spoke with MRI screener on RN phone.

## 2022-01-05 NOTE — ED Provider Notes (Signed)
[facility] Provider Note    Event Date/Time   First MD Initiated Contact with Patient 01/05/22 [id number]     (approximate)   History   Hypertension   HPI  Derek Cooley. is a 56 y.o. male with a past history of hypertension CKD GERD and prior cerebral aneurysm status post coiling who comes ED complaining of severe headache that started yesterday, currently 9/10, diffuse, nonradiating, associated with left-sided neck pain as well.  Pain is not thunderclap, but is gradual onset constant and worsened.  Denies trauma vision changes paresthesias or motor weakness.  No difficulty with language or balance.  Patient notes the blood pressure was about 200/100 since yesterday and he had some nosebleed overnight.  Reports that he does take his blood pressure medications every morning although sometimes he forgets a dose.  He did take them this morning.     Physical Exam   Triage Vital Signs: ED Triage Vitals  Enc Vitals Group     BP 01/05/22 0913 (!) 156/110     Pulse Rate 01/05/22 0913 74     Resp 01/05/22 0913 16     Temp 01/05/22 0923 98.4 F (36.9 C)     Temp Source 01/05/22 0923 Oral     SpO2 01/05/22 0913 99 %     Weight 01/05/22 0914 206 lb (93.4 kg)     Height 01/05/22 0914 5\' 7"  (1.702 m)     Head Circumference --      Peak Flow --      Pain Score 01/05/22 0914 9     Pain Loc --      Pain Edu? --      Excl. in [place]? --     Most recent vital signs: Vitals:   01/05/22 1000 01/05/22 1030  BP: (!) 138/104 (!) 159/85  Pulse: 66 66  Resp: 15 (!) 21  Temp:    SpO2: (!) 88% 94%     General: Awake, no distress.  CV:  Good peripheral perfusion.  Symmetric pulses Resp:  Normal effort.  Unlabored breathing, clear to auscultation Abd:  No distention.  Soft nontender Other:  No blood in oropharynx.  Area of recent bleeding noted in the right anterior nare   ED Results / Procedures / Treatments   Labs (all labs ordered are listed, but only  abnormal results are displayed) Labs Reviewed  BASIC METABOLIC PANEL - Abnormal; Notable for the following components:      Result Value   Glucose, Bld 101 (*)    BUN 46 (*)    Creatinine, Ser 3.60 (*)    Calcium 8.5 (*)    GFR, Estimated 19 (*)    All other components within normal limits  CBC - Abnormal; Notable for the following components:   RBC 3.53 (*)    Hemoglobin 10.8 (*)    HCT 32.7 (*)    All other components within normal limits  TROPONIN I (HIGH SENSITIVITY)  TROPONIN I (HIGH SENSITIVITY)     EKG     RADIOLOGY MRI brain reviewed and interpreted by me, no obvious intracranial hemorrhage.  Radiology report reviewed as well.  No acute findings on MRI or MRA sequence of the head and neck.    PROCEDURES:  Critical Care performed: No  Procedures   MEDICATIONS ORDERED IN ED: Medications  LORazepam (ATIVAN) injection 1 mg (has no administration in time range)  fentaNYL (SUBLIMAZE) injection 50 mcg (50 mcg Intravenous Given 01/05/22 0944)  ondansetron (  ZOFRAN) injection 4 mg (4 mg Intravenous Given 01/05/22 0943)     IMPRESSION / MDM / ASSESSMENT AND PLAN / ED COURSE  I reviewed the triage vital signs and the nursing notes.                              Differential diagnosis includes, but is not limited to, intracranial hemorrhage, idiopathic headache, uncontrolled hypertension     Patient presents with severe headache in the setting of prior cerebral aneurysm and brain surgery.  Blood pressure was noted at home to be as high as 200/100.  On arrival to the ED it is 150/100, not requiring acute antihypertensives currently.  Labs show stable CKD.  We will need to obtain neuroimaging with MRI/MRA due to GFR too low for CT IV contrast.  ----------------------------------------- 12:52 PM on 01/05/2022 ----------------------------------------- MRI/MRA unremarkable.  Labs unremarkable.  Pain improved.  Tolerating oral intake.  Remains neurologically intact,  stable for discharge.  Blood pressure currently 150/80, would not adjust medications at this time.  Patient instructed to follow-up with primary care.      FINAL CLINICAL IMPRESSION(S) / ED DIAGNOSES   Final diagnoses:  Secondary hypertension  Acute nonintractable headache, unspecified headache type     Rx / DC Orders   ED Discharge Orders     None        Note:  This document was prepared using Dragon voice recognition software and may include unintentional dictation errors.   Carrie Mew, MD 01/05/22 1253

## 2022-01-22 ENCOUNTER — Emergency Department
Admission: EM | Admit: 2022-01-22 | Discharge: 2022-01-23 | Disposition: A | Discharge status: Home / Self Care | Payer: Medicare Other | Attending: Emergency Medicine | Admitting: Emergency Medicine

## 2022-01-22 ENCOUNTER — Encounter: Payer: Self-pay | Admitting: Radiology

## 2022-01-22 ENCOUNTER — Encounter: Payer: Self-pay | Admitting: Nurse Practitioner

## 2022-01-22 ENCOUNTER — Ambulatory Visit: Payer: Medicare Other | Admitting: Nurse Practitioner

## 2022-01-22 ENCOUNTER — Other Ambulatory Visit: Payer: Self-pay

## 2022-01-22 ENCOUNTER — Emergency Department: Payer: Medicare Other

## 2022-01-22 VITALS — BP 180/98 | HR 84 | Ht 67.0 in | Wt 205.8 lb

## 2022-01-22 DIAGNOSIS — I248 Other forms of acute ischemic heart disease: Secondary | ICD-10-CM | POA: Diagnosis not present

## 2022-01-22 DIAGNOSIS — J449 Chronic obstructive pulmonary disease, unspecified: Secondary | ICD-10-CM | POA: Insufficient documentation

## 2022-01-22 DIAGNOSIS — I129 Hypertensive chronic kidney disease with stage 1 through stage 4 chronic kidney disease, or unspecified chronic kidney disease: Secondary | ICD-10-CM | POA: Insufficient documentation

## 2022-01-22 DIAGNOSIS — N189 Chronic kidney disease, unspecified: Secondary | ICD-10-CM | POA: Diagnosis not present

## 2022-01-22 DIAGNOSIS — N184 Chronic kidney disease, stage 4 (severe): Secondary | ICD-10-CM | POA: Diagnosis not present

## 2022-01-22 DIAGNOSIS — Z20822 Contact with and (suspected) exposure to covid-19: Secondary | ICD-10-CM | POA: Insufficient documentation

## 2022-01-22 DIAGNOSIS — I5032 Chronic diastolic (congestive) heart failure: Secondary | ICD-10-CM

## 2022-01-22 DIAGNOSIS — M25512 Pain in left shoulder: Secondary | ICD-10-CM | POA: Insufficient documentation

## 2022-01-22 DIAGNOSIS — I10 Essential (primary) hypertension: Secondary | ICD-10-CM | POA: Diagnosis not present

## 2022-01-22 DIAGNOSIS — F1910 Other psychoactive substance abuse, uncomplicated: Secondary | ICD-10-CM

## 2022-01-22 LAB — BASIC METABOLIC PANEL
Anion gap: 6 (ref 5–15)
BUN: 48 mg/dL — ABNORMAL HIGH (ref 6–20)
CO2: 23 mmol/L (ref 22–32)
Calcium: 8.2 mg/dL — ABNORMAL LOW (ref 8.9–10.3)
Chloride: 106 mmol/L (ref 98–111)
Creatinine, Ser: 3.74 mg/dL — ABNORMAL HIGH (ref 0.61–1.24)
GFR, Estimated: 18 mL/min — ABNORMAL LOW (ref >60)
Glucose, Bld: 157 mg/dL — ABNORMAL HIGH (ref 70–99)
Potassium: 3.7 mmol/L (ref 3.5–5.1)
Sodium: 135 mmol/L (ref 135–145)

## 2022-01-22 LAB — HEPATIC FUNCTION PANEL
ALT: 11 U/L (ref 0–44)
AST: 14 U/L — ABNORMAL LOW (ref 15–41)
Albumin: 3.5 g/dL (ref 3.5–5.0)
Alkaline Phosphatase: 63 U/L (ref 38–126)
Bilirubin, Direct: <0.1 mg/dL (ref 0.0–0.2)
Total Bilirubin: 0.5 mg/dL (ref 0.3–1.2)
Total Protein: 6.4 g/dL — ABNORMAL LOW (ref 6.5–8.1)

## 2022-01-22 LAB — TROPONIN I (HIGH SENSITIVITY): Troponin I (High Sensitivity): 31 ng/L — ABNORMAL HIGH (ref <18)

## 2022-01-22 LAB — CBC
HCT: 34.1 % — ABNORMAL LOW (ref 39.0–52.0)
Hemoglobin: 11.2 g/dL — ABNORMAL LOW (ref 13.0–17.0)
MCH: 30 pg (ref 26.0–34.0)
MCHC: 32.8 g/dL (ref 30.0–36.0)
MCV: 91.4 fL (ref 80.0–100.0)
Platelets: 300 10*3/uL (ref 150–400)
RBC: 3.73 MIL/uL — ABNORMAL LOW (ref 4.22–5.81)
RDW: 13 % (ref 11.5–15.5)
WBC: 7.6 10*3/uL (ref 4.0–10.5)
nRBC: 0 % (ref 0.0–0.2)

## 2022-01-22 LAB — LIPASE, BLOOD: Lipase: 35 U/L (ref 11–51)

## 2022-01-22 IMAGING — DX DG CHEST 1V PORT
1 series · 1 of 1 positions shown · non-contrast
Comparison: [DATE]

CLINICAL DATA: Left-sided chest pain radiating to shoulder

EXAM:
PORTABLE CHEST 1 VIEW

[chest ap]
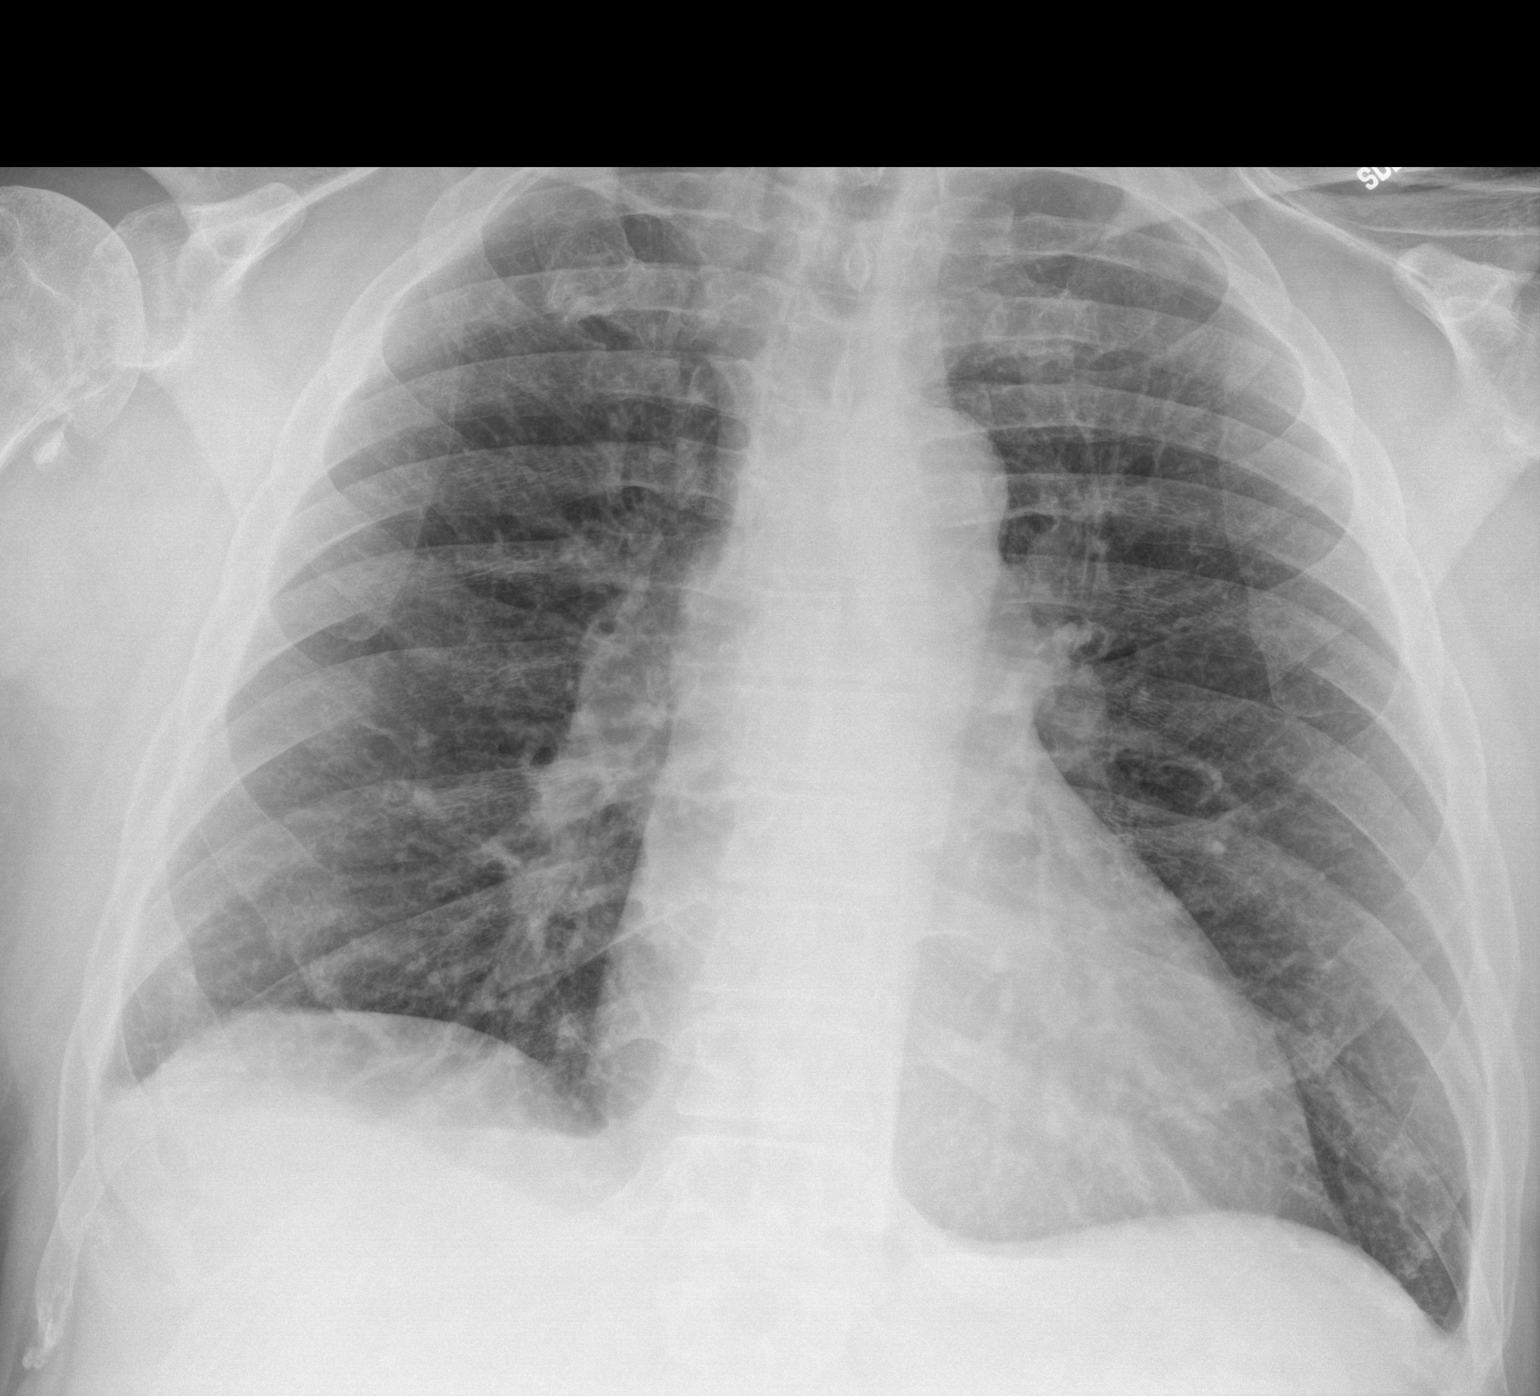

[1 of 1 positions shown; findings below may reference images not displayed]

FINDINGS: The heart size and mediastinal contours are within normal limits.
Both lungs are clear. The visualized skeletal structures are
unremarkable.
IMPRESSION: No active disease.

## 2022-01-22 MED ORDER — CLONIDINE HCL 0.1 MG PO TABS
0.2000 mg | ORAL_TABLET | Freq: Once | ORAL | Status: AC
  Administered 2022-01-22: 0.2 mg via ORAL
  Filled 2022-01-22: qty 2

## 2022-01-22 MED ORDER — LISINOPRIL 10 MG PO TABS
10.0000 mg | ORAL_TABLET | Freq: Once | ORAL | Status: AC
  Administered 2022-01-22: 10 mg via ORAL
  Filled 2022-01-22: qty 1

## 2022-01-22 MED ORDER — CLONIDINE HCL 0.1 MG PO TABS
0.1000 mg | ORAL_TABLET | Freq: Once | ORAL | Status: AC
  Administered 2022-01-22 (×2): 0.1 mg via ORAL

## 2022-01-22 MED ORDER — KETAMINE HCL 100 MG/ML IJ SOLN
1.5000 mg/kg | Freq: Once | INTRAMUSCULAR | Status: DC
  Filled 2022-01-22: qty 1.4
  Filled 2022-01-22: qty 2.8

## 2022-01-22 MED ORDER — CLONIDINE HCL 0.1 MG PO TABS: 0.1000 mg | ORAL_TABLET | Freq: Once | ORAL | Status: DC

## 2022-01-22 NOTE — Patient Instructions (Signed)
Medication Instructions:  Your physician recommends that you continue on your current medications as directed. Please refer to the Current Medication list given to you today.  Please take your Carvedilol 25 mg and Amlodipine 10 mg as soon as you get home.  *If you need a refill on your cardiac medications before your next appointment, please call your pharmacy*   Lab Work: None ordered If you have labs (blood work) drawn today and your tests are completely normal, you will receive your results only by: [place] (if you have MyChart) OR A paper copy in the mail If you have any lab test that is abnormal or we need to change your treatment, we will call you to review the results.   Testing/Procedures: None ordered   Follow-Up: At [facility], you and your health needs are our priority.  As part of our continuing mission to provide you with exceptional heart care, we have created designated Provider Care Teams.  These Care Teams include your primary Cardiologist (physician) and Advanced Practice Providers (APPs -  Physician Assistants and Nurse Practitioners) who all work together to provide you with the care you need, when you need it.  We recommend signing up for the patient portal called "MyChart".  Sign up information is provided on this After Visit Summary.  MyChart is used to connect with patients for Virtual Visits (Telemedicine).  Patients are able to view lab/test results, encounter notes, upcoming appointments, etc.  Non-urgent messages can be sent to your provider as well.   To learn more about what you can do with MyChart, go to [email or web address].    Your next appointment:   2-3 week(s)  The format for your next appointment:   In Person  Provider:   You may see Kathlyn Sacramento, MD or one of the following Advanced Practice Providers on your designated Care Team:   Murray Hodgkins, NP Christell Faith, PA-C Cadence Kathlen Mody, [place]  Other Instructions N/A

## 2022-01-22 NOTE — Progress Notes (Signed)
Office Visit    Patient Name: Derek Cooley. Date of Encounter: 01/22/2022  Primary Care Provider:  Jon Billings, NP Primary Cardiologist:  Kathlyn Sacramento, MD  Chief Complaint    56 year old male with a history of hypertension, diabetes, subarachnoid hemorrhage status post craniotomy and clipping, sleep apnea, obesity, stage IV chronic kidney disease, abdominal abscess with chronic MRSA infection, and polysubstance abuse, who presents for follow-up after recent hospitalization for hypertensive emergency.  Past Medical History    Past Medical History:  Diagnosis Date   Abdominal abscess    Acute pancreatitis    Anxiety    Asthma    Back pain    Chronic heart failure with preserved ejection fraction (HFpEF) ([place])    a. 11/2021 Echo: EF 55-60%, no rwma, mod LVH, GrI DD. Nl RV size/fxn. Mildly dil LA.   CKD (chronic kidney disease), stage IV (HCC)    Depression    Emphysema of lung ([place])    right sided   GERD (gastroesophageal reflux disease)    Hemorrhage into subarachnoid space of neuraxis ([place]) 01/26/2010   Hypertension    Mild cognitive impairment    MRSA (methicillin resistant Staphylococcus aureus)    Obesity    Panic disorder    Perforated bowel ([place]) 12/10/2005   Tempoary Colostomy Bag, Skin Graft for Abd wound   Polysubstance abuse ([place])    Sleep apnea    sleep study 2013   Subarachnoid hemorrhage ([place]) 12/10/2009   coil placed   Tobacco abuse    Type 2 diabetes mellitus without complication, without long-term current use of insulin ([place]) 04/17/2016   Vitreous hemorrhage ([place]) 03/27/2011   Overview:  Bilateral; 01/2010, from [facility]    Past Surgical History:  Procedure Laterality Date   COLON SURGERY  2007   colostomy bag placed s/p perforated bowel   COLONOSCOPY WITH PROPOFOL N/A 05/18/2020   Procedure: COLONOSCOPY WITH PROPOFOL;  Surgeon: Lin Landsman, MD;  Location: [facility] ENDOSCOPY;  Service: Gastroenterology;  Laterality: N/A;   KNEE SURGERY  Right    Perforated bowel      Allergies  Allergies  Allergen Reactions   Atorvastatin     Joint Aches - Severe Joint Aches - Severe Joint Aches - Severe   Avelox [Moxifloxacin Hcl In [place]]     Muscle pain   Buprenorphine     Mouth sores, confusion, shaking   Dilaudid [Hydromorphone Hcl] Hives   Fluoxetine Itching   Levofloxacin Other (See Comments)    Joint Pain   Morphine And Related    Other     Muscle pain   Suboxone [Buprenorphine Hcl-Naloxone Hcl] Other (See Comments)    Rash and confused   Vancomycin     Renal insufficiency    History of Present Illness    56 year old male with history of hypertension, diabetes, 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  He presented to [facility] in mid December with progressive dyspnea and was found to be markedly hypertensive.  Labs were notable for stable normocytic anemia with mild troponin elevation to a peak of 18.  Echocardiogram was performed and showed normal LV function with moderate LVH and grade 1 diastolic dysfunction.  He was discharged home on amlodipine and carvedilol with recommendation for outpatient follow-up and consideration for outpatient stress testing.  He was readmitted to [facility] January 16, in the setting of increased somnolence.  He was found to be rhinovirus positive, and was also noted have worsening AKI.  He was noncompliant with his medications at home and ultimately home doses of carvedilol and amlodipine were resumed with blood pressures in the 140s over 60s.  Seen in the [facility] ED January 27 with headaches and reported elevated blood pressures.  Pressure was 150/100 and labs showed stable CKD.  MRI/a was unremarkable.  He was discharged home and advised follow-up.  Patient's sister is present with him today.  Patient was previously living with his sister  and she was helping to sort out his medications, but now he is living with a cousin.  He has no help with his medicines at home and in the setting of short-term memory loss, often cannot remember if he took his medicines and errors on the side of caution and ends up not taking them.  He is not sure when he last took his carvedilol or amlodipine.  He does not routinely check his blood pressure at home.  He has chronic pain which he says is from head to toe and has been present ever since a fall many years ago.  He thinks he might occasionally experience exertional chest discomfort.  He denies dyspnea exertion, palpitations, PND, orthopnea, dizziness, syncope, edema, or early satiety.  Still drinking 2 to 3, 40 ounce beers most days of the week.  He is hypertensive today at 204/100 and 210/110 on repeat.  Says he has not used any drugs over the past few weeks.  Home Medications    Current Outpatient Medications  Medication Sig Dispense Refill   albuterol (VENTOLIN HFA) 108 (90 Base) MCG/ACT inhaler Inhale 1-2 puffs into the lungs every 6 (six) hours as needed for wheezing or shortness of breath. 18 g 2   amLODipine (NORVASC) 10 MG tablet Take 1 tablet (10 mg total) by mouth daily. 90 tablet 2   budesonide-formoterol (SYMBICORT) 160-4.5 MCG/ACT inhaler Inhale 2 puffs into the lungs in the morning and at bedtime. 10.2 each 12   carvedilol (COREG) 25 MG tablet Take 1 tablet (25 mg total) by mouth 2 (two) times daily with a meal. 60 tablet 3   DULoxetine (CYMBALTA) 60 MG capsule Take 1 capsule (60 mg total) by mouth daily. 30 capsule 2   fenofibrate micronized (LOFIBRA) 67 MG capsule TAKE 1 CAPSULE EVERY DAY BEFORE BREAKFAST 90 capsule 2   glucose blood (ONETOUCH ULTRA) test strip USE TO TEST BLOOD SUGAR DAILY 100 each 3   hydrOXYzine (ATARAX) 50 MG tablet TAKE ONE TABLET 3 TIMES DAILY AS NEEDED 90 tablet 1   pantoprazole (PROTONIX) 40 MG tablet Take 1 tablet (40 mg total) by mouth 2 (two) times daily as  needed. 60 tablet 2   pregabalin (LYRICA) 50 MG capsule Take 1 capsule (50 mg total) by mouth 2 (two) times daily. 60 capsule 2   topiramate (TOPAMAX) 50 MG tablet Take 1 tablet (50 mg total) by mouth 2 (two) times daily. 60 tablet 1   traZODone (DESYREL) 100 MG tablet Take 100 mg by mouth at bedtime.     Current Facility-Administered Medications  Medication Dose Route Frequency Provider Last Rate Last Admin   cloNIDine (CATAPRES) tablet 0.1 mg  0.1 mg Oral Once Theora Gianotti, NP         Review of Systems    Complains of chronic pain throughout his body.  Worse in his shoulders.  Thinks he might occasionally have exertional chest discomfort.  Denies dyspnea, palpitations, PND, apnea,  dizziness, syncope, edema, or early satiety.  All other systems reviewed and are otherwise negative except as noted above.    Physical Exam    VS:  BP (!) 180/98    Pulse 84    Ht 5\' 7"  (1.702 m)    Wt 205 lb 12.8 oz (93.4 kg)    SpO2 98%    BMI 32.23 kg/m  , BMI Body mass index is 32.23 kg/m.     Vitals:   01/22/22 1754 01/22/22 1755  BP: (!) 190/100 (!) 180/98  Pulse:    SpO2:      GEN: Well nourished, well developed, in no acute distress. HEENT: normal. Neck: Supple, no JVD, carotid bruits, or masses. Cardiac: RRR, no murmurs, rubs, or gallops. No clubbing, cyanosis, edema.  Radials/PT 2+ and equal bilaterally.  Respiratory:  Respirations regular and unlabored, clear to auscultation bilaterally. GI: Soft, nontender, nondistended, BS + x 4. MS: no deformity or atrophy. Skin: warm and dry, no rash. Neuro:  Strength and sensation are intact. Psych: Normal affect.  Accessory Clinical Findings    ECG personally reviewed by me today -regular sinus rhythm, 84, septal infarct- no acute changes.  Lab Results  Component Value Date   WBC 9.1 01/05/2022   HGB 10.8 (L) 01/05/2022   HCT 32.7 (L) 01/05/2022   MCV 92.6 01/05/2022   PLT 298 01/05/2022   Lab Results  Component Value Date    CREATININE 3.60 (H) 01/05/2022   BUN 46 (H) 01/05/2022   NA 142 01/05/2022   K 4.2 01/05/2022   CL 111 01/05/2022   CO2 23 01/05/2022   Lab Results  Component Value Date   ALT 14 12/07/2021   AST 14 12/07/2021   ALKPHOS 115 12/07/2021   BILITOT <0.2 12/07/2021   Lab Results  Component Value Date   CHOL 161 04/17/2021   HDL 24 (L) 04/17/2021   LDLCALC 104 (H) 04/17/2021   TRIG 187 (H) 04/17/2021   CHOLHDL 6.7 (H) 04/17/2021    Lab Results  Component Value Date   HGBA1C 5.4 11/26/2021    Assessment & Plan    1.  Essential hypertension: Patient admitted in December 2022 with hypertensive emergency and acute on chronic HFpEF.  Discharged home on beta-blocker and calcium channel blocker but had repeat admission to [facility] in mid January for similar, in the setting of noncompliance, and more recently in ED evaluation on January 27 for headaches and hypertension.  He is unsure when he last took his home carvedilol and amlodipine (25 mg twice daily and 10 mg daily, respectively).  His blood pressure was 204/100 today.  We provided him with clonidine 0.1 mg orally in the office and pressure barely improved.  We gave him a second dose and within an hour, pressure came into the 180 range.  He was discharged from the office and told to go home immediately and take 25 mg of carvedilol and his amlodipine 10 mg.  His sister was present with him today and we discussed the importance of sorting out a plan to ensure that he is taking his medications as prescribed.  He is currently staying with a cousin.  We agreed to follow-up in about 2 weeks to reevaluate blood pressure medication and consider additional agents if necessary.  2.  Chronic HFpEF: Normal EF by echo in December.  Euvolemic on examination despite elevated blood pressures.  Stressed the importance of medication compliance.  3.  Demand ischemia: Patient with mild troponin elevation during hospitalization with  hypertensive emergency.  Echo  at that time showed normal LV function.  He thinks he sometimes experiences exertional chest pressure.  We discussed the potential role of noninvasive ischemic evaluation however, in the setting of ongoing hypertension, will defer until pressure has improved.  4.  Polysubstance abuse: Smokes cigarettes.  Also drinking 2-3, 40 ounce beers most days of the week.  Says he has not used marijuana, methamphetamines, or cocaine since his hospitalization at [facility].  Complete cessation of all substances advised.  5.  Obstructive sleep apnea: Noncompliant with CPAP.  6.  Stage IV chronic kidney disease: Creatinine stable at 3.6 in January.  7.  Chronic pain: On multiple medications but noncompliant.  8.  Disposition: Follow-up in 2 weeks to reevaluate blood pressure management.   Murray Hodgkins, NP 01/22/2022, 5:58 PM

## 2022-01-22 NOTE — ED Triage Notes (Signed)
Pt was seen at the cardiologist today. When he went home he started having left sided chest pain that radiates to the shoulder and down to his left hip 8/10.

## 2022-01-22 NOTE — ED Notes (Signed)
Pt resting comfortably in bed, NAD. This RN updated pt on pain medication status; pt v/u. No needs verbalized at this time. Bed low & locked; wife at [facility]; call light within reach.

## 2022-01-22 NOTE — ED Notes (Signed)
This RN called & spoke with pharmacy about the ketamine concentration being for IV use only. He will change the form & an RN will need to go to pharmacy to pick up the medication once it is ready.

## 2022-01-22 NOTE — ED Notes (Signed)
Pt placed on cardiac, BP, pulse ox monitors.

## 2022-01-22 NOTE — ED Notes (Signed)
Pt c/o L sided CP radiating down L arm, onset last night. Pt saw cardiologist today, BP was elevated at office & he was given medication in the office for that. Pt went home to rest. CP still ongoing.

## 2022-01-23 LAB — RESP PANEL BY RT-PCR (FLU A&B, COVID) ARPGX2
Influenza A by PCR: NEGATIVE
Influenza B by PCR: NEGATIVE
SARS Coronavirus 2 by RT PCR: NEGATIVE

## 2022-01-23 MED ORDER — LISINOPRIL 10 MG PO TABS
10.0000 mg | ORAL_TABLET | Freq: Every day | ORAL | 0 refills | Status: DC
Start: 2022-01-23 — End: 2022-05-22

## 2022-01-23 MED ORDER — LIDOCAINE 5 % EX PTCH: 1.0000 | MEDICATED_PATCH | Freq: Two times a day (BID) | CUTANEOUS | 0 refills | Status: DC

## 2022-01-23 MED ORDER — DICLOFENAC SODIUM 1 % EX GEL: 2.0000 g | Freq: Four times a day (QID) | CUTANEOUS | 0 refills | Status: DC

## 2022-01-23 MED ORDER — KETAMINE HCL 50 MG/5ML IJ SOSY
0.3000 mg/kg | PREFILLED_SYRINGE | Freq: Once | INTRAMUSCULAR | Status: AC
  Administered 2022-01-23: 28 mg via INTRAVENOUS

## 2022-01-23 NOTE — ED Provider Notes (Signed)
[facility] Provider Note    Event Date/Time   First MD Initiated Contact with Patient 01/22/22 2211     (approximate)   History   Chest Pain and Abdominal Pain (Left side)   HPI  [name] is a 56 y.o. male With a history of anxiety, hypertension, CKD, COPD, brain aneurysm status post coiling, polysubstance abuse who comes ED complaining of left shoulder pain which radiates down his left side into his hip.  No alleviating factors, not pleuritic, not exertional.  Intermittent.  Worse with left shoulder movement.  It is off and on, gradual onset and worsening, no sudden abrupt tearing onset.  He has had similar pain in the past related to musculoskeletal injury.     Physical Exam   Triage Vital Signs: ED Triage Vitals  Enc Vitals Group     BP 01/22/22 2107 (!) 173/94     Pulse Rate 01/22/22 2107 69     Resp 01/22/22 2107 20     Temp 01/22/22 2107 97.6 F (36.4 C)     Temp Source 01/22/22 2107 Oral     SpO2 01/22/22 2107 93 %     Weight --      Height --      Head Circumference --      Peak Flow --      Pain Score 01/22/22 2115 8     Pain Loc --      Pain Edu? --      Excl. in [place]? --     Most recent vital signs: Vitals:   01/22/22 2330 01/22/22 2349  BP: (!) 176/128 (!) 159/89  Pulse: 62 64  Resp: 18 18  Temp:    SpO2: 99% 100%     General: Awake, no distress.  CV:  Good peripheral perfusion.  Regular rate and rhythm.  Normal heart sounds symmetric pulses Resp:  Normal effort.  Clear to auscultation bilaterally Abd:  No distention.  Soft and nontender Other:  Tenderness at the posterior left shoulder which reproduces his pain.   ED Results / Procedures / Treatments   Labs (all labs ordered are listed, but only abnormal results are displayed) Labs Reviewed  BASIC METABOLIC PANEL - Abnormal; Notable for the following components:      Result Value   Glucose, Bld 157 (*)    BUN 48 (*)    Creatinine, Ser 3.74 (*)     Calcium 8.2 (*)    GFR, Estimated 18 (*)    All other components within normal limits  CBC - Abnormal; Notable for the following components:   RBC 3.73 (*)    Hemoglobin 11.2 (*)    HCT 34.1 (*)    All other components within normal limits  HEPATIC FUNCTION PANEL - Abnormal; Notable for the following components:   Total Protein 6.4 (*)    AST 14 (*)    All other components within normal limits  TROPONIN I (HIGH SENSITIVITY) - Abnormal; Notable for the following components:   Troponin I (High Sensitivity) 31 (*)    All other components within normal limits  RESP PANEL BY RT-PCR (FLU A&B, COVID) ARPGX2  LIPASE, BLOOD     EKG  Interpreted by me Normal sinus rhythm rate of 68.  Left axis, normal intervals.  Poor R wave progression.  Normal ST segments and T waves.  No acute ischemic changes.   RADIOLOGY Chest x-ray viewed and interpreted by me, appears unremarkable.  Radiology report reviewed  PROCEDURES:  Critical Care performed: No  Procedures   MEDICATIONS ORDERED IN ED: Medications  ketamine (KETALAR) injection 140 mg (has no administration in time range)  cloNIDine (CATAPRES) tablet 0.2 mg (0.2 mg Oral Given 01/22/22 2250)  lisinopril (ZESTRIL) tablet 10 mg (10 mg Oral Given 01/22/22 2250)     IMPRESSION / MDM / ASSESSMENT AND PLAN / ED COURSE  I reviewed the triage vital signs and the nursing notes.                              Differential diagnosis includes, but is not limited to, pneumonia, pneumothorax, musculoskeletal pain, influenza-like illness  The patient is on the cardiac monitor to evaluate for evidence of arrhythmia and/or significant heart rate changes.**}  Patient presents with left shoulder pain, musculoskeletal in nature and reproducible on exam.  Due to his comorbidities, check labs which are unremarkable.  Chest x-ray unremarkable.  Suspect viral illness due to the multifocal joint pains.  Not requiring admission due to normal vitals, no lung  infiltrates, 100% oxygen saturation on room air.  Will increase blood pressure control with low-dose lisinopril in addition to his other medication.  Recommend Lidoderm and topical Voltaren for his pain control due to history of opioid dependence and CKD.     FINAL CLINICAL IMPRESSION(S) / ED DIAGNOSES   Final diagnoses:  Left shoulder pain, unspecified chronicity     Rx / DC Orders   ED Discharge Orders          Ordered    lidocaine (LIDODERM) 5 %  Every 12 hours        01/23/22 0007    diclofenac Sodium (VOLTAREN) 1 % GEL  4 times daily        01/23/22 0007    lisinopril (ZESTRIL) 10 MG tablet  Daily        01/23/22 0007             Note:  This document was prepared using Dragon voice recognition software and may include unintentional dictation errors.   Carrie Mew, MD 01/23/22 [phone]

## 2022-01-23 NOTE — Discharge Instructions (Addendum)
Your chest x-ray and lab tests are all okay today.  We will call you if your COVID or flu test come back positive.  We have sent a prescription for lisinopril to take in addition to your other medicines to improve blood pressure control.  Use topical Voltaren gel and lidocaine patches on your shoulder to alleviate the joint pain.

## 2022-02-01 ENCOUNTER — Encounter (HOSPITAL_COMMUNITY): Payer: Self-pay | Admitting: Radiology

## 2022-02-02 ENCOUNTER — Other Ambulatory Visit: Payer: Self-pay

## 2022-02-02 ENCOUNTER — Ambulatory Visit (INDEPENDENT_AMBULATORY_CARE_PROVIDER_SITE_OTHER): Payer: Medicare Other | Admitting: Nurse Practitioner

## 2022-02-02 ENCOUNTER — Encounter: Payer: Self-pay | Admitting: Nurse Practitioner

## 2022-02-02 VITALS — BP 149/89 | HR 65 | Temp 98.5°F | Wt 209.0 lb

## 2022-02-02 DIAGNOSIS — N189 Chronic kidney disease, unspecified: Secondary | ICD-10-CM

## 2022-02-02 DIAGNOSIS — J441 Chronic obstructive pulmonary disease with (acute) exacerbation: Secondary | ICD-10-CM

## 2022-02-02 DIAGNOSIS — E1159 Type 2 diabetes mellitus with other circulatory complications: Secondary | ICD-10-CM

## 2022-02-02 DIAGNOSIS — E1122 Type 2 diabetes mellitus with diabetic chronic kidney disease: Secondary | ICD-10-CM | POA: Diagnosis not present

## 2022-02-02 DIAGNOSIS — I152 Hypertension secondary to endocrine disorders: Secondary | ICD-10-CM

## 2022-02-02 DIAGNOSIS — D631 Anemia in chronic kidney disease: Secondary | ICD-10-CM

## 2022-02-02 DIAGNOSIS — N182 Chronic kidney disease, stage 2 (mild): Secondary | ICD-10-CM

## 2022-02-02 NOTE — Assessment & Plan Note (Signed)
Chronic. Was having dizziness when patient was at Nephrology and Lisinopril was held.  Recommend restarting medication now the BP has improved and dizziness has resolved. Labs ordered today. Will make recommendations based on lab results.

## 2022-02-02 NOTE — Assessment & Plan Note (Signed)
Chronic. Still smoking daily. Continue Symbicort. Continue with PRN use of Albuterol. Follow up in 3 months for reevaluation.

## 2022-02-02 NOTE — Assessment & Plan Note (Signed)
Labs ordered today.  Extensive conversation had regarding patient's kidney function.  Importance of avoiding NSAIDS and avoiding other medications that will hurt kidney function.  Labs ordered today. Will make recommendations based on lab results.  Follow up in 3 months.

## 2022-02-02 NOTE — Progress Notes (Signed)
BP (!) 149/89 (BP Location: Left Arm, Cuff Size: Normal)    Pulse 65    Temp 98.5 F (36.9 C) (Oral)    Wt 209 lb (94.8 kg)    SpO2 98%    BMI 32.73 kg/m    Subjective:    Patient ID: Derek Binet., male    DOB: [birth year], 56 y.o.   MRN: [id number]  HPI: Derek Bonner. is a 57 y.o. male  No chief complaint on file.  HYPERTENSION / HYPERLIPIDEMIA Satisfied with current treatment? no Duration of hypertension: years BP monitoring frequency: not checking BP range:  BP medication side effects: no Past BP meds: lisinopril Duration of hyperlipidemia: years Cholesterol medication side effects: no Cholesterol supplements: none Past cholesterol medications: none Medication compliance: excellent compliance Aspirin: no Recent stressors: no Recurrent headaches: no Visual changes: no Palpitations: no Dyspnea: no Chest pain: no Lower extremity edema: no Dizzy/lightheaded: no   CHRONIC KIDNEY DISEASE CKD status: controlled Medications renally dose: yes Previous renal evaluation: yes Pneumovax:  Not up to Date Influenza Vaccine:  Not up to Date   Relevant past medical, surgical, family and social history reviewed and updated as indicated. Interim medical history since our last visit reviewed. Allergies and medications reviewed and updated.  Review of Systems  Eyes:  Negative for visual disturbance.  Respiratory:  Negative for chest tightness and shortness of breath.   Cardiovascular:  Negative for chest pain, palpitations and leg swelling.  Endocrine: Negative for polydipsia and polyuria.  Neurological:  Negative for dizziness, light-headedness, numbness and headaches.   Per HPI unless specifically indicated above     Objective:    BP (!) 149/89 (BP Location: Left Arm, Cuff Size: Normal)    Pulse 65    Temp 98.5 F (36.9 C) (Oral)    Wt 209 lb (94.8 kg)    SpO2 98%    BMI 32.73 kg/m   Wt Readings from Last 3 Encounters:  02/02/22 209 lb (94.8 kg)  01/22/22  205 lb 12.8 oz (93.4 kg)  01/05/22 206 lb (93.4 kg)    Physical Exam Vitals and nursing note reviewed.  Constitutional:      General: He is not in acute distress.    Appearance: Normal appearance. He is not ill-appearing, toxic-appearing or diaphoretic.  HENT:     Head: Normocephalic.     Right Ear: External ear normal.     Left Ear: External ear normal.     Nose: Nose normal. No congestion or rhinorrhea.     Mouth/Throat:     Mouth: Mucous membranes are moist.  Eyes:     General:        Right eye: No discharge.        Left eye: No discharge.     Extraocular Movements: Extraocular movements intact.     Conjunctiva/sclera: Conjunctivae normal.     Pupils: Pupils are equal, round, and reactive to light.  Cardiovascular:     Rate and Rhythm: Normal rate and regular rhythm.     Heart sounds: No murmur heard. Pulmonary:     Effort: Pulmonary effort is normal. No respiratory distress.     Breath sounds: Normal breath sounds. No wheezing, rhonchi or rales.  Abdominal:     General: Abdomen is flat. Bowel sounds are normal.  Musculoskeletal:     Cervical back: Normal range of motion and neck supple.  Skin:    General: Skin is warm and dry.     Capillary Refill: Capillary  refill takes less than 2 seconds.  Neurological:     General: No focal deficit present.     Mental Status: He is alert and oriented to person, place, and time.  Psychiatric:        Mood and Affect: Mood normal.        Behavior: Behavior normal.        Thought Content: Thought content normal.        Judgment: Judgment normal.    Results for orders placed or performed during the hospital encounter of 01/22/22  Resp Panel by RT-PCR (Flu A&B, Covid) Nasopharyngeal Swab   Specimen: Nasopharyngeal Swab; Nasopharyngeal(NP) swabs in vial transport medium  Result Value Ref Range   SARS Coronavirus 2 by RT PCR NEGATIVE NEGATIVE   Influenza A by PCR NEGATIVE NEGATIVE   Influenza B by PCR NEGATIVE NEGATIVE  Basic  metabolic panel  Result Value Ref Range   Sodium 135 135 - 145 mmol/L   Potassium 3.7 3.5 - 5.1 mmol/L   Chloride 106 98 - 111 mmol/L   CO2 23 22 - 32 mmol/L   Glucose, Bld 157 (H) 70 - 99 mg/dL   BUN 48 (H) 6 - 20 mg/dL   Creatinine, Ser 3.74 (H) 0.61 - 1.24 mg/dL   Calcium 8.2 (L) 8.9 - 10.3 mg/dL   GFR, Estimated 18 (L) >60 mL/min   Anion gap 6 5 - 15  CBC  Result Value Ref Range   WBC 7.6 4.0 - 10.5 K/uL   RBC 3.73 (L) 4.22 - 5.81 MIL/uL   Hemoglobin 11.2 (L) 13.0 - 17.0 g/dL   HCT 34.1 (L) 39.0 - 52.0 %   MCV 91.4 80.0 - 100.0 fL   MCH 30.0 26.0 - 34.0 pg   MCHC 32.8 30.0 - 36.0 g/dL   RDW 13.0 11.5 - 15.5 %   Platelets 300 150 - 400 K/uL   nRBC 0.0 0.0 - 0.2 %  Hepatic function panel  Result Value Ref Range   Total Protein 6.4 (L) 6.5 - 8.1 g/dL   Albumin 3.5 3.5 - 5.0 g/dL   AST 14 (L) 15 - 41 U/L   ALT 11 0 - 44 U/L   Alkaline Phosphatase 63 38 - 126 U/L   Total Bilirubin 0.5 0.3 - 1.2 mg/dL   Bilirubin, Direct <0.1 0.0 - 0.2 mg/dL   Indirect Bilirubin NOT CALCULATED 0.3 - 0.9 mg/dL  Lipase, blood  Result Value Ref Range   Lipase 35 11 - 51 U/L  Troponin I (High Sensitivity)  Result Value Ref Range   Troponin I (High Sensitivity) 31 (H) <18 ng/L      Assessment & Plan:   Problem List Items Addressed This Visit       Cardiovascular and Mediastinum   Hypertension associated with diabetes ([place]) - Primary    Chronic. Was having dizziness when patient was at Nephrology and Lisinopril was held.  Recommend restarting medication now the BP has improved and dizziness has resolved. Labs ordered today. Will make recommendations based on lab results.      Relevant Orders   Comp Met (CMET)   CBC w/Diff     Respiratory   COPD (chronic obstructive pulmonary disease) (HCC)    Chronic. Still smoking daily. Continue Symbicort. Continue with PRN use of Albuterol. Follow up in 3 months for reevaluation.        Endocrine   Type 2 diabetes with stage 2 chronic kidney  disease GFR 60-89 ([place])    Labs  ordered today.  Extensive conversation had regarding patient's kidney function.  Importance of avoiding NSAIDS and avoiding other medications that will hurt kidney function.  Labs ordered today. Will make recommendations based on lab results.  Follow up in 3 months.       Other Visit Diagnoses     Anemia due to chronic kidney disease, unspecified CKD stage       Discussed anemia secondary to CKD. Labs ordered today. Will make recommendations based on lab results.    Relevant Orders   Comp Met (CMET)   CBC w/Diff        Follow up plan: Return in about 3 months (around 05/02/2022) for HTN, HLD, DM2 FU.   A total of 30 minutes were spent on this encounter today.  When total time is documented, this includes both the face-to-face and non-face-to-face time personally spent before, during and after the visit on the date of the encounter reviewing CKD and anemia.

## 2022-02-03 LAB — CBC WITH DIFFERENTIAL/PLATELET
Basophils Absolute: 0.1 10*3/uL (ref 0.0–0.2)
Basos: 1 %
EOS (ABSOLUTE): 0.2 10*3/uL (ref 0.0–0.4)
Eos: 3 %
Hematocrit: 32.7 % — ABNORMAL LOW (ref 37.5–51.0)
Hemoglobin: 11.2 g/dL — ABNORMAL LOW (ref 13.0–17.7)
Immature Grans (Abs): 0 10*3/uL (ref 0.0–0.1)
Immature Granulocytes: 0 %
Lymphocytes Absolute: 2.1 10*3/uL (ref 0.7–3.1)
Lymphs: 23 %
MCH: 30 pg (ref 26.6–33.0)
MCHC: 34.3 g/dL (ref 31.5–35.7)
MCV: 88 fL (ref 79–97)
Monocytes Absolute: 0.7 10*3/uL (ref 0.1–0.9)
Monocytes: 8 %
Neutrophils Absolute: 6.2 10*3/uL (ref 1.4–7.0)
Neutrophils: 65 %
Platelets: 290 10*3/uL (ref 150–450)
RBC: 3.73 x10E6/uL — ABNORMAL LOW (ref 4.14–5.80)
RDW: 12.8 % (ref 11.6–15.4)
WBC: 9.4 10*3/uL (ref 3.4–10.8)

## 2022-02-03 LAB — COMPREHENSIVE METABOLIC PANEL
ALT: 6 IU/L (ref 0–44)
AST: 12 IU/L (ref 0–40)
Albumin/Globulin Ratio: 1.7 (ref 1.2–2.2)
Albumin: 4 g/dL (ref 3.8–4.9)
Alkaline Phosphatase: 86 IU/L (ref 44–121)
BUN/Creatinine Ratio: 7 — ABNORMAL LOW (ref 9–20)
BUN: 30 mg/dL — ABNORMAL HIGH (ref 6–24)
Bilirubin Total: <0.2 mg/dL (ref 0.0–1.2)
CO2: 19 mmol/L — ABNORMAL LOW (ref 20–29)
Calcium: 8.8 mg/dL (ref 8.7–10.2)
Chloride: 110 mmol/L — ABNORMAL HIGH (ref 96–106)
Creatinine, Ser: 4.04 mg/dL — ABNORMAL HIGH (ref 0.76–1.27)
Globulin, Total: 2.4 g/dL (ref 1.5–4.5)
Glucose: 122 mg/dL — ABNORMAL HIGH (ref 70–99)
Potassium: 4.6 mmol/L (ref 3.5–5.2)
Sodium: 141 mmol/L (ref 134–144)
Total Protein: 6.4 g/dL (ref 6.0–8.5)
eGFR: 17 mL/min/{1.73_m2} — ABNORMAL LOW (ref >59)

## 2022-02-05 NOTE — Addendum Note (Signed)
Addended by: Jon Billings on: 02/05/2022 09:39 AM   Modules accepted: Orders

## 2022-02-05 NOTE — Progress Notes (Signed)
Please let patient know that his creatinine increased some from prior.  I would like him to come back and repeat the labs in two weeks.  I will also send these to the kidney doctor so they can review them also.  There is nothing to be done at this time. We will continue to monitor it.  Please make him a lab appt.

## 2022-02-06 ENCOUNTER — Other Ambulatory Visit: Payer: Self-pay

## 2022-02-06 ENCOUNTER — Ambulatory Visit (INDEPENDENT_AMBULATORY_CARE_PROVIDER_SITE_OTHER): Payer: Medicare Other | Admitting: Cardiovascular Disease

## 2022-02-06 ENCOUNTER — Encounter: Payer: Self-pay | Admitting: Cardiovascular Disease

## 2022-02-06 VITALS — BP 170/100 | HR 69 | Ht 67.5 in | Wt 206.2 lb

## 2022-02-06 DIAGNOSIS — I10 Essential (primary) hypertension: Secondary | ICD-10-CM | POA: Diagnosis not present

## 2022-02-06 DIAGNOSIS — I248 Other forms of acute ischemic heart disease: Secondary | ICD-10-CM | POA: Diagnosis not present

## 2022-02-06 DIAGNOSIS — I130 Hypertensive heart and chronic kidney disease with heart failure and stage 1 through stage 4 chronic kidney disease, or unspecified chronic kidney disease: Secondary | ICD-10-CM

## 2022-02-06 MED ORDER — HYDRALAZINE HCL 50 MG PO TABS
50.0000 mg | ORAL_TABLET | Freq: Two times a day (BID) | ORAL | 5 refills | Status: DC
Start: 2022-02-06 — End: 2022-05-29

## 2022-02-06 NOTE — Patient Instructions (Signed)
Medication Instructions:  Your physician has recommended you make the following change in your medication:   START Hydralazine 50 mg twice a day. An Rx has been sent to your pharmacy.  *If you need a refill on your cardiac medications before your next appointment, please call your pharmacy*   Lab Work: None ordered  If you have labs (blood work) drawn today and your tests are completely normal, you will receive your results only by: [place] (if you have MyChart) OR A paper copy in the mail If you have any lab test that is abnormal or we need to change your treatment, we will call you to review the results.   Testing/Procedures: None ordered   Follow-Up: At [facility], you and your health needs are our priority.  As part of our continuing mission to provide you with exceptional heart care, we have created designated Provider Care Teams.  These Care Teams include your primary Cardiologist (physician) and Advanced Practice Providers (APPs -  Physician Assistants and Nurse Practitioners) who all work together to provide you with the care you need, when you need it.  We recommend signing up for the patient portal called "MyChart".  Sign up information is provided on this After Visit Summary.  MyChart is used to connect with patients for Virtual Visits (Telemedicine).  Patients are able to view lab/test results, encounter notes, upcoming appointments, etc.  Non-urgent messages can be sent to your provider as well.   To learn more about what you can do with MyChart, go to [email or web address].    Your next appointment:   3 month(s)  The format for your next appointment:   In Person  Provider:   You may see Kathlyn Sacramento, MD or one of the following Advanced Practice Providers on your designated Care Team:   Murray Hodgkins, NP Christell Faith, PA-C Cadence Kathlen Mody, [place]   Other Instructions N/A

## 2022-02-06 NOTE — Progress Notes (Signed)
Cardiology Office Note   Date:  02/06/2022   ID:  Derek Cooley., DOB 1966-04-26, MRN [id number]  PCP:  Jon Billings, NP  Cardiologist:   Kathlyn Sacramento, MD   Chief Complaint  Patient presents with   Other    2-3 wk f/u no complaints today. Meds reviewed verbally with pt.      History of Present Illness: Derek Cooley. is a 56 y.o. male who presents for a follow-up visit regarding essential hypertension. He has known history of hypertension, diabetes mellitus, subarachnoid hemorrhage status postcraniotomy and clipping, sleep apnea, obesity, stage IV chronic kidney disease, abdominal abscess with chronic MRSA infection and polysubstance abuse. He was hospitalized in December with shortness of breath and was found to be severely hypertensive.  Echocardiogram showed normal LV systolic function with moderate LVH.  He was discharged home on amlodipine and carvedilol.  He was rehospitalized shortly after that due to rhinovirus infection and worsening renal function.  He was not compliant with his medications.  He drinks 2 to 3 40 ounce beers most days of the week.  He has memory issues related to his previous subarachnoid hemorrhage and craniotomy.  He frequently forgets to take some of his blood pressure medications.  He is accompanied today by his sister to assist with his medication but she is not with him over the time.  He reports shortness of breath when his blood pressure is elevated.   Past Medical History:  Diagnosis Date   Abdominal abscess    Acute pancreatitis    Anxiety    Asthma    Back pain    Chronic heart failure with preserved ejection fraction (HFpEF) ([place])    a. 11/2021 Echo: EF 55-60%, no rwma, mod LVH, GrI DD. Nl RV size/fxn. Mildly dil LA.   CKD (chronic kidney disease), stage IV (HCC)    Depression    Emphysema of lung ([place])    right sided   GERD (gastroesophageal reflux disease)    Hemorrhage into subarachnoid space of neuraxis ([place]) 01/26/2010    Hypertension    Mild cognitive impairment    MRSA (methicillin resistant Staphylococcus aureus)    Obesity    Panic disorder    Perforated bowel ([place]) 12/10/2005   Tempoary Colostomy Bag, Skin Graft for Abd wound   Polysubstance abuse ([place])    Sleep apnea    sleep study 2013   Subarachnoid hemorrhage ([place]) 12/10/2009   coil placed   Tobacco abuse    Type 2 diabetes mellitus without complication, without long-term current use of insulin ([place]) 04/17/2016   Vitreous hemorrhage ([place]) 03/27/2011   Overview:  Bilateral; 01/2010, from [facility]     Past Surgical History:  Procedure Laterality Date   COLON SURGERY  2007   colostomy bag placed s/p perforated bowel   COLONOSCOPY WITH PROPOFOL N/A 05/18/2020   Procedure: COLONOSCOPY WITH PROPOFOL;  Surgeon: Lin Landsman, MD;  [facility] END[facility];  Service: Gastroenterology;  Laterality: N/A;   KNEE SURGERY Right    Perforated bowel       Current Outpatient Medications  Medication Sig Dispense Refill   albuterol (VENTOLIN HFA) 108 (90 Base) MCG/ACT inhaler Inhale 1-2 puffs into the lungs every 6 (six) hours as needed for wheezing or shortness of breath. 18 g 2   amLODipine (NORVASC) 10 MG tablet Take 1 tablet (10 mg total) by mouth daily. 90 tablet 2   budesonide-formoterol (SYMBICORT) 160-4.5 MCG/ACT inhaler Inhale 2 puffs into the lungs in  the morning and at bedtime. 10.2 each 12   calcitRIOL (ROCALTROL) 0.25 MCG capsule Take 0.25 mcg by mouth daily.     carvedilol (COREG) 25 MG tablet Take 1 tablet (25 mg total) by mouth 2 (two) times daily with a meal. 60 tablet 3   diclofenac Sodium (VOLTAREN) 1 % GEL Apply 2 g topically 4 (four) times daily. 100 g 0   DULoxetine (CYMBALTA) 60 MG capsule Take 1 capsule (60 mg total) by mouth daily. 30 capsule 2   fenofibrate micronized (LOFIBRA) 67 MG capsule TAKE 1 CAPSULE EVERY DAY BEFORE BREAKFAST 90 capsule 2   glucose blood (ONETOUCH ULTRA) test strip USE TO TEST BLOOD SUGAR DAILY 100 each  3   hydrOXYzine (ATARAX) 50 MG tablet TAKE ONE TABLET 3 TIMES DAILY AS NEEDED 90 tablet 1   lidocaine (LIDODERM) 5 % Place 1 patch onto the skin every 12 (twelve) hours. Remove & Discard patch within 12 hours or as directed by MD 15 patch 0   lisinopril (ZESTRIL) 10 MG tablet Take 1 tablet (10 mg total) by mouth daily. 30 tablet 0   pantoprazole (PROTONIX) 40 MG tablet Take 1 tablet (40 mg total) by mouth 2 (two) times daily as needed. 60 tablet 2   pregabalin (LYRICA) 50 MG capsule Take 1 capsule (50 mg total) by mouth 2 (two) times daily. 60 capsule 2   topiramate (TOPAMAX) 50 MG tablet Take 1 tablet (50 mg total) by mouth 2 (two) times daily. 60 tablet 1   traZODone (DESYREL) 100 MG tablet Take 100 mg by mouth at bedtime.     Current Facility-Administered Medications  Medication Dose Route Frequency Provider Last Rate Last Admin   cloNIDine (CATAPRES) tablet 0.1 mg  0.1 mg Oral Once Theora Gianotti, NP        Allergies:   Atorvastatin, Avelox [moxifloxacin hcl in nacl], Buprenorphine, Dilaudid [hydromorphone hcl], Fluoxetine, Levofloxacin, Morphine and related, Other, Suboxone [buprenorphine hcl-naloxone hcl], and Vancomycin    Social History:  The patient  reports that he has been smoking cigarettes. He started smoking about 37 years ago. He has been smoking an average of .5 packs per day. He has never used smokeless tobacco. He reports that he does not currently use alcohol. He reports that he does not currently use drugs after having used the following drugs: "Crack" cocaine, Amphetamines, and Methamphetamines.   Family History:  The patient's family history includes Anxiety disorder in his brother, father, mother, sister, and sister; Arthritis in his mother and sister; Asthma in his mother and sister; Depression in his brother, father, mother, sister, and sister; Diabetes in his father, mother, and sister; Heart disease in his brother, father, mother, and sister; Hyperlipidemia in  his brother, mother, and sister; Hypertension in his brother, mother, and sister; Kidney disease in his mother; Lung disease in his mother and sister; Thyroid disease in his mother.    ROS:  Please see the history of present illness.   Otherwise, review of systems are positive for none.   All other systems are reviewed and negative.    PHYSICAL EXAM: VS:  BP (!) 170/100 (BP Location: Left Arm, Patient Position: Sitting, Cuff Size: Normal)    Pulse 69    Ht 5' 7.5" (1.715 m)    Wt 206 lb 4 oz (93.6 kg)    SpO2 98%    BMI 31.83 kg/m  , BMI Body mass index is 31.83 kg/m. GEN: Well nourished, well developed, in no acute distress  HEENT:  normal  Neck: no JVD, carotid bruits, or masses Cardiac: RRR; no murmurs, rubs, or gallops,no edema  Respiratory:  clear to auscultation bilaterally, normal work of breathing GI: soft, nontender, nondistended, + BS MS: no deformity or atrophy  Skin: warm and dry, no rash Neuro:  Strength and sensation are intact Psych: euthymic mood, full affect   EKG:  EKG is not ordered today.    Recent Labs: 11/25/2021: B Natriuretic Peptide 549.9 11/26/2021: Magnesium 2.0 02/02/2022: ALT 6; BUN 30; Creatinine, Ser 4.04; Hemoglobin 11.2; Platelets 290; Potassium 4.6; Sodium 141    Lipid Panel    Component Value Date/Time   CHOL 161 04/17/2021 1548   TRIG 187 (H) 04/17/2021 1548   HDL 24 (L) 04/17/2021 1548   CHOLHDL 6.7 (H) 04/17/2021 1548   LDLCALC 104 (H) 04/17/2021 1548      Wt Readings from Last 3 Encounters:  02/06/22 206 lb 4 oz (93.6 kg)  02/02/22 209 lb (94.8 kg)  01/22/22 205 lb 12.8 oz (93.4 kg)       No flowsheet data found.    ASSESSMENT AND PLAN:  1.  Essential hypertension: Significant fluctuation in blood pressure likely due to inconsistently taking blood pressure medications.  For that reason, clonidine is likely not a good option long-term.  Currently, he is on amlodipine, carvedilol and small dose lisinopril.  ACE  inhibitor/ARB's are limited by advanced chronic kidney disease. I elected to add hydralazine 50 mg twice daily.  His sister reports inability to take 3 times daily medications  2.  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  He is not on any diuretics.  3.  Elevated troponin thought to be due to demand ischemia: in the setting of severely elevated blood pressure and hypertensive heart disease.  We will consider a Lexiscan Myoview once his blood pressure is controlled.  4.  Polysubstance abuse: He denies recent recreational drug use.  He continues to smoke and drink alcohol.  5.  Sleep apnea: Not compliant with CPAP.    Disposition:   FU in 3 months.  Signed,  Kathlyn Sacramento, MD  02/06/2022 4:45 PM    [facility]

## 2022-02-12 ENCOUNTER — Ambulatory Visit: Payer: Medicare Other | Admitting: Nurse Practitioner

## 2022-02-19 ENCOUNTER — Other Ambulatory Visit: Payer: Medicare Other

## 2022-02-20 ENCOUNTER — Other Ambulatory Visit: Payer: Medicare Other

## 2022-02-20 ENCOUNTER — Other Ambulatory Visit: Payer: Self-pay

## 2022-02-20 ENCOUNTER — Other Ambulatory Visit: Payer: Medicare Other | Admitting: Primary Care

## 2022-02-20 DIAGNOSIS — J441 Chronic obstructive pulmonary disease with (acute) exacerbation: Secondary | ICD-10-CM

## 2022-02-20 DIAGNOSIS — I10 Essential (primary) hypertension: Secondary | ICD-10-CM

## 2022-02-20 DIAGNOSIS — I129 Hypertensive chronic kidney disease with stage 1 through stage 4 chronic kidney disease, or unspecified chronic kidney disease: Secondary | ICD-10-CM

## 2022-02-20 DIAGNOSIS — D631 Anemia in chronic kidney disease: Secondary | ICD-10-CM

## 2022-02-20 DIAGNOSIS — N179 Acute kidney failure, unspecified: Secondary | ICD-10-CM

## 2022-02-20 DIAGNOSIS — I5031 Acute diastolic (congestive) heart failure: Secondary | ICD-10-CM

## 2022-02-20 DIAGNOSIS — Z515 Encounter for palliative care: Secondary | ICD-10-CM

## 2022-02-20 NOTE — Progress Notes (Signed)
? ? ?Manufacturing engineer ?Community Palliative Care Consult Note ?Telephone: [phone]  ?Fax: [phone]  ? ? ?Date of encounter: 02/20/22 ?12:15 PM ?PATIENT NAME: Derek Cooley. ?[address] ?[place] [address]   ?[phone] (home)  ?DOB: Sep 02, 1966 ?MRN: [id number] ?PRIMARY CARE PROVIDER:    ?Derek Billings, NP,  ?[address] ?[place] [address] ?[phone] ? ?REFERRING PROVIDER:   ?Derek Billings, NP ?[address] ?[place],  [place] [address] ?[phone] ? ?RESPONSIBLE PARTY:    ?Contact Information   ? ? Name Relation Home Work Mobile  ? Derek Cooley Son   [phone]  ? Derek Cooley Sister   [phone]  ? Derek Cooley Daughter   [phone]  ? ?  ? ? ? ?I met face to face with patient and family in  home. Palliative Care was asked to follow this patient by consultation request of  Derek Billings, NP to address advance care planning and complex medical decision making. This is a follow up visit. ? ?                                 ASSESSMENT AND PLAN / RECOMMENDATIONS:  ? ?Advance Care Planning/Goals of Care: Goals include to maximize quality of life and symptom management. Patient/health care surrogate gave his/her permission to discuss.Our advance care planning conversation included a discussion about:    ?The value and importance of advance care planning  ?Experiences with loved ones who have been seriously ill or have died  ?Exploration of personal, cultural or spiritual beliefs that might influence medical decisions  ?Exploration of goals of care in the event of a sudden injury or illness - Discussed with family in home. ?Identification of a healthcare agent - Derek as son, given POA information in 5 wishes to complete. Derek's mother was in attendance and agrees to be second POA. ?creation of an  advance directive document - replaced previous MOST. Reviewed all questions.  ?CODE STATUS: FULL CODE ? ?I completed a MOST form today. The patient and family  outlined their wishes for the following treatment decisions: ? ?Cardiopulmonary Resuscitation: Attempt Resuscitation (CPR)  ?Medical Interventions: Full Scope of Treatment: Use intubation, advanced airway interventions, mechanical ventilation, cardioversion as indicated, medical treatment, IV fluids, etc, also provide comfort measures. Transfer to the hospital if indicated  ?Antibiotics: Antibiotics if indicated  ?IV Fluids: IV fluids for a defined trial period  ?Feeding Tube: Feeding tube for a defined trial period  ? ?I spent 16 minutes providing this consultation. More than 50% of the time in this consultation was spent in counseling and care coordination. ?------------------------------------------------------------------------------------------------------------------------ ? ?Symptom Management/Plan: ? ?Medication management:  discussed medications, has poor compliance and often misses doses. Discussed his part going forward to help compliance. He has memory loss also which impairs his ability to take medication We discussed family also cuing him. All are in agreement. Has not taken medication today. He sister keeps his medication, other of it are at a home he stays at some times. Living arrangement is definitely impacting his ability to manage bp and mood symptoms. ? ?HTN:  Today his bp is 190/110. I administered his AM  medication and asked family to administer 30 more mg lisinopril. We reviewed going to ED but family can monitor bp and I've asked them to do so and see if bp comes down. If not he should present to ED. I do feel his bp can be managed in  the community with additional caregiving, but his care giver situation will need to be consistent in order to manage his symptoms more consistently. I recommend family take meds  to [facility] or similar pharmacy which does packaging of medications. He would be able to transport packets with him. Pt. was in agreement. ? ?Housing insecurity: Discussed he is  not settled anywhere and is in and out of family home. Gave resources of Medicaid for ALF and [employer]. Discussed going to DSS to discuss his medicaid application for ALF.  ? ?Renal: Ex wife Derek Cooley states Dr Derek Cooley will manage meds due to renal dosing. Ckd  stage 4. ?Labs reviewed. ? ?Psychiatry: suggested to return to [facility] and present for neuropsychiatry. Ex wife will f/u. They feel his mood needs to be addressed as some days his depression impacts self care. He is already known to [facility] for neuro and psych. ? ?Follow up Palliative Care Visit: Palliative care will continue to follow for complex medical decision making, advance care planning, and clarification of goals. Return 6 weeks or prn. ? ? ?This visit was coded based on medical decision making (MDM). ? ?PPS: 50% ? ?HOSPICE ELIGIBILITY/DIAGNOSIS: no ? ?Chief Complaint: housing insecurity,care deficits, HTN exacerbation ? ?HISTORY OF PRESENT ILLNESS:  Derek Cooley. is a 56 y.o. year old male  with h/o aneurysm, Renal failure, HTN uncontrolled, mood disorder. Presented with HTN and .medication non compliance. Patient seen today to review palliative care needs to include medical decision making and advance care planning as appropriate.  ? ?History obtained from review of EMR, discussion with primary team, and interview with family, facility staff/caregiver and/or Derek Cooley.  ?I reviewed available labs, medications, imaging, studies and related documents from the EMR.  Records reviewed and summarized above.  ? ?ROS ? ?General: NAD ?EYES: denies vision changes ?ENMT: denies dysphagia, endorses head ache, has aneurysm ?Cardiovascular: denies chest pain, denies DOE ?Pulmonary: denies cough, denies increased SOB ?Abdomen: endorses good appetite, denies constipation, endorses continence of bowel ?GU: denies dysuria, endorses continence of urine ?MSK:  denies  increased weakness,  no falls reported ?Skin: denies rashes or wounds ?Neurological: denies pain,  denies insomnia ?Psych: Endorses positive mood ? ?Physical Exam: ?Current and past weights: ?Constitutional: NAD ?General: frail appearing, thin/WNWD/obese  ?EYES: anicteric sclera, lids intact, no discharge  ?ENMT: intact hearing, oral mucous membranes moist, dentition intact ?CV: S1S2, RRR, no LE edema ?Pulmonary: LCTA, no increased work of breathing, no cough, room air ?Abdomen: intake 100%, normo-active BS + 4 quadrants, soft and non tender, no ascites ?MSK: no sarcopenia, moves all extremities, ambulatory ?Skin: warm and dry, no rashes or wounds on visible skin ?Neuro:  no generalized weakness,  no cognitive impairment, non-anxious affect ? ? ?Thank you for the opportunity to participate in the care of Derek Cooley.  The palliative care team will continue to follow. Please call our office at [phone] if we can be of additional assistance.  ? ?Jason Coop, NP DNP, AGPCNP-BC ? ?COVID-19 PATIENT SCREENING TOOL ?Asked and negative response unless otherwise noted:  ? ?Have you had symptoms of covid, tested positive or been in contact with someone with symptoms/positive test in the past 5-10 days?  ? ?

## 2022-02-21 LAB — COMPREHENSIVE METABOLIC PANEL
ALT: 11 IU/L (ref 0–44)
AST: 14 IU/L (ref 0–40)
Albumin/Globulin Ratio: 2 (ref 1.2–2.2)
Albumin: 4.2 g/dL (ref 3.8–4.9)
Alkaline Phosphatase: 90 IU/L (ref 44–121)
BUN/Creatinine Ratio: 11 (ref 9–20)
BUN: 52 mg/dL — ABNORMAL HIGH (ref 6–24)
Bilirubin Total: <0.2 mg/dL (ref 0.0–1.2)
CO2: 17 mmol/L — ABNORMAL LOW (ref 20–29)
Calcium: 8.8 mg/dL (ref 8.7–10.2)
Chloride: 111 mmol/L — ABNORMAL HIGH (ref 96–106)
Creatinine, Ser: 4.59 mg/dL — ABNORMAL HIGH (ref 0.76–1.27)
Globulin, Total: 2.1 g/dL (ref 1.5–4.5)
Glucose: 145 mg/dL — ABNORMAL HIGH (ref 70–99)
Potassium: 5.5 mmol/L — ABNORMAL HIGH (ref 3.5–5.2)
Sodium: 142 mmol/L (ref 134–144)
Total Protein: 6.3 g/dL (ref 6.0–8.5)
eGFR: 14 mL/min/{1.73_m2} — ABNORMAL LOW (ref >59)

## 2022-02-21 NOTE — Progress Notes (Signed)
Please let patient know that his lab work shows that his kidney function has declined further.  Please make sure he has an appointment with the Kidney doctor coming up.  He needs to make sure to follow up with them.

## 2022-04-23 ENCOUNTER — Other Ambulatory Visit: Payer: Medicare Other | Admitting: Primary Care

## 2022-04-23 DIAGNOSIS — Z515 Encounter for palliative care: Secondary | ICD-10-CM

## 2022-04-23 DIAGNOSIS — N179 Acute kidney failure, unspecified: Secondary | ICD-10-CM

## 2022-04-23 DIAGNOSIS — F1910 Other psychoactive substance abuse, uncomplicated: Secondary | ICD-10-CM

## 2022-04-23 DIAGNOSIS — I5031 Acute diastolic (congestive) heart failure: Secondary | ICD-10-CM

## 2022-04-23 NOTE — Progress Notes (Signed)
? ? ?Manufacturing engineer ?Community Palliative Care Consult Note ?Telephone: [phone]  ?Fax: [phone]  ? ? ?Date of encounter: 04/23/22 ?1:08 PM ?PATIENT NAME: Derek Cooley. ?[address] ?[place] [address]   ?[phone] (home)  ?DOB: [birth year] ?MRN: [id number] ?PRIMARY CARE PROVIDER:    ?Derek Billings, NP,  ?[address] ?[place] [address] ?[phone] ? ?REFERRING PROVIDER:   ?Derek Billings, NP ?[address] ?[place],  [place] [address] ?[phone] ? ?RESPONSIBLE PARTY:    ?Contact Information   ? ? Name Relation Home Work Mobile  ? DerekLandon Son   [phone]  ? Derek Cooley Sister   [phone]  ? Derek Cooley Daughter   [phone]  ? ?  ? ? ?I met face to face with patient and family in  home .Palliative Care was asked to follow this patient by consultation request of  Derek Billings, NP to address advance care planning and complex medical decision making. This is a follow up visit. ? ?                                 ASSESSMENT AND PLAN / RECOMMENDATIONS:  ? ?Advance Care Planning/Goals of Care: Goals include to maximize quality of life and symptom management. Patient/health care surrogate gave his/her permission to discuss.Our advance care planning conversation included a discussion about:    ? ?Exploration of personal, cultural or spiritual beliefs that might influence medical decisions  ?Plans to begin dialysis and states he is not ready to die yet. ?CODE STATUS: FULL CODE ? ?Symptom Management/Plan: ? ?HTN: much reduced today at 99/79. Reports feeling well. Denies taking medications and states these have been misplaced. States he is not feeling like he has HTN episodes as high as he was traditionally running. ? ?Housing insecurity: continues to live in a camper and comes to sister's home to meet me. States he is doing well and enjoys the camper. ? ?ESRD: States he has decided to begin dialysis, and has appt with surgeon for 2-3 weeks to get access  graft initiated. Discussed this therapy and what he would need to do to be successful. ? ?Medication management; Not clear if he has access to his current medications, may need SW to follow  at HD clinic once established. ? ?Follow up Palliative Care Visit: Palliative care will continue to follow for complex medical decision making, advance care planning, and clarification of goals. Return 12 weeks or prn. ? ?This visit was coded based on medical decision making (MDM). ? ?PPS: 60% ? ?HOSPICE ELIGIBILITY/DIAGNOSIS: TBD ? ?Chief Complaint: ESRD, self care deficits ? ?HISTORY OF PRESENT ILLNESS:  Derek Cooley. is a 56 y.o. year old male  with h/o SUD, ESRD, HTN, CHF, COPD, cognitive impairment . Patient seen today to review palliative care needs to include medical decision making and advance care planning as appropriate.  ? ?History obtained from review of EMR, discussion with primary team, and interview with family, facility staff/caregiver and/or Derek Cooley.  ?I reviewed available labs, medications, imaging, studies and related documents from the EMR.  Records reviewed and summarized above.  ? ?ROS ? ? ?General: NAD ?ENMT: denies dysphagia ?Cardiovascular: denies chest pain, denies DOE ?Pulmonary: denies cough, denies increased SOB ?Abdomen: endorses good appetite, denies constipation, endorses continence of bowel ?GU: denies dysuria, endorses continence of urine ?MSK:  denies  increased weakness,  no falls reported ?Skin: denies rashes or wounds ?Neurological: denies pain,  denies insomnia ?Psych: Endorses positive mood ? ?Physical Exam: ?Current and past weights: stable  ?Constitutional: NAD ?EYES: anicteric sclera, lids intact, no discharge  ?ENMT: intact hearing, oral mucous membranes moist, dentition intact ?CV: S1S2, RRR, no LE edema ?Pulmonary: LCTA, no increased work of breathing, no cough, room air ?Abdomen: intake 100%, normo-active BS + 4 quadrants, soft and non tender, no ascites ?MSK: no sarcopenia,  moves all extremities, ambulatory ?Skin: warm and dry, no rashes or wounds on visible skin ?Neuro:  no generalized weakness,  mild  cognitive impairment, non-anxious affect ? ? ?Thank you for the opportunity to participate in the care of Derek Cooley.  The palliative care team will continue to follow. Please call our office at [phone] if we can be of additional assistance.  ? ?Derek Coop, NP DNP, AGPCNP-BC ? ?COVID-19 PATIENT SCREENING TOOL ?Asked and negative response unless otherwise noted:  ? ?Have you had symptoms of covid, tested positive or been in contact with someone with symptoms/positive test in the past 5-10 days?  ? ?

## 2022-04-30 ENCOUNTER — Ambulatory Visit: Payer: Medicare Other | Admitting: Nurse Practitioner

## 2022-04-30 NOTE — Progress Notes (Deleted)
There were no vitals taken for this visit.   Subjective:    Patient ID: Derek Cooley., male    DOB: 10/08/1966, 56 y.o.   MRN: [id number]  HPI: Derek Cooley. is a 56 y.o. male  No chief complaint on file.  HYPERTENSION / HYPERLIPIDEMIA Satisfied with current treatment? no Duration of hypertension: years BP monitoring frequency: not checking BP range:  BP medication side effects: no Past BP meds: lisinopril Duration of hyperlipidemia: years Cholesterol medication side effects: no Cholesterol supplements: none Past cholesterol medications: none Medication compliance: excellent compliance Aspirin: no Recent stressors: no Recurrent headaches: no Visual changes: no Palpitations: no Dyspnea: no Chest pain: no Lower extremity edema: no Dizzy/lightheaded: no   CHRONIC KIDNEY DISEASE CKD status: controlled Medications renally dose: yes Previous renal evaluation: yes Pneumovax:  Not up to Date Influenza Vaccine:  Not up to Date   Relevant past medical, surgical, family and social history reviewed and updated as indicated. Interim medical history since our last visit reviewed. Allergies and medications reviewed and updated.  Review of Systems  Eyes:  Negative for visual disturbance.  Respiratory:  Negative for chest tightness and shortness of breath.   Cardiovascular:  Negative for chest pain, palpitations and leg swelling.  Endocrine: Negative for polydipsia and polyuria.  Neurological:  Negative for dizziness, light-headedness, numbness and headaches.   Per HPI unless specifically indicated above     Objective:    There were no vitals taken for this visit.  Wt Readings from Last 3 Encounters:  02/06/22 206 lb 4 oz (93.6 kg)  02/02/22 209 lb (94.8 kg)  01/22/22 205 lb 12.8 oz (93.4 kg)    Physical Exam Vitals and nursing note reviewed.  Constitutional:      General: He is not in acute distress.    Appearance: Normal appearance. He is not  ill-appearing, toxic-appearing or diaphoretic.  HENT:     Head: Normocephalic.     Right Ear: External ear normal.     Left Ear: External ear normal.     Nose: Nose normal. No congestion or rhinorrhea.     Mouth/Throat:     Mouth: Mucous membranes are moist.  Eyes:     General:        Right eye: No discharge.        Left eye: No discharge.     Extraocular Movements: Extraocular movements intact.     Conjunctiva/sclera: Conjunctivae normal.     Pupils: Pupils are equal, round, and reactive to light.  Cardiovascular:     Rate and Rhythm: Normal rate and regular rhythm.     Heart sounds: No murmur heard. Pulmonary:     Effort: Pulmonary effort is normal. No respiratory distress.     Breath sounds: Normal breath sounds. No wheezing, rhonchi or rales.  Abdominal:     General: Abdomen is flat. Bowel sounds are normal.  Musculoskeletal:     Cervical back: Normal range of motion and neck supple.  Skin:    General: Skin is warm and dry.     Capillary Refill: Capillary refill takes less than 2 seconds.  Neurological:     General: No focal deficit present.     Mental Status: He is alert and oriented to person, place, and time.  Psychiatric:        Mood and Affect: Mood normal.        Behavior: Behavior normal.        Thought Content: Thought content normal.  Judgment: Judgment normal.    Results for orders placed or performed in visit on 02/20/22  Comp Met (CMET)  Result Value Ref Range   Glucose 145 (H) 70 - 99 mg/dL   BUN 52 (H) 6 - 24 mg/dL   Creatinine, Ser 4.59 (H) 0.76 - 1.27 mg/dL   eGFR 14 (L) >59 mL/min/1.73   BUN/Creatinine Ratio 11 9 - 20   Sodium 142 134 - 144 mmol/L   Potassium 5.5 (H) 3.5 - 5.2 mmol/L   Chloride 111 (H) 96 - 106 mmol/L   CO2 17 (L) 20 - 29 mmol/L   Calcium 8.8 8.7 - 10.2 mg/dL   Total Protein 6.3 6.0 - 8.5 g/dL   Albumin 4.2 3.8 - 4.9 g/dL   Globulin, Total 2.1 1.5 - 4.5 g/dL   Albumin/Globulin Ratio 2.0 1.2 - 2.2   Bilirubin Total <0.2  0.0 - 1.2 mg/dL   Alkaline Phosphatase 90 44 - 121 IU/L   AST 14 0 - 40 IU/L   ALT 11 0 - 44 IU/L      Assessment & Plan:   Problem List Items Addressed This Visit      Cardiovascular and Mediastinum   Hypertension associated with diabetes ([facility]) - Primary     Respiratory   COPD (chronic obstructive pulmonary disease) (HCC)     Endocrine   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   A total of 30 minutes were spent on this encounter today.  When total time is documented, this includes both the face-to-face and non-face-to-face time personally spent before, during and after the visit on the date of the encounter reviewing CKD and anemia.

## 2022-05-15 ENCOUNTER — Ambulatory Visit (INDEPENDENT_AMBULATORY_CARE_PROVIDER_SITE_OTHER): Payer: Medicare Other

## 2022-05-15 ENCOUNTER — Ambulatory Visit (INDEPENDENT_AMBULATORY_CARE_PROVIDER_SITE_OTHER): Payer: Medicare Other | Admitting: Vascular Surgery

## 2022-05-15 ENCOUNTER — Encounter (INDEPENDENT_AMBULATORY_CARE_PROVIDER_SITE_OTHER): Payer: Self-pay | Admitting: Vascular Surgery

## 2022-05-15 ENCOUNTER — Other Ambulatory Visit (INDEPENDENT_AMBULATORY_CARE_PROVIDER_SITE_OTHER): Payer: Self-pay | Admitting: Nephrology

## 2022-05-15 VITALS — BP 215/105 | HR 80 | Resp 16 | Wt 218.6 lb

## 2022-05-15 DIAGNOSIS — E1122 Type 2 diabetes mellitus with diabetic chronic kidney disease: Secondary | ICD-10-CM

## 2022-05-15 DIAGNOSIS — N186 End stage renal disease: Secondary | ICD-10-CM

## 2022-05-15 DIAGNOSIS — E1159 Type 2 diabetes mellitus with other circulatory complications: Secondary | ICD-10-CM

## 2022-05-15 DIAGNOSIS — N185 Chronic kidney disease, stage 5: Secondary | ICD-10-CM | POA: Diagnosis not present

## 2022-05-15 DIAGNOSIS — E7800 Pure hypercholesterolemia, unspecified: Secondary | ICD-10-CM | POA: Diagnosis not present

## 2022-05-15 DIAGNOSIS — I152 Hypertension secondary to endocrine disorders: Secondary | ICD-10-CM

## 2022-05-15 NOTE — Progress Notes (Signed)
Patient ID: Derek Cooley., male   DOB: 04/04/1966, 56 y.o.   MRN: [id number]  No chief complaint on file.   HPI Derek Cooley. is a 56 y.o. male.  I am asked to see the patient by Dr. Cherylann Ratel for evaluation of permanent dialysis access. ***.     Past Medical History:  Diagnosis Date   Abdominal abscess    Acute pancreatitis    Anxiety    Asthma    Back pain    Chronic heart failure with preserved ejection fraction (HFpEF) (HCC)    a. 11/2021 Echo: EF 55-60%, no rwma, mod LVH, GrI DD. Nl RV size/fxn. Mildly dil LA.   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   Hypertension    Mild cognitive impairment    MRSA (methicillin resistant Staphylococcus aureus)    Obesity    Panic disorder    Perforated bowel (HCC) 12/10/2005   Tempoary Colostomy Bag, Skin Graft for Abd wound   Polysubstance abuse (HCC)    Sleep apnea    sleep study 2013   Subarachnoid hemorrhage (HCC) 12/10/2009   coil placed   Tobacco abuse    Type 2 diabetes mellitus without complication, without long-term current use of insulin (HCC) 04/17/2016   Vitreous hemorrhage (HCC) 03/27/2011   Overview:  Bilateral; 01/2010, from [facility]     Past Surgical History:  Procedure Laterality Date   COLON SURGERY  2007   colostomy bag placed s/p perforated bowel   COLONOSCOPY WITH PROPOFOL N/A 05/18/2020   Procedure: COLONOSCOPY WITH PROPOFOL;  Surgeon: Toney Reil, MD;  Location: [facility] ENDOSCOPY;  Service: Gastroenterology;  Laterality: N/A;   KNEE SURGERY Right    Perforated bowel       Family History  Problem Relation Age of Onset   Arthritis Mother    Asthma Mother    Diabetes Mother    Heart disease Mother    Hyperlipidemia Mother    Hypertension Mother    Kidney disease Mother    Thyroid disease Mother    Lung disease Mother     Anxiety disorder Mother    Depression Mother    Diabetes Father    Heart disease Father    Depression Father    Anxiety disorder Father    Arthritis Sister    Asthma Sister    Hyperlipidemia Sister    Hypertension Sister    Lung disease Sister    Anxiety disorder Sister    Depression Sister    Hyperlipidemia Brother    Hypertension Brother    Diabetes Sister    Heart disease Sister    Depression Sister    Anxiety disorder Sister    Anxiety disorder Brother    Depression Brother    Heart disease Brother      Social History   Tobacco Use   Smoking status: Every Day    Packs/day: 0.50    Types: Cigarettes    Start date: 10/11/1984   Smokeless tobacco: Never  Vaping Use   Vaping Use: Never used  Substance Use Topics   Alcohol use: Not Currently   Drug use: Not Currently    Types: "Crack" cocaine, Amphetamines, Methamphetamines    Allergies  Allergen Reactions   Atorvastatin     Joint Aches - Severe  Joint Aches - Severe Joint Aches - Severe   Avelox [Moxifloxacin Hcl In Nacl]     Muscle pain   Buprenorphine     Mouth sores, confusion, shaking   Dilaudid [Hydromorphone Hcl] Hives   Fluoxetine Itching   Levofloxacin Other (See Comments)    Joint Pain   Morphine And Related    Other     Muscle pain   Suboxone [Buprenorphine Hcl-Naloxone Hcl] Other (See Comments)    Rash and confused   Vancomycin     Renal insufficiency    Current Outpatient Medications  Medication Sig Dispense Refill   albuterol (VENTOLIN HFA) 108 (90 Base) MCG/ACT inhaler Inhale 1-2 puffs into the lungs every 6 (six) hours as needed for wheezing or shortness of breath. 18 g 2   amLODipine (NORVASC) 10 MG tablet Take 1 tablet (10 mg total) by mouth daily. 90 tablet 2   budesonide-formoterol (SYMBICORT) 160-4.5 MCG/ACT inhaler Inhale 2 puffs into the lungs in the morning and at bedtime. 10.2 each 12   calcitRIOL (ROCALTROL) 0.25 MCG capsule Take 0.25 mcg by mouth daily.     carvedilol  (COREG) 25 MG tablet Take 1 tablet (25 mg total) by mouth 2 (two) times daily with a meal. 60 tablet 3   diclofenac Sodium (VOLTAREN) 1 % GEL Apply 2 g topically 4 (four) times daily. 100 g 0   DULoxetine (CYMBALTA) 60 MG capsule Take 1 capsule (60 mg total) by mouth daily. 30 capsule 2   fenofibrate micronized (LOFIBRA) 67 MG capsule TAKE 1 CAPSULE EVERY DAY BEFORE BREAKFAST 90 capsule 2   glucose blood (ONETOUCH ULTRA) test strip USE TO TEST BLOOD SUGAR DAILY 100 each 3   hydrALAZINE (APRESOLINE) 50 MG tablet Take 1 tablet (50 mg total) by mouth 2 (two) times daily. 60 tablet 5   hydrOXYzine (ATARAX) 50 MG tablet TAKE ONE TABLET 3 TIMES DAILY AS NEEDED 90 tablet 1   lidocaine (LIDODERM) 5 % Place 1 patch onto the skin every 12 (twelve) hours. Remove & Discard patch within 12 hours or as directed by MD 15 patch 0   lisinopril (ZESTRIL) 10 MG tablet Take 1 tablet (10 mg total) by mouth daily. 30 tablet 0   pantoprazole (PROTONIX) 40 MG tablet Take 1 tablet (40 mg total) by mouth 2 (two) times daily as needed. 60 tablet 2   pregabalin (LYRICA) 50 MG capsule Take 1 capsule (50 mg total) by mouth 2 (two) times daily. 60 capsule 2   topiramate (TOPAMAX) 50 MG tablet Take 1 tablet (50 mg total) by mouth 2 (two) times daily. 60 tablet 1   traZODone (DESYREL) 100 MG tablet Take 100 mg by mouth at bedtime. (Patient not taking: Reported on 02/20/2022)     No current facility-administered medications for this visit.      REVIEW OF SYSTEMS (Negative unless checked)  Constitutional: [] Weight loss  [] Fever  [] Chills Cardiac: [] Chest pain   [] Chest pressure   [] Palpitations   [] Shortness of breath when laying flat   [] Shortness of breath at rest   [] Shortness of breath with exertion. Vascular:  [] Pain in legs with walking   [] Pain in legs at rest   [] Pain in legs when laying flat   [] Claudication   [] Pain in feet when walking  [] Pain in feet at rest  [] Pain in feet when laying flat   [] History of DVT    [] Phlebitis   [] Swelling in legs   [] Varicose veins   [] Non-healing ulcers Pulmonary:   [] Uses  home oxygen   [] Productive cough   [] Hemoptysis   [] Wheeze  [] COPD   [] Asthma Neurologic:  [] Dizziness  [] Blackouts   [] Seizures   [] History of stroke   [] History of TIA  [] Aphasia   [] Temporary blindness   [] Dysphagia   [] Weakness or numbness in arms   [] Weakness or numbness in legs Musculoskeletal:  [x] Arthritis   [] Joint swelling   [] Joint pain   [] Low back pain Hematologic:  [] Easy bruising  [] Easy bleeding   [] Hypercoagulable state   [x] Anemic  [] Hepatitis Gastrointestinal:  [] Blood in stool   [] Vomiting blood  [x] Gastroesophageal reflux/heartburn   [] Abdominal pain Genitourinary:  [x] Chronic kidney disease   [] Difficult urination  [] Frequent urination  [] Burning with urination   [] Hematuria Skin:  [] Rashes   [] Ulcers   [] Wounds Psychological:  [] History of anxiety   []  History of major depression.    Physical Exam There were no vitals taken for this visit. Gen:  WD/WN, NAD Head: [place]/AT, No temporalis wasting.  Ear/Nose/Throat: Hearing grossly intact, nares w/o erythema or drainage, oropharynx w/o Erythema/Exudate Eyes: Conjunctiva clear, sclera non-icteric  Neck: trachea midline.  No JVD.  Pulmonary:  Good air movement, respirations not labored, no use of accessory muscles  Cardiac: RRR, no JVD Vascular:  Vessel Right Left  Radial Palpable Palpable                                   Gastrointestinal:. No masses, surgical incisions, or scars. Musculoskeletal: M/S 5/5 throughout.  Extremities without ischemic changes.  No deformity or atrophy. *** edema. Neurologic: Sensation grossly intact in extremities.  Symmetrical.  Speech is fluent. Motor exam as listed above. Psychiatric: Judgment intact, Mood & affect appropriate for pt's clinical situation. Dermatologic: No rashes or ulcers noted.  No cellulitis or open wounds.    Radiology No results found.  Labs Recent Results (from  the past 2160 hour(s))  Comp Met (CMET)     Status: Abnormal   Collection Time: 02/20/22  3:01 PM  Result Value Ref Range   Glucose 145 (H) 70 - 99 mg/dL   BUN 52 (H) 6 - 24 mg/dL   Creatinine, Ser 1.61 (H) 0.76 - 1.27 mg/dL   eGFR 14 (L) >09 UE/AVW/0.98   BUN/Creatinine Ratio 11 9 - 20   Sodium 142 134 - 144 mmol/L   Potassium 5.5 (H) 3.5 - 5.2 mmol/L   Chloride 111 (H) 96 - 106 mmol/L   CO2 17 (L) 20 - 29 mmol/L   Calcium 8.8 8.7 - 10.2 mg/dL   Total Protein 6.3 6.0 - 8.5 g/dL   Albumin 4.2 3.8 - 4.9 g/dL   Globulin, Total 2.1 1.5 - 4.5 g/dL   Albumin/Globulin Ratio 2.0 1.2 - 2.2   Bilirubin Total <0.2 0.0 - 1.2 mg/dL   Alkaline Phosphatase 90 44 - 121 IU/L   AST 14 0 - 40 IU/L   ALT 11 0 - 44 IU/L    Assessment/Plan:  No problem-specific Assessment & Plan notes found for this encounter.      Festus Barren 05/15/2022, 2:04 PM   This note was created with Dragon medical transcription system.  Any errors from dictation are unintentional.

## 2022-05-15 NOTE — H&P (View-Only) (Signed)
  Patient ID: Derek S Ricci Jr., male   DOB: 05/05/1966, 56 y.o.   MRN: [id number]  No chief complaint on file.   HPI Derek S Delk Jr. is a 56 y.o. male.  I am asked to see the patient by Dr. Lateef for evaluation of permanent dialysis access. ***.     Past Medical History:  Diagnosis Date   Abdominal abscess    Acute pancreatitis    Anxiety    Asthma    Back Cooley    Chronic heart failure with preserved ejection fraction (HFpEF) (HCC)    a. 11/2021 Echo: EF 55-60%, no rwma, mod LVH, GrI DD. Nl RV size/fxn. Mildly dil LA.   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   Hypertension    Mild cognitive impairment    MRSA (methicillin resistant Staphylococcus aureus)    Obesity    Panic disorder    Perforated bowel (HCC) 12/10/2005   Tempoary Colostomy Bag, Skin Graft for Abd wound   Polysubstance abuse (HCC)    Sleep apnea    sleep study 2013   Subarachnoid hemorrhage (HCC) 12/10/2009   coil placed   Tobacco abuse    Type 2 diabetes mellitus without complication, without long-term current use of insulin (HCC) 04/17/2016   Vitreous hemorrhage (HCC) 03/27/2011   Overview:  Bilateral; 01/2010, from [facility]     Past Surgical History:  Procedure Laterality Date   COLON SURGERY  2007   colostomy bag placed s/p perforated bowel   COLONOSCOPY WITH PROPOFOL N/A 05/18/2020   Procedure: COLONOSCOPY WITH PROPOFOL;  Surgeon: Vanga, Rohini Reddy, MD;  Location: [facility] ENDOSCOPY;  Service: Gastroenterology;  Laterality: N/A;   KNEE SURGERY Right    Perforated bowel       Family History  Problem Relation Age of Onset   Arthritis Mother    Asthma Mother    Diabetes Mother    Heart disease Mother    Hyperlipidemia Mother    Hypertension Mother    Kidney disease Mother    Thyroid disease Mother    Lung disease Mother     Anxiety disorder Mother    Depression Mother    Diabetes Father    Heart disease Father    Depression Father    Anxiety disorder Father    Arthritis Sister    Asthma Sister    Hyperlipidemia Sister    Hypertension Sister    Lung disease Sister    Anxiety disorder Sister    Depression Sister    Hyperlipidemia Brother    Hypertension Brother    Diabetes Sister    Heart disease Sister    Depression Sister    Anxiety disorder Sister    Anxiety disorder Brother    Depression Brother    Heart disease Brother      Social History   Tobacco Use   Smoking status: Every Day    Packs/day: 0.50    Types: Cigarettes    Start date: 10/11/1984   Smokeless tobacco: Never  Vaping Use   Vaping Use: Never used  Substance Use Topics   Alcohol use: Not Currently   Drug use: Not Currently    Types: "Crack" cocaine, Amphetamines, Methamphetamines    Allergies  Allergen Reactions   Atorvastatin     Joint Aches - Severe   Joint Aches - Severe Joint Aches - Severe   Avelox [Moxifloxacin Hcl In Nacl]     Muscle Cooley   Buprenorphine     Mouth sores, confusion, shaking   Dilaudid [Hydromorphone Hcl] Hives   Fluoxetine Itching   Levofloxacin Other (See Comments)    Joint Cooley   Morphine And Related    Other     Muscle Cooley   Suboxone [Buprenorphine Hcl-Naloxone Hcl] Other (See Comments)    Rash and confused   Vancomycin     Renal insufficiency    Current Outpatient Medications  Medication Sig Dispense Refill   albuterol (VENTOLIN HFA) 108 (90 Base) MCG/ACT inhaler Inhale 1-2 puffs into the lungs every 6 (six) hours as needed for wheezing or shortness of breath. 18 g 2   amLODipine (NORVASC) 10 MG tablet Take 1 tablet (10 mg total) by mouth daily. 90 tablet 2   budesonide-formoterol (SYMBICORT) 160-4.5 MCG/ACT inhaler Inhale 2 puffs into the lungs in the morning and at bedtime. 10.2 each 12   calcitRIOL (ROCALTROL) 0.25 MCG capsule Take 0.25 mcg by mouth daily.     carvedilol  (COREG) 25 MG tablet Take 1 tablet (25 mg total) by mouth 2 (two) times daily with a meal. 60 tablet 3   diclofenac Sodium (VOLTAREN) 1 % GEL Apply 2 g topically 4 (four) times daily. 100 g 0   DULoxetine (CYMBALTA) 60 MG capsule Take 1 capsule (60 mg total) by mouth daily. 30 capsule 2   fenofibrate micronized (LOFIBRA) 67 MG capsule TAKE 1 CAPSULE EVERY DAY BEFORE BREAKFAST 90 capsule 2   glucose blood (ONETOUCH ULTRA) test strip USE TO TEST BLOOD SUGAR DAILY 100 each 3   hydrALAZINE (APRESOLINE) 50 MG tablet Take 1 tablet (50 mg total) by mouth 2 (two) times daily. 60 tablet 5   hydrOXYzine (ATARAX) 50 MG tablet TAKE ONE TABLET 3 TIMES DAILY AS NEEDED 90 tablet 1   lidocaine (LIDODERM) 5 % Place 1 patch onto the skin every 12 (twelve) hours. Remove & Discard patch within 12 hours or as directed by MD 15 patch 0   lisinopril (ZESTRIL) 10 MG tablet Take 1 tablet (10 mg total) by mouth daily. 30 tablet 0   pantoprazole (PROTONIX) 40 MG tablet Take 1 tablet (40 mg total) by mouth 2 (two) times daily as needed. 60 tablet 2   pregabalin (LYRICA) 50 MG capsule Take 1 capsule (50 mg total) by mouth 2 (two) times daily. 60 capsule 2   topiramate (TOPAMAX) 50 MG tablet Take 1 tablet (50 mg total) by mouth 2 (two) times daily. 60 tablet 1   traZODone (DESYREL) 100 MG tablet Take 100 mg by mouth at bedtime. (Patient not taking: Reported on 02/20/2022)     No current facility-administered medications for this visit.      REVIEW OF SYSTEMS (Negative unless checked)  Constitutional: []Weight loss  []Fever  []Chills Cardiac: []Chest Cooley   []Chest pressure   []Palpitations   []Shortness of breath when laying flat   []Shortness of breath at rest   []Shortness of breath with exertion. Vascular:  []Cooley in legs with walking   []Cooley in legs at rest   []Cooley in legs when laying flat   []Claudication   []Cooley in feet when walking  []Cooley in feet at rest  []Cooley in feet when laying flat   []History of DVT    []Phlebitis   []Swelling in legs   []Varicose veins   []Non-healing ulcers Pulmonary:   []Uses   home oxygen   []Productive cough   []Hemoptysis   []Wheeze  []COPD   []Asthma Neurologic:  []Dizziness  []Blackouts   []Seizures   []History of stroke   []History of TIA  []Aphasia   []Temporary blindness   []Dysphagia   []Weakness or numbness in arms   []Weakness or numbness in legs Musculoskeletal:  [x]Arthritis   []Joint swelling   []Joint Cooley   []Low back Cooley Hematologic:  []Easy bruising  []Easy bleeding   []Hypercoagulable state   [x]Anemic  []Hepatitis Gastrointestinal:  []Blood in stool   []Vomiting blood  [x]Gastroesophageal reflux/heartburn   []Abdominal Cooley Genitourinary:  [x]Chronic kidney disease   []Difficult urination  []Frequent urination  []Burning with urination   []Hematuria Skin:  []Rashes   []Ulcers   []Wounds Psychological:  []History of anxiety   [] History of major depression.    Physical Exam There were no vitals taken for this visit. Gen:  WD/WN, NAD Head: [place]/AT, No temporalis wasting.  Ear/Nose/Throat: Hearing grossly intact, nares w/o erythema or drainage, oropharynx w/o Erythema/Exudate Eyes: Conjunctiva clear, sclera non-icteric  Neck: trachea midline.  No JVD.  Pulmonary:  Good air movement, respirations not labored, no use of accessory muscles  Cardiac: RRR, no JVD Vascular:  Vessel Right Left  Radial Palpable Palpable                                   Gastrointestinal:. No masses, surgical incisions, or scars. Musculoskeletal: M/S 5/5 throughout.  Extremities without ischemic changes.  No deformity or atrophy. *** edema. Neurologic: Sensation grossly intact in extremities.  Symmetrical.  Speech is fluent. Motor exam as listed above. Psychiatric: Judgment intact, Mood & affect appropriate for pt's clinical situation. Dermatologic: No rashes or ulcers noted.  No cellulitis or open wounds.    Radiology No results found.  Labs Recent Results (from  the past 2160 hour(s))  Comp Met (CMET)     Status: Abnormal   Collection Time: 02/20/22  3:01 PM  Result Value Ref Range   Glucose 145 (H) 70 - 99 mg/dL   BUN 52 (H) 6 - 24 mg/dL   Creatinine, Ser 4.59 (H) 0.76 - 1.27 mg/dL   eGFR 14 (L) >59 mL/min/1.73   BUN/Creatinine Ratio 11 9 - 20   Sodium 142 134 - 144 mmol/L   Potassium 5.5 (H) 3.5 - 5.2 mmol/L   Chloride 111 (H) 96 - 106 mmol/L   CO2 17 (L) 20 - 29 mmol/L   Calcium 8.8 8.7 - 10.2 mg/dL   Total Protein 6.3 6.0 - 8.5 g/dL   Albumin 4.2 3.8 - 4.9 g/dL   Globulin, Total 2.1 1.5 - 4.5 g/dL   Albumin/Globulin Ratio 2.0 1.2 - 2.2   Bilirubin Total <0.2 0.0 - 1.2 mg/dL   Alkaline Phosphatase 90 44 - 121 IU/L   AST 14 0 - 40 IU/L   ALT 11 0 - 44 IU/L    Assessment/Plan:  No problem-specific Assessment & Plan notes found for this encounter.      Derek Cooley 05/15/2022, 2:04 PM   This note was created with Dragon medical transcription system.  Any errors from dictation are unintentional.      Potassium 5.5 (H) 3.5 - 5.2 mmol/L   Chloride 111 (H) 96 - 106 mmol/L   CO2 17 (L) 20 - 29 mmol/L   Calcium 8.8 8.7 - 10.2 mg/dL   Total Protein 6.3 6.0 - 8.5 g/dL   Albumin 4.2 3.8 - 4.9 g/dL   Globulin, Total 2.1 1.5 - 4.5 g/dL   Albumin/Globulin Ratio 2.0 1.2 - 2.2   Bilirubin Total <0.2 0.0 - 1.2 mg/dL   Alkaline Phosphatase 90 44 - 121 IU/L   AST 14 0 - 40 IU/L   ALT 11 0 - 44 IU/L    Assessment/Plan:  CKD (chronic kidney disease), stage V (HCC) Noninvasive studies today show a small right radial artery and a small right cephalic vein in the forearm with a good brachial artery and cephalic vein in the  upper arm.  On the left side, his cephalic vein is of adequate size throughout including the forearm and he has triphasic waveforms in both the radial and ulnar arteries distally.   Given this finding, the patient should have a left radiocephalic AV fistula created.  He seems highly symptomatic and he may need a catheter before this fistula is mature and ready to use.  We will try to get the left radiocephalic AV fistula scheduled for the very near future at his convenience, and we will defer placement of the catheter until we hear further from his nephrologist.  Hypertension associated with diabetes ([place]) An underlying cause of his renal failure and blood pressure control important in reducing the progression of atherosclerotic disease. On appropriate oral medications.   Diabetes ([place]) blood glucose control important in reducing the progression of atherosclerotic disease. Also, involved in wound healing. On appropriate medications.   High blood cholesterol lipid control important in reducing the progression of atherosclerotic disease. Continue statin therapy      Derek Cooley 05/16/2022, 9:40 AM   This note was created with Dragon medical transcription system.  Any errors from dictation are unintentional.

## 2022-05-16 ENCOUNTER — Telehealth (INDEPENDENT_AMBULATORY_CARE_PROVIDER_SITE_OTHER): Payer: Self-pay

## 2022-05-16 DIAGNOSIS — N185 Chronic kidney disease, stage 5: Secondary | ICD-10-CM | POA: Insufficient documentation

## 2022-05-16 NOTE — Assessment & Plan Note (Signed)
Noninvasive studies today show a small right radial artery and a small right cephalic vein in the forearm with a good brachial artery and cephalic vein in the upper arm.  On the left side, his cephalic vein is of adequate size throughout including the forearm and he has triphasic waveforms in both the radial and ulnar arteries distally.   Given this finding, the patient should have a left radiocephalic AV fistula created.  He seems highly symptomatic and he may need a catheter before this fistula is mature and ready to use.  We will try to get the left radiocephalic AV fistula scheduled for the very near future at his convenience, and we will defer placement of the catheter until we hear further from his nephrologist.

## 2022-05-16 NOTE — Assessment & Plan Note (Signed)
lipid control important in reducing the progression of atherosclerotic disease. Continue statin therapy  

## 2022-05-16 NOTE — Assessment & Plan Note (Signed)
An underlying cause of his renal failure and blood pressure control important in reducing the progression of atherosclerotic disease. On appropriate oral medications.

## 2022-05-16 NOTE — Patient Instructions (Signed)
AV Fistula Placement  Arteriovenous (AV) fistula placement is a surgical procedure to create a connection between a blood vessel that carries blood away from the heart (artery) and a blood vessel that returns blood to the heart (vein). This connection is called a fistula. It is often made in the forearm or upper arm. You may need this procedure if you are getting hemodialysis treatments for kidney disease. An AV fistula makes your vein larger and stronger over several months. This makes the vein a safe and easy spot to insert the needles that are used for hemodialysis. Tell a health care provider about: Any allergies you have. All medicines you are taking, including vitamins, herbs, eye drops, creams, and over-the-counter medicines. Any problems you or family members have had with anesthetic medicines. Any blood disorders you have. Any surgeries you have had. Any medical conditions you have or have had in the past. Whether you are pregnant or may be pregnant. What are the risks? Generally, this is a safe procedure. However, problems may occur, including: Infection. Blood clot. Reduced blood flow (stenosis). Weakening or ballooning out of the fistula (aneurysm). Bleeding. Allergic reactions to medicines. Nerve damage. Swelling near the fistula. Failure of the procedure. What happens before the procedure? Staying hydrated Follow instructions from your health care provider about hydration, which may include: Up to 2 hours before the procedure - you may continue to drink clear liquids, such as water, clear fruit juice, black coffee, and plain tea.  Eating and drinking restrictions Follow instructions from your health care provider about eating and drinking, which may include: 8 hours before the procedure - stop eating heavy meals or foods, such as meat, fried foods, or fatty foods. 6 hours before the procedure - stop eating light meals or foods, such as toast or cereal. 6 hours before the  procedure - stop drinking milk or drinks that contain milk. 2 hours before the procedure - stop drinking clear liquids. Medicines Ask your health care provider about: Changing or stopping your regular medicines. This is especially important if you are taking diabetes medicines or blood thinners. Taking medicines such as aspirin and ibuprofen. These medicines can thin your blood. Do not take these medicines unless your health care provider tells you to take them. Taking over-the-counter medicines, vitamins, herbs, and supplements. General instructions Do not use any products that contain nicotine or tobacco for at least 4 weeks before the procedure. These products include cigarettes, chewing tobacco, and vaping devices, such as e-cigarettes. If you need help quitting, ask your health care provider. Imaging tests of your arm may be done to find the best place for the fistula. Plan to have a responsible adult take you home from the hospital or clinic. Ask your health care provider: How your surgery site will be marked. What steps will be taken to help prevent infection. These steps may include: Removing hair at the surgery site. Washing skin with a germ-killing soap. Taking antibiotic medicine. What happens during the procedure? An IV will be inserted into one of your veins. You will be given one or more of the following: A medicine to help you relax (sedative). A medicine to numb the area (local anesthetic). A medicine to make you fall asleep (general anesthetic). A medicine that is injected into an area of your body to numb everything below the injection site (regional anesthetic). An incision will be made on the inner side of your arm. A vein and an artery will be opened and connected with stitches (  sutures). The incision will be closed with sutures or clips. A bandage (dressing) will be placed over the area. The procedure may vary among health care providers and hospitals. What happens  after the procedure? Your blood pressure, heart rate, breathing rate, and blood oxygen level may be monitored until you leave the hospital or clinic. Your fistula site will be checked for bleeding or swelling. You will be given pain medicine as needed. If you were given a sedative during the procedure, it can affect you for several hours. Do not drive or operate machinery until your health care provider says that it is safe. Summary Arteriovenous (AV) fistula placement is a surgical procedure to create a connection between a blood vessel that carries blood away from your heart (artery) and a blood vessel that returns blood to your heart (vein). This connection is called a fistula. Follow instructions from your health care provider about eating and drinking before the procedure. Ask your health care provider about changing or stopping your regular medicines before the procedure. This is especially important if you are taking diabetes medicines or blood thinners. Plan to have a responsible adult take you home from the hospital or clinic. This information is not intended to replace advice given to you by your health care provider. Make sure you discuss any questions you have with your health care provider. Document Revised: 07/06/2020 Document Reviewed: 07/06/2020 Elsevier Patient Education  2023 [employer]  

## 2022-05-16 NOTE — Telephone Encounter (Signed)
I attempted to contact the patient's sister to let her know the patient is scheduled with Dr. Lucky Cowboy for a right radial cephalic AVF on 48/18/56 at the [facility]. Pre-surgical instructions will be mailed.

## 2022-05-16 NOTE — Assessment & Plan Note (Signed)
blood glucose control important in reducing the progression of atherosclerotic disease. Also, involved in wound healing. On appropriate medications.  

## 2022-05-18 ENCOUNTER — Telehealth: Payer: Self-pay | Admitting: *Deleted

## 2022-05-18 ENCOUNTER — Other Ambulatory Visit (INDEPENDENT_AMBULATORY_CARE_PROVIDER_SITE_OTHER): Payer: Self-pay | Admitting: Nurse Practitioner

## 2022-05-18 ENCOUNTER — Other Ambulatory Visit: Payer: Self-pay

## 2022-05-18 ENCOUNTER — Encounter
Admission: RE | Admit: 2022-05-18 | Discharge: 2022-05-18 | Disposition: A | Discharge status: Home / Self Care | Payer: Medicare Other | Source: Ambulatory Visit | Attending: Vascular Surgery | Admitting: Vascular Surgery

## 2022-05-18 DIAGNOSIS — E1122 Type 2 diabetes mellitus with diabetic chronic kidney disease: Secondary | ICD-10-CM

## 2022-05-18 DIAGNOSIS — F1410 Cocaine abuse, uncomplicated: Secondary | ICD-10-CM

## 2022-05-18 DIAGNOSIS — N186 End stage renal disease: Secondary | ICD-10-CM

## 2022-05-18 DIAGNOSIS — I5031 Acute diastolic (congestive) heart failure: Secondary | ICD-10-CM

## 2022-05-18 MED ORDER — SODIUM CHLORIDE 0.9 % IV SOLN: INTRAVENOUS | Status: DC

## 2022-05-18 NOTE — Final Progress Note (Signed)
Per patient interview, patient last cocaine used was 2-3 months ago. So urine drug screen was ordered per protocol.

## 2022-05-18 NOTE — Telephone Encounter (Signed)
Request for pre-operative cardiac clearance Received: Today Karen Kitchens, NP  P Cv Div Preop Callback Request for pre-operative cardiac clearance:     1. What type of surgery is being performed?  ARTERIOVENOUS (AV) FISTULA CREATION ( RADIAL CEPHALIC)   2. When is this surgery scheduled?  05/24/2022     3. Type of clearance being requested (medical, pharmacy, both).  MEDICAL     4. Are there any medications that need to be held prior to surgery?  NONE   5. Practice name and name of physician performing surgery?  Performing surgeon: Dr. Leotis Pain, MD  Requesting clearance: Honor Loh, FNP-C       6. Anesthesia type (none, local, MAC, general)?  GENERAL   7. What is the office phone and fax number?    Phone: [phone]  Fax: [phone]   ATTENTION: Unable to create telephone message as per your standard workflow. Directed by [facility] providers to send requests for cardiac clearance to this pool for appropriate distribution to provider covering pre-operative clearances.   Honor Loh, MSN, APRN, FNP-C, CEN  [facility]  Peri-operative Services Nurse Practitioner  Phone: [phone]  05/18/22 5:44 PM

## 2022-05-18 NOTE — Patient Instructions (Addendum)
Your procedure is scheduled on: 05/24/2022  Report to the Registration Desk on the 1st floor of the [facility]. To find out your arrival time, please call [phone] between 1PM - 3PM on: 05/23/2022 If your arrival time is 6:00 am, do not arrive prior to that time as the [facility] entrance doors do not open until 6:00 am.  REMEMBER: Instructions that are not followed completely may result in serious medical risk, up to and including death; or upon the discretion of your surgeon and anesthesiologist your surgery may need to be rescheduled.  Do not eat food after midnight the night before surgery.  No gum chewing, lozengers or hard candies.   TAKE THESE MEDICATIONS THE MORNING OF SURGERY WITH A SIP OF WATER: amlodipine 2. Calcitrol  3. Carvedilol 4 cymbalta  5.pregabalin 6 Hydralazine 7. Protonix (take one the night before and one on the morning of surgery - helps to prevent nausea after surgery.) 8 .Topamax   Use symbicort inhaler at home  on the day of surgery and bring albuterol to the hospital.   One week prior to surgery: Stop Anti-inflammatories (NSAIDS) such as Advil, Aleve, Ibuprofen, Motrin, Naproxen, Naprosyn and Aspirin based products such as Excedrin, Goodys Powder, BC Powder. Stop ANY OVER THE COUNTER supplements until after surgery. You may however, continue to take Tylenol if needed for pain up until the day of surgery.  No Alcohol for 24 hours before or after surgery.  No Smoking including e-cigarettes for 24 hours prior to surgery.  No chewable tobacco products for at least 6 hours prior to surgery.  No nicotine patches on the day of surgery.  Do not use any "recreational" drugs or illegal drugs  for at least a week prior to your surgery.  Please be advised that the combination of cocaine and anesthesia may have negative outcomes, up to and including death. If you test positive for cocaine, your surgery will be cancelled.  On the morning of surgery brush  your teeth with toothpaste and water, you may rinse your mouth with mouthwash if you wish. Do not swallow any toothpaste or mouthwash.  Use CHG Soap as directed on instruction sheet.  Do not wear jewelry, make-up, hairpins, clips or nail polish.  Do not wear lotions, powders, or perfumes.   Do not shave body from the neck down 48 hours prior to surgery just in case you cut yourself which could leave a site for infection.  Also, freshly shaved skin may become irritated if using the CHG soap.  Contact lenses, hearing aids and dentures may not be worn into surgery.  Do not bring valuables to the hospital. [facility] is not responsible for any missing/lost belongings or valuables.    Notify your doctor if there is any change in your medical condition (cold, fever, infection).  Wear comfortable clothing (specific to your surgery type) to the hospital.  After surgery, you can help prevent lung complications by doing breathing exercises.  Take deep breaths and cough every 1-2 hours. Your doctor may order a device called an Incentive Spirometer to help you take deep breaths.  If you are being admitted to the hospital overnight, leave your suitcase in the car. After surgery it may be brought to your room.  If you are being discharged the day of surgery, you will not be allowed to drive home. You will need a responsible adult (18 years or older) to drive you home and stay with you that night.   If you  are taking public transportation, you will need to have a responsible adult (18 years or older) with you. Please confirm with your physician that it is acceptable to use public transportation.   Please call the [facility]. at [phone] if you have any questions about these instructions.  Surgery Visitation Policy:  Patients undergoing a surgery or procedure may have two family members or support persons with them as long as the person is not COVID-19 positive or  experiencing its symptoms.   Inpatient Visitation:    Visiting hours are 7 a.m. to 8 p.m. Up to four visitors are allowed at one time in a patient room, including children. The visitors may rotate out with other people during the day. One designated support person (adult) may remain overnight.

## 2022-05-21 ENCOUNTER — Telehealth: Payer: Self-pay | Admitting: *Deleted

## 2022-05-21 ENCOUNTER — Encounter
Admission: RE | Admit: 2022-05-21 | Discharge: 2022-05-21 | Disposition: A | Discharge status: Home / Self Care | Payer: Medicare Other | Source: Ambulatory Visit | Attending: Vascular Surgery | Admitting: Vascular Surgery

## 2022-05-21 DIAGNOSIS — Z01810 Encounter for preprocedural cardiovascular examination: Secondary | ICD-10-CM

## 2022-05-21 DIAGNOSIS — Z01818 Encounter for other preprocedural examination: Secondary | ICD-10-CM | POA: Insufficient documentation

## 2022-05-21 DIAGNOSIS — N186 End stage renal disease: Secondary | ICD-10-CM

## 2022-05-21 DIAGNOSIS — I5031 Acute diastolic (congestive) heart failure: Secondary | ICD-10-CM | POA: Diagnosis not present

## 2022-05-21 LAB — CBC
HCT: 35.6 % — ABNORMAL LOW (ref 39.0–52.0)
Hemoglobin: 11.9 g/dL — ABNORMAL LOW (ref 13.0–17.0)
MCH: 29.3 pg (ref 26.0–34.0)
MCHC: 33.4 g/dL (ref 30.0–36.0)
MCV: 87.7 fL (ref 80.0–100.0)
Platelets: 290 10*3/uL (ref 150–400)
RBC: 4.06 MIL/uL — ABNORMAL LOW (ref 4.22–5.81)
RDW: 13.1 % (ref 11.5–15.5)
WBC: 8.3 10*3/uL (ref 4.0–10.5)
nRBC: 0 % (ref 0.0–0.2)

## 2022-05-21 LAB — TYPE AND SCREEN
ABO/RH(D): O NEG
Antibody Screen: NEGATIVE

## 2022-05-21 LAB — BASIC METABOLIC PANEL
Anion gap: 6 (ref 5–15)
BUN: 48 mg/dL — ABNORMAL HIGH (ref 6–20)
CO2: 23 mmol/L (ref 22–32)
Calcium: 9 mg/dL (ref 8.9–10.3)
Chloride: 112 mmol/L — ABNORMAL HIGH (ref 98–111)
Creatinine, Ser: 4.53 mg/dL — ABNORMAL HIGH (ref 0.61–1.24)
GFR, Estimated: 14 mL/min — ABNORMAL LOW (ref >60)
Glucose, Bld: 92 mg/dL (ref 70–99)
Potassium: 3.9 mmol/L (ref 3.5–5.1)
Sodium: 141 mmol/L (ref 135–145)

## 2022-05-21 NOTE — Telephone Encounter (Signed)
Primary Cardiologist:Muhammad Fletcher Anon, MD  Chart reviewed as part of pre-operative protocol coverage. Because of Derek S Kueker Jr.'s past medical history and time since last visit, he/she will require a virtual visit/telephone call in order to better assess preoperative cardiovascular risk.  Pre-op covering staff: - Please contact patient, obtain consent, and schedule appointment   If applicable, this message will also be routed to pharmacy pool and/or primary cardiologist for input on holding anticoagulant/antiplatelet agent as requested below so that this information is available at time of patient's appointment.   Emmaline Life, NP-C    05/21/2022, 1:39 PM [facility] [address] Office [phone] Fax [phone]

## 2022-05-21 NOTE — Telephone Encounter (Signed)
Both the pt and his wife are agreeable to plan of care for tele pre op appt 05/22/22 @ 1:20 per pt request for time slot. Med rec and consent are done.     Patient Consent for Virtual Visit        Derek Cooley. has provided verbal consent on 05/21/2022 for a virtual visit (video or telephone).   CONSENT FOR VIRTUAL VISIT FOR:  Derek Cooley.  By participating in this virtual visit I agree to the following:  I hereby voluntarily request, consent and authorize [facility] and its employed or contracted physicians, physician assistants, nurse practitioners or other licensed health care professionals (the Practitioner), to provide me with telemedicine health care services (the "Services") as deemed necessary by the treating Practitioner. I acknowledge and consent to receive the Services by the Practitioner via telemedicine. I understand that the telemedicine visit will involve communicating with the Practitioner through live audiovisual communication technology and the disclosure of certain medical information by electronic transmission. I acknowledge that I have been given the opportunity to request an in-person assessment or other available alternative prior to the telemedicine visit and am voluntarily participating in the telemedicine visit.  I understand that I have the right to withhold or withdraw my consent to the use of telemedicine in the course of my care at any time, without affecting my right to future care or treatment, and that the Practitioner or I may terminate the telemedicine visit at any time. I understand that I have the right to inspect all information obtained and/or recorded in the course of the telemedicine visit and may receive copies of available information for a reasonable fee.  I understand that some of the potential risks of receiving the Services via telemedicine include:  Delay or interruption in medical evaluation due to technological equipment failure or  disruption; Information transmitted may not be sufficient (e.g. poor resolution of images) to allow for appropriate medical decision making by the Practitioner; and/or  In rare instances, security protocols could fail, causing a breach of personal health information.  Furthermore, I acknowledge that it is my responsibility to provide information about my medical history, conditions and care that is complete and accurate to the best of my ability. I acknowledge that Practitioner's advice, recommendations, and/or decision may be based on factors not within their control, such as incomplete or inaccurate data provided by me or distortions of diagnostic images or specimens that may result from electronic transmissions. I understand that the practice of medicine is not an exact science and that Practitioner makes no warranties or guarantees regarding treatment outcomes. I acknowledge that a copy of this consent can be made available to me via my patient portal ([facility]), or I can request a printed copy by calling the office of [facility].    I understand that my insurance will be billed for this visit.   I have read or had this consent read to me. I understand the contents of this consent, which adequately explains the benefits and risks of the Services being provided via telemedicine.  I have been provided ample opportunity to ask questions regarding this consent and the Services and have had my questions answered to my satisfaction. I give my informed consent for the services to be provided through the use of telemedicine in my medical care

## 2022-05-21 NOTE — Telephone Encounter (Signed)
Both the pt and his wife are agreeable to plan of care for tele pre op appt 05/22/22 @ 1:20 per pt request for time slot. Med rec and consent are done.

## 2022-05-22 ENCOUNTER — Encounter: Payer: Self-pay | Admitting: Nurse Practitioner

## 2022-05-22 ENCOUNTER — Encounter: Payer: Self-pay | Admitting: Vascular Surgery

## 2022-05-22 ENCOUNTER — Ambulatory Visit (INDEPENDENT_AMBULATORY_CARE_PROVIDER_SITE_OTHER): Payer: Medicare Other | Admitting: Nurse Practitioner

## 2022-05-22 DIAGNOSIS — Z01810 Encounter for preprocedural cardiovascular examination: Secondary | ICD-10-CM | POA: Diagnosis not present

## 2022-05-22 NOTE — Progress Notes (Signed)
Perioperative Services  Pre-Admission/Anesthesia Testing Clinical Review  Date: 05/23/22  Patient Demographics:  Name: Derek Cooley. DOB:   Feb 11, 1966 MRN:   [id number]  Planned Surgical Procedure(s):    Case: [id number] Date/Time: 05/24/22 1245   Procedure: ARTERIOVENOUS (AV) FISTULA CREATION ( RADIAL CEPHALIC) (Right)   Anesthesia type: General   Pre-op diagnosis: CHRONIC KIDNEY DISEASE STAGE V   Location: [facility]   Surgeons: Algernon Huxley, MD   NOTE: Available PAT nursing documentation and vital signs have been reviewed. Clinical nursing staff has updated patient's PMH/PSHx, current medication list, and drug allergies/intolerances to ensure comprehensive history available to assist in medical decision making as it pertains to the aforementioned surgical procedure and anticipated anesthetic course. Extensive review of available clinical information performed. Derek Cooley PMH and PSHx updated with any diagnoses/procedures that  may have been inadvertently omitted during his intake with the pre-admission testing department's nursing staff.  Clinical Discussion:  Derek Cooley. is a 56 y.o. male who is submitted for pre-surgical anesthesia review and clearance prior to him undergoing the above procedure. Patient is a Current Smoker.  Pertinent PMH includes: HFpEF, brain aneurysm (s/p clipping), subarachnoid hemorrhage (s/p craniotomy for coil embolization), HTN, HLD, T2DM, CKD-V, OSAH (requires nocturnal PAP therapy), GERD (on daily PPI), emphysema, asthma, chronic back pain, anxiety, panic disorder, depression, mild cognitive impairment, polysubstance abuse, under palliative care services.   Patient is followed by cardiology Fletcher Anon, MD). He was last seen in the cardiology clinic on 02/06/2022; notes reviewed.  At the time of this clinic visit, patient being seen in follow-up consult following hospital admission for shortness of breath and  significant hypertension in the setting of medication noncompliance.  Patient has memory issues, which impair his ability to remember to take his medications.  Sister involved in care patient.  He denied any PND, orthopnea, palpitations, significant peripheral edema, vertiginous symptoms, or presyncope/syncope.  Patient with past medical history significant for cardiovascular diagnoses.  Patient suffered a spontaneous rupture of a brain aneurysm back in 01/2010.  Imaging showed a SAH from the RIGHT posterior communicating artery.  He underwent coil embolization on 01/26/2010 with a known remaining neck remnant.  Neck remnant continued to enlarge requiring an RIGHT craniotomy for clip ligation on 05/14/2019.  Of note, following SAH, patient with residual cognitive impairment/memory issues.  Last TTE was performed on 11/27/2021 revealing a normal left ventricular systolic function with EF 55 to 60%.  There was moderate LVH. Diastolic Doppler parameters consistent with abnormal relaxation (G1DD).  LA mildly dilated.  There was no evidence of a significant transvalvular gradient to suggest stenosis.  Blood pressure significantly elevated at 170/100 and the cardiology clinic, however again, patient had forgotten to take his blood pressure medicines.  Patient receiving assistance with medication management from his sister, who reports that patient's blood pressure is adequately managed on prescribed CCB, beta-blocker, and vasodilator therapies.  Patient on fenofibrate therapy for his HLD diagnosis.  T2DM well-controlled with diet lifestyle modification; last HgbA1c was 5.3% when checked on 12/26/2021.  Patient has an OSAH diagnosis, however he is noncompliant with prescribed nocturnal PAP therapy.  Patient advising that he is CPAP machine was "lost or stolen", therefore he has been unable to use.  Patient was encouraged to follow-up with prescribing provider in efforts to obtain new DME. Functional capacity, as  defined by DASI, is documented as being >/= 4 METS.  No changes were made to his medication regimen.  Patient to follow-up with outpatient cardiology in 3 months or sooner if needed.  Derek Cooley. is scheduled for a RIGHT RADIOCEPHALIC ARTERIOVENOUS (AV) FISTULA CREATION on 05/24/2022 with Dr. Leotis Pain, MD. Given patient's past medical history significant for cardiovascular diagnoses, presurgical cardiac clearance was sought by the PAT team.  Per cardiology, "patient is doing well from a cardiac perspective. According to the RCRI, his perioperative risk of MACE is 6.6%. His functional capacity in METs is: 5.19 according to the DASI. Therefore, based ACC/AHA guidelines, the patient's past medical history, and the amount of time since his last clinic visit, this patient would be at an overall ACCEPTABLE risk for the planned procedure without further cardiovascular testing or intervention at this time". In review of his medication reconciliation, the patient is not noted to be taking any type of anticoagulation or antiplatelet therapies that would need to be held during his perioperative course.  Patient denies previous perioperative complications with anesthesia in the past. In review of the available records, it is noted that patient underwent a general anesthetic course here at [facility] (ASA III) in 05/2020 without documented complications.      05/15/2022    2:44 PM 02/06/2022    4:35 PM 02/02/2022    2:47 PM  Vitals with BMI  Height  5' 7.5"   Weight 218 lbs 10 oz 206 lbs 4 oz   BMI  25.95   Systolic [phone]  Diastolic 295 188 89  Pulse 80 69 65    Providers/Specialists:   NOTE: Primary physician provider listed below. Patient may have been seen by APP or partner within same practice.   PROVIDER ROLE / SPECIALTY LAST Imelda Pillow, MD Vascular Surgery (Surgeon) 05/15/2022  Jon Billings, NP Primary Care Provider 02/20/2022  Kathlyn Sacramento, MD Cardiology 02/06/2022  Estevan Oaks, NP-C Palliative Care 04/23/2022   Allergies:  Atorvastatin, Avelox [moxifloxacin hcl in nacl], Buprenorphine, Dilaudid [hydromorphone hcl], Fluoxetine, Levofloxacin, Morphine and related, Other, Suboxone [buprenorphine hcl-naloxone hcl], and Vancomycin  Current Home Medications:   No current facility-administered medications for this encounter.    albuterol (VENTOLIN HFA) 108 (90 Base) MCG/ACT inhaler   amLODipine (NORVASC) 10 MG tablet   budesonide-formoterol (SYMBICORT) 160-4.5 MCG/ACT inhaler   calcitRIOL (ROCALTROL) 0.25 MCG capsule   carvedilol (COREG) 25 MG tablet   diclofenac Sodium (VOLTAREN) 1 % GEL   DULoxetine (CYMBALTA) 60 MG capsule   fenofibrate micronized (LOFIBRA) 67 MG capsule   gabapentin (NEURONTIN) 100 MG capsule   glucose blood (ONETOUCH ULTRA) test strip   hydrALAZINE (APRESOLINE) 50 MG tablet   hydrOXYzine (ATARAX) 50 MG tablet   lidocaine (LIDODERM) 5 %   pantoprazole (PROTONIX) 40 MG tablet   patiromer (VELTASSA) 8.4 g packet   pregabalin (LYRICA) 50 MG capsule   topiramate (TOPAMAX) 50 MG tablet   traZODone (DESYREL) 100 MG tablet   History:   Past Medical History:  Diagnosis Date   Abdominal abscess    a.) chronic MRSA infection   Acute pancreatitis    Anxiety    Asthma    Brain aneurysm    a.) 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 b.) RIGHT crainotomy for clip ligation 05/14/2019.   Chronic back pain    Chronic heart failure with preserved ejection fraction (HFpEF) ([place])    a. 11/2021 Echo: EF 55-60%, no rwma, mod LVH, GrI DD. Nl RV size/fxn. Mildly dil  LA.   CKD (chronic kidney disease), stage V (HCC)    Depression    Emphysema of lung (HCC)    Erectile dysfunction    Followed by palliative care service    GERD (gastroesophageal reflux disease)    History of methicillin resistant staphylococcus aureus (MRSA)    HLD  (hyperlipidemia)    Hypertension    Mild cognitive impairment    MRSA (methicillin resistant Staphylococcus aureus)    Obesity    OSA (obstructive sleep apnea) 2013   a.) not compliant with nocturnal PAP therapy; CPAP machine "lost or stolen".   Panic disorder    Perforated bowel ([place]) 12/10/2005   Tempoary Colostomy Bag, Skin Graft for Abd wound   Polysubstance abuse ([place])    a.) ETOH, tobacco, marijuana, methamphetamines, cocaine, BZO, opioids.   Subarachnoid hemorrhage ([place]) 01/26/2010   a.) 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   T2DM (type 2 diabetes mellitus) ([place])    Tobacco abuse    Type 2 diabetes mellitus without complication, without long-term current use of insulin ([place]) 04/17/2016   Vitreous hemorrhage ([place]) 03/27/2011   Overview:  Bilateral; 01/2010, [facility]    Past Surgical History:  Procedure Laterality Date   CEREBRAL ANEURYSM REPAIR Right 01/26/2010   Procedure: CEREBRAL ANEURYSM REPAIR (COIL EMBOLIZATION)   CEREBRAL ANEURYSM REPAIR Right 05/14/2019   Procedure: CRAINOTOMY FOR CEREBRAL ANEURYSM REPAIR (CLIP LIGATION)   COLON SURGERY  12/10/2005   colostomy bag placed s/p perforated bowel   COLONOSCOPY WITH PROPOFOL N/A 05/18/2020   Procedure: COLONOSCOPY WITH PROPOFOL;  Surgeon: Lin Landsman, MD;  Location: [facility];  Service: Gastroenterology;  Laterality: N/A;   KNEE SURGERY Right    Perforated bowel     Family History  Problem Relation Age of Onset   Arthritis Mother    Asthma Mother    Diabetes Mother    Heart disease Mother    Hyperlipidemia Mother    Hypertension Mother    Kidney disease Mother    Thyroid disease Mother    Lung disease Mother    Anxiety disorder Mother    Depression Mother    Diabetes Father    Heart disease Father    Depression Father    Anxiety disorder Father    Arthritis Sister    Asthma Sister    Hyperlipidemia Sister    Hypertension  Sister    Lung disease Sister    Anxiety disorder Sister    Depression Sister    Hyperlipidemia Brother    Hypertension Brother    Diabetes Sister    Heart disease Sister    Depression Sister    Anxiety disorder Sister    Anxiety disorder Brother    Depression Brother    Heart disease Brother    Social History   Tobacco Use   Smoking status: Every Day    Packs/day: 0.50    Types: Cigarettes    Start date: 10/11/1984   Smokeless tobacco: Never  Vaping Use   Vaping Use: Never used  Substance Use Topics   Alcohol use: Yes   Drug use: Not Currently    Types: "Crack" cocaine, Amphetamines, Methamphetamines, Benzodiazepines, Cocaine, Marijuana, Other-see comments    Comment: opioids    Pertinent Clinical Results:  LABS: Labs reviewed: Acceptable for surgery.  Hospital Outpatient Visit on 05/21/2022  Component Date Value Ref Range Status   ABO/RH(D) 05/21/2022 O NEG   Final   Antibody Screen 05/21/2022  NEG   Final   Sample Expiration 05/21/2022 06/04/2022,2359   Final   Extend sample reason 05/21/2022    Final                   Value:NO TRANSFUSIONS OR PREGNANCY IN THE PAST 3 MONTHS Performed at [facility], [place], [place] [address]    WBC 05/21/2022 8.3  4.0 - 10.5 K/uL Final   RBC 05/21/2022 4.06 (L)  4.22 - 5.81 MIL/uL Final   Hemoglobin 05/21/2022 11.9 (L)  13.0 - 17.0 g/dL Final   HCT 05/21/2022 35.6 (L)  39.0 - 52.0 % Final   MCV 05/21/2022 87.7  80.0 - 100.0 fL Final   MCH 05/21/2022 29.3  26.0 - 34.0 pg Final   MCHC 05/21/2022 33.4  30.0 - 36.0 g/dL Final   RDW 05/21/2022 13.1  11.5 - 15.5 % Final   Platelets 05/21/2022 290  150 - 400 K/uL Final   nRBC 05/21/2022 0.0  0.0 - 0.2 % Final   Performed at [facility], [place], [place] [address]   Sodium 05/21/2022 141  135 - 145 mmol/L Final   Potassium 05/21/2022 3.9  3.5 - 5.1 mmol/L Final   Chloride 05/21/2022 112 (H)  98 - 111 mmol/L Final   CO2 05/21/2022  23  22 - 32 mmol/L Final   Glucose, Bld 05/21/2022 92  70 - 99 mg/dL Final   Glucose reference range applies only to samples taken after fasting for at least 8 hours.   BUN 05/21/2022 48 (H)  6 - 20 mg/dL Final   Creatinine, Ser 05/21/2022 4.53 (H)  0.61 - 1.24 mg/dL Final   Calcium 05/21/2022 9.0  8.9 - 10.3 mg/dL Final   GFR, Estimated 05/21/2022 14 (L)  >60 mL/min Final   Comment: (NOTE) Calculated using the CKD-EPI Creatinine Equation (2021)    Anion gap 05/21/2022 6  5 - 15 Final   Performed at [facility], [address] [place], [place] [address]    ECG: Date: 05/21/2022 Time ECG obtained: 1159 AM Rate: 77 bpm Rhythm: normal sinus Axis (leads I and aVF): Left axis deviation Intervals: PR 164 ms. QRS 94 ms. QTc 443 ms. ST segment and T wave changes: Nonspecific inferolateral T wave abnormalities.  Evidence of an age undetermined anteroseptal infarct present. Comparison: Previous tracing on 01/22/2022 showed inferolateral T wave inversions, which have improved on most recent tracing.   IMAGING / PROCEDURES: DG CHEST PORTABLE 1 VIEW performed on 01/22/2022 The heart size and mediastinal contours are within normal limits. Both lungs are clear.  The visualized skeletal structures are unremarkable. No active disease.  MRI BRAIN AND ANGIO HEAD/NECK WO CONTRAST performed on 01/05/2022 Brain MRI: No acute intracranial finding. Sequela of prior aneurysm treatment on the right. In the setting of acute headache, recommend noncontrast head CT which has better sensitivity for acute subarachnoid hemorrhage. Bilateral maxillary sinusitis. Intracranial  MRA: Right supraclinoid ICA region aneurysm coiling and clipping with artifact obscuring signal in adjacent vessels. No evidence of recurrence or additional aneurysm. Motion degraded. Atherosclerosis including a moderate right vertebral stenosis. Neck MRA: Motion degraded with limited coverage to the degree of limited  utility.  Antegrade flow in the bilateral carotid and vertebral arteries.  TRANSTHORACIC ECHOCARDIOGRAM performed on 11/27/2021 Left ventricular ejection fraction, by estimation, is 55 to 60%. The left ventricle has normal function. The left ventricle has no regional wall motion abnormalities. There is moderate left ventricular hypertrophy. Left  ventricular diastolic parameters are consistent with Grade I diastolic dysfunction (impaired relaxation).  Right ventricular systolic function is normal. The right ventricular size is normal. Tricuspid regurgitation signal is inadequate for assessing PA pressure.  Left atrial size was mildly dilated. The mitral valve is normal in structure. No evidence of mitral valve regurgitation. No evidence of mitral stenosis. The aortic valve is normal in structure. Aortic valve regurgitation is not visualized. Aortic valve sclerosis is present, with no evidence of aortic valve stenosis  Impression and Plan:  Derek Cooley. has been referred for pre-anesthesia review and clearance prior to him undergoing the planned anesthetic and procedural courses. Available labs, pertinent testing, and imaging results were personally reviewed by me. This patient has been appropriately cleared by cardiology with an overall ACCEPTABLE risk of significant perioperative cardiovascular complications.  Based on clinical review performed today (05/23/22), barring any significant acute changes in the patient's overall condition, it is anticipated that he will be able to proceed with the planned surgical intervention. Any acute changes in clinical condition may necessitate his procedure being postponed and/or cancelled. Patient will meet with anesthesia team (MD and/or CRNA) on the day of his procedure for preoperative evaluation/assessment. Questions regarding anesthetic course will be fielded at that time.   Pre-surgical instructions were reviewed with the patient during his PAT  appointment and questions were fielded by PAT clinical staff. Patient was advised that if any questions or concerns arise prior to his procedure then he should return a call to PAT and/or his surgeon's office to discuss.  Honor Loh, MSN, APRN, FNP-C, CEN [facility]  Peri-operative Services Nurse Practitioner Phone: [phone] Fax: [phone] 05/23/22 10:08 AM  NOTE: This note has been prepared using Dragon dictation software. Despite my best ability to proofread, there is always the potential that unintentional transcriptional errors may still occur from this process.

## 2022-05-22 NOTE — Progress Notes (Signed)
Virtual Visit via Telephone Note   Because of Derek S Bashor Jr.'s co-morbid illnesses, he is at least at moderate risk for complications without adequate follow up.  This format is felt to be most appropriate for this patient at this time.  The patient did not have access to video technology/had technical difficulties with video requiring transitioning to audio format only (telephone).  All issues noted in this document were discussed and addressed.  No physical exam could be performed with this format.  Please refer to the patient's chart for his consent to telehealth for [facility].  Evaluation Performed:  Preoperative cardiovascular risk assessment _____________   Date:  05/22/2022   Patient ID:  Derek Cooley., DOB 03-Dec-1966, MRN [id number] Patient Location:  Home Provider location:   Office  Primary Care Provider:  Jon Billings, NP Primary Cardiologist:  Kathlyn Sacramento, MD  Chief Complaint / Patient Profile   56 y.o. y/o male with a h/o hypertension, diabetes, subarachnoid hemorrhage s/p craniotomy and clipping, sleep apnea, obesity, COPD, stage IV CKD, abdominal abscess with chronic MRSA infection, polysubstance abuse who is pending AV fistula creation and presents today for telephonic preoperative cardiovascular risk assessment.  Past Medical History    Past Medical History:  Diagnosis Date   Abdominal abscess    a.) chronic MRSA infection   Acute pancreatitis    Anxiety    Asthma    Brain aneurysm    a.) 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 b.) RIGHT crainotomy for clip ligation 05/14/2019.   Chronic back pain    Chronic heart failure with preserved ejection fraction (HFpEF) ([place])    a. 11/2021 Echo: EF 55-60%, no rwma, mod LVH, GrI DD. Nl RV size/fxn. Mildly dil LA.   CKD (chronic kidney disease), stage V (HCC)    Depression    Emphysema of lung (HCC)    Erectile dysfunction     Followed by palliative care service    GERD (gastroesophageal reflux disease)    History of methicillin resistant staphylococcus aureus (MRSA)    Hypertension    Mild cognitive impairment    MRSA (methicillin resistant Staphylococcus aureus)    Obesity    OSA (obstructive sleep apnea) 2013   a.) not compliant with nocturnal PAP therapy; CPAP machine "lost or stolen".   Panic disorder    Perforated bowel ([place]) 12/10/2005   Tempoary Colostomy Bag, Skin Graft for Abd wound   Polysubstance abuse ([place])    a.) ETOH, tobacco, marijuana, methamphetamines, cocaine, BZO, opioids.   Subarachnoid hemorrhage ([place]) 01/26/2010   a.) 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   T2DM (type 2 diabetes mellitus) ([place])    Tobacco abuse    Type 2 diabetes mellitus without complication, without long-term current use of insulin ([place]) 04/17/2016   Vitreous hemorrhage ([place]) 03/27/2011   Overview:  Bilateral; 01/2010, from [facility]    Past Surgical History:  Procedure Laterality Date   CEREBRAL ANEURYSM REPAIR Right 01/26/2010   Procedure: CEREBRAL ANEURYSM REPAIR (COIL EMBOLIZATION)   CEREBRAL ANEURYSM REPAIR Right 05/14/2019   Procedure: CRAINOTOMY FOR CEREBRAL ANEURYSM REPAIR (CLIP LIGATION)   COLON SURGERY  12/10/2005   colostomy bag placed s/p perforated bowel   COLONOSCOPY WITH PROPOFOL N/A 05/18/2020   Procedure: COLONOSCOPY WITH PROPOFOL;  Surgeon: Lin Landsman, MD;  Location: [facility];  Service: Gastroenterology;  Laterality: N/A;   KNEE SURGERY Right  Perforated bowel      Allergies  Allergies  Allergen Reactions   Atorvastatin     Joint Aches - Severe Joint Aches - Severe Joint Aches - Severe   Avelox [Moxifloxacin Hcl In Nacl]     Muscle pain   Buprenorphine     Mouth sores, confusion, shaking   Dilaudid [Hydromorphone Hcl] Hives   Fluoxetine Itching   Levofloxacin Other (See Comments)    Joint Pain    Morphine And Related    Other     Muscle pain   Suboxone [Buprenorphine Hcl-Naloxone Hcl] Other (See Comments)    Rash and confused   Vancomycin     Renal insufficiency    History of Present Illness    Derek Shaggy Shaffer [name] is a 56 y.o. male who presents via audio/video conferencing for a telehealth visit today.  Pt was last seen in cardiology clinic on 02/06/22 by Dr. Fletcher Anon.  At that time Derek Cooley. was doing well.  The patient is now pending procedure as outlined above. Since his last visit, he denies chest pain, fatigue, palpitations, melena, hematuria, hemoptysis, diaphoresis, weakness, presyncope, syncope, orthopnea, and PND.  Reports chronic shortness of breath is stable at present. Has mild bilateral lower extremity edema that he feels has improved recently.  Home Medications    Prior to Admission medications   Medication Sig Start Date End Date Taking? Authorizing Provider  albuterol (VENTOLIN HFA) 108 (90 Base) MCG/ACT inhaler Inhale 1-2 puffs into the lungs every 6 (six) hours as needed for wheezing or shortness of breath. 11/28/21   Fritzi Mandes, MD  amLODipine (NORVASC) 10 MG tablet Take 1 tablet (10 mg total) by mouth daily. 11/28/21   Fritzi Mandes, MD  budesonide-formoterol [facility]) 160-4.5 MCG/ACT inhaler Inhale 2 puffs into the lungs in the morning and at bedtime. 12/07/21   Jon Billings, NP  calcitRIOL (ROCALTROL) 0.25 MCG capsule Take 0.25 mcg by mouth daily. 01/24/22   [provider]  carvedilol (COREG) 25 MG tablet Take 1 tablet (25 mg total) by mouth 2 (two) times daily with a meal. 11/28/21   Fritzi Mandes, MD  diclofenac Sodium (VOLTAREN) 1 % GEL Apply 2 g topically 4 (four) times daily. 01/23/22   Carrie Mew, MD  DULoxetine (CYMBALTA) 60 MG capsule Take 1 capsule (60 mg total) by mouth daily. 11/28/21 05/21/22  Fritzi Mandes, MD  fenofibrate micronized (LOFIBRA) 67 MG capsule TAKE 1 CAPSULE EVERY DAY BEFORE BREAKFAST 11/28/21   Fritzi Mandes, MD   gabapentin (NEURONTIN) 100 MG capsule Take 50 mg by mouth daily.    [provider]  glucose blood (ONETOUCH ULTRA) test strip USE TO TEST BLOOD SUGAR DAILY 08/23/20   Noemi Chapel A, NP  hydrALAZINE (APRESOLINE) 50 MG tablet Take 1 tablet (50 mg total) by mouth 2 (two) times daily. 02/06/22 05/21/22  Wellington Hampshire, MD  hydrOXYzine (ATARAX) 50 MG tablet TAKE ONE TABLET 3 TIMES DAILY AS NEEDED 12/07/21   Jon Billings, NP  lidocaine (LIDODERM) 5 % Place 1 patch onto the skin every 12 (twelve) hours. Remove & Discard patch within 12 hours or as directed by MD 01/23/22   Carrie Mew, MD  lisinopril (ZESTRIL) 10 MG tablet Take 1 tablet (10 mg total) by mouth daily. Patient not taking: Reported on 05/15/2022 01/23/22 05/15/22  Carrie Mew, MD  pantoprazole (PROTONIX) 40 MG tablet Take 1 tablet (40 mg total) by mouth 2 (two) times daily as needed. 11/28/21   Fritzi Mandes, MD  patiromer (  VELTASSA) 8.4 g packet Take 8.4 g by mouth daily.    [provider]  pregabalin (LYRICA) 50 MG capsule Take 1 capsule (50 mg total) by mouth 2 (two) times daily. Patient taking differently: Take 50 mg by mouth daily. 11/28/21   Fritzi Mandes, MD  topiramate (TOPAMAX) 50 MG tablet Take 1 tablet (50 mg total) by mouth 2 (two) times daily. 12/07/21 05/21/22  Jon Billings, NP  traZODone (DESYREL) 100 MG tablet Take 100 mg by mouth at bedtime.    [provider]    Physical Exam    Vital Signs:  Derek Cooley. does not have vital signs available for review today.  Given telephonic nature of communication, physical exam is limited. AAOx3. NAD. Normal affect.  Speech and respirations are unlabored.  Accessory Clinical Findings    None  Assessment & Plan    1.  Preoperative Cardiovascular Risk Assessment: He is doing well from a cardiac perspective and may proceed to surgery without further testing. According to the Revised Cardiac Risk Index (RCRI), his Perioperative  Risk of Major Cardiac Event is (%): 6.6. His Functional Capacity in METs is: 5.19 according to the [facility] Activity Status Index (DASI).   A copy of this note will be routed to requesting surgeon.  Time:   Today, I have spent 10 minutes with the patient with telehealth technology discussing medical history, symptoms, and management plan.     Emmaline Life, NP-C    05/22/2022, 1:27 PM [address], [address] Office [phone] Fax [phone]

## 2022-05-23 ENCOUNTER — Encounter: Payer: Self-pay | Admitting: Vascular Surgery

## 2022-05-24 ENCOUNTER — Ambulatory Visit: Payer: Medicare Other | Admitting: Urgent Care

## 2022-05-24 ENCOUNTER — Encounter: Payer: Self-pay | Admitting: Vascular Surgery

## 2022-05-24 ENCOUNTER — Ambulatory Visit
Admission: RE | Admit: 2022-05-24 | Discharge: 2022-05-24 | Disposition: A | Discharge status: Home / Self Care | Payer: Medicare Other | Attending: Vascular Surgery | Admitting: Vascular Surgery

## 2022-05-24 ENCOUNTER — Ambulatory Visit: Payer: Medicare Other

## 2022-05-24 ENCOUNTER — Other Ambulatory Visit: Payer: Self-pay

## 2022-05-24 ENCOUNTER — Encounter
Admission: RE | Disposition: A | Discharge status: Home / Self Care | Payer: Self-pay | Source: Home / Self Care | Attending: Vascular Surgery

## 2022-05-24 DIAGNOSIS — K219 Gastro-esophageal reflux disease without esophagitis: Secondary | ICD-10-CM | POA: Insufficient documentation

## 2022-05-24 DIAGNOSIS — Z79899 Other long term (current) drug therapy: Secondary | ICD-10-CM | POA: Insufficient documentation

## 2022-05-24 DIAGNOSIS — F17210 Nicotine dependence, cigarettes, uncomplicated: Secondary | ICD-10-CM | POA: Diagnosis not present

## 2022-05-24 DIAGNOSIS — G4733 Obstructive sleep apnea (adult) (pediatric): Secondary | ICD-10-CM | POA: Insufficient documentation

## 2022-05-24 DIAGNOSIS — F1410 Cocaine abuse, uncomplicated: Secondary | ICD-10-CM

## 2022-05-24 DIAGNOSIS — I5032 Chronic diastolic (congestive) heart failure: Secondary | ICD-10-CM | POA: Diagnosis not present

## 2022-05-24 DIAGNOSIS — N185 Chronic kidney disease, stage 5: Secondary | ICD-10-CM | POA: Diagnosis not present

## 2022-05-24 DIAGNOSIS — N186 End stage renal disease: Secondary | ICD-10-CM | POA: Diagnosis present

## 2022-05-24 DIAGNOSIS — J439 Emphysema, unspecified: Secondary | ICD-10-CM | POA: Insufficient documentation

## 2022-05-24 DIAGNOSIS — E1122 Type 2 diabetes mellitus with diabetic chronic kidney disease: Secondary | ICD-10-CM | POA: Insufficient documentation

## 2022-05-24 DIAGNOSIS — I132 Hypertensive heart and chronic kidney disease with heart failure and with stage 5 chronic kidney disease, or end stage renal disease: Secondary | ICD-10-CM | POA: Insufficient documentation

## 2022-05-24 DIAGNOSIS — E785 Hyperlipidemia, unspecified: Secondary | ICD-10-CM | POA: Insufficient documentation

## 2022-05-24 HISTORY — DX: Male erectile dysfunction, unspecified: N52.9

## 2022-05-24 HISTORY — DX: Hyperlipidemia, unspecified: E78.5

## 2022-05-24 HISTORY — DX: Type 2 diabetes mellitus without complications: E11.9

## 2022-05-24 HISTORY — DX: Other chronic pain: G89.29

## 2022-05-24 HISTORY — PX: AV FISTULA PLACEMENT: SHX1204

## 2022-05-24 HISTORY — DX: Cerebral aneurysm, nonruptured: I67.1

## 2022-05-24 HISTORY — DX: Chronic kidney disease, stage 5: N18.5

## 2022-05-24 HISTORY — DX: Personal history of Methicillin resistant Staphylococcus aureus infection: Z86.14

## 2022-05-24 HISTORY — DX: Other chronic pain: M54.9

## 2022-05-24 HISTORY — DX: Encounter for palliative care: Z51.5

## 2022-05-24 LAB — URINE DRUG SCREEN, QUALITATIVE (ARMC ONLY)
Amphetamines, Ur Screen: NOT DETECTED
Barbiturates, Ur Screen: NOT DETECTED
Benzodiazepine, Ur Scrn: NOT DETECTED
Cannabinoid 50 Ng, Ur ~~LOC~~: NOT DETECTED
Cocaine Metabolite,Ur ~~LOC~~: NOT DETECTED
MDMA (Ecstasy)Ur Screen: NOT DETECTED
Methadone Scn, Ur: NOT DETECTED
Opiate, Ur Screen: NOT DETECTED
Phencyclidine (PCP) Ur S: NOT DETECTED
Tricyclic, Ur Screen: NOT DETECTED

## 2022-05-24 LAB — GLUCOSE, CAPILLARY
Glucose-Capillary: 94 mg/dL (ref 70–99)
Glucose-Capillary: 95 mg/dL (ref 70–99)

## 2022-05-24 LAB — ABO/RH: ABO/RH(D): O NEG

## 2022-05-24 SURGERY — ARTERIOVENOUS (AV) FISTULA CREATION
Anesthesia: General | Site: Arm Lower | Laterality: Left

## 2022-05-24 MED ORDER — PROPOFOL 10 MG/ML IV BOLUS
INTRAVENOUS | Status: DC | PRN
  Administered 2022-05-24: 20 mg via INTRAVENOUS

## 2022-05-24 MED ORDER — CHLORHEXIDINE GLUCONATE CLOTH 2 % EX PADS: 6.0000 | MEDICATED_PAD | Freq: Once | CUTANEOUS | Status: DC

## 2022-05-24 MED ORDER — FENTANYL CITRATE PF 50 MCG/ML IJ SOSY
50.0000 ug | PREFILLED_SYRINGE | Freq: Once | INTRAMUSCULAR | Status: AC
  Administered 2022-05-24: 50 ug via INTRAVENOUS

## 2022-05-24 MED ORDER — HEPARIN SODIUM (PORCINE) 5000 UNIT/ML IJ SOLN
INTRAMUSCULAR | Status: AC
  Filled 2022-05-24: qty 1

## 2022-05-24 MED ORDER — ONDANSETRON HCL 4 MG/2ML IJ SOLN
INTRAMUSCULAR | Status: DC | PRN
  Administered 2022-05-24: 4 mg via INTRAVENOUS

## 2022-05-24 MED ORDER — CHLORHEXIDINE GLUCONATE CLOTH 2 % EX PADS
6.0000 | MEDICATED_PAD | Freq: Once | CUTANEOUS | Status: AC
  Administered 2022-05-24: 6 via TOPICAL

## 2022-05-24 MED ORDER — CHLORHEXIDINE GLUCONATE 0.12 % MT SOLN
OROMUCOSAL | Status: AC
  Administered 2022-05-24: 15 mL via OROMUCOSAL
  Filled 2022-05-24: qty 15

## 2022-05-24 MED ORDER — PHENYLEPHRINE HCL-NACL 20-0.9 MG/250ML-% IV SOLN
INTRAVENOUS | Status: DC | PRN
  Administered 2022-05-24: 30 ug/min via INTRAVENOUS

## 2022-05-24 MED ORDER — CEFAZOLIN SODIUM-DEXTROSE 2-4 GM/100ML-% IV SOLN
INTRAVENOUS | Status: AC
  Filled 2022-05-24: qty 100

## 2022-05-24 MED ORDER — PAPAVERINE HCL 30 MG/ML IJ SOLN
INTRAMUSCULAR | Status: AC
  Filled 2022-05-24: qty 2

## 2022-05-24 MED ORDER — HEPARIN SODIUM (PORCINE) 1000 UNIT/ML IJ SOLN
INTRAMUSCULAR | Status: DC | PRN
  Administered 2022-05-24: 3000 [IU] via INTRAVENOUS

## 2022-05-24 MED ORDER — HYDROCODONE-ACETAMINOPHEN 5-325 MG PO TABS: 2.0000 | ORAL_TABLET | Freq: Four times a day (QID) | ORAL | 0 refills | Status: DC | PRN

## 2022-05-24 MED ORDER — LIDOCAINE HCL (PF) 2 % IJ SOLN
INTRAMUSCULAR | Status: AC
  Filled 2022-05-24: qty 5

## 2022-05-24 MED ORDER — SODIUM CHLORIDE 0.9 % IV SOLN: INTRAVENOUS | Status: DC

## 2022-05-24 MED ORDER — ONDANSETRON HCL 4 MG/2ML IJ SOLN: 4.0000 mg | Freq: Four times a day (QID) | INTRAMUSCULAR | Status: DC | PRN

## 2022-05-24 MED ORDER — HEPARIN 5000 UNITS IN NS 1000 ML (FLUSH)
INTRAMUSCULAR | Status: DC | PRN
  Administered 2022-05-24: 1 mL via INTRAMUSCULAR

## 2022-05-24 MED ORDER — FENTANYL CITRATE (PF) 100 MCG/2ML IJ SOLN
INTRAMUSCULAR | Status: DC | PRN
  Administered 2022-05-24: 25 ug via INTRAVENOUS

## 2022-05-24 MED ORDER — 0.9 % SODIUM CHLORIDE (POUR BTL) OPTIME
TOPICAL | Status: DC | PRN
  Administered 2022-05-24: 500 mL

## 2022-05-24 MED ORDER — ONDANSETRON HCL 4 MG/2ML IJ SOLN
INTRAMUSCULAR | Status: AC
  Filled 2022-05-24: qty 2

## 2022-05-24 MED ORDER — BUPIVACAINE-EPINEPHRINE (PF) 0.5% -1:200000 IJ SOLN
INTRAMUSCULAR | Status: AC
  Filled 2022-05-24: qty 30

## 2022-05-24 MED ORDER — CEFAZOLIN SODIUM-DEXTROSE 2-4 GM/100ML-% IV SOLN
2.0000 g | INTRAVENOUS | Status: AC
  Administered 2022-05-24: 2 g via INTRAVENOUS

## 2022-05-24 MED ORDER — CHLORHEXIDINE GLUCONATE 0.12 % MT SOLN: 15.0000 mL | Freq: Once | OROMUCOSAL | Status: AC

## 2022-05-24 MED ORDER — ORAL CARE MOUTH RINSE: 15.0000 mL | Freq: Once | OROMUCOSAL | Status: AC

## 2022-05-24 MED ORDER — OXYCODONE HCL 5 MG PO TABS: 5.0000 mg | ORAL_TABLET | Freq: Once | ORAL | Status: DC | PRN

## 2022-05-24 MED ORDER — PROPOFOL 500 MG/50ML IV EMUL
INTRAVENOUS | Status: DC | PRN
  Administered 2022-05-24: 100 ug/kg/min via INTRAVENOUS

## 2022-05-24 MED ORDER — BUPIVACAINE HCL (PF) 0.5 % IJ SOLN
INTRAMUSCULAR | Status: AC
  Filled 2022-05-24: qty 30

## 2022-05-24 MED ORDER — OXYCODONE HCL 5 MG/5ML PO SOLN: 5.0000 mg | Freq: Once | ORAL | Status: DC | PRN

## 2022-05-24 MED ORDER — FENTANYL CITRATE PF 50 MCG/ML IJ SOSY: 12.5000 ug | PREFILLED_SYRINGE | Freq: Once | INTRAMUSCULAR | Status: DC | PRN

## 2022-05-24 MED ORDER — BUPIVACAINE HCL (PF) 0.5 % IJ SOLN
INTRAMUSCULAR | Status: DC | PRN
  Administered 2022-05-24: 20 mL via PERINEURAL
  Administered 2022-05-24: 10 mL via PERINEURAL

## 2022-05-24 MED ORDER — FENTANYL CITRATE (PF) 100 MCG/2ML IJ SOLN: 25.0000 ug | INTRAMUSCULAR | Status: DC | PRN

## 2022-05-24 MED ORDER — FENTANYL CITRATE (PF) 100 MCG/2ML IJ SOLN
INTRAMUSCULAR | Status: AC
  Filled 2022-05-24: qty 2

## 2022-05-24 MED ORDER — LIDOCAINE HCL (CARDIAC) PF 100 MG/5ML IV SOSY
PREFILLED_SYRINGE | INTRAVENOUS | Status: DC | PRN
  Administered 2022-05-24: 50 mg via INTRAVENOUS

## 2022-05-24 MED ORDER — MIDAZOLAM HCL 2 MG/2ML IJ SOLN
1.0000 mg | INTRAMUSCULAR | Status: DC | PRN
  Administered 2022-05-24: 1 mg via INTRAVENOUS

## 2022-05-24 MED ORDER — PROPOFOL 1000 MG/100ML IV EMUL
INTRAVENOUS | Status: AC
  Filled 2022-05-24: qty 100

## 2022-05-24 SURGICAL SUPPLY — 54 items

## 2022-05-24 NOTE — Interval H&P Note (Signed)
History and Physical Interval Note:  05/24/2022 11:16 AM  Derek Cooley.  has presented today for surgery, with the diagnosis of CHRONIC KIDNEY DISEASE STAGE V.  The various methods of treatment have been discussed with the patient and family. After consideration of risks, benefits and other options for treatment, the patient has consented to  Procedure(s): ARTERIOVENOUS (AV) FISTULA CREATION ( RADIAL CEPHALIC) (Right) as a surgical intervention.  The patient's history has been reviewed, patient examined, no change in status, stable for surgery.  I have reviewed the patient's chart and labs.  Questions were answered to the patient's satisfaction.     Leotis Pain

## 2022-05-24 NOTE — Interval H&P Note (Signed)
History and Physical Interval Note:  05/24/2022 11:16 AM  Derek Cooley.  has presented today for surgery, with the diagnosis of CHRONIC KIDNEY DISEASE STAGE V.  The various methods of treatment have been discussed with the patient and family. After consideration of risks, benefits and other options for treatment, the patient has consented to  LEFT radiocephalic AVF as a surgical intervention.  The patient's history has been reviewed, patient examined, no change in status, stable for surgery.  I have reviewed the patient's chart and labs.  Questions were answered to the patient's satisfaction.     Leotis Pain

## 2022-05-24 NOTE — Anesthesia Procedure Notes (Signed)
Anesthesia Regional Block: Supraclavicular block   Pre-Anesthetic Checklist: , timeout performed,  Correct Patient, Correct Site, Correct Laterality,  Correct Procedure, Correct Position, site marked,  Risks and benefits discussed,  Surgical consent,  Pre-op evaluation,  At surgeon's request and post-op pain management  Laterality: Upper and Left  Prep: chloraprep       Needles:  Injection technique: Single-shot  Needle Type: Stimiplex     Needle Length: 9cm  Needle Gauge: 22     Additional Needles:   Procedures:,,,, ultrasound used (permanent image in chart),,    Narrative:  Start time: 05/24/2022 12:01 PM End time: 05/24/2022 12:06 PM Injection made incrementally with aspirations every 5 mL.  Performed by: Personally  Anesthesiologist: Lamount Bankson, Precious Haws, MD  Additional Notes: Patient consented for risk and benefits of nerve block including but not limited to nerve damage, failed block, bleeding and infection.  Patient voiced understanding.  Functioning IV was confirmed and monitors were applied.  Timeout done prior to procedure and prior to any sedation being given to the patient.  Patient confirmed procedure site prior to any sedation given to the patient.  A 109mm 22ga Stimuplex needle was used. Sterile prep,hand hygiene and sterile gloves were used.  Minimal sedation used for procedure.  No paresthesia endorsed by patient during the procedure.  Negative aspiration and negative test dose prior to incremental administration of local anesthetic. The patient tolerated the procedure well with no immediate complications.

## 2022-05-24 NOTE — Interval H&P Note (Signed)
History and Physical Interval Note:  05/24/2022 11:16 AM  Derek Cooley.  has presented today for surgery, with the diagnosis of CHRONIC KIDNEY DISEASE STAGE V.  The various methods of treatment have been discussed with the patient and family. After consideration of risks, benefits and other options for treatment, the patient has consented to Left radiocephalic AVF as a surgical intervention.  The patient's history has been reviewed, patient examined, no change in status, stable for surgery.  I have reviewed the patient's chart and labs.  Questions were answered to the patient's satisfaction.     Leotis Pain

## 2022-05-24 NOTE — Transfer of Care (Signed)
Immediate Anesthesia Transfer of Care Note  Patient: Derek Cooley.  Procedure(s) Performed: ARTERIOVENOUS (AV) FISTULA CREATION ( RADIAL CEPHALIC) (Left: Arm Lower)  Patient Location: PACU  Anesthesia Type:General  Level of Consciousness: awake  Airway & Oxygen Therapy: Patient Spontanous Breathing and Patient connected to face mask oxygen  Post-op Assessment: Report given to RN and Post -op Vital signs reviewed and stable  Post vital signs: Reviewed and stable  Last Vitals:  Vitals Value Taken Time  BP 110/70 05/24/22 1356  Temp    Pulse 63 05/24/22 1356  Resp 20 05/24/22 1356  SpO2 100 % 05/24/22 1356    Last Pain:  Vitals:   05/24/22 1057  PainSc: 4          Complications: No notable events documented.

## 2022-05-24 NOTE — Op Note (Signed)
[facility]   OPERATIVE NOTE   PROCEDURE: Left radiocephaic arteriovenous fistula placement  PRE-OPERATIVE DIAGNOSIS: 1.  Chronic kidney disease stage V  POST-OPERATIVE DIAGNOSIS: Same  SURGEON: Leotis Pain, MD  ASSISTANT(S): None  ANESTHESIA: general  ESTIMATED BLOOD LOSS: 10 cc  FINDING(S): Adequate cephalic vein and radial artery for fistula creation  SPECIMEN(S):  None  INDICATIONS:   Derek Cooley. is a 56 y.o. male who presents with chronic kidney disease nearing dialysis dependence.  The patient is scheduled for left radiocephalic arteriovenous fistula placement when non-invasive studies suggested adequate anatomy for fistula creation at this location.  The patient is aware the risks include but are not limited to: bleeding, infection, steal syndrome, nerve damage, ischemic monomelic neuropathy, failure to mature, and need for additional procedures.  The patient is aware of the risks of the procedure and elects to proceed forward.  DESCRIPTION: After full informed written consent was obtained from the patient, the patient was brought back to the operating room and placed supine upon the operating table.  Prior to induction, the patient received IV antibiotics.   After obtaining adequate anesthesia, the patient was then prepped and draped in the standard fashion for a left arm access procedure.  I turned my attention first to identifying the patient's distal cephalic vein and radial artery.  I made an incision at the level of the distal forearm and wrist and dissected through the subcutaneous tissue and fascia to gain exposure of the radial artery.  This was noted to be only mildly diseased and of decent size and useable for fistula creation.  This was dissected out proximally and distally and controlled with vessel loops.  I then dissected out the cephalic vein.  This was noted to be patent and adequate size for fistula creation. I then gave the patient  3000 units of intravenous heparin.  The distal segment of the vein was ligated with a  2-0 silk, and the vein was transected.    I then instilled the heparinized saline into the vein and clamped it.  At this point, I reset my exposure of the radial artery and placed the artery under tension proximally and distally.  I made an arteriotomy with a #11 blade, and then I extended the arteriotomy with a Potts scissor.  I injected heparinized saline proximal and distal to this arteriotomy.  The vein was then sewn to the artery in an end-to-side configuration with a running stitch of 6-0 Prolene.  Prior to completing this anastomosis, I allowed the vein and artery to backbleed.  There was no evidence of clot from any vessels.  I completed the anastomosis in the usual fashion and then released all vessel loops and clamps.  There was a palpable  thrill in the venous outflow, and there was a palpable radial pulse beyond the anastomosis.  At this point, I irrigated out the surgical wound. Surgicel was placed. There was no further active bleeding.  The subcutaneous tissue was reapproximated with a running stitch of 3-0 Vicryl.  The skin was then reapproximated with a running subcuticular stitch of 4-0 Monocryl.  The skin was then cleaned, dried, and reinforced with Dermabond.  The patient tolerated this procedure well and was taken to the recovery room in stable condition  COMPLICATIONS: None  CONDITION: Stable   Leotis Pain, MD 05/24/2022 1:50 PM   This note was created with Dragon Medical transcription system. Any errors in dictation are purely unintentional.

## 2022-05-24 NOTE — Discharge Instructions (Addendum)
AMBULATORY SURGERY  DISCHARGE INSTRUCTIONS   The drugs that you were given will stay in your system until tomorrow so for the next 24 hours you should not:  Drive an automobile Make any legal decisions Drink any alcoholic beverage   You may resume regular meals tomorrow.  Today it is better to start with liquids and gradually work up to solid foods.  You may eat anything you prefer, but it is better to start with liquids, then soup and crackers, and gradually work up to solid foods.   Please notify your doctor immediately if you have any unusual bleeding, trouble breathing, redness and pain at the surgery site, drainage, fever, or pain not relieved by medication.       Please contact your physician with any problems or Same Day Surgery at [phone], Monday through Friday 6 am to 4 pm, or [facility] [place] Main number at [phone].  

## 2022-05-24 NOTE — Anesthesia Preprocedure Evaluation (Signed)
Anesthesia Evaluation  Patient identified by MRN, date of birth, ID band Patient awake    Reviewed: Allergy & Precautions, NPO status , Patient's Chart, lab work & pertinent test results  History of Anesthesia Complications Negative for: history of anesthetic complications  Airway Mallampati: III  TM Distance: >3 FB Neck ROM: full    Dental  (+) Chipped, Poor Dentition, Missing   Pulmonary neg shortness of breath, asthma , sleep apnea , COPD, Current Smoker and Patient abstained from smoking.,    Pulmonary exam normal        Cardiovascular hypertension, (-) anginaNormal cardiovascular exam     Neuro/Psych  Headaches, PSYCHIATRIC DISORDERS    GI/Hepatic Neg liver ROS, GERD  Controlled,  Endo/Other  diabetes, Type 2  Renal/GU Renal disease  negative genitourinary   Musculoskeletal   Abdominal   Peds  Hematology negative hematology ROS (+)   Anesthesia Other Findings Past Medical History: No date: Abdominal abscess     Comment:  a.) chronic MRSA infection No date: Acute pancreatitis No date: Anxiety No date: Asthma No date: Brain aneurysm     Comment:  a.) spontaneous rupture --> 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 b.) RIGHT crainotomy for clip ligation               05/14/2019. No date: Chronic back pain No date: Chronic heart failure with preserved ejection fraction  (HFpEF) ([place])     Comment:  a. 11/2021 Echo: EF 55-60%, no rwma, mod LVH, GrI DD. Nl              RV size/fxn. Mildly dil LA. No date: CKD (chronic kidney disease), stage V (HCC) No date: Depression No date: Emphysema of lung (HCC) No date: Erectile dysfunction No date: Followed by palliative care service No date: GERD (gastroesophageal reflux disease) No date: History of methicillin resistant staphylococcus aureus (MRSA) No date: HLD (hyperlipidemia) No date:  Hypertension No date: Mild cognitive impairment No date: MRSA (methicillin resistant Staphylococcus aureus) No date: Obesity 2013: OSA (obstructive sleep apnea)     Comment:  a.) not compliant with nocturnal PAP therapy; CPAP               machine "lost or stolen". No date: Panic disorder 12/10/2005: Perforated bowel ([place])     Comment:  Tempoary Colostomy Bag, Skin Graft for Abd wound No date: Polysubstance abuse ([place])     Comment:  a.) ETOH, tobacco, marijuana, methamphetamines, cocaine,              BZO, opioids. 01/26/2010: Subarachnoid hemorrhage (HCC)     Comment:  a.) spontaneous rupture --> 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 No date: T2DM (type 2 diabetes mellitus) ([place]) No date: Tobacco abuse 04/17/2016: Type 2 diabetes mellitus without complication, without  long-term current use of insulin ([place]) 03/27/2011: Vitreous hemorrhage ([place])     Comment:  Overview:  Bilateral; 01/2010, from [facility]   Past Surgical History: 01/26/2010: CEREBRAL ANEURYSM REPAIR; Right     Comment:  Procedure: CEREBRAL ANEURYSM REPAIR (COIL EMBOLIZATION) 05/14/2019: CEREBRAL ANEURYSM REPAIR; Right     Comment:  Procedure: CRAINOTOMY FOR CEREBRAL ANEURYSM REPAIR (CLIP              LIGATION) 12/10/2005: COLON SURGERY     Comment:  colostomy bag placed s/p perforated bowel 05/18/2020: COLONOSCOPY WITH PROPOFOL; N/A     Comment:  Procedure: COLONOSCOPY WITH PROPOFOL;  Surgeon: Lin Landsman, MD;  Location: [facility] ENDOSCOPY;  Service:               Gastroenterology;  Laterality: N/A; No date: KNEE SURGERY; Right No date: Perforated bowel  BMI    Body Mass Index: 32.71 kg/m      Reproductive/Obstetrics negative OB ROS                             Anesthesia Physical Anesthesia Plan  ASA: 4  Anesthesia Plan: General   Post-op Pain Management: Regional block*   Induction:  Intravenous  PONV Risk Score and Plan: Propofol infusion and TIVA  Airway Management Planned: Natural Airway and Nasal Cannula  Additional Equipment:   Intra-op Plan:   Post-operative Plan:   Informed Consent: I have reviewed the patients History and Physical, chart, labs and discussed the procedure including the risks, benefits and alternatives for the proposed anesthesia with the patient or authorized representative who has indicated his/her understanding and acceptance.     Dental Advisory Given  Plan Discussed with: Anesthesiologist, CRNA and Surgeon  Anesthesia Plan Comments: (Patient consented for risks of anesthesia including but not limited to:  - adverse reactions to medications - risk of airway placement if required - damage to eyes, teeth, lips or other oral mucosa - nerve damage due to positioning  - sore throat or hoarseness - Damage to heart, brain, nerves, lungs, other parts of body or loss of life  Patient voiced understanding.)        Anesthesia Quick Evaluation

## 2022-05-25 NOTE — Anesthesia Postprocedure Evaluation (Signed)
Anesthesia Post Note  Patient: [name]  Procedure(s) Performed: ARTERIOVENOUS (AV) FISTULA CREATION ( RADIAL CEPHALIC) (Left: Arm Lower)  Patient location during evaluation: PACU Anesthesia Type: General Level of consciousness: awake and alert Pain management: pain level controlled Vital Signs Assessment: post-procedure vital signs reviewed and stable Respiratory status: spontaneous breathing, nonlabored ventilation, respiratory function stable and patient connected to nasal cannula oxygen Cardiovascular status: blood pressure returned to baseline and stable Postop Assessment: no apparent nausea or vomiting Anesthetic complications: no   No notable events documented.   Last Vitals:  Vitals:   05/24/22 1445 05/24/22 1505  BP: 117/75 118/76  Pulse: 66 68  Resp: 15 16  Temp: (!) 36.2 C 36.6 C  SpO2: 95% 96%    Last Pain:  Vitals:   05/24/22 1505  TempSrc: Temporal  PainSc: 0-No pain                 Precious Haws Daquann Merriott

## 2022-05-29 ENCOUNTER — Ambulatory Visit (INDEPENDENT_AMBULATORY_CARE_PROVIDER_SITE_OTHER): Payer: Medicare Other | Admitting: Medical

## 2022-05-29 ENCOUNTER — Ambulatory Visit: Payer: Medicare Other

## 2022-05-29 ENCOUNTER — Encounter: Payer: Self-pay | Admitting: Medical

## 2022-05-29 VITALS — BP 150/70 | HR 72 | Ht 67.5 in | Wt 218.4 lb

## 2022-05-29 DIAGNOSIS — R079 Chest pain, unspecified: Secondary | ICD-10-CM

## 2022-05-29 DIAGNOSIS — I10 Essential (primary) hypertension: Secondary | ICD-10-CM | POA: Diagnosis not present

## 2022-05-29 DIAGNOSIS — J449 Chronic obstructive pulmonary disease, unspecified: Secondary | ICD-10-CM

## 2022-05-29 DIAGNOSIS — N184 Chronic kidney disease, stage 4 (severe): Secondary | ICD-10-CM | POA: Diagnosis not present

## 2022-05-29 DIAGNOSIS — R778 Other specified abnormalities of plasma proteins: Secondary | ICD-10-CM | POA: Diagnosis not present

## 2022-05-29 DIAGNOSIS — G4733 Obstructive sleep apnea (adult) (pediatric): Secondary | ICD-10-CM

## 2022-05-29 MED ORDER — HYDRALAZINE HCL 100 MG PO TABS: 100.0000 mg | ORAL_TABLET | Freq: Two times a day (BID) | ORAL | 5 refills | Status: DC

## 2022-05-29 NOTE — Patient Instructions (Signed)
Medication Instructions:  Your physician has recommended you make the following change in your medication:   INCREASE Hydralazine to 100 mg twice a day. An Rx has been sent to your pharmacy.  *If you need a refill on your cardiac medications before your next appointment, please call your pharmacy*   Lab Work: None ordered If you have labs (blood work) drawn today and your tests are completely normal, you will receive your results only by: [place] (if you have MyChart) OR A paper copy in the mail If you have any lab test that is abnormal or we need to change your treatment, we will call you to review the results.   Testing/Procedures: Your physician has requested that you have a lexiscan myoview. For further information please visit [email or web address]. Please follow instruction sheet, as given.    Follow-Up: At [facility], you and your health needs are our priority.  As part of our continuing mission to provide you with exceptional heart care, we have created designated Provider Care Teams.  These Care Teams include your primary Cardiologist (physician) and Advanced Practice Providers (APPs -  Physician Assistants and Nurse Practitioners) who all work together to provide you with the care you need, when you need it.  We recommend signing up for the patient portal called "MyChart".  Sign up information is provided on this After Visit Summary.  MyChart is used to connect with patients for Virtual Visits (Telemedicine).  Patients are able to view lab/test results, encounter notes, upcoming appointments, etc.  Non-urgent messages can be sent to your provider as well.   To learn more about what you can do with MyChart, go to [email or web address].    Your next appointment:   3 month(s)   You have been referred to [facility]. Their office will contact you to schedule.   The format for your next appointment:   In Person  Provider:   You may see Kathlyn Sacramento, MD  or one of the following Advanced Practice Providers on your designated Care Team:   Murray Hodgkins, NP Christell Faith, PA-C Cadence Kathlen Mody, [place]   Other Instructions Anthony  Your caregiver has ordered a Stress Test with nuclear imaging. The purpose of this test is to evaluate the blood supply to your heart muscle. This procedure is referred to as a "Non-Invasive Stress Test." This is because other than having an IV started in your vein, nothing is inserted or "invades" your body. Cardiac stress tests are done to find areas of poor blood flow to the heart by determining the extent of coronary artery disease (CAD). Some patients exercise on a treadmill, which naturally increases the blood flow to your heart, while others who are  unable to walk on a treadmill due to physical limitations have a pharmacologic/chemical stress agent called Lexiscan . This medicine will mimic walking on a treadmill by temporarily increasing your coronary blood flow.   Please note: these test may take anywhere between 2-4 hours to complete  PLEASE REPORT TO [place] AT THE FIRST DESK WILL DIRECT YOU WHERE TO GO  Date of Procedure:_____________________________________  Arrival Time for Procedure:______________________________   PLEASE NOTIFY THE OFFICE AT LEAST 24 HOURS IN ADVANCE IF YOU ARE UNABLE TO KEEP YOUR APPOINTMENT.  [phone] AND  PLEASE NOTIFY NUCLEAR MEDICINE AT [facility] AT LEAST 24 HOURS IN ADVANCE IF YOU ARE UNABLE TO KEEP YOUR APPOINTMENT. [phone]  How to prepare for your Myoview test:  Do not eat or drink  after midnight No caffeine for 24 hours prior to test No smoking 24 hours prior to test. Your medication may be taken with water.  If your doctor stopped a medication because of this test, do not take that medication. Ladies, please do not wear dresses.  Skirts or pants are appropriate. Please wear a short sleeve shirt. No cologne or lotion. Wear  comfortable walking shoes.          Important Information About Sugar

## 2022-05-29 NOTE — Progress Notes (Signed)
Cardiology Office Note:    Date:  05/29/2022   ID:  Derek Binet., DOB 08/18/1966, MRN [id number]  PCP:  Jon Billings, NP  [facility] Cardiologist:  Kathlyn Sacramento, MD  [facility] Electrophysiologist:  None   Referring MD: Jon Billings, NP   Chief Complaint: 3 month follow-up  History of Present Illness:    Derek Binet. is a 56 y.o. male with a hx of HTN, diabetes mellitus, subarachnoid hemorrhage s/p craniotomy and clipping, sleep apnea, obesity, CKD stage IV, abdominal abscess with chronic MRSA infection, and polysubstance abuse.   In December 2022 he presented to [facility] with progressive dyspnea and was found to be markedly hypertensive. Labs showed normaocytic anemia with imld troponin elevated to a peak of 18. Echo showed normal LVEF with moderate LVH and G1DD. He was discharged home on amlodipine and and Coreg with recommendation for outpatient stress testing. He was readmitted to [facility] 16 in the setting of increased somnolence. He was found to have rhinovirus and worsening AKI. HE was noncompliant with his medications at home and amlodipine and coreg were resumed. ER visit January 27 with headaches and elevated BP. Labs showed stable CKD. MRI was unremarkable. He was discharged home.   Seen in follow-up and BP was very high and he was treated in the office with clonidine. Ischemic testing was deferred given elevated BP.   Last seen 01/2822 and he was started on hydralazine 50mg  TID.   Today, BP is better but still elevated. He checks BP at home, but does not remember. He has chest pain that sounds MSK in nature. He recently had a fistula placed for dialysis, waiting for it to mature. He has follow-up with [facility] next month. Breathing is short, however this is chronic and he smokes. He is wondering about pulmonary referral. He has OSA, but doesn't use CPAP, he lost it years ago. Says he used cocaine a couple months ago, nothing recent.   Past Medical  History:  Diagnosis Date   Abdominal abscess    a.) chronic MRSA infection   Acute pancreatitis    Anxiety    Asthma    Brain aneurysm    a.) 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 b.) RIGHT crainotomy for clip ligation 05/14/2019.   Chronic back pain    Chronic heart failure with preserved ejection fraction (HFpEF) ([place])    a. 11/2021 Echo: EF 55-60%, no rwma, mod LVH, GrI DD. Nl RV size/fxn. Mildly dil LA.   CKD (chronic kidney disease), stage V (HCC)    Depression    Emphysema of lung (HCC)    Erectile dysfunction    Followed by palliative care service    GERD (gastroesophageal reflux disease)    History of methicillin resistant staphylococcus aureus (MRSA)    HLD (hyperlipidemia)    Hypertension    Mild cognitive impairment    MRSA (methicillin resistant Staphylococcus aureus)    Obesity    OSA (obstructive sleep apnea) 2013   a.) not compliant with nocturnal PAP therapy; CPAP machine "lost or stolen".   Panic disorder    Perforated bowel ([place]) 12/10/2005   Tempoary Colostomy Bag, Skin Graft for Abd wound   Polysubstance abuse ([place])    a.) ETOH, tobacco, marijuana, methamphetamines, cocaine, BZO, opioids.   Subarachnoid hemorrhage ([place]) 01/26/2010   a.) 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  T2DM (type 2 diabetes mellitus) ([place])    Tobacco abuse    Type 2 diabetes mellitus without complication, without long-term current use of insulin ([place]) 04/17/2016   Vitreous hemorrhage ([place]) 03/27/2011   Overview:  Bilateral; 01/2010, from [facility]     Past Surgical History:  Procedure Laterality Date   AV FISTULA PLACEMENT Left 05/24/2022   Procedure: ARTERIOVENOUS (AV) FISTULA CREATION ( RADIAL CEPHALIC);  Surgeon: Algernon Huxley, MD;  Location: [facility] ORS;  Service: Vascular;  Laterality: Left;   CEREBRAL ANEURYSM REPAIR Right 01/26/2010    Procedure: CEREBRAL ANEURYSM REPAIR (COIL EMBOLIZATION)   CEREBRAL ANEURYSM REPAIR Right 05/14/2019   Procedure: CRAINOTOMY FOR CEREBRAL ANEURYSM REPAIR (CLIP LIGATION)   COLON SURGERY  12/10/2005   colostomy bag placed s/p perforated bowel   COLONOSCOPY WITH PROPOFOL N/A 05/18/2020   Procedure: COLONOSCOPY WITH PROPOFOL;  Surgeon: Lin Landsman, MD;  Location: [place];  Service: Gastroenterology;  Laterality: N/A;   KNEE SURGERY Right    Perforated bowel      Current Medications: Current Meds  Medication Sig   albuterol (VENTOLIN HFA) 108 (90 Base) MCG/ACT inhaler Inhale 1-2 puffs into the lungs every 6 (six) hours as needed for wheezing or shortness of breath.   amLODipine (NORVASC) 10 MG tablet Take 1 tablet (10 mg total) by mouth daily.   budesonide-formoterol (SYMBICORT) 160-4.5 MCG/ACT inhaler Inhale 2 puffs into the lungs in the morning and at bedtime.   calcitRIOL (ROCALTROL) 0.25 MCG capsule Take 0.25 mcg by mouth daily.   carvedilol (COREG) 25 MG tablet Take 1 tablet (25 mg total) by mouth 2 (two) times daily with a meal.   diclofenac Sodium (VOLTAREN) 1 % GEL Apply 2 g topically 4 (four) times daily.   DULoxetine (CYMBALTA) 60 MG capsule Take 1 capsule (60 mg total) by mouth daily.   fenofibrate micronized (LOFIBRA) 67 MG capsule TAKE 1 CAPSULE EVERY DAY BEFORE BREAKFAST   gabapentin (NEURONTIN) 100 MG capsule Take 50 mg by mouth daily.   glucose blood (ONETOUCH ULTRA) test strip USE TO TEST BLOOD SUGAR DAILY   HYDROcodone-acetaminophen (NORCO/VICODIN) 5-325 MG tablet Take 2 tablets by mouth every 6 (six) hours as needed for moderate pain.   hydrOXYzine (ATARAX) 50 MG tablet TAKE ONE TABLET 3 TIMES DAILY AS NEEDED   lidocaine (LIDODERM) 5 % Place 1 patch onto the skin every 12 (twelve) hours. Remove & Discard patch within 12 hours or as directed by MD   pantoprazole (PROTONIX) 40 MG tablet Take 1 tablet (40 mg total) by mouth 2 (two) times daily as needed.    patiromer (VELTASSA) 8.4 g packet Take 8.4 g by mouth daily.   pregabalin (LYRICA) 50 MG capsule Take 1 capsule (50 mg total) by mouth 2 (two) times daily. (Patient taking differently: Take 50 mg by mouth daily.)   topiramate (TOPAMAX) 50 MG tablet Take 1 tablet (50 mg total) by mouth 2 (two) times daily.   traZODone (DESYREL) 100 MG tablet Take 100 mg by mouth at bedtime.   [DISCONTINUED] hydrALAZINE (APRESOLINE) 50 MG tablet Take 1 tablet (50 mg total) by mouth 2 (two) times daily.     Allergies:   Atorvastatin, Avelox [moxifloxacin hcl in nacl], Buprenorphine, Dilaudid [hydromorphone hcl], Fluoxetine, Levofloxacin, Morphine and related, Other, Suboxone [buprenorphine hcl-naloxone hcl], and Vancomycin   Social History   Socioeconomic History   Marital status: Divorced    Spouse name: Not on file   Number of children: Not on file   Years of education: Not  on file   Highest education level: Not on file  Occupational History   Not on file  Tobacco Use   Smoking status: Every Day    Packs/day: 0.50    Types: Cigarettes    Start date: 10/11/1984   Smokeless tobacco: Never  Vaping Use   Vaping Use: Never used  Substance and Sexual Activity   Alcohol use: Yes   Drug use: Not Currently    Types: "Crack" cocaine, Amphetamines, Methamphetamines, Benzodiazepines, Cocaine, Marijuana, Other-see comments    Comment: opioids   Sexual activity: Not Currently  Other Topics Concern   Not on file  Social History Narrative   Lives at home with sister.   Social Determinants of Health   Financial Resource Strain: Medium Risk (11/18/2019)   Overall Financial Resource Strain (CARDIA)    Difficulty of Paying Living Expenses: Somewhat hard  Food Insecurity: No Food Insecurity (11/18/2019)   Hunger Vital Sign    Worried About Running Out of Food in the Last Year: Never true    Ran Out of Food in the Last Year: Never true  Transportation Needs: Unmet Transportation Needs (11/18/2019)   PRAPARE -  Hydrologist (Medical): Yes    Lack of Transportation (Non-Medical): Yes  Physical Activity: Inactive (11/18/2019)   Exercise Vital Sign    Days of Exercise per Week: 0 days    Minutes of Exercise per Session: 0 min  Stress: No Stress Concern Present (11/18/2019)   [place]    Feeling of Stress : Not at all  Social Connections: Socially Isolated (11/18/2019)   Social Connection and Isolation Panel [NHANES]    Frequency of Communication with Friends and Family: More than three times a week    Frequency of Social Gatherings with Friends and Family: More than three times a week    Attends Religious Services: Never    Marine scientist or Organizations: No    Attends Music therapist: Never    Marital Status: Divorced     Family History: The patient's family history includes Anxiety disorder in his brother, father, mother, sister, and sister; Arthritis in his mother and sister; Asthma in his mother and sister; Depression in his brother, father, mother, sister, and sister; Diabetes in his father, mother, and sister; Heart disease in his brother, father, mother, and sister; Hyperlipidemia in his brother, mother, and sister; Hypertension in his brother, mother, and sister; Kidney disease in his mother; Lung disease in his mother and sister; Thyroid disease in his mother.  ROS:   Please see the history of present illness.     All other systems reviewed and are negative.  EKGs/Labs/Other Studies Reviewed:    The following studies were reviewed today:  Echo 11/2021  1. Left ventricular ejection fraction, by estimation, is 55 to 60%. The  left ventricle has normal function. The left ventricle has no regional  wall motion abnormalities. There is moderate left ventricular hypertrophy.  Left ventricular diastolic  parameters are consistent with Grade I diastolic dysfunction (impaired   relaxation).   2. Right ventricular systolic function is normal. The right ventricular  size is normal. Tricuspid regurgitation signal is inadequate for assessing  PA pressure.   3. Left atrial size was mildly dilated.   4. The mitral valve is normal in structure. No evidence of mitral valve  regurgitation. No evidence of mitral stenosis.   5. The aortic valve is normal in structure.  Aortic valve regurgitation is  not visualized. Aortic valve sclerosis is present, with no evidence of  aortic valve stenosis.   EKG:  EKG is not ordered today.   Recent Labs: 11/25/2021: B Natriuretic Peptide 549.9 11/26/2021: Magnesium 2.0 02/20/2022: ALT 11 05/21/2022: BUN 48; Creatinine, Ser 4.53; Hemoglobin 11.9; Platelets 290; Potassium 3.9; Sodium 141  Recent Lipid Panel    Component Value Date/Time   CHOL 161 04/17/2021 1548   TRIG 187 (H) 04/17/2021 1548   HDL 24 (L) 04/17/2021 1548   CHOLHDL 6.7 (H) 04/17/2021 1548   LDLCALC 104 (H) 04/17/2021 1548    Physical Exam:    VS:  BP (!) 150/70 (BP Location: Right Arm, Patient Position: Sitting, Cuff Size: Large)   Pulse 72   Ht 5' 7.5" (1.715 m)   Wt 218 lb 6.4 oz (99.1 kg)   SpO2 96%   BMI 33.70 kg/m     Wt Readings from Last 3 Encounters:  05/29/22 218 lb 6.4 oz (99.1 kg)  05/24/22 212 lb (96.2 kg)  05/15/22 218 lb 9.6 oz (99.2 kg)     GEN:  Well nourished, well developed in no acute distress HEENT: Normal NECK: No JVD; No carotid bruits LYMPHATICS: No lymphadenopathy CARDIAC: RRR, no murmurs, rubs, gallops RESPIRATORY:  diffusely diminished ABDOMEN: Soft, non-tender, non-distended MUSCULOSKELETAL:  No edema; No deformity  SKIN: Warm and dry NEUROLOGIC:  Alert and oriented x 3 PSYCHIATRIC:  Normal affect   ASSESSMENT:    1. Essential hypertension   2. OSA (obstructive sleep apnea)   3. CKD (chronic kidney disease), stage IV (HCC)   4. Elevated troponin   5. Chronic obstructive pulmonary disease, unspecified COPD type  ([place])   6. Chest pain, unspecified type    PLAN:    In order of problems listed above:  HTN BP is better but still elevated today. I will increase Hydralazine to 100mg  BID. Continue Coreg 25mg BID and amlodipine 10mg  daily.   OSA COPD/tobacco use He is non-compliant with CPAP, said he lost it years ago. He also smokes and has COPD. I will refer to pulmonology for further treatment.  CKD stage 5 He is currently undergoing fistula placement for permanent dialysis access.  Polysubstance use He was counseled on complete cessation.   Demand ischemia BP is better, so I will order a Myoview Lexsican to evaluate for ischemia. He denies significant chest pain. He has chronic SOB. Can consider Aspirin at follow-up. He would also likely benefit from a statin, LDL 140 12/2021. Continue BB therapy.   Disposition: Follow up in 3 month(s) with MD/APP     Signed, Jonique Kulig Ninfa Meeker, PA-C  05/29/2022 2:07 PM    [place]

## 2022-05-30 ENCOUNTER — Telehealth: Payer: Self-pay | Admitting: Nurse Practitioner

## 2022-05-30 NOTE — Telephone Encounter (Signed)
Copied from [place] [phone]. Topic: Appointment Scheduling - Scheduling Inquiry for Clinic >> May 29, 2022  8:35 AM Leitha Schuller wrote: Reason for CRM: Patient's sister requesting a cb to r/s 6-20 awv  Please assist further

## 2022-05-31 ENCOUNTER — Telehealth (INDEPENDENT_AMBULATORY_CARE_PROVIDER_SITE_OTHER): Payer: Self-pay

## 2022-05-31 ENCOUNTER — Other Ambulatory Visit (INDEPENDENT_AMBULATORY_CARE_PROVIDER_SITE_OTHER): Payer: Self-pay | Admitting: Nurse Practitioner

## 2022-05-31 MED ORDER — DOXYCYCLINE HYCLATE 100 MG PO CAPS: 100.0000 mg | ORAL_CAPSULE | Freq: Two times a day (BID) | ORAL | 0 refills | Status: DC

## 2022-05-31 NOTE — Telephone Encounter (Signed)
The redness and blisters are around the incision. The patient sister notice the blisters on Tuesday and they had the PA look at the area during his appointment. The provider didn't think it was infected but recommended the patient to contact our office if the pain continues. The patient patient sister will send pictures though mychart

## 2022-06-04 NOTE — Telephone Encounter (Signed)
Patient sister was made aware with medical recommendations and verbalized understanding 

## 2022-06-08 NOTE — Telephone Encounter (Signed)
Called [name] back to let her know that we did get the picture and it was clear this time. Also let her know the provider is aware of the situation and if she is not contacted by end of business today and the arm starts to feel hot to the touch, the area of redness is getting bigger or any drainage or foul smells over the holiday weekend the pt should be taken to the ED to be evaluated.  [name] stated understanding

## 2022-06-08 NOTE — Telephone Encounter (Signed)
Pt's sister states that pt's arm looks swollen and red may have busted open but he is a picker and could have been picking at the area she is not sure.  He has been on antibiotics and should finish them today.  I asked her to get a better picture and send it through my chart if possible.  She just wants to make sure he does not need to be seen since it is Friday.

## 2022-06-11 ENCOUNTER — Ambulatory Visit (INDEPENDENT_AMBULATORY_CARE_PROVIDER_SITE_OTHER): Payer: Medicare Other | Admitting: *Deleted

## 2022-06-11 DIAGNOSIS — Z0000 Encounter for general adult medical examination without abnormal findings: Secondary | ICD-10-CM

## 2022-06-11 NOTE — Patient Instructions (Addendum)
Derek Cooley , Thank you for taking time to come for your Medicare Wellness Visit. I appreciate your ongoing commitment to your health goals. Please review the following plan we discussed and let me know if I can assist you in the future.   Screening recommendations/referrals: Colonoscopy: up to date Recommended yearly ophthalmology/optometry visit for glaucoma screening and checkup Recommended yearly dental visit for hygiene and checkup  Vaccinations: Influenza vaccine: Education provided Pneumococcal vaccine: up to date Tdap vaccine: up to date Shingles vaccine: Education provided    Advanced directives: Education provided  Conditions/risks identified:   Preventive Care 40-64 Years and Older, Male Preventive care refers to lifestyle choices and visits with your health care provider that can promote health and wellness. What does preventive care include? A yearly physical exam. This is also called an annual well check. Dental exams once or twice a year. Routine eye exams. Ask your health care provider how often you should have your eyes checked. Personal lifestyle choices, including: Daily care of your teeth and gums. Regular physical activity. Eating a healthy diet. Avoiding tobacco and drug use. Limiting alcohol use. Practicing safe sex. Taking low doses of aspirin every day. Taking vitamin and mineral supplements as recommended by your health care provider. What happens during an annual well check? The services and screenings done by your health care provider during your annual well check will depend on your age, overall health, lifestyle risk factors, and family history of disease. Counseling  Your health care provider may ask you questions about your: Alcohol use. Tobacco use. Drug use. Emotional well-being. Home and relationship well-being. Sexual activity. Eating habits. History of falls. Memory and ability to understand (cognition). Work and work  Statistician. Screening  You may have the following tests or measurements: Height, weight, and BMI. Blood pressure. Lipid and cholesterol levels. These may be checked every 5 years, or more frequently if you are over 34 years old. Skin check. Lung cancer screening. You may have this screening every year starting at age 11 if you have a 30-pack-year history of smoking and currently smoke or have quit within the past 15 years. Fecal occult blood test (FOBT) of the stool. You may have this test every year starting at age 45. Flexible sigmoidoscopy or colonoscopy. You may have a sigmoidoscopy every 5 years or a colonoscopy every 10 years starting at age 77. Prostate cancer screening. Recommendations will vary depending on your family history and other risks. Hepatitis C blood test. Hepatitis B blood test. Sexually transmitted disease (STD) testing. Diabetes screening. This is done by checking your blood sugar (glucose) after you have not eaten for a while (fasting). You may have this done every 1-3 years. Abdominal aortic aneurysm (AAA) screening. You may need this if you are a current or former smoker. Osteoporosis. You may be screened starting at age 45 if you are at high risk. Talk with your health care provider about your test results, treatment options, and if necessary, the need for more tests. Vaccines  Your health care provider may recommend certain vaccines, such as: Influenza vaccine. This is recommended every year. Tetanus, diphtheria, and acellular pertussis (Tdap, Td) vaccine. You may need a Td booster every 10 years. Zoster vaccine. You may need this after age 95. Pneumococcal 13-valent conjugate (PCV13) vaccine. One dose is recommended after age 61. Pneumococcal polysaccharide (PPSV23) vaccine. One dose is recommended after age 43. Talk to your health care provider about which screenings and vaccines you need and how often you need them.  This information is not intended to replace  advice given to you by your health care provider. Make sure you discuss any questions you have with your health care provider. Document Released: 12/23/2015 Document Revised: 08/15/2016 Document Reviewed: 09/27/2015 Elsevier Interactive Patient Education  2017 [employer] Prevention in the Home Falls can cause injuries. They can happen to people of all ages. There are many things you can do to make your home safe and to help prevent falls. What can I do on the outside of my home? Regularly fix the edges of walkways and driveways and fix any cracks. Remove anything that might make you trip as you walk through a door, such as a raised step or threshold. Trim any bushes or trees on the path to your home. Use bright outdoor lighting. Clear any walking paths of anything that might make someone trip, such as rocks or tools. Regularly check to see if handrails are loose or broken. Make sure that both sides of any steps have handrails. Any raised decks and porches should have guardrails on the edges. Have any leaves, snow, or ice cleared regularly. Use sand or salt on walking paths during winter. Clean up any spills in your garage right away. This includes oil or grease spills. What can I do in the bathroom? Use night lights. Install grab bars by the toilet and in the tub and shower. Do not use towel bars as grab bars. Use non-skid mats or decals in the tub or shower. If you need to sit down in the shower, use a plastic, non-slip stool. Keep the floor dry. Clean up any water that spills on the floor as soon as it happens. Remove soap buildup in the tub or shower regularly. Attach bath mats securely with double-sided non-slip rug tape. Do not have throw rugs and other things on the floor that can make you trip. What can I do in the bedroom? Use night lights. Make sure that you have a light by your bed that is easy to reach. Do not use any sheets or blankets that are too big for your bed.  They should not hang down onto the floor. Have a firm chair that has side arms. You can use this for support while you get dressed. Do not have throw rugs and other things on the floor that can make you trip. What can I do in the kitchen? Clean up any spills right away. Avoid walking on wet floors. Keep items that you use a lot in easy-to-reach places. If you need to reach something above you, use a strong step stool that has a grab bar. Keep electrical cords out of the way. Do not use floor polish or wax that makes floors slippery. If you must use wax, use non-skid floor wax. Do not have throw rugs and other things on the floor that can make you trip. What can I do with my stairs? Do not leave any items on the stairs. Make sure that there are handrails on both sides of the stairs and use them. Fix handrails that are broken or loose. Make sure that handrails are as long as the stairways. Check any carpeting to make sure that it is firmly attached to the stairs. Fix any carpet that is loose or worn. Avoid having throw rugs at the top or bottom of the stairs. If you do have throw rugs, attach them to the floor with carpet tape. Make sure that you have a light switch at the  top of the stairs and the bottom of the stairs. If you do not have them, ask someone to add them for you. What else can I do to help prevent falls? Wear shoes that: Do not have high heels. Have rubber bottoms. Are comfortable and fit you well. Are closed at the toe. Do not wear sandals. If you use a stepladder: Make sure that it is fully opened. Do not climb a closed stepladder. Make sure that both sides of the stepladder are locked into place. Ask someone to hold it for you, if possible. Clearly mark and make sure that you can see: Any grab bars or handrails. First and last steps. Where the edge of each step is. Use tools that help you move around (mobility aids) if they are needed. These  include: Canes. Walkers. Scooters. Crutches. Turn on the lights when you go into a dark area. Replace any light bulbs as soon as they burn out. Set up your furniture so you have a clear path. Avoid moving your furniture around. If any of your floors are uneven, fix them. If there are any pets around you, be aware of where they are. Review your medicines with your doctor. Some medicines can make you feel dizzy. This can increase your chance of falling. Ask your doctor what other things that you can do to help prevent falls. This information is not intended to replace advice given to you by your health care provider. Make sure you discuss any questions you have with your health care provider. Document Released: 09/22/2009 Document Revised: 05/03/2016 Document Reviewed: 12/31/2014 Elsevier Interactive Patient Education  2017 [employer].

## 2022-06-11 NOTE — Progress Notes (Signed)
Subjective:   Derek Cooley. And his sister Derek Cooley is a 56 y.o. male who presents for Medicare Annual/Subsequent preventive examination.  I connected with  Derek Cooley. on 06/11/22 by a telephone enabled telemedicine application and verified that I am speaking with the correct person using two identifiers.   I discussed the limitations of evaluation and management by telemedicine. The patient expressed understanding and agreed to proceed.  Patient location: home  Provider location: Tele-Health-home    Review of Systems     Cardiac Risk Factors include: advanced age (>24men, >42 women);diabetes mellitus;male gender;obesity (BMI >30kg/m2);sedentary lifestyle;family history of premature cardiovascular disease     Objective:    Today's Vitals   06/11/22 1233  PainSc: 6    There is no height or weight on file to calculate BMI.     06/11/2022   12:43 PM 05/24/2022   11:33 AM 05/18/2022    4:40 PM 01/05/2022    9:16 AM 11/27/2021   12:45 AM 11/25/2021   11:10 AM 05/18/2020    8:05 AM  Advanced Directives  Does Patient Have a Medical Advance Directive? No No No No  No No  Would patient like information on creating a medical advance directive? No - Patient declined  No - Patient declined  No - Patient declined      Current Medications (verified) Outpatient Encounter Medications as of 06/11/2022  Medication Sig   albuterol (VENTOLIN HFA) 108 (90 Base) MCG/ACT inhaler Inhale 1-2 puffs into the lungs every 6 (six) hours as needed for wheezing or shortness of breath.   amLODipine (NORVASC) 10 MG tablet Take 1 tablet (10 mg total) by mouth daily.   budesonide-formoterol (SYMBICORT) 160-4.5 MCG/ACT inhaler Inhale 2 puffs into the lungs in the morning and at bedtime.   calcitRIOL (ROCALTROL) 0.25 MCG capsule Take 0.25 mcg by mouth daily.   carvedilol (COREG) 25 MG tablet Take 1 tablet (25 mg total) by mouth 2 (two) times daily with a meal.   diclofenac Sodium (VOLTAREN) 1 % GEL  Apply 2 g topically 4 (four) times daily.   doxycycline (VIBRAMYCIN) 100 MG capsule Take 1 capsule (100 mg total) by mouth 2 (two) times daily.   DULoxetine (CYMBALTA) 60 MG capsule Take 1 capsule (60 mg total) by mouth daily.   fenofibrate micronized (LOFIBRA) 67 MG capsule TAKE 1 CAPSULE EVERY DAY BEFORE BREAKFAST   gabapentin (NEURONTIN) 100 MG capsule Take 50 mg by mouth daily.   glucose blood (ONETOUCH ULTRA) test strip USE TO TEST BLOOD SUGAR DAILY   hydrALAZINE (APRESOLINE) 100 MG tablet Take 1 tablet (100 mg total) by mouth 2 (two) times daily.   hydrOXYzine (ATARAX) 50 MG tablet TAKE ONE TABLET 3 TIMES DAILY AS NEEDED   lidocaine (LIDODERM) 5 % Place 1 patch onto the skin every 12 (twelve) hours. Remove & Discard patch within 12 hours or as directed by MD   pantoprazole (PROTONIX) 40 MG tablet Take 1 tablet (40 mg total) by mouth 2 (two) times daily as needed.   patiromer (VELTASSA) 8.4 g packet Take 8.4 g by mouth daily.   pregabalin (LYRICA) 50 MG capsule Take 1 capsule (50 mg total) by mouth 2 (two) times daily. (Patient taking differently: Take 50 mg by mouth daily.)   topiramate (TOPAMAX) 50 MG tablet Take 1 tablet (50 mg total) by mouth 2 (two) times daily.   traZODone (DESYREL) 100 MG tablet Take 100 mg by mouth at bedtime.   HYDROcodone-acetaminophen (NORCO/VICODIN) 5-325 MG tablet  Take 2 tablets by mouth every 6 (six) hours as needed for moderate pain. (Patient not taking: Reported on 06/11/2022)   No facility-administered encounter medications on file as of 06/11/2022.    Allergies (verified) Atorvastatin, Avelox [moxifloxacin hcl in nacl], Buprenorphine, Dilaudid [hydromorphone hcl], Fluoxetine, Levofloxacin, Morphine and related, Other, Suboxone [buprenorphine hcl-naloxone hcl], and Vancomycin   History: Past Medical History:  Diagnosis Date   Abdominal abscess    a.) chronic MRSA infection   Acute pancreatitis    Anxiety    Asthma    Brain aneurysm    a.) spontaneous  rupture --> SAH from RIGHT PComm --> coil embolization 01/26/2010 with known remaining neck remnant. b.) RIGHT crainotomy for clip ligation 05/14/2019.   Chronic back pain    Chronic heart failure with preserved ejection fraction (HFpEF) ([place])    a. 11/2021 Echo: EF 55-60%, no rwma, mod LVH, GrI DD. Nl RV size/fxn. Mildly dil LA.   CKD (chronic kidney disease), stage V (HCC)    Depression    Emphysema of lung (HCC)    Erectile dysfunction    Followed by palliative care service    GERD (gastroesophageal reflux disease)    History of methicillin resistant staphylococcus aureus (MRSA)    HLD (hyperlipidemia)    Hypertension    Mild cognitive impairment    MRSA (methicillin resistant Staphylococcus aureus)    Obesity    OSA (obstructive sleep apnea) 2013   a.) not compliant with nocturnal PAP therapy; CPAP machine "lost or stolen".   Panic disorder    Perforated bowel ([place]) 12/10/2005   Tempoary Colostomy Bag, Skin Graft for Abd wound   Polysubstance abuse ([place])    a.) ETOH, tobacco, marijuana, methamphetamines, cocaine, BZO, opioids.   Subarachnoid hemorrhage ([place]) 01/26/2010   a.) 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   T2DM (type 2 diabetes mellitus) ([place])    Tobacco abuse    Type 2 diabetes mellitus without complication, without long-term current use of insulin ([place]) 04/17/2016   Vitreous hemorrhage ([place]) 03/27/2011   Overview:  Bilateral; 01/2010, from [facility]    Past Surgical History:  Procedure Laterality Date   AV FISTULA PLACEMENT Left 05/24/2022   Procedure: ARTERIOVENOUS (AV) FISTULA CREATION ( RADIAL CEPHALIC);  Surgeon: Algernon Huxley, MD;  Location: [facility] OR[facility];  Service: Vascular;  Laterality: Left;   CEREBRAL ANEURYSM REPAIR Right 01/26/2010   Procedure: CEREBRAL ANEURYSM REPAIR (COIL EMBOLIZATION)   CEREBRAL ANEURYSM REPAIR Right 05/14/2019   Procedure: CRAINOTOMY FOR CEREBRAL ANEURYSM REPAIR (CLIP  LIGATION)   COLON SURGERY  12/10/2005   colostomy bag placed s/p perforated bowel   COLONOSCOPY WITH PROPOFOL N/A 05/18/2020   Procedure: COLONOSCOPY WITH PROPOFOL;  Surgeon: Lin Landsman, MD;  Location: [place];  Service: Gastroenterology;  Laterality: N/A;   KNEE SURGERY Right    Perforated bowel     Family History  Problem Relation Age of Onset   Arthritis Mother    Asthma Mother    Diabetes Mother    Heart disease Mother    Hyperlipidemia Mother    Hypertension Mother    Kidney disease Mother    Thyroid disease Mother    Lung disease Mother    Anxiety disorder Mother    Depression Mother    Diabetes Father    Heart disease Father    Depression Father    Anxiety disorder Father    Arthritis Sister    Asthma Sister  Hyperlipidemia Sister    Hypertension Sister    Lung disease Sister    Anxiety disorder Sister    Depression Sister    Hyperlipidemia Brother    Hypertension Brother    Diabetes Sister    Heart disease Sister    Depression Sister    Anxiety disorder Sister    Anxiety disorder Brother    Depression Brother    Heart disease Brother    Social History   Socioeconomic History   Marital status: Divorced    Spouse name: Not on file   Number of children: Not on file   Years of education: Not on file   Highest education level: Not on file  Occupational History   Not on file  Tobacco Use   Smoking status: Every Day    Packs/day: 0.50    Types: Cigarettes    Start date: 10/11/1984   Smokeless tobacco: Never  Vaping Use   Vaping Use: Never used  Substance and Sexual Activity   Alcohol use: Yes   Drug use: Not Currently    Types: "Crack" cocaine, Amphetamines, Methamphetamines, Benzodiazepines, Cocaine, Marijuana, Other-see comments    Comment: opioids   Sexual activity: Not Currently  Other Topics Concern   Not on file  Social History Narrative   Lives at home with sister.   Social Determinants of Health   Financial Resource  Strain: Medium Risk (06/11/2022)   Overall Financial Resource Strain (CARDIA)    Difficulty of Paying Living Expenses: Somewhat hard  Food Insecurity: No Food Insecurity (06/11/2022)   Hunger Vital Sign    Worried About Running Out of Food in the Last Year: Never true    Ran Out of Food in the Last Year: Never true  Transportation Needs: Unmet Transportation Needs (06/11/2022)   PRAPARE - Hydrologist (Medical): Yes    Lack of Transportation (Non-Medical): No  Physical Activity: Inactive (06/11/2022)   Exercise Vital Sign    Days of Exercise per Week: 0 days    Minutes of Exercise per Session: 0 min  Stress: No Stress Concern Present (06/11/2022)   [place]    Feeling of Stress : Not at all  Social Connections: Socially Isolated (06/11/2022)   Social Connection and Isolation Panel [NHANES]    Frequency of Communication with Friends and Family: More than three times a week    Frequency of Social Gatherings with Friends and Family: More than three times a week    Attends Religious Services: Never    Marine scientist or Organizations: No    Attends Music therapist: Never    Marital Status: Divorced    Tobacco Counseling Ready to quit: Not Answered Counseling given: Not Answered   Clinical Intake:  Pre-visit preparation completed: Yes  Pain : 0-10 Pain Score: 6  Pain Type: Chronic pain Pain Location: Knee Pain Orientation: Left, Right Pain Descriptors / Indicators: Constant, Burning, Aching Pain Onset: More than a month ago Pain Frequency: Constant Pain Relieving Factors: tylenol  Pain Relieving Factors: tylenol  Nutritional Risks: None Diabetes: Yes CBG done?: No Did pt. bring in CBG monitor from home?: No  How often do you need to have someone help you when you read instructions, pamphlets, or other written materials from your doctor or pharmacy?: 5 -  Always  Diabetic?  Yes  Nutrition Risk Assessment:  Has the patient had any N/V/D within the last 2 months?  No  Does the patient have any non-healing wounds?  No  Has the patient had any unintentional weight loss or weight gain?  No   Diabetes:  Is the patient diabetic?  Yes  If diabetic, was a CBG obtained today?  No  Did the patient bring in their glucometer from home?  No  How often do you monitor your CBG's? Patient states his meter just broke and his sister is going to call office for a replacement.   Financial Strains and Diabetes Management:  Are you having any financial strains with the device, your supplies or your medication? No .  Does the patient want to be seen by Chronic Care Management for management of their diabetes?  No  Would the patient like to be referred to a Nutritionist or for Diabetic Management?  No   Diabetic Exams:  Diabetic Eye Exam: . Overdue for diabetic eye exam. Pt has been advised about the importance in completing this exam.  Diabetic Foot Exam. Pt has been advised about the importance in completing this exam.   Interpreter Needed?: No  Information entered by :: Leroy Kennedy LPN   Activities of Daily Living    06/11/2022   12:41 PM 05/18/2022    5:20 PM  In your present state of health, do you have any difficulty performing the following activities:  Hearing? 0 0  Vision? 0 0  Difficulty concentrating or making decisions? 1 0  Walking or climbing stairs? 1 0  Dressing or bathing? 0 0  Doing errands, shopping? 1   Preparing Food and eating ? N   Using the Toilet? N   In the past six months, have you accidently leaked urine? Y   Do you have problems with loss of bowel control? N   Managing your Medications? Y   Managing your Finances? Y   Housekeeping or managing your Housekeeping? Y     Patient Care Team: Jon Billings, NP as PCP - General Wellington Hampshire, MD as PCP - Cardiology (Cardiology) Vladimir Faster, [facility] (Inactive)  (Pharmacist) Jason Coop, NP as Nurse Practitioner (Hospice and Palliative Medicine) Anthonette Legato, MD (Nephrology) Mckinley Jewel, MD as Consulting Physician (Neurology)  Indicate any recent Medical Services you may have received from other than [facility] providers in the past year (date may be approximate).     Assessment:   This is a routine wellness examination for Mar[place]o.  Hearing/Vision screen Hearing Screening - Comments:: No trouble hearing Vision Screening - Comments:: Not up to date  Dietary issues and exercise activities discussed: Current Exercise Habits: The patient does not participate in regular exercise at present, Exercise limited by: orthopedic condition(s)   Goals Addressed             This Visit's Progress    Quit Smoking         Depression Screen    06/11/2022   12:51 PM 02/02/2022    3:01 PM 12/07/2021    3:28 PM 07/19/2020   10:13 AM 03/02/2020    3:56 PM 11/18/2019    1:59 PM 06/06/2018    8:31 AM  PHQ 2/9 Scores  PHQ - 2 Score 2 6 6 6 4  0 6  PHQ- 9 Score 14 24 23 17 21  23     Fall Risk    02/02/2022    3:01 PM 12/07/2021    3:14 PM 03/02/2020    3:56 PM 11/18/2019    1:55 PM 06/29/2019   11:26 AM  Fall Risk  Falls in the past year? 1 1 1 1  0  Number falls in past yr: 1 0 0 0 0  Injury with Fall? 0 1 0 0 0  Risk for fall due to : History of fall(s);Impaired balance/gait Impaired balance/gait     Follow up Falls evaluation completed Falls evaluation completed       FALL RISK PREVENTION PERTAINING TO THE HOME:  Any stairs in or around the home? Yes  If so, are there any without handrails? No  Home free of loose throw rugs in walkways, pet beds, electrical cords, etc? Yes  Adequate lighting in your home to reduce risk of falls? Yes   ASSISTIVE DEVICES UTILIZED TO PREVENT FALLS:  Life alert? No  Use of a cane, walker or w/c? No  Grab bars in the bathroom? No  Shower chair or bench in shower? Yes  Elevated toilet seat or a  handicapped toilet? No   TIMED UP AND GO:  Was the test performed? No .     Cognitive Function:        06/11/2022   12:35 PM 11/18/2019    2:01 PM 10/09/2017   12:02 PM  6CIT Screen  What Year? 0 points 0 points 0 points  What month? 0 points 0 points 0 points  What time? 3 points 0 points 0 points  Count back from 20 2 points 0 points 0 points  Months in reverse 0 points 0 points 0 points  Repeat phrase 0 points 0 points 10 points  Total Score 5 points 0 points 10 points    Immunizations Immunization History  Administered Date(s) Administered   Influenza,inj,Quad PF,6+ Mos 10/09/2017, 08/21/2019   Influenza-Unspecified 09/20/2014, 08/25/2015   Pneumococcal Polysaccharide-23 09/03/2011, 10/06/2012, 08/15/2015   Tdap 06/04/2016    TDAP status: Up to date  Flu Vaccine status: Due, Education has been provided regarding the importance of this vaccine. Advised may receive this vaccine at local pharmacy or Health Dept. Aware to provide a copy of the vaccination record if obtained from local pharmacy or Health Dept. Verbalized acceptance and understanding.  Pneumococcal vaccine status: Up to date  Covid-19 vaccine status: Information provided on how to obtain vaccines.   Qualifies for Shingles Vaccine? Yes   Zostavax completed No   Shingrix Completed?: No.    Education has been provided regarding the importance of this vaccine. Patient has been advised to call insurance company to determine out of pocket expense if they have not yet received this vaccine. Advised may also receive vaccine at local pharmacy or Health Dept. Verbalized acceptance and understanding.  Screening Tests Health Maintenance  Topic Date Due   FOOT EXAM  07/08/2019   OPHTHALMOLOGY EXAM  06/30/2020   HEMOGLOBIN A1C  05/27/2022   COVID-19 Vaccine (1) 06/27/2022 (Originally 05/26/1967)   Zoster Vaccines- Shingrix (1 of 2) 09/11/2022 (Originally [birth year]/25/1984)   INFLUENZA VACCINE  07/10/2022   TETANUS/TDAP   06/04/2026   COLONOSCOPY (Pts 45-29yrs Insurance coverage will need to be confirmed)  05/18/2030   Hepatitis C Screening  Completed   HIV Screening  Completed   HPV VACCINES  Aged Out    Health Maintenance  Health Maintenance Due  Topic Date Due   FOOT EXAM  07/08/2019   OPHTHALMOLOGY EXAM  06/30/2020   HEMOGLOBIN A1C  05/27/2022    Colorectal cancer screening: Type of screening: Colonoscopy. Completed 2021. Repeat every 10 years  Lung Cancer Screening: (Low Dose CT Chest recommended if Age 75-80 years, 30 pack-year currently smoking OR  have quit w/in 15years.) does not qualify.   Lung Cancer Screening Referral:   Additional Screening:  Hepatitis C Screening: does not qualify; Completed 2022  Vision Screening: Recommended annual ophthalmology exams for early detection of glaucoma and other disorders of the eye. Is the patient up to date with their annual eye exam?  No  Who is the provider or what is the name of the office in which the patient attends annual eye exams? [facility] If pt is not established with a provider, would they like to be referred to a provider to establish care? No .   Dental Screening: Recommended annual dental exams for proper oral hygiene  Community Resource Referral / Chronic Care Management: CRR required this visit?  No   CCM required this visit?  No      Plan:     I have personally reviewed and noted the following in the patient's chart:   Medical and social history Use of alcohol, tobacco or illicit drugs  Current medications and supplements including opioid prescriptions. Patient is currently taking opioid prescriptions. Information provided to patient regarding non-opioid alternatives. Patient advised to discuss non-opioid treatment plan with their provider. Functional ability and status Nutritional status Physical activity Advanced directives List of other physicians Hospitalizations, surgeries, and ER visits in previous 12  months Vitals Screenings to include cognitive, depression, and falls Referrals and appointments  In addition, I have reviewed and discussed with patient certain preventive protocols, quality metrics, and best practice recommendations. A written personalized care plan for preventive services as well as general preventive health recommendations were provided to patient.     Leroy Kennedy, LPN   04/14/3747   Nurse Notes:  Spoke with patient sister and patient they do not have a meter it has broken, they stated they will be calling the office to have a replacement sent in.

## 2022-06-22 ENCOUNTER — Emergency Department: Payer: Medicare Other

## 2022-06-22 DIAGNOSIS — J439 Emphysema, unspecified: Secondary | ICD-10-CM | POA: Diagnosis present

## 2022-06-22 DIAGNOSIS — E1122 Type 2 diabetes mellitus with diabetic chronic kidney disease: Secondary | ICD-10-CM | POA: Diagnosis present

## 2022-06-22 DIAGNOSIS — F32A Depression, unspecified: Secondary | ICD-10-CM | POA: Diagnosis present

## 2022-06-22 DIAGNOSIS — I700 Atherosclerosis of aorta: Secondary | ICD-10-CM | POA: Diagnosis present

## 2022-06-22 DIAGNOSIS — Z881 Allergy status to other antibiotic agents status: Secondary | ICD-10-CM

## 2022-06-22 DIAGNOSIS — I132 Hypertensive heart and chronic kidney disease with heart failure and with stage 5 chronic kidney disease, or end stage renal disease: Principal | ICD-10-CM | POA: Diagnosis present

## 2022-06-22 DIAGNOSIS — Z841 Family history of disorders of kidney and ureter: Secondary | ICD-10-CM

## 2022-06-22 DIAGNOSIS — M79602 Pain in left arm: Secondary | ICD-10-CM | POA: Diagnosis present

## 2022-06-22 DIAGNOSIS — Z818 Family history of other mental and behavioral disorders: Secondary | ICD-10-CM

## 2022-06-22 DIAGNOSIS — G4733 Obstructive sleep apnea (adult) (pediatric): Secondary | ICD-10-CM | POA: Diagnosis present

## 2022-06-22 DIAGNOSIS — E1169 Type 2 diabetes mellitus with other specified complication: Secondary | ICD-10-CM | POA: Diagnosis present

## 2022-06-22 DIAGNOSIS — E872 Acidosis, unspecified: Secondary | ICD-10-CM | POA: Diagnosis present

## 2022-06-22 DIAGNOSIS — Z833 Family history of diabetes mellitus: Secondary | ICD-10-CM

## 2022-06-22 DIAGNOSIS — F410 Panic disorder [episodic paroxysmal anxiety] without agoraphobia: Secondary | ICD-10-CM | POA: Diagnosis present

## 2022-06-22 DIAGNOSIS — Z8614 Personal history of Methicillin resistant Staphylococcus aureus infection: Secondary | ICD-10-CM

## 2022-06-22 DIAGNOSIS — I5043 Acute on chronic combined systolic (congestive) and diastolic (congestive) heart failure: Secondary | ICD-10-CM | POA: Diagnosis present

## 2022-06-22 DIAGNOSIS — Z91148 Patient's other noncompliance with medication regimen for other reason: Secondary | ICD-10-CM

## 2022-06-22 DIAGNOSIS — J9621 Acute and chronic respiratory failure with hypoxia: Secondary | ICD-10-CM | POA: Diagnosis present

## 2022-06-22 DIAGNOSIS — Z825 Family history of asthma and other chronic lower respiratory diseases: Secondary | ICD-10-CM

## 2022-06-22 DIAGNOSIS — Z8673 Personal history of transient ischemic attack (TIA), and cerebral infarction without residual deficits: Secondary | ICD-10-CM

## 2022-06-22 DIAGNOSIS — Z8249 Family history of ischemic heart disease and other diseases of the circulatory system: Secondary | ICD-10-CM

## 2022-06-22 DIAGNOSIS — Z7951 Long term (current) use of inhaled steroids: Secondary | ICD-10-CM

## 2022-06-22 DIAGNOSIS — Z8679 Personal history of other diseases of the circulatory system: Secondary | ICD-10-CM

## 2022-06-22 DIAGNOSIS — Z79899 Other long term (current) drug therapy: Secondary | ICD-10-CM

## 2022-06-22 DIAGNOSIS — I2510 Atherosclerotic heart disease of native coronary artery without angina pectoris: Secondary | ICD-10-CM | POA: Diagnosis present

## 2022-06-22 DIAGNOSIS — M545 Low back pain, unspecified: Secondary | ICD-10-CM | POA: Diagnosis present

## 2022-06-22 DIAGNOSIS — E7800 Pure hypercholesterolemia, unspecified: Secondary | ICD-10-CM | POA: Diagnosis present

## 2022-06-22 DIAGNOSIS — Z992 Dependence on renal dialysis: Secondary | ICD-10-CM

## 2022-06-22 DIAGNOSIS — I161 Hypertensive emergency: Secondary | ICD-10-CM | POA: Diagnosis present

## 2022-06-22 DIAGNOSIS — D631 Anemia in chronic kidney disease: Secondary | ICD-10-CM | POA: Diagnosis present

## 2022-06-22 DIAGNOSIS — Z83438 Family history of other disorder of lipoprotein metabolism and other lipidemia: Secondary | ICD-10-CM

## 2022-06-22 DIAGNOSIS — Z8261 Family history of arthritis: Secondary | ICD-10-CM

## 2022-06-22 DIAGNOSIS — I5A Non-ischemic myocardial injury (non-traumatic): Secondary | ICD-10-CM | POA: Diagnosis present

## 2022-06-22 DIAGNOSIS — Z8349 Family history of other endocrine, nutritional and metabolic diseases: Secondary | ICD-10-CM

## 2022-06-22 DIAGNOSIS — G8929 Other chronic pain: Secondary | ICD-10-CM | POA: Diagnosis present

## 2022-06-22 DIAGNOSIS — F17210 Nicotine dependence, cigarettes, uncomplicated: Secondary | ICD-10-CM | POA: Diagnosis present

## 2022-06-22 DIAGNOSIS — Z888 Allergy status to other drugs, medicaments and biological substances status: Secondary | ICD-10-CM

## 2022-06-22 DIAGNOSIS — R079 Chest pain, unspecified: Secondary | ICD-10-CM | POA: Diagnosis not present

## 2022-06-22 DIAGNOSIS — K219 Gastro-esophageal reflux disease without esophagitis: Secondary | ICD-10-CM | POA: Diagnosis present

## 2022-06-22 DIAGNOSIS — Z885 Allergy status to narcotic agent status: Secondary | ICD-10-CM

## 2022-06-22 DIAGNOSIS — I152 Hypertension secondary to endocrine disorders: Secondary | ICD-10-CM | POA: Diagnosis present

## 2022-06-22 LAB — COMPREHENSIVE METABOLIC PANEL
ALT: 14 U/L (ref 0–44)
AST: 18 U/L (ref 15–41)
Albumin: 3.9 g/dL (ref 3.5–5.0)
Alkaline Phosphatase: 75 U/L (ref 38–126)
Anion gap: 8 (ref 5–15)
BUN: 55 mg/dL — ABNORMAL HIGH (ref 6–20)
CO2: 23 mmol/L (ref 22–32)
Calcium: 8.6 mg/dL — ABNORMAL LOW (ref 8.9–10.3)
Chloride: 111 mmol/L (ref 98–111)
Creatinine, Ser: 4.37 mg/dL — ABNORMAL HIGH (ref 0.61–1.24)
GFR, Estimated: 15 mL/min — ABNORMAL LOW (ref >60)
Glucose, Bld: 144 mg/dL — ABNORMAL HIGH (ref 70–99)
Potassium: 4 mmol/L (ref 3.5–5.1)
Sodium: 142 mmol/L (ref 135–145)
Total Bilirubin: 0.6 mg/dL (ref 0.3–1.2)
Total Protein: 6.9 g/dL (ref 6.5–8.1)

## 2022-06-22 LAB — CBC WITH DIFFERENTIAL/PLATELET
Abs Immature Granulocytes: 0.08 10*3/uL — ABNORMAL HIGH (ref 0.00–0.07)
Basophils Absolute: 0.1 10*3/uL (ref 0.0–0.1)
Basophils Relative: 1 %
Eosinophils Absolute: 0.3 10*3/uL (ref 0.0–0.5)
Eosinophils Relative: 4 %
HCT: 32.4 % — ABNORMAL LOW (ref 39.0–52.0)
Hemoglobin: 10.3 g/dL — ABNORMAL LOW (ref 13.0–17.0)
Immature Granulocytes: 1 %
Lymphocytes Relative: 21 %
Lymphs Abs: 1.9 10*3/uL (ref 0.7–4.0)
MCH: 29.5 pg (ref 26.0–34.0)
MCHC: 31.8 g/dL (ref 30.0–36.0)
MCV: 92.8 fL (ref 80.0–100.0)
Monocytes Absolute: 0.8 10*3/uL (ref 0.1–1.0)
Monocytes Relative: 9 %
Neutro Abs: 5.9 10*3/uL (ref 1.7–7.7)
Neutrophils Relative %: 64 %
Platelets: 229 10*3/uL (ref 150–400)
RBC: 3.49 MIL/uL — ABNORMAL LOW (ref 4.22–5.81)
RDW: 13.3 % (ref 11.5–15.5)
WBC: 9 10*3/uL (ref 4.0–10.5)
nRBC: 0 % (ref 0.0–0.2)

## 2022-06-22 LAB — TROPONIN I (HIGH SENSITIVITY): Troponin I (High Sensitivity): 30 ng/L — ABNORMAL HIGH (ref <18)

## 2022-06-22 NOTE — ED Triage Notes (Signed)
Pt presents with chest pain and shortness of breath. Suppose to be starting dialysis very soon.

## 2022-06-23 ENCOUNTER — Inpatient Hospital Stay (HOSPITAL_COMMUNITY)
Admit: 2022-06-23 | Discharge: 2022-06-23 | Disposition: A | Discharge status: Home / Self Care | Payer: Medicare Other | Attending: Cardiology | Admitting: Cardiology

## 2022-06-23 ENCOUNTER — Inpatient Hospital Stay
Admission: EM | Admit: 2022-06-23 | Discharge: 2022-07-02 | DRG: 291 | Disposition: A | Discharge status: Home / Self Care | Payer: Medicare Other | Attending: Internal Medicine | Admitting: Internal Medicine

## 2022-06-23 ENCOUNTER — Inpatient Hospital Stay: Payer: Medicare Other

## 2022-06-23 ENCOUNTER — Other Ambulatory Visit: Payer: Self-pay

## 2022-06-23 ENCOUNTER — Encounter: Payer: Self-pay | Admitting: Family Medicine

## 2022-06-23 DIAGNOSIS — N186 End stage renal disease: Secondary | ICD-10-CM

## 2022-06-23 DIAGNOSIS — G8929 Other chronic pain: Secondary | ICD-10-CM

## 2022-06-23 DIAGNOSIS — I5043 Acute on chronic combined systolic (congestive) and diastolic (congestive) heart failure: Secondary | ICD-10-CM | POA: Diagnosis present

## 2022-06-23 DIAGNOSIS — I2510 Atherosclerotic heart disease of native coronary artery without angina pectoris: Secondary | ICD-10-CM | POA: Diagnosis present

## 2022-06-23 DIAGNOSIS — I129 Hypertensive chronic kidney disease with stage 1 through stage 4 chronic kidney disease, or unspecified chronic kidney disease: Secondary | ICD-10-CM

## 2022-06-23 DIAGNOSIS — J9601 Acute respiratory failure with hypoxia: Secondary | ICD-10-CM | POA: Diagnosis present

## 2022-06-23 DIAGNOSIS — J439 Emphysema, unspecified: Secondary | ICD-10-CM | POA: Diagnosis present

## 2022-06-23 DIAGNOSIS — J441 Chronic obstructive pulmonary disease with (acute) exacerbation: Secondary | ICD-10-CM | POA: Diagnosis not present

## 2022-06-23 DIAGNOSIS — R079 Chest pain, unspecified: Secondary | ICD-10-CM | POA: Diagnosis present

## 2022-06-23 DIAGNOSIS — F1910 Other psychoactive substance abuse, uncomplicated: Secondary | ICD-10-CM | POA: Diagnosis present

## 2022-06-23 DIAGNOSIS — I5030 Unspecified diastolic (congestive) heart failure: Secondary | ICD-10-CM | POA: Diagnosis not present

## 2022-06-23 DIAGNOSIS — R072 Precordial pain: Secondary | ICD-10-CM

## 2022-06-23 DIAGNOSIS — F418 Other specified anxiety disorders: Secondary | ICD-10-CM | POA: Diagnosis present

## 2022-06-23 DIAGNOSIS — E8770 Fluid overload, unspecified: Secondary | ICD-10-CM | POA: Diagnosis present

## 2022-06-23 DIAGNOSIS — J9621 Acute and chronic respiratory failure with hypoxia: Secondary | ICD-10-CM

## 2022-06-23 DIAGNOSIS — M545 Low back pain, unspecified: Secondary | ICD-10-CM | POA: Diagnosis not present

## 2022-06-23 DIAGNOSIS — F32A Depression, unspecified: Secondary | ICD-10-CM | POA: Diagnosis present

## 2022-06-23 DIAGNOSIS — I5032 Chronic diastolic (congestive) heart failure: Secondary | ICD-10-CM | POA: Insufficient documentation

## 2022-06-23 DIAGNOSIS — I509 Heart failure, unspecified: Principal | ICD-10-CM

## 2022-06-23 DIAGNOSIS — I10 Essential (primary) hypertension: Secondary | ICD-10-CM | POA: Diagnosis present

## 2022-06-23 DIAGNOSIS — I161 Hypertensive emergency: Secondary | ICD-10-CM | POA: Diagnosis present

## 2022-06-23 DIAGNOSIS — Z992 Dependence on renal dialysis: Secondary | ICD-10-CM | POA: Diagnosis not present

## 2022-06-23 DIAGNOSIS — E785 Hyperlipidemia, unspecified: Secondary | ICD-10-CM | POA: Diagnosis not present

## 2022-06-23 DIAGNOSIS — F410 Panic disorder [episodic paroxysmal anxiety] without agoraphobia: Secondary | ICD-10-CM | POA: Diagnosis present

## 2022-06-23 DIAGNOSIS — Z888 Allergy status to other drugs, medicaments and biological substances status: Secondary | ICD-10-CM | POA: Diagnosis not present

## 2022-06-23 DIAGNOSIS — Z885 Allergy status to narcotic agent status: Secondary | ICD-10-CM | POA: Diagnosis not present

## 2022-06-23 DIAGNOSIS — I132 Hypertensive heart and chronic kidney disease with heart failure and with stage 5 chronic kidney disease, or end stage renal disease: Secondary | ICD-10-CM | POA: Diagnosis present

## 2022-06-23 DIAGNOSIS — I120 Hypertensive chronic kidney disease with stage 5 chronic kidney disease or end stage renal disease: Secondary | ICD-10-CM | POA: Diagnosis not present

## 2022-06-23 DIAGNOSIS — I5033 Acute on chronic diastolic (congestive) heart failure: Secondary | ICD-10-CM

## 2022-06-23 DIAGNOSIS — I5023 Acute on chronic systolic (congestive) heart failure: Secondary | ICD-10-CM | POA: Diagnosis not present

## 2022-06-23 DIAGNOSIS — D631 Anemia in chronic kidney disease: Secondary | ICD-10-CM | POA: Diagnosis present

## 2022-06-23 DIAGNOSIS — I5A Non-ischemic myocardial injury (non-traumatic): Secondary | ICD-10-CM | POA: Diagnosis present

## 2022-06-23 DIAGNOSIS — N185 Chronic kidney disease, stage 5: Secondary | ICD-10-CM | POA: Diagnosis present

## 2022-06-23 DIAGNOSIS — Z8679 Personal history of other diseases of the circulatory system: Secondary | ICD-10-CM | POA: Diagnosis not present

## 2022-06-23 DIAGNOSIS — E7800 Pure hypercholesterolemia, unspecified: Secondary | ICD-10-CM | POA: Diagnosis present

## 2022-06-23 DIAGNOSIS — N189 Chronic kidney disease, unspecified: Secondary | ICD-10-CM

## 2022-06-23 DIAGNOSIS — Z720 Tobacco use: Secondary | ICD-10-CM | POA: Diagnosis present

## 2022-06-23 DIAGNOSIS — I152 Hypertension secondary to endocrine disorders: Secondary | ICD-10-CM | POA: Diagnosis present

## 2022-06-23 DIAGNOSIS — R062 Wheezing: Secondary | ICD-10-CM | POA: Diagnosis not present

## 2022-06-23 DIAGNOSIS — E872 Acidosis, unspecified: Secondary | ICD-10-CM | POA: Diagnosis present

## 2022-06-23 DIAGNOSIS — I119 Hypertensive heart disease without heart failure: Secondary | ICD-10-CM | POA: Diagnosis present

## 2022-06-23 DIAGNOSIS — I259 Chronic ischemic heart disease, unspecified: Secondary | ICD-10-CM | POA: Diagnosis present

## 2022-06-23 DIAGNOSIS — F17210 Nicotine dependence, cigarettes, uncomplicated: Secondary | ICD-10-CM | POA: Diagnosis not present

## 2022-06-23 DIAGNOSIS — J449 Chronic obstructive pulmonary disease, unspecified: Secondary | ICD-10-CM | POA: Diagnosis present

## 2022-06-23 DIAGNOSIS — Z83438 Family history of other disorder of lipoprotein metabolism and other lipidemia: Secondary | ICD-10-CM | POA: Diagnosis not present

## 2022-06-23 DIAGNOSIS — Z95828 Presence of other vascular implants and grafts: Secondary | ICD-10-CM | POA: Diagnosis not present

## 2022-06-23 DIAGNOSIS — E1129 Type 2 diabetes mellitus with other diabetic kidney complication: Secondary | ICD-10-CM | POA: Diagnosis present

## 2022-06-23 DIAGNOSIS — G4733 Obstructive sleep apnea (adult) (pediatric): Secondary | ICD-10-CM

## 2022-06-23 DIAGNOSIS — Z8614 Personal history of Methicillin resistant Staphylococcus aureus infection: Secondary | ICD-10-CM | POA: Diagnosis not present

## 2022-06-23 DIAGNOSIS — I700 Atherosclerosis of aorta: Secondary | ICD-10-CM | POA: Diagnosis present

## 2022-06-23 DIAGNOSIS — E1122 Type 2 diabetes mellitus with diabetic chronic kidney disease: Secondary | ICD-10-CM | POA: Diagnosis not present

## 2022-06-23 DIAGNOSIS — Z881 Allergy status to other antibiotic agents status: Secondary | ICD-10-CM | POA: Diagnosis not present

## 2022-06-23 LAB — LIPID PANEL
Cholesterol: 201 mg/dL — ABNORMAL HIGH (ref 0–200)
HDL: 47 mg/dL (ref >40)
LDL Cholesterol: 137 mg/dL — ABNORMAL HIGH (ref 0–99)
Total CHOL/HDL Ratio: 4.3 RATIO
Triglycerides: 83 mg/dL (ref <150)
VLDL: 17 mg/dL (ref 0–40)

## 2022-06-23 LAB — CBC
HCT: 31.3 % — ABNORMAL LOW (ref 39.0–52.0)
Hemoglobin: 10.2 g/dL — ABNORMAL LOW (ref 13.0–17.0)
MCH: 29.5 pg (ref 26.0–34.0)
MCHC: 32.6 g/dL (ref 30.0–36.0)
MCV: 90.5 fL (ref 80.0–100.0)
Platelets: 236 10*3/uL (ref 150–400)
RBC: 3.46 MIL/uL — ABNORMAL LOW (ref 4.22–5.81)
RDW: 13.4 % (ref 11.5–15.5)
WBC: 8.5 10*3/uL (ref 4.0–10.5)
nRBC: 0 % (ref 0.0–0.2)

## 2022-06-23 LAB — BASIC METABOLIC PANEL
Anion gap: 5 (ref 5–15)
BUN: 58 mg/dL — ABNORMAL HIGH (ref 6–20)
CO2: 21 mmol/L — ABNORMAL LOW (ref 22–32)
Calcium: 8.5 mg/dL — ABNORMAL LOW (ref 8.9–10.3)
Chloride: 114 mmol/L — ABNORMAL HIGH (ref 98–111)
Creatinine, Ser: 4.37 mg/dL — ABNORMAL HIGH (ref 0.61–1.24)
GFR, Estimated: 15 mL/min — ABNORMAL LOW (ref >60)
Glucose, Bld: 96 mg/dL (ref 70–99)
Potassium: 3.9 mmol/L (ref 3.5–5.1)
Sodium: 140 mmol/L (ref 135–145)

## 2022-06-23 LAB — HIV ANTIBODY (ROUTINE TESTING W REFLEX): HIV Screen 4th Generation wRfx: NONREACTIVE

## 2022-06-23 LAB — TROPONIN I (HIGH SENSITIVITY)
Troponin I (High Sensitivity): 27 ng/L — ABNORMAL HIGH (ref <18)
Troponin I (High Sensitivity): 25 ng/L — ABNORMAL HIGH (ref <18)
Troponin I (High Sensitivity): 24 ng/L — ABNORMAL HIGH (ref <18)

## 2022-06-23 LAB — HEMOGLOBIN A1C
Hgb A1c MFr Bld: 5.3 % (ref 4.8–5.6)
Mean Plasma Glucose: 105.41 mg/dL

## 2022-06-23 MED ORDER — ALBUTEROL SULFATE (2.5 MG/3ML) 0.083% IN NEBU: 2.5000 mg | INHALATION_SOLUTION | RESPIRATORY_TRACT | Status: DC | PRN

## 2022-06-23 MED ORDER — HYDRALAZINE HCL 50 MG PO TABS
100.0000 mg | ORAL_TABLET | Freq: Two times a day (BID) | ORAL | Status: DC
Start: 2022-06-23 — End: 2022-06-24
  Administered 2022-06-23 – 2022-06-24 (×3): 100 mg via ORAL
  Filled 2022-06-23 (×3): qty 2

## 2022-06-23 MED ORDER — HYDROXYZINE HCL 25 MG PO TABS
50.0000 mg | ORAL_TABLET | Freq: Three times a day (TID) | ORAL | Status: DC | PRN
Start: 2022-06-23 — End: 2022-07-02
  Administered 2022-06-29: 50 mg via ORAL
  Filled 2022-06-23: qty 2

## 2022-06-23 MED ORDER — HYDROCOD POLI-CHLORPHE POLI ER 10-8 MG/5ML PO SUER: 5.0000 mL | Freq: Two times a day (BID) | ORAL | Status: DC | PRN

## 2022-06-23 MED ORDER — FUROSEMIDE 10 MG/ML IJ SOLN
80.0000 mg | Freq: Two times a day (BID) | INTRAMUSCULAR | Status: DC
  Administered 2022-06-23 – 2022-06-25 (×3): 80 mg via INTRAVENOUS
  Filled 2022-06-23 (×3): qty 8

## 2022-06-23 MED ORDER — ASPIRIN 81 MG PO TBEC
81.0000 mg | DELAYED_RELEASE_TABLET | Freq: Every day | ORAL | Status: DC
  Administered 2022-06-23 – 2022-07-02 (×9): 81 mg via ORAL
  Filled 2022-06-23 (×9): qty 1

## 2022-06-23 MED ORDER — HYDRALAZINE HCL 20 MG/ML IJ SOLN: 5.0000 mg | INTRAMUSCULAR | Status: DC | PRN

## 2022-06-23 MED ORDER — METHYLPREDNISOLONE SODIUM SUCC 40 MG IJ SOLR
40.0000 mg | Freq: Two times a day (BID) | INTRAMUSCULAR | Status: DC
  Administered 2022-06-23 – 2022-06-24 (×3): 40 mg via INTRAVENOUS
  Filled 2022-06-23 (×3): qty 1

## 2022-06-23 MED ORDER — TOPIRAMATE 25 MG PO TABS
50.0000 mg | ORAL_TABLET | Freq: Two times a day (BID) | ORAL | Status: DC
  Administered 2022-06-23 – 2022-07-01 (×17): 50 mg via ORAL
  Filled 2022-06-23 (×19): qty 2

## 2022-06-23 MED ORDER — FAMOTIDINE IN NACL 20-0.9 MG/50ML-% IV SOLN
20.0000 mg | Freq: Once | INTRAVENOUS | Status: AC
  Administered 2022-06-23: 20 mg via INTRAVENOUS
  Filled 2022-06-23: qty 50

## 2022-06-23 MED ORDER — FUROSEMIDE 10 MG/ML IJ SOLN
80.0000 mg | Freq: Once | INTRAMUSCULAR | Status: AC
  Administered 2022-06-23: 80 mg via INTRAVENOUS
  Filled 2022-06-23: qty 8

## 2022-06-23 MED ORDER — ACETAMINOPHEN 650 MG RE SUPP: 650.0000 mg | Freq: Four times a day (QID) | RECTAL | Status: DC | PRN

## 2022-06-23 MED ORDER — NICOTINE 21 MG/24HR TD PT24
21.0000 mg | MEDICATED_PATCH | Freq: Every day | TRANSDERMAL | Status: DC
  Administered 2022-06-23 – 2022-06-24 (×2): 21 mg via TRANSDERMAL
  Filled 2022-06-23 (×2): qty 1

## 2022-06-23 MED ORDER — FUROSEMIDE 10 MG/ML IJ SOLN
60.0000 mg | Freq: Two times a day (BID) | INTRAMUSCULAR | Status: DC
  Administered 2022-06-23: 60 mg via INTRAVENOUS
  Filled 2022-06-23: qty 8

## 2022-06-23 MED ORDER — AMLODIPINE BESYLATE 10 MG PO TABS
10.0000 mg | ORAL_TABLET | Freq: Every day | ORAL | Status: DC
  Administered 2022-06-23 – 2022-06-29 (×6): 10 mg via ORAL
  Filled 2022-06-23: qty 1
  Filled 2022-06-23: qty 2
  Filled 2022-06-23 (×4): qty 1

## 2022-06-23 MED ORDER — GUAIFENESIN ER 600 MG PO TB12
600.0000 mg | ORAL_TABLET | Freq: Two times a day (BID) | ORAL | Status: DC
  Administered 2022-06-23 – 2022-07-02 (×18): 600 mg via ORAL
  Filled 2022-06-23 (×19): qty 1

## 2022-06-23 MED ORDER — TRAZODONE HCL 100 MG PO TABS
100.0000 mg | ORAL_TABLET | Freq: Every day | ORAL | Status: DC
  Administered 2022-06-23 – 2022-07-01 (×9): 100 mg via ORAL
  Filled 2022-06-23 (×10): qty 1

## 2022-06-23 MED ORDER — NITROGLYCERIN 0.4 MG SL SUBL
0.4000 mg | SUBLINGUAL_TABLET | SUBLINGUAL | Status: DC | PRN
  Administered 2022-06-23 (×2): 0.4 mg via SUBLINGUAL
  Filled 2022-06-23: qty 1

## 2022-06-23 MED ORDER — IPRATROPIUM-ALBUTEROL 0.5-2.5 (3) MG/3ML IN SOLN
3.0000 mL | Freq: Once | RESPIRATORY_TRACT | Status: AC
  Administered 2022-06-23: 3 mL via RESPIRATORY_TRACT
  Filled 2022-06-23: qty 3

## 2022-06-23 MED ORDER — CARVEDILOL 25 MG PO TABS
25.0000 mg | ORAL_TABLET | Freq: Two times a day (BID) | ORAL | Status: DC
  Administered 2022-06-23 – 2022-07-02 (×16): 25 mg via ORAL
  Filled 2022-06-23 (×18): qty 1

## 2022-06-23 MED ORDER — ONDANSETRON HCL 4 MG PO TABS: 4.0000 mg | ORAL_TABLET | Freq: Four times a day (QID) | ORAL | Status: DC | PRN

## 2022-06-23 MED ORDER — DULOXETINE HCL 30 MG PO CPEP
60.0000 mg | ORAL_CAPSULE | Freq: Every day | ORAL | Status: DC
  Administered 2022-06-23 – 2022-07-02 (×9): 60 mg via ORAL
  Filled 2022-06-23 (×7): qty 2
  Filled 2022-06-23: qty 1
  Filled 2022-06-23: qty 2

## 2022-06-23 MED ORDER — EZETIMIBE 10 MG PO TABS
10.0000 mg | ORAL_TABLET | Freq: Every day | ORAL | Status: DC
  Administered 2022-06-23 – 2022-07-02 (×9): 10 mg via ORAL
  Filled 2022-06-23 (×9): qty 1

## 2022-06-23 MED ORDER — MAGNESIUM HYDROXIDE 400 MG/5ML PO SUSP
30.0000 mL | Freq: Every day | ORAL | Status: DC | PRN
  Administered 2022-06-24: 30 mL via ORAL
  Filled 2022-06-23: qty 30

## 2022-06-23 MED ORDER — FENTANYL CITRATE PF 50 MCG/ML IJ SOSY
25.0000 ug | PREFILLED_SYRINGE | INTRAMUSCULAR | Status: DC | PRN
  Administered 2022-06-23 – 2022-06-24 (×4): 25 ug via INTRAVENOUS
  Filled 2022-06-23 (×4): qty 1

## 2022-06-23 MED ORDER — PREGABALIN 50 MG PO CAPS
50.0000 mg | ORAL_CAPSULE | Freq: Every day | ORAL | Status: DC
  Administered 2022-06-23 – 2022-07-02 (×9): 50 mg via ORAL
  Filled 2022-06-23 (×9): qty 1

## 2022-06-23 MED ORDER — ACETAMINOPHEN 325 MG PO TABS
650.0000 mg | ORAL_TABLET | Freq: Four times a day (QID) | ORAL | Status: DC | PRN
  Administered 2022-06-25 – 2022-06-30 (×6): 650 mg via ORAL
  Filled 2022-06-23 (×6): qty 2

## 2022-06-23 MED ORDER — HYDRALAZINE HCL 20 MG/ML IJ SOLN
10.0000 mg | Freq: Once | INTRAMUSCULAR | Status: AC
  Administered 2022-06-23: 10 mg via INTRAVENOUS
  Filled 2022-06-23: qty 1

## 2022-06-23 MED ORDER — MOMETASONE FURO-FORMOTEROL FUM 200-5 MCG/ACT IN AERO
2.0000 | INHALATION_SPRAY | Freq: Two times a day (BID) | RESPIRATORY_TRACT | Status: DC
  Administered 2022-06-23 – 2022-07-02 (×18): 2 via RESPIRATORY_TRACT
  Filled 2022-06-23: qty 8.8

## 2022-06-23 MED ORDER — ONDANSETRON HCL 4 MG/2ML IJ SOLN: 4.0000 mg | Freq: Four times a day (QID) | INTRAMUSCULAR | Status: DC | PRN

## 2022-06-23 MED ORDER — TRAZODONE HCL 50 MG PO TABS
25.0000 mg | ORAL_TABLET | Freq: Every evening | ORAL | Status: DC | PRN
  Filled 2022-06-23: qty 1

## 2022-06-23 MED ORDER — PANTOPRAZOLE SODIUM 40 MG IV SOLR
40.0000 mg | Freq: Every day | INTRAVENOUS | Status: DC
  Administered 2022-06-23: 40 mg via INTRAVENOUS
  Filled 2022-06-23: qty 10

## 2022-06-23 MED ORDER — HEPARIN SODIUM (PORCINE) 5000 UNIT/ML IJ SOLN
5000.0000 [IU] | Freq: Three times a day (TID) | INTRAMUSCULAR | Status: DC
  Administered 2022-06-23 – 2022-07-02 (×28): 5000 [IU] via SUBCUTANEOUS
  Filled 2022-06-23 (×29): qty 1

## 2022-06-23 MED ORDER — PANTOPRAZOLE SODIUM 40 MG PO TBEC
40.0000 mg | DELAYED_RELEASE_TABLET | Freq: Every day | ORAL | Status: DC
  Administered 2022-06-23: 40 mg via ORAL
  Filled 2022-06-23: qty 1

## 2022-06-23 MED ORDER — FENOFIBRATE 54 MG PO TABS
54.0000 mg | ORAL_TABLET | Freq: Every day | ORAL | Status: DC
  Administered 2022-06-23 – 2022-06-24 (×2): 54 mg via ORAL
  Filled 2022-06-23 (×3): qty 1

## 2022-06-23 MED ORDER — IPRATROPIUM-ALBUTEROL 0.5-2.5 (3) MG/3ML IN SOLN
3.0000 mL | Freq: Four times a day (QID) | RESPIRATORY_TRACT | Status: DC
  Administered 2022-06-23 – 2022-06-24 (×4): 3 mL via RESPIRATORY_TRACT
  Filled 2022-06-23 (×4): qty 3

## 2022-06-23 MED ORDER — NITROGLYCERIN 0.2 MG/HR TD PT24
0.2000 mg | MEDICATED_PATCH | Freq: Once | TRANSDERMAL | Status: AC
Start: 2022-06-23 — End: 2022-06-24
  Administered 2022-06-23: 0.2 mg via TRANSDERMAL
  Filled 2022-06-23: qty 1

## 2022-06-23 MED ORDER — CALCITRIOL 0.25 MCG PO CAPS
0.2500 ug | ORAL_CAPSULE | Freq: Every day | ORAL | Status: DC
  Administered 2022-06-23 – 2022-07-02 (×9): 0.25 ug via ORAL
  Filled 2022-06-23 (×10): qty 1

## 2022-06-23 NOTE — ED Notes (Signed)
Lab called to collect labs  

## 2022-06-23 NOTE — ED Notes (Signed)
2nd RN unable to obtain labs. Phlebotomy called by this RN for assistance.

## 2022-06-23 NOTE — Assessment & Plan Note (Signed)
Troponin only mildly elevated.

## 2022-06-23 NOTE — Progress Notes (Signed)
Hospital owned CPAP set up in room. Patient and RN instructed to contact respiratory when patient is ready to go to sleep so RRT can place patient on CPAP.

## 2022-06-23 NOTE — ED Notes (Signed)
This RN attempted blood draw without success, lab called to collect blood.

## 2022-06-23 NOTE — ED Provider Notes (Signed)
[facility] Provider Note    Event Date/Time   First MD Initiated Contact with Patient 06/23/22 0120     (approximate)   History   Chief Complaint Chest Pain and Shortness of Breath   HPI  Derek Cerny. is a 56 y.o. male with past medical history of hypertension, diabetes, COPD, CHF, CKD, cocaine abuse, and cognitive disability who presents to the ED complaining of chest pain and shortness of breath.  28 of history is obtained from patient's sister, who states that he deals with chronic pain in his chest and both shoulders that is unchanged today.  She became more concerned about him when she went to check on him this evening and found him to be short of breath.  Patient admits that he has been increasingly short of breath over the past couple of days and feels like "there is fluid in my chest."  He states his legs have been more swollen but he denies any pain in his legs.  He states he has been urinating normally.  He has previously been told he will need to start dialysis soon, has had fistula placed in his left wrist.     Physical Exam   Triage Vital Signs: ED Triage Vitals  Enc Vitals Group     BP 06/22/22 2159 (!) 208/102     Pulse Rate 06/22/22 2159 77     Resp 06/22/22 2159 (!) 25     Temp 06/22/22 2159 97.8 F (36.6 C)     Temp Source 06/22/22 2159 Oral     SpO2 06/22/22 2158 100 %     Weight --      Height --      Head Circumference --      Peak Flow --      Pain Score 06/22/22 2200 8     Pain Loc --      Pain Edu? --      Excl. in [place]? --     Most recent vital signs: Vitals:   06/23/22 0128 06/23/22 0130  BP:  (!) 221/104  Pulse: 75 78  Resp: 20 (!) 23  Temp:    SpO2: 96% 94%    Constitutional: Alert and oriented. Eyes: Conjunctivae are normal. Head: Atraumatic. Nose: No congestion/rhinnorhea. Mouth/Throat: Mucous membranes are moist.  Cardiovascular: Normal rate, regular rhythm. Grossly normal heart sounds.  2+  radial pulses bilaterally. Respiratory: Mild tachypnea noted with increased respiratory effort.  No retractions. Lungs with crackles and wheezing bilaterally. Gastrointestinal: Soft and nontender. No distention. Musculoskeletal: No lower extremity tenderness nor edema.  Neurologic:  Normal speech and language. No gross focal neurologic deficits are appreciated.    ED Results / Procedures / Treatments   Labs (all labs ordered are listed, but only abnormal results are displayed) Labs Reviewed  COMPREHENSIVE METABOLIC PANEL - Abnormal; Notable for the following components:      Result Value   Glucose, Bld 144 (*)    BUN 55 (*)    Creatinine, Ser 4.37 (*)    Calcium 8.6 (*)    GFR, Estimated 15 (*)    All other components within normal limits  CBC WITH DIFFERENTIAL/PLATELET - Abnormal; Notable for the following components:   RBC 3.49 (*)    Hemoglobin 10.3 (*)    HCT 32.4 (*)    Abs Immature Granulocytes 0.08 (*)    All other components within normal limits  TROPONIN I (HIGH SENSITIVITY) - Abnormal; Notable for the following components:  Troponin I (High Sensitivity) 30 (*)    All other components within normal limits  TROPONIN I (HIGH SENSITIVITY)     EKG  ED ECG REPORT I, Blake Divine, the attending physician, personally viewed and interpreted this ECG.   Date: 06/23/2022  EKG Time: 21:57  Rate: 78  Rhythm: normal sinus rhythm  Axis: LAD  Intervals:none  ST&T Change: None  RADIOLOGY Chest x-ray reviewed and interpreted by me with cardiomegaly and pulmonary edema along with small pleural effusions.  PROCEDURES:  Critical Care performed: No  Procedures   MEDICATIONS ORDERED IN ED: Medications  furosemide (LASIX) injection 80 mg (has no administration in time range)  ipratropium-albuterol (DUONEB) 0.5-2.5 (3) MG/3ML nebulizer solution 3 mL (has no administration in time range)     IMPRESSION / MDM / ASSESSMENT AND PLAN / ED COURSE  I reviewed the triage  vital signs and the nursing notes.                              56 y.o. male with past medical history of hypertension, diabetes, COPD, CHF, CKD, cocaine abuse, and cognitive disability who presents to the ED complaining of chronic chest pain with some increasing difficulty breathing, leg swelling, and cough over the past 4 days.  Patient's presentation is most consistent with acute presentation with potential threat to life or bodily function.  Differential diagnosis includes, but is not limited to, ACS, PE, pneumonia, CHF, COPD, renal failure, electrolyte abnormality.  Patient nontoxic-appearing and in no acute distress, does appear mildly tachypneic with increased work of breathing but maintaining oxygen saturations on room air.  Lung exam with crackles and wheezing bilaterally, significant pulmonary edema noted on chest x-ray and suspect his symptoms are due to commendation of CHF and COPD.  We will diurese with IV Lasix and treat with DuoNeb.  EKG shows no evidence of arrhythmia or ischemia and troponin similar to prior baseline.  Low suspicion for ACS or PE at this time.  Renal function appears stable compared to previous with no significant electrolyte abnormality.  Patient would benefit from admission for diuresis given his tenuous renal status.  Case discussed with hospitalist for admission.      FINAL CLINICAL IMPRESSION(S) / ED DIAGNOSES   Final diagnoses:  Acute on chronic congestive heart failure, unspecified heart failure type (HCC)  Chronic chest pain  Chronic kidney disease, unspecified CKD stage     Rx / DC Orders   ED Discharge Orders     None        Note:  This document was prepared using Dragon voice recognition software and may include unintentional dictation errors.   Blake Divine, MD 06/23/22 [phone]

## 2022-06-23 NOTE — Progress Notes (Signed)
*  PRELIMINARY RESULTS* Echocardiogram 2D Echocardiogram has been performed.  Derek Cooley 06/23/2022, 6:09 PM

## 2022-06-23 NOTE — Assessment & Plan Note (Signed)
-   Continue home medications 

## 2022-06-23 NOTE — ED Notes (Signed)
MD notified pt requesting something for pain

## 2022-06-23 NOTE — Assessment & Plan Note (Addendum)
Patient is allergic to Lipitor.

## 2022-06-23 NOTE — Assessment & Plan Note (Addendum)
Third dialysis session on 06/29/2022.  Will need an outpatient dialysis slot prior to disposition.  PermCath placed 06/27/2022 by vascular surgery.

## 2022-06-23 NOTE — ED Notes (Signed)
Pt with c/o chest pain when MD in room, Dr Blaine Hamper aware. Pt eating breakfast, tolerating well.

## 2022-06-23 NOTE — Assessment & Plan Note (Addendum)
Pulse ox 81% on the day of admission.  Required oxygen during the hospital course.  Patient intermittently able to come off the oxygen that had intermittent desaturations.  Upon discharge home he was able to hold his saturations with 3 L of oxygen.  On room air he desaturated down to 82% with walking.  Borderline saturations of 88% at rest.

## 2022-06-23 NOTE — ED Notes (Signed)
Nitrostat ordered for chest pain, Dr Blaine Hamper consulted, per Dr. Blaine Hamper, do not give nitrostat sl at this time, ok to give fentanyl.

## 2022-06-23 NOTE — H&P (Addendum)
History and Physical    Derek S Swords Jr. TFT:[id number] DOB: [birth year] DOA: 06/23/2022  Referring MD/NP/PA:   PCP: Jon Billings, NP   Patient coming from:  The patient is coming from home.  At baseline, pt is independent for most of ADL.        Chief Complaint: SOB and chest pain  HPI: Derek Zaremba. is a 56 y.o. 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 Lucky Cowboy), HTN, HLD, DM, COPD, asthma, depression with anxity, anemia, tobacco abuse, polysubstance abuse including cocaine, OSA on CPAP, mild cognitive impairment, dCHF, brain aneurysm, pancreatitis, SAH, vitreous hemorrhage, who presents with shortness breath and chest pain.  Patient states that he has shortness of breath in the past several days, which has been progressively worsening.  Patient has cough with little mucus production.  No fever or chills.  Patient also complains of chest pain, which is located in substernal area, moderate to severe, pressure-like, sometimes sharp, nonradiating.  No recent long distant traveling.  Denies nausea, vomiting, diarrhea or abdominal pain.  No symptoms of UTI.  Patient is mildly drowsy during interview, but he is orientated x3.  No unilateral numbness or tingling in extremities, no facial droop or slurred speech.  Patient was found to have elevated blood pressure 227/107, which improved to 153/84, after treated with IV hydralazine and nitroglycerin patch in ED. patient normally is not using oxygen at home.  Patient was found to have severe respiratory distress, cannot speak in full sentence, using accessory muscle for breathing.  Oxygen desaturation to 85% on room air.  Currently he has 3 L new oxygen requirement.   Data reviewed independently and ED Course: pt was found to have troponin level 30 --> 27, WBC 8.5, renal function close to recent baseline, temperature normal, heart rate 78, RR 25, oxygen saturation 85% on room air, which  improved to 95% on 3 L oxygen.  Chest x-ray showed cardiomegaly and vascular congestion with interstitial edema.  CT of head is negative for acute intracranial abnormalities.  Patient is admitted to progressive bed as inpatient.  Consulted to Dr. Candiss Norse of nephrology and Dr. Garen Lah of cardiology.   EKG: I have personally reviewed.  Sinus rhythm, QTc 458, LAE, LAD, poor R wave progression.   Review of Systems:   General: no fevers, chills, no body weight gain, has fatigue HEENT: no blurry vision, hearing changes or sore throat Respiratory: has dyspnea, coughing, wheezing CV: has chest pain, no palpitations GI: no nausea, vomiting, abdominal pain, diarrhea, constipation GU: no dysuria, burning on urination, increased urinary frequency, hematuria  Ext: has leg edema Neuro: no unilateral weakness, numbness, or tingling, no vision change or hearing loss Skin: no rash, no skin tear. MSK: No muscle spasm, no deformity, no limitation of range of movement in spin Heme: No easy bruising.  Travel history: No recent long distant travel.   Allergy:  Allergies  Allergen Reactions   Atorvastatin     Joint Aches - Severe Joint Aches - Severe Joint Aches - Severe   Avelox [Moxifloxacin Hcl In Nacl]     Muscle pain   Buprenorphine     Mouth sores, confusion, shaking   Dilaudid [Hydromorphone Hcl] Hives   Fluoxetine Itching   Levofloxacin Other (See Comments)    Joint Pain   Morphine And Related    Other     Muscle pain   Suboxone [Buprenorphine Hcl-Naloxone Hcl] Other (See Comments)  Rash and confused   Vancomycin     Renal insufficiency    Past Medical History:  Diagnosis Date   Abdominal abscess    a.) chronic MRSA infection   Acute pancreatitis    Anxiety    Asthma    Brain aneurysm    a.) 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 b.) RIGHT crainotomy for clip ligation 05/14/2019.   Chronic back pain     Chronic heart failure with preserved ejection fraction (HFpEF) ([place])    a. 11/2021 Echo: EF 55-60%, no rwma, mod LVH, GrI DD. Nl RV size/fxn. Mildly dil LA.   CKD (chronic kidney disease), stage V (HCC)    Depression    Emphysema of lung (HCC)    Erectile dysfunction    Followed by palliative care service    GERD (gastroesophageal reflux disease)    History of methicillin resistant staphylococcus aureus (MRSA)    HLD (hyperlipidemia)    Hypertension    Mild cognitive impairment    MRSA (methicillin resistant Staphylococcus aureus)    Obesity    OSA (obstructive sleep apnea) 2013   a.) not compliant with nocturnal PAP therapy; CPAP machine "lost or stolen".   Panic disorder    Perforated bowel ([place]) 12/10/2005   Tempoary Colostomy Bag, Skin Graft for Abd wound   Polysubstance abuse ([place])    a.) ETOH, tobacco, marijuana, methamphetamines, cocaine, BZO, opioids.   Subarachnoid hemorrhage ([place]) 01/26/2010   a.) 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   T2DM (type 2 diabetes mellitus) ([place])    Tobacco abuse    Type 2 diabetes mellitus without complication, without long-term current use of insulin ([place]) 04/17/2016   Vitreous hemorrhage ([place]) 03/27/2011   Overview:  Bilateral; 01/2010, from [facility]     Past Surgical History:  Procedure Laterality Date   AV FISTULA PLACEMENT Left 05/24/2022   Procedure: ARTERIOVENOUS (AV) FISTULA CREATION ( RADIAL CEPHALIC);  Surgeon: Algernon Huxley, MD;  Location: [facility] ORS;  Service: Vascular;  Laterality: Left;   CEREBRAL ANEURYSM REPAIR Right 01/26/2010   Procedure: CEREBRAL ANEURYSM REPAIR (COIL EMBOLIZATION)   CEREBRAL ANEURYSM REPAIR Right 05/14/2019   Procedure: CRAINOTOMY FOR CEREBRAL ANEURYSM REPAIR (CLIP LIGATION)   COLON SURGERY  12/10/2005   colostomy bag placed s/p perforated bowel   COLONOSCOPY WITH PROPOFOL N/A 05/18/2020   Procedure: COLONOSCOPY WITH PROPOFOL;   Surgeon: Lin Landsman, MD;  Location: [facility];  Service: Gastroenterology;  Laterality: N/A;   KNEE SURGERY Right    Perforated bowel      Social History:  reports that he has been smoking cigarettes. He started smoking about 37 years ago. He has been smoking an average of .5 packs per day. He has never used smokeless tobacco. He reports current alcohol use. He reports that he does not currently use drugs after having used the following drugs: "Crack" cocaine, Amphetamines, Methamphetamines, Benzodiazepines, Cocaine, Marijuana, and Other-see comments.  Family History:  Family History  Problem Relation Age of Onset   Arthritis Mother    Asthma Mother    Diabetes Mother    Heart disease Mother    Hyperlipidemia Mother    Hypertension Mother    Kidney disease Mother    Thyroid disease Mother    Lung disease Mother    Anxiety disorder Mother    Depression Mother    Diabetes Father    Heart disease Father  Depression Father    Anxiety disorder Father    Arthritis Sister    Asthma Sister    Hyperlipidemia Sister    Hypertension Sister    Lung disease Sister    Anxiety disorder Sister    Depression Sister    Hyperlipidemia Brother    Hypertension Brother    Diabetes Sister    Heart disease Sister    Depression Sister    Anxiety disorder Sister    Anxiety disorder Brother    Depression Brother    Heart disease Brother      Prior to Admission medications   Medication Sig Start Date End Date Taking? Authorizing Provider  amLODipine (NORVASC) 10 MG tablet Take 1 tablet (10 mg total) by mouth daily. 11/28/21  Yes Fritzi Mandes, MD  budesonide-formoterol [facility]) 160-4.5 MCG/ACT inhaler Inhale 2 puffs into the lungs in the morning and at bedtime. 12/07/21  Yes Jon Billings, NP  calcitRIOL (ROCALTROL) 0.25 MCG capsule Take 0.25 mcg by mouth daily. 01/24/22  Yes [provider]  carvedilol (COREG) 25 MG tablet Take 1 tablet (25 mg total) by mouth 2 (two)  times daily with a meal. 11/28/21  Yes Fritzi Mandes, MD  diclofenac Sodium (VOLTAREN) 1 % GEL Apply 2 g topically 4 (four) times daily. 01/23/22  Yes Carrie Mew, MD  DULoxetine (CYMBALTA) 60 MG capsule Take 1 capsule (60 mg total) by mouth daily. 11/28/21  Yes Fritzi Mandes, MD  fenofibrate micronized (LOFIBRA) 67 MG capsule TAKE 1 CAPSULE EVERY DAY BEFORE BREAKFAST 11/28/21  Yes Fritzi Mandes, MD  gabapentin (NEURONTIN) 100 MG capsule Take 100 mg by mouth daily.   Yes [provider]  glucose blood (ONETOUCH ULTRA) test strip USE TO TEST BLOOD SUGAR DAILY 08/23/20  Yes Noemi Chapel A, NP  hydrALAZINE (APRESOLINE) 100 MG tablet Take 1 tablet (100 mg total) by mouth 2 (two) times daily. 05/29/22  Yes Furth, Cadence H, PA-C  hydrOXYzine (ATARAX) 50 MG tablet TAKE ONE TABLET 3 TIMES DAILY AS NEEDED 12/07/21  Yes Jon Billings, NP  pantoprazole (PROTONIX) 40 MG tablet Take 1 tablet (40 mg total) by mouth 2 (two) times daily as needed. 11/28/21  Yes Fritzi Mandes, MD  patiromer [facility]) 8.4 g packet Take 8.4 g by mouth daily.   Yes [provider]  pregabalin (LYRICA) 50 MG capsule Take 1 capsule (50 mg total) by mouth 2 (two) times daily. Patient taking differently: Take 50 mg by mouth daily. 11/28/21  Yes Fritzi Mandes, MD  topiramate (TOPAMAX) 50 MG tablet Take 1 tablet (50 mg total) by mouth 2 (two) times daily. 12/07/21  Yes Jon Billings, NP  traZODone (DESYREL) 100 MG tablet Take 100 mg by mouth at bedtime.   Yes [provider]  albuterol (VENTOLIN HFA) 108 (90 Base) MCG/ACT inhaler Inhale 1-2 puffs into the lungs every 6 (six) hours as needed for wheezing or shortness of breath. 11/28/21   Fritzi Mandes, MD  doxycycline (VIBRAMYCIN) 100 MG capsule Take 1 capsule (100 mg total) by mouth 2 (two) times daily. Patient not taking: Reported on 06/23/2022 05/31/22   Kris Hartmann, NP  HYDROcodone-acetaminophen (NORCO/VICODIN) 5-325 MG tablet Take 2 tablets by mouth  every 6 (six) hours as needed for moderate pain. Patient not taking: Reported on 06/11/2022 05/24/22 05/24/23  Algernon Huxley, MD  lidocaine (LIDODERM) 5 % Place 1 patch onto the skin every 12 (twelve) hours. Remove & Discard patch within 12 hours or as directed by MD Patient not taking: Reported on  06/23/2022 01/23/22   Carrie Mew, MD    Physical Exam: Vitals:   06/23/22 1100 06/23/22 1130 06/23/22 1200 06/23/22 1215  BP: 123/73 129/63 (!) 143/76 (!) 153/77  Pulse: 62 64 69 66  Resp: 16 17 19 15   Temp:      TempSrc:      SpO2: 94% 95% 99% 94%   General: Not in acute distress HEENT:       Eyes: PERRL, EOMI, no scleral icterus.       ENT: No discharge from the ears and nose, no pharynx injection, no tonsillar enlargement.        Neck: positive JVD, no bruit, no mass felt. Heme: No neck lymph node enlargement. Cardiac: S1/S2, RRR, No murmurs, No gallops or rubs. Respiratory: Has wheezing and crackles bilaterally GI: Soft, nondistended, nontender, no rebound pain, no organomegaly, BS present. GU: No hematuria Ext: 1+ pitting leg edema bilaterally. 1+DP/PT pulse bilaterally. Musculoskeletal: No joint deformities, No joint redness or warmth, no limitation of ROM in spin. Skin: No rashes.  Neuro: Little drowsy, easily arousable, oriented X3, cranial nerves II-XII grossly intact, moves all extremities normally.  Psych: Patient is not psychotic, no suicidal or hemocidal ideation.  Labs on Admission: I have personally reviewed following labs and imaging studies  CBC: Recent Labs  Lab 06/22/22 2209 06/23/22 0807  WBC 9.0 8.5  NEUTROABS 5.9  --   HGB 10.3* 10.2*  HCT 32.4* 31.3*  MCV 92.8 90.5  PLT 229 664   Basic Metabolic Panel: Recent Labs  Lab 06/22/22 2209 06/23/22 0807  NA 142 140  K 4.0 3.9  CL 111 114*  CO2 23 21*  GLUCOSE 144* 96  BUN 55* 58*  CREATININE 4.37* 4.37*  CALCIUM 8.6* 8.5*   GFR: CrCl cannot be calculated (Unknown ideal weight.). Liver Function  Tests: Recent Labs  Lab 06/22/22 2209  AST 18  ALT 14  ALKPHOS 75  BILITOT 0.6  PROT 6.9  ALBUMIN 3.9   No results for input(s): "LIPASE", "AMYLASE" in the last 168 hours. No results for input(s): "AMMONIA" in the last 168 hours. Coagulation Profile: No results for input(s): "INR", "PROTIME" in the last 168 hours. Cardiac Enzymes: No results for input(s): "CKTOTAL", "CKMB", "CKMBINDEX", "TROPONINI" in the last 168 hours. BNP (last 3 results) No results for input(s): "PROBNP" in the last 8760 hours. HbA1C: No results for input(s): "HGBA1C" in the last 72 hours. CBG: No results for input(s): "GLUCAP" in the last 168 hours. Lipid Profile: Recent Labs    06/23/22 0807  CHOL 201*  HDL 47  LDLCALC 137*  TRIG 83  CHOLHDL 4.3   Thyroid Function Tests: No results for input(s): "TSH", "T4TOTAL", "FREET4", "T3FREE", "THYROIDAB" in the last 72 hours. Anemia Panel: No results for input(s): "VITAMINB12", "FOLATE", "FERRITIN", "TIBC", "IRON", "RETICCTPCT" in the last 72 hours. Urine analysis:    Component Value Date/Time   COLORURINE YELLOW (A) 11/25/2021 1210   APPEARANCEUR CLEAR (A) 11/25/2021 1210   APPEARANCEUR Clear 04/17/2021 1544   LABSPEC 1.015 11/25/2021 1210   LABSPEC 1.029 06/26/2012 1231   PHURINE 6.0 11/25/2021 1210   GLUCOSEU NEGATIVE 11/25/2021 1210   GLUCOSEU Negative 06/26/2012 1231   HGBUR SMALL (A) 11/25/2021 1210   BILIRUBINUR NEGATIVE 11/25/2021 1210   BILIRUBINUR Negative 04/17/2021 1544   BILIRUBINUR Negative 06/26/2012 1231   KETONESUR NEGATIVE 11/25/2021 1210   PROTEINUR >=300 (A) 11/25/2021 1210   NITRITE NEGATIVE 11/25/2021 1210   LEUKOCYTESUR NEGATIVE 11/25/2021 1210   LEUKOCYTESUR Negative 06/26/2012 1231  Sepsis Labs: @LABRCNTIP (procalcitonin:4,lacticidven:4) )No results found for this or any previous visit (from the past 240 hour(s)).   Radiological Exams on Admission: CT HEAD WO CONTRAST (5MM)  Result Date: 06/23/2022 CLINICAL DATA:   56 year old male with hypertensive emergency. Chest pain and shortness of breath. Cocaine. EXAM: CT HEAD WITHOUT CONTRAST TECHNIQUE: Contiguous axial images were obtained from the base of the skull through the vertex without intravenous contrast. RADIATION DOSE REDUCTION: This exam was performed according to the departmental dose-optimization program which includes automated exposure control, adjustment of the mA and/or kV according to patient size and/or use of iterative reconstruction technique. COMPARISON:  Head CT 11/25/2021. 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 Chronic encephalomalacia in the nearby anterior right temporal lobe. 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 Chronic left frontal lobe white matter hypodensity which might be a previous EVD tract. Stable cerebral volume. No midline shift, ventriculomegaly, mass effect, evidence of mass lesion, intracranial hemorrhage or evidence of cortically based acute infarction. Gray-white matter differentiation is within normal limits throughout the brain. Vascular: Calcified atherosclerosis at the skull base. 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 No suspicious intracranial vascular hyperdensity. Skull: Previous right frontotemporal craniotomy is stable. Previous left vertex burr hole is stable. No acute osseous abnormality identified. Sinuses/Orbits: Bilateral maxillary sinus fluid levels. But minor paranasal sinus mucosal thickening elsewhere. Tympanic cavities and mastoids are clear. Other: Chronic scalp soft tissue scarring. No acute orbit or scalp soft tissue finding. IMPRESSION: 1. No acute intracranial abnormality. 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 Chronic right temporal lobe encephalomalacia. Chronic small vessel disease. 2. Bilateral maxillary sinus fluid levels  raising the possibility of acute sinusitis. Electronically Signed   By: Genevie Ann M.D.   On: 06/23/2022 10:26   DG Chest Portable 1 View  Result Date: 06/22/2022 CLINICAL DATA:  Chest pain with shortness of breath. EXAM: PORTABLE CHEST 1 VIEW COMPARISON:  Portable chest 01/22/2022. FINDINGS: The heart silhouette is increased in size compared to the prior study. There is increased perihilar vascular congestion and development of interstitial edema in the perihilar areas and bases with small pleural effusions also beginning to form. No focal airspace infiltrate is seen. The lungs are otherwise clear. The mediastinum is normally outlined. There is moderate bilateral shoulder DJD. IMPRESSION: Cardiomegaly with vascular congestion and interstitial edema consistent with CHF or fluid overload. Small pleural effusions are forming but no focal infiltrate is seen. Progress chest films recommended depending on clinical response. Electronically Signed   By: Telford Nab M.D.   On: 06/22/2022 22:27      Assessment/Plan Principal Problem:   Acute respiratory failure with hypoxia (HCC) Active Problems:   CKD (chronic kidney disease), stage V (HCC)   Fluid overload   COPD exacerbation (HCC)   Malignant hypertension   Chest pain   HLD (hyperlipidemia)   Substance abuse ([place])   Tobacco abuse   Type II diabetes mellitus with renal manifestations (HCC)   Depression with anxiety   Anemia in CKD (chronic kidney disease)   Assessment and Plan: * Acute respiratory failure with hypoxia (HCC) Acute respiratory failure with hypoxia due to fluid overload and COPD exacerbation.  Patient has leg edema, rest of potassium by chest x-ray, consistent with fluid overload.  This is secondary to hypertensive urgency, history of CHF and CKD-V.  Patient also has wheezing on auscultation, indicating COPD exacerbation.  Patient is 3 L new  oxygen requirement.  -Admitted to progressive bed as inpatient -Bronchodilators -IV  Lasix -Nasal cannula oxygen to maintain oxygen saturation above 93% -Blood pressure control  Fluid overload - IV Lasix 80 mg twice daily -Consulted Dr. Candiss Norse of nephrology  CKD (chronic kidney disease), stage V ([place]) His renal function seems to be close to baseline.  Recent baseline creatinine 4.53.  His creatinine is at 4.37, BUN 58. Patient had AV fistula placement on 6/15 by Dr. Lucky Cowboy. -Consulted Dr. Candiss Norse of nephrology  COPD exacerbation [facility]) - Bronchodilators, -Solu-Medrol 40 mg twice daily  Malignant hypertension Malignant hypertension hypertensive emergency: Blood pressure 227/107, improved to 150 after treated with hydralazine IV, nitro drip glycerin patch 0.2 mg  -IV hydralazine as needed -Continue home amlodipine, Coreg, oral hydralazine  Chest pain Chest pain and myocardial injury due to multifactorial etiology, including hypertensive urgency, fluid overload, CHF exacerbation: Troponin level 30, 27.    - Trend Trop - prn Nitroglycerin and Fentanyl - Aspirin, fenofibrate - Risk factor stratification: will check FLP and A1C  - check UDS - Consulted Dr. Garen Lah of card     HLD (hyperlipidemia) Patient is allergic to Lipitor -on Fenofibrate  Substance abuse ([place]) - Check UDS  Tobacco abuse - Nicotine patch  Type II diabetes mellitus with renal manifestations (HCC) Diet-controlled diabetes.  Patient is not taking medications.  Recent A1c 5.4, well controlled. -Check blood sugar every morning  Depression with anxiety - Continue home medications  Anemia in CKD (chronic kidney disease) Hemoglobin 10.2 (11.9 on 05/21/2022), slightly dropped, no active bleeding. -Follow-up with CBC          DVT ppx: SQ Heparin   Code Status: Full code (The sister mentioned that the patient wanted to be DNR before, but I discussed CODE STATUS with the patient directly, I explained the meaning to the patient about CODE STATUS, patient clearly told me that he wants  to be full code)  Family Communication: Yes, patient's sister   by phone  Disposition Plan:  Anticipate discharge back to previous environment  Consults called:  Consulted to Dr. Candiss Norse of nephrology and Dr. Garen Lah of cardiology  Admission status and Level of care: Progressive:     as inpt         Severity of Illness:  The appropriate patient status for this patient is INPATIENT. Inpatient status is judged to be reasonable and necessary in order to provide the required intensity of service to ensure the patient's safety. The patient's presenting symptoms, physical exam findings, and initial radiographic and laboratory data in the context of their chronic comorbidities is felt to place them at high risk for further clinical deterioration. Furthermore, it is not anticipated that the patient will be medically stable for discharge from the hospital within 2 midnights of admission.   * I certify that at the point of admission it is my clinical judgment that the patient will require inpatient hospital care spanning beyond 2 midnights from the point of admission due to high intensity of service, high risk for further deterioration and high frequency of surveillance required.*       Date of Service 06/23/2022    Ivor Costa [facility]   If 7PM-7AM, please contact night-coverage [email or web address] 06/23/2022, 12:49 PM

## 2022-06-23 NOTE — Progress Notes (Signed)
[place], [place] 06/23/22  Subjective:   Hospital day # 0 Patient known to our practice from outpatient follow-up of CHF.  Presents today for increasing shortness of breath, chest pain, worsening lower extremity edema.  This morning, reported midepigastric tightness and burning chest pain, which is nonexertional.  Patient does not correlate with diet.  Nonreproducible. Nephrology consult requested for management of advanced CKD. Patient has a developing left arm AV fistula. So far he has received IV furosemide 80 mg with good response.  Urine output reported at 1500 cc so far.   Renal: 07/14 0701 - 07/15 0700 In: -  Out: 900 [Urine:900] Lab Results  Component Value Date   CREATININE 4.37 (H) 06/23/2022   CREATININE 4.37 (H) 06/22/2022   CREATININE 4.53 (H) 05/21/2022     Objective:  Vital signs in last 24 hours:  Temp:  [97.8 F (36.6 C)] 97.8 F (36.6 C) (07/14 2159) Pulse Rate:  [68-78] 68 (07/15 1023) Resp:  [13-25] 16 (07/15 1023) BP: (153-229)/(84-107) 153/84 (07/15 1023) SpO2:  [81 %-100 %] 95 % (07/15 1024)  Weight change:  There were no vitals filed for this visit.  Intake/Output:    Intake/Output Summary (Last 24 hours) at 06/23/2022 1133 Last data filed at 06/23/2022 0836 Gross per 24 hour  Intake --  Output 1500 ml  Net -1500 ml     Physical Exam: General: Mild distress, laying in the bed  HEENT Anicteric, moist oral mucous membranes  Pulm/lungs Mild diffuse basilar crackles  CVS/Heart No rub, soft systolic murmur  Abdomen:  Obese, soft, nontender  Extremities: 1+ pitting edema bilaterally  Neurologic: Alert, oriented.  Able to follow commands.  Skin: Warm, dry.  No acute rashes.  Access: Developing left arm AV fistula.  Good thrill.       Basic Metabolic Panel:  Recent Labs  Lab 06/22/22 2209 06/23/22 0807  NA 142 140  K 4.0 3.9  CL 111 114*  CO2 23 21*  GLUCOSE 144* 96  BUN 55* 58*  CREATININE 4.37*  4.37*  CALCIUM 8.6* 8.5*     CBC: Recent Labs  Lab 06/22/22 2209 06/23/22 0807  WBC 9.0 8.5  NEUTROABS 5.9  --   HGB 10.3* 10.2*  HCT 32.4* 31.3*  MCV 92.8 90.5  PLT 229 236     No results found for: "HEPBSAG", "HEPBSAB", "HEPBIGM"    Microbiology:  No results found for this or any previous visit (from the past 240 hour(s)).  Coagulation Studies: No results for input(s): "LABPROT", "INR" in the last 72 hours.  Urinalysis: No results for input(s): "COLORURINE", "LABSPEC", "PHURINE", "GLUCOSEU", "HGBUR", "BILIRUBINUR", "KETONESUR", "PROTEINUR", "UROBILINOGEN", "NITRITE", "LEUKOCYTESUR" in the last 72 hours.  Invalid input(s): "APPERANCEUR"    Imaging: CT HEAD WO CONTRAST (5MM)  Result Date: 06/23/2022 CLINICAL DATA:  56 year old male with hypertensive emergency. Chest pain and shortness of breath. Cocaine. EXAM: CT HEAD WITHOUT CONTRAST TECHNIQUE: Contiguous axial images were obtained from the base of the skull through the vertex without intravenous contrast. RADIATION DOSE REDUCTION: This exam was performed according to the departmental dose-optimization program which includes automated exposure control, adjustment of the mA and/or kV according to patient size and/or use of iterative reconstruction technique. COMPARISON:  Head CT 11/25/2021. 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 Chronic encephalomalacia in the nearby anterior right temporal lobe. 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 Chronic left frontal lobe white matter hypodensity which might be a previous EVD tract. Stable cerebral  volume. No midline shift, ventriculomegaly, mass effect, evidence of mass lesion, intracranial hemorrhage or evidence of cortically based acute infarction. Gray-white matter differentiation is within normal limits throughout the brain. Vascular: Calcified atherosclerosis at the skull base. 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 No suspicious intracranial vascular hyperdensity. Skull: Previous right frontotemporal craniotomy is stable. Previous left vertex burr hole is stable. No acute osseous abnormality identified. Sinuses/Orbits: Bilateral maxillary sinus fluid levels. But minor paranasal sinus mucosal thickening elsewhere. Tympanic cavities and mastoids are clear. Other: Chronic scalp soft tissue scarring. No acute orbit or scalp soft tissue finding. IMPRESSION: 1. No acute intracranial abnormality. 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 Chronic right temporal lobe encephalomalacia. Chronic small vessel disease. 2. Bilateral maxillary sinus fluid levels raising the possibility of acute sinusitis. Electronically Signed   By: Genevie Ann M.D.   On: 06/23/2022 10:26   DG Chest Portable 1 View  Result Date: 06/22/2022 CLINICAL DATA:  Chest pain with shortness of breath. EXAM: PORTABLE CHEST 1 VIEW COMPARISON:  Portable chest 01/22/2022. FINDINGS: The heart silhouette is increased in size compared to the prior study. There is increased perihilar vascular congestion and development of interstitial edema in the perihilar areas and bases with small pleural effusions also beginning to form. No focal airspace infiltrate is seen. The lungs are otherwise clear. The mediastinum is normally outlined. There is moderate bilateral shoulder DJD. IMPRESSION: Cardiomegaly with vascular congestion and interstitial edema consistent with CHF or fluid overload. Small pleural effusions are forming but no focal infiltrate is seen. Progress chest films recommended depending on clinical response. Electronically Signed   By: Telford Nab M.D.   On: 06/22/2022 22:27     Medications:     amLODipine  10 mg Oral Daily   aspirin EC  81 mg Oral Daily   calcitRIOL  0.25 mcg Oral Daily   carvedilol  25 mg Oral BID WC   DULoxetine  60 mg  Oral Daily   fenofibrate  54 mg Oral Daily   furosemide  80 mg Intravenous Q12H   guaiFENesin  600 mg Oral BID   heparin  5,000 Units Subcutaneous Q8H   hydrALAZINE  100 mg Oral BID   ipratropium-albuterol  3 mL Nebulization QID   methylPREDNISolone (SOLU-MEDROL) injection  40 mg Intravenous BID   mometasone-formoterol  2 puff Inhalation BID   nicotine  21 mg Transdermal Daily   nitroGLYCERIN  0.2 mg Transdermal Once   pantoprazole  40 mg Oral Daily   pregabalin  50 mg Oral Daily   topiramate  50 mg Oral BID   traZODone  100 mg Oral QHS   acetaminophen **OR** acetaminophen, albuterol, fentaNYL (SUBLIMAZE) injection, hydrALAZINE, hydrOXYzine, magnesium hydroxide, nitroGLYCERIN, ondansetron **OR** ondansetron (ZOFRAN) IV, traZODone  Assessment/ Plan:  56 y.o. male with type 2 diabetes, advanced CKD from diabetic nephropathy, history of polysubstance abuse  admitted on 06/23/2022 for Fluid overload [E87.70]  #Chronic kidney disease stage V in the setting of diabetic nephropathy. Outpatient creatinine of 4.36/GFR  14-15.  Today's creatinine is 4.37, GFR 15.  At baseline.  No overt uremic symptoms.  No acute indication for dialysis at present.  Will evaluate daily.  #Volume overload/malignant hypertension/lower extremity edema Presenting blood pressure of 227/107. Responding to IV furosemide Most recent blood pressure 123/73, at 11 AM.  Current regimen includes amlodipine, carvedilol, IV furosemide, hydralazine.  Cardiology evaluation requested for chest pain.  #Anemia of chronic kidney  disease  Lab Results  Component Value Date   HGB 10.2 (L) 06/23/2022  Hemoglobin level acceptable.  No indication for ESA.  Continue to monitor closely.     LOS: 0 Jacquees Gongora Candiss Norse 7/15/202311:33 AM  [place], [place]  Note: This note was prepared with Dragon dictation. Any transcription errors are unintentional

## 2022-06-23 NOTE — Assessment & Plan Note (Addendum)
On admission, continue home losartan, Coreg, oral hydralazine

## 2022-06-23 NOTE — Assessment & Plan Note (Addendum)
Diet-controlled diabetes.  Recent A1c 5.3, well controlled.

## 2022-06-23 NOTE — Assessment & Plan Note (Addendum)
Last hemoglobin 9.9 

## 2022-06-23 NOTE — Assessment & Plan Note (Deleted)
Check UDS. 

## 2022-06-23 NOTE — ED Notes (Signed)
Unsuccessful attempt at drawing labs. Will have 2nd RN attempt/look.

## 2022-06-23 NOTE — Assessment & Plan Note (Deleted)
-   IV Lasix 80 mg twice daily -Consulted Dr. Candiss Norse of nephrology

## 2022-06-23 NOTE — Consult Note (Signed)
Cardiology Consultation:   Patient ID: Derek Cooley. MRN: [id number]; DOB: 17-Oct-1966  Admit date: 06/23/2022 Date of Consult: 06/23/2022  PCP:  Jon Billings, NP   [facility] Providers Cardiologist:  Kathlyn Sacramento, MD        Patient Profile:   Derek Cooley. is a 56 y.o. male with a hx of hypertension, diabetes, COPD who is being seen 06/23/2022 for the evaluation of chest pain, edema at the request of Dr. Blaine Hamper.  History of Present Illness:   Mr. Detweiler is a 56 year old male with history of hypertension, diabetes, current smoker, COPD, CKD 5, cocaine use, subarachnoid hemorrhage s/p craniotomy and clipping 2020 who presents with worsening shortness of breath and lower extremity edema.  He states symptoms worsened over a 3-week period.  Denies taking any diuretics at home.  States last use of cocaine was over 1 to 2 months ago.  Due to progressively worsening symptoms, presented to the ED.  He also endorsed chest pain not associated with exertion, states eating sometimes make the pain worse.  Seen last month in the office, stress test was being considered due to atypical chest pain.  Not tolerant of Lipitor.  In the ED upon admission, blood pressures were elevated with systolic of 409.  Chest x-ray showed vascular congestion and interstitial edema.  EKG showed sinus rhythm with possible old septal infarct.  Troponins were 30, 27, 25.  Patient started on IV Lasix, home BP medications restarted, blood pressure improved to systolic of 811 upon my exam.   Past Medical History:  Diagnosis Date   Abdominal abscess    a.) chronic MRSA infection   Acute pancreatitis    Anxiety    Asthma    Brain aneurysm    a.) 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 b.) RIGHT crainotomy for clip ligation 05/14/2019.   Chronic back pain    Chronic heart failure with preserved ejection fraction (HFpEF) ([place])    a.  11/2021 Echo: EF 55-60%, no rwma, mod LVH, GrI DD. Nl RV size/fxn. Mildly dil LA.   CKD (chronic kidney disease), stage V (HCC)    Depression    Emphysema of lung (HCC)    Erectile dysfunction    Followed by palliative care service    GERD (gastroesophageal reflux disease)    History of methicillin resistant staphylococcus aureus (MRSA)    HLD (hyperlipidemia)    Hypertension    Mild cognitive impairment    MRSA (methicillin resistant Staphylococcus aureus)    Obesity    OSA (obstructive sleep apnea) 2013   a.) not compliant with nocturnal PAP therapy; CPAP machine "lost or stolen".   Panic disorder    Perforated bowel ([place]) 12/10/2005   Tempoary Colostomy Bag, Skin Graft for Abd wound   Polysubstance abuse ([place])    a.) ETOH, tobacco, marijuana, methamphetamines, cocaine, BZO, opioids.   Subarachnoid hemorrhage ([place]) 01/26/2010   a.) 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   T2DM (type 2 diabetes mellitus) ([place])    Tobacco abuse    Type 2 diabetes mellitus without complication, without long-term current use of insulin ([place]) 04/17/2016   Vitreous hemorrhage ([place]) 03/27/2011   Overview:  Bilateral; 01/2010, from [facility]     Past Surgical History:  Procedure Laterality Date   AV FISTULA PLACEMENT Left 05/24/2022   Procedure: ARTERIOVENOUS (AV) FISTULA CREATION ( RADIAL CEPHALIC);  Surgeon: Lucky Cowboy,  Erskine Squibb, MD;  Location: [facility] ORS;  Service: Vascular;  Laterality: Left;   CEREBRAL ANEURYSM REPAIR Right 01/26/2010   Procedure: CEREBRAL ANEURYSM REPAIR (COIL EMBOLIZATION)   CEREBRAL ANEURYSM REPAIR Right 05/14/2019   Procedure: CRAINOTOMY FOR CEREBRAL ANEURYSM REPAIR (CLIP LIGATION)   COLON SURGERY  12/10/2005   colostomy bag placed s/p perforated bowel   COLONOSCOPY WITH PROPOFOL N/A 05/18/2020   Procedure: COLONOSCOPY WITH PROPOFOL;  Surgeon: Lin Landsman, MD;  Location: [place];  Service:  Gastroenterology;  Laterality: N/A;   KNEE SURGERY Right    Perforated bowel       Home Medications:  Prior to Admission medications   Medication Sig Start Date End Date Taking? Authorizing Provider  amLODipine (NORVASC) 10 MG tablet Take 1 tablet (10 mg total) by mouth daily. 11/28/21  Yes Fritzi Mandes, MD  budesonide-formoterol [facility]) 160-4.5 MCG/ACT inhaler Inhale 2 puffs into the lungs in the morning and at bedtime. 12/07/21  Yes Jon Billings, NP  calcitRIOL (ROCALTROL) 0.25 MCG capsule Take 0.25 mcg by mouth daily. 01/24/22  Yes [provider]  carvedilol (COREG) 25 MG tablet Take 1 tablet (25 mg total) by mouth 2 (two) times daily with a meal. 11/28/21  Yes Fritzi Mandes, MD  diclofenac Sodium (VOLTAREN) 1 % GEL Apply 2 g topically 4 (four) times daily. 01/23/22  Yes Carrie Mew, MD  DULoxetine (CYMBALTA) 60 MG capsule Take 1 capsule (60 mg total) by mouth daily. 11/28/21  Yes Fritzi Mandes, MD  fenofibrate micronized (LOFIBRA) 67 MG capsule TAKE 1 CAPSULE EVERY DAY BEFORE BREAKFAST 11/28/21  Yes Fritzi Mandes, MD  gabapentin (NEURONTIN) 100 MG capsule Take 100 mg by mouth daily.   Yes [provider]  glucose blood (ONETOUCH ULTRA) test strip USE TO TEST BLOOD SUGAR DAILY 08/23/20  Yes Noemi Chapel A, NP  hydrALAZINE (APRESOLINE) 100 MG tablet Take 1 tablet (100 mg total) by mouth 2 (two) times daily. 05/29/22  Yes Furth, Cadence H, PA-C  hydrOXYzine (ATARAX) 50 MG tablet TAKE ONE TABLET 3 TIMES DAILY AS NEEDED 12/07/21  Yes Jon Billings, NP  pantoprazole (PROTONIX) 40 MG tablet Take 1 tablet (40 mg total) by mouth 2 (two) times daily as needed. 11/28/21  Yes Fritzi Mandes, MD  patiromer [facility]) 8.4 g packet Take 8.4 g by mouth daily.   Yes [provider]  pregabalin (LYRICA) 50 MG capsule Take 1 capsule (50 mg total) by mouth 2 (two) times daily. Patient taking differently: Take 50 mg by mouth daily. 11/28/21  Yes Fritzi Mandes, MD  topiramate  (TOPAMAX) 50 MG tablet Take 1 tablet (50 mg total) by mouth 2 (two) times daily. 12/07/21  Yes Jon Billings, NP  traZODone (DESYREL) 100 MG tablet Take 100 mg by mouth at bedtime.   Yes [provider]  albuterol (VENTOLIN HFA) 108 (90 Base) MCG/ACT inhaler Inhale 1-2 puffs into the lungs every 6 (six) hours as needed for wheezing or shortness of breath. 11/28/21   Fritzi Mandes, MD  doxycycline (VIBRAMYCIN) 100 MG capsule Take 1 capsule (100 mg total) by mouth 2 (two) times daily. Patient not taking: Reported on 06/23/2022 05/31/22   Kris Hartmann, NP  HYDROcodone-acetaminophen (NORCO/VICODIN) 5-325 MG tablet Take 2 tablets by mouth every 6 (six) hours as needed for moderate pain. Patient not taking: Reported on 06/11/2022 05/24/22 05/24/23  Algernon Huxley, MD  lidocaine (LIDODERM) 5 % Place 1 patch onto the skin every 12 (twelve) hours. Remove & Discard patch within 12 hours or as  directed by MD Patient not taking: Reported on 06/23/2022 01/23/22   Carrie Mew, MD    Inpatient Medications: Scheduled Meds:  amLODipine  10 mg Oral Daily   aspirin EC  81 mg Oral Daily   calcitRIOL  0.25 mcg Oral Daily   carvedilol  25 mg Oral BID WC   DULoxetine  60 mg Oral Daily   ezetimibe  10 mg Oral Daily   fenofibrate  54 mg Oral Daily   furosemide  80 mg Intravenous Q12H   guaiFENesin  600 mg Oral BID   heparin  5,000 Units Subcutaneous Q8H   hydrALAZINE  100 mg Oral BID   ipratropium-albuterol  3 mL Nebulization QID   methylPREDNISolone (SOLU-MEDROL) injection  40 mg Intravenous BID   mometasone-formoterol  2 puff Inhalation BID   nicotine  21 mg Transdermal Daily   nitroGLYCERIN  0.2 mg Transdermal Once   pantoprazole  40 mg Oral Daily   pregabalin  50 mg Oral Daily   topiramate  50 mg Oral BID   traZODone  100 mg Oral QHS   Continuous Infusions:  famotidine (PEPCID) IV     PRN Meds: acetaminophen **OR** acetaminophen, albuterol, fentaNYL (SUBLIMAZE) injection, hydrALAZINE,  hydrOXYzine, magnesium hydroxide, nitroGLYCERIN, ondansetron **OR** ondansetron (ZOFRAN) IV, traZODone  Allergies:    Allergies  Allergen Reactions   Atorvastatin     Joint Aches - Severe Joint Aches - Severe Joint Aches - Severe   Avelox [Moxifloxacin Hcl In Nacl]     Muscle pain   Buprenorphine     Mouth sores, confusion, shaking   Dilaudid [Hydromorphone Hcl] Hives   Fluoxetine Itching   Levofloxacin Other (See Comments)    Joint Pain   Morphine And Related    Other     Muscle pain   Suboxone [Buprenorphine Hcl-Naloxone Hcl] Other (See Comments)    Rash and confused   Vancomycin     Renal insufficiency    Social History:   Social History   Socioeconomic History   Marital status: Divorced    Spouse name: Not on file   Number of children: Not on file   Years of education: Not on file   Highest education level: Not on file  Occupational History   Not on file  Tobacco Use   Smoking status: Every Day    Packs/day: 0.50    Types: Cigarettes    Start date: 10/11/1984   Smokeless tobacco: Never  Vaping Use   Vaping Use: Never used  Substance and Sexual Activity   Alcohol use: Yes   Drug use: Not Currently    Types: "Crack" cocaine, Amphetamines, Methamphetamines, Benzodiazepines, Cocaine, Marijuana, Other-see comments    Comment: opioids   Sexual activity: Not Currently  Other Topics Concern   Not on file  Social History Narrative   Lives at home with sister.   Social Determinants of Health   Financial Resource Strain: Medium Risk (06/11/2022)   Overall Financial Resource Strain (CARDIA)    Difficulty of Paying Living Expenses: Somewhat hard  Food Insecurity: No Food Insecurity (06/11/2022)   Hunger Vital Sign    Worried About Running Out of Food in the Last Year: Never true    Ran Out of Food in the Last Year: Never true  Transportation Needs: Unmet Transportation Needs (06/11/2022)   PRAPARE - Hydrologist (Medical): Yes    Lack of  Transportation (Non-Medical): No  Physical Activity: Inactive (06/11/2022)   Exercise Vital Sign  Days of Exercise per Week: 0 days    Minutes of Exercise per Session: 0 min  Stress: No Stress Concern Present (06/11/2022)   [place]    Feeling of Stress : Not at all  Social Connections: Socially Isolated (06/11/2022)   Social Connection and Isolation Panel [NHANES]    Frequency of Communication with Friends and Family: More than three times a week    Frequency of Social Gatherings with Friends and Family: More than three times a week    Attends Religious Services: Never    Marine scientist or Organizations: No    Attends Archivist Meetings: Never    Marital Status: Divorced  Human resources officer Violence: Not At Risk (06/11/2022)   Humiliation, Afraid, Rape, and Kick questionnaire    Fear of Current or Ex-Partner: No    Emotionally Abused: No    Physically Abused: No    Sexually Abused: No    Family History:    Family History  Problem Relation Age of Onset   Arthritis Mother    Asthma Mother    Diabetes Mother    Heart disease Mother    Hyperlipidemia Mother    Hypertension Mother    Kidney disease Mother    Thyroid disease Mother    Lung disease Mother    Anxiety disorder Mother    Depression Mother    Diabetes Father    Heart disease Father    Depression Father    Anxiety disorder Father    Arthritis Sister    Asthma Sister    Hyperlipidemia Sister    Hypertension Sister    Lung disease Sister    Anxiety disorder Sister    Depression Sister    Hyperlipidemia Brother    Hypertension Brother    Diabetes Sister    Heart disease Sister    Depression Sister    Anxiety disorder Sister    Anxiety disorder Brother    Depression Brother    Heart disease Brother      ROS:  Please see the history of present illness.   All other ROS reviewed and negative.     Physical Exam/Data:    Vitals:   06/23/22 1100 06/23/22 1130 06/23/22 1200 06/23/22 1215  BP: 123/73 129/63 (!) 143/76 (!) 153/77  Pulse: 62 64 69 66  Resp: 16 17 19 15   Temp:      TempSrc:      SpO2: 94% 95% 99% 94%    Intake/Output Summary (Last 24 hours) at 06/23/2022 1220 Last data filed at 06/23/2022 0836 Gross per 24 hour  Intake --  Output 1500 ml  Net -1500 ml      05/29/2022    1:17 PM 05/24/2022   10:57 AM 05/15/2022    2:44 PM  Last 3 Weights  Weight (lbs) 218 lb 6.4 oz 212 lb 218 lb 9.6 oz  Weight (kg) 99.066 kg 96.163 kg 99.156 kg     There is no height or weight on file to calculate BMI.  General:  Well nourished, well developed, mild distress HEENT: normal Neck: no JVD Vascular: No carotid bruits; Distal pulses 2+ bilaterally Cardiac:  normal S1, S2; RRR; no murmur  Lungs: Expiratory wheezing noted bilaterally, rhonchi noted Abd: soft, nontender, no hepatomegaly  Ext: 2+ pitting edema Musculoskeletal:  No deformities, BUE and BLE strength normal and equal Skin: warm and dry  Neuro:  CNs 2-12 intact, no focal abnormalities noted Psych:  Normal affect   EKG:  The EKG was personally reviewed and demonstrates: Sinus rhythm Telemetry:  Telemetry was personally reviewed and demonstrates: Sinus rhythm  Relevant CV Studies: TTE 11/2021  1. Left ventricular ejection fraction, by estimation, is 55 to 60%. The  left ventricle has normal function. The left ventricle has no regional  wall motion abnormalities. There is moderate left ventricular hypertrophy.  Left ventricular diastolic  parameters are consistent with Grade I diastolic dysfunction (impaired  relaxation).   2. Right ventricular systolic function is normal. The right ventricular  size is normal. Tricuspid regurgitation signal is inadequate for assessing  PA pressure.   3. Left atrial size was mildly dilated.   4. The mitral valve is normal in structure. No evidence of mitral valve  regurgitation. No evidence of mitral  stenosis.   5. The aortic valve is normal in structure. Aortic valve regurgitation is  not visualized. Aortic valve sclerosis is present, with no evidence of  aortic valve stenosis.   Laboratory Data:  High Sensitivity Troponin:   Recent Labs  Lab 06/22/22 2209 06/23/22 0156 06/23/22 0807  TROPONINIHS 30* 27* 25*     Chemistry Recent Labs  Lab 06/22/22 2209 06/23/22 0807  NA 142 140  K 4.0 3.9  CL 111 114*  CO2 23 21*  GLUCOSE 144* 96  BUN 55* 58*  CREATININE 4.37* 4.37*  CALCIUM 8.6* 8.5*  GFRNONAA 15* 15*  ANIONGAP 8 5    Recent Labs  Lab 06/22/22 2209  PROT 6.9  ALBUMIN 3.9  AST 18  ALT 14  ALKPHOS 75  BILITOT 0.6   Lipids  Recent Labs  Lab 06/23/22 0807  CHOL 201*  TRIG 83  HDL 47  LDLCALC 137*  CHOLHDL 4.3    Hematology Recent Labs  Lab 06/22/22 2209 06/23/22 0807  WBC 9.0 8.5  RBC 3.49* 3.46*  HGB 10.3* 10.2*  HCT 32.4* 31.3*  MCV 92.8 90.5  MCH 29.5 29.5  MCHC 31.8 32.6  RDW 13.3 13.4  PLT 229 236   Thyroid No results for input(s): "TSH", "FREET4" in the last 168 hours.  BNPNo results for input(s): "BNP", "PROBNP" in the last 168 hours.  DDimer No results for input(s): "DDIMER" in the last 168 hours.   Radiology/Studies:  CT HEAD WO CONTRAST (5MM)  Result Date: 06/23/2022 CLINICAL DATA:  56 year old male with hypertensive emergency. Chest pain and shortness of breath. Cocaine. EXAM: CT HEAD WITHOUT CONTRAST TECHNIQUE: Contiguous axial images were obtained from the base of the skull through the vertex without intravenous contrast. RADIATION DOSE REDUCTION: This exam was performed according to the departmental dose-optimization program which includes automated exposure control, adjustment of the mA and/or kV according to patient size and/or use of iterative reconstruction technique. COMPARISON:  Head CT 11/25/2021. 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 Chronic encephalomalacia in the nearby  anterior right temporal lobe. 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 Chronic left frontal lobe white matter hypodensity which might be a previous EVD tract. Stable cerebral volume. No midline shift, ventriculomegaly, mass effect, evidence of mass lesion, intracranial hemorrhage or evidence of cortically based acute infarction. Gray-white matter differentiation is within normal limits throughout the brain. Vascular: Calcified atherosclerosis at the skull base. 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 No suspicious intracranial vascular hyperdensity. Skull: Previous right frontotemporal craniotomy is stable. Previous left vertex burr hole is stable. No acute osseous abnormality identified. Sinuses/Orbits: Bilateral maxillary  sinus fluid levels. But minor paranasal sinus mucosal thickening elsewhere. Tympanic cavities and mastoids are clear. Other: Chronic scalp soft tissue scarring. No acute orbit or scalp soft tissue finding. IMPRESSION: 1. No acute intracranial abnormality. 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 Chronic right temporal lobe encephalomalacia. Chronic small vessel disease. 2. Bilateral maxillary sinus fluid levels raising the possibility of acute sinusitis. Electronically Signed   By: Genevie Ann M.D.   On: 06/23/2022 10:26   DG Chest Portable 1 View  Result Date: 06/22/2022 CLINICAL DATA:  Chest pain with shortness of breath. EXAM: PORTABLE CHEST 1 VIEW COMPARISON:  Portable chest 01/22/2022. FINDINGS: The heart silhouette is increased in size compared to the prior study. There is increased perihilar vascular congestion and development of interstitial edema in the perihilar areas and bases with small pleural effusions also beginning to form. No focal airspace infiltrate is seen. The lungs are otherwise clear. The mediastinum is normally outlined. There is  moderate bilateral shoulder DJD. IMPRESSION: Cardiomegaly with vascular congestion and interstitial edema consistent with CHF or fluid overload. Small pleural effusions are forming but no focal infiltrate is seen. Progress chest films recommended depending on clinical response. Electronically Signed   By: Telford Nab M.D.   On: 06/22/2022 22:27     Assessment and Plan:   HFpEF -Appears volume overloaded, edema on chest x-ray, 2+ edema lower extremities -Net -1.5 L so far on IV Lasix -Continue Lasix IV 80 mg twice daily -Repeat echocardiogram.  2.  Hypertension -BP improved on current regimen -Continue Coreg, hydralazine, amlodipine, Lasix  3.  Chest pain, atypical -Troponin elevations represent demand supply mismatch -Symptoms worsened with food, start Protonix -Obtain echocardiogram as above -Plan Lexiscan Myoview on Monday if patient still in house  4.  COPD, wheezing -Possible exacerbation management as per medicine team  5.  CKD 5 -Nephrology following -Management as per nephrology  6.  Current smoker -On nicotine patch -Cessation recommended  Total encounter time more than 85 minutes  Greater than 50% was spent in counseling and coordination of care with the patient   Signed, Kate Sable, MD  06/23/2022 12:20 PM

## 2022-06-23 NOTE — ED Notes (Signed)
Patient transported to CT 

## 2022-06-23 NOTE — Assessment & Plan Note (Addendum)
Continue nicotine patch  °

## 2022-06-23 NOTE — Assessment & Plan Note (Signed)
Completed treatment

## 2022-06-24 DIAGNOSIS — J9601 Acute respiratory failure with hypoxia: Secondary | ICD-10-CM | POA: Diagnosis not present

## 2022-06-24 DIAGNOSIS — I5033 Acute on chronic diastolic (congestive) heart failure: Secondary | ICD-10-CM

## 2022-06-24 DIAGNOSIS — I10 Essential (primary) hypertension: Secondary | ICD-10-CM | POA: Diagnosis not present

## 2022-06-24 DIAGNOSIS — N185 Chronic kidney disease, stage 5: Secondary | ICD-10-CM | POA: Diagnosis not present

## 2022-06-24 DIAGNOSIS — R072 Precordial pain: Secondary | ICD-10-CM | POA: Diagnosis not present

## 2022-06-24 DIAGNOSIS — Z720 Tobacco use: Secondary | ICD-10-CM | POA: Diagnosis not present

## 2022-06-24 LAB — CBC
HCT: 31.3 % — ABNORMAL LOW (ref 39.0–52.0)
Hemoglobin: 10.3 g/dL — ABNORMAL LOW (ref 13.0–17.0)
MCH: 29.3 pg (ref 26.0–34.0)
MCHC: 32.9 g/dL (ref 30.0–36.0)
MCV: 89.2 fL (ref 80.0–100.0)
Platelets: 222 10*3/uL (ref 150–400)
RBC: 3.51 MIL/uL — ABNORMAL LOW (ref 4.22–5.81)
RDW: 13 % (ref 11.5–15.5)
WBC: 9.7 10*3/uL (ref 4.0–10.5)
nRBC: 0 % (ref 0.0–0.2)

## 2022-06-24 LAB — BASIC METABOLIC PANEL
Anion gap: 10 (ref 5–15)
BUN: 74 mg/dL — ABNORMAL HIGH (ref 6–20)
CO2: 21 mmol/L — ABNORMAL LOW (ref 22–32)
Calcium: 8.5 mg/dL — ABNORMAL LOW (ref 8.9–10.3)
Chloride: 112 mmol/L — ABNORMAL HIGH (ref 98–111)
Creatinine, Ser: 5.32 mg/dL — ABNORMAL HIGH (ref 0.61–1.24)
GFR, Estimated: 12 mL/min — ABNORMAL LOW (ref >60)
Glucose, Bld: 139 mg/dL — ABNORMAL HIGH (ref 70–99)
Potassium: 4 mmol/L (ref 3.5–5.1)
Sodium: 143 mmol/L (ref 135–145)

## 2022-06-24 LAB — ECHOCARDIOGRAM COMPLETE
AR max vel: 2.74 cm2
AV Peak grad: 7.4 mmHg
Ao pk vel: 1.36 m/s
Area-P 1/2: 3.05 cm2
S' Lateral: 3.15 cm
Single Plane A4C EF: 72 %

## 2022-06-24 LAB — GLUCOSE, CAPILLARY: Glucose-Capillary: 174 mg/dL — ABNORMAL HIGH (ref 70–99)

## 2022-06-24 MED ORDER — OXYCODONE-ACETAMINOPHEN 5-325 MG PO TABS
1.0000 | ORAL_TABLET | Freq: Four times a day (QID) | ORAL | Status: DC | PRN
  Administered 2022-06-24 – 2022-07-01 (×12): 1 via ORAL
  Filled 2022-06-24 (×11): qty 1

## 2022-06-24 MED ORDER — HYDRALAZINE HCL 50 MG PO TABS
100.0000 mg | ORAL_TABLET | Freq: Three times a day (TID) | ORAL | Status: DC
  Administered 2022-06-24 – 2022-06-29 (×13): 100 mg via ORAL
  Filled 2022-06-24 (×14): qty 2

## 2022-06-24 MED ORDER — IPRATROPIUM-ALBUTEROL 0.5-2.5 (3) MG/3ML IN SOLN
3.0000 mL | Freq: Three times a day (TID) | RESPIRATORY_TRACT | Status: DC
  Administered 2022-06-24 – 2022-06-25 (×5): 3 mL via RESPIRATORY_TRACT
  Filled 2022-06-24 (×6): qty 3

## 2022-06-24 MED ORDER — PANTOPRAZOLE SODIUM 40 MG PO TBEC
40.0000 mg | DELAYED_RELEASE_TABLET | Freq: Every day | ORAL | Status: DC
  Administered 2022-06-24 – 2022-07-01 (×8): 40 mg via ORAL
  Filled 2022-06-24 (×8): qty 1

## 2022-06-24 NOTE — Hospital Course (Signed)
[name] is a 56 y.o. 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 Lucky Cowboy), HTN, HLD, DM, COPD, asthma, depression with anxity, anemia, tobacco abuse, polysubstance abuse including cocaine, OSA on CPAP, mild cognitive impairment, dCHF, brain aneurysm, pancreatitis, SAH, vitreous hemorrhage, who presents with shortness breath and chest pain. Upon arrival to the emergency room, his blood pressure was 227/107, given IV hydralazine and nitroglycerin.  Patient also developed significant hypoxemia with oxygen saturation 85%.  He has a profound elevation of BNP, chest x-ray showed vascular congestion with interstitial edema.  Patient was diagnosed with acute on chronic congestive heart failure, was giving IV Lasix. 7/17.  Not able to complete a stress test, rescheduled for tomorrow.  Change diuretics to torsemide 40 mg daily.

## 2022-06-24 NOTE — Progress Notes (Signed)
Progress Note  Patient Name: Derek Cooley. Date of Encounter: 06/24/2022  [facility] Cardiologist: Kathlyn Sacramento, MD   Subjective   Feels better, shortness of breath is improved, chest pain also improved.  Inpatient Medications    Scheduled Meds:  amLODipine  10 mg Oral Daily   aspirin EC  81 mg Oral Daily   calcitRIOL  0.25 mcg Oral Daily   carvedilol  25 mg Oral BID WC   DULoxetine  60 mg Oral Daily   ezetimibe  10 mg Oral Daily   fenofibrate  54 mg Oral Daily   furosemide  80 mg Intravenous Q12H   guaiFENesin  600 mg Oral BID   heparin  5,000 Units Subcutaneous Q8H   hydrALAZINE  100 mg Oral TID   ipratropium-albuterol  3 mL Nebulization TID   methylPREDNISolone (SOLU-MEDROL) injection  40 mg Intravenous BID   mometasone-formoterol  2 puff Inhalation BID   nicotine  21 mg Transdermal Daily   pantoprazole  40 mg Oral QHS   pregabalin  50 mg Oral Daily   topiramate  50 mg Oral BID   traZODone  100 mg Oral QHS   Continuous Infusions:  PRN Meds: acetaminophen **OR** acetaminophen, albuterol, fentaNYL (SUBLIMAZE) injection, hydrALAZINE, hydrOXYzine, magnesium hydroxide, nitroGLYCERIN, ondansetron **OR** ondansetron (ZOFRAN) IV, traZODone   Vital Signs    Vitals:   06/24/22 0436 06/24/22 0437 06/24/22 0750 06/24/22 1215  BP:  (!) 158/55 (!) 169/87 137/68  Pulse:  70 74 72  Resp:   18 18  Temp: 97.9 F (36.6 C)  97.6 F (36.4 C) (!) 97.4 F (36.3 C)  TempSrc: Axillary     SpO2:  96% 98% 97%  Weight: 102.3 kg     Height:        Intake/Output Summary (Last 24 hours) at 06/24/2022 1227 Last data filed at 06/24/2022 1141 Gross per 24 hour  Intake 770 ml  Output 2250 ml  Net -1480 ml      06/24/2022    4:36 AM 06/23/2022    3:11 PM 05/29/2022    1:17 PM  Last 3 Weights  Weight (lbs) 225 lb 8.5 oz 223 lb 8.7 oz 218 lb 6.4 oz  Weight (kg) 102.3 kg 101.4 kg 99.066 kg      Telemetry    Sinus rhythm- Personally Reviewed  ECG     - Personally  Reviewed  Physical Exam   GEN: No acute distress.   Neck: No JVD Cardiac: RRR, no murmurs, rubs, or gallops.  Respiratory: Decreased breath sounds bilaterally GI: Soft, nontender, non-distended  MS: 1+ edema; No deformity. Neuro:  Nonfocal  Psych: Normal affect   Labs    High Sensitivity Troponin:   Recent Labs  Lab 06/22/22 2209 06/23/22 0156 06/23/22 0807 06/23/22 1350  TROPONINIHS 30* 27* 25* 24*     Chemistry Recent Labs  Lab 06/22/22 2209 06/23/22 0807 06/24/22 0609  NA 142 140 143  K 4.0 3.9 4.0  CL 111 114* 112*  CO2 23 21* 21*  GLUCOSE 144* 96 139*  BUN 55* 58* 74*  CREATININE 4.37* 4.37* 5.32*  CALCIUM 8.6* 8.5* 8.5*  PROT 6.9  --   --   ALBUMIN 3.9  --   --   AST 18  --   --   ALT 14  --   --   ALKPHOS 75  --   --   BILITOT 0.6  --   --   GFRNONAA 15* 15* 12*  ANIONGAP  8 5 10     Lipids  Recent Labs  Lab 06/23/22 0807  CHOL 201*  TRIG 83  HDL 47  LDLCALC 137*  CHOLHDL 4.3    Hematology Recent Labs  Lab 06/22/22 2209 06/23/22 0807 06/24/22 0609  WBC 9.0 8.5 9.7  RBC 3.49* 3.46* 3.51*  HGB 10.3* 10.2* 10.3*  HCT 32.4* 31.3* 31.3*  MCV 92.8 90.5 89.2  MCH 29.5 29.5 29.3  MCHC 31.8 32.6 32.9  RDW 13.3 13.4 13.0  PLT 229 236 222   Thyroid No results for input(s): "TSH", "FREET4" in the last 168 hours.  BNPNo results for input(s): "BNP", "PROBNP" in the last 168 hours.  DDimer No results for input(s): "DDIMER" in the last 168 hours.   Radiology    ECHOCARDIOGRAM COMPLETE  Result Date: 06/24/2022    ECHOCARDIOGRAM REPORT   Patient Name:   Derek Cooley. Date of Exam: 06/23/2022 Medical Rec #:  [id number]          Height:       67.5 in Accession #:    [id number]         Weight:       218.4 lb Date of Birth:  [birth year]         BSA:          2.111 m Patient Age:    56 years           BP:           149/82 mmHg Patient Gender: M                  HR:           71 bpm. Exam Location:  [facility] Procedure: 2D Echo Indications:     Chest Pain  R07.9  History:         Patient has prior history of Echocardiogram examinations, most                  recent 11/27/2021.  Sonographer:     Kathlen Brunswick RDCS Referring Phys:  [id number] Kate Sable Diagnosing Phys: Kate Sable MD  Sonographer Comments: Technically difficult study due to poor echo windows. IMPRESSIONS  1. Left ventricular ejection fraction, by estimation, is 60 to 65%. The left ventricle has normal function. The left ventricle has no regional wall motion abnormalities. There is mild left ventricular hypertrophy. Left ventricular diastolic parameters were normal.  2. Right ventricular systolic function is normal. The right ventricular size is normal.  3. The mitral valve is normal in structure. No evidence of mitral valve regurgitation.  4. The aortic valve is tricuspid. Aortic valve regurgitation is not visualized. Aortic valve sclerosis is present, with no evidence of aortic valve stenosis. FINDINGS  Left Ventricle: Left ventricular ejection fraction, by estimation, is 60 to 65%. The left ventricle has normal function. The left ventricle has no regional wall motion abnormalities. The left ventricular internal cavity size was normal in size. There is  mild left ventricular hypertrophy. Left ventricular diastolic parameters were normal. Right Ventricle: The right ventricular size is normal. No increase in right ventricular wall thickness. Right ventricular systolic function is normal. Left Atrium: Left atrial size was normal in size. Right Atrium: Right atrial size was normal in size. Pericardium: There is no evidence of pericardial effusion. Mitral Valve: The mitral valve is normal in structure. No evidence of mitral valve regurgitation. Tricuspid Valve: The tricuspid valve is not well visualized. Tricuspid valve regurgitation is not demonstrated. Aortic Valve:  The aortic valve is tricuspid. Aortic valve regurgitation is not visualized. Aortic valve sclerosis is present, with no  evidence of aortic valve stenosis. Aortic valve peak gradient measures 7.4 mmHg. Pulmonic Valve: The pulmonic valve was not well visualized. Pulmonic valve regurgitation is not visualized. Aorta: The aortic root and ascending aorta are structurally normal, with no evidence of dilitation. Venous: The inferior vena cava was not well visualized. IAS/Shunts: No atrial level shunt detected by color flow Doppler.  LEFT VENTRICLE PLAX 2D LVIDd:         4.79 cm      Diastology LVIDs:         3.15 cm      LV e' medial:    7.29 cm/s LV PW:         1.52 cm      LV E/e' medial:  11.9 LV IVS:        1.77 cm      LV e' lateral:   5.66 cm/s LVOT diam:     2.20 cm      LV E/e' lateral: 15.3 LV SV:         79 LV SV Index:   38 LVOT Area:     3.80 cm  LV Volumes (MOD) LV vol d, MOD A4C: 118.0 ml LV vol s, MOD A4C: 33.0 ml LV SV MOD A4C:     118.0 ml RIGHT VENTRICLE RV Basal diam:  3.35 cm RV S prime:     19.50 cm/s TAPSE (M-mode): 3.2 cm LEFT ATRIUM           Index        RIGHT ATRIUM           Index LA diam:      4.10 cm 1.94 cm/m   RA Area:     16.50 cm LA Vol (A2C): 76.1 ml 36.05 ml/m  RA Volume:   43.70 ml  20.70 ml/m LA Vol (A4C): 26.9 ml 12.74 ml/m  AORTIC VALVE                 PULMONIC VALVE AV Area (Vmax): 2.74 cm     PV Vmax:       1.06 m/s AV Vmax:        136.00 cm/s  PV Peak grad:  4.5 mmHg AV Peak Grad:   7.4 mmHg LVOT Vmax:      97.90 cm/s LVOT Vmean:     60.500 cm/s LVOT VTI:       0.209 m  AORTA Ao Root diam: 3.40 cm Ao Asc diam:  3.60 cm MITRAL VALVE MV Area (PHT): 3.05 cm    SHUNTS MV Decel Time: 249 msec    Systemic VTI:  0.21 m MV E velocity: 86.50 cm/s  Systemic Diam: 2.20 cm MV A velocity: 79.30 cm/s MV E/A ratio:  1.09 Kate Sable MD Electronically signed by Kate Sable MD Signature Date/Time: 06/24/2022/10:00:41 AM    Final    CT HEAD WO CONTRAST (5MM)  Result Date: 06/23/2022 CLINICAL DATA:  56 year old male with hypertensive emergency. Chest pain and shortness of breath. Cocaine. EXAM:  CT HEAD WITHOUT CONTRAST TECHNIQUE: Contiguous axial images were obtained from the base of the skull through the vertex without intravenous contrast. RADIATION DOSE REDUCTION: This exam was performed according to the departmental dose-optimization program which includes automated exposure control, adjustment of the mA and/or kV according to patient size and/or use of iterative reconstruction technique. COMPARISON:  Head CT 11/25/2021. FINDINGS: Brain: Streak artifact  from distal right ICA region aneurysm treatment. Chronic encephalomalacia in the nearby anterior right temporal lobe. 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 Chronic left frontal lobe white matter hypodensity which might be a previous EVD tract. Stable cerebral volume. No midline shift, ventriculomegaly, mass effect, evidence of mass lesion, intracranial hemorrhage or evidence of cortically based acute infarction. Gray-white matter differentiation is within normal limits throughout the brain. Vascular: Calcified atherosclerosis at the skull base. 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 No suspicious intracranial vascular hyperdensity. Skull: Previous right frontotemporal craniotomy is stable. Previous left vertex burr hole is stable. No acute osseous abnormality identified. Sinuses/Orbits: Bilateral maxillary sinus fluid levels. But minor paranasal sinus mucosal thickening elsewhere. Tympanic cavities and mastoids are clear. Other: Chronic scalp soft tissue scarring. No acute orbit or scalp soft tissue finding. IMPRESSION: 1. No acute intracranial abnormality. 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 Chronic right temporal lobe encephalomalacia. Chronic small vessel disease. 2. Bilateral maxillary sinus fluid levels raising the possibility of acute sinusitis. Electronically Signed   By: Genevie Ann M.D.   On:  06/23/2022 10:26   DG Chest Portable 1 View  Result Date: 06/22/2022 CLINICAL DATA:  Chest pain with shortness of breath. EXAM: PORTABLE CHEST 1 VIEW COMPARISON:  Portable chest 01/22/2022. FINDINGS: The heart silhouette is increased in size compared to the prior study. There is increased perihilar vascular congestion and development of interstitial edema in the perihilar areas and bases with small pleural effusions also beginning to form. No focal airspace infiltrate is seen. The lungs are otherwise clear. The mediastinum is normally outlined. There is moderate bilateral shoulder DJD. IMPRESSION: Cardiomegaly with vascular congestion and interstitial edema consistent with CHF or fluid overload. Small pleural effusions are forming but no focal infiltrate is seen. Progress chest films recommended depending on clinical response. Electronically Signed   By: Telford Nab M.D.   On: 06/22/2022 22:27    Cardiac Studies   TTE 06/23/2022 1. Left ventricular ejection fraction, by estimation, is 60 to 65%. The  left ventricle has normal function. The left ventricle has no regional  wall motion abnormalities. There is mild left ventricular hypertrophy.  Left ventricular diastolic parameters  were normal.   2. Right ventricular systolic function is normal. The right ventricular  size is normal.   3. The mitral valve is normal in structure. No evidence of mitral valve  regurgitation.   4. The aortic valve is tricuspid. Aortic valve regurgitation is not  visualized. Aortic valve sclerosis is present, with no evidence of aortic  valve stenosis.   Patient Profile     56 y.o. male with history of hypertension, diabetes, current smoker x 30+ years, COPD, CKD 5, cocaine use, subarachnoid hemorrhage s/p craniotomy and clipping 2020 who presents with worsening shortness of breath, atypical chest pain and lower extremity edema, being seen for edema and chest pain.  Assessment & Plan    HFpEF, shortness of  breath, leg edema -CKD possibly contributing to edema -Volume overloaded, -2.1 L over the past 24 hours. -Creatinine worse compared to yesterday -Continue Lasix as per nephrology recs   2.  Hypertension -BP improved but still elevated -Increase hydralazine to 100 mg 3 times daily -Continue Coreg, amlodipine, Lasix   3.  Chest pain, atypical -Troponin elevations -History of chronic atypical chest pain -Plan Lexiscan Myoview tomorrow    4.  COPD, wheezing -Shortness of breath improved -management as per medicine  team   5.  CKD 5 -Nephrology following -Management as per nephrology   6.  Current smoker -On nicotine patch -Cessation recommended   Total encounter time more than 50 minutes  Greater than 50% was spent in counseling and coordination of care with the patient    Signed, Kate Sable, MD  06/24/2022, 12:27 PM

## 2022-06-24 NOTE — Progress Notes (Signed)
Hospital owned CPAP set up in room. Patient and RN instructed to contact respiratory when patient is ready to go to sleep so RRT can place patient on CPAP.

## 2022-06-24 NOTE — Progress Notes (Signed)
PHARMACIST - PHYSICIAN COMMUNICATION  CONCERNING: IV to Oral Route Change Policy  RECOMMENDATION: This patient is receiving pantoprazole by the intravenous route.  Based on criteria approved by the Pharmacy and Therapeutics Committee, the intravenous medication(s) is/are being converted to the equivalent oral dose form(s).   DESCRIPTION: These criteria include: The patient is eating (either orally or via tube) and/or has been taking other orally administered medications for a least 24 hours The patient has no evidence of active gastrointestinal bleeding or impaired GI absorption (gastrectomy, short bowel, patient on TNA or NPO).  If you have questions about this conversion, please contact the [facility] [employer] 06/24/2022 9:41 AM

## 2022-06-24 NOTE — Progress Notes (Addendum)
  Progress Note   Patient: Derek Cooley. CBU:[id number] DOB: December 08, 1966 DOA: 06/23/2022     1 DOS: the patient was seen and examined on 06/24/2022   Brief hospital course: Rayman Petrosian. is a 56 y.o. 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 Lucky Cowboy), HTN, HLD, DM, COPD, asthma, depression with anxity, anemia, tobacco abuse, polysubstance abuse including cocaine, OSA on CPAP, mild cognitive impairment, dCHF, brain aneurysm, pancreatitis, SAH, vitreous hemorrhage, who presents with shortness breath and chest pain. Upon arrival to the emergency room, his blood pressure was 227/107, given IV hydralazine and nitroglycerin.  Patient also developed significant hypoxemia with oxygen saturation 85%.  He has a profound elevation of BNP, chest x-ray showed vascular congestion with interstitial edema.  Patient was diagnosed with acute on chronic congestive heart failure, was giving IV Lasix.  Assessment and Plan: Acute hypoxemic respiratory failure secondary to volume overload. Acute on chronic diastolic congestive heart failure with volume overload. Chronic kidney disease stage V. Mild metabolic acidosis Minimal elevation of troponin. Patient had a clear evidence worsening short of breath, volume overload with diastolic dysfunction.  He also requiring high flow oxygen. He was giving IV Lasix, condition has been improving.  Volume status is better, renal function slightly worsened.  Nephrology has switched diuretics to oral. Continue monitor renal function tomorrow. Cardiology has scheduled ischemia work-up.  COPD exacerbation. No bronchospasm today, continue inhalers, discontinue IV steroids.  Malignant hypertension. Blood pressure is better today.  Type 2 diabetes. Condition stable.  Anemia of chronic kidney disease. Hemoglobin stable.      Subjective:  Patient feels much improved today, short of breath better.  No  cough.  Physical Exam: Vitals:   06/24/22 0436 06/24/22 0437 06/24/22 0750 06/24/22 1215  BP:  (!) 158/55 (!) 169/87 137/68  Pulse:  70 74 72  Resp:   18 18  Temp: 97.9 F (36.6 C)  97.6 F (36.4 C) (!) 97.4 F (36.3 C)  TempSrc: Axillary     SpO2:  96% 98% 97%  Weight: 102.3 kg     Height:       General exam: Appears calm and comfortable  Respiratory system: Decreased breath sounds without wheezes or crackles. Respiratory effort normal. Cardiovascular system: S1 & S2 heard, RRR. No JVD, murmurs, rubs, gallops or clicks. No pedal edema. Gastrointestinal system: Abdomen is nondistended, soft and nontender. No organomegaly or masses felt. Normal bowel sounds heard. Central nervous system: Alert and oriented. No focal neurological deficits. Extremities: Symmetric 5 x 5 power. Skin: No rashes, lesions or ulcers Psychiatry: Judgement and insight appear normal. Mood & affect appropriate.   Data Reviewed:  Reviewed chest x-ray, all lab results  Family Communication:   Disposition: Status is: Inpatient Remains inpatient appropriate because: Severity of disease, ischemia work-up  Planned Discharge Destination: Home    Time spent: 35 minutes  Author: Sharen Hones, MD 06/24/2022 12:43 PM  For on call review [email or web address].

## 2022-06-24 NOTE — Progress Notes (Signed)
[place], [place] 06/24/22  Subjective:   Hospital day # 1 Patient known to our practice from outpatient follow-up of CHF.  Originally presented for increasing shortness of breath, chest pain, worsening lower extremity edema.  This morning, reported midepigastric tightness and burning chest pain, which is nonexertional.  Patient does not correlate with diet.  Nonreproducible. Nephrology consult requested for management of advanced CKD. Patient has a developing left arm AV fistula.    Renal: 07/15 0701 - 07/16 0700 In: 290 [P.O.:240; IV Piggyback:50] Out: 2400 [Urine:2400] Lab Results  Component Value Date   CREATININE 5.32 (H) 06/24/2022   CREATININE 4.37 (H) 06/23/2022   CREATININE 4.37 (H) 06/22/2022   7/16-this morning he feels well.  His creatinine unfortunately has increased compared to yesterday.  Breathing appears to be significantly better.  He is down to 2 L oxygen by nasal cannula (dialysis not use oxygen at home) urine output documented at 2400 cc.  Received furosemide 80 mg again this morning.     Objective:  Vital signs in last 24 hours:  Temp:  [97.5 F (36.4 C)-97.9 F (36.6 C)] 97.6 F (36.4 C) (07/16 0750) Pulse Rate:  [62-74] 74 (07/16 0750) Resp:  [15-19] 18 (07/16 0750) BP: (123-172)/(55-105) 169/87 (07/16 0750) SpO2:  [93 %-99 %] 98 % (07/16 0750) Weight:  [101.4 kg-102.3 kg] 102.3 kg (07/16 0436)  Weight change:  Filed Weights   06/23/22 1511 06/24/22 0436  Weight: 101.4 kg 102.3 kg    Intake/Output:    Intake/Output Summary (Last 24 hours) at 06/24/2022 1049 Last data filed at 06/24/2022 1008 Gross per 24 hour  Intake 770 ml  Output 1800 ml  Net -1030 ml      Physical Exam: General: Mild distress, laying in the bed  HEENT Anicteric, moist oral mucous membranes  Pulm/lungs Normal breathing effort, [place] O2, clear  CVS/Heart No rub, soft systolic murmur  Abdomen:  Obese, soft, nontender  Extremities: Trace  edema bilaterally  Neurologic: Alert, oriented.  Able to follow commands.  Skin: Warm, dry.  No acute rashes.  Access: Developing left arm AV fistula.  Good thrill.       Basic Metabolic Panel:  Recent Labs  Lab 06/22/22 2209 06/23/22 0807 06/24/22 0609  NA 142 140 143  K 4.0 3.9 4.0  CL 111 114* 112*  CO2 23 21* 21*  GLUCOSE 144* 96 139*  BUN 55* 58* 74*  CREATININE 4.37* 4.37* 5.32*  CALCIUM 8.6* 8.5* 8.5*      CBC: Recent Labs  Lab 06/22/22 2209 06/23/22 0807 06/24/22 0609  WBC 9.0 8.5 9.7  NEUTROABS 5.9  --   --   HGB 10.3* 10.2* 10.3*  HCT 32.4* 31.3* 31.3*  MCV 92.8 90.5 89.2  PLT 229 236 222      No results found for: "HEPBSAG", "HEPBSAB", "HEPBIGM"    Microbiology:  No results found for this or any previous visit (from the past 240 hour(s)).  Coagulation Studies: No results for input(s): "LABPROT", "INR" in the last 72 hours.  Urinalysis: No results for input(s): "COLORURINE", "LABSPEC", "PHURINE", "GLUCOSEU", "HGBUR", "BILIRUBINUR", "KETONESUR", "PROTEINUR", "UROBILINOGEN", "NITRITE", "LEUKOCYTESUR" in the last 72 hours.  Invalid input(s): "APPERANCEUR"    Imaging: ECHOCARDIOGRAM COMPLETE  Result Date: 06/24/2022    ECHOCARDIOGRAM REPORT   Patient Name:   Aztlan Coll. Date of Exam: 06/23/2022 Medical Rec #:  [id number]          Height:       67.5 in Accession #:  [id number]         Weight:       218.4 lb Date of Birth:  08-22-1966         BSA:          2.111 m Patient Age:    56 years           BP:           149/82 mmHg Patient Gender: M                  HR:           71 bpm. Exam Location:  [facility] Procedure: 2D Echo Indications:     Chest Pain R07.9  History:         Patient has prior history of Echocardiogram examinations, most                  recent 11/27/2021.  Sonographer:     Kathlen Brunswick RDCS Referring Phys:  [id number] Kate Sable Diagnosing Phys: Kate Sable MD  Sonographer Comments: Technically difficult study due to  poor echo windows. IMPRESSIONS  1. Left ventricular ejection fraction, by estimation, is 60 to 65%. The left ventricle has normal function. The left ventricle has no regional wall motion abnormalities. There is mild left ventricular hypertrophy. Left ventricular diastolic parameters were normal.  2. Right ventricular systolic function is normal. The right ventricular size is normal.  3. The mitral valve is normal in structure. No evidence of mitral valve regurgitation.  4. The aortic valve is tricuspid. Aortic valve regurgitation is not visualized. Aortic valve sclerosis is present, with no evidence of aortic valve stenosis. FINDINGS  Left Ventricle: Left ventricular ejection fraction, by estimation, is 60 to 65%. The left ventricle has normal function. The left ventricle has no regional wall motion abnormalities. The left ventricular internal cavity size was normal in size. There is  mild left ventricular hypertrophy. Left ventricular diastolic parameters were normal. Right Ventricle: The right ventricular size is normal. No increase in right ventricular wall thickness. Right ventricular systolic function is normal. Left Atrium: Left atrial size was normal in size. Right Atrium: Right atrial size was normal in size. Pericardium: There is no evidence of pericardial effusion. Mitral Valve: The mitral valve is normal in structure. No evidence of mitral valve regurgitation. Tricuspid Valve: The tricuspid valve is not well visualized. Tricuspid valve regurgitation is not demonstrated. Aortic Valve: The aortic valve is tricuspid. Aortic valve regurgitation is not visualized. Aortic valve sclerosis is present, with no evidence of aortic valve stenosis. Aortic valve peak gradient measures 7.4 mmHg. Pulmonic Valve: The pulmonic valve was not well visualized. Pulmonic valve regurgitation is not visualized. Aorta: The aortic root and ascending aorta are structurally normal, with no evidence of dilitation. Venous: The inferior  vena cava was not well visualized. IAS/Shunts: No atrial level shunt detected by color flow Doppler.  LEFT VENTRICLE PLAX 2D LVIDd:         4.79 cm      Diastology LVIDs:         3.15 cm      LV e' medial:    7.29 cm/s LV PW:         1.52 cm      LV E/e' medial:  11.9 LV IVS:        1.77 cm      LV e' lateral:   5.66 cm/s LVOT diam:     2.20 cm      LV  E/e' lateral: 15.3 LV SV:         79 LV SV Index:   38 LVOT Area:     3.80 cm  LV Volumes (MOD) LV vol d, MOD A4C: 118.0 ml LV vol s, MOD A4C: 33.0 ml LV SV MOD A4C:     118.0 ml RIGHT VENTRICLE RV Basal diam:  3.35 cm RV S prime:     19.50 cm/s TAPSE (M-mode): 3.2 cm LEFT ATRIUM           Index        RIGHT ATRIUM           Index LA diam:      4.10 cm 1.94 cm/m   RA Area:     16.50 cm LA Vol (A2C): 76.1 ml 36.05 ml/m  RA Volume:   43.70 ml  20.70 ml/m LA Vol (A4C): 26.9 ml 12.74 ml/m  AORTIC VALVE                 PULMONIC VALVE AV Area (Vmax): 2.74 cm     PV Vmax:       1.06 m/s AV Vmax:        136.00 cm/s  PV Peak grad:  4.5 mmHg AV Peak Grad:   7.4 mmHg LVOT Vmax:      97.90 cm/s LVOT Vmean:     60.500 cm/s LVOT VTI:       0.209 m  AORTA Ao Root diam: 3.40 cm Ao Asc diam:  3.60 cm MITRAL VALVE MV Area (PHT): 3.05 cm    SHUNTS MV Decel Time: 249 msec    Systemic VTI:  0.21 m MV E velocity: 86.50 cm/s  Systemic Diam: 2.20 cm MV A velocity: 79.30 cm/s MV E/A ratio:  1.09 Kate Sable MD Electronically signed by Kate Sable MD Signature Date/Time: 06/24/2022/10:00:41 AM    Final    CT HEAD WO CONTRAST (5MM)  Result Date: 06/23/2022 CLINICAL DATA:  55 year old male with hypertensive emergency. Chest pain and shortness of breath. Cocaine. EXAM: CT HEAD WITHOUT CONTRAST TECHNIQUE: Contiguous axial images were obtained from the base of the skull through the vertex without intravenous contrast. RADIATION DOSE REDUCTION: This exam was performed according to the departmental dose-optimization program which includes automated exposure control, adjustment  of the mA and/or kV according to patient size and/or use of iterative reconstruction technique. COMPARISON:  Head CT 11/25/2021. 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 Chronic encephalomalacia in the nearby anterior right temporal lobe. 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 Chronic left frontal lobe white matter hypodensity which might be a previous EVD tract. Stable cerebral volume. No midline shift, ventriculomegaly, mass effect, evidence of mass lesion, intracranial hemorrhage or evidence of cortically based acute infarction. Gray-white matter differentiation is within normal limits throughout the brain. Vascular: Calcified atherosclerosis at the skull base. 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 No suspicious intracranial vascular hyperdensity. Skull: Previous right frontotemporal craniotomy is stable. Previous left vertex burr hole is stable. No acute osseous abnormality identified. Sinuses/Orbits: Bilateral maxillary sinus fluid levels. But minor paranasal sinus mucosal thickening elsewhere. Tympanic cavities and mastoids are clear. Other: Chronic scalp soft tissue scarring. No acute orbit or scalp soft tissue finding. IMPRESSION: 1. No acute intracranial abnormality. 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 Chronic right temporal lobe encephalomalacia. Chronic small vessel disease. 2. Bilateral maxillary sinus fluid levels raising the possibility of  acute sinusitis. Electronically Signed   By: Genevie Ann M.D.   On: 06/23/2022 10:26   DG Chest Portable 1 View  Result Date: 06/22/2022 CLINICAL DATA:  Chest pain with shortness of breath. EXAM: PORTABLE CHEST 1 VIEW COMPARISON:  Portable chest 01/22/2022. FINDINGS: The heart silhouette is increased in size compared to the prior study. There is increased perihilar vascular  congestion and development of interstitial edema in the perihilar areas and bases with small pleural effusions also beginning to form. No focal airspace infiltrate is seen. The lungs are otherwise clear. The mediastinum is normally outlined. There is moderate bilateral shoulder DJD. IMPRESSION: Cardiomegaly with vascular congestion and interstitial edema consistent with CHF or fluid overload. Small pleural effusions are forming but no focal infiltrate is seen. Progress chest films recommended depending on clinical response. Electronically Signed   By: Telford Nab M.D.   On: 06/22/2022 22:27     Medications:     amLODipine  10 mg Oral Daily   aspirin EC  81 mg Oral Daily   calcitRIOL  0.25 mcg Oral Daily   carvedilol  25 mg Oral BID WC   DULoxetine  60 mg Oral Daily   ezetimibe  10 mg Oral Daily   fenofibrate  54 mg Oral Daily   furosemide  80 mg Intravenous Q12H   guaiFENesin  600 mg Oral BID   heparin  5,000 Units Subcutaneous Q8H   hydrALAZINE  100 mg Oral TID   ipratropium-albuterol  3 mL Nebulization TID   methylPREDNISolone (SOLU-MEDROL) injection  40 mg Intravenous BID   mometasone-formoterol  2 puff Inhalation BID   nicotine  21 mg Transdermal Daily   pantoprazole  40 mg Oral QHS   pregabalin  50 mg Oral Daily   topiramate  50 mg Oral BID   traZODone  100 mg Oral QHS   acetaminophen **OR** acetaminophen, albuterol, fentaNYL (SUBLIMAZE) injection, hydrALAZINE, hydrOXYzine, magnesium hydroxide, nitroGLYCERIN, ondansetron **OR** ondansetron (ZOFRAN) IV, traZODone  Assessment/ Plan:  56 y.o. male with type 2 diabetes, advanced CKD from diabetic nephropathy, history of polysubstance abuse  admitted on 06/23/2022 for Chronic chest pain [R07.9, G89.29] Fluid overload [E87.70] Chronic kidney disease, unspecified CKD stage [N18.9] Acute on chronic congestive heart failure, unspecified heart failure type (HCC) [I50.9]  #Chronic kidney disease stage V in the setting of diabetic  nephropathy. Outpatient creatinine of 4.36/GFR  14-15.  Creatinine slightly worse compared to yesterday however no overt uremic symptoms.  No acute indication for dialysis at present.  Will evaluate daily.  #Volume overload/malignant hypertension/lower extremity edema Presenting blood pressure of 227/107. Responding to IV furosemide Blood pressure now in the 140-170 range.  Current regimen includes amlodipine, carvedilol, IV furosemide, hydralazine.  Cardiology evaluation ongoing for chest pain. We will change to oral torsemide today.  #Anemia of chronic kidney disease  Lab Results  Component Value Date   HGB 10.3 (L) 06/24/2022  Hemoglobin level acceptable.  No indication for ESA.  Continue to monitor closely.     LOS: [place] 7/16/202310:49 AM  [place], [place]  Note: This note was prepared with Dragon dictation. Any transcription errors are unintentional

## 2022-06-25 ENCOUNTER — Inpatient Hospital Stay (HOSPITAL_COMMUNITY): Payer: Medicare Other

## 2022-06-25 DIAGNOSIS — J441 Chronic obstructive pulmonary disease with (acute) exacerbation: Secondary | ICD-10-CM | POA: Diagnosis not present

## 2022-06-25 DIAGNOSIS — R079 Chest pain, unspecified: Secondary | ICD-10-CM

## 2022-06-25 DIAGNOSIS — J9601 Acute respiratory failure with hypoxia: Secondary | ICD-10-CM | POA: Diagnosis not present

## 2022-06-25 DIAGNOSIS — N185 Chronic kidney disease, stage 5: Secondary | ICD-10-CM | POA: Diagnosis not present

## 2022-06-25 DIAGNOSIS — I5033 Acute on chronic diastolic (congestive) heart failure: Secondary | ICD-10-CM | POA: Diagnosis not present

## 2022-06-25 LAB — BASIC METABOLIC PANEL
Anion gap: 9 (ref 5–15)
BUN: 93 mg/dL — ABNORMAL HIGH (ref 6–20)
CO2: 20 mmol/L — ABNORMAL LOW (ref 22–32)
Calcium: 8.3 mg/dL — ABNORMAL LOW (ref 8.9–10.3)
Chloride: 111 mmol/L (ref 98–111)
Creatinine, Ser: 5.72 mg/dL — ABNORMAL HIGH (ref 0.61–1.24)
GFR, Estimated: 11 mL/min — ABNORMAL LOW (ref >60)
Glucose, Bld: 135 mg/dL — ABNORMAL HIGH (ref 70–99)
Potassium: 3.8 mmol/L (ref 3.5–5.1)
Sodium: 140 mmol/L (ref 135–145)

## 2022-06-25 LAB — MAGNESIUM: Magnesium: 2.4 mg/dL (ref 1.7–2.4)

## 2022-06-25 MED ORDER — ASPIRIN 81 MG PO TBEC: 81.0000 mg | DELAYED_RELEASE_TABLET | Freq: Every day | ORAL | 0 refills | Status: DC

## 2022-06-25 MED ORDER — PATIROMER SORBITEX CALCIUM 8.4 G PO PACK: 8.4000 g | PACK | ORAL | 0 refills | Status: DC

## 2022-06-25 MED ORDER — TORSEMIDE 20 MG PO TABS
40.0000 mg | ORAL_TABLET | Freq: Every day | ORAL | Status: DC
  Administered 2022-06-26 – 2022-07-02 (×7): 40 mg via ORAL
  Filled 2022-06-25 (×7): qty 2

## 2022-06-25 MED ORDER — LOSARTAN POTASSIUM 50 MG PO TABS
50.0000 mg | ORAL_TABLET | Freq: Every day | ORAL | Status: DC
  Administered 2022-06-25 – 2022-07-02 (×8): 50 mg via ORAL
  Filled 2022-06-25 (×8): qty 1

## 2022-06-25 MED ORDER — SODIUM BICARBONATE 650 MG PO TABS: 650.0000 mg | ORAL_TABLET | Freq: Three times a day (TID) | ORAL | 0 refills | Status: DC

## 2022-06-25 MED ORDER — FUROSEMIDE 40 MG PO TABS: 40.0000 mg | ORAL_TABLET | Freq: Every day | ORAL | 0 refills | Status: DC

## 2022-06-25 MED ORDER — EZETIMIBE 10 MG PO TABS: 10.0000 mg | ORAL_TABLET | Freq: Every day | ORAL | 0 refills | Status: DC

## 2022-06-25 MED ORDER — TECHNETIUM TC 99M TETROFOSMIN IV KIT
10.7500 | PACK | Freq: Once | INTRAVENOUS | Status: AC | PRN
  Administered 2022-06-25: 10.75 via INTRAVENOUS

## 2022-06-25 MED ORDER — SODIUM BICARBONATE 650 MG PO TABS
650.0000 mg | ORAL_TABLET | Freq: Three times a day (TID) | ORAL | Status: DC
Start: 2022-06-25 — End: 2022-07-01
  Administered 2022-06-25 – 2022-07-01 (×17): 650 mg via ORAL
  Filled 2022-06-25 (×16): qty 1

## 2022-06-25 NOTE — Progress Notes (Signed)
  Progress Note   Patient: Derek Cooley. RFF:[id number] DOB: 05-24-1966 DOA: 06/23/2022     2 DOS: the patient was seen and examined on 06/25/2022   Brief hospital course: Derek Cooley. is a 56 y.o. 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 Lucky Cowboy), HTN, HLD, DM, COPD, asthma, depression with anxity, anemia, tobacco abuse, polysubstance abuse including cocaine, OSA on CPAP, mild cognitive impairment, dCHF, brain aneurysm, pancreatitis, SAH, vitreous hemorrhage, who presents with shortness breath and chest pain. Upon arrival to the emergency room, his blood pressure was 227/107, given IV hydralazine and nitroglycerin.  Patient also developed significant hypoxemia with oxygen saturation 85%.  He has a profound elevation of BNP, chest x-ray showed vascular congestion with interstitial edema.  Patient was diagnosed with acute on chronic congestive heart failure, was giving IV Lasix. 7/17.  Not able to complete a stress test, rescheduled for tomorrow.  Change diuretics to torsemide 40 mg daily.  Assessment and Plan: Acute on chronic diastolic congestive heart failure with volume overload. Chronic kidney disease stage V. Mild metabolic acidosis Minimal elevation of troponin. Short of breath much improved, volume status better.  Renal function worse today.  Change diuretics to oral torsemide. Recheck renal function tomorrow. Stress test is rescheduled for tomorrow.   COPD exacerbation. Doing well, no bronchospasm.   Malignant hypertension. Patient was initially scheduled for stress test today, beta-blocker was on hold, blood pressure running high.  I did not start losartan.  Anticipating holding beta-blocker again tomorrow.   Type 2 diabetes. Condition stable.  Anemia of chronic kidney disease. Hemoglobin stable.      Subjective:  Patient doing well today, no short of breath.   Physical Exam: Vitals:   06/25/22 0403  06/25/22 0531 06/25/22 0753 06/25/22 0826  BP:  (!) 148/73  (!) 169/87  Pulse:  72  66  Resp: 18 18    Temp:  98.1 F (36.7 C)  (!) 97.5 F (36.4 C)  TempSrc:  Oral  Oral  SpO2:  97% 95% 95%  Weight: 102.3 kg     Height:       General exam: Appears calm and comfortable  Respiratory system: Clear to auscultation. Respiratory effort normal. Cardiovascular system: S1 & S2 heard, RRR. No JVD, murmurs, rubs, gallops or clicks. No pedal edema. Gastrointestinal system: Abdomen is nondistended, soft and nontender. No organomegaly or masses felt. Normal bowel sounds heard. Central nervous system: Alert and oriented. No focal neurological deficits. Extremities: Symmetric 5 x 5 power. Skin: No rashes, lesions or ulcers Psychiatry: Judgement and insight appear normal. Mood & affect appropriate.   Data Reviewed:  Lab results reviewed.  Family Communication:   Disposition: Status is: Inpatient Remains inpatient appropriate because: Severity of disease, pending inpatient procedure.  Planned Discharge Destination: Home    Time spent: 35 minutes  Author: Sharen Hones, MD 06/25/2022 1:22 PM  For on call review [email or web address].

## 2022-06-25 NOTE — Progress Notes (Signed)
[place], [place] 06/25/22  Subjective:   Hospital day # 2 Patient known to our practice from outpatient follow-up of CHF.  Originally presented for increasing shortness of breath, chest pain, worsening lower extremity edema.  This morning, reported midepigastric tightness and burning chest pain, which is nonexertional.  Patient does not correlate with diet.  Nonreproducible. Nephrology consult requested for management of advanced CKD. Patient has a developing left arm AV fistula.    Renal: 07/16 0701 - 07/17 0700 In: 480 [P.O.:480] Out: 1650 [Urine:1650] Lab Results  Component Value Date   CREATININE 5.72 (H) 06/25/2022   CREATININE 5.32 (H) 06/24/2022   CREATININE 4.37 (H) 06/23/2022   Patient seen sitting up in bed, alert and oriented Currently states he is awaiting stress test. Denies pain or discomfort Currently n.p.o. for procedure Currently on room air No lower extremity edema  Urine output 1.6 L recorded in previous 24 hours. Creatinine 5.72   Objective:  Vital signs in last 24 hours:  Temp:  [97.5 F (36.4 C)-98.4 F (36.9 C)] 97.5 F (36.4 C) (07/17 0826) Pulse Rate:  [66-79] 66 (07/17 0826) Resp:  [17-19] 18 (07/17 0531) BP: (148-169)/(73-87) 169/87 (07/17 0826) SpO2:  [93 %-98 %] 95 % (07/17 0826) FiO2 (%):  [21 %] 21 % (07/17 0031) Weight:  [102.3 kg] 102.3 kg (07/17 0403)  Weight change: 0.9 kg Filed Weights   06/23/22 1511 06/24/22 0436 06/25/22 0403  Weight: 101.4 kg 102.3 kg 102.3 kg    Intake/Output:    Intake/Output Summary (Last 24 hours) at 06/25/2022 1357 Last data filed at 06/25/2022 0536 Gross per 24 hour  Intake --  Output 1200 ml  Net -1200 ml      Physical Exam: General: NAD, laying in the bed  HEENT Anicteric, moist oral mucous membranes  Pulm/lungs Normal breathing effort, [place] O2, clear  CVS/Heart No rub, soft systolic murmur  Abdomen:  Obese, soft, nontender  Extremities: Trace edema  bilaterally  Neurologic: Alert, oriented.  Able to follow commands.  Skin: Warm, dry.  No acute rashes.  Access: Developing left arm AV fistula.  Good thrill.       Basic Metabolic Panel:  Recent Labs  Lab 06/22/22 2209 06/23/22 0807 06/24/22 0609 06/25/22 0445  NA 142 140 143 140  K 4.0 3.9 4.0 3.8  CL 111 114* 112* 111  CO2 23 21* 21* 20*  GLUCOSE 144* 96 139* 135*  BUN 55* 58* 74* 93*  CREATININE 4.37* 4.37* 5.32* 5.72*  CALCIUM 8.6* 8.5* 8.5* 8.3*  MG  --   --   --  2.4      CBC: Recent Labs  Lab 06/22/22 2209 06/23/22 0807 06/24/22 0609  WBC 9.0 8.5 9.7  NEUTROABS 5.9  --   --   HGB 10.3* 10.2* 10.3*  HCT 32.4* 31.3* 31.3*  MCV 92.8 90.5 89.2  PLT 229 236 222      No results found for: "HEPBSAG", "HEPBSAB", "HEPBIGM"    Microbiology:  No results found for this or any previous visit (from the past 240 hour(s)).  Coagulation Studies: No results for input(s): "LABPROT", "INR" in the last 72 hours.  Urinalysis: No results for input(s): "COLORURINE", "LABSPEC", "PHURINE", "GLUCOSEU", "HGBUR", "BILIRUBINUR", "KETONESUR", "PROTEINUR", "UROBILINOGEN", "NITRITE", "LEUKOCYTESUR" in the last 72 hours.  Invalid input(s): "APPERANCEUR"    Imaging: ECHOCARDIOGRAM COMPLETE  Result Date: 06/24/2022    ECHOCARDIOGRAM REPORT   Patient Name:   Derek Cooley. Date of Exam: 06/23/2022 Medical Rec #:  [id number]          Height:       67.5 in Accession #:    [id number]         Weight:       218.4 lb Date of Birth:  09-03-1966         BSA:          2.111 m Patient Age:    56 years           BP:           149/82 mmHg Patient Gender: M                  HR:           71 bpm. Exam Location:  [facility] Procedure: 2D Echo Indications:     Chest Pain R07.9  History:         Patient has prior history of Echocardiogram examinations, most                  recent 11/27/2021.  Sonographer:     Kathlen Brunswick RDCS Referring Phys:  [id number] Kate Sable Diagnosing Phys: Kate Sable MD  Sonographer Comments: Technically difficult study due to poor echo windows. IMPRESSIONS  1. Left ventricular ejection fraction, by estimation, is 60 to 65%. The left ventricle has normal function. The left ventricle has no regional wall motion abnormalities. There is mild left ventricular hypertrophy. Left ventricular diastolic parameters were normal.  2. Right ventricular systolic function is normal. The right ventricular size is normal.  3. The mitral valve is normal in structure. No evidence of mitral valve regurgitation.  4. The aortic valve is tricuspid. Aortic valve regurgitation is not visualized. Aortic valve sclerosis is present, with no evidence of aortic valve stenosis. FINDINGS  Left Ventricle: Left ventricular ejection fraction, by estimation, is 60 to 65%. The left ventricle has normal function. The left ventricle has no regional wall motion abnormalities. The left ventricular internal cavity size was normal in size. There is  mild left ventricular hypertrophy. Left ventricular diastolic parameters were normal. Right Ventricle: The right ventricular size is normal. No increase in right ventricular wall thickness. Right ventricular systolic function is normal. Left Atrium: Left atrial size was normal in size. Right Atrium: Right atrial size was normal in size. Pericardium: There is no evidence of pericardial effusion. Mitral Valve: The mitral valve is normal in structure. No evidence of mitral valve regurgitation. Tricuspid Valve: The tricuspid valve is not well visualized. Tricuspid valve regurgitation is not demonstrated. Aortic Valve: The aortic valve is tricuspid. Aortic valve regurgitation is not visualized. Aortic valve sclerosis is present, with no evidence of aortic valve stenosis. Aortic valve peak gradient measures 7.4 mmHg. Pulmonic Valve: The pulmonic valve was not well visualized. Pulmonic valve regurgitation is not visualized. Aorta: The aortic root and ascending aorta are  structurally normal, with no evidence of dilitation. Venous: The inferior vena cava was not well visualized. IAS/Shunts: No atrial level shunt detected by color flow Doppler.  LEFT VENTRICLE PLAX 2D LVIDd:         4.79 cm      Diastology LVIDs:         3.15 cm      LV e' medial:    7.29 cm/s LV PW:         1.52 cm      LV E/e' medial:  11.9 LV IVS:        1.77 cm  LV e' lateral:   5.66 cm/s LVOT diam:     2.20 cm      LV E/e' lateral: 15.3 LV SV:         79 LV SV Index:   38 LVOT Area:     3.80 cm  LV Volumes (MOD) LV vol d, MOD A4C: 118.0 ml LV vol s, MOD A4C: 33.0 ml LV SV MOD A4C:     118.0 ml RIGHT VENTRICLE RV Basal diam:  3.35 cm RV S prime:     19.50 cm/s TAPSE (M-mode): 3.2 cm LEFT ATRIUM           Index        RIGHT ATRIUM           Index LA diam:      4.10 cm 1.94 cm/m   RA Area:     16.50 cm LA Vol (A2C): 76.1 ml 36.05 ml/m  RA Volume:   43.70 ml  20.70 ml/m LA Vol (A4C): 26.9 ml 12.74 ml/m  AORTIC VALVE                 PULMONIC VALVE AV Area (Vmax): 2.74 cm     PV Vmax:       1.06 m/s AV Vmax:        136.00 cm/s  PV Peak grad:  4.5 mmHg AV Peak Grad:   7.4 mmHg LVOT Vmax:      97.90 cm/s LVOT Vmean:     60.500 cm/s LVOT VTI:       0.209 m  AORTA Ao Root diam: 3.40 cm Ao Asc diam:  3.60 cm MITRAL VALVE MV Area (PHT): 3.05 cm    SHUNTS MV Decel Time: 249 msec    Systemic VTI:  0.21 m MV E velocity: 86.50 cm/s  Systemic Diam: 2.20 cm MV A velocity: 79.30 cm/s MV E/A ratio:  1.09 Kate Sable MD Electronically signed by Kate Sable MD Signature Date/Time: 06/24/2022/10:00:41 AM    Final      Medications:     amLODipine  10 mg Oral Daily   aspirin EC  81 mg Oral Daily   calcitRIOL  0.25 mcg Oral Daily   carvedilol  25 mg Oral BID WC   DULoxetine  60 mg Oral Daily   ezetimibe  10 mg Oral Daily   guaiFENesin  600 mg Oral BID   heparin  5,000 Units Subcutaneous Q8H   hydrALAZINE  100 mg Oral TID   ipratropium-albuterol  3 mL Nebulization TID   losartan  50 mg Oral Daily    mometasone-formoterol  2 puff Inhalation BID   pantoprazole  40 mg Oral QHS   pregabalin  50 mg Oral Daily   sodium bicarbonate  650 mg Oral TID   topiramate  50 mg Oral BID   [START ON 06/26/2022] torsemide  40 mg Oral Daily   traZODone  100 mg Oral QHS   acetaminophen **OR** acetaminophen, albuterol, hydrALAZINE, hydrOXYzine, magnesium hydroxide, nitroGLYCERIN, ondansetron **OR** ondansetron (ZOFRAN) IV, oxyCODONE-acetaminophen, traZODone  Assessment/ Plan:  56 y.o. male with type 2 diabetes, advanced CKD from diabetic nephropathy, history of polysubstance abuse  admitted on 06/23/2022 for Chronic chest pain [R07.9, G89.29] Fluid overload [E87.70] Chronic kidney disease, unspecified CKD stage [N18.9] Acute on chronic congestive heart failure, unspecified heart failure type (HCC) [I50.9]  #Chronic kidney disease stage V in the setting of diabetic nephropathy. Outpatient creatinine of 4.36/GFR  14-15.  Renal function continues to decline likely due to progression of  kidney disease and aggressive diuresis.  Patient recently had fistula placed, currently maturing.  Patient is cleared to discharge from renal stance after scheduled procedures.  Patient will receive close follow-up outpatient with likely initiation of hemodialysis.  #Volume overload/malignant hypertension/lower extremity edema Presenting blood pressure of 227/107. Responding to IV furosemide Current regimen includes amlodipine, carvedilol, IV furosemide, hydralazine. Blood pressure 169/87  #Anemia of chronic kidney disease  Lab Results  Component Value Date   HGB 10.3 (L) 06/24/2022  Hemoglobin level acceptable.  No indication for ESA.  Continue to monitor closely.     LOS: 2 Colon [place] 7/17/20231:57 PM  [place], [place]  Note: This note was prepared with Dragon dictation. Any transcription errors are unintentional

## 2022-06-25 NOTE — TOC Initial Note (Signed)
Transition of Care (TOC) - Initial/Assessment Note    Patient Details  Name: Derek Cooley. MRN: [id number] Date of Birth: 1966/02/10  Transition of Care [facility]) CM/SW Contact:    Candie Chroman, LCSW Phone Number: 06/25/2022, 3:35 PM  Clinical Narrative: Readmission prevention screen complete. CSW met with patient. No supports at bedside. CSW started assessment but patient appeared to have some confusion so he gave CSW permission to contact his sister. Called sister, introduced role, and explained that discharge planning would be discussed. Patient lives home with a friend. PCP is Jon Billings, NP at [facility]. Sister drives him to appointments. Pharmacy is [facility] in [place]. No issues obtaining medications. No home health or DME use prior to admission. Sister will transport him home at discharge. No further concerns. CSW encouraged patient and his sister to contact CSW as needed. CSW will continue to follow patient and his sister for support and facilitate return home once stable.                Expected Discharge Plan: Home/Self Care Barriers to Discharge: Continued Medical Work up   Patient Goals and CMS Choice        Expected Discharge Plan and Services Expected Discharge Plan: Home/Self Care     Post Acute Care Choice: NA Living arrangements for the past 2 months: Single Family Home Expected Discharge Date: 06/25/22                                    Prior Living Arrangements/Services Living arrangements for the past 2 months: Single Family Home Lives with:: Friends Patient language and need for interpreter reviewed:: Yes Do you feel safe going back to the place where you live?: Yes      Need for Family Participation in Patient Care: Yes (Comment) Care giver support system in place?: Yes (comment)   Criminal Activity/Legal Involvement Pertinent to Current Situation/Hospitalization: No - Comment as needed  Activities of Daily Living Home  Assistive Devices/Equipment: None ADL Screening (condition at time of admission) Patient's cognitive ability adequate to safely complete daily activities?: No Is the patient deaf or have difficulty hearing?: No Does the patient have difficulty seeing, even when wearing glasses/contacts?: No Does the patient have difficulty concentrating, remembering, or making decisions?: Yes Patient able to express need for assistance with ADLs?: Yes Does the patient have difficulty dressing or bathing?: No Independently performs ADLs?: Yes (appropriate for developmental age) Does the patient have difficulty walking or climbing stairs?: No Weakness of Legs: None Weakness of Arms/Hands: None  Permission Sought/Granted Permission sought to share information with : Family Supports Permission granted to share information with : Yes, Verbal Permission Granted  Share Information with NAME: [name] Reel     Permission granted to share info w Relationship: Sister  Permission granted to share info w Contact Information: [phone]  Emotional Assessment Appearance:: Appears stated age Attitude/Demeanor/Rapport: Engaged Affect (typically observed): Pleasant Orientation: : Oriented to Self, Oriented to Place, Oriented to  Time, Oriented to Situation Alcohol / Substance Use: Not Applicable Psych Involvement: No (comment)  Admission diagnosis:  Chronic chest pain [R07.9, G89.29] Fluid overload [E87.70] Chronic kidney disease, unspecified CKD stage [N18.9] Acute on chronic congestive heart failure, unspecified heart failure type [facility]) [I50.9] Patient Active Problem List   Diagnosis Date Noted   Fluid overload 06/23/2022   Acute respiratory failure with hypoxia ([place]) 06/23/2022   HLD (hyperlipidemia)  Chest pain    Type II diabetes mellitus with renal manifestations ([place])    Depression with anxiety    Anemia in CKD (chronic kidney disease)    Heart failure with preserved ejection fraction (HCC)    CKD  (chronic kidney disease), stage V ([place]) 05/16/2022   Acute kidney failure ([place]) 12/11/2021   Acute on chronic diastolic CHF (congestive heart failure) ([place]) 12/07/2021   Broken ribs 12/07/2021   Hypertensive crisis 12/07/2021   Demand ischemia ([place])    Malignant hypertension 11/25/2021   Flash pulmonary edema ([place]) 11/25/2021   Cocaine abuse ([place]) 11/25/2021   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   Nonadherence to medication 07/06/2020   Encounter for screening colonoscopy    AKI (acute kidney injury) ([place]) 08/16/2019   Altered mental status 08/16/2019   Substance abuse ([place]) 08/16/2019   Hypertriglyceridemia 07/06/2019   Statin intolerance 01/02/2019   Acute bronchitis due to other specified organisms 12/01/2018   Hx of aneurysm 10/16/2017   Diabetes ([place]) 04/17/2016   COPD exacerbation ([place]) 04/17/2016   GERD (gastroesophageal reflux disease) 08/25/2015   High blood cholesterol 05/16/2015   Depression 05/16/2015   Chronic anxiety 05/16/2015   Benign hypertensive renal disease 05/16/2015   Persistent headaches 05/16/2015   CKD (chronic kidney disease) stage 2, GFR 60-89 ml/min 05/16/2015   Tobacco abuse 05/16/2015   Abdominal wall abscess 02/01/2014   Infection and inflammatory reaction due to other internal prosthetic devices, implants and grafts, initial encounter ([place]) 01/05/2014   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   Wound drainage 07/13/2013   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/2013   Obstructive apnea 06/23/2013   Cerebral aneurysm 06/23/2013   Mild cognitive disorder 03/27/2011   Mild cognitive impairment 03/27/2011   Vitreous hemorrhage ([place]) 03/27/2011   Hypertension associated with diabetes ([place]) 03/06/2011   Adiposity 03/06/2011   PCP:  Jon Billings, NP Pharmacy:   [facility] [place], [place] - [address] [place] [address] Phone: [phone] Fax:  [facility] #[id number] [address], [place] AT [facility] OF [address] [address] Phone: [phone] Fax: [phone]     Social Determinants of Health (SDOH) Interventions    Readmission Risk Interventions    06/25/2022    3:34 PM  Readmission Risk Prevention Plan  Transportation Screening Complete  Medication Review (RN Care Manager) Complete  PCP or Specialist appointment within 3-5 days of discharge Complete  SW Recovery Care/Counseling Consult Complete  Palliative Care Screening Not [place] Not Applicable

## 2022-06-25 NOTE — Progress Notes (Signed)
Progress Note  Patient Name: Derek Cooley. Date of Encounter: 06/25/2022  [facility] Cardiologist: Kathlyn Sacramento, MD   Subjective   Overall improving shortness of breath and chest pain.  Plan for myoview today.  Inpatient Medications    Scheduled Meds:  amLODipine  10 mg Oral Daily   aspirin EC  81 mg Oral Daily   calcitRIOL  0.25 mcg Oral Daily   carvedilol  25 mg Oral BID WC   DULoxetine  60 mg Oral Daily   ezetimibe  10 mg Oral Daily   fenofibrate  54 mg Oral Daily   furosemide  80 mg Intravenous Q12H   guaiFENesin  600 mg Oral BID   heparin  5,000 Units Subcutaneous Q8H   hydrALAZINE  100 mg Oral TID   ipratropium-albuterol  3 mL Nebulization TID   mometasone-formoterol  2 puff Inhalation BID   pantoprazole  40 mg Oral QHS   pregabalin  50 mg Oral Daily   sodium bicarbonate  650 mg Oral TID   topiramate  50 mg Oral BID   traZODone  100 mg Oral QHS   Continuous Infusions:  PRN Meds: acetaminophen **OR** acetaminophen, albuterol, hydrALAZINE, hydrOXYzine, magnesium hydroxide, nitroGLYCERIN, ondansetron **OR** ondansetron (ZOFRAN) IV, oxyCODONE-acetaminophen, traZODone   Vital Signs    Vitals:   06/25/22 0009 06/25/22 0031 06/25/22 0403 06/25/22 0531  BP: (!) 153/83   (!) 148/73  Pulse: 68   72  Resp:  17 18 18   Temp: 97.7 F (36.5 C)   98.1 F (36.7 C)  TempSrc: Oral   Oral  SpO2:    97%  Weight:   102.3 kg   Height:        Intake/Output Summary (Last 24 hours) at 06/25/2022 0820 Last data filed at 06/25/2022 0536 Gross per 24 hour  Intake 240 ml  Output 1650 ml  Net -1410 ml      06/25/2022    4:03 AM 06/24/2022    4:36 AM 06/23/2022    3:11 PM  Last 3 Weights  Weight (lbs) 225 lb 8.5 oz 225 lb 8.5 oz 223 lb 8.7 oz  Weight (kg) 102.3 kg 102.3 kg 101.4 kg      Telemetry    NSR rate in 70's, occasional PVC's - Personally Reviewed  ECG    No new readings - Personally Reviewed  Physical Exam   GEN: No acute distress.   Neck: No  JVD Cardiac: RRR, no murmurs, rubs, or gallops.  Respiratory: Diminished breath sounds bilaterally. GI: Soft, nontender, non-distended  MS: +1 edema; No deformity. Neuro:  Nonfocal  Psych: Normal affect   Labs    High Sensitivity Troponin:   Recent Labs  Lab 06/22/22 2209 06/23/22 0156 06/23/22 0807 06/23/22 1350  TROPONINIHS 30* 27* 25* 24*     Chemistry Recent Labs  Lab 06/22/22 2209 06/23/22 0807 06/24/22 0609 06/25/22 0445  NA 142 140 143 140  K 4.0 3.9 4.0 3.8  CL 111 114* 112* 111  CO2 23 21* 21* 20*  GLUCOSE 144* 96 139* 135*  BUN 55* 58* 74* 93*  CREATININE 4.37* 4.37* 5.32* 5.72*  CALCIUM 8.6* 8.5* 8.5* 8.3*  MG  --   --   --  2.4  PROT 6.9  --   --   --   ALBUMIN 3.9  --   --   --   AST 18  --   --   --   ALT 14  --   --   --  ALKPHOS 75  --   --   --   BILITOT 0.6  --   --   --   GFRNONAA 15* 15* 12* 11*  ANIONGAP 8 5 10 9     Lipids  Recent Labs  Lab 06/23/22 0807  CHOL 201*  TRIG 83  HDL 47  LDLCALC 137*  CHOLHDL 4.3    Hematology Recent Labs  Lab 06/22/22 2209 06/23/22 0807 06/24/22 0609  WBC 9.0 8.5 9.7  RBC 3.49* 3.46* 3.51*  HGB 10.3* 10.2* 10.3*  HCT 32.4* 31.3* 31.3*  MCV 92.8 90.5 89.2  MCH 29.5 29.5 29.3  MCHC 31.8 32.6 32.9  RDW 13.3 13.4 13.0  PLT 229 236 222   Thyroid No results for input(s): "TSH", "FREET4" in the last 168 hours.  BNPNo results for input(s): "BNP", "PROBNP" in the last 168 hours.  DDimer No results for input(s): "DDIMER" in the last 168 hours.   Radiology    ECHOCARDIOGRAM COMPLETE  Result Date: 06/24/2022    ECHOCARDIOGRAM REPORT   Patient Name:   Dyson Sevey. Date of Exam: 06/23/2022 Medical Rec #:  [id number]          Height:       67.5 in Accession #:    [id number]         Weight:       218.4 lb Date of Birth:  1966-06-02         BSA:          2.111 m Patient Age:    73 years           BP:           149/82 mmHg Patient Gender: M                  HR:           71 bpm. Exam Location:  [facility]  Procedure: 2D Echo Indications:     Chest Pain R07.9  History:         Patient has prior history of Echocardiogram examinations, most                  recent 11/27/2021.  Sonographer:     Kathlen Brunswick RDCS Referring Phys:  [id number] Kate Sable Diagnosing Phys: Kate Sable MD  Sonographer Comments: Technically difficult study due to poor echo windows. IMPRESSIONS  1. Left ventricular ejection fraction, by estimation, is 60 to 65%. The left ventricle has normal function. The left ventricle has no regional wall motion abnormalities. There is mild left ventricular hypertrophy. Left ventricular diastolic parameters were normal.  2. Right ventricular systolic function is normal. The right ventricular size is normal.  3. The mitral valve is normal in structure. No evidence of mitral valve regurgitation.  4. The aortic valve is tricuspid. Aortic valve regurgitation is not visualized. Aortic valve sclerosis is present, with no evidence of aortic valve stenosis. FINDINGS  Left Ventricle: Left ventricular ejection fraction, by estimation, is 60 to 65%. The left ventricle has normal function. The left ventricle has no regional wall motion abnormalities. The left ventricular internal cavity size was normal in size. There is  mild left ventricular hypertrophy. Left ventricular diastolic parameters were normal. Right Ventricle: The right ventricular size is normal. No increase in right ventricular wall thickness. Right ventricular systolic function is normal. Left Atrium: Left atrial size was normal in size. Right Atrium: Right atrial size was normal in size. Pericardium: There is no evidence of pericardial effusion.  Mitral Valve: The mitral valve is normal in structure. No evidence of mitral valve regurgitation. Tricuspid Valve: The tricuspid valve is not well visualized. Tricuspid valve regurgitation is not demonstrated. Aortic Valve: The aortic valve is tricuspid. Aortic valve regurgitation is not visualized.  Aortic valve sclerosis is present, with no evidence of aortic valve stenosis. Aortic valve peak gradient measures 7.4 mmHg. Pulmonic Valve: The pulmonic valve was not well visualized. Pulmonic valve regurgitation is not visualized. Aorta: The aortic root and ascending aorta are structurally normal, with no evidence of dilitation. Venous: The inferior vena cava was not well visualized. IAS/Shunts: No atrial level shunt detected by color flow Doppler.  LEFT VENTRICLE PLAX 2D LVIDd:         4.79 cm      Diastology LVIDs:         3.15 cm      LV e' medial:    7.29 cm/s LV PW:         1.52 cm      LV E/e' medial:  11.9 LV IVS:        1.77 cm      LV e' lateral:   5.66 cm/s LVOT diam:     2.20 cm      LV E/e' lateral: 15.3 LV SV:         79 LV SV Index:   38 LVOT Area:     3.80 cm  LV Volumes (MOD) LV vol d, MOD A4C: 118.0 ml LV vol s, MOD A4C: 33.0 ml LV SV MOD A4C:     118.0 ml RIGHT VENTRICLE RV Basal diam:  3.35 cm RV S prime:     19.50 cm/s TAPSE (M-mode): 3.2 cm LEFT ATRIUM           Index        RIGHT ATRIUM           Index LA diam:      4.10 cm 1.94 cm/m   RA Area:     16.50 cm LA Vol (A2C): 76.1 ml 36.05 ml/m  RA Volume:   43.70 ml  20.70 ml/m LA Vol (A4C): 26.9 ml 12.74 ml/m  AORTIC VALVE                 PULMONIC VALVE AV Area (Vmax): 2.74 cm     PV Vmax:       1.06 m/s AV Vmax:        136.00 cm/s  PV Peak grad:  4.5 mmHg AV Peak Grad:   7.4 mmHg LVOT Vmax:      97.90 cm/s LVOT Vmean:     60.500 cm/s LVOT VTI:       0.209 m  AORTA Ao Root diam: 3.40 cm Ao Asc diam:  3.60 cm MITRAL VALVE MV Area (PHT): 3.05 cm    SHUNTS MV Decel Time: 249 msec    Systemic VTI:  0.21 m MV E velocity: 86.50 cm/s  Systemic Diam: 2.20 cm MV A velocity: 79.30 cm/s MV E/A ratio:  1.09 Kate Sable MD Electronically signed by Kate Sable MD Signature Date/Time: 06/24/2022/10:00:41 AM    Final    CT HEAD WO CONTRAST (5MM)  Result Date: 06/23/2022 CLINICAL DATA:  56 year old male with hypertensive emergency. Chest  pain and shortness of breath. Cocaine. EXAM: CT HEAD WITHOUT CONTRAST TECHNIQUE: Contiguous axial images were obtained from the base of the skull through the vertex without intravenous contrast. RADIATION DOSE REDUCTION: This exam was performed according to the departmental  dose-optimization program which includes automated exposure control, adjustment of the mA and/or kV according to patient size and/or use of iterative reconstruction technique. COMPARISON:  Head CT 11/25/2021. 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 Chronic encephalomalacia in the nearby anterior right temporal lobe. 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 Chronic left frontal lobe white matter hypodensity which might be a previous EVD tract. Stable cerebral volume. No midline shift, ventriculomegaly, mass effect, evidence of mass lesion, intracranial hemorrhage or evidence of cortically based acute infarction. Gray-white matter differentiation is within normal limits throughout the brain. Vascular: Calcified atherosclerosis at the skull base. 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 No suspicious intracranial vascular hyperdensity. Skull: Previous right frontotemporal craniotomy is stable. Previous left vertex burr hole is stable. No acute osseous abnormality identified. Sinuses/Orbits: Bilateral maxillary sinus fluid levels. But minor paranasal sinus mucosal thickening elsewhere. Tympanic cavities and mastoids are clear. Other: Chronic scalp soft tissue scarring. No acute orbit or scalp soft tissue finding. IMPRESSION: 1. No acute intracranial abnormality. 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 Chronic right temporal lobe encephalomalacia. Chronic small vessel disease. 2. Bilateral maxillary sinus fluid levels raising the possibility of acute sinusitis.  Electronically Signed   By: Genevie Ann M.D.   On: 06/23/2022 10:26    Cardiac Studies   TTE 06/23/2022: 1. Left ventricular ejection fraction, by estimation, is 60 to 65%. The  left ventricle has normal function. The left ventricle has no regional  wall motion abnormalities. There is mild left ventricular hypertrophy.  Left ventricular diastolic parameters  were normal.   2. Right ventricular systolic function is normal. The right ventricular  size is normal.   3. The mitral valve is normal in structure. No evidence of mitral valve  regurgitation.   4. The aortic valve is tricuspid. Aortic valve regurgitation is not  visualized. Aortic valve sclerosis is present, with no evidence of aortic  valve stenosis.   Patient Profile     56 y.o. male with history of hypertension, diabetes, current smoker x 30+ years, COPD, CKD 5, cocaine use, subarachnoid hemorrhage s/p craniotomy and clipping 2020 who presents with worsening shortness of breath, atypical chest pain and lower extremity edema, being seen for edema and chest pain.  Assessment & Plan    HFpEF, shortness of breath, lower extremity edema -CKD possibly contributing to edema -Volume overloaded, -3.2 L since admission, -1.1 in the last 24 hrs -Creatinine increased from 5.32 yesterday to 5.72 today -Continue Lasix as per nephrology recs   2.  Hypertension -BP still elevated, limited d/t kidney fxn -Continue hydralazine 100 mg TID -Continue Coreg, amlodipine, Lasix   3.  Atypical Chest pain -Troponin elevations (30, 27, 25, 24) -History of chronic atypical chest pain -Plan for Lexiscan Myoview today    4.  COPD -Shortness of breath improved -management as per medicine team   5.  CKD 5 -Nephrology following -Management as per nephrology   6.  Current smoker -On nicotine patch -Cessation recommended   7. Hyperlipidemia - LDL 137 - Continue Zetia and Fenofibrate - Hx of Statin intolerance     For questions or updates,  please contact [facility] Please consult [email or web address] for contact info under      Signed, Jabre Heo, NP  06/25/2022, 8:20 AM

## 2022-06-25 NOTE — TOC CM/SW Note (Signed)
Went by room for readmission prevention screen but patient is off the unit. Will try again later as time allows.  Dayton Scrape, [place]

## 2022-06-25 NOTE — Progress Notes (Signed)
Hospital owned CPAP in room. Patient and RN instructed to contact respiratory when patient is ready to go to sleep so RRT can place patient on CPAP.

## 2022-06-26 ENCOUNTER — Inpatient Hospital Stay: Payer: Medicare Other

## 2022-06-26 DIAGNOSIS — I5023 Acute on chronic systolic (congestive) heart failure: Secondary | ICD-10-CM | POA: Diagnosis not present

## 2022-06-26 DIAGNOSIS — I509 Heart failure, unspecified: Secondary | ICD-10-CM | POA: Diagnosis not present

## 2022-06-26 DIAGNOSIS — N185 Chronic kidney disease, stage 5: Secondary | ICD-10-CM | POA: Diagnosis not present

## 2022-06-26 DIAGNOSIS — J9601 Acute respiratory failure with hypoxia: Secondary | ICD-10-CM | POA: Diagnosis not present

## 2022-06-26 LAB — BASIC METABOLIC PANEL
Anion gap: 7 (ref 5–15)
BUN: 99 mg/dL — ABNORMAL HIGH (ref 6–20)
CO2: 23 mmol/L (ref 22–32)
Calcium: 7.9 mg/dL — ABNORMAL LOW (ref 8.9–10.3)
Chloride: 111 mmol/L (ref 98–111)
Creatinine, Ser: 6 mg/dL — ABNORMAL HIGH (ref 0.61–1.24)
GFR, Estimated: 10 mL/min — ABNORMAL LOW (ref >60)
Glucose, Bld: 94 mg/dL (ref 70–99)
Potassium: 3.6 mmol/L (ref 3.5–5.1)
Sodium: 141 mmol/L (ref 135–145)

## 2022-06-26 LAB — NM MYOCAR MULTI W/SPECT W/WALL MOTION / EF
LV dias vol: 142 mL (ref 62–150)
LV sys vol: 84 mL
Nuc Stress EF: 41 %
Peak HR: 71 {beats}/min
Percent HR: 43 %
Rest HR: 61 {beats}/min
Rest Nuclear Isotope Dose: 10.8 mCi
SDS: 0
SRS: 0
SSS: 3
Stress Nuclear Isotope Dose: 30 mCi
TID: 0.99

## 2022-06-26 LAB — HEPATITIS B CORE ANTIBODY, TOTAL: Hep B Core Total Ab: NONREACTIVE

## 2022-06-26 LAB — HEPATITIS B SURFACE ANTIGEN: Hepatitis B Surface Ag: NONREACTIVE

## 2022-06-26 MED ORDER — GUAIFENESIN-DM 100-10 MG/5ML PO SYRP
ORAL_SOLUTION | ORAL | Status: AC
  Filled 2022-06-26: qty 10

## 2022-06-26 MED ORDER — GUAIFENESIN-DM 100-10 MG/5ML PO SYRP
5.0000 mL | ORAL_SOLUTION | ORAL | Status: DC | PRN
  Administered 2022-06-26 (×2): 5 mL via ORAL
  Filled 2022-06-26: qty 10

## 2022-06-26 MED ORDER — REGADENOSON 0.4 MG/5ML IV SOLN
0.4000 mg | Freq: Once | INTRAVENOUS | Status: AC
  Administered 2022-06-26: 0.4 mg via INTRAVENOUS
  Filled 2022-06-26: qty 5

## 2022-06-26 MED ORDER — TECHNETIUM TC 99M TETROFOSMIN IV KIT
29.9800 | PACK | Freq: Once | INTRAVENOUS | Status: AC | PRN
  Administered 2022-06-26: 29.98 via INTRAVENOUS

## 2022-06-26 NOTE — Discharge Summary (Signed)
Physician Discharge Summary   Patient: Derek Cooley. MRN: [id number] DOB: 1966-08-08  Admit date:     06/23/2022  Discharge date: 06/26/22  Discharge Physician: Sharen Hones   PCP: Jon Billings, NP   Recommendations at discharge:   Follow-up with PCP in 1 week. Follow-up with nephrology in 1 week.  Discharge Diagnoses: Principal Problem:   Acute respiratory failure with hypoxia (HCC) Active Problems:   CKD (chronic kidney disease), stage V (HCC)   Fluid overload   COPD exacerbation (HCC)   Malignant hypertension   Chest pain   HLD (hyperlipidemia)   Substance abuse (HCC)   Tobacco abuse   Type II diabetes mellitus with renal manifestations (HCC)   Depression with anxiety   Anemia in CKD (chronic kidney disease)   Acute on chronic diastolic CHF (congestive heart failure) (HCC)   Acute on chronic HFrEF (heart failure with reduced ejection fraction) ([place])  Resolved Problems:   * No resolved hospital problems. *  Hospital Course: Derek Lara. is a 56 y.o. 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 Lucky Cowboy), HTN, HLD, DM, COPD, asthma, depression with anxity, anemia, tobacco abuse, polysubstance abuse including cocaine, OSA on CPAP, mild cognitive impairment, dCHF, brain aneurysm, pancreatitis, SAH, vitreous hemorrhage, who presents with shortness breath and chest pain. Upon arrival to the emergency room, his blood pressure was 227/107, given IV hydralazine and nitroglycerin.  Patient also developed significant hypoxemia with oxygen saturation 85%.  He has a profound elevation of BNP, chest x-ray showed vascular congestion with interstitial edema.  Patient was diagnosed with acute on chronic congestive heart failure, was giving IV Lasix. 7/17.  Not able to complete a stress test, rescheduled for tomorrow.  Change diuretics to torsemide 40 mg daily.  Assessment and Plan: Acute on chronic diastolic congestive heart  failure with volume overload. Minimal elevation of troponin. Coronary artery disease. Condition has improved, short of breath has resolved.  CT scan performed today did not show any evidence of volume overload.  Discontinue any diuretics. Patient had a nuclear stress test, was deemed to be lower risk for cardiac event.  Did show right upper lobe uptake, but CT chest did not show any abnormality in the right upper lobe.  At this point, he is medically stable to be discharged.   COPD exacerbation. Doing well, no bronchospasm.   Malignant hypertension. Resume home medicines.  Blood pressure is better   Type 2 diabetes. Condition stable.  Chronic kidney disease stage V. Mild metabolic acidosis Anemia of chronic kidney disease. Hemoglobin stable. Patient renal function deteriorating gradually, nephrology is scheduling patient to have outpatient dialysis the next few days.         Consultants: Nephrology and cardiology. Procedures performed: Stress test  Disposition: Home Diet recommendation:  Discharge Diet Orders (From admission, onward)     Start     Ordered   06/26/22 0000  Diet general       Comments: Renal diet   06/26/22 1521   06/25/22 0000  Diet general       Comments: Renal diet   06/25/22 1038           Renal diet DISCHARGE MEDICATION: Allergies as of 06/26/2022       Reactions   Atorvastatin    Joint Aches - Severe Joint Aches - Severe Joint Aches - Severe   Avelox [moxifloxacin Hcl In Nacl]    Muscle pain   Buprenorphine  Mouth sores, confusion, shaking   Dilaudid [hydromorphone Hcl] Hives   Fluoxetine Itching   Levofloxacin Other (See Comments)   Joint Pain   Morphine And Related    Other    Muscle pain   Suboxone [buprenorphine Hcl-naloxone Hcl] Other (See Comments)   Rash and confused   Vancomycin    Renal insufficiency        Medication List     STOP taking these medications    diclofenac Sodium 1 % Gel Commonly known as:  VOLTAREN   doxycycline 100 MG capsule Commonly known as: VIBRAMYCIN   fenofibrate micronized 67 MG capsule Commonly known as: LOFIBRA   gabapentin 100 MG capsule Commonly known as: NEURONTIN   HYDROcodone-acetaminophen 5-325 MG tablet Commonly known as: NORCO/VICODIN   lidocaine 5 % Commonly known as: Lidoderm       TAKE these medications    albuterol 108 (90 Base) MCG/ACT inhaler Commonly known as: VENTOLIN HFA Inhale 1-2 puffs into the lungs every 6 (six) hours as needed for wheezing or shortness of breath.   amLODipine 10 MG tablet Commonly known as: NORVASC Take 1 tablet (10 mg total) by mouth daily.   aspirin EC 81 MG tablet Take 1 tablet (81 mg total) by mouth daily. Swallow whole.   budesonide-formoterol 160-4.5 MCG/ACT inhaler Commonly known as: Symbicort Inhale 2 puffs into the lungs in the morning and at bedtime.   calcitRIOL 0.25 MCG capsule Commonly known as: ROCALTROL Take 0.25 mcg by mouth daily.   carvedilol 25 MG tablet Commonly known as: COREG Take 1 tablet (25 mg total) by mouth 2 (two) times daily with a meal.   DULoxetine 60 MG capsule Commonly known as: CYMBALTA Take 1 capsule (60 mg total) by mouth daily.   ezetimibe 10 MG tablet Commonly known as: ZETIA Take 1 tablet (10 mg total) by mouth daily.   furosemide 40 MG tablet Commonly known as: LASIX Take 1 tablet (40 mg total) by mouth daily.   hydrALAZINE 100 MG tablet Commonly known as: APRESOLINE Take 1 tablet (100 mg total) by mouth 2 (two) times daily.   hydrOXYzine 50 MG tablet Commonly known as: ATARAX TAKE ONE TABLET 3 TIMES DAILY AS NEEDED   OneTouch Ultra test strip Generic drug: glucose blood USE TO TEST BLOOD SUGAR DAILY   pantoprazole 40 MG tablet Commonly known as: PROTONIX Take 1 tablet (40 mg total) by mouth 2 (two) times daily as needed.   patiromer 8.4 g packet Commonly known as: VELTASSA Take 1 packet (8.4 g total) by mouth every Monday, Wednesday, and  Friday. What changed: when to take this   pregabalin 50 MG capsule Commonly known as: LYRICA Take 1 capsule (50 mg total) by mouth 2 (two) times daily. What changed: when to take this   sodium bicarbonate 650 MG tablet Take 1 tablet (650 mg total) by mouth 3 (three) times daily.   topiramate 50 MG tablet Commonly known as: TOPAMAX Take 1 tablet (50 mg total) by mouth 2 (two) times daily.   traZODone 100 MG tablet Commonly known as: DESYREL Take 100 mg by mouth at bedtime.        Follow-up Information     Jon Billings, NP Follow up in 1 week(s).   Specialty: Nurse Practitioner Contact information: [place] [address] [phone]         Wellington Hampshire, MD Follow up in 2 week(s).   Specialty: Cardiology Contact information: [place]  [place] [address] [phone]         Murlean Iba, MD Follow up in 1 week(s).   Specialty: Nephrology Contact information: [place] [address] [phone]                Discharge Exam: Danley Danker Weights   06/25/22 0403 06/25/22 1443 06/26/22 0500  Weight: 102.3 kg 98.3 kg 99.5 kg   General exam: Appears calm and comfortable  Respiratory system: Clear to auscultation. Respiratory effort normal. Cardiovascular system: S1 & S2 heard, RRR. No JVD, murmurs, rubs, gallops or clicks. No pedal edema. Gastrointestinal system: Abdomen is nondistended, soft and nontender. No organomegaly or masses felt. Normal bowel sounds heard. Central nervous system: Alert and oriented. No focal neurological deficits. Extremities: Symmetric 5 x 5 power. Skin: No rashes, lesions or ulcers Psychiatry: Judgement and insight appear normal. Mood & affect appropriate.    Condition at discharge: good  The results of significant diagnostics from this hospitalization (including imaging, microbiology, ancillary and laboratory) are listed below for reference.    Imaging Studies: CT CHEST WO CONTRAST  Result Date: 06/26/2022 CLINICAL DATA:  Pneumonia EXAM: CT CHEST WITHOUT CONTRAST TECHNIQUE: Multidetector CT imaging of the chest was performed following the standard protocol without IV contrast. RADIATION DOSE REDUCTION: This exam was performed according to the departmental dose-optimization program which includes automated exposure control, adjustment of the mA and/or kV according to patient size and/or use of iterative reconstruction technique. COMPARISON:  06/22/2022 FINDINGS: Cardiovascular: Unenhanced imaging the heart demonstrates no cardiomegaly or pericardial effusion. Normal caliber of the thoracic aorta. Atherosclerosis of the aorta and coronary vasculature. Evaluation of the vascular lumen is limited without IV contrast. Mediastinum/Nodes: No enlarged mediastinal or axillary lymph nodes. Thyroid gland, trachea, and esophagus demonstrate no significant findings. Lungs/Pleura: Upper lobe predominant emphysema. No acute airspace disease, effusion, or pneumothorax. Resolution of the vascular congestion and interstitial edema seen previously. Central airways are patent. Upper Abdomen: No acute abnormality. Musculoskeletal: No acute or destructive bony lesions. Reconstructed images demonstrate no additional findings. IMPRESSION: 1. No acute intrathoracic process. Resolution of the vascular congestion and interstitial edema seen previously. 2. Aortic Atherosclerosis (ICD10-I70.0) and Emphysema (ICD10-J43.9). Electronically Signed   By: Randa Ngo M.D.   On: 06/26/2022 15:16   NM Myocar Multi W/Spect W/Wall Motion / EF  Result Date: 06/26/2022   Low risk, probably normal pharmacologic myocardial perfusion stress test.   There is no evidence of significant ischemia or scar, though evaluation of the inferior wall is quite limited due to extracardiac activity and diaphragmatic attenuation.   Left ventricular systolic function is normal by visual estimation.   Calculated LVEF or 40-45% is likely artifactually low due to significant extracardiac activity.   Mild coronary artery calcification is noted on the attenuation correction CT.   Faint hazy opacity is noted in the right upper lobe adjacent to the major fissure of uncertain clinical significance.  Dedicated chest CT could be obtained for further evaluation, as clinically indicated.   ECHOCARDIOGRAM COMPLETE  Result Date: 06/24/2022    ECHOCARDIOGRAM REPORT   Patient Name:   Derek Cooley. Date of Exam: 06/23/2022 Medical Rec #:  [id number]          Height:       67.5 in Accession #:    [id number]         Weight:       218.4 lb Date of Birth:  [birth year]         BSA:  2.111 m Patient Age:    31 years           BP:           149/82 mmHg Patient Gender: M                  HR:           71 bpm. Exam Location:  [facility] Procedure: 2D Echo Indications:     Chest Pain R07.9  History:         Patient has prior history of Echocardiogram examinations, most                  recent 11/27/2021.  Sonographer:     Kathlen Brunswick RDCS Referring Phys:  [id number] Kate Sable Diagnosing Phys: Kate Sable MD  Sonographer Comments: Technically difficult study due to poor echo windows. IMPRESSIONS  1. Left ventricular ejection fraction, by estimation, is 60 to 65%. The left ventricle has normal function. The left ventricle has no regional wall motion abnormalities. There is mild left ventricular hypertrophy. Left ventricular diastolic parameters were normal.  2. Right ventricular systolic function is normal. The right ventricular size is normal.  3. The mitral valve is normal in structure. No evidence of mitral valve regurgitation.  4. The aortic valve is tricuspid. Aortic valve regurgitation is not visualized. Aortic valve sclerosis is present, with no evidence of aortic valve stenosis. FINDINGS  Left Ventricle: Left ventricular ejection fraction, by estimation, is 60 to 65%. The left ventricle has normal function.  The left ventricle has no regional wall motion abnormalities. The left ventricular internal cavity size was normal in size. There is  mild left ventricular hypertrophy. Left ventricular diastolic parameters were normal. Right Ventricle: The right ventricular size is normal. No increase in right ventricular wall thickness. Right ventricular systolic function is normal. Left Atrium: Left atrial size was normal in size. Right Atrium: Right atrial size was normal in size. Pericardium: There is no evidence of pericardial effusion. Mitral Valve: The mitral valve is normal in structure. No evidence of mitral valve regurgitation. Tricuspid Valve: The tricuspid valve is not well visualized. Tricuspid valve regurgitation is not demonstrated. Aortic Valve: The aortic valve is tricuspid. Aortic valve regurgitation is not visualized. Aortic valve sclerosis is present, with no evidence of aortic valve stenosis. Aortic valve peak gradient measures 7.4 mmHg. Pulmonic Valve: The pulmonic valve was not well visualized. Pulmonic valve regurgitation is not visualized. Aorta: The aortic root and ascending aorta are structurally normal, with no evidence of dilitation. Venous: The inferior vena cava was not well visualized. IAS/Shunts: No atrial level shunt detected by color flow Doppler.  LEFT VENTRICLE PLAX 2D LVIDd:         4.79 cm      Diastology LVIDs:         3.15 cm      LV e' medial:    7.29 cm/s LV PW:         1.52 cm      LV E/e' medial:  11.9 LV IVS:        1.77 cm      LV e' lateral:   5.66 cm/s LVOT diam:     2.20 cm      LV E/e' lateral: 15.3 LV SV:         79 LV SV Index:   38 LVOT Area:     3.80 cm  LV Volumes (MOD) LV vol d, MOD A4C: 118.0 ml LV vol s,  MOD A4C: 33.0 ml LV SV MOD A4C:     118.0 ml RIGHT VENTRICLE RV Basal diam:  3.35 cm RV S prime:     19.50 cm/s TAPSE (M-mode): 3.2 cm LEFT ATRIUM           Index        RIGHT ATRIUM           Index LA diam:      4.10 cm 1.94 cm/m   RA Area:     16.50 cm LA Vol (A2C):  76.1 ml 36.05 ml/m  RA Volume:   43.70 ml  20.70 ml/m LA Vol (A4C): 26.9 ml 12.74 ml/m  AORTIC VALVE                 PULMONIC VALVE AV Area (Vmax): 2.74 cm     PV Vmax:       1.06 m/s AV Vmax:        136.00 cm/s  PV Peak grad:  4.5 mmHg AV Peak Grad:   7.4 mmHg LVOT Vmax:      97.90 cm/s LVOT Vmean:     60.500 cm/s LVOT VTI:       0.209 m  AORTA Ao Root diam: 3.40 cm Ao Asc diam:  3.60 cm MITRAL VALVE MV Area (PHT): 3.05 cm    SHUNTS MV Decel Time: 249 msec    Systemic VTI:  0.21 m MV E velocity: 86.50 cm/s  Systemic Diam: 2.20 cm MV A velocity: 79.30 cm/s MV E/A ratio:  1.09 Kate Sable MD Electronically signed by Kate Sable MD Signature Date/Time: 06/24/2022/10:00:41 AM    Final    CT HEAD WO CONTRAST (5MM)  Result Date: 06/23/2022 CLINICAL DATA:  55 year old male with hypertensive emergency. Chest pain and shortness of breath. Cocaine. EXAM: CT HEAD WITHOUT CONTRAST TECHNIQUE: Contiguous axial images were obtained from the base of the skull through the vertex without intravenous contrast. RADIATION DOSE REDUCTION: This exam was performed according to the departmental dose-optimization program which includes automated exposure control, adjustment of the mA and/or kV according to patient size and/or use of iterative reconstruction technique. COMPARISON:  Head CT 11/25/2021. 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 Chronic encephalomalacia in the nearby anterior right temporal lobe. 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 Chronic left frontal lobe white matter hypodensity which might be a previous EVD tract. Stable cerebral volume. No midline shift, ventriculomegaly, mass effect, evidence of mass lesion, intracranial hemorrhage or evidence of cortically based acute infarction. Gray-white matter differentiation is within normal limits throughout the brain. Vascular: Calcified atherosclerosis at the skull base. 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 No suspicious intracranial vascular hyperdensity. Skull: Previous right frontotemporal craniotomy is stable. Previous left vertex burr hole is stable. No acute osseous abnormality identified. Sinuses/Orbits: Bilateral maxillary sinus fluid levels. But minor paranasal sinus mucosal thickening elsewhere. Tympanic cavities and mastoids are clear. Other: Chronic scalp soft tissue scarring. No acute orbit or scalp soft tissue finding. IMPRESSION: 1. No acute intracranial abnormality. 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 Chronic right temporal lobe encephalomalacia. Chronic small vessel disease. 2. Bilateral maxillary sinus fluid levels raising the possibility of acute sinusitis. Electronically Signed   By: Genevie Ann M.D.   On: 06/23/2022 10:26   DG Chest Portable 1 View  Result Date: 06/22/2022 CLINICAL DATA:  Chest pain with shortness of breath. EXAM: PORTABLE CHEST 1 VIEW  COMPARISON:  Portable chest 01/22/2022. FINDINGS: The heart silhouette is increased in size compared to the prior study. There is increased perihilar vascular congestion and development of interstitial edema in the perihilar areas and bases with small pleural effusions also beginning to form. No focal airspace infiltrate is seen. The lungs are otherwise clear. The mediastinum is normally outlined. There is moderate bilateral shoulder DJD. IMPRESSION: Cardiomegaly with vascular congestion and interstitial edema consistent with CHF or fluid overload. Small pleural effusions are forming but no focal infiltrate is seen. Progress chest films recommended depending on clinical response. Electronically Signed   By: Telford Nab M.D.   On: 06/22/2022 22:27    Microbiology: Results for orders placed or performed during the hospital encounter of 01/22/22  Resp Panel by RT-PCR (Flu A&B, Covid) Nasopharyngeal Swab      Status: None   Collection Time: 01/23/22 12:06 AM   Specimen: Nasopharyngeal Swab; Nasopharyngeal(NP) swabs in vial transport medium  Result Value Ref Range Status   SARS Coronavirus 2 by RT PCR NEGATIVE NEGATIVE Final    Comment: (NOTE) SARS-CoV-2 target nucleic acids are NOT DETECTED.  The SARS-CoV-2 RNA is generally detectable in upper respiratory specimens during the acute phase of infection. The lowest concentration of SARS-CoV-2 viral copies this assay can detect is 138 copies/mL. A negative result does not preclude SARS-Cov-2 infection and should not be used as the sole basis for treatment or other patient management decisions. A negative result may occur with  improper specimen collection/handling, submission of specimen other than nasopharyngeal swab, presence of viral mutation(s) within the areas targeted by this assay, and inadequate number of viral copies(<138 copies/mL). A negative result must be combined with clinical observations, patient history, and epidemiological information. The expected result is Negative.  Fact Sheet for Patients:  EntrepreneurPulse.com.au  Fact Sheet for Healthcare Providers:  IncredibleEmployment.be  This test is no t yet approved or cleared by the [place] FDA and  has been authorized for detection and/or diagnosis of SARS-CoV-2 by FDA under an Emergency Use Authorization (EUA). This EUA will remain  in effect (meaning this test can be used) for the duration of the COVID-19 declaration under Section 564(b)(1) of the Act, 21 U.S.C.section 360bbb-3(b)(1), unless the authorization is terminated  or revoked sooner.       Influenza A by PCR NEGATIVE NEGATIVE Final   Influenza B by PCR NEGATIVE NEGATIVE Final    Comment: (NOTE) The Xpert Xpress SARS-CoV-2/FLU/RSV plus assay is intended as an aid in the diagnosis of influenza from Nasopharyngeal swab specimens and should not be used as a sole basis  for treatment. Nasal washings and aspirates are unacceptable for Xpert Xpress SARS-CoV-2/FLU/RSV testing.  Fact Sheet for Patients: EntrepreneurPulse.com.au  Fact Sheet for Healthcare Providers: IncredibleEmployment.be  This test is not yet approved or cleared by the [place] FDA and has been authorized for detection and/or diagnosis of SARS-CoV-2 by FDA under an Emergency Use Authorization (EUA). This EUA will remain in effect (meaning this test can be used) for the duration of the COVID-19 declaration under Section 564(b)(1) of the Act, 21 U.S.C. section 360bbb-3(b)(1), unless the authorization is terminated or revoked.  Performed at [facility], [address] [place], [place] [address]     Labs: CBC: Recent Labs  Lab 06/22/22 2209 06/23/22 0807 06/24/22 0609  WBC 9.0 8.5 9.7  NEUTROABS 5.9  --   --   HGB 10.3* 10.2* 10.3*  HCT 32.4* 31.3* 31.3*  MCV 92.8 90.5 89.2  PLT 229  236 833   Basic Metabolic Panel: Recent Labs  Lab 06/22/22 2209 06/23/22 0807 06/24/22 0609 06/25/22 0445 06/26/22 0923  NA 142 140 143 140 141  K 4.0 3.9 4.0 3.8 3.6  CL 111 114* 112* 111 111  CO2 23 21* 21* 20* 23  GLUCOSE 144* 96 139* 135* 94  BUN 55* 58* 74* 93* 99*  CREATININE 4.37* 4.37* 5.32* 5.72* 6.00*  CALCIUM 8.6* 8.5* 8.5* 8.3* 7.9*  MG  --   --   --  2.4  --    Liver Function Tests: Recent Labs  Lab 06/22/22 2209  AST 18  ALT 14  ALKPHOS 75  BILITOT 0.6  PROT 6.9  ALBUMIN 3.9   CBG: Recent Labs  Lab 06/24/22 0815  GLUCAP 174*    Discharge time spent: greater than 30 minutes.  Signed: Sharen Hones, MD [facility] 06/26/2022

## 2022-06-26 NOTE — Progress Notes (Signed)
Nephrology now wants to keep patient for dialysis, will cancel discharge

## 2022-06-26 NOTE — TOC Transition Note (Signed)
Transition of Care [facility]) - CM/SW Discharge Note   Patient Details  Name: Derek Cooley. MRN: [id number] Date of Birth: May 25, 1966  [facility] CM/SW Contact:  Candie Chroman, LCSW Phone Number: 06/26/2022, 3:23 PM   Clinical Narrative:   Patient has orders to discharge home today. No further concerns. CSW signing off.  Final next level of care: Home/Self Care Barriers to Discharge: Barriers Resolved   Patient Goals and CMS Choice        Discharge Placement                Patient to be transferred to facility by: Sister   Patient and family notified of of transfer: 06/26/22  Discharge Plan and Services     Post Acute Care Choice: NA                               Social Determinants of Health (SDOH) Interventions     Readmission Risk Interventions    06/25/2022    3:34 PM  Readmission Risk Prevention Plan  Transportation Screening Complete  Medication Review ([place]) Complete  PCP or Specialist appointment within 3-5 days of discharge Complete  SW Recovery Care/Counseling Consult Complete  [place] Not Applicable

## 2022-06-26 NOTE — Progress Notes (Signed)
[facility], [place] 06/26/22  Subjective:   Hospital day # 3  Patient seen sitting up in bed, alert and oriented Complaining of right knee pain from an old injury Appetite poor, denies nausea and vomiting.  States food taste appropriately.  States low appetite. Remains on room air No lower extremity edema    Renal: 07/17 0701 - 07/18 0700 In: 540 [P.O.:540] Out: 650 [Urine:650] Lab Results  Component Value Date   CREATININE 6.00 (H) 06/26/2022   CREATININE 5.72 (H) 06/25/2022   CREATININE 5.32 (H) 06/24/2022    Urine output 667ml  recorded in previous 24 hours.   Objective:  Vital signs in last 24 hours:  Temp:  [97.1 F (36.2 C)-98.7 F (37.1 C)] 97.7 F (36.5 C) (07/18 1457) Pulse Rate:  [62-72] 65 (07/18 1457) Resp:  [16-22] 20 (07/18 1457) BP: (109-134)/(44-74) 111/58 (07/18 1457) SpO2:  [95 %-100 %] 100 % (07/18 1457) Weight:  [99.5 kg] 99.5 kg (07/18 0500)  Weight change: -4.006 kg Filed Weights   06/25/22 0403 06/25/22 1443 06/26/22 0500  Weight: 102.3 kg 98.3 kg 99.5 kg    Intake/Output:    Intake/Output Summary (Last 24 hours) at 06/26/2022 1748 Last data filed at 06/26/2022 1400 Gross per 24 hour  Intake 300 ml  Output 775 ml  Net -475 ml      Physical Exam: General: NAD, laying in the bed  HEENT Anicteric, moist oral mucous membranes  Pulm/lungs Normal breathing effort, [place] O2, clear  CVS/Heart No rub, soft systolic murmur  Abdomen:  Obese, soft, nontender  Extremities: Trace edema bilaterally  Neurologic: Alert, oriented.  Able to follow commands.  Skin: Warm, dry.  No acute rashes.  Access: Developing left arm AV fistula.  Good thrill.       Basic Metabolic Panel:  Recent Labs  Lab 06/22/22 2209 06/23/22 0807 06/24/22 0609 06/25/22 0445 06/26/22 0923  NA 142 140 143 140 141  K 4.0 3.9 4.0 3.8 3.6  CL 111 114* 112* 111 111  CO2 23 21* 21* 20* 23  GLUCOSE 144* 96 139* 135* 94  BUN 55* 58* 74*  93* 99*  CREATININE 4.37* 4.37* 5.32* 5.72* 6.00*  CALCIUM 8.6* 8.5* 8.5* 8.3* 7.9*  MG  --   --   --  2.4  --       CBC: Recent Labs  Lab 06/22/22 2209 06/23/22 0807 06/24/22 0609  WBC 9.0 8.5 9.7  NEUTROABS 5.9  --   --   HGB 10.3* 10.2* 10.3*  HCT 32.4* 31.3* 31.3*  MCV 92.8 90.5 89.2  PLT 229 236 222      No results found for: "HEPBSAG", "HEPBSAB", "HEPBIGM"    Microbiology:  No results found for this or any previous visit (from the past 240 hour(s)).  Coagulation Studies: No results for input(s): "LABPROT", "INR" in the last 72 hours.  Urinalysis: No results for input(s): "COLORURINE", "LABSPEC", "PHURINE", "GLUCOSEU", "HGBUR", "BILIRUBINUR", "KETONESUR", "PROTEINUR", "UROBILINOGEN", "NITRITE", "LEUKOCYTESUR" in the last 72 hours.  Invalid input(s): "APPERANCEUR"    Imaging: CT CHEST WO CONTRAST  Result Date: 06/26/2022 CLINICAL DATA:  Pneumonia EXAM: CT CHEST WITHOUT CONTRAST TECHNIQUE: Multidetector CT imaging of the chest was performed following the standard protocol without IV contrast. RADIATION DOSE REDUCTION: This exam was performed according to the departmental dose-optimization program which includes automated exposure control, adjustment of the mA and/or kV according to patient size and/or use of iterative reconstruction technique. COMPARISON:  06/22/2022 FINDINGS: Cardiovascular: Unenhanced imaging the heart demonstrates  no cardiomegaly or pericardial effusion. Normal caliber of the thoracic aorta. Atherosclerosis of the aorta and coronary vasculature. Evaluation of the vascular lumen is limited without IV contrast. Mediastinum/Nodes: No enlarged mediastinal or axillary lymph nodes. Thyroid gland, trachea, and esophagus demonstrate no significant findings. Lungs/Pleura: Upper lobe predominant emphysema. No acute airspace disease, effusion, or pneumothorax. Resolution of the vascular congestion and interstitial edema seen previously. Central airways are  patent. Upper Abdomen: No acute abnormality. Musculoskeletal: No acute or destructive bony lesions. Reconstructed images demonstrate no additional findings. IMPRESSION: 1. No acute intrathoracic process. Resolution of the vascular congestion and interstitial edema seen previously. 2. Aortic Atherosclerosis (ICD10-I70.0) and Emphysema (ICD10-J43.9). Electronically Signed   By: Randa Ngo M.D.   On: 06/26/2022 15:16   NM Myocar Multi W/Spect W/Wall Motion / EF  Result Date: 06/26/2022   Low risk, probably normal pharmacologic myocardial perfusion stress test.   There is no evidence of significant ischemia or scar, though evaluation of the inferior wall is quite limited due to extracardiac activity and diaphragmatic attenuation.   Left ventricular systolic function is normal by visual estimation.  Calculated LVEF or 40-45% is likely artifactually low due to significant extracardiac activity.   Mild coronary artery calcification is noted on the attenuation correction CT.   Faint hazy opacity is noted in the right upper lobe adjacent to the major fissure of uncertain clinical significance.  Dedicated chest CT could be obtained for further evaluation, as clinically indicated.     Medications:     amLODipine  10 mg Oral Daily   aspirin EC  81 mg Oral Daily   calcitRIOL  0.25 mcg Oral Daily   carvedilol  25 mg Oral BID WC   DULoxetine  60 mg Oral Daily   ezetimibe  10 mg Oral Daily   guaiFENesin  600 mg Oral BID   heparin  5,000 Units Subcutaneous Q8H   hydrALAZINE  100 mg Oral TID   losartan  50 mg Oral Daily   mometasone-formoterol  2 puff Inhalation BID   pantoprazole  40 mg Oral QHS   pregabalin  50 mg Oral Daily   sodium bicarbonate  650 mg Oral TID   topiramate  50 mg Oral BID   torsemide  40 mg Oral Daily   traZODone  100 mg Oral QHS   acetaminophen **OR** acetaminophen, albuterol, guaiFENesin-dextromethorphan, hydrALAZINE, hydrOXYzine, magnesium hydroxide, nitroGLYCERIN, ondansetron  **OR** ondansetron (ZOFRAN) IV, oxyCODONE-acetaminophen, traZODone  Assessment/ Plan:  56 y.o. male with type 2 diabetes, advanced CKD from diabetic nephropathy, history of polysubstance abuse  admitted on 06/23/2022 for Chronic chest pain [R07.9, G89.29] Fluid overload [E87.70] Chronic kidney disease, unspecified CKD stage [N18.9] Acute on chronic congestive heart failure, unspecified heart failure type (HCC) [I50.9]  #Chronic kidney disease stage V in the setting of diabetic nephropathy. Outpatient creatinine of 4.36/GFR  14-15.  Creatinine continues to rise with decreased urine output.Patient alert and oriented on assessment, however family feels patient is becoming confused. Discussed with patient that we may need to initiate renal replacement therapy before fistula is matured for use.  Explained the patient that I would consult vascular surgery to place PermCath that we will be available for use and immediate dialysis. Patient states he would like to discuss this with his sister, we will follow-up with patient in the morning.  #Volume overload/malignant hypertension/lower extremity edema Presenting blood pressure of 227/107.  Current regimen includes amlodipine, carvedilol, hydralazine, Losartan and torsemide. .Blood pressure currently 111/58.   #Anemia of chronic kidney disease  Lab Results  Component Value Date   HGB 10.3 (L) 06/24/2022   Hemoglobin remains at acceptable level.  We will monitor     LOS: 3   7/18/20235:48 PM  [facility] [place], [place]

## 2022-06-26 NOTE — Progress Notes (Signed)
Progress Note  Patient Name: Derek Cooley. Date of Encounter: 06/26/2022  [facility] Cardiologist: Kathlyn Sacramento, MD   Subjective   Pt feeling okay today.  Denies chest pain and shortness of breath.  Plan for stress this morning.  Inpatient Medications    Scheduled Meds:  amLODipine  10 mg Oral Daily   aspirin EC  81 mg Oral Daily   calcitRIOL  0.25 mcg Oral Daily   carvedilol  25 mg Oral BID WC   DULoxetine  60 mg Oral Daily   ezetimibe  10 mg Oral Daily   guaiFENesin  600 mg Oral BID   heparin  5,000 Units Subcutaneous Q8H   hydrALAZINE  100 mg Oral TID   losartan  50 mg Oral Daily   mometasone-formoterol  2 puff Inhalation BID   pantoprazole  40 mg Oral QHS   pregabalin  50 mg Oral Daily   sodium bicarbonate  650 mg Oral TID   topiramate  50 mg Oral BID   torsemide  40 mg Oral Daily   traZODone  100 mg Oral QHS   Continuous Infusions:  PRN Meds: acetaminophen **OR** acetaminophen, albuterol, guaiFENesin-dextromethorphan, hydrALAZINE, hydrOXYzine, magnesium hydroxide, nitroGLYCERIN, ondansetron **OR** ondansetron (ZOFRAN) IV, oxyCODONE-acetaminophen, traZODone   Vital Signs    Vitals:   06/25/22 2356 06/26/22 0500 06/26/22 0505 06/26/22 0734  BP: 128/66  126/73 134/74  Pulse: 65  63 62  Resp: 18  18 (!) 22  Temp: 97.9 F (36.6 C)  98.7 F (37.1 C) 98.4 F (36.9 C)  TempSrc:    Oral  SpO2: 100%  97% 95%  Weight:  99.5 kg    Height:        Intake/Output Summary (Last 24 hours) at 06/26/2022 0907 Last data filed at 06/26/2022 0230 Gross per 24 hour  Intake 540 ml  Output 650 ml  Net -110 ml      06/26/2022    5:00 AM 06/25/2022    2:43 PM 06/25/2022    4:03 AM  Last 3 Weights  Weight (lbs) 219 lb 5.7 oz 216 lb 11.2 oz 225 lb 8.5 oz  Weight (kg) 99.5 kg 98.294 kg 102.3 kg      Telemetry    NSR, rate in 60s - Personally Reviewed  ECG    No new readings - Personally Reviewed  Physical Exam   GEN: No acute distress.   Neck: No  JVD Cardiac: RRR, no murmurs, rubs, or gallops.  Respiratory: Diminished breath sounds bilaterally, anterior wheezes. GI: Soft, nontender, non-distended  MS: +1 edema; No deformity. Neuro:  Nonfocal  Psych: Normal affect   Labs    High Sensitivity Troponin:   Recent Labs  Lab 06/22/22 2209 06/23/22 0156 06/23/22 0807 06/23/22 1350  TROPONINIHS 30* 27* 25* 24*     Chemistry Recent Labs  Lab 06/22/22 2209 06/23/22 0807 06/24/22 0609 06/25/22 0445  NA 142 140 143 140  K 4.0 3.9 4.0 3.8  CL 111 114* 112* 111  CO2 23 21* 21* 20*  GLUCOSE 144* 96 139* 135*  BUN 55* 58* 74* 93*  CREATININE 4.37* 4.37* 5.32* 5.72*  CALCIUM 8.6* 8.5* 8.5* 8.3*  MG  --   --   --  2.4  PROT 6.9  --   --   --   ALBUMIN 3.9  --   --   --   AST 18  --   --   --   ALT 14  --   --   --  ALKPHOS 75  --   --   --   BILITOT 0.6  --   --   --   GFRNONAA 15* 15* 12* 11*  ANIONGAP 8 5 10 9     Lipids  Recent Labs  Lab 06/23/22 0807  CHOL 201*  TRIG 83  HDL 47  LDLCALC 137*  CHOLHDL 4.3    Hematology Recent Labs  Lab 06/22/22 2209 06/23/22 0807 06/24/22 0609  WBC 9.0 8.5 9.7  RBC 3.49* 3.46* 3.51*  HGB 10.3* 10.2* 10.3*  HCT 32.4* 31.3* 31.3*  MCV 92.8 90.5 89.2  MCH 29.5 29.5 29.3  MCHC 31.8 32.6 32.9  RDW 13.3 13.4 13.0  PLT 229 236 222   Thyroid No results for input(s): "TSH", "FREET4" in the last 168 hours.  BNPNo results for input(s): "BNP", "PROBNP" in the last 168 hours.  DDimer No results for input(s): "DDIMER" in the last 168 hours.   Radiology    No results found.  Cardiac Studies   TTE 06/23/2022: 1. Left ventricular ejection fraction, by estimation, is 60 to 65%. The  left ventricle has normal function. The left ventricle has no regional  wall motion abnormalities. There is mild left ventricular hypertrophy.  Left ventricular diastolic parameters  were normal.   2. Right ventricular systolic function is normal. The right ventricular  size is normal.   3. The  mitral valve is normal in structure. No evidence of mitral valve  regurgitation.   4. The aortic valve is tricuspid. Aortic valve regurgitation is not  visualized. Aortic valve sclerosis is present, with no evidence of aortic  valve stenosis.   Patient Profile     56 y.o. male with history of hypertension, diabetes, current smoker x 30+ years, COPD, CKD 5, cocaine use, subarachnoid hemorrhage s/p craniotomy and clipping 2020 who presents with worsening shortness of breath, atypical chest pain and lower extremity edema, being seen for edema and chest pain.  Assessment & Plan    HFpEF, shortness of breath, lower extremity edema -CKD possibly contributing to edema -Volume overloaded, -3.39 L since admission, -110 ml documented in the last 24 hrs -Creatinine 5.72 yesterday, labs not drawn this morning -Iv lasix stopped yesterday, pt started on Torsemide 40 mg daily today   2.  Hypertension -BP stable this morning -Continue hydralazine 100 mg TID -Continue Coreg, amlodipine, Torsemide   3.  Atypical Chest pain -Troponin elevations (30, 27, 25, 24) -History of chronic atypical chest pain -Plan for [facility] today as it was not completed yesterday d/t IV loss and HTN   4.  COPD -Shortness of breath improved -management as per medicine team   5.  CKD 5 -Nephrology following -Management as per nephrology   6.  Current smoker -On nicotine patch -Cessation recommended   7. Hyperlipidemia - LDL 137 not at goal - Continue Zetia and Fenofibrate - Hx of Statin intolerance - can consider injectables       For questions or updates, please contact [facility] Please consult [email or web address] for contact info under      Signed, Axie Hayne, NP  06/26/2022, 9:07 AM

## 2022-06-26 NOTE — Consult Note (Signed)
   Heart Failure Nurse Navigator Note  HFpEF EF 60 to 65%.  Mild LVH.  He presented from home with complaints of shortness of breath.  Also noted to have chest pain.  Comorbidities:  Chronic kidney disease stage V Hypertension Hyperlipidemia Diabetes Depression/anxiety Asthma COPD Tobacco abuse Previous substance abuse Obstructive sleep apnea/CPAP  Medications:  Amlodipine 10 mg daily Enteric-coated aspirin 81 mg daily Carvedilol 25 mg 2 times a day with meals Zetia 10 mg daily Hydralazine 100 mg 3 times a day Losartan 50 mg daily Torsemide 40 mg daily  Labs:  Sodium 140, potassium 3.8, chloride 111, CO2 20, BUN 93, creatinine 5.72 from 5.32 of yesterday. Blood pressure is 134/74 Weight 99.5 kg Intake 540 mL Output 650 mL   Initial meeting with patient who was lying in bed in no acute distress on room air.  He had just returned from his [place].  He states that he has heard the term heart failure before but not in regards to himself.  He felt that heart failure meant that his heart was quitting.  Given the true meaning of heart failure.  He voices understanding.   He states that he lives at home with his sister, she helps with setting up his medications and also prepares the meals.  He states that they do eat foods that are higher in sodium such as canned vegetables, soup and prepared meats.  Discussed making wiser choices of low sodium foods.  He is unsure about the amount of liquids that he drinks throughout the day.  Discussed limiting to no more than 64 ounces in a days time.  He voices understanding.  He states that he has a scale but does not weigh himself daily, discussed getting into a routine of daily weight and recording and what to report.  Also went over changes in symptoms to report.  He was given living with heart failure teaching booklet, zone magnet, info on low-sodium and heart failure along with weight chart.  He had no further questions.  We  will continue to follow along.  Pricilla Riffle RN [facility]

## 2022-06-26 NOTE — Plan of Care (Signed)
  Problem: Activity: Goal: Capacity to carry out activities will improve Outcome: Progressing   Problem: Cardiac: Goal: Ability to achieve and maintain adequate cardiopulmonary perfusion will improve Outcome: Progressing   Problem: Education: Goal: Knowledge of General Education information will improve Description: Including pain rating scale, medication(s)/side effects and non-pharmacologic comfort measures Outcome: Progressing   Problem: Health Behavior/Discharge Planning: Goal: Ability to manage health-related needs will improve Outcome: Progressing   Problem: Clinical Measurements: Goal: Ability to maintain clinical measurements within normal limits will improve Outcome: Progressing Goal: Will remain free from infection Outcome: Progressing Goal: Respiratory complications will improve Outcome: Progressing Goal: Cardiovascular complication will be avoided Outcome: Progressing   Problem: Activity: Goal: Risk for activity intolerance will decrease Outcome: Progressing   Problem: Nutrition: Goal: Adequate nutrition will be maintained Outcome: Progressing   Problem: Coping: Goal: Level of anxiety will decrease Outcome: Progressing   Problem: Elimination: Goal: Will not experience complications related to bowel motility Outcome: Progressing Goal: Will not experience complications related to urinary retention Outcome: Progressing   Problem: Pain Managment: Goal: General experience of comfort will improve Outcome: Progressing   Problem: Safety: Goal: Ability to remain free from injury will improve Outcome: Progressing   Problem: Skin Integrity: Goal: Risk for impaired skin integrity will decrease Outcome: Progressing   Problem: Education: Goal: Ability to demonstrate management of disease process will improve Outcome: Not Progressing Goal: Ability to verbalize understanding of medication therapies will improve Outcome: Not Progressing   Problem: Clinical  Measurements: Goal: Diagnostic test results will improve Outcome: Not Progressing

## 2022-06-26 NOTE — Care Management Important Message (Signed)
Important Message  Patient Details  Name: Derek Cooley. MRN: [id number] Date of Birth: 10-Sep-1966   Medicare Important Message Given:  Yes  Out of room for procedure upon time of visit.  Copy of Medicare IM left on beside tray for reference.   [name] 06/26/2022, 11:38 AM

## 2022-06-27 ENCOUNTER — Encounter
Admission: EM | Disposition: A | Discharge status: Home / Self Care | Payer: Self-pay | Source: Home / Self Care | Attending: Internal Medicine

## 2022-06-27 DIAGNOSIS — E1122 Type 2 diabetes mellitus with diabetic chronic kidney disease: Secondary | ICD-10-CM

## 2022-06-27 DIAGNOSIS — E785 Hyperlipidemia, unspecified: Secondary | ICD-10-CM

## 2022-06-27 DIAGNOSIS — N186 End stage renal disease: Secondary | ICD-10-CM

## 2022-06-27 DIAGNOSIS — Z992 Dependence on renal dialysis: Secondary | ICD-10-CM

## 2022-06-27 DIAGNOSIS — Z720 Tobacco use: Secondary | ICD-10-CM | POA: Diagnosis not present

## 2022-06-27 DIAGNOSIS — J441 Chronic obstructive pulmonary disease with (acute) exacerbation: Secondary | ICD-10-CM | POA: Diagnosis not present

## 2022-06-27 DIAGNOSIS — Z95828 Presence of other vascular implants and grafts: Secondary | ICD-10-CM

## 2022-06-27 DIAGNOSIS — N185 Chronic kidney disease, stage 5: Secondary | ICD-10-CM | POA: Diagnosis not present

## 2022-06-27 DIAGNOSIS — I5033 Acute on chronic diastolic (congestive) heart failure: Secondary | ICD-10-CM | POA: Diagnosis not present

## 2022-06-27 DIAGNOSIS — I10 Essential (primary) hypertension: Secondary | ICD-10-CM | POA: Diagnosis not present

## 2022-06-27 DIAGNOSIS — F17210 Nicotine dependence, cigarettes, uncomplicated: Secondary | ICD-10-CM

## 2022-06-27 DIAGNOSIS — D631 Anemia in chronic kidney disease: Secondary | ICD-10-CM

## 2022-06-27 DIAGNOSIS — I120 Hypertensive chronic kidney disease with stage 5 chronic kidney disease or end stage renal disease: Secondary | ICD-10-CM

## 2022-06-27 DIAGNOSIS — J9601 Acute respiratory failure with hypoxia: Secondary | ICD-10-CM | POA: Diagnosis not present

## 2022-06-27 HISTORY — PX: DIALYSIS/PERMA CATHETER INSERTION: CATH118288

## 2022-06-27 LAB — GLUCOSE, CAPILLARY
Glucose-Capillary: 108 mg/dL — ABNORMAL HIGH (ref 70–99)
Glucose-Capillary: 115 mg/dL — ABNORMAL HIGH (ref 70–99)

## 2022-06-27 LAB — HEPATITIS B E ANTIBODY: Hep B E Ab: NEGATIVE

## 2022-06-27 LAB — HEPATITIS B SURFACE ANTIBODY, QUANTITATIVE: Hep B S AB Quant (Post): <3.1 m[IU]/mL — ABNORMAL LOW (ref >9.9)

## 2022-06-27 SURGERY — DIALYSIS/PERMA CATHETER INSERTION
Anesthesia: Moderate Sedation

## 2022-06-27 MED ORDER — CEFAZOLIN SODIUM-DEXTROSE 1-4 GM/50ML-% IV SOLN
1.0000 g | INTRAVENOUS | Status: AC
  Administered 2022-06-27: 1 g via INTRAVENOUS

## 2022-06-27 MED ORDER — MIDAZOLAM HCL 2 MG/2ML IJ SOLN
INTRAMUSCULAR | Status: AC
  Filled 2022-06-27: qty 2

## 2022-06-27 MED ORDER — FENTANYL CITRATE (PF) 100 MCG/2ML IJ SOLN
INTRAMUSCULAR | Status: AC
  Filled 2022-06-27: qty 2

## 2022-06-27 MED ORDER — FENTANYL CITRATE (PF) 100 MCG/2ML IJ SOLN
INTRAMUSCULAR | Status: DC | PRN
  Administered 2022-06-27: 50 ug via INTRAVENOUS

## 2022-06-27 MED ORDER — MIDAZOLAM HCL 2 MG/2ML IJ SOLN
INTRAMUSCULAR | Status: DC | PRN
  Administered 2022-06-27: 2 mg via INTRAVENOUS

## 2022-06-27 MED ORDER — CHLORHEXIDINE GLUCONATE CLOTH 2 % EX PADS
6.0000 | MEDICATED_PAD | Freq: Every day | CUTANEOUS | Status: DC
  Administered 2022-06-28 – 2022-07-02 (×5): 6 via TOPICAL

## 2022-06-27 SURGICAL SUPPLY — 13 items
CATH CANNON HEMO 15FR 19 (HEMODIALYSIS SUPPLIES) ×1 IMPLANT
COVER PROBE U/S 5X48 (MISCELLANEOUS) ×1 IMPLANT
DERMABOND ADVANCED (GAUZE/BANDAGES/DRESSINGS) ×1
DERMABOND ADVANCED .7 DNX12 (GAUZE/BANDAGES/DRESSINGS) IMPLANT
DRAPE INCISE 23X17 IOBAN STRL (DRAPES) ×1
DRAPE INCISE 23X17 STRL (DRAPES) IMPLANT
DRAPE INCISE IOBAN 23X17 STRL (DRAPES) ×1 IMPLANT
NDL ENTRY 21GA 7CM ECHOTIP (NEEDLE) IMPLANT
NEEDLE ENTRY 21GA 7CM ECHOTIP (NEEDLE) ×2 IMPLANT
PACK ANGIOGRAPHY (CUSTOM PROCEDURE TRAY) ×1 IMPLANT
SET INTRO CAPELLA COAXIAL (SET/KITS/TRAYS/PACK) ×1 IMPLANT
SUT MNCRL AB 4-0 PS2 18 (SUTURE) ×1 IMPLANT
SUT SILK 0 FSL (SUTURE) ×1 IMPLANT

## 2022-06-27 NOTE — Progress Notes (Signed)
[place], [place] 06/27/22  Subjective:   Hospital day # 4  Patient seen resting in bed, alert and oriented Remains on room air Trace lower extremity edema Patient is agreeable to proceed with plan to place PermCath and initiate dialysis.    Renal: 07/18 0701 - 07/19 0700 In: -  Out: 825 [Urine:825] Lab Results  Component Value Date   CREATININE 6.00 (H) 06/26/2022   CREATININE 5.72 (H) 06/25/2022   CREATININE 5.32 (H) 06/24/2022     Objective:  Vital signs in last 24 hours:  Temp:  [97.5 F (36.4 C)-98.5 F (36.9 C)] 97.5 F (36.4 C) (07/19 1201) Pulse Rate:  [0-65] 61 (07/19 1315) Resp:  [10-20] 20 (07/19 1311) BP: (100-139)/(47-83) 114/62 (07/19 1315) SpO2:  [91 %-100 %] 91 % (07/19 1315) Weight:  [99.5 kg] 99.5 kg (07/19 1201)  Weight change:  Filed Weights   06/25/22 1443 06/26/22 0500 06/27/22 1201  Weight: 98.3 kg 99.5 kg 99.5 kg    Intake/Output:    Intake/Output Summary (Last 24 hours) at 06/27/2022 1347 Last data filed at 06/27/2022 0900 Gross per 24 hour  Intake 240 ml  Output 1125 ml  Net -885 ml      Physical Exam: General: NAD, laying in the bed  HEENT Anicteric, moist oral mucous membranes  Pulm/lungs Normal breathing effort, clear bilaterally  CVS/Heart No rub, soft systolic murmur  Abdomen:  Obese, soft, nontender  Extremities: Trace edema bilaterally  Neurologic: Alert, oriented.  Able to follow commands.  Skin: Warm, dry.  No acute rashes.  Access: Developing left arm AV fistula.  Good thrill.       Basic Metabolic Panel:  Recent Labs  Lab 06/22/22 2209 06/23/22 0807 06/24/22 0609 06/25/22 0445 06/26/22 0923  NA 142 140 143 140 141  K 4.0 3.9 4.0 3.8 3.6  CL 111 114* 112* 111 111  CO2 23 21* 21* 20* 23  GLUCOSE 144* 96 139* 135* 94  BUN 55* 58* 74* 93* 99*  CREATININE 4.37* 4.37* 5.32* 5.72* 6.00*  CALCIUM 8.6* 8.5* 8.5* 8.3* 7.9*  MG  --   --   --  2.4  --       CBC: Recent  Labs  Lab 06/22/22 2209 06/23/22 0807 06/24/22 0609  WBC 9.0 8.5 9.7  NEUTROABS 5.9  --   --   HGB 10.3* 10.2* 10.3*  HCT 32.4* 31.3* 31.3*  MCV 92.8 90.5 89.2  PLT 229 236 222       Lab Results  Component Value Date   HEPBSAG NON REACTIVE 06/26/2022      Microbiology:  No results found for this or any previous visit (from the past 240 hour(s)).  Coagulation Studies: No results for input(s): "LABPROT", "INR" in the last 72 hours.  Urinalysis: No results for input(s): "COLORURINE", "LABSPEC", "PHURINE", "GLUCOSEU", "HGBUR", "BILIRUBINUR", "KETONESUR", "PROTEINUR", "UROBILINOGEN", "NITRITE", "LEUKOCYTESUR" in the last 72 hours.  Invalid input(s): "APPERANCEUR"    Imaging: PERIPHERAL VASCULAR CATHETERIZATION  Result Date: 06/27/2022 See surgical note for result.  CT CHEST WO CONTRAST  Result Date: 06/26/2022 CLINICAL DATA:  Pneumonia EXAM: CT CHEST WITHOUT CONTRAST TECHNIQUE: Multidetector CT imaging of the chest was performed following the standard protocol without IV contrast. RADIATION DOSE REDUCTION: This exam was performed according to the departmental dose-optimization program which includes automated exposure control, adjustment of the mA and/or kV according to patient size and/or use of iterative reconstruction technique. COMPARISON:  06/22/2022 FINDINGS: Cardiovascular: Unenhanced imaging the heart demonstrates no cardiomegaly or  pericardial effusion. Normal caliber of the thoracic aorta. Atherosclerosis of the aorta and coronary vasculature. Evaluation of the vascular lumen is limited without IV contrast. Mediastinum/Nodes: No enlarged mediastinal or axillary lymph nodes. Thyroid gland, trachea, and esophagus demonstrate no significant findings. Lungs/Pleura: Upper lobe predominant emphysema. No acute airspace disease, effusion, or pneumothorax. Resolution of the vascular congestion and interstitial edema seen previously. Central airways are patent. Upper Abdomen: No  acute abnormality. Musculoskeletal: No acute or destructive bony lesions. Reconstructed images demonstrate no additional findings. IMPRESSION: 1. No acute intrathoracic process. Resolution of the vascular congestion and interstitial edema seen previously. 2. Aortic Atherosclerosis (ICD10-I70.0) and Emphysema (ICD10-J43.9). Electronically Signed   By: Randa Ngo M.D.   On: 06/26/2022 15:16   NM Myocar Multi W/Spect W/Wall Motion / EF  Result Date: 06/26/2022   Low risk, probably normal pharmacologic myocardial perfusion stress test.   There is no evidence of significant ischemia or scar, though evaluation of the inferior wall is quite limited due to extracardiac activity and diaphragmatic attenuation.   Left ventricular systolic function is normal by visual estimation.  Calculated LVEF or 40-45% is likely artifactually low due to significant extracardiac activity.   Mild coronary artery calcification is noted on the attenuation correction CT.   Faint hazy opacity is noted in the right upper lobe adjacent to the major fissure of uncertain clinical significance.  Dedicated chest CT could be obtained for further evaluation, as clinically indicated.     Medications:     [MAR Hold] amLODipine  10 mg Oral Daily   [MAR Hold] aspirin EC  81 mg Oral Daily   [MAR Hold] calcitRIOL  0.25 mcg Oral Daily   [MAR Hold] carvedilol  25 mg Oral BID WC   [START ON 06/28/2022] Chlorhexidine Gluconate Cloth  6 each Topical Q0600   [MAR Hold] DULoxetine  60 mg Oral Daily   [MAR Hold] ezetimibe  10 mg Oral Daily   fentaNYL       [MAR Hold] guaiFENesin  600 mg Oral BID   [MAR Hold] heparin  5,000 Units Subcutaneous Q8H   [MAR Hold] hydrALAZINE  100 mg Oral TID   [MAR Hold] losartan  50 mg Oral Daily   midazolam       [MAR Hold] mometasone-formoterol  2 puff Inhalation BID   [MAR Hold] pantoprazole  40 mg Oral QHS   [MAR Hold] pregabalin  50 mg Oral Daily   [MAR Hold] sodium bicarbonate  650 mg Oral TID   [MAR  Hold] topiramate  50 mg Oral BID   [MAR Hold] torsemide  40 mg Oral Daily   [MAR Hold] traZODone  100 mg Oral QHS   [MAR Hold] acetaminophen **OR** [MAR Hold] acetaminophen, [MAR Hold] albuterol, fentaNYL, fentaNYL, [MAR Hold] guaiFENesin-dextromethorphan, [MAR Hold] hydrALAZINE, [MAR Hold] hydrOXYzine, [MAR Hold] magnesium hydroxide, midazolam, midazolam, [MAR Hold] nitroGLYCERIN, [MAR Hold] ondansetron **OR** [MAR Hold] ondansetron (ZOFRAN) IV, [MAR Hold] oxyCODONE-acetaminophen, [MAR Hold] traZODone  Assessment/ Plan:  56 y.o. male with type 2 diabetes, advanced CKD from diabetic nephropathy, history of polysubstance abuse  admitted on 06/23/2022 for Chronic chest pain [R07.9, G89.29] Fluid overload [E87.70] Chronic kidney disease, unspecified CKD stage [N18.9] Acute on chronic congestive heart failure, unspecified heart failure type (HCC) [I50.9]  #Chronic kidney disease stage V in the setting of diabetic nephropathy. Outpatient creatinine of 4.36/GFR  14-15.  Renal function continues to decline during this admission.  Discussed the need to initiate renal replacement therapy during this admission with patient yesterday.  Patient was  unable to contact sister and discussed this with family however has agreed to proceed with PermCath placement and initiation of dialysis.  Vascular surgery consulted for placement of PermCath.  Patient made n.p.o.  Once PermCath placed, will initiate first treatment of dialysis.  Renal navigator aware of patient's and will begin outpatient clinic search.  #Volume overload/malignant hypertension/lower extremity edema Presenting blood pressure of 227/107.  Current regimen includes amlodipine, carvedilol, hydralazine, Losartan and torsemide. .Blood pressure currently stable for this patient, 114/62   #Anemia of chronic kidney disease  Lab Results  Component Value Date   HGB 10.3 (L) 06/24/2022   We will obtain updated labs in the a.m.    LOS: 4    7/19/20231:47 PM  [facility] [place], [place]

## 2022-06-27 NOTE — Assessment & Plan Note (Addendum)
Patient was diuresed with IV Lasix.  Now with initiation of dialysis hopefully dialysis will manage fluid.  Continue Coreg, hydralazine, torsemide.

## 2022-06-27 NOTE — Progress Notes (Signed)
Progress Note  Patient Name: Derek Cooley. Date of Encounter: 06/27/2022  [facility] Cardiologist: Kathlyn Sacramento, MD   Subjective   Patient feeling okay today. Denies chest pain and shortness of breath.  Stable form a cardiac standpoint, staying for creatinine of 6 with plan for dialysis.  Inpatient Medications    Scheduled Meds:  amLODipine  10 mg Oral Daily   aspirin EC  81 mg Oral Daily   calcitRIOL  0.25 mcg Oral Daily   carvedilol  25 mg Oral BID WC   DULoxetine  60 mg Oral Daily   ezetimibe  10 mg Oral Daily   guaiFENesin  600 mg Oral BID   heparin  5,000 Units Subcutaneous Q8H   hydrALAZINE  100 mg Oral TID   losartan  50 mg Oral Daily   mometasone-formoterol  2 puff Inhalation BID   pantoprazole  40 mg Oral QHS   pregabalin  50 mg Oral Daily   sodium bicarbonate  650 mg Oral TID   topiramate  50 mg Oral BID   torsemide  40 mg Oral Daily   traZODone  100 mg Oral QHS   Continuous Infusions:  PRN Meds: acetaminophen **OR** acetaminophen, albuterol, guaiFENesin-dextromethorphan, hydrALAZINE, hydrOXYzine, magnesium hydroxide, nitroGLYCERIN, ondansetron **OR** ondansetron (ZOFRAN) IV, oxyCODONE-acetaminophen, traZODone   Vital Signs    Vitals:   06/26/22 1457 06/26/22 1918 06/26/22 2310 06/27/22 0528  BP: (!) 111/58 100/83 123/72 139/63  Pulse: 65 62 (!) 55 (!) 59  Resp: 20 19 18 20   Temp: 97.7 F (36.5 C) 98 F (36.7 C) 97.9 F (36.6 C) 98.5 F (36.9 C)  TempSrc: Oral Oral Oral   SpO2: 100% 97% 100% 97%  Weight:      Height:        Intake/Output Summary (Last 24 hours) at 06/27/2022 1022 Last data filed at 06/27/2022 0900 Gross per 24 hour  Intake 240 ml  Output 1125 ml  Net -885 ml      06/26/2022    5:00 AM 06/25/2022    2:43 PM 06/25/2022    4:03 AM  Last 3 Weights  Weight (lbs) 219 lb 5.7 oz 216 lb 11.2 oz 225 lb 8.5 oz  Weight (kg) 99.5 kg 98.294 kg 102.3 kg      Telemetry    None   ECG    No new tracings - Personally  Reviewed  Physical Exam   GEN: No acute distress.   Neck: No JVD Cardiac: RRR, no murmurs, rubs, or gallops.  Respiratory: diminished breath sounds bilaterally, improved anterior wheezes. GI: Soft, nontender, non-distended  MS: 1+ pertibial edema; No deformity. Neuro:  Nonfocal  Psych: Normal affect   Labs    High Sensitivity Troponin:   Recent Labs  Lab 06/22/22 2209 06/23/22 0156 06/23/22 0807 06/23/22 1350  TROPONINIHS 30* 27* 25* 24*     Chemistry Recent Labs  Lab 06/22/22 2209 06/23/22 0807 06/24/22 0609 06/25/22 0445 06/26/22 0923  NA 142   < > 143 140 141  K 4.0   < > 4.0 3.8 3.6  CL 111   < > 112* 111 111  CO2 23   < > 21* 20* 23  GLUCOSE 144*   < > 139* 135* 94  BUN 55*   < > 74* 93* 99*  CREATININE 4.37*   < > 5.32* 5.72* 6.00*  CALCIUM 8.6*   < > 8.5* 8.3* 7.9*  MG  --   --   --  2.4  --  PROT 6.9  --   --   --   --   ALBUMIN 3.9  --   --   --   --   AST 18  --   --   --   --   ALT 14  --   --   --   --   ALKPHOS 75  --   --   --   --   BILITOT 0.6  --   --   --   --   GFRNONAA 15*   < > 12* 11* 10*  ANIONGAP 8   < > 10 9 7    < > = values in this interval not displayed.    Lipids  Recent Labs  Lab 06/23/22 0807  CHOL 201*  TRIG 83  HDL 47  LDLCALC 137*  CHOLHDL 4.3    Hematology Recent Labs  Lab 06/22/22 2209 06/23/22 0807 06/24/22 0609  WBC 9.0 8.5 9.7  RBC 3.49* 3.46* 3.51*  HGB 10.3* 10.2* 10.3*  HCT 32.4* 31.3* 31.3*  MCV 92.8 90.5 89.2  MCH 29.5 29.5 29.3  MCHC 31.8 32.6 32.9  RDW 13.3 13.4 13.0  PLT 229 236 222   Thyroid No results for input(s): "TSH", "FREET4" in the last 168 hours.  BNPNo results for input(s): "BNP", "PROBNP" in the last 168 hours.  DDimer No results for input(s): "DDIMER" in the last 168 hours.   Radiology    CT CHEST WO CONTRAST  Result Date: 06/26/2022 CLINICAL DATA:  Pneumonia EXAM: CT CHEST WITHOUT CONTRAST TECHNIQUE: Multidetector CT imaging of the chest was performed following the standard  protocol without IV contrast. RADIATION DOSE REDUCTION: This exam was performed according to the departmental dose-optimization program which includes automated exposure control, adjustment of the mA and/or kV according to patient size and/or use of iterative reconstruction technique. COMPARISON:  06/22/2022 FINDINGS: Cardiovascular: Unenhanced imaging the heart demonstrates no cardiomegaly or pericardial effusion. Normal caliber of the thoracic aorta. Atherosclerosis of the aorta and coronary vasculature. Evaluation of the vascular lumen is limited without IV contrast. Mediastinum/Nodes: No enlarged mediastinal or axillary lymph nodes. Thyroid gland, trachea, and esophagus demonstrate no significant findings. Lungs/Pleura: Upper lobe predominant emphysema. No acute airspace disease, effusion, or pneumothorax. Resolution of the vascular congestion and interstitial edema seen previously. Central airways are patent. Upper Abdomen: No acute abnormality. Musculoskeletal: No acute or destructive bony lesions. Reconstructed images demonstrate no additional findings. IMPRESSION: 1. No acute intrathoracic process. Resolution of the vascular congestion and interstitial edema seen previously. 2. Aortic Atherosclerosis (ICD10-I70.0) and Emphysema (ICD10-J43.9). Electronically Signed   By: Randa Ngo M.D.   On: 06/26/2022 15:16   NM Myocar Multi W/Spect W/Wall Motion / EF  Result Date: 06/26/2022   Low risk, probably normal pharmacologic myocardial perfusion stress test.   There is no evidence of significant ischemia or scar, though evaluation of the inferior wall is quite limited due to extracardiac activity and diaphragmatic attenuation.   Left ventricular systolic function is normal by visual estimation.  Calculated LVEF or 40-45% is likely artifactually low due to significant extracardiac activity.   Mild coronary artery calcification is noted on the attenuation correction CT.   Faint hazy opacity is noted in the  right upper lobe adjacent to the major fissure of uncertain clinical significance.  Dedicated chest CT could be obtained for further evaluation, as clinically indicated.    Cardiac Studies   TTE 06/23/2022: 1. Left ventricular ejection fraction, by estimation, is 60 to 65%. The  left ventricle has normal function. The left ventricle has no regional  wall motion abnormalities. There is mild left ventricular hypertrophy.  Left ventricular diastolic parameters  were normal.   2. Right ventricular systolic function is normal. The right ventricular  size is normal.   3. The mitral valve is normal in structure. No evidence of mitral valve  regurgitation.   4. The aortic valve is tricuspid. Aortic valve regurgitation is not  visualized. Aortic valve sclerosis is present, with no evidence of aortic  valve stenosis.   Myoview Stress Test 06/26/22:   There is no evidence of significant ischemia or scar, though evaluation of the inferior wall is quite limited due to extracardiac activity and diaphragmatic attenuation.   Left ventricular systolic function is normal by visual estimation.  Calculated LVEF or 40-45% is likely artifactually low due to significant extracardiac activity.   Mild coronary artery calcification is noted on the attenuation correction CT.   Faint hazy opacity is noted in the right upper lobe adjacent to the major fissure of uncertain clinical significance.  Dedicated chest CT could be obtained for further evaluation, as clinically indicated.  Patient Profile     56 y.o. male with history of hypertension, diabetes, current smoker x 30+ years, COPD, CKD 5, cocaine use, subarachnoid hemorrhage s/p craniotomy and clipping 2020 who presents with worsening shortness of breath, atypical chest pain and lower extremity edema, being seen for edema and chest pain.  Assessment & Plan    HFpEF, shortness of breath, lower extremity edema -CKD possibly contributing to edema -Volume  overloaded, -4.2 L since admission, -1.1 L documented in the last 24 hrs -Creatinine 6.0 yesterday, plan to start dialysis per nephrology -Torsemide 40 mg daily, diuretics per nephrology    2.  Hypertension -BP stable this morning -Continue hydralazine 100 mg TID -Continue Coreg, amlodipine, Torsemide   3.  Atypical Chest pain- resolved -Troponin elevations (30, 27, 25, 24) -History of chronic atypical chest pain -Stress normal, unrevealing   4.  COPD -Shortness of breath improved -management as per medicine team   5.  CKD 5 -Nephrology following -Management as per nephrology   6.  Current smoker -On nicotine patch -Cessation recommended   7. Hyperlipidemia - LDL 137 not at goal - Continue Zetia and Fenofibrate - Hx of Statin intolerance - can consider injectables   For questions or updates, please contact [facility] Please consult [email or web address] for contact info under      Signed, Ariane Ditullio, NP  06/27/2022, 10:22 AM

## 2022-06-27 NOTE — Progress Notes (Signed)
Hd tx of 2hrs completed. 22.4L total volume processed. No pt acute distress. Tx delayed due to pt constant movements. Report given to lashara singletary, rn. Total uf removed: 81ml Post hd weight: 96.2kg Post hd v/s:97.6 121/75(90) 60 18 95%

## 2022-06-27 NOTE — Consult Note (Signed)
@LOGO @   MRN : [id number]  Derek Cooley. is a 56 y.o. (06-03-1966) male who presents with chief complaint of check access.  History of Present Illness:   I am asked to evaluate the patient by Ms. Breeze for worsening of his renal function.  The patient has chronic renal insufficiency stage V secondary to hypertension that appears to have worsened to the extent that he will now require starting hemodialysis sooner than expected. Patient's blood pressures has been poorly controlled and there appears to be an element of noncompliance with his medications. There are significant uremic symptoms which appear not to be well tolerated at this time.  The patient notes the kidney problem has been present for a long time and has been progressively getting worse.  The patient is followed by nephrology.    The patient is status post creation of left radiocephaic arteriovenous fistula on 05/24/2022 but it has not yet matured   No recent shortening of the patient's walking distance or new symptoms consistent with claudication.  No history of rest pain symptoms. No new ulcers or wounds of the lower extremities have occurred.  The patient denies amaurosis fugax or recent TIA symptoms. There are no recent neurological changes noted. There is no history of DVT, PE or superficial thrombophlebitis. No recent episodes of angina or shortness of breath documented.    Current Meds  Medication Sig   amLODipine (NORVASC) 10 MG tablet Take 1 tablet (10 mg total) by mouth daily.   budesonide-formoterol (SYMBICORT) 160-4.5 MCG/ACT inhaler Inhale 2 puffs into the lungs in the morning and at bedtime.   calcitRIOL (ROCALTROL) 0.25 MCG capsule Take 0.25 mcg by mouth daily.   carvedilol (COREG) 25 MG tablet Take 1 tablet (25 mg total) by mouth 2 (two) times daily with a meal.   diclofenac Sodium (VOLTAREN) 1 % GEL Apply 2 g topically 4 (four) times daily.   DULoxetine (CYMBALTA) 60 MG capsule Take 1  capsule (60 mg total) by mouth daily.   fenofibrate micronized (LOFIBRA) 67 MG capsule TAKE 1 CAPSULE EVERY DAY BEFORE BREAKFAST   furosemide (LASIX) 40 MG tablet Take 1 tablet (40 mg total) by mouth daily.   gabapentin (NEURONTIN) 100 MG capsule Take 100 mg by mouth daily.   glucose blood (ONETOUCH ULTRA) test strip USE TO TEST BLOOD SUGAR DAILY   hydrALAZINE (APRESOLINE) 100 MG tablet Take 1 tablet (100 mg total) by mouth 2 (two) times daily.   hydrOXYzine (ATARAX) 50 MG tablet TAKE ONE TABLET 3 TIMES DAILY AS NEEDED   pantoprazole (PROTONIX) 40 MG tablet Take 1 tablet (40 mg total) by mouth 2 (two) times daily as needed.   pregabalin (LYRICA) 50 MG capsule Take 1 capsule (50 mg total) by mouth 2 (two) times daily. (Patient taking differently: Take 50 mg by mouth daily.)   topiramate (TOPAMAX) 50 MG tablet Take 1 tablet (50 mg total) by mouth 2 (two) times daily.   traZODone (DESYREL) 100 MG tablet Take 100 mg by mouth at bedtime.   [DISCONTINUED] patiromer (VELTASSA) 8.4 g packet Take 8.4 g by mouth daily.    Past Medical History:  Diagnosis Date   Abdominal abscess    a.) chronic MRSA infection   Acute pancreatitis    Anxiety    Asthma    Brain aneurysm    a.) 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 b.)  RIGHT crainotomy for clip ligation 05/14/2019.   Chronic back pain    Chronic heart failure with preserved ejection fraction (HFpEF) ([place])    a. 11/2021 Echo: EF 55-60%, no rwma, mod LVH, GrI DD. Nl RV size/fxn. Mildly dil LA.   CKD (chronic kidney disease), stage V (HCC)    Depression    Emphysema of lung (HCC)    Erectile dysfunction    Followed by palliative care service    GERD (gastroesophageal reflux disease)    History of methicillin resistant staphylococcus aureus (MRSA)    HLD (hyperlipidemia)    Hypertension    Mild cognitive impairment    MRSA (methicillin resistant Staphylococcus aureus)     Obesity    OSA (obstructive sleep apnea) 2013   a.) not compliant with nocturnal PAP therapy; CPAP machine "lost or stolen".   Panic disorder    Perforated bowel ([place]) 12/10/2005   Tempoary Colostomy Bag, Skin Graft for Abd wound   Polysubstance abuse ([place])    a.) ETOH, tobacco, marijuana, methamphetamines, cocaine, BZO, opioids.   Subarachnoid hemorrhage ([place]) 01/26/2010   a.) 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   T2DM (type 2 diabetes mellitus) ([place])    Tobacco abuse    Type 2 diabetes mellitus without complication, without long-term current use of insulin ([place]) 04/17/2016   Vitreous hemorrhage ([place]) 03/27/2011   Overview:  Bilateral; 01/2010, from [facility]     Past Surgical History:  Procedure Laterality Date   AV FISTULA PLACEMENT Left 05/24/2022   Procedure: ARTERIOVENOUS (AV) FISTULA CREATION ( RADIAL CEPHALIC);  Surgeon: Algernon Huxley, MD;  Location: [facility] ORS;  Service: Vascular;  Laterality: Left;   CEREBRAL ANEURYSM REPAIR Right 01/26/2010   Procedure: CEREBRAL ANEURYSM REPAIR (COIL EMBOLIZATION)   CEREBRAL ANEURYSM REPAIR Right 05/14/2019   Procedure: CRAINOTOMY FOR CEREBRAL ANEURYSM REPAIR (CLIP LIGATION)   COLON SURGERY  12/10/2005   colostomy bag placed s/p perforated bowel   COLONOSCOPY WITH PROPOFOL N/A 05/18/2020   Procedure: COLONOSCOPY WITH PROPOFOL;  Surgeon: Lin Landsman, MD;  Location: [place];  Service: Gastroenterology;  Laterality: N/A;   KNEE SURGERY Right    Perforated bowel      Social History Social History   Tobacco Use   Smoking status: Every Day    Packs/day: 0.50    Types: Cigarettes    Start date: 10/11/1984   Smokeless tobacco: Never  Vaping Use   Vaping Use: Never used  Substance Use Topics   Alcohol use: Yes   Drug use: Not Currently    Types: "Crack" cocaine, Amphetamines, Methamphetamines, Benzodiazepines, Cocaine, Marijuana, Other-see comments     Comment: opioids    Family History Family History  Problem Relation Age of Onset   Arthritis Mother    Asthma Mother    Diabetes Mother    Heart disease Mother    Hyperlipidemia Mother    Hypertension Mother    Kidney disease Mother    Thyroid disease Mother    Lung disease Mother    Anxiety disorder Mother    Depression Mother    Diabetes Father    Heart disease Father    Depression Father    Anxiety disorder Father    Arthritis Sister    Asthma Sister    Hyperlipidemia Sister    Hypertension Sister    Lung disease Sister    Anxiety disorder Sister    Depression Sister    Hyperlipidemia Brother  Hypertension Brother    Diabetes Sister    Heart disease Sister    Depression Sister    Anxiety disorder Sister    Anxiety disorder Brother    Depression Brother    Heart disease Brother     Allergies  Allergen Reactions   Atorvastatin     Joint Aches - Severe Joint Aches - Severe Joint Aches - Severe   Avelox [Moxifloxacin Hcl In Nacl]     Muscle pain   Buprenorphine     Mouth sores, confusion, shaking   Dilaudid [Hydromorphone Hcl] Hives   Fluoxetine Itching   Levofloxacin Other (See Comments)    Joint Pain   Morphine And Related    Other     Muscle pain   Suboxone [Buprenorphine Hcl-Naloxone Hcl] Other (See Comments)    Rash and confused   Vancomycin     Renal insufficiency     REVIEW OF SYSTEMS (Negative unless checked)  Constitutional: [] Weight loss  [] Fever  [] Chills Cardiac: [] Chest pain   [] Chest pressure   [] Palpitations   [] Shortness of breath when laying flat   [] Shortness of breath with exertion. Vascular:  [] Pain in legs with walking   [] Pain in legs at rest  [] History of DVT   [] Phlebitis   [] Swelling in legs   [] Varicose veins   [] Non-healing ulcers Pulmonary:   [] Uses home oxygen   [] Productive cough   [] Hemoptysis   [] Wheeze  [] COPD   [] Asthma Neurologic:  [] Dizziness   [] Seizures   [] History of stroke   [] History of TIA  [] Aphasia    [] Vissual changes   [] Weakness or numbness in arm   [] Weakness or numbness in leg Musculoskeletal:   [] Joint swelling   [] Joint pain   [] Low back pain Hematologic:  [] Easy bruising  [] Easy bleeding   [] Hypercoagulable state   [] Anemic Gastrointestinal:  [] Diarrhea   [] Vomiting  [] Gastroesophageal reflux/heartburn   [] Difficulty swallowing. Genitourinary:  [x] Chronic kidney disease   [] Difficult urination  [] Frequent urination   [] Blood in urine Skin:  [] Rashes   [] Ulcers  Psychological:  [] History of anxiety   []  History of major depression.  Physical Examination  Vitals:   06/26/22 1457 06/26/22 1918 06/26/22 2310 06/27/22 0528  BP: (!) 111/58 100/83 123/72 139/63  Pulse: 65 62 (!) 55 (!) 59  Resp: 20 19 18 20   Temp: 97.7 F (36.5 C) 98 F (36.7 C) 97.9 F (36.6 C) 98.5 F (36.9 C)  TempSrc: Oral Oral Oral   SpO2: 100% 97% 100% 97%  Weight:      Height:       Body mass index is 28.16 kg/m. Gen: WD/WN, NAD Head: [place]/AT, No temporalis wasting.  Ear/Nose/Throat: Hearing grossly intact, nares w/o erythema or drainage Eyes: PER, EOMI, sclera nonicteric.  Neck: Supple, no gross masses or lesions.  No JVD.  Pulmonary:  Good air movement, no audible wheezing, no use of accessory muscles.  Cardiac: RRR, precordium non-hyperdynamic. Vascular:   Left wrist fistula with a small area of skin breakdown at the incision fistula is pulsatile to palpation Vessel Right Left  Radial Palpable Palpable  Brachial Palpable Palpable  Gastrointestinal: soft, non-distended. No guarding/no peritoneal signs.  Musculoskeletal: M/S 5/5 throughout.  No deformity.  Neurologic: CN 2-12 intact. Pain and light touch intact in extremities.  Symmetrical.  Speech is fluent. Motor exam as listed above. Psychiatric: Judgment intact, Mood & affect appropriate for pt's clinical situation. Dermatologic: No rashes or ulcers noted.  No changes consistent with cellulitis.   CBC  Lab Results  Component Value Date    WBC 9.7 06/24/2022   HGB 10.3 (L) 06/24/2022   HCT 31.3 (L) 06/24/2022   MCV 89.2 06/24/2022   PLT 222 06/24/2022    BMET    Component Value Date/Time   NA 141 06/26/2022 0923   NA 142 02/20/2022 1501   NA 139 08/12/2014 1019   K 3.6 06/26/2022 0923   K 3.9 08/12/2014 1019   CL 111 06/26/2022 0923   CL 103 08/12/2014 1019   CO2 23 06/26/2022 0923   CO2 27 08/12/2014 1019   GLUCOSE 94 06/26/2022 0923   GLUCOSE 100 (H) 08/12/2014 1019   BUN 99 (H) 06/26/2022 0923   BUN 52 (H) 02/20/2022 1501   BUN 17 08/12/2014 1019   CREATININE 6.00 (H) 06/26/2022 0923   CREATININE 1.55 (H) 08/12/2014 1019   CALCIUM 7.9 (L) 06/26/2022 0923   CALCIUM 9.1 08/12/2014 1019   GFRNONAA 10 (L) 06/26/2022 0923   GFRNONAA 52 (L) 08/12/2014 1019   GFRAA 65 06/29/2020 1033   GFRAA >60 08/12/2014 1019   Estimated Creatinine Clearance: 17.3 mL/min (A) (by C-G formula based on SCr of 6 mg/dL (H)).  COAG No results found for: "INR", "PROTIME"  Radiology CT CHEST WO CONTRAST  Result Date: 06/26/2022 CLINICAL DATA:  Pneumonia EXAM: CT CHEST WITHOUT CONTRAST TECHNIQUE: Multidetector CT imaging of the chest was performed following the standard protocol without IV contrast. RADIATION DOSE REDUCTION: This exam was performed according to the departmental dose-optimization program which includes automated exposure control, adjustment of the mA and/or kV according to patient size and/or use of iterative reconstruction technique. COMPARISON:  06/22/2022 FINDINGS: Cardiovascular: Unenhanced imaging the heart demonstrates no cardiomegaly or pericardial effusion. Normal caliber of the thoracic aorta. Atherosclerosis of the aorta and coronary vasculature. Evaluation of the vascular lumen is limited without IV contrast. Mediastinum/Nodes: No enlarged mediastinal or axillary lymph nodes. Thyroid gland, trachea, and esophagus demonstrate no significant findings. Lungs/Pleura: Upper lobe predominant emphysema. No acute  airspace disease, effusion, or pneumothorax. Resolution of the vascular congestion and interstitial edema seen previously. Central airways are patent. Upper Abdomen: No acute abnormality. Musculoskeletal: No acute or destructive bony lesions. Reconstructed images demonstrate no additional findings. IMPRESSION: 1. No acute intrathoracic process. Resolution of the vascular congestion and interstitial edema seen previously. 2. Aortic Atherosclerosis (ICD10-I70.0) and Emphysema (ICD10-J43.9). Electronically Signed   By: Randa Ngo M.D.   On: 06/26/2022 15:16   NM Myocar Multi W/Spect W/Wall Motion / EF  Result Date: 06/26/2022   Low risk, probably normal pharmacologic myocardial perfusion stress test.   There is no evidence of significant ischemia or scar, though evaluation of the inferior wall is quite limited due to extracardiac activity and diaphragmatic attenuation.   Left ventricular systolic function is normal by visual estimation.  Calculated LVEF or 40-45% is likely artifactually low due to significant extracardiac activity.   Mild coronary artery calcification is noted on the attenuation correction CT.   Faint hazy opacity is noted in the right upper lobe adjacent to the major fissure of uncertain clinical significance.  Dedicated chest CT could be obtained for further evaluation, as clinically indicated.   ECHOCARDIOGRAM COMPLETE  Result Date: 06/24/2022    ECHOCARDIOGRAM REPORT   Patient Name:   Derek Cooley. Date of Exam: 06/23/2022 Medical Rec #:  [id number]          Height:       67.5 in Accession #:    [id number]  Weight:       218.4 lb Date of Birth:  Jan 26, 1966         BSA:          2.111 m Patient Age:    42 years           BP:           149/82 mmHg Patient Gender: M                  HR:           71 bpm. Exam Location:  [facility] Procedure: 2D Echo Indications:     Chest Pain R07.9  History:         Patient has prior history of Echocardiogram examinations, most                   recent 11/27/2021.  Sonographer:     Kathlen Brunswick RDCS Referring Phys:  [id number] Kate Sable Diagnosing Phys: Kate Sable MD  Sonographer Comments: Technically difficult study due to poor echo windows. IMPRESSIONS  1. Left ventricular ejection fraction, by estimation, is 60 to 65%. The left ventricle has normal function. The left ventricle has no regional wall motion abnormalities. There is mild left ventricular hypertrophy. Left ventricular diastolic parameters were normal.  2. Right ventricular systolic function is normal. The right ventricular size is normal.  3. The mitral valve is normal in structure. No evidence of mitral valve regurgitation.  4. The aortic valve is tricuspid. Aortic valve regurgitation is not visualized. Aortic valve sclerosis is present, with no evidence of aortic valve stenosis. FINDINGS  Left Ventricle: Left ventricular ejection fraction, by estimation, is 60 to 65%. The left ventricle has normal function. The left ventricle has no regional wall motion abnormalities. The left ventricular internal cavity size was normal in size. There is  mild left ventricular hypertrophy. Left ventricular diastolic parameters were normal. Right Ventricle: The right ventricular size is normal. No increase in right ventricular wall thickness. Right ventricular systolic function is normal. Left Atrium: Left atrial size was normal in size. Right Atrium: Right atrial size was normal in size. Pericardium: There is no evidence of pericardial effusion. Mitral Valve: The mitral valve is normal in structure. No evidence of mitral valve regurgitation. Tricuspid Valve: The tricuspid valve is not well visualized. Tricuspid valve regurgitation is not demonstrated. Aortic Valve: The aortic valve is tricuspid. Aortic valve regurgitation is not visualized. Aortic valve sclerosis is present, with no evidence of aortic valve stenosis. Aortic valve peak gradient measures 7.4 mmHg. Pulmonic Valve: The pulmonic  valve was not well visualized. Pulmonic valve regurgitation is not visualized. Aorta: The aortic root and ascending aorta are structurally normal, with no evidence of dilitation. Venous: The inferior vena cava was not well visualized. IAS/Shunts: No atrial level shunt detected by color flow Doppler.  LEFT VENTRICLE PLAX 2D LVIDd:         4.79 cm      Diastology LVIDs:         3.15 cm      LV e' medial:    7.29 cm/s LV PW:         1.52 cm      LV E/e' medial:  11.9 LV IVS:        1.77 cm      LV e' lateral:   5.66 cm/s LVOT diam:     2.20 cm      LV E/e' lateral: 15.3 LV SV:  79 LV SV Index:   38 LVOT Area:     3.80 cm  LV Volumes (MOD) LV vol d, MOD A4C: 118.0 ml LV vol s, MOD A4C: 33.0 ml LV SV MOD A4C:     118.0 ml RIGHT VENTRICLE RV Basal diam:  3.35 cm RV S prime:     19.50 cm/s TAPSE (M-mode): 3.2 cm LEFT ATRIUM           Index        RIGHT ATRIUM           Index LA diam:      4.10 cm 1.94 cm/m   RA Area:     16.50 cm LA Vol (A2C): 76.1 ml 36.05 ml/m  RA Volume:   43.70 ml  20.70 ml/m LA Vol (A4C): 26.9 ml 12.74 ml/m  AORTIC VALVE                 PULMONIC VALVE AV Area (Vmax): 2.74 cm     PV Vmax:       1.06 m/s AV Vmax:        136.00 cm/s  PV Peak grad:  4.5 mmHg AV Peak Grad:   7.4 mmHg LVOT Vmax:      97.90 cm/s LVOT Vmean:     60.500 cm/s LVOT VTI:       0.209 m  AORTA Ao Root diam: 3.40 cm Ao Asc diam:  3.60 cm MITRAL VALVE MV Area (PHT): 3.05 cm    SHUNTS MV Decel Time: 249 msec    Systemic VTI:  0.21 m MV E velocity: 86.50 cm/s  Systemic Diam: 2.20 cm MV A velocity: 79.30 cm/s MV E/A ratio:  1.09 Kate Sable MD Electronically signed by Kate Sable MD Signature Date/Time: 06/24/2022/10:00:41 AM    Final    CT HEAD WO CONTRAST (5MM)  Result Date: 06/23/2022 CLINICAL DATA:  56 year old male with hypertensive emergency. Chest pain and shortness of breath. Cocaine. EXAM: CT HEAD WITHOUT CONTRAST TECHNIQUE: Contiguous axial images were obtained from the base of the skull through  the vertex without intravenous contrast. RADIATION DOSE REDUCTION: This exam was performed according to the departmental dose-optimization program which includes automated exposure control, adjustment of the mA and/or kV according to patient size and/or use of iterative reconstruction technique. COMPARISON:  Head CT 11/25/2021. 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 Chronic encephalomalacia in the nearby anterior right temporal lobe. 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 Chronic left frontal lobe white matter hypodensity which might be a previous EVD tract. Stable cerebral volume. No midline shift, ventriculomegaly, mass effect, evidence of mass lesion, intracranial hemorrhage or evidence of cortically based acute infarction. Gray-white matter differentiation is within normal limits throughout the brain. Vascular: Calcified atherosclerosis at the skull base. 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 No suspicious intracranial vascular hyperdensity. Skull: Previous right frontotemporal craniotomy is stable. Previous left vertex burr hole is stable. No acute osseous abnormality identified. Sinuses/Orbits: Bilateral maxillary sinus fluid levels. But minor paranasal sinus mucosal thickening elsewhere. Tympanic cavities and mastoids are clear. Other: Chronic scalp soft tissue scarring. No acute orbit or scalp soft tissue finding. IMPRESSION: 1. No acute intracranial abnormality. 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 Chronic right temporal lobe encephalomalacia. Chronic small vessel disease. 2. Bilateral maxillary sinus fluid levels raising the possibility of acute sinusitis. Electronically Signed   By: Herminio Heads.D.  On: 06/23/2022 10:26   DG Chest Portable 1 View  Result Date: 06/22/2022 CLINICAL DATA:  Chest pain with shortness of  breath. EXAM: PORTABLE CHEST 1 VIEW COMPARISON:  Portable chest 01/22/2022. FINDINGS: The heart silhouette is increased in size compared to the prior study. There is increased perihilar vascular congestion and development of interstitial edema in the perihilar areas and bases with small pleural effusions also beginning to form. No focal airspace infiltrate is seen. The lungs are otherwise clear. The mediastinum is normally outlined. There is moderate bilateral shoulder DJD. IMPRESSION: Cardiomegaly with vascular congestion and interstitial edema consistent with CHF or fluid overload. Small pleural effusions are forming but no focal infiltrate is seen. Progress chest films recommended depending on clinical response. Electronically Signed   By: Telford Nab M.D.   On: 06/22/2022 22:27     Assessment/Plan CKD (chronic kidney disease), stage V ([place]) The patient's renal dysfunction has progressed to the point that he now requires hemodialysis.  His fistula was placed approximately 5 weeks ago and is not yet mature.  Given the urgency of this situation tunneled dialysis catheter is recommended.  Risks and benefits of been reviewed all questions been answered patient has agreed to proceed.  We will move forward with tunnel catheter placement and continue to monitor his fistula so that we can transition to his arm access when the fistula has matured   hypertension associated with diabetes ([place]) An underlying cause of his renal failure and blood pressure control important in reducing the progression of atherosclerotic disease. On appropriate oral medications.     Diabetes ([place]) blood glucose control important in reducing the progression of atherosclerotic disease. Also, involved in wound healing. On appropriate medications.     High blood cholesterol lipid control important in reducing the progression of atherosclerotic disease. Continue statin therapy     Hortencia Pilar, MD  06/27/2022 11:09  AM

## 2022-06-27 NOTE — Interval H&P Note (Signed)
History and Physical Interval Note:  06/27/2022 12:31 PM  Derek Cooley.  has presented today for surgery, with the diagnosis of ESRD.  The various methods of treatment have been discussed with the patient and family. After consideration of risks, benefits and other options for treatment, the patient has consented to  Procedure(s): DIALYSIS/PERMA CATHETER INSERTION (N/A) as a surgical intervention.  The patient's history has been reviewed, patient examined, no change in status, stable for surgery.  I have reviewed the patient's chart and labs.  Questions were answered to the patient's satisfaction.     Hortencia Pilar

## 2022-06-27 NOTE — Op Note (Signed)
OPERATIVE NOTE   PROCEDURE: Insertion of tunneled dialysis catheter right IJ approach with ultrasound and fluoroscopic guidance.  PRE-OPERATIVE DIAGNOSIS: End-stage renal disease requiring hemodialysis  POST-OPERATIVE DIAGNOSIS: Same  SURGEON: Hortencia Pilar.  ANESTHESIA: Conscious sedation was administered under my direct supervision by the interventional radiology RN. IV Versed plus fentanyl were utilized. Continuous ECG, pulse oximetry and blood pressure was monitored throughout the entire procedure. Conscious sedation was for a total of 19 minutes.  ESTIMATED BLOOD LOSS: Minimal cc  CONTRAST USED:  None  FLUOROSCOPY TIME: 0.2 minutes  INDICATIONS:   Derek Cooleyis a 56 y.o. y.o. male who presents with worsening renal function.  He is status post creation of a fistula but this has not matured.  He therefore is undergoing placement of a tunneled catheter to initiate hemodialysis.  The risks and benefits of been reviewed all questions answered patient agrees to proceed  DESCRIPTION: After obtaining full informed written consent, the patient was positioned supine. The right neck and chest wall was prepped and draped in a sterile fashion. Ultrasound was placed in a sterile sleeve. Ultrasound was utilized to identify the right internal jugular vein which is noted to be echolucent and compressible indicating patency. Image is recorded for the permanent record. Under direct ultrasound visualization a micro-needle is inserted into the vein followed by the micro-wire. Micro-sheath was then advanced and a J wire is inserted without difficulty under fluoroscopic guidance. Small counterincision was made at the wire insertion site. Dilators are passed over the wire and the tunneled dialysis catheter is fed into the central venous system without difficulty.  Under fluoroscopy the catheter tip positioned at the atrial caval junction. The catheter is then approximated to the right chest wall and  an exit site selected. 1% lidocaine is infiltrated in soft tissues at this level small incision is made and the tunneling device is then passed from the exit site to the neck counterincision. Catheter is then connected to the tunneling device and the catheter was pulled subcutaneously. It is then transected and the hub assembly connected without difficulty. Both lumens aspirate and flush easily. After verification of smooth contour with proper tip position under fluoroscopy the catheter is packed with 5000 units of heparin per lumen.  Catheter secured to the skin of the chest wall with 0 silk. A sterile dressing is applied with a Biopatch.  COMPLICATIONS: None  CONDITION: Good  Hortencia Pilar [place] [facility]. Office:  [phone]   06/27/2022,1:03 PM

## 2022-06-27 NOTE — H&P (View-Only) (Signed)
@LOGO @   MRN : [id number]  Derek Bou. is a 56 y.o. (Jan 21, 1966) male who presents with chief complaint of check access.  History of Present Illness:   I am asked to evaluate the patient by Ms. Breeze for worsening of his renal function.  The patient has chronic renal insufficiency stage V secondary to hypertension that appears to have worsened to the extent that he will now require starting hemodialysis sooner than expected. Patient's blood pressures has been poorly controlled and there appears to be an element of noncompliance with his medications. There are significant uremic symptoms which appear not to be well tolerated at this time.  The patient notes the kidney problem has been present for a long time and has been progressively getting worse.  The patient is followed by nephrology.    The patient is status post creation of left radiocephaic arteriovenous fistula on 05/24/2022 but it has not yet matured   No recent shortening of the patient's walking distance or new symptoms consistent with claudication.  No history of rest pain symptoms. No new ulcers or wounds of the lower extremities have occurred.  The patient denies amaurosis fugax or recent TIA symptoms. There are no recent neurological changes noted. There is no history of DVT, PE or superficial thrombophlebitis. No recent episodes of angina or shortness of breath documented.    Current Meds  Medication Sig   amLODipine (NORVASC) 10 MG tablet Take 1 tablet (10 mg total) by mouth daily.   budesonide-formoterol (SYMBICORT) 160-4.5 MCG/ACT inhaler Inhale 2 puffs into the lungs in the morning and at bedtime.   calcitRIOL (ROCALTROL) 0.25 MCG capsule Take 0.25 mcg by mouth daily.   carvedilol (COREG) 25 MG tablet Take 1 tablet (25 mg total) by mouth 2 (two) times daily with a meal.   diclofenac Sodium (VOLTAREN) 1 % GEL Apply 2 g topically 4 (four) times daily.   DULoxetine (CYMBALTA) 60 MG capsule Take 1  capsule (60 mg total) by mouth daily.   fenofibrate micronized (LOFIBRA) 67 MG capsule TAKE 1 CAPSULE EVERY DAY BEFORE BREAKFAST   furosemide (LASIX) 40 MG tablet Take 1 tablet (40 mg total) by mouth daily.   gabapentin (NEURONTIN) 100 MG capsule Take 100 mg by mouth daily.   glucose blood (ONETOUCH ULTRA) test strip USE TO TEST BLOOD SUGAR DAILY   hydrALAZINE (APRESOLINE) 100 MG tablet Take 1 tablet (100 mg total) by mouth 2 (two) times daily.   hydrOXYzine (ATARAX) 50 MG tablet TAKE ONE TABLET 3 TIMES DAILY AS NEEDED   pantoprazole (PROTONIX) 40 MG tablet Take 1 tablet (40 mg total) by mouth 2 (two) times daily as needed.   pregabalin (LYRICA) 50 MG capsule Take 1 capsule (50 mg total) by mouth 2 (two) times daily. (Patient taking differently: Take 50 mg by mouth daily.)   topiramate (TOPAMAX) 50 MG tablet Take 1 tablet (50 mg total) by mouth 2 (two) times daily.   traZODone (DESYREL) 100 MG tablet Take 100 mg by mouth at bedtime.   [DISCONTINUED] patiromer (VELTASSA) 8.4 g packet Take 8.4 g by mouth daily.    Past Medical History:  Diagnosis Date   Abdominal abscess    a.) chronic MRSA infection   Acute pancreatitis    Anxiety    Asthma    Brain aneurysm    a.) 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 b.)  RIGHT crainotomy for clip ligation 05/14/2019.   Chronic back pain    Chronic heart failure with preserved ejection fraction (HFpEF) ([place])    a. 11/2021 Echo: EF 55-60%, no rwma, mod LVH, GrI DD. Nl RV size/fxn. Mildly dil LA.   CKD (chronic kidney disease), stage V ([facility])    Depression    Emphysema of lung ([facility])    Erectile dysfunction    Followed by palliative care service    GERD (gastroesophageal reflux disease)    History of methicillin resistant staphylococcus aureus (MRSA)    HLD (hyperlipidemia)    Hypertension    Mild cognitive impairment    MRSA (methicillin resistant Staphylococcus aureus)     Obesity    OSA (obstructive sleep apnea) 2013   a.) not compliant with nocturnal PAP therapy; CPAP machine "lost or stolen".   Panic disorder    Perforated bowel ([place]) 12/10/2005   Tempoary Colostomy Bag, Skin Graft for Abd wound   Polysubstance abuse ([place])    a.) ETOH, tobacco, marijuana, methamphetamines, cocaine, BZO, opioids.   Subarachnoid hemorrhage ([place]) 01/26/2010   a.) 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   T2DM (type 2 diabetes mellitus) ([place])    Tobacco abuse    Type 2 diabetes mellitus without complication, without long-term current use of insulin ([place]) 04/17/2016   Vitreous hemorrhage ([place]) 03/27/2011   Overview:  Bilateral; 01/2010, from [facility]     Past Surgical History:  Procedure Laterality Date   AV FISTULA PLACEMENT Left 05/24/2022   Procedure: ARTERIOVENOUS (AV) FISTULA CREATION ( RADIAL CEPHALIC);  Surgeon: Algernon Huxley, MD;  Location: [facility] ORS;  Service: Vascular;  Laterality: Left;   CEREBRAL ANEURYSM REPAIR Right 01/26/2010   Procedure: CEREBRAL ANEURYSM REPAIR (COIL EMBOLIZATION)   CEREBRAL ANEURYSM REPAIR Right 05/14/2019   Procedure: CRAINOTOMY FOR CEREBRAL ANEURYSM REPAIR (CLIP LIGATION)   COLON SURGERY  12/10/2005   colostomy bag placed s/p perforated bowel   COLONOSCOPY WITH PROPOFOL N/A 05/18/2020   Procedure: COLONOSCOPY WITH PROPOFOL;  Surgeon: Lin Landsman, MD;  Location: [facility];  Service: Gastroenterology;  Laterality: N/A;   KNEE SURGERY Right    Perforated bowel      Social History Social History   Tobacco Use   Smoking status: Every Day    Packs/day: 0.50    Types: Cigarettes    Start date: 10/11/1984   Smokeless tobacco: Never  Vaping Use   Vaping Use: Never used  Substance Use Topics   Alcohol use: Yes   Drug use: Not Currently    Types: "Crack" cocaine, Amphetamines, Methamphetamines, Benzodiazepines, Cocaine, Marijuana, Other-see comments     Comment: opioids    Family History Family History  Problem Relation Age of Onset   Arthritis Mother    Asthma Mother    Diabetes Mother    Heart disease Mother    Hyperlipidemia Mother    Hypertension Mother    Kidney disease Mother    Thyroid disease Mother    Lung disease Mother    Anxiety disorder Mother    Depression Mother    Diabetes Father    Heart disease Father    Depression Father    Anxiety disorder Father    Arthritis Sister    Asthma Sister    Hyperlipidemia Sister    Hypertension Sister    Lung disease Sister    Anxiety disorder Sister    Depression Sister    Hyperlipidemia Brother  Hypertension Brother    Diabetes Sister    Heart disease Sister    Depression Sister    Anxiety disorder Sister    Anxiety disorder Brother    Depression Brother    Heart disease Brother     Allergies  Allergen Reactions   Atorvastatin     Joint Aches - Severe Joint Aches - Severe Joint Aches - Severe   Avelox [Moxifloxacin Hcl In Nacl]     Muscle pain   Buprenorphine     Mouth sores, confusion, shaking   Dilaudid [Hydromorphone Hcl] Hives   Fluoxetine Itching   Levofloxacin Other (See Comments)    Joint Pain   Morphine And Related    Other     Muscle pain   Suboxone [Buprenorphine Hcl-Naloxone Hcl] Other (See Comments)    Rash and confused   Vancomycin     Renal insufficiency     REVIEW OF SYSTEMS (Negative unless checked)  Constitutional: [] Weight loss  [] Fever  [] Chills Cardiac: [] Chest pain   [] Chest pressure   [] Palpitations   [] Shortness of breath when laying flat   [] Shortness of breath with exertion. Vascular:  [] Pain in legs with walking   [] Pain in legs at rest  [] History of DVT   [] Phlebitis   [] Swelling in legs   [] Varicose veins   [] Non-healing ulcers Pulmonary:   [] Uses home oxygen   [] Productive cough   [] Hemoptysis   [] Wheeze  [] COPD   [] Asthma Neurologic:  [] Dizziness   [] Seizures   [] History of stroke   [] History of TIA  [] Aphasia    [] Vissual changes   [] Weakness or numbness in arm   [] Weakness or numbness in leg Musculoskeletal:   [] Joint swelling   [] Joint pain   [] Low back pain Hematologic:  [] Easy bruising  [] Easy bleeding   [] Hypercoagulable state   [] Anemic Gastrointestinal:  [] Diarrhea   [] Vomiting  [] Gastroesophageal reflux/heartburn   [] Difficulty swallowing. Genitourinary:  [x] Chronic kidney disease   [] Difficult urination  [] Frequent urination   [] Blood in urine Skin:  [] Rashes   [] Ulcers  Psychological:  [] History of anxiety   []  History of major depression.  Physical Examination  Vitals:   06/26/22 1457 06/26/22 1918 06/26/22 2310 06/27/22 0528  BP: (!) 111/58 100/83 123/72 139/63  Pulse: 65 62 (!) 55 (!) 59  Resp: 20 19 18 20   Temp: 97.7 F (36.5 C) 98 F (36.7 C) 97.9 F (36.6 C) 98.5 F (36.9 C)  TempSrc: Oral Oral Oral   SpO2: 100% 97% 100% 97%  Weight:      Height:       Body mass index is 28.16 kg/m. Gen: WD/WN, NAD Head: [place]/AT, No temporalis wasting.  Ear/Nose/Throat: Hearing grossly intact, nares w/o erythema or drainage Eyes: PER, EOMI, sclera nonicteric.  Neck: Supple, no gross masses or lesions.  No JVD.  Pulmonary:  Good air movement, no audible wheezing, no use of accessory muscles.  Cardiac: RRR, precordium non-hyperdynamic. Vascular:   Left wrist fistula with a small area of skin breakdown at the incision fistula is pulsatile to palpation Vessel Right Left  Radial Palpable Palpable  Brachial Palpable Palpable  Gastrointestinal: soft, non-distended. No guarding/no peritoneal signs.  Musculoskeletal: M/S 5/5 throughout.  No deformity.  Neurologic: CN 2-12 intact. Pain and light touch intact in extremities.  Symmetrical.  Speech is fluent. Motor exam as listed above. Psychiatric: Judgment intact, Mood & affect appropriate for pt's clinical situation. Dermatologic: No rashes or ulcers noted.  No changes consistent with cellulitis.   CBC  Lab Results  Component Value Date    WBC 9.7 06/24/2022   HGB 10.3 (L) 06/24/2022   HCT 31.3 (L) 06/24/2022   MCV 89.2 06/24/2022   PLT 222 06/24/2022    BMET    Component Value Date/Time   NA 141 06/26/2022 0923   NA 142 02/20/2022 1501   NA 139 08/12/2014 1019   K 3.6 06/26/2022 0923   K 3.9 08/12/2014 1019   CL 111 06/26/2022 0923   CL 103 08/12/2014 1019   CO2 23 06/26/2022 0923   CO2 27 08/12/2014 1019   GLUCOSE 94 06/26/2022 0923   GLUCOSE 100 (H) 08/12/2014 1019   BUN 99 (H) 06/26/2022 0923   BUN 52 (H) 02/20/2022 1501   BUN 17 08/12/2014 1019   CREATININE 6.00 (H) 06/26/2022 0923   CREATININE 1.55 (H) 08/12/2014 1019   CALCIUM 7.9 (L) 06/26/2022 0923   CALCIUM 9.1 08/12/2014 1019   GFRNONAA 10 (L) 06/26/2022 0923   GFRNONAA 52 (L) 08/12/2014 1019   GFRAA 65 06/29/2020 1033   GFRAA >60 08/12/2014 1019   Estimated Creatinine Clearance: 17.3 mL/min (A) (by C-G formula based on SCr of 6 mg/dL (H)).  COAG No results found for: "INR", "PROTIME"  Radiology CT CHEST WO CONTRAST  Result Date: 06/26/2022 CLINICAL DATA:  Pneumonia EXAM: CT CHEST WITHOUT CONTRAST TECHNIQUE: Multidetector CT imaging of the chest was performed following the standard protocol without IV contrast. RADIATION DOSE REDUCTION: This exam was performed according to the departmental dose-optimization program which includes automated exposure control, adjustment of the mA and/or kV according to patient size and/or use of iterative reconstruction technique. COMPARISON:  06/22/2022 FINDINGS: Cardiovascular: Unenhanced imaging the heart demonstrates no cardiomegaly or pericardial effusion. Normal caliber of the thoracic aorta. Atherosclerosis of the aorta and coronary vasculature. Evaluation of the vascular lumen is limited without IV contrast. Mediastinum/Nodes: No enlarged mediastinal or axillary lymph nodes. Thyroid gland, trachea, and esophagus demonstrate no significant findings. Lungs/Pleura: Upper lobe predominant emphysema. No acute  airspace disease, effusion, or pneumothorax. Resolution of the vascular congestion and interstitial edema seen previously. Central airways are patent. Upper Abdomen: No acute abnormality. Musculoskeletal: No acute or destructive bony lesions. Reconstructed images demonstrate no additional findings. IMPRESSION: 1. No acute intrathoracic process. Resolution of the vascular congestion and interstitial edema seen previously. 2. Aortic Atherosclerosis (ICD10-I70.0) and Emphysema (ICD10-J43.9). Electronically Signed   By: Randa Ngo M.D.   On: 06/26/2022 15:16   NM Myocar Multi W/Spect W/Wall Motion / EF  Result Date: 06/26/2022   Low risk, probably normal pharmacologic myocardial perfusion stress test.   There is no evidence of significant ischemia or scar, though evaluation of the inferior wall is quite limited due to extracardiac activity and diaphragmatic attenuation.   Left ventricular systolic function is normal by visual estimation.  Calculated LVEF or 40-45% is likely artifactually low due to significant extracardiac activity.   Mild coronary artery calcification is noted on the attenuation correction CT.   Faint hazy opacity is noted in the right upper lobe adjacent to the major fissure of uncertain clinical significance.  Dedicated chest CT could be obtained for further evaluation, as clinically indicated.   ECHOCARDIOGRAM COMPLETE  Result Date: 06/24/2022    ECHOCARDIOGRAM REPORT   Patient Name:   Keahi Mccarney. Date of Exam: 06/23/2022 Medical Rec #:  [id number]          Height:       67.5 in Accession #:    [id number]  Weight:       218.4 lb Date of Birth:  12-08-1966         BSA:          2.111 m Patient Age:    89 years           BP:           149/82 mmHg Patient Gender: M                  HR:           71 bpm. Exam Location:  [facility] Procedure: 2D Echo Indications:     Chest Pain R07.9  History:         Patient has prior history of Echocardiogram examinations, most                   recent 11/27/2021.  Sonographer:     Kathlen Brunswick RDCS Referring Phys:  [id number] Kate Sable Diagnosing Phys: Kate Sable MD  Sonographer Comments: Technically difficult study due to poor echo windows. IMPRESSIONS  1. Left ventricular ejection fraction, by estimation, is 60 to 65%. The left ventricle has normal function. The left ventricle has no regional wall motion abnormalities. There is mild left ventricular hypertrophy. Left ventricular diastolic parameters were normal.  2. Right ventricular systolic function is normal. The right ventricular size is normal.  3. The mitral valve is normal in structure. No evidence of mitral valve regurgitation.  4. The aortic valve is tricuspid. Aortic valve regurgitation is not visualized. Aortic valve sclerosis is present, with no evidence of aortic valve stenosis. FINDINGS  Left Ventricle: Left ventricular ejection fraction, by estimation, is 60 to 65%. The left ventricle has normal function. The left ventricle has no regional wall motion abnormalities. The left ventricular internal cavity size was normal in size. There is  mild left ventricular hypertrophy. Left ventricular diastolic parameters were normal. Right Ventricle: The right ventricular size is normal. No increase in right ventricular wall thickness. Right ventricular systolic function is normal. Left Atrium: Left atrial size was normal in size. Right Atrium: Right atrial size was normal in size. Pericardium: There is no evidence of pericardial effusion. Mitral Valve: The mitral valve is normal in structure. No evidence of mitral valve regurgitation. Tricuspid Valve: The tricuspid valve is not well visualized. Tricuspid valve regurgitation is not demonstrated. Aortic Valve: The aortic valve is tricuspid. Aortic valve regurgitation is not visualized. Aortic valve sclerosis is present, with no evidence of aortic valve stenosis. Aortic valve peak gradient measures 7.4 mmHg. Pulmonic Valve: The pulmonic  valve was not well visualized. Pulmonic valve regurgitation is not visualized. Aorta: The aortic root and ascending aorta are structurally normal, with no evidence of dilitation. Venous: The inferior vena cava was not well visualized. IAS/Shunts: No atrial level shunt detected by color flow Doppler.  LEFT VENTRICLE PLAX 2D LVIDd:         4.79 cm      Diastology LVIDs:         3.15 cm      LV e' medial:    7.29 cm/s LV PW:         1.52 cm      LV E/e' medial:  11.9 LV IVS:        1.77 cm      LV e' lateral:   5.66 cm/s LVOT diam:     2.20 cm      LV E/e' lateral: 15.3 LV SV:  79 LV SV Index:   38 LVOT Area:     3.80 cm  LV Volumes (MOD) LV vol d, MOD A4C: 118.0 ml LV vol s, MOD A4C: 33.0 ml LV SV MOD A4C:     118.0 ml RIGHT VENTRICLE RV Basal diam:  3.35 cm RV S prime:     19.50 cm/s TAPSE (M-mode): 3.2 cm LEFT ATRIUM           Index        RIGHT ATRIUM           Index LA diam:      4.10 cm 1.94 cm/m   RA Area:     16.50 cm LA Vol (A2C): 76.1 ml 36.05 ml/m  RA Volume:   43.70 ml  20.70 ml/m LA Vol (A4C): 26.9 ml 12.74 ml/m  AORTIC VALVE                 PULMONIC VALVE AV Area (Vmax): 2.74 cm     PV Vmax:       1.06 m/s AV Vmax:        136.00 cm/s  PV Peak grad:  4.5 mmHg AV Peak Grad:   7.4 mmHg LVOT Vmax:      97.90 cm/s LVOT Vmean:     60.500 cm/s LVOT VTI:       0.209 m  AORTA Ao Root diam: 3.40 cm Ao Asc diam:  3.60 cm MITRAL VALVE MV Area (PHT): 3.05 cm    SHUNTS MV Decel Time: 249 msec    Systemic VTI:  0.21 m MV E velocity: 86.50 cm/s  Systemic Diam: 2.20 cm MV A velocity: 79.30 cm/s MV E/A ratio:  1.09 Kate Sable MD Electronically signed by Kate Sable MD Signature Date/Time: 06/24/2022/10:00:41 AM    Final    CT HEAD WO CONTRAST (5MM)  Result Date: 06/23/2022 CLINICAL DATA:  56 year old male with hypertensive emergency. Chest pain and shortness of breath. Cocaine. EXAM: CT HEAD WITHOUT CONTRAST TECHNIQUE: Contiguous axial images were obtained from the base of the skull through  the vertex without intravenous contrast. RADIATION DOSE REDUCTION: This exam was performed according to the departmental dose-optimization program which includes automated exposure control, adjustment of the mA and/or kV according to patient size and/or use of iterative reconstruction technique. COMPARISON:  Head CT 11/25/2021. 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 Chronic encephalomalacia in the nearby anterior right temporal lobe. 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 Chronic left frontal lobe white matter hypodensity which might be a previous EVD tract. Stable cerebral volume. No midline shift, ventriculomegaly, mass effect, evidence of mass lesion, intracranial hemorrhage or evidence of cortically based acute infarction. Gray-white matter differentiation is within normal limits throughout the brain. Vascular: Calcified atherosclerosis at the skull base. 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 No suspicious intracranial vascular hyperdensity. Skull: Previous right frontotemporal craniotomy is stable. Previous left vertex burr hole is stable. No acute osseous abnormality identified. Sinuses/Orbits: Bilateral maxillary sinus fluid levels. But minor paranasal sinus mucosal thickening elsewhere. Tympanic cavities and mastoids are clear. Other: Chronic scalp soft tissue scarring. No acute orbit or scalp soft tissue finding. IMPRESSION: 1. No acute intracranial abnormality. 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 Chronic right temporal lobe encephalomalacia. Chronic small vessel disease. 2. Bilateral maxillary sinus fluid levels raising the possibility of acute sinusitis. Electronically Signed   By: Herminio Heads.D.  On: 06/23/2022 10:26   DG Chest Portable 1 View  Result Date: 06/22/2022 CLINICAL DATA:  Chest pain with shortness of  breath. EXAM: PORTABLE CHEST 1 VIEW COMPARISON:  Portable chest 01/22/2022. FINDINGS: The heart silhouette is increased in size compared to the prior study. There is increased perihilar vascular congestion and development of interstitial edema in the perihilar areas and bases with small pleural effusions also beginning to form. No focal airspace infiltrate is seen. The lungs are otherwise clear. The mediastinum is normally outlined. There is moderate bilateral shoulder DJD. IMPRESSION: Cardiomegaly with vascular congestion and interstitial edema consistent with CHF or fluid overload. Small pleural effusions are forming but no focal infiltrate is seen. Progress chest films recommended depending on clinical response. Electronically Signed   By: Telford Nab M.D.   On: 06/22/2022 22:27     Assessment/Plan CKD (chronic kidney disease), stage V ([place]) The patient's renal dysfunction has progressed to the point that he now requires hemodialysis.  His fistula was placed approximately 5 weeks ago and is not yet mature.  Given the urgency of this situation tunneled dialysis catheter is recommended.  Risks and benefits of been reviewed all questions been answered patient has agreed to proceed.  We will move forward with tunnel catheter placement and continue to monitor his fistula so that we can transition to his arm access when the fistula has matured   hypertension associated with diabetes ([place]) An underlying cause of his renal failure and blood pressure control important in reducing the progression of atherosclerotic disease. On appropriate oral medications.     Diabetes ([place]) blood glucose control important in reducing the progression of atherosclerotic disease. Also, involved in wound healing. On appropriate medications.     High blood cholesterol lipid control important in reducing the progression of atherosclerotic disease. Continue statin therapy     Hortencia Pilar, MD  06/27/2022 11:09  AM

## 2022-06-27 NOTE — Plan of Care (Signed)
  Problem: Health Behavior/Discharge Planning: Goal: Ability to manage health-related needs will improve Outcome: Progressing   Problem: Clinical Measurements: Goal: Ability to maintain clinical measurements within normal limits will improve Outcome: Progressing Goal: Will remain free from infection Outcome: Progressing   Problem: Activity: Goal: Risk for activity intolerance will decrease Outcome: Progressing   Problem: Nutrition: Goal: Adequate nutrition will be maintained Outcome: Progressing   Problem: Coping: Goal: Level of anxiety will decrease Outcome: Progressing   Problem: Elimination: Goal: Will not experience complications related to bowel motility Outcome: Progressing Goal: Will not experience complications related to urinary retention Outcome: Progressing   Problem: Pain Managment: Goal: General experience of comfort will improve Outcome: Progressing   Problem: Safety: Goal: Ability to remain free from injury will improve Outcome: Progressing   Problem: Education: Goal: Knowledge of General Education information will improve Description: Including pain rating scale, medication(s)/side effects and non-pharmacologic comfort measures Outcome: Not Progressing   Problem: Clinical Measurements: Goal: Diagnostic test results will improve Outcome: Not Progressing Goal: Respiratory complications will improve Outcome: Not Progressing Goal: Cardiovascular complication will be avoided Outcome: Not Progressing

## 2022-06-27 NOTE — Progress Notes (Signed)
  Progress Note   Patient: Derek Cooley. OTL:[id number] DOB: 09-09-1966 DOA: 06/23/2022     4 DOS: the patient was seen and examined on 06/27/2022     Assessment and Plan: * CKD (chronic kidney disease), stage V (HCC) Last creatinine up at 6.0.  Nephrology asked vascular surgery to place a PermCath today.  Patient will be started on dialysis.  Will end up needing 3 inpatient dialysis sessions in an outpatient dialysis slot prior to disposition.  Acute on chronic diastolic CHF (congestive heart failure) ([place]) Patient was diuresed with IV Lasix.  Now with initiation of dialysis hopefully dialysis will manage fluid.  Continue Coreg, torsemide.  Acute respiratory failure with hypoxia (HCC) Pulse ox 81% on the day of admission.  Required oxygen during the hospital course.  Currently now off oxygen.  Malignant hypertension -Continue home amlodipine, Coreg, oral hydralazine  COPD exacerbation ([place]) Completed treatment  Chest pain Troponin only mildly elevated.    HLD (hyperlipidemia) Patient is allergic to Lipitor -on Fenofibrate  Tobacco abuse - Nicotine patch  Type II diabetes mellitus with renal manifestations ([place]) Diet-controlled diabetes.  Patient is not taking medications.  Recent A1c 5.3, well controlled.  Depression with anxiety Continue home medications  Anemia in CKD (chronic kidney disease) Last hemoglobin 10.3        Subjective: Patient feels okay.  Offers no complaints.  States his appetite is good.  No nausea or vomiting.  Nephrology wants to initiate dialysis while here in the hospital.  PermCath placed today.  Physical Exam: Vitals:   06/27/22 1256 06/27/22 1301 06/27/22 1311 06/27/22 1315  BP:  101/63 109/62 114/62  Pulse:  62 (!) 58 61  Resp:  15 20   Temp:      TempSrc:      SpO2: 96% 94%  91%  Weight:      Height:       Physical Exam HENT:     Head: Normocephalic.     Mouth/Throat:     Pharynx: No oropharyngeal exudate.  Eyes:      General: Lids are normal.     Conjunctiva/sclera: Conjunctivae normal.  Cardiovascular:     Rate and Rhythm: Normal rate and regular rhythm.     Heart sounds: Normal heart sounds, S1 normal and S2 normal.  Pulmonary:     Breath sounds: No decreased breath sounds, wheezing, rhonchi or rales.  Abdominal:     Palpations: Abdomen is soft.     Tenderness: There is no abdominal tenderness.  Musculoskeletal:     Right lower leg: No swelling.     Left lower leg: No swelling.  Skin:    General: Skin is warm.     Findings: No rash.  Neurological:     Mental Status: He is alert and oriented to person, place, and time.     Data Reviewed: Last creatinine 6.0, last hemoglobin 10.3  Family Communication: Deferred  Disposition: Status is: Inpatient Remains inpatient appropriate because: Nephrology wants to start inpatient dialysis.  Will need 3 inpatient dialysis sessions plus an outpatient dialysis slot prior to disposition. Planned Discharge Destination: Home    Time spent: 28 minutes Case discussed with nephrology  Author: Loletha Grayer, MD 06/27/2022 3:06 PM  For on call review [email or web address].

## 2022-06-28 ENCOUNTER — Encounter: Payer: Self-pay | Admitting: Vascular Surgery

## 2022-06-28 DIAGNOSIS — J9601 Acute respiratory failure with hypoxia: Secondary | ICD-10-CM | POA: Diagnosis not present

## 2022-06-28 DIAGNOSIS — I5033 Acute on chronic diastolic (congestive) heart failure: Secondary | ICD-10-CM | POA: Diagnosis not present

## 2022-06-28 DIAGNOSIS — N185 Chronic kidney disease, stage 5: Secondary | ICD-10-CM | POA: Diagnosis not present

## 2022-06-28 DIAGNOSIS — I10 Essential (primary) hypertension: Secondary | ICD-10-CM | POA: Diagnosis not present

## 2022-06-28 LAB — BASIC METABOLIC PANEL
Anion gap: 8 (ref 5–15)
BUN: 76 mg/dL — ABNORMAL HIGH (ref 6–20)
CO2: 22 mmol/L (ref 22–32)
Calcium: 8.3 mg/dL — ABNORMAL LOW (ref 8.9–10.3)
Chloride: 110 mmol/L (ref 98–111)
Creatinine, Ser: 5.12 mg/dL — ABNORMAL HIGH (ref 0.61–1.24)
GFR, Estimated: 12 mL/min — ABNORMAL LOW (ref >60)
Glucose, Bld: 98 mg/dL (ref 70–99)
Potassium: 4.3 mmol/L (ref 3.5–5.1)
Sodium: 140 mmol/L (ref 135–145)

## 2022-06-28 LAB — CBC
HCT: 29.9 % — ABNORMAL LOW (ref 39.0–52.0)
Hemoglobin: 9.9 g/dL — ABNORMAL LOW (ref 13.0–17.0)
MCH: 29.2 pg (ref 26.0–34.0)
MCHC: 33.1 g/dL (ref 30.0–36.0)
MCV: 88.2 fL (ref 80.0–100.0)
Platelets: 187 10*3/uL (ref 150–400)
RBC: 3.39 MIL/uL — ABNORMAL LOW (ref 4.22–5.81)
RDW: 13 % (ref 11.5–15.5)
WBC: 7.3 10*3/uL (ref 4.0–10.5)
nRBC: 0 % (ref 0.0–0.2)

## 2022-06-28 MED ORDER — ALTEPLASE 2 MG IJ SOLR: 2.0000 mg | Freq: Once | INTRAMUSCULAR | Status: DC | PRN

## 2022-06-28 MED ORDER — PENTAFLUOROPROP-TETRAFLUOROETH EX AERO: 1.0000 | INHALATION_SPRAY | CUTANEOUS | Status: DC | PRN

## 2022-06-28 MED ORDER — HEPARIN SODIUM (PORCINE) 1000 UNIT/ML DIALYSIS: 1000.0000 [IU] | INTRAMUSCULAR | Status: DC | PRN

## 2022-06-28 MED ORDER — LIDOCAINE HCL (PF) 1 % IJ SOLN: 5.0000 mL | INTRAMUSCULAR | Status: DC | PRN

## 2022-06-28 MED ORDER — ANTICOAGULANT SODIUM CITRATE 4% (200MG/5ML) IV SOLN: 5.0000 mL | Status: DC | PRN

## 2022-06-28 MED ORDER — LIDOCAINE-PRILOCAINE 2.5-2.5 % EX CREA: 1.0000 | TOPICAL_CREAM | CUTANEOUS | Status: DC | PRN

## 2022-06-28 NOTE — Progress Notes (Signed)
  Progress Note   Patient: Derek Cooley. TYO:[id number] DOB: July 31, 1966 DOA: 06/23/2022     5 DOS: the patient was seen and examined on 06/28/2022     Assessment and Plan: * CKD (chronic kidney disease), stage V ([place]) Patient seen after second dialysis session today.  Will need another dialysis session tomorrow and an outpatient dialysis slot prior to disposition.  PermCath placed 06/27/2022 by vascular surgery.  Acute on chronic diastolic CHF (congestive heart failure) ([place]) Patient was diuresed with IV Lasix.  Now with initiation of dialysis hopefully dialysis will manage fluid.  Continue Coreg, torsemide.  Acute respiratory failure with hypoxia (HCC) Pulse ox 81% on the day of admission.  Required oxygen during the hospital course.  Currently now off oxygen.  Malignant hypertension On admission, continue home amlodipine, Coreg, oral hydralazine  COPD exacerbation ([place]) Completed treatment  Chest pain Troponin only mildly elevated.    HLD (hyperlipidemia) Patient is allergic to Lipitor.  Currently on fenofibrate  Tobacco abuse Continue nicotine patch  Type II diabetes mellitus with renal manifestations ([place]) Diet-controlled diabetes.  Recent A1c 5.3, well controlled.  Depression with anxiety Continue home medications  Anemia in CKD (chronic kidney disease) Last hemoglobin 9.9        Subjective: Patient seen after second dialysis session.  Was a little slow with his answers but then started answering questions quicker near the end of our conversation.  Complained of a little headache and left arm pain and history of rotator cuff tear.  Initially admitted with CHF and nephrology starting dialysis.  Physical Exam: Vitals:   06/28/22 1000 06/28/22 1017 06/28/22 1106 06/28/22 1230  BP:  (!) 150/82 (!) 160/61 (!) 141/82  Pulse: 63 64 76 85  Resp: 17 17 20 18   Temp:  98.6 F (37 C) 97.9 F (36.6 C)   TempSrc:  Oral Oral   SpO2: 97% 97%  100%  Weight:       Height:       Physical Exam HENT:     Head: Normocephalic.     Mouth/Throat:     Pharynx: No oropharyngeal exudate.  Eyes:     General: Lids are normal.     Conjunctiva/sclera: Conjunctivae normal.  Cardiovascular:     Rate and Rhythm: Normal rate and regular rhythm.     Heart sounds: Normal heart sounds, S1 normal and S2 normal.  Pulmonary:     Breath sounds: No decreased breath sounds, wheezing, rhonchi or rales.  Abdominal:     Palpations: Abdomen is soft.     Tenderness: There is no abdominal tenderness.  Musculoskeletal:     Right lower leg: No swelling.     Left lower leg: No swelling.  Skin:    General: Skin is warm.     Findings: No rash.  Neurological:     Mental Status: He is alert and oriented to person, place, and time.     Data Reviewed: Hemoglobin 9.9, last creatinine 5.12   Disposition: Status is: Inpatient Remains inpatient appropriate because: Will need a third dialysis session in an outpatient dialysis slot prior to disposition Planned Discharge Destination: Home   Author: Loletha Grayer, MD 06/28/2022 1:07 PM  For on call review [email or web address].

## 2022-06-28 NOTE — Progress Notes (Signed)
Received patient in bed, alert and oriented. Informed consent signed and in chart.  Time tx completed:1015  HD treatment completed.  Patient tolerated well.  HD catheter without signs and symptoms of complications.  Patient transported back to the room, alert and orient and in no acute distress.  Report given to bedside RN. Amy Balton RN  Total UF removed: 0  Medication given: none  Post HD VS: 98.6   150/82 (102)   Hr 65   Rr 17

## 2022-06-28 NOTE — TOC Progression Note (Signed)
Transition of Care (TOC) - Progression Note    Patient Details  Name: Derek Cooley. MRN: [id number] Date of Birth: 1966-04-10  Transition of Care [facility]) CM/SW Contact  Laurena Slimmer, RN Phone Number: 06/28/2022, 3:05 PM  Clinical Narrative:    Damaris Schooner with Elvera Bicker regarding HD chair time. [facility] placement for HD chair is tomorrow.    Expected Discharge Plan: Home/Self Care Barriers to Discharge: Barriers Resolved  Expected Discharge Plan and Services Expected Discharge Plan: Home/Self Care     Post Acute Care Choice: NA Living arrangements for the past 2 months: Single Family Home Expected Discharge Date: 06/26/22                                     Social Determinants of Health (SDOH) Interventions    Readmission Risk Interventions    06/25/2022    3:34 PM  Readmission Risk Prevention Plan  Transportation Screening Complete  Medication Review ([place]) Complete  PCP or Specialist appointment within 3-5 days of discharge Complete  SW Recovery Care/Counseling Consult Complete  [place] Not Applicable

## 2022-06-28 NOTE — Progress Notes (Signed)
06/27/22 1522 06/27/22 1530 06/27/22 1600  Vitals  Temp (!) 97.5 F (36.4 C)  --   --   Temp Source Oral  --   --   BP (!) 102/55 106/61 (!) 106/58  MAP (mmHg) 69 75 73  BP Location Right Arm Right Arm Right Arm  BP Method Automatic Automatic Automatic  Patient Position (if appropriate) Lying Lying Lying  Pulse Rate (!) 55 (!) 56 (!) 56  Pulse Rate Source Monitor Monitor Monitor  ECG Heart Rate (!) 55 (!) 56 (!) 56  Resp (!) 22 16 20   During Treatment Monitoring  Blood Flow Rate (mL/min) 200 mL/min 200 mL/min 200 mL/min  Arterial Pressure (mmHg) -50 mmHg -50 mmHg -80 mmHg  Venous Pressure (mmHg) 80 mmHg 80 mmHg 110 mmHg  TMP (mmHg) 22 mmHg 22 mmHg 0 mmHg  Ultrafiltration Rate (mL/min) 300 mL/min 300 mL/min 300 mL/min  Dialysate Flow Rate (mL/min) 200 ml/min 200 ml/min 300 ml/min  HD Safety Checks Performed Yes Yes Yes  Intra-Hemodialysis Comments Tx initiated Progressing as prescribed Progressing as prescribed  Dialysis Fluid Bolus Normal Saline  --   --   Bolus Amount (mL) 300 mL  --   --     06/27/22 1630 06/27/22 1700 06/27/22 1730  Vitals  Temp  --   --   --   Temp Source  --   --   --   BP 92/61 110/68 112/63  MAP (mmHg) 71 81 78  BP Location Right Arm Right Arm Right Arm  BP Method Automatic Automatic Automatic  Patient Position (if appropriate) Lying Lying Lying  Pulse Rate (!) 52 (!) 56 (!) 53  Pulse Rate Source Monitor Monitor Monitor  ECG Heart Rate (!) 53 (!) 57 (!) 58  Resp 13 15 15   During Treatment Monitoring  Blood Flow Rate (mL/min) 200 mL/min 200 mL/min 200 mL/min  Arterial Pressure (mmHg) -120 mmHg -110 mmHg -110 mmHg  Venous Pressure (mmHg) 90 mmHg 250 mmHg 250 mmHg  TMP (mmHg) 0 mmHg 0 mmHg 0 mmHg  Ultrafiltration Rate (mL/min) 300 mL/min 300 mL/min 300 mL/min  Dialysate Flow Rate (mL/min) 300 ml/min 300 ml/min 300 ml/min  HD Safety Checks Performed Yes Yes Yes  Intra-Hemodialysis Comments Progressing as prescribed Progressing as prescribed  Progressing as prescribed  Dialysis Fluid Bolus  --   --   --   Bolus Amount (mL)  --   --   --     06/27/22 1800 06/27/22 1830 06/27/22 1842  Vitals  Temp  --   --  97.6 F (36.4 C)  Temp Source  --   --  Oral  BP (!) 108/95 118/71 131/80  MAP (mmHg) 100 84 94  BP Location Right Arm Right Arm Right Arm  BP Method Automatic Automatic Automatic  Patient Position (if appropriate) Lying Lying Lying  Pulse Rate (!) 57 (!) 59 (!) 56  Pulse Rate Source Monitor Monitor Monitor  ECG Heart Rate (!) 57 (!) 59 (!) 58  Resp 15 12 17   During Treatment Monitoring  Blood Flow Rate (mL/min) 200 mL/min 200 mL/min 200 mL/min  Arterial Pressure (mmHg) -170 mmHg -80 mmHg  --   Venous Pressure (mmHg) 250 mmHg 140 mmHg  --   TMP (mmHg) 22 mmHg 27 mmHg  --   Ultrafiltration Rate (mL/min) 300 mL/min 300 mL/min  --   Dialysate Flow Rate (mL/min) 300 ml/min 300 ml/min  --   HD Safety Checks Performed Yes Yes Yes  Intra-Hemodialysis Comments Progressing  as prescribed Progressing as prescribed Tx completed  Dialysis Fluid Bolus  --   --  Normal Saline  Bolus Amount (mL)  --   --  300 mL

## 2022-06-28 NOTE — Progress Notes (Signed)
Pre HD RN assessment 

## 2022-06-28 NOTE — Progress Notes (Signed)
[facility], [place] 06/28/22  Subjective:   Hospital day # 5  Patient seen and evaluated during dialysis   HEMODIALYSIS FLOWSHEET:  Blood Flow Rate (mL/min): 250 mL/min Arterial Pressure (mmHg): -90 mmHg Venous Pressure (mmHg): 110 mmHg TMP (mmHg): 17 mmHg Ultrafiltration Rate (mL/min): 280 mL/min Dialysate Flow Rate (mL/min): 300 ml/min Dialysis Fluid Bolus: Normal Saline Bolus Amount (mL): 300 mL  No complaints at this time Making inappropriate statements to staff   Renal: 07/19 0701 - 07/20 0700 In: 480 [P.O.:480] Out: 1620 [Urine:1620] Lab Results  Component Value Date   CREATININE 5.12 (H) 06/28/2022   CREATININE 6.00 (H) 06/26/2022   CREATININE 5.72 (H) 06/25/2022     Objective:  Vital signs in last 24 hours:  Temp:  [97.4 F (36.3 C)-98.6 F (37 C)] 97.9 F (36.6 C) (07/20 1106) Pulse Rate:  [52-85] 85 (07/20 1230) Resp:  [11-22] 18 (07/20 1230) BP: (92-163)/(55-102) 141/82 (07/20 1230) SpO2:  [91 %-100 %] 100 % (07/20 1230) Weight:  [96.2 kg-99.1 kg] 99.1 kg (07/20 0419)  Weight change:  Filed Weights   06/27/22 1201 06/27/22 1450 06/28/22 0419  Weight: 99.5 kg 96.2 kg 99.1 kg    Intake/Output:    Intake/Output Summary (Last 24 hours) at 06/28/2022 1256 Last data filed at 06/28/2022 1017 Gross per 24 hour  Intake 240 ml  Output 1920 ml  Net -1680 ml      Physical Exam: General: NAD, laying in the bed  HEENT Anicteric, moist oral mucous membranes  Pulm/lungs Normal breathing effort, clear bilaterally  CVS/Heart No rub, soft systolic murmur  Abdomen:  Obese, soft, nontender  Extremities: Trace edema bilaterally  Neurologic: Alert, oriented.  Able to follow commands.  Skin: Warm, dry.  No acute rashes.  Access: Developing left arm AV fistula.  Good thrill.       Basic Metabolic Panel:  Recent Labs  Lab 06/23/22 0807 06/24/22 0609 06/25/22 0445 06/26/22 0923 06/28/22 0538  NA 140 143 140 141 140  K  3.9 4.0 3.8 3.6 4.3  CL 114* 112* 111 111 110  CO2 21* 21* 20* 23 22  GLUCOSE 96 139* 135* 94 98  BUN 58* 74* 93* 99* 76*  CREATININE 4.37* 5.32* 5.72* 6.00* 5.12*  CALCIUM 8.5* 8.5* 8.3* 7.9* 8.3*  MG  --   --  2.4  --   --       CBC: Recent Labs  Lab 06/22/22 2209 06/23/22 0807 06/24/22 0609 06/28/22 0710  WBC 9.0 8.5 9.7 7.3  NEUTROABS 5.9  --   --   --   HGB 10.3* 10.2* 10.3* 9.9*  HCT 32.4* 31.3* 31.3* 29.9*  MCV 92.8 90.5 89.2 88.2  PLT 229 236 222 187       Lab Results  Component Value Date   HEPBSAG NON REACTIVE 06/26/2022      Microbiology:  No results found for this or any previous visit (from the past 240 hour(s)).  Coagulation Studies: No results for input(s): "LABPROT", "INR" in the last 72 hours.  Urinalysis: No results for input(s): "COLORURINE", "LABSPEC", "PHURINE", "GLUCOSEU", "HGBUR", "BILIRUBINUR", "KETONESUR", "PROTEINUR", "UROBILINOGEN", "NITRITE", "LEUKOCYTESUR" in the last 72 hours.  Invalid input(s): "APPERANCEUR"    Imaging: PERIPHERAL VASCULAR CATHETERIZATION  Result Date: 06/27/2022 See surgical note for result.  CT CHEST WO CONTRAST  Result Date: 06/26/2022 CLINICAL DATA:  Pneumonia EXAM: CT CHEST WITHOUT CONTRAST TECHNIQUE: Multidetector CT imaging of the chest was performed following the standard protocol without IV contrast. RADIATION DOSE REDUCTION:  This exam was performed according to the departmental dose-optimization program which includes automated exposure control, adjustment of the mA and/or kV according to patient size and/or use of iterative reconstruction technique. COMPARISON:  06/22/2022 FINDINGS: Cardiovascular: Unenhanced imaging the heart demonstrates no cardiomegaly or pericardial effusion. Normal caliber of the thoracic aorta. Atherosclerosis of the aorta and coronary vasculature. Evaluation of the vascular lumen is limited without IV contrast. Mediastinum/Nodes: No enlarged mediastinal or axillary lymph nodes.  Thyroid gland, trachea, and esophagus demonstrate no significant findings. Lungs/Pleura: Upper lobe predominant emphysema. No acute airspace disease, effusion, or pneumothorax. Resolution of the vascular congestion and interstitial edema seen previously. Central airways are patent. Upper Abdomen: No acute abnormality. Musculoskeletal: No acute or destructive bony lesions. Reconstructed images demonstrate no additional findings. IMPRESSION: 1. No acute intrathoracic process. Resolution of the vascular congestion and interstitial edema seen previously. 2. Aortic Atherosclerosis (ICD10-I70.0) and Emphysema (ICD10-J43.9). Electronically Signed   By: Randa Ngo M.D.   On: 06/26/2022 15:16     Medications:     amLODipine  10 mg Oral Daily   aspirin EC  81 mg Oral Daily   calcitRIOL  0.25 mcg Oral Daily   carvedilol  25 mg Oral BID WC   Chlorhexidine Gluconate Cloth  6 each Topical Q0600   DULoxetine  60 mg Oral Daily   ezetimibe  10 mg Oral Daily   guaiFENesin  600 mg Oral BID   heparin  5,000 Units Subcutaneous Q8H   hydrALAZINE  100 mg Oral TID   losartan  50 mg Oral Daily   mometasone-formoterol  2 puff Inhalation BID   pantoprazole  40 mg Oral QHS   pregabalin  50 mg Oral Daily   sodium bicarbonate  650 mg Oral TID   topiramate  50 mg Oral BID   torsemide  40 mg Oral Daily   traZODone  100 mg Oral QHS   acetaminophen **OR** acetaminophen, albuterol, guaiFENesin-dextromethorphan, hydrALAZINE, hydrOXYzine, magnesium hydroxide, nitroGLYCERIN, ondansetron **OR** ondansetron (ZOFRAN) IV, oxyCODONE-acetaminophen, traZODone  Assessment/ Plan:  56 y.o. male with type 2 diabetes, advanced CKD from diabetic nephropathy, history of polysubstance abuse  admitted on 06/23/2022 for Chronic chest pain [R07.9, G89.29] Fluid overload [E87.70] Chronic kidney disease, unspecified CKD stage [N18.9] Acute on chronic congestive heart failure, unspecified heart failure type (HCC) [I50.9]  #Chronic kidney  disease stage V in the setting of diabetic nephropathy. Outpatient creatinine of 4.36/GFR  14-15.  Appreciate vascular surgery placing permcath yesterday. First treatment completed yesterday, tolerated well. Second treatment performed today. Will perform third treatment tomorrow with patient seated in chair.   Renal navigator aware of patient's and will begin outpatient clinic search.  #Volume overload/malignant hypertension/lower extremity edema Presenting blood pressure of 227/107.  Current regimen includes amlodipine, carvedilol, hydralazine, Losartan and torsemide. .Blood pressure currently 141/82   #Anemia of chronic kidney disease  Lab Results  Component Value Date   HGB 9.9 (L) 06/28/2022   Hgb at goal    LOS: [place] 7/20/202312:56 PM  [employer], [place]

## 2022-06-28 NOTE — Progress Notes (Signed)
   06/28/22 0900 06/28/22 0915 06/28/22 0930  Vitals  BP (!) 156/81 137/77 (!) 147/82  MAP (mmHg) 105 95 102  BP Location Right Arm Right Arm Right Arm  BP Method Automatic Automatic Automatic  Patient Position (if appropriate) Lying Lying Lying  Pulse Rate 64 63 70  Pulse Rate Source Monitor Monitor Monitor  ECG Heart Rate 62 62 70  Resp 11 14 16   During Treatment Monitoring  Blood Flow Rate (mL/min) 250 mL/min 250 mL/min 250 mL/min  Arterial Pressure (mmHg) -90 mmHg -80 mmHg -90 mmHg  Venous Pressure (mmHg) 110 mmHg 100 mmHg 110 mmHg  TMP (mmHg) 17 mmHg 16 mmHg 17 mmHg  Ultrafiltration Rate (mL/min) 280 mL/min 280 mL/min 280 mL/min  Dialysate Flow Rate (mL/min) 300 ml/min 300 ml/min 300 ml/min  HD Safety Checks Performed Yes Yes Yes  Intra-Hemodialysis Comments Progressing as prescribed Progressing as prescribed Progressing as prescribed    06/28/22 1000  Vitals  BP  --   MAP (mmHg)  --   BP Location  --   BP Method  --   Patient Position (if appropriate)  --   Pulse Rate 63  Pulse Rate Source  --   ECG Heart Rate 62  Resp 17  During Treatment Monitoring  Blood Flow Rate (mL/min) 250 mL/min  Arterial Pressure (mmHg) -90 mmHg  Venous Pressure (mmHg) 110 mmHg  TMP (mmHg) 17 mmHg  Ultrafiltration Rate (mL/min) 280 mL/min  Dialysate Flow Rate (mL/min) 300 ml/min  HD Safety Checks Performed Yes  Intra-Hemodialysis Comments Tx completed

## 2022-06-28 NOTE — Progress Notes (Signed)
Pre HD info 

## 2022-06-28 NOTE — Progress Notes (Signed)
[facility] Hospital Liaison note: ? ?This patient is currently enrolled in [facility] outpatient-based Palliative Care. Will continue to follow for disposition. ? ?Please call with any outpatient palliative questions or concerns. ? ?Thank you, ?Dee Curry, LPN ?[facility] Hospital Liaison ?[phone] ?

## 2022-06-28 NOTE — Progress Notes (Signed)
   06/28/22 0730 06/28/22 0745 06/28/22 0800  Vitals  BP (!) 135/102 (!) 156/102 (!) 149/99  MAP (mmHg) 109 120 112  BP Location Right Arm  --  Right Arm  BP Method Automatic  --  Automatic  Patient Position (if appropriate) Lying  --  Lying  Pulse Rate 62 61 62  Pulse Rate Source Monitor Monitor Monitor  ECG Heart Rate 62 62 62  Resp 14 18 17   During Treatment Monitoring  Blood Flow Rate (mL/min) 250 mL/min 250 mL/min 250 mL/min  Arterial Pressure (mmHg) -70 mmHg -80 mmHg -80 mmHg  Venous Pressure (mmHg) 110 mmHg 110 mmHg 110 mmHg  TMP (mmHg) 15 mmHg 17 mmHg 17 mmHg  Ultrafiltration Rate (mL/min) 280 mL/min 280 mL/min 280 mL/min  Dialysate Flow Rate (mL/min) 300 ml/min 300 ml/min 300 ml/min  HD Safety Checks Performed Yes Yes Yes  Intra-Hemodialysis Comments Tx initiated Progressing as prescribed (ufr -240, pt watching tv, no c/o) Progressing as prescribed  Dialysis Fluid Bolus Normal Saline  --   --   Bolus Amount (mL) 300 mL  --   --   Dialysate Change 3K;2.5 Ca  --   --     06/28/22 0815 06/28/22 0830 06/28/22 0845  Vitals  BP (!) 147/88 (!) 163/80 (!) 141/89  MAP (mmHg) 107 103 104  BP Location  --  Right Arm Right Arm  BP Method  --  Automatic Automatic  Patient Position (if appropriate)  --  Lying Lying  Pulse Rate 64 68 67  Pulse Rate Source  --  Monitor Monitor  ECG Heart Rate 64 68 64  Resp (!) 21 13 14   During Treatment Monitoring  Blood Flow Rate (mL/min) 250 mL/min 250 mL/min 250 mL/min  Arterial Pressure (mmHg) -80 mmHg -80 mmHg -80 mmHg  Venous Pressure (mmHg) 100 mmHg 60 mmHg 100 mmHg  TMP (mmHg) 16 mmHg 15 mmHg 16 mmHg  Ultrafiltration Rate (mL/min) 280 mL/min 280 mL/min 280 mL/min  Dialysate Flow Rate (mL/min) 300 ml/min 300 ml/min 300 ml/min  HD Safety Checks Performed Yes Yes Yes  Intra-Hemodialysis Comments Progressing as prescribed (-210 ufr, pt watching tv, alert, no c/o) Progressing as prescribed Progressing as prescribed  Dialysis Fluid Bolus  --    --   --   Bolus Amount (mL)  --   --   --   Dialysate Change  --   --   --

## 2022-06-29 DIAGNOSIS — J441 Chronic obstructive pulmonary disease with (acute) exacerbation: Secondary | ICD-10-CM | POA: Diagnosis not present

## 2022-06-29 DIAGNOSIS — I5033 Acute on chronic diastolic (congestive) heart failure: Secondary | ICD-10-CM | POA: Diagnosis not present

## 2022-06-29 DIAGNOSIS — M545 Low back pain, unspecified: Secondary | ICD-10-CM

## 2022-06-29 DIAGNOSIS — N185 Chronic kidney disease, stage 5: Secondary | ICD-10-CM | POA: Diagnosis not present

## 2022-06-29 DIAGNOSIS — J9601 Acute respiratory failure with hypoxia: Secondary | ICD-10-CM | POA: Diagnosis not present

## 2022-06-29 MED ORDER — OXYCODONE-ACETAMINOPHEN 5-325 MG PO TABS
ORAL_TABLET | ORAL | Status: AC
  Filled 2022-06-29: qty 1

## 2022-06-29 MED ORDER — HEPARIN SODIUM (PORCINE) 1000 UNIT/ML IJ SOLN
INTRAMUSCULAR | Status: AC
  Filled 2022-06-29: qty 5

## 2022-06-29 MED ORDER — ORAL CARE MOUTH RINSE: 15.0000 mL | OROMUCOSAL | Status: DC | PRN

## 2022-06-29 NOTE — Care Management Important Message (Signed)
Important Message  Patient Details  Name: Derek Cooley. MRN: [id number] Date of Birth: Feb 15, 1966   Medicare Important Message Given:  Yes  Out of room for procedure upon time of visit.  Copy of Medicare IM left on beside tray for reference.   [name] 06/29/2022, 10:55 AM

## 2022-06-29 NOTE — Assessment & Plan Note (Signed)
Left-sided.  Needs to get out of bed and ambulate more.

## 2022-06-29 NOTE — TOC Progression Note (Signed)
Transition of Care (TOC) - Progression Note    Patient Details  Name: Derek Cooley. MRN: [id number] Date of Birth: [birth year]  Transition of Care [facility]) CM/SW Contact  Laurena Slimmer, RN Phone Number: 06/29/2022, 3:32 PM  Clinical Narrative:    Attempt to reach dialysis coordinator regarding HD schedule for outpatient. No answer.    Expected Discharge Plan: Home/Self Care Barriers to Discharge: Barriers Resolved  Expected Discharge Plan and Services Expected Discharge Plan: Home/Self Care     Post Acute Care Choice: NA Living arrangements for the past 2 months: Single Family Home Expected Discharge Date: 06/26/22                                     Social Determinants of Health (SDOH) Interventions    Readmission Risk Interventions    06/25/2022    3:34 PM  Readmission Risk Prevention Plan  Transportation Screening Complete  Medication Review ([place]) Complete  PCP or Specialist appointment within 3-5 days of discharge Complete  SW Recovery Care/Counseling Consult Complete  [place] Not Applicable

## 2022-06-29 NOTE — Progress Notes (Signed)
  Progress Note   Patient: Derek Cooley. NLZ:[id number] DOB: 1966/02/16 DOA: 06/23/2022     6 DOS: the patient was seen and examined on 06/29/2022     Assessment and Plan: * CKD (chronic kidney disease), stage V ([place]) Patient seen during third dialysis session today.  Will need an outpatient dialysis slot prior to disposition.  PermCath placed 06/27/2022 by vascular surgery.  Acute on chronic diastolic CHF (congestive heart failure) ([place]) Patient was diuresed with IV Lasix.  Now with initiation of dialysis hopefully dialysis will manage fluid.  Continue Coreg, torsemide.  Acute respiratory failure with hypoxia (HCC) Pulse ox 81% on the day of admission.  Required oxygen during the hospital course.  Currently now off oxygen.  Malignant hypertension On admission, continue home amlodipine, Coreg, oral hydralazine  COPD exacerbation ([place]) Completed treatment  Chest pain Troponin only mildly elevated.    HLD (hyperlipidemia) Patient is allergic to Lipitor.  Currently on fenofibrate  Tobacco abuse Continue nicotine patch  Type II diabetes mellitus with renal manifestations ([place]) Diet-controlled diabetes.  Recent A1c 5.3, well controlled.  Depression with anxiety Continue home medications  Anemia in CKD (chronic kidney disease) Last hemoglobin 9.9  Low back pain Left-sided.  Needs to get out of bed and ambulate more.        Subjective: Patient seen at dialysis.  Does complain of a little low back pain.  Does have some pain in his left shoulder but that is chronic with his rotator cuff tear.  Feels okay.  Physical Exam: Vitals:   06/29/22 1200 06/29/22 1230 06/29/22 1258 06/29/22 1339  BP: 90/73 (!) 151/84  (!) 143/77  Pulse:  65  77  Resp:    18  Temp:  97.7 F (36.5 C)  (!) 97 F (36.1 C)  TempSrc:  Oral    SpO2:    98%  Weight:   93 kg   Height:       Physical Exam HENT:     Head: Normocephalic.     Mouth/Throat:     Pharynx: No oropharyngeal  exudate.  Eyes:     General: Lids are normal.     Conjunctiva/sclera: Conjunctivae normal.  Cardiovascular:     Rate and Rhythm: Normal rate and regular rhythm.     Heart sounds: Normal heart sounds, S1 normal and S2 normal.  Pulmonary:     Breath sounds: No decreased breath sounds, wheezing, rhonchi or rales.  Abdominal:     Palpations: Abdomen is soft.     Tenderness: There is no abdominal tenderness.  Musculoskeletal:     Right lower leg: No swelling.     Left lower leg: No swelling.     Comments: Slight pain to left lower back musculoskeletal area.  Skin:    General: Skin is warm.     Findings: No rash.  Neurological:     Mental Status: He is alert and oriented to person, place, and time.     Data Reviewed: Creatinine 5.12, last hemoglobin 9.9   Disposition: Status is: Inpatient Remains inpatient appropriate because: Awaiting confirmation on outpatient dialysis slot.  Received 3 dialysis sessions here in the hospital  Planned Discharge Destination: Home    Time spent: 27 minutes  Author: Loletha Grayer, MD 06/29/2022 4:15 PM  For on call review [email or web address].

## 2022-06-29 NOTE — Progress Notes (Signed)
Received patient in bed, alert and oriented. Informed consent signed and in chart.  Time tx completed:1230  HD treatment completed.  Patient tolerated well.  HD catheter without signs and symptoms of complications.  Patient transported back to the room, alert and orient and in no acute distress.  Report given to bedside RN. Aurora Zahourek R.N  Total UF removed: 1 L  Medication given: Percocet  Post HD VS: :Bp 151/84   Hr 62   Rr 15   Temp 97.7   Post HD weight:  93 KG

## 2022-06-29 NOTE — Progress Notes (Signed)
[facility], [place] 06/29/22  Subjective:   Hospital day # 6  Patient seen and evaluated during dialysis   HEMODIALYSIS FLOWSHEET:  Blood Flow Rate (mL/min): 300 mL/min Arterial Pressure (mmHg): -110 mmHg Venous Pressure (mmHg): 130 mmHg TMP (mmHg): 14 mmHg Ultrafiltration Rate (mL/min): 600 mL/min Dialysate Flow Rate (mL/min): 300 ml/min Dialysis Fluid Bolus: Normal Saline Bolus Amount (mL): 300 mL  No complaints at this time   Renal: 07/20 0701 - 07/21 0700 In: -  Out: 3976 [Urine:1780; Stool:1] Lab Results  Component Value Date   CREATININE 5.12 (H) 06/28/2022   CREATININE 6.00 (H) 06/26/2022   CREATININE 5.72 (H) 06/25/2022     Objective:  Vital signs in last 24 hours:  Temp:  [97.7 F (36.5 C)-98.8 F (37.1 C)] 97.7 F (36.5 C) (07/21 1230) Pulse Rate:  [59-67] 65 (07/21 1230) Resp:  [12-24] 19 (07/21 1130) BP: (90-154)/(64-89) 151/84 (07/21 1230) SpO2:  [90 %-97 %] 95 % (07/21 1130) Weight:  [93 kg-93.7 kg] 93 kg (07/21 1258)  Weight change:  Filed Weights   06/28/22 0419 06/29/22 0805 06/29/22 1258  Weight: 99.1 kg 93.7 kg 93 kg    Intake/Output:    Intake/Output Summary (Last 24 hours) at 06/29/2022 1300 Last data filed at 06/29/2022 1241 Gross per 24 hour  Intake --  Output 1981 ml  Net -1981 ml      Physical Exam: General: NAD, laying in the bed  HEENT Anicteric, moist oral mucous membranes  Pulm/lungs Normal breathing effort, clear bilaterally  CVS/Heart No rub, soft systolic murmur  Abdomen:  Obese, soft, nontender  Extremities: Trace edema bilaterally  Neurologic: Alert, oriented.  Able to follow commands.  Skin: Warm, dry.  No acute rashes.  Access: Developing left arm AV fistula.  Good thrill. Rt Permcath       Basic Metabolic Panel:  Recent Labs  Lab 06/23/22 0807 06/24/22 0609 06/25/22 0445 06/26/22 0923 06/28/22 0538  NA 140 143 140 141 140  K 3.9 4.0 3.8 3.6 4.3  CL 114* 112* 111 111  110  CO2 21* 21* 20* 23 22  GLUCOSE 96 139* 135* 94 98  BUN 58* 74* 93* 99* 76*  CREATININE 4.37* 5.32* 5.72* 6.00* 5.12*  CALCIUM 8.5* 8.5* 8.3* 7.9* 8.3*  MG  --   --  2.4  --   --       CBC: Recent Labs  Lab 06/22/22 2209 06/23/22 0807 06/24/22 0609 06/28/22 0710  WBC 9.0 8.5 9.7 7.3  NEUTROABS 5.9  --   --   --   HGB 10.3* 10.2* 10.3* 9.9*  HCT 32.4* 31.3* 31.3* 29.9*  MCV 92.8 90.5 89.2 88.2  PLT 229 236 222 187       Lab Results  Component Value Date   HEPBSAG NON REACTIVE 06/26/2022      Microbiology:  No results found for this or any previous visit (from the past 240 hour(s)).  Coagulation Studies: No results for input(s): "LABPROT", "INR" in the last 72 hours.  Urinalysis: No results for input(s): "COLORURINE", "LABSPEC", "PHURINE", "GLUCOSEU", "HGBUR", "BILIRUBINUR", "KETONESUR", "PROTEINUR", "UROBILINOGEN", "NITRITE", "LEUKOCYTESUR" in the last 72 hours.  Invalid input(s): "APPERANCEUR"    Imaging: No results found.   Medications:     amLODipine  10 mg Oral Daily   aspirin EC  81 mg Oral Daily   calcitRIOL  0.25 mcg Oral Daily   carvedilol  25 mg Oral BID WC   Chlorhexidine Gluconate Cloth  6 each Topical Q0600  DULoxetine  60 mg Oral Daily   ezetimibe  10 mg Oral Daily   guaiFENesin  600 mg Oral BID   heparin  5,000 Units Subcutaneous Q8H   heparin sodium (porcine)       hydrALAZINE  100 mg Oral TID   losartan  50 mg Oral Daily   mometasone-formoterol  2 puff Inhalation BID   oxyCODONE-acetaminophen       pantoprazole  40 mg Oral QHS   pregabalin  50 mg Oral Daily   sodium bicarbonate  650 mg Oral TID   topiramate  50 mg Oral BID   torsemide  40 mg Oral Daily   traZODone  100 mg Oral QHS   acetaminophen **OR** acetaminophen, albuterol, guaiFENesin-dextromethorphan, heparin sodium (porcine), hydrALAZINE, hydrOXYzine, magnesium hydroxide, nitroGLYCERIN, ondansetron **OR** ondansetron (ZOFRAN) IV, mouth rinse,  oxyCODONE-acetaminophen, oxyCODONE-acetaminophen, traZODone  Assessment/ Plan:  56 y.o. 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  Patient receiving third dialysis treatment today, tolerating well. Continues to have good urine output, 1.78L. Patient has been seen sitting up in bed and in chair in room. Will be able to tolerate sitting up for outpatient treatment.  Renal navigator currently seeking outpatient clinic.   #Volume overload/malignant hypertension/lower extremity edema Presenting blood pressure of 227/107.  Current regimen includes amlodipine, carvedilol, hydralazine, Losartan and torsemide. .  Blood pressure 151/84 during dialysis   #Anemia of chronic kidney disease  Lab Results  Component Value Date   HGB 9.9 (L) 06/28/2022   Hgb within acceptable range.     LOS: [place] 7/21/20231:00 [place], [place]

## 2022-06-30 ENCOUNTER — Inpatient Hospital Stay: Payer: Medicare Other

## 2022-06-30 DIAGNOSIS — J9601 Acute respiratory failure with hypoxia: Secondary | ICD-10-CM | POA: Diagnosis not present

## 2022-06-30 DIAGNOSIS — I5033 Acute on chronic diastolic (congestive) heart failure: Secondary | ICD-10-CM | POA: Diagnosis not present

## 2022-06-30 DIAGNOSIS — N185 Chronic kidney disease, stage 5: Secondary | ICD-10-CM | POA: Diagnosis not present

## 2022-06-30 DIAGNOSIS — R062 Wheezing: Secondary | ICD-10-CM

## 2022-06-30 LAB — GLUCOSE, CAPILLARY
Glucose-Capillary: 92 mg/dL (ref 70–99)
Glucose-Capillary: 144 mg/dL — ABNORMAL HIGH (ref 70–99)
Glucose-Capillary: 127 mg/dL — ABNORMAL HIGH (ref 70–99)
Glucose-Capillary: 151 mg/dL — ABNORMAL HIGH (ref 70–99)

## 2022-06-30 MED ORDER — HYDRALAZINE HCL 10 MG PO TABS
10.0000 mg | ORAL_TABLET | Freq: Three times a day (TID) | ORAL | Status: DC
  Filled 2022-06-30: qty 1

## 2022-06-30 MED ORDER — IPRATROPIUM-ALBUTEROL 0.5-2.5 (3) MG/3ML IN SOLN
3.0000 mL | Freq: Four times a day (QID) | RESPIRATORY_TRACT | Status: DC
  Administered 2022-06-30: 3 mL via RESPIRATORY_TRACT
  Filled 2022-06-30: qty 3

## 2022-06-30 NOTE — Progress Notes (Signed)
  Progress Note   Patient: Derek Cooley. EHO:[id number] DOB: 1966/07/08 DOA: 06/23/2022     7 DOS: the patient was seen and examined on 06/30/2022     Assessment and Plan: * CKD (chronic kidney disease), stage V ([place]) For dialysis session on 06/29/2022.  Will need an outpatient dialysis slot prior to disposition.  PermCath placed 06/27/2022 by vascular surgery.  Acute on chronic diastolic CHF (congestive heart failure) ([place]) Patient was diuresed with IV Lasix.  Now with initiation of dialysis hopefully dialysis will manage fluid.  Continue Coreg, torsemide.  Wheeze Upper airway wheeze.  Restart nebulizer treatments.  Acute respiratory failure with hypoxia (HCC) Pulse ox 81% on the day of admission.  Required oxygen during the hospital course.  Currently now off oxygen.  Malignant hypertension On admission, continue home amlodipine, Coreg, oral hydralazine  COPD exacerbation ([place]) Completed treatment  Chest pain Troponin only mildly elevated.    HLD (hyperlipidemia) Patient is allergic to Lipitor.  Currently on fenofibrate  Tobacco abuse Continue nicotine patch  Type II diabetes mellitus with renal manifestations ([place]) Diet-controlled diabetes.  Recent A1c 5.3, well controlled.  Depression with anxiety Continue home medications  Anemia in CKD (chronic kidney disease) Last hemoglobin 9.9  Low back pain Left-sided.  Needs to get out of bed and ambulate more.        Subjective: Patient complains of a little upper airway wheeze.  Feels okay.  Has some itching at the catheter site.  Blood pressure little low this morning.  Started on dialysis this hospitalization.  Physical Exam: Vitals:   06/30/22 0954 06/30/22 1012 06/30/22 1124 06/30/22 1502  BP: (!) 111/59  (!) 97/53 (!) 119/59  Pulse: 70  (!) 57 60  Resp: 16  18 17   Temp:   97.6 F (36.4 C) 98 F (36.7 C)  TempSrc:      SpO2: 94% 95% 93% 91%  Weight:      Height:       Physical Exam HENT:      Head: Normocephalic.     Mouth/Throat:     Pharynx: No oropharyngeal exudate.  Eyes:     General: Lids are normal.     Conjunctiva/sclera: Conjunctivae normal.  Cardiovascular:     Rate and Rhythm: Normal rate and regular rhythm.     Heart sounds: Normal heart sounds, S1 normal and S2 normal.  Pulmonary:     Breath sounds: Transmitted upper airway sounds present. No decreased breath sounds, wheezing, rhonchi or rales.     Comments: Upper airway congestion Abdominal:     Palpations: Abdomen is soft.     Tenderness: There is no abdominal tenderness.  Musculoskeletal:     Right lower leg: No swelling.     Left lower leg: No swelling.     Comments: Slight pain to left lower back musculoskeletal area.  Skin:    General: Skin is warm.     Findings: No rash.  Neurological:     Mental Status: He is alert and oriented to person, place, and time.     Data Reviewed: Chest x-ray does not show pneumonia  Disposition: Status is: Inpatient Remains inpatient appropriate because: Still waiting for outpatient dialysis slot to be confirmed prior to disposition  Planned Discharge Destination: Home    Time spent: 28 minutes  Author: Loletha Grayer, MD 06/30/2022 3:42 PM  For on call review [email or web address].

## 2022-06-30 NOTE — Progress Notes (Signed)
[place], [place] 06/30/22  Subjective:   Hospital day # 7  Patient seen resting quietly in bed Tolerating meals without nausea and vomiting Remains on room air Tolerated dialysis well yesterday. No lower extremity edema   Renal: 07/21 0701 - 07/22 0700 In: 680 [P.O.:680] Out: 1800 [Urine:800] Lab Results  Component Value Date   CREATININE 5.12 (H) 06/28/2022   CREATININE 6.00 (H) 06/26/2022   CREATININE 5.72 (H) 06/25/2022     Objective:  Vital signs in last 24 hours:  Temp:  [97 F (36.1 C)-98.3 F (36.8 C)] 98.3 F (36.8 C) (07/22 0432) Pulse Rate:  [54-77] 73 (07/22 0432) Resp:  [12-20] 20 (07/22 0432) BP: (90-154)/(51-84) 94/66 (07/22 0432) SpO2:  [93 %-98 %] 96 % (07/22 0432) Weight:  [93 kg-93.1 kg] 93.1 kg (07/22 0444)  Weight change:  Filed Weights   06/29/22 0805 06/29/22 1258 06/30/22 0444  Weight: 93.7 kg 93 kg 93.1 kg    Intake/Output:    Intake/Output Summary (Last 24 hours) at 06/30/2022 8413 Last data filed at 06/30/2022 2440 Gross per 24 hour  Intake 680 ml  Output 1800 ml  Net -1120 ml      Physical Exam: General: NAD, laying in the bed  HEENT Anicteric, moist oral mucous membranes  Pulm/lungs Normal breathing effort, clear bilaterally  CVS/Heart No rub, soft systolic murmur  Abdomen:  Obese, soft, nontender  Extremities: No edema bilaterally  Neurologic: Alert, oriented.  Able to follow commands.  Skin: Warm, dry.  No acute rashes.  Access: Developing left arm AV fistula.  Good thrill. Rt Permcath       Basic Metabolic Panel:  Recent Labs  Lab 06/24/22 0609 06/25/22 0445 06/26/22 0923 06/28/22 0538  NA 143 140 141 140  K 4.0 3.8 3.6 4.3  CL 112* 111 111 110  CO2 21* 20* 23 22  GLUCOSE 139* 135* 94 98  BUN 74* 93* 99* 76*  CREATININE 5.32* 5.72* 6.00* 5.12*  CALCIUM 8.5* 8.3* 7.9* 8.3*  MG  --  2.4  --   --       CBC: Recent Labs  Lab 06/24/22 0609 06/28/22 0710  WBC 9.7 7.3   HGB 10.3* 9.9*  HCT 31.3* 29.9*  MCV 89.2 88.2  PLT 222 187       Lab Results  Component Value Date   HEPBSAG NON REACTIVE 06/26/2022      Microbiology:  No results found for this or any previous visit (from the past 240 hour(s)).  Coagulation Studies: No results for input(s): "LABPROT", "INR" in the last 72 hours.  Urinalysis: No results for input(s): "COLORURINE", "LABSPEC", "PHURINE", "GLUCOSEU", "HGBUR", "BILIRUBINUR", "KETONESUR", "PROTEINUR", "UROBILINOGEN", "NITRITE", "LEUKOCYTESUR" in the last 72 hours.  Invalid input(s): "APPERANCEUR"    Imaging: No results found.   Medications:     aspirin EC  81 mg Oral Daily   calcitRIOL  0.25 mcg Oral Daily   carvedilol  25 mg Oral BID WC   Chlorhexidine Gluconate Cloth  6 each Topical Q0600   DULoxetine  60 mg Oral Daily   ezetimibe  10 mg Oral Daily   guaiFENesin  600 mg Oral BID   heparin  5,000 Units Subcutaneous Q8H   hydrALAZINE  10 mg Oral TID   losartan  50 mg Oral Daily   mometasone-formoterol  2 puff Inhalation BID   pantoprazole  40 mg Oral QHS   pregabalin  50 mg Oral Daily   sodium bicarbonate  650 mg Oral TID  topiramate  50 mg Oral BID   torsemide  40 mg Oral Daily   traZODone  100 mg Oral QHS   acetaminophen **OR** acetaminophen, albuterol, guaiFENesin-dextromethorphan, hydrALAZINE, hydrOXYzine, magnesium hydroxide, nitroGLYCERIN, ondansetron **OR** ondansetron (ZOFRAN) IV, mouth rinse, oxyCODONE-acetaminophen, traZODone  Assessment/ Plan:  56 y.o. male with type 2 diabetes, advanced CKD from diabetic nephropathy, history of polysubstance abuse  admitted on 06/23/2022 for Chronic chest pain [R07.9, G89.29] Fluid overload [E87.70] Chronic kidney disease, unspecified CKD stage [N18.9] Acute on chronic congestive heart failure, unspecified heart failure type (HCC) [I50.9]  #End stage renal disease requiring hemodialysis Due to progression of kidney diease with no sustainable recovery, patient  is now considered End stage kidney failure.  Third dialysis treatment received yesterday.  UF 1 L removed.  Next treatment scheduled for Tuesday as per outpatient schedule. Renal navigator currently awaiting financial clearance for complete patient acceptance at [facility] on a TTS schedule.  #Volume overload/malignant hypertension/lower extremity edema Presenting blood pressure of 227/107.  Current regimen includes amlodipine, carvedilol, hydralazine, Losartan and torsemide. .  Blood pressure soft this morning, 94/66   #Anemia of chronic kidney disease  Lab Results  Component Value Date   HGB 9.9 (L) 06/28/2022   Hemoglobin remains at goal.  We will continue to monitor.    LOS: [place] 7/22/20238:12 [place], [place]

## 2022-06-30 NOTE — Assessment & Plan Note (Signed)
Upper airway wheeze.  Restart nebulizer treatments.

## 2022-07-01 DIAGNOSIS — R062 Wheezing: Secondary | ICD-10-CM | POA: Diagnosis not present

## 2022-07-01 DIAGNOSIS — I5033 Acute on chronic diastolic (congestive) heart failure: Secondary | ICD-10-CM | POA: Diagnosis not present

## 2022-07-01 DIAGNOSIS — J9601 Acute respiratory failure with hypoxia: Secondary | ICD-10-CM | POA: Diagnosis not present

## 2022-07-01 DIAGNOSIS — G4733 Obstructive sleep apnea (adult) (pediatric): Secondary | ICD-10-CM

## 2022-07-01 DIAGNOSIS — N185 Chronic kidney disease, stage 5: Secondary | ICD-10-CM | POA: Diagnosis not present

## 2022-07-01 LAB — BLOOD GAS, ARTERIAL
Acid-Base Excess: 3 mmol/L — ABNORMAL HIGH (ref 0.0–2.0)
Bicarbonate: 27.1 mmol/L (ref 20.0–28.0)
O2 Saturation: 99.4 %
Patient temperature: 37
pCO2 arterial: 39 mmHg (ref 32–48)
pH, Arterial: 7.45 (ref 7.35–7.45)
pO2, Arterial: 117 mmHg — ABNORMAL HIGH (ref 83–108)

## 2022-07-01 LAB — GLUCOSE, CAPILLARY: Glucose-Capillary: 91 mg/dL (ref 70–99)

## 2022-07-01 MED ORDER — HEPARIN SODIUM (PORCINE) 1000 UNIT/ML DIALYSIS: 1000.0000 [IU] | INTRAMUSCULAR | Status: DC | PRN

## 2022-07-01 MED ORDER — HYDRALAZINE HCL 10 MG PO TABS
10.0000 mg | ORAL_TABLET | Freq: Three times a day (TID) | ORAL | Status: DC
  Administered 2022-07-01 – 2022-07-02 (×3): 10 mg via ORAL
  Filled 2022-07-01 (×5): qty 1

## 2022-07-01 NOTE — Progress Notes (Signed)
Per Sobi RRT, patient will call for respiratory when he is ready to be placed on CPAP for the night.

## 2022-07-01 NOTE — TOC Progression Note (Signed)
Transition of Care (TOC) - Progression Note    Patient Details  Name: Derek Cooley. MRN: [id number] Date of Birth: Aug 23, 1966  Transition [facility] Care [facility]) CM/SW Contact  Beverly Sessions, RN Phone Number: 07/01/2022, 9:53 AM  Clinical Narrative:    Message sent to HD coordinator Elvera Bicker to follow up on outpatient HD placement    Expected Discharge Plan: Home/Self Care Barriers to Discharge: Barriers Resolved  Expected Discharge Plan and Services Expected Discharge Plan: Home/Self Care     Post Acute Care Choice: NA Living arrangements for the past 2 months: Single Family Home Expected Discharge Date: 06/26/22                                     Social Determinants of Health (SDOH) Interventions    Readmission Risk Interventions    06/25/2022    3:34 PM  Readmission Risk Prevention Plan  Transportation Screening Complete  Medication Review (RN Care Manager) Complete  PCP or Specialist appointment within 3-5 days of discharge Complete  SW Recovery Care/Counseling Consult Complete  [place] Not Applicable
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
## 2022-07-01 NOTE — Progress Notes (Signed)
[facility], [place] 07/01/22  Subjective:   Hospital day # 8  Patient seen sitting up in bed, resting comfortably Currently on room air, no distress noted No lower extremity edema    Renal: 07/22 0701 - 07/23 0700 In: 240 [P.O.:240] Out: 800 [Urine:800] Lab Results  Component Value Date   CREATININE 5.12 (H) 06/28/2022   CREATININE 6.00 (H) 06/26/2022   CREATININE 5.72 (H) 06/25/2022     Objective:  Vital signs in last 24 hours:  Temp:  [97.6 F (36.4 C)-98.9 F (37.2 C)] 97.9 F (36.6 C) (07/23 0748) Pulse Rate:  [57-63] 61 (07/23 0748) Resp:  [17-20] 20 (07/23 0748) BP: (97-159)/(53-80) 158/80 (07/23 0748) SpO2:  [91 %-100 %] 100 % (07/23 0748)  Weight change:  Filed Weights   06/29/22 0805 06/29/22 1258 06/30/22 0444  Weight: 93.7 kg 93 kg 93.1 kg    Intake/Output:    Intake/Output Summary (Last 24 hours) at 07/01/2022 1035 Last data filed at 07/01/2022 0836 Gross per 24 hour  Intake 240 ml  Output 1150 ml  Net -910 ml      Physical Exam: General: NAD  HEENT Anicteric, moist oral mucous membranes  Pulm/lungs Normal breathing effort, clear bilaterally  CVS/Heart No rub, soft systolic murmur  Abdomen:  Obese, soft, nontender  Extremities: No edema bilaterally  Neurologic: Alert, oriented.  Able to follow commands.  Skin: Warm, dry.  No acute rashes.  Access: Developing left arm AV fistula.  Good thrill. Rt Permcath       Basic Metabolic Panel:  Recent Labs  Lab 06/25/22 0445 06/26/22 0923 06/28/22 0538  NA 140 141 140  K 3.8 3.6 4.3  CL 111 111 110  CO2 20* 23 22  GLUCOSE 135* 94 98  BUN 93* 99* 76*  CREATININE 5.72* 6.00* 5.12*  CALCIUM 8.3* 7.9* 8.3*  MG 2.4  --   --       CBC: Recent Labs  Lab 06/28/22 0710  WBC 7.3  HGB 9.9*  HCT 29.9*  MCV 88.2  PLT 187       Lab Results  Component Value Date   HEPBSAG NON REACTIVE 06/26/2022      Microbiology:  No results found for this or  any previous visit (from the past 240 hour(s)).  Coagulation Studies: No results for input(s): "LABPROT", "INR" in the last 72 hours.  Urinalysis: No results for input(s): "COLORURINE", "LABSPEC", "PHURINE", "GLUCOSEU", "HGBUR", "BILIRUBINUR", "KETONESUR", "PROTEINUR", "UROBILINOGEN", "NITRITE", "LEUKOCYTESUR" in the last 72 hours.  Invalid input(s): "APPERANCEUR"    Imaging: DG Chest Port 1 View  Result Date: 06/30/2022 CLINICAL DATA:  Wheezing. EXAM: PORTABLE CHEST 1 VIEW COMPARISON:  AP chest 06/22/2022 and 01/22/2022 FINDINGS: Cardiac silhouette is again at the upper limits of normal size. No pericardial effusion is seen on recent 06/26/2022 CT. Mediastinal contours are within normal limits. New right internal jugular dual-lumen central venous catheter tip overlies the central superior vena cava and superior vena cava/right atrial junction, respectively. Interval improvement in the prior mild bilateral interstitial thickening. No focal airspace opacity to indicate pneumonia. No pleural effusion or pneumothorax. No acute skeletal abnormality. IMPRESSION: 1. New right internal jugular dual-lumen central venous catheter tip in appropriate position. 2. Unchanged borderline mild cardiomegaly. 3. Interval improvement in now minimal interstitial thickening suggesting minimal interstitial pulmonary edema. Electronically Signed   By: Yvonne Kendall M.D.   On: 06/30/2022 10:30     Medications:     aspirin EC  81 mg Oral Daily  calcitRIOL  0.25 mcg Oral Daily   carvedilol  25 mg Oral BID WC   Chlorhexidine Gluconate Cloth  6 each Topical Q0600   DULoxetine  60 mg Oral Daily   ezetimibe  10 mg Oral Daily   guaiFENesin  600 mg Oral BID   heparin  5,000 Units Subcutaneous Q8H   hydrALAZINE  10 mg Oral TID   losartan  50 mg Oral Daily   mometasone-formoterol  2 puff Inhalation BID   pantoprazole  40 mg Oral QHS   pregabalin  50 mg Oral Daily   sodium bicarbonate  650 mg Oral TID   topiramate   50 mg Oral BID   torsemide  40 mg Oral Daily   traZODone  100 mg Oral QHS   acetaminophen **OR** acetaminophen, albuterol, guaiFENesin-dextromethorphan, hydrALAZINE, hydrOXYzine, magnesium hydroxide, nitroGLYCERIN, ondansetron **OR** ondansetron (ZOFRAN) IV, mouth rinse, oxyCODONE-acetaminophen, traZODone  Assessment/ Plan:  56 y.o. 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   Next treatment scheduled for Tuesday as per outpatient schedule. Renal navigator awaiting financial clearance for complete patient acceptance at [facility] on a TTS schedule.  #Volume overload/malignant hypertension/lower extremity edema Presenting blood pressure of 227/107.  Current regimen includes carvedilol, ezetimibe,  Losartan and torsemide.  Hydralazine 10 mg 3 times daily restarted today.  Blood pressure currently 158/80   #Anemia of chronic kidney disease  Lab Results  Component Value Date   HGB 9.9 (L) 06/28/2022   Hemoglobin remains at goal.  We will continue to monitor.    LOS: [place] 7/23/202310:35 [place], [place]
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
## 2022-07-02 DIAGNOSIS — J9621 Acute and chronic respiratory failure with hypoxia: Secondary | ICD-10-CM | POA: Diagnosis not present

## 2022-07-02 DIAGNOSIS — N185 Chronic kidney disease, stage 5: Secondary | ICD-10-CM | POA: Diagnosis not present

## 2022-07-02 DIAGNOSIS — I5033 Acute on chronic diastolic (congestive) heart failure: Secondary | ICD-10-CM | POA: Diagnosis not present

## 2022-07-02 DIAGNOSIS — R062 Wheezing: Secondary | ICD-10-CM | POA: Diagnosis not present

## 2022-07-02 MED ORDER — NICOTINE 21 MG/24HR TD PT24
21.0000 mg | MEDICATED_PATCH | Freq: Every day | TRANSDERMAL | Status: DC
  Filled 2022-07-02: qty 1

## 2022-07-02 MED ORDER — PANTOPRAZOLE SODIUM 40 MG PO TBEC: 40.0000 mg | DELAYED_RELEASE_TABLET | Freq: Two times a day (BID) | ORAL | 0 refills | Status: DC | PRN

## 2022-07-02 MED ORDER — AMLODIPINE BESYLATE 10 MG PO TABS: 10.0000 mg | ORAL_TABLET | Freq: Every day | ORAL | 0 refills | Status: DC

## 2022-07-02 MED ORDER — TORSEMIDE 20 MG PO TABS: 40.0000 mg | ORAL_TABLET | Freq: Every day | ORAL | 0 refills | Status: DC

## 2022-07-02 MED ORDER — TOPIRAMATE 50 MG PO TABS
50.0000 mg | ORAL_TABLET | Freq: Two times a day (BID) | ORAL | 0 refills | Status: DC
Start: 2022-07-02 — End: 2022-08-02

## 2022-07-02 MED ORDER — DULOXETINE HCL 60 MG PO CPEP: 60.0000 mg | ORAL_CAPSULE | Freq: Every day | ORAL | 0 refills | Status: DC

## 2022-07-02 MED ORDER — PREGABALIN 50 MG PO CAPS: 50.0000 mg | ORAL_CAPSULE | Freq: Every day | ORAL | 0 refills | Status: DC

## 2022-07-02 MED ORDER — NICOTINE 21 MG/24HR TD PT24: MEDICATED_PATCH | TRANSDERMAL | 0 refills | Status: DC

## 2022-07-02 MED ORDER — CARVEDILOL 25 MG PO TABS: 25.0000 mg | ORAL_TABLET | Freq: Two times a day (BID) | ORAL | 0 refills | Status: DC

## 2022-07-02 MED ORDER — TRAZODONE HCL 100 MG PO TABS: 100.0000 mg | ORAL_TABLET | Freq: Every day | ORAL | 0 refills | Status: DC

## 2022-07-02 MED ORDER — CALCITRIOL 0.25 MCG PO CAPS: 0.2500 ug | ORAL_CAPSULE | Freq: Every day | ORAL | 0 refills | Status: DC

## 2022-07-02 MED ORDER — HYDROXYZINE HCL 50 MG PO TABS: ORAL_TABLET | ORAL | 0 refills | Status: DC

## 2022-07-02 MED ORDER — BUDESONIDE-FORMOTEROL FUMARATE 160-4.5 MCG/ACT IN AERO: 2.0000 | INHALATION_SPRAY | Freq: Two times a day (BID) | RESPIRATORY_TRACT | 0 refills | Status: DC

## 2022-07-02 MED ORDER — ALBUTEROL SULFATE HFA 108 (90 BASE) MCG/ACT IN AERS: 1.0000 | INHALATION_SPRAY | Freq: Four times a day (QID) | RESPIRATORY_TRACT | 0 refills | Status: DC | PRN

## 2022-07-02 MED ORDER — HYDRALAZINE HCL 10 MG PO TABS: 10.0000 mg | ORAL_TABLET | Freq: Three times a day (TID) | ORAL | 0 refills | Status: DC

## 2022-07-02 NOTE — Care Management Important Message (Signed)
Important Message  Patient Details  Name: Derek Cooley. MRN: [id number] Date of Birth: [birth year]   Medicare Important Message Given:  Yes     [name] 07/02/2022, 11:35 AM

## 2022-07-02 NOTE — TOC Transition Note (Signed)
Transition of Care [facility]) - CM/SW Discharge Note   Patient Details  Name: Derek Cooley. MRN: [id number] Date of Birth: [birth year]  [facility] CM/SW Contact:  Laurena Slimmer, RN Phone Number: 07/02/2022, 12:34 PM   Clinical Narrative:    Spoke with patient and his sister regarding discharge plan. Patient sister advised oxygen was ordered for patient and will be delivered to his room. HD chair time provided TTS beging 7/25 at 7am. Patient and sister denied any further questions. Oxygen requested via [employer]. TOC signing off.    Final next level of care: Home/Self Care Barriers to Discharge: Barriers Resolved   Patient Goals and CMS Choice        Discharge Placement                Patient to be transferred to facility by: Sister   Patient and family notified of of transfer: 06/26/22  Discharge Plan and Services     Post Acute Care Choice: NA                               Social Determinants of Health (SDOH) Interventions     Readmission Risk Interventions    06/25/2022    3:34 PM  Readmission Risk Prevention Plan  Transportation Screening Complete  Medication Review ([place]) Complete  PCP or Specialist appointment within 3-5 days of discharge Complete  SW Recovery Care/Counseling Consult Complete  [place] Not Applicable

## 2022-07-02 NOTE — Progress Notes (Signed)
PLACEMENT RESOLVED: [facility] TTS 7:00am. Start date: 7/25 at 6:15am.

## 2022-07-02 NOTE — Discharge Summary (Signed)
Physician Discharge Summary   Patient: Derek Cooley. MRN: [id number] DOB: 02-06-1966  Admit date:     06/23/2022  Discharge date: 07/02/22  Discharge Physician: Loletha Grayer   PCP: Jon Billings, NP   Recommendations at discharge:   Follow-up PCP 5 days Follow-up dialysis starting Tuesday  Discharge Diagnoses: Principal Problem:   CKD (chronic kidney disease), stage V (HCC) Active Problems:   Acute on chronic diastolic CHF (congestive heart failure) (HCC)   Wheeze   Acute on chronic respiratory failure with hypoxia (HCC)   COPD exacerbation (HCC)   Malignant hypertension   Chest pain   HLD (hyperlipidemia)   Tobacco abuse   Type II diabetes mellitus with renal manifestations (HCC)   Depression with anxiety   Anemia in CKD (chronic kidney disease)   Obstructive sleep apnea   Low back pain    Hospital Course: Derek Cooley. is a 56 y.o. 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 Lucky Cowboy), HTN, HLD, DM, COPD, asthma, depression with anxity, anemia, tobacco abuse, polysubstance abuse including cocaine, OSA on CPAP, mild cognitive impairment, dCHF, brain aneurysm, pancreatitis, SAH, vitreous hemorrhage, who presents with shortness breath and chest pain. Upon arrival to the emergency room, his blood pressure was 227/107, given IV hydralazine and nitroglycerin.  Patient also developed significant hypoxemia with oxygen saturation 85%.  He has a profound elevation of BNP, chest x-ray showed vascular congestion with interstitial edema.  Patient was diagnosed with acute on chronic congestive heart failure, was giving IV Lasix. 7/17.  Not able to complete a stress test, rescheduled for tomorrow.  Change diuretics to torsemide 40 mg daily.  Nephrology decided to initiate dialysis on this hospital stay.  The patient received 3 dialysis sessions during the hospital stay.  Needed to stay the weekend to confirm a dialysis slot.   Patient had a PermCath placed during the hospital course.  Upon discharge we did prescribe oxygen 3 L nasal cannula.  The patient did desaturate overnight and with ambulation but was able to hold her sats with walking.  Pulse ox best obtained on the ear.  Do not get a great wave when we are checking on his fingernails.  Assessment and Plan: * CKD (chronic kidney disease), stage V ([place]) Third dialysis session on 06/29/2022.  Will need an outpatient dialysis slot prior to disposition.  PermCath placed 06/27/2022 by vascular surgery.  Acute on chronic diastolic CHF (congestive heart failure) ([place]) Patient was diuresed with IV Lasix.  Now with initiation of dialysis hopefully dialysis will manage fluid.  Continue Coreg, hydralazine, torsemide.  Wheeze Better today after nebulizer treatments and coughing.  Prescribed inhalers upon discharge.  Acute on chronic respiratory failure with hypoxia (HCC) Pulse ox 81% on the day of admission.  Required oxygen during the hospital course.  Patient intermittently able to come off the oxygen that had intermittent desaturations.  Upon discharge home he was able to hold his saturations with 3 L of oxygen.  On room air he desaturated down to 82% with walking.  Borderline saturations of 88% at rest.  With whitish fingernails we did not get the best saturations on his fingernails.  Got better saturations on his ear.  Malignant hypertension On admission, continue home losartan, Coreg, oral hydralazine  COPD exacerbation ([place]) Completed treatment  Chest pain Troponin only mildly elevated.    HLD (hyperlipidemia) Patient is allergic to Lipitor.   Tobacco abuse Continue nicotine patch  Type  II diabetes mellitus with renal manifestations ([place]) Diet-controlled diabetes.  Recent A1c 5.3, well controlled.  Depression with anxiety Continue home medications  Anemia in CKD (chronic kidney disease) Last hemoglobin 9.9  Low back pain Left-sided.  Needs to  get out of bed and ambulate more.  Obstructive sleep apnea Patient states this CPAP was stolen.  We are unable to set up another CPAP for him.  Does not qualify for BiPAP.  Prescribing oxygen upon going home.  Will need outpatient sleep study.         Consultants: Cardiology, nephrology, vascular surgery Procedures performed: Permacath Disposition: Home Diet recommendation:  Discharge Diet Orders (From admission, onward)     Start     Ordered   06/26/22 0000  Diet general       Comments: Renal diet   06/26/22 1521   06/25/22 0000  Diet general       Comments: Renal diet   06/25/22 1038           Renal diet DISCHARGE MEDICATION: Allergies as of 07/02/2022       Reactions   Atorvastatin    Joint Aches - Severe Joint Aches - Severe Joint Aches - Severe   Avelox [moxifloxacin Hcl In Nacl]    Muscle pain   Buprenorphine    Mouth sores, confusion, shaking   Dilaudid [hydromorphone Hcl] Hives   Fluoxetine Itching   Levofloxacin Other (See Comments)   Joint Pain   Morphine And Related    Other    Muscle pain   Suboxone [buprenorphine Hcl-naloxone Hcl] Other (See Comments)   Rash and confused   Vancomycin    Renal insufficiency        Medication List     STOP taking these medications    diclofenac Sodium 1 % Gel Commonly known as: VOLTAREN   doxycycline 100 MG capsule Commonly known as: VIBRAMYCIN   fenofibrate micronized 67 MG capsule Commonly known as: LOFIBRA   gabapentin 100 MG capsule Commonly known as: NEURONTIN   HYDROcodone-acetaminophen 5-325 MG tablet Commonly known as: NORCO/VICODIN   lidocaine 5 % Commonly known as: Lidoderm       TAKE these medications    albuterol 108 (90 Base) MCG/ACT inhaler Commonly known as: VENTOLIN HFA Inhale 1-2 puffs into the lungs every 6 (six) hours as needed for wheezing or shortness of breath.   amLODipine 10 MG tablet Commonly known as: NORVASC Take 1 tablet (10 mg total) by mouth daily.    aspirin EC 81 MG tablet Take 1 tablet (81 mg total) by mouth daily. Swallow whole.   budesonide-formoterol 160-4.5 MCG/ACT inhaler Commonly known as: Symbicort Inhale 2 puffs into the lungs 2 (two) times daily. What changed: when to take this   calcitRIOL 0.25 MCG capsule Commonly known as: ROCALTROL Take 1 capsule (0.25 mcg total) by mouth daily.   carvedilol 25 MG tablet Commonly known as: COREG Take 1 tablet (25 mg total) by mouth 2 (two) times daily with a meal.   DULoxetine 60 MG capsule Commonly known as: CYMBALTA Take 1 capsule (60 mg total) by mouth daily.   ezetimibe 10 MG tablet Commonly known as: ZETIA Take 1 tablet (10 mg total) by mouth daily.   hydrALAZINE 10 MG tablet Commonly known as: APRESOLINE Take 1 tablet (10 mg total) by mouth 3 (three) times daily. What changed:  medication strength how much to take when to take this   hydrOXYzine 50 MG tablet Commonly known as: ATARAX TAKE ONE TABLET 3  TIMES DAILY AS NEEDED   nicotine 21 mg/24hr patch Commonly known as: NICODERM CQ - dosed in mg/24 hours One patch chest wall daily (okay to prescribe generic) Start taking on: July 03, 2022   OneTouch Ultra test strip Generic drug: glucose blood USE TO TEST BLOOD SUGAR DAILY   pantoprazole 40 MG tablet Commonly known as: PROTONIX Take 1 tablet (40 mg total) by mouth 2 (two) times daily as needed.   patiromer 8.4 g packet Commonly known as: VELTASSA Take 1 packet (8.4 g total) by mouth every Monday, Wednesday, and Friday. What changed: when to take this   pregabalin 50 MG capsule Commonly known as: LYRICA Take 1 capsule (50 mg total) by mouth daily.   topiramate 50 MG tablet Commonly known as: TOPAMAX Take 1 tablet (50 mg total) by mouth 2 (two) times daily.   torsemide 20 MG tablet Commonly known as: DEMADEX Take 2 tablets (40 mg total) by mouth daily. Start taking on: July 03, 2022   traZODone 100 MG tablet Commonly known as: DESYREL Take 1  tablet (100 mg total) by mouth at bedtime.               Durable Medical Equipment  (From admission, onward)           Start     Ordered   07/02/22 1159  For home use only DME oxygen  Once       Question Answer Comment  Length of Need Lifetime   Mode or (Route) Nasal cannula   Liters per Minute 3   Frequency Continuous (stationary and portable oxygen unit needed)   Oxygen conserving device Yes   Oxygen delivery system Gas      07/02/22 1158            Follow-up Information     Jon Billings, NP Follow up in 1 week(s).   Specialty: Nurse Practitioner Contact information: [place] [address] [phone]         Wellington Hampshire, MD. Go on 07/19/2022.   Specialty: Cardiology Why: See [place] on 8/10 at 2pm. Contact information: [place] [address] [phone]         Murlean Iba, MD Follow up in 1 week(s).   Specialty: Nephrology Contact information: [place] [address] [phone]                Discharge Exam: Danley Danker Weights   06/29/22 1258 06/30/22 0444 07/02/22 0601  Weight: 93 kg 93.1 kg 99.3 kg   Physical Exam HENT:     Head: Normocephalic.     Mouth/Throat:     Pharynx: No oropharyngeal exudate.  Eyes:     General: Lids are normal.     Conjunctiva/sclera: Conjunctivae normal.  Cardiovascular:     Rate and Rhythm: Normal rate and regular rhythm.     Heart sounds: Normal heart sounds, S1 normal and S2 normal.  Pulmonary:     Breath sounds: No transmitted upper airway sounds. No decreased breath sounds, wheezing, rhonchi or rales.  Abdominal:     Palpations: Abdomen is soft.     Tenderness: There is no abdominal tenderness.  Musculoskeletal:     Right lower leg: No swelling.     Left lower leg: No swelling.     Comments: Slight pain to left lower back musculoskeletal area.  Skin:    General: Skin is warm.     Findings: No rash.  Neurological:     Mental Status: He is alert and oriented to person, place, and time.      Condition at discharge: stable  The results of significant diagnostics from this hospitalization (including imaging, microbiology, ancillary and laboratory) are listed below for reference.   Imaging Studies: DG Chest Port 1 View  Result Date: 06/30/2022 CLINICAL DATA:  Wheezing. EXAM: PORTABLE CHEST 1 VIEW COMPARISON:  AP chest 06/22/2022 and 01/22/2022 FINDINGS: Cardiac silhouette is again at the upper limits of normal size. No pericardial effusion is seen on recent 06/26/2022 CT. Mediastinal contours are within normal limits. New right internal jugular dual-lumen central venous catheter tip overlies the central superior vena cava and superior vena cava/right atrial junction, respectively. Interval improvement in the prior mild bilateral interstitial thickening. No focal airspace opacity to indicate pneumonia. No pleural effusion or pneumothorax. No acute skeletal abnormality. IMPRESSION: 1. New right internal jugular dual-lumen central venous catheter tip in appropriate position. 2. Unchanged borderline mild cardiomegaly. 3. Interval improvement in now minimal interstitial thickening suggesting minimal interstitial pulmonary edema. Electronically Signed   By: Yvonne Kendall M.D.   On: 06/30/2022 10:30   PERIPHERAL VASCULAR CATHETERIZATION  Result Date: 06/27/2022 See surgical note for result.  CT CHEST WO CONTRAST  Result Date: 06/26/2022 CLINICAL DATA:  Pneumonia EXAM: CT CHEST WITHOUT CONTRAST TECHNIQUE: Multidetector CT imaging of the chest was performed following the standard protocol without IV contrast. RADIATION DOSE REDUCTION: This exam was performed according to the departmental dose-optimization program which includes automated exposure control, adjustment of the mA and/or kV according to patient size and/or use of iterative reconstruction technique. COMPARISON:  06/22/2022 FINDINGS:  Cardiovascular: Unenhanced imaging the heart demonstrates no cardiomegaly or pericardial effusion. Normal caliber of the thoracic aorta. Atherosclerosis of the aorta and coronary vasculature. Evaluation of the vascular lumen is limited without IV contrast. Mediastinum/Nodes: No enlarged mediastinal or axillary lymph nodes. Thyroid gland, trachea, and esophagus demonstrate no significant findings. Lungs/Pleura: Upper lobe predominant emphysema. No acute airspace disease, effusion, or pneumothorax. Resolution of the vascular congestion and interstitial edema seen previously. Central airways are patent. Upper Abdomen: No acute abnormality. Musculoskeletal: No acute or destructive bony lesions. Reconstructed images demonstrate no additional findings. IMPRESSION: 1. No acute intrathoracic process. Resolution of the vascular congestion and interstitial edema seen previously. 2. Aortic Atherosclerosis (ICD10-I70.0) and Emphysema (ICD10-J43.9). Electronically Signed   By: Randa Ngo M.D.   On: 06/26/2022 15:16   NM Myocar Multi W/Spect W/Wall Motion / EF  Result Date: 06/26/2022   Low risk, probably normal pharmacologic myocardial perfusion stress test.   There is no evidence of significant ischemia or scar, though evaluation of the inferior wall is quite limited due to extracardiac activity and diaphragmatic attenuation.   Left ventricular systolic function is normal by visual estimation.  Calculated LVEF or 40-45% is likely artifactually low due to significant extracardiac activity.   Mild coronary artery calcification is noted on the attenuation correction CT.   Faint hazy opacity is noted in the right upper lobe adjacent to the major fissure of uncertain clinical significance.  Dedicated chest CT could be obtained for further evaluation, as clinically indicated.   ECHOCARDIOGRAM COMPLETE  Result Date: 06/24/2022    ECHOCARDIOGRAM REPORT   Patient Name:   Derek Cooley. Date of Exam: 06/23/2022 Medical Rec  #:  [id number]          Height:       67.5 in Accession #:    [id number]  Weight:       218.4 lb Date of Birth:  11/08/1966         BSA:          2.111 m Patient Age:    1 years           BP:           149/82 mmHg Patient Gender: M                  HR:           71 bpm. Exam Location:  [facility] Procedure: 2D Echo Indications:     Chest Pain R07.9  History:         Patient has prior history of Echocardiogram examinations, most                  recent 11/27/2021.  Sonographer:     Kathlen Brunswick RDCS Referring Phys:  [id number] Kate Sable Diagnosing Phys: Kate Sable MD  Sonographer Comments: Technically difficult study due to poor echo windows. IMPRESSIONS  1. Left ventricular ejection fraction, by estimation, is 60 to 65%. The left ventricle has normal function. The left ventricle has no regional wall motion abnormalities. There is mild left ventricular hypertrophy. Left ventricular diastolic parameters were normal.  2. Right ventricular systolic function is normal. The right ventricular size is normal.  3. The mitral valve is normal in structure. No evidence of mitral valve regurgitation.  4. The aortic valve is tricuspid. Aortic valve regurgitation is not visualized. Aortic valve sclerosis is present, with no evidence of aortic valve stenosis. FINDINGS  Left Ventricle: Left ventricular ejection fraction, by estimation, is 60 to 65%. The left ventricle has normal function. The left ventricle has no regional wall motion abnormalities. The left ventricular internal cavity size was normal in size. There is  mild left ventricular hypertrophy. Left ventricular diastolic parameters were normal. Right Ventricle: The right ventricular size is normal. No increase in right ventricular wall thickness. Right ventricular systolic function is normal. Left Atrium: Left atrial size was normal in size. Right Atrium: Right atrial size was normal in size. Pericardium: There is no evidence of pericardial effusion.  Mitral Valve: The mitral valve is normal in structure. No evidence of mitral valve regurgitation. Tricuspid Valve: The tricuspid valve is not well visualized. Tricuspid valve regurgitation is not demonstrated. Aortic Valve: The aortic valve is tricuspid. Aortic valve regurgitation is not visualized. Aortic valve sclerosis is present, with no evidence of aortic valve stenosis. Aortic valve peak gradient measures 7.4 mmHg. Pulmonic Valve: The pulmonic valve was not well visualized. Pulmonic valve regurgitation is not visualized. Aorta: The aortic root and ascending aorta are structurally normal, with no evidence of dilitation. Venous: The inferior vena cava was not well visualized. IAS/Shunts: No atrial level shunt detected by color flow Doppler.  LEFT VENTRICLE PLAX 2D LVIDd:         4.79 cm      Diastology LVIDs:         3.15 cm      LV e' medial:    7.29 cm/s LV PW:         1.52 cm      LV E/e' medial:  11.9 LV IVS:        1.77 cm      LV e' lateral:   5.66 cm/s LVOT diam:     2.20 cm      LV E/e' lateral: 15.3 LV SV:  79 LV SV Index:   38 LVOT Area:     3.80 cm  LV Volumes (MOD) LV vol d, MOD A4C: 118.0 ml LV vol s, MOD A4C: 33.0 ml LV SV MOD A4C:     118.0 ml RIGHT VENTRICLE RV Basal diam:  3.35 cm RV S prime:     19.50 cm/s TAPSE (M-mode): 3.2 cm LEFT ATRIUM           Index        RIGHT ATRIUM           Index LA diam:      4.10 cm 1.94 cm/m   RA Area:     16.50 cm LA Vol (A2C): 76.1 ml 36.05 ml/m  RA Volume:   43.70 ml  20.70 ml/m LA Vol (A4C): 26.9 ml 12.74 ml/m  AORTIC VALVE                 PULMONIC VALVE AV Area (Vmax): 2.74 cm     PV Vmax:       1.06 m/s AV Vmax:        136.00 cm/s  PV Peak grad:  4.5 mmHg AV Peak Grad:   7.4 mmHg LVOT Vmax:      97.90 cm/s LVOT Vmean:     60.500 cm/s LVOT VTI:       0.209 m  AORTA Ao Root diam: 3.40 cm Ao Asc diam:  3.60 cm MITRAL VALVE MV Area (PHT): 3.05 cm    SHUNTS MV Decel Time: 249 msec    Systemic VTI:  0.21 m MV E velocity: 86.50 cm/s  Systemic Diam:  2.20 cm MV A velocity: 79.30 cm/s MV E/A ratio:  1.09 Kate Sable MD Electronically signed by Kate Sable MD Signature Date/Time: 06/24/2022/10:00:41 AM    Final    CT HEAD WO CONTRAST (5MM)  Result Date: 06/23/2022 CLINICAL DATA:  57 year old male with hypertensive emergency. Chest pain and shortness of breath. Cocaine. EXAM: CT HEAD WITHOUT CONTRAST TECHNIQUE: Contiguous axial images were obtained from the base of the skull through the vertex without intravenous contrast. RADIATION DOSE REDUCTION: This exam was performed according to the departmental dose-optimization program which includes automated exposure control, adjustment of the mA and/or kV according to patient size and/or use of iterative reconstruction technique. COMPARISON:  Head CT 11/25/2021. 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 Chronic encephalomalacia in the nearby anterior right temporal lobe. 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 Chronic left frontal lobe white matter hypodensity which might be a previous EVD tract. Stable cerebral volume. No midline shift, ventriculomegaly, mass effect, evidence of mass lesion, intracranial hemorrhage or evidence of cortically based acute infarction. Gray-white matter differentiation is within normal limits throughout the brain. Vascular: Calcified atherosclerosis at the skull base. 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 No suspicious intracranial vascular hyperdensity. Skull: Previous right frontotemporal craniotomy is stable. Previous left vertex burr hole is stable. No acute osseous abnormality identified. Sinuses/Orbits: Bilateral maxillary sinus fluid levels. But minor paranasal sinus mucosal thickening elsewhere. Tympanic cavities and mastoids are clear. Other: Chronic scalp soft tissue scarring. No acute orbit or scalp soft tissue finding. IMPRESSION: 1.  No acute intracranial abnormality. 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 Chronic right temporal lobe encephalomalacia. Chronic small vessel disease. 2. Bilateral maxillary sinus fluid levels raising the possibility of acute sinusitis. Electronically Signed   By: Herminio Heads.D.  On: 06/23/2022 10:26   DG Chest Portable 1 View  Result Date: 06/22/2022 CLINICAL DATA:  Chest pain with shortness of breath. EXAM: PORTABLE CHEST 1 VIEW COMPARISON:  Portable chest 01/22/2022. FINDINGS: The heart silhouette is increased in size compared to the prior study. There is increased perihilar vascular congestion and development of interstitial edema in the perihilar areas and bases with small pleural effusions also beginning to form. No focal airspace infiltrate is seen. The lungs are otherwise clear. The mediastinum is normally outlined. There is moderate bilateral shoulder DJD. IMPRESSION: Cardiomegaly with vascular congestion and interstitial edema consistent with CHF or fluid overload. Small pleural effusions are forming but no focal infiltrate is seen. Progress chest films recommended depending on clinical response. Electronically Signed   By: Telford Nab M.D.   On: 06/22/2022 22:27    Microbiology: Results for orders placed or performed during the hospital encounter of 01/22/22  Resp Panel by RT-PCR (Flu A&B, Covid) Nasopharyngeal Swab     Status: None   Collection Time: 01/23/22 12:06 AM   Specimen: Nasopharyngeal Swab; Nasopharyngeal(NP) swabs in vial transport medium  Result Value Ref Range Status   SARS Coronavirus 2 by RT PCR NEGATIVE NEGATIVE Final    Comment: (NOTE) SARS-CoV-2 target nucleic acids are NOT DETECTED.  The SARS-CoV-2 RNA is generally detectable in upper respiratory specimens during the acute phase of infection. The lowest concentration of SARS-CoV-2 viral copies this assay can detect is 138 copies/mL. A negative result does not  preclude SARS-Cov-2 infection and should not be used as the sole basis for treatment or other patient management decisions. A negative result may occur with  improper specimen collection/handling, submission of specimen other than nasopharyngeal swab, presence of viral mutation(s) within the areas targeted by this assay, and inadequate number of viral copies(<138 copies/mL). A negative result must be combined with clinical observations, patient history, and epidemiological information. The expected result is Negative.  Fact Sheet for Patients:  EntrepreneurPulse.com.au  Fact Sheet for Healthcare Providers:  IncredibleEmployment.be  This test is no t yet approved or cleared by the [place] FDA and  has been authorized for detection and/or diagnosis of SARS-CoV-2 by FDA under an Emergency Use Authorization (EUA). This EUA will remain  in effect (meaning this test can be used) for the duration of the COVID-19 declaration under Section 564(b)(1) of the Act, 21 U.S.C.section 360bbb-3(b)(1), unless the authorization is terminated  or revoked sooner.       Influenza A by PCR NEGATIVE NEGATIVE Final   Influenza B by PCR NEGATIVE NEGATIVE Final    Comment: (NOTE) The Xpert Xpress SARS-CoV-2/FLU/RSV plus assay is intended as an aid in the diagnosis of influenza from Nasopharyngeal swab specimens and should not be used as a sole basis for treatment. Nasal washings and aspirates are unacceptable for Xpert Xpress SARS-CoV-2/FLU/RSV testing.  Fact Sheet for Patients: EntrepreneurPulse.com.au  Fact Sheet for Healthcare Providers: IncredibleEmployment.be  This test is not yet approved or cleared by the [place] FDA and has been authorized for detection and/or diagnosis of SARS-CoV-2 by FDA under an Emergency Use Authorization (EUA). This EUA will remain in effect (meaning this test can be used) for the  duration of the COVID-19 declaration under Section 564(b)(1) of the Act, 21 U.S.C. section 360bbb-3(b)(1), unless the authorization is terminated or revoked.  Performed at [facility], [address] [place], [place] [address]     Labs: CBC: Recent Labs  Lab 06/28/22 0710  WBC 7.3  HGB 9.9*  HCT  29.9*  MCV 88.2  PLT 661   Basic Metabolic Panel: Recent Labs  Lab 06/26/22 0923 06/28/22 0538  NA 141 140  K 3.6 4.3  CL 111 110  CO2 23 22  GLUCOSE 94 98  BUN 99* 76*  CREATININE 6.00* 5.12*  CALCIUM 7.9* 8.3*   Liver Function Tests: No results for input(s): "AST", "ALT", "ALKPHOS", "BILITOT", "PROT", "ALBUMIN" in the last 168 hours. CBG: Recent Labs  Lab 06/30/22 0844 06/30/22 1129 06/30/22 1619 06/30/22 1957 07/01/22 0831  GLUCAP 92 144* 127* 151* 91    Discharge time spent: greater than 30 minutes.  Signed: Loletha Grayer, MD [facility] 07/02/2022

## 2022-07-02 NOTE — Progress Notes (Signed)
New Dialysis Start   Patient identified as new dialysis start. Kidney Education packet assembled and given. Discussed the following items with patient:    Current medications and possible changes once started:  Discussed that patient's medications may change over time.  Ex; hypertension medications and diabetes medication.  Nephrologists will adjust as needed.  Fluid restrictions reviewed:  32 oz daily goal:  All liquids count; soups, ice, jello   Phosphorus and potassium: Handout given showing high potassium and phosphorus foods.  Alternative food and drink options given.  Family support:  no family present in room  Outpatient Clinic Resources:  Discussed roles of Outpatient clinic  staff and advised to make a list of needs, if any, to talk with outpatient staff if needed  Care plan schedule: Informed patient and family member of Care Plans in outpatient setting and to participate in the care plan.  An invitation would be given from outpatient clinic.   Dialysis Access Options:  Reviewed access options with patients. Discussed in detail about care at home with new AVG & AVF. Reviewed checking bruit and thrill. If dialysis catheter present, educated that patient could not take showers.  Catheter dressing changes were to be done by outpatient clinic staff only  Home therapy options:  Educated patient about home therapy options:  PD vs home hemo.     Patient verbalized understanding. Will continue to round on patient during admission.    Lilia Argue, RN

## 2022-07-02 NOTE — Progress Notes (Signed)
[facility], [place] 07/02/22  Subjective:   Hospital day # 9  Patient seen resting comfortably in bed Tolerating breakfast without concern Remains on room air Complains of some neck discomfort, popping Continues to void appropriately    Renal: 07/23 0701 - 07/24 0700 In: -  Out: 350 [Urine:350] Lab Results  Component Value Date   CREATININE 5.12 (H) 06/28/2022   CREATININE 6.00 (H) 06/26/2022   CREATININE 5.72 (H) 06/25/2022     Objective:  Vital signs in last 24 hours:  Temp:  [97.4 F (36.3 C)-98.1 F (36.7 C)] 98.1 F (36.7 C) (07/24 0757) Pulse Rate:  [56-110] 63 (07/24 0758) Resp:  [15-20] 20 (07/24 0757) BP: (99-131)/(48-75) 122/57 (07/24 0757) SpO2:  [90 %-100 %] 100 % (07/24 0758) Weight:  [99.3 kg] 99.3 kg (07/24 0601)  Weight change:  Filed Weights   06/29/22 1258 06/30/22 0444 07/02/22 0601  Weight: 93 kg 93.1 kg 99.3 kg    Intake/Output:    Intake/Output Summary (Last 24 hours) at 07/02/2022 1308 Last data filed at 07/02/2022 0800 Gross per 24 hour  Intake --  Output 600 ml  Net -600 ml      Physical Exam: General: NAD  HEENT Anicteric, moist oral mucous membranes  Pulm/lungs Normal breathing effort, clear bilaterally  CVS/Heart No rub, soft systolic murmur  Abdomen:  Obese, soft, nontender  Extremities: No edema bilaterally  Neurologic: Alert, oriented.  Able to follow commands.  Skin: Warm, dry.  No acute rashes.  Access: Developing left arm AV fistula.  Good thrill. Rt Permcath       Basic Metabolic Panel:  Recent Labs  Lab 06/26/22 0923 06/28/22 0538  NA 141 140  K 3.6 4.3  CL 111 110  CO2 23 22  GLUCOSE 94 98  BUN 99* 76*  CREATININE 6.00* 5.12*  CALCIUM 7.9* 8.3*      CBC: Recent Labs  Lab 06/28/22 0710  WBC 7.3  HGB 9.9*  HCT 29.9*  MCV 88.2  PLT 187       Lab Results  Component Value Date   HEPBSAG NON REACTIVE 06/26/2022      Microbiology:  No results found for  this or any previous visit (from the past 240 hour(s)).  Coagulation Studies: No results for input(s): "LABPROT", "INR" in the last 72 hours.  Urinalysis: No results for input(s): "COLORURINE", "LABSPEC", "PHURINE", "GLUCOSEU", "HGBUR", "BILIRUBINUR", "KETONESUR", "PROTEINUR", "UROBILINOGEN", "NITRITE", "LEUKOCYTESUR" in the last 72 hours.  Invalid input(s): "APPERANCEUR"    Imaging: No results found.   Medications:     aspirin EC  81 mg Oral Daily   calcitRIOL  0.25 mcg Oral Daily   carvedilol  25 mg Oral BID WC   Chlorhexidine Gluconate Cloth  6 each Topical Q0600   DULoxetine  60 mg Oral Daily   ezetimibe  10 mg Oral Daily   guaiFENesin  600 mg Oral BID   heparin  5,000 Units Subcutaneous Q8H   hydrALAZINE  10 mg Oral TID   losartan  50 mg Oral Daily   mometasone-formoterol  2 puff Inhalation BID   nicotine  21 mg Transdermal Daily   pantoprazole  40 mg Oral QHS   pregabalin  50 mg Oral Daily   topiramate  50 mg Oral BID   torsemide  40 mg Oral Daily   traZODone  100 mg Oral QHS   acetaminophen **OR** acetaminophen, albuterol, guaiFENesin-dextromethorphan, heparin, hydrALAZINE, hydrOXYzine, magnesium hydroxide, nitroGLYCERIN, ondansetron **OR** ondansetron (ZOFRAN) IV, mouth rinse, oxyCODONE-acetaminophen  Assessment/ Plan:  56 y.o. 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  Patient has been cleared to discharge from renal stance and will resume outpatient treatments at [facility] on a TTS schedule.  #Volume  overload/malignant hypertension/lower extremity edema Presenting blood pressure of 227/107.  Current regimen includes carvedilol, ezetimibe,  Losartan and torsemide.  Hydralazine 10 mg 3 times daily restarted yesterday.  Blood pressure improved, 122/57   #Anemia of chronic kidney disease  Lab Results  Component Value Date   HGB 9.9 (L) 06/28/2022    We will continue to monitor.    LOS: [place] 7/24/20231:08 [place], [place]

## 2022-07-02 NOTE — Progress Notes (Signed)
Room Air 87% laying down 87% sitting 82% walking  Oxygen @ 3 liters - monitored with ear pulse ox 96% laying down 97% sitting up 91-92 % walking

## 2022-07-02 NOTE — Progress Notes (Addendum)
Mobility Specialist - Progress Note   07/02/22 1100  Mobility  Activity Ambulated with assistance in hallway  Level of Assistance Standby assist, set-up cues, supervision of patient - no hands on  Assistive Device Front wheel walker  Distance Ambulated (ft) 180 ft  Activity Response Tolerated well  $Mobility charge 1 Mobility     O2 while resting on RA = 88%  O2 while AMB on RA = 81%  O2 while AMB on 6L = 92%   Pt lying in bed upon arrival, utilizing RA. Poor pleth on 3 different devices on several different phalanges/ear lobes throughout session, but O2 seemingly reading at 88% resting. O2 desat to 81% with EOB transfer and O2 was increased to 2L with little-no improvement. During ambulation, O2 ranged between 78-84% with improvement > 88% only when bumped to 4L. Pt SOB on exertion but no s/s of significant distress. 1 seated rest break taken. Pt inappropriate at times during session. Returned to bed with alarm set, needs in reach.    Kathee Delton Mobility Specialist 07/02/22, 11:44 AM

## 2022-07-02 NOTE — Discharge Instructions (Signed)
Wear 3L oxygen 24/7

## 2022-07-03 ENCOUNTER — Telehealth: Payer: Self-pay | Admitting: *Deleted

## 2022-07-03 NOTE — Telephone Encounter (Signed)
Transition Care Management Follow-up Telephone Call Date of discharge and from where: [facility] 7-24 How have you been since you were released from the hospital? Per sister he is doing ok Any questions or concerns? No  Items Reviewed: Did the pt receive and understand the discharge instructions provided? Yes  Medications obtained and verified?  Other?  Any new allergies since your discharge? No  Dietary orders reviewed? Yes Do you have support at home?   Home Care and Equipment/Supplies: Were home health services ordered?  If so, what is the name of the agency?   Has the agency set up a time to come to the patient's home?  Were any new equipment or medical supplies ordered?   What is the name of the medical supply agency?  Were you able to get the supplies/equipment?  Do you have any questions related to the use of the equipment or supplies?   Functional Questionnaire: (I = Independent and D = Dependent) ADLs: I  Bathing/Dressing- I  Meal Prep- I  Eating- I  Maintaining continence- I  Transferring/Ambulation- I  Managing Meds- I  Follow up appointments reviewed:  PCP Hospital f/u appt confirmed? PATIETNT SISTER BRINGS HIM TO ALL APPOINTMENTS SHE WILL CALL TO [place] [facility] f/u appt confirmed?  07-05-2022 Vascular  . Are transportation arrangements needed? No  If their condition worsens, is the pt aware to call PCP or go to the Emergency Dept.? No Was the patient provided with contact information for the PCP's office or ED? No Was to pt encouraged to call back with questions or concerns? No

## 2022-07-04 ENCOUNTER — Other Ambulatory Visit (INDEPENDENT_AMBULATORY_CARE_PROVIDER_SITE_OTHER): Payer: Self-pay | Admitting: Vascular Surgery

## 2022-07-04 DIAGNOSIS — Z9889 Other specified postprocedural states: Secondary | ICD-10-CM

## 2022-07-04 DIAGNOSIS — N185 Chronic kidney disease, stage 5: Secondary | ICD-10-CM

## 2022-07-05 ENCOUNTER — Ambulatory Visit (INDEPENDENT_AMBULATORY_CARE_PROVIDER_SITE_OTHER): Payer: Medicare Other | Admitting: Nurse Practitioner

## 2022-07-05 ENCOUNTER — Ambulatory Visit (INDEPENDENT_AMBULATORY_CARE_PROVIDER_SITE_OTHER): Payer: Medicare Other

## 2022-07-05 DIAGNOSIS — N186 End stage renal disease: Secondary | ICD-10-CM

## 2022-07-05 DIAGNOSIS — E1122 Type 2 diabetes mellitus with diabetic chronic kidney disease: Secondary | ICD-10-CM

## 2022-07-05 DIAGNOSIS — Z9889 Other specified postprocedural states: Secondary | ICD-10-CM

## 2022-07-05 DIAGNOSIS — N185 Chronic kidney disease, stage 5: Secondary | ICD-10-CM

## 2022-07-05 DIAGNOSIS — E1159 Type 2 diabetes mellitus with other circulatory complications: Secondary | ICD-10-CM

## 2022-07-05 DIAGNOSIS — I152 Hypertension secondary to endocrine disorders: Secondary | ICD-10-CM

## 2022-07-05 MED ORDER — SILVER SULFADIAZINE 1 % EX CREA: 1.0000 | TOPICAL_CREAM | Freq: Every day | CUTANEOUS | 0 refills | Status: DC

## 2022-07-05 NOTE — Progress Notes (Signed)
Subjective:    Patient ID: Derek Cooley., male    DOB: 11/26/1966, 56 y.o.   MRN: [id number] Chief Complaint  Patient presents with   Vascular Access Problem    Evaulate dialysis access    The patient returns to the office for followup of their dialysis access.   The patient underwent placement of a left radiocephalic AV fistula on 2/62/0355.  Patient does complain of some numbness of the thumb and forefinger.  He also has a wound on his access site.  It is scabbed over.  No significant drainage.  No signs symptoms of infection or cellulitis. The patient denies redness or swelling at the access site. The patient denies fever or chills at home or while on dialysis.  No recent shortening of the patient's walking distance or new symptoms consistent with claudication.  No history of rest pain symptoms. No new ulcers or wounds of the lower extremities have occurred.  The patient denies amaurosis fugax or recent TIA symptoms. There are no recent neurological changes noted. There is no history of DVT, PE or superficial thrombophlebitis. No recent episodes of angina or shortness of breath documented.   Duplex ultrasound of the AV access shows a patent access.  The patient has a flow volume of 735.  It is noted that there was a stricture within the mid forearm.    Review of Systems  Skin:  Positive for wound.  All other systems reviewed and are negative.      Objective:   Physical Exam Vitals reviewed.  HENT:     Head: Normocephalic.  Cardiovascular:     Rate and Rhythm: Normal rate.     Pulses:          Radial pulses are 1+ on the left side.     Arteriovenous access: Left arteriovenous access is present.    Comments: Good thrill and bruit Pulmonary:     Effort: Pulmonary effort is normal.  Skin:    General: Skin is warm and dry.  Neurological:     Mental Status: He is alert and oriented to person, place, and time.  Psychiatric:        Mood and Affect: Mood normal.         Behavior: Behavior normal.        Thought Content: Thought content normal.        Judgment: Judgment normal.     There were no vitals taken for this visit.  Past Medical History:  Diagnosis Date   Abdominal abscess    a.) chronic MRSA infection   Acute pancreatitis    Anxiety    Asthma    Brain aneurysm    a.) 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 b.) RIGHT crainotomy for clip ligation 05/14/2019.   Chronic back pain    Chronic heart failure with preserved ejection fraction (HFpEF) ([place])    a. 11/2021 Echo: EF 55-60%, no rwma, mod LVH, GrI DD. Nl RV size/fxn. Mildly dil LA.   CKD (chronic kidney disease), stage V (HCC)    Depression    Emphysema of lung (HCC)    Erectile dysfunction    Followed by palliative care service    GERD (gastroesophageal reflux disease)    History of methicillin resistant staphylococcus aureus (MRSA)    HLD (hyperlipidemia)    Hypertension    Mild cognitive impairment    MRSA (methicillin resistant Staphylococcus aureus)    Obesity  OSA (obstructive sleep apnea) 2013   a.) not compliant with nocturnal PAP therapy; CPAP machine "lost or stolen".   Panic disorder    Perforated bowel ([place]) 12/10/2005   Tempoary Colostomy Bag, Skin Graft for Abd wound   Polysubstance abuse ([place])    a.) ETOH, tobacco, marijuana, methamphetamines, cocaine, BZO, opioids.   Subarachnoid hemorrhage ([place]) 01/26/2010   a.) 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   T2DM (type 2 diabetes mellitus) ([place])    Tobacco abuse    Type 2 diabetes mellitus without complication, without long-term current use of insulin ([place]) 04/17/2016   Vitreous hemorrhage ([place]) 03/27/2011   Overview:  Bilateral; 01/2010, from [facility]     Social History   Socioeconomic History   Marital status: Divorced    Spouse name: Not on file   Number of  children: Not on file   Years of education: Not on file   Highest education level: Not on file  Occupational History   Not on file  Tobacco Use   Smoking status: Every Day    Packs/day: 0.50    Types: Cigarettes    Start date: 10/11/1984   Smokeless tobacco: Never  Vaping Use   Vaping Use: Never used  Substance and Sexual Activity   Alcohol use: Yes   Drug use: Not Currently    Types: "Crack" cocaine, Amphetamines, Methamphetamines, Benzodiazepines, Cocaine, Marijuana, Other-see comments    Comment: opioids   Sexual activity: Not Currently  Other Topics Concern   Not on file  Social History Narrative   Lives at home with sister.   Social Determinants of Health   Financial Resource Strain: Medium Risk (06/11/2022)   Overall Financial Resource Strain (CARDIA)    Difficulty of Paying Living Expenses: Somewhat hard  Food Insecurity: No Food Insecurity (06/11/2022)   Hunger Vital Sign    Worried About Running Out of Food in the Last Year: Never true    Ran Out of Food in the Last Year: Never true  Transportation Needs: Unmet Transportation Needs (06/11/2022)   PRAPARE - Hydrologist (Medical): Yes    Lack of Transportation (Non-Medical): No  Physical Activity: Inactive (06/11/2022)   Exercise Vital Sign    Days of Exercise per Week: 0 days    Minutes of Exercise per Session: 0 min  Stress: No Stress Concern Present (06/11/2022)   [place]    Feeling of Stress : Not at all  Social Connections: Socially Isolated (06/11/2022)   Social Connection and Isolation Panel [NHANES]    Frequency of Communication with Friends and Family: More than three times a week    Frequency of Social Gatherings with Friends and Family: More than three times a week    Attends Religious Services: Never    Marine scientist or Organizations: No    Attends Archivist Meetings: Never    Marital Status:  Divorced  Human resources officer Violence: Not At Risk (06/11/2022)   Humiliation, Afraid, Rape, and Kick questionnaire    Fear of Current or Ex-Partner: No    Emotionally Abused: No    Physically Abused: No    Sexually Abused: No    Past Surgical History:  Procedure Laterality Date   AV FISTULA PLACEMENT Left 05/24/2022   Procedure: ARTERIOVENOUS (AV) FISTULA CREATION ( RADIAL CEPHALIC);  Surgeon: Algernon Huxley, MD;  Location: [facility] ORS;  Service: Vascular;  Laterality: Left;   CEREBRAL ANEURYSM REPAIR Right 01/26/2010   Procedure: CEREBRAL ANEURYSM REPAIR (COIL EMBOLIZATION)   CEREBRAL ANEURYSM REPAIR Right 05/14/2019   Procedure: CRAINOTOMY FOR CEREBRAL ANEURYSM REPAIR (CLIP LIGATION)   COLON SURGERY  12/10/2005   colostomy bag placed s/p perforated bowel   COLONOSCOPY WITH PROPOFOL N/A 05/18/2020   Procedure: COLONOSCOPY WITH PROPOFOL;  Surgeon: Lin Landsman, MD;  Location: [place];  Service: Gastroenterology;  Laterality: N/A;   DIALYSIS/PERMA CATHETER INSERTION N/A 06/27/2022   Procedure: DIALYSIS/PERMA CATHETER INSERTION;  Surgeon: Katha Cabal, MD;  Location: [facility];  Service: Cardiovascular;  Laterality: N/A;   KNEE SURGERY Right    Perforated bowel      Family History  Problem Relation Age of Onset   Arthritis Mother    Asthma Mother    Diabetes Mother    Heart disease Mother    Hyperlipidemia Mother    Hypertension Mother    Kidney disease Mother    Thyroid disease Mother    Lung disease Mother    Anxiety disorder Mother    Depression Mother    Diabetes Father    Heart disease Father    Depression Father    Anxiety disorder Father    Arthritis Sister    Asthma Sister    Hyperlipidemia Sister    Hypertension Sister    Lung disease Sister    Anxiety disorder Sister    Depression Sister    Hyperlipidemia Brother    Hypertension Brother    Diabetes Sister    Heart disease Sister    Depression Sister    Anxiety disorder Sister     Anxiety disorder Brother    Depression Brother    Heart disease Brother     Allergies  Allergen Reactions   Atorvastatin     Joint Aches - Severe Joint Aches - Severe Joint Aches - Severe   Avelox [Moxifloxacin Hcl In Nacl]     Muscle pain   Buprenorphine     Mouth sores, confusion, shaking   Dilaudid [Hydromorphone Hcl] Hives   Fluoxetine Itching   Levofloxacin Other (See Comments)    Joint Pain   Morphine And Related    Other     Muscle pain   Suboxone [Buprenorphine Hcl-Naloxone Hcl] Other (See Comments)    Rash and confused   Vancomycin     Renal insufficiency       Latest Ref Rng & Units 06/28/2022    7:10 AM 06/24/2022    6:09 AM 06/23/2022    8:07 AM  CBC  WBC 4.0 - 10.5 K/uL 7.3  9.7  8.5   Hemoglobin 13.0 - 17.0 g/dL 9.9  10.3  10.2   Hematocrit 39.0 - 52.0 % 29.9  31.3  31.3   Platelets 150 - 400 K/uL 187  222  236       CMP     Component Value Date/Time   NA 140 06/28/2022 0538   NA 142 02/20/2022 1501   NA 139 08/12/2014 1019   K 4.3 06/28/2022 0538   K 3.9 08/12/2014 1019   CL 110 06/28/2022 0538   CL 103 08/12/2014 1019   CO2 22 06/28/2022 0538   CO2 27 08/12/2014 1019   GLUCOSE 98 06/28/2022 0538   GLUCOSE 100 (H) 08/12/2014 1019   BUN 76 (H) 06/28/2022 0538   BUN 52 (H) 02/20/2022 1501   BUN 17 08/12/2014 1019   CREATININE 5.12 (H) 06/28/2022 0538   CREATININE 1.55 (  H) 08/12/2014 1019   CALCIUM 8.3 (L) 06/28/2022 0538   CALCIUM 9.1 08/12/2014 1019   PROT 6.9 06/22/2022 2209   PROT 6.3 02/20/2022 1501   PROT 6.6 08/12/2014 1019   ALBUMIN 3.9 06/22/2022 2209   ALBUMIN 4.2 02/20/2022 1501   ALBUMIN 3.4 08/12/2014 1019   AST 18 06/22/2022 2209   AST 24 08/12/2014 1019   ALT 14 06/22/2022 2209   ALT 42 08/12/2014 1019   ALKPHOS 75 06/22/2022 2209   ALKPHOS 94 08/12/2014 1019   BILITOT 0.6 06/22/2022 2209   BILITOT <0.2 02/20/2022 1501   BILITOT 0.8 08/12/2014 1019   GFRNONAA 12 (L) 06/28/2022 0538   GFRNONAA 52 (L) 08/12/2014 1019    GFRAA 65 06/29/2020 1033   GFRAA >60 08/12/2014 1019     No results found.     Assessment & Plan:   1. ESRD (end stage renal disease) ([place]) Recommend:  The patient is experiencing increasing problems with their dialysis access.  Patient should have a fistulagram with the intention for intervention.  The intention for intervention is to restore appropriate flow and prevent thrombosis and possible loss of the access.  As well as improve the quality of dialysis therapy.  The risks, benefits and alternative therapies were reviewed in detail with the patient.  All questions were answered.  The patient agrees to proceed with angio/intervention.    The patient will follow up with me in the office after the procedure.    2. Hypertension associated with diabetes ([place]) Continue antihypertensive medications as already ordered, these medications have been reviewed and there are no changes at this time.   3. Type 2 diabetes mellitus with stage 5 chronic kidney disease not on chronic dialysis, without long-term current use of insulin (HCC) Continue hypoglycemic medications as already ordered, these medications have been reviewed and there are no changes at this time.  Hgb A1C to be monitored as already arranged by primary service    Current Outpatient Medications on File Prior to Visit  Medication Sig Dispense Refill   albuterol (VENTOLIN HFA) 108 (90 Base) MCG/ACT inhaler Inhale 1-2 puffs into the lungs every 6 (six) hours as needed for wheezing or shortness of breath. 18 g 0   amLODipine (NORVASC) 10 MG tablet Take 1 tablet (10 mg total) by mouth daily. 30 tablet 0   aspirin EC 81 MG tablet Take 1 tablet (81 mg total) by mouth daily. Swallow whole. 30 tablet 0   budesonide-formoterol (SYMBICORT) 160-4.5 MCG/ACT inhaler Inhale 2 puffs into the lungs 2 (two) times daily. 1 each 0   calcitRIOL (ROCALTROL) 0.25 MCG capsule Take 1 capsule (0.25 mcg total) by mouth daily. 30 capsule 0    carvedilol (COREG) 25 MG tablet Take 1 tablet (25 mg total) by mouth 2 (two) times daily with a meal. 60 tablet 0   DULoxetine (CYMBALTA) 60 MG capsule Take 1 capsule (60 mg total) by mouth daily. 30 capsule 0   ezetimibe (ZETIA) 10 MG tablet Take 1 tablet (10 mg total) by mouth daily. 30 tablet 0   glucose blood (ONETOUCH ULTRA) test strip USE TO TEST BLOOD SUGAR DAILY 100 each 3   hydrALAZINE (APRESOLINE) 10 MG tablet Take 1 tablet (10 mg total) by mouth 3 (three) times daily. 90 tablet 0   hydrOXYzine (ATARAX) 50 MG tablet TAKE ONE TABLET 3 TIMES DAILY AS NEEDED 20 tablet 0   nicotine (NICODERM CQ - DOSED IN MG/24 HOURS) 21 mg/24hr patch One patch chest wall daily (okay  to prescribe generic) 28 patch 0   pantoprazole (PROTONIX) 40 MG tablet Take 1 tablet (40 mg total) by mouth 2 (two) times daily as needed. 60 tablet 0   patiromer (VELTASSA) 8.4 g packet Take 1 packet (8.4 g total) by mouth every Monday, Wednesday, and Friday. 30 each 0   pregabalin (LYRICA) 50 MG capsule Take 1 capsule (50 mg total) by mouth daily. 30 capsule 0   topiramate (TOPAMAX) 50 MG tablet Take 1 tablet (50 mg total) by mouth 2 (two) times daily. 60 tablet 0   torsemide (DEMADEX) 20 MG tablet Take 2 tablets (40 mg total) by mouth daily. 60 tablet 0   traZODone (DESYREL) 100 MG tablet Take 1 tablet (100 mg total) by mouth at bedtime. 30 tablet 0   No current facility-administered medications on file prior to visit.    There are no Patient Instructions on file for this visit. No follow-ups on file.   Kris Hartmann, NP

## 2022-07-05 NOTE — H&P (View-Only) (Signed)
Subjective:    Patient ID: Derek Binet., male    DOB: [birth year], 56 y.o.   MRN: [id number] Chief Complaint  Patient presents with   Vascular Access Problem    Evaulate dialysis access    The patient returns to the office for followup of their dialysis access.   The patient underwent placement of a left radiocephalic AV fistula on 06/28/9469.  Patient does complain of some numbness of the thumb and forefinger.  He also has a wound on his access site.  It is scabbed over.  No significant drainage.  No signs symptoms of infection or cellulitis. The patient denies redness or swelling at the access site. The patient denies fever or chills at home or while on dialysis.  No recent shortening of the patient's walking distance or new symptoms consistent with claudication.  No history of rest pain symptoms. No new ulcers or wounds of the lower extremities have occurred.  The patient denies amaurosis fugax or recent TIA symptoms. There are no recent neurological changes noted. There is no history of DVT, PE or superficial thrombophlebitis. No recent episodes of angina or shortness of breath documented.   Duplex ultrasound of the AV access shows a patent access.  The patient has a flow volume of 735.  It is noted that there was a stricture within the mid forearm.    Review of Systems  Skin:  Positive for wound.  All other systems reviewed and are negative.      Objective:   Physical Exam Vitals reviewed.  HENT:     Head: Normocephalic.  Cardiovascular:     Rate and Rhythm: Normal rate.     Pulses:          Radial pulses are 1+ on the left side.     Arteriovenous access: Left arteriovenous access is present.    Comments: Good thrill and bruit Pulmonary:     Effort: Pulmonary effort is normal.  Skin:    General: Skin is warm and dry.  Neurological:     Mental Status: He is alert and oriented to person, place, and time.  Psychiatric:        Mood and Affect: Mood normal.         Behavior: Behavior normal.        Thought Content: Thought content normal.        Judgment: Judgment normal.     There were no vitals taken for this visit.  Past Medical History:  Diagnosis Date   Abdominal abscess    a.) chronic MRSA infection   Acute pancreatitis    Anxiety    Asthma    Brain aneurysm    a.) 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 b.) RIGHT crainotomy for clip ligation 05/14/2019.   Chronic back pain    Chronic heart failure with preserved ejection fraction (HFpEF) ([place])    a. 11/2021 Echo: EF 55-60%, no rwma, mod LVH, GrI DD. Nl RV size/fxn. Mildly dil LA.   CKD (chronic kidney disease), stage V (HCC)    Depression    Emphysema of lung (HCC)    Erectile dysfunction    Followed by palliative care service    GERD (gastroesophageal reflux disease)    History of methicillin resistant staphylococcus aureus (MRSA)    HLD (hyperlipidemia)    Hypertension    Mild cognitive impairment    MRSA (methicillin resistant Staphylococcus aureus)    Obesity  OSA (obstructive sleep apnea) 2013   a.) not compliant with nocturnal PAP therapy; CPAP machine "lost or stolen".   Panic disorder    Perforated bowel ([place]) 12/10/2005   Tempoary Colostomy Bag, Skin Graft for Abd wound   Polysubstance abuse ([place])    a.) ETOH, tobacco, marijuana, methamphetamines, cocaine, BZO, opioids.   Subarachnoid hemorrhage ([place]) 01/26/2010   a.) 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   T2DM (type 2 diabetes mellitus) ([place])    Tobacco abuse    Type 2 diabetes mellitus without complication, without long-term current use of insulin ([place]) 04/17/2016   Vitreous hemorrhage ([place]) 03/27/2011   Overview:  Bilateral; 01/2010, from [facility]     Social History   Socioeconomic History   Marital status: Divorced    Spouse name: Not on file   Number of  children: Not on file   Years of education: Not on file   Highest education level: Not on file  Occupational History   Not on file  Tobacco Use   Smoking status: Every Day    Packs/day: 0.50    Types: Cigarettes    Start date: 10/11/1984   Smokeless tobacco: Never  Vaping Use   Vaping Use: Never used  Substance and Sexual Activity   Alcohol use: Yes   Drug use: Not Currently    Types: "Crack" cocaine, Amphetamines, Methamphetamines, Benzodiazepines, Cocaine, Marijuana, Other-see comments    Comment: opioids   Sexual activity: Not Currently  Other Topics Concern   Not on file  Social History Narrative   Lives at home with sister.   Social Determinants of Health   Financial Resource Strain: Medium Risk (06/11/2022)   Overall Financial Resource Strain (CARDIA)    Difficulty of Paying Living Expenses: Somewhat hard  Food Insecurity: No Food Insecurity (06/11/2022)   Hunger Vital Sign    Worried About Running Out of Food in the Last Year: Never true    Ran Out of Food in the Last Year: Never true  Transportation Needs: Unmet Transportation Needs (06/11/2022)   PRAPARE - Hydrologist (Medical): Yes    Lack of Transportation (Non-Medical): No  Physical Activity: Inactive (06/11/2022)   Exercise Vital Sign    Days of Exercise per Week: 0 days    Minutes of Exercise per Session: 0 min  Stress: No Stress Concern Present (06/11/2022)   [place]    Feeling of Stress : Not at all  Social Connections: Socially Isolated (06/11/2022)   Social Connection and Isolation Panel [NHANES]    Frequency of Communication with Friends and Family: More than three times a week    Frequency of Social Gatherings with Friends and Family: More than three times a week    Attends Religious Services: Never    Marine scientist or Organizations: No    Attends Archivist Meetings: Never    Marital Status:  Divorced  Human resources officer Violence: Not At Risk (06/11/2022)   Humiliation, Afraid, Rape, and Kick questionnaire    Fear of Current or Ex-Partner: No    Emotionally Abused: No    Physically Abused: No    Sexually Abused: No    Past Surgical History:  Procedure Laterality Date   AV FISTULA PLACEMENT Left 05/24/2022   Procedure: ARTERIOVENOUS (AV) FISTULA CREATION ( RADIAL CEPHALIC);  Surgeon: Algernon Huxley, MD;  Location: [facility];  Service: Vascular;  Laterality: Left;   CEREBRAL ANEURYSM REPAIR Right 01/26/2010   Procedure: CEREBRAL ANEURYSM REPAIR (COIL EMBOLIZATION)   CEREBRAL ANEURYSM REPAIR Right 05/14/2019   Procedure: CRAINOTOMY FOR CEREBRAL ANEURYSM REPAIR (CLIP LIGATION)   COLON SURGERY  12/10/2005   colostomy bag placed s/p perforated bowel   COLONOSCOPY WITH PROPOFOL N/A 05/18/2020   Procedure: COLONOSCOPY WITH PROPOFOL;  Surgeon: Lin Landsman, MD;  Location: [place];  Service: Gastroenterology;  Laterality: N/A;   DIALYSIS/PERMA CATHETER INSERTION N/A 06/27/2022   Procedure: DIALYSIS/PERMA CATHETER INSERTION;  Surgeon: Katha Cabal, MD;  Location: [facility];  Service: Cardiovascular;  Laterality: N/A;   KNEE SURGERY Right    Perforated bowel      Family History  Problem Relation Age of Onset   Arthritis Mother    Asthma Mother    Diabetes Mother    Heart disease Mother    Hyperlipidemia Mother    Hypertension Mother    Kidney disease Mother    Thyroid disease Mother    Lung disease Mother    Anxiety disorder Mother    Depression Mother    Diabetes Father    Heart disease Father    Depression Father    Anxiety disorder Father    Arthritis Sister    Asthma Sister    Hyperlipidemia Sister    Hypertension Sister    Lung disease Sister    Anxiety disorder Sister    Depression Sister    Hyperlipidemia Brother    Hypertension Brother    Diabetes Sister    Heart disease Sister    Depression Sister    Anxiety disorder Sister     Anxiety disorder Brother    Depression Brother    Heart disease Brother     Allergies  Allergen Reactions   Atorvastatin     Joint Aches - Severe Joint Aches - Severe Joint Aches - Severe   Avelox [Moxifloxacin Hcl In Nacl]     Muscle pain   Buprenorphine     Mouth sores, confusion, shaking   Dilaudid [Hydromorphone Hcl] Hives   Fluoxetine Itching   Levofloxacin Other (See Comments)    Joint Pain   Morphine And Related    Other     Muscle pain   Suboxone [Buprenorphine Hcl-Naloxone Hcl] Other (See Comments)    Rash and confused   Vancomycin     Renal insufficiency       Latest Ref Rng & Units 06/28/2022    7:10 AM 06/24/2022    6:09 AM 06/23/2022    8:07 AM  CBC  WBC 4.0 - 10.5 K/uL 7.3  9.7  8.5   Hemoglobin 13.0 - 17.0 g/dL 9.9  10.3  10.2   Hematocrit 39.0 - 52.0 % 29.9  31.3  31.3   Platelets 150 - 400 K/uL 187  222  236       CMP     Component Value Date/Time   NA 140 06/28/2022 0538   NA 142 02/20/2022 1501   NA 139 08/12/2014 1019   K 4.3 06/28/2022 0538   K 3.9 08/12/2014 1019   CL 110 06/28/2022 0538   CL 103 08/12/2014 1019   CO2 22 06/28/2022 0538   CO2 27 08/12/2014 1019   GLUCOSE 98 06/28/2022 0538   GLUCOSE 100 (H) 08/12/2014 1019   BUN 76 (H) 06/28/2022 0538   BUN 52 (H) 02/20/2022 1501   BUN 17 08/12/2014 1019   CREATININE 5.12 (H) 06/28/2022 0538   CREATININE 1.55 (  H) 08/12/2014 1019   CALCIUM 8.3 (L) 06/28/2022 0538   CALCIUM 9.1 08/12/2014 1019   PROT 6.9 06/22/2022 2209   PROT 6.3 02/20/2022 1501   PROT 6.6 08/12/2014 1019   ALBUMIN 3.9 06/22/2022 2209   ALBUMIN 4.2 02/20/2022 1501   ALBUMIN 3.4 08/12/2014 1019   AST 18 06/22/2022 2209   AST 24 08/12/2014 1019   ALT 14 06/22/2022 2209   ALT 42 08/12/2014 1019   ALKPHOS 75 06/22/2022 2209   ALKPHOS 94 08/12/2014 1019   BILITOT 0.6 06/22/2022 2209   BILITOT <0.2 02/20/2022 1501   BILITOT 0.8 08/12/2014 1019   GFRNONAA 12 (L) 06/28/2022 0538   GFRNONAA 52 (L) 08/12/2014 1019    GFRAA 65 06/29/2020 1033   GFRAA >60 08/12/2014 1019     No results found.     Assessment & Plan:   1. ESRD (end stage renal disease) ([place]) Recommend:  The patient is experiencing increasing problems with their dialysis access.  Patient should have a fistulagram with the intention for intervention.  The intention for intervention is to restore appropriate flow and prevent thrombosis and possible loss of the access.  As well as improve the quality of dialysis therapy.  The risks, benefits and alternative therapies were reviewed in detail with the patient.  All questions were answered.  The patient agrees to proceed with angio/intervention.    The patient will follow up with me in the office after the procedure.    2. Hypertension associated with diabetes ([place]) Continue antihypertensive medications as already ordered, these medications have been reviewed and there are no changes at this time.   3. Type 2 diabetes mellitus with stage 5 chronic kidney disease not on chronic dialysis, without long-term current use of insulin (HCC) Continue hypoglycemic medications as already ordered, these medications have been reviewed and there are no changes at this time.  Hgb A1C to be monitored as already arranged by primary service    Current Outpatient Medications on File Prior to Visit  Medication Sig Dispense Refill   albuterol (VENTOLIN HFA) 108 (90 Base) MCG/ACT inhaler Inhale 1-2 puffs into the lungs every 6 (six) hours as needed for wheezing or shortness of breath. 18 g 0   amLODipine (NORVASC) 10 MG tablet Take 1 tablet (10 mg total) by mouth daily. 30 tablet 0   aspirin EC 81 MG tablet Take 1 tablet (81 mg total) by mouth daily. Swallow whole. 30 tablet 0   budesonide-formoterol (SYMBICORT) 160-4.5 MCG/ACT inhaler Inhale 2 puffs into the lungs 2 (two) times daily. 1 each 0   calcitRIOL (ROCALTROL) 0.25 MCG capsule Take 1 capsule (0.25 mcg total) by mouth daily. 30 capsule 0    carvedilol (COREG) 25 MG tablet Take 1 tablet (25 mg total) by mouth 2 (two) times daily with a meal. 60 tablet 0   DULoxetine (CYMBALTA) 60 MG capsule Take 1 capsule (60 mg total) by mouth daily. 30 capsule 0   ezetimibe (ZETIA) 10 MG tablet Take 1 tablet (10 mg total) by mouth daily. 30 tablet 0   glucose blood (ONETOUCH ULTRA) test strip USE TO TEST BLOOD SUGAR DAILY 100 each 3   hydrALAZINE (APRESOLINE) 10 MG tablet Take 1 tablet (10 mg total) by mouth 3 (three) times daily. 90 tablet 0   hydrOXYzine (ATARAX) 50 MG tablet TAKE ONE TABLET 3 TIMES DAILY AS NEEDED 20 tablet 0   nicotine (NICODERM CQ - DOSED IN MG/24 HOURS) 21 mg/24hr patch One patch chest wall daily (okay  to prescribe generic) 28 patch 0   pantoprazole (PROTONIX) 40 MG tablet Take 1 tablet (40 mg total) by mouth 2 (two) times daily as needed. 60 tablet 0   patiromer (VELTASSA) 8.4 g packet Take 1 packet (8.4 g total) by mouth every Monday, Wednesday, and Friday. 30 each 0   pregabalin (LYRICA) 50 MG capsule Take 1 capsule (50 mg total) by mouth daily. 30 capsule 0   topiramate (TOPAMAX) 50 MG tablet Take 1 tablet (50 mg total) by mouth 2 (two) times daily. 60 tablet 0   torsemide (DEMADEX) 20 MG tablet Take 2 tablets (40 mg total) by mouth daily. 60 tablet 0   traZODone (DESYREL) 100 MG tablet Take 1 tablet (100 mg total) by mouth at bedtime. 30 tablet 0   No current facility-administered medications on file prior to visit.    There are no Patient Instructions on file for this visit. No follow-ups on file.   Kris Hartmann, NP

## 2022-07-06 ENCOUNTER — Encounter (INDEPENDENT_AMBULATORY_CARE_PROVIDER_SITE_OTHER): Payer: Self-pay | Admitting: Nurse Practitioner

## 2022-07-06 ENCOUNTER — Telehealth (INDEPENDENT_AMBULATORY_CARE_PROVIDER_SITE_OTHER): Payer: Self-pay

## 2022-07-06 NOTE — Telephone Encounter (Signed)
Spoke with the patient's sister and he is scheduled with Dr. Lucky Cowboy on 07/16/22 with a 6:45 am arrival time to the [facility] for a left arm fistulagram. Pre-procedure instructions were discussed and will be mailed.

## 2022-07-09 ENCOUNTER — Institutional Professional Consult (permissible substitution): Payer: Medicare Other | Admitting: Primary Care

## 2022-07-10 ENCOUNTER — Ambulatory Visit: Payer: Medicare Other | Admitting: Family

## 2022-07-10 NOTE — Progress Notes (Deleted)
   Patient ID: Derek Cooley., male    DOB: 1966-11-12, 56 y.o.   MRN: [id number]  HPI  Derek Cooley is a 56 y/o male with a history of  Echo report from 06/23/22 reviewed and showed an EF of 60-65% along with mild LVH  Admitted 06/23/22 due to   Review of Systems    Physical Exam    Assessment & Plan:  1: Chronic heart failure with preserved ejection fraction with structural changes (LVH)- - NYHA class

## 2022-07-16 ENCOUNTER — Ambulatory Visit
Admission: RE | Admit: 2022-07-16 | Discharge: 2022-07-16 | Disposition: A | Discharge status: Home / Self Care | Payer: Medicare Other | Attending: Vascular Surgery | Admitting: Vascular Surgery

## 2022-07-16 ENCOUNTER — Encounter: Payer: Self-pay | Admitting: Vascular Surgery

## 2022-07-16 ENCOUNTER — Encounter
Admission: RE | Disposition: A | Discharge status: Home / Self Care | Payer: Self-pay | Source: Home / Self Care | Attending: Vascular Surgery

## 2022-07-16 ENCOUNTER — Other Ambulatory Visit: Payer: Self-pay

## 2022-07-16 DIAGNOSIS — E1122 Type 2 diabetes mellitus with diabetic chronic kidney disease: Secondary | ICD-10-CM | POA: Insufficient documentation

## 2022-07-16 DIAGNOSIS — I5032 Chronic diastolic (congestive) heart failure: Secondary | ICD-10-CM | POA: Diagnosis not present

## 2022-07-16 DIAGNOSIS — Y841 Kidney dialysis as the cause of abnormal reaction of the patient, or of later complication, without mention of misadventure at the time of the procedure: Secondary | ICD-10-CM | POA: Insufficient documentation

## 2022-07-16 DIAGNOSIS — N186 End stage renal disease: Secondary | ICD-10-CM

## 2022-07-16 DIAGNOSIS — F17210 Nicotine dependence, cigarettes, uncomplicated: Secondary | ICD-10-CM | POA: Diagnosis not present

## 2022-07-16 DIAGNOSIS — Z992 Dependence on renal dialysis: Secondary | ICD-10-CM | POA: Insufficient documentation

## 2022-07-16 DIAGNOSIS — T82858A Stenosis of vascular prosthetic devices, implants and grafts, initial encounter: Secondary | ICD-10-CM | POA: Insufficient documentation

## 2022-07-16 DIAGNOSIS — Z95828 Presence of other vascular implants and grafts: Secondary | ICD-10-CM

## 2022-07-16 DIAGNOSIS — I132 Hypertensive heart and chronic kidney disease with heart failure and with stage 5 chronic kidney disease, or end stage renal disease: Secondary | ICD-10-CM | POA: Diagnosis not present

## 2022-07-16 HISTORY — PX: A/V FISTULAGRAM: CATH118298

## 2022-07-16 LAB — POTASSIUM (ARMC VASCULAR LAB ONLY): Potassium (ARMC vascular lab): 4 mmol/L (ref 3.5–5.1)

## 2022-07-16 LAB — GLUCOSE, CAPILLARY: Glucose-Capillary: 131 mg/dL — ABNORMAL HIGH (ref 70–99)

## 2022-07-16 SURGERY — A/V FISTULAGRAM
Anesthesia: Moderate Sedation | Laterality: Left

## 2022-07-16 MED ORDER — SODIUM CHLORIDE 0.9 % IV SOLN
INTRAVENOUS | Status: DC
Start: 2022-07-16 — End: 2022-07-16

## 2022-07-16 MED ORDER — MIDAZOLAM HCL 2 MG/ML PO SYRP: 8.0000 mg | ORAL_SOLUTION | Freq: Once | ORAL | Status: DC | PRN

## 2022-07-16 MED ORDER — METHYLPREDNISOLONE SODIUM SUCC 125 MG IJ SOLR: 125.0000 mg | Freq: Once | INTRAMUSCULAR | Status: DC | PRN

## 2022-07-16 MED ORDER — MIDAZOLAM HCL 2 MG/2ML IJ SOLN
INTRAMUSCULAR | Status: DC | PRN
  Administered 2022-07-16: 2 mg via INTRAVENOUS

## 2022-07-16 MED ORDER — CEFAZOLIN SODIUM-DEXTROSE 1-4 GM/50ML-% IV SOLN: 1.0000 g | INTRAVENOUS | Status: AC

## 2022-07-16 MED ORDER — HEPARIN SODIUM (PORCINE) 1000 UNIT/ML IJ SOLN
INTRAMUSCULAR | Status: DC | PRN
  Administered 2022-07-16: 3000 [IU] via INTRAVENOUS

## 2022-07-16 MED ORDER — MIDAZOLAM HCL 2 MG/2ML IJ SOLN
INTRAMUSCULAR | Status: AC
  Filled 2022-07-16: qty 4

## 2022-07-16 MED ORDER — FAMOTIDINE 20 MG PO TABS: 40.0000 mg | ORAL_TABLET | Freq: Once | ORAL | Status: DC | PRN

## 2022-07-16 MED ORDER — FENTANYL CITRATE (PF) 100 MCG/2ML IJ SOLN
INTRAMUSCULAR | Status: DC | PRN
  Administered 2022-07-16: 50 ug via INTRAVENOUS

## 2022-07-16 MED ORDER — IODIXANOL 320 MG/ML IV SOLN
INTRAVENOUS | Status: DC | PRN
  Administered 2022-07-16: 20 mL

## 2022-07-16 MED ORDER — FENTANYL CITRATE (PF) 100 MCG/2ML IJ SOLN: 12.5000 ug | Freq: Once | INTRAMUSCULAR | Status: DC | PRN

## 2022-07-16 MED ORDER — HEPARIN SODIUM (PORCINE) 1000 UNIT/ML IJ SOLN
INTRAMUSCULAR | Status: AC
  Filled 2022-07-16: qty 10

## 2022-07-16 MED ORDER — ONDANSETRON HCL 4 MG/2ML IJ SOLN: 4.0000 mg | Freq: Four times a day (QID) | INTRAMUSCULAR | Status: DC | PRN

## 2022-07-16 MED ORDER — CEFAZOLIN SODIUM-DEXTROSE 2-4 GM/100ML-% IV SOLN: 2.0000 g | INTRAVENOUS | Status: DC

## 2022-07-16 MED ORDER — DIPHENHYDRAMINE HCL 50 MG/ML IJ SOLN: 50.0000 mg | Freq: Once | INTRAMUSCULAR | Status: DC | PRN

## 2022-07-16 MED ORDER — CEFAZOLIN SODIUM-DEXTROSE 1-4 GM/50ML-% IV SOLN
INTRAVENOUS | Status: AC
  Administered 2022-07-16: 1 g via INTRAVENOUS
  Filled 2022-07-16: qty 50

## 2022-07-16 MED ORDER — FENTANYL CITRATE PF 50 MCG/ML IJ SOSY
PREFILLED_SYRINGE | INTRAMUSCULAR | Status: AC
  Filled 2022-07-16: qty 2

## 2022-07-16 SURGICAL SUPPLY — 10 items
BALLN LUTONIX DCB 6X80X130 (BALLOONS) ×2
BALLOON LUTONIX DCB 6X80X130 (BALLOONS) IMPLANT
CANNULA 5F STIFF (CANNULA) ×1 IMPLANT
COVER PROBE U/S 5X48 (MISCELLANEOUS) ×1 IMPLANT
DRAPE BRACHIAL (DRAPES) ×1 IMPLANT
GLIDEWIRE ADV .035X180CM (WIRE) ×1 IMPLANT
KIT ENCORE 26 ADVANTAGE (KITS) ×1 IMPLANT
PACK ANGIOGRAPHY (CUSTOM PROCEDURE TRAY) ×2 IMPLANT
SHEATH BRITE TIP 6FRX5.5 (SHEATH) ×1 IMPLANT
SUT MNCRL AB 4-0 PS2 18 (SUTURE) ×1 IMPLANT

## 2022-07-16 NOTE — Op Note (Signed)
[facility]    OPERATIVE NOTE   PROCEDURE: 1.   Left radiocephalic arteriovenous fistula cannulation under ultrasound guidance 2.   Left arm fistulagram including central venogram 3.   Percutaneous transluminal angioplasty of the left cephalic vein at the antecubital fossa with 6 mm diameter by 8 cm length Lutonix drug-coated angioplasty balloon  PRE-OPERATIVE DIAGNOSIS: 1. ESRD 2. Poorly functional left radiocephalic AVF  POST-OPERATIVE DIAGNOSIS: same as above   SURGEON: Leotis Pain, MD  ANESTHESIA: local with MCS  ESTIMATED BLOOD LOSS: 5 cc  FINDING(S): The forearm cephalic vein was patent, but at the antecubital fossa the cephalic vein had a high-grade stenosis in the 80 to 90% range.  There was additional outflow through the basilic vein but this was essentially retrograde through a branch and then roundabout making it less efficient so the cephalic vein should have been the dominant outflow in the upper arm.  The remainder of the upper arm cephalic vein was patent and drained into a patent axillary and subclavian vein.  SPECIMEN(S):  None  CONTRAST: 20 cc  FLUORO TIME: 1.7 minutes  MODERATE CONSCIOUS SEDATION TIME: Approximately 20 minutes with 2 mg of Versed and 50 mcg of Fentanyl   INDICATIONS: Derek Cooley. is a 56 y.o. male who presents with malfunctioning left radiocephalic arteriovenous fistula.  The patient is scheduled for left arm fistulagram.  The patient is aware the risks include but are not limited to: bleeding, infection, thrombosis of the cannulated access, and possible anaphylactic reaction to the contrast.  The patient is aware of the risks of the procedure and elects to proceed forward.  DESCRIPTION: After full informed written consent was obtained, the patient was brought back to the angiography suite and placed supine upon the angiography table.  The patient was connected to monitoring equipment. Moderate conscious sedation was  administered with a face to face encounter with the patient throughout the procedure with my supervision of the RN administering medicines and monitoring the patient's vital signs and mental status throughout from the start of the procedure until the patient was taken to the recovery room. The left arm was prepped and draped in the standard fashion for a percutaneous access intervention.  Under ultrasound guidance, the left radiocephalic arteriovenous fistula was cannulated with a micropuncture needle under direct ultrasound guidance where it was patent and a permanent image was performed.  The microwire was advanced into the fistula and the needle was exchanged for the a microsheath.  I then upsized to a 6 Fr Sheath and imaging was performed.  Hand injections were completed to image the access including the central venous system. This demonstrated that the forearm cephalic vein was patent, but at the antecubital fossa the cephalic vein had a high-grade stenosis in the 80 to 90% range.  There was additional outflow through the basilic vein but this was essentially retrograde through a branch and then roundabout making it less efficient so the cephalic vein should have been the dominant outflow in the upper arm.  The remainder of the upper arm cephalic vein was patent and drained into a patent axillary and subclavian vein.  Based on the images, this patient will need intervention to the cephalic vein outflow stenosis. I then gave the patient 3000 units of intravenous heparin.  I then crossed the stenosis with an advantage wire.  Based on the imaging, a 6 mm x 8 cm Lutonix drug-coated angioplasty balloon was selected.  The balloon was centered around the antecubital  and distal upper arm cephalic vein stenosis and inflated to 8 ATM for 1 minute(s).  On completion imaging, a 10% residual stenosis was present.     Based on the completion imaging, no further intervention is necessary.  The wire and balloon were  removed from the sheath.  A 4-0 Monocryl purse-string suture was sewn around the sheath.  The sheath was removed while tying down the suture.  A sterile bandage was applied to the puncture site.  COMPLICATIONS: None  CONDITION: Stable   Leotis Pain  07/16/2022 8:49 AM   This note was created with Dragon Medical transcription system. Any errors in dictation are purely unintentional.

## 2022-07-16 NOTE — Interval H&P Note (Signed)
History and Physical Interval Note:  07/16/2022 8:06 AM  Derek Cooley.  has presented today for surgery, with the diagnosis of LT arm fistulagram   End Stage Renal.  The various methods of treatment have been discussed with the patient and family. After consideration of risks, benefits and other options for treatment, the patient has consented to  Procedure(s): A/V Fistulagram (Left) as a surgical intervention.  The patient's history has been reviewed, patient examined, no change in status, stable for surgery.  I have reviewed the patient's chart and labs.  Questions were answered to the patient's satisfaction.     Leotis Pain

## 2022-07-17 ENCOUNTER — Encounter: Payer: Self-pay | Admitting: Unknown Physician Specialty

## 2022-07-17 ENCOUNTER — Ambulatory Visit (INDEPENDENT_AMBULATORY_CARE_PROVIDER_SITE_OTHER): Payer: Medicare Other | Admitting: Unknown Physician Specialty

## 2022-07-17 DIAGNOSIS — E1122 Type 2 diabetes mellitus with diabetic chronic kidney disease: Secondary | ICD-10-CM | POA: Diagnosis not present

## 2022-07-17 DIAGNOSIS — I5033 Acute on chronic diastolic (congestive) heart failure: Secondary | ICD-10-CM

## 2022-07-17 DIAGNOSIS — N185 Chronic kidney disease, stage 5: Secondary | ICD-10-CM

## 2022-07-17 NOTE — Assessment & Plan Note (Signed)
See above.  Diet controlled

## 2022-07-17 NOTE — Assessment & Plan Note (Signed)
As above.  On dialysis

## 2022-07-17 NOTE — Progress Notes (Signed)
BP 136/79   Pulse 76   Temp 99 F (37.2 C) (Oral)   Ht 5' 8.11" (1.73 m)   Wt 222 lb 6.4 oz (100.9 kg)   SpO2 97%   BMI 33.71 kg/m    Subjective:    Patient ID: Derek Cooley., male    DOB: Aug 14, 1966, 56 y.o.   MRN: [id number]  HPI: Derek Cooley. is a 56 y.o. male  Chief Complaint  Patient presents with   Hospitalization Follow-up    D/C'd 7/24 for COPD and emergency dialysis   Hospital follow-up discharged July 25th .  Admitted from the ER on 7/6 for heart failure.   Hx also significant Htn, hyperlipidemia, DM, depression, anemia, tobacco and substance abuse, OSA, pancreatitis, s/p brain aneurysim,   Dialysis initiated during this hospitalization.  Currently on 3L of nasal cannula and receiving outpatient dialysis.  Doing well with SOB with activity.    Discharge note reviewed.    Relevant past medical, surgical, family and social history reviewed and updated as indicated. Interim medical history since our last visit reviewed. Allergies and medications reviewed and updated.  Review of Systems  Per HPI unless specifically indicated above     Objective:    BP 136/79   Pulse 76   Temp 99 F (37.2 C) (Oral)   Ht 5' 8.11" (1.73 m)   Wt 222 lb 6.4 oz (100.9 kg)   SpO2 97%   BMI 33.71 kg/m   Wt Readings from Last 3 Encounters:  07/17/22 222 lb 6.4 oz (100.9 kg)  07/16/22 220 lb (99.8 kg)  07/02/22 218 lb 14.7 oz (99.3 kg)    Physical Exam Constitutional:      General: He is not in acute distress.    Appearance: Normal appearance. He is well-developed.  HENT:     Head: Normocephalic and atraumatic.  Eyes:     General: Lids are normal. No scleral icterus.       Right eye: No discharge.        Left eye: No discharge.     Conjunctiva/sclera: Conjunctivae normal.  Neck:     Vascular: No carotid bruit or JVD.  Cardiovascular:     Rate and Rhythm: Normal rate and regular rhythm.     Heart sounds: Normal heart sounds.  Pulmonary:     Effort:  Pulmonary effort is normal. No respiratory distress.     Breath sounds: Normal breath sounds.     Comments: On O2 Abdominal:     Palpations: There is no hepatomegaly or splenomegaly.  Musculoskeletal:        General: Normal range of motion.     Cervical back: Normal range of motion and neck supple.  Skin:    General: Skin is warm and dry.     Coloration: Skin is not pale.     Findings: No rash.  Neurological:     Mental Status: He is alert and oriented to person, place, and time.  Psychiatric:        Behavior: Behavior normal.        Thought Content: Thought content normal.        Judgment: Judgment normal.     Results for orders placed or performed during the hospital encounter of 07/16/22  Potassium [facility] vascular lab only)  Result Value Ref Range   Potassium [facility] vascular lab) 4 3.5 - 5.1 mmol/L  Glucose, capillary  Result Value Ref Range   Glucose-Capillary 131 (H) 70 - 99 mg/dL  Assessment & Plan:   Problem List Items Addressed This Visit   None   CKD (chronic kidney disease), stage V ([place]) Getting outpatient dialysis.  Will continue 3 days/week 4 hours/day   Acute on chronic diastolic CHF (congestive heart failure) ([place]) SOB doing well but goes down with exertion.  Continue Coreg, hydralazine, torsemide   COPD Taking Albuterol and Symbicort (only one a day).  Encouraged to take twice a day  Acute on chronic respiratory failure with hypoxia (HCC) O2 levels are good on 3L O2.  Takes O2 off at times.     Hypertension Continue home losartan, Coreg, oral hydralazine     Tobacco abuse "Less than before"   Type II diabetes mellitus with renal manifestations ([place]) Diet-controlled diabetes.  Recent A1c 5.3, well controlled.   Depression with anxiety Continue with Cymbalta   Anemia in CKD (chronic kidney disease) Last hemoglobin 9.9    Obstructive sleep apnea Patient states this CPAP was stolen.  Outpatient CPAP is being arranged   CHF Secondary to  fluid overload as in need of dialysis  Hyperlipidemia Statin intolerant.  I can't document CVD but consider PSK9.  Discuss with cardiology next month  Follow up plan:  Recheck in 3 months

## 2022-07-19 ENCOUNTER — Ambulatory Visit: Payer: Medicare Other | Admitting: Medical

## 2022-07-19 ENCOUNTER — Encounter: Payer: Self-pay | Admitting: Medical

## 2022-07-19 VITALS — BP 144/82 | HR 75 | Ht 67.5 in | Wt 220.0 lb

## 2022-07-19 DIAGNOSIS — Z992 Dependence on renal dialysis: Secondary | ICD-10-CM

## 2022-07-19 DIAGNOSIS — I248 Other forms of acute ischemic heart disease: Secondary | ICD-10-CM

## 2022-07-19 DIAGNOSIS — J449 Chronic obstructive pulmonary disease, unspecified: Secondary | ICD-10-CM

## 2022-07-19 DIAGNOSIS — E785 Hyperlipidemia, unspecified: Secondary | ICD-10-CM | POA: Diagnosis not present

## 2022-07-19 DIAGNOSIS — I5032 Chronic diastolic (congestive) heart failure: Secondary | ICD-10-CM

## 2022-07-19 DIAGNOSIS — I2489 Other forms of acute ischemic heart disease: Secondary | ICD-10-CM

## 2022-07-19 DIAGNOSIS — I2510 Atherosclerotic heart disease of native coronary artery without angina pectoris: Secondary | ICD-10-CM

## 2022-07-19 DIAGNOSIS — N186 End stage renal disease: Secondary | ICD-10-CM

## 2022-07-19 DIAGNOSIS — I130 Hypertensive heart and chronic kidney disease with heart failure and stage 1 through stage 4 chronic kidney disease, or unspecified chronic kidney disease: Secondary | ICD-10-CM | POA: Diagnosis not present

## 2022-07-19 DIAGNOSIS — E781 Pure hyperglyceridemia: Secondary | ICD-10-CM

## 2022-07-19 MED ORDER — REPATHA SURECLICK 140 MG/ML ~~LOC~~ SOAJ: 140.0000 mg | SUBCUTANEOUS | 11 refills | Status: DC

## 2022-07-19 NOTE — Progress Notes (Signed)
Cardiology Office Note:    Date:  07/19/2022   ID:  Derek Binet., DOB 1966-05-31, MRN [id number]  PCP:  Jon Billings, NP  [facility] Cardiologist:  Kathlyn Sacramento, MD  [facility] Electrophysiologist:  None   Referring MD: Jon Billings, NP   Chief Complaint: Hospital follow-up  History of Present Illness:    Derek Binet. is a 56 y.o. male with a hx of HTN, diabetes mellitus, subarachnoid hemorrhage s/p craniotomy and clipping, sleep apnea, obesity, CKD stage IV, abdominal abscess with chronic MRSA infection, and polysubstance abuse.    In December 2022 he presented to [facility] with progressive dyspnea and was found to be markedly hypertensive. Labs showed normaocytic anemia with imld troponin elevated to a peak of 18. Echo showed normal LVEF with moderate LVH and G1DD. He was discharged home on amlodipine and and Coreg with recommendation for outpatient stress testing. He was readmitted to [facility] 16 in the setting of increased somnolence. He was found to have rhinovirus and worsening AKI. HE was noncompliant with his medications at home and amlodipine and coreg were resumed. ER visit January 27 with headaches and elevated BP. Labs showed stable CKD. MRI was unremarkable. He was discharged home.    Seen in follow-up and BP was very high and he was treated in the office with clonidine. Ischemic testing was deferred given elevated BP.    Seen 01/2822 and he was started on hydralazine 50mg . This was increased at the follow-up visit.   Admitted 06/23/22-7/24 for SOB and LLE, BP was elevated. CXR showed vascular congestion and interstitial edema. Troponins were mildly elevated. Home BP meds were restarted and he was diuresed with IV lasix. Myoview showed no high risk ischemia. Nephrology decided to initiate dialysis during hospital stay so a Perm cath was placed. He was sent home on torsemide 40mg  for non dialysis days.   Today, the patient reports he is doing OK. He is  wearing 3L O2. They are interested in PCSK9i. He is going back to see pulmonary. Myoview, echo were reviewed. He is taking Torsemide 40mg  on the days he doesn't have dialysis. He makes minimal urine. He had some chest pain, suspect it was more indigestion. Also has long standing left shoulder injury. BP is a little high, but didn't have medications today. It's better at dialysis.    Past Medical History:  Diagnosis Date   Abdominal abscess    a.) chronic MRSA infection   Acute pancreatitis    Anxiety    Asthma    Brain aneurysm    a.) 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 b.) RIGHT crainotomy for clip ligation 05/14/2019.   Chronic back pain    Chronic heart failure with preserved ejection fraction (HFpEF) ([place])    a. 11/2021 Echo: EF 55-60%, no rwma, mod LVH, GrI DD. Nl RV size/fxn. Mildly dil LA.   CKD (chronic kidney disease), stage V (HCC)    Depression    Emphysema of lung (HCC)    Erectile dysfunction    Followed by palliative care service    GERD (gastroesophageal reflux disease)    History of methicillin resistant staphylococcus aureus (MRSA)    HLD (hyperlipidemia)    Hypertension    Mild cognitive impairment    MRSA (methicillin resistant Staphylococcus aureus)    Obesity    OSA (obstructive sleep apnea) 2013   a.) not compliant with nocturnal PAP therapy; CPAP machine "lost  or stolen".   Panic disorder    Perforated bowel ([place]) 12/10/2005   Tempoary Colostomy Bag, Skin Graft for Abd wound   Polysubstance abuse ([place])    a.) ETOH, tobacco, marijuana, methamphetamines, cocaine, BZO, opioids.   Subarachnoid hemorrhage ([place]) 01/26/2010   a.) 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   T2DM (type 2 diabetes mellitus) ([place])    Tobacco abuse    Type 2 diabetes mellitus without complication, without long-term current use of insulin  ([place]) 04/17/2016   Vitreous hemorrhage ([place]) 03/27/2011   Overview:  Bilateral; 01/2010, from [facility]     Past Surgical History:  Procedure Laterality Date   A/V FISTULAGRAM Left 07/16/2022   Procedure: A/V Fistulagram;  Surgeon: Algernon Huxley, MD;  Location: [place] [facility];  Service: Cardiovascular;  Laterality: Left;   AV FISTULA PLACEMENT Left 05/24/2022   Procedure: ARTERIOVENOUS (AV) FISTULA CREATION ( RADIAL CEPHALIC);  Surgeon: Algernon Huxley, MD;  Location: [facility] ORS;  Service: Vascular;  Laterality: Left;   CEREBRAL ANEURYSM REPAIR Right 01/26/2010   Procedure: CEREBRAL ANEURYSM REPAIR (COIL EMBOLIZATION)   CEREBRAL ANEURYSM REPAIR Right 05/14/2019   Procedure: CRAINOTOMY FOR CEREBRAL ANEURYSM REPAIR (CLIP LIGATION)   COLON SURGERY  12/10/2005   colostomy bag placed s/p perforated bowel   COLONOSCOPY WITH PROPOFOL N/A 05/18/2020   Procedure: COLONOSCOPY WITH PROPOFOL;  Surgeon: Lin Landsman, MD;  Location: [place];  Service: Gastroenterology;  Laterality: N/A;   DIALYSIS/PERMA CATHETER INSERTION N/A 06/27/2022   Procedure: DIALYSIS/PERMA CATHETER INSERTION;  Surgeon: Katha Cabal, MD;  Location: [place] [facility];  Service: Cardiovascular;  Laterality: N/A;   KNEE SURGERY Right    Perforated bowel      Current Medications: Current Meds  Medication Sig   albuterol (VENTOLIN HFA) 108 (90 Base) MCG/ACT inhaler Inhale 1-2 puffs into the lungs every 6 (six) hours as needed for wheezing or shortness of breath.   amLODipine (NORVASC) 10 MG tablet Take 1 tablet (10 mg total) by mouth daily.   aspirin EC 81 MG tablet Take 1 tablet (81 mg total) by mouth daily. Swallow whole.   budesonide-formoterol (SYMBICORT) 160-4.5 MCG/ACT inhaler Inhale 2 puffs into the lungs 2 (two) times daily.   calcitRIOL (ROCALTROL) 0.25 MCG capsule Take 1 capsule (0.25 mcg total) by mouth daily.   carvedilol (COREG) 25 MG tablet Take 1 tablet (25 mg total) by mouth 2 (two) times daily with a  meal.   DULoxetine (CYMBALTA) 60 MG capsule Take 1 capsule (60 mg total) by mouth daily.   Evolocumab (REPATHA SURECLICK) 782 MG/ML SOAJ Inject 140 mg into the skin every 14 (fourteen) days.   ezetimibe (ZETIA) 10 MG tablet Take 1 tablet (10 mg total) by mouth daily.   glucose blood (ONETOUCH ULTRA) test strip USE TO TEST BLOOD SUGAR DAILY   hydrALAZINE (APRESOLINE) 10 MG tablet Take 1 tablet (10 mg total) by mouth 3 (three) times daily.   hydrOXYzine (ATARAX) 50 MG tablet TAKE ONE TABLET 3 TIMES DAILY AS NEEDED   OXYGEN Inhale 3 L into the lungs daily.   pantoprazole (PROTONIX) 40 MG tablet Take 1 tablet (40 mg total) by mouth 2 (two) times daily as needed.   patiromer Daryll Drown) 8.4 g packet Take 1 packet (8.4 g total) by mouth every Monday, Wednesday, and Friday.   pregabalin (LYRICA) 50 MG capsule Take 1 capsule (50 mg total) by mouth daily.   silver sulfADIAZINE (SILVADENE) 1 %  cream Apply 1 Application topically daily.   topiramate (TOPAMAX) 50 MG tablet Take 1 tablet (50 mg total) by mouth 2 (two) times daily.   torsemide (DEMADEX) 20 MG tablet Take 2 tablets (40 mg total) by mouth daily.   traZODone (DESYREL) 100 MG tablet Take 1 tablet (100 mg total) by mouth at bedtime.     Allergies:   Atorvastatin, Avelox [moxifloxacin hcl in nacl], Buprenorphine, Dilaudid [hydromorphone hcl], Fluoxetine, Levofloxacin, Morphine and related, Other, Suboxone [buprenorphine hcl-naloxone hcl], and Vancomycin   Social History   Socioeconomic History   Marital status: Divorced    Spouse name: Not on file   Number of children: Not on file   Years of education: Not on file   Highest education level: Not on file  Occupational History   Not on file  Tobacco Use   Smoking status: Every Day    Packs/day: 0.50    Types: Cigarettes    Start date: 10/11/1984   Smokeless tobacco: Never  Vaping Use   Vaping Use: Never used  Substance and Sexual Activity   Alcohol use: Yes   Drug use: Not Currently     Types: "Crack" cocaine, Amphetamines, Methamphetamines, Benzodiazepines, Cocaine, Marijuana, Other-see comments    Comment: opioids   Sexual activity: Not Currently  Other Topics Concern   Not on file  Social History Narrative   Lives alone and has nursing assistance help that lives in.   Social Determinants of Health   Financial Resource Strain: Medium Risk (06/11/2022)   Overall Financial Resource Strain (CARDIA)    Difficulty of Paying Living Expenses: Somewhat hard  Food Insecurity: No Food Insecurity (06/11/2022)   Hunger Vital Sign    Worried About Running Out of Food in the Last Year: Never true    Ran Out of Food in the Last Year: Never true  Transportation Needs: Unmet Transportation Needs (06/11/2022)   PRAPARE - Hydrologist (Medical): Yes    Lack of Transportation (Non-Medical): No  Physical Activity: Inactive (06/11/2022)   Exercise Vital Sign    Days of Exercise per Week: 0 days    Minutes of Exercise per Session: 0 min  Stress: No Stress Concern Present (06/11/2022)   [place]    Feeling of Stress : Not at all  Social Connections: Socially Isolated (06/11/2022)   Social Connection and Isolation Panel [NHANES]    Frequency of Communication with Friends and Family: More than three times a week    Frequency of Social Gatherings with Friends and Family: More than three times a week    Attends Religious Services: Never    Marine scientist or Organizations: No    Attends Music therapist: Never    Marital Status: Divorced     Family History: The patient's family history includes Anxiety disorder in his brother, father, mother, sister, and sister; Arthritis in his mother and sister; Asthma in his mother and sister; Depression in his brother, father, mother, sister, and sister; Diabetes in his father, mother, and sister; Heart disease in his brother, father, mother,  and sister; Hyperlipidemia in his brother, mother, and sister; Hypertension in his brother, mother, and sister; Kidney disease in his mother; Lung disease in his mother and sister; Thyroid disease in his mother.  ROS:   Please see the history of present illness.     All other systems reviewed and are negative.  EKGs/Labs/Other Studies Reviewed:  The following studies were reviewed today:    TTE 06/23/2022: 1. Left ventricular ejection fraction, by estimation, is 60 to 65%. The  left ventricle has normal function. The left ventricle has no regional  wall motion abnormalities. There is mild left ventricular hypertrophy.  Left ventricular diastolic parameters  were normal.   2. Right ventricular systolic function is normal. The right ventricular  size is normal.   3. The mitral valve is normal in structure. No evidence of mitral valve  regurgitation.   4. The aortic valve is tricuspid. Aortic valve regurgitation is not  visualized. Aortic valve sclerosis is present, with no evidence of aortic  valve stenosis.    Myoview Stress Test 06/26/22:   There is no evidence of significant ischemia or scar, though evaluation of the inferior wall is quite limited due to extracardiac activity and diaphragmatic attenuation.   Left ventricular systolic function is normal by visual estimation.  Calculated LVEF or 40-45% is likely artifactually low due to significant extracardiac activity.   Mild coronary artery calcification is noted on the attenuation correction CT.   Faint hazy opacity is noted in the right upper lobe adjacent to the major fissure of uncertain clinical significance.  Dedicated chest CT could be obtained for further evaluation, as clinically indicated.  EKG:  EKG is ordered today.  The ekg ordered today demonstrates NSR 75bpm, nonspecific T wave changes  Recent Labs: 11/25/2021: B Natriuretic Peptide 549.9 06/22/2022: ALT 14 06/25/2022: Magnesium 2.4 06/28/2022: BUN 76; Creatinine,  Ser 5.12; Hemoglobin 9.9; Platelets 187; Potassium 4.3; Sodium 140  Recent Lipid Panel    Component Value Date/Time   CHOL 201 (H) 06/23/2022 0807   CHOL 161 04/17/2021 1548   TRIG 83 06/23/2022 0807   HDL 47 06/23/2022 0807   HDL 24 (L) 04/17/2021 1548   CHOLHDL 4.3 06/23/2022 0807   VLDL 17 06/23/2022 0807   LDLCALC 137 (H) 06/23/2022 0807   LDLCALC 104 (H) 04/17/2021 1548    Physical Exam:    VS:  BP (!) 144/82 (BP Location: Right Arm, Patient Position: Sitting, Cuff Size: Normal)   Pulse 75   Ht 5' 7.5" (1.715 m)   Wt 220 lb (99.8 kg)   SpO2 96% Comment: Oxygen @ 3 Liters  BMI 33.95 kg/m     Wt Readings from Last 3 Encounters:  07/19/22 220 lb (99.8 kg)  07/17/22 222 lb 6.4 oz (100.9 kg)  07/16/22 220 lb (99.8 kg)     GEN:  Well nourished, well developed in no acute distress HEENT: Normal NECK: No JVD; No carotid bruits LYMPHATICS: No lymphadenopathy CARDIAC: RRR, no murmurs, rubs, gallops RESPIRATORY:  Clear to auscultation without rales, wheezing or rhonchi  ABDOMEN: Soft, non-tender, non-distended MUSCULOSKELETAL:  No edema; No deformity  SKIN: Warm and dry NEUROLOGIC:  Alert and oriented x 3 PSYCHIATRIC:  Normal affect   ASSESSMENT:    1. Chronic heart failure with preserved ejection fraction ([place])   2. Demand ischemia ([place])   3. Hypertensive heart and chronic kidney disease with heart failure and stage 1 through stage 4 chronic kidney disease, or chronic kidney disease ([place])   4. Hyperlipidemia, unspecified hyperlipidemia type   5. Hypertriglyceridemia   6. Chronic diastolic heart failure ([place])   7. Coronary artery disease involving native coronary artery of native heart without angina pectoris   8. Chronic obstructive pulmonary disease, unspecified COPD type ([place])   9. ESRD (end stage renal disease) on dialysis ([place])    PLAN:    In  order of problems listed above:  HFpEF SOB/LLE Echo in the hospital showed LVEF 60-65%. Volume is now mainly managed  by dialysis. He takes Torsemide on the days he doesn't have dialysis. He is euvolemic on exam today. Continue BB therapy.   HTN BP is mildly elevated today, but he didn't have his medications. It is generally better at dialysis. Continue amlodipine 10mg  daily, Hydralazine 10mg  TID and Coreg 25mg BID.   Atypical chest pain Recent Myoview was low risk with no significant ischemia. It showed mild coronary artery calcifications. He has some recurrent chest discomfort that is likely from acid-reflux. Continue Aspirin, Zetia and BB therapy. No further ischemic work-up at this time.   COPD He is now on 3L O2. He has upcoming appointment with Pulmonology.  ESRD on HD Tobacco use Followed by nephrology. HD on T, Th, Sat. He is still smoking.   HLD LDL 137. He wants to try PCSK9i, so we will start Derek Cooley. Continue Zetia.   Disposition: Follow up in 3 month(s) with MD/APP    Signed, Leomar Westberg Ninfa Meeker, PA-C  07/19/2022 3:03 PM    [facility]

## 2022-07-19 NOTE — Patient Instructions (Addendum)
Medication Instructions:  Your physician has recommended you make the following change in your medication:   START Repatha 140 mg every 14 days  *If you need a refill on your cardiac medications before your next appointment, please call your pharmacy*   Lab Work: None  If you have labs (blood work) drawn today and your tests are completely normal, you will receive your results only by: [place] (if you have MyChart) OR A paper copy in the mail If you have any lab test that is abnormal or we need to change your treatment, we will call you to review the results.   Testing/Procedures: None   Follow-Up: At [facility], you and your health needs are our priority.  As part of our continuing mission to provide you with exceptional heart care, we have created designated Provider Care Teams.  These Care Teams include your primary Cardiologist (physician) and Advanced Practice Providers (APPs -  Physician Assistants and Nurse Practitioners) who all work together to provide you with the care you need, when you need it.   Your next appointment:   3 month(s)  The format for your next appointment:   In Person  Provider:   Kathlyn Sacramento, MD or Cadence Kathlen Mody, [place]       Important Information About Sugar

## 2022-07-20 ENCOUNTER — Telehealth: Payer: Self-pay

## 2022-07-20 NOTE — Telephone Encounter (Signed)
Prior Authorization sent for Repatha: Abby Stines (Key: [id number]) Rx #: [id number] Repatha SureClick 140MG /ML auto-injectors   Form [employer] Medicare Part D Electronic Prior Authorization Form (2017 NCPDP)  Waiting for approval

## 2022-07-20 NOTE — Telephone Encounter (Signed)
Melrose Nakayama (KeyJen Mow) - [id number] Repatha SureClick 140MG /ML auto-injectors Status: PA Response - ApprovedCreated: August 10th, 2023 [phone]: August 11th, 2023 Patient and pharmacy notified.

## 2022-07-24 ENCOUNTER — Other Ambulatory Visit: Payer: Medicare Other | Admitting: Primary Care

## 2022-08-02 ENCOUNTER — Other Ambulatory Visit: Payer: Self-pay | Admitting: Nurse Practitioner

## 2022-08-02 DIAGNOSIS — I129 Hypertensive chronic kidney disease with stage 1 through stage 4 chronic kidney disease, or unspecified chronic kidney disease: Secondary | ICD-10-CM

## 2022-08-02 NOTE — Telephone Encounter (Signed)
Medication Refill - Medication:  amLODipine (NORVASC) 10 MG tablet, calcitRIOL (ROCALTROL) 0.25 MCG capsule, carvedilol (COREG) 25 MG tablet, DULoxetine (CYMBALTA) 60 MG capsule, hydrALAZINE (APRESOLINE) 10 MG tablet, hydrOXYzine (ATARAX) 50 MG tablet, pantoprazole (PROTONIX) 40 MG tablet, pregabalin (LYRICA) 50 MG capsule, topiramate (TOPAMAX) 50 MG tablet, torsemide (DEMADEX) 20 MG tablet  Has the patient contacted their pharmacy? Yes.     Preferred Pharmacy (with phone number or street name):  [facility], [address] Phone:  [phone]  Fax:  [phone]     Has the patient been seen for an appointment in the last year OR does the patient have an upcoming appointment? Yes.    The sister of the patient Derek Cooley handles everything for the patient and states he is about out of all of his medications. He last got them when he came out of the hospital. Please assist patient further.

## 2022-08-02 NOTE — Telephone Encounter (Signed)
Requested medication (s) are due for refill today - yes  Requested medication (s) are on the active medication list -yes  Future visit scheduled -yes  Last refill: 06/26/22 #30  Notes to clinic: outside provider- sent for PCP review   Requested Prescriptions  Pending Prescriptions Disp Refills   ezetimibe (ZETIA) 10 MG tablet 30 tablet 0    Sig: Take 1 tablet (10 mg total) by mouth daily.     Cardiovascular:  Antilipid - Sterol Transport Inhibitors Failed - 08/02/2022 10:09 AM      Failed - Lipid Panel in normal range within the last 12 months    Cholesterol, Total  Date Value Ref Range Status  04/17/2021 161 100 - 199 mg/dL Final   Cholesterol  Date Value Ref Range Status  06/23/2022 201 (H) 0 - 200 mg/dL Final   LDL Chol Calc (NIH)  Date Value Ref Range Status  04/17/2021 104 (H) 0 - 99 mg/dL Final   LDL Cholesterol  Date Value Ref Range Status  06/23/2022 137 (H) 0 - 99 mg/dL Final    Comment:           Total Cholesterol/HDL:CHD Risk Coronary Heart Disease Risk Table                     Men   Women  1/2 Average Risk   3.4   3.3  Average Risk       5.0   4.4  2 X Average Risk   9.6   7.1  3 X Average Risk  23.4   11.0        Use the calculated Patient Ratio above and the CHD Risk Table to determine the patient's CHD Risk.        ATP III CLASSIFICATION (LDL):  <100     mg/dL   Optimal  100-129  mg/dL   Near or Above                    Optimal  130-159  mg/dL   Borderline  160-189  mg/dL   High  >190     mg/dL   Very High Performed at [facility], [place], [place] [address]    HDL  Date Value Ref Range Status  06/23/2022 47 >40 mg/dL Final  04/17/2021 24 (L) >39 mg/dL Final   Triglycerides  Date Value Ref Range Status  06/23/2022 83 <150 mg/dL Final         Passed - AST in normal range and within 360 days    AST  Date Value Ref Range Status  06/22/2022 18 15 - 41 U/L Final   SGOT(AST)  Date Value Ref Range Status   08/12/2014 24 15 - 37 Unit/L Final         Passed - ALT in normal range and within 360 days    ALT  Date Value Ref Range Status  06/22/2022 14 0 - 44 U/L Final   SGPT (ALT)  Date Value Ref Range Status  08/12/2014 42 U/L Final    Comment:    14-63 NOTE: New Reference Range 06/29/14          Passed - Patient is not pregnant      Passed - Valid encounter within last 12 months    Recent Outpatient Visits           2 weeks ago Acute on chronic diastolic CHF (congestive heart failure) ([place])   [facility] Kathrine Haddock, NP   6 months ago Hypertension associated with diabetes [facility])   [facility] Jon Billings, NP   7 months ago Hospital discharge follow-up   [facility] Jon Billings, NP   1 year ago Hypertension associated with diabetes [facility])   [facility] Jon Billings, NP   2 years ago Chronic anxiety   [place], [place], [place]       Future Appointments             In 2 weeks Jon Billings, NP [facility], [facility]   In 1 month  [facility] [place], [facility]   In 2 months Furth, Cadence H, PA-C [place], [facility]               Requested Prescriptions  Pending Prescriptions Disp Refills   ezetimibe (ZETIA) 10 MG tablet 30 tablet 0    Sig: Take 1 tablet (10 mg total) by mouth daily.     Cardiovascular:  Antilipid - Sterol Transport Inhibitors Failed - 08/02/2022 10:09 AM      Failed - Lipid Panel in normal range within the last 12 months    Cholesterol, Total  Date Value Ref Range Status  04/17/2021 161 100 - 199 mg/dL Final   Cholesterol  Date Value Ref Range Status  06/23/2022 201 (H) 0 - 200 mg/dL Final   LDL Chol Calc (NIH)  Date Value Ref Range Status  04/17/2021 104 (H) 0 - 99 mg/dL Final   LDL Cholesterol  Date Value Ref Range Status  06/23/2022 137 (H) 0 - 99 mg/dL Final    Comment:           Total  Cholesterol/HDL:CHD Risk Coronary Heart Disease Risk Table                     Men   Women  1/2 Average Risk   3.4   3.3  Average Risk       5.0   4.4  2 X Average Risk   9.6   7.1  3 X Average Risk  23.4   11.0        Use the calculated Patient Ratio above and the CHD Risk Table to determine the patient's CHD Risk.        ATP III CLASSIFICATION (LDL):  <100     mg/dL   Optimal  100-129  mg/dL   Near or Above                    Optimal  130-159  mg/dL   Borderline  160-189  mg/dL   High  >190     mg/dL   Very High Performed at [facility], [place], [place] [address]    HDL  Date Value Ref Range Status  06/23/2022 47 >40 mg/dL Final  04/17/2021 24 (L) >39 mg/dL Final   Triglycerides  Date Value Ref Range Status  06/23/2022 83 <150 mg/dL Final         Passed - AST in normal range and within 360 days    AST  Date Value Ref Range Status  06/22/2022 18 15 - 41 U/L Final   SGOT(AST)  Date Value Ref Range Status  08/12/2014 24 15 - 37 Unit/L Final         Passed - ALT in normal range and within 360 days    ALT  Date Value Ref Range Status  06/22/2022 14 0 - 44 U/L Final   SGPT (ALT)  Date Value Ref Range Status  08/12/2014 42 U/L Final    Comment:    14-63 NOTE: New Reference Range 06/29/14          Passed - Patient is not pregnant      Passed - Valid encounter within last 12 months    Recent Outpatient Visits           2 weeks ago Acute on chronic diastolic CHF (congestive heart failure) ([place])   [facility] Kathrine Haddock, NP   6 months ago Hypertension associated with diabetes [facility])   [facility] Jon Billings, NP   7 months ago Hospital discharge follow-up   [facility] Jon Billings, NP   1 year ago Hypertension associated with diabetes [facility])   [facility] Jon Billings, NP   2 years ago Chronic anxiety   [place], Lilia [place], [place]        Future Appointments             In 2 weeks Jon Billings, NP [facility], [place]   In 1 month  [facility], [facility]   In 2 months Furth, [place], PA-C [place], [facility]

## 2022-08-02 NOTE — Telephone Encounter (Signed)
Requested medication (s) are due for refill today: Yes  Requested medication (s) are on the active medication list: Yes  Last refill:    Future visit scheduled: Yes  Notes to clinic:  Medications last filled by Dr. Loletha Grayer.    Requested Prescriptions  Pending Prescriptions Disp Refills   amLODipine (NORVASC) 10 MG tablet 30 tablet 0    Sig: Take 1 tablet (10 mg total) by mouth daily.     Cardiovascular: Calcium Channel Blockers 2 Failed - 08/02/2022 10:08 AM      Failed - Last BP in normal range    BP Readings from Last 1 Encounters:  07/19/22 (!) 144/82         Passed - Last Heart Rate in normal range    Pulse Readings from Last 1 Encounters:  07/19/22 75         Passed - Valid encounter within last 6 months    Recent Outpatient Visits           2 weeks ago Acute on chronic diastolic CHF (congestive heart failure) ([place])   [facility] Kathrine Haddock, NP   6 months ago Hypertension associated with diabetes ([place])   [facility] Jon Billings, NP   7 months ago Hospital discharge follow-up   [facility] Jon Billings, NP   1 year ago Hypertension associated with diabetes [facility])   [facility] Jon Billings, NP   2 years ago Chronic anxiety   [facility] Volney American, [place]       Future Appointments             In 2 weeks Jon Billings, NP [facility], [facility]   In 1 month  [facility], [facility]   In 2 months Furth, Cadence H, PA-C [facility], [facility]             calcitRIOL (ROCALTROL) 0.25 MCG capsule 30 capsule 0    Sig: Take 1 capsule (0.25 mcg total) by mouth daily.     Endocrinology:  Vitamins - Vitamin D Supplementation - calcitriol Failed - 08/02/2022 10:08 AM      Failed - PTH in normal range and within 360 days    PTH Interp  Date Value Ref Range Status  12/07/2021 Comment  Final    Comment:    Interpretation                  Intact PTH    Calcium                                 (pg/mL)      (mg/dL) Normal                          15 - 65     8.6 - 10.2 Primary Hyperparathyroidism         >65          >10.2 Secondary Hyperparathyroidism       >65          <10.2 Non-Parathyroid Hypercalcemia       <65          >10.2 Hypoparathyroidism                  <15          < 8.6 Non-Parathyroid Hypocalcemia    15 - 65          <  8.6    PTH  Date Value Ref Range Status  12/07/2021 135 (H) 15 - 65 pg/mL Final         Failed - Ca in normal range and within 360 days    Calcium  Date Value Ref Range Status  06/28/2022 8.3 (L) 8.9 - 10.3 mg/dL Final   Calcium, Total  Date Value Ref Range Status  08/12/2014 9.1 8.5 - 10.1 mg/dL Final         Passed - Phosphate in normal range and within 360 days    Phosphorus  Date Value Ref Range Status  11/28/2021 4.2 2.5 - 4.6 mg/dL Final         Passed - Valid encounter within last 12 months    Recent Outpatient Visits           2 weeks ago Acute on chronic diastolic CHF (congestive heart failure) ([place])   [place] Kathrine Haddock, NP   6 months ago Hypertension associated with diabetes ([place])   [facility] Jon Billings, NP   7 months ago Hospital discharge follow-up   [facility] Bill[facility], NP   1 year ago Hypertension associated with diabetes ([place])   [facility] Jon Billings, NP   2 years ago Chronic anxiety   [place], Lilia Argue, [place]       Future Appointments             In 2 weeks Jon Billings, NP [facility], [facility]   In 1 month  [facility] [place], [facility]   In 2 months Furth, Cadence H, PA-C [facility], [facility]             carvedilol (COREG) 25 MG tablet 60 tablet 0    Sig: Take 1 tablet (25 mg total) by mouth 2 (two) times daily with a meal.     Cardiovascular: Beta Blockers 3 Failed - 08/02/2022  10:08 AM      Failed - Cr in normal range and within 360 days    Creatinine  Date Value Ref Range Status  08/12/2014 1.55 (H) 0.60 - 1.30 mg/dL Final   Creatinine, Ser  Date Value Ref Range Status  06/28/2022 5.12 (H) 0.61 - 1.24 mg/dL Final   Creatinine, Urine  Date Value Ref Range Status  11/26/2021 133 mg/dL Final         Failed - Last BP in normal range    BP Readings from Last 1 Encounters:  07/19/22 (!) 144/82         Passed - AST in normal range and within 360 days    AST  Date Value Ref Range Status  06/22/2022 18 15 - 41 U/L Final   SGOT(AST)  Date Value Ref Range Status  08/12/2014 24 15 - 37 Unit/L Final         Passed - ALT in normal range and within 360 days    ALT  Date Value Ref Range Status  06/22/2022 14 0 - 44 U/L Final   SGPT (ALT)  Date Value Ref Range Status  08/12/2014 42 U/L Final    Comment:    14-63 NOTE: New Reference Range 06/29/14          Passed - Last Heart Rate in normal range    Pulse Readings from Last 1 Encounters:  07/19/22 75         Passed - Valid encounter within last 6 months    Recent Outpatient  Visits           2 weeks ago Acute on chronic diastolic CHF (congestive heart failure) ([place])   [facility] Kathrine Haddock, NP   6 months ago Hypertension associated with diabetes [facility])   [facility] Jon Billings, NP   7 months ago Hospital discharge follow-up   [facility] Jon Billings, NP   1 year ago Hypertension associated with diabetes [facility])   [facility] Jon Billings, NP   2 years ago Chronic anxiety   [facility] Volney American, [place]       Future Appointments             In 2 weeks Jon Billings, NP [facility], [facility]   In 1 month  [facility], [facility]   In 2 months Furth, Cadence H, PA-C [facility], [facility]             DULoxetine (CYMBALTA) 60 MG capsule 30 capsule 0     Sig: Take 1 capsule (60 mg total) by mouth daily.     Psychiatry: Antidepressants - SNRI - duloxetine Failed - 08/02/2022 10:08 AM      Failed - Cr in normal range and within 360 days    Creatinine  Date Value Ref Range Status  08/12/2014 1.55 (H) 0.60 - 1.30 mg/dL Final   Creatinine, Ser  Date Value Ref Range Status  06/28/2022 5.12 (H) 0.61 - 1.24 mg/dL Final   Creatinine, Urine  Date Value Ref Range Status  11/26/2021 133 mg/dL Final         Failed - eGFR is 30 or above and within 360 days    EGFR (African American)  Date Value Ref Range Status  08/12/2014 >60  Final   GFR calc Af Amer  Date Value Ref Range Status  06/29/2020 65 >59 mL/min/1.73 Final    Comment:    **[facility] currently reports eGFR in compliance with the current**   recommendations of the [employer]. [facility] will   update reporting as new guidelines are published from the NKF-ASN   Task force.    EGFR (Non-African Amer.)  Date Value Ref Range Status  08/12/2014 52 (L)  Final    Comment:    eGFR values <58m/min/1.73 m2 may be an indication of chronic kidney disease (CKD). Calculated eGFR is useful in patients with stable renal function. The eGFR calculation will not be reliable in acutely ill patients when serum creatinine is changing rapidly. It is not useful in  patients on dialysis. The eGFR calculation may not be applicable to patients at the low and high extremes of body sizes, pregnant women, and vegetarians.    GFR, Estimated  Date Value Ref Range Status  06/28/2022 12 (L) >60 mL/min Final    Comment:    (NOTE) Calculated using the CKD-EPI Creatinine Equation (2021)    eGFR  Date Value Ref Range Status  02/20/2022 14 (L) >59 mL/min/1.73 Final         Failed - Last BP in normal range    BP Readings from Last 1 Encounters:  07/19/22 (!) 144/82         Passed - Completed PHQ-2 or PHQ-9 in the last 360 days      Passed - Valid encounter within last 6 months     Recent Outpatient Visits           2 weeks ago Acute on chronic diastolic CHF (congestive heart failure) ([place]  [facility] Kathrine Haddock, NP   6 months ago Hypertension associated with diabetes [facility])   [facility] Jon Billings, NP   7 months ago Hospital discharge follow-up   [facility] Jon Billings, NP   1 year ago Hypertension associated with diabetes [facility])   [facility] Jon Billings, NP   2 years ago Chronic anxiety   [facility] Volney American, [place]       Future Appointments             In 2 weeks Jon Billings, NP [facility] [place]   In 1 month  [facility] [place], [facility]   In 2 months Furth, Cadence H, PA-C [place], [facility]             hydrOXYzine (ATARAX) 50 MG tablet 20 tablet 0    Sig: TAKE ONE TABLET 3 TIMES DAILY AS NEEDED     Ear, Nose, and Throat:  Antihistamines 2 Failed - 08/02/2022 10:08 AM      Failed - Cr in normal range and within 360 days    Creatinine  Date Value Ref Range Status  08/12/2014 1.55 (H) 0.60 - 1.30 mg/dL Final   Creatinine, Ser  Date Value Ref Range Status  06/28/2022 5.12 (H) 0.61 - 1.24 mg/dL Final   Creatinine, Urine  Date Value Ref Range Status  11/26/2021 133 mg/dL Final         Passed - Valid encounter within last 12 months    Recent Outpatient Visits           2 weeks ago Acute on chronic diastolic CHF (congestive heart failure) ([place])   [facility] Kathrine Haddock, NP   6 months ago Hypertension associated with diabetes ([place])   [facility] Jon Billings, NP   7 months ago Hospital discharge follow-up   [facility] Jon Billings, NP   1 year ago Hypertension associated with diabetes [facility])   [facility] Jon Billings, NP   2 years ago Chronic anxiety   [facility] Volney American, [place]       Future  Appointments             In 2 weeks Jon Billings, NP [facility], [facility]   In 1 month  [facility] [place], [facility]   In 2 months Furth, Cadence H, PA-C [place], [facility]             pantoprazole (PROTONIX) 40 MG tablet 60 tablet 0    Sig: Take 1 tablet (40 mg total) by mouth 2 (two) times daily as needed.     Gastroenterology: Proton Pump Inhibitors Passed - 08/02/2022 10:08 AM      Passed - Valid encounter within last 12 months    Recent Outpatient Visits           2 weeks ago Acute on chronic diastolic CHF (congestive heart failure) ([place])   [facility] Kathrine Haddock, NP   6 months ago Hypertension associated with diabetes [place])   [facility] Jon Billings, NP   7 months ago Hospital discharge follow-up   [facility] Jon Billings, NP   1 year ago Hypertension associated with diabetes [facility])   [facility] Jon Billings, NP   2 years ago Chronic anxiety   [place], Lilia Argue, [place]       Future Appointments  In 2 weeks Jon Billings, NP [facility], [facility]   In 1 month  [facility] [place], [facility]   In 2 months Furth, Cadence H, PA-C [facility], [facility]             pregabalin (LYRICA) 50 MG capsule 30 capsule 0    Sig: Take 1 capsule (50 mg total) by mouth daily.     Not Delegated - Neurology:  Anticonvulsants - Controlled - pregabalin Failed - 08/02/2022 10:08 AM      Failed - This refill cannot be delegated      Failed - Cr in normal range and within 360 days    Creatinine  Date Value Ref Range Status  08/12/2014 1.55 (H) 0.60 - 1.30 mg/dL Final   Creatinine, Ser  Date Value Ref Range Status  06/28/2022 5.12 (H) 0.61 - 1.24 mg/dL Final   Creatinine, Urine  Date Value Ref Range Status  11/26/2021 133 mg/dL Final         Passed - Completed PHQ-2 or PHQ-9 in the last 360  days      Passed - Valid encounter within last 12 months    Recent Outpatient Visits           2 weeks ago Acute on chronic diastolic CHF (congestive heart failure) ([place])   [facility] Kathrine Haddock, NP   6 months ago Hypertension associated with diabetes ([place])   [facility] Jon Billings, NP   7 months ago Hospital discharge follow-up   [facility] Jon Billings, NP   1 year ago Hypertension associated with diabetes [facility])   [facility] Jon Billings, NP   2 years ago Chronic anxiety   [facility] Volney American, [place]       Future Appointments             In 2 weeks Jon Billings, NP [facility], [facility]   In 1 month  [facility] [place], [facility]   In 2 months Furth, Cadence H, PA-C [facility], [facility]             topiramate (TOPAMAX) 50 MG tablet 60 tablet 0    Sig: Take 1 tablet (50 mg total) by mouth 2 (two) times daily.     Neurology: Anticonvulsants - topiramate & zonisamide Failed - 08/02/2022 10:08 AM      Failed - Cr in normal range and within 360 days    Creatinine  Date Value Ref Range Status  08/12/2014 1.55 (H) 0.60 - 1.30 mg/dL Final   Creatinine, Ser  Date Value Ref Range Status  06/28/2022 5.12 (H) 0.61 - 1.24 mg/dL Final   Creatinine, Urine  Date Value Ref Range Status  11/26/2021 133 mg/dL Final         Passed - CO2 in normal range and within 360 days    CO2  Date Value Ref Range Status  06/28/2022 22 22 - 32 mmol/L Final   Co2  Date Value Ref Range Status  08/12/2014 27 21 - 32 mmol/L Final   Bicarbonate  Date Value Ref Range Status  07/01/2022 27.1 20.0 - 28.0 mmol/L Final         Passed - ALT in normal range and within 360 days    ALT  Date Value Ref Range Status  06/22/2022 14 0 - 44 U/L Final   SGPT (ALT)  Date Value Ref Range Status  08/12/2014 42 U/L Final    Comment:  14-63 NOTE: New Reference  Range 06/29/14          Passed - AST in normal range and within 360 days    AST  Date Value Ref Range Status  06/22/2022 18 15 - 41 U/L Final   SGOT(AST)  Date Value Ref Range Status  08/12/2014 24 15 - 37 Unit/L Final         Passed - Completed PHQ-2 or PHQ-9 in the last 360 days      Passed - Valid encounter within last 12 months    Recent Outpatient Visits           2 weeks ago Acute on chronic diastolic CHF (congestive heart failure) ([place])   [facility] Kathrine Haddock, NP   6 months ago Hypertension associated with diabetes ([place])   [facility] Jon Billings, NP   7 months ago Hospital discharge follow-up   [facility] Jon Billings, NP   1 year ago Hypertension associated with diabetes [facility])   [facility] Jon Billings, NP   2 years ago Chronic anxiety   [place], [place], [place]       Future Appointments             In 2 weeks Jon Billings, NP [facility], [facility]   In 1 month  [facility] [place], [facility]   In 2 months Furth, Cadence H, PA-C [place], [facility]             torsemide (DEMADEX) 20 MG tablet 60 tablet 0    Sig: Take 2 tablets (40 mg total) by mouth daily.     Cardiovascular:  Diuretics - Loop Failed - 08/02/2022 10:08 AM      Failed - Ca in normal range and within 180 days    Calcium  Date Value Ref Range Status  06/28/2022 8.3 (L) 8.9 - 10.3 mg/dL Final   Calcium, Total  Date Value Ref Range Status  08/12/2014 9.1 8.5 - 10.1 mg/dL Final         Failed - Cr in normal range and within 180 days    Creatinine  Date Value Ref Range Status  08/12/2014 1.55 (H) 0.60 - 1.30 mg/dL Final   Creatinine, Ser  Date Value Ref Range Status  06/28/2022 5.12 (H) 0.61 - 1.24 mg/dL Final   Creatinine, Urine  Date Value Ref Range Status  11/26/2021 133 mg/dL Final         Failed - Last BP in normal range    BP  Readings from Last 1 Encounters:  07/19/22 (!) 144/82         Passed - K in normal range and within 180 days    Potassium  Date Value Ref Range Status  06/28/2022 4.3 3.5 - 5.1 mmol/L Final    Comment:    HEMOLYSIS AT THIS LEVEL MAY AFFECT RESULT  08/12/2014 3.9 3.5 - 5.1 mmol/L Final   Potassium [facility] vascular lab)  Date Value Ref Range Status  07/16/2022 4 3.5 - 5.1 mmol/L Final    Comment:    Performed at [facility], [address] [place], [place] [address]         Passed - Na in normal range and within 180 days    Sodium  Date Value Ref Range Status  06/28/2022 140 135 - 145 mmol/L Final  02/20/2022 142 134 - 144 mmol/L Final  08/12/2014 139 136 - 145 mmol/L Final  Passed - Cl in normal range and within 180 days    Chloride  Date Value Ref Range Status  06/28/2022 110 98 - 111 mmol/L Final  08/12/2014 103 98 - 107 mmol/L Final         Passed - Mg Level in normal range and within 180 days    Magnesium  Date Value Ref Range Status  06/25/2022 2.4 1.7 - 2.4 mg/dL Final    Comment:    Performed at [facility], [address], [place], [place] [address]         Passed - Valid encounter within last 6 months    Recent Outpatient Visits           2 weeks ago Acute on chronic diastolic CHF (congestive heart failure) ([place])   [facility] Kathrine Haddock, NP   6 months ago Hypertension associated with diabetes ([place])   [facility] Jon Billings, NP   7 months ago Hospital discharge follow-up   [facility] Jon Billings, NP   1 year ago Hypertension associated with diabetes [facility])   [facility] Jon Billings, NP   2 years ago Chronic anxiety   [facility] Volney American, [place]       Future Appointments             In 2 weeks Jon Billings, NP [facility], [facility]   In 1 month  [facility] [place], [facility]   In 2 months Furth, Cadence  H, PA-C [facility], [facility]             hydrALAZINE (APRESOLINE) 10 MG tablet 90 tablet 0    Sig: Take 1 tablet (10 mg total) by mouth 3 (three) times daily.     Cardiovascular:  Vasodilators Failed - 08/02/2022 10:08 AM      Failed - HCT in normal range and within 360 days    HCT  Date Value Ref Range Status  06/28/2022 29.9 (L) 39.0 - 52.0 % Final   Hematocrit  Date Value Ref Range Status  02/02/2022 32.7 (L) 37.5 - 51.0 % Final         Failed - HGB in normal range and within 360 days    Hemoglobin  Date Value Ref Range Status  06/28/2022 9.9 (L) 13.0 - 17.0 g/dL Final  02/02/2022 11.2 (L) 13.0 - 17.7 g/dL Final         Failed - RBC in normal range and within 360 days    RBC  Date Value Ref Range Status  06/28/2022 3.39 (L) 4.22 - 5.81 MIL/uL Final         Failed - Last BP in normal range    BP Readings from Last 1 Encounters:  07/19/22 (!) 144/82         Passed - WBC in normal range and within 360 days    WBC  Date Value Ref Range Status  06/28/2022 7.3 4.0 - 10.5 K/uL Final         Passed - PLT in normal range and within 360 days    Platelets  Date Value Ref Range Status  06/28/2022 187 150 - 400 K/uL Final  02/02/2022 290 150 - 450 x10E3/uL Final         Passed - ANA Screen, Ifa, Serum in normal range and within 360 days    Anti Nuclear Antibody (ANA)  Date Value Ref Range Status  11/26/2021 Negative Negative Final    Comment:    (  NOTE) Performed At: [facility] [place], [place] [id number] Rush Farmer MD [id number]          LTEIHD - Valid encounter within last 12 months    Recent Outpatient Visits           2 weeks ago Acute on chronic diastolic CHF (congestive heart failure) ([place])   [facility] Kathrine Haddock, NP   6 months ago Hypertension associated with diabetes [facility])   [facility] Jon Billings, NP   7 months ago Hospital discharge follow-up   [facility] Jon Billings, NP   1 year ago Hypertension associated with diabetes [facility])   [facility] Jon Billings, NP   2 years ago Chronic anxiety   [place], Lilia Argue, [place]       Future Appointments             In 2 weeks Jon Billings, NP [facility] [place]   In 1 month  [place], [place]   In 2 months Furth, [place], PA-C [place], [facility]

## 2022-08-02 NOTE — Telephone Encounter (Signed)
Medication Refill - Medication: ezetimibe (ZETIA) 10 MG tablet  Has the patient contacted their pharmacy? Yes.     Preferred Pharmacy (with phone number or street name):  [facility] [address] [facility] [address] Phone:  [phone]  Fax:  [phone]      Has the patient been seen for an appointment in the last year OR does the patient have an upcoming appointment? Yes.    Sister called back stating there was one more medicine she forgot to add that the patient needs a refill on and is just about out of as well

## 2022-08-03 ENCOUNTER — Other Ambulatory Visit: Payer: Self-pay | Admitting: Nurse Practitioner

## 2022-08-03 DIAGNOSIS — I129 Hypertensive chronic kidney disease with stage 1 through stage 4 chronic kidney disease, or unspecified chronic kidney disease: Secondary | ICD-10-CM

## 2022-08-03 MED ORDER — HYDRALAZINE HCL 10 MG PO TABS: 10.0000 mg | ORAL_TABLET | Freq: Three times a day (TID) | ORAL | 0 refills | Status: DC

## 2022-08-03 MED ORDER — CALCITRIOL 0.25 MCG PO CAPS: 0.2500 ug | ORAL_CAPSULE | Freq: Every day | ORAL | 0 refills | Status: DC

## 2022-08-03 MED ORDER — TOPIRAMATE 50 MG PO TABS: 50.0000 mg | ORAL_TABLET | Freq: Two times a day (BID) | ORAL | 0 refills | Status: DC

## 2022-08-03 MED ORDER — PANTOPRAZOLE SODIUM 40 MG PO TBEC: 40.0000 mg | DELAYED_RELEASE_TABLET | Freq: Two times a day (BID) | ORAL | 0 refills | Status: DC | PRN

## 2022-08-03 MED ORDER — CARVEDILOL 25 MG PO TABS
25.0000 mg | ORAL_TABLET | Freq: Two times a day (BID) | ORAL | 0 refills | Status: DC
Start: 2022-08-03 — End: 2022-08-03

## 2022-08-03 MED ORDER — PREGABALIN 50 MG PO CAPS: 50.0000 mg | ORAL_CAPSULE | Freq: Every day | ORAL | 0 refills | Status: DC

## 2022-08-03 MED ORDER — DULOXETINE HCL 60 MG PO CPEP: 60.0000 mg | ORAL_CAPSULE | Freq: Every day | ORAL | 0 refills | Status: DC

## 2022-08-03 MED ORDER — HYDROXYZINE HCL 50 MG PO TABS: ORAL_TABLET | ORAL | 0 refills | Status: DC

## 2022-08-03 MED ORDER — TORSEMIDE 20 MG PO TABS: 40.0000 mg | ORAL_TABLET | Freq: Every day | ORAL | 0 refills | Status: DC

## 2022-08-03 MED ORDER — AMLODIPINE BESYLATE 10 MG PO TABS: 10.0000 mg | ORAL_TABLET | Freq: Every day | ORAL | 0 refills | Status: DC

## 2022-08-03 NOTE — Telephone Encounter (Signed)
Upcoming appointment scheduled for 08/20/22

## 2022-08-03 NOTE — Telephone Encounter (Signed)
Resending to pharmacy for 90 DS d/t insurance coverage.  Requested Prescriptions  Pending Prescriptions Disp Refills  . carvedilol (COREG) 25 MG tablet [Pharmacy Med Name: CARVEDILOL 25MG  TABLETS] 180 tablet     Sig: TAKE 1 TABLET(25 MG) BY MOUTH TWICE DAILY WITH A MEAL     Cardiovascular: Beta Blockers 3 Failed - 08/03/2022  4:15 PM      Failed - Cr in normal range and within 360 days    Creatinine  Date Value Ref Range Status  08/12/2014 1.55 (H) 0.60 - 1.30 mg/dL Final   Creatinine, Ser  Date Value Ref Range Status  06/28/2022 5.12 (H) 0.61 - 1.24 mg/dL Final   Creatinine, Urine  Date Value Ref Range Status  11/26/2021 133 mg/dL Final         Failed - Last BP in normal range    BP Readings from Last 1 Encounters:  07/19/22 (!) 144/82         Passed - AST in normal range and within 360 days    AST  Date Value Ref Range Status  06/22/2022 18 15 - 41 U/L Final   SGOT(AST)  Date Value Ref Range Status  08/12/2014 24 15 - 37 Unit/L Final         Passed - ALT in normal range and within 360 days    ALT  Date Value Ref Range Status  06/22/2022 14 0 - 44 U/L Final   SGPT (ALT)  Date Value Ref Range Status  08/12/2014 42 U/L Final    Comment:    14-63 NOTE: New Reference Range 06/29/14          Passed - Last Heart Rate in normal range    Pulse Readings from Last 1 Encounters:  07/19/22 75         Passed - Valid encounter within last 6 months    Recent Outpatient Visits          2 weeks ago Acute on chronic diastolic CHF (congestive heart failure) ([place])   [facility] Kathrine Haddock, NP   6 months ago Hypertension associated with diabetes ([place])   [facility] Jon Billings, NP   7 months ago Hospital discharge follow-up   [facility] Jon Billings, NP   1 year ago Hypertension associated with diabetes [facility])   [facility] Jon Billings, NP   2 years ago Chronic anxiety   [place], Lilia Argue, [place]      Future Appointments            In 2 weeks Jon Billings, NP [facility], [place]   In 1 month  [place], [place]   In 2 months Furth, Cadence H, PA-C [place], [facility]           . amLODipine (NORVASC) 10 MG tablet [Pharmacy Med Name: AMLODIPINE BESYLATE 10MG  TABLETS] 90 tablet     Sig: TAKE 1 TABLET(10 MG) BY MOUTH DAILY     Cardiovascular: Calcium Channel Blockers 2 Failed - 08/03/2022  4:15 PM      Failed - Last BP in normal range    BP Readings from Last 1 Encounters:  07/19/22 (!) 144/82         Passed - Last Heart Rate in normal range    Pulse Readings from Last 1 Encounters:  07/19/22 75         Passed - Valid encounter within last 6 months    Recent  Outpatient Visits          2 weeks ago Acute on chronic diastolic CHF (congestive heart failure) ([place])   [facility] Kathrine Haddock, NP   6 months ago Hypertension associated with diabetes [facility])   [facility] Jon Billings, NP   7 months ago Hospital discharge follow-up   [facility] Jon Billings, NP   1 year ago Hypertension associated with diabetes [facility])   [facility] Jon Billings, NP   2 years ago Chronic anxiety   [place], Lilia Argue, [place]      Future Appointments            In 2 weeks Jon Billings, NP [facility], [place]   In 1 month  [place], [place]   In 2 months Furth, Cadence H, PA-C [place], [facility]           . pantoprazole (PROTONIX) 40 MG tablet [Pharmacy Med Name: PANTOPRAZOLE 40MG  TABLETS] 180 tablet     Sig: TAKE 1 TABLET(40 MG) BY MOUTH TWICE DAILY AS NEEDED     Gastroenterology: Proton Pump Inhibitors Passed - 08/03/2022  4:15 PM      Passed - Valid encounter within last 12 months    Recent Outpatient Visits          2 weeks ago Acute on chronic diastolic CHF  (congestive heart failure) ([place])   [facility] Kathrine Haddock, NP   6 months ago Hypertension associated with diabetes [facility])   [facility] Jon Billings, NP   7 months ago Hospital discharge follow-up   [facility] Jon Billings, NP   1 year ago Hypertension associated with diabetes [facility])   [facility] Jon Billings, NP   2 years ago Chronic anxiety   [place], Lilia Argue, [place]      Future Appointments            In 2 weeks Jon Billings, NP [facility], [place]   In 1 month  [place], [place]   In 2 months Furth, Cadence H, PA-C [place], [facility]           . torsemide (DEMADEX) 20 MG tablet [Pharmacy Med Name: TORSEMIDE 20MG  TABLETS] 180 tablet     Sig: TAKE 2 TABLETS(40 MG) BY MOUTH DAILY     Cardiovascular:  Diuretics - Loop Failed - 08/03/2022  4:15 PM      Failed - Ca in normal range and within 180 days    Calcium  Date Value Ref Range Status  06/28/2022 8.3 (L) 8.9 - 10.3 mg/dL Final   Calcium, Total  Date Value Ref Range Status  08/12/2014 9.1 8.5 - 10.1 mg/dL Final         Failed - Cr in normal range and within 180 days    Creatinine  Date Value Ref Range Status  08/12/2014 1.55 (H) 0.60 - 1.30 mg/dL Final   Creatinine, Ser  Date Value Ref Range Status  06/28/2022 5.12 (H) 0.61 - 1.24 mg/dL Final   Creatinine, Urine  Date Value Ref Range Status  11/26/2021 133 mg/dL Final         Failed - Last BP in normal range    BP Readings from Last 1 Encounters:  07/19/22 (!) 144/82         Passed - K in normal range and within 180 days    Potassium  Date Value Ref Range  Status  06/28/2022 4.3 3.5 - 5.1 mmol/L Final    Comment:    HEMOLYSIS AT THIS LEVEL MAY AFFECT RESULT  08/12/2014 3.9 3.5 - 5.1 mmol/L Final   Potassium [facility] vascular lab)  Date Value Ref Range Status  07/16/2022 4 3.5 - 5.1 mmol/L Final     Comment:    Performed at [facility], [address] [place], [place] [address]         Passed - Na in normal range and within 180 days    Sodium  Date Value Ref Range Status  06/28/2022 140 135 - 145 mmol/L Final  02/20/2022 142 134 - 144 mmol/L Final  08/12/2014 139 136 - 145 mmol/L Final         Passed - Cl in normal range and within 180 days    Chloride  Date Value Ref Range Status  06/28/2022 110 98 - 111 mmol/L Final  08/12/2014 103 98 - 107 mmol/L Final         Passed - Mg Level in normal range and within 180 days    Magnesium  Date Value Ref Range Status  06/25/2022 2.4 1.7 - 2.4 mg/dL Final    Comment:    Performed at [facility], [address] [place], [place] [address]         Passed - Valid encounter within last 6 months    Recent Outpatient Visits          2 weeks ago Acute on chronic diastolic CHF (congestive heart failure) ([place])   [facility] Kathrine Haddock, NP   6 months ago Hypertension associated with diabetes ([place])   [facility] Jon Billings, NP   7 months ago Hospital discharge follow-up   [facility] Jon Billings, NP   1 year ago Hypertension associated with diabetes [facility])   [facility] Jon Billings, NP   2 years ago Chronic anxiety   [place], Lilia Argue, [place]      Future Appointments            In 2 weeks Jon Billings, NP [facility], [place]   In 1 month  [place], [place]   In 2 months Furth, Cadence H, PA-C [place], [facility]           . hydrALAZINE (APRESOLINE) 10 MG tablet [Pharmacy Med Name: HYDRALAZINE 10 MG TABLETS (ORANGE)] 270 tablet     Sig: TAKE 1 TABLET(10 MG) BY MOUTH THREE TIMES DAILY     Cardiovascular:  Vasodilators Failed - 08/03/2022  4:15 PM      Failed - HCT in normal range and within 360 days    HCT  Date Value Ref Range Status  06/28/2022 29.9 (L)  39.0 - 52.0 % Final   Hematocrit  Date Value Ref Range Status  02/02/2022 32.7 (L) 37.5 - 51.0 % Final         Failed - HGB in normal range and within 360 days    Hemoglobin  Date Value Ref Range Status  06/28/2022 9.9 (L) 13.0 - 17.0 g/dL Final  02/02/2022 11.2 (L) 13.0 - 17.7 g/dL Final         Failed - RBC in normal range and within 360 days    RBC  Date Value Ref Range Status  06/28/2022 3.39 (L) 4.22 - 5.81 MIL/uL Final         Failed - Last BP in normal range  BP Readings from Last 1 Encounters:  07/19/22 (!) 144/82         Passed - WBC in normal range and within 360 days    WBC  Date Value Ref Range Status  06/28/2022 7.3 4.0 - 10.5 K/uL Final         Passed - PLT in normal range and within 360 days    Platelets  Date Value Ref Range Status  06/28/2022 187 150 - 400 K/uL Final  02/02/2022 290 150 - 450 x10E3/uL Final         Passed - ANA Screen, Ifa, Serum in normal range and within 360 days    Anti Nuclear Antibody (ANA)  Date Value Ref Range Status  11/26/2021 Negative Negative Final    Comment:    (NOTE) Performed At: [facility] [place], [place] [id number] Rush Farmer MD [id number]          MOQHUT - Valid encounter within last 12 months    Recent Outpatient Visits          2 weeks ago Acute on chronic diastolic CHF (congestive heart failure) ([place])   [facility] Kathrine Haddock, NP   6 months ago Hypertension associated with diabetes ([place])   [facility] Jon Billings, NP   7 months ago Hospital discharge follow-up   [facility] Jon Billings, NP   1 year ago Hypertension associated with diabetes [facility])   [facility] Jon Billings, NP   2 years ago Chronic anxiety   [place], Lilia Argue, [place]      Future Appointments            In 2 weeks Jon Billings, NP [facility], [place]   In 1 month  [place]  [place], [facility]   In 2 months Furth, Cadence H, PA-C [place], [facility]           . topiramate (TOPAMAX) 50 MG tablet [Pharmacy Med Name: TOPIRAMATE 50MG  TABLETS] 180 tablet     Sig: TAKE 1 TABLET(50 MG) BY MOUTH TWICE DAILY     Neurology: Anticonvulsants - topiramate & zonisamide Failed - 08/03/2022  4:15 PM      Failed - Cr in normal range and within 360 days    Creatinine  Date Value Ref Range Status  08/12/2014 1.55 (H) 0.60 - 1.30 mg/dL Final   Creatinine, Ser  Date Value Ref Range Status  06/28/2022 5.12 (H) 0.61 - 1.24 mg/dL Final   Creatinine, Urine  Date Value Ref Range Status  11/26/2021 133 mg/dL Final         Passed - CO2 in normal range and within 360 days    CO2  Date Value Ref Range Status  06/28/2022 22 22 - 32 mmol/L Final   Co2  Date Value Ref Range Status  08/12/2014 27 21 - 32 mmol/L Final   Bicarbonate  Date Value Ref Range Status  07/01/2022 27.1 20.0 - 28.0 mmol/L Final         Passed - ALT in normal range and within 360 days    ALT  Date Value Ref Range Status  06/22/2022 14 0 - 44 U/L Final   SGPT (ALT)  Date Value Ref Range Status  08/12/2014 42 U/L Final    Comment:    14-63 NOTE: New Reference Range 06/29/14          Passed - AST in normal range and within 360 days  AST  Date Value Ref Range Status  06/22/2022 18 15 - 41 U/L Final   SGOT(AST)  Date Value Ref Range Status  08/12/2014 24 15 - 60 Unit/L Final         Passed - Completed PHQ-2 or PHQ-9 in the last 360 days      Passed - Valid encounter within last 12 months    Recent Outpatient Visits          2 weeks ago Acute on chronic diastolic CHF (congestive heart failure) ([place])   [facility] Kathrine Haddock, NP   6 months ago Hypertension associated with diabetes [facility])   [facility] Jon Billings, NP   7 months ago Hospital discharge follow-up   [facility] Jon Billings, NP   1 year ago  Hypertension associated with diabetes [facility])   [facility] Jon Billings, NP   2 years ago Chronic anxiety   [place], Lilia Argue, [place]      Future Appointments            In 2 weeks Jon Billings, NP [facility], [place]   In 1 month  [place], [place]   In 2 months Furth, [place], PA-C [place], [facility]

## 2022-08-08 ENCOUNTER — Ambulatory Visit: Payer: Medicare Other | Admitting: Family

## 2022-08-14 ENCOUNTER — Other Ambulatory Visit (INDEPENDENT_AMBULATORY_CARE_PROVIDER_SITE_OTHER): Payer: Self-pay | Admitting: Vascular Surgery

## 2022-08-14 DIAGNOSIS — N186 End stage renal disease: Secondary | ICD-10-CM

## 2022-08-14 DIAGNOSIS — Z9862 Peripheral vascular angioplasty status: Secondary | ICD-10-CM

## 2022-08-15 ENCOUNTER — Encounter (INDEPENDENT_AMBULATORY_CARE_PROVIDER_SITE_OTHER): Payer: Self-pay | Admitting: Nurse Practitioner

## 2022-08-15 ENCOUNTER — Ambulatory Visit (INDEPENDENT_AMBULATORY_CARE_PROVIDER_SITE_OTHER): Payer: Medicare Other

## 2022-08-15 ENCOUNTER — Ambulatory Visit (INDEPENDENT_AMBULATORY_CARE_PROVIDER_SITE_OTHER): Payer: Medicare Other | Admitting: Nurse Practitioner

## 2022-08-15 VITALS — BP 165/75 | HR 79 | Resp 16 | Wt 230.8 lb

## 2022-08-15 DIAGNOSIS — N185 Chronic kidney disease, stage 5: Secondary | ICD-10-CM

## 2022-08-15 DIAGNOSIS — N186 End stage renal disease: Secondary | ICD-10-CM | POA: Diagnosis not present

## 2022-08-15 DIAGNOSIS — E785 Hyperlipidemia, unspecified: Secondary | ICD-10-CM

## 2022-08-15 DIAGNOSIS — E1122 Type 2 diabetes mellitus with diabetic chronic kidney disease: Secondary | ICD-10-CM

## 2022-08-15 NOTE — Progress Notes (Signed)
Subjective:    Patient ID: Derek Binet., male    DOB: 07/03/1966, 56 y.o.   MRN: [id number] Chief Complaint  Patient presents with   Follow-up    [facility] 4 week with HDA    The patient presents today as a follow-up for recent fistulogram.  He underwent angioplasty of left cephalic vein at the antecubital fossa.  Today it is noted that the cephalic fluid however the fistula still continues to have further the basilic.  The patient has a flow volume of 522 with a palpable thrill and bruit.  Previous wound is now healed.    Review of Systems  All other systems reviewed and are negative.      Objective:   Physical Exam Vitals reviewed.  HENT:     Head: Normocephalic.  Cardiovascular:     Rate and Rhythm: Normal rate.     Pulses:          Radial pulses are 2+ on the right side and 2+ on the left side.  Pulmonary:     Effort: Pulmonary effort is normal.  Skin:    General: Skin is warm and dry.  Neurological:     Mental Status: He is alert and oriented to person, place, and time.  Psychiatric:        Mood and Affect: Mood normal.        Behavior: Behavior normal.        Thought Content: Thought content normal.        Judgment: Judgment normal.     BP (!) 165/75 (BP Location: Right Arm)   Pulse 79   Resp 16   Wt 230 lb 12.8 oz (104.7 kg)   BMI 35.61 kg/m   Past Medical History:  Diagnosis Date   Abdominal abscess    a.) chronic MRSA infection   Acute pancreatitis    Anxiety    Asthma    Brain aneurysm    a.) 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 b.) RIGHT crainotomy for clip ligation 05/14/2019.   Chronic back pain    Chronic heart failure with preserved ejection fraction (HFpEF) ([place])    a. 11/2021 Echo: EF 55-60%, no rwma, mod LVH, GrI DD. Nl RV size/fxn. Mildly dil LA.   CKD (chronic kidney disease), stage V (HCC)    Depression    Emphysema of lung (HCC)    Erectile dysfunction     Followed by palliative care service    GERD (gastroesophageal reflux disease)    History of methicillin resistant staphylococcus aureus (MRSA)    HLD (hyperlipidemia)    Hypertension    Mild cognitive impairment    MRSA (methicillin resistant Staphylococcus aureus)    Obesity    OSA (obstructive sleep apnea) 2013   a.) not compliant with nocturnal PAP therapy; CPAP machine "lost or stolen".   Panic disorder    Perforated bowel ([place]) 12/10/2005   Tempoary Colostomy Bag, Skin Graft for Abd wound   Polysubstance abuse ([place])    a.) ETOH, tobacco, marijuana, methamphetamines, cocaine, BZO, opioids.   Subarachnoid hemorrhage ([place]) 01/26/2010   a.) 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   T2DM (type 2 diabetes mellitus) ([place])    Tobacco abuse    Type 2 diabetes mellitus without complication, without long-term current use of insulin ([place]) 04/17/2016   Vitreous hemorrhage ([place]) 03/27/2011   Overview:  Bilateral;  01/2010, from [facility]     Social History   Socioeconomic History   Marital status: Divorced    Spouse name: Not on file   Number of children: Not on file   Years of education: Not on file   Highest education level: Not on file  Occupational History   Not on file  Tobacco Use   Smoking status: Every Day    Packs/day: 0.50    Types: Cigarettes    Start date: 10/11/1984   Smokeless tobacco: Never  Vaping Use   Vaping Use: Never used  Substance and Sexual Activity   Alcohol use: Yes   Drug use: Not Currently    Types: "Crack" cocaine, Amphetamines, Methamphetamines, Benzodiazepines, Cocaine, Marijuana, Other-see comments    Comment: opioids   Sexual activity: Not Currently  Other Topics Concern   Not on file  Social History Narrative   Lives alone and has nursing assistance help that lives in.   Social Determinants of Health   Financial Resource Strain: Medium Risk (06/11/2022)   Overall Financial  Resource Strain (CARDIA)    Difficulty of Paying Living Expenses: Somewhat hard  Food Insecurity: No Food Insecurity (06/11/2022)   Hunger Vital Sign    Worried About Running Out of Food in the Last Year: Never true    Ran Out of Food in the Last Year: Never true  Transportation Needs: Unmet Transportation Needs (06/11/2022)   PRAPARE - Hydrologist (Medical): Yes    Lack of Transportation (Non-Medical): No  Physical Activity: Inactive (06/11/2022)   Exercise Vital Sign    Days of Exercise per Week: 0 days    Minutes of Exercise per Session: 0 min  Stress: No Stress Concern Present (06/11/2022)   [place]    Feeling of Stress : Not at all  Social Connections: Socially Isolated (06/11/2022)   Social Connection and Isolation Panel [NHANES]    Frequency of Communication with Friends and Family: More than three times a week    Frequency of Social Gatherings with Friends and Family: More than three times a week    Attends Religious Services: Never    Marine scientist or Organizations: No    Attends Archivist Meetings: Never    Marital Status: Divorced  Human resources officer Violence: Not At Risk (06/11/2022)   Humiliation, Afraid, Rape, and Kick questionnaire    Fear of Current or Ex-Partner: No    Emotionally Abused: No    Physically Abused: No    Sexually Abused: No    Past Surgical History:  Procedure Laterality Date   A/V FISTULAGRAM Left 07/16/2022   Procedure: A/V Fistulagram;  Surgeon: Algernon Huxley, MD;  Location: [facility] [facility];  Service: Cardiovascular;  Laterality: Left;   AV FISTULA PLACEMENT Left 05/24/2022   Procedure: ARTERIOVENOUS (AV) FISTULA CREATION ( RADIAL CEPHALIC);  Surgeon: Algernon Huxley, MD;  Location: [facility];  Service: Vascular;  Laterality: Left;   CEREBRAL ANEURYSM REPAIR Right 01/26/2010   Procedure: CEREBRAL ANEURYSM REPAIR (COIL EMBOLIZATION)   CEREBRAL  ANEURYSM REPAIR Right 05/14/2019   Procedure: CRAINOTOMY FOR CEREBRAL ANEURYSM REPAIR (CLIP LIGATION)   COLON SURGERY  12/10/2005   colostomy bag placed s/p perforated bowel   COLONOSCOPY WITH PROPOFOL N/A 05/18/2020   Procedure: COLONOSCOPY WITH PROPOFOL;  Surgeon: Lin Landsman, MD;  Location: [place];  Service: Gastroenterology;  Laterality: N/A;   DIALYSIS/PERMA CATHETER INSERTION N/A 06/27/2022   Procedure:  DIALYSIS/PERMA CATHETER INSERTION;  Surgeon: Katha Cabal, MD;  Location: [place] [facility];  Service: Cardiovascular;  Laterality: N/A;   KNEE SURGERY Right    Perforated bowel      Family History  Problem Relation Age of Onset   Arthritis Mother    Asthma Mother    Diabetes Mother    Heart disease Mother    Hyperlipidemia Mother    Hypertension Mother    Kidney disease Mother    Thyroid disease Mother    Lung disease Mother    Anxiety disorder Mother    Depression Mother    Diabetes Father    Heart disease Father    Depression Father    Anxiety disorder Father    Arthritis Sister    Asthma Sister    Hyperlipidemia Sister    Hypertension Sister    Lung disease Sister    Anxiety disorder Sister    Depression Sister    Hyperlipidemia Brother    Hypertension Brother    Diabetes Sister    Heart disease Sister    Depression Sister    Anxiety disorder Sister    Anxiety disorder Brother    Depression Brother    Heart disease Brother     Allergies  Allergen Reactions   Atorvastatin     Joint Aches - Severe Joint Aches - Severe Joint Aches - Severe   Avelox [Moxifloxacin Hcl In Nacl]     Muscle pain   Buprenorphine     Mouth sores, confusion, shaking   Dilaudid [Hydromorphone Hcl] Hives   Fluoxetine Itching   Levofloxacin Other (See Comments)    Joint Pain   Morphine And Related    Other     Muscle pain   Suboxone [Buprenorphine Hcl-Naloxone Hcl] Other (See Comments)    Rash and confused   Vancomycin     Renal insufficiency        Latest Ref Rng & Units 06/28/2022    7:10 AM 06/24/2022    6:09 AM 06/23/2022    8:07 AM  CBC  WBC 4.0 - 10.5 K/uL 7.3  9.7  8.5   Hemoglobin 13.0 - 17.0 g/dL 9.9  10.3  10.2   Hematocrit 39.0 - 52.0 % 29.9  31.3  31.3   Platelets 150 - 400 K/uL 187  222  236       CMP     Component Value Date/Time   NA 140 06/28/2022 0538   NA 142 02/20/2022 1501   NA 139 08/12/2014 1019   K 4.3 06/28/2022 0538   K 3.9 08/12/2014 1019   CL 110 06/28/2022 0538   CL 103 08/12/2014 1019   CO2 22 06/28/2022 0538   CO2 27 08/12/2014 1019   GLUCOSE 98 06/28/2022 0538   GLUCOSE 100 (H) 08/12/2014 1019   BUN 76 (H) 06/28/2022 0538   BUN 52 (H) 02/20/2022 1501   BUN 17 08/12/2014 1019   CREATININE 5.12 (H) 06/28/2022 0538   CREATININE 1.55 (H) 08/12/2014 1019   CALCIUM 8.3 (L) 06/28/2022 0538   CALCIUM 9.1 08/12/2014 1019   PROT 6.9 06/22/2022 2209   PROT 6.3 02/20/2022 1501   PROT 6.6 08/12/2014 1019   ALBUMIN 3.9 06/22/2022 2209   ALBUMIN 4.2 02/20/2022 1501   ALBUMIN 3.4 08/12/2014 1019   AST 18 06/22/2022 2209   AST 24 08/12/2014 1019   ALT 14 06/22/2022 2209   ALT 42 08/12/2014 1019   ALKPHOS 75 06/22/2022 2209   ALKPHOS 94 08/12/2014  1019   BILITOT 0.6 06/22/2022 2209   BILITOT <0.2 02/20/2022 1501   BILITOT 0.8 08/12/2014 1019   GFRNONAA 12 (L) 06/28/2022 0538   GFRNONAA 52 (L) 08/12/2014 1019   GFRAA 65 06/29/2020 1033   GFRAA >60 08/12/2014 1019     No results found.     Assessment & Plan:   1. ESRD (end stage renal disease) ([place]) Today noninvasive studies show that the cephalic vein is occluded but the patient currently has outflow via the basilic vein.  Based on this the fistula should be functional.  We will allow the patient to utilize this.  We will send a letter to his dialysis center to indicate that it is adequate for usage.  We will tentatively plan on having him return in 3 months however if they have issues with utilizing it we can have him return for  evaluation sooner. - VAS US DUPLEX DIALYSIS ACCESS (AVF, AVG)  2. Type 2 diabetes mellitus with stage 5 chronic kidney disease not on chronic dialysis, without long-term current use of insulin (HCC) Continue hypoglycemic medications as already ordered, these medications have been reviewed and there are no changes at this time.  Hgb A1C to be monitored as already arranged by primary service   3. Hyperlipidemia, unspecified hyperlipidemia type Continue statin as ordered and reviewed, no changes at this time    Current Outpatient Medications on File Prior to Visit  Medication Sig Dispense Refill   albuterol (VENTOLIN HFA) 108 (90 Base) MCG/ACT inhaler Inhale 1-2 puffs into the lungs every 6 (six) hours as needed for wheezing or shortness of breath. 18 g 0   amLODipine (NORVASC) 10 MG tablet TAKE 1 TABLET(10 MG) BY MOUTH DAILY 90 tablet 0   aspirin EC 81 MG tablet Take 1 tablet (81 mg total) by mouth daily. Swallow whole. 30 tablet 0   budesonide-formoterol (SYMBICORT) 160-4.5 MCG/ACT inhaler Inhale 2 puffs into the lungs 2 (two) times daily. 1 each 0   calcitRIOL (ROCALTROL) 0.25 MCG capsule Take 1 capsule (0.25 mcg total) by mouth daily. 30 capsule 0   carvedilol (COREG) 25 MG tablet TAKE 1 TABLET(25 MG) BY MOUTH TWICE DAILY WITH A MEAL 180 tablet 0   DULoxetine (CYMBALTA) 60 MG capsule Take 1 capsule (60 mg total) by mouth daily. 30 capsule 0   Evolocumab (REPATHA SURECLICK) 952 MG/ML SOAJ Inject 140 mg into the skin every 14 (fourteen) days. 2 mL 11   ezetimibe (ZETIA) 10 MG tablet Take 1 tablet (10 mg total) by mouth daily. 30 tablet 0   glucose blood (ONETOUCH ULTRA) test strip USE TO TEST BLOOD SUGAR DAILY 100 each 3   hydrALAZINE (APRESOLINE) 10 MG tablet TAKE 1 TABLET(10 MG) BY MOUTH THREE TIMES DAILY 270 tablet 0   hydrOXYzine (ATARAX) 50 MG tablet TAKE ONE TABLET 3 TIMES DAILY AS NEEDED 20 tablet 0   multivitamin (RENA-VIT) TABS tablet Take 1 tablet by mouth daily.     OXYGEN Inhale  3 L into the lungs daily.     pantoprazole (PROTONIX) 40 MG tablet TAKE 1 TABLET(40 MG) BY MOUTH TWICE DAILY AS NEEDED 180 tablet 0   patiromer (VELTASSA) 8.4 g packet Take 1 packet (8.4 g total) by mouth every Monday, Wednesday, and Friday. 30 each 0   pregabalin (LYRICA) 50 MG capsule Take 1 capsule (50 mg total) by mouth daily. 30 capsule 0   silver sulfADIAZINE (SILVADENE) 1 % cream Apply 1 Application topically daily. 50 g 0   topiramate (TOPAMAX)  50 MG tablet TAKE 1 TABLET(50 MG) BY MOUTH TWICE DAILY 180 tablet 0   torsemide (DEMADEX) 20 MG tablet TAKE 2 TABLETS(40 MG) BY MOUTH DAILY 180 tablet 0   traZODone (DESYREL) 100 MG tablet Take 1 tablet (100 mg total) by mouth at bedtime. 30 tablet 0   No current facility-administered medications on file prior to visit.    There are no Patient Instructions on file for this visit. No follow-ups on file.   Kris Hartmann, NP

## 2022-08-15 NOTE — Progress Notes (Incomplete)
Subjective:    Patient ID: Derek Binet., male    DOB: 24-Dec-1965, 56 y.o.   MRN: [id number] Chief Complaint  Patient presents with  . Follow-up    [facility] 4 week with HDA    HPI  Review of Systems     Objective:   Physical Exam  BP (!) 165/75 (BP Location: Right Arm)   Pulse 79   Resp 16   Wt 230 lb 12.8 oz (104.7 kg)   BMI 35.61 kg/m   Past Medical History:  Diagnosis Date  . Abdominal abscess    a.) chronic MRSA infection  . Acute pancreatitis   . Anxiety   . Asthma   . Brain aneurysm    a.) 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 b.) RIGHT crainotomy for clip ligation 05/14/2019.  [place] Kitchen Chronic back pain   . Chronic heart failure with preserved ejection fraction (HFpEF) ([place])    a. 11/2021 Echo: EF 55-60%, no rwma, mod LVH, GrI DD. Nl RV size/fxn. Mildly dil LA.  . CKD (chronic kidney disease), stage V ([place])   . Depression   . Emphysema of lung ([place])   . Erectile dysfunction   . Followed by palliative care service   . GERD (gastroesophageal reflux disease)   . History of methicillin resistant staphylococcus aureus (MRSA)   . HLD (hyperlipidemia)   . Hypertension   . Mild cognitive impairment   . MRSA (methicillin resistant Staphylococcus aureus)   . Obesity   . OSA (obstructive sleep apnea) 2013   a.) not compliant with nocturnal PAP therapy; CPAP machine "lost or stolen".  . Panic disorder   . Perforated bowel ([place]) 12/10/2005   Tempoary Colostomy Bag, Skin Graft for Abd wound  . Polysubstance abuse ([place])    a.) ETOH, tobacco, marijuana, methamphetamines, cocaine, BZO, opioids.  . Subarachnoid hemorrhage ([place]) 01/26/2010   a.) 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  . T2DM (type 2 diabetes mellitus) ([place])   . Tobacco abuse   . Type 2 diabetes mellitus without complication, without long-term current use of  insulin ([place]) 04/17/2016  . Vitreous hemorrhage ([place]) 03/27/2011   Overview:  Bilateral; 01/2010, from [facility]     Social History   Socioeconomic History  . Marital status: Divorced    Spouse name: Not on file  . Number of children: Not on file  . Years of education: Not on file  . Highest education level: Not on file  Occupational History  . Not on file  Tobacco Use  . Smoking status: Every Day    Packs/day: 0.50    Types: Cigarettes    Start date: 10/11/1984  . Smokeless tobacco: Never  Vaping Use  . Vaping Use: Never used  Substance and Sexual Activity  . Alcohol use: Yes  . Drug use: Not Currently    Types: "Crack" cocaine, Amphetamines, Methamphetamines, Benzodiazepines, Cocaine, Marijuana, Other-see comments    Comment: opioids  . Sexual activity: Not Currently  Other Topics Concern  . Not on file  Social History Narrative   Lives alone and has nursing assistance help that lives in.   Social Determinants of Health   Financial Resource Strain: Medium Risk (06/11/2022)   Overall Financial Resource Strain (CARDIA)   . Difficulty of Paying Living Expenses: Somewhat hard  Food Insecurity: No Food Insecurity (06/11/2022)   Hunger Vital Sign   . Worried About  Running Out of Food in the Last Year: Never true   . Ran Out of Food in the Last Year: Never true  Transportation Needs: Unmet Transportation Needs (06/11/2022)   PRAPARE - Transportation   . Lack of Transportation (Medical): Yes   . Lack of Transportation (Non-Medical): No  Physical Activity: Inactive (06/11/2022)   Exercise Vital Sign   . Days of Exercise per Week: 0 days   . Minutes of Exercise per Session: 0 min  Stress: No Stress Concern Present (06/11/2022)   [place]   . Feeling of Stress : Not at all  Social Connections: Socially Isolated (06/11/2022)   Social Connection and Isolation Panel [NHANES]   . Frequency of Communication with Friends and  Family: More than three times a week   . Frequency of Social Gatherings with Friends and Family: More than three times a week   . Attends Religious Services: Never   . Active Member of Clubs or Organizations: No   . Attends Archivist Meetings: Never   . Marital Status: Divorced  Human resources officer Violence: Not At Risk (06/11/2022)   Humiliation, Afraid, Rape, and Kick questionnaire   . Fear of Current or Ex-Partner: No   . Emotionally Abused: No   . Physically Abused: No   . Sexually Abused: No    Past Surgical History:  Procedure Laterality Date  . A/V FISTULAGRAM Left 07/16/2022   Procedure: A/V Fistulagram;  Surgeon: Algernon Huxley, MD;  Location: [place] [facility];  Service: Cardiovascular;  Laterality: Left;  . AV FISTULA PLACEMENT Left 05/24/2022   Procedure: ARTERIOVENOUS (AV) FISTULA CREATION ( RADIAL CEPHALIC);  Surgeon: Algernon Huxley, MD;  Location: [facility];  Service: Vascular;  Laterality: Left;  . CEREBRAL ANEURYSM REPAIR Right 01/26/2010   Procedure: CEREBRAL ANEURYSM REPAIR (COIL EMBOLIZATION)  . CEREBRAL ANEURYSM REPAIR Right 05/14/2019   Procedure: CRAINOTOMY FOR CEREBRAL ANEURYSM REPAIR (CLIP LIGATION)  . COLON SURGERY  12/10/2005   colostomy bag placed s/p perforated bowel  . COLONOSCOPY WITH PROPOFOL N/A 05/18/2020   Procedure: COLONOSCOPY WITH PROPOFOL;  Surgeon: Lin Landsman, MD;  Location[facility];  Service: Gastroenterology;  Laterality: N/A;  . DIALYSIS/PERMA CATHETER INSERTION N/A 06/27/2022   Procedure: DIALYSIS/PERMA CATHETER INSERTION;  Surgeon: Katha Cabal, MD;  Location: [place] [facility];  Service: Cardiovascular;  Laterality: N/A;  . KNEE SURGERY Right   . Perforated bowel      Family History  Problem Relation Age of Onset  . Arthritis Mother   . Asthma Mother   . Diabetes Mother   . Heart disease Mother   . Hyperlipidemia Mother   . Hypertension Mother   . Kidney disease Mother   . Thyroid disease Mother   .  Lung disease Mother   . Anxiety disorder Mother   . Depression Mother   . Diabetes Father   . Heart disease Father   . Depression Father   . Anxiety disorder Father   . Arthritis Sister   . Asthma Sister   . Hyperlipidemia Sister   . Hypertension Sister   . Lung disease Sister   . Anxiety disorder Sister   . Depression Sister   . Hyperlipidemia Brother   . Hypertension Brother   . Diabetes Sister   . Heart disease Sister   . Depression Sister   . Anxiety disorder Sister   . Anxiety disorder Brother   . Depression Brother   . Heart disease  Brother     Allergies  Allergen Reactions  . Atorvastatin     Joint Aches - Severe Joint Aches - Severe Joint Aches - Severe  . Avelox [Moxifloxacin Hcl In Nacl]     Muscle pain  . Buprenorphine     Mouth sores, confusion, shaking  . Dilaudid [Hydromorphone Hcl] Hives  . Fluoxetine Itching  . Levofloxacin Other (See Comments)    Joint Pain  . Morphine And Related   . Other     Muscle pain  . Suboxone [Buprenorphine Hcl-Naloxone Hcl] Other (See Comments)    Rash and confused  . Vancomycin     Renal insufficiency       Latest Ref Rng & Units 06/28/2022    7:10 AM 06/24/2022    6:09 AM 06/23/2022    8:07 AM  CBC  WBC 4.0 - 10.5 K/uL 7.3  9.7  8.5   Hemoglobin 13.0 - 17.0 g/dL 9.9  10.3  10.2   Hematocrit 39.0 - 52.0 % 29.9  31.3  31.3   Platelets 150 - 400 K/uL 187  222  236       CMP     Component Value Date/Time   NA 140 06/28/2022 0538   NA 142 02/20/2022 1501   NA 139 08/12/2014 1019   K 4.3 06/28/2022 0538   K 3.9 08/12/2014 1019   CL 110 06/28/2022 0538   CL 103 08/12/2014 1019   CO2 22 06/28/2022 0538   CO2 27 08/12/2014 1019   GLUCOSE 98 06/28/2022 0538   GLUCOSE 100 (H) 08/12/2014 1019   BUN 76 (H) 06/28/2022 0538   BUN 52 (H) 02/20/2022 1501   BUN 17 08/12/2014 1019   CREATININE 5.12 (H) 06/28/2022 0538   CREATININE 1.55 (H) 08/12/2014 1019   CALCIUM 8.3 (L) 06/28/2022 0538   CALCIUM 9.1  08/12/2014 1019   PROT 6.9 06/22/2022 2209   PROT 6.3 02/20/2022 1501   PROT 6.6 08/12/2014 1019   ALBUMIN 3.9 06/22/2022 2209   ALBUMIN 4.2 02/20/2022 1501   ALBUMIN 3.4 08/12/2014 1019   AST 18 06/22/2022 2209   AST 24 08/12/2014 1019   ALT 14 06/22/2022 2209   ALT 42 08/12/2014 1019   ALKPHOS 75 06/22/2022 2209   ALKPHOS 94 08/12/2014 1019   BILITOT 0.6 06/22/2022 2209   BILITOT <0.2 02/20/2022 1501   BILITOT 0.8 08/12/2014 1019   GFRNONAA 12 (L) 06/28/2022 0538   GFRNONAA 52 (L) 08/12/2014 1019   GFRAA 65 06/29/2020 1033   GFRAA >60 08/12/2014 1019     No results found.     Assessment & Plan:   1. ESRD (end stage renal disease) ([place]) *** - VAS US DUPLEX DIALYSIS ACCESS (AVF, AVG)  2. Type 2 diabetes mellitus with stage 5 chronic kidney disease not on chronic dialysis, without long-term current use of insulin (HCC) ***  3. Hyperlipidemia, unspecified hyperlipidemia type ***   Current Outpatient Medications on File Prior to Visit  Medication Sig Dispense Refill  . albuterol (VENTOLIN HFA) 108 (90 Base) MCG/ACT inhaler Inhale 1-2 puffs into the lungs every 6 (six) hours as needed for wheezing or shortness of breath. 18 g 0  . amLODipine (NORVASC) 10 MG tablet TAKE 1 TABLET(10 MG) BY MOUTH DAILY 90 tablet 0  . aspirin EC 81 MG tablet Take 1 tablet (81 mg total) by mouth daily. Swallow whole. 30 tablet 0  . budesonide-formoterol (SYMBICORT) 160-4.5 MCG/ACT inhaler Inhale 2 puffs into the lungs 2 (two) times daily. 1  each 0  . calcitRIOL (ROCALTROL) 0.25 MCG capsule Take 1 capsule (0.25 mcg total) by mouth daily. 30 capsule 0  . carvedilol (COREG) 25 MG tablet TAKE 1 TABLET(25 MG) BY MOUTH TWICE DAILY WITH A MEAL 180 tablet 0  . DULoxetine (CYMBALTA) 60 MG capsule Take 1 capsule (60 mg total) by mouth daily. 30 capsule 0  . Evolocumab (REPATHA SURECLICK) 929 MG/ML SOAJ Inject 140 mg into the skin every 14 (fourteen) days. 2 mL 11  . ezetimibe (ZETIA) 10 MG tablet Take 1  tablet (10 mg total) by mouth daily. 30 tablet 0  . glucose blood (ONETOUCH ULTRA) test strip USE TO TEST BLOOD SUGAR DAILY 100 each 3  . hydrALAZINE (APRESOLINE) 10 MG tablet TAKE 1 TABLET(10 MG) BY MOUTH THREE TIMES DAILY 270 tablet 0  . hydrOXYzine (ATARAX) 50 MG tablet TAKE ONE TABLET 3 TIMES DAILY AS NEEDED 20 tablet 0  . multivitamin (RENA-VIT) TABS tablet Take 1 tablet by mouth daily.    . OXYGEN Inhale 3 L into the lungs daily.    . pantoprazole (PROTONIX) 40 MG tablet TAKE 1 TABLET(40 MG) BY MOUTH TWICE DAILY AS NEEDED 180 tablet 0  . patiromer (VELTASSA) 8.4 g packet Take 1 packet (8.4 g total) by mouth every Monday, Wednesday, and Friday. 30 each 0  . pregabalin (LYRICA) 50 MG capsule Take 1 capsule (50 mg total) by mouth daily. 30 capsule 0  . silver sulfADIAZINE (SILVADENE) 1 % cream Apply 1 Application topically daily. 50 g 0  . topiramate (TOPAMAX) 50 MG tablet TAKE 1 TABLET(50 MG) BY MOUTH TWICE DAILY 180 tablet 0  . torsemide (DEMADEX) 20 MG tablet TAKE 2 TABLETS(40 MG) BY MOUTH DAILY 180 tablet 0  . traZODone (DESYREL) 100 MG tablet Take 1 tablet (100 mg total) by mouth at bedtime. 30 tablet 0   No current facility-administered medications on file prior to visit.    There are no Patient Instructions on file for this visit. No follow-ups on file.   Kris Hartmann, NP

## 2022-08-20 ENCOUNTER — Ambulatory Visit: Payer: Medicare Other | Admitting: Nurse Practitioner

## 2022-08-23 ENCOUNTER — Ambulatory Visit: Payer: Medicare Other | Admitting: Physician Assistant

## 2022-08-27 ENCOUNTER — Other Ambulatory Visit: Payer: Medicare Other | Admitting: Primary Care

## 2022-08-27 NOTE — Progress Notes (Unsigned)
   Patient ID: Derek Cooley., male    DOB: September 03, 1966, 56 y.o.   MRN: [id number]  HPI  Mr Gelin is a 56 y/o male with a history of  Echo report from 06/23/22 reviewed and showed an EF of 60-65% along with mild LVH.   Admitted 06/23/22 due to SOB and chest pain. BP elevated and hypoxic. Initially given IV lasix with transition to oral diuretics. Cardiology, nephrology and vascular consults obtained. Permacath placed and dialysis started. Unable to be weaned off oxygen. Discharged after 9 days.   He presents today for his initial visit with a chief complaint of   Review of Systems    Physical Exam    Assessment & Plan:  1: Chronic heart failure with preserved ejection fraction with structural changes (LVH)- - NYHA class - saw cardiology Kathlen Mody) 07/19/22 - on GDMT of  - BNP 11/25/21 was 549.9  2: HTN- - BP - saw PCP Julian Hy) 07/17/22 - BMP 06/28/22 reviewed and showed sodium 140, potassium 4.3, creatinine 5.12 and GFR 12  3: CKD / ESRD- - dialysis T, TH, Sat - saw vascular Owens Shark) 08/15/22  4: DM- - A1c 06/23/22 was 5.3%  5: COPD-

## 2022-08-28 ENCOUNTER — Encounter: Payer: Self-pay | Admitting: Family

## 2022-08-28 ENCOUNTER — Ambulatory Visit: Payer: Medicare Other | Attending: Family | Admitting: Family

## 2022-08-28 VITALS — BP 138/80 | HR 77 | Resp 18 | Ht 67.0 in | Wt 233.0 lb

## 2022-08-28 DIAGNOSIS — J449 Chronic obstructive pulmonary disease, unspecified: Secondary | ICD-10-CM

## 2022-08-28 DIAGNOSIS — Z9981 Dependence on supplemental oxygen: Secondary | ICD-10-CM | POA: Insufficient documentation

## 2022-08-28 DIAGNOSIS — I132 Hypertensive heart and chronic kidney disease with heart failure and with stage 5 chronic kidney disease, or end stage renal disease: Secondary | ICD-10-CM | POA: Diagnosis not present

## 2022-08-28 DIAGNOSIS — Z992 Dependence on renal dialysis: Secondary | ICD-10-CM | POA: Diagnosis not present

## 2022-08-28 DIAGNOSIS — E1122 Type 2 diabetes mellitus with diabetic chronic kidney disease: Secondary | ICD-10-CM | POA: Diagnosis not present

## 2022-08-28 DIAGNOSIS — R252 Cramp and spasm: Secondary | ICD-10-CM | POA: Diagnosis not present

## 2022-08-28 DIAGNOSIS — F17200 Nicotine dependence, unspecified, uncomplicated: Secondary | ICD-10-CM | POA: Diagnosis not present

## 2022-08-28 DIAGNOSIS — Z8679 Personal history of other diseases of the circulatory system: Secondary | ICD-10-CM | POA: Diagnosis not present

## 2022-08-28 DIAGNOSIS — N186 End stage renal disease: Secondary | ICD-10-CM

## 2022-08-28 DIAGNOSIS — E785 Hyperlipidemia, unspecified: Secondary | ICD-10-CM | POA: Diagnosis not present

## 2022-08-28 DIAGNOSIS — I5032 Chronic diastolic (congestive) heart failure: Secondary | ICD-10-CM | POA: Diagnosis not present

## 2022-08-28 DIAGNOSIS — K219 Gastro-esophageal reflux disease without esophagitis: Secondary | ICD-10-CM | POA: Diagnosis not present

## 2022-08-28 DIAGNOSIS — M7989 Other specified soft tissue disorders: Secondary | ICD-10-CM | POA: Diagnosis not present

## 2022-08-28 DIAGNOSIS — Z79899 Other long term (current) drug therapy: Secondary | ICD-10-CM | POA: Insufficient documentation

## 2022-08-28 DIAGNOSIS — I10 Essential (primary) hypertension: Secondary | ICD-10-CM | POA: Diagnosis not present

## 2022-08-28 DIAGNOSIS — G4730 Sleep apnea, unspecified: Secondary | ICD-10-CM | POA: Diagnosis not present

## 2022-08-28 NOTE — Patient Instructions (Signed)
Continue weighing daily and call for an overnight weight gain of 3 pounds or more or a weekly weight gain of more than 5 pounds.   If you have voicemail, please make sure your mailbox is cleaned out so that we may leave a message and please make sure to listen to any voicemails.     

## 2022-09-03 ENCOUNTER — Ambulatory Visit: Payer: Medicare Other

## 2022-09-03 NOTE — Progress Notes (Deleted)
09/03/2022 Derek Sports Jr. Jan 28, 1966 [id number]   HPI:  Derek Cooley. is a 56 y.o. male patient of Dr Fletcher Anon, who presents today for a lipid clinic evaluation.  See pertinent past medical history below.  Past Medical History:                  Current Medications:  Cholesterol Goals:   Intolerant/previously tried: atorvastatin - myalgias  Family history:   Diet:   Exercise:    Labs: 7/23.  TC 201, TG 83, HDL 47, LDL 137   Current Outpatient Medications  Medication Sig Dispense Refill   albuterol (VENTOLIN HFA) 108 (90 Base) MCG/ACT inhaler Inhale 1-2 puffs into the lungs every 6 (six) hours as needed for wheezing or shortness of breath. 18 g 0   amLODipine (NORVASC) 10 MG tablet TAKE 1 TABLET(10 MG) BY MOUTH DAILY 90 tablet 0   aspirin EC 81 MG tablet Take 1 tablet (81 mg total) by mouth daily. Swallow whole. 30 tablet 0   budesonide-formoterol (SYMBICORT) 160-4.5 MCG/ACT inhaler Inhale 2 puffs into the lungs 2 (two) times daily. 1 each 0   calcitRIOL (ROCALTROL) 0.25 MCG capsule Take 1 capsule (0.25 mcg total) by mouth daily. 30 capsule 0   carvedilol (COREG) 25 MG tablet TAKE 1 TABLET(25 MG) BY MOUTH TWICE DAILY WITH A MEAL 180 tablet 0   DULoxetine (CYMBALTA) 60 MG capsule Take 1 capsule (60 mg total) by mouth daily. 30 capsule 0   Evolocumab (REPATHA SURECLICK) 027 MG/ML SOAJ Inject 140 mg into the skin every 14 (fourteen) days. 2 mL 11   ezetimibe (ZETIA) 10 MG tablet Take 1 tablet (10 mg total) by mouth daily. 30 tablet 0   glucose blood (ONETOUCH ULTRA) test strip USE TO TEST BLOOD SUGAR DAILY 100 each 3   hydrALAZINE (APRESOLINE) 10 MG tablet TAKE 1 TABLET(10 MG) BY MOUTH THREE TIMES DAILY 270 tablet 0   hydrOXYzine (ATARAX) 50 MG tablet TAKE ONE TABLET 3 TIMES DAILY AS NEEDED 20 tablet 0   multivitamin (RENA-VIT) TABS tablet Take 1 tablet by mouth daily. (Patient not taking: Reported on 08/28/2022)     OXYGEN Inhale 3 L into the lungs daily.      pantoprazole (PROTONIX) 40 MG tablet TAKE 1 TABLET(40 MG) BY MOUTH TWICE DAILY AS NEEDED 180 tablet 0   patiromer (VELTASSA) 8.4 g packet Take 1 packet (8.4 g total) by mouth every Monday, Wednesday, and Friday. (Patient not taking: Reported on 08/28/2022) 30 each 0   pregabalin (LYRICA) 50 MG capsule Take 1 capsule (50 mg total) by mouth daily. 30 capsule 0   silver sulfADIAZINE (SILVADENE) 1 % cream Apply 1 Application topically daily. 50 g 0   topiramate (TOPAMAX) 50 MG tablet TAKE 1 TABLET(50 MG) BY MOUTH TWICE DAILY 180 tablet 0   torsemide (DEMADEX) 20 MG tablet TAKE 2 TABLETS(40 MG) BY MOUTH DAILY 180 tablet 0   traZODone (DESYREL) 100 MG tablet Take 1 tablet (100 mg total) by mouth at bedtime. 30 tablet 0   No current facility-administered medications for this visit.    Allergies  Allergen Reactions   Atorvastatin     Joint Aches - Severe Joint Aches - Severe Joint Aches - Severe   Avelox [Moxifloxacin Hcl In Nacl]     Muscle pain   Buprenorphine     Mouth sores, confusion, shaking   Dilaudid [Hydromorphone Hcl] Hives   Fluoxetine Itching   Levofloxacin Other (See Comments)    Joint Pain  Morphine And Related    Other     Muscle pain   Suboxone [Buprenorphine Hcl-Naloxone Hcl] Other (See Comments)    Rash and confused   Vancomycin     Renal insufficiency    Past Medical History:  Diagnosis Date   Abdominal abscess    a.) chronic MRSA infection   Acute pancreatitis    Anxiety    Asthma    Brain aneurysm    a.) 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 b.) RIGHT crainotomy for clip ligation 05/14/2019.   CHF (congestive heart failure) (HCC)    Chronic back pain    Chronic heart failure with preserved ejection fraction (HFpEF) ([place])    a. 11/2021 Echo: EF 55-60%, no rwma, mod LVH, GrI DD. Nl RV size/fxn. Mildly dil LA.   CKD (chronic kidney disease), stage V (HCC)    Depression    Emphysema of  lung (HCC)    Erectile dysfunction    Followed by palliative care service    GERD (gastroesophageal reflux disease)    History of methicillin resistant staphylococcus aureus (MRSA)    HLD (hyperlipidemia)    Hypertension    Mild cognitive impairment    MRSA (methicillin resistant Staphylococcus aureus)    Obesity    OSA (obstructive sleep apnea) 2013   a.) not compliant with nocturnal PAP therapy; CPAP machine "lost or stolen".   Panic disorder    Perforated bowel ([place]) 12/10/2005   Tempoary Colostomy Bag, Skin Graft for Abd wound   Polysubstance abuse ([place])    a.) ETOH, tobacco, marijuana, methamphetamines, cocaine, BZO, opioids.   Subarachnoid hemorrhage ([place]) 01/26/2010   a.) 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   T2DM (type 2 diabetes mellitus) ([place])    Tobacco abuse    Type 2 diabetes mellitus without complication, without long-term current use of insulin ([place]) 04/17/2016   Vitreous hemorrhage ([place]) 03/27/2011   Overview:  Bilateral; 01/2010, from [facility]     There were no vitals taken for this visit.   No problem-specific Assessment & Plan notes found for this encounter.   Tommy Medal PharmD CPP [facility] [address] [place], [place] [address] [phone]

## 2022-09-04 ENCOUNTER — Ambulatory Visit: Payer: Medicare Other | Admitting: Medical

## 2022-09-06 ENCOUNTER — Emergency Department: Payer: Medicare Other

## 2022-09-06 ENCOUNTER — Other Ambulatory Visit: Payer: Self-pay

## 2022-09-06 ENCOUNTER — Inpatient Hospital Stay
Admission: EM | Admit: 2022-09-06 | Discharge: 2022-09-09 | DRG: 917 | Disposition: A | Discharge status: Home / Self Care | Payer: Medicare Other | Attending: Family Medicine | Admitting: Family Medicine

## 2022-09-06 DIAGNOSIS — M1711 Unilateral primary osteoarthritis, right knee: Secondary | ICD-10-CM | POA: Diagnosis present

## 2022-09-06 DIAGNOSIS — Z825 Family history of asthma and other chronic lower respiratory diseases: Secondary | ICD-10-CM

## 2022-09-06 DIAGNOSIS — N2581 Secondary hyperparathyroidism of renal origin: Secondary | ICD-10-CM | POA: Diagnosis present

## 2022-09-06 DIAGNOSIS — F17210 Nicotine dependence, cigarettes, uncomplicated: Secondary | ICD-10-CM | POA: Diagnosis present

## 2022-09-06 DIAGNOSIS — I5030 Unspecified diastolic (congestive) heart failure: Secondary | ICD-10-CM

## 2022-09-06 DIAGNOSIS — Z841 Family history of disorders of kidney and ureter: Secondary | ICD-10-CM

## 2022-09-06 DIAGNOSIS — Z881 Allergy status to other antibiotic agents status: Secondary | ICD-10-CM

## 2022-09-06 DIAGNOSIS — D631 Anemia in chronic kidney disease: Secondary | ICD-10-CM | POA: Diagnosis present

## 2022-09-06 DIAGNOSIS — I493 Ventricular premature depolarization: Secondary | ICD-10-CM | POA: Diagnosis present

## 2022-09-06 DIAGNOSIS — Z885 Allergy status to narcotic agent status: Secondary | ICD-10-CM

## 2022-09-06 DIAGNOSIS — E1122 Type 2 diabetes mellitus with diabetic chronic kidney disease: Secondary | ICD-10-CM | POA: Diagnosis present

## 2022-09-06 DIAGNOSIS — Z7982 Long term (current) use of aspirin: Secondary | ICD-10-CM

## 2022-09-06 DIAGNOSIS — G928 Other toxic encephalopathy: Secondary | ICD-10-CM | POA: Diagnosis present

## 2022-09-06 DIAGNOSIS — T405X1A Poisoning by cocaine, accidental (unintentional), initial encounter: Secondary | ICD-10-CM | POA: Diagnosis not present

## 2022-09-06 DIAGNOSIS — E1129 Type 2 diabetes mellitus with other diabetic kidney complication: Secondary | ICD-10-CM | POA: Diagnosis present

## 2022-09-06 DIAGNOSIS — E785 Hyperlipidemia, unspecified: Secondary | ICD-10-CM | POA: Diagnosis present

## 2022-09-06 DIAGNOSIS — Y92480 Sidewalk as the place of occurrence of the external cause: Secondary | ICD-10-CM

## 2022-09-06 DIAGNOSIS — Z8349 Family history of other endocrine, nutritional and metabolic diseases: Secondary | ICD-10-CM

## 2022-09-06 DIAGNOSIS — Z8261 Family history of arthritis: Secondary | ICD-10-CM

## 2022-09-06 DIAGNOSIS — M7918 Myalgia, other site: Secondary | ICD-10-CM

## 2022-09-06 DIAGNOSIS — Z6836 Body mass index (BMI) 36.0-36.9, adult: Secondary | ICD-10-CM

## 2022-09-06 DIAGNOSIS — Z79899 Other long term (current) drug therapy: Secondary | ICD-10-CM

## 2022-09-06 DIAGNOSIS — W010XXA Fall on same level from slipping, tripping and stumbling without subsequent striking against object, initial encounter: Secondary | ICD-10-CM | POA: Diagnosis present

## 2022-09-06 DIAGNOSIS — J9621 Acute and chronic respiratory failure with hypoxia: Secondary | ICD-10-CM

## 2022-09-06 DIAGNOSIS — Z9981 Dependence on supplemental oxygen: Secondary | ICD-10-CM

## 2022-09-06 DIAGNOSIS — J9622 Acute and chronic respiratory failure with hypercapnia: Secondary | ICD-10-CM | POA: Diagnosis present

## 2022-09-06 DIAGNOSIS — Z7951 Long term (current) use of inhaled steroids: Secondary | ICD-10-CM

## 2022-09-06 DIAGNOSIS — W19XXXA Unspecified fall, initial encounter: Principal | ICD-10-CM

## 2022-09-06 DIAGNOSIS — Y92009 Unspecified place in unspecified non-institutional (private) residence as the place of occurrence of the external cause: Secondary | ICD-10-CM

## 2022-09-06 DIAGNOSIS — Z888 Allergy status to other drugs, medicaments and biological substances status: Secondary | ICD-10-CM

## 2022-09-06 DIAGNOSIS — G4733 Obstructive sleep apnea (adult) (pediatric): Secondary | ICD-10-CM

## 2022-09-06 DIAGNOSIS — G9341 Metabolic encephalopathy: Secondary | ICD-10-CM

## 2022-09-06 DIAGNOSIS — N186 End stage renal disease: Secondary | ICD-10-CM

## 2022-09-06 DIAGNOSIS — I132 Hypertensive heart and chronic kidney disease with heart failure and with stage 5 chronic kidney disease, or end stage renal disease: Secondary | ICD-10-CM | POA: Diagnosis present

## 2022-09-06 DIAGNOSIS — R410 Disorientation, unspecified: Secondary | ICD-10-CM

## 2022-09-06 DIAGNOSIS — K219 Gastro-esophageal reflux disease without esophagitis: Secondary | ICD-10-CM | POA: Diagnosis present

## 2022-09-06 DIAGNOSIS — G8929 Other chronic pain: Secondary | ICD-10-CM | POA: Diagnosis present

## 2022-09-06 DIAGNOSIS — J9620 Acute and chronic respiratory failure, unspecified whether with hypoxia or hypercapnia: Secondary | ICD-10-CM | POA: Diagnosis present

## 2022-09-06 DIAGNOSIS — I5032 Chronic diastolic (congestive) heart failure: Secondary | ICD-10-CM

## 2022-09-06 DIAGNOSIS — Z992 Dependence on renal dialysis: Secondary | ICD-10-CM

## 2022-09-06 DIAGNOSIS — J439 Emphysema, unspecified: Secondary | ICD-10-CM | POA: Diagnosis present

## 2022-09-06 DIAGNOSIS — J449 Chronic obstructive pulmonary disease, unspecified: Secondary | ICD-10-CM

## 2022-09-06 DIAGNOSIS — Z833 Family history of diabetes mellitus: Secondary | ICD-10-CM

## 2022-09-06 DIAGNOSIS — F1910 Other psychoactive substance abuse, uncomplicated: Secondary | ICD-10-CM

## 2022-09-06 DIAGNOSIS — Z8249 Family history of ischemic heart disease and other diseases of the circulatory system: Secondary | ICD-10-CM

## 2022-09-06 DIAGNOSIS — Z1152 Encounter for screening for COVID-19: Secondary | ICD-10-CM

## 2022-09-06 DIAGNOSIS — M25561 Pain in right knee: Secondary | ICD-10-CM

## 2022-09-06 DIAGNOSIS — Z818 Family history of other mental and behavioral disorders: Secondary | ICD-10-CM

## 2022-09-06 LAB — CBC WITH DIFFERENTIAL/PLATELET
Abs Immature Granulocytes: 0.06 10*3/uL (ref 0.00–0.07)
Basophils Absolute: 0.1 10*3/uL (ref 0.0–0.1)
Basophils Relative: 1 %
Eosinophils Absolute: 0.2 10*3/uL (ref 0.0–0.5)
Eosinophils Relative: 2 %
HCT: 40 % (ref 39.0–52.0)
Hemoglobin: 13.1 g/dL (ref 13.0–17.0)
Immature Granulocytes: 1 %
Lymphocytes Relative: 16 %
Lymphs Abs: 1.6 10*3/uL (ref 0.7–4.0)
MCH: 29.8 pg (ref 26.0–34.0)
MCHC: 32.8 g/dL (ref 30.0–36.0)
MCV: 90.9 fL (ref 80.0–100.0)
Monocytes Absolute: 0.8 10*3/uL (ref 0.1–1.0)
Monocytes Relative: 8 %
Neutro Abs: 7.2 10*3/uL (ref 1.7–7.7)
Neutrophils Relative %: 72 %
Platelets: 262 10*3/uL (ref 150–400)
RBC: 4.4 MIL/uL (ref 4.22–5.81)
RDW: 14 % (ref 11.5–15.5)
WBC: 10 10*3/uL (ref 4.0–10.5)
nRBC: 0 % (ref 0.0–0.2)

## 2022-09-06 LAB — SARS CORONAVIRUS 2 BY RT PCR: SARS Coronavirus 2 by RT PCR: NEGATIVE

## 2022-09-06 LAB — TROPONIN I (HIGH SENSITIVITY): Troponin I (High Sensitivity): 15 ng/L (ref <18)

## 2022-09-06 LAB — BLOOD GAS, ARTERIAL
Acid-Base Excess: 5 mmol/L — ABNORMAL HIGH (ref 0.0–2.0)
Bicarbonate: 31 mmol/L — ABNORMAL HIGH (ref 20.0–28.0)
O2 Content: 2.5 L/min
O2 Saturation: 93.1 %
Patient temperature: 37
pCO2 arterial: 50 mmHg — ABNORMAL HIGH (ref 32–48)
pH, Arterial: 7.4 (ref 7.35–7.45)
pO2, Arterial: 66 mmHg — ABNORMAL LOW (ref 83–108)

## 2022-09-06 LAB — COMPREHENSIVE METABOLIC PANEL
ALT: 16 U/L (ref 0–44)
AST: 26 U/L (ref 15–41)
Albumin: 5 g/dL (ref 3.5–5.0)
Alkaline Phosphatase: 92 U/L (ref 38–126)
Anion gap: 16 — ABNORMAL HIGH (ref 5–15)
BUN: 34 mg/dL — ABNORMAL HIGH (ref 6–20)
CO2: 29 mmol/L (ref 22–32)
Calcium: 9.3 mg/dL (ref 8.9–10.3)
Chloride: 95 mmol/L — ABNORMAL LOW (ref 98–111)
Creatinine, Ser: 4.75 mg/dL — ABNORMAL HIGH (ref 0.61–1.24)
GFR, Estimated: 14 mL/min — ABNORMAL LOW (ref >60)
Glucose, Bld: 103 mg/dL — ABNORMAL HIGH (ref 70–99)
Potassium: 3.7 mmol/L (ref 3.5–5.1)
Sodium: 140 mmol/L (ref 135–145)
Total Bilirubin: 1.5 mg/dL — ABNORMAL HIGH (ref 0.3–1.2)
Total Protein: 9.1 g/dL — ABNORMAL HIGH (ref 6.5–8.1)

## 2022-09-06 MED ORDER — CYCLOBENZAPRINE HCL 10 MG PO TABS
5.0000 mg | ORAL_TABLET | Freq: Once | ORAL | Status: AC
  Administered 2022-09-06: 5 mg via ORAL
  Filled 2022-09-06: qty 1

## 2022-09-06 NOTE — Discharge Instructions (Addendum)
Please seek medical attention for any high fevers, chest pain, shortness of breath, change in behavior, persistent vomiting, bloody stool or any other new or concerning symptoms. Advised to follow-up with primary care physician in 1 week.   Advised to follow-up with nephrology for continuation of hemodialysis. Advised to continue current medications as prescribed

## 2022-09-06 NOTE — ED Provider Notes (Signed)
[facility] Provider Note    Event Date/Time   First MD Initiated Contact with Patient 09/06/22 1830     (approximate)   History   Fall   HPI  Derek Cooley. is a 56 y.o. male  who presents to the emergency department today because of concern for a fall.  The patient states that he had gone outside because he heard some dogs attacking each other.  He then slipped off the side of the sidewalk and twisted his right leg and hit his left shoulder during the fall.  He states he did not hit his head.  He is primarily complaining of left shoulder and right knee pain.  Physical Exam   Triage Vital Signs: ED Triage Vitals  Enc Vitals Group     BP 09/06/22 1614 (!) 146/81     Pulse Rate 09/06/22 1614 70     Resp 09/06/22 1614 20     Temp 09/06/22 1614 98.2 F (36.8 C)     Temp Source 09/06/22 1614 Oral     SpO2 09/06/22 1614 96 %     Weight 09/06/22 1610 233 lb (105.7 kg)     Height 09/06/22 1610 5\' 7"  (1.702 m)     Head Circumference --      Peak Flow --      Pain Score 09/06/22 1610 7     Pain Loc --      Pain Edu? --      Excl. in [place]? --     Most recent vital signs: Vitals:   09/06/22 1900 09/06/22 1930  BP: 137/87 126/83  Pulse: 78 73  Resp: 19 (!) 25  Temp:  98.4 F (36.9 C)  SpO2: (!) 71% (!) 79%   General: Awake, alert, oriented. CV:  Good peripheral perfusion. Regular rate and rhythm Resp:  Normal effort. Lungs clear. Abd:  No distention.  Other:  No deformity of the extremities. Mild tenderness to manipulation of the left shoulder and right knee.   ED Results / Procedures / Treatments   Labs (all labs ordered are listed, but only abnormal results are displayed) Labs Reviewed  COMPREHENSIVE METABOLIC PANEL - Abnormal; Notable for the following components:      Result Value   Chloride 95 (*)    Glucose, Bld 103 (*)    BUN 34 (*)    Creatinine, Ser 4.75 (*)    Total Protein 9.1 (*)    Total Bilirubin 1.5 (*)    GFR,  Estimated 14 (*)    Anion gap 16 (*)    All other components within normal limits  CBC WITH DIFFERENTIAL/PLATELET  TROPONIN I (HIGH SENSITIVITY)  TROPONIN I (HIGH SENSITIVITY)     EKG  I, Nance Pear, attending physician, personally viewed and interpreted this EKG  EKG Time: 1626 Rate: 78 Rhythm: sinus rhythm Axis: left axis deviation Intervals: qtc 451 QRS: LAFB ST changes: no st elevation Impression: abnormal ekg  RADIOLOGY I independently interpreted and visualized the CT head/cervical spine. My interpretation: No intracranial bleed Radiology interpretation:  IMPRESSION:  1. Stable noncontrast head CT with no acute intracranial pathology.  2. No acute fracture or traumatic malalignment of the cervical  spine.  3. Chronic maxillary sinusitis with layering fluid which again  raises the possibility of acute sinusitis in the correct clinical  setting.    I independently interpreted and visualized the CXR. My interpretation: No pneumonia Radiology interpretation:    IMPRESSION:  No active cardiopulmonary  disease.   I independently interpreted and visualized the left shoulder. My interpretation: No acute osseous abnormality Radiology interpretation:  IMPRESSION:  No acute bony abnormality.   I independently interpreted and visualized the right knee. My interpretation: No acute osseous abnormality Radiology interpretation:  IMPRESSION:  No acute fracture or dislocation. Advanced tricompartmental  degenerative arthritis.    PROCEDURES:  Critical Care performed: No  Procedures   MEDICATIONS ORDERED IN ED: Medications  cyclobenzaprine (FLEXERIL) tablet 5 mg (has no administration in time range)     IMPRESSION / MDM / ASSESSMENT AND PLAN / ED COURSE  I reviewed the triage vital signs and the nursing notes.                              Differential diagnosis includes, but is not limited to, contusion, fracture, dislocation.  Patient's presentation is  most consistent with acute presentation with potential threat to life or bodily function.  Patient presented to the emergency department today after mechanical fall complaining primarily of right knee and the left shoulder pain.  CT scan of the head and cervical spine did not show any concerning abnormalities.  Did obtain x-rays of his left shoulder and right knee which did not show any concerning abnormalities either.  However after my initial examination while the patient was still in the emergency department he started become more confused.  Additionally the patient oxygen level did start to drop.  Patient did have to be increased over his baseline 3 L of O2.  Given the increased confusion further work-up was ordered.  Patient's sister was contacted over the telephone and did have some concerns for possible drug use as well.  Awaiting results of further work-up at time of signout.  FINAL CLINICAL IMPRESSION(S) / ED DIAGNOSES   Final diagnoses:  Fall, initial encounter  Musculoskeletal pain  Confusion    Note:  This document was prepared using Dragon voice recognition software and may include unintentional dictation errors.    Nance Pear, MD 09/06/22 2350

## 2022-09-06 NOTE — ED Triage Notes (Signed)
Pt arrives with c/o right knee pain after a fall and left shoulder. Pt endorses hitting his head.

## 2022-09-06 NOTE — ED Notes (Signed)
Patient to ED via [facility] from dialysis. Patient had a fall prior to dialysis this morning and is c/o right knee and left shoulder pain. Patient did receive his full dialysis today. Pt wears 3L [place] at baseline.  142/88 75 HR 98%

## 2022-09-06 NOTE — ED Notes (Signed)
Confused, pt has pocket knife out "trying to cut these wires off".  SaO2 continues to drop below 85% on 5lpm via [place] when RN not in room--pt is a mouth breather.  Pt states he does wear O2 at home. Provider notified and orders received.

## 2022-09-06 NOTE — ED Provider Triage Note (Signed)
Emergency Medicine Provider Triage Evaluation Note  Derek Cooley. , a 56 y.o. male  was evaluated in triage.  Patient has a history of hypertension, GERD, chronic back pain, subarachnoid hemorrhage, diabetes, chronic kidney disease and substance use, presenting to the emergency department after patient had a fall.  Patient states that he is required increased oxygen from 3 to 4 L and felt unsteady.  He states that his legs felt as though they gave out.  He states that he did hit his head and is having some neck pain.  He is also endorsing left shoulder discomfort.  No numbness or tingling in the upper and lower extremities.  Review of Systems  Positive: Patient has neck pain, shoulder pain and headache.  Negative: No abdominal pain.   Physical Exam  BP (!) 146/81   Pulse 70   Temp 98.2 F (36.8 C) (Oral)   Resp 20   Ht 5\' 7"  (1.702 m)   Wt 105.7 kg   SpO2 96%   BMI 36.49 kg/m  Gen:   Awake, no distress   Resp:  Normal effort  MSK:   Moves extremities without difficulty  Other:    Medical Decision Making  Medically screening exam initiated at 4:16 PM.  Appropriate orders placed.  Derek Cooley. was informed that the remainder of the evaluation will be completed by another provider, this initial triage assessment does not replace that evaluation, and the importance of remaining in the ED until their evaluation is complete.     Vallarie Mare [place], [place] 09/06/22 [phone]

## 2022-09-06 NOTE — ED Notes (Signed)
Wife gave verbal consent for transfer to [facility]

## 2022-09-07 ENCOUNTER — Encounter: Payer: Self-pay | Admitting: Internal Medicine

## 2022-09-07 ENCOUNTER — Observation Stay: Payer: Medicare Other

## 2022-09-07 DIAGNOSIS — N186 End stage renal disease: Secondary | ICD-10-CM

## 2022-09-07 DIAGNOSIS — G928 Other toxic encephalopathy: Secondary | ICD-10-CM | POA: Diagnosis present

## 2022-09-07 DIAGNOSIS — I493 Ventricular premature depolarization: Secondary | ICD-10-CM | POA: Diagnosis present

## 2022-09-07 DIAGNOSIS — Z6836 Body mass index (BMI) 36.0-36.9, adult: Secondary | ICD-10-CM | POA: Diagnosis not present

## 2022-09-07 DIAGNOSIS — Y92009 Unspecified place in unspecified non-institutional (private) residence as the place of occurrence of the external cause: Secondary | ICD-10-CM

## 2022-09-07 DIAGNOSIS — Z9981 Dependence on supplemental oxygen: Secondary | ICD-10-CM | POA: Diagnosis not present

## 2022-09-07 DIAGNOSIS — J9621 Acute and chronic respiratory failure with hypoxia: Secondary | ICD-10-CM | POA: Diagnosis present

## 2022-09-07 DIAGNOSIS — G4733 Obstructive sleep apnea (adult) (pediatric): Secondary | ICD-10-CM | POA: Diagnosis present

## 2022-09-07 DIAGNOSIS — W010XXA Fall on same level from slipping, tripping and stumbling without subsequent striking against object, initial encounter: Secondary | ICD-10-CM | POA: Diagnosis present

## 2022-09-07 DIAGNOSIS — Y92480 Sidewalk as the place of occurrence of the external cause: Secondary | ICD-10-CM | POA: Diagnosis not present

## 2022-09-07 DIAGNOSIS — T405X1A Poisoning by cocaine, accidental (unintentional), initial encounter: Secondary | ICD-10-CM | POA: Diagnosis present

## 2022-09-07 DIAGNOSIS — I132 Hypertensive heart and chronic kidney disease with heart failure and with stage 5 chronic kidney disease, or end stage renal disease: Secondary | ICD-10-CM | POA: Diagnosis present

## 2022-09-07 DIAGNOSIS — Z992 Dependence on renal dialysis: Secondary | ICD-10-CM | POA: Diagnosis not present

## 2022-09-07 DIAGNOSIS — F1910 Other psychoactive substance abuse, uncomplicated: Secondary | ICD-10-CM

## 2022-09-07 DIAGNOSIS — G9341 Metabolic encephalopathy: Secondary | ICD-10-CM

## 2022-09-07 DIAGNOSIS — M25561 Pain in right knee: Secondary | ICD-10-CM

## 2022-09-07 DIAGNOSIS — E1122 Type 2 diabetes mellitus with diabetic chronic kidney disease: Secondary | ICD-10-CM | POA: Diagnosis present

## 2022-09-07 DIAGNOSIS — J9622 Acute and chronic respiratory failure with hypercapnia: Secondary | ICD-10-CM | POA: Diagnosis present

## 2022-09-07 DIAGNOSIS — J9620 Acute and chronic respiratory failure, unspecified whether with hypoxia or hypercapnia: Secondary | ICD-10-CM | POA: Diagnosis present

## 2022-09-07 DIAGNOSIS — K219 Gastro-esophageal reflux disease without esophagitis: Secondary | ICD-10-CM | POA: Diagnosis present

## 2022-09-07 DIAGNOSIS — J439 Emphysema, unspecified: Secondary | ICD-10-CM | POA: Diagnosis present

## 2022-09-07 DIAGNOSIS — M1711 Unilateral primary osteoarthritis, right knee: Secondary | ICD-10-CM | POA: Diagnosis present

## 2022-09-07 DIAGNOSIS — G8929 Other chronic pain: Secondary | ICD-10-CM | POA: Diagnosis present

## 2022-09-07 DIAGNOSIS — D631 Anemia in chronic kidney disease: Secondary | ICD-10-CM | POA: Diagnosis present

## 2022-09-07 DIAGNOSIS — E785 Hyperlipidemia, unspecified: Secondary | ICD-10-CM | POA: Diagnosis present

## 2022-09-07 DIAGNOSIS — N2581 Secondary hyperparathyroidism of renal origin: Secondary | ICD-10-CM | POA: Diagnosis present

## 2022-09-07 DIAGNOSIS — R410 Disorientation, unspecified: Secondary | ICD-10-CM | POA: Diagnosis present

## 2022-09-07 DIAGNOSIS — Z1152 Encounter for screening for COVID-19: Secondary | ICD-10-CM | POA: Diagnosis not present

## 2022-09-07 DIAGNOSIS — Z881 Allergy status to other antibiotic agents status: Secondary | ICD-10-CM | POA: Diagnosis not present

## 2022-09-07 DIAGNOSIS — F17210 Nicotine dependence, cigarettes, uncomplicated: Secondary | ICD-10-CM | POA: Diagnosis present

## 2022-09-07 DIAGNOSIS — Z885 Allergy status to narcotic agent status: Secondary | ICD-10-CM | POA: Diagnosis not present

## 2022-09-07 LAB — URINE DRUG SCREEN, QUALITATIVE (ARMC ONLY)
Amphetamines, Ur Screen: POSITIVE — AB
Barbiturates, Ur Screen: NOT DETECTED
Benzodiazepine, Ur Scrn: NOT DETECTED
Cannabinoid 50 Ng, Ur ~~LOC~~: NOT DETECTED
Cocaine Metabolite,Ur ~~LOC~~: POSITIVE — AB
MDMA (Ecstasy)Ur Screen: NOT DETECTED
Methadone Scn, Ur: NOT DETECTED
Opiate, Ur Screen: NOT DETECTED
Phencyclidine (PCP) Ur S: NOT DETECTED
Tricyclic, Ur Screen: NOT DETECTED

## 2022-09-07 LAB — HEPATITIS B SURFACE ANTIBODY,QUALITATIVE: Hep B S Ab: NONREACTIVE

## 2022-09-07 LAB — URINALYSIS, ROUTINE W REFLEX MICROSCOPIC
Bilirubin Urine: NEGATIVE
Glucose, UA: NEGATIVE mg/dL
Hgb urine dipstick: NEGATIVE
Ketones, ur: NEGATIVE mg/dL
Leukocytes,Ua: NEGATIVE
Nitrite: NEGATIVE
Protein, ur: 100 mg/dL — AB
Specific Gravity, Urine: 1.01 (ref 1.005–1.030)
pH: 5 (ref 5.0–8.0)

## 2022-09-07 LAB — HEPATITIS C ANTIBODY: HCV Ab: NONREACTIVE

## 2022-09-07 LAB — HEPATITIS B SURFACE ANTIGEN: Hepatitis B Surface Ag: NONREACTIVE

## 2022-09-07 LAB — D-DIMER, QUANTITATIVE: D-Dimer, Quant: 1.54 ug/mL-FEU — ABNORMAL HIGH (ref 0.00–0.50)

## 2022-09-07 LAB — CBG MONITORING, ED
Glucose-Capillary: 113 mg/dL — ABNORMAL HIGH (ref 70–99)
Glucose-Capillary: 94 mg/dL (ref 70–99)

## 2022-09-07 LAB — TROPONIN I (HIGH SENSITIVITY): Troponin I (High Sensitivity): 17 ng/L (ref <18)

## 2022-09-07 LAB — HEPATITIS B CORE ANTIBODY, TOTAL: Hep B Core Total Ab: NONREACTIVE

## 2022-09-07 LAB — CBC
HCT: 36.8 % — ABNORMAL LOW (ref 39.0–52.0)
Hemoglobin: 12.2 g/dL — ABNORMAL LOW (ref 13.0–17.0)
MCH: 30.1 pg (ref 26.0–34.0)
MCHC: 33.2 g/dL (ref 30.0–36.0)
MCV: 90.9 fL (ref 80.0–100.0)
Platelets: 231 10*3/uL (ref 150–400)
RBC: 4.05 MIL/uL — ABNORMAL LOW (ref 4.22–5.81)
RDW: 13.8 % (ref 11.5–15.5)
WBC: 8.8 10*3/uL (ref 4.0–10.5)
nRBC: 0 % (ref 0.0–0.2)

## 2022-09-07 LAB — GLUCOSE, CAPILLARY
Glucose-Capillary: 92 mg/dL (ref 70–99)
Glucose-Capillary: 96 mg/dL (ref 70–99)
Glucose-Capillary: 85 mg/dL (ref 70–99)
Glucose-Capillary: 77 mg/dL (ref 70–99)

## 2022-09-07 LAB — CREATININE, SERUM
Creatinine, Ser: 5.3 mg/dL — ABNORMAL HIGH (ref 0.61–1.24)
GFR, Estimated: 12 mL/min — ABNORMAL LOW (ref >60)

## 2022-09-07 LAB — ETHANOL: Alcohol, Ethyl (B): <10 mg/dL (ref <10)

## 2022-09-07 LAB — AMMONIA: Ammonia: 29 umol/L (ref 9–35)

## 2022-09-07 MED ORDER — PENTAFLUOROPROP-TETRAFLUOROETH EX AERO: 1.0000 | INHALATION_SPRAY | CUTANEOUS | Status: DC | PRN

## 2022-09-07 MED ORDER — HEPARIN SODIUM (PORCINE) 1000 UNIT/ML DIALYSIS
1000.0000 [IU] | INTRAMUSCULAR | Status: DC | PRN
  Administered 2022-09-07: 1000 [IU]

## 2022-09-07 MED ORDER — LIDOCAINE-PRILOCAINE 2.5-2.5 % EX CREA: 1.0000 | TOPICAL_CREAM | CUTANEOUS | Status: DC | PRN

## 2022-09-07 MED ORDER — LIDOCAINE HCL (PF) 1 % IJ SOLN: 5.0000 mL | INTRAMUSCULAR | Status: DC | PRN

## 2022-09-07 MED ORDER — INSULIN ASPART 100 UNIT/ML IJ SOLN
0.0000 [IU] | INTRAMUSCULAR | Status: DC
  Administered 2022-09-08: 8 [IU] via SUBCUTANEOUS
  Administered 2022-09-09: 4 [IU] via SUBCUTANEOUS
  Administered 2022-09-09 (×2): 3 [IU] via SUBCUTANEOUS
  Filled 2022-09-07 (×4): qty 1

## 2022-09-07 MED ORDER — ANTICOAGULANT SODIUM CITRATE 4% (200MG/5ML) IV SOLN: 5.0000 mL | Status: DC | PRN

## 2022-09-07 MED ORDER — ACETAMINOPHEN 325 MG PO TABS
650.0000 mg | ORAL_TABLET | Freq: Four times a day (QID) | ORAL | Status: DC | PRN
  Administered 2022-09-08 – 2022-09-09 (×3): 650 mg via ORAL
  Filled 2022-09-07 (×2): qty 2

## 2022-09-07 MED ORDER — ONDANSETRON HCL 4 MG PO TABS: 4.0000 mg | ORAL_TABLET | Freq: Four times a day (QID) | ORAL | Status: DC | PRN

## 2022-09-07 MED ORDER — NICOTINE 21 MG/24HR TD PT24
21.0000 mg | MEDICATED_PATCH | Freq: Every day | TRANSDERMAL | Status: DC
  Administered 2022-09-07 – 2022-09-09 (×3): 21 mg via TRANSDERMAL
  Filled 2022-09-07 (×4): qty 1

## 2022-09-07 MED ORDER — HEPARIN SODIUM (PORCINE) 5000 UNIT/ML IJ SOLN
5000.0000 [IU] | Freq: Three times a day (TID) | INTRAMUSCULAR | Status: DC
  Administered 2022-09-07 – 2022-09-09 (×7): 5000 [IU] via SUBCUTANEOUS
  Filled 2022-09-07 (×8): qty 1

## 2022-09-07 MED ORDER — DIPHENHYDRAMINE HCL 50 MG/ML IJ SOLN
50.0000 mg | Freq: Once | INTRAMUSCULAR | Status: AC
  Administered 2022-09-07: 50 mg via INTRAVENOUS

## 2022-09-07 MED ORDER — ALTEPLASE 2 MG IJ SOLR: 2.0000 mg | Freq: Once | INTRAMUSCULAR | Status: DC | PRN

## 2022-09-07 MED ORDER — ONDANSETRON HCL 4 MG/2ML IJ SOLN: 4.0000 mg | Freq: Four times a day (QID) | INTRAMUSCULAR | Status: DC | PRN

## 2022-09-07 MED ORDER — ACETAMINOPHEN 650 MG RE SUPP: 650.0000 mg | Freq: Four times a day (QID) | RECTAL | Status: DC | PRN

## 2022-09-07 MED ORDER — TECHNETIUM TO 99M ALBUMIN AGGREGATED: 4.1300 | Freq: Once | INTRAVENOUS | Status: DC | PRN

## 2022-09-07 MED ORDER — DIPHENHYDRAMINE HCL 50 MG/ML IJ SOLN
INTRAMUSCULAR | Status: AC
  Filled 2022-09-07: qty 1

## 2022-09-07 MED ORDER — HEPARIN SODIUM (PORCINE) 1000 UNIT/ML IJ SOLN
INTRAMUSCULAR | Status: AC
  Filled 2022-09-07: qty 10

## 2022-09-07 MED ORDER — LORAZEPAM 2 MG/ML IJ SOLN
1.0000 mg | Freq: Once | INTRAMUSCULAR | Status: AC
  Administered 2022-09-07: 1 mg via INTRAVENOUS
  Filled 2022-09-07: qty 1

## 2022-09-07 MED ORDER — CHLORHEXIDINE GLUCONATE CLOTH 2 % EX PADS
6.0000 | MEDICATED_PAD | Freq: Every day | CUTANEOUS | Status: DC
  Administered 2022-09-08 – 2022-09-09 (×2): 6 via TOPICAL

## 2022-09-07 MED ORDER — ALBUTEROL SULFATE (2.5 MG/3ML) 0.083% IN NEBU: 2.5000 mg | INHALATION_SOLUTION | RESPIRATORY_TRACT | Status: DC | PRN

## 2022-09-07 NOTE — ED Notes (Addendum)
Pt very restless at this time. RN placed monitor leads back on pt and secured IV lines with coban. Pt is A/Ox1 at this time. RN completed neuro assessment. Please refer to neuro flow sheet.

## 2022-09-07 NOTE — ED Notes (Signed)
RN contacted CT about PE study not being completed. Per CT nephrology needs to give the approval for CT contrast to be used. RN contacting hospitalist and nephrologist.

## 2022-09-07 NOTE — Progress Notes (Signed)
   09/07/22 1814  Escalate  MEWS: Escalate Yellow: discuss with charge nurse/RN and consider discussing with provider and RRT  Notify: Charge Nurse/RN  Name of Charge Nurse/RN Notified Dennard Schaumann., RN.  Date Charge Nurse/RN Notified 09/07/22  Time Charge Nurse/RN Notified 1815  Notify: Provider  Provider Name/Title Shawna Clamp  Date Provider Notified 09/07/22  Time Provider Notified 1815  Method of Notification Page

## 2022-09-07 NOTE — Progress Notes (Signed)
Pt with elevated Bp, restless and requesting cigarettes. According to sister (at bedside), pt smokes 2 packs of cigarettes per day. MD notified. New order given for nicotine patch.

## 2022-09-07 NOTE — ED Notes (Signed)
Pt speaking (about sitting in bleachers at football game, working on a car's battery) to someone not seen by this Therapist, sports.  When asked about it he mumbles "just a friend". Encouraged pt to take deep breaths in thru nose and out thru mouth to increase SaO2... return demonstration noted.  SaO2 increased.

## 2022-09-07 NOTE — Assessment & Plan Note (Signed)
Suspecting substance abuse related CT head negative for acute injury UDS positive for amphetamines and cocaine and patient hypoxic and hypercapnic No stigmata of acute infection Supplemental oxygen Neurologic checks with fall and aspiration precautions

## 2022-09-07 NOTE — ED Notes (Signed)
Continues to DC monitors and CPAP: continually reorienting pt with success for short amount of time.  Pt falls asleep and moves arms and hands above self as if to pick/grab at things. Monitors continue to alarm.Marland KitchenMarland Kitchen

## 2022-09-07 NOTE — ED Notes (Signed)
Pt sitting on side of bed, CPAP off,  monitors alarming or disconnected.  Instructed pt back into bed, CPAP and monitors reattached.  Vital signs retaken and documented.

## 2022-09-07 NOTE — Assessment & Plan Note (Signed)
Nonnarcotic pain control given altered mental status

## 2022-09-07 NOTE — Progress Notes (Signed)
   09/07/22 1752  Assess: MEWS Score  Temp 98.5 F (36.9 C)  BP (!) 145/93  MAP (mmHg) 110  Pulse Rate 89  Level of Consciousness Alert  SpO2 92 %  O2 Device Nasal Cannula  Assess: MEWS Score  MEWS Temp 0  MEWS Systolic 0  MEWS Pulse 0  MEWS RR 2  MEWS LOC 0  MEWS Score 2  MEWS Score Color Yellow  Assess: if the MEWS score is Yellow or Red  Were vital signs taken at a resting state? No  Focused Assessment No change from prior assessment  Does the patient meet 2 or more of the SIRS criteria? No  MEWS guidelines implemented *See Row Information* Yes  Treat  MEWS Interventions Administered scheduled meds/treatments (Nicotine patch)  Pain Scale 0-10  Pain Score 0  Take Vital Signs  Increase Vital Sign Frequency  Yellow: Q 2hr X 2 then Q 4hr X 2, if remains yellow, continue Q 4hrs  Assess: SIRS CRITERIA  SIRS Temperature  0  SIRS Pulse 0  SIRS Respirations  1  SIRS WBC 1  SIRS Score Sum  2

## 2022-09-07 NOTE — Assessment & Plan Note (Addendum)
Etiology uncertain ABG with PCO2 50, normal pH.  O2 sats in the 70s on home flow rate of oxygen We will get a D-dimer and if negative rules out PE, otherwise will get VQ scan  (Per CT tech, once less than 6 months from initiation of dialysis needs nephrology clearance prior to getting a contrasted study in the event of renal recovery ) Supplemental O2 to keep sats over 90%

## 2022-09-07 NOTE — Assessment & Plan Note (Signed)
UDS positive for cocaine and amphetamines For counseling on abstinence when more awake and alert

## 2022-09-07 NOTE — Assessment & Plan Note (Signed)
cpap qhs

## 2022-09-07 NOTE — ED Notes (Signed)
Reoriented, unsuccessfully. Pt changed into gown, clothes placed into bag and sat at bedside, with pts own O2 machine and small duffel bag.

## 2022-09-07 NOTE — Assessment & Plan Note (Signed)
Sliding scale insulin coverage 

## 2022-09-07 NOTE — Assessment & Plan Note (Signed)
Clinically euvolemic Continue carvedilol, hydralazine and torsemide

## 2022-09-07 NOTE — Progress Notes (Signed)
Patient continues to roll and try and sit up in bed, patient has been repositioned and made comfortable. Monitoring closely.

## 2022-09-07 NOTE — Progress Notes (Addendum)
[facility]  ROUNDING NOTE   Subjective:   Derek Cooley. is a 56 year old male with past medical conditions including COPD on home oxygen at 3 L, diabetes, struct of sleep apnea, anemia, hypertension, tobacco use, polysubstance abuse, CHF, end-stage renal disease on hemodialysis.  Patient presents to the emergency room after suffering a fall and complaining of right leg and left shoulder pain.  Patient has been admitted for Confusion [R41.0] Musculoskeletal pain [M79.18] Fall, initial encounter [W19.XXXA] Acute on chronic respiratory failure with hypoxia (HCC) [J96.21] Acute on chronic respiratory failure ([place]) [J96.20]  Patient is known to our practice and receives outpatient dialysis treatments at [facility] on a TTS schedule, supervised by Dr. Holley Raring.  Patient received a partial treatment yesterday, 2 hours.  Patient states he lost his footing and tripped on the sidewalk causing him to fall.  He states he fell awkwardly but did not hit his head.  Chart review states patient presented to the emergency department with confusion and shortness of breath.  Patient seen resting in bed, alert and oriented however easily distracted and appears anxious.  Does confirm he received partial dialysis treatment yesterday.  Blood serologies on ED arrival unremarkable for renal patient.  CT head and cervical spine negative.  Abdominal, knee, and left shoulder x-rays negative.  Urine toxicology GU positive for cocaine and amphetamines.  We have been consulted to manage dialysis needs during this admission.  Objective:  Vital signs in last 24 hours:  Temp:  [97.7 F (36.5 C)-98.8 F (37.1 C)] 97.8 F (36.6 C) (09/29 1400) Pulse Rate:  [52-138] 88 (09/29 1500) Resp:  [12-26] 17 (09/29 1500) BP: (107-172)/(61-145) 166/145 (09/29 1500) SpO2:  [71 %-100 %] 96 % (09/29 1500) Weight:  [105.7 kg] 105.7 kg (09/28 1610)  Weight change:  Filed Weights   09/06/22 1610  Weight: 105.7 kg     Intake/Output: No intake/output data recorded.   Intake/Output this shift:  Total I/O In: -  Out: 300 [Urine:300]  Physical Exam: General: NAD, anxious, restless  Head: Normocephalic, atraumatic. Moist oral mucosal membranes  Eyes: Anicteric  Lungs:  Clear to auscultation, normal effort  Heart: Regular rate and rhythm  Abdomen:  Soft, nontender  Extremities: No peripheral edema.  Neurologic: Nonfocal, moving all four extremities  Skin: No lesions  Access: Right chest PermCath, left lower aVF (maturing)    Basic Metabolic Panel: Recent Labs  Lab 09/06/22 1615 09/07/22 0403  NA 140  --   K 3.7  --   CL 95*  --   CO2 29  --   GLUCOSE 103*  --   BUN 34*  --   CREATININE 4.75* 5.30*  CALCIUM 9.3  --     Liver Function Tests: Recent Labs  Lab 09/06/22 1615  AST 26  ALT 16  ALKPHOS 92  BILITOT 1.5*  PROT 9.1*  ALBUMIN 5.0   No results for input(s): "LIPASE", "AMYLASE" in the last 168 hours. Recent Labs  Lab 09/06/22 2342  AMMONIA 29    CBC: Recent Labs  Lab 09/06/22 1615 09/07/22 0403  WBC 10.0 8.8  NEUTROABS 7.2  --   HGB 13.1 12.2*  HCT 40.0 36.8*  MCV 90.9 90.9  PLT 262 231    Cardiac Enzymes: No results for input(s): "CKTOTAL", "CKMB", "CKMBINDEX", "TROPONINI" in the last 168 hours.  BNP: Invalid input(s): "POCBNP"  CBG: Recent Labs  Lab 09/07/22 0356 09/07/22 0736 09/07/22 0924 09/07/22 1257  GLUCAP 113* 94 92 96  Microbiology: Results for orders placed or performed during the hospital encounter of 09/06/22  SARS Coronavirus 2 by RT PCR (hospital order, performed in [facility] hospital lab) *cepheid single result test* Anterior Nasal Swab     Status: None   Collection Time: 09/06/22 10:19 PM   Specimen: Anterior Nasal Swab  Result Value Ref Range Status   SARS Coronavirus 2 by RT PCR NEGATIVE NEGATIVE Final    Comment: (NOTE) SARS-CoV-2 target nucleic acids are NOT DETECTED.  The SARS-CoV-2 RNA is generally detectable  in upper and lower respiratory specimens during the acute phase of infection. The lowest concentration of SARS-CoV-2 viral copies this assay can detect is 250 copies / mL. A negative result does not preclude SARS-CoV-2 infection and should not be used as the sole basis for treatment or other patient management decisions.  A negative result may occur with improper specimen collection / handling, submission of specimen other than nasopharyngeal swab, presence of viral mutation(s) within the areas targeted by this assay, and inadequate number of viral copies (<250 copies / mL). A negative result must be combined with clinical observations, patient history, and epidemiological information.  Fact Sheet for Patients:   https://www.patel.info/  Fact Sheet for Healthcare Providers: https://hall.com/  This test is not yet approved or  cleared by the [place] FDA and has been authorized for detection and/or diagnosis of SARS-CoV-2 by FDA under an Emergency Use Authorization (EUA).  This EUA will remain in effect (meaning this test can be used) for the duration of the COVID-19 declaration under Section 564(b)(1) of the Act, 21 U.S.C. section 360bbb-3(b)(1), unless the authorization is terminated or revoked sooner.  Performed at [facility], [address] [place], [place] [address]     Coagulation Studies: No results for input(s): "LABPROT", "INR" in the last 72 hours.  Urinalysis: Recent Labs    09/06/22 2342  COLORURINE YELLOW*  LABSPEC 1.010  PHURINE 5.0  GLUCOSEU NEGATIVE  HGBUR NEGATIVE  BILIRUBINUR NEGATIVE  KETONESUR NEGATIVE  PROTEINUR 100*  NITRITE NEGATIVE  LEUKOCYTESUR NEGATIVE      Imaging: NM Pulmonary Perfusion  Result Date: 09/07/2022 CLINICAL DATA:  History of asthma and emphysema. EXAM: NUCLEAR MEDICINE PERFUSION LUNG SCAN TECHNIQUE: Perfusion images were obtained in multiple projections after  intravenous injection of radiopharmaceutical. Ventilation scans intentionally deferred if perfusion scan and chest x-ray adequate for interpretation during COVID 19 epidemic. RADIOPHARMACEUTICALS:  4.13 mCi Tc-71m MAA IV COMPARISON:  Chest radiograph dated September 06, 2022 FINDINGS: No segmental perfusion defect to suggest pulmonary embolism. IMPRESSION: Normal or low probability of pulmonary embolism. Electronically Signed   By: Keane Police D.O.   On: 09/07/2022 12:27   DG Knee Complete 4 Views Right  Result Date: 09/06/2022 CLINICAL DATA:  Right knee pain after fall EXAM: RIGHT KNEE - COMPLETE 4+ VIEW COMPARISON:  Radiographs 08/07/2019 FINDINGS: No acute fracture or dislocation. Advanced hypertrophic degenerative arthritis right knee with advanced tricompartmental joint space narrowing. Bone staples intact and unchanged in the distal femur and proximal tibia. Small knee joint effusion. IMPRESSION: No acute fracture or dislocation. Advanced tricompartmental degenerative arthritis. Electronically Signed   By: Placido Sou M.D.   On: 09/06/2022 20:03   DG Shoulder Left  Result Date: 09/06/2022 CLINICAL DATA:  Fall EXAM: LEFT SHOULDER - 2+ VIEW COMPARISON:  None Available. FINDINGS: Degenerative changes in the glenohumeral joint with spurring. No acute bony abnormality. Specifically, no fracture, subluxation, or dislocation. IMPRESSION: No acute bony abnormality. Electronically Signed   By: Rolm Baptise  M.D.   On: 09/06/2022 17:10   DG Chest 2 View  Result Date: 09/06/2022 CLINICAL DATA:  Fall EXAM: CHEST - 2 VIEW COMPARISON:  06/30/2022 FINDINGS: Right dialysis catheter remains in place, unchanged. Heart and mediastinal contours are within normal limits. No focal opacities or effusions. No acute bony abnormality. IMPRESSION: No active cardiopulmonary disease. Electronically Signed   By: Rolm Baptise M.D.   On: 09/06/2022 17:09   CT Head Wo Contrast  Result Date: 09/06/2022 CLINICAL DATA:  Fall  EXAM: CT HEAD WITHOUT CONTRAST CT CERVICAL SPINE WITHOUT CONTRAST TECHNIQUE: Multidetector CT imaging of the head and cervical spine was performed following the standard protocol without intravenous contrast. Multiplanar CT image reconstructions of the cervical spine were also generated. RADIATION DOSE REDUCTION: This exam was performed according to the departmental dose-optimization program which includes automated exposure control, adjustment of the mA and/or kV according to patient size and/or use of iterative reconstruction technique. COMPARISON:  CT head 06/23/2022, CT cervical spine 03/24/2019 FINDINGS: CT HEAD FINDINGS Brain: There is no acute intracranial hemorrhage, extra-axial fluid collection, or acute infarct. Parenchymal volume is normal. The ventricles are normal in size. There is encephalomalacia in the left anterior temporal lobe, unchanged. Small remote lacunar infarcts in the right thalamus, left lentiform nucleus, and right external capsule are noted. There is a small region of encephalomalacia in the left frontal lobe presumably related to a prior catheter given overlying skull defect. There is no solid mass lesion. There is dural thickening underlying the right-sided craniotomy, unchanged since the prior study. There is no mass effect or midline shift. 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 Vascular: No hyperdense vessel or unexpected calcification. Skull: Postsurgical changes reflecting right frontotemporal craniotomy are noted. Sinuses/Orbits: Mucosal thickening and layering fluid is again seen in the bilateral maxillary sinuses with associated hyperostosis. The globes and orbits are unremarkable. Other: None CT CERVICAL SPINE FINDINGS Alignment: There is no antero or retrolisthesis. There is no jumped or perched facet or other evidence of traumatic malalignment. Skull base and vertebrae: Skull base alignment is maintained. Vertebral body  heights are preserved. There is no evidence of acute fracture. There is no suspicious osseous lesion. Soft tissues and spinal canal: No prevertebral fluid or swelling. No visible canal hematoma. Disc levels: There is disc space narrowing and degenerative endplate change most advanced at C3-C4 and C5-C6, similar to the prior study. There is no evidence of high-grade spinal canal stenosis. Upper chest: The imaged lung apices are clear. Other: None. IMPRESSION: 1. Stable noncontrast head CT with no acute intracranial pathology. 2. No acute fracture or traumatic malalignment of the cervical spine. 3. Chronic maxillary sinusitis with layering fluid which again raises the possibility of acute sinusitis in the correct clinical setting. Electronically Signed   By: Valetta Mole M.D.   On: 09/06/2022 16:53   CT Cervical Spine Wo Contrast  Result Date: 09/06/2022 CLINICAL DATA:  Fall EXAM: CT HEAD WITHOUT CONTRAST CT CERVICAL SPINE WITHOUT CONTRAST TECHNIQUE: Multidetector CT imaging of the head and cervical spine was performed following the standard protocol without intravenous contrast. Multiplanar CT image reconstructions of the cervical spine were also generated. RADIATION DOSE REDUCTION: This exam was performed according to the departmental dose-optimization program which includes automated exposure control, adjustment of the mA and/or kV according to patient size and/or use of iterative reconstruction technique. COMPARISON:  CT head 06/23/2022, CT cervical spine 03/24/2019 FINDINGS: CT HEAD FINDINGS Brain: There is no acute intracranial hemorrhage, extra-axial fluid  collection, or acute infarct. Parenchymal volume is normal. The ventricles are normal in size. There is encephalomalacia in the left anterior temporal lobe, unchanged. Small remote lacunar infarcts in the right thalamus, left lentiform nucleus, and right external capsule are noted. There is a small region of encephalomalacia in the left frontal lobe  presumably related to a prior catheter given overlying skull defect. There is no solid mass lesion. There is dural thickening underlying the right-sided craniotomy, unchanged since the prior study. There is no mass effect or midline shift. 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 Vascular: No hyperdense vessel or unexpected calcification. Skull: Postsurgical changes reflecting right frontotemporal craniotomy are noted. Sinuses/Orbits: Mucosal thickening and layering fluid is again seen in the bilateral maxillary sinuses with associated hyperostosis. The globes and orbits are unremarkable. Other: None CT CERVICAL SPINE FINDINGS Alignment: There is no antero or retrolisthesis. There is no jumped or perched facet or other evidence of traumatic malalignment. Skull base and vertebrae: Skull base alignment is maintained. Vertebral body heights are preserved. There is no evidence of acute fracture. There is no suspicious osseous lesion. Soft tissues and spinal canal: No prevertebral fluid or swelling. No visible canal hematoma. Disc levels: There is disc space narrowing and degenerative endplate change most advanced at C3-C4 and C5-C6, similar to the prior study. There is no evidence of high-grade spinal canal stenosis. Upper chest: The imaged lung apices are clear. Other: None. IMPRESSION: 1. Stable noncontrast head CT with no acute intracranial pathology. 2. No acute fracture or traumatic malalignment of the cervical spine. 3. Chronic maxillary sinusitis with layering fluid which again raises the possibility of acute sinusitis in the correct clinical setting. Electronically Signed   By: Valetta Mole M.D.   On: 09/06/2022 16:53     Medications:    anticoagulant sodium citrate      Chlorhexidine Gluconate Cloth  6 each Topical Q0600   diphenhydrAMINE       heparin  5,000 Units Subcutaneous Q8H   heparin sodium (porcine)       insulin aspart  0-20 Units  Subcutaneous Q4H   acetaminophen **OR** acetaminophen, albuterol, alteplase, anticoagulant sodium citrate, diphenhydrAMINE, heparin, heparin sodium (porcine), lidocaine (PF), lidocaine-prilocaine, ondansetron **OR** ondansetron (ZOFRAN) IV, pentafluoroprop-tetrafluoroeth, technetium albumin aggregated  Assessment/ Plan:  [name] is a 56 y.o.  male with past medical conditions including COPD on home oxygen at 3 L, diabetes, struct of sleep apnea, anemia, hypertension, tobacco use, polysubstance abuse, CHF, end-stage renal disease on hemodialysis.  Patient presents to the emergency room after suffering a fall and complaining of right leg and left shoulder pain.  Patient has been admitted for Confusion [R41.0] Musculoskeletal pain [M79.18] Fall, initial encounter [W19.XXXA] Acute on chronic respiratory failure with hypoxia (HCC) [J96.21] Acute on chronic respiratory failure (HCC) [J96.20]  [facility] [place]/TTS/Rt Permcath  End-stage renal disease on hemodialysis.  Will maintain outpatient schedule if possible.  Patient received partial dialysis treatment at outpatient clinic yesterday.  We will schedule completion of treatment today.  Patient will receive next treatment tomorrow to maintain outpatient schedule.  2. Anemia of chronic kidney disease  Lab Results  Component Value Date   HGB 12.2 (L) 09/07/2022    Hemoglobin within desired target.  We will continue to monitor.  3. Secondary Hyperparathyroidism: with outpatient labs: PTH 130, phosphorus 4.6, calcium 8.8 on 06/18/22.   Lab Results  Component Value Date   PTH 135 (H) 12/07/2021   PTH Comment 12/07/2021  CALCIUM 9.3 09/06/2022   PHOS 4.2 11/28/2021  Calcium within acceptable target.  We will continue to monitor bone minerals during this admission.  4.  Hypertension with chronic kidney disease.  Home regimen includes amlodipine, carvedilol, hydralazine, and torsemide.  All currently held at this time.  Blood  pressure 118/84.   LOS: 0   9/29/20233:08 PM

## 2022-09-07 NOTE — Assessment & Plan Note (Signed)
Not acutely exacerbated DuoNebs as needed.  Continue home inhalers

## 2022-09-07 NOTE — ED Notes (Signed)
Per Dr. Zollie Scale with nephrology, He sts that since the pt is newer on dialysis he recommends a VQ scan to save the integrity of the kidney.

## 2022-09-07 NOTE — Progress Notes (Signed)
Patient blood pressure has been hard to get due to the patients arms flying around, patient continues to try and sit up and is all over the bed.

## 2022-09-07 NOTE — H&P (Addendum)
History and Physical    Patient: Derek Cooley. POE:[id number] DOB: 04/05/1966 DOA: 09/06/2022 DOS: the patient was seen and examined on 09/07/2022 PCP: Jon Billings, NP  Patient coming from: Home  Chief Complaint:  Chief Complaint  Patient presents with   Fall    HPI: Derek Cooley. is a 56 y.o. male with medical history significant for ESRD started on dialysis July 2023, COPD on home O2 at 3 L, DM, HTN, OSA on CPAP, anemia of CKD, depression with anxiety, tobacco use disorder, polysubstance abuse, HTN, HFpEF who presents to the ED following an accidental fall in which he tripped on the sidewalk twisting his right leg and hitting his left shoulder.  Patient reportedly did not hit his head.  While in the emergency room he became confused and was noted to be hypoxic and with an increased O2 requirement. ED course and data review: On initial arrival vitals were within normal limits with O2 sat 96% on O2 at home flow rate of 3 L.  After 3 hours in the ED he started desaturating to 71% which is when he was found confused and pulling off all the monitors from his body.  He required 5 L O2 via nasal cannula to maintain sats in the low 90s.  Other vitals remained within normal limits except for slightly elevated blood pressure with systolic in the 315Q. Labs ABG on 3 L showed pH 7.4 with PCO2 50 and PO2 66.  CBC completely WNL.  CMP with normal potassium and bicarb and otherwise as expected for dialysis patient.  COVID-negative.  Urinalysis unremarkable.  Troponin 17, ammonia 29 and EtOH less than 10.  UDS however positive for amphetamines and cocaine. EKG, personally viewed and interpreted showing sinus at 78 with PVCs, LAFB and nonspecific ST-T wave changes. Trauma imaging including CT head, CT C-spine, two-view chest, left shoulder and 4 view right knee with no acute finding. CTA chest was ordered to evaluate for PE however this was unable to be obtained without nephrology consultation  given that patient started dialysis within the past 3 months. Hospitalist consulted for admission.   Review of Systems: As mentioned in the history of present illness. All other systems reviewed and are negative.  Past Medical History:  Diagnosis Date   Abdominal abscess    a.) chronic MRSA infection   Acute pancreatitis    Anxiety    Asthma    Brain aneurysm    a.) 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 b.) RIGHT crainotomy for clip ligation 05/14/2019.   CHF (congestive heart failure) (HCC)    Chronic back pain    Chronic heart failure with preserved ejection fraction (HFpEF) ([place])    a. 11/2021 Echo: EF 55-60%, no rwma, mod LVH, GrI DD. Nl RV size/fxn. Mildly dil LA.   CKD (chronic kidney disease), stage V (HCC)    Depression    Emphysema of lung (HCC)    Erectile dysfunction    Followed by palliative care service    GERD (gastroesophageal reflux disease)    History of methicillin resistant staphylococcus aureus (MRSA)    HLD (hyperlipidemia)    Hypertension    Mild cognitive impairment    MRSA (methicillin resistant Staphylococcus aureus)    Obesity    OSA (obstructive sleep apnea) 2013   a.) not compliant with nocturnal PAP therapy; CPAP machine "lost or stolen".   Panic disorder    Perforated bowel ([place]) 12/10/2005  Tempoary Colostomy Bag, Skin Graft for Abd wound   Polysubstance abuse ([place])    a.) ETOH, tobacco, marijuana, methamphetamines, cocaine, BZO, opioids.   Subarachnoid hemorrhage ([place]) 01/26/2010   a.) 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   T2DM (type 2 diabetes mellitus) ([place])    Tobacco abuse    Type 2 diabetes mellitus without complication, without long-term current use of insulin ([place]) 04/17/2016   Vitreous hemorrhage ([place]) 03/27/2011   Overview:  Bilateral; 01/2010, from [facility]    Past Surgical History:   Procedure Laterality Date   A/V FISTULAGRAM Left 07/16/2022   Procedure: A/V Fistulagram;  Surgeon: Algernon Huxley, MD;  Location: [place] [facility];  Service: Cardiovascular;  Laterality: Left;   AV FISTULA PLACEMENT Left 05/24/2022   Procedure: ARTERIOVENOUS (AV) FISTULA CREATION ( RADIAL CEPHALIC);  Surgeon: Algernon Huxley, MD;  Location: [facility];  Service: Vascular;  Laterality: Left;   CEREBRAL ANEURYSM REPAIR Right 01/26/2010   Procedure: CEREBRAL ANEURYSM REPAIR (COIL EMBOLIZATION)   CEREBRAL ANEURYSM REPAIR Right 05/14/2019   Procedure: CRAINOTOMY FOR CEREBRAL ANEURYSM REPAIR (CLIP LIGATION)   COLON SURGERY  12/10/2005   colostomy bag placed s/p perforated bowel   COLONOSCOPY WITH PROPOFOL N/A 05/18/2020   Procedure: COLONOSCOPY WITH PROPOFOL;  Surgeon: Lin Landsman, MD;  Location: [place];  Service: Gastroenterology;  Laterality: N/A;   DIALYSIS/PERMA CATHETER INSERTION N/A 06/27/2022   Procedure: DIALYSIS/PERMA CATHETER INSERTION;  Surgeon: Katha Cabal, MD;  Location: [place] [facility];  Service: Cardiovascular;  Laterality: N/A;   KNEE SURGERY Right    Perforated bowel     Social History:  reports that he has been smoking cigarettes. He started smoking about 37 years ago. He has been smoking an average of .5 packs per day. He has never used smokeless tobacco. He reports current alcohol use. He reports that he does not currently use drugs after having used the following drugs: "Crack" cocaine, Amphetamines, Methamphetamines, Benzodiazepines, Cocaine, Marijuana, and Other-see comments.  Allergies  Allergen Reactions   Atorvastatin     Joint Aches - Severe Joint Aches - Severe Joint Aches - Severe   Avelox [Moxifloxacin Hcl In Nacl]     Muscle pain   Buprenorphine     Mouth sores, confusion, shaking   Dilaudid [Hydromorphone Hcl] Hives   Fluoxetine Itching   Levofloxacin Other (See Comments)    Joint Pain   Morphine And Related    Other     Muscle  pain   Suboxone [Buprenorphine Hcl-Naloxone Hcl] Other (See Comments)    Rash and confused   Vancomycin     Renal insufficiency    Family History  Problem Relation Age of Onset   Arthritis Mother    Asthma Mother    Diabetes Mother    Heart disease Mother    Hyperlipidemia Mother    Hypertension Mother    Kidney disease Mother    Thyroid disease Mother    Lung disease Mother    Anxiety disorder Mother    Depression Mother    Diabetes Father    Heart disease Father    Depression Father    Anxiety disorder Father    Arthritis Sister    Asthma Sister    Hyperlipidemia Sister    Hypertension Sister    Lung disease Sister    Anxiety disorder Sister    Depression Sister    Hyperlipidemia Brother    Hypertension Brother  Diabetes Sister    Heart disease Sister    Depression Sister    Anxiety disorder Sister    Anxiety disorder Brother    Depression Brother    Heart disease Brother     Prior to Admission medications   Medication Sig Start Date End Date Taking? Authorizing Provider  albuterol (VENTOLIN HFA) 108 (90 Base) MCG/ACT inhaler Inhale 1-2 puffs into the lungs every 6 (six) hours as needed for wheezing or shortness of breath. 07/02/22   Leslye Peer, Richard, MD  amLODipine (NORVASC) 10 MG tablet TAKE 1 TABLET(10 MG) BY MOUTH DAILY 08/03/22   Jon Billings, NP  aspirin EC 81 MG tablet Take 1 tablet (81 mg total) by mouth daily. Swallow whole. 06/26/22   Sharen Hones, MD  budesonide-formoterol [facility]) 160-4.5 MCG/ACT inhaler Inhale 2 puffs into the lungs 2 (two) times daily. 07/02/22   Loletha Grayer, MD  calcitRIOL (ROCALTROL) 0.25 MCG capsule Take 1 capsule (0.25 mcg total) by mouth daily. 08/03/22   Jon Billings, NP  carvedilol (COREG) 25 MG tablet TAKE 1 TABLET(25 MG) BY MOUTH TWICE DAILY WITH A MEAL 08/03/22   Jon Billings, NP  DULoxetine (CYMBALTA) 60 MG capsule Take 1 capsule (60 mg total) by mouth daily. 08/03/22 09/02/22  Jon Billings, NP   Evolocumab (REPATHA SURECLICK) 149 MG/ML SOAJ Inject 140 mg into the skin every 14 (fourteen) days. 07/19/22   Furth, Cadence H, PA-C  ezetimibe (ZETIA) 10 MG tablet Take 1 tablet (10 mg total) by mouth daily. 06/26/22   Sharen Hones, MD  glucose blood [facility] ULTRA) test strip USE TO TEST BLOOD SUGAR DAILY 08/23/20   Noemi Chapel A, NP  hydrALAZINE (APRESOLINE) 10 MG tablet TAKE 1 TABLET(10 MG) BY MOUTH THREE TIMES DAILY 08/03/22   Jon Billings, NP  hydrOXYzine (ATARAX) 50 MG tablet TAKE ONE TABLET 3 TIMES DAILY AS NEEDED 08/03/22   Jon Billings, NP  multivitamin (RENA-VIT) TABS tablet Take 1 tablet by mouth daily. Patient not taking: Reported on 08/28/2022 07/20/22   [provider]  OXYGEN Inhale 3 L into the lungs daily.    [provider]  pantoprazole (PROTONIX) 40 MG tablet TAKE 1 TABLET(40 MG) BY MOUTH TWICE DAILY AS NEEDED 08/03/22   Jon Billings, NP  patiromer [facility]) 8.4 g packet Take 1 packet (8.4 g total) by mouth every Monday, Wednesday, and Friday. Patient not taking: Reported on 08/28/2022 06/25/22   Sharen Hones, MD  pregabalin (LYRICA) 50 MG capsule Take 1 capsule (50 mg total) by mouth daily. 08/03/22   Jon Billings, NP  silver sulfADIAZINE (SILVADENE) 1 % cream Apply 1 Application topically daily. 07/05/22   Kris Hartmann, NP  topiramate (TOPAMAX) 50 MG tablet TAKE 1 TABLET(50 MG) BY MOUTH TWICE DAILY 08/03/22   Jon Billings, NP  torsemide (DEMADEX) 20 MG tablet TAKE 2 TABLETS(40 MG) BY MOUTH DAILY 08/03/22   Jon Billings, NP  traZODone (DESYREL) 100 MG tablet Take 1 tablet (100 mg total) by mouth at bedtime. 07/02/22   Loletha Grayer, MD    Physical Exam: Vitals:   09/06/22 2345 09/07/22 0045 09/07/22 0230 09/07/22 0300  BP: (!) 150/124 (!) 155/95 (!) 135/97 (!) 172/102  Pulse: 72 84 70 79  Resp: 19 20 18  (!) 22  Temp: 98.8 F (37.1 C) 98.8 F (37.1 C)    TempSrc: Oral Oral    SpO2: 94% (!) 87% 100% 92%  Weight:       Height:       Physical Exam Vitals and nursing  note reviewed.  Constitutional:      General: He is awake. He is not in acute distress.    Appearance: He is morbidly obese.     Interventions: Nasal cannula in place.     Comments: Confused and restless  HENT:     Head: Normocephalic and atraumatic.  Cardiovascular:     Rate and Rhythm: Normal rate and regular rhythm.     Heart sounds: Normal heart sounds.  Pulmonary:     Effort: Pulmonary effort is normal.     Breath sounds: Normal breath sounds.  Abdominal:     Palpations: Abdomen is soft.     Tenderness: There is no abdominal tenderness.  Neurological:     General: No focal deficit present.     Mental Status: He is disoriented.     Labs on Admission: I have personally reviewed following labs and imaging studies  CBC: Recent Labs  Lab 09/06/22 1615  WBC 10.0  NEUTROABS 7.2  HGB 13.1  HCT 40.0  MCV 90.9  PLT 283   Basic Metabolic Panel: Recent Labs  Lab 09/06/22 1615  NA 140  K 3.7  CL 95*  CO2 29  GLUCOSE 103*  BUN 34*  CREATININE 4.75*  CALCIUM 9.3   GFR: Estimated Creatinine Clearance: 20.1 mL/min (A) (by C-G formula based on SCr of 4.75 mg/dL (H)). Liver Function Tests: Recent Labs  Lab 09/06/22 1615  AST 26  ALT 16  ALKPHOS 92  BILITOT 1.5*  PROT 9.1*  ALBUMIN 5.0   No results for input(s): "LIPASE", "AMYLASE" in the last 168 hours. Recent Labs  Lab 09/06/22 2342  AMMONIA 29   Coagulation Profile: No results for input(s): "INR", "PROTIME" in the last 168 hours. Cardiac Enzymes: No results for input(s): "CKTOTAL", "CKMB", "CKMBINDEX", "TROPONINI" in the last 168 hours. BNP (last 3 results) No results for input(s): "PROBNP" in the last 8760 hours. HbA1C: No results for input(s): "HGBA1C" in the last 72 hours. CBG: No results for input(s): "GLUCAP" in the last 168 hours. Lipid Profile: No results for input(s): "CHOL", "HDL", "LDLCALC", "TRIG", "CHOLHDL", "LDLDIRECT" in the last 72  hours. Thyroid Function Tests: No results for input(s): "TSH", "T4TOTAL", "FREET4", "T3FREE", "THYROIDAB" in the last 72 hours. Anemia Panel: No results for input(s): "VITAMINB12", "FOLATE", "FERRITIN", "TIBC", "IRON", "RETICCTPCT" in the last 72 hours. Urine analysis:    Component Value Date/Time   COLORURINE YELLOW (A) 09/06/2022 2342   APPEARANCEUR HAZY (A) 09/06/2022 2342   APPEARANCEUR Clear 04/17/2021 1544   LABSPEC 1.010 09/06/2022 2342   LABSPEC 1.029 06/26/2012 1231   PHURINE 5.0 09/06/2022 2342   GLUCOSEU NEGATIVE 09/06/2022 2342   GLUCOSEU Negative 06/26/2012 1231   HGBUR NEGATIVE 09/06/2022 2342   BILIRUBINUR NEGATIVE 09/06/2022 2342   BILIRUBINUR Negative 04/17/2021 1544   BILIRUBINUR Negative 06/26/2012 [place] 09/06/2022 2342   PROTEINUR 100 (A) 09/06/2022 2342   NITRITE NEGATIVE 09/06/2022 2342   LEUKOCYTESUR NEGATIVE 09/06/2022 2342   LEUKOCYTESUR Negative 06/26/2012 1231    Radiological Exams on Admission: DG Knee Complete 4 Views Right  Result Date: 09/06/2022 CLINICAL DATA:  Right knee pain after fall EXAM: RIGHT KNEE - COMPLETE 4+ VIEW COMPARISON:  Radiographs 08/07/2019 FINDINGS: No acute fracture or dislocation. Advanced hypertrophic degenerative arthritis right knee with advanced tricompartmental joint space narrowing. Bone staples intact and unchanged in the distal femur and proximal tibia. Small knee joint effusion. IMPRESSION: No acute fracture or dislocation. Advanced tricompartmental degenerative arthritis. Electronically Signed   By: Dorothea Ogle  Stutzman M.D.   On: 09/06/2022 20:03   DG Shoulder Left  Result Date: 09/06/2022 CLINICAL DATA:  Fall EXAM: LEFT SHOULDER - 2+ VIEW COMPARISON:  None Available. FINDINGS: Degenerative changes in the glenohumeral joint with spurring. No acute bony abnormality. Specifically, no fracture, subluxation, or dislocation. IMPRESSION: No acute bony abnormality. Electronically Signed   By: Rolm Baptise M.D.    On: 09/06/2022 17:10   DG Chest 2 View  Result Date: 09/06/2022 CLINICAL DATA:  Fall EXAM: CHEST - 2 VIEW COMPARISON:  06/30/2022 FINDINGS: Right dialysis catheter remains in place, unchanged. Heart and mediastinal contours are within normal limits. No focal opacities or effusions. No acute bony abnormality. IMPRESSION: No active cardiopulmonary disease. Electronically Signed   By: Rolm Baptise M.D.   On: 09/06/2022 17:09   CT Head Wo Contrast  Result Date: 09/06/2022 CLINICAL DATA:  Fall EXAM: CT HEAD WITHOUT CONTRAST CT CERVICAL SPINE WITHOUT CONTRAST TECHNIQUE: Multidetector CT imaging of the head and cervical spine was performed following the standard protocol without intravenous contrast. Multiplanar CT image reconstructions of the cervical spine were also generated. RADIATION DOSE REDUCTION: This exam was performed according to the departmental dose-optimization program which includes automated exposure control, adjustment of the mA and/or kV according to patient size and/or use of iterative reconstruction technique. COMPARISON:  CT head 06/23/2022, CT cervical spine 03/24/2019 FINDINGS: CT HEAD FINDINGS Brain: There is no acute intracranial hemorrhage, extra-axial fluid collection, or acute infarct. Parenchymal volume is normal. The ventricles are normal in size. There is encephalomalacia in the left anterior temporal lobe, unchanged. Small remote lacunar infarcts in the right thalamus, left lentiform nucleus, and right external capsule are noted. There is a small region of encephalomalacia in the left frontal lobe presumably related to a prior catheter given overlying skull defect. There is no solid mass lesion. There is dural thickening underlying the right-sided craniotomy, unchanged since the prior study. There is no mass effect or midline shift. 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 Vascular: No hyperdense vessel or unexpected calcification.  Skull: Postsurgical changes reflecting right frontotemporal craniotomy are noted. Sinuses/Orbits: Mucosal thickening and layering fluid is again seen in the bilateral maxillary sinuses with associated hyperostosis. The globes and orbits are unremarkable. Other: None CT CERVICAL SPINE FINDINGS Alignment: There is no antero or retrolisthesis. There is no jumped or perched facet or other evidence of traumatic malalignment. Skull base and vertebrae: Skull base alignment is maintained. Vertebral body heights are preserved. There is no evidence of acute fracture. There is no suspicious osseous lesion. Soft tissues and spinal canal: No prevertebral fluid or swelling. No visible canal hematoma. Disc levels: There is disc space narrowing and degenerative endplate change most advanced at C3-C4 and C5-C6, similar to the prior study. There is no evidence of high-grade spinal canal stenosis. Upper chest: The imaged lung apices are clear. Other: None. IMPRESSION: 1. Stable noncontrast head CT with no acute intracranial pathology. 2. No acute fracture or traumatic malalignment of the cervical spine. 3. Chronic maxillary sinusitis with layering fluid which again raises the possibility of acute sinusitis in the correct clinical setting. Electronically Signed   By: Valetta Mole M.D.   On: 09/06/2022 16:53   CT Cervical Spine Wo Contrast  Result Date: 09/06/2022 CLINICAL DATA:  Fall EXAM: CT HEAD WITHOUT CONTRAST CT CERVICAL SPINE WITHOUT CONTRAST TECHNIQUE: Multidetector CT imaging of the head and cervical spine was performed following the standard protocol without intravenous contrast. Multiplanar CT image reconstructions of  the cervical spine were also generated. RADIATION DOSE REDUCTION: This exam was performed according to the departmental dose-optimization program which includes automated exposure control, adjustment of the mA and/or kV according to patient size and/or use of iterative reconstruction technique. COMPARISON:   CT head 06/23/2022, CT cervical spine 03/24/2019 FINDINGS: CT HEAD FINDINGS Brain: There is no acute intracranial hemorrhage, extra-axial fluid collection, or acute infarct. Parenchymal volume is normal. The ventricles are normal in size. There is encephalomalacia in the left anterior temporal lobe, unchanged. Small remote lacunar infarcts in the right thalamus, left lentiform nucleus, and right external capsule are noted. There is a small region of encephalomalacia in the left frontal lobe presumably related to a prior catheter given overlying skull defect. There is no solid mass lesion. There is dural thickening underlying the right-sided craniotomy, unchanged since the prior study. There is no mass effect or midline shift. 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 Vascular: No hyperdense vessel or unexpected calcification. Skull: Postsurgical changes reflecting right frontotemporal craniotomy are noted. Sinuses/Orbits: Mucosal thickening and layering fluid is again seen in the bilateral maxillary sinuses with associated hyperostosis. The globes and orbits are unremarkable. Other: None CT CERVICAL SPINE FINDINGS Alignment: There is no antero or retrolisthesis. There is no jumped or perched facet or other evidence of traumatic malalignment. Skull base and vertebrae: Skull base alignment is maintained. Vertebral body heights are preserved. There is no evidence of acute fracture. There is no suspicious osseous lesion. Soft tissues and spinal canal: No prevertebral fluid or swelling. No visible canal hematoma. Disc levels: There is disc space narrowing and degenerative endplate change most advanced at C3-C4 and C5-C6, similar to the prior study. There is no evidence of high-grade spinal canal stenosis. Upper chest: The imaged lung apices are clear. Other: None. IMPRESSION: 1. Stable noncontrast head CT with no acute intracranial pathology. 2. No acute fracture or  traumatic malalignment of the cervical spine. 3. Chronic maxillary sinusitis with layering fluid which again raises the possibility of acute sinusitis in the correct clinical setting. Electronically Signed   By: Valetta Mole M.D.   On: 09/06/2022 16:53     Data Reviewed: Relevant notes from primary care and specialist visits, past discharge summaries as available in EHR, including Care Everywhere. Prior diagnostic testing as pertinent to current admission diagnoses Updated medications and problem lists for reconciliation ED course, including vitals, labs, imaging, treatment and response to treatment Triage notes, nursing and pharmacy notes and ED provider's notes Notable results as noted in HPI   Assessment and Plan: * Acute on chronic respiratory failure with hypoxia and hypercapnia (HCC) Etiology uncertain ABG with PCO2 50, normal pH.  O2 sats in the 70s on home flow rate of oxygen We will get a D-dimer and if negative rules out PE, otherwise will get VQ scan  (Per CT tech, once less than 6 months from initiation of dialysis needs nephrology clearance prior to getting a contrasted study in the event of renal recovery ) Supplemental O2 to keep sats over 76%  Acute metabolic encephalopathy Suspecting substance abuse related CT head negative for acute injury UDS positive for amphetamines and cocaine and patient hypoxic and hypercapnic No stigmata of acute infection Supplemental oxygen Neurologic checks with fall and aspiration precautions   Fall at home, initial encounter Trauma imaging with no evidence of acute injury Fall precautions  Chronic obstructive pulmonary disease (COPD) ([place]) Not acutely exacerbated DuoNebs as needed.  Continue home inhalers  Type II  diabetes mellitus with renal manifestations (HCC) Sliding scale insulin coverage  Acute pain of right knee and left shoulder secondary to fall Nonnarcotic pain control given altered mental status  Polysubstance abuse  ([place]) UDS positive for cocaine and amphetamines For counseling on abstinence when more awake and alert  ESRD on hemodialysis [facility]) Nephrology consult for continuation of dialysis  Chronic heart failure with preserved ejection fraction (HFpEF) (HCC) Clinically euvolemic Continue carvedilol, hydralazine and torsemide    OSA (obstructive sleep apnea) cpap qhs    DVT prophylaxis: heparin  Consults: Nephrology  Advance Care Planning:   Code Status: Prior   Family Communication: none  Disposition Plan: Back to previous home environment  Severity of Illness: The appropriate patient status for this patient is OBSERVATION. Observation status is judged to be reasonable and necessary in order to provide the required intensity of service to ensure the patient's safety. The patient's presenting symptoms, physical exam findings, and initial radiographic and laboratory data in the context of their medical condition is felt to place them at decreased risk for further clinical deterioration. Furthermore, it is anticipated that the patient will be medically stable for discharge from the hospital within 2 midnights of admission.   Author: Athena Masse, MD 09/07/2022 3:30 AM  For on call review [email or web address].

## 2022-09-07 NOTE — Assessment & Plan Note (Signed)
Nephrology consult for continuation of dialysis 

## 2022-09-07 NOTE — Care Plan (Addendum)
This 56 years old male with PMH significant for ESRD started on hemodialysis in July 2023, COPD on home oxygen at 3 L at baseline, DM, hypertension, OSA on CPAP, anemia of chronic kidney disease, depression with anxiety, tobacco use disorder, polysubstance abuse, HFp EF presented in the ED following an accidental fall in which she tripped on the sidewalk ,twisting his right leg and hitting his left shoulder.  Patient denies head injury or loss of consciousness. He was confused after he arrived in the ED and was found to be hypoxic with increased oxygen requirement.  Patient became confused, desaturated to 71%, increased to 4 L of supplemental oxygen.  COVID-negative.  Patient was evaluated by trauma team all imaging negative.  CTA chest was ordered to rule out PE but was unable to be obtained without nephrology consultation.  Urine drug screen positive for cocaine and amphetamines. VQ scan completed low probability of PE.  Patient was seen and examined at bedside.  Nephrology is consulted for continuation of hemodialysis.  Patient is admitted for further evaluation.

## 2022-09-07 NOTE — ED Notes (Signed)
Nephrology contacted twice since last note with no answer. Attempting to contact again.

## 2022-09-07 NOTE — Assessment & Plan Note (Addendum)
Trauma imaging with no evidence of acute injury Fall precautions

## 2022-09-07 NOTE — ED Notes (Signed)
Continues to DC all monitoring devices, and O2.  Pt requires frequent monitoring and encouragement to take a deep breath in order to increase SaO2.

## 2022-09-07 NOTE — Progress Notes (Signed)
Hd tx of 2.5hrs completed. 54.6L total vol processed. No complications. Report given to Derek williams, rn. Total uf removed: 2021ml Post hd v/s: 99.0 130/89(103) 91 26 99%

## 2022-09-07 NOTE — Progress Notes (Signed)
   09/07/22 1814  Escalate  MEWS: Escalate Yellow: discuss with charge nurse/RN and consider discussing with provider and RRT  Notify: Charge Nurse/RN  Name of Charge Nurse/RN Notified Dennard Schaumann., RN.  Date Charge Nurse/RN Notified 09/07/22  Time Charge Nurse/RN Notified 1815  Notify: Provider  Provider Name/Title Shawna Clamp  Date Provider Notified 09/07/22  Time Provider Notified 1815  Method of Notification Page  Notification Reason Other (Comment) (yellow MEWS)  Provider response No new orders (To continue to monitor)  Date of Provider Response 09/07/22  Time of Provider Response 1820

## 2022-09-08 DIAGNOSIS — J9622 Acute and chronic respiratory failure with hypercapnia: Secondary | ICD-10-CM | POA: Diagnosis not present

## 2022-09-08 DIAGNOSIS — J9621 Acute and chronic respiratory failure with hypoxia: Secondary | ICD-10-CM | POA: Diagnosis not present

## 2022-09-08 LAB — BASIC METABOLIC PANEL
Anion gap: 13 (ref 5–15)
BUN: 47 mg/dL — ABNORMAL HIGH (ref 6–20)
CO2: 27 mmol/L (ref 22–32)
Calcium: 9.1 mg/dL (ref 8.9–10.3)
Chloride: 96 mmol/L — ABNORMAL LOW (ref 98–111)
Creatinine, Ser: 5.7 mg/dL — ABNORMAL HIGH (ref 0.61–1.24)
GFR, Estimated: 11 mL/min — ABNORMAL LOW (ref >60)
Glucose, Bld: 108 mg/dL — ABNORMAL HIGH (ref 70–99)
Potassium: 3.2 mmol/L — ABNORMAL LOW (ref 3.5–5.1)
Sodium: 136 mmol/L (ref 135–145)

## 2022-09-08 LAB — GLUCOSE, CAPILLARY
Glucose-Capillary: 108 mg/dL — ABNORMAL HIGH (ref 70–99)
Glucose-Capillary: 109 mg/dL — ABNORMAL HIGH (ref 70–99)
Glucose-Capillary: 172 mg/dL — ABNORMAL HIGH (ref 70–99)
Glucose-Capillary: 224 mg/dL — ABNORMAL HIGH (ref 70–99)
Glucose-Capillary: 69 mg/dL — ABNORMAL LOW (ref 70–99)
Glucose-Capillary: 79 mg/dL (ref 70–99)

## 2022-09-08 LAB — CBC
HCT: 41.8 % (ref 39.0–52.0)
Hemoglobin: 14.2 g/dL (ref 13.0–17.0)
MCH: 30 pg (ref 26.0–34.0)
MCHC: 34 g/dL (ref 30.0–36.0)
MCV: 88.2 fL (ref 80.0–100.0)
Platelets: 268 10*3/uL (ref 150–400)
RBC: 4.74 MIL/uL (ref 4.22–5.81)
RDW: 13.4 % (ref 11.5–15.5)
WBC: 8 10*3/uL (ref 4.0–10.5)
nRBC: 0 % (ref 0.0–0.2)

## 2022-09-08 LAB — HEPATITIS B SURFACE ANTIBODY, QUANTITATIVE: Hep B S AB Quant (Post): <3.1 m[IU]/mL — ABNORMAL LOW (ref >9.9)

## 2022-09-08 LAB — MAGNESIUM: Magnesium: 2.2 mg/dL (ref 1.7–2.4)

## 2022-09-08 LAB — PHOSPHORUS: Phosphorus: 5.7 mg/dL — ABNORMAL HIGH (ref 2.5–4.6)

## 2022-09-08 MED ORDER — ACETAMINOPHEN 325 MG PO TABS
ORAL_TABLET | ORAL | Status: AC
  Filled 2022-09-08: qty 2

## 2022-09-08 MED ORDER — HEPARIN SODIUM (PORCINE) 1000 UNIT/ML IJ SOLN
INTRAMUSCULAR | Status: AC
  Filled 2022-09-08: qty 10

## 2022-09-08 NOTE — Progress Notes (Addendum)
PROGRESS NOTE    Derek Cooley.  PJA:[id number] DOB: 28-Apr-1966 DOA: 09/06/2022 PCP: Jon Billings, NP   Brief Narrative:  This 56 years old male with PMH significant for ESRD started on hemodialysis in July 2023, COPD on home oxygen at 3 L at baseline, DM, hypertension, OSA on CPAP, anemia of chronic kidney disease, depression with anxiety, tobacco use disorder, polysubstance abuse, HFp EF presented in the ED following an accidental fall in which she tripped on the sidewalk ,twisting his right leg and hitting his left shoulder.  Patient denies head injury or loss of consciousness. He was confused after he arrived in the ED and was found to be hypoxic with increased oxygen requirement.  Patient became confused, desaturated to 71%, increased to 4 L of supplemental oxygen.  COVID-negative.  Patient was evaluated by trauma team all imaging negative.  CTA chest was ordered to rule out PE but was unable to be obtained without nephrology consultation.  Urine drug screen positive for cocaine and amphetamines. VQ scan completed low probability of PE.  Nephrology is consulted for continuation of hemodialysis.  Patient is admitted for further evaluation.  Assessment & Plan:   Principal Problem:   Acute on chronic respiratory failure with hypoxia and hypercapnia (HCC) Active Problems:   Acute metabolic encephalopathy   Fall at home, initial encounter   Chronic obstructive pulmonary disease (COPD) ([place])   Type II diabetes mellitus with renal manifestations (HCC)   OSA (obstructive sleep apnea)   Chronic heart failure with preserved ejection fraction (HFpEF) (HCC)   ESRD on hemodialysis (HCC)   Polysubstance abuse (HCC)   Acute pain of right knee and left shoulder secondary to fall   Acute on chronic respiratory failure (HCC)   Acute on chronic hypoxic and hypercapnic respiratory failure: Etiology unknown at the time of arrival. ABG showed PCO2 50, Normal pH.  O2 sats in the 70s on home flow  rate of oxygen. D-dimer elevated, VQ scan completed,  low probability for PE. Continue supplemental oxygen and wean as tolerated.   Weaned down to room air.   Acute metabolic encephalopathy: Suspected could be substance abuse related. CT head negative for any intracranial abnormality. UDS : Amphetamine+, cocaine+ No stigmata of acute infection Continue supplemental oxygen , now weaned down to room air. Patient back to baseline mental status.   Fall at home, initial encounter: Continue fall precautions. Trauma imaging completed no evidence of acute injury.   COPD: Not in any acute exacerbation. DuoNebs as needed.  Continue home inhalers   Type 2 diabetes with renal manifestation: Carb modified diet.  Obtain hemoglobin A1c Continue sliding scale insulin coverage   Acute pain of right knee and left shoulder secondary to fall Adequate pain control with nonnarcotics given altered mentation.   Polysubstance abuse: UDS positive for cocaine and amphetamines Counseled on substance abuse abstinence.   ESRD on hemodialysis: Nephrology consulted for continuation of hemodialysis.   Chronic diastolic heart failure: Clinically euvolemic Continue carvedilol, hydralazine and torsemide   Obstructive sleep apnea: Continue CPAP at bedtime  Obesity: Body mass index is 33.94 kg/m.  Diet and exercise discussed in detail.  DVT prophylaxis: Heparin Code Status: Full code Family Communication: No family at bedside Disposition Plan:  Status is: Inpatient Remains inpatient appropriate because: Admitted for acute metabolic encephalopathy and acute on chronic hypoxic and hypercapnic respiratory failure secondary to substance abuse.  Patient continued on hemodialysis patient is back to baseline mental status.   Anticipated discharge home in 1 to 2  days.  Consultants:  Nephrology  Procedures: Dialysis Antimicrobials: None  Subjective: Patient was seen and examined at hemodialysis  suite..  Overnight events noted. He reports feeling better, weaned down to room air.  Denies any chest pain.   Objective: Vitals:   09/08/22 1130 09/08/22 1200 09/08/22 1230 09/08/22 1244  BP: 132/89 (!) 138/100 (!) 121/90 (!) 136/97  Pulse: 79 78 83 84  Resp: 20  (!) 25 20  Temp:    97.8 F (36.6 C)  TempSrc:    Oral  SpO2: 94% 90% 90% 99%  Weight:    98.3 kg  Height:        Intake/Output Summary (Last 24 hours) at 09/08/2022 1402 Last data filed at 09/08/2022 1244 Gross per 24 hour  Intake 240 ml  Output 4000 ml  Net -3760 ml   Filed Weights   09/06/22 1610 09/08/22 0901 09/08/22 1244  Weight: 105.7 kg 100.6 kg 98.3 kg    Examination:  General exam: Appears comfortable, not in any acute distress, deconditioned Respiratory system: CTA bilaterally, no wheezing, no crackles, normal respiratory effort. Cardiovascular system: S1 & S2 heard, regular rate and rhythm, no murmur. Gastrointestinal system: Abdomen is soft, non tender, non distended, BS+ Central nervous system: Alert and oriented x 3. No focal neurological deficits. Extremities: No edema, no cyanosis, no clubbing. Skin: No rashes, lesions or ulcers Psychiatry: Judgement and insight appear normal. Mood & affect appropriate.     Data Reviewed: I have personally reviewed following labs and imaging studies  CBC: Recent Labs  Lab 09/06/22 1615 09/07/22 0403 09/08/22 0756  WBC 10.0 8.8 8.0  NEUTROABS 7.2  --   --   HGB 13.1 12.2* 14.2  HCT 40.0 36.8* 41.8  MCV 90.9 90.9 88.2  PLT 262 231 644   Basic Metabolic Panel: Recent Labs  Lab 09/06/22 1615 09/07/22 0403 09/08/22 0756  NA 140  --  136  K 3.7  --  3.2*  CL 95*  --  96*  CO2 29  --  27  GLUCOSE 103*  --  108*  BUN 34*  --  47*  CREATININE 4.75* 5.30* 5.70*  CALCIUM 9.3  --  9.1  MG  --   --  2.2  PHOS  --   --  5.7*   GFR: Estimated Creatinine Clearance: 16.2 mL/min (A) (by C-G formula based on SCr of 5.7 mg/dL (H)). Liver Function  Tests: Recent Labs  Lab 09/06/22 1615  AST 26  ALT 16  ALKPHOS 92  BILITOT 1.5*  PROT 9.1*  ALBUMIN 5.0   No results for input(s): "LIPASE", "AMYLASE" in the last 168 hours. Recent Labs  Lab 09/06/22 2342  AMMONIA 29   Coagulation Profile: No results for input(s): "INR", "PROTIME" in the last 168 hours. Cardiac Enzymes: No results for input(s): "CKTOTAL", "CKMB", "CKMBINDEX", "TROPONINI" in the last 168 hours. BNP (last 3 results) No results for input(s): "PROBNP" in the last 8760 hours. HbA1C: No results for input(s): "HGBA1C" in the last 72 hours. CBG: Recent Labs  Lab 09/07/22 1724 09/07/22 1906 09/08/22 0001 09/08/22 0255 09/08/22 0753  GLUCAP 85 77 108* 79 109*   Lipid Profile: No results for input(s): "CHOL", "HDL", "LDLCALC", "TRIG", "CHOLHDL", "LDLDIRECT" in the last 72 hours. Thyroid Function Tests: No results for input(s): "TSH", "T4TOTAL", "FREET4", "T3FREE", "THYROIDAB" in the last 72 hours. Anemia Panel: No results for input(s): "VITAMINB12", "FOLATE", "FERRITIN", "TIBC", "IRON", "RETICCTPCT" in the last 72 hours. Sepsis Labs: No results for input(s): "PROCALCITON", "LATICACIDVEN"  in the last 168 hours.  Recent Results (from the past 240 hour(s))  SARS Coronavirus 2 by RT PCR (hospital order, performed in [facility] hospital lab) *cepheid single result test* Anterior Nasal Swab     Status: None   Collection Time: 09/06/22 10:19 PM   Specimen: Anterior Nasal Swab  Result Value Ref Range Status   SARS Coronavirus 2 by RT PCR NEGATIVE NEGATIVE Final    Comment: (NOTE) SARS-CoV-2 target nucleic acids are NOT DETECTED.  The SARS-CoV-2 RNA is generally detectable in upper and lower respiratory specimens during the acute phase of infection. The lowest concentration of SARS-CoV-2 viral copies this assay can detect is 250 copies / mL. A negative result does not preclude SARS-CoV-2 infection and should not be used as the sole basis for treatment or  other patient management decisions.  A negative result may occur with improper specimen collection / handling, submission of specimen other than nasopharyngeal swab, presence of viral mutation(s) within the areas targeted by this assay, and inadequate number of viral copies (<250 copies / mL). A negative result must be combined with clinical observations, patient history, and epidemiological information.  Fact Sheet for Patients:   https://www.patel.info/  Fact Sheet for Healthcare Providers: https://hall.com/  This test is not yet approved or  cleared by the [place] FDA and has been authorized for detection and/or diagnosis of SARS-CoV-2 by FDA under an Emergency Use Authorization (EUA).  This EUA will remain in effect (meaning this test can be used) for the duration of the COVID-19 declaration under Section 564(b)(1) of the Act, 21 U.S.C. section 360bbb-3(b)(1), unless the authorization is terminated or revoked sooner.  Performed at [facility], [address], [place], [place] [address]     Radiology Studies: NM Pulmonary Perfusion  Result Date: 09/07/2022 CLINICAL DATA:  History of asthma and emphysema. EXAM: NUCLEAR MEDICINE PERFUSION LUNG SCAN TECHNIQUE: Perfusion images were obtained in multiple projections after intravenous injection of radiopharmaceutical. Ventilation scans intentionally deferred if perfusion scan and chest x-ray adequate for interpretation during COVID 19 epidemic. RADIOPHARMACEUTICALS:  4.13 mCi Tc-34m MAA IV COMPARISON:  Chest radiograph dated September 06, 2022 FINDINGS: No segmental perfusion defect to suggest pulmonary embolism. IMPRESSION: Normal or low probability of pulmonary embolism. Electronically Signed   By: Keane Police D.O.   On: 09/07/2022 12:27   DG Knee Complete 4 Views Right  Result Date: 09/06/2022 CLINICAL DATA:  Right knee pain after fall EXAM: RIGHT KNEE - COMPLETE 4+ VIEW  COMPARISON:  Radiographs 08/07/2019 FINDINGS: No acute fracture or dislocation. Advanced hypertrophic degenerative arthritis right knee with advanced tricompartmental joint space narrowing. Bone staples intact and unchanged in the distal femur and proximal tibia. Small knee joint effusion. IMPRESSION: No acute fracture or dislocation. Advanced tricompartmental degenerative arthritis. Electronically Signed   By: Placido Sou M.D.   On: 09/06/2022 20:03   DG Shoulder Left  Result Date: 09/06/2022 CLINICAL DATA:  Fall EXAM: LEFT SHOULDER - 2+ VIEW COMPARISON:  None Available. FINDINGS: Degenerative changes in the glenohumeral joint with spurring. No acute bony abnormality. Specifically, no fracture, subluxation, or dislocation. IMPRESSION: No acute bony abnormality. Electronically Signed   By: Rolm Baptise M.D.   On: 09/06/2022 17:10   DG Chest 2 View  Result Date: 09/06/2022 CLINICAL DATA:  Fall EXAM: CHEST - 2 VIEW COMPARISON:  06/30/2022 FINDINGS: Right dialysis catheter remains in place, unchanged. Heart and mediastinal contours are within normal limits. No focal opacities or effusions. No acute bony abnormality. IMPRESSION: No active  cardiopulmonary disease. Electronically Signed   By: Rolm Baptise M.D.   On: 09/06/2022 17:09   CT Head Wo Contrast  Result Date: 09/06/2022 CLINICAL DATA:  Fall EXAM: CT HEAD WITHOUT CONTRAST CT CERVICAL SPINE WITHOUT CONTRAST TECHNIQUE: Multidetector CT imaging of the head and cervical spine was performed following the standard protocol without intravenous contrast. Multiplanar CT image reconstructions of the cervical spine were also generated. RADIATION DOSE REDUCTION: This exam was performed according to the departmental dose-optimization program which includes automated exposure control, adjustment of the mA and/or kV according to patient size and/or use of iterative reconstruction technique. COMPARISON:  CT head 06/23/2022, CT cervical spine 03/24/2019 FINDINGS:  CT HEAD FINDINGS Brain: There is no acute intracranial hemorrhage, extra-axial fluid collection, or acute infarct. Parenchymal volume is normal. The ventricles are normal in size. There is encephalomalacia in the left anterior temporal lobe, unchanged. Small remote lacunar infarcts in the right thalamus, left lentiform nucleus, and right external capsule are noted. There is a small region of encephalomalacia in the left frontal lobe presumably related to a prior catheter given overlying skull defect. There is no solid mass lesion. There is dural thickening underlying the right-sided craniotomy, unchanged since the prior study. There is no mass effect or midline shift. 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 Vascular: No hyperdense vessel or unexpected calcification. Skull: Postsurgical changes reflecting right frontotemporal craniotomy are noted. Sinuses/Orbits: Mucosal thickening and layering fluid is again seen in the bilateral maxillary sinuses with associated hyperostosis. The globes and orbits are unremarkable. Other: None CT CERVICAL SPINE FINDINGS Alignment: There is no antero or retrolisthesis. There is no jumped or perched facet or other evidence of traumatic malalignment. Skull base and vertebrae: Skull base alignment is maintained. Vertebral body heights are preserved. There is no evidence of acute fracture. There is no suspicious osseous lesion. Soft tissues and spinal canal: No prevertebral fluid or swelling. No visible canal hematoma. Disc levels: There is disc space narrowing and degenerative endplate change most advanced at C3-C4 and C5-C6, similar to the prior study. There is no evidence of high-grade spinal canal stenosis. Upper chest: The imaged lung apices are clear. Other: None. IMPRESSION: 1. Stable noncontrast head CT with no acute intracranial pathology. 2. No acute fracture or traumatic malalignment of the cervical spine. 3. Chronic  maxillary sinusitis with layering fluid which again raises the possibility of acute sinusitis in the correct clinical setting. Electronically Signed   By: Valetta Mole M.D.   On: 09/06/2022 16:53   CT Cervical Spine Wo Contrast  Result Date: 09/06/2022 CLINICAL DATA:  Fall EXAM: CT HEAD WITHOUT CONTRAST CT CERVICAL SPINE WITHOUT CONTRAST TECHNIQUE: Multidetector CT imaging of the head and cervical spine was performed following the standard protocol without intravenous contrast. Multiplanar CT image reconstructions of the cervical spine were also generated. RADIATION DOSE REDUCTION: This exam was performed according to the departmental dose-optimization program which includes automated exposure control, adjustment of the mA and/or kV according to patient size and/or use of iterative reconstruction technique. COMPARISON:  CT head 06/23/2022, CT cervical spine 03/24/2019 FINDINGS: CT HEAD FINDINGS Brain: There is no acute intracranial hemorrhage, extra-axial fluid collection, or acute infarct. Parenchymal volume is normal. The ventricles are normal in size. There is encephalomalacia in the left anterior temporal lobe, unchanged. Small remote lacunar infarcts in the right thalamus, left lentiform nucleus, and right external capsule are noted. There is a small region of encephalomalacia in the left frontal lobe presumably related to  a prior catheter given overlying skull defect. There is no solid mass lesion. There is dural thickening underlying the right-sided craniotomy, unchanged since the prior study. There is no mass effect or midline shift. 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 Vascular: No hyperdense vessel or unexpected calcification. Skull: Postsurgical changes reflecting right frontotemporal craniotomy are noted. Sinuses/Orbits: Mucosal thickening and layering fluid is again seen in the bilateral maxillary sinuses with associated hyperostosis. The globes  and orbits are unremarkable. Other: None CT CERVICAL SPINE FINDINGS Alignment: There is no antero or retrolisthesis. There is no jumped or perched facet or other evidence of traumatic malalignment. Skull base and vertebrae: Skull base alignment is maintained. Vertebral body heights are preserved. There is no evidence of acute fracture. There is no suspicious osseous lesion. Soft tissues and spinal canal: No prevertebral fluid or swelling. No visible canal hematoma. Disc levels: There is disc space narrowing and degenerative endplate change most advanced at C3-C4 and C5-C6, similar to the prior study. There is no evidence of high-grade spinal canal stenosis. Upper chest: The imaged lung apices are clear. Other: None. IMPRESSION: 1. Stable noncontrast head CT with no acute intracranial pathology. 2. No acute fracture or traumatic malalignment of the cervical spine. 3. Chronic maxillary sinusitis with layering fluid which again raises the possibility of acute sinusitis in the correct clinical setting. Electronically Signed   By: Valetta Mole M.D.   On: 09/06/2022 16:53    Scheduled Meds:  Chlorhexidine Gluconate Cloth  6 each Topical Q0600   heparin  5,000 Units Subcutaneous Q8H   insulin aspart  0-20 Units Subcutaneous Q4H   nicotine  21 mg Transdermal Daily   Continuous Infusions:  anticoagulant sodium citrate       LOS: 1 day    Time spent: 50 mins    Parilee Hally, MD [facility]   If 7PM-7AM, please contact night-coverage

## 2022-09-08 NOTE — Progress Notes (Signed)
Patient is alert but only oriented to person and place. Attempted to leave several times to go outside to smoke. Denied pain. Patient pulled tele monitor off several times-had to place in standby until patient calmed down. Was agitated about [place] taking his accuchecks because he stated that he did not have high blood sugar. Blood sugar has been 77/108/76 for the night. Denied additional needs. Will continue care plan.

## 2022-09-08 NOTE — Progress Notes (Signed)
Gave patient's CHG bath and morning meds. Placed tele back onto patient. Patient's mentation has improved. He is following commands and speaking rationally. Spoke with him briefly about his home life. Contacted MD Damita Dunnings to see if patient can get a diet put in since he was stating that he was hungry. Day shift reported that patient was NPO due to confusion. He is now alert and oriented x3.

## 2022-09-08 NOTE — Progress Notes (Signed)
[facility]  ROUNDING NOTE   Subjective:   Derek Binet. is a 56 year old male with past medical conditions including COPD on home oxygen at 3 L, diabetes, struct of sleep apnea, anemia, hypertension, tobacco use, polysubstance abuse, CHF, end-stage renal disease on hemodialysis.  Patient presents to the emergency room after suffering a fall and complaining of right leg and left shoulder pain.  Patient has been admitted for Confusion [R41.0] Musculoskeletal pain [M79.18] Fall, initial encounter [W19.XXXA] Acute on chronic respiratory failure with hypoxia (HCC) [J96.21] Acute on chronic respiratory failure ([place]) [J96.20]  Patient is known to our practice and receives outpatient dialysis treatments at [facility] on a TTS schedule, supervised by Dr. Holley Raring. Patient presented to the emergency department with confusion and shortness of breath after a fall. We have been consulted to manage dialysis needs during this admission.   Patient seen during dialysis treatment today.  His is following basic commands.     Objective:  Vital signs in last 24 hours:  Temp:  [96.3 F (35.7 C)-99 F (37.2 C)] 98 F (36.7 C) (09/30 0901) Pulse Rate:  [74-128] 79 (09/30 1130) Resp:  [15-26] 20 (09/30 1130) BP: (109-166)/(72-145) 132/89 (09/30 1130) SpO2:  [92 %-100 %] 94 % (09/30 1130)  Weight change:  Filed Weights   09/06/22 1610  Weight: 105.7 kg    Intake/Output: I/O last 3 completed shifts: In: 240 [P.O.:240] Out: 2300 [Urine:300; Other:2000]   Intake/Output this shift:  No intake/output data recorded.  Physical Exam: General: NAD. Following commands.   Head: Normocephalic, atraumatic. Moist oral mucosal membranes  Eyes: Anicteric  Lungs:  Clear to auscultation, normal effort  Heart: Regular rate and rhythm  Abdomen:  Soft, nontender  Extremities: No peripheral edema.  Neurologic: Nonfocal, moving all four extremities  Skin: No lesions  Access: Right chest  PermCath, left lower aVF (maturing)    Basic Metabolic Panel: Recent Labs  Lab 09/06/22 1615 09/07/22 0403 09/08/22 0756  NA 140  --  136  K 3.7  --  3.2*  CL 95*  --  96*  CO2 29  --  27  GLUCOSE 103*  --  108*  BUN 34*  --  47*  CREATININE 4.75* 5.30* 5.70*  CALCIUM 9.3  --  9.1  MG  --   --  2.2  PHOS  --   --  5.7*     Liver Function Tests: Recent Labs  Lab 09/06/22 1615  AST 26  ALT 16  ALKPHOS 92  BILITOT 1.5*  PROT 9.1*  ALBUMIN 5.0    No results for input(s): "LIPASE", "AMYLASE" in the last 168 hours. Recent Labs  Lab 09/06/22 2342  AMMONIA 29     CBC: Recent Labs  Lab 09/06/22 1615 09/07/22 0403 09/08/22 0756  WBC 10.0 8.8 8.0  NEUTROABS 7.2  --   --   HGB 13.1 12.2* 14.2  HCT 40.0 36.8* 41.8  MCV 90.9 90.9 88.2  PLT 262 231 268     Cardiac Enzymes: No results for input(s): "CKTOTAL", "CKMB", "CKMBINDEX", "TROPONINI" in the last 168 hours.  BNP: Invalid input(s): "POCBNP"  CBG: Recent Labs  Lab 09/07/22 1724 09/07/22 1906 09/08/22 0001 09/08/22 0255 09/08/22 0753  GLUCAP 85 77 108* 26 109*     Microbiology: Results for orders placed or performed during the hospital encounter of 09/06/22  SARS Coronavirus 2 by RT PCR (hospital order, performed in [facility] hospital lab) *cepheid single result test* Anterior Nasal Swab  Status: None   Collection Time: 09/06/22 10:19 PM   Specimen: Anterior Nasal Swab  Result Value Ref Range Status   SARS Coronavirus 2 by RT PCR NEGATIVE NEGATIVE Final    Comment: (NOTE) SARS-CoV-2 target nucleic acids are NOT DETECTED.  The SARS-CoV-2 RNA is generally detectable in upper and lower respiratory specimens during the acute phase of infection. The lowest concentration of SARS-CoV-2 viral copies this assay can detect is 250 copies / mL. A negative result does not preclude SARS-CoV-2 infection and should not be used as the sole basis for treatment or other patient management decisions.  A  negative result may occur with improper specimen collection / handling, submission of specimen other than nasopharyngeal swab, presence of viral mutation(s) within the areas targeted by this assay, and inadequate number of viral copies (<250 copies / mL). A negative result must be combined with clinical observations, patient history, and epidemiological information.  Fact Sheet for Patients:   https://www.patel.info/  Fact Sheet for Healthcare Providers: https://hall.com/  This test is not yet approved or  cleared by the [place] FDA and has been authorized for detection and/or diagnosis of SARS-CoV-2 by FDA under an Emergency Use Authorization (EUA).  This EUA will remain in effect (meaning this test can be used) for the duration of the COVID-19 declaration under Section 564(b)(1) of the Act, 21 U.S.C. section 360bbb-3(b)(1), unless the authorization is terminated or revoked sooner.  Performed at [facility], [address] [place], [place] [address]     Coagulation Studies: No results for input(s): "LABPROT", "INR" in the last 72 hours.  Urinalysis: Recent Labs    09/06/22 2342  COLORURINE YELLOW*  LABSPEC 1.010  PHURINE 5.0  GLUCOSEU NEGATIVE  HGBUR NEGATIVE  BILIRUBINUR NEGATIVE  KETONESUR NEGATIVE  PROTEINUR 100*  NITRITE NEGATIVE  LEUKOCYTESUR NEGATIVE       Imaging: NM Pulmonary Perfusion  Result Date: 09/07/2022 CLINICAL DATA:  History of asthma and emphysema. EXAM: NUCLEAR MEDICINE PERFUSION LUNG SCAN TECHNIQUE: Perfusion images were obtained in multiple projections after intravenous injection of radiopharmaceutical. Ventilation scans intentionally deferred if perfusion scan and chest x-ray adequate for interpretation during COVID 19 epidemic. RADIOPHARMACEUTICALS:  4.13 mCi Tc-74m MAA IV COMPARISON:  Chest radiograph dated September 06, 2022 FINDINGS: No segmental perfusion defect to suggest  pulmonary embolism. IMPRESSION: Normal or low probability of pulmonary embolism. Electronically Signed   By: Keane Police D.O.   On: 09/07/2022 12:27   DG Knee Complete 4 Views Right  Result Date: 09/06/2022 CLINICAL DATA:  Right knee pain after fall EXAM: RIGHT KNEE - COMPLETE 4+ VIEW COMPARISON:  Radiographs 08/07/2019 FINDINGS: No acute fracture or dislocation. Advanced hypertrophic degenerative arthritis right knee with advanced tricompartmental joint space narrowing. Bone staples intact and unchanged in the distal femur and proximal tibia. Small knee joint effusion. IMPRESSION: No acute fracture or dislocation. Advanced tricompartmental degenerative arthritis. Electronically Signed   By: Placido Sou M.D.   On: 09/06/2022 20:03   DG Shoulder Left  Result Date: 09/06/2022 CLINICAL DATA:  Fall EXAM: LEFT SHOULDER - 2+ VIEW COMPARISON:  None Available. FINDINGS: Degenerative changes in the glenohumeral joint with spurring. No acute bony abnormality. Specifically, no fracture, subluxation, or dislocation. IMPRESSION: No acute bony abnormality. Electronically Signed   By: Rolm Baptise M.D.   On: 09/06/2022 17:10   DG Chest 2 View  Result Date: 09/06/2022 CLINICAL DATA:  Fall EXAM: CHEST - 2 VIEW COMPARISON:  06/30/2022 FINDINGS: Right dialysis catheter remains in place, unchanged. Heart and  mediastinal contours are within normal limits. No focal opacities or effusions. No acute bony abnormality. IMPRESSION: No active cardiopulmonary disease. Electronically Signed   By: Rolm Baptise M.D.   On: 09/06/2022 17:09   CT Head Wo Contrast  Result Date: 09/06/2022 CLINICAL DATA:  Fall EXAM: CT HEAD WITHOUT CONTRAST CT CERVICAL SPINE WITHOUT CONTRAST TECHNIQUE: Multidetector CT imaging of the head and cervical spine was performed following the standard protocol without intravenous contrast. Multiplanar CT image reconstructions of the cervical spine were also generated. RADIATION DOSE REDUCTION: This exam  was performed according to the departmental dose-optimization program which includes automated exposure control, adjustment of the mA and/or kV according to patient size and/or use of iterative reconstruction technique. COMPARISON:  CT head 06/23/2022, CT cervical spine 03/24/2019 FINDINGS: CT HEAD FINDINGS Brain: There is no acute intracranial hemorrhage, extra-axial fluid collection, or acute infarct. Parenchymal volume is normal. The ventricles are normal in size. There is encephalomalacia in the left anterior temporal lobe, unchanged. Small remote lacunar infarcts in the right thalamus, left lentiform nucleus, and right external capsule are noted. There is a small region of encephalomalacia in the left frontal lobe presumably related to a prior catheter given overlying skull defect. There is no solid mass lesion. There is dural thickening underlying the right-sided craniotomy, unchanged since the prior study. There is no mass effect or midline shift. 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 Vascular: No hyperdense vessel or unexpected calcification. Skull: Postsurgical changes reflecting right frontotemporal craniotomy are noted. Sinuses/Orbits: Mucosal thickening and layering fluid is again seen in the bilateral maxillary sinuses with associated hyperostosis. The globes and orbits are unremarkable. Other: None CT CERVICAL SPINE FINDINGS Alignment: There is no antero or retrolisthesis. There is no jumped or perched facet or other evidence of traumatic malalignment. Skull base and vertebrae: Skull base alignment is maintained. Vertebral body heights are preserved. There is no evidence of acute fracture. There is no suspicious osseous lesion. Soft tissues and spinal canal: No prevertebral fluid or swelling. No visible canal hematoma. Disc levels: There is disc space narrowing and degenerative endplate change most advanced at C3-C4 and C5-C6, similar to the prior  study. There is no evidence of high-grade spinal canal stenosis. Upper chest: The imaged lung apices are clear. Other: None. IMPRESSION: 1. Stable noncontrast head CT with no acute intracranial pathology. 2. No acute fracture or traumatic malalignment of the cervical spine. 3. Chronic maxillary sinusitis with layering fluid which again raises the possibility of acute sinusitis in the correct clinical setting. Electronically Signed   By: Valetta Mole M.D.   On: 09/06/2022 16:53   CT Cervical Spine Wo Contrast  Result Date: 09/06/2022 CLINICAL DATA:  Fall EXAM: CT HEAD WITHOUT CONTRAST CT CERVICAL SPINE WITHOUT CONTRAST TECHNIQUE: Multidetector CT imaging of the head and cervical spine was performed following the standard protocol without intravenous contrast. Multiplanar CT image reconstructions of the cervical spine were also generated. RADIATION DOSE REDUCTION: This exam was performed according to the departmental dose-optimization program which includes automated exposure control, adjustment of the mA and/or kV according to patient size and/or use of iterative reconstruction technique. COMPARISON:  CT head 06/23/2022, CT cervical spine 03/24/2019 FINDINGS: CT HEAD FINDINGS Brain: There is no acute intracranial hemorrhage, extra-axial fluid collection, or acute infarct. Parenchymal volume is normal. The ventricles are normal in size. There is encephalomalacia in the left anterior temporal lobe, unchanged. Small remote lacunar infarcts in the right thalamus, left lentiform nucleus, and right external  capsule are noted. There is a small region of encephalomalacia in the left frontal lobe presumably related to a prior catheter given overlying skull defect. There is no solid mass lesion. There is dural thickening underlying the right-sided craniotomy, unchanged since the prior study. There is no mass effect or midline shift. 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  Vascular: No hyperdense vessel or unexpected calcification. Skull: Postsurgical changes reflecting right frontotemporal craniotomy are noted. Sinuses/Orbits: Mucosal thickening and layering fluid is again seen in the bilateral maxillary sinuses with associated hyperostosis. The globes and orbits are unremarkable. Other: None CT CERVICAL SPINE FINDINGS Alignment: There is no antero or retrolisthesis. There is no jumped or perched facet or other evidence of traumatic malalignment. Skull base and vertebrae: Skull base alignment is maintained. Vertebral body heights are preserved. There is no evidence of acute fracture. There is no suspicious osseous lesion. Soft tissues and spinal canal: No prevertebral fluid or swelling. No visible canal hematoma. Disc levels: There is disc space narrowing and degenerative endplate change most advanced at C3-C4 and C5-C6, similar to the prior study. There is no evidence of high-grade spinal canal stenosis. Upper chest: The imaged lung apices are clear. Other: None. IMPRESSION: 1. Stable noncontrast head CT with no acute intracranial pathology. 2. No acute fracture or traumatic malalignment of the cervical spine. 3. Chronic maxillary sinusitis with layering fluid which again raises the possibility of acute sinusitis in the correct clinical setting. Electronically Signed   By: Valetta Mole M.D.   On: 09/06/2022 16:53     Medications:    anticoagulant sodium citrate      acetaminophen       Chlorhexidine Gluconate Cloth  6 each Topical Q0600   heparin  5,000 Units Subcutaneous Q8H   heparin sodium (porcine)       insulin aspart  0-20 Units Subcutaneous Q4H   nicotine  21 mg Transdermal Daily   acetaminophen, acetaminophen **OR** acetaminophen, albuterol, alteplase, anticoagulant sodium citrate, heparin, heparin sodium (porcine), lidocaine (PF), lidocaine-prilocaine, ondansetron **OR** ondansetron (ZOFRAN) IV, pentafluoroprop-tetrafluoroeth, technetium albumin  aggregated  Assessment/ Plan:  Mr. Priscilla Finklea. is a 56 y.o.  male with past medical conditions including COPD on home oxygen at 3 L, diabetes, struct of sleep apnea, anemia, hypertension, tobacco use, polysubstance abuse, CHF, end-stage renal disease on hemodialysis.  Patient presents to the emergency room after suffering a fall and complaining of right leg and left shoulder pain.  Patient has been admitted for Confusion [R41.0] Musculoskeletal pain [M79.18] Fall, initial encounter [W19.XXXA] Acute on chronic respiratory failure with hypoxia (HCC) [J96.21] Acute on chronic respiratory failure (HCC) [J96.20]  [facility] [place] [place]/TTS/Rt Permcath  End-stage renal disease on hemodialysis.  Will maintain outpatient schedule if possible.  S/P treatment yesterday.  Patient is receiving hemodialysis again today to maintain outpatient schedule.   2. Anemia of chronic kidney disease  Lab Results  Component Value Date   HGB 14.2 09/08/2022    Hemoglobin within desired target.  We will continue to monitor.  3. Secondary Hyperparathyroidism: with outpatient labs: PTH 130, phosphorus 4.6, calcium 8.8 on 06/18/22.   Lab Results  Component Value Date   PTH 135 (H) 12/07/2021   PTH Comment 12/07/2021   CALCIUM 9.1 09/08/2022   PHOS 5.7 (H) 09/08/2022  Calcium within acceptable target.  Phosphorus level 5.7.  Follow-up labs  4.  Hypertension with chronic kidney disease.  Home regimen includes amlodipine, carvedilol, hydralazine, and torsemide which are on hold  at present.  Will resume when appropriate.   LOS: Jefferson Makayah Pauli 9/30/202311:33 AM

## 2022-09-09 DIAGNOSIS — J9621 Acute and chronic respiratory failure with hypoxia: Secondary | ICD-10-CM | POA: Diagnosis not present

## 2022-09-09 DIAGNOSIS — J9622 Acute and chronic respiratory failure with hypercapnia: Secondary | ICD-10-CM | POA: Diagnosis not present

## 2022-09-09 LAB — BASIC METABOLIC PANEL
Anion gap: 13 (ref 5–15)
BUN: 49 mg/dL — ABNORMAL HIGH (ref 6–20)
CO2: 25 mmol/L (ref 22–32)
Calcium: 9.2 mg/dL (ref 8.9–10.3)
Chloride: 97 mmol/L — ABNORMAL LOW (ref 98–111)
Creatinine, Ser: 5.47 mg/dL — ABNORMAL HIGH (ref 0.61–1.24)
GFR, Estimated: 11 mL/min — ABNORMAL LOW (ref >60)
Glucose, Bld: 115 mg/dL — ABNORMAL HIGH (ref 70–99)
Potassium: 2.8 mmol/L — ABNORMAL LOW (ref 3.5–5.1)
Sodium: 135 mmol/L (ref 135–145)

## 2022-09-09 LAB — CBC
HCT: 41.1 % (ref 39.0–52.0)
Hemoglobin: 14.1 g/dL (ref 13.0–17.0)
MCH: 29.7 pg (ref 26.0–34.0)
MCHC: 34.3 g/dL (ref 30.0–36.0)
MCV: 86.7 fL (ref 80.0–100.0)
Platelets: 247 10*3/uL (ref 150–400)
RBC: 4.74 MIL/uL (ref 4.22–5.81)
RDW: 13.4 % (ref 11.5–15.5)
WBC: 8.4 10*3/uL (ref 4.0–10.5)
nRBC: 0 % (ref 0.0–0.2)

## 2022-09-09 LAB — MAGNESIUM: Magnesium: 2.1 mg/dL (ref 1.7–2.4)

## 2022-09-09 LAB — GLUCOSE, CAPILLARY
Glucose-Capillary: 108 mg/dL — ABNORMAL HIGH (ref 70–99)
Glucose-Capillary: 130 mg/dL — ABNORMAL HIGH (ref 70–99)
Glucose-Capillary: 137 mg/dL — ABNORMAL HIGH (ref 70–99)
Glucose-Capillary: 175 mg/dL — ABNORMAL HIGH (ref 70–99)

## 2022-09-09 LAB — PHOSPHORUS: Phosphorus: 6.4 mg/dL — ABNORMAL HIGH (ref 2.5–4.6)

## 2022-09-09 MED ORDER — POTASSIUM CHLORIDE 20 MEQ PO PACK
40.0000 meq | PACK | Freq: Once | ORAL | Status: AC
  Administered 2022-09-09: 40 meq via ORAL
  Filled 2022-09-09: qty 2

## 2022-09-09 MED ORDER — POTASSIUM CHLORIDE 10 MEQ/100ML IV SOLN
10.0000 meq | INTRAVENOUS | Status: AC
  Administered 2022-09-09 (×3): 10 meq via INTRAVENOUS
  Filled 2022-09-09 (×2): qty 100

## 2022-09-09 NOTE — Discharge Summary (Signed)
Physician Discharge Summary  Derek Cooley. KDX:[id number] DOB: February 06, 1966 DOA: 09/06/2022  PCP: Jon Billings, NP  Admit date: 09/06/2022  Discharge date: 09/09/2022  Admitted From: Home.  Disposition:  Home.  Recommendations for Outpatient Follow-up:  Follow up with PCP in 1-2 weeks. Please obtain BMP/CBC in one week. Advised to follow-up with Nephrology for continuation of hemodialysis. Advised to continue current medications as prescribed  Home Health:None Equipment/Devices:None  Discharge Condition: Stable CODE STATUS:Full code Diet recommendation: Heart Healthy   [facility] Course: This 56 years old male with PMH significant for ESRD started on hemodialysis in July 2023, COPD on home oxygen at 3 L at baseline, DM, hypertension, OSA on CPAP, anemia of chronic kidney disease, depression with anxiety, tobacco use disorder, polysubstance abuse, HFp EF presented in the ED following an accidental fall in which she tripped on the sidewalk ,twisting his right leg and hitting his left shoulder.  Patient denies head injury or loss of consciousness. He was confused after he arrived in the ED and was found to be hypoxic with increased oxygen requirement.  Patient became confused, desaturated to 71%, increased to 4 L of supplemental oxygen.  COVID-negative.  Patient was evaluated by trauma team,  all imaging negative.  CTA chest was ordered to rule out PE but was unable to be obtained without nephrology consultation.  Urine drug screen positive for cocaine and amphetamines. VQ scan completed low probability of PE.  Nephrology is consulted for continuation of hemodialysis.  Patient is admitted for further evaluation.  Patient continued on hemodialysis.  His mental status back to baseline.  Nephrology signed off, recommended patient can continue hemodialysis outpatient.  Patient was counseled about refrain from using substances like cocaine and marijuana.  He wants to be  discharged.  Patient is being discharged home.   Discharge Diagnoses:  Principal Problem:   Acute on chronic respiratory failure with hypoxia and hypercapnia (HCC) Active Problems:   Acute metabolic encephalopathy   Fall at home, initial encounter   Chronic obstructive pulmonary disease (COPD) ([place])   Type II diabetes mellitus with renal manifestations (HCC)   OSA (obstructive sleep apnea)   Chronic heart failure with preserved ejection fraction (HFpEF) (HCC)   ESRD on hemodialysis (HCC)   Polysubstance abuse (HCC)   Acute pain of right knee and left shoulder secondary to fall   Acute on chronic respiratory failure (HCC)  Acute on chronic hypoxic and hypercapnic respiratory failure: Etiology unknown at the time of arrival. ABG showed PCO2 50, Normal pH.  O2 sats in the 70s on home flow rate of oxygen. D-dimer elevated, VQ scan completed,  low probability for PE. Continue supplemental oxygen and wean as tolerated.   Weaned down to room air.   Acute metabolic encephalopathy:> Resolved. Suspected could be substance abuse related. CT head negative for any intracranial abnormality. UDS : Amphetamine+, cocaine+ No stigmata of acute infection Continue supplemental oxygen , now weaned down to room air. Patient back to baseline mental status.   Fall at home, initial encounter: Continue fall precautions. Trauma imaging completed no evidence of acute injury.   COPD: Not in any acute exacerbation. DuoNebs as needed.  Continue home inhalers.   Type 2 diabetes with renal manifestation: Carb modified diet.  Continue sliding scale insulin coverage while inpatient.   Acute pain of right knee and left shoulder secondary to fall Adequate pain control with nonnarcotics given altered mentation.   Polysubstance abuse: UDS positive for cocaine and amphetamines Counseled on substance abuse  abstinence.   ESRD on hemodialysis: Nephrology consulted for continuation of hemodialysis.    Chronic diastolic heart failure: Clinically euvolemic Continue carvedilol, hydralazine and torsemide   Obstructive sleep apnea: Continue CPAP at bedtime   Obesity: Body mass index is 33.94 kg/m.  Diet and exercise discussed in detail.  Discharge Instructions  Discharge Instructions     Call MD for:  difficulty breathing, headache or visual disturbances   Complete by: As directed    Call MD for:  persistant dizziness or light-headedness   Complete by: As directed    Call MD for:  persistant nausea and vomiting   Complete by: As directed    Diet - low sodium heart healthy   Complete by: As directed    Diet Carb Modified   Complete by: As directed    Discharge instructions   Complete by: As directed    Advised to follow-up with primary care physician in 1 week.   Advised to follow-up with nephrology for continuation of hemodialysis. Advised to continue current medications as prescribed   Increase activity slowly   Complete by: As directed    No wound care   Complete by: As directed       Allergies as of 09/09/2022       Reactions   Atorvastatin    Joint Aches - Severe Joint Aches - Severe Joint Aches - Severe   Avelox [moxifloxacin Hcl In Nacl]    Muscle pain   Buprenorphine    Mouth sores, confusion, shaking   Dilaudid [hydromorphone Hcl] Hives   Fluoxetine Itching   Levofloxacin Other (See Comments)   Joint Pain   Morphine And Related    Other    Muscle pain   Suboxone [buprenorphine Hcl-naloxone Hcl] Other (See Comments)   Rash and confused   Vancomycin    Renal insufficiency        Medication List     STOP taking these medications    budesonide-formoterol 160-4.5 MCG/ACT inhaler Commonly known as: Symbicort   calcitRIOL 0.25 MCG capsule Commonly known as: ROCALTROL   ezetimibe 10 MG tablet Commonly known as: ZETIA   patiromer 8.4 g packet Commonly known as: VELTASSA   Repatha SureClick 195 MG/ML Soaj Generic drug: Evolocumab    silver sulfADIAZINE 1 % cream Commonly known as: SILVADENE   traZODone 100 MG tablet Commonly known as: DESYREL       TAKE these medications    albuterol 108 (90 Base) MCG/ACT inhaler Commonly known as: VENTOLIN HFA Inhale 1-2 puffs into the lungs every 6 (six) hours as needed for wheezing or shortness of breath.   amLODipine 10 MG tablet Commonly known as: NORVASC TAKE 1 TABLET(10 MG) BY MOUTH DAILY   aspirin EC 81 MG tablet Take 1 tablet (81 mg total) by mouth daily. Swallow whole.   carvedilol 25 MG tablet Commonly known as: COREG TAKE 1 TABLET(25 MG) BY MOUTH TWICE DAILY WITH A MEAL   DULoxetine 60 MG capsule Commonly known as: CYMBALTA Take 1 capsule (60 mg total) by mouth daily.   hydrALAZINE 100 MG tablet Commonly known as: APRESOLINE Take 100 mg by mouth 2 (two) times daily. What changed: Another medication with the same name was removed. Continue taking this medication, and follow the directions you see here.   hydrOXYzine 50 MG tablet Commonly known as: ATARAX TAKE ONE TABLET 3 TIMES DAILY AS NEEDED   multivitamin Tabs tablet Take 1 tablet by mouth daily.   OneTouch Ultra test strip Generic drug: glucose  blood USE TO TEST BLOOD SUGAR DAILY   OXYGEN Inhale 3 L into the lungs daily.   pantoprazole 40 MG tablet Commonly known as: PROTONIX TAKE 1 TABLET(40 MG) BY MOUTH TWICE DAILY AS NEEDED   pregabalin 50 MG capsule Commonly known as: LYRICA Take 1 capsule (50 mg total) by mouth daily.   topiramate 50 MG tablet Commonly known as: TOPAMAX TAKE 1 TABLET(50 MG) BY MOUTH TWICE DAILY   torsemide 20 MG tablet Commonly known as: DEMADEX TAKE 2 TABLETS(40 MG) BY MOUTH DAILY        Follow-up Information     Jon Billings, NP .   Specialty: Nurse Practitioner Contact information: [place] [address] [phone]         Jon Billings, NP Follow up in 1 week(s).   Specialty: Nurse Practitioner Contact  information: [place] [address] [phone]         Wellington Hampshire, MD .   Specialty: Cardiology Contact information: [address] [place] [address] [phone]                Allergies  Allergen Reactions   Atorvastatin     Joint Aches - Severe Joint Aches - Severe Joint Aches - Severe   Avelox [Moxifloxacin Hcl In Nacl]     Muscle pain   Buprenorphine     Mouth sores, confusion, shaking   Dilaudid [Hydromorphone Hcl] Hives   Fluoxetine Itching   Levofloxacin Other (See Comments)    Joint Pain   Morphine And Related    Other     Muscle pain   Suboxone [Buprenorphine Hcl-Naloxone Hcl] Other (See Comments)    Rash and confused   Vancomycin     Renal insufficiency    Consultations: Nephrology   Procedures/Studies: NM Pulmonary Perfusion  Result Date: 09/07/2022 CLINICAL DATA:  History of asthma and emphysema. EXAM: NUCLEAR MEDICINE PERFUSION LUNG SCAN TECHNIQUE: Perfusion images were obtained in multiple projections after intravenous injection of radiopharmaceutical. Ventilation scans intentionally deferred if perfusion scan and chest x-ray adequate for interpretation during COVID 19 epidemic. RADIOPHARMACEUTICALS:  4.13 mCi Tc-50m MAA IV COMPARISON:  Chest radiograph dated September 06, 2022 FINDINGS: No segmental perfusion defect to suggest pulmonary embolism. IMPRESSION: Normal or low probability of pulmonary embolism. Electronically Signed   By: Keane Police D.O.   On: 09/07/2022 12:27   DG Knee Complete 4 Views Right  Result Date: 09/06/2022 CLINICAL DATA:  Right knee pain after fall EXAM: RIGHT KNEE - COMPLETE 4+ VIEW COMPARISON:  Radiographs 08/07/2019 FINDINGS: No acute fracture or dislocation. Advanced hypertrophic degenerative arthritis right knee with advanced tricompartmental joint space narrowing. Bone staples intact and unchanged in the distal femur and proximal tibia. Small knee joint effusion.  IMPRESSION: No acute fracture or dislocation. Advanced tricompartmental degenerative arthritis. Electronically Signed   By: Placido Sou M.D.   On: 09/06/2022 20:03   DG Shoulder Left  Result Date: 09/06/2022 CLINICAL DATA:  Fall EXAM: LEFT SHOULDER - 2+ VIEW COMPARISON:  None Available. FINDINGS: Degenerative changes in the glenohumeral joint with spurring. No acute bony abnormality. Specifically, no fracture, subluxation, or dislocation. IMPRESSION: No acute bony abnormality. Electronically Signed   By: Rolm Baptise M.D.   On: 09/06/2022 17:10   DG Chest 2 View  Result Date: 09/06/2022 CLINICAL DATA:  Fall EXAM: CHEST - 2 VIEW COMPARISON:  06/30/2022 FINDINGS: Right dialysis catheter remains in place, unchanged. Heart and mediastinal contours are within normal limits.  No focal opacities or effusions. No acute bony abnormality. IMPRESSION: No active cardiopulmonary disease. Electronically Signed   By: Rolm Baptise M.D.   On: 09/06/2022 17:09   CT Head Wo Contrast  Result Date: 09/06/2022 CLINICAL DATA:  Fall EXAM: CT HEAD WITHOUT CONTRAST CT CERVICAL SPINE WITHOUT CONTRAST TECHNIQUE: Multidetector CT imaging of the head and cervical spine was performed following the standard protocol without intravenous contrast. Multiplanar CT image reconstructions of the cervical spine were also generated. RADIATION DOSE REDUCTION: This exam was performed according to the departmental dose-optimization program which includes automated exposure control, adjustment of the mA and/or kV according to patient size and/or use of iterative reconstruction technique. COMPARISON:  CT head 06/23/2022, CT cervical spine 03/24/2019 FINDINGS: CT HEAD FINDINGS Brain: There is no acute intracranial hemorrhage, extra-axial fluid collection, or acute infarct. Parenchymal volume is normal. The ventricles are normal in size. There is encephalomalacia in the left anterior temporal lobe, unchanged. Small remote lacunar infarcts in the  right thalamus, left lentiform nucleus, and right external capsule are noted. There is a small region of encephalomalacia in the left frontal lobe presumably related to a prior catheter given overlying skull defect. There is no solid mass lesion. There is dural thickening underlying the right-sided craniotomy, unchanged since the prior study. There is no mass effect or midline shift. 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 Vascular: No hyperdense vessel or unexpected calcification. Skull: Postsurgical changes reflecting right frontotemporal craniotomy are noted. Sinuses/Orbits: Mucosal thickening and layering fluid is again seen in the bilateral maxillary sinuses with associated hyperostosis. The globes and orbits are unremarkable. Other: None CT CERVICAL SPINE FINDINGS Alignment: There is no antero or retrolisthesis. There is no jumped or perched facet or other evidence of traumatic malalignment. Skull base and vertebrae: Skull base alignment is maintained. Vertebral body heights are preserved. There is no evidence of acute fracture. There is no suspicious osseous lesion. Soft tissues and spinal canal: No prevertebral fluid or swelling. No visible canal hematoma. Disc levels: There is disc space narrowing and degenerative endplate change most advanced at C3-C4 and C5-C6, similar to the prior study. There is no evidence of high-grade spinal canal stenosis. Upper chest: The imaged lung apices are clear. Other: None. IMPRESSION: 1. Stable noncontrast head CT with no acute intracranial pathology. 2. No acute fracture or traumatic malalignment of the cervical spine. 3. Chronic maxillary sinusitis with layering fluid which again raises the possibility of acute sinusitis in the correct clinical setting. Electronically Signed   By: Valetta Mole M.D.   On: 09/06/2022 16:53   CT Cervical Spine Wo Contrast  Result Date: 09/06/2022 CLINICAL DATA:  Fall EXAM: CT HEAD  WITHOUT CONTRAST CT CERVICAL SPINE WITHOUT CONTRAST TECHNIQUE: Multidetector CT imaging of the head and cervical spine was performed following the standard protocol without intravenous contrast. Multiplanar CT image reconstructions of the cervical spine were also generated. RADIATION DOSE REDUCTION: This exam was performed according to the departmental dose-optimization program which includes automated exposure control, adjustment of the mA and/or kV according to patient size and/or use of iterative reconstruction technique. COMPARISON:  CT head 06/23/2022, CT cervical spine 03/24/2019 FINDINGS: CT HEAD FINDINGS Brain: There is no acute intracranial hemorrhage, extra-axial fluid collection, or acute infarct. Parenchymal volume is normal. The ventricles are normal in size. There is encephalomalacia in the left anterior temporal lobe, unchanged. Small remote lacunar infarcts in the right thalamus, left lentiform nucleus, and right external capsule are noted. There is a  small region of encephalomalacia in the left frontal lobe presumably related to a prior catheter given overlying skull defect. There is no solid mass lesion. There is dural thickening underlying the right-sided craniotomy, unchanged since the prior study. There is no mass effect or midline shift. 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 Vascular: No hyperdense vessel or unexpected calcification. Skull: Postsurgical changes reflecting right frontotemporal craniotomy are noted. Sinuses/Orbits: Mucosal thickening and layering fluid is again seen in the bilateral maxillary sinuses with associated hyperostosis. The globes and orbits are unremarkable. Other: None CT CERVICAL SPINE FINDINGS Alignment: There is no antero or retrolisthesis. There is no jumped or perched facet or other evidence of traumatic malalignment. Skull base and vertebrae: Skull base alignment is maintained. Vertebral body heights are  preserved. There is no evidence of acute fracture. There is no suspicious osseous lesion. Soft tissues and spinal canal: No prevertebral fluid or swelling. No visible canal hematoma. Disc levels: There is disc space narrowing and degenerative endplate change most advanced at C3-C4 and C5-C6, similar to the prior study. There is no evidence of high-grade spinal canal stenosis. Upper chest: The imaged lung apices are clear. Other: None. IMPRESSION: 1. Stable noncontrast head CT with no acute intracranial pathology. 2. No acute fracture or traumatic malalignment of the cervical spine. 3. Chronic maxillary sinusitis with layering fluid which again raises the possibility of acute sinusitis in the correct clinical setting. Electronically Signed   By: Valetta Mole M.D.   On: 09/06/2022 16:53   VAS US DUPLEX DIALYSIS ACCESS (AVF, AVG)  Result Date: 08/17/2022 DIALYSIS ACCESS Patient Name:  Derek Cooley.  Date of Exam:   08/15/2022 Medical Rec #: [id number]           Accession #:    [id number] Date of Birth: 02-12-1966          Patient Gender: M Patient Age:   17 years Exam Location:  [facility] Procedure:      VAS US DUPLEX DIALYSIS ACCESS (AVF, AVG) Referring Phys: Leotis Pain --------------------------------------------------------------------------------  Access Site: Left Upper Extremity. Access Type: Radial-cephalic AVF. History: 07/16/2022 Percutaneous transluminal angioplasty of the left cephalic          vein at the antecubital fossa. Performing Technologist: Delorise Shiner RVT  Examination Guidelines: A complete evaluation includes B-mode imaging, spectral Doppler, color Doppler, and power Doppler as needed of all accessible portions of each vessel. Unilateral testing is considered an integral part of a complete examination. Limited examinations for reoccurring indications may be performed as noted.  Findings: +--------------------+----------+-----------------+--------+ AVF                 PSV  (cm/s)Flow Vol (mL/min)Comments +--------------------+----------+-----------------+--------+ Native artery inflow   193           522                +--------------------+----------+-----------------+--------+ AVF Anastomosis        449                              +--------------------+----------+-----------------+--------+  +---------------+----------+-------------+----------+--------+ OUTFLOW VEIN   PSV (cm/s)Diameter (cm)Depth (cm)Describe +---------------+----------+-------------+----------+--------+ Subclavian vein    69                                    +---------------+----------+-------------+----------+--------+ Axillary vein  62        0.68                        +---------------+----------+-------------+----------+--------+ Prox UA                      0.73                        +---------------+----------+-------------+----------+--------+ Mid UA             55        0.49               basilic  +---------------+----------+-------------+----------+--------+ Dist UA           151        0.60               Basilic  +---------------+----------+-------------+----------+--------+ AC Fossa          168        0.58                        +---------------+----------+-------------+----------+--------+ Prox Forearm      142        0.42                        +---------------+----------+-------------+----------+--------+ Mid Forearm       133        0.36                        +---------------+----------+-------------+----------+--------+ Dist Forearm      300        0.39                        +---------------+----------+-------------+----------+--------+ Upper arm cephalic vein is occluded with no flow seen within.   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  AVF outflow connects from the cephalic vein in the forearm to the basilic vein at the antecubital fossa.  *See table(s) above for  measurements and observations.  Diagnosing physician: Leotis Pain MD Electronically signed by Leotis Pain MD on 08/17/2022 at 8:56:41 AM.   --------------------------------------------------------------------------------   Final     Subjective: Patient was seen and examined at bedside.  Overnight events noted.   Patient feels much improved.  He wants to be discharged.  Cleared from nephrology to be discharged.  Discharge Exam: Vitals:   09/09/22 0328 09/09/22 0844  BP: (!) 166/66 (!) 142/96  Pulse: 75 77  Resp: 18 18  Temp: 98.4 F (36.9 C) 97.8 F (36.6 C)  SpO2: (!) 86% 96%   Vitals:   09/08/22 1622 09/08/22 1903 09/09/22 0328 09/09/22 0844  BP: (!) 140/88 (!) 126/115 (!) 166/66 (!) 142/96  Pulse: 86 (!) 52 75 77  Resp: 20 18 18 18   Temp: 97.9 F (36.6 C) 98.5 F (36.9 C) 98.4 F (36.9 C) 97.8 F (36.6 C)  TempSrc:      SpO2: 99% (!) 87% (!) 86% 96%  Weight:      Height:        General: Pt is alert, awake, not in acute distress Cardiovascular: RRR, S1/S2 +, no rubs, no gallops Respiratory: CTA bilaterally, no wheezing, no rhonchi Abdominal: Soft, NT, ND, bowel sounds + Extremities: no edema, no cyanosis    The results of significant  diagnostics from this hospitalization (including imaging, microbiology, ancillary and laboratory) are listed below for reference.     Microbiology: Recent Results (from the past 240 hour(s))  SARS Coronavirus 2 by RT PCR (hospital order, performed in [facility] hospital lab) *cepheid single result test* Anterior Nasal Swab     Status: None   Collection Time: 09/06/22 10:19 PM   Specimen: Anterior Nasal Swab  Result Value Ref Range Status   SARS Coronavirus 2 by RT PCR NEGATIVE NEGATIVE Final    Comment: (NOTE) SARS-CoV-2 target nucleic acids are NOT DETECTED.  The SARS-CoV-2 RNA is generally detectable in upper and lower respiratory specimens during the acute phase of infection. The lowest concentration of SARS-CoV-2 viral copies this  assay can detect is 250 copies / mL. A negative result does not preclude SARS-CoV-2 infection and should not be used as the sole basis for treatment or other patient management decisions.  A negative result may occur with improper specimen collection / handling, submission of specimen other than nasopharyngeal swab, presence of viral mutation(s) within the areas targeted by this assay, and inadequate number of viral copies (<250 copies / mL). A negative result must be combined with clinical observations, patient history, and epidemiological information.  Fact Sheet for Patients:   https://www.patel.info/  Fact Sheet for Healthcare Providers: https://hall.com/  This test is not yet approved or  cleared by the [place] FDA and has been authorized for detection and/or diagnosis of SARS-CoV-2 by FDA under an Emergency Use Authorization (EUA).  This EUA will remain in effect (meaning this test can be used) for the duration of the COVID-19 declaration under Section 564(b)(1) of the Act, 21 U.S.C. section 360bbb-3(b)(1), unless the authorization is terminated or revoked sooner.  Performed at [facility], [address] [place], [place] [address]      Labs: BNP (last 3 results) Recent Labs    11/25/21 1117  BNP 354.5*   Basic Metabolic Panel: Recent Labs  Lab 09/06/22 1615 09/07/22 0403 09/08/22 0756 09/09/22 0705  NA 140  --  136 135  K 3.7  --  3.2* 2.8*  CL 95*  --  96* 97*  CO2 29  --  27 25  GLUCOSE 103*  --  108* 115*  BUN 34*  --  47* 49*  CREATININE 4.75* 5.30* 5.70* 5.47*  CALCIUM 9.3  --  9.1 9.2  MG  --   --  2.2 2.1  PHOS  --   --  5.7* 6.4*   Liver Function Tests: Recent Labs  Lab 09/06/22 1615  AST 26  ALT 16  ALKPHOS 92  BILITOT 1.5*  PROT 9.1*  ALBUMIN 5.0   No results for input(s): "LIPASE", "AMYLASE" in the last 168 hours. Recent Labs  Lab 09/06/22 2342  AMMONIA 29    CBC: Recent Labs  Lab 09/06/22 1615 09/07/22 0403 09/08/22 0756 09/09/22 0705  WBC 10.0 8.8 8.0 8.4  NEUTROABS 7.2  --   --   --   HGB 13.1 12.2* 14.2 14.1  HCT 40.0 36.8* 41.8 41.1  MCV 90.9 90.9 88.2 86.7  PLT 262 231 268 247   Cardiac Enzymes: No results for input(s): "CKTOTAL", "CKMB", "CKMBINDEX", "TROPONINI" in the last 168 hours. BNP: Invalid input(s): "POCBNP" CBG: Recent Labs  Lab 09/08/22 2158 09/09/22 0010 09/09/22 0327 09/09/22 0845 09/09/22 1136  GLUCAP 172* 175* 137* 130* 108*   D-Dimer Recent Labs    09/07/22 0403  DDIMER 1.54*   Hgb A1c No results  for input(s): "HGBA1C" in the last 72 hours. Lipid Profile No results for input(s): "CHOL", "HDL", "LDLCALC", "TRIG", "CHOLHDL", "LDLDIRECT" in the last 72 hours. Thyroid function studies No results for input(s): "TSH", "T4TOTAL", "T3FREE", "THYROIDAB" in the last 72 hours.  Invalid input(s): "FREET3" Anemia work up No results for input(s): "VITAMINB12", "FOLATE", "FERRITIN", "TIBC", "IRON", "RETICCTPCT" in the last 72 hours. Urinalysis    Component Value Date/Time   COLORURINE YELLOW (A) 09/06/2022 2342   APPEARANCEUR HAZY (A) 09/06/2022 2342   APPEARANCEUR Clear 04/17/2021 1544   LABSPEC 1.010 09/06/2022 2342   LABSPEC 1.029 06/26/2012 1231   PHURINE 5.0 09/06/2022 2342   GLUCOSEU NEGATIVE 09/06/2022 2342   GLUCOSEU Negative 06/26/2012 1231   HGBUR NEGATIVE 09/06/2022 2342   BILIRUBINUR NEGATIVE 09/06/2022 2342   BILIRUBINUR Negative 04/17/2021 1544   BILIRUBINUR Negative 06/26/2012 [facility] 09/06/2022 2342   PROTEINUR 100 (A) 09/06/2022 2342   NITRITE NEGATIVE 09/06/2022 2342   LEUKOCYTESUR NEGATIVE 09/06/2022 2342   LEUKOCYTESUR Negative 06/26/2012 1231   Sepsis Labs Recent Labs  Lab 09/06/22 1615 09/07/22 0403 09/08/22 0756 09/09/22 0705  WBC 10.0 8.8 8.0 8.4   Microbiology Recent Results (from the past 240 hour(s))  SARS Coronavirus 2 by RT PCR (hospital  order, performed in [facility] hospital lab) *cepheid single result test* Anterior Nasal Swab     Status: None   Collection Time: 09/06/22 10:19 PM   Specimen: Anterior Nasal Swab  Result Value Ref Range Status   SARS Coronavirus 2 by RT PCR NEGATIVE NEGATIVE Final    Comment: (NOTE) SARS-CoV-2 target nucleic acids are NOT DETECTED.  The SARS-CoV-2 RNA is generally detectable in upper and lower respiratory specimens during the acute phase of infection. The lowest concentration of SARS-CoV-2 viral copies this assay can detect is 250 copies / mL. A negative result does not preclude SARS-CoV-2 infection and should not be used as the sole basis for treatment or other patient management decisions.  A negative result may occur with improper specimen collection / handling, submission of specimen other than nasopharyngeal swab, presence of viral mutation(s) within the areas targeted by this assay, and inadequate number of viral copies (<250 copies / mL). A negative result must be combined with clinical observations, patient history, and epidemiological information.  Fact Sheet for Patients:   https://www.patel.info/  Fact Sheet for Healthcare Providers: https://hall.com/  This test is not yet approved or  cleared by the [place] FDA and has been authorized for detection and/or diagnosis of SARS-CoV-2 by FDA under an Emergency Use Authorization (EUA).  This EUA will remain in effect (meaning this test can be used) for the duration of the COVID-19 declaration under Section 564(b)(1) of the Act, 21 U.S.C. section 360bbb-3(b)(1), unless the authorization is terminated or revoked sooner.  Performed at [facility], [address], [place], [place] [address]      Time coordinating discharge: Over 30 minutes  SIGNED:   Shawna Clamp, MD  [facility] 09/09/2022, 3:53 PM Pager   If 7PM-7AM, please contact  night-coverage

## 2022-09-09 NOTE — Progress Notes (Signed)
Mobility Specialist - Progress Note   09/09/22 1144  Mobility  Activity Ambulated independently in hallway;Stood at bedside;Dangled on edge of bed  Level of Assistance Independent  Assistive Device None  Distance Ambulated (ft) 160 ft  Activity Response Tolerated well  $Mobility charge 1 Mobility   Pt EOB on RA upon arrival. Pt STS and ambulates 1 lap around NS Indep. Pt returns to  EOB with needs in reach.   Derek Cooley  Mobility Specialist  09/09/22 11:46 AM

## 2022-09-09 NOTE — Progress Notes (Signed)
Patient is alert and oriented x4. Denied pain tonight. CHG wipes given to patient and he was educated on how to use them. Patient is up ad lib and follows commands. Speaks about family issues with a sense of longing and sadness. He apologized for the events of the previous night. Sister is at bedside. Patient's blood glucose was 69/175/137. Patient ate a sandwich tray over the night which stabilized his blood sugar from [phone]. Denied additional needs. Will continue to follow care plan.

## 2022-09-09 NOTE — Plan of Care (Signed)
IV removed, discharge instructions reviewed and patient discharged to home with sister

## 2022-09-09 NOTE — Progress Notes (Signed)
[facility]  ROUNDING NOTE   Subjective:   Derek Cooley. is a 56 year old male with past medical conditions including COPD on home oxygen at 3 L, diabetes, struct of sleep apnea, anemia, hypertension, tobacco use, polysubstance abuse, CHF, end-stage renal disease on hemodialysis.  Patient presents to the emergency room after suffering a fall and complaining of right leg and left shoulder pain.  Patient has been admitted for Confusion [R41.0] Musculoskeletal pain [M79.18] Fall, initial encounter [W19.XXXA] Acute on chronic respiratory failure with hypoxia (HCC) [J96.21] Acute on chronic respiratory failure ([place]) [J96.20]  Patient is known to our practice and receives outpatient dialysis treatments at [facility] on a TTS schedule, supervised by Dr. Holley Raring. Patient presented to the emergency department with confusion and shortness of breath after a fall. We have been consulted to manage dialysis needs during this admission.   Patient was seen and examined at bedside.  He is awake alert and following commands. He stated that his left shoulder is still hurting.     Objective:  Vital signs in last 24 hours:  Temp:  [97.8 F (36.6 C)-98.5 F (36.9 C)] 97.8 F (36.6 C) (10/01 0844) Pulse Rate:  [52-86] 77 (10/01 0844) Resp:  [18-25] 18 (10/01 0844) BP: (121-166)/(66-115) 142/96 (10/01 0844) SpO2:  [86 %-99 %] 96 % (10/01 0844) Weight:  [98.3 kg] 98.3 kg (09/30 1244)  Weight change:  Filed Weights   09/06/22 1610 09/08/22 0901 09/08/22 1244  Weight: 105.7 kg 100.6 kg 98.3 kg    Intake/Output: I/O last 3 completed shifts: In: 720 [P.O.:720] Out: 2000 [Other:2000]   Intake/Output this shift:  No intake/output data recorded.  Physical Exam: General: NAD. Following commands.   Head: Normocephalic, atraumatic. Moist oral mucosal membranes  Eyes: Anicteric  Lungs:  Clear to auscultation, normal effort  Heart: Regular rate and rhythm  Abdomen:  Soft,  nontender  Extremities: No peripheral edema.  Neurologic: Nonfocal, moving all four extremities  Skin: No lesions  Access: Right chest PermCath, left lower aVF (maturing)    Basic Metabolic Panel: Recent Labs  Lab 09/06/22 1615 09/07/22 0403 09/08/22 0756 09/09/22 0705  NA 140  --  136 135  K 3.7  --  3.2* 2.8*  CL 95*  --  96* 97*  CO2 29  --  27 25  GLUCOSE 103*  --  108* 115*  BUN 34*  --  47* 49*  CREATININE 4.75* 5.30* 5.70* 5.47*  CALCIUM 9.3  --  9.1 9.2  MG  --   --  2.2 2.1  PHOS  --   --  5.7* 6.4*     Liver Function Tests: Recent Labs  Lab 09/06/22 1615  AST 26  ALT 16  ALKPHOS 92  BILITOT 1.5*  PROT 9.1*  ALBUMIN 5.0    No results for input(s): "LIPASE", "AMYLASE" in the last 168 hours. Recent Labs  Lab 09/06/22 2342  AMMONIA 29     CBC: Recent Labs  Lab 09/06/22 1615 09/07/22 0403 09/08/22 0756 09/09/22 0705  WBC 10.0 8.8 8.0 8.4  NEUTROABS 7.2  --   --   --   HGB 13.1 12.2* 14.2 14.1  HCT 40.0 36.8* 41.8 41.1  MCV 90.9 90.9 88.2 86.7  PLT 262 231 268 247     Cardiac Enzymes: No results for input(s): "CKTOTAL", "CKMB", "CKMBINDEX", "TROPONINI" in the last 168 hours.  BNP: Invalid input(s): "POCBNP"  CBG: Recent Labs  Lab 09/08/22 1900 09/08/22 2158 09/09/22 0010 09/09/22 0327 09/09/22  [place]     Microbiology: Results for orders placed or performed during the hospital encounter of 09/06/22  SARS Coronavirus 2 by RT PCR (hospital order, performed in [facility] hospital lab) *cepheid single result test* Anterior Nasal Swab     Status: None   Collection Time: 09/06/22 10:19 PM   Specimen: Anterior Nasal Swab  Result Value Ref Range Status   SARS Coronavirus 2 by RT PCR NEGATIVE NEGATIVE Final    Comment: (NOTE) SARS-CoV-2 target nucleic acids are NOT DETECTED.  The SARS-CoV-2 RNA is generally detectable in upper and lower respiratory specimens during the acute phase of infection. The  lowest concentration of SARS-CoV-2 viral copies this assay can detect is 250 copies / mL. A negative result does not preclude SARS-CoV-2 infection and should not be used as the sole basis for treatment or other patient management decisions.  A negative result may occur with improper specimen collection / handling, submission of specimen other than nasopharyngeal swab, presence of viral mutation(s) within the areas targeted by this assay, and inadequate number of viral copies (<250 copies / mL). A negative result must be combined with clinical observations, patient history, and epidemiological information.  Fact Sheet for Patients:   https://www.patel.info/  Fact Sheet for Healthcare Providers: https://hall.com/  This test is not yet approved or  cleared by the [place] FDA and has been authorized for detection and/or diagnosis of SARS-CoV-2 by FDA under an Emergency Use Authorization (EUA).  This EUA will remain in effect (meaning this test can be used) for the duration of the COVID-19 declaration under Section 564(b)(1) of the Act, 21 U.S.C. section 360bbb-3(b)(1), unless the authorization is terminated or revoked sooner.  Performed at [facility], [address] [place], [place] [address]     Coagulation Studies: No results for input(s): "LABPROT", "INR" in the last 72 hours.  Urinalysis: Recent Labs    09/06/22 2342  COLORURINE YELLOW*  LABSPEC 1.010  PHURINE 5.0  GLUCOSEU NEGATIVE  HGBUR NEGATIVE  BILIRUBINUR NEGATIVE  KETONESUR NEGATIVE  PROTEINUR 100*  NITRITE NEGATIVE  LEUKOCYTESUR NEGATIVE       Imaging: NM Pulmonary Perfusion  Result Date: 09/07/2022 CLINICAL DATA:  History of asthma and emphysema. EXAM: NUCLEAR MEDICINE PERFUSION LUNG SCAN TECHNIQUE: Perfusion images were obtained in multiple projections after intravenous injection of radiopharmaceutical. Ventilation scans intentionally deferred  if perfusion scan and chest x-ray adequate for interpretation during COVID 19 epidemic. RADIOPHARMACEUTICALS:  4.13 mCi Tc-50m MAA IV COMPARISON:  Chest radiograph dated September 06, 2022 FINDINGS: No segmental perfusion defect to suggest pulmonary embolism. IMPRESSION: Normal or low probability of pulmonary embolism. Electronically Signed   By: Keane Police D.O.   On: 09/07/2022 12:27     Medications:    anticoagulant sodium citrate     potassium chloride 10 mEq (09/09/22 1104)    Chlorhexidine Gluconate Cloth  6 each Topical Q0600   heparin  5,000 Units Subcutaneous Q8H   insulin aspart  0-20 Units Subcutaneous Q4H   nicotine  21 mg Transdermal Daily   acetaminophen **OR** acetaminophen, albuterol, alteplase, anticoagulant sodium citrate, heparin, lidocaine (PF), lidocaine-prilocaine, ondansetron **OR** ondansetron (ZOFRAN) IV, pentafluoroprop-tetrafluoroeth, technetium albumin aggregated  Assessment/ Plan:  Mr. Daneil Beem. is a 56 y.o.  male with past medical conditions including COPD on home oxygen at 3 L, diabetes, struct of sleep apnea, anemia, hypertension, tobacco use, polysubstance abuse, CHF, end-stage renal disease on hemodialysis.  Patient presents to the emergency room  after suffering a fall and complaining of right leg and left shoulder pain.  Patient has been admitted for Confusion [R41.0] Musculoskeletal pain [M79.18] Fall, initial encounter [W19.XXXA] Acute on chronic respiratory failure with hypoxia (HCC) [J96.21] Acute on chronic respiratory failure (HCC) [J96.20]  [facility]S/Rt Permcath  End-stage renal disease on hemodialysis.  Will maintain outpatient schedule if possible.  S/P treatment yesterday.  We will continue with dialysis schedule on TTS.   2. Anemia of chronic kidney disease  Lab Results  Component Value Date   HGB 14.1 09/09/2022    Hemoglobin within desired target.  We will continue to monitor.  3. Secondary Hyperparathyroidism:  with outpatient labs: PTH 130, phosphorus 4.6, calcium 8.8 on 06/18/22.   Lab Results  Component Value Date   PTH 135 (H) 12/07/2021   PTH Comment 12/07/2021   CALCIUM 9.2 09/09/2022   PHOS 6.4 (H) 09/09/2022  Calcium within acceptable target.  Phosphorus level slightly went up to 6.4.  Follow-up labs  4.  Hypertension with chronic kidney disease.  Home regimen includes amlodipine, carvedilol, hydralazine, and torsemide which are on hold at present.  Will resume when appropriate.    LOS: 2 Jerrik Housholder P Shadonna Benedick 10/1/202311:07 AM

## 2022-09-10 ENCOUNTER — Telehealth: Payer: Self-pay | Admitting: *Deleted

## 2022-09-10 NOTE — Telephone Encounter (Signed)
Transition Care Management Follow-up Telephone Call Date of discharge and from where: [facility] 09-09-2022 How have you been since you were released from the hospital? He is doing ok per sister Any questions or concerns? No  Items Reviewed: Did the pt receive and understand the discharge instructions provided? Yes  Medications obtained and verified?  Other? No  Any new allergies since your discharge? No  Dietary orders reviewed? No Do you have support at home? Yes   Home Care and Equipment/Supplies: Were home health services ordered?  If so, what is the name of the agency?   Has the agency set up a time to come to the patient's home?  Were any new equipment or medical supplies ordered?  What is the name of the medical supply agency?  Were you able to get the supplies/equipment?  Do you have any questions related to the use of the equipment or supplies?   Functional Questionnaire: (I = Independent and D = Dependent) ADLs: I  Bathing/Dressing- I  Meal Prep- I  Eating- I  Maintaining continence- I  Transferring/Ambulation- I  Managing Meds- I  Sister helps with all needs patient can't do and takes to all appointments.  Follow up appointments reviewed:  PCP Hospital f/u appt confirmed? No   SPOKE TO PATIENT SISTER UNABLE TO FIND A TIME THAT WORKED FOR HER CHECK ALL CLINICIANS AT OFFICE.  SHE WILL CALL BACK TO GET SOMETHING SCHEDULED FOR NEXT [facility] f/u appt confirmed?  NO  . Are transportation arrangements needed? No  If their condition worsens, is the pt aware to call PCP or go to the Emergency Dept.? Yes Was the patient provided with contact information for the PCP's office or ED? Yes Was to pt encouraged to call back with questions or concerns? Yes   [place] advisor

## 2022-09-11 ENCOUNTER — Other Ambulatory Visit: Payer: Self-pay | Admitting: Nurse Practitioner

## 2022-09-11 NOTE — Telephone Encounter (Signed)
Requested medications are due for refill today.  yes  Requested medications are on the active medications list.  yes  Last refill. 08/03/2022 #30 0 rf  Future visit scheduled.   no  Notes to clinic.  Refill failed protocol d/t abnormal labs.    Requested Prescriptions  Pending Prescriptions Disp Refills   DULoxetine (CYMBALTA) 60 MG capsule [Pharmacy Med Name: DULOXETINE DR 60MG CAPSULES] 30 capsule 0    Sig: TAKE 1 CAPSULE(60 MG) BY MOUTH DAILY     Psychiatry: Antidepressants - SNRI - duloxetine Failed - 09/11/2022 12:11 PM      Failed - Cr in normal range and within 360 days    Creatinine  Date Value Ref Range Status  08/12/2014 1.55 (H) 0.60 - 1.30 mg/dL Final   Creatinine, Ser  Date Value Ref Range Status  09/09/2022 5.47 (H) 0.61 - 1.24 mg/dL Final   Creatinine, Urine  Date Value Ref Range Status  11/26/2021 133 mg/dL Final         Failed - eGFR is 30 or above and within 360 days    EGFR (African American)  Date Value Ref Range Status  08/12/2014 >60  Final   GFR calc Af Amer  Date Value Ref Range Status  06/29/2020 65 >59 mL/min/1.73 Final    Comment:    **[facility] currently reports eGFR in compliance with the current**   recommendations of the [employer]. [facility] will   update reporting as new guidelines are published from the NKF-ASN   Task force.    EGFR (Non-African Amer.)  Date Value Ref Range Status  08/12/2014 52 (L)  Final    Comment:    eGFR values <64m/min/1.73 m2 may be an indication of chronic kidney disease (CKD). Calculated eGFR is useful in patients with stable renal function. The eGFR calculation will not be reliable in acutely ill patients when serum creatinine is changing rapidly. It is not useful in  patients on dialysis. The eGFR calculation may not be applicable to patients at the low and high extremes of body sizes, pregnant women, and vegetarians.    GFR, Estimated  Date Value Ref Range Status  09/09/2022 11  (L) >60 mL/min Final    Comment:    (NOTE) Calculated using the CKD-EPI Creatinine Equation (2021)    eGFR  Date Value Ref Range Status  02/20/2022 14 (L) >59 mL/min/1.73 Final         Failed - Last BP in normal range    BP Readings from Last 1 Encounters:  09/09/22 (!) 142/96         Passed - Completed PHQ-2 or PHQ-9 in the last 360 days      Passed - Valid encounter within last 6 months    Recent Outpatient Visits           1 month ago [place] chronic diastolic [place]ive [place]   [facility]   7 months ago Hypertension associated with diabetes ([facility]   9 months ago [facility]   1 year ago Hypertension associated with diabetes ([facility]   2 years [facility]      Future Appointments             In 1 month Furth, Cadence H, PA-C  [place] [place]

## 2022-10-08 ENCOUNTER — Encounter (INDEPENDENT_AMBULATORY_CARE_PROVIDER_SITE_OTHER): Payer: Self-pay

## 2022-10-16 ENCOUNTER — Encounter: Payer: Self-pay | Admitting: Emergency Medicine

## 2022-10-16 ENCOUNTER — Emergency Department
Admission: EM | Admit: 2022-10-16 | Discharge: 2022-10-16 | Discharge status: Against Medical Advice | Payer: Medicare Other | Attending: Emergency Medicine | Admitting: Emergency Medicine

## 2022-10-16 ENCOUNTER — Other Ambulatory Visit: Payer: Self-pay

## 2022-10-16 DIAGNOSIS — Z992 Dependence on renal dialysis: Secondary | ICD-10-CM | POA: Diagnosis not present

## 2022-10-16 DIAGNOSIS — Z5321 Procedure and treatment not carried out due to patient leaving prior to being seen by health care provider: Secondary | ICD-10-CM | POA: Insufficient documentation

## 2022-10-16 DIAGNOSIS — I10 Essential (primary) hypertension: Secondary | ICD-10-CM | POA: Diagnosis present

## 2022-10-16 NOTE — ED Notes (Signed)
Pt called for treatment room. Pt not in the lobby

## 2022-10-16 NOTE — ED Notes (Signed)
First Nurse Note: Pt to ED via [facility] from Dialysis for high blood pressure during his treatment. Pt was given BP medication at dialysis.  Pt BP: 198/114

## 2022-10-16 NOTE — ED Notes (Signed)
Called for treatment room, pt not in the lobby

## 2022-10-16 NOTE — ED Triage Notes (Signed)
Pt sent to ER via EMS by dialysis for HTN.  Pt states completed all of his treatment but 30 mins.  Pt states hx of HTN, states only symptom she is having is left sided headache.  Pt states BP was WNL limits on Saturday during dialysis.

## 2022-10-16 NOTE — ED Notes (Signed)
Pt called for treatment room

## 2022-10-22 ENCOUNTER — Encounter: Payer: Self-pay | Admitting: Medical

## 2022-10-22 ENCOUNTER — Ambulatory Visit: Payer: Medicare Other | Attending: Medical | Admitting: Medical

## 2022-10-22 NOTE — Progress Notes (Deleted)
Cardiology Office Note:    Date:  10/22/2022   ID:  Derek Cooley., DOB 1966-08-03, MRN [id number]  PCP:  Jon Billings, NP  [facility] Cardiologist:  Kathlyn Sacramento, MD  [facility] Electrophysiologist:  None   Referring MD: Jon Billings, NP   Chief Complaint: ***  History of Present Illness:    Derek Cooley. is a 56 y.o. male with a hx of HTN, diabetes mellitus, subarachnoid hemorrhage s/p craniotomy and clipping, sleep apnea, obesity, CKD stage IV, abdominal abscess with chronic MRSA infection, and polysubstance abuse.    In December 2022 he presented to [facility] with progressive dyspnea and was found to be markedly hypertensive. Labs showed normaocytic anemia with imld troponin elevated to a peak of 18. Echo showed normal LVEF with moderate LVH and G1DD. He was discharged home on amlodipine and and Coreg with recommendation for outpatient stress testing. He was readmitted to [facility] in the setting of increased somnolence. He was found to have rhinovirus and worsening AKI. HE was noncompliant with his medications at home and amlodipine and coreg were resumed. ER visit January 27 with headaches and elevated BP. Labs showed stable CKD. MRI was unremarkable. He was discharged home.    Seen in follow-up and BP was very high and he was treated in the office with clonidine. Ischemic testing was deferred given elevated BP.    Seen 01/2822 and he was started on hydralazine 50mg . This was increased at the follow-up visit.    Admitted 06/23/22-7/24 for SOB and LLE, BP was elevated. CXR showed vascular congestion and interstitial edema. Troponins were mildly elevated. Home BP meds were restarted and he was diuresed with IV lasix. Myoview showed no high risk ischemia. Nephrology decided to initiate dialysis during hospital stay so a Perm cath was placed. He was sent home on torsemide 40mg  for non dialysis days.   Last seen 07/19/22 and was overall doing OK.   Today,    Past Medical History:  Diagnosis Date   Abdominal abscess    a.) chronic MRSA infection   Acute pancreatitis    Anxiety    Asthma    Brain aneurysm    a.) 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 b.) RIGHT crainotomy for clip ligation 05/14/2019.   CHF (congestive heart failure) (HCC)    Chronic back pain    Chronic heart failure with preserved ejection fraction (HFpEF) ([place])    a. 11/2021 Echo: EF 55-60%, no rwma, mod LVH, GrI DD. Nl RV size/fxn. Mildly dil LA.   CKD (chronic kidney disease), stage V (HCC)    Depression    Emphysema of lung (HCC)    Erectile dysfunction    Followed by palliative care service    GERD (gastroesophageal reflux disease)    History of methicillin resistant staphylococcus aureus (MRSA)    HLD (hyperlipidemia)    Hypertension    Mild cognitive impairment    MRSA (methicillin resistant Staphylococcus aureus)    Obesity    OSA (obstructive sleep apnea) 2013   a.) not compliant with nocturnal PAP therapy; CPAP machine "lost or stolen".   Panic disorder    Perforated bowel ([place]) 12/10/2005   Tempoary Colostomy Bag, Skin Graft for Abd wound   Polysubstance abuse ([place])    a.) ETOH, tobacco, marijuana, methamphetamines, cocaine, BZO, opioids.   Subarachnoid hemorrhage ([place]) 01/26/2010   a.) spontaneous rupture --> SAH from RIGHT PComm --> coil embolization  01/26/2010 with known remaining neck remnant.   T2DM (type 2 diabetes mellitus) ([place])    Tobacco abuse    Type 2 diabetes mellitus without complication, without long-term current use of insulin ([place]) 04/17/2016   Vitreous hemorrhage ([place]) 03/27/2011   Overview:  Bilateral; 01/2010, from [facility]     Past Surgical History:  Procedure Laterality Date   A/V FISTULAGRAM Left 07/16/2022   Procedure: A/V Fistulagram;  Surgeon: Algernon Huxley, MD;  Location: [place] [facility];  Service: Cardiovascular;  Laterality: Left;   AV FISTULA  PLACEMENT Left 05/24/2022   Procedure: ARTERIOVENOUS (AV) FISTULA CREATION ( RADIAL CEPHALIC);  Surgeon: Algernon Huxley, MD;  Location: [facility];  Service: Vascular;  Laterality: Left;   CEREBRAL ANEURYSM REPAIR Right 01/26/2010   Procedure: CEREBRAL ANEURYSM REPAIR (COIL EMBOLIZATION)   CEREBRAL ANEURYSM REPAIR Right 05/14/2019   Procedure: CRAINOTOMY FOR CEREBRAL ANEURYSM REPAIR (CLIP LIGATION)   COLON SURGERY  12/10/2005   colostomy bag placed s/p perforated bowel   COLONOSCOPY WITH PROPOFOL N/A 05/18/2020   Procedure: COLONOSCOPY WITH PROPOFOL;  Surgeon: Lin Landsman, MD;  Location: [place];  Service: Gastroenterology;  Laterality: N/A;   DIALYSIS/PERMA CATHETER INSERTION N/A 06/27/2022   Procedure: DIALYSIS/PERMA CATHETER INSERTION;  Surgeon: Katha Cabal, MD;  Location: [place] [facility];  Service: Cardiovascular;  Laterality: N/A;   KNEE SURGERY Right    Perforated bowel      Current Medications: No outpatient medications have been marked as taking for the 10/22/22 encounter (Appointment) with Kathlen Mody, Shamone Winzer H, PA-C.     Allergies:   Atorvastatin, Avelox [moxifloxacin hcl in nacl], Buprenorphine, Dilaudid [hydromorphone hcl], Fluoxetine, Levofloxacin, Morphine and related, Other, Suboxone [buprenorphine hcl-naloxone hcl], and Vancomycin   Social History   Socioeconomic History   Marital status: Divorced    Spouse name: Not on file   Number of children: Not on file   Years of education: Not on file   Highest education level: Not on file  Occupational History   Not on file  Tobacco Use   Smoking status: Every Day    Packs/day: 0.50    Types: Cigarettes    Start date: 10/11/1984   Smokeless tobacco: Never  Vaping Use   Vaping Use: Never used  Substance and Sexual Activity   Alcohol use: Yes   Drug use: Not Currently    Types: "Crack" cocaine, Amphetamines, Methamphetamines, Benzodiazepines, Cocaine, Marijuana, Other-see comments    Comment: opioids    Sexual activity: Not Currently  Other Topics Concern   Not on file  Social History Narrative   Lives alone and has nursing assistance help that lives in.   Social Determinants of Health   Financial Resource Strain: Medium Risk (06/11/2022)   Overall Financial Resource Strain (CARDIA)    Difficulty of Paying Living Expenses: Somewhat hard  Food Insecurity: No Food Insecurity (09/07/2022)   Hunger Vital Sign    Worried About Running Out of Food in the Last Year: Never true    Ran Out of Food in the Last Year: Never true  Transportation Needs: Unmet Transportation Needs (09/07/2022)   PRAPARE - Hydrologist (Medical): Yes    Lack of Transportation (Non-Medical): No  Physical Activity: Inactive (06/11/2022)   Exercise Vital Sign    Days of Exercise per Week: 0 days    Minutes of Exercise per Session: 0 min  Stress: No Stress Concern Present (06/11/2022)   [place]  Feeling of Stress : Not at all  Social Connections: Socially Isolated (06/11/2022)   Social Connection and Isolation Panel [NHANES]    Frequency of Communication with Friends and Family: More than three times a week    Frequency of Social Gatherings with Friends and Family: More than three times a week    Attends Religious Services: Never    Marine scientist or Organizations: No    Attends Music therapist: Never    Marital Status: Divorced     Family History: The patient's family history includes Anxiety disorder in his brother, father, mother, sister, and sister; Arthritis in his mother and sister; Asthma in his mother and sister; Depression in his brother, father, mother, sister, and sister; Diabetes in his father, mother, and sister; Heart disease in his brother, father, mother, and sister; Hyperlipidemia in his brother, mother, and sister; Hypertension in his brother, mother, and sister; Kidney disease in his  mother; Lung disease in his mother and sister; Thyroid disease in his mother.  ROS:   Please see the history of present illness.     All other systems reviewed and are negative.  EKGs/Labs/Other Studies Reviewed:    The following studies were reviewed today:  Myoview Lexiscan 06/2022 Narrative & Impression      Low risk, probably normal pharmacologic myocardial perfusion stress test.   There is no evidence of significant ischemia or scar, though evaluation of the inferior wall is quite limited due to extracardiac activity and diaphragmatic attenuation.   Left ventricular systolic function is normal by visual estimation.  Calculated LVEF or 40-45% is likely artifactually low due to significant extracardiac activity.   Mild coronary artery calcification is noted on the attenuation correction CT.   Faint hazy opacity is noted in the right upper lobe adjacent to the major fissure of uncertain clinical significance.  Dedicated chest CT could be obtained for further evaluation, as clinically indicated.    Echo 06/2022  1. Left ventricular ejection fraction, by estimation, is 60 to 65%. The  left ventricle has normal function. The left ventricle has no regional  wall motion abnormalities. There is mild left ventricular hypertrophy.  Left ventricular diastolic parameters  were normal.   2. Right ventricular systolic function is normal. The right ventricular  size is normal.   3. The mitral valve is normal in structure. No evidence of mitral valve  regurgitation.   4. The aortic valve is tricuspid. Aortic valve regurgitation is not  visualized. Aortic valve sclerosis is present, with no evidence of aortic  valve stenosis.   EKG:  EKG is *** ordered today.  The ekg ordered today demonstrates ***  Recent Labs: 11/25/2021: B Natriuretic Peptide 549.9 09/06/2022: ALT 16 09/09/2022: BUN 49; Creatinine, Ser 5.47; Hemoglobin 14.1; Magnesium 2.1; Platelets 247; Potassium 2.8; Sodium 135  Recent  Lipid Panel    Component Value Date/Time   CHOL 201 (H) 06/23/2022 0807   CHOL 161 04/17/2021 1548   TRIG 83 06/23/2022 0807   HDL 47 06/23/2022 0807   HDL 24 (L) 04/17/2021 1548   CHOLHDL 4.3 06/23/2022 0807   VLDL 17 06/23/2022 0807   LDLCALC 137 (H) 06/23/2022 0807   LDLCALC 104 (H) 04/17/2021 1548     Risk Assessment/Calculations:   {Does this patient have ATRIAL FIBRILLATION?:[id number]}   Physical Exam:    VS:  There were no vitals taken for this visit.    Wt Readings from Last 3 Encounters:  10/16/22 230 lb (104.3 kg)  09/08/22  216 lb 11.4 oz (98.3 kg)  08/28/22 233 lb (105.7 kg)     GEN: *** Well nourished, well developed in no acute distress HEENT: Normal NECK: No JVD; No carotid bruits LYMPHATICS: No lymphadenopathy CARDIAC: ***RRR, no murmurs, rubs, gallops RESPIRATORY:  Clear to auscultation without rales, wheezing or rhonchi  ABDOMEN: Soft, non-tender, non-distended MUSCULOSKELETAL:  No edema; No deformity  SKIN: Warm and dry NEUROLOGIC:  Alert and oriented x 3 PSYCHIATRIC:  Normal affect   ASSESSMENT:    No diagnosis found. PLAN:    In order of problems listed above:  HFpEF SOB/LLE  HTN  Atypical chest pain  ESRD on HD Tobacco use  HLD  Disposition: Follow up {follow up:15908} with ***   Shared Decision Making/Informed Consent   {Are you ordering a CV Procedure (e.g. stress test, cath, DCCV, TEE, etc)?   Press F2        :[id number]}    Signed, Neliah Cuyler Ninfa Meeker, PA-C  10/22/2022 12:54 PM    [facility]

## 2022-11-13 ENCOUNTER — Other Ambulatory Visit (INDEPENDENT_AMBULATORY_CARE_PROVIDER_SITE_OTHER): Payer: Self-pay | Admitting: Nurse Practitioner

## 2022-11-13 DIAGNOSIS — N186 End stage renal disease: Secondary | ICD-10-CM

## 2022-11-14 ENCOUNTER — Encounter (INDEPENDENT_AMBULATORY_CARE_PROVIDER_SITE_OTHER): Payer: Medicare Other

## 2022-11-14 ENCOUNTER — Ambulatory Visit (INDEPENDENT_AMBULATORY_CARE_PROVIDER_SITE_OTHER): Payer: Medicare Other | Admitting: Nurse Practitioner

## 2022-11-20 ENCOUNTER — Ambulatory Visit: Payer: Medicare Other | Admitting: Family

## 2022-11-22 ENCOUNTER — Inpatient Hospital Stay
Admission: EM | Admit: 2022-11-22 | Discharge: 2022-11-24 | DRG: 189 | Disposition: A | Discharge status: Home / Self Care | Payer: Medicare Other | Attending: Internal Medicine | Admitting: Internal Medicine

## 2022-11-22 ENCOUNTER — Other Ambulatory Visit: Payer: Self-pay

## 2022-11-22 ENCOUNTER — Encounter: Payer: Self-pay | Admitting: Internal Medicine

## 2022-11-22 ENCOUNTER — Emergency Department: Payer: Medicare Other

## 2022-11-22 DIAGNOSIS — Z9981 Dependence on supplemental oxygen: Secondary | ICD-10-CM

## 2022-11-22 DIAGNOSIS — Z6837 Body mass index (BMI) 37.0-37.9, adult: Secondary | ICD-10-CM

## 2022-11-22 DIAGNOSIS — Z20822 Contact with and (suspected) exposure to covid-19: Secondary | ICD-10-CM | POA: Diagnosis present

## 2022-11-22 DIAGNOSIS — N186 End stage renal disease: Secondary | ICD-10-CM | POA: Diagnosis present

## 2022-11-22 DIAGNOSIS — F419 Anxiety disorder, unspecified: Secondary | ICD-10-CM | POA: Diagnosis present

## 2022-11-22 DIAGNOSIS — F1910 Other psychoactive substance abuse, uncomplicated: Secondary | ICD-10-CM | POA: Diagnosis present

## 2022-11-22 DIAGNOSIS — J439 Emphysema, unspecified: Secondary | ICD-10-CM | POA: Diagnosis present

## 2022-11-22 DIAGNOSIS — I2489 Other forms of acute ischemic heart disease: Secondary | ICD-10-CM | POA: Diagnosis present

## 2022-11-22 DIAGNOSIS — R0602 Shortness of breath: Secondary | ICD-10-CM | POA: Diagnosis not present

## 2022-11-22 DIAGNOSIS — Z8614 Personal history of Methicillin resistant Staphylococcus aureus infection: Secondary | ICD-10-CM

## 2022-11-22 DIAGNOSIS — F410 Panic disorder [episodic paroxysmal anxiety] without agoraphobia: Secondary | ICD-10-CM | POA: Diagnosis present

## 2022-11-22 DIAGNOSIS — Z8679 Personal history of other diseases of the circulatory system: Secondary | ICD-10-CM

## 2022-11-22 DIAGNOSIS — Z8249 Family history of ischemic heart disease and other diseases of the circulatory system: Secondary | ICD-10-CM

## 2022-11-22 DIAGNOSIS — Z825 Family history of asthma and other chronic lower respiratory diseases: Secondary | ICD-10-CM

## 2022-11-22 DIAGNOSIS — Z818 Family history of other mental and behavioral disorders: Secondary | ICD-10-CM

## 2022-11-22 DIAGNOSIS — E1169 Type 2 diabetes mellitus with other specified complication: Secondary | ICD-10-CM | POA: Diagnosis present

## 2022-11-22 DIAGNOSIS — R109 Unspecified abdominal pain: Secondary | ICD-10-CM | POA: Diagnosis present

## 2022-11-22 DIAGNOSIS — Z833 Family history of diabetes mellitus: Secondary | ICD-10-CM

## 2022-11-22 DIAGNOSIS — J810 Acute pulmonary edema: Principal | ICD-10-CM | POA: Diagnosis present

## 2022-11-22 DIAGNOSIS — E8770 Fluid overload, unspecified: Secondary | ICD-10-CM | POA: Diagnosis present

## 2022-11-22 DIAGNOSIS — G4733 Obstructive sleep apnea (adult) (pediatric): Secondary | ICD-10-CM | POA: Diagnosis present

## 2022-11-22 DIAGNOSIS — Z91158 Patient's noncompliance with renal dialysis for other reason: Secondary | ICD-10-CM

## 2022-11-22 DIAGNOSIS — D631 Anemia in chronic kidney disease: Secondary | ICD-10-CM | POA: Diagnosis present

## 2022-11-22 DIAGNOSIS — E785 Hyperlipidemia, unspecified: Secondary | ICD-10-CM | POA: Diagnosis present

## 2022-11-22 DIAGNOSIS — Z79899 Other long term (current) drug therapy: Secondary | ICD-10-CM

## 2022-11-22 DIAGNOSIS — I10 Essential (primary) hypertension: Secondary | ICD-10-CM | POA: Diagnosis present

## 2022-11-22 DIAGNOSIS — Z841 Family history of disorders of kidney and ureter: Secondary | ICD-10-CM

## 2022-11-22 DIAGNOSIS — E781 Pure hyperglyceridemia: Secondary | ICD-10-CM | POA: Diagnosis present

## 2022-11-22 DIAGNOSIS — Z881 Allergy status to other antibiotic agents status: Secondary | ICD-10-CM

## 2022-11-22 DIAGNOSIS — R0600 Dyspnea, unspecified: Secondary | ICD-10-CM

## 2022-11-22 DIAGNOSIS — F32A Depression, unspecified: Secondary | ICD-10-CM | POA: Diagnosis present

## 2022-11-22 DIAGNOSIS — Z888 Allergy status to other drugs, medicaments and biological substances status: Secondary | ICD-10-CM

## 2022-11-22 DIAGNOSIS — I132 Hypertensive heart and chronic kidney disease with heart failure and with stage 5 chronic kidney disease, or end stage renal disease: Secondary | ICD-10-CM | POA: Diagnosis present

## 2022-11-22 DIAGNOSIS — F418 Other specified anxiety disorders: Secondary | ICD-10-CM | POA: Diagnosis present

## 2022-11-22 DIAGNOSIS — E1159 Type 2 diabetes mellitus with other circulatory complications: Secondary | ICD-10-CM | POA: Diagnosis present

## 2022-11-22 DIAGNOSIS — Z7982 Long term (current) use of aspirin: Secondary | ICD-10-CM

## 2022-11-22 DIAGNOSIS — R7989 Other specified abnormal findings of blood chemistry: Secondary | ICD-10-CM | POA: Insufficient documentation

## 2022-11-22 DIAGNOSIS — Z8673 Personal history of transient ischemic attack (TIA), and cerebral infarction without residual deficits: Secondary | ICD-10-CM

## 2022-11-22 DIAGNOSIS — I5032 Chronic diastolic (congestive) heart failure: Secondary | ICD-10-CM | POA: Diagnosis present

## 2022-11-22 DIAGNOSIS — Z91148 Patient's other noncompliance with medication regimen for other reason: Secondary | ICD-10-CM | POA: Diagnosis present

## 2022-11-22 DIAGNOSIS — F17210 Nicotine dependence, cigarettes, uncomplicated: Secondary | ICD-10-CM | POA: Diagnosis present

## 2022-11-22 DIAGNOSIS — G8929 Other chronic pain: Secondary | ICD-10-CM | POA: Diagnosis present

## 2022-11-22 DIAGNOSIS — Z789 Other specified health status: Secondary | ICD-10-CM | POA: Diagnosis present

## 2022-11-22 DIAGNOSIS — Z992 Dependence on renal dialysis: Secondary | ICD-10-CM

## 2022-11-22 DIAGNOSIS — Z720 Tobacco use: Secondary | ICD-10-CM | POA: Diagnosis present

## 2022-11-22 DIAGNOSIS — I152 Hypertension secondary to endocrine disorders: Secondary | ICD-10-CM | POA: Diagnosis present

## 2022-11-22 DIAGNOSIS — M75102 Unspecified rotator cuff tear or rupture of left shoulder, not specified as traumatic: Secondary | ICD-10-CM | POA: Diagnosis present

## 2022-11-22 DIAGNOSIS — G3184 Mild cognitive impairment, so stated: Secondary | ICD-10-CM | POA: Diagnosis present

## 2022-11-22 DIAGNOSIS — N529 Male erectile dysfunction, unspecified: Secondary | ICD-10-CM | POA: Diagnosis present

## 2022-11-22 DIAGNOSIS — E1122 Type 2 diabetes mellitus with diabetic chronic kidney disease: Secondary | ICD-10-CM | POA: Diagnosis present

## 2022-11-22 DIAGNOSIS — J9611 Chronic respiratory failure with hypoxia: Secondary | ICD-10-CM | POA: Diagnosis present

## 2022-11-22 DIAGNOSIS — Z885 Allergy status to narcotic agent status: Secondary | ICD-10-CM

## 2022-11-22 DIAGNOSIS — K219 Gastro-esophageal reflux disease without esophagitis: Secondary | ICD-10-CM | POA: Diagnosis present

## 2022-11-22 DIAGNOSIS — E1129 Type 2 diabetes mellitus with other diabetic kidney complication: Secondary | ICD-10-CM | POA: Diagnosis present

## 2022-11-22 LAB — CBG MONITORING, ED: Glucose-Capillary: 124 mg/dL — ABNORMAL HIGH (ref 70–99)

## 2022-11-22 LAB — COMPREHENSIVE METABOLIC PANEL
ALT: 18 U/L (ref 0–44)
AST: 17 U/L (ref 15–41)
Albumin: 3.4 g/dL — ABNORMAL LOW (ref 3.5–5.0)
Alkaline Phosphatase: 108 U/L (ref 38–126)
Anion gap: 3 — ABNORMAL LOW (ref 5–15)
BUN: 54 mg/dL — ABNORMAL HIGH (ref 6–20)
CO2: 22 mmol/L (ref 22–32)
Calcium: 8.5 mg/dL — ABNORMAL LOW (ref 8.9–10.3)
Chloride: 111 mmol/L (ref 98–111)
Creatinine, Ser: 5.38 mg/dL — ABNORMAL HIGH (ref 0.61–1.24)
GFR, Estimated: 12 mL/min — ABNORMAL LOW (ref >60)
Glucose, Bld: 139 mg/dL — ABNORMAL HIGH (ref 70–99)
Potassium: 4.9 mmol/L (ref 3.5–5.1)
Sodium: 136 mmol/L (ref 135–145)
Total Bilirubin: 1.3 mg/dL — ABNORMAL HIGH (ref 0.3–1.2)
Total Protein: 6.9 g/dL (ref 6.5–8.1)

## 2022-11-22 LAB — CBC WITH DIFFERENTIAL/PLATELET
Abs Immature Granulocytes: 0.07 10*3/uL (ref 0.00–0.07)
Basophils Absolute: 0.1 10*3/uL (ref 0.0–0.1)
Basophils Relative: 0 %
Eosinophils Absolute: 0.3 10*3/uL (ref 0.0–0.5)
Eosinophils Relative: 2 %
HCT: 35.8 % — ABNORMAL LOW (ref 39.0–52.0)
Hemoglobin: 11.6 g/dL — ABNORMAL LOW (ref 13.0–17.0)
Immature Granulocytes: 1 %
Lymphocytes Relative: 10 %
Lymphs Abs: 1.2 10*3/uL (ref 0.7–4.0)
MCH: 30.1 pg (ref 26.0–34.0)
MCHC: 32.4 g/dL (ref 30.0–36.0)
MCV: 93 fL (ref 80.0–100.0)
Monocytes Absolute: 0.6 10*3/uL (ref 0.1–1.0)
Monocytes Relative: 5 %
Neutro Abs: 10.3 10*3/uL — ABNORMAL HIGH (ref 1.7–7.7)
Neutrophils Relative %: 82 %
Platelets: 217 10*3/uL (ref 150–400)
RBC: 3.85 MIL/uL — ABNORMAL LOW (ref 4.22–5.81)
RDW: 13.4 % (ref 11.5–15.5)
WBC: 12.4 10*3/uL — ABNORMAL HIGH (ref 4.0–10.5)
nRBC: 0 % (ref 0.0–0.2)

## 2022-11-22 LAB — RESP PANEL BY RT-PCR (RSV, FLU A&B, COVID)  RVPGX2
Influenza A by PCR: NEGATIVE
Influenza B by PCR: NEGATIVE
Resp Syncytial Virus by PCR: NEGATIVE
SARS Coronavirus 2 by RT PCR: NEGATIVE

## 2022-11-22 LAB — TROPONIN I (HIGH SENSITIVITY)
Troponin I (High Sensitivity): 149 ng/L (ref <18)
Troponin I (High Sensitivity): 124 ng/L (ref <18)

## 2022-11-22 LAB — BRAIN NATRIURETIC PEPTIDE: B Natriuretic Peptide: 625.3 pg/mL — ABNORMAL HIGH (ref 0.0–100.0)

## 2022-11-22 LAB — PHOSPHORUS: Phosphorus: 4.7 mg/dL — ABNORMAL HIGH (ref 2.5–4.6)

## 2022-11-22 LAB — MAGNESIUM: Magnesium: 2.1 mg/dL (ref 1.7–2.4)

## 2022-11-22 MED ORDER — IPRATROPIUM-ALBUTEROL 0.5-2.5 (3) MG/3ML IN SOLN
3.0000 mL | Freq: Once | RESPIRATORY_TRACT | Status: AC
  Administered 2022-11-22: 3 mL via RESPIRATORY_TRACT
  Filled 2022-11-22: qty 3

## 2022-11-22 MED ORDER — ONDANSETRON HCL 4 MG/2ML IJ SOLN: 4.0000 mg | Freq: Four times a day (QID) | INTRAMUSCULAR | Status: DC | PRN

## 2022-11-22 MED ORDER — LIDOCAINE HCL (PF) 1 % IJ SOLN: 5.0000 mL | INTRAMUSCULAR | Status: DC | PRN

## 2022-11-22 MED ORDER — CARVEDILOL 25 MG PO TABS
25.0000 mg | ORAL_TABLET | Freq: Two times a day (BID) | ORAL | Status: DC
  Administered 2022-11-23 (×2): 25 mg via ORAL
  Filled 2022-11-22 (×2): qty 1

## 2022-11-22 MED ORDER — ACETAMINOPHEN 325 MG PO TABS
650.0000 mg | ORAL_TABLET | Freq: Four times a day (QID) | ORAL | Status: DC | PRN
  Administered 2022-11-23: 650 mg via ORAL
  Filled 2022-11-22: qty 2

## 2022-11-22 MED ORDER — ASPIRIN 81 MG PO CHEW
324.0000 mg | CHEWABLE_TABLET | Freq: Once | ORAL | Status: AC
  Administered 2022-11-22: 324 mg via ORAL
  Filled 2022-11-22: qty 4

## 2022-11-22 MED ORDER — NICOTINE 21 MG/24HR TD PT24: 21.0000 mg | MEDICATED_PATCH | Freq: Every day | TRANSDERMAL | Status: DC | PRN

## 2022-11-22 MED ORDER — PENTAFLUOROPROP-TETRAFLUOROETH EX AERO: 1.0000 | INHALATION_SPRAY | CUTANEOUS | Status: DC | PRN

## 2022-11-22 MED ORDER — SENNOSIDES-DOCUSATE SODIUM 8.6-50 MG PO TABS: 1.0000 | ORAL_TABLET | Freq: Every evening | ORAL | Status: DC | PRN

## 2022-11-22 MED ORDER — HEPARIN SODIUM (PORCINE) 5000 UNIT/ML IJ SOLN
5000.0000 [IU] | Freq: Three times a day (TID) | INTRAMUSCULAR | Status: DC
  Administered 2022-11-22 – 2022-11-24 (×6): 5000 [IU] via SUBCUTANEOUS
  Filled 2022-11-22 (×6): qty 1

## 2022-11-22 MED ORDER — INSULIN ASPART 100 UNIT/ML IJ SOLN: 0.0000 [IU] | Freq: Every day | INTRAMUSCULAR | Status: DC

## 2022-11-22 MED ORDER — FUROSEMIDE 10 MG/ML IJ SOLN
80.0000 mg | Freq: Once | INTRAMUSCULAR | Status: AC
  Administered 2022-11-22: 80 mg via INTRAVENOUS
  Filled 2022-11-22: qty 8

## 2022-11-22 MED ORDER — ANTICOAGULANT SODIUM CITRATE 4% (200MG/5ML) IV SOLN: 5.0000 mL | Status: DC | PRN

## 2022-11-22 MED ORDER — CHLORHEXIDINE GLUCONATE CLOTH 2 % EX PADS
6.0000 | MEDICATED_PAD | Freq: Every day | CUTANEOUS | Status: DC
  Administered 2022-11-23 – 2022-11-24 (×2): 6 via TOPICAL
  Filled 2022-11-22: qty 6

## 2022-11-22 MED ORDER — LIDOCAINE-PRILOCAINE 2.5-2.5 % EX CREA: 1.0000 | TOPICAL_CREAM | CUTANEOUS | Status: DC | PRN

## 2022-11-22 MED ORDER — AMLODIPINE BESYLATE 10 MG PO TABS
10.0000 mg | ORAL_TABLET | Freq: Every day | ORAL | Status: DC
  Administered 2022-11-23: 10 mg via ORAL
  Filled 2022-11-22: qty 1

## 2022-11-22 MED ORDER — INSULIN ASPART 100 UNIT/ML IJ SOLN
0.0000 [IU] | Freq: Three times a day (TID) | INTRAMUSCULAR | Status: DC
  Administered 2022-11-23: 1 [IU] via SUBCUTANEOUS
  Filled 2022-11-22: qty 1

## 2022-11-22 MED ORDER — LOSARTAN POTASSIUM 50 MG PO TABS
100.0000 mg | ORAL_TABLET | Freq: Every day | ORAL | Status: DC
  Administered 2022-11-23: 100 mg via ORAL
  Filled 2022-11-22: qty 2

## 2022-11-22 MED ORDER — ALTEPLASE 2 MG IJ SOLR: 2.0000 mg | Freq: Once | INTRAMUSCULAR | Status: DC | PRN

## 2022-11-22 MED ORDER — HEPARIN SODIUM (PORCINE) 1000 UNIT/ML DIALYSIS: 1000.0000 [IU] | INTRAMUSCULAR | Status: DC | PRN

## 2022-11-22 MED ORDER — ONDANSETRON HCL 4 MG PO TABS: 4.0000 mg | ORAL_TABLET | Freq: Four times a day (QID) | ORAL | Status: DC | PRN

## 2022-11-22 MED ORDER — ACETAMINOPHEN 650 MG RE SUPP: 650.0000 mg | Freq: Four times a day (QID) | RECTAL | Status: DC | PRN

## 2022-11-22 NOTE — Assessment & Plan Note (Signed)
-   Patient was started on hemodialysis in July 2023 - His sessions are [facility] and per patient his last session was on Saturday 11/17/2022 - Nephrology has been consulted

## 2022-11-22 NOTE — ED Notes (Signed)
Called lab for blood draw. Multiple IV attempts via ultrasound unsuccessful.

## 2022-11-22 NOTE — Assessment & Plan Note (Signed)
-   Per med reconciliation patient has not been taking his home medications as appropriate - Amlodipine 10 mg daily, carvedilol 25 mg p.o. twice daily, losartan 100 mg daily

## 2022-11-22 NOTE — Assessment & Plan Note (Signed)
-   Patient is in remission

## 2022-11-22 NOTE — H&P (Addendum)
History and Physical   Derek S Stickley Jr. IRS:[id number] DOB: 08/20/1966 DOA: 11/22/2022  PCP: Jon Billings, NP  Outpatient Specialists: Dr. Lucky Cowboy Patient coming from: home via EMS  I have personally briefly reviewed patient's old medical records in [place].  Chief Concern: shortness of breath  HPI: [name] is a 56 year old male with history of COPD, end-stage renal disease on hemodialysis TU TH SA, on chronic 3 L nasal cannula, hypertension, obstructive sleep apnea, morbid obesity, acquired mild cognitive impairment presumed secondary to history of stroke, who presents emergency department for chief concerns of shortness of breath.  Initial vitals in the emergency department showed temperature of 98, respiration rate of 25, heart rate of 82, blood pressure 142/80, SpO2 of 95% on 2 L nasal cannula.  Serum sodium is 136, potassium 4.9, chloride of 111, bicarb 22, BUN of 54, serum creatinine of 5.38, EGFR 12, nonfasting blood glucose 139, magnesium level is 2.1, phosphorus was elevated at 4.7, high sensitive troponin was 149, BNP was elevated at 625.3.    COVID/influenza A/influenza B/RSV PCR were negative.  EDP discussed with nephrology service, Dr. Holley Raring who states the patient will get dialysis on night of admission.  ED treatment: Aspirin 324 mg p.o. one-time dose, furosemide 80 mg IV one-time dose, DuoNebs one-time dose. ------------------------------- At bedside, he is able to tell me his name, age, current location.  He has been short of breath for 2-3 days. He denies fever, nausea, vomiting, chest pain. He denies dysuria, hematuria. He denies.  He endorses nonproductive cough.  He reports he could not walk for more than 4 feet without feeling short of breath.  This is why he did not come to his dialysis sessions.    He did not understand that the shortness of breath was related to fluid overload as he needed to get his dialysis.  Social history: He lives  with a roommate and his room make takes him to HD. He smokes 1/2 ppd. He denies etoh and recreational drug use.  ROS: Constitutional: no weight change, no fever ENT/Mouth: no sore throat, no rhinorrhea Eyes: no eye pain, no vision changes Cardiovascular: no chest pain, + dyspnea,  no edema, no palpitations Respiratory: + cough, no sputum, no wheezing Gastrointestinal: no nausea, no vomiting, no diarrhea, no constipation Genitourinary: no urinary incontinence, no dysuria, no hematuria Musculoskeletal: no arthralgias, no myalgias Skin: no skin lesions, no pruritus, Neuro: + weakness, no loss of consciousness, no syncope Psych: no anxiety, no depression, + decrease appetite Heme/Lymph: no bruising, no bleeding  ED Course: Discussed with emergency medicine provider, patient requiring hospitalization for chief concerns of volume overload in setting of missing dialysis.  Assessment/Plan  Principal Problem:   Shortness of breath Active Problems:   HLD (hyperlipidemia)   Tobacco abuse   Type II diabetes mellitus with renal manifestations (HCC)   Depression with anxiety   Hypertension associated with diabetes (HCC)   Morbid obesity (HCC)   OSA (obstructive sleep apnea)   Statin intolerance   Hypertriglyceridemia   Nonadherence to medication   Mild cognitive impairment   ESRD on hemodialysis (HCC)   Polysubstance abuse (HCC)   Elevated troponin   Assessment and Plan:  * Shortness of breath - Presumed secondary to interstitial edema consistent with missing dialysis - Status post furosemide 80 mg IV per EDP - Appreciate nephrology consultation for dialysis - Admit to telemetry medical, observation  Tobacco abuse - PRN nicotine patch ordered  Type II diabetes mellitus with renal manifestations (  [place]) - Insulin SSI with at bedtime coverage ordered  Depression with anxiety - Patient is not taking his duloxetine  Elevated troponin - Presumed secondary to demand ischemia and it  is downtrending - Will monitor telemetry  Polysubstance abuse ([place]) - Patient is in remission  ESRD on hemodialysis [facility]) - Patient was started on hemodialysis in July 2023 - His sessions are [facility]U TH SA and per patient his last session was on Saturday 11/17/2022 - Nephrology has been consulted  Mild cognitive impairment Appears to be acquired secondary to stroke - This is likely contributing to patient's not adhering to his medications and dialysis treatment sessions  Morbid obesity ([place]) - This meets criteria for morbid obesity based on the presence of 1 or more chronic comorbidities. Patient has hypertension and BMI of 37.2. This complicates overall care and prognosis.  Hypertension associated with diabetes ([place]) - Per med reconciliation patient has not been taking his home medications as appropriate - Amlodipine 10 mg daily, carvedilol 25 mg p.o. twice daily, losartan 100 mg daily  Chart reviewed.   DVT prophylaxis: Heparin Code Status: Full code Diet: Renal/carb modified Family Communication: No Disposition Plan: Pending clinical course Consults called: Nephrology Admission status: Telemetry medical, observation  Past Medical History:  Diagnosis Date   Abdominal abscess    a.) chronic MRSA infection   Acute pancreatitis    Anxiety    Asthma    Brain aneurysm    a.) 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 b.) RIGHT crainotomy for clip ligation 05/14/2019.   CHF (congestive heart failure) (HCC)    Chronic back pain    Chronic heart failure with preserved ejection fraction (HFpEF) ([place])    a. 11/2021 Echo: EF 55-60%, no rwma, mod LVH, GrI DD. Nl RV size/fxn. Mildly dil LA.   CKD (chronic kidney disease), stage V (HCC)    Depression    Emphysema of lung (HCC)    Erectile dysfunction    Followed by palliative care service    GERD (gastroesophageal reflux disease)    History of methicillin resistant  staphylococcus aureus (MRSA)    HLD (hyperlipidemia)    Hypertension    Mild cognitive impairment    MRSA (methicillin resistant Staphylococcus aureus)    Obesity    OSA (obstructive sleep apnea) 2013   a.) not compliant with nocturnal PAP therapy; CPAP machine "lost or stolen".   Panic disorder    Perforated bowel ([place]) 12/10/2005   Tempoary Colostomy Bag, Skin Graft for Abd wound   Polysubstance abuse ([place])    a.) ETOH, tobacco, marijuana, methamphetamines, cocaine, BZO, opioids.   Subarachnoid hemorrhage ([place]) 01/26/2010   a.) 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   T2DM (type 2 diabetes mellitus) ([place])    Tobacco abuse    Type 2 diabetes mellitus without complication, without long-term current use of insulin ([place]) 04/17/2016   Vitreous hemorrhage ([place]) 03/27/2011   Overview:  Bilateral; 01/2010, from [facility]    Past Surgical History:  Procedure Laterality Date   A/V FISTULAGRAM Left 07/16/2022   Procedure: A/V Fistulagram;  Surgeon: Algernon Huxley, MD;  Location: [place] [facility];  Service: Cardiovascular;  Laterality: Left;   AV FISTULA PLACEMENT Left 05/24/2022   Procedure: ARTERIOVENOUS (AV) FISTULA CREATION ( RADIAL CEPHALIC);  Surgeon: Algernon Huxley, MD;  Location: [facility];  Service: Vascular;  Laterality: Left;   CEREBRAL ANEURYSM REPAIR  Right 01/26/2010   Procedure: CEREBRAL ANEURYSM REPAIR (COIL EMBOLIZATION)   CEREBRAL ANEURYSM REPAIR Right 05/14/2019   Procedure: CRAINOTOMY FOR CEREBRAL ANEURYSM REPAIR (CLIP LIGATION)   COLON SURGERY  12/10/2005   colostomy bag placed s/p perforated bowel   COLONOSCOPY WITH PROPOFOL N/A 05/18/2020   Procedure: COLONOSCOPY WITH PROPOFOL;  Surgeon: Lin Landsman, MD;  Location: [place];  Service: Gastroenterology;  Laterality: N/A;   DIALYSIS/PERMA CATHETER INSERTION N/A 06/27/2022   Procedure: DIALYSIS/PERMA CATHETER INSERTION;  Surgeon: Katha Cabal, MD;  Location: [place] [facility];  Service: Cardiovascular;  Laterality: N/A;   KNEE SURGERY Right    Perforated bowel     Social History:  reports that he has been smoking cigarettes. He started smoking about 38 years ago. He has been smoking an average of .5 packs per day. He has never used smokeless tobacco. He reports current alcohol use. He reports that he does not currently use drugs after having used the following drugs: "Crack" cocaine, Amphetamines, Methamphetamines, Benzodiazepines, Cocaine, Marijuana, and Other-see comments.  Allergies  Allergen Reactions   Atorvastatin     Joint Aches - Severe Joint Aches - Severe Joint Aches - Severe   Avelox [Moxifloxacin Hcl In [place]]     Muscle pain   Buprenorphine     Mouth sores, confusion, shaking   Dilaudid [Hydromorphone Hcl] Hives   Fluoxetine Itching   Levofloxacin Other (See Comments)    Joint Pain   Morphine And Related    Other     Muscle pain   Suboxone [Buprenorphine Hcl-Naloxone Hcl] Other (See Comments)    Rash and confused   Vancomycin     Renal insufficiency   Family History  Problem Relation Age of Onset   Arthritis Mother    Asthma Mother    Diabetes Mother    Heart disease Mother    Hyperlipidemia Mother    Hypertension Mother    Kidney disease Mother    Thyroid disease Mother    Lung disease Mother    Anxiety disorder Mother    Depression Mother    Diabetes Father    Heart disease Father    Depression Father    Anxiety disorder Father    Arthritis Sister    Asthma Sister    Hyperlipidemia Sister    Hypertension Sister    Lung disease Sister    Anxiety disorder Sister    Depression Sister    Hyperlipidemia Brother    Hypertension Brother    Diabetes Sister    Heart disease Sister    Depression Sister    Anxiety disorder Sister    Anxiety disorder Brother    Depression Brother    Heart disease Brother    Family history: Family history reviewed and not pertinent.  Prior to Admission  medications   Medication Sig Start Date End Date Taking? Authorizing Provider  lidocaine-prilocaine (EMLA) cream Apply 1 Application topically daily. 08/23/22  Yes [provider]  losartan (COZAAR) 100 MG tablet Take 100 mg by mouth daily. 10/25/22  Yes [provider]  RENVELA 800 MG tablet Take 1,600 mg by mouth 3 (three) times daily. 09/20/22  Yes [provider]  albuterol (VENTOLIN HFA) 108 (90 Base) MCG/ACT inhaler Inhale 1-2 puffs into the lungs every 6 (six) hours as needed for wheezing or shortness of breath. 07/02/22   Loletha Grayer, MD  amLODipine (NORVASC) 10 MG tablet TAKE 1 TABLET(10 MG) BY MOUTH DAILY 08/03/22   Jon Billings, NP  aspirin  EC 81 MG tablet Take 1 tablet (81 mg total) by mouth daily. Swallow whole. 06/26/22   Sharen Hones, MD  carvedilol (COREG) 25 MG tablet TAKE 1 TABLET(25 MG) BY MOUTH TWICE DAILY WITH A MEAL 08/03/22   Jon Billings, NP  DULoxetine (CYMBALTA) 60 MG capsule Take 1 capsule (60 mg total) by mouth daily. 08/03/22 09/02/22  Jon Billings, NP  glucose blood [facility] ULTRA) test strip USE TO TEST BLOOD SUGAR DAILY 08/23/20   Noemi Chapel A, NP  hydrALAZINE (APRESOLINE) 100 MG tablet Take 100 mg by mouth 2 (two) times daily. 09/01/22   [provider]  hydrOXYzine (ATARAX) 50 MG tablet TAKE ONE TABLET 3 TIMES DAILY AS NEEDED 08/03/22   Jon Billings, NP  multivitamin (RENA-VIT) TABS tablet Take 1 tablet by mouth daily. 07/20/22   [provider]  OXYGEN Inhale 3 L into the lungs daily.    [provider]  pantoprazole (PROTONIX) 40 MG tablet TAKE 1 TABLET(40 MG) BY MOUTH TWICE DAILY AS NEEDED 08/03/22   Jon Billings, NP  pregabalin (LYRICA) 50 MG capsule Take 1 capsule (50 mg total) by mouth daily. 08/03/22   Jon Billings, NP  topiramate (TOPAMAX) 50 MG tablet TAKE 1 TABLET(50 MG) BY MOUTH TWICE DAILY 08/03/22   Jon Billings, NP  torsemide (DEMADEX) 20 MG tablet TAKE 2  TABLETS(40 MG) BY MOUTH DAILY 08/03/22   Jon Billings, NP   Physical Exam: Vitals:   11/22/22 1700 11/22/22 1708 11/22/22 1730 11/22/22 1830  BP: (!) 158/88  (!) 135/118 110/86  Pulse: (!) 40  77 75  Resp: (!) 25  (!) 25 17  Temp:  97.7 F (36.5 C)    TempSrc:  Oral    SpO2: 100%  99% 95%  Weight:      Height:       Constitutional: appears older than chronological age, frail, chronically ill, NAD, calm, comfortable.  Eyes: PERRL, lids and conjunctivae normal ENMT: Mucous membranes are moist. Posterior pharynx clear of any exudate or lesions. Poor dentition. Hearing appropriate Neck: normal, supple, no masses, no thyromegaly Respiratory: Generalized decreased lung sounds bilaterally, no wheezing, no crackles. Normal respiratory effort. No accessory muscle use.  Cardiovascular: Regular rate and rhythm, no murmurs / rubs / gallops. No extremity edema. 2+ pedal pulses. No carotid bruits.  Abdomen: Morbidly obese abdomen, no tenderness, no masses palpated, no hepatosplenomegaly. Bowel sounds positive.  Musculoskeletal: no clubbing / cyanosis. No joint deformity upper and lower extremities. Good ROM, no contractures, no atrophy. Normal muscle tone.  Skin: no rashes, lesions, ulcers. No induration Neurologic: Sensation intact. Strength 5/5 in all 4.  Psychiatric: Lacks judgment and insight. Alert and oriented x 3.  Depressed mood.   EKG: independently reviewed, showing sinus rhythm with rate of 79, QTc 444  Chest x-ray on Admission: I personally reviewed and I agree with radiologist reading as below.  DG Chest Portable 1 View  Result Date: 11/22/2022 CLINICAL DATA:  Short of breath for 3 days.  Progressive dyspnea EXAM: PORTABLE CHEST 1 VIEW COMPARISON:  09/06/2022 FINDINGS: Normal cardiac silhouette with large-bore central venous line. Interval increase in interstitial edema pattern. No focal consolidation. No pneumothorax. IMPRESSION: Increasing interstitial edema. Electronically  Signed   By: Suzy Bouchard M.D.   On: 11/22/2022 14:20    Labs on Admission: I have personally reviewed following labs  CBC: Recent Labs  Lab 11/22/22 1320  WBC 12.4*  NEUTROABS 10.3*  HGB 11.6*  HCT 35.8*  MCV 93.0  PLT 217  Basic Metabolic Panel: Recent Labs  Lab 11/22/22 1320  NA 136  K 4.9  CL 111  CO2 22  GLUCOSE 139*  BUN 54*  CREATININE 5.38*  CALCIUM 8.5*  MG 2.1  PHOS 4.7*   GFR: Estimated Creatinine Clearance: 18.3 mL/min (A) (by C-G formula based on SCr of 5.38 mg/dL (H)).  Liver Function Tests: Recent Labs  Lab 11/22/22 1320  AST 17  ALT 18  ALKPHOS 108  BILITOT 1.3*  PROT 6.9  ALBUMIN 3.4*   Urine analysis:    Component Value Date/Time   COLORURINE YELLOW (A) 09/06/2022 2342   APPEARANCEUR HAZY (A) 09/06/2022 2342   APPEARANCEUR Clear 04/17/2021 1544   LABSPEC 1.010 09/06/2022 2342   LABSPEC 1.029 06/26/2012 1231   PHURINE 5.0 09/06/2022 2342   GLUCOSEU NEGATIVE 09/06/2022 2342   GLUCOSEU Negative 06/26/2012 1231   HGBUR NEGATIVE 09/06/2022 2342   BILIRUBINUR NEGATIVE 09/06/2022 2342   BILIRUBINUR Negative 04/17/2021 1544   BILIRUBINUR Negative 06/26/2012 [place] 09/06/2022 2342   PROTEINUR 100 (A) 09/06/2022 2342   NITRITE NEGATIVE 09/06/2022 2342   LEUKOCYTESUR NEGATIVE 09/06/2022 2342   LEUKOCYTESUR Negative 06/26/2012 1231   This document was prepared using Dragon Voice Recognition software and may include unintentional dictation errors.  Dr. Tobie Poet [facility]  If 7PM-7AM, please contact overnight-coverage provider If 7AM-7PM, please contact day coverage provider [email or web address]  11/22/2022, 7:56 PM

## 2022-11-22 NOTE — Assessment & Plan Note (Signed)
-   This meets criteria for morbid obesity based on the presence of 1 or more chronic comorbidities. Patient has hypertension and BMI of 37.2. This complicates overall care and prognosis.

## 2022-11-22 NOTE — Assessment & Plan Note (Addendum)
-   PRN nicotine patch ordered 

## 2022-11-22 NOTE — ED Notes (Signed)
Attempted for 20g [place] IV at R anterior mid fa. Primary RN Merleen Nicely notified.

## 2022-11-22 NOTE — ED Provider Notes (Signed)
-----------------------------------------   3:26 PM on 11/22/2022 ----------------------------------------- Patient care assumed from Dr. Darden Dates.  Current plan: We are awaiting lab work results patient is hemodialysis dependent and has essentially not had dialysis since this past Thursday 1 week ago.  Patient has increased work of breathing, chest x-ray consistent with pulmonary edema.  Patient will likely need dialysis.  Once his labs have resulted we will discuss with nephrology for plan as far as admission and dialysis or dialysis and discharge.  Patient does wear 3 L nasal cannula at home at baseline.  Currently satting in the mid 90s on 3 L. CBC shows no significant findings slight leukocytosis 12,400  Lab work has resulted showing potassium of 4.9, BUN elevated to 54.  CBC overall reassuring, troponin is quite elevated at 149, baseline troponin seems to be closer to 20.  No chest pain currently but the patient does have shortness of breath.  We will continue to monitor on his repeat troponin.  COVID flu RSV are negative.  I spoke to Dr. Holley Raring of nephrology who agrees with plan to admit to the hospitalist service he will arrange for dialysis hopefully later today.  Patient will be admitted to the hospital service for further workup and treatment.   Harvest Dark, MD 11/22/22 [phone]

## 2022-11-22 NOTE — ED Notes (Signed)
Critical result- Trop 149, MD notified

## 2022-11-22 NOTE — Assessment & Plan Note (Addendum)
-   Patient is not taking his duloxetine

## 2022-11-22 NOTE — ED Triage Notes (Signed)
BIBA for SOB that started Tuesday and has became progressively worse. Pt missed two days of dialysis this week due to feeling SOB. Last dialysis was 12/9. History of COPD.  He is on 3L [place] at home baseline.

## 2022-11-22 NOTE — Assessment & Plan Note (Addendum)
-   Insulin SSI with at bedtime coverage ordered °

## 2022-11-22 NOTE — ED Provider Notes (Signed)
[facility] Provider Note    Event Date/Time   First MD Initiated Contact with Patient 11/22/22 1308     (approximate)   History   Shortness of Breath   HPI  Derek Cooley. is a 56 y.o. male  with pmh ESRD started on hemodialysis in July 2023, COPD on home oxygen at 3 L at baseline, DM, hypertension, OSA on CPAP, anemia of chronic kidney disease, depression with anxiety, tobacco use disorder, polysubstance abuse, HFp EF who presents with shortness of breath.  Patient says he has been short of breath for several days.  Gets dialyzed Tuesday Thursday Saturday.  Last dialysis was on Saturday.  He has had cough that is nonproductive occasionally having some pain from the left shoulder radiating to his chest but he has known rotator cuff tear and this is chronic for him.  Also with chronic abdominal pain but nothing new denies nausea vomiting or diarrhea.  Does still make urine.  Thinks he has some lower extremity swelling but is not sure.  No fevers.  No sick contacts.     Past Medical History:  Diagnosis Date   Abdominal abscess    a.) chronic MRSA infection   Acute pancreatitis    Anxiety    Asthma    Brain aneurysm    a.) 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 b.) RIGHT crainotomy for clip ligation 05/14/2019.   CHF (congestive heart failure) (HCC)    Chronic back pain    Chronic heart failure with preserved ejection fraction (HFpEF) ([place])    a. 11/2021 Echo: EF 55-60%, no rwma, mod LVH, GrI DD. Nl RV size/fxn. Mildly dil LA.   CKD (chronic kidney disease), stage V (HCC)    Depression    Emphysema of lung (HCC)    Erectile dysfunction    Followed by palliative care service    GERD (gastroesophageal reflux disease)    History of methicillin resistant staphylococcus aureus (MRSA)    HLD (hyperlipidemia)    Hypertension    Mild cognitive impairment    MRSA (methicillin  resistant Staphylococcus aureus)    Obesity    OSA (obstructive sleep apnea) 2013   a.) not compliant with nocturnal PAP therapy; CPAP machine "lost or stolen".   Panic disorder    Perforated bowel ([place]) 12/10/2005   Tempoary Colostomy Bag, Skin Graft for Abd wound   Polysubstance abuse ([place])    a.) ETOH, tobacco, marijuana, methamphetamines, cocaine, BZO, opioids.   Subarachnoid hemorrhage ([place]) 01/26/2010   a.) 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   T2DM (type 2 diabetes mellitus) ([place])    Tobacco abuse    Type 2 diabetes mellitus without complication, without long-term current use of insulin ([place]) 04/17/2016   Vitreous hemorrhage ([place]) 03/27/2011   Overview:  Bilateral; 01/2010, from [facility]     Patient Active Problem List   Diagnosis Date Noted   ESRD on hemodialysis ([place]) 09/07/2022   Acute on chronic respiratory failure ([place]) 09/07/2022   Polysubstance abuse ([place])    Acute metabolic encephalopathy    Fall at home, initial encounter    Acute pain of right knee and left shoulder secondary to fall    Low back pain 06/29/2022   Acute on chronic respiratory failure with hypoxia and hypercapnia ([place]) 06/23/2022   HLD (hyperlipidemia)    Type II diabetes mellitus with renal manifestations (  [place])    Depression with anxiety    Anemia in CKD (chronic kidney disease)    Chronic heart failure with preserved ejection fraction (HFpEF) ([place])    CKD (chronic kidney disease), stage V ([place]) 05/16/2022   Acute on chronic diastolic CHF (congestive heart failure) ([place]) 12/07/2021   Demand ischemia    Malignant hypertension 11/25/2021   Flash pulmonary edema ([place]) 11/25/2021   Cocaine abuse ([place]) 11/25/2021   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   Nonadherence to medication 07/06/2020   Encounter for screening colonoscopy    Altered mental status 08/16/2019   Hypertriglyceridemia 07/06/2019   Statin intolerance 01/02/2019    Acute bronchitis due to other specified organisms 12/01/2018   Hx of aneurysm 10/16/2017   Chronic obstructive pulmonary disease (COPD) ([place]) 04/17/2016   GERD (gastroesophageal reflux disease) 08/25/2015   High blood cholesterol 05/16/2015   Chronic anxiety 05/16/2015   Persistent headaches 05/16/2015   Tobacco abuse 05/16/2015   Abdominal wall abscess 02/01/2014   Infection and inflammatory reaction due to other internal prosthetic devices, implants and grafts, initial encounter ([place]) 01/05/2014   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[place]) 01/05/2014   Wound drainage 07/13/2013   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/2013   OSA (obstructive sleep apnea) 06/23/2013   Cerebral aneurysm 06/23/2013   Mild cognitive disorder 03/27/2011   Mild cognitive impairment 03/27/2011   Vitreous hemorrhage ([place]) 03/27/2011   Hypertension associated with diabetes ([place]) 03/06/2011   Adiposity 03/06/2011     Physical Exam  Triage Vital Signs: ED Triage Vitals  Enc Vitals Group     BP --      Pulse --      Resp --      Temp --      Temp src --      SpO2 11/22/22 1311 100 %     Weight 11/22/22 1316 241 lb 6.5 oz (109.5 kg)     Height 11/22/22 1316 5' 7.5" (1.715 m)     Head Circumference --      Peak Flow --      Pain Score --      Pain Loc --      Pain Edu? --      Excl. in [place]? --     Most recent vital signs: Vitals:   11/22/22 1312 11/22/22 1400  BP:  (!) 142/80  Pulse:  82  Resp:  (!) 25  Temp: 98 F (36.7 C)   SpO2:  95%     General: Awake, no distress.  CV:  Good peripheral perfusion.  Resp:  Normal effort.  Patient has diffuse wheezing but normal respiratory effort Abd:  No distention.  Soft nontender Neuro:             Awake, Alert, Oriented x 3  Other:  Pitting edema in the bilateral lower extremities   ED Results / Procedures / Treatments  Labs (all labs ordered are listed, but only abnormal results are displayed) Labs Reviewed   COMPREHENSIVE METABOLIC PANEL - Abnormal; Notable for the following components:      Result Value   Glucose, Bld 139 (*)    BUN 54 (*)    Creatinine, Ser 5.38 (*)    Calcium 8.5 (*)    Albumin 3.4 (*)    Total Bilirubin 1.3 (*)    GFR, Estimated 12 (*)    Anion gap 3 (*)    All other components within normal limits  CBC WITH DIFFERENTIAL/PLATELET - Abnormal; Notable  for the following components:   WBC 12.4 (*)    RBC 3.85 (*)    Hemoglobin 11.6 (*)    HCT 35.8 (*)    Neutro Abs 10.3 (*)    All other components within normal limits  PHOSPHORUS - Abnormal; Notable for the following components:   Phosphorus 4.7 (*)    All other components within normal limits  TROPONIN I (HIGH SENSITIVITY) - Abnormal; Notable for the following components:   Troponin I (High Sensitivity) 149 (*)    All other components within normal limits  RESP PANEL BY RT-PCR (RSV, FLU A&B, COVID)  [id number]  MAGNESIUM  BRAIN NATRIURETIC PEPTIDE  TROPONIN I (HIGH SENSITIVITY)     EKG  EKG reviewed interpreted myself shows sinus rhythm with normal axis normal intervals septal Q waves 2 inversions in the lateral precordial leads   RADIOLOGY  Chest x-ray reviewed interpreted myself shows interstitial prominence  PROCEDURES:  Critical Care performed: No  .1-3 Lead EKG Interpretation  Performed by: Rada Hay, MD Authorized by: Rada Hay, MD     Interpretation: normal     ECG rate assessment: normal     Rhythm: sinus rhythm     Ectopy: none     Conduction: normal     The patient is on the cardiac monitor to evaluate for evidence of arrhythmia and/or significant heart rate changes.   MEDICATIONS ORDERED IN ED: Medications  aspirin chewable tablet 324 mg (has no administration in time range)  ipratropium-albuterol (DUONEB) 0.5-2.5 (3) MG/3ML nebulizer solution 3 mL (3 mLs Nebulization Given 11/22/22 1334)  furosemide (LASIX) injection 80 mg (80 mg Intravenous Given 11/22/22 1529)      IMPRESSION / MDM / ASSESSMENT AND PLAN / ED COURSE  I reviewed the triage vital signs and the nursing notes.                              Patient's presentation is most consistent with acute presentation with potential threat to life or bodily function.  Differential diagnosis includes, but is not limited to, pulmonary edema secondary to dialysis noncompliance, COPD exacerbation, pneumonia, viral illness, pleural effusion, pneumothorax, PE  Patient is a 56 year old male with multiple medical comorbidities including end-stage renal disease presents with several days of dyspnea and nonproductive cough.  He missed dialysis yesterday and today last was dialyzed on Saturday.  No new chest pain does have some referred pain from left shoulder from rotator cuff tear but this is not new.  He is on 3 L chronically he is not hypoxic here on his 3 L does have wheezing throughout and some pitting edema but is able to speak in full sentences and is not in respiratory distress.  EKG without obvious signs of hyperkalemia.  Will check labs chest x-ray anticipate patient will need dialysis.  Patient signed out to oncoming provider pending labs.  Anticipate need for admission and nephrology for dialysis.  I have let Dr. Holley Raring with nephrology know about the patient.      FINAL CLINICAL IMPRESSION(S) / ED DIAGNOSES   Final diagnoses:  Acute pulmonary edema ([place])     Rx / DC Orders   ED Discharge Orders     None        Note:  This document was prepared using Dragon voice recognition software and may include unintentional dictation errors.   Rada Hay, MD 11/22/22 1540

## 2022-11-22 NOTE — Assessment & Plan Note (Addendum)
Appears to be acquired secondary to stroke - This is likely contributing to patient's not adhering to his medications and dialysis treatment sessions

## 2022-11-22 NOTE — Assessment & Plan Note (Addendum)
-   Presumed secondary to demand ischemia and it is downtrending - Will monitor telemetry

## 2022-11-22 NOTE — Assessment & Plan Note (Signed)
-   Presumed secondary to interstitial edema consistent with missing dialysis - Status post furosemide 80 mg IV per [place] nephrology consultation for dialysis - Admit to telemetry medical, observation

## 2022-11-22 NOTE — Hospital Course (Addendum)
[name] is a 56 year old male with history of COPD, end-stage renal disease on hemodialysis Tuesday Thursday Saturday, chronic respiratory failure on 3 L of oxygen by nasal cannula, hypertension, obstructive sleep apnea, morbid obesity, acquired mild cognitive impairment presumed secondary to history of stroke, presented hospital with shortness of breath for 2 to 3 days with ambulatory dysfunction.  He felt short winded so he did not go to his dialysis sessions.  In the ED patient was noted to be tachypneic and was on 2 L of oxygen with 95% saturation.  Labs showed creatinine of 5.3. High sensitive troponin was 149, BNP was elevated at 625.3.  COVID/influenza A/influenza B/RSV PCR were negative.  Nephrology was consulted from the ED, aspirin and Lasix was given including DuoNebs and was admitted to the hospital for further evaluation and treatment.

## 2022-11-22 NOTE — ED Notes (Signed)
Attempted for 20g IV with [place] at R anterior lower fa.

## 2022-11-22 NOTE — ED Notes (Signed)
Attempted for 20g at R posterior lower fa. Cannot use L arm as old dialysis site present. New R chest dialysis access noted. Pt twitches regularly and cannot fully straighten R arm or turn palm to ceiling ; pt states this is chronic. Blood return noted but unable to full thread line. Will attempt with [place] soon.

## 2022-11-23 DIAGNOSIS — F1910 Other psychoactive substance abuse, uncomplicated: Secondary | ICD-10-CM

## 2022-11-23 DIAGNOSIS — F32A Depression, unspecified: Secondary | ICD-10-CM | POA: Diagnosis present

## 2022-11-23 DIAGNOSIS — Z720 Tobacco use: Secondary | ICD-10-CM

## 2022-11-23 DIAGNOSIS — D631 Anemia in chronic kidney disease: Secondary | ICD-10-CM | POA: Diagnosis present

## 2022-11-23 DIAGNOSIS — N185 Chronic kidney disease, stage 5: Secondary | ICD-10-CM

## 2022-11-23 DIAGNOSIS — E1159 Type 2 diabetes mellitus with other circulatory complications: Secondary | ICD-10-CM

## 2022-11-23 DIAGNOSIS — G3184 Mild cognitive impairment, so stated: Secondary | ICD-10-CM

## 2022-11-23 DIAGNOSIS — E1122 Type 2 diabetes mellitus with diabetic chronic kidney disease: Secondary | ICD-10-CM | POA: Diagnosis present

## 2022-11-23 DIAGNOSIS — I152 Hypertension secondary to endocrine disorders: Secondary | ICD-10-CM

## 2022-11-23 DIAGNOSIS — Z789 Other specified health status: Secondary | ICD-10-CM

## 2022-11-23 DIAGNOSIS — G4733 Obstructive sleep apnea (adult) (pediatric): Secondary | ICD-10-CM

## 2022-11-23 DIAGNOSIS — E8770 Fluid overload, unspecified: Secondary | ICD-10-CM | POA: Diagnosis present

## 2022-11-23 DIAGNOSIS — J810 Acute pulmonary edema: Secondary | ICD-10-CM | POA: Diagnosis present

## 2022-11-23 DIAGNOSIS — R7989 Other specified abnormal findings of blood chemistry: Secondary | ICD-10-CM

## 2022-11-23 DIAGNOSIS — Z6837 Body mass index (BMI) 37.0-37.9, adult: Secondary | ICD-10-CM | POA: Diagnosis not present

## 2022-11-23 DIAGNOSIS — E785 Hyperlipidemia, unspecified: Secondary | ICD-10-CM | POA: Diagnosis not present

## 2022-11-23 DIAGNOSIS — F17210 Nicotine dependence, cigarettes, uncomplicated: Secondary | ICD-10-CM | POA: Diagnosis present

## 2022-11-23 DIAGNOSIS — N186 End stage renal disease: Secondary | ICD-10-CM

## 2022-11-23 DIAGNOSIS — Z992 Dependence on renal dialysis: Secondary | ICD-10-CM

## 2022-11-23 DIAGNOSIS — E781 Pure hyperglyceridemia: Secondary | ICD-10-CM | POA: Diagnosis present

## 2022-11-23 DIAGNOSIS — G8929 Other chronic pain: Secondary | ICD-10-CM | POA: Diagnosis present

## 2022-11-23 DIAGNOSIS — I2489 Other forms of acute ischemic heart disease: Secondary | ICD-10-CM | POA: Diagnosis present

## 2022-11-23 DIAGNOSIS — Z9981 Dependence on supplemental oxygen: Secondary | ICD-10-CM | POA: Diagnosis not present

## 2022-11-23 DIAGNOSIS — F418 Other specified anxiety disorders: Secondary | ICD-10-CM

## 2022-11-23 DIAGNOSIS — F419 Anxiety disorder, unspecified: Secondary | ICD-10-CM | POA: Diagnosis present

## 2022-11-23 DIAGNOSIS — J9611 Chronic respiratory failure with hypoxia: Secondary | ICD-10-CM | POA: Diagnosis present

## 2022-11-23 DIAGNOSIS — J439 Emphysema, unspecified: Secondary | ICD-10-CM | POA: Diagnosis present

## 2022-11-23 DIAGNOSIS — Z20822 Contact with and (suspected) exposure to covid-19: Secondary | ICD-10-CM | POA: Diagnosis present

## 2022-11-23 DIAGNOSIS — I5032 Chronic diastolic (congestive) heart failure: Secondary | ICD-10-CM | POA: Diagnosis present

## 2022-11-23 DIAGNOSIS — Z79899 Other long term (current) drug therapy: Secondary | ICD-10-CM | POA: Diagnosis not present

## 2022-11-23 DIAGNOSIS — Z91148 Patient's other noncompliance with medication regimen for other reason: Secondary | ICD-10-CM

## 2022-11-23 DIAGNOSIS — R0602 Shortness of breath: Secondary | ICD-10-CM | POA: Diagnosis present

## 2022-11-23 DIAGNOSIS — E1169 Type 2 diabetes mellitus with other specified complication: Secondary | ICD-10-CM | POA: Diagnosis present

## 2022-11-23 DIAGNOSIS — I132 Hypertensive heart and chronic kidney disease with heart failure and with stage 5 chronic kidney disease, or end stage renal disease: Secondary | ICD-10-CM | POA: Diagnosis present

## 2022-11-23 DIAGNOSIS — Z91158 Patient's noncompliance with renal dialysis for other reason: Secondary | ICD-10-CM | POA: Diagnosis not present

## 2022-11-23 LAB — GLUCOSE, CAPILLARY
Glucose-Capillary: 122 mg/dL — ABNORMAL HIGH (ref 70–99)
Glucose-Capillary: 138 mg/dL — ABNORMAL HIGH (ref 70–99)
Glucose-Capillary: 117 mg/dL — ABNORMAL HIGH (ref 70–99)

## 2022-11-23 LAB — BASIC METABOLIC PANEL
Anion gap: 9 (ref 5–15)
BUN: 63 mg/dL — ABNORMAL HIGH (ref 6–20)
CO2: 23 mmol/L (ref 22–32)
Calcium: 8.3 mg/dL — ABNORMAL LOW (ref 8.9–10.3)
Chloride: 110 mmol/L (ref 98–111)
Creatinine, Ser: 5.91 mg/dL — ABNORMAL HIGH (ref 0.61–1.24)
GFR, Estimated: 10 mL/min — ABNORMAL LOW (ref >60)
Glucose, Bld: 135 mg/dL — ABNORMAL HIGH (ref 70–99)
Potassium: 4.7 mmol/L (ref 3.5–5.1)
Sodium: 142 mmol/L (ref 135–145)

## 2022-11-23 LAB — CBC
HCT: 33.1 % — ABNORMAL LOW (ref 39.0–52.0)
HCT: 34.9 % — ABNORMAL LOW (ref 39.0–52.0)
Hemoglobin: 10.8 g/dL — ABNORMAL LOW (ref 13.0–17.0)
Hemoglobin: 11.5 g/dL — ABNORMAL LOW (ref 13.0–17.0)
MCH: 30.6 pg (ref 26.0–34.0)
MCH: 30.3 pg (ref 26.0–34.0)
MCHC: 32.6 g/dL (ref 30.0–36.0)
MCHC: 33 g/dL (ref 30.0–36.0)
MCV: 93.8 fL (ref 80.0–100.0)
MCV: 92.1 fL (ref 80.0–100.0)
Platelets: 227 10*3/uL (ref 150–400)
Platelets: 226 10*3/uL (ref 150–400)
RBC: 3.53 MIL/uL — ABNORMAL LOW (ref 4.22–5.81)
RBC: 3.79 MIL/uL — ABNORMAL LOW (ref 4.22–5.81)
RDW: 13.3 % (ref 11.5–15.5)
RDW: 13.2 % (ref 11.5–15.5)
WBC: 9.8 10*3/uL (ref 4.0–10.5)
WBC: 9.9 10*3/uL (ref 4.0–10.5)
nRBC: 0 % (ref 0.0–0.2)
nRBC: 0 % (ref 0.0–0.2)

## 2022-11-23 LAB — PHOSPHORUS: Phosphorus: 5.8 mg/dL — ABNORMAL HIGH (ref 2.5–4.6)

## 2022-11-23 LAB — HEPATITIS B SURFACE ANTIGEN: Hepatitis B Surface Ag: NONREACTIVE

## 2022-11-23 LAB — MAGNESIUM: Magnesium: 2.2 mg/dL (ref 1.7–2.4)

## 2022-11-23 MED ORDER — INFLUENZA VAC SPLIT QUAD 0.5 ML IM SUSY: 0.5000 mL | PREFILLED_SYRINGE | INTRAMUSCULAR | Status: DC

## 2022-11-23 MED ORDER — PNEUMOCOCCAL 20-VAL CONJ VACC 0.5 ML IM SUSY: 0.5000 mL | PREFILLED_SYRINGE | INTRAMUSCULAR | Status: DC

## 2022-11-23 NOTE — Progress Notes (Addendum)
PROGRESS NOTE    Derek Cooley.  ZOX:[id number] DOB: Apr 30, 1966 DOA: 11/22/2022 PCP: Derek Billings, NP    Brief Narrative:  [name] is a 56 year old male with history of COPD, end-stage renal disease on hemodialysis Tuesday Thursday Saturday, chronic respiratory failure on 3 L of oxygen by nasal cannula, hypertension, obstructive sleep apnea, morbid obesity, acquired mild cognitive impairment presumed secondary to history of stroke, presented hospital with shortness of breath for 2 to 3 days with ambulatory dysfunction.  He felt short winded so he did not go to his dialysis sessions.  In the ED patient was noted to be tachypneic and was on 2 L of oxygen with 95% saturation.  Labs showed creatinine of 5.3. High sensitive troponin was 149, BNP was elevated at 625.3.  COVID/influenza A/influenza B/RSV PCR were negative.  Nephrology was consulted from the ED, aspirin and Lasix was given including DuoNebs and was admitted to the hospital for further evaluation and treatment.  Assessment and Plan:   Shortness of breath/ dyspnea.   Chronic hypoxic respiratory failure in 3 L of oxygen at baseline. Thought to be secondary to interstitial edema from missing dialysis.  Received 80 mg of IV Lasix in the ED.  Nephrology has been consulted for dialysis.     ESRD on hemodialysis  Patient was started on dialysis July 2023 and is on Tuesday Thursday Saturday schedule.  Nephrology on board for dialysis.   Tobacco abuse Continue nicotine patch.   Type II diabetes mellitus with renal manifestations Continue sliding scale insulin, diabetic diet, Accu-Cheks.   Depression with anxiety Duloxetine but not taking it.  Elevated troponin Likely secondary to demand ischemia.   Polysubstance abuse ([place]) No active usage.  Mild cognitive impairment Secondary to previous stroke.   Morbid obesity (HCC)  BMI of 37.2.  Would benefit from weight loss as outpatient.   Hypertension associated  with diabetes  Supposed to be on amlodipine 10 mg daily, carvedilol 25 mg p.o. twice daily, losartan 100 mg daily but not taking appropriately.  Continue amlodipine Coreg while in the hospital.       DVT prophylaxis: heparin injection 5,000 Units Start: 11/22/22 1700 Place TED hose Start: 11/22/22 1655   Code Status:     Code Status: Full Code  Disposition: Home likely 11/24/2022  Status is: Observation  The patient will require care spanning > 2 midnights and should be moved to inpatient because: Respiratory failure, volume overload, hemodialysis,   Family Communication: Spoke with the patient at bedside.  Consultants:  Nephrology  Procedures:  Hemodialysis  Antimicrobials:  None  Anti-infectives (From admission, onward)    None      Subjective: Today, patient was seen and examined at bedside.  For hemodialysis.  Feels little better with breathing.  Denies any chest pain, dizziness, fever, chills or cough.  Objective: Vitals:   11/23/22 0830 11/23/22 0846 11/23/22 0852 11/23/22 0900  BP: (!) 157/80 (!) 142/67 (!) 161/82   Pulse: 85 86 84   Resp: (!) 22 (!) 24 (!) 24   Temp:  98.6 F (37 C)    TempSrc:  Oral    SpO2: 95% 95% 95%   Weight:    104 kg  Height:        Intake/Output Summary (Last 24 hours) at 11/23/2022 1021 Last data filed at 11/23/2022 0852 Gross per 24 hour  Intake 0 ml  Output 4200 ml  Net -4200 ml   Filed Weights   11/22/22 1316 11/23/22 0456 11/23/22  0900  Weight: 109.5 kg 106.7 kg 104 kg    Physical Examination: Body mass index is 35.38 kg/m.  General: Obese built, not in obvious distress, on 4 L of oxygen. HENT:   No scleral pallor or icterus noted. Oral mucosa is moist.  Right internal jugular hemodialysis catheter in place. Chest:    Diminished breath sounds bilaterally.   CVS: S1 &S2 heard. No murmur.  Regular rate and rhythm. Abdomen: Soft, nontender, nondistended.  Bowel sounds are heard.   Extremities: No cyanosis,  clubbing or edema.  Peripheral pulses are palpable.  Left upper extremity fistula. Psych: Alert, awake and oriented, normal mood CNS:  No cranial nerve deficits.  Power equal in all extremities.   Skin: Warm and dry.  No rashes noted.  Data Reviewed:   CBC: Recent Labs  Lab 11/22/22 1320 11/23/22 0429  WBC 12.4* 9.8  NEUTROABS 10.3*  --   HGB 11.6* 10.8*  HCT 35.8* 33.1*  MCV 93.0 93.8  PLT 217 637    Basic Metabolic Panel: Recent Labs  Lab 11/22/22 1320 11/23/22 0429  NA 136 142  K 4.9 4.7  CL 111 110  CO2 22 23  GLUCOSE 139* 135*  BUN 54* 63*  CREATININE 5.38* 5.91*  CALCIUM 8.5* 8.3*  MG 2.1 2.2  PHOS 4.7* 5.8*    Liver Function Tests: Recent Labs  Lab 11/22/22 1320  AST 17  ALT 18  ALKPHOS 108  BILITOT 1.3*  PROT 6.9  ALBUMIN 3.4*     Radiology Studies: DG Chest Portable 1 View  Result Date: 11/22/2022 CLINICAL DATA:  Short of breath for 3 days.  Progressive dyspnea EXAM: PORTABLE CHEST 1 VIEW COMPARISON:  09/06/2022 FINDINGS: Normal cardiac silhouette with large-bore central venous line. Interval increase in interstitial edema pattern. No focal consolidation. No pneumothorax. IMPRESSION: Increasing interstitial edema. Electronically Signed   By: Suzy Bouchard M.D.   On: 11/22/2022 14:20      LOS: 0 days   Flora Lipps, MD [facility] Available via Epic secure chat 7am-7pm After these hours, please refer to coverage provider listed on [email or web address] 11/23/2022, 10:21 AM

## 2022-11-23 NOTE — Progress Notes (Signed)
Venous lumen would not aspirate blood and flushing was very difficult, patient stated they have been having difficulties in the center cannulating his new access and have been running him 1:1. Successfully was able to cannulate with 17g needle and the patient is running at a 250 blood flow rate. The patient is moving around quite and bit will monitor closely.

## 2022-11-23 NOTE — Progress Notes (Signed)
[facility]  ROUNDING NOTE   Subjective:   Derek Cooley. Is a 56 y.o. male with past medical history of respiratory failure with home oxygen, hypertension, sleep apnea, morbid obesity, and end stage renal disease on hemodialysis. Patient presents to ED with shortness of breath and has been admitted for Shortness of breath [R06.02] Acute pulmonary edema ([place]) [J81.0] Need for acute hemodialysis ([place]) [Z99.2] Dyspnea, unspecified type [R06.00]  Patient known to our practice and receives outpatient dialysis at [facility] on a TTS schedule. Last treatment received on last Saturday. States he was feeling unwell and short of breath, which caused him to missed his dialysis treatments. Patient is seen and evaluated during dialysis.   HEMODIALYSIS FLOWSHEET:  Blood Flow Rate (mL/min): 250 mL/min Arterial Pressure (mmHg): -220 mmHg Venous Pressure (mmHg): 290 mmHg TMP (mmHg): 30 mmHg Ultrafiltration Rate (mL/min): 1151 mL/min Dialysate Flow Rate (mL/min): 300 ml/min  States he would become short of breath with ADL's. Denies sick contacts. Denies known fever or chills. Oxygen requirement slowly increased overnight, 2L to 4L. Lower extremity edema present.   Labs on ED arrival significant for glucose 139, BUN 54, creatinine 5.38 with GFR 12, albumin 3.4, BNP 625, troponin 149, WBCs 12.4, with hemoglobin 11.6.  Respiratory panel negative for influenza, COVID-19, and RSV.  Chest x-ray shows interstitial edema.  We have been consulted to provide dialysis during this admission.   Objective:  Vital signs in last 24 hours:  Temp:  [97.6 F (36.4 C)-98.6 F (37 C)] 98.6 F (37 C) (12/15 0846) Pulse Rate:  [40-89] 84 (12/15 0852) Resp:  [17-29] 24 (12/15 0852) BP: (110-176)/(67-118) 161/82 (12/15 0852) SpO2:  [92 %-100 %] 95 % (12/15 0852) Weight:  [104 kg-109.5 kg] 104 kg (12/15 0900)  Weight change:  Filed Weights   11/22/22 1316 11/23/22 0456 11/23/22 0900   Weight: 109.5 kg 106.7 kg 104 kg    Intake/Output: I/O last 3 completed shifts: In: 0  Out: 1400 [Urine:1400]   Intake/Output this shift:  Total I/O In: -  Out: 2800 [Other:2800]  Physical Exam: General: NAD  Head: Normocephalic, atraumatic. Moist oral mucosal membranes  Eyes: Anicteric  Lungs:  Scattered rhonchi, and CO2  Heart: Regular rate and rhythm  Abdomen:  Soft, nontender  Extremities: 1+ peripheral edema.  Neurologic: Nonfocal, moving all four extremities  Skin: No lesions  Access: Right chest PermCath    Basic Metabolic Panel: Recent Labs  Lab 11/22/22 1320 11/23/22 0429  NA 136 142  K 4.9 4.7  CL 111 110  CO2 22 23  GLUCOSE 139* 135*  BUN 54* 63*  CREATININE 5.38* 5.91*  CALCIUM 8.5* 8.3*  MG 2.1 2.2  PHOS 4.7* 5.8*    Liver Function Tests: Recent Labs  Lab 11/22/22 1320  AST 17  ALT 18  ALKPHOS 108  BILITOT 1.3*  PROT 6.9  ALBUMIN 3.4*   No results for input(s): "LIPASE", "AMYLASE" in the last 168 hours. No results for input(s): "AMMONIA" in the last 168 hours.  CBC: Recent Labs  Lab 11/22/22 1320 11/23/22 0429 11/23/22 1036  WBC 12.4* 9.8 9.9  NEUTROABS 10.3*  --   --   HGB 11.6* 10.8* 11.5*  HCT 35.8* 33.1* 34.9*  MCV 93.0 93.8 92.1  PLT 217 227 226    Cardiac Enzymes: No results for input(s): "CKTOTAL", "CKMB", "CKMBINDEX", "TROPONINI" in the last 168 hours.  BNP: Invalid input(s): "POCBNP"  CBG: Recent Labs  Lab 11/22/22 2128 11/23/22 0132  GLUCAP 124*  48*    Microbiology: Results for orders placed or performed during the hospital encounter of 11/22/22  Resp panel by RT-PCR (RSV, Flu A&B, Covid) Anterior Nasal Swab     Status: None   Collection Time: 11/22/22  1:19 PM   Specimen: Anterior Nasal Swab  Result Value Ref Range Status   SARS Coronavirus 2 by RT PCR NEGATIVE NEGATIVE Final   Influenza A by PCR NEGATIVE NEGATIVE Final   Influenza B by PCR NEGATIVE NEGATIVE Final   Resp Syncytial Virus by PCR  NEGATIVE NEGATIVE Final    Comment: Performed at [facility], [address] [place], [place] [address]    Coagulation Studies: No results for input(s): "LABPROT", "INR" in the last 72 hours.  Urinalysis: No results for input(s): "COLORURINE", "LABSPEC", "PHURINE", "GLUCOSEU", "HGBUR", "BILIRUBINUR", "KETONESUR", "PROTEINUR", "UROBILINOGEN", "NITRITE", "LEUKOCYTESUR" in the last 72 hours.  Invalid input(s): "APPERANCEUR"    Imaging: DG Chest Portable 1 View  Result Date: 11/22/2022 CLINICAL DATA:  Short of breath for 3 days.  Progressive dyspnea EXAM: PORTABLE CHEST 1 VIEW COMPARISON:  09/06/2022 FINDINGS: Normal cardiac silhouette with large-bore central venous line. Interval increase in interstitial edema pattern. No focal consolidation. No pneumothorax. IMPRESSION: Increasing interstitial edema. Electronically Signed   By: Suzy Bouchard M.D.   On: 11/22/2022 14:20     Medications:    anticoagulant sodium citrate      amLODipine  10 mg Oral Daily   carvedilol  25 mg Oral BID WC   Chlorhexidine Gluconate Cloth  6 each Topical Q0600   heparin  5,000 Units Subcutaneous Q8H   [START ON 11/24/2022] influenza vac split quadrivalent PF  0.5 mL Intramuscular Tomorrow-1000   insulin aspart  0-5 Units Subcutaneous QHS   insulin aspart  0-6 Units Subcutaneous TID WC   losartan  100 mg Oral Daily   [START ON 11/24/2022] pneumococcal 20-valent conjugate vaccine  0.5 mL Intramuscular Tomorrow-1000   acetaminophen **OR** acetaminophen, alteplase, anticoagulant sodium citrate, heparin, lidocaine (PF), lidocaine-prilocaine, nicotine, ondansetron **OR** ondansetron (ZOFRAN) IV, pentafluoroprop-tetrafluoroeth, senna-docusate  Assessment/ Plan:  [name] is a 56 y.o.  male with past medical history of respiratory failure with home oxygen, hypertension, sleep apnea, morbid obesity, and end stage renal disease on hemodialysis. Patient presents to ED with shortness of  breath and has been admitted for Shortness of breath [R06.02] Acute pulmonary edema ([place]) [J81.0] Need for acute hemodialysis ([place]) [Z99.2] Dyspnea, unspecified type [R06.00]  [facility]/TTS/right PermCath  Fluid volume overload with end-stage renal disease on hemodialysis.  Patient has missed 2 dialysis treatments.  Interstitial edema seen on chest x-ray.  Currently receiving dialysis, UF goal 2.5 - 3 L as tolerated.  Will provide additional treatment tomorrow to maintain outpatient schedule.  2. Anemia of chronic kidney disease Lab Results  Component Value Date   HGB 11.5 (L) 11/23/2022    Hemoglobin acceptable.  Patient receives [place] outpatient.  3. Secondary Hyperparathyroidism: with outpatient labs: PTH 389, phosphorus 3.9, calcium 9.0 on 10/23/22.   Lab Results  Component Value Date   PTH 135 (H) 12/07/2021   PTH Comment 12/07/2021   CALCIUM 8.3 (L) 11/23/2022   PHOS 5.8 (H) 11/23/2022    Calcium slightly decreased but acceptable.  Continue sevelamer with meals.  4.  Hypertension with chronic kidney disease.  Home regimen includes amlodipine, carvedilol, hydralazine, losartan, and torsemide.  Hydralazine currently held.Blood pressure currently 157/80.    LOS: 0   12/15/202312:34 PM

## 2022-11-23 NOTE — Progress Notes (Signed)
Pt machine alarms high vp and tmp. Treatment ended 17 minutes early. Rebbeca Paul, NP notified. Pt alert, stable.

## 2022-11-23 NOTE — Discharge Planning (Signed)
[facility]  [address], [place] [address] [phone]  Scheduled Days Tuesday Thursday and Saturday  Treatment Time: 11:15am  Schedule confirmed with Hinton Dyer CNM. Patient stated no dialysis concerns. Discussed the importance of not missing treatments. Patient confided that "sometimes my family causes me to have anxiety and so I miss my treatments because I do not want to go or do anything". Discussed this concern with Benancio Deeds NP and Leanna Battles at clinic.

## 2022-11-23 NOTE — Progress Notes (Signed)
Post hd rn assessment 

## 2022-11-23 NOTE — Progress Notes (Signed)
Patient was admitted due to sob and fluid overload. He has not been to dialysis for a week and is a smoker. States that he does want vaccines-ordered. Denied pain. Chronically on 2L @ home but currently on 4L due to pulmonary edema. Has expiratory wheezing throughout lung fields. He is oliguric and has dribble incontinence. Pants were soaked in urine upon arrival-purewick placed. No skin issues assessed. Blood glucose upon arrival was 122-checked by tech Mini. Placed on isolation for respiratory testing-all negative. Messaged on call provider about removing isolation order-no response. Will notify day team. Report given to dialysis and patient taken down for early dialysis at 0430-CHG completed. AM Heparin not given d/t patient being off the floor and heparin given in dialysis. Patient denied additional needs.

## 2022-11-23 NOTE — Progress Notes (Signed)
Right subclavian catheter poorly functioning on venous port, without blood return, flushes. Patient has LFA AVF, cannulated with 17g needle BFR reduced to 250, maintaining targeted UF at 3000 ml.

## 2022-11-24 DIAGNOSIS — G4733 Obstructive sleep apnea (adult) (pediatric): Secondary | ICD-10-CM | POA: Diagnosis not present

## 2022-11-24 DIAGNOSIS — R0602 Shortness of breath: Secondary | ICD-10-CM | POA: Diagnosis not present

## 2022-11-24 DIAGNOSIS — E8770 Fluid overload, unspecified: Secondary | ICD-10-CM

## 2022-11-24 LAB — BASIC METABOLIC PANEL
Anion gap: 5 (ref 5–15)
BUN: 46 mg/dL — ABNORMAL HIGH (ref 6–20)
CO2: 26 mmol/L (ref 22–32)
Calcium: 8.3 mg/dL — ABNORMAL LOW (ref 8.9–10.3)
Chloride: 106 mmol/L (ref 98–111)
Creatinine, Ser: 4.87 mg/dL — ABNORMAL HIGH (ref 0.61–1.24)
GFR, Estimated: 13 mL/min — ABNORMAL LOW (ref >60)
Glucose, Bld: 108 mg/dL — ABNORMAL HIGH (ref 70–99)
Potassium: 4 mmol/L (ref 3.5–5.1)
Sodium: 137 mmol/L (ref 135–145)

## 2022-11-24 LAB — HEPATITIS B SURFACE ANTIBODY, QUANTITATIVE: Hep B S AB Quant (Post): <3.1 m[IU]/mL — ABNORMAL LOW (ref >9.9)

## 2022-11-24 LAB — GLUCOSE, CAPILLARY: Glucose-Capillary: 92 mg/dL (ref 70–99)

## 2022-11-24 MED ORDER — IPRATROPIUM-ALBUTEROL 0.5-2.5 (3) MG/3ML IN SOLN: 3.0000 mL | RESPIRATORY_TRACT | Status: DC | PRN

## 2022-11-24 MED ORDER — HEPARIN SODIUM (PORCINE) 1000 UNIT/ML IJ SOLN
INTRAMUSCULAR | Status: AC
  Filled 2022-11-24: qty 10

## 2022-11-24 MED ORDER — NICOTINE 21 MG/24HR TD PT24: 21.0000 mg | MEDICATED_PATCH | Freq: Every day | TRANSDERMAL | 0 refills | Status: DC | PRN

## 2022-11-24 MED ORDER — DULOXETINE HCL 60 MG PO CPEP: 60.0000 mg | ORAL_CAPSULE | Freq: Every day | ORAL | 0 refills | Status: DC

## 2022-11-24 MED ORDER — ALTEPLASE 2 MG IJ SOLR
INTRAMUSCULAR | Status: AC
  Administered 2022-11-24: 2 mg
  Filled 2022-11-24: qty 2

## 2022-11-24 NOTE — Progress Notes (Signed)
Pre hd rn assessment 

## 2022-11-24 NOTE — Progress Notes (Signed)
Patient did 3.5 hours of HD, tolerated well.   UF: 3000 ml    11/24/22 1350  Vitals  Temp 98.7 F (37.1 C)  Temp Source Axillary  BP Location Right Wrist  BP Method Automatic  Patient Position (if appropriate) Lying  Pulse Rate 79  Pulse Rate Source Monitor  ECG Heart Rate 81  Resp (!) 25  Oxygen Therapy  SpO2 97 %  O2 Device Nasal Cannula  O2 Flow Rate (L/min) 4 L/min  Patient Activity (if Appropriate) In bed  Pulse Oximetry Type Continuous  Post Treatment  Dialyzer Clearance Lightly streaked  Duration of HD Treatment -hour(s) 3.5 hour(s)  Hemodialysis Intake (mL) 0 mL  Liters Processed 84  Fluid Removed (mL) 3000 mL  Tolerated HD Treatment Yes  Fistula / Graft Left Forearm Arteriovenous fistula  Placement Date/Time: 06/23/22 0000   Orientation: Left  Access Location: Forearm  Access Type: Arteriovenous fistula  Site Condition No complications  Fistula / Graft Assessment Present;Thrill;Bruit  Drainage Description None  Hemodialysis Catheter Right Subclavian Double lumen Permanent (Tunneled)  Placement Date/Time: 06/27/22 1248   Time Out: Correct patient;Correct site;Correct procedure  Maximum sterile barrier precautions: Hand hygiene;Cap;Mask;Sterile gown;Large sterile sheet;Sterile gloves  Site Prep: Chlorhexidine (preferred)  Local Anes...  Site Condition No complications  Blue Lumen Status Flushed;Heparin locked;Dead end cap in place  Red Lumen Status Flushed;Heparin locked;Dead end cap in place  Purple Lumen Status N/A  Catheter fill solution Heparin 1000 units/ml  Catheter fill volume (Arterial) 2 cc  Catheter fill volume (Venous) 2.2  Dressing Type Transparent  Dressing Status Antimicrobial disc in place  Drainage Description None  Dressing Change Due 12/01/22  Post treatment catheter status Capped and Clamped

## 2022-11-24 NOTE — Discharge Summary (Signed)
Physician Discharge Summary   Patient: Derek Cooley. MRN: [id number] DOB: 1966-07-12  Admit date:     11/22/2022  Discharge date: 11/24/22  Discharge Physician: Corrie Mckusick Alithia Zavaleta   PCP: Jon Billings, NP   Recommendations at discharge:   Follow-up with your primary care physician in 1 week.   Check CBC BMP at that time. Patient needs to continue hemodialysis as outpatient.  Discharge Diagnoses: Principal Problem:   Shortness of breath Active Problems:   HLD (hyperlipidemia)   Tobacco abuse   Type II diabetes mellitus with renal manifestations (HCC)   Depression with anxiety   Hypertension associated with diabetes ([place])   Morbid obesity (HCC)   OSA (obstructive sleep apnea)   Statin intolerance   Hypertriglyceridemia   Nonadherence to medication   Mild cognitive impairment   ESRD on hemodialysis (HCC)   Polysubstance abuse (HCC)   Elevated troponin  Resolved Problems:   Volume overload  Hospital Course: [name] is a 56 year old male with history of COPD, end-stage renal disease on hemodialysis Tuesday, Thursday Saturday, chronic respiratory failure on 3 L of oxygen by nasal cannula, hypertension, obstructive sleep apnea, morbid obesity, acquired mild cognitive impairment presumed secondary to history of stroke, presented hospital with shortness of breath for 2 to 3 days with ambulatory dysfunction. He felt short winded so he did not go to his dialysis sessions. In the ED, patient was noted to be tachypneic and was on 2 L of oxygen with 95% saturation. Labs showed creatinine of 5.3. High sensitive troponin was 149, BNP was elevated at 625.3. COVID/influenza A/influenza B/RSV PCR were negative. Nephrology was consulted from the ED, aspirin and Lasix was given including DuoNebs and was admitted to the hospital for further evaluation and treatment.   Assessment and Plan:  * Shortness of breath/ dyspnea.   Chronic hypoxic respiratory failure in 3 L of oxygen at  baseline. Thought to be secondary to interstitial edema from missing dialysis.  Received 80 mg of IV Lasix in the ED.  Nephrology was consulted for hemodialysis and patient underwent dialysis on 12/15 and 11/24/2022.  Nephrology has seen the patient and patient is stable from volume perspective.      ESRD on hemodialysis  Patient was started on dialysis July 2023 and is on Tuesday Thursday Saturday schedule.  Will resume hemodialysis on discharge.   Tobacco abuse Continue nicotine patch.   Type II diabetes mellitus with renal manifestations Diabetic diet.  Not on medications for diabetes at this time.   Depression with anxiety Duloxetine but not taking it.  Refill has been given.   Elevated troponin Likely secondary to demand ischemia.  No chest pain.   Polysubstance abuse ([place]) No active usage.   Mild cognitive impairment Secondary to previous stroke.  Able to understand the need for hemodialysis.   Morbid obesity (HCC)  BMI of 37.2.  Would benefit from weight loss as outpatient.   Hypertension associated with diabetes  Supposed to be on amlodipine 10 mg daily, carvedilol 25 mg p.o. twice daily, losartan 100 mg daily but not taking appropriately.  Refills will be given on all the medication.  Consultants: Nephrology Procedures performed: Hemodialysis Disposition: Home Diet recommendation:  Discharge Diet Orders (From admission, onward)     Start     Ordered   11/24/22 0000  Diet - low sodium heart healthy        11/24/22 1147           Cardiac diet DISCHARGE MEDICATION: Allergies as of  11/24/2022       Reactions   Atorvastatin    Joint Aches - Severe Joint Aches - Severe Joint Aches - Severe   Avelox [moxifloxacin Hcl In Nacl]    Muscle pain   Buprenorphine    Mouth sores, confusion, shaking   Dilaudid [hydromorphone Hcl] Hives   Fluoxetine Itching   Levofloxacin Other (See Comments)   Joint Pain   Morphine And Related    Other    Muscle pain    Suboxone [buprenorphine Hcl-naloxone Hcl] Other (See Comments)   Rash and confused   Vancomycin    Renal insufficiency        Medication List     STOP taking these medications    hydrOXYzine 50 MG tablet Commonly known as: ATARAX   pregabalin 50 MG capsule Commonly known as: LYRICA   Renvela 800 MG tablet Generic drug: sevelamer carbonate       TAKE these medications    albuterol 108 (90 Base) MCG/ACT inhaler Commonly known as: VENTOLIN HFA Inhale 1-2 puffs into the lungs every 6 (six) hours as needed for wheezing or shortness of breath.   amLODipine 10 MG tablet Commonly known as: NORVASC TAKE 1 TABLET(10 MG) BY MOUTH DAILY   aspirin EC 81 MG tablet Take 1 tablet (81 mg total) by mouth daily. Swallow whole.   carvedilol 25 MG tablet Commonly known as: COREG TAKE 1 TABLET(25 MG) BY MOUTH TWICE DAILY WITH A MEAL   DULoxetine 60 MG capsule Commonly known as: CYMBALTA Take 1 capsule (60 mg total) by mouth daily.   hydrALAZINE 100 MG tablet Commonly known as: APRESOLINE Take 100 mg by mouth 2 (two) times daily.   lidocaine-prilocaine cream Commonly known as: EMLA Apply 1 Application topically daily.   losartan 100 MG tablet Commonly known as: COZAAR Take 100 mg by mouth daily.   multivitamin Tabs tablet Take 1 tablet by mouth daily.   nicotine 21 mg/24hr patch Commonly known as: NICODERM CQ - dosed in mg/24 hours Place 1 patch (21 mg total) onto the skin daily as needed (nicotine craving).   OneTouch Ultra test strip Generic drug: glucose blood USE TO TEST BLOOD SUGAR DAILY   OXYGEN Inhale 3 L into the lungs daily.   pantoprazole 40 MG tablet Commonly known as: PROTONIX TAKE 1 TABLET(40 MG) BY MOUTH TWICE DAILY AS NEEDED   topiramate 50 MG tablet Commonly known as: TOPAMAX TAKE 1 TABLET(50 MG) BY MOUTH TWICE DAILY   torsemide 20 MG tablet Commonly known as: DEMADEX TAKE 2 TABLETS(40 MG) BY MOUTH DAILY       Subjective.  Patient was  seen and examined during hemodialysis.  Feels much better with breathing.  Feels at his baseline.    Discharge Exam: Filed Weights   11/23/22 0900 11/24/22 0844 11/24/22 1359  Weight: 104 kg 103.9 kg 99 kg   General: Obese built, not in obvious distress on 3 L of nasal cannula oxygen HENT:   No scleral pallor or icterus noted. Oral mucosa is moist.  Right internal jugular hemodialysis catheter in place Chest:  Diminished breath sounds bilaterally.  CVS: S1 &S2 heard. No murmur.  Regular rate and rhythm. Abdomen: Soft, nontender, nondistended.  Bowel sounds are heard.   Extremities: No cyanosis, clubbing or edema.  Peripheral pulses are palpable.  Left upper extremity fistula. Psych: Alert, awake and oriented, normal mood CNS:  No cranial nerve deficits.  Power equal in all extremities.   Skin: Warm and dry.  No rashes noted.  Condition at discharge: good  The results of significant diagnostics from this hospitalization (including imaging, microbiology, ancillary and laboratory) are listed below for reference.   Imaging Studies: DG Chest Portable 1 View  Result Date: 11/22/2022 CLINICAL DATA:  Short of breath for 3 days.  Progressive dyspnea EXAM: PORTABLE CHEST 1 VIEW COMPARISON:  09/06/2022 FINDINGS: Normal cardiac silhouette with large-bore central venous line. Interval increase in interstitial edema pattern. No focal consolidation. No pneumothorax. IMPRESSION: Increasing interstitial edema. Electronically Signed   By: Suzy Bouchard M.D.   On: 11/22/2022 14:20    Microbiology: Results for orders placed or performed during the hospital encounter of 11/22/22  Resp panel by RT-PCR (RSV, Flu A&B, Covid) Anterior Nasal Swab     Status: None   Collection Time: 11/22/22  1:19 PM   Specimen: Anterior Nasal Swab  Result Value Ref Range Status   SARS Coronavirus 2 by RT PCR NEGATIVE NEGATIVE Final   Influenza A by PCR NEGATIVE NEGATIVE Final   Influenza B by PCR NEGATIVE NEGATIVE  Final   Resp Syncytial Virus by PCR NEGATIVE NEGATIVE Final    Comment: Performed at [facility], [address] [place], [place] [address]    Labs: CBC: Recent Labs  Lab 11/22/22 1320 11/23/22 0429 11/23/22 1036  WBC 12.4* 9.8 9.9  NEUTROABS 10.3*  --   --   HGB 11.6* 10.8* 11.5*  HCT 35.8* 33.1* 34.9*  MCV 93.0 93.8 92.1  PLT 217 227 546   Basic Metabolic Panel: Recent Labs  Lab 11/22/22 1320 11/23/22 0429 11/24/22 0630  NA 136 142 137  K 4.9 4.7 4.0  CL 111 110 106  CO2 22 23 26   GLUCOSE 139* 135* 108*  BUN 54* 63* 46*  CREATININE 5.38* 5.91* 4.87*  CALCIUM 8.5* 8.3* 8.3*  MG 2.1 2.2  --   PHOS 4.7* 5.8*  --    Liver Function Tests: Recent Labs  Lab 11/22/22 1320  AST 17  ALT 18  ALKPHOS 108  BILITOT 1.3*  PROT 6.9  ALBUMIN 3.4*   CBG: Recent Labs  Lab 11/22/22 2128 11/23/22 0132 11/23/22 1733 11/23/22 2102 11/24/22 0717  GLUCAP 124* 122* 138* 117* 92    Discharge time spent: greater than 30 minutes.  Signed: Flora Lipps, MD [facility] 11/24/2022

## 2022-11-24 NOTE — Progress Notes (Signed)
Pt one hour cathflo dwell d/t venous pressures and catheter not functioning.

## 2022-11-24 NOTE — Progress Notes (Signed)
[facility]  Dialysis Note   Subjective:   Seen and examined on hemodialysis treatment.  Dialysis catheter is working after [employer]. He is tolerating dialysis well.   HEMODIALYSIS FLOWSHEET:  Blood Flow Rate (mL/min): 400 mL/min Arterial Pressure (mmHg): -160 mmHg Venous Pressure (mmHg): 200 mmHg TMP (mmHg): 9 mmHg Ultrafiltration Rate (mL/min): 1214 mL/min Dialysate Flow Rate (mL/min): 300 ml/min Dialysis Fluid Bolus: Normal Saline Bolus Amount (mL): 300 mL    Objective:  Vital signs in last 24 hours:  Temp:  [97.7 F (36.5 C)-98.7 F (37.1 C)] 98.6 F (37 C) (12/16 0829) Pulse Rate:  [67-91] 73 (12/16 1130) Resp:  [16-27] 22 (12/16 1130) BP: (107-181)/(69-106) 181/94 (12/16 1130) SpO2:  [95 %-100 %] 100 % (12/16 1130) Weight:  [103.9 kg] 103.9 kg (12/16 0844)  Weight change: -5.5 kg Filed Weights   11/23/22 0456 11/23/22 0900 11/24/22 0844  Weight: 106.7 kg 104 kg 103.9 kg    Intake/Output: I/O last 3 completed shifts: In: 0  Out: 3602 [Urine:801; Other:2800; Stool:1]   Intake/Output this shift:  No intake/output data recorded.  Physical Exam: General: NAD,   Head: Normocephalic, atraumatic. Moist oral mucosal membranes  Eyes: Anicteric, PERRL  Neck: Supple, trachea midline  Lungs:  Clear to auscultation  Heart: Regular rate and rhythm  Abdomen:  Soft, nontender,   Extremities:  peripheral edema.  Neurologic: Nonfocal, moving all four extremities  Skin: No lesions  Access:     Basic Metabolic Panel: Recent Labs  Lab 11/22/22 1320 11/23/22 0429 11/24/22 0630  NA 136 142 137  K 4.9 4.7 4.0  CL 111 110 106  CO2 22 23 26   GLUCOSE 139* 135* 108*  BUN 54* 63* 46*  CREATININE 5.38* 5.91* 4.87*  CALCIUM 8.5* 8.3* 8.3*  MG 2.1 2.2  --   PHOS 4.7* 5.8*  --     Liver Function Tests: Recent Labs  Lab 11/22/22 1320  AST 17  ALT 18  ALKPHOS 108  BILITOT 1.3*  PROT 6.9  ALBUMIN 3.4*   No results for input(s): "LIPASE",  "AMYLASE" in the last 168 hours. No results for input(s): "AMMONIA" in the last 168 hours.  CBC: Recent Labs  Lab 11/22/22 1320 11/23/22 0429 11/23/22 1036  WBC 12.4* 9.8 9.9  NEUTROABS 10.3*  --   --   HGB 11.6* 10.8* 11.5*  HCT 35.8* 33.1* 34.9*  MCV 93.0 93.8 92.1  PLT 217 227 226    Cardiac Enzymes: No results for input(s): "CKTOTAL", "CKMB", "CKMBINDEX", "TROPONINI" in the last 168 hours.  BNP: Invalid input(s): "POCBNP"  CBG: Recent Labs  Lab 11/22/22 2128 11/23/22 0132 11/23/22 1733 11/23/22 2102 11/24/22 0717  GLUCAP 124* 122* 138* 117* 92    Microbiology: Results for orders placed or performed during the hospital encounter of 11/22/22  Resp panel by RT-PCR (RSV, Flu A&B, Covid) Anterior Nasal Swab     Status: None   Collection Time: 11/22/22  1:19 PM   Specimen: Anterior Nasal Swab  Result Value Ref Range Status   SARS Coronavirus 2 by RT PCR NEGATIVE NEGATIVE Final   Influenza A by PCR NEGATIVE NEGATIVE Final   Influenza B by PCR NEGATIVE NEGATIVE Final   Resp Syncytial Virus by PCR NEGATIVE NEGATIVE Final    Comment: Performed at [facility], [address] [place], [place] [address]    Coagulation Studies: No results for input(s): "LABPROT", "INR" in the last 72 hours.  Urinalysis: No results for input(s): "COLORURINE", "LABSPEC", "PHURINE", "GLUCOSEU", "HGBUR", "BILIRUBINUR", "  KETONESUR", "PROTEINUR", "UROBILINOGEN", "NITRITE", "LEUKOCYTESUR" in the last 72 hours.  Invalid input(s): "APPERANCEUR"    Imaging: DG Chest Portable 1 View  Result Date: 11/22/2022 CLINICAL DATA:  Short of breath for 3 days.  Progressive dyspnea EXAM: PORTABLE CHEST 1 VIEW COMPARISON:  09/06/2022 FINDINGS: Normal cardiac silhouette with large-bore central venous line. Interval increase in interstitial edema pattern. No focal consolidation. No pneumothorax. IMPRESSION: Increasing interstitial edema. Electronically Signed   By: Suzy Bouchard M.D.   On:  11/22/2022 14:20     Medications:    anticoagulant sodium citrate      amLODipine  10 mg Oral Daily   carvedilol  25 mg Oral BID WC   Chlorhexidine Gluconate Cloth  6 each Topical Q0600   heparin  5,000 Units Subcutaneous Q8H   heparin sodium (porcine)       influenza vac split quadrivalent PF  0.5 mL Intramuscular Tomorrow-1000   insulin aspart  0-5 Units Subcutaneous QHS   insulin aspart  0-6 Units Subcutaneous TID WC   losartan  100 mg Oral Daily   pneumococcal 20-valent conjugate vaccine  0.5 mL Intramuscular Tomorrow-1000   acetaminophen **OR** acetaminophen, alteplase, anticoagulant sodium citrate, heparin, heparin sodium (porcine), ipratropium-albuterol, lidocaine (PF), lidocaine-prilocaine, nicotine, ondansetron **OR** ondansetron (ZOFRAN) IV, pentafluoroprop-tetrafluoroeth, senna-docusate  Assessment/ Plan:  Mr. Carley Strickling. is a 56 y.o.  male   Principal Problem:   Shortness of breath Active Problems:   Tobacco abuse   Hypertension associated with diabetes ([place])   Morbid obesity (HCC)   OSA (obstructive sleep apnea)   Statin intolerance   Hypertriglyceridemia   Nonadherence to medication   Mild cognitive impairment   HLD (hyperlipidemia)   Type II diabetes mellitus with renal manifestations ([place])   Depression with anxiety   ESRD on hemodialysis (HCC)   Polysubstance abuse (HCC)   Elevated troponin   Volume overload     End Stage Renal Disease on hemodialysis: Stable dialysis via permacath.  Potassium [facility] vascular lab)  Date Value Ref Range Status  07/16/2022 4 3.5 - 5.1 mmol/L Final    Comment:    Performed at [facility], [address] [place], [place] [address]    Intake/Output Summary (Last 24 hours) at 11/24/2022 1202 Last data filed at 11/23/2022 1624 Gross per 24 hour  Intake --  Output 2 ml  Net -2 ml    2. Hypertension with chronic kidney disease: Blood pressure still marginally controlled.  Continue amlodipine,  carvedilol and losartan.   BP (!) 181/94 (BP Location: Right Arm)   Pulse 73   Temp 98.6 F (37 C) (Oral)   Resp (!) 22   Ht 5' 7.5" (1.715 m)   Wt 103.9 kg   SpO2 100%   BMI 35.35 kg/m   3. Anemia of chronic kidney disease/ kidney injury/chronic disease/acute blood loss:   Lab Results  Component Value Date   HGB 11.5 (L) 11/23/2022   Will continue anemia protocol.  4. Secondary Hyperparathyroidism:     Lab Results  Component Value Date   PTH 135 (H) 12/07/2021   PTH Comment 12/07/2021   CALCIUM 8.3 (L) 11/24/2022   PHOS 5.8 (H) 11/23/2022   Will continue to monitor closely.  Patient needs binders.  Stable from renal standpoint for discharge today.     LOS: [place], MD [facility] 12/16/202312:02 PM

## 2022-11-24 NOTE — Progress Notes (Signed)
Pt HD restarted after 1 hour cathflo dwell. No complications. Catheter functioning well.

## 2022-11-24 NOTE — Progress Notes (Signed)
Catheter high vp alarms. Lines reversed.

## 2022-11-26 ENCOUNTER — Telehealth: Payer: Self-pay

## 2022-11-26 ENCOUNTER — Ambulatory Visit: Payer: Medicare Other | Admitting: Family

## 2022-11-26 NOTE — Patient Outreach (Signed)
  Care Coordination [facility] Note Transition Care Management Follow-up Telephone Call Date of discharge and from where: 11/24/22- Dx" acute pulmonary edema" How have you been since you were released from the hospital? Call completed with caregiver/sister-[name]. She manages patient's medical care. States she just left from visiting with patient a short a while ago and he was doing good. He has complained of no issues or concerns since returning home. He is eating well. She states that he continues to drink alcohol which she knows is not helping his medical needs but patient refuses to quit. He has dialysis tx on tomorrow.  Any questions or concerns? No  Items Reviewed: Did the pt receive and understand the discharge instructions provided? Yes  Medications obtained and verified?  Caregiver does not have meds or med list with her at present. Reviewed discontinued meds and she will review paperwork and remove meds from med planner that she fills for patient. She is concerned about patient stopping Atrax-states he takes it more so for nerves than itching. She will discuss with MD.  Other? Yes  Any new allergies since your discharge? No  Dietary orders reviewed? Yes Do you have support at home? Yes   Home Care and Equipment/Supplies: Were home health services ordered? not applicable If so, what is the name of the agency? N/A  Has the agency set up a time to come to the patient's home? not applicable Were any new equipment or medical supplies ordered?  No What is the name of the medical supply agency? N/A Were you able to get the supplies/equipment? not applicable Do you have any questions related to the use of the equipment or supplies? No  Functional Questionnaire: (I = Independent and D = Dependent) ADLs: I  Bathing/Dressing- I  Meal Prep- I  Eating- I  Maintaining continence- I  Transferring/Ambulation- I  Managing Meds- A  Follow up appointments reviewed:  PCP Hospital f/u appt  confirmed? Yes  Scheduled to see Lawernce Ion on 12/17/22 @ 2:40pm. [facility] f/u appt confirmed? Yes  Scheduled to see Lysle Morales on 12/17/22 @ 3:30pm. Are transportation arrangements needed? No  If their condition worsens, is the pt aware to call PCP or go to the Emergency Dept.? Yes Was the patient provided with contact information for the PCP's office or ED? Yes Was to pt encouraged to call back with questions or concerns? Yes  SDOH assessments and interventions completed:   Yes SDOH Interventions Today    Flowsheet Row Most Recent Value  SDOH Interventions   Food Insecurity Interventions Intervention Not Indicated  Transportation Interventions Intervention Not Indicated       Care Coordination Interventions:  Education provided    Encounter Outcome:  Pt. Visit Completed    Enzo [place], RN,BSN,CCM [facility] Telephonic Care Management Coordinator Direct Phone: [phone] Toll Free: [phone] Fax: [phone]

## 2022-12-13 ENCOUNTER — Telehealth (INDEPENDENT_AMBULATORY_CARE_PROVIDER_SITE_OTHER): Payer: Self-pay

## 2022-12-13 NOTE — Telephone Encounter (Signed)
I attempted to contact the patient to schedule a permcath removal. A message was left for a return call. 

## 2022-12-16 NOTE — Progress Notes (Deleted)
Patient ID: [name], male    DOB: 1965/12/16, 57 y.o.   MRN: [id number]  HPI  Mr Grieshop is a 57 y/o male with a history of asthma, DM, hyperlipidemia, HTN, CKD/ ESRD (dialysis), anxiety, brain aneurysm, GERD, MRSA, sleep apnea, panic disorder, subarachnoid hemorrhage, tobacco use and chronic heart failure.   Echo report from 06/23/22 reviewed and showed an EF of 60-65% along with mild LVH.   Admitted 11/22/22 due to acute pulmonary edema. Discharged after 2 days. Admitted 09/06/22 after a fall. Admitted 06/23/22 due to SOB and chest pain. BP elevated and hypoxic. Initially given IV lasix with transition to oral diuretics. Cardiology, nephrology and vascular consults obtained. Permacath placed and dialysis started. Unable to be weaned off oxygen. Discharged after 9 days.   He presents today for a follow-up visit with a chief complaint of   Review of Systems  Constitutional:  Positive for fatigue. Negative for appetite change.  HENT:  Negative for congestion, postnasal drip, sneezing and sore throat.   Eyes: Negative.   Respiratory:  Positive for cough (productive) and shortness of breath.   Cardiovascular:  Positive for chest pain (with cramping), palpitations (sometimes) and leg swelling.  Gastrointestinal:  Negative for abdominal distention and abdominal pain.  Endocrine: Negative.   Genitourinary: Negative.   Musculoskeletal:  Positive for back pain.  Skin: Negative.   Allergic/Immunologic: Negative.   Neurological:  Positive for dizziness and light-headedness.  Hematological:  Negative for adenopathy. Bruises/bleeds easily.  Psychiatric/Behavioral:  Positive for decreased concentration (since his brain hemorrhage) and sleep disturbance (chronic difficulty due to chronic pain). The patient is not nervous/anxious.      Physical Exam Vitals and nursing note reviewed. Exam conducted with a chaperone present (sister).  Constitutional:      Appearance: Normal appearance.   HENT:     Head: Normocephalic and atraumatic.  Cardiovascular:     Rate and Rhythm: Normal rate and regular rhythm.  Pulmonary:     Effort: Pulmonary effort is normal.     Breath sounds: No wheezing or rales.  Abdominal:     General: There is no distension.     Palpations: Abdomen is soft.     Tenderness: There is no abdominal tenderness.  Musculoskeletal:        General: No tenderness.     Cervical back: Normal range of motion and neck supple.     Right lower leg: Edema (1+ pitting) present.     Left lower leg: Edema (1+ pitting) present.  Skin:    General: Skin is warm and dry.     Findings: Erythema (left lower arm where fistula is) present.     Comments: Vascular access right upper chest  Neurological:     General: No focal deficit present.     Mental Status: He is alert and oriented to person, place, and time.  Psychiatric:        Mood and Affect: Mood normal.        Behavior: Behavior normal.     Assessment & Plan:  1: Chronic heart failure with preserved ejection fraction with structural changes (LVH)- - NYHA class III - euvolemic today - weighing every other day at dialysis - weight 233 pounds  - not adding salt to his food - doesn't take his diuretic on dialysis days - saw cardiology Kathlen Mody) 07/19/22 - encouraged to get compression socks and put them on the mornings and remove at bedtime - BNP 11/22/22 was 625.3  2: HTN- -  BP  - saw PCP Julian Hy) 07/17/22 - BMP 11/24/22 reviewed and showed sodium 137, potassium 4.0, creatinine 4.87 and GFR 13  3: CKD / ESRD- - dialysis T, TH, Sat - saw vascular Owens Shark) 08/15/22 - does get cramping in his legs, abdomen and up into his chest at times during dialysis - has temporary vascular access right upper chest as left lower arm fistula is trying to be used - left forearm has swelling and bruising noted  4: DM- - A1c 06/23/22 was 5.3%  5: COPD- - currently smoking daily - wearing oxygen at 3L around the clock; does turn  off the oxygen and goes to a different room to smoke - no longer drinks alcohol or uses drugs   Medication list reviewed.

## 2022-12-17 ENCOUNTER — Telehealth: Payer: Self-pay | Admitting: Family

## 2022-12-17 ENCOUNTER — Ambulatory Visit: Payer: Medicare Other | Admitting: Nurse Practitioner

## 2022-12-17 ENCOUNTER — Ambulatory Visit: Payer: Medicare Other | Admitting: Family

## 2022-12-17 NOTE — Telephone Encounter (Signed)
Patient did not show for his [facility] appointment on 12/17/22. Will attempt to reschedule.

## 2022-12-17 NOTE — Progress Notes (Deleted)
There were no vitals taken for this visit.   Subjective:    Patient ID: Derek Binet., male    DOB: 1965/12/13, 57 y.o.   MRN: [id number]  HPI: Derek Fleet. is a 57 y.o. male  No chief complaint on file.  HYPERTENSION / HYPERLIPIDEMIA Satisfied with current treatment? no Duration of hypertension: years BP monitoring frequency: not checking BP range:  BP medication side effects: no Past BP meds: lisinopril Duration of hyperlipidemia: years Cholesterol medication side effects: no Cholesterol supplements: none Past cholesterol medications: none Medication compliance: excellent compliance Aspirin: no Recent stressors: no Recurrent headaches: no Visual changes: no Palpitations: no Dyspnea: no Chest pain: no Lower extremity edema: no Dizzy/lightheaded: no  DIABETES Hypoglycemic episodes:{Blank single:19197::"yes","no"} Polydipsia/polyuria: {Blank single:19197::"yes","no"} Visual disturbance: {Blank single:19197::"yes","no"} Chest pain: {Blank single:19197::"yes","no"} Paresthesias: {Blank single:19197::"yes","no"} Glucose Monitoring: {Blank single:19197::"yes","no"}  Accucheck frequency: {Blank single:19197::"Not Checking","Daily","BID","TID"}  Fasting glucose:  Post prandial:  Evening:  Before meals: Taking Insulin?: {Blank single:19197::"yes","no"}  Long acting insulin:  Short acting insulin: Blood Pressure Monitoring: {Blank single:19197::"not checking","rarely","daily","weekly","monthly","a few times a day","a few times a week","a few times a month"} Retinal Examination: {Blank single:19197::"Up to Date","Not up to Date"} Foot Exam: {Blank single:19197::"Up to Date","Not up to Date"} Diabetic Education: {Blank single:19197::"Completed","Not Completed"} Pneumovax: {Blank single:19197::"Up to Date","Not up to Date","unknown"} Influenza: {Blank single:19197::"Up to Date","Not up to Date","unknown"} Aspirin: {Blank single:19197::"yes","no"}   CHRONIC  KIDNEY DISEASE CKD status: controlled Medications renally dose: yes Previous renal evaluation: yes Pneumovax:  up to date Influenza Vaccine:  Not up to Date   Relevant past medical, surgical, family and social history reviewed and updated as indicated. Interim medical history since our last visit reviewed. Allergies and medications reviewed and updated.  Review of Systems  Eyes:  Negative for visual disturbance.  Respiratory:  Negative for chest tightness and shortness of breath.   Cardiovascular:  Negative for chest pain, palpitations and leg swelling.  Endocrine: Negative for polydipsia and polyuria.  Neurological:  Negative for dizziness, light-headedness, numbness and headaches.    Per HPI unless specifically indicated above     Objective:    There were no vitals taken for this visit.  Wt Readings from Last 3 Encounters:  11/24/22 218 lb 4.1 oz (99 kg)  10/16/22 230 lb (104.3 kg)  09/08/22 216 lb 11.4 oz (98.3 kg)    Physical Exam Vitals and nursing note reviewed.  Constitutional:      General: He is not in acute distress.    Appearance: Normal appearance. He is not ill-appearing, toxic-appearing or diaphoretic.  HENT:     Head: Normocephalic.     Right Ear: External ear normal.     Left Ear: External ear normal.     Nose: Nose normal. No congestion or rhinorrhea.     Mouth/Throat:     Mouth: Mucous membranes are moist.  Eyes:     General:        Right eye: No discharge.        Left eye: No discharge.     Extraocular Movements: Extraocular movements intact.     Conjunctiva/sclera: Conjunctivae normal.     Pupils: Pupils are equal, round, and reactive to light.  Cardiovascular:     Rate and Rhythm: Normal rate and regular rhythm.     Heart sounds: No murmur heard. Pulmonary:     Effort: Pulmonary effort is normal. No respiratory distress.     Breath sounds: Normal breath sounds. No wheezing, rhonchi or rales.  Abdominal:  General: Abdomen is flat. Bowel  sounds are normal.  Musculoskeletal:     Cervical back: Normal range of motion and neck supple.  Skin:    General: Skin is warm and dry.     Capillary Refill: Capillary refill takes less than 2 seconds.  Neurological:     General: No focal deficit present.     Mental Status: He is alert and oriented to person, place, and time.  Psychiatric:        Mood and Affect: Mood normal.        Behavior: Behavior normal.        Thought Content: Thought content normal.        Judgment: Judgment normal.     Results for orders placed or performed during the hospital encounter of 11/22/22  Resp panel by RT-PCR (RSV, Flu A&B, Covid) Anterior Nasal Swab   Specimen: Anterior Nasal Swab  Result Value Ref Range   SARS Coronavirus 2 by RT PCR NEGATIVE NEGATIVE   Influenza A by PCR NEGATIVE NEGATIVE   Influenza B by PCR NEGATIVE NEGATIVE   Resp Syncytial Virus by PCR NEGATIVE NEGATIVE  Comprehensive metabolic panel  Result Value Ref Range   Sodium 136 135 - 145 mmol/L   Potassium 4.9 3.5 - 5.1 mmol/L   Chloride 111 98 - 111 mmol/L   CO2 22 22 - 32 mmol/L   Glucose, Bld 139 (H) 70 - 99 mg/dL   BUN 54 (H) 6 - 20 mg/dL   Creatinine, Ser 5.38 (H) 0.61 - 1.24 mg/dL   Calcium 8.5 (L) 8.9 - 10.3 mg/dL   Total Protein 6.9 6.5 - 8.1 g/dL   Albumin 3.4 (L) 3.5 - 5.0 g/dL   AST 17 15 - 41 U/L   ALT 18 0 - 44 U/L   Alkaline Phosphatase 108 38 - 126 U/L   Total Bilirubin 1.3 (H) 0.3 - 1.2 mg/dL   GFR, Estimated 12 (L) >60 mL/min   Anion gap 3 (L) 5 - 15  CBC with Differential  Result Value Ref Range   WBC 12.4 (H) 4.0 - 10.5 K/uL   RBC 3.85 (L) 4.22 - 5.81 MIL/uL   Hemoglobin 11.6 (L) 13.0 - 17.0 g/dL   HCT 35.8 (L) 39.0 - 52.0 %   MCV 93.0 80.0 - 100.0 fL   MCH 30.1 26.0 - 34.0 pg   MCHC 32.4 30.0 - 36.0 g/dL   RDW 13.4 11.5 - 15.5 %   Platelets 217 150 - 400 K/uL   nRBC 0.0 0.0 - 0.2 %   Neutrophils Relative % 82 %   Neutro Abs 10.3 (H) 1.7 - 7.7 K/uL   Lymphocytes Relative 10 %   Lymphs Abs  1.2 0.7 - 4.0 K/uL   Monocytes Relative 5 %   Monocytes Absolute 0.6 0.1 - 1.0 K/uL   Eosinophils Relative 2 %   Eosinophils Absolute 0.3 0.0 - 0.5 K/uL   Basophils Relative 0 %   Basophils Absolute 0.1 0.0 - 0.1 K/uL   Immature Granulocytes 1 %   Abs Immature Granulocytes 0.07 0.00 - 0.07 K/uL  Magnesium  Result Value Ref Range   Magnesium 2.1 1.7 - 2.4 mg/dL  Phosphorus  Result Value Ref Range   Phosphorus 4.7 (H) 2.5 - 4.6 mg/dL  Brain natriuretic peptide  Result Value Ref Range   B Natriuretic Peptide 625.3 (H) 0.0 - 100.0 pg/mL  Magnesium  Result Value Ref Range   Magnesium 2.2 1.7 - 2.4 mg/dL  Phosphorus  Result Value Ref Range   Phosphorus 5.8 (H) 2.5 - 4.6 mg/dL  Basic metabolic panel  Result Value Ref Range   Sodium 142 135 - 145 mmol/L   Potassium 4.7 3.5 - 5.1 mmol/L   Chloride 110 98 - 111 mmol/L   CO2 23 22 - 32 mmol/L   Glucose, Bld 135 (H) 70 - 99 mg/dL   BUN 63 (H) 6 - 20 mg/dL   Creatinine, Ser 5.91 (H) 0.61 - 1.24 mg/dL   Calcium 8.3 (L) 8.9 - 10.3 mg/dL   GFR, Estimated 10 (L) >60 mL/min   Anion gap 9 5 - 15  CBC  Result Value Ref Range   WBC 9.8 4.0 - 10.5 K/uL   RBC 3.53 (L) 4.22 - 5.81 MIL/uL   Hemoglobin 10.8 (L) 13.0 - 17.0 g/dL   HCT 33.1 (L) 39.0 - 52.0 %   MCV 93.8 80.0 - 100.0 fL   MCH 30.6 26.0 - 34.0 pg   MCHC 32.6 30.0 - 36.0 g/dL   RDW 13.3 11.5 - 15.5 %   Platelets 227 150 - 400 K/uL   nRBC 0.0 0.0 - 0.2 %  Hepatitis B surface antibody,quantitative  Result Value Ref Range   Hep B S AB Quant (Post) <3.1 (L) Immunity>9.9 mIU/mL  Hepatitis B surface antigen  Result Value Ref Range   Hepatitis B Surface Ag NON REACTIVE NON REACTIVE  Glucose, capillary  Result Value Ref Range   Glucose-Capillary 122 (H) 70 - 99 mg/dL   Comment 1 Notify RN   CBC  Result Value Ref Range   WBC 9.9 4.0 - 10.5 K/uL   RBC 3.79 (L) 4.22 - 5.81 MIL/uL   Hemoglobin 11.5 (L) 13.0 - 17.0 g/dL   HCT 34.9 (L) 39.0 - 52.0 %   MCV 92.1 80.0 - 100.0 fL    MCH 30.3 26.0 - 34.0 pg   MCHC 33.0 30.0 - 36.0 g/dL   RDW 13.2 11.5 - 15.5 %   Platelets 226 150 - 400 K/uL   nRBC 0.0 0.0 - 0.2 %  Basic metabolic panel  Result Value Ref Range   Sodium 137 135 - 145 mmol/L   Potassium 4.0 3.5 - 5.1 mmol/L   Chloride 106 98 - 111 mmol/L   CO2 26 22 - 32 mmol/L   Glucose, Bld 108 (H) 70 - 99 mg/dL   BUN 46 (H) 6 - 20 mg/dL   Creatinine, Ser 4.87 (H) 0.61 - 1.24 mg/dL   Calcium 8.3 (L) 8.9 - 10.3 mg/dL   GFR, Estimated 13 (L) >60 mL/min   Anion gap 5 5 - 15  Glucose, capillary  Result Value Ref Range   Glucose-Capillary 138 (H) 70 - 99 mg/dL  Glucose, capillary  Result Value Ref Range   Glucose-Capillary 117 (H) 70 - 99 mg/dL   Comment 1 Notify RN   Glucose, capillary  Result Value Ref Range   Glucose-Capillary 92 70 - 99 mg/dL  CBG monitoring, ED  Result Value Ref Range   Glucose-Capillary 124 (H) 70 - 99 mg/dL  Troponin I (High Sensitivity)  Result Value Ref Range   Troponin I (High Sensitivity) 149 (HH) <18 ng/L  Troponin I (High Sensitivity)  Result Value Ref Range   Troponin I (High Sensitivity) 124 (HH) <18 ng/L      Assessment & Plan:   Problem List Items Addressed This Visit       Cardiovascular and Mediastinum   Hypertension associated with diabetes ([place])   Chronic  heart failure with preserved ejection fraction (HFpEF) (HCC) - Primary     Respiratory   Chronic obstructive pulmonary disease (COPD) ([place])     Endocrine   Type II diabetes mellitus with renal manifestations (HCC)     Genitourinary   CKD (chronic kidney disease), stage V (HCC)   Anemia in CKD (chronic kidney disease)   ESRD on hemodialysis ([place])     Other   Hypertriglyceridemia   Depression with anxiety     Follow up plan: No follow-ups on file.   A total of 30 minutes were spent on this encounter today.  When total time is documented, this includes both the face-to-face and non-face-to-face time personally spent before, during and after the visit  on the date of the encounter reviewing CKD and anemia.

## 2022-12-24 ENCOUNTER — Ambulatory Visit: Payer: Self-pay

## 2022-12-24 ENCOUNTER — Telehealth: Payer: Self-pay

## 2022-12-24 NOTE — Telephone Encounter (Signed)
Transition Care Management Unsuccessful Follow-up Telephone Call  Date of discharge and from where:  [facility] 1.12.24  Attempts:  1st Attempt  Reason for unsuccessful TCM follow-up call:  Left voice message

## 2022-12-24 NOTE — Telephone Encounter (Signed)
  Chief Complaint: BP 220/110 Symptoms: denies Frequency: pt stated has been elevated fr 2 month Pertinent Negatives: Patient denies blurred vision, chest pain, difficulty breathing, headache, weakness Disposition: [] ED /[] Urgent Care (no appt availability in office) / [] Appointment(In office/virtual)/ []  [place] [facility]/ [] Home Care/ [] Refused Recommended Disposition /[] [place] [facility]/ [x]  Follow-up with PCP Additional Notes:    [facility] nurse reported BP of 220/110. Called pt and denies any sx. Davina requesting: If BP SBP >180 will call and report >90 DBP- [phone]. Call if parameters change. Before calling pt, was asked to reschedule hospital f/u visit. Advised that canceled appt in 3 days due to HD. Please advise. Did not recommend UC due to lack of sx and hx. Pt had not taken BP med today. Advised pt to take med.  Reason for Disposition  [2] Systolic BP  >= 595 OR Diastolic >= 638 AND [7] having NO cardiac or neurologic symptoms  Answer Assessment - Initial Assessment Questions 1. BLOOD PRESSURE: "What is the blood pressure?" "Did you take at least two measurements 5 minutes apart?"     2 months  2. ONSET: "When did you take your blood pressure?"     This am  3. HOW: "How did you take your blood pressure?" (e.g., automatic home BP monitor, visiting nurse)     Visiting nurse 4. HISTORY: "Do you have a history of high blood pressure?"     yes 5. MEDICINES: "Are you taking any medicines for blood pressure?" "Have you missed any doses recently?"     Yes- missed am  6. OTHER SYMPTOMS: "Do you have any symptoms?" (e.g., blurred vision, chest pain, difficulty breathing, headache, weakness)     no 7. PREGNANCY: "Is there any chance you are pregnant?" "When was your last menstrual period?"     N/a  Protocols used: Blood Pressure - High-A-AH

## 2022-12-24 NOTE — Telephone Encounter (Signed)
FYI

## 2022-12-25 NOTE — Telephone Encounter (Signed)
Transition Care Management Unsuccessful Follow-up Telephone Call  Date of discharge and from where:  [facility] 1.12.24  Attempts:  2nd Attempt  Reason for unsuccessful TCM follow-up call:  Left voice message

## 2022-12-27 ENCOUNTER — Inpatient Hospital Stay: Payer: Medicare Other | Admitting: Physician Assistant

## 2022-12-28 NOTE — Telephone Encounter (Signed)
Transition Care Management Unsuccessful Follow-up Telephone Call  Date of discharge and from where:  [facility] 1.12.24  Attempts:  3rd Attempt  Reason for unsuccessful TCM follow-up call:  Left voice message

## 2023-01-01 ENCOUNTER — Telehealth (INDEPENDENT_AMBULATORY_CARE_PROVIDER_SITE_OTHER): Payer: Self-pay

## 2023-01-01 NOTE — Telephone Encounter (Signed)
I attempted to contact the patient to schedule a permcath removal. A message was left for a return call. 

## 2023-01-02 ENCOUNTER — Inpatient Hospital Stay: Payer: Medicare Other | Admitting: Nurse Practitioner

## 2023-01-02 NOTE — Progress Notes (Deleted)
There were no vitals taken for this visit.   Subjective:    Patient ID: Derek Binet., male    DOB: 05/06/1966, 56 y.o.   MRN: [id number]  HPI: Derek Merrifield. is a 57 y.o. male  No chief complaint on file.  Transition of Care Hospital Follow up.   Hospital/Facility: D/C Physician:  D/C Date:   Records Requested:  Records Received:  Records Reviewed:   Diagnoses on Discharge:   Date of interactive Contact within 48 hours of discharge:  Contact was through: {Blank single:19197::"phone","e-mail","direct","other"}  Date of 7 day or 14 day face-to-face visit:    {Blank single:19197::"within 7 days","within 14 days"}  Outpatient Encounter Medications as of 01/02/2023  Medication Sig   albuterol (VENTOLIN HFA) 108 (90 Base) MCG/ACT inhaler Inhale 1-2 puffs into the lungs every 6 (six) hours as needed for wheezing or shortness of breath.   amLODipine (NORVASC) 10 MG tablet TAKE 1 TABLET(10 MG) BY MOUTH DAILY   aspirin EC 81 MG tablet Take 1 tablet (81 mg total) by mouth daily. Swallow whole.   carvedilol (COREG) 25 MG tablet TAKE 1 TABLET(25 MG) BY MOUTH TWICE DAILY WITH A MEAL   DULoxetine (CYMBALTA) 60 MG capsule Take 1 capsule (60 mg total) by mouth daily.   glucose blood (ONETOUCH ULTRA) test strip USE TO TEST BLOOD SUGAR DAILY   hydrALAZINE (APRESOLINE) 100 MG tablet Take 100 mg by mouth 2 (two) times daily.   lidocaine-prilocaine (EMLA) cream Apply 1 Application topically daily.   losartan (COZAAR) 100 MG tablet Take 100 mg by mouth daily.   multivitamin (RENA-VIT) TABS tablet Take 1 tablet by mouth daily.   nicotine (NICODERM CQ - DOSED IN MG/24 HOURS) 21 mg/24hr patch Place 1 patch (21 mg total) onto the skin daily as needed (nicotine craving).   OXYGEN Inhale 3 L into the lungs daily.   pantoprazole (PROTONIX) 40 MG tablet TAKE 1 TABLET(40 MG) BY MOUTH TWICE DAILY AS NEEDED   topiramate (TOPAMAX) 50 MG tablet TAKE 1 TABLET(50 MG) BY MOUTH TWICE DAILY    torsemide (DEMADEX) 20 MG tablet TAKE 2 TABLETS(40 MG) BY MOUTH DAILY   No facility-administered encounter medications on file as of 01/02/2023.    Diagnostic Tests Reviewed/Disposition:   Consults:  Discharge Instructions  Disease/illness Education:  Home Health/Community Services Discussions/Referrals:  Establishment or re-establishment of referral orders for community resources:  Discussion with other health care providers:  Assessment and Support of treatment regimen adherence:  Appointments Coordinated with:   Education for self-management, independent living, and ADLs:   HYPERTENSION / HYPERLIPIDEMIA Satisfied with current treatment? no Duration of hypertension: years BP monitoring frequency: not checking BP range:  BP medication side effects: no Past BP meds: lisinopril Duration of hyperlipidemia: years Cholesterol medication side effects: no Cholesterol supplements: none Past cholesterol medications: none Medication compliance: excellent compliance Aspirin: no Recent stressors: no Recurrent headaches: no Visual changes: no Palpitations: no Dyspnea: no Chest pain: no Lower extremity edema: no Dizzy/lightheaded: no  DIABETES Hypoglycemic episodes:{Blank single:19197::"yes","no"} Polydipsia/polyuria: {Blank single:19197::"yes","no"} Visual disturbance: {Blank single:19197::"yes","no"} Chest pain: {Blank single:19197::"yes","no"} Paresthesias: {Blank single:19197::"yes","no"} Glucose Monitoring: {Blank single:19197::"yes","no"}  Accucheck frequency: {Blank single:19197::"Not Checking","Daily","BID","TID"}  Fasting glucose:  Post prandial:  Evening:  Before meals: Taking Insulin?: {Blank single:19197::"yes","no"}  Long acting insulin:  Short acting insulin: Blood Pressure Monitoring: {Blank single:19197::"not checking","rarely","daily","weekly","monthly","a few times a day","a few times a week","a few times a month"} Retinal Examination: {Blank  single:19197::"Up to Date","Not up to Date"} Foot Exam: {Blank single:19197::"Up to Date","Not up  to Date"} Diabetic Education: {Blank single:19197::"Completed","Not Completed"} Pneumovax: {Blank single:19197::"Up to Date","Not up to Date","unknown"} Influenza: {Blank single:19197::"Up to Date","Not up to Date","unknown"} Aspirin: {Blank single:19197::"yes","no"}   CHRONIC KIDNEY DISEASE CKD status: controlled Medications renally dose: yes Previous renal evaluation: yes Pneumovax:  up to date Influenza Vaccine:  Not up to Date   Relevant past medical, surgical, family and social history reviewed and updated as indicated. Interim medical history since our last visit reviewed. Allergies and medications reviewed and updated.  Review of Systems  Eyes:  Negative for visual disturbance.  Respiratory:  Negative for chest tightness and shortness of breath.   Cardiovascular:  Negative for chest pain, palpitations and leg swelling.  Endocrine: Negative for polydipsia and polyuria.  Neurological:  Negative for dizziness, light-headedness, numbness and headaches.    Per HPI unless specifically indicated above     Objective:    There were no vitals taken for this visit.  Wt Readings from Last 3 Encounters:  11/24/22 218 lb 4.1 oz (99 kg)  10/16/22 230 lb (104.3 kg)  09/08/22 216 lb 11.4 oz (98.3 kg)    Physical Exam Vitals and nursing note reviewed.  Constitutional:      General: He is not in acute distress.    Appearance: Normal appearance. He is not ill-appearing, toxic-appearing or diaphoretic.  HENT:     Head: Normocephalic.     Right Ear: External ear normal.     Left Ear: External ear normal.     Nose: Nose normal. No congestion or rhinorrhea.     Mouth/Throat:     Mouth: Mucous membranes are moist.  Eyes:     General:        Right eye: No discharge.        Left eye: No discharge.     Extraocular Movements: Extraocular movements intact.     Conjunctiva/sclera:  Conjunctivae normal.     Pupils: Pupils are equal, round, and reactive to light.  Cardiovascular:     Rate and Rhythm: Normal rate and regular rhythm.     Heart sounds: No murmur heard. Pulmonary:     Effort: Pulmonary effort is normal. No respiratory distress.     Breath sounds: Normal breath sounds. No wheezing, rhonchi or rales.  Abdominal:     General: Abdomen is flat. Bowel sounds are normal.  Musculoskeletal:     Cervical back: Normal range of motion and neck supple.  Skin:    General: Skin is warm and dry.     Capillary Refill: Capillary refill takes less than 2 seconds.  Neurological:     General: No focal deficit present.     Mental Status: He is alert and oriented to person, place, and time.  Psychiatric:        Mood and Affect: Mood normal.        Behavior: Behavior normal.        Thought Content: Thought content normal.        Judgment: Judgment normal.     Results for orders placed or performed during the hospital encounter of 11/22/22  Resp panel by RT-PCR (RSV, Flu A&B, Covid) Anterior Nasal Swab   Specimen: Anterior Nasal Swab  Result Value Ref Range   SARS Coronavirus 2 by RT PCR NEGATIVE NEGATIVE   Influenza A by PCR NEGATIVE NEGATIVE   Influenza B by PCR NEGATIVE NEGATIVE   Resp Syncytial Virus by PCR NEGATIVE NEGATIVE  Comprehensive metabolic panel  Result Value Ref Range   Sodium 136 135 -  145 mmol/L   Potassium 4.9 3.5 - 5.1 mmol/L   Chloride 111 98 - 111 mmol/L   CO2 22 22 - 32 mmol/L   Glucose, Bld 139 (H) 70 - 99 mg/dL   BUN 54 (H) 6 - 20 mg/dL   Creatinine, Ser 5.38 (H) 0.61 - 1.24 mg/dL   Calcium 8.5 (L) 8.9 - 10.3 mg/dL   Total Protein 6.9 6.5 - 8.1 g/dL   Albumin 3.4 (L) 3.5 - 5.0 g/dL   AST 17 15 - 41 U/L   ALT 18 0 - 44 U/L   Alkaline Phosphatase 108 38 - 126 U/L   Total Bilirubin 1.3 (H) 0.3 - 1.2 mg/dL   GFR, Estimated 12 (L) >60 mL/min   Anion gap 3 (L) 5 - 15  CBC with Differential  Result Value Ref Range   WBC 12.4 (H) 4.0 -  10.5 K/uL   RBC 3.85 (L) 4.22 - 5.81 MIL/uL   Hemoglobin 11.6 (L) 13.0 - 17.0 g/dL   HCT 35.8 (L) 39.0 - 52.0 %   MCV 93.0 80.0 - 100.0 fL   MCH 30.1 26.0 - 34.0 pg   MCHC 32.4 30.0 - 36.0 g/dL   RDW 13.4 11.5 - 15.5 %   Platelets 217 150 - 400 K/uL   nRBC 0.0 0.0 - 0.2 %   Neutrophils Relative % 82 %   Neutro Abs 10.3 (H) 1.7 - 7.7 K/uL   Lymphocytes Relative 10 %   Lymphs Abs 1.2 0.7 - 4.0 K/uL   Monocytes Relative 5 %   Monocytes Absolute 0.6 0.1 - 1.0 K/uL   Eosinophils Relative 2 %   Eosinophils Absolute 0.3 0.0 - 0.5 K/uL   Basophils Relative 0 %   Basophils Absolute 0.1 0.0 - 0.1 K/uL   Immature Granulocytes 1 %   Abs Immature Granulocytes 0.07 0.00 - 0.07 K/uL  Magnesium  Result Value Ref Range   Magnesium 2.1 1.7 - 2.4 mg/dL  Phosphorus  Result Value Ref Range   Phosphorus 4.7 (H) 2.5 - 4.6 mg/dL  Brain natriuretic peptide  Result Value Ref Range   B Natriuretic Peptide 625.3 (H) 0.0 - 100.0 pg/mL  Magnesium  Result Value Ref Range   Magnesium 2.2 1.7 - 2.4 mg/dL  Phosphorus  Result Value Ref Range   Phosphorus 5.8 (H) 2.5 - 4.6 mg/dL  Basic metabolic panel  Result Value Ref Range   Sodium 142 135 - 145 mmol/L   Potassium 4.7 3.5 - 5.1 mmol/L   Chloride 110 98 - 111 mmol/L   CO2 23 22 - 32 mmol/L   Glucose, Bld 135 (H) 70 - 99 mg/dL   BUN 63 (H) 6 - 20 mg/dL   Creatinine, Ser 5.91 (H) 0.61 - 1.24 mg/dL   Calcium 8.3 (L) 8.9 - 10.3 mg/dL   GFR, Estimated 10 (L) >60 mL/min   Anion gap 9 5 - 15  CBC  Result Value Ref Range   WBC 9.8 4.0 - 10.5 K/uL   RBC 3.53 (L) 4.22 - 5.81 MIL/uL   Hemoglobin 10.8 (L) 13.0 - 17.0 g/dL   HCT 33.1 (L) 39.0 - 52.0 %   MCV 93.8 80.0 - 100.0 fL   MCH 30.6 26.0 - 34.0 pg   MCHC 32.6 30.0 - 36.0 g/dL   RDW 13.3 11.5 - 15.5 %   Platelets 227 150 - 400 K/uL   nRBC 0.0 0.0 - 0.2 %  Hepatitis B surface antibody,quantitative  Result Value Ref Range  Hep B S AB Quant (Post) <3.1 (L) Immunity>9.9 mIU/mL  Hepatitis B surface  antigen  Result Value Ref Range   Hepatitis B Surface Ag NON REACTIVE NON REACTIVE  Glucose, capillary  Result Value Ref Range   Glucose-Capillary 122 (H) 70 - 99 mg/dL   Comment 1 Notify RN   CBC  Result Value Ref Range   WBC 9.9 4.0 - 10.5 K/uL   RBC 3.79 (L) 4.22 - 5.81 MIL/uL   Hemoglobin 11.5 (L) 13.0 - 17.0 g/dL   HCT 34.9 (L) 39.0 - 52.0 %   MCV 92.1 80.0 - 100.0 fL   MCH 30.3 26.0 - 34.0 pg   MCHC 33.0 30.0 - 36.0 g/dL   RDW 13.2 11.5 - 15.5 %   Platelets 226 150 - 400 K/uL   nRBC 0.0 0.0 - 0.2 %  Basic metabolic panel  Result Value Ref Range   Sodium 137 135 - 145 mmol/L   Potassium 4.0 3.5 - 5.1 mmol/L   Chloride 106 98 - 111 mmol/L   CO2 26 22 - 32 mmol/L   Glucose, Bld 108 (H) 70 - 99 mg/dL   BUN 46 (H) 6 - 20 mg/dL   Creatinine, Ser 4.87 (H) 0.61 - 1.24 mg/dL   Calcium 8.3 (L) 8.9 - 10.3 mg/dL   GFR, Estimated 13 (L) >60 mL/min   Anion gap 5 5 - 15  Glucose, capillary  Result Value Ref Range   Glucose-Capillary 138 (H) 70 - 99 mg/dL  Glucose, capillary  Result Value Ref Range   Glucose-Capillary 117 (H) 70 - 99 mg/dL   Comment 1 Notify RN   Glucose, capillary  Result Value Ref Range   Glucose-Capillary 92 70 - 99 mg/dL  CBG monitoring, ED  Result Value Ref Range   Glucose-Capillary 124 (H) 70 - 99 mg/dL  Troponin I (High Sensitivity)  Result Value Ref Range   Troponin I (High Sensitivity) 149 (HH) <18 ng/L  Troponin I (High Sensitivity)  Result Value Ref Range   Troponin I (High Sensitivity) 124 (HH) <18 ng/L      Assessment & Plan:   Problem List Items Addressed This Visit   None    Follow up plan: No follow-ups on file.   A total of 30 minutes were spent on this encounter today.  When total time is documented, this includes both the face-to-face and non-face-to-face time personally spent before, during and after the visit on the date of the encounter reviewing CKD and anemia.

## 2023-01-05 ENCOUNTER — Emergency Department: Payer: Medicare Other

## 2023-01-05 ENCOUNTER — Emergency Department
Admission: EM | Admit: 2023-01-05 | Discharge: 2023-01-05 | Disposition: A | Discharge status: Home / Self Care | Payer: Medicare Other | Attending: Emergency Medicine | Admitting: Emergency Medicine

## 2023-01-05 DIAGNOSIS — N186 End stage renal disease: Secondary | ICD-10-CM | POA: Diagnosis not present

## 2023-01-05 DIAGNOSIS — Z9981 Dependence on supplemental oxygen: Secondary | ICD-10-CM | POA: Insufficient documentation

## 2023-01-05 DIAGNOSIS — Z992 Dependence on renal dialysis: Secondary | ICD-10-CM | POA: Diagnosis not present

## 2023-01-05 DIAGNOSIS — I120 Hypertensive chronic kidney disease with stage 5 chronic kidney disease or end stage renal disease: Secondary | ICD-10-CM | POA: Insufficient documentation

## 2023-01-05 DIAGNOSIS — J449 Chronic obstructive pulmonary disease, unspecified: Secondary | ICD-10-CM | POA: Diagnosis not present

## 2023-01-05 DIAGNOSIS — Z1152 Encounter for screening for COVID-19: Secondary | ICD-10-CM | POA: Insufficient documentation

## 2023-01-05 DIAGNOSIS — R0602 Shortness of breath: Secondary | ICD-10-CM

## 2023-01-05 DIAGNOSIS — Z8673 Personal history of transient ischemic attack (TIA), and cerebral infarction without residual deficits: Secondary | ICD-10-CM | POA: Insufficient documentation

## 2023-01-05 DIAGNOSIS — I959 Hypotension, unspecified: Secondary | ICD-10-CM | POA: Diagnosis not present

## 2023-01-05 DIAGNOSIS — J441 Chronic obstructive pulmonary disease with (acute) exacerbation: Secondary | ICD-10-CM

## 2023-01-05 LAB — COMPREHENSIVE METABOLIC PANEL
ALT: 13 U/L (ref 0–44)
AST: 16 U/L (ref 15–41)
Albumin: 3.2 g/dL — ABNORMAL LOW (ref 3.5–5.0)
Alkaline Phosphatase: 81 U/L (ref 38–126)
Anion gap: 11 (ref 5–15)
BUN: 36 mg/dL — ABNORMAL HIGH (ref 6–20)
CO2: 26 mmol/L (ref 22–32)
Calcium: 7.9 mg/dL — ABNORMAL LOW (ref 8.9–10.3)
Chloride: 98 mmol/L (ref 98–111)
Creatinine, Ser: 4.52 mg/dL — ABNORMAL HIGH (ref 0.61–1.24)
GFR, Estimated: 14 mL/min — ABNORMAL LOW (ref >60)
Glucose, Bld: 133 mg/dL — ABNORMAL HIGH (ref 70–99)
Potassium: 3.9 mmol/L (ref 3.5–5.1)
Sodium: 135 mmol/L (ref 135–145)
Total Bilirubin: 0.7 mg/dL (ref 0.3–1.2)
Total Protein: 6.3 g/dL — ABNORMAL LOW (ref 6.5–8.1)

## 2023-01-05 LAB — CBC WITH DIFFERENTIAL/PLATELET
Abs Immature Granulocytes: 0.03 10*3/uL (ref 0.00–0.07)
Basophils Absolute: 0 10*3/uL (ref 0.0–0.1)
Basophils Relative: 1 %
Eosinophils Absolute: 0.2 10*3/uL (ref 0.0–0.5)
Eosinophils Relative: 3 %
HCT: 29.2 % — ABNORMAL LOW (ref 39.0–52.0)
Hemoglobin: 9.6 g/dL — ABNORMAL LOW (ref 13.0–17.0)
Immature Granulocytes: 0 %
Lymphocytes Relative: 21 %
Lymphs Abs: 1.5 10*3/uL (ref 0.7–4.0)
MCH: 30.4 pg (ref 26.0–34.0)
MCHC: 32.9 g/dL (ref 30.0–36.0)
MCV: 92.4 fL (ref 80.0–100.0)
Monocytes Absolute: 0.6 10*3/uL (ref 0.1–1.0)
Monocytes Relative: 9 %
Neutro Abs: 4.6 10*3/uL (ref 1.7–7.7)
Neutrophils Relative %: 66 %
Platelets: 236 10*3/uL (ref 150–400)
RBC: 3.16 MIL/uL — ABNORMAL LOW (ref 4.22–5.81)
RDW: 13.5 % (ref 11.5–15.5)
WBC: 7 10*3/uL (ref 4.0–10.5)
nRBC: 0 % (ref 0.0–0.2)

## 2023-01-05 LAB — RESP PANEL BY RT-PCR (RSV, FLU A&B, COVID)  RVPGX2
Influenza A by PCR: NEGATIVE
Influenza B by PCR: NEGATIVE
Resp Syncytial Virus by PCR: NEGATIVE
SARS Coronavirus 2 by RT PCR: NEGATIVE

## 2023-01-05 LAB — BLOOD GAS, VENOUS
Acid-Base Excess: 7.5 mmol/L — ABNORMAL HIGH (ref 0.0–2.0)
Bicarbonate: 33.2 mmol/L — ABNORMAL HIGH (ref 20.0–28.0)
O2 Saturation: 91.4 %
Patient temperature: 37
pCO2, Ven: 50 mmHg (ref 44–60)
pH, Ven: 7.43 (ref 7.25–7.43)
pO2, Ven: 59 mmHg — ABNORMAL HIGH (ref 32–45)

## 2023-01-05 LAB — TROPONIN I (HIGH SENSITIVITY)
Troponin I (High Sensitivity): 35 ng/L — ABNORMAL HIGH (ref <18)
Troponin I (High Sensitivity): 30 ng/L — ABNORMAL HIGH (ref <18)

## 2023-01-05 LAB — BRAIN NATRIURETIC PEPTIDE: B Natriuretic Peptide: 136.4 pg/mL — ABNORMAL HIGH (ref 0.0–100.0)

## 2023-01-05 LAB — LACTIC ACID, PLASMA: Lactic Acid, Venous: 1 mmol/L (ref 0.5–1.9)

## 2023-01-05 LAB — D-DIMER, QUANTITATIVE: D-Dimer, Quant: 1.29 ug/mL-FEU — ABNORMAL HIGH (ref 0.00–0.50)

## 2023-01-05 MED ORDER — PREDNISONE 20 MG PO TABS
60.0000 mg | ORAL_TABLET | Freq: Once | ORAL | Status: AC
  Administered 2023-01-05: 60 mg via ORAL
  Filled 2023-01-05: qty 3

## 2023-01-05 MED ORDER — PREDNISONE 10 MG (21) PO TBPK: ORAL_TABLET | ORAL | 0 refills | Status: DC

## 2023-01-05 MED ORDER — IOHEXOL 350 MG/ML SOLN
75.0000 mL | Freq: Once | INTRAVENOUS | Status: AC | PRN
  Administered 2023-01-05: 75 mL via INTRAVENOUS

## 2023-01-05 MED ORDER — SODIUM CHLORIDE 0.9 % IV BOLUS
250.0000 mL | Freq: Once | INTRAVENOUS | Status: AC
  Administered 2023-01-05: 250 mL via INTRAVENOUS

## 2023-01-05 MED ORDER — FUROSEMIDE 10 MG/ML IJ SOLN
80.0000 mg | Freq: Once | INTRAMUSCULAR | Status: AC
  Administered 2023-01-05: 80 mg via INTRAVENOUS
  Filled 2023-01-05: qty 8

## 2023-01-05 MED ORDER — IPRATROPIUM-ALBUTEROL 0.5-2.5 (3) MG/3ML IN SOLN
6.0000 mL | Freq: Once | RESPIRATORY_TRACT | Status: AC
  Administered 2023-01-05: 6 mL via RESPIRATORY_TRACT
  Filled 2023-01-05: qty 3

## 2023-01-05 MED ORDER — DOXYCYCLINE HYCLATE 50 MG PO CAPS: 100.0000 mg | ORAL_CAPSULE | Freq: Two times a day (BID) | ORAL | 0 refills | Status: DC

## 2023-01-05 NOTE — ED Triage Notes (Signed)
Pt to ED via [facility] of hypotension that withheld dialysis. Pt dialysis was postponed today due to a pressure of 80/40. Pressure was 101/56 for EMS. Pt states he had a additional treatment yesterday which removed 4 L due to fluid overload. Pt endorses shoulder pain and is A&O x4.
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
## 2023-01-05 NOTE — ED Notes (Signed)
Family called to come pick patient up. Awaiting for ride prior to DC

## 2023-01-05 NOTE — ED Provider Notes (Signed)
Emergency department handoff note  Care of this patient was signed out to me at the end of the previous provider shift.  All pertinent patient information was conveyed and all questions were answered.  Patient pending CT angiography of the chest and reassessment.  Patient's blood pressure improved with only 250 mL infusion.  CT does not show any evidence of pulmonary emboli however does show signs of emphysema which fit with likely diagnosis of COPD exacerbation.  Patient does not show PCR positivity for COVID/flu/RSV and therefore patient will be discharged at this time on baseline oxygen requirement of 3 L with a steroid taper and doxycycline. The patient has been reexamined and is ready to be discharged.  All diagnostic results have been reviewed and discussed with the patient/family.  Care plan has been outlined and the patient/family understands all current diagnoses, results, and treatment plans.  There are no new complaints, changes, or physical findings at this time.  All questions have been addressed and answered.  Patient was instructed to, and agrees to follow-up with their primary care physician as well as return to the emergency department if any new or worsening symptoms develop.   Naaman Plummer, MD 01/05/23 2155

## 2023-01-05 NOTE — ED Notes (Signed)
Patient transported to CT 

## 2023-01-07 ENCOUNTER — Ambulatory Visit (INDEPENDENT_AMBULATORY_CARE_PROVIDER_SITE_OTHER): Payer: Medicare Other | Admitting: Nurse Practitioner

## 2023-01-07 ENCOUNTER — Encounter: Payer: Self-pay | Admitting: Nurse Practitioner

## 2023-01-07 VITALS — BP 147/75 | HR 82 | Temp 97.8°F | Wt 235.9 lb

## 2023-01-07 DIAGNOSIS — Z09 Encounter for follow-up examination after completed treatment for conditions other than malignant neoplasm: Secondary | ICD-10-CM

## 2023-01-07 DIAGNOSIS — I152 Hypertension secondary to endocrine disorders: Secondary | ICD-10-CM

## 2023-01-07 DIAGNOSIS — E1159 Type 2 diabetes mellitus with other circulatory complications: Secondary | ICD-10-CM

## 2023-01-07 DIAGNOSIS — D631 Anemia in chronic kidney disease: Secondary | ICD-10-CM

## 2023-01-07 DIAGNOSIS — N186 End stage renal disease: Secondary | ICD-10-CM

## 2023-01-07 DIAGNOSIS — J441 Chronic obstructive pulmonary disease with (acute) exacerbation: Secondary | ICD-10-CM

## 2023-01-07 DIAGNOSIS — N185 Chronic kidney disease, stage 5: Secondary | ICD-10-CM

## 2023-01-07 DIAGNOSIS — E1122 Type 2 diabetes mellitus with diabetic chronic kidney disease: Secondary | ICD-10-CM

## 2023-01-07 DIAGNOSIS — S81802A Unspecified open wound, left lower leg, initial encounter: Secondary | ICD-10-CM

## 2023-01-07 DIAGNOSIS — Z992 Dependence on renal dialysis: Secondary | ICD-10-CM

## 2023-01-07 DIAGNOSIS — I5032 Chronic diastolic (congestive) heart failure: Secondary | ICD-10-CM | POA: Diagnosis not present

## 2023-01-07 MED ORDER — MUPIROCIN CALCIUM 2 % EX CREA: 1.0000 | TOPICAL_CREAM | Freq: Two times a day (BID) | CUTANEOUS | 0 refills | Status: DC

## 2023-01-07 MED ORDER — ALBUTEROL SULFATE HFA 108 (90 BASE) MCG/ACT IN AERS: 1.0000 | INHALATION_SPRAY | Freq: Four times a day (QID) | RESPIRATORY_TRACT | 1 refills | Status: DC | PRN

## 2023-01-07 MED ORDER — TOPIRAMATE 50 MG PO TABS: ORAL_TABLET | ORAL | 0 refills | Status: DC

## 2023-01-07 NOTE — Assessment & Plan Note (Signed)
Chronic.  Controlled.  Continue with current medication regimen.  Labs ordered today.  Return to clinic in 1 months for reevaluation.  Recommend making an appointment with HF.  Call sooner if concerns arise.   - Reminded to call for an overnight weight gain of >2 pounds or a weekly weight gain of >5 pounds - not adding salt to food and read food labels. Reviewed the importance of keeping daily sodium intake to 2000mg  daily. - Avoid Ibuprofen products.

## 2023-01-07 NOTE — Assessment & Plan Note (Signed)
Chronic. Ongoing.  Recommend being consistent with medication.  Patient endorses taking medication regularly.  He is accompanied by his sister and caregiver.  Labs ordered today.  Follow up in 1 month.  Call sooner if concerns arise.

## 2023-01-07 NOTE — Assessment & Plan Note (Signed)
Chronic.  Labs ordered at visit today.  Will make recommendations based on lab results.   
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
## 2023-01-07 NOTE — Assessment & Plan Note (Signed)
Chronic. Continue to collaborate with Nephrology.  Recommend being consistent with Dialysis.

## 2023-01-07 NOTE — Assessment & Plan Note (Signed)
Chronic.  Now on trellegy.  Referral placed for Pulmonology.  Patient on 3L O2.  Recommend he establish with a Pulmonologist who will manage his Oxygen.

## 2023-01-08 LAB — COMPREHENSIVE METABOLIC PANEL WITH GFR
ALT: 11 IU/L (ref 0–44)
AST: 11 IU/L (ref 0–40)
Albumin/Globulin Ratio: 1.7 (ref 1.2–2.2)
Albumin: 4 g/dL (ref 3.8–4.9)
Alkaline Phosphatase: 93 IU/L (ref 44–121)
BUN/Creatinine Ratio: 9 (ref 9–20)
BUN: 50 mg/dL — ABNORMAL HIGH (ref 6–24)
Bilirubin Total: 0.3 mg/dL (ref 0.0–1.2)
CO2: 22 mmol/L (ref 20–29)
Calcium: 8.6 mg/dL — ABNORMAL LOW (ref 8.7–10.2)
Chloride: 103 mmol/L (ref 96–106)
Creatinine, Ser: 5.85 mg/dL — ABNORMAL HIGH (ref 0.76–1.27)
Globulin, Total: 2.4 g/dL (ref 1.5–4.5)
Glucose: 92 mg/dL (ref 70–99)
Potassium: 5.1 mmol/L (ref 3.5–5.2)
Sodium: 142 mmol/L (ref 134–144)
Total Protein: 6.4 g/dL (ref 6.0–8.5)
eGFR: 11 mL/min/1.73 — ABNORMAL LOW

## 2023-01-08 LAB — CBC WITH DIFFERENTIAL/PLATELET
Basophils Absolute: 0.1 10*3/uL (ref 0.0–0.2)
Basos: 1 %
EOS (ABSOLUTE): 0.2 10*3/uL (ref 0.0–0.4)
Eos: 2 %
Hematocrit: 31.3 % — ABNORMAL LOW (ref 37.5–51.0)
Hemoglobin: 10.3 g/dL — ABNORMAL LOW (ref 13.0–17.7)
Immature Grans (Abs): 0 10*3/uL (ref 0.0–0.1)
Immature Granulocytes: 1 %
Lymphocytes Absolute: 1.6 10*3/uL (ref 0.7–3.1)
Lymphs: 19 %
MCH: 30 pg (ref 26.6–33.0)
MCHC: 32.9 g/dL (ref 31.5–35.7)
MCV: 91 fL (ref 79–97)
Monocytes Absolute: 0.5 10*3/uL (ref 0.1–0.9)
Monocytes: 6 %
Neutrophils Absolute: 6 10*3/uL (ref 1.4–7.0)
Neutrophils: 71 %
Platelets: 313 10*3/uL (ref 150–450)
RBC: 3.43 x10E6/uL — ABNORMAL LOW (ref 4.14–5.80)
RDW: 13 % (ref 11.6–15.4)
WBC: 8.3 10*3/uL (ref 3.4–10.8)

## 2023-01-08 LAB — HEMOGLOBIN A1C
Est. average glucose Bld gHb Est-mCnc: 134 mg/dL
Hgb A1c MFr Bld: 6.3 % — ABNORMAL HIGH (ref 4.8–5.6)

## 2023-01-08 NOTE — Progress Notes (Signed)
Please let patient know that his anemia improved from hospitalization which is good.  His kidney function did go back up.  I recommend he be consistent with going to dialysis.  A1c is well controlled at 6.3.  We do not need to add any medications at this time.  No other concerns at this time.

## 2023-01-09 ENCOUNTER — Inpatient Hospital Stay: Payer: Medicare Other | Admitting: Nurse Practitioner

## 2023-01-10 ENCOUNTER — Telehealth (INDEPENDENT_AMBULATORY_CARE_PROVIDER_SITE_OTHER): Payer: Self-pay

## 2023-01-10 NOTE — Telephone Encounter (Signed)
Spoke with the patient's sister and the patient is scheduled with Dr. Lucky Cowboy on 01/14/23 with [facility]. Pre-procedure instructions were discussed and patient's sister stated she understood.

## 2023-01-14 DIAGNOSIS — N186 End stage renal disease: Secondary | ICD-10-CM

## 2023-01-14 NOTE — Telephone Encounter (Signed)
Spoke with the patient's sister and she wanted to reschedule his permcath removal from 01/14/23 to 01/21/23 with a 1:45 [facility]. Patient has been rescheduled and patient's sister notified.

## 2023-01-18 ENCOUNTER — Ambulatory Visit: Payer: Self-pay

## 2023-01-18 NOTE — Telephone Encounter (Signed)
  Chief Complaint: BP 198/118 had not taken BP meds- took meds during call 1330 Symptoms: none Frequency: today Pertinent Negatives: Patient denies no dizziness no headache- has preexisting blurred vision reported by pt. Disposition: [] ED /[] Urgent Care (no appt availability in office) / [] Appointment(In office/virtual)/ []  [facility]/ [] Home Care/ [x] Refused Recommended Disposition /[] [place] [facility]/ []  Follow-up with PCP Additional Notes: pt refused appt stating that he has o transportation and cannot receive or make calls(except to 911) Advised for pt to recheck BP in 1 hour and to call back when friend Iona Beard is present.  Reason for Disposition  [6] Systolic BP  >= 203 OR Diastolic >= 559 AND [7] missed most recent dose of blood pressure medication  Answer Assessment - Initial Assessment Questions 1. BLOOD PRESSURE: "What is the blood pressure?" "Did you take at least two measurements 5 minutes apart?"     *No Answer* 2. ONSET: "When did you take your blood pressure?"     *No Answer* 3. HOW: "How did you take your blood pressure?" (e.g., automatic home BP monitor, visiting nurse)     *No Answer* 4. HISTORY: "Do you have a history of high blood pressure?"     *No Answer* 5. MEDICINES: "Are you taking any medicines for blood pressure?" "Have you missed any doses recently?"     *No Answer* 6. OTHER SYMPTOMS: "Do you have any symptoms?" (e.g., blurred vision, chest pain, difficulty breathing, headache, weakness)     Blurred vision 7. PREGNANCY: "Is there any chance you are pregnant?" "When was your last menstrual period?"     N/a  Protocols used: Blood Pressure - High-A-AH

## 2023-01-21 ENCOUNTER — Telehealth (INDEPENDENT_AMBULATORY_CARE_PROVIDER_SITE_OTHER): Payer: Self-pay

## 2023-01-21 ENCOUNTER — Encounter: Admission: RE | Payer: Self-pay | Source: Home / Self Care

## 2023-01-21 ENCOUNTER — Ambulatory Visit: Admission: RE | Admit: 2023-01-21 | Payer: Medicare Other | Source: Home / Self Care | Admitting: Vascular Surgery

## 2023-01-21 DIAGNOSIS — N186 End stage renal disease: Secondary | ICD-10-CM

## 2023-01-21 SURGERY — DIALYSIS/PERMA CATHETER REMOVAL
Anesthesia: LOCAL

## 2023-01-21 NOTE — Telephone Encounter (Signed)
I received a call from [place] at [facility] to cancel the permcath removal that is scheduled for the patient on 01/21/23. This procedure has been canceled.

## 2023-01-21 NOTE — Telephone Encounter (Signed)
Called patient to schedule an appointment with Santiago Glad

## 2023-01-21 NOTE — Telephone Encounter (Signed)
I recommend he make an appt.  Also recommend being consistent with taking blood pressure medications.

## 2023-01-21 NOTE — Telephone Encounter (Signed)
Please call and schedule follow up per provider.

## 2023-01-22 ENCOUNTER — Other Ambulatory Visit (INDEPENDENT_AMBULATORY_CARE_PROVIDER_SITE_OTHER): Payer: Self-pay | Admitting: Nurse Practitioner

## 2023-01-22 DIAGNOSIS — S91309S Unspecified open wound, unspecified foot, sequela: Secondary | ICD-10-CM

## 2023-01-23 ENCOUNTER — Ambulatory Visit (INDEPENDENT_AMBULATORY_CARE_PROVIDER_SITE_OTHER): Payer: Medicare Other

## 2023-01-23 ENCOUNTER — Ambulatory Visit (INDEPENDENT_AMBULATORY_CARE_PROVIDER_SITE_OTHER): Payer: Medicare Other | Admitting: Nurse Practitioner

## 2023-01-23 ENCOUNTER — Other Ambulatory Visit: Payer: Self-pay | Admitting: Nurse Practitioner

## 2023-01-23 NOTE — Telephone Encounter (Signed)
Requested Prescriptions  Pending Prescriptions Disp Refills   pantoprazole (PROTONIX) 40 MG tablet [Pharmacy Med Name: PANTOPRAZOLE 40MG TABLETS] 180 tablet 0    Sig: TAKE 1 TABLET(40 MG) BY MOUTH TWICE DAILY AS NEEDED     Gastroenterology: Proton Pump Inhibitors Passed - 01/23/2023  8:53 AM      Passed - Valid encounter within last 12 months    Recent Outpatient Visits           2 weeks ago Hospital discharge follow-up   [place], NP   6 months ago Acute on chronic diastolic CHF (congestive heart failure) [facility])   [place] Kathrine Haddock, NP   11 months ago Hypertension associated with diabetes [facility])   [place] Jon Billings, NP   1 year ago Hospital discharge follow-up   [place] Jon Billings, NP   1 year ago Hypertension associated with diabetes [facility])   [place] Jon Billings, NP       Future Appointments             In 2 weeks Jon Billings, NP [place]

## 2023-02-05 ENCOUNTER — Telehealth (INDEPENDENT_AMBULATORY_CARE_PROVIDER_SITE_OTHER): Payer: Self-pay

## 2023-02-05 NOTE — Telephone Encounter (Signed)
Spoke with the patient's sister to schedule him for a permcath removal. The patient's sister was offered 02/11/23 and declined stating that she was told by [facility] that they needed to wait awhile before taking the cath out. I explained that I received a referral on 02/04/23 to have it removed. Patient's sister requested to do another week and the patient has been scheduled for 02/18/23 with a 2:00 pm arrival time to the [facility]. Patient's sister was in agreement and understood and it will be mailed.

## 2023-02-06 ENCOUNTER — Institutional Professional Consult (permissible substitution): Payer: Medicare Other | Admitting: Student in an Organized Health Care Education/Training Program

## 2023-02-11 ENCOUNTER — Ambulatory Visit: Payer: Medicare Other | Admitting: Nurse Practitioner

## 2023-02-18 ENCOUNTER — Institutional Professional Consult (permissible substitution): Payer: Medicare Other | Admitting: Student in an Organized Health Care Education/Training Program

## 2023-02-18 ENCOUNTER — Ambulatory Visit
Admission: RE | Admit: 2023-02-18 | Discharge: 2023-02-18 | Disposition: A | Discharge status: Home / Self Care | Payer: Medicare Other | Attending: Vascular Surgery | Admitting: Vascular Surgery

## 2023-02-18 DIAGNOSIS — N186 End stage renal disease: Secondary | ICD-10-CM

## 2023-02-18 SURGERY — DIALYSIS/PERMA CATHETER REMOVAL
Anesthesia: LOCAL

## 2023-02-19 ENCOUNTER — Telehealth (INDEPENDENT_AMBULATORY_CARE_PROVIDER_SITE_OTHER): Payer: Self-pay

## 2023-02-19 NOTE — Telephone Encounter (Signed)
Received this from Eulogio Ditch NP: can you call his dialysis center and see if he's having issues with his fistula?  They went to take out his perm cath yesterday but the family was saying there were a ton of issues.  If they say he having issues, let's get him on with a fistulogram but if they don't have any issues then we can reschedule the perm cath removal.    I contacted the dialysis center per them the patient has some issues occasionally.The patient and sister was told to have his permcath removed as he does dialysis consistently usually. I gave them the information regarding the sister and patient stating he was having issues with dialysis, per them they will speak with the patient and his sister and send over another order when he is ready to have it removed.

## 2023-02-28 ENCOUNTER — Telehealth (INDEPENDENT_AMBULATORY_CARE_PROVIDER_SITE_OTHER): Payer: Self-pay

## 2023-02-28 NOTE — Telephone Encounter (Signed)
Spoke with the patient's sister and he is scheduled with Dr. Lucky Cowboy on 03/04/23 for a permcath removal at the [facility]. Arrival time is 1:15 pm and pre-procedure instructions were discussed and will be sent to Mychart as well.

## 2023-03-04 ENCOUNTER — Telehealth (INDEPENDENT_AMBULATORY_CARE_PROVIDER_SITE_OTHER): Payer: Self-pay

## 2023-03-04 ENCOUNTER — Encounter: Admission: RE | Payer: Self-pay | Source: Home / Self Care

## 2023-03-04 ENCOUNTER — Ambulatory Visit: Admission: RE | Admit: 2023-03-04 | Payer: Medicare Other | Source: Home / Self Care | Admitting: Vascular Surgery

## 2023-03-04 DIAGNOSIS — N186 End stage renal disease: Secondary | ICD-10-CM

## 2023-03-04 SURGERY — DIALYSIS/PERMA CATHETER REMOVAL
Anesthesia: LOCAL

## 2023-03-04 NOTE — Telephone Encounter (Signed)
I received a call regarding canceling the patient's permcath removal from his sister, per her the dialysis center stated to cancel. The patient has been canceled.

## 2023-03-05 ENCOUNTER — Telehealth: Payer: Self-pay

## 2023-03-05 ENCOUNTER — Telehealth: Payer: Self-pay | Admitting: Nurse Practitioner

## 2023-03-05 NOTE — Telephone Encounter (Addendum)
Called to speak with Derek Cooley but her voicemail was full.

## 2023-03-05 NOTE — Telephone Encounter (Unsigned)
Copied from [place] [id number]. Topic: General - Inquiry >> Mar 05, 2023 12:22 PM Ludger Nutting wrote: Caryl Asp from [employer] would like to speak with patients pcp about medical concerns.

## 2023-03-05 NOTE — Telephone Encounter (Signed)
[phone]- Fax   Spoke with Joy regarding patient.  Joy expressed concerns regarding patient missing appointments.  She is wondering if he had missed appointments and if he was complaint with medications.  I explained that she has missed several appointments in our office and not compliant with medications.  Joy requested a letter be written with my concerns, she plans to apply for guardianship.

## 2023-03-06 ENCOUNTER — Ambulatory Visit (INDEPENDENT_AMBULATORY_CARE_PROVIDER_SITE_OTHER): Payer: Medicare Other

## 2023-03-06 DIAGNOSIS — S91309S Unspecified open wound, unspecified foot, sequela: Secondary | ICD-10-CM

## 2023-03-06 DIAGNOSIS — N186 End stage renal disease: Secondary | ICD-10-CM

## 2023-03-06 NOTE — Telephone Encounter (Signed)
Letter written.  See Fax number below on where to send letter.

## 2023-03-06 NOTE — Telephone Encounter (Signed)
Letter printed and faxed as requested. 

## 2023-03-08 ENCOUNTER — Emergency Department: Payer: Medicare Other

## 2023-03-08 ENCOUNTER — Inpatient Hospital Stay
Admission: EM | Admit: 2023-03-08 | Discharge: 2023-03-10 | DRG: 291 | Disposition: A | Discharge status: Home / Self Care | Payer: Medicare Other | Source: Ambulatory Visit | Attending: Student in an Organized Health Care Education/Training Program | Admitting: Student in an Organized Health Care Education/Training Program

## 2023-03-08 ENCOUNTER — Other Ambulatory Visit: Payer: Self-pay

## 2023-03-08 DIAGNOSIS — F09 Unspecified mental disorder due to known physiological condition: Secondary | ICD-10-CM

## 2023-03-08 DIAGNOSIS — Z8261 Family history of arthritis: Secondary | ICD-10-CM

## 2023-03-08 DIAGNOSIS — Z818 Family history of other mental and behavioral disorders: Secondary | ICD-10-CM

## 2023-03-08 DIAGNOSIS — J439 Emphysema, unspecified: Secondary | ICD-10-CM | POA: Diagnosis present

## 2023-03-08 DIAGNOSIS — F419 Anxiety disorder, unspecified: Secondary | ICD-10-CM | POA: Diagnosis present

## 2023-03-08 DIAGNOSIS — E785 Hyperlipidemia, unspecified: Secondary | ICD-10-CM | POA: Diagnosis present

## 2023-03-08 DIAGNOSIS — Z841 Family history of disorders of kidney and ureter: Secondary | ICD-10-CM

## 2023-03-08 DIAGNOSIS — Z8673 Personal history of transient ischemic attack (TIA), and cerebral infarction without residual deficits: Secondary | ICD-10-CM

## 2023-03-08 DIAGNOSIS — E1129 Type 2 diabetes mellitus with other diabetic kidney complication: Secondary | ICD-10-CM | POA: Diagnosis present

## 2023-03-08 DIAGNOSIS — E669 Obesity, unspecified: Secondary | ICD-10-CM | POA: Diagnosis present

## 2023-03-08 DIAGNOSIS — J449 Chronic obstructive pulmonary disease, unspecified: Secondary | ICD-10-CM | POA: Diagnosis present

## 2023-03-08 DIAGNOSIS — R0602 Shortness of breath: Secondary | ICD-10-CM

## 2023-03-08 DIAGNOSIS — Z885 Allergy status to narcotic agent status: Secondary | ICD-10-CM

## 2023-03-08 DIAGNOSIS — F17210 Nicotine dependence, cigarettes, uncomplicated: Secondary | ICD-10-CM | POA: Diagnosis present

## 2023-03-08 DIAGNOSIS — N2581 Secondary hyperparathyroidism of renal origin: Secondary | ICD-10-CM | POA: Diagnosis present

## 2023-03-08 DIAGNOSIS — Z825 Family history of asthma and other chronic lower respiratory diseases: Secondary | ICD-10-CM

## 2023-03-08 DIAGNOSIS — Z8614 Personal history of Methicillin resistant Staphylococcus aureus infection: Secondary | ICD-10-CM

## 2023-03-08 DIAGNOSIS — I132 Hypertensive heart and chronic kidney disease with heart failure and with stage 5 chronic kidney disease, or end stage renal disease: Principal | ICD-10-CM | POA: Diagnosis present

## 2023-03-08 DIAGNOSIS — F418 Other specified anxiety disorders: Secondary | ICD-10-CM | POA: Diagnosis present

## 2023-03-08 DIAGNOSIS — J9620 Acute and chronic respiratory failure, unspecified whether with hypoxia or hypercapnia: Secondary | ICD-10-CM | POA: Diagnosis present

## 2023-03-08 DIAGNOSIS — K219 Gastro-esophageal reflux disease without esophagitis: Secondary | ICD-10-CM | POA: Diagnosis present

## 2023-03-08 DIAGNOSIS — Z83438 Family history of other disorder of lipoprotein metabolism and other lipidemia: Secondary | ICD-10-CM

## 2023-03-08 DIAGNOSIS — Z888 Allergy status to other drugs, medicaments and biological substances status: Secondary | ICD-10-CM

## 2023-03-08 DIAGNOSIS — Z881 Allergy status to other antibiotic agents status: Secondary | ICD-10-CM

## 2023-03-08 DIAGNOSIS — Z992 Dependence on renal dialysis: Secondary | ICD-10-CM

## 2023-03-08 DIAGNOSIS — N186 End stage renal disease: Secondary | ICD-10-CM

## 2023-03-08 DIAGNOSIS — G3184 Mild cognitive impairment, so stated: Secondary | ICD-10-CM | POA: Diagnosis present

## 2023-03-08 DIAGNOSIS — Z79899 Other long term (current) drug therapy: Secondary | ICD-10-CM

## 2023-03-08 DIAGNOSIS — F32A Depression, unspecified: Secondary | ICD-10-CM | POA: Diagnosis present

## 2023-03-08 DIAGNOSIS — G4733 Obstructive sleep apnea (adult) (pediatric): Secondary | ICD-10-CM | POA: Diagnosis present

## 2023-03-08 DIAGNOSIS — D631 Anemia in chronic kidney disease: Secondary | ICD-10-CM | POA: Diagnosis present

## 2023-03-08 DIAGNOSIS — Z833 Family history of diabetes mellitus: Secondary | ICD-10-CM

## 2023-03-08 DIAGNOSIS — I5033 Acute on chronic diastolic (congestive) heart failure: Secondary | ICD-10-CM | POA: Diagnosis present

## 2023-03-08 DIAGNOSIS — Z8249 Family history of ischemic heart disease and other diseases of the circulatory system: Secondary | ICD-10-CM

## 2023-03-08 DIAGNOSIS — E1122 Type 2 diabetes mellitus with diabetic chronic kidney disease: Secondary | ICD-10-CM | POA: Diagnosis present

## 2023-03-08 DIAGNOSIS — E8770 Fluid overload, unspecified: Secondary | ICD-10-CM | POA: Diagnosis present

## 2023-03-08 DIAGNOSIS — G8929 Other chronic pain: Secondary | ICD-10-CM | POA: Diagnosis present

## 2023-03-08 DIAGNOSIS — Z91158 Patient's noncompliance with renal dialysis for other reason: Secondary | ICD-10-CM

## 2023-03-08 DIAGNOSIS — F1910 Other psychoactive substance abuse, uncomplicated: Secondary | ICD-10-CM | POA: Diagnosis present

## 2023-03-08 DIAGNOSIS — J9621 Acute and chronic respiratory failure with hypoxia: Secondary | ICD-10-CM | POA: Diagnosis present

## 2023-03-08 DIAGNOSIS — Z8349 Family history of other endocrine, nutritional and metabolic diseases: Secondary | ICD-10-CM

## 2023-03-08 DIAGNOSIS — K529 Noninfective gastroenteritis and colitis, unspecified: Secondary | ICD-10-CM

## 2023-03-08 DIAGNOSIS — Z1152 Encounter for screening for COVID-19: Secondary | ICD-10-CM

## 2023-03-08 DIAGNOSIS — Z7982 Long term (current) use of aspirin: Secondary | ICD-10-CM

## 2023-03-08 DIAGNOSIS — R0902 Hypoxemia: Principal | ICD-10-CM

## 2023-03-08 DIAGNOSIS — Z6834 Body mass index (BMI) 34.0-34.9, adult: Secondary | ICD-10-CM

## 2023-03-08 LAB — BLOOD GAS, VENOUS
Acid-Base Excess: 9.4 mmol/L — ABNORMAL HIGH (ref 0.0–2.0)
Bicarbonate: 36.1 mmol/L — ABNORMAL HIGH (ref 20.0–28.0)
O2 Saturation: 65.7 %
Patient temperature: 37
pCO2, Ven: 57 mmHg (ref 44–60)
pH, Ven: 7.41 (ref 7.25–7.43)
pO2, Ven: 37 mmHg (ref 32–45)

## 2023-03-08 LAB — BASIC METABOLIC PANEL
Anion gap: 15 (ref 5–15)
BUN: 20 mg/dL (ref 6–20)
CO2: 27 mmol/L (ref 22–32)
Calcium: 8.5 mg/dL — ABNORMAL LOW (ref 8.9–10.3)
Chloride: 96 mmol/L — ABNORMAL LOW (ref 98–111)
Creatinine, Ser: 3.03 mg/dL — ABNORMAL HIGH (ref 0.61–1.24)
GFR, Estimated: 23 mL/min — ABNORMAL LOW (ref >60)
Glucose, Bld: 112 mg/dL — ABNORMAL HIGH (ref 70–99)
Potassium: 3.5 mmol/L (ref 3.5–5.1)
Sodium: 138 mmol/L (ref 135–145)

## 2023-03-08 LAB — CBC
HCT: 40.2 % (ref 39.0–52.0)
HCT: 38.7 % — ABNORMAL LOW (ref 39.0–52.0)
Hemoglobin: 13.5 g/dL (ref 13.0–17.0)
Hemoglobin: 12.8 g/dL — ABNORMAL LOW (ref 13.0–17.0)
MCH: 29.3 pg (ref 26.0–34.0)
MCH: 29.8 pg (ref 26.0–34.0)
MCHC: 33.6 g/dL (ref 30.0–36.0)
MCHC: 33.1 g/dL (ref 30.0–36.0)
MCV: 87.4 fL (ref 80.0–100.0)
MCV: 90.2 fL (ref 80.0–100.0)
Platelets: 315 10*3/uL (ref 150–400)
Platelets: 297 10*3/uL (ref 150–400)
RBC: 4.6 MIL/uL (ref 4.22–5.81)
RBC: 4.29 MIL/uL (ref 4.22–5.81)
RDW: 12.7 % (ref 11.5–15.5)
RDW: 12.8 % (ref 11.5–15.5)
WBC: 7.7 10*3/uL (ref 4.0–10.5)
WBC: 9 10*3/uL (ref 4.0–10.5)
nRBC: 0 % (ref 0.0–0.2)
nRBC: 0 % (ref 0.0–0.2)

## 2023-03-08 LAB — BRAIN NATRIURETIC PEPTIDE: B Natriuretic Peptide: 195 pg/mL — ABNORMAL HIGH (ref 0.0–100.0)

## 2023-03-08 LAB — LACTIC ACID, PLASMA: Lactic Acid, Venous: 1 mmol/L (ref 0.5–1.9)

## 2023-03-08 LAB — TROPONIN I (HIGH SENSITIVITY): Troponin I (High Sensitivity): 50 ng/L — ABNORMAL HIGH (ref <18)

## 2023-03-08 MED ORDER — ONDANSETRON HCL 4 MG/2ML IJ SOLN: 4.0000 mg | Freq: Four times a day (QID) | INTRAMUSCULAR | Status: DC | PRN

## 2023-03-08 MED ORDER — HEPARIN SODIUM (PORCINE) 5000 UNIT/ML IJ SOLN
5000.0000 [IU] | Freq: Three times a day (TID) | INTRAMUSCULAR | Status: DC
  Administered 2023-03-09 – 2023-03-10 (×5): 5000 [IU] via SUBCUTANEOUS
  Filled 2023-03-08 (×5): qty 1

## 2023-03-08 MED ORDER — INSULIN ASPART 100 UNIT/ML IJ SOLN: 0.0000 [IU] | Freq: Every day | INTRAMUSCULAR | Status: DC

## 2023-03-08 MED ORDER — FUROSEMIDE 10 MG/ML IJ SOLN
60.0000 mg | Freq: Once | INTRAMUSCULAR | Status: AC
  Administered 2023-03-09: 60 mg via INTRAVENOUS
  Filled 2023-03-08: qty 8

## 2023-03-08 MED ORDER — INSULIN ASPART 100 UNIT/ML IJ SOLN: 0.0000 [IU] | Freq: Three times a day (TID) | INTRAMUSCULAR | Status: DC

## 2023-03-08 MED ORDER — ACETAMINOPHEN 650 MG RE SUPP: 650.0000 mg | Freq: Four times a day (QID) | RECTAL | Status: DC | PRN

## 2023-03-08 MED ORDER — ONDANSETRON HCL 4 MG PO TABS: 4.0000 mg | ORAL_TABLET | Freq: Four times a day (QID) | ORAL | Status: DC | PRN

## 2023-03-08 MED ORDER — IOHEXOL 300 MG/ML  SOLN
100.0000 mL | Freq: Once | INTRAMUSCULAR | Status: AC | PRN
  Administered 2023-03-08: 1 mL via INTRAVENOUS

## 2023-03-08 MED ORDER — ACETAMINOPHEN 325 MG PO TABS
650.0000 mg | ORAL_TABLET | Freq: Four times a day (QID) | ORAL | Status: DC | PRN
  Administered 2023-03-09 – 2023-03-10 (×5): 650 mg via ORAL
  Filled 2023-03-08 (×5): qty 2

## 2023-03-08 NOTE — Assessment & Plan Note (Signed)
UDS

## 2023-03-08 NOTE — Assessment & Plan Note (Signed)
CPAP nightly

## 2023-03-08 NOTE — Assessment & Plan Note (Signed)
Sliding scale insulin coverage 

## 2023-03-08 NOTE — Assessment & Plan Note (Signed)
Delirium precautions 

## 2023-03-08 NOTE — Assessment & Plan Note (Addendum)
Acute on chronic respiratory failure Secondary to missed dialysis sessions IV Lasix tonight, BiPAP if needed Plan for early dialysis session in the a.m. per discussion between ED provider and Dr. Juleen China Expecting improvement with dialysis Continue losartan and carvedilol

## 2023-03-08 NOTE — Assessment & Plan Note (Signed)
Continue duloxetine
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
## 2023-03-08 NOTE — ED Provider Notes (Incomplete)
[facility] Provider Note    Event Date/Time   First MD Initiated Contact with Patient 03/08/23 1659     (approximate)   History   Shortness of Breath   HPI  Derek Cooley. is a 57 y.o. male here with shortness of breath.  The patient is an end-stage renal patient.  He states that he has had nausea, vomiting, and diarrhea for the last week.  He missed 2 sessions of dialysis.  He went to dialysis today and was notably short of breath despite receiving full treatment.  He is on 4 to 5 L at baseline and was up to 8 L at 1 point.  Subsequent presents for evaluation.  States he feels generally weak and short of breath.  Denies any ongoing chest pain.  Denies any other complaints.  Denies any known fevers or chills.     Physical Exam   Triage Vital Signs: ED Triage Vitals  Enc Vitals Group     BP 03/08/23 1657 (!) 166/105     Pulse Rate 03/08/23 1657 78     Resp 03/08/23 1657 18     Temp 03/08/23 1709 98 F (36.7 C)     Temp Source 03/08/23 1657 Oral     SpO2 03/08/23 1654 100 %     Weight 03/08/23 1655 200 lb (90.7 kg)     Height 03/08/23 1655 5' 7.5" (1.715 m)     Head Circumference --      Peak Flow --      Pain Score 03/08/23 1655 0     Pain Loc --      Pain Edu? --      Excl. in [place]? --     Most recent vital signs: Vitals:   03/08/23 2355 03/09/23 0059  BP: (!) 185/94 (!) 222/126  Pulse: 80 87  Resp: 20 20  Temp: 97.6 F (36.4 C) 97.8 F (36.6 C)  SpO2: 100% 97%     General: Awake, no distress.  CV:  Good peripheral perfusion.  Regular rate and rhythm. Resp:  Increased work of breathing with diffuse wheezes.  Bibasilar rales. Abd:  No distention.  Other:  1+ edema bilateral lower extremities.   ED Results / Procedures / Treatments   Labs (all labs ordered are listed, but only abnormal results are displayed) Labs Reviewed  BASIC METABOLIC PANEL - Abnormal; Notable for the following components:      Result Value   Chloride  96 (*)    Glucose, Bld 112 (*)    Creatinine, Ser 3.03 (*)    Calcium 8.5 (*)    GFR, Estimated 23 (*)    All other components within normal limits  BLOOD GAS, VENOUS - Abnormal; Notable for the following components:   Bicarbonate 36.1 (*)    Acid-Base Excess 9.4 (*)    All other components within normal limits  BRAIN NATRIURETIC PEPTIDE - Abnormal; Notable for the following components:   B Natriuretic Peptide 195.0 (*)    All other components within normal limits  URINE DRUG SCREEN, QUALITATIVE ([facility] ONLY) - Abnormal; Notable for the following components:   Amphetamines, Ur Screen POSITIVE (*)    Cannabinoid 50 Ng, Ur [place] POSITIVE (*)    All other components within normal limits  CBC - Abnormal; Notable for the following components:   Hemoglobin 12.8 (*)    HCT 38.7 (*)    All other components within normal limits  CREATININE, SERUM - Abnormal; Notable for  the following components:   Creatinine, Ser 3.69 (*)    GFR, Estimated 18 (*)    All other components within normal limits  CBG MONITORING, ED - Abnormal; Notable for the following components:   Glucose-Capillary 154 (*)    All other components within normal limits  TROPONIN I (HIGH SENSITIVITY) - Abnormal; Notable for the following components:   Troponin I (High Sensitivity) 50 (*)    All other components within normal limits  CBC  LACTIC ACID, PLASMA  HEPATITIS B SURFACE ANTIGEN  HEPATITIS B SURFACE ANTIBODY, QUANTITATIVE     EKG Normal sinus rhythm, trickle rate 75.  PR 141, QRS 1 2, QTc 465.  No acute ST elevations or depression.  Acute evidence of acute ischemia report.  Borderline T wave abnormalities.   RADIOLOGY Chest x-ray: Bilateral pulmonary edema   I also independently reviewed and agree with radiologist interpretations.   PROCEDURES:  Critical Care performed: No  .1-3 Lead EKG Interpretation  Performed by: Duffy Bruce, MD Authorized by: Duffy Bruce, MD     Interpretation: normal     ECG  rate:  70-80   ECG rate assessment: normal     Rhythm: sinus rhythm     Ectopy: none     Conduction: normal   Comments:     Indication: SOB      MEDICATIONS ORDERED IN ED: Medications  aspirin EC tablet 81 mg (has no administration in time range)  carvedilol (COREG) tablet 25 mg (has no administration in time range)  hydrALAZINE (APRESOLINE) tablet 100 mg (100 mg Oral Given 03/09/23 0136)  losartan (COZAAR) tablet 100 mg (has no administration in time range)  DULoxetine (CYMBALTA) DR capsule 60 mg (has no administration in time range)  pantoprazole (PROTONIX) EC tablet 40 mg (has no administration in time range)  albuterol (PROVENTIL) (2.5 MG/3ML) 0.083% nebulizer solution 2.5 mg (has no administration in time range)  acetaminophen (TYLENOL) tablet 650 mg (650 mg Oral Given 03/09/23 0136)    Or  acetaminophen (TYLENOL) suppository 650 mg ( Rectal See Alternative 03/09/23 0136)  ondansetron (ZOFRAN) tablet 4 mg (has no administration in time range)    Or  ondansetron (ZOFRAN) injection 4 mg (has no administration in time range)  insulin aspart (novoLOG) injection 0-6 Units (has no administration in time range)  insulin aspart (novoLOG) injection 0-5 Units ( Subcutaneous Not Given 03/08/23 2357)  heparin injection 5,000 Units (5,000 Units Subcutaneous Given 03/09/23 0015)  furosemide (LASIX) tablet 40 mg (has no administration in time range)  iohexol (OMNIPAQUE) 300 MG/ML solution 100 mL (1 mL Intravenous Contrast Given 03/08/23 2122)  furosemide (LASIX) injection 60 mg (60 mg Intravenous Given 03/09/23 0015)     IMPRESSION / MDM / ASSESSMENT AND PLAN / ED COURSE  I reviewed the triage vital signs and the nursing notes.                              Differential diagnosis includes, but is not limited to, CHF, HTN emergency, pulmonary edema, CAP  Patient's presentation is most consistent with acute presentation with potential threat to life or bodily function.   The patient is on  the cardiac monitor to evaluate for evidence of arrhythmia and/or significant heart rate changes  57 yo M here with SOB. Pt has h/o ESRD, CHF, HTN, HLD, DM. Suspect mild persistent fluid overload in setting of missing HD for a week. He has increased WOB and tachypnea with significant  HTN. Labs are o/w at baseline.CT A/P is negative. CXR shows edema. Will admit for dialysis, BP control Dr. Abigail Butts of nephrology aware, admit to hospitalist.   FINAL CLINICAL IMPRESSION(S) / ED DIAGNOSES   Final diagnoses:  Hypoxia  SOB (shortness of breath)     Rx / DC Orders   ED Discharge Orders     None        Note:  This document was prepared using Dragon voice recognition software and may include unintentional dictation errors.   Duffy Bruce, MD 03/09/23 Rae Lips    Duffy Bruce, MD 03/09/23 [phone]

## 2023-03-08 NOTE — Assessment & Plan Note (Signed)
Nephrology consult for continuation of dialysis 

## 2023-03-08 NOTE — H&P (Signed)
History and Physical    Patient: Derek Cooley. [id number] DOB: 18-Dec-1965 DOA: 03/08/2023 DOS: the patient was seen and examined on 03/08/2023 PCP: Jon Billings, NP  Patient coming from: Home  Chief Complaint:  Chief Complaint  Patient presents with   Shortness of Breath    HPI: Derek Cooley. is a 57 y.o. male with medical history significant for COPD, end-stage renal disease on hemodialysis Tuesday, Thursday Saturday, chronic respiratory failure on 3 L of oxygen by nasal cannula, hypertension, OSA on CPAP,  mild cognitive impairment, prior stroke, anxiety and depression, substance abuse, presents to the ED from dialysis with shortness of breath that developed towards the end of his dialysis session.  Patient admits to missing a couple dialysis sessions the week prior due to being ill with a stomach virus.  Specifically, he has been having vomiting and diarrhea for the past week associated with generalized malaise and shortness of breath.  He denied cough, fever or chills.  Denies abdominal pain.  EMS reports an O2 sat of 86 to 88% on usual oxygen requiring 6 L for transport.  ED course and data review: BP 166/105, O2 sat at 100% on O2 at 6 L.  VBG unremarkable.  Troponin 50 and BNP 195.  CBC and BMP and lactic acid unremarkable.EKG personally viewed and interpreted showing sinus rhythm at 75 with no acute ST-T wave changes.  Chest x-ray shows diffusely increased interstitial and hazy pulmonary opacities suspect for edema and a tiny right pleural effusion. In the ED provider spoke with nephrologist, Dr. Juleen China who advised that patient will be dialyzed first thing in the a.m.  Hospitalist consulted for admission.   Review of Systems: As mentioned in the history of present illness. All other systems reviewed and are negative.  Past Medical History:  Diagnosis Date   Abdominal abscess    a.) chronic MRSA infection   Acute pancreatitis    Anxiety    Asthma    Brain aneurysm     a.) 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 b.) RIGHT crainotomy for clip ligation 05/14/2019.   CHF (congestive heart failure) (HCC)    Chronic back pain    Chronic heart failure with preserved ejection fraction (HFpEF) ([place])    a. 11/2021 Echo: EF 55-60%, no rwma, mod LVH, GrI DD. Nl RV size/fxn. Mildly dil LA.   CKD (chronic kidney disease), stage V (HCC)    Depression    Emphysema of lung (HCC)    Erectile dysfunction    Followed by palliative care service    GERD (gastroesophageal reflux disease)    History of methicillin resistant staphylococcus aureus (MRSA)    HLD (hyperlipidemia)    Hypertension    Mild cognitive impairment    MRSA (methicillin resistant Staphylococcus aureus)    Obesity    OSA (obstructive sleep apnea) 2013   a.) not compliant with nocturnal PAP therapy; CPAP machine "lost or stolen".   Panic disorder    Perforated bowel ([place]) 12/10/2005   Tempoary Colostomy Bag, Skin Graft for Abd wound   Polysubstance abuse ([place])    a.) ETOH, tobacco, marijuana, methamphetamines, cocaine, BZO, opioids.   Subarachnoid hemorrhage ([place]) 01/26/2010   a.) 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   T2DM (type 2 diabetes mellitus) ([place])    Tobacco abuse    Type 2 diabetes mellitus without complication, without long-term current  use of insulin ([place]) 04/17/2016   Vitreous hemorrhage () 03/27/2011   Overview:  Bilateral; 01/2010, from [facility]    Past Surgical History:  Procedure Laterality Date   A/V FISTULAGRAM Left 07/16/2022   Procedure: A/V Fistulagram;  Surgeon: Algernon Huxley, MD;  Location: [facility];  Service: Cardiovascular;  Laterality: Left;   AV FISTULA PLACEMENT Left 05/24/2022   Procedure: ARTERIOVENOUS (AV) FISTULA CREATION ( RADIAL CEPHALIC);  Surgeon: Algernon Huxley, MD;  Location: [facility];  Service: Vascular;   Laterality: Left;   CEREBRAL ANEURYSM REPAIR Right 01/26/2010   Procedure: CEREBRAL ANEURYSM REPAIR (COIL EMBOLIZATION)   CEREBRAL ANEURYSM REPAIR Right 05/14/2019   Procedure: CRAINOTOMY FOR CEREBRAL ANEURYSM REPAIR (CLIP LIGATION)   COLON SURGERY  12/10/2005   colostomy bag placed s/p perforated bowel   COLONOSCOPY WITH PROPOFOL N/A 05/18/2020   Procedure: COLONOSCOPY WITH PROPOFOL;  Surgeon: Lin Landsman, MD;  Location: [place];  Service: Gastroenterology;  Laterality: N/A;   DIALYSIS/PERMA CATHETER INSERTION N/A 06/27/2022   Procedure: DIALYSIS/PERMA CATHETER INSERTION;  Surgeon: Katha Cabal, MD;  Location: [place] [facility];  Service: Cardiovascular;  Laterality: N/A;   KNEE SURGERY Right    Perforated bowel     Social History:  reports that he has been smoking cigarettes. He started smoking about 38 years ago. He has been smoking an average of 1 pack per day. He has never used smokeless tobacco. He reports current alcohol use. He reports that he does not currently use drugs after having used the following drugs: "Crack" cocaine, Amphetamines, Methamphetamines, Benzodiazepines, Cocaine, Marijuana, and Other-see comments.  Allergies  Allergen Reactions   Atorvastatin     Joint Aches - Severe Joint Aches - Severe Joint Aches - Severe   Avelox [Moxifloxacin Hcl In [place]]     Muscle pain   Buprenorphine     Mouth sores, confusion, shaking   Dilaudid [Hydromorphone Hcl] Hives   Fluoxetine Itching   Levofloxacin Other (See Comments)    Joint Pain   Morphine And Related    Other     Muscle pain   Suboxone [Buprenorphine Hcl-Naloxone Hcl] Other (See Comments)    Rash and confused   Vancomycin     Renal insufficiency    Family History  Problem Relation Age of Onset   Arthritis Mother    Asthma Mother    Diabetes Mother    Heart disease Mother    Hyperlipidemia Mother    Hypertension Mother    Kidney disease Mother    Thyroid disease Mother    Lung  disease Mother    Anxiety disorder Mother    Depression Mother    Diabetes Father    Heart disease Father    Depression Father    Anxiety disorder Father    Arthritis Sister    Asthma Sister    Hyperlipidemia Sister    Hypertension Sister    Lung disease Sister    Anxiety disorder Sister    Depression Sister    Hyperlipidemia Brother    Hypertension Brother    Diabetes Sister    Heart disease Sister    Depression Sister    Anxiety disorder Sister    Anxiety disorder Brother    Depression Brother    Heart disease Brother     Prior to Admission medications   Medication Sig Start Date End Date Taking? Authorizing Provider  albuterol (VENTOLIN HFA) 108 (90 Base) MCG/ACT inhaler Inhale 1-2 puffs into the lungs every 6 (six)  hours as needed for wheezing or shortness of breath. 01/07/23   Jon Billings, NP  amLODipine (NORVASC) 10 MG tablet TAKE 1 TABLET(10 MG) BY MOUTH DAILY 08/03/22   Jon Billings, NP  aspirin EC 81 MG tablet Take 1 tablet (81 mg total) by mouth daily. Swallow whole. 06/26/22   Sharen Hones, MD  carvedilol (COREG) 25 MG tablet TAKE 1 TABLET(25 MG) BY MOUTH TWICE DAILY WITH A MEAL 08/03/22   Jon Billings, NP  DULoxetine (CYMBALTA) 60 MG capsule Take 1 capsule (60 mg total) by mouth daily. 11/24/22 02/22/23  Pokhrel, Corrie Mckusick, MD  glucose blood (ONETOUCH ULTRA) test strip USE TO TEST BLOOD SUGAR DAILY 08/23/20   Noemi Chapel A, NP  hydrALAZINE (APRESOLINE) 100 MG tablet Take 100 mg by mouth 2 (two) times daily. 09/01/22   [provider]  lidocaine-prilocaine (EMLA) cream Apply 1 Application topically daily. 08/23/22   [provider]  losartan (COZAAR) 100 MG tablet Take 100 mg by mouth daily. 10/25/22   [provider]  multivitamin (RENA-VIT) TABS tablet Take 1 tablet by mouth daily. 07/20/22   [provider]  mupirocin cream (BACTROBAN) 2 % Apply 1 Application topically 2 (two) times daily. 01/07/23   Jon Billings, NP   nicotine (NICODERM CQ - DOSED IN MG/24 HOURS) 21 mg/24hr patch Place 1 patch (21 mg total) onto the skin daily as needed (nicotine craving). 11/24/22   Pokhrel, Corrie Mckusick, MD  OXYGEN Inhale 3 L into the lungs daily.    [provider]  pantoprazole (PROTONIX) 40 MG tablet TAKE 1 TABLET(40 MG) BY MOUTH TWICE DAILY AS NEEDED 01/23/23   Jon Billings, NP  topiramate (TOPAMAX) 50 MG tablet TAKE 1 TABLET(50 MG) BY MOUTH TWICE DAILY 01/07/23   Jon Billings, NP  torsemide (DEMADEX) 20 MG tablet TAKE 2 TABLETS(40 MG) BY MOUTH DAILY 08/03/22   Jon Billings, NP    Physical Exam: Vitals:   03/08/23 1654 03/08/23 1655 03/08/23 1657 03/08/23 1709  BP:   (!) 166/105   Pulse:   78   Resp:   18   Temp:    98 F (36.7 C)  TempSrc:   Oral Oral  SpO2: 100%     Weight:  90.7 kg    Height:  5' 7.5" (1.715 m)     Physical Exam Vitals and nursing note reviewed.  Constitutional:      General: He is not in acute distress.    Interventions: Nasal cannula in place.     Comments: Tachypneic with conversational dyspnea  HENT:     Head: Normocephalic and atraumatic.  Cardiovascular:     Rate and Rhythm: Normal rate and regular rhythm.     Heart sounds: Normal heart sounds.  Pulmonary:     Effort: Pulmonary effort is normal.     Breath sounds: Examination of the right-lower field reveals rales. Examination of the left-lower field reveals rales. Rales present.  Abdominal:     Palpations: Abdomen is soft.     Tenderness: There is no abdominal tenderness.  Neurological:     Mental Status: Mental status is at baseline.     Labs on Admission: I have personally reviewed following labs and imaging studies  CBC: Recent Labs  Lab 03/08/23 1708  WBC 7.7  HGB 13.5  HCT 40.2  MCV 87.4  PLT 123456   Basic Metabolic Panel: Recent Labs  Lab 03/08/23 1708  NA 138  K 3.5  CL 96*  CO2 27  GLUCOSE 112*  BUN 20  CREATININE 3.03*  CALCIUM 8.5*   GFR: Estimated Creatinine Clearance:  29.2 mL/min (A) (by C-G formula based on SCr of 3.03 mg/dL (H)). Liver Function Tests: No results for input(s): "AST", "ALT", "ALKPHOS", "BILITOT", "PROT", "ALBUMIN" in the last 168 hours. No results for input(s): "LIPASE", "AMYLASE" in the last 168 hours. No results for input(s): "AMMONIA" in the last 168 hours. Coagulation Profile: No results for input(s): "INR", "PROTIME" in the last 168 hours. Cardiac Enzymes: No results for input(s): "CKTOTAL", "CKMB", "CKMBINDEX", "TROPONINI" in the last 168 hours. BNP (last 3 results) No results for input(s): "PROBNP" in the last 8760 hours. HbA1C: No results for input(s): "HGBA1C" in the last 72 hours. CBG: No results for input(s): "GLUCAP" in the last 168 hours. Lipid Profile: No results for input(s): "CHOL", "HDL", "LDLCALC", "TRIG", "CHOLHDL", "LDLDIRECT" in the last 72 hours. Thyroid Function Tests: No results for input(s): "TSH", "T4TOTAL", "FREET4", "T3FREE", "THYROIDAB" in the last 72 hours. Anemia Panel: No results for input(s): "VITAMINB12", "FOLATE", "FERRITIN", "TIBC", "IRON", "RETICCTPCT" in the last 72 hours. Urine analysis:    Component Value Date/Time   COLORURINE YELLOW (A) 09/06/2022 2342   APPEARANCEUR HAZY (A) 09/06/2022 2342   APPEARANCEUR Clear 04/17/2021 1544   LABSPEC 1.010 09/06/2022 2342   LABSPEC 1.029 06/26/2012 1231   PHURINE 5.0 09/06/2022 2342   GLUCOSEU NEGATIVE 09/06/2022 2342   GLUCOSEU Negative 06/26/2012 1231   HGBUR NEGATIVE 09/06/2022 2342   BILIRUBINUR NEGATIVE 09/06/2022 2342   BILIRUBINUR Negative 04/17/2021 1544   BILIRUBINUR Negative 06/26/2012 [place] 09/06/2022 2342   PROTEINUR 100 (A) 09/06/2022 2342   NITRITE NEGATIVE 09/06/2022 2342   LEUKOCYTESUR NEGATIVE 09/06/2022 2342   LEUKOCYTESUR Negative 06/26/2012 1231    Radiological Exams on Admission: DG Chest Portable 1 View  Result Date: 03/08/2023 CLINICAL DATA:  Shortness of breath EXAM: PORTABLE CHEST 1 VIEW  COMPARISON:  01/05/2023 FINDINGS: Right-sided central venous catheter tips at the SVC and cavoatrial region. Diffusely increased interstitial and hazy pulmonary opacity. Possible tiny right effusion. Stable cardiomediastinal silhouette. No pneumothorax IMPRESSION: Diffusely increased interstitial and hazy pulmonary opacity, suspect for edema. Possible tiny right effusion. Electronically Signed   By: Donavan Foil M.D.   On: 03/08/2023 17:57     Data Reviewed: Relevant notes from primary care and specialist visits, past discharge summaries as available in EHR, including Care Everywhere. Prior diagnostic testing as pertinent to current admission diagnoses Updated medications and problem lists for reconciliation ED course, including vitals, labs, imaging, treatment and response to treatment Triage notes, nursing and pharmacy notes and ED provider's notes Notable results as noted in HPI   Assessment and Plan: * Acute on chronic diastolic (congestive) heart failure (HCC) Acute on chronic respiratory failure Secondary to missed dialysis sessions IV Lasix tonight, BiPAP if needed Plan for early dialysis session in the a.m. per discussion between ED provider and Dr. Juleen China Expecting improvement with dialysis Continue losartan and carvedilol  Acute gastroenteritis Patient had 1 week of vomiting and diarrhea  Supportive care GI panel and stool for C. difficile if diarrhea recurring  Chronic obstructive pulmonary disease (COPD) ([place]) DuoNebs as needed  Type II diabetes mellitus with renal manifestations (HCC) Sliding scale insulin coverage  Depression with anxiety Continue duloxetine  Polysubstance abuse ([place]) UDS  ESRD on hemodialysis [facility]) Nephrology consult for continuation of dialysis  OSA (obstructive sleep apnea) CPAP nightly  Mild cognitive disorder Delirium precautions     DVT prophylaxis: Heparin  Consults: Nephrology, Dr. Juleen China  Advance Care Planning:  Code  Status: Prior   Family Communication: none  Disposition Plan: Back to previous home environment  Severity of Illness: The appropriate patient status for this patient is OBSERVATION. Observation status is judged to be reasonable and necessary in order to provide the required intensity of service to ensure the patient's safety. The patient's presenting symptoms, physical exam findings, and initial radiographic and laboratory data in the context of their medical condition is felt to place them at decreased risk for further clinical deterioration. Furthermore, it is anticipated that the patient will be medically stable for discharge from the hospital within 2 midnights of admission.   Author: Athena Masse, MD 03/08/2023 9:52 PM  For on call review [email or web address].

## 2023-03-08 NOTE — Assessment & Plan Note (Signed)
DuoNebs as needed 

## 2023-03-08 NOTE — Assessment & Plan Note (Signed)
Patient had 1 week of vomiting and diarrhea  Supportive care GI panel and stool for C. difficile if diarrhea recurring

## 2023-03-09 DIAGNOSIS — R0902 Hypoxemia: Principal | ICD-10-CM

## 2023-03-09 DIAGNOSIS — E1122 Type 2 diabetes mellitus with diabetic chronic kidney disease: Secondary | ICD-10-CM | POA: Diagnosis present

## 2023-03-09 DIAGNOSIS — Z91158 Patient's noncompliance with renal dialysis for other reason: Secondary | ICD-10-CM | POA: Diagnosis not present

## 2023-03-09 DIAGNOSIS — Z1152 Encounter for screening for COVID-19: Secondary | ICD-10-CM | POA: Diagnosis not present

## 2023-03-09 DIAGNOSIS — I132 Hypertensive heart and chronic kidney disease with heart failure and with stage 5 chronic kidney disease, or end stage renal disease: Secondary | ICD-10-CM | POA: Diagnosis present

## 2023-03-09 DIAGNOSIS — Z992 Dependence on renal dialysis: Secondary | ICD-10-CM | POA: Diagnosis not present

## 2023-03-09 DIAGNOSIS — E785 Hyperlipidemia, unspecified: Secondary | ICD-10-CM | POA: Diagnosis present

## 2023-03-09 DIAGNOSIS — I5033 Acute on chronic diastolic (congestive) heart failure: Secondary | ICD-10-CM | POA: Diagnosis present

## 2023-03-09 DIAGNOSIS — Z6834 Body mass index (BMI) 34.0-34.9, adult: Secondary | ICD-10-CM | POA: Diagnosis not present

## 2023-03-09 DIAGNOSIS — N186 End stage renal disease: Secondary | ICD-10-CM | POA: Diagnosis present

## 2023-03-09 DIAGNOSIS — K529 Noninfective gastroenteritis and colitis, unspecified: Secondary | ICD-10-CM | POA: Diagnosis present

## 2023-03-09 DIAGNOSIS — R0602 Shortness of breath: Secondary | ICD-10-CM

## 2023-03-09 DIAGNOSIS — G3184 Mild cognitive impairment, so stated: Secondary | ICD-10-CM | POA: Diagnosis present

## 2023-03-09 DIAGNOSIS — F32A Depression, unspecified: Secondary | ICD-10-CM | POA: Diagnosis present

## 2023-03-09 DIAGNOSIS — F17210 Nicotine dependence, cigarettes, uncomplicated: Secondary | ICD-10-CM | POA: Diagnosis present

## 2023-03-09 DIAGNOSIS — F419 Anxiety disorder, unspecified: Secondary | ICD-10-CM | POA: Diagnosis present

## 2023-03-09 DIAGNOSIS — J439 Emphysema, unspecified: Secondary | ICD-10-CM | POA: Diagnosis present

## 2023-03-09 DIAGNOSIS — J9621 Acute and chronic respiratory failure with hypoxia: Secondary | ICD-10-CM | POA: Diagnosis present

## 2023-03-09 DIAGNOSIS — K219 Gastro-esophageal reflux disease without esophagitis: Secondary | ICD-10-CM | POA: Diagnosis present

## 2023-03-09 DIAGNOSIS — E669 Obesity, unspecified: Secondary | ICD-10-CM | POA: Diagnosis present

## 2023-03-09 DIAGNOSIS — Z8673 Personal history of transient ischemic attack (TIA), and cerebral infarction without residual deficits: Secondary | ICD-10-CM | POA: Diagnosis not present

## 2023-03-09 DIAGNOSIS — Z8249 Family history of ischemic heart disease and other diseases of the circulatory system: Secondary | ICD-10-CM | POA: Diagnosis not present

## 2023-03-09 DIAGNOSIS — G8929 Other chronic pain: Secondary | ICD-10-CM | POA: Diagnosis present

## 2023-03-09 DIAGNOSIS — D631 Anemia in chronic kidney disease: Secondary | ICD-10-CM | POA: Diagnosis present

## 2023-03-09 DIAGNOSIS — G4733 Obstructive sleep apnea (adult) (pediatric): Secondary | ICD-10-CM | POA: Diagnosis present

## 2023-03-09 DIAGNOSIS — E8770 Fluid overload, unspecified: Secondary | ICD-10-CM | POA: Diagnosis present

## 2023-03-09 DIAGNOSIS — N2581 Secondary hyperparathyroidism of renal origin: Secondary | ICD-10-CM | POA: Diagnosis present

## 2023-03-09 LAB — CREATININE, SERUM
Creatinine, Ser: 3.69 mg/dL — ABNORMAL HIGH (ref 0.61–1.24)
GFR, Estimated: 18 mL/min — ABNORMAL LOW (ref >60)

## 2023-03-09 LAB — HEPATITIS B SURFACE ANTIGEN: Hepatitis B Surface Ag: NONREACTIVE

## 2023-03-09 LAB — URINE DRUG SCREEN, QUALITATIVE (ARMC ONLY)
Amphetamines, Ur Screen: POSITIVE — AB
Barbiturates, Ur Screen: NOT DETECTED
Benzodiazepine, Ur Scrn: NOT DETECTED
Cannabinoid 50 Ng, Ur ~~LOC~~: POSITIVE — AB
Cocaine Metabolite,Ur ~~LOC~~: NOT DETECTED
MDMA (Ecstasy)Ur Screen: NOT DETECTED
Methadone Scn, Ur: NOT DETECTED
Opiate, Ur Screen: NOT DETECTED
Phencyclidine (PCP) Ur S: NOT DETECTED
Tricyclic, Ur Screen: NOT DETECTED

## 2023-03-09 LAB — CBG MONITORING, ED: Glucose-Capillary: 154 mg/dL — ABNORMAL HIGH (ref 70–99)

## 2023-03-09 LAB — GLUCOSE, CAPILLARY
Glucose-Capillary: 141 mg/dL — ABNORMAL HIGH (ref 70–99)
Glucose-Capillary: 121 mg/dL — ABNORMAL HIGH (ref 70–99)
Glucose-Capillary: 80 mg/dL (ref 70–99)

## 2023-03-09 MED ORDER — HYDRALAZINE HCL 50 MG PO TABS
100.0000 mg | ORAL_TABLET | Freq: Two times a day (BID) | ORAL | Status: DC
  Administered 2023-03-09 – 2023-03-10 (×4): 100 mg via ORAL
  Filled 2023-03-09 (×4): qty 2

## 2023-03-09 MED ORDER — ASPIRIN 81 MG PO TBEC
81.0000 mg | DELAYED_RELEASE_TABLET | Freq: Every day | ORAL | Status: DC
  Administered 2023-03-09 – 2023-03-10 (×2): 81 mg via ORAL
  Filled 2023-03-09 (×2): qty 1

## 2023-03-09 MED ORDER — CHLORHEXIDINE GLUCONATE CLOTH 2 % EX PADS
6.0000 | MEDICATED_PAD | Freq: Every day | CUTANEOUS | Status: DC
  Administered 2023-03-09 – 2023-03-10 (×2): 6 via TOPICAL

## 2023-03-09 MED ORDER — FUROSEMIDE 40 MG PO TABS: 40.0000 mg | ORAL_TABLET | Freq: Two times a day (BID) | ORAL | Status: DC

## 2023-03-09 MED ORDER — ALBUTEROL SULFATE (2.5 MG/3ML) 0.083% IN NEBU: 2.5000 mg | INHALATION_SOLUTION | Freq: Four times a day (QID) | RESPIRATORY_TRACT | Status: DC | PRN

## 2023-03-09 MED ORDER — CARVEDILOL 25 MG PO TABS
25.0000 mg | ORAL_TABLET | Freq: Two times a day (BID) | ORAL | Status: DC
  Administered 2023-03-09 – 2023-03-10 (×3): 25 mg via ORAL
  Filled 2023-03-09 (×3): qty 1

## 2023-03-09 MED ORDER — AMLODIPINE BESYLATE 10 MG PO TABS
10.0000 mg | ORAL_TABLET | Freq: Every day | ORAL | Status: DC
  Administered 2023-03-09 – 2023-03-10 (×2): 10 mg via ORAL
  Filled 2023-03-09 (×2): qty 1

## 2023-03-09 MED ORDER — DULOXETINE HCL 30 MG PO CPEP
60.0000 mg | ORAL_CAPSULE | Freq: Every day | ORAL | Status: DC
  Administered 2023-03-09 – 2023-03-10 (×2): 60 mg via ORAL
  Filled 2023-03-09 (×2): qty 2

## 2023-03-09 MED ORDER — TIZANIDINE HCL 2 MG PO TABS
2.0000 mg | ORAL_TABLET | Freq: Three times a day (TID) | ORAL | Status: DC | PRN
  Administered 2023-03-10: 2 mg via ORAL
  Filled 2023-03-09 (×3): qty 1

## 2023-03-09 MED ORDER — LIDOCAINE-PRILOCAINE 2.5-2.5 % EX CREA
1.0000 | TOPICAL_CREAM | Freq: Every day | CUTANEOUS | Status: DC
  Administered 2023-03-10: 1 via TOPICAL
  Filled 2023-03-09: qty 5

## 2023-03-09 MED ORDER — HYDRALAZINE HCL 20 MG/ML IJ SOLN: 5.0000 mg | INTRAMUSCULAR | Status: DC | PRN

## 2023-03-09 MED ORDER — TOPIRAMATE 25 MG PO TABS
50.0000 mg | ORAL_TABLET | Freq: Two times a day (BID) | ORAL | Status: DC
  Administered 2023-03-09 – 2023-03-10 (×3): 50 mg via ORAL
  Filled 2023-03-09 (×3): qty 2

## 2023-03-09 MED ORDER — LOSARTAN POTASSIUM 50 MG PO TABS
100.0000 mg | ORAL_TABLET | Freq: Every day | ORAL | Status: DC
  Administered 2023-03-09 – 2023-03-10 (×2): 100 mg via ORAL
  Filled 2023-03-09 (×2): qty 2

## 2023-03-09 MED ORDER — TORSEMIDE 20 MG PO TABS
40.0000 mg | ORAL_TABLET | Freq: Every day | ORAL | Status: DC
  Administered 2023-03-09 – 2023-03-10 (×2): 40 mg via ORAL
  Filled 2023-03-09 (×2): qty 2

## 2023-03-09 MED ORDER — PANTOPRAZOLE SODIUM 40 MG PO TBEC
40.0000 mg | DELAYED_RELEASE_TABLET | Freq: Two times a day (BID) | ORAL | Status: DC
  Administered 2023-03-09 – 2023-03-10 (×3): 40 mg via ORAL
  Filled 2023-03-09 (×3): qty 1

## 2023-03-09 MED ORDER — HEPARIN SODIUM (PORCINE) 1000 UNIT/ML IJ SOLN
INTRAMUSCULAR | Status: AC
  Filled 2023-03-09: qty 10

## 2023-03-09 NOTE — Progress Notes (Signed)
[facility]  ROUNDING NOTE   Subjective:   [name] was admitted to [facility] on 03/08/2023 for Acute on chronic diastolic (congestive) heart failure [I50.33]  Last hemodialysis treatment was 3/29. Patient was 1kg above his estimated dry weight post treatment. Patient states he was having shortness of breath due to this.   Patient endorses gastroenteritis with diarrhea, vomiting, and poor PO intake.   He was found to have shortness of breath with hypoxia on admission. Placed on supplemental oxygen. Nephrology consulted for dialysis management.   Seen and examined on hemodialysis treatment. Tolerating treatment well. UF goal of 2 liters.     HEMODIALYSIS FLOWSHEET:  Blood Flow Rate (mL/min): 400 mL/min Arterial Pressure (mmHg): -220 mmHg Venous Pressure (mmHg): 210 mmHg TMP (mmHg): 5 mmHg Ultrafiltration Rate (mL/min): 828 mL/min Dialysate Flow Rate (mL/min): 300 ml/min    Objective:  Vital signs in last 24 hours:  Temp:  [97.6 F (36.4 C)-98.6 F (37 C)] 98.6 F (37 C) (03/30 0930) Pulse Rate:  [75-103] 89 (03/30 1100) Resp:  [17-24] 20 (03/30 1100) BP: (104-222)/(59-126) 104/59 (03/30 1100) SpO2:  [97 %-100 %] 100 % (03/30 1100) Weight:  [90.7 kg-99.4 kg] 99.4 kg (03/30 0945)  Weight change:  Filed Weights   03/08/23 1655 03/09/23 0059 03/09/23 0945  Weight: 90.7 kg 98.6 kg 99.4 kg    Intake/Output: I/O last 3 completed shifts: In: -  Out: 380 [Urine:380]   Intake/Output this shift:  Total I/O In: 240 [P.O.:240] Out: -   Physical Exam: General: NAD, laying in bed  Head: Normocephalic, atraumatic. Moist oral mucosal membranes  Eyes: Anicteric, PERRL  Neck: Supple, trachea midline  Lungs:  Clear to auscultation, 3L [place]  Heart: Regular rate and rhythm  Abdomen:  Soft, nontender,   Extremities:  trace peripheral edema.  Neurologic: Nonfocal, moving all four extremities  Skin: No lesions  Access: Left forearm AVF    Basic Metabolic  Panel: Recent Labs  Lab 03/08/23 1708 03/08/23 2230  NA 138  --   K 3.5  --   CL 96*  --   CO2 27  --   GLUCOSE 112*  --   BUN 20  --   CREATININE 3.03* 3.69*  CALCIUM 8.5*  --     Liver Function Tests: No results for input(s): "AST", "ALT", "ALKPHOS", "BILITOT", "PROT", "ALBUMIN" in the last 168 hours. No results for input(s): "LIPASE", "AMYLASE" in the last 168 hours. No results for input(s): "AMMONIA" in the last 168 hours.  CBC: Recent Labs  Lab 03/08/23 1708 03/08/23 2230  WBC 7.7 9.0  HGB 13.5 12.8*  HCT 40.2 38.7*  MCV 87.4 90.2  PLT 315 297    Cardiac Enzymes: No results for input(s): "CKTOTAL", "CKMB", "CKMBINDEX", "TROPONINI" in the last 168 hours.  BNP: Invalid input(s): "POCBNP"  CBG: Recent Labs  Lab 03/08/23 2353 03/09/23 0920  GLUCAP 154* 141*    Microbiology: Results for orders placed or performed during the hospital encounter of 01/05/23  Resp panel by RT-PCR (RSV, Flu A&B, Covid) Anterior Nasal Swab     Status: None   Collection Time: 01/05/23 12:57 PM   Specimen: Anterior Nasal Swab  Result Value Ref Range Status   SARS Coronavirus 2 by RT PCR NEGATIVE NEGATIVE Final    Comment: (NOTE) SARS-CoV-2 target nucleic acids are NOT DETECTED.  The SARS-CoV-2 RNA is generally detectable in upper respiratory specimens during the acute phase of infection. The lowest concentration of SARS-CoV-2 viral copies this assay  can detect is 138 copies/mL. A negative result does not preclude SARS-Cov-2 infection and should not be used as the sole basis for treatment or other patient management decisions. A negative result may occur with  improper specimen collection/handling, submission of specimen other than nasopharyngeal swab, presence of viral mutation(s) within the areas targeted by this assay, and inadequate number of viral copies(<138 copies/mL). A negative result must be combined with clinical observations, patient history, and  epidemiological information. The expected result is Negative.  Fact Sheet for Patients:  EntrepreneurPulse.com.au  Fact Sheet for Healthcare Providers:  IncredibleEmployment.be  This test is no t yet approved or cleared by the [place] FDA and  has been authorized for detection and/or diagnosis of SARS-CoV-2 by FDA under an Emergency Use Authorization (EUA). This EUA will remain  in effect (meaning this test can be used) for the duration of the COVID-19 declaration under Section 564(b)(1) of the Act, 21 U.S.C.section 360bbb-3(b)(1), unless the authorization is terminated  or revoked sooner.       Influenza A by PCR NEGATIVE NEGATIVE Final   Influenza B by PCR NEGATIVE NEGATIVE Final    Comment: (NOTE) The Xpert Xpress SARS-CoV-2/FLU/RSV plus assay is intended as an aid in the diagnosis of influenza from Nasopharyngeal swab specimens and should not be used as a sole basis for treatment. Nasal washings and aspirates are unacceptable for Xpert Xpress SARS-CoV-2/FLU/RSV testing.  Fact Sheet for Patients: EntrepreneurPulse.com.au  Fact Sheet for Healthcare Providers: IncredibleEmployment.be  This test is not yet approved or cleared by the [place] FDA and has been authorized for detection and/or diagnosis of SARS-CoV-2 by FDA under an Emergency Use Authorization (EUA). This EUA will remain in effect (meaning this test can be used) for the duration of the COVID-19 declaration under Section 564(b)(1) of the Act, 21 U.S.C. section 360bbb-3(b)(1), unless the authorization is terminated or revoked.     Resp Syncytial Virus by PCR NEGATIVE NEGATIVE Final    Comment: (NOTE) Fact Sheet for Patients: EntrepreneurPulse.com.au  Fact Sheet for Healthcare Providers: IncredibleEmployment.be  This test is not yet approved or cleared by the [place] FDA and has been  authorized for detection and/or diagnosis of SARS-CoV-2 by FDA under an Emergency Use Authorization (EUA). This EUA will remain in effect (meaning this test can be used) for the duration of the COVID-19 declaration under Section 564(b)(1) of the Act, 21 U.S.C. section 360bbb-3(b)(1), unless the authorization is terminated or revoked.  Performed at [facility], [address] [place], [place] [address]     Coagulation Studies: No results for input(s): "LABPROT", "INR" in the last 72 hours.  Urinalysis: No results for input(s): "COLORURINE", "LABSPEC", "PHURINE", "GLUCOSEU", "HGBUR", "BILIRUBINUR", "KETONESUR", "PROTEINUR", "UROBILINOGEN", "NITRITE", "LEUKOCYTESUR" in the last 72 hours.  Invalid input(s): "APPERANCEUR"    Imaging: CT ABDOMEN PELVIS WO CONTRAST  Result Date: 03/08/2023 CLINICAL DATA:  Abdominal pain, acute, nonlocalized EXAM: CT ABDOMEN AND PELVIS WITHOUT CONTRAST TECHNIQUE: Multidetector CT imaging of the abdomen and pelvis was performed following the standard protocol without IV contrast. RADIATION DOSE REDUCTION: This exam was performed according to the departmental dose-optimization program which includes automated exposure control, adjustment of the mA and/or kV according to patient size and/or use of iterative reconstruction technique. COMPARISON:  11/25/2021 FINDINGS: Lower chest: No acute abnormality Hepatobiliary: No focal hepatic abnormality. Gallbladder unremarkable. Pancreas: No focal abnormality or ductal dilatation. Spleen: No focal abnormality.  Normal size. Adrenals/Urinary Tract: No adrenal abnormality. No focal renal abnormality. No stones or hydronephrosis. Urinary bladder is  unremarkable. Stomach/Bowel: Normal appendix. Stomach, large and small bowel grossly unremarkable. Vascular/Lymphatic: Aortic atherosclerosis. No evidence of aneurysm or adenopathy. Reproductive: No visible focal abnormality. Other: No free fluid or free air. Musculoskeletal:  No acute bony abnormality. IMPRESSION: No acute findings in the abdomen or pelvis. Aortic atherosclerosis. Electronically Signed   By: Rolm Baptise M.D.   On: 03/08/2023 22:15   DG Chest Portable 1 View  Result Date: 03/08/2023 CLINICAL DATA:  Shortness of breath EXAM: PORTABLE CHEST 1 VIEW COMPARISON:  01/05/2023 FINDINGS: Right-sided central venous catheter tips at the [facility] and cavoatrial region. Diffusely increased interstitial and hazy pulmonary opacity. Possible tiny right effusion. Stable cardiomediastinal silhouette. No pneumothorax IMPRESSION: Diffusely increased interstitial and hazy pulmonary opacity, suspect for edema. Possible tiny right effusion. Electronically Signed   By: Donavan Foil M.D.   On: 03/08/2023 17:57     Medications:     amLODipine  10 mg Oral Daily   aspirin EC  81 mg Oral Daily   carvedilol  25 mg Oral BID WC   Chlorhexidine Gluconate Cloth  6 each Topical Daily   DULoxetine  60 mg Oral Daily   heparin  5,000 Units Subcutaneous Q8H   hydrALAZINE  100 mg Oral BID   insulin aspart  0-6 Units Subcutaneous TID WC   lidocaine-prilocaine  1 Application Topical Daily   losartan  100 mg Oral Daily   pantoprazole  40 mg Oral BID   topiramate  50 mg Oral BID   torsemide  40 mg Oral Daily   acetaminophen **OR** acetaminophen, albuterol, hydrALAZINE, ondansetron **OR** ondansetron (ZOFRAN) IV  Assessment/ Plan:  [name] is a 57 y.o.  male with end stage renal disease on hemodialysis, hypertension, COPD, congestive heart failure, hyperlipidemia, sleep apnea, diabetes mellitus type II who was admitted to [facility] on 03/08/2023 for  Acute on chronic diastolic (congestive) heart failure [I50.33]  [facility] TTS [address] Left forearm AVF 100.5kg  End Stage Renal Disease: seen and examined on hemodialysis treatment. Tolerating treatment well. Patient below his outpatient estimated dry weight however appears to be hypervolemic, challenge with 2 liters  ultrafiltration. Continue TTS schedule. Monitor daily for dialysis need.   Hypertension: home regimen amlodipine, carvedilol, hydralazine, losartan and torsemide.   Anemia of chronic kidney disease: hemoglobin 12.8. No indication for ESA.   Secondary Hyperparathyroidism: outpatient labs are stable and at goal. Not currently on phosphate binders.    LOS: 0 Sanjna Haskew 3/30/202411:20 AM

## 2023-03-09 NOTE — Discharge Summary (Incomplete)
Physician Discharge Summary  Patient: Derek Cooley. [id number] DOB: 06/06/1966   Code Status: Full Code Admit date: 03/08/2023 Discharge date: 03/10/2023 Disposition: Home, No home health services recommended PCP: Jon Billings, NP  Recommendations for Outpatient Follow-up:  Follow up with PCP within 1-2 weeks Regarding general hospital follow up and preventative care Recommend discussing and evaluating oxygen use. He was regularly taking off his oxygen when eating while inpatient.  Follow up with nephrology for regularly scheduled HD  Discharge Diagnoses:  Principal Problem:   Acute on chronic diastolic (congestive) heart failure (HCC) Active Problems:   Acute on chronic respiratory failure (HCC)   Acute gastroenteritis   Chronic obstructive pulmonary disease (COPD) (HCC)   Type II diabetes mellitus with renal manifestations (HCC)   Depression with anxiety   Mild cognitive disorder   OSA (obstructive sleep apnea)   ESRD on hemodialysis (HCC)   Polysubstance abuse (HCC)   Hypoxia   SOB (shortness of breath)   Hypervolemia   ESRD (end stage renal disease) on dialysis [facility])  Brief Hospital Course Summary: Derek Cooley. is a 57 y.o. male with medical history significant for COPD, end-stage renal disease on hemodialysis Tuesday, Thursday Saturday, chronic respiratory failure on 3 L of oxygen by nasal cannula, hypertension, OSA on CPAP,  mild cognitive impairment, prior stroke, anxiety and depression, substance abuse, presents to the ED from dialysis with shortness of breath that developed towards the end of his dialysis session.  Patient admits to missing a couple dialysis sessions the week prior due to being ill with a stomach virus.  Specifically, he has been having vomiting and diarrhea for the past week associated with generalized malaise and shortness of breath.  He denied cough, fever or chills.  Denies abdominal pain.  EMS reports an O2 sat of 86 to 88% on  usual oxygen requiring 6 L for transport.  ED course and data review: BP 166/105, O2 sat at 100% on O2 at 6 L.  VBG unremarkable.  Troponin 50 and BNP 195.  CBC and BMP and lactic acid unremarkable.EKG personally viewed and interpreted showing sinus rhythm at 75 with no acute ST-T wave changes.  Chest x-ray shows diffusely increased interstitial and hazy pulmonary opacities suspect for edema and a tiny right pleural effusion. In the ED provider spoke with nephrologist, Dr. Juleen China who advised that patient will be dialyzed first thing in the a.m.  Hospitalist consulted for admission.  Discharge Condition: Good, improved Recommended discharge diet: Renal diet  Consultations: Nephrology   Procedures/Studies: HD  Allergies as of 03/10/2023       Reactions   Atorvastatin    Joint Aches - Severe Joint Aches - Severe Joint Aches - Severe   Avelox [moxifloxacin Hcl In Nacl]    Muscle pain   Buprenorphine    Mouth sores, confusion, shaking   Dilaudid [hydromorphone Hcl] Hives   Fluoxetine Itching   Levofloxacin Other (See Comments)   Joint Pain   Morphine And Related    Other    Muscle pain   Suboxone [buprenorphine Hcl-naloxone Hcl] Other (See Comments)   Rash and confused   Vancomycin    Renal insufficiency        Medication List     TAKE these medications    albuterol 108 (90 Base) MCG/ACT inhaler Commonly known as: VENTOLIN HFA Inhale 1-2 puffs into the lungs every 6 (six) hours as needed for wheezing or shortness of breath.   amLODipine 10 MG tablet Commonly  known as: NORVASC TAKE 1 TABLET(10 MG) BY MOUTH DAILY   aspirin EC 81 MG tablet Take 1 tablet (81 mg total) by mouth daily. Swallow whole.   carvedilol 25 MG tablet Commonly known as: COREG TAKE 1 TABLET(25 MG) BY MOUTH TWICE DAILY WITH A MEAL   DULoxetine 60 MG capsule Commonly known as: CYMBALTA Take 1 capsule (60 mg total) by mouth daily.   hydrALAZINE 100 MG tablet Commonly known as: APRESOLINE Take  100 mg by mouth 2 (two) times daily.   lidocaine-prilocaine cream Commonly known as: EMLA Apply 1 Application topically daily.   losartan 100 MG tablet Commonly known as: COZAAR Take 100 mg by mouth daily.   multivitamin Tabs tablet Take 1 tablet by mouth daily.   mupirocin cream 2 % Commonly known as: BACTROBAN Apply 1 Application topically 2 (two) times daily.   nicotine 21 mg/24hr patch Commonly known as: NICODERM CQ - dosed in mg/24 hours Place 1 patch (21 mg total) onto the skin daily as needed (nicotine craving).   OneTouch Ultra test strip Generic drug: glucose blood USE TO TEST BLOOD SUGAR DAILY   OXYGEN Inhale 3 L into the lungs daily.   pantoprazole 40 MG tablet Commonly known as: PROTONIX TAKE 1 TABLET(40 MG) BY MOUTH TWICE DAILY AS NEEDED   topiramate 50 MG tablet Commonly known as: TOPAMAX TAKE 1 TABLET(50 MG) BY MOUTH TWICE DAILY   Torsemide 40 MG Tabs Take 40 mg by mouth daily. What changed:  medication strength See the new instructions.         Subjective   Pt reports ***  All questions and concerns were addressed at time of discharge.  Objective  Blood pressure (!) 164/92, pulse 79, temperature 98 F (36.7 C), resp. rate 18, height 5\' 7"  (1.702 m), weight 97.7 kg, SpO2 98 %.   General: Pt is alert, awake, not in acute distress Cardiovascular: RRR, S1/S2 +, no rubs, no gallops Respiratory: CTA bilaterally, no wheezing, no rhonchi Abdominal: Soft, NT, ND, bowel sounds + Extremities: no edema, no cyanosis  The results of significant diagnostics from this hospitalization (including imaging, microbiology, ancillary and laboratory) are listed below for reference.   Imaging studies: CT ABDOMEN PELVIS WO CONTRAST  Result Date: 03/08/2023 CLINICAL DATA:  Abdominal pain, acute, nonlocalized EXAM: CT ABDOMEN AND PELVIS WITHOUT CONTRAST TECHNIQUE: Multidetector CT imaging of the abdomen and pelvis was performed following the standard protocol  without IV contrast. RADIATION DOSE REDUCTION: This exam was performed according to the departmental dose-optimization program which includes automated exposure control, adjustment of the mA and/or kV according to patient size and/or use of iterative reconstruction technique. COMPARISON:  11/25/2021 FINDINGS: Lower chest: No acute abnormality Hepatobiliary: No focal hepatic abnormality. Gallbladder unremarkable. Pancreas: No focal abnormality or ductal dilatation. Spleen: No focal abnormality.  Normal size. Adrenals/Urinary Tract: No adrenal abnormality. No focal renal abnormality. No stones or hydronephrosis. Urinary bladder is unremarkable. Stomach/Bowel: Normal appendix. Stomach, large and small bowel grossly unremarkable. Vascular/Lymphatic: Aortic atherosclerosis. No evidence of aneurysm or adenopathy. Reproductive: No visible focal abnormality. Other: No free fluid or free air. Musculoskeletal: No acute bony abnormality. IMPRESSION: No acute findings in the abdomen or pelvis. Aortic atherosclerosis. Electronically Signed   By: Rolm Baptise M.D.   On: 03/08/2023 22:15   DG Chest Portable 1 View  Result Date: 03/08/2023 CLINICAL DATA:  Shortness of breath EXAM: PORTABLE CHEST 1 VIEW COMPARISON:  01/05/2023 FINDINGS: Right-sided central venous catheter tips at the [facility] and cavoatrial region. Diffusely increased interstitial  and hazy pulmonary opacity. Possible tiny right effusion. Stable cardiomediastinal silhouette. No pneumothorax IMPRESSION: Diffusely increased interstitial and hazy pulmonary opacity, suspect for edema. Possible tiny right effusion. Electronically Signed   By: Donavan Foil M.D.   On: 03/08/2023 17:57   VAS [place] ABI WITH/WO TBI  Result Date: 03/06/2023  LOWER EXTREMITY DOPPLER STUDY Patient Name:  DASHIEL WESSMAN  Date of Exam:   03/06/2023 Medical Rec #: [id number]       Accession #:    [id number] Date of Birth: 12/06/1966      Patient Gender: M Patient Age:   32 years Exam Location:   [facility] Procedure:      VAS [place] ABI WITH/WO TBI Referring Phys: --------------------------------------------------------------------------------  Indications: Rest pain. High Risk Factors: Diabetes.  Performing Technologist: Concha Norway RVT  Examination Guidelines: A complete evaluation includes at minimum, Doppler waveform signals and systolic blood pressure reading at the level of bilateral brachial, anterior tibial, and posterior tibial arteries, when vessel segments are accessible. Bilateral testing is considered an integral part of a complete examination. Photoelectric Plethysmograph (PPG) waveforms and toe systolic pressure readings are included as required and additional duplex testing as needed. Limited examinations for reoccurring indications may be performed as noted.  ABI Findings: +---------+------------------+-----+---------+--------+ Right    Rt Pressure (mmHg)IndexWaveform Comment  +---------+------------------+-----+---------+--------+ Brachial 209                                      +---------+------------------+-----+---------+--------+ ATA                             triphasicNonComp  +---------+------------------+-----+---------+--------+ PTA                             triphasicNonComp  +---------+------------------+-----+---------+--------+ Great Toe193               0.92 Normal            +---------+------------------+-----+---------+--------+ +---------+------------------+-----+---------+-------+ Left     Lt Pressure (mmHg)IndexWaveform Comment +---------+------------------+-----+---------+-------+ Brachial 200                                     +---------+------------------+-----+---------+-------+ ATA                             triphasicNonComp +---------+------------------+-----+---------+-------+ PTA                             triphasicNonComp +---------+------------------+-----+---------+-------+ Great Toe198                0.95 Normal           +---------+------------------+-----+---------+-------+  Summary: Right: Resting right ankle-brachial index indicates noncompressible right lower extremity arteries. The right toe-brachial index is normal. Left: Resting left ankle-brachial index indicates noncompressible left lower extremity arteries. The left toe-brachial index is normal. *See table(s) above for measurements and observations.  Electronically signed by Leotis Pain MD on 03/06/2023 at 4:29:45 PM.    Final    VAS [place] (AVF, AVG)  Result Date: 03/06/2023 DIALYSIS ACCESS Patient Name:  Stoddard Towne.  Date of Exam:   03/06/2023 Medical Rec #: [id number]  Accession #:    [id number] Date of Birth: 1966-06-12          Patient Gender: M Patient Age:   2 years Exam Location:  [facility] Procedure:      VAS US DUPLEX DIALYSIS ACCESS (AVF, AVG) Referring Phys: Eulogio Ditch --------------------------------------------------------------------------------  Reason for Exam: Routine follow up. Access Site: Left Upper Extremity. Access Type: Radial-cephalic AVF. History: 07/16/2022 Percutaneous transluminal angioplasty of the left cephalic          vein at the antecubital fossa. Performing Technologist: Concha Norway RVT  Examination Guidelines: A complete evaluation includes B-mode imaging, spectral Doppler, color Doppler, and power Doppler as needed of all accessible portions of each vessel. Unilateral testing is considered an integral part of a complete examination. Limited examinations for reoccurring indications may be performed as noted.  Findings: +--------------------+----------+-----------------+--------+ AVF                 PSV (cm/s)Flow Vol (mL/min)Comments +--------------------+----------+-----------------+--------+ Native artery inflow   232           971                +--------------------+----------+-----------------+--------+ AVF Anastomosis        360                               +--------------------+----------+-----------------+--------+  +------------+----------+-------------+----------+--------+ OUTFLOW VEINPSV (cm/s)Diameter (cm)Depth (cm)Describe +------------+----------+-------------+----------+--------+ Mid UA                                       basilic  +------------+----------+-------------+----------+--------+ Dist UA                                      basilic  +------------+----------+-------------+----------+--------+ AC Fossa        75                                    +------------+----------+-------------+----------+--------+ Prox Forearm   128                                    +------------+----------+-------------+----------+--------+ Mid Forearm     84                                    +------------+----------+-------------+----------+--------+ Dist Forearm   348                                    +------------+----------+-------------+----------+--------+  +-------------------+-------------+---------+---------+----------+-------------+                    Diameter (cm)  Depth  BranchingPSV (cm/s) Flow Volume                                    (cm)                        (ml/min)    +-------------------+-------------+---------+---------+----------+-------------+  Lt Rad Art Dis to                                     58                  anast                                                                     +-------------------+-------------+---------+---------+----------+-------------+ retrograde                                                                +-------------------+-------------+---------+---------+----------+-------------+  Summary: Patent arteriovenous fistula. *See table(s) above for measurements and observations.  Diagnosing physician: Leotis Pain MD Electronically signed by Leotis Pain MD on 03/06/2023 at 4:29:39 PM.    --------------------------------------------------------------------------------   Final     Labs: Basic Metabolic Panel: Recent Labs  Lab 03/08/23 1708 03/08/23 2230  NA 138  --   K 3.5  --   CL 96*  --   CO2 27  --   GLUCOSE 112*  --   BUN 20  --   CREATININE 3.03* 3.69*  CALCIUM 8.5*  --    CBC: Recent Labs  Lab 03/08/23 1708 03/08/23 2230  WBC 7.7 9.0  HGB 13.5 12.8*  HCT 40.2 38.7*  MCV 87.4 90.2  PLT 315 297   Microbiology: ***  Time coordinating discharge: Over 30 minutes  Richarda Osmond, MD  [facility] 03/10/2023, 9:17 AM

## 2023-03-09 NOTE — Evaluation (Signed)
Physical Therapy Evaluation Patient Details Name: Derek Cooley. MRN: [id number] DOB: [birth year] Today's Date: 03/09/2023  History of Present Illness  Derek Cooley. is a 57 y.o. male with medical history significant for COPD, end-stage renal disease on hemodialysis Tuesday, Thursday Saturday, chronic respiratory failure on 3 L of oxygen by nasal cannula, hypertension, OSA on CPAP,  mild cognitive impairment, prior stroke, anxiety and depression, substance abuse, presents to the ED from dialysis with shortness of breath that developed towards the end of his dialysis session.   Clinical Impression  Patient received sitting up in bed and agreeable to PT. Patient lives with his cousin in a single story home with 3-4 steps to enter with a handrail. He uses 3L/min O2 at baseline and was I with all aspects of care and mobility except driving prior to hospitalization. Upon PT evaluation, patient was mod I for bed mobility and transfers and needed supervision for ambulation approx 180 feet with no AD. Patient does not appear to have any further PT needs at this time. Will sign off on PT order.      Recommendations for follow up therapy are one component of a multi-disciplinary discharge planning process, led by the attending physician.  Recommendations may be updated based on patient status, additional functional criteria and insurance authorization.  Follow Up Recommendations       Assistance Recommended at Discharge Intermittent Supervision/Assistance  Patient can return home with the following  Help with stairs or ramp for entrance    Equipment Recommendations None recommended by PT  Recommendations for Other Services       Functional Status Assessment       Precautions / Restrictions Precautions Precautions: Fall Restrictions Weight Bearing Restrictions: No      Mobility  Bed Mobility Overal bed mobility: Independent                  Transfers Overall transfer  level: Modified independent Equipment used: None                    Ambulation/Gait Ambulation/Gait assistance: Supervision Gait Distance (Feet): 180 Feet Assistive device: None Gait Pattern/deviations: WFL(Within Functional Limits) Gait velocity: WFL     General Gait Details: patient ambulated 180 feet with supervision with no loss of balance. Needed cuing for direction of travel occasionally.  Stairs            Wheelchair Mobility    Modified Rankin (Stroke Patients Only)       Balance Overall balance assessment: Modified Independent Sitting-balance support: No upper extremity supported, Feet supported Sitting balance-Leahy Scale: Normal       Standing balance-Leahy Scale: Good                               Pertinent Vitals/Pain Pain Assessment Pain Assessment: Faces Faces Pain Scale: Hurts a little bit Pain Location: stomach, head, arms, "all over" Pain Intervention(s): Limited activity within patient's tolerance, Monitored during session    Home Living Family/patient expects to be discharged to:: Private residence Living Arrangements: Other relatives (cousin) Available Help at Discharge: Family;Available 24 hours/day Type of Home: Mobile home Home Access: Stairs to enter Entrance Stairs-Rails: Can reach both;Left;Right Entrance Stairs-Number of Steps: 3-4   Home Layout: One level Home Equipment: Advice worker (2 wheels);Cane - single point;Wheelchair - manual;BSC/3in1 (oxygen equipment, uses 3L/min at baseline)      Prior Function Prior Level of Function :  Independent/Modified Independent;History of Falls (last six months)             Mobility Comments: Patient reports he was I with mobility using no AD prior to hospitalization. He reports 1 fall int he last 6 months when he tripped. ADLs Comments: Patient reports he was I with all aspects of care and mobility except driving prior to hospitalization.     Hand  Dominance        Extremity/Trunk Assessment   Upper Extremity Assessment Upper Extremity Assessment: Overall WFL for tasks assessed    Lower Extremity Assessment Lower Extremity Assessment: Overall WFL for tasks assessed    Cervical / Trunk Assessment Cervical / Trunk Assessment: Normal  Communication   Communication: No difficulties  Cognition Arousal/Alertness: Awake/alert Behavior During Therapy: WFL for tasks assessed/performed Overall Cognitive Status: Within Functional Limits for tasks assessed                                 General Comments: mildly impulsive        General Comments General comments (skin integrity, edema, etc.): SpO2 remained in high 90s throughout session with 3L/min O2 applied    Exercises Other Exercises Other Exercises: educated patient on discharge reccomendations and role of PT in acute care setting.   Assessment/Plan    PT Assessment Patient does not need any further PT services  PT Problem List         PT Treatment Interventions      PT Goals (Current goals can be found in the Care Plan section)  Acute Rehab PT Goals Patient Stated Goal: to eat dinner and go home PT Goal Formulation: With patient Time For Goal Achievement: 03/23/23 Potential to Achieve Goals: Good    Frequency       Co-evaluation               AM-PAC PT "6 Clicks" Mobility  Outcome Measure Help needed turning from your back to your side while in a flat bed without using bedrails?: None Help needed moving from lying on your back to sitting on the side of a flat bed without using bedrails?: None Help needed moving to and from a bed to a chair (including a wheelchair)?: None Help needed standing up from a chair using your arms (e.g., wheelchair or bedside chair)?: None Help needed to walk in hospital room?: None Help needed climbing 3-5 steps with a railing? : None 6 Click Score: 24    End of Session Equipment Utilized During  Treatment: Gait belt;Oxygen (3L/min) Activity Tolerance: Patient tolerated treatment well Patient left: in chair;with chair alarm set;with call bell/phone within reach Nurse Communication: Mobility status PT Visit Diagnosis: Difficulty in walking, not elsewhere classified (R26.2)    Time: [id number] PT Time Calculation (min) (ACUTE ONLY): 15 min   Charges:   PT Evaluation $PT Eval Low Complexity: 1 Low          Tamma Brigandi R. Graylon Good, PT, DPT 03/09/23, 4:29 PM

## 2023-03-09 NOTE — ED Notes (Signed)
Request made for transport to the floor ?

## 2023-03-09 NOTE — Progress Notes (Signed)
PROGRESS NOTE  Derek Cooley.    DOB: 03/23/1966, 57 y.o.  [id number]    Code Status: Full Code   DOA: 03/08/2023   LOS: 0   Brief hospital course  Derek Reali. is a 57 y.o. male with medical history significant for COPD, end-stage renal disease on hemodialysis Tuesday, Thursday Saturday, chronic respiratory failure on 3 L of oxygen by nasal cannula, hypertension, OSA on CPAP,  mild cognitive impairment, prior stroke, anxiety and depression, substance abuse, presents to the ED from dialysis with shortness of breath that developed towards the end of his dialysis session.  Patient admits to missing a couple dialysis sessions the week prior due to being ill with a stomach virus.  Specifically, he has been having vomiting and diarrhea for the past week associated with generalized malaise and shortness of breath.  He denied cough, fever or chills.  Denies abdominal pain.  EMS reports an O2 sat of 86 to 88% on usual oxygen requiring 6 L for transport.  ED course and data review: BP 166/105, O2 sat at 100% on O2 at 6 L.  VBG unremarkable.  Troponin 50 and BNP 195.  CBC and BMP and lactic acid unremarkable.EKG personally viewed and interpreted showing sinus rhythm at 75 with no acute ST-T wave changes.  Chest x-ray shows diffusely increased interstitial and hazy pulmonary opacities suspect for edema and a tiny right pleural effusion. In the ED provider spoke with nephrologist, Dr. Juleen China who advised that patient will be dialyzed first thing in the a.m.  Hospitalist consulted for admission.  03/09/23 -SOB improved. BP improved.  Assessment & Plan  Principal Problem:   Acute on chronic diastolic (congestive) heart failure (HCC) Active Problems:   Acute on chronic respiratory failure (HCC)   Acute gastroenteritis   Chronic obstructive pulmonary disease (COPD) (HCC)   Type II diabetes mellitus with renal manifestations (HCC)   Depression with anxiety   Mild cognitive disorder   OSA  (obstructive sleep apnea)   ESRD on hemodialysis (HCC)   Polysubstance abuse (HCC)  Acute on chronic diastolic (congestive) heart failure (HCC) Acute on chronic respiratory failure - wean to home oxygen level - continue diuresis and HD   Acute gastroenteritis- improved Patient had 1 week of vomiting and diarrhea  Supportive care    Chronic obstructive pulmonary disease (COPD) ([place])- not in acute exacerbation DuoNebs as needed   Type II diabetes mellitus with renal manifestations (HCC) Sliding scale insulin coverage   Depression with anxiety Continue duloxetine   Polysubstance abuse ([place]) UDS   ESRD on hemodialysis [facility]) Nephrology consult for continuation of dialysis. HD went well today.   OSA (obstructive sleep apnea) CPAP nightly   Mild cognitive disorder Delirium precautions  Body mass index is 34.05 kg/m.  VTE ppx: heparin injection 5,000 Units Start: 03/08/23 2215   Diet:     Diet   Diet heart healthy/carb modified Room service appropriate? Yes; Fluid consistency: Thin   Consultants: Nephrology   Subjective 03/09/23    Pt reports feeling improved. Endorses some mild SOB. Chronic pain is present and poorly controlled.    Objective   Vitals:   03/09/23 0059 03/09/23 0246 03/09/23 0404 03/09/23 0730  BP: (!) 222/126 (!) 182/95 (!) 193/91   Pulse: 87 (!) 103 95   Resp: 20  18 (!) 24  Temp: 97.8 F (36.6 C)  98 F (36.7 C)   TempSrc:      SpO2: 97%  98%   Weight: 98.6 kg  Height: 5\' 7"  (1.702 m)       Intake/Output Summary (Last 24 hours) at 03/09/2023 0758 Last data filed at 03/09/2023 0600 Gross per 24 hour  Intake --  Output 380 ml  Net -380 ml   Filed Weights   03/08/23 1655 03/09/23 0059  Weight: 90.7 kg 98.6 kg     Physical Exam:  General: awake, alert, NAD HEENT: atraumatic, clear conjunctiva, anicteric sclera, MMM, hearing grossly normal Respiratory: normal respiratory effort. Cardiovascular: quick capillary refill, normal  S1/S2, RRR, no JVD, murmurs Nervous: A&O x3. no gross focal neurologic deficits, normal speech Extremities: moves all equally, LE pitting edema, normal tone Skin: dry, intact, normal temperature, normal color. No rashes, lesions or ulcers on exposed skin Psychiatry: normal mood, congruent affect  Labs   I have personally reviewed the following labs and imaging studies CBC    Component Value Date/Time   WBC 9.0 03/08/2023 2230   RBC 4.29 03/08/2023 2230   HGB 12.8 (L) 03/08/2023 2230   HGB 10.3 (L) 01/07/2023 1610   HCT 38.7 (L) 03/08/2023 2230   HCT 31.3 (L) 01/07/2023 1610   PLT 297 03/08/2023 2230   PLT 313 01/07/2023 1610   MCV 90.2 03/08/2023 2230   MCV 91 01/07/2023 1610   MCV 87 08/12/2014 1019   MCH 29.8 03/08/2023 2230   MCHC 33.1 03/08/2023 2230   RDW 12.8 03/08/2023 2230   RDW 13.0 01/07/2023 1610   RDW 12.7 08/12/2014 1019   LYMPHSABS 1.6 01/07/2023 1610   LYMPHSABS 2.6 08/12/2014 1019   MONOABS 0.6 01/05/2023 1254   MONOABS 0.6 08/12/2014 1019   EOSABS 0.2 01/07/2023 1610   EOSABS 0.4 08/12/2014 1019   BASOSABS 0.1 01/07/2023 1610   BASOSABS 0.0 08/12/2014 1019      Latest Ref Rng & Units 03/08/2023   10:30 PM 03/08/2023    5:08 PM 01/07/2023    4:10 PM  BMP  Glucose 70 - 99 mg/dL  112  92   BUN 6 - 20 mg/dL  20  50   Creatinine 0.61 - 1.24 mg/dL 3.69  3.03  5.85   BUN/Creat Ratio 9 - 20   9   Sodium 135 - 145 mmol/L  138  142   Potassium 3.5 - 5.1 mmol/L  3.5  5.1   Chloride 98 - 111 mmol/L  96  103   CO2 22 - 32 mmol/L  27  22   Calcium 8.9 - 10.3 mg/dL  8.5  8.6     CT ABDOMEN PELVIS WO CONTRAST  Result Date: 03/08/2023 CLINICAL DATA:  Abdominal pain, acute, nonlocalized EXAM: CT ABDOMEN AND PELVIS WITHOUT CONTRAST TECHNIQUE: Multidetector CT imaging of the abdomen and pelvis was performed following the standard protocol without IV contrast. RADIATION DOSE REDUCTION: This exam was performed according to the departmental dose-optimization program  which includes automated exposure control, adjustment of the mA and/or kV according to patient size and/or use of iterative reconstruction technique. COMPARISON:  11/25/2021 FINDINGS: Lower chest: No acute abnormality Hepatobiliary: No focal hepatic abnormality. Gallbladder unremarkable. Pancreas: No focal abnormality or ductal dilatation. Spleen: No focal abnormality.  Normal size. Adrenals/Urinary Tract: No adrenal abnormality. No focal renal abnormality. No stones or hydronephrosis. Urinary bladder is unremarkable. Stomach/Bowel: Normal appendix. Stomach, large and small bowel grossly unremarkable. Vascular/Lymphatic: Aortic atherosclerosis. No evidence of aneurysm or adenopathy. Reproductive: No visible focal abnormality. Other: No free fluid or free air. Musculoskeletal: No acute bony abnormality. IMPRESSION: No acute findings in the abdomen or pelvis. Aortic  atherosclerosis. Electronically Signed   By: Rolm Baptise M.D.   On: 03/08/2023 22:15   DG Chest Portable 1 View  Result Date: 03/08/2023 CLINICAL DATA:  Shortness of breath EXAM: PORTABLE CHEST 1 VIEW COMPARISON:  01/05/2023 FINDINGS: Right-sided central venous catheter tips at the [facility] and cavoatrial region. Diffusely increased interstitial and hazy pulmonary opacity. Possible tiny right effusion. Stable cardiomediastinal silhouette. No pneumothorax IMPRESSION: Diffusely increased interstitial and hazy pulmonary opacity, suspect for edema. Possible tiny right effusion. Electronically Signed   By: Donavan Foil M.D.   On: 03/08/2023 17:57    Disposition Plan & Communication  Patient status: Inpatient  Admitted From: Home Planned disposition location: Home Anticipated discharge date: 3/31 pending further diuresis  Family Communication: none    Author: Richarda Osmond, DO [facility] 03/09/2023, 7:58 AM   Available by Epic secure chat 7AM-7PM. If 7PM-7AM, please contact night-coverage.  [facility] contact information found on  [email or web address].

## 2023-03-10 DIAGNOSIS — N186 End stage renal disease: Secondary | ICD-10-CM

## 2023-03-10 DIAGNOSIS — R0902 Hypoxemia: Secondary | ICD-10-CM | POA: Diagnosis not present

## 2023-03-10 DIAGNOSIS — R0602 Shortness of breath: Secondary | ICD-10-CM | POA: Diagnosis not present

## 2023-03-10 DIAGNOSIS — I5033 Acute on chronic diastolic (congestive) heart failure: Secondary | ICD-10-CM | POA: Diagnosis not present

## 2023-03-10 LAB — CBC
HCT: 38 % — ABNORMAL LOW (ref 39.0–52.0)
Hemoglobin: 12.5 g/dL — ABNORMAL LOW (ref 13.0–17.0)
MCH: 29.4 pg (ref 26.0–34.0)
MCHC: 32.9 g/dL (ref 30.0–36.0)
MCV: 89.4 fL (ref 80.0–100.0)
Platelets: 273 10*3/uL (ref 150–400)
RBC: 4.25 MIL/uL (ref 4.22–5.81)
RDW: 13 % (ref 11.5–15.5)
WBC: 8 10*3/uL (ref 4.0–10.5)
nRBC: 0 % (ref 0.0–0.2)

## 2023-03-10 LAB — RENAL FUNCTION PANEL
Albumin: 3.4 g/dL — ABNORMAL LOW (ref 3.5–5.0)
Anion gap: 9 (ref 5–15)
BUN: 25 mg/dL — ABNORMAL HIGH (ref 6–20)
CO2: 29 mmol/L (ref 22–32)
Calcium: 8.8 mg/dL — ABNORMAL LOW (ref 8.9–10.3)
Chloride: 100 mmol/L (ref 98–111)
Creatinine, Ser: 4.99 mg/dL — ABNORMAL HIGH (ref 0.61–1.24)
GFR, Estimated: 13 mL/min — ABNORMAL LOW (ref >60)
Glucose, Bld: 117 mg/dL — ABNORMAL HIGH (ref 70–99)
Phosphorus: 4.2 mg/dL (ref 2.5–4.6)
Potassium: 3.7 mmol/L (ref 3.5–5.1)
Sodium: 138 mmol/L (ref 135–145)

## 2023-03-10 LAB — HEPATITIS B SURFACE ANTIBODY, QUANTITATIVE: Hep B S AB Quant (Post): <3.5 m[IU]/mL — ABNORMAL LOW (ref >9.9)

## 2023-03-10 LAB — GLUCOSE, CAPILLARY
Glucose-Capillary: 124 mg/dL — ABNORMAL HIGH (ref 70–99)
Glucose-Capillary: 145 mg/dL — ABNORMAL HIGH (ref 70–99)

## 2023-03-10 MED ORDER — TORSEMIDE 40 MG PO TABS: 40.0000 mg | ORAL_TABLET | Freq: Every day | ORAL | 0 refills | Status: DC

## 2023-03-10 NOTE — Discharge Instructions (Addendum)
Please continue to take your home medications. Your torsemide dose has increased to 40mg  daily.  Continue on with regular scheduled dialysis.   Food Resources  Agency Name: [facility] Agency Address: [address], [place], [place] [address] Phone: [phone] Website: [email or web address]  Service(s) Offered: Housing services, self-sufficiency, congregate meal  program, weatherization program, Administrator, sports program, emergency food assistance,  housing counseling, home ownership program, wheels -towork program. Meals free for 60 and older at various  locations from 9am-1pm, Monday-Friday:  [employer], [place], [place], [address], [place]   [facility], [place] [phone] The [address], [address],   [place], [place]  Agency Name: [facility] on [facility] Address: [place] [address], [place], [place], [place] [address] Phone: [phone] Website: [email or web address] Service(s) Offered: Home delivered hot, frozen, and emergency  meals. Grocery assistance program which matches  volunteers one-on-one with seniors unable to grocery shop  for themselves. Must be 60 years and older; less than 20  hours of in-home aide service, limited or no driving ability;  live alone or with someone with a disability; live in  [place].  Agency Name: Software engineer ([place]) Address: [address] [place], [place] [address] Phone: [phone] Service(s) Offered: Food is served to [place], homeless, elderly, and low  income people in the community every Saturday (11:30  am-12:30 pm) and Sunday (12:30 pm-1:30pm). Volunteers  also offer help and encouragement in seeking employment,  and spiritual guidance. April 04, 2017 8  Agency Name: Department of Social Services Address: [address] [place], [place] [address] Phone: [phone] Service(s) Offered: Child support services; child welfare services; food stamps;  Medicaid; work first family assistance; and aid with fuel,  rent, food and medicine.  Agency Name: Chemical engineer Address: [address] [place], [place] Phone: [phone] Website: [email or web address] Services Offered: Monday 10:00am-12:00, 8:00pm-9:00pm, and Friday  10:00am-12:00. Agency Name: [employer] [facility] [place] Address: [address], [place], [place] [address] Phone: [phone] Website: [email or web address] Service(s) Offered: Serves weekday meals, open from 11:30 am- 1:00 pm., and  6:30-7:30pm, Monday-Wednesday-Friday distributes food  3:30-6pm, Monday-Wednesday-Friday.  Agency Name: [facility] Address: [address], [place], [place] Phone: [phone] Website: [email or web address] Services Offered: Distributes food the 4th Saturday of the month, starting at  8:00 am Agency Name: [facility] Address: [phone] [address], [place], [place] [address] Phone: [phone] Website: [email or web address] Service(s) Offered: Bread of life, weekly food pantry. Open Wednesdays from  10:00am-noon.  Agency Name: [facility] Address: [place], [place], [place] Phone: [phone] Services Offered: Distributes food 9am-1pm, Monday-Thursday. Call for details.   Agency Name: [place] Address: [address], [place], [place] [address] Phone: [phone] Website: [email or web address] Service(s) Offered: Youth worker. Call for assistance. Agency Name: [facility] Address: [address], [place], [place] [address] Phone: [phone] Service Offered: Emergency Food Pantry. Call for appointment.  Agency Name: [place] Address: [address] [place], [place]  [address] Phone: [phone] Website: [email or web address] Services Offered: Youth worker. Call for details Agency Name: [facility] Address: [place], [place] Phone: [phone] Website: [email or web address] Service(s) Offered: Emergency Food Pantry. Call for details.  Agency Name: S[employer] Address: [place]. [address], [place], [place] [address] Phone: [phone] or [phone] Website: [email or web address] Service(s) Offered: Distribute food 9am-11:30  am, Tuesday-Friday, and 1- 3:30pm, Monday-Friday. Food pantry Monday-Friday  1pm-3pm, fresh items, Mon.-Wed.-Fri.  Agency Name: [facility] [facility] Address: [address] [place], [place] [address] Phone: [phone] Website: [email or web address] Services Offered: Distribute food Tues and Sats from 9:00am-noon. Closed  1st Saturday of each month. Call for details   Transportation Agency Name: [facility] Agency Address: [address] [place], [place] [address] Phone: [phone] Email: [email or web address] Website: [email or web address] Service(s) Offered: [employer], self-sufficiency, congregate meal  program, weatherization program, Administrator, sports program, emergency food assistance,  housing counseling, home ownership program, wheels-towork program. April 04, 2017 22  Agency Name: [place] [phone]) Address: [place], [place], [place] [address] Phone: [phone] Email:  Website: [email or web address], [email or web address] Service(s) Offered: Transportation for general public, subscription and demand  response; Dial-a-Ride for citizens 67 years of age or older. Agency Name: Department of Social Services Address: [address] [place], [place] [address] Phone: [phone] Service(s) Offered: Child support services; child welfare services; food stamps;  Medicaid; work first family  assistance; and aid with fuel,  rent, food and medicine, transportation assistance.  Agency Name: [employer] ([place]) Transportation  Network Address: Phone: [phone] Service(s) Offered: Transports veterans to the [facility]. Call  forty-eight hours in advance and leave the name, telephone  number, date, and time of appointment. Veteran will be  contacted by the driver the day before the appointment to  arrange a pick up point    Agency Name: [facility] Agency Address: [address], [place], [place] [address] Phone: [phone] Website: [email or web address] Service(s) Offered: Housing services, self-sufficiency, congregate meal  program, and individual development account program.  Agency Name: [facility] [employer] [facility] [place] Address: [address] [place], [place] [address] Phone: [phone] Email: [email or web address] Website: [email or web address] Service(s) Offered: Housing the homeless, feeding the hungry, Scientific laboratory technician, job and education related services.  Agency Name: [facility] Address: [address] [place], [place] [address] Phone: [phone] Email: [email or web address] Service(s) Offered: Counseling, problem pregnancy, advocacy for Hispanics,  limited emergency financial assistance.  Agency Name: Department of Social Services Address: [address] [place], [place] [address] Phone: [phone] Website: [email or web address] Service(s) Offered: Child support services; child welfare services; SNAPS;  Medicaid; work first family assistance; and aid with fuel,  rent, food and medicine.  Agency Name: [facility] Address: [place]. [address], [place], [place] [address] Phone: [phone] or [phone] Email: [email or web address] Service(s) Offered: Family services and transient assistance; emergency food,  fuel, clothing, limited  furniture, utilities; budget counseling,  general counseling; give a kid a coat; thrift store; Christmas  food and toys. Utility assistance, food pantry, rental  assistance, life sustaining medicine.   Agency Name: [facility] Agency Address: [address], [place], [place] [address] Phone: [phone] Website: [email or web address] Service(s) Offered: Housing services, self-sufficiency, congregate meal  program, and individual development account program.  Agency Name: [employer] [facility] [place] Address: [address], [place], [place] [address] Phone: [phone] Email: [email or web address] Website: [email or web address] Service(s) Offered: Housing the homeless, feeding the hungry, Scientific laboratory technician, job and education related services.  Agency Name: [facility] Address: [address], [place], [place] [address] Phone: [phone] Email: [email or web address] Service(s) Offered: Counseling, problem pregnancy, advocacy for Hispanics,  limited emergency financial assistance.  Agency Name: Department of Social Services Address: [address] [place], [place] [address] Phone: [phone] Website: [email or web address] Service(s) Offered: Child support services; child welfare services; SNAPS;  Medicaid; work first family assistance; and aid with fuel,  rent, food and medicine.  Agency Name: S[facility]itor Address: [place] [place], [place] [address] Phone: [phone] or [phone] Email: [email or web address] Service(s) Offered: Family services and transient assistance; emergency food,  fuel, clothing, limited furniture, utilities; budget counseling,  general counseling; give a kid a coat; thrift store; Christmas  food and toys. Utility assistance, food pantry, rental  assistance, life sustaining medicine

## 2023-03-10 NOTE — Plan of Care (Signed)

## 2023-03-10 NOTE — Progress Notes (Signed)
[facility]  ROUNDING NOTE   Subjective:   [name] was admitted to [facility] on 03/08/2023 for Acute on chronic diastolic (congestive) heart failure [I50.33] Hypervolemia [E87.70]  Hemodialysis treatment yesterday. Tolerated treatment well. UF of 1.5 litres. Post weight was 97.7kg which is significantly lower than outpatient dry weight.    Objective:  Vital signs in last 24 hours:  Temp:  [98 F (36.7 C)-98.7 F (37.1 C)] 98.5 F (36.9 C) (03/31 0734) Pulse Rate:  [70-88] 88 (03/31 0734) Resp:  [15-23] 16 (03/31 0734) BP: (99-165)/(52-96) 129/52 (03/31 0734) SpO2:  [89 %-100 %] 89 % (03/31 0734) Weight:  [97.7 kg] 97.7 kg (03/30 1359)  Weight change: 8.681 kg Filed Weights   03/09/23 0059 03/09/23 0945 03/09/23 1359  Weight: 98.6 kg 99.4 kg 97.7 kg    Intake/Output: I/O last 3 completed shifts: In: 480 [P.O.:480] Out: 1980 [Urine:480; Other:1500]   Intake/Output this shift:  Total I/O In: 240 [P.O.:240] Out: -   Physical Exam: General: NAD, laying in bed  Head: Normocephalic, atraumatic. Moist oral mucosal membranes  Eyes: Anicteric, PERRL  Neck: Supple, trachea midline  Lungs:  Clear to auscultation, 3L [place]  Heart: Regular rate and rhythm  Abdomen:  Soft, nontender,   Extremities:  trace peripheral edema.  Neurologic: Nonfocal, moving all four extremities  Skin: No lesions  Access: Left forearm AVF    Basic Metabolic Panel: Recent Labs  Lab 03/08/23 1708 03/08/23 2230 03/10/23 0908  NA 138  --  138  K 3.5  --  3.7  CL 96*  --  100  CO2 27  --  29  GLUCOSE 112*  --  117*  BUN 20  --  25*  CREATININE 3.03* 3.69* 4.99*  CALCIUM 8.5*  --  8.8*  PHOS  --   --  4.2     Liver Function Tests: Recent Labs  Lab 03/10/23 0908  ALBUMIN 3.4*   No results for input(s): "LIPASE", "AMYLASE" in the last 168 hours. No results for input(s): "AMMONIA" in the last 168 hours.  CBC: Recent Labs  Lab 03/08/23 1708 03/08/23 2230  03/10/23 0908  WBC 7.7 9.0 8.0  HGB 13.5 12.8* 12.5*  HCT 40.2 38.7* 38.0*  MCV 87.4 90.2 89.4  PLT 315 297 273     Cardiac Enzymes: No results for input(s): "CKTOTAL", "CKMB", "CKMBINDEX", "TROPONINI" in the last 168 hours.  BNP: Invalid input(s): "POCBNP"  CBG: Recent Labs  Lab 03/08/23 2353 03/09/23 0920 03/09/23 1508 03/09/23 2043 03/10/23 0625  GLUCAP 154* 141* 121* 1 124*     Microbiology: Results for orders placed or performed during the hospital encounter of 01/05/23  Resp panel by RT-PCR (RSV, Flu A&B, Covid) Anterior Nasal Swab     Status: None   Collection Time: 01/05/23 12:57 PM   Specimen: Anterior Nasal Swab  Result Value Ref Range Status   SARS Coronavirus 2 by RT PCR NEGATIVE NEGATIVE Final    Comment: (NOTE) SARS-CoV-2 target nucleic acids are NOT DETECTED.  The SARS-CoV-2 RNA is generally detectable in upper respiratory specimens during the acute phase of infection. The lowest concentration of SARS-CoV-2 viral copies this assay can detect is 138 copies/mL. A negative result does not preclude SARS-Cov-2 infection and should not be used as the sole basis for treatment or other patient management decisions. A negative result may occur with  improper specimen collection/handling, submission of specimen other than nasopharyngeal swab, presence of viral mutation(s) within the areas targeted by this  assay, and inadequate number of viral copies(<138 copies/mL). A negative result must be combined with clinical observations, patient history, and epidemiological information. The expected result is Negative.  Fact Sheet for Patients:  EntrepreneurPulse.com.au  Fact Sheet for Healthcare Providers:  IncredibleEmployment.be  This test is no t yet approved or cleared by the [place] FDA and  has been authorized for detection and/or diagnosis of SARS-CoV-2 by FDA under an Emergency Use Authorization (EUA). This EUA  will remain  in effect (meaning this test can be used) for the duration of the COVID-19 declaration under Section 564(b)(1) of the Act, 21 U.S.C.section 360bbb-3(b)(1), unless the authorization is terminated  or revoked sooner.       Influenza A by PCR NEGATIVE NEGATIVE Final   Influenza B by PCR NEGATIVE NEGATIVE Final    Comment: (NOTE) The Xpert Xpress SARS-CoV-2/FLU/RSV plus assay is intended as an aid in the diagnosis of influenza from Nasopharyngeal swab specimens and should not be used as a sole basis for treatment. Nasal washings and aspirates are unacceptable for Xpert Xpress SARS-CoV-2/FLU/RSV testing.  Fact Sheet for Patients: EntrepreneurPulse.com.au  Fact Sheet for Healthcare Providers: IncredibleEmployment.be  This test is not yet approved or cleared by the [place] FDA and has been authorized for detection and/or diagnosis of SARS-CoV-2 by FDA under an Emergency Use Authorization (EUA). This EUA will remain in effect (meaning this test can be used) for the duration of the COVID-19 declaration under Section 564(b)(1) of the Act, 21 U.S.C. section 360bbb-3(b)(1), unless the authorization is terminated or revoked.     Resp Syncytial Virus by PCR NEGATIVE NEGATIVE Final    Comment: (NOTE) Fact Sheet for Patients: EntrepreneurPulse.com.au  Fact Sheet for Healthcare Providers: IncredibleEmployment.be  This test is not yet approved or cleared by the [place] FDA and has been authorized for detection and/or diagnosis of SARS-CoV-2 by FDA under an Emergency Use Authorization (EUA). This EUA will remain in effect (meaning this test can be used) for the duration of the COVID-19 declaration under Section 564(b)(1) of the Act, 21 U.S.C. section 360bbb-3(b)(1), unless the authorization is terminated or revoked.  Performed at [facility], [address] [place], [place]  [address]     Coagulation Studies: No results for input(s): "LABPROT", "INR" in the last 72 hours.  Urinalysis: No results for input(s): "COLORURINE", "LABSPEC", "PHURINE", "GLUCOSEU", "HGBUR", "BILIRUBINUR", "KETONESUR", "PROTEINUR", "UROBILINOGEN", "NITRITE", "LEUKOCYTESUR" in the last 72 hours.  Invalid input(s): "APPERANCEUR"    Imaging: CT ABDOMEN PELVIS WO CONTRAST  Result Date: 03/08/2023 CLINICAL DATA:  Abdominal pain, acute, nonlocalized EXAM: CT ABDOMEN AND PELVIS WITHOUT CONTRAST TECHNIQUE: Multidetector CT imaging of the abdomen and pelvis was performed following the standard protocol without IV contrast. RADIATION DOSE REDUCTION: This exam was performed according to the departmental dose-optimization program which includes automated exposure control, adjustment of the mA and/or kV according to patient size and/or use of iterative reconstruction technique. COMPARISON:  11/25/2021 FINDINGS: Lower chest: No acute abnormality Hepatobiliary: No focal hepatic abnormality. Gallbladder unremarkable. Pancreas: No focal abnormality or ductal dilatation. Spleen: No focal abnormality.  Normal size. Adrenals/Urinary Tract: No adrenal abnormality. No focal renal abnormality. No stones or hydronephrosis. Urinary bladder is unremarkable. Stomach/Bowel: Normal appendix. Stomach, large and small bowel grossly unremarkable. Vascular/Lymphatic: Aortic atherosclerosis. No evidence of aneurysm or adenopathy. Reproductive: No visible focal abnormality. Other: No free fluid or free air. Musculoskeletal: No acute bony abnormality. IMPRESSION: No acute findings in the abdomen or pelvis. Aortic atherosclerosis. Electronically Signed   By: Rolm Baptise  M.D.   On: 03/08/2023 22:15   DG Chest Portable 1 View  Result Date: 03/08/2023 CLINICAL DATA:  Shortness of breath EXAM: PORTABLE CHEST 1 VIEW COMPARISON:  01/05/2023 FINDINGS: Right-sided central venous catheter tips at the [facility] and cavoatrial region. Diffusely  increased interstitial and hazy pulmonary opacity. Possible tiny right effusion. Stable cardiomediastinal silhouette. No pneumothorax IMPRESSION: Diffusely increased interstitial and hazy pulmonary opacity, suspect for edema. Possible tiny right effusion. Electronically Signed   By: Donavan Foil M.D.   On: 03/08/2023 17:57     Medications:     amLODipine  10 mg Oral Daily   aspirin EC  81 mg Oral Daily   carvedilol  25 mg Oral BID WC   Chlorhexidine Gluconate Cloth  6 each Topical Daily   DULoxetine  60 mg Oral Daily   heparin  5,000 Units Subcutaneous Q8H   hydrALAZINE  100 mg Oral BID   insulin aspart  0-6 Units Subcutaneous TID WC   lidocaine-prilocaine  1 Application Topical Daily   losartan  100 mg Oral Daily   pantoprazole  40 mg Oral BID   topiramate  50 mg Oral BID   torsemide  40 mg Oral Daily   acetaminophen **OR** acetaminophen, albuterol, hydrALAZINE, ondansetron **OR** ondansetron (ZOFRAN) IV, tiZANidine  Assessment/ Plan:  [name] is a 57 y.o.  male with end stage renal disease on hemodialysis, hypertension, COPD, congestive heart failure, hyperlipidemia, sleep apnea, diabetes mellitus type II who was admitted to [facility] on 03/08/2023 for  Acute on chronic diastolic (congestive) heart failure [I50.33] Hypervolemia [E87.70]  [facility] TTS [address] Left forearm AVF 100.5kg  End Stage Renal Disease: hemodialysis treatment yesterday. Tolerated treatment well. UF of 1.5 liters. Post weight 97.7kg.  - Continue TTS schedule - Change dry weight to 97.5kg   Hypertension: home regimen amlodipine, carvedilol, hydralazine, losartan and torsemide.   Anemia of chronic kidney disease: hemoglobin 12.5. No indication for ESA.   Secondary Hyperparathyroidism: outpatient labs are stable and at goal. Not currently on phosphate binders.    LOS: 1 Derek Cooley 3/31/202411:38 AM

## 2023-03-10 NOTE — TOC Progression Note (Signed)
Transition of Care (TOC) - Progression Note    Patient Details  Name: Derek Cooley. MRN: [id number] Date of Birth: 1966/08/09  Transition of Care [facility]) CM/SW Contact  Izola Price, RN Phone Number: 03/10/2023, 11:11 AM  Clinical Narrative:  3/31: DC pending. No PT follow up recommended. Added SDOH identified resources to AVS summary and requested unit RN to reprint for patient. Simmie Davies RN CM           Expected Discharge Plan and Services         Expected Discharge Date: 03/10/23                                     Social Determinants of Health (SDOH) Interventions SDOH Screenings   Food Insecurity: Food Insecurity Present (03/09/2023)  Housing: Low Risk  (03/09/2023)  Transportation Needs: Unmet Transportation Needs (03/09/2023)  Utilities: At Risk (03/09/2023)  Alcohol Screen: Low Risk  (06/11/2022)  Depression (PHQ2-9): High Risk (01/07/2023)  Financial Resource Strain: Medium Risk (06/11/2022)  Physical Activity: Inactive (06/11/2022)  Social Connections: Socially Isolated (06/11/2022)  Stress: No Stress Concern Present (06/11/2022)  Tobacco Use: High Risk (01/07/2023)    Readmission Risk Interventions    06/25/2022    3:34 PM  Readmission Risk Prevention Plan  Transportation Screening Complete  Medication Review ([place]) Complete  PCP or Specialist appointment within 3-5 days of discharge Complete  SW Recovery Care/Counseling Consult Complete  [place] Not Applicable

## 2023-03-10 NOTE — Progress Notes (Signed)
Pt has DC order. AVS was given and explained to pt, all questions has been answered. Reminded pt to inform the family to bring portable O2, pending family to pick up pt for DC.

## 2023-03-11 ENCOUNTER — Encounter (INDEPENDENT_AMBULATORY_CARE_PROVIDER_SITE_OTHER): Payer: Medicare Other

## 2023-03-11 ENCOUNTER — Telehealth: Payer: Self-pay

## 2023-03-11 NOTE — Transitions of Care (Post Inpatient/ED Visit) (Signed)
   03/11/2023  Name: Derek Cooley. MRN: [id number] DOB: [birth year]  Today's TOC FU Call Status: Today's TOC FU Call Status:: Successful TOC FU Call Competed TOC FU Call Complete Date: 03/11/23  Transition Care Management Follow-up Telephone Call Date of Discharge: 03/10/23 Discharge Facility: [facility]) Type of Discharge: Inpatient Admission Primary Inpatient Discharge Diagnosis:: hypoxemia How have you been since you were released from the hospital?: Better Any questions or concerns?: No  Items Reviewed: Did you receive and understand the discharge instructions provided?: Yes Medications obtained and verified?: Yes (Medications Reviewed) Any new allergies since your discharge?: No Dietary orders reviewed?: Yes Do you have support at home?: No  Home Care and Equipment/Supplies: [place] Ordered?: NA Any new equipment or medical supplies ordered?: NA  Functional Questionnaire: Do you need assistance with bathing/showering or dressing?: No Do you need assistance with meal preparation?: No Do you need assistance with eating?: No Do you have difficulty maintaining continence: No Do you need assistance with getting out of bed/getting out of a chair/moving?: No Do you have difficulty managing or taking your medications?: No  Follow up appointments reviewed: PCP Follow-up appointment confirmed?: Yes Date of PCP follow-up appointment?: 03/20/23 Follow-up Provider: [facility] Follow-up appointment confirmed?: Yes Date of Specialist follow-up appointment?: 04/01/23 Follow-Up Specialty Provider:: Dr Rowan Blase Do you need transportation to your follow-up appointment?: No Do you understand care options if your condition(s) worsen?: Yes-patient verbalized understanding    [place], [place] Direct Dial [phone]

## 2023-03-12 ENCOUNTER — Ambulatory Visit: Payer: Self-pay

## 2023-03-12 NOTE — Chronic Care Management (AMB) (Signed)
   03/12/2023  [place]. 06-Aug-1966 [id number]   Encounter created to change the status from CCM enrolled to previously enrolled. No call made to the patient. Patient recently discharged from the hospital. [facility] call has been completed.   Noreene Larsson RN, MSN, CCM RN Care Manager  Chronic Care Management Direct Number: [phone]

## 2023-03-12 NOTE — Telephone Encounter (Signed)
Error

## 2023-03-20 ENCOUNTER — Inpatient Hospital Stay: Payer: Medicare Other | Admitting: Nurse Practitioner

## 2023-03-20 NOTE — Progress Notes (Deleted)
There were no vitals taken for this visit.   Subjective:    Patient ID: Derek JasmineLewis S Stidham Jr., male    DOB: [birth year], 57 y.o.   MRN: [id number]  HPI: Derek JasmineLewis S Caraher Jr. is a 57 y.o. male  No chief complaint on file.  Transition of Care Hospital Follow up.   Hospital/Facility: [facility]  D/C Physician: Dr. Durenda AgeAndersen D/C Date: 03/10/23  Records Requested: NA Records Received: NA Records Reviewed: Yes  Diagnoses on Discharge:    Acute on chronic diastolic (congestive) heart failure (HCC) Active Problems:   Acute on chronic respiratory failure (HCC)   Acute gastroenteritis   Chronic obstructive pulmonary disease (COPD) (HCC)   Type II diabetes mellitus with renal manifestations (HCC)   Depression with anxiety   Mild cognitive disorder   OSA (obstructive sleep apnea)   ESRD on hemodialysis (HCC)   Polysubstance abuse (HCC)   Hypoxia   SOB (shortness of breath)   Hypervolemia   Date of interactive Contact within 48 hours of discharge:  Contact was through: phone  Date of 7 day or 14 day face-to-face visit:    within 7 days  Outpatient Encounter Medications as of 03/20/2023  Medication Sig   albuterol (VENTOLIN HFA) 108 (90 Base) MCG/ACT inhaler Inhale 1-2 puffs into the lungs every 6 (six) hours as needed for wheezing or shortness of breath.   amLODipine (NORVASC) 10 MG tablet TAKE 1 TABLET(10 MG) BY MOUTH DAILY   aspirin EC 81 MG tablet Take 1 tablet (81 mg total) by mouth daily. Swallow whole.   carvedilol (COREG) 25 MG tablet TAKE 1 TABLET(25 MG) BY MOUTH TWICE DAILY WITH A MEAL   DULoxetine (CYMBALTA) 60 MG capsule Take 1 capsule (60 mg total) by mouth daily.   glucose blood (ONETOUCH ULTRA) test strip USE TO TEST BLOOD SUGAR DAILY   hydrALAZINE (APRESOLINE) 100 MG tablet Take 100 mg by mouth 2 (two) times daily.   lidocaine-prilocaine (EMLA) cream Apply 1 Application topically daily.   losartan (COZAAR) 100 MG tablet Take 100 mg by mouth daily.   multivitamin  (RENA-VIT) TABS tablet Take 1 tablet by mouth daily.   mupirocin cream (BACTROBAN) 2 % Apply 1 Application topically 2 (two) times daily.   nicotine (NICODERM CQ - DOSED IN MG/24 HOURS) 21 mg/24hr patch Place 1 patch (21 mg total) onto the skin daily as needed (nicotine craving).   OXYGEN Inhale 3 L into the lungs daily.   pantoprazole (PROTONIX) 40 MG tablet TAKE 1 TABLET(40 MG) BY MOUTH TWICE DAILY AS NEEDED   topiramate (TOPAMAX) 50 MG tablet TAKE 1 TABLET(50 MG) BY MOUTH TWICE DAILY   torsemide 40 MG TABS Take 40 mg by mouth daily.   No facility-administered encounter medications on file as of 03/20/2023.    Diagnostic Tests Reviewed/Disposition: Reviewed  Consults: Nephrology   Discharge Instructions: Reviewed  Disease/illness Education: Provided during visit  Home Health/Community Services Discussions/Referrals: None  Establishment or re-establishment of referral orders for community resources: Discussed during visit.  Discussion with other health care providers: None  Assessment and Support of treatment regimen adherence: Provided during visit  Appointments Coordinated with: Patient and Caregiver  Education for self-management, independent living, and ADLs: Provided during visit  Relevant past medical, surgical, family and social history reviewed and updated as indicated. Interim medical history since our last visit reviewed. Allergies and medications reviewed and updated.  Review of Systems  Per HPI unless specifically indicated above     Objective:    There were  no vitals taken for this visit.  Wt Readings from Last 3 Encounters:  03/09/23 215 lb 6.2 oz (97.7 kg)  01/07/23 235 lb 14.4 oz (107 kg)  11/24/22 218 lb 4.1 oz (99 kg)    Physical Exam  Results for orders placed or performed during the hospital encounter of 03/08/23  CBC  Result Value Ref Range   WBC 7.7 4.0 - 10.5 K/uL   RBC 4.60 4.22 - 5.81 MIL/uL   Hemoglobin 13.5 13.0 - 17.0 g/dL   HCT 80.3  21.2 - 24.8 %   MCV 87.4 80.0 - 100.0 fL   MCH 29.3 26.0 - 34.0 pg   MCHC 33.6 30.0 - 36.0 g/dL   RDW 25.0 03.7 - 04.8 %   Platelets 315 150 - 400 K/uL   nRBC 0.0 0.0 - 0.2 %  Basic metabolic panel  Result Value Ref Range   Sodium 138 135 - 145 mmol/L   Potassium 3.5 3.5 - 5.1 mmol/L   Chloride 96 (L) 98 - 111 mmol/L   CO2 27 22 - 32 mmol/L   Glucose, Bld 112 (H) 70 - 99 mg/dL   BUN 20 6 - 20 mg/dL   Creatinine, Ser 8.89 (H) 0.61 - 1.24 mg/dL   Calcium 8.5 (L) 8.9 - 10.3 mg/dL   GFR, Estimated 23 (L) >60 mL/min   Anion gap 15 5 - 15  Lactic acid, plasma  Result Value Ref Range   Lactic Acid, Venous 1.0 0.5 - 1.9 mmol/L  Blood gas, venous  Result Value Ref Range   pH, Ven 7.41 7.25 - 7.43   pCO2, Ven 57 44 - 60 mmHg   pO2, Ven 37 32 - 45 mmHg   Bicarbonate 36.1 (H) 20.0 - 28.0 mmol/L   Acid-Base Excess 9.4 (H) 0.0 - 2.0 mmol/L   O2 Saturation 65.7 %   Patient temperature 37.0    Collection site VEIN   Brain natriuretic peptide  Result Value Ref Range   B Natriuretic Peptide 195.0 (H) 0.0 - 100.0 pg/mL  Hepatitis B surface antigen  Result Value Ref Range   Hepatitis B Surface Ag NON REACTIVE NON REACTIVE  Hepatitis B surface antibody,quantitative  Result Value Ref Range   Hep B S AB Quant (Post) <3.5 (L) Immunity>9.9 mIU/mL  Urine Drug Screen, Qualitative ([facility] only)  Result Value Ref Range   Tricyclic, Ur Screen NONE DETECTED NONE DETECTED   Amphetamines, Ur Screen POSITIVE (A) NONE DETECTED   MDMA (Ecstasy)Ur Screen NONE DETECTED NONE DETECTED   Cocaine Metabolite,Ur [place] NONE DETECTED NONE DETECTED   Opiate, Ur Screen NONE DETECTED NONE DETECTED   Phencyclidine (PCP) Ur S NONE DETECTED NONE DETECTED   Cannabinoid 50 Ng, Ur [place] POSITIVE (A) NONE DETECTED   Barbiturates, Ur Screen NONE DETECTED NONE DETECTED   Benzodiazepine, Ur Scrn NONE DETECTED NONE DETECTED   Methadone Scn, Ur NONE DETECTED NONE DETECTED  CBC  Result Value Ref Range   WBC 9.0 4.0 - 10.5 K/uL    RBC 4.29 4.22 - 5.81 MIL/uL   Hemoglobin 12.8 (L) 13.0 - 17.0 g/dL   HCT 16.9 (L) 45.0 - 38.8 %   MCV 90.2 80.0 - 100.0 fL   MCH 29.8 26.0 - 34.0 pg   MCHC 33.1 30.0 - 36.0 g/dL   RDW 82.8 00.3 - 49.1 %   Platelets 297 150 - 400 K/uL   nRBC 0.0 0.0 - 0.2 %  Creatinine, serum  Result Value Ref Range   Creatinine, Ser 3.69 (H)  0.61 - 1.24 mg/dL   GFR, Estimated 18 (L) >60 mL/min  Glucose, capillary  Result Value Ref Range   Glucose-Capillary 141 (H) 70 - 99 mg/dL  Glucose, capillary  Result Value Ref Range   Glucose-Capillary 121 (H) 70 - 99 mg/dL  Glucose, capillary  Result Value Ref Range   Glucose-Capillary 80 70 - 99 mg/dL  Glucose, capillary  Result Value Ref Range   Glucose-Capillary 124 (H) 70 - 99 mg/dL  Renal function panel  Result Value Ref Range   Sodium 138 135 - 145 mmol/L   Potassium 3.7 3.5 - 5.1 mmol/L   Chloride 100 98 - 111 mmol/L   CO2 29 22 - 32 mmol/L   Glucose, Bld 117 (H) 70 - 99 mg/dL   BUN 25 (H) 6 - 20 mg/dL   Creatinine, Ser 8.67 (H) 0.61 - 1.24 mg/dL   Calcium 8.8 (L) 8.9 - 10.3 mg/dL   Phosphorus 4.2 2.5 - 4.6 mg/dL   Albumin 3.4 (L) 3.5 - 5.0 g/dL   GFR, Estimated 13 (L) >60 mL/min   Anion gap 9 5 - 15  CBC  Result Value Ref Range   WBC 8.0 4.0 - 10.5 K/uL   RBC 4.25 4.22 - 5.81 MIL/uL   Hemoglobin 12.5 (L) 13.0 - 17.0 g/dL   HCT 61.9 (L) 50.9 - 32.6 %   MCV 89.4 80.0 - 100.0 fL   MCH 29.4 26.0 - 34.0 pg   MCHC 32.9 30.0 - 36.0 g/dL   RDW 71.2 45.8 - 09.9 %   Platelets 273 150 - 400 K/uL   nRBC 0.0 0.0 - 0.2 %  Glucose, capillary  Result Value Ref Range   Glucose-Capillary 145 (H) 70 - 99 mg/dL  CBG monitoring, ED  Result Value Ref Range   Glucose-Capillary 154 (H) 70 - 99 mg/dL  Troponin I (High Sensitivity)  Result Value Ref Range   Troponin I (High Sensitivity) 50 (H) <18 ng/L      Assessment & Plan:   Problem List Items Addressed This Visit   None    Follow up plan: No follow-ups on file.

## 2023-03-28 ENCOUNTER — Telehealth: Payer: Self-pay | Admitting: Nurse Practitioner

## 2023-03-28 NOTE — Telephone Encounter (Signed)
Copied from CRM #[id number]. Topic: Medical Record Request - Other >> Mar 28, 2023  3:28 PM Victoria B wrote: Reason for CRM: Joy Sumpter from Adult protective services, called in about getting the rest of pts medical records for guardianship. Please call back 

## 2023-03-29 ENCOUNTER — Telehealth: Payer: Self-pay | Admitting: Nurse Practitioner

## 2023-03-29 NOTE — Telephone Encounter (Signed)
[facility] and the information that she has been sent  ins current and no further information is needed.

## 2023-03-29 NOTE — Telephone Encounter (Unsigned)
Copied from CRM [id number]. Topic: Medical Record Request - Other >> Mar 28, 2023  3:28 PM [place] B wrote: Reason for CRM: Nancy Fetter from Adult protective services, called in about getting the rest of pts medical records for guardianship. Please call back

## 2023-04-01 ENCOUNTER — Institutional Professional Consult (permissible substitution): Payer: Medicare Other | Admitting: Student in an Organized Health Care Education/Training Program

## 2023-04-08 ENCOUNTER — Institutional Professional Consult (permissible substitution): Payer: Medicare Other | Admitting: Student in an Organized Health Care Education/Training Program

## 2023-04-22 ENCOUNTER — Telehealth: Payer: Self-pay | Admitting: Nurse Practitioner

## 2023-04-22 NOTE — Telephone Encounter (Signed)
Patient needs an appointment. Please call to schedule. They can bring the from to the appointment.

## 2023-04-22 NOTE — Telephone Encounter (Signed)
Copied from CRM [id number]. Topic: General - Other >> Apr 22, 2023  8:12 AM Carrielelia G wrote: Reason for CRM: Please call Ms Reel in regards to Adult Care [facility] FL2 Form, can she drop them off to have them filled out or does she need to make an appt for pt [place]? Please call her and advise

## 2023-04-22 NOTE — Telephone Encounter (Signed)
Called and scheduled patient for 04/24/2023 @ 4:20 pm.

## 2023-04-24 ENCOUNTER — Other Ambulatory Visit (INDEPENDENT_AMBULATORY_CARE_PROVIDER_SITE_OTHER): Payer: Self-pay | Admitting: Nurse Practitioner

## 2023-04-24 ENCOUNTER — Ambulatory Visit: Payer: Medicare Other | Admitting: Nurse Practitioner

## 2023-04-24 DIAGNOSIS — T829XXS Unspecified complication of cardiac and vascular prosthetic device, implant and graft, sequela: Secondary | ICD-10-CM

## 2023-04-24 DIAGNOSIS — N186 End stage renal disease: Secondary | ICD-10-CM

## 2023-04-24 NOTE — Progress Notes (Deleted)
There were no vitals taken for this visit.   Subjective:    Patient ID: Derek Jasmine., male    DOB: 1966/09/13, 57 y.o.   MRN: [id number]  HPI: Derek Graul. is a 57 y.o. male  No chief complaint on file.  HYPERTENSION / HYPERLIPIDEMIA Satisfied with current treatment? no Duration of hypertension: years BP monitoring frequency: not checking BP range:  BP medication side effects: no Past BP meds: lisinopril Duration of hyperlipidemia: years Cholesterol medication side effects: no Cholesterol supplements: none Past cholesterol medications: none Medication compliance: excellent compliance Aspirin: no Recent stressors: no Recurrent headaches: no Visual changes: no Palpitations: no Dyspnea: no Chest pain: no Lower extremity edema: no Dizzy/lightheaded: no   CHRONIC KIDNEY DISEASE CKD status: controlled Medications renally dose: yes Previous renal evaluation: yes Pneumovax:  Not up to Date Influenza Vaccine:  Not up to Date   Relevant past medical, surgical, family and social history reviewed and updated as indicated. Interim medical history since our last visit reviewed. Allergies and medications reviewed and updated.  Review of Systems  Eyes:  Negative for visual disturbance.  Respiratory:  Negative for chest tightness and shortness of breath.   Cardiovascular:  Negative for chest pain, palpitations and leg swelling.  Endocrine: Negative for polydipsia and polyuria.  Neurological:  Negative for dizziness, light-headedness, numbness and headaches.    Per HPI unless specifically indicated above     Objective:    There were no vitals taken for this visit.  Wt Readings from Last 3 Encounters:  03/09/23 215 lb 6.2 oz (97.7 kg)  01/07/23 235 lb 14.4 oz (107 kg)  11/24/22 218 lb 4.1 oz (99 kg)    Physical Exam Vitals and nursing note reviewed.  Constitutional:      General: He is not in acute distress.    Appearance: Normal appearance. He is not  ill-appearing, toxic-appearing or diaphoretic.  HENT:     Head: Normocephalic.     Right Ear: External ear normal.     Left Ear: External ear normal.     Nose: Nose normal. No congestion or rhinorrhea.     Mouth/Throat:     Mouth: Mucous membranes are moist.  Eyes:     General:        Right eye: No discharge.        Left eye: No discharge.     Extraocular Movements: Extraocular movements intact.     Conjunctiva/sclera: Conjunctivae normal.     Pupils: Pupils are equal, round, and reactive to light.  Cardiovascular:     Rate and Rhythm: Normal rate and regular rhythm.     Heart sounds: No murmur heard. Pulmonary:     Effort: Pulmonary effort is normal. No respiratory distress.     Breath sounds: Normal breath sounds. No wheezing, rhonchi or rales.  Abdominal:     General: Abdomen is flat. Bowel sounds are normal.  Musculoskeletal:     Cervical back: Normal range of motion and neck supple.  Skin:    General: Skin is warm and dry.     Capillary Refill: Capillary refill takes less than 2 seconds.  Neurological:     General: No focal deficit present.     Mental Status: He is alert and oriented to person, place, and time.  Psychiatric:        Mood and Affect: Mood normal.        Behavior: Behavior normal.        Thought Content: Thought  content normal.        Judgment: Judgment normal.     Results for orders placed or performed during the hospital encounter of 03/08/23  CBC  Result Value Ref Range   WBC 7.7 4.0 - 10.5 K/uL   RBC 4.60 4.22 - 5.81 MIL/uL   Hemoglobin 13.5 13.0 - 17.0 g/dL   HCT 29.5 62.1 - 30.8 %   MCV 87.4 80.0 - 100.0 fL   MCH 29.3 26.0 - 34.0 pg   MCHC 33.6 30.0 - 36.0 g/dL   RDW 65.7 84.6 - 96.2 %   Platelets 315 150 - 400 K/uL   nRBC 0.0 0.0 - 0.2 %  Basic metabolic panel  Result Value Ref Range   Sodium 138 135 - 145 mmol/L   Potassium 3.5 3.5 - 5.1 mmol/L   Chloride 96 (L) 98 - 111 mmol/L   CO2 27 22 - 32 mmol/L   Glucose, Bld 112 (H) 70 - 99  mg/dL   BUN 20 6 - 20 mg/dL   Creatinine, Ser 9.52 (H) 0.61 - 1.24 mg/dL   Calcium 8.5 (L) 8.9 - 10.3 mg/dL   GFR, Estimated 23 (L) >60 mL/min   Anion gap 15 5 - 15  Lactic acid, plasma  Result Value Ref Range   Lactic Acid, Venous 1.0 0.5 - 1.9 mmol/L  Blood gas, venous  Result Value Ref Range   pH, Ven 7.41 7.25 - 7.43   pCO2, Ven 57 44 - 60 mmHg   pO2, Ven 37 32 - 45 mmHg   Bicarbonate 36.1 (H) 20.0 - 28.0 mmol/L   Acid-Base Excess 9.4 (H) 0.0 - 2.0 mmol/L   O2 Saturation 65.7 %   Patient temperature 37.0    Collection site VEIN   Brain natriuretic peptide  Result Value Ref Range   B Natriuretic Peptide 195.0 (H) 0.0 - 100.0 pg/mL  Hepatitis B surface antigen  Result Value Ref Range   Hepatitis B Surface Ag NON REACTIVE NON REACTIVE  Hepatitis B surface antibody,quantitative  Result Value Ref Range   Hep B S AB Quant (Post) <3.5 (L) Immunity>9.9 mIU/mL  Urine Drug Screen, Qualitative ([facility] only)  Result Value Ref Range   Tricyclic, Ur Screen NONE DETECTED NONE DETECTED   Amphetamines, Ur Screen POSITIVE (A) NONE DETECTED   MDMA (Ecstasy)Ur Screen NONE DETECTED NONE DETECTED   Cocaine Metabolite,Ur [place] NONE DETECTED NONE DETECTED   Opiate, Ur Screen NONE DETECTED NONE DETECTED   Phencyclidine (PCP) Ur S NONE DETECTED NONE DETECTED   Cannabinoid 50 Ng, Ur [place] POSITIVE (A) NONE DETECTED   Barbiturates, Ur Screen NONE DETECTED NONE DETECTED   Benzodiazepine, Ur Scrn NONE DETECTED NONE DETECTED   Methadone Scn, Ur NONE DETECTED NONE DETECTED  CBC  Result Value Ref Range   WBC 9.0 4.0 - 10.5 K/uL   RBC 4.29 4.22 - 5.81 MIL/uL   Hemoglobin 12.8 (L) 13.0 - 17.0 g/dL   HCT 84.1 (L) 32.4 - 40.1 %   MCV 90.2 80.0 - 100.0 fL   MCH 29.8 26.0 - 34.0 pg   MCHC 33.1 30.0 - 36.0 g/dL   RDW 02.7 25.3 - 66.4 %   Platelets 297 150 - 400 K/uL   nRBC 0.0 0.0 - 0.2 %  Creatinine, serum  Result Value Ref Range   Creatinine, Ser 3.69 (H) 0.61 - 1.24 mg/dL   GFR, Estimated 18 (L) >60  mL/min  Glucose, capillary  Result Value Ref Range   Glucose-Capillary 141 (H) 70 -  99 mg/dL  Glucose, capillary  Result Value Ref Range   Glucose-Capillary 121 (H) 70 - 99 mg/dL  Glucose, capillary  Result Value Ref Range   Glucose-Capillary 80 70 - 99 mg/dL  Glucose, capillary  Result Value Ref Range   Glucose-Capillary 124 (H) 70 - 99 mg/dL  Renal function panel  Result Value Ref Range   Sodium 138 135 - 145 mmol/L   Potassium 3.7 3.5 - 5.1 mmol/L   Chloride 100 98 - 111 mmol/L   CO2 29 22 - 32 mmol/L   Glucose, Bld 117 (H) 70 - 99 mg/dL   BUN 25 (H) 6 - 20 mg/dL   Creatinine, Ser 0.98 (H) 0.61 - 1.24 mg/dL   Calcium 8.8 (L) 8.9 - 10.3 mg/dL   Phosphorus 4.2 2.5 - 4.6 mg/dL   Albumin 3.4 (L) 3.5 - 5.0 g/dL   GFR, Estimated 13 (L) >60 mL/min   Anion gap 9 5 - 15  CBC  Result Value Ref Range   WBC 8.0 4.0 - 10.5 K/uL   RBC 4.25 4.22 - 5.81 MIL/uL   Hemoglobin 12.5 (L) 13.0 - 17.0 g/dL   HCT 11.9 (L) 14.7 - 82.9 %   MCV 89.4 80.0 - 100.0 fL   MCH 29.4 26.0 - 34.0 pg   MCHC 32.9 30.0 - 36.0 g/dL   RDW 56.2 13.0 - 86.5 %   Platelets 273 150 - 400 K/uL   nRBC 0.0 0.0 - 0.2 %  Glucose, capillary  Result Value Ref Range   Glucose-Capillary 145 (H) 70 - 99 mg/dL  CBG monitoring, ED  Result Value Ref Range   Glucose-Capillary 154 (H) 70 - 99 mg/dL  Troponin I (High Sensitivity)  Result Value Ref Range   Troponin I (High Sensitivity) 50 (H) <18 ng/L      Assessment & Plan:   Problem List Items Addressed This Visit   None    Follow up plan: No follow-ups on file.   A total of 30 minutes were spent on this encounter today.  When total time is documented, this includes both the face-to-face and non-face-to-face time personally spent before, during and after the visit on the date of the encounter reviewing CKD and anemia.

## 2023-04-26 ENCOUNTER — Other Ambulatory Visit: Payer: Self-pay

## 2023-04-26 ENCOUNTER — Emergency Department
Admission: EM | Admit: 2023-04-26 | Discharge: 2023-04-26 | Disposition: A | Discharge status: Home / Self Care | Payer: Medicare Other | Attending: Emergency Medicine | Admitting: Emergency Medicine

## 2023-04-26 DIAGNOSIS — I10 Essential (primary) hypertension: Secondary | ICD-10-CM | POA: Insufficient documentation

## 2023-04-26 DIAGNOSIS — Z992 Dependence on renal dialysis: Secondary | ICD-10-CM | POA: Insufficient documentation

## 2023-04-26 LAB — CBC
HCT: 39.5 % (ref 39.0–52.0)
Hemoglobin: 13.3 g/dL (ref 13.0–17.0)
MCH: 30 pg (ref 26.0–34.0)
MCHC: 33.7 g/dL (ref 30.0–36.0)
MCV: 89 fL (ref 80.0–100.0)
Platelets: 243 10*3/uL (ref 150–400)
RBC: 4.44 MIL/uL (ref 4.22–5.81)
RDW: 13.7 % (ref 11.5–15.5)
WBC: 8.9 10*3/uL (ref 4.0–10.5)
nRBC: 0 % (ref 0.0–0.2)

## 2023-04-26 LAB — BASIC METABOLIC PANEL
Anion gap: 14 (ref 5–15)
BUN: 24 mg/dL — ABNORMAL HIGH (ref 6–20)
CO2: 23 mmol/L (ref 22–32)
Calcium: 9 mg/dL (ref 8.9–10.3)
Chloride: 101 mmol/L (ref 98–111)
Creatinine, Ser: 4.55 mg/dL — ABNORMAL HIGH (ref 0.61–1.24)
GFR, Estimated: 14 mL/min — ABNORMAL LOW (ref >60)
Glucose, Bld: 155 mg/dL — ABNORMAL HIGH (ref 70–99)
Potassium: 3.4 mmol/L — ABNORMAL LOW (ref 3.5–5.1)
Sodium: 138 mmol/L (ref 135–145)

## 2023-04-26 LAB — TROPONIN I (HIGH SENSITIVITY)
Troponin I (High Sensitivity): 77 ng/L — ABNORMAL HIGH (ref <18)
Troponin I (High Sensitivity): 72 ng/L — ABNORMAL HIGH (ref <18)

## 2023-04-26 MED ORDER — HYDRALAZINE HCL 20 MG/ML IJ SOLN
10.0000 mg | Freq: Once | INTRAMUSCULAR | Status: AC
  Administered 2023-04-26: 10 mg via INTRAVENOUS
  Filled 2023-04-26: qty 1

## 2023-04-26 NOTE — Discharge Instructions (Signed)
Please seek medical attention for any high fevers, chest pain, shortness of breath, change in behavior, persistent vomiting, bloody stool or any other new or concerning symptoms.  

## 2023-04-26 NOTE — ED Provider Notes (Signed)
[facility] Provider Note    Event Date/Time   First MD Initiated Contact with Patient 04/26/23 1808     (approximate)   History   Hypertension   HPI  Derek Cooley. is a 57 y.o. male  who presents to the emergency department today from dialysis because of concern for elevated blood pressure. He states he has history of blood pressure issues and is on medication. Normally his blood pressure does run high, close to 200. He says that he does start to tell when his blood pressure is running high but felt okay prior to going to dialysis. Denies any chest pain. No recent illness or fevers.        Physical Exam   Triage Vital Signs: ED Triage Vitals  Enc Vitals Group     BP 04/26/23 1452 (!) 218/119     Pulse Rate 04/26/23 1452 94     Resp 04/26/23 1452 (!) 22     Temp 04/26/23 1452 97.6 F (36.4 C)     Temp Source 04/26/23 1452 Oral     SpO2 04/26/23 1452 100 %     Weight 04/26/23 1453 225 lb (102.1 kg)     Height 04/26/23 1453 5\' 7"  (1.702 m)     Head Circumference --      Peak Flow --      Pain Score 04/26/23 1452 8     Pain Loc --      Pain Edu? --      Excl. in GC? --     Most recent vital signs: Vitals:   04/26/23 1452 04/26/23 1830  BP: (!) 218/119 (!) 198/125  Pulse: 94 81  Resp: (!) 22 (!) 22  Temp: 97.6 F (36.4 C) 97.7 F (36.5 C)  SpO2: 100% 100%   General: Awake, alert, oriented. CV:  Good peripheral perfusion. Regular rate and rhythm. Resp:  Normal effort. Lungs clear. Abd:  No distention.    ED Results / Procedures / Treatments   Labs (all labs ordered are listed, but only abnormal results are displayed) Labs Reviewed  BASIC METABOLIC PANEL - Abnormal; Notable for the following components:      Result Value   Potassium 3.4 (*)    Glucose, Bld 155 (*)    BUN 24 (*)    Creatinine, Ser 4.55 (*)    GFR, Estimated 14 (*)    All other components within normal limits  TROPONIN I (HIGH SENSITIVITY) - Abnormal;  Notable for the following components:   Troponin I (High Sensitivity) 77 (*)    All other components within normal limits  TROPONIN I (HIGH SENSITIVITY) - Abnormal; Notable for the following components:   Troponin I (High Sensitivity) 72 (*)    All other components within normal limits  CBC     EKG  I, Phineas Semen, attending physician, personally viewed and interpreted this EKG  EKG Time: 1440 Rate: 94 Rhythm: sinus rhythm with pvc Axis: left axis deviation Intervals: qtc 482 QRS: narrow, LVH ST changes: no st elevation Impression: abnormal ekg  RADIOLOGY None  PROCEDURES:  Critical Care performed: No  MEDICATIONS ORDERED IN ED: Medications - No data to display   IMPRESSION / MDM / ASSESSMENT AND PLAN / ED COURSE  I reviewed the triage vital signs and the nursing notes.  Differential diagnosis includes, but is not limited to, high blood pressure, heart disease  Patient's presentation is most consistent with acute presentation with potential threat to life or bodily function.   The patient is on the cardiac monitor to evaluate for evidence of arrhythmia and/or significant heart rate changes.  Patient presented to the emergency department today because of concerns for elevated blood pressure from dialysis.  Patient states he does have history of significant elevated blood pressure at baseline.  Blood work here did show initial elevated troponin however repeat shows that it is improving.  At this time I have low concern that it signifies ACS given that patient was chest pain-free.  Patient was given medication here in the emergency department and blood pressure did improve.  He states it is better than his baseline.  At this time do think it is reasonable for patient to be discharged.  Did discuss with patient portance of following up with primary care for further blood pressure management.        FINAL CLINICAL IMPRESSION(S) / ED  DIAGNOSES   Final diagnoses:  Hypertension, unspecified type     Note:  This document was prepared using Dragon voice recognition software and may include unintentional dictation errors.    Phineas Semen, MD 04/26/23 2225

## 2023-04-26 NOTE — ED Triage Notes (Signed)
Pt to ED for HTN via EMS. Has been off of normal dose of BP meds since about 1 week. Took a partial dose yesterday. BP 218/119 in triage. Wears 3L oxygen at baseline.  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 Current 1 pack/day smoker.

## 2023-04-26 NOTE — ED Notes (Addendum)
Pt to ED via [facility] from dialysis for high blood pressure. Per EMS Pt got almost his full treatment at dialysis today. He had dialysis yesterday and is scheduled for tomorrow as well because they are trying to pull extra fluid off. Pt took 1 Hydralazine and 2 Clonidine tablets. Pt is in NAD.

## 2023-04-26 NOTE — ED Notes (Signed)
Pt provided box meal and drink.

## 2023-04-29 ENCOUNTER — Ambulatory Visit (INDEPENDENT_AMBULATORY_CARE_PROVIDER_SITE_OTHER): Payer: Medicare Other

## 2023-04-29 DIAGNOSIS — N186 End stage renal disease: Secondary | ICD-10-CM

## 2023-04-29 DIAGNOSIS — T829XXS Unspecified complication of cardiac and vascular prosthetic device, implant and graft, sequela: Secondary | ICD-10-CM | POA: Diagnosis not present

## 2023-05-02 ENCOUNTER — Telehealth (INDEPENDENT_AMBULATORY_CARE_PROVIDER_SITE_OTHER): Payer: Self-pay

## 2023-05-02 NOTE — Telephone Encounter (Signed)
Spoke with the patient's sister and he is scheduled with Dr. Wyn Quaker on 05/20/23 with a 2:00 pm arrival time to the [facility] for a permcath removal. Pre-procedure instructions were discussed and will be sent to Mychart and mailed.

## 2023-05-08 ENCOUNTER — Ambulatory Visit (INDEPENDENT_AMBULATORY_CARE_PROVIDER_SITE_OTHER): Payer: Medicare Other | Admitting: Nurse Practitioner

## 2023-05-13 ENCOUNTER — Encounter: Payer: Self-pay | Admitting: Nurse Practitioner

## 2023-05-20 ENCOUNTER — Ambulatory Visit
Admission: RE | Admit: 2023-05-20 | Discharge: 2023-05-20 | Disposition: A | Discharge status: Home / Self Care | Payer: Medicare Other | Attending: Vascular Surgery | Admitting: Vascular Surgery

## 2023-05-20 ENCOUNTER — Encounter
Admission: RE | Disposition: A | Discharge status: Home / Self Care | Payer: Self-pay | Source: Home / Self Care | Attending: Vascular Surgery

## 2023-05-20 DIAGNOSIS — T8249XA Other complication of vascular dialysis catheter, initial encounter: Secondary | ICD-10-CM | POA: Diagnosis present

## 2023-05-20 DIAGNOSIS — N186 End stage renal disease: Secondary | ICD-10-CM

## 2023-05-20 DIAGNOSIS — I132 Hypertensive heart and chronic kidney disease with heart failure and with stage 5 chronic kidney disease, or end stage renal disease: Secondary | ICD-10-CM | POA: Diagnosis not present

## 2023-05-20 DIAGNOSIS — Z95828 Presence of other vascular implants and grafts: Secondary | ICD-10-CM

## 2023-05-20 DIAGNOSIS — Z992 Dependence on renal dialysis: Secondary | ICD-10-CM | POA: Diagnosis not present

## 2023-05-20 DIAGNOSIS — I2510 Atherosclerotic heart disease of native coronary artery without angina pectoris: Secondary | ICD-10-CM | POA: Insufficient documentation

## 2023-05-20 DIAGNOSIS — E1122 Type 2 diabetes mellitus with diabetic chronic kidney disease: Secondary | ICD-10-CM | POA: Insufficient documentation

## 2023-05-20 DIAGNOSIS — Z452 Encounter for adjustment and management of vascular access device: Secondary | ICD-10-CM | POA: Diagnosis not present

## 2023-05-20 DIAGNOSIS — Y841 Kidney dialysis as the cause of abnormal reaction of the patient, or of later complication, without mention of misadventure at the time of the procedure: Secondary | ICD-10-CM | POA: Insufficient documentation

## 2023-05-20 DIAGNOSIS — I5032 Chronic diastolic (congestive) heart failure: Secondary | ICD-10-CM | POA: Diagnosis not present

## 2023-05-20 DIAGNOSIS — F17210 Nicotine dependence, cigarettes, uncomplicated: Secondary | ICD-10-CM | POA: Insufficient documentation

## 2023-05-20 HISTORY — PX: DIALYSIS/PERMA CATHETER REMOVAL: CATH118289

## 2023-05-20 SURGERY — DIALYSIS/PERMA CATHETER REMOVAL
Anesthesia: LOCAL

## 2023-05-20 MED ORDER — LIDOCAINE-EPINEPHRINE (PF) 1 %-1:200000 IJ SOLN
INTRAMUSCULAR | Status: DC | PRN
  Administered 2023-05-20: 20 mg

## 2023-05-20 SURGICAL SUPPLY — 5 items
APL PRP STRL LF DISP 70% ISPRP (MISCELLANEOUS) ×2
CHLORAPREP W/TINT 26 (MISCELLANEOUS) IMPLANT
FORCEPS HALSTEAD CVD 5IN STRL (INSTRUMENTS) IMPLANT
SCALPEL PROTECTED #11 DISP (BLADE) IMPLANT
TRAY LACERAT/PLASTIC (MISCELLANEOUS) IMPLANT

## 2023-05-20 NOTE — H&P (Signed)
[facility] Admission History & Physical  MRN : [id number]  Derek Cooley. is a 57 y.o. (Dec 18, 1965) male who presents with chief complaint of No chief complaint on file. Marland Kitchen  History of Present Illness: I am asked to evaluate the patient by the dialysis center. The patient was sent here because they have a nonfunctioning tunneled catheter and a functioning radiocephalic AVF.  The patient reports they're not been any problems with any of their dialysis runs. They are reporting good flows with good parameters at dialysis.   Patient denies pain or tenderness overlying the access.  There is no pain with dialysis.  The patient denies hand pain or finger pain consistent with steal syndrome.  No fevers or chills while on dialysis.    No current facility-administered medications for this encounter.    Past Medical History:  Diagnosis Date   Abdominal abscess    a.) chronic MRSA infection   Acute pancreatitis    Anxiety    Asthma    Brain aneurysm    a.) 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 b.) RIGHT crainotomy for clip ligation 05/14/2019.   CHF (congestive heart failure) (HCC)    Chronic back pain    Chronic heart failure with preserved ejection fraction (HFpEF) (HCC)    a. 11/2021 Echo: EF 55-60%, no rwma, mod LVH, GrI DD. Nl RV size/fxn. Mildly dil LA.   CKD (chronic kidney disease), stage V (HCC)    Depression    Emphysema of lung (HCC)    Erectile dysfunction    Followed by palliative care service    GERD (gastroesophageal reflux disease)    History of methicillin resistant staphylococcus aureus (MRSA)    HLD (hyperlipidemia)    Hypertension    Mild cognitive impairment    MRSA (methicillin resistant Staphylococcus aureus)    Obesity    OSA (obstructive sleep apnea) 2013   a.) not compliant with nocturnal PAP therapy; CPAP machine "lost or stolen".   Panic disorder     Perforated bowel (HCC) 12/10/2005   Tempoary Colostomy Bag, Skin Graft for Abd wound   Polysubstance abuse (HCC)    a.) ETOH, tobacco, marijuana, methamphetamines, cocaine, BZO, opioids.   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   T2DM (type 2 diabetes mellitus) (HCC)    Tobacco abuse    Type 2 diabetes mellitus without complication, without long-term current use of insulin (HCC) 04/17/2016   Vitreous hemorrhage (HCC) 03/27/2011   Overview:  Bilateral; 01/2010, [facility]     Past Surgical History:  Procedure Laterality Date   A/V FISTULAGRAM Left 07/16/2022   Procedure: A/V Fistulagram;  Surgeon: Annice Needy, MD;  Location: [facility];  Service: Cardiovascular;  Laterality: Left;   AV FISTULA PLACEMENT Left 05/24/2022   Procedure: ARTERIOVENOUS (AV) FISTULA CREATION ( RADIAL CEPHALIC);  Surgeon: Annice Needy, MD;  Location: [facility] ORS;  Service: Vascular;  Laterality: Left;   CEREBRAL ANEURYSM REPAIR Right 01/26/2010   Procedure: CEREBRAL ANEURYSM REPAIR (COIL EMBOLIZATION)   CEREBRAL ANEURYSM REPAIR Right 05/14/2019   Procedure: CRAINOTOMY FOR CEREBRAL ANEURYSM REPAIR (CLIP LIGATION)   COLON SURGERY  12/10/2005   colostomy bag placed s/p perforated bowel   COLONOSCOPY WITH PROPOFOL N/A 05/18/2020   Procedure: COLONOSCOPY WITH PROPOFOL;  Surgeon: Toney Reil, MD;  Location: [facility] ENDOSCOPY;  Service: Gastroenterology;  Laterality: N/A;   DIALYSIS/PERMA CATHETER INSERTION N/A 06/27/2022   Procedure: DIALYSIS/PERMA CATHETER INSERTION;  Surgeon: Renford Dills, MD;  Location: [facility];  Service: Cardiovascular;  Laterality: N/A;   KNEE SURGERY Right    Perforated bowel       Social History   Tobacco Use   Smoking status: Every Day    Packs/day: 1.00    Years: 40.00    Additional pack years: 0.00    Total pack years: 40.00    Types:  Cigarettes    Start date: 10/11/1984   Smokeless tobacco: Never  Vaping Use   Vaping Use: Never used  Substance Use Topics   Alcohol use: Yes   Drug use: Not Currently    Types: "Crack" cocaine, Amphetamines, Methamphetamines, Benzodiazepines, Cocaine, Marijuana, Other-see comments    Comment: opioids     Family History  Problem Relation Age of Onset   Arthritis Mother    Asthma Mother    Diabetes Mother    Heart disease Mother    Hyperlipidemia Mother    Hypertension Mother    Kidney disease Mother    Thyroid disease Mother    Lung disease Mother    Anxiety disorder Mother    Depression Mother    Diabetes Father    Heart disease Father    Depression Father    Anxiety disorder Father    Arthritis Sister    Asthma Sister    Hyperlipidemia Sister    Hypertension Sister    Lung disease Sister    Anxiety disorder Sister    Depression Sister    Hyperlipidemia Brother    Hypertension Brother    Diabetes Sister    Heart disease Sister    Depression Sister    Anxiety disorder Sister    Anxiety disorder Brother    Depression Brother    Heart disease Brother     No family history of bleeding or clotting disorders, autoimmune disease or porphyria  Allergies  Allergen Reactions   Atorvastatin     Joint Aches - Severe Joint Aches - Severe Joint Aches - Severe   Avelox [Moxifloxacin Hcl In Nacl]     Muscle pain   Buprenorphine     Mouth sores, confusion, shaking   Dilaudid [Hydromorphone Hcl] Hives   Fluoxetine Itching   Levofloxacin Other (See Comments)    Joint Pain   Morphine And Codeine    Other     Muscle pain   Suboxone [Buprenorphine Hcl-Naloxone Hcl] Other (See Comments)    Rash and confused   Vancomycin     Renal insufficiency     REVIEW OF SYSTEMS (Negative unless checked)  Constitutional: [] Weight loss  [] Fever  [] Chills Cardiac: [] Chest pain   [] Chest pressure   [] Palpitations   [] Shortness of breath when laying flat   [] Shortness of breath at  rest   [x] Shortness of breath with exertion. Vascular:  [] Pain in legs with walking   [] Pain in legs at rest   [] Pain in legs when laying flat   [] Claudication   [] Pain in feet when walking  [] Pain in feet at rest  [] Pain in feet when laying flat   [] History of DVT   [] Phlebitis   [] Swelling in legs   [] Varicose veins   [] Non-healing ulcers Pulmonary:   [] Uses home oxygen   [] Productive cough   [] Hemoptysis   [] Wheeze  [] COPD   [] Asthma Neurologic:  [] Dizziness  [] Blackouts   [] Seizures   [x] History of  stroke   [] History of TIA  [] Aphasia   [] Temporary blindness   [] Dysphagia   [] Weakness or numbness in arms   [] Weakness or numbness in legs Musculoskeletal:  [] Arthritis   [] Joint swelling   [] Joint pain   [] Low back pain Hematologic:  [] Easy bruising  [] Easy bleeding   [] Hypercoagulable state   [x] Anemic  [] Hepatitis Gastrointestinal:  [] Blood in stool   [] Vomiting blood  [] Gastroesophageal reflux/heartburn   [] Difficulty swallowing. Genitourinary:  [x] Chronic kidney disease   [] Difficult urination  [] Frequent urination  [] Burning with urination   [] Blood in urine Skin:  [] Rashes   [] Ulcers   [] Wounds Psychological:  [x] History of anxiety   []  History of major depression.  Physical Examination  There were no vitals filed for this visit. There is no height or weight on file to calculate BMI. Gen: WD/WN, NAD Head: [place]/AT, No temporalis wasting.  Ear/Nose/Throat: Hearing grossly intact, nares w/o erythema or drainage, oropharynx w/o Erythema/Exudate,  Eyes: Conjunctiva clear, sclera non-icteric Neck: Trachea midline.  No JVD.  Pulmonary:  Good air movement, respirations not labored, no use of accessory muscles.  Cardiac: RRR, normal S1, S2. Vascular: left radiocephalic AVF with good thrill Vessel Right Left  Radial Palpable Palpable   Musculoskeletal: M/S 5/5 throughout.  Extremities without ischemic changes.  No deformity or atrophy.  Neurologic: Sensation grossly intact in extremities.   Symmetrical.  Speech is fluent. Motor exam as listed above. Psychiatric: Judgment intact, Mood & affect appropriate for pt's clinical situation. Dermatologic: No rashes or ulcers noted.  No cellulitis or open wounds.    CBC Lab Results  Component Value Date   WBC 8.9 04/26/2023   HGB 13.3 04/26/2023   HCT 39.5 04/26/2023   MCV 89.0 04/26/2023   PLT 243 04/26/2023    BMET    Component Value Date/Time   NA 138 04/26/2023 1459   NA 142 01/07/2023 1610   NA 139 08/12/2014 1019   K 3.4 (L) 04/26/2023 1459   K 3.9 08/12/2014 1019   CL 101 04/26/2023 1459   CL 103 08/12/2014 1019   CO2 23 04/26/2023 1459   CO2 27 08/12/2014 1019   GLUCOSE 155 (H) 04/26/2023 1459   GLUCOSE 100 (H) 08/12/2014 1019   BUN 24 (H) 04/26/2023 1459   BUN 50 (H) 01/07/2023 1610   BUN 17 08/12/2014 1019   CREATININE 4.55 (H) 04/26/2023 1459   CREATININE 1.55 (H) 08/12/2014 1019   CALCIUM 9.0 04/26/2023 1459   CALCIUM 9.1 08/12/2014 1019   GFRNONAA 14 (L) 04/26/2023 1459   GFRNONAA 52 (L) 08/12/2014 1019   GFRAA 65 06/29/2020 1033   GFRAA >60 08/12/2014 1019   CrCl cannot be calculated (Patient's most recent lab result is older than the maximum 21 days allowed.).  COAG No results found for: "INR", "PROTIME"  Radiology VAS US DUPLEX DIALYSIS ACCESS (AVF, AVG)  Result Date: 05/02/2023 DIALYSIS ACCESS Patient Name:  Derek Cooley  Date of Exam:   04/29/2023 Medical Rec #: [id number]       Accession #:    [id number] Date of Birth: [birth year]      Patient Gender: M Patient Age:   8 years Exam Location:  [facility] Procedure:      VAS US DUPLEX DIALYSIS ACCESS (AVF, AVG) Referring Phys: Sheppard Plumber --------------------------------------------------------------------------------  Reason for Exam: Whistling in access/difficult cannulation. Access Site: Left Upper Extremity. Access Type: Radial-cephalic AVF. History: 05/24/22: Left radial-cephalic AVF creation;          07/16/22:  Left antecubital  fossa level cephalic vein PTA;. Performing Technologist: Jamse Mead RT, RDMS, RVT  Examination Guidelines: A complete evaluation includes B-mode imaging, spectral Doppler, color Doppler, and power Doppler as needed of all accessible portions of each vessel. Unilateral testing is considered an integral part of a complete examination. Limited examinations for reoccurring indications may be performed as noted.  Findings: +--------------------+----------+-----------------+--------+ AVF                 PSV (cm/s)Flow Vol (mL/min)Comments +--------------------+----------+-----------------+--------+ Native artery inflow   187          1111                +--------------------+----------+-----------------+--------+ AVF Anastomosis        547                              +--------------------+----------+-----------------+--------+  +------------+----------+-------------+----------+------------------------+ OUTFLOW VEINPSV (cm/s)Diameter (cm)Depth (cm)        Describe         +------------+----------+-------------+----------+------------------------+ Dist UA         39                                                    +------------+----------+-------------+----------+------------------------+ AC Fossa       109                             0.54cm perforator v    +------------+----------+-------------+----------+------------------------+ Prox Forearm   111        0.45                                        +------------+----------+-------------+----------+------------------------+ Mid Forearm    100        0.52               0.26cm & 0.19cm branches +------------+----------+-------------+----------+------------------------+ Dist Forearm   485        0.28        0.25        0.15cm branch       +------------+----------+-------------+----------+------------------------+  Retrograde flow noted in the left radial artery distal to the anastomosis. Perforator and basilic veins  appear to be main outflow near antecubital fossa. Edematous tissue noted adjacent to stick sites/lumps.  Summary: Patent left radial-cephalic vein with no hemodynamically significant stenosis or internal vessel narrowing. No significant change when compared to the previous exam on 03/06/23.  *See table(s) above for measurements and observations.  Diagnosing physician: Levora Dredge MD Electronically signed by Levora Dredge MD on 05/02/2023 at 3:08:17 PM.   --------------------------------------------------------------------------------   Final     Assessment/Plan 1.  Complication dialysis device:  Patient's Tunneled catheter is not being used. The patient has an extremity access that is functioning well. Therefore, the patient will undergo removal of the tunneled catheter under local anesthesia.  The risks and benefits were described to the patient.  All questions were answered.  The patient agrees to proceed with angiography and intervention. Potassium will be drawn to ensure that it is an appropriate level prior to performing intervention. 2.  End-stage renal disease requiring hemodialysis:  Patient will continue dialysis therapy without further interruption  3.  Hypertension:  Patient will continue medical management; nephrology is following no changes in oral medications. 4. Diabetes mellitus:  Glucose will be monitored and oral medications been held this morning once the patient has undergone the patient's procedure po intake will be reinitiated and again Accu-Cheks will be used to assess the blood glucose level and treat as needed. The patient will be restarted on the patient's usual hypoglycemic regime 5.  Coronary artery disease:  EKG will be monitored. Nitrates will be used if needed. The patient's oral cardiac medications will be continued.    Festus Barren, MD  05/20/2023 1:52 PM

## 2023-05-20 NOTE — Op Note (Signed)
Operative Note     Preoperative diagnosis:   1. ESRD with functional permanent access  Postoperative diagnosis:  1. ESRD with functional permanent access  Procedure:  Removal of right jugular Permcath  Surgeon:  Festus Barren, MD  Anesthesia:  Local  EBL:  Minimal  Indication for the Procedure:  The patient has a functional permanent dialysis access and no longer needs their permcath.  This can be removed.  Risks and benefits are discussed and informed consent is obtained.  Description of the Procedure:  The patient's right neck, chest and existing catheter were sterilely prepped and draped. The area around the catheter was anesthetized copiously with 1% lidocaine. The catheter was dissected out with curved hemostats until the cuff was freed from the surrounding fibrous sheath. The fiber sheath was transected, and the catheter was then removed in its entirety using gentle traction. Pressure was held and sterile dressings were placed. The patient tolerated the procedure well and was taken to the recovery room in stable condition.     Festus Barren  05/20/2023, 3:18 PM This note was created with Dragon Medical transcription system. Any errors in dictation are purely unintentional.

## 2023-05-20 NOTE — Discharge Instructions (Addendum)
Tunneled Catheter Removal, Care After Refer to this sheet in the next few weeks. These instructions provide you with information about caring for yourself after your procedure. Your health care provider may also give you more specific instructions. Your treatment has been planned according to current medical practices, but problems sometimes occur. Call your health care provider if you have any problems or questions after your procedure. What can I expect after the procedure? After the procedure, it is common to have: Some mild redness, swelling, and pain around your catheter site.   Follow these instructions at home: Incision care  Check your removal site  every day for signs of infection. Check for: More redness, swelling, or pain. More fluid or blood. Warmth. Pus or a bad smell. Remove your dressing in 48hrs leave open to air  Activity  Return to your normal activities as told by your health care provider. Ask your health care provider what activities are safe for you. Do not lift anything that is heavier than 10 lb (4.5 kg) for 3 days  You may shower tomorrow  Contact a health care provider if: You have more fluid or blood coming from your removal site You have more redness, swelling, or pain at your incisions or around the area where your catheter was removed Your removal site feel warm to the touch. You feel unusually weak. You feel nauseous.. Get help right away if You have swelling in your arm, shoulder, neck, or face. You develop chest pain. You have difficulty breathing. You feel dizzy or light-headed. You have pus or a bad smell coming from your removal site You have a fever. You develop bleeding from your removal site, and your bleeding does not stop. This information is not intended to replace advice given to you by your health care provider. Make sure you discuss any questions you have with your health care provider. Document Released: 11/12/2012 Document Revised:  07/29/2016 Document Reviewed: 08/22/2015

## 2023-05-21 ENCOUNTER — Encounter: Payer: Self-pay | Admitting: Vascular Surgery

## 2023-06-07 ENCOUNTER — Ambulatory Visit: Payer: Medicare Other | Admitting: Physician Assistant

## 2023-06-10 ENCOUNTER — Ambulatory Visit (INDEPENDENT_AMBULATORY_CARE_PROVIDER_SITE_OTHER): Payer: Medicare Other | Admitting: Physician Assistant

## 2023-06-10 ENCOUNTER — Encounter: Payer: Self-pay | Admitting: Physician Assistant

## 2023-06-10 VITALS — BP 179/75 | HR 91 | Temp 98.5°F | Wt 209.2 lb

## 2023-06-10 DIAGNOSIS — Z992 Dependence on renal dialysis: Secondary | ICD-10-CM

## 2023-06-10 DIAGNOSIS — I152 Hypertension secondary to endocrine disorders: Secondary | ICD-10-CM | POA: Diagnosis not present

## 2023-06-10 DIAGNOSIS — N186 End stage renal disease: Secondary | ICD-10-CM

## 2023-06-10 DIAGNOSIS — J441 Chronic obstructive pulmonary disease with (acute) exacerbation: Secondary | ICD-10-CM

## 2023-06-10 DIAGNOSIS — E1122 Type 2 diabetes mellitus with diabetic chronic kidney disease: Secondary | ICD-10-CM | POA: Diagnosis not present

## 2023-06-10 DIAGNOSIS — F418 Other specified anxiety disorders: Secondary | ICD-10-CM

## 2023-06-10 DIAGNOSIS — E1159 Type 2 diabetes mellitus with other circulatory complications: Secondary | ICD-10-CM | POA: Diagnosis not present

## 2023-06-10 DIAGNOSIS — E7800 Pure hypercholesterolemia, unspecified: Secondary | ICD-10-CM

## 2023-06-10 DIAGNOSIS — F419 Anxiety disorder, unspecified: Secondary | ICD-10-CM

## 2023-06-10 DIAGNOSIS — E781 Pure hyperglyceridemia: Secondary | ICD-10-CM

## 2023-06-10 MED ORDER — TRELEGY ELLIPTA 100-62.5-25 MCG/ACT IN AEPB: 1.0000 | INHALATION_SPRAY | Freq: Every day | RESPIRATORY_TRACT | 1 refills | Status: DC

## 2023-06-10 MED ORDER — AMLODIPINE BESYLATE 10 MG PO TABS: 10.0000 mg | ORAL_TABLET | Freq: Every day | ORAL | 0 refills | Status: DC

## 2023-06-10 MED ORDER — LOSARTAN POTASSIUM 100 MG PO TABS: 100.0000 mg | ORAL_TABLET | Freq: Every day | ORAL | 0 refills | Status: DC

## 2023-06-10 MED ORDER — ALBUTEROL SULFATE HFA 108 (90 BASE) MCG/ACT IN AERS
1.0000 | INHALATION_SPRAY | Freq: Four times a day (QID) | RESPIRATORY_TRACT | 1 refills | Status: DC | PRN
Start: 2023-06-10 — End: 2023-09-18

## 2023-06-10 NOTE — Progress Notes (Signed)
Established Patient office visit   Patient: Derek Cooley.   DOB: [birth year]   57 y.o. Male  MRN: [id number] Visit Date: 06/10/2023  Today's healthcare provider: Oswaldo Conroy Cerenity Goshorn, PA-C  Introduced myself to the patient as a Secondary school teacher and provided education on APPs in clinical practice.    Chief Complaint  Patient presents with   Congestive Heart Failure   Diabetes   Depression   Hypertension   Anxiety   Subjective    HPI   He is here with his sister   HYPERTENSION / HYPERLIPIDEMIA Satisfied with current treatment?  Unsure when he was last consistently taking medications  Duration of hypertension: years BP monitoring frequency: not checking BP range:  BP medication side effects: no Past BP meds: amlodipine and losartan (cozaar) Duration of hyperlipidemia: years Cholesterol medication side effects: no Cholesterol supplements: none Past cholesterol medications: none Medication compliance: poor compliance Aspirin: no Recent stressors: no Recurrent headaches: yes  Visual changes: yes- states he has current blurry vision  Palpitations: no Dyspnea: yes, every once in while when it gets hot  Chest pain: no Lower extremity edema: on and off  Dizzy/lightheaded: no   Diabetes, Type 2 - Last A1c 6.3 in Jan 2024  - Medications: not taking anything currently  - Compliance: poor  - Checking BG at home: not checking at home  - Eye exam: Overdue- discussed importance of annual screening for retinopathy  - Foot exam: Completed today  - Microalbumin: UTD  - Statin: intolerant of statins  - PNA vaccine: NA - Denies symptoms of hypoglycemia, polyuria, polydipsia, numbness extremities, foot ulcers/trauma  DEPRESSION Mood status: Taking Cymbalta? Satisfied with current treatment?:  NA  Symptom severity: moderate  Duration of current treatment :  Reports limited improvement in symptoms on current regimen  Side effects:  NA Medication compliance: poor  compliance Psychotherapy/counseling: no    Previous psychiatric medications: cymbalta Depressed mood: yes Anxious mood: yes Anhedonia: yes Significant weight loss or gain:  unsure  Insomnia: no    Fatigue: yes Feelings of worthlessness or guilt: no Impaired concentration/indecisiveness: yes Suicidal ideations: no Hopelessness: yes Crying spells: no    06/10/2023    1:54 PM 01/07/2023    3:42 PM 07/17/2022    2:00 PM 06/11/2022   12:51 PM 02/02/2022    3:01 PM  Depression screen PHQ 2/9  Decreased Interest 2 2 2 1 3   Down, Depressed, Hopeless 2 2 1 1 3   PHQ - 2 Score 4 4 3 2 6   Altered sleeping 3 3 2 3 3   Tired, decreased energy 2 3 3 3 3   Change in appetite 3 1 1 2 3   Feeling bad or failure about yourself  2 2 0 2 3  Trouble concentrating 3 3 2 2 3   Moving slowly or fidgety/restless 1 2 1  0 3  Suicidal thoughts 0 0 0 0 0  PHQ-9 Score 18 18 12 14 24   Difficult doing work/chores Somewhat difficult Somewhat difficult Not difficult at all  Very difficult       06/10/2023    1:55 PM 01/07/2023    3:42 PM 08/28/2022    4:04 PM 07/17/2022    2:01 PM  GAD 7 : Generalized Anxiety Score  Nervous, Anxious, on Edge 3 2 0 3  Control/stop worrying 2 2 0 1  Worry too much - different things 2 2 0 1  Trouble relaxing 2 2 0 3  Restless 3 2  0 2  Easily annoyed or irritable 3 2 0 3  Afraid - awful might happen 2 2 0 2  Total GAD 7 Score 17 14 0 15  Anxiety Difficulty Somewhat difficult Somewhat difficult Not difficult at all Not difficult at all        Medications: Outpatient Medications Prior to Visit  Medication Sig   glucose blood (ONETOUCH ULTRA) test strip USE TO TEST BLOOD SUGAR DAILY   lidocaine-prilocaine (EMLA) cream Apply 1 Application topically daily. (Patient not taking: Reported on 06/11/2023)   OXYGEN Inhale 3 L into the lungs daily.   pantoprazole (PROTONIX) 40 MG tablet TAKE 1 TABLET(40 MG) BY MOUTH TWICE DAILY AS NEEDED   topiramate (TOPAMAX) 50 MG tablet TAKE 1  TABLET(50 MG) BY MOUTH TWICE DAILY   [DISCONTINUED] albuterol (VENTOLIN HFA) 108 (90 Base) MCG/ACT inhaler Inhale 1-2 puffs into the lungs every 6 (six) hours as needed for wheezing or shortness of breath.   [DISCONTINUED] amLODipine (NORVASC) 10 MG tablet TAKE 1 TABLET(10 MG) BY MOUTH DAILY   [DISCONTINUED] carvedilol (COREG) 25 MG tablet TAKE 1 TABLET(25 MG) BY MOUTH TWICE DAILY WITH A MEAL   [DISCONTINUED] hydrALAZINE (APRESOLINE) 100 MG tablet Take 100 mg by mouth 2 (two) times daily.   [DISCONTINUED] losartan (COZAAR) 100 MG tablet Take 100 mg by mouth daily.   [DISCONTINUED] multivitamin (RENA-VIT) TABS tablet Take 1 tablet by mouth daily.   [DISCONTINUED] TRELEGY ELLIPTA 100-62.5-25 MCG/ACT AEPB Inhale 1 puff into the lungs daily.   DULoxetine (CYMBALTA) 60 MG capsule Take 1 capsule (60 mg total) by mouth daily.   [DISCONTINUED] aspirin EC 81 MG tablet Take 1 tablet (81 mg total) by mouth daily. Swallow whole. (Patient not taking: Reported on 06/10/2023)   [DISCONTINUED] mupirocin cream (BACTROBAN) 2 % Apply 1 Application topically 2 (two) times daily.   [DISCONTINUED] nicotine (NICODERM CQ - DOSED IN MG/24 HOURS) 21 mg/24hr patch Place 1 patch (21 mg total) onto the skin daily as needed (nicotine craving).   [DISCONTINUED] torsemide 40 MG TABS Take 40 mg by mouth daily.   No facility-administered medications prior to visit.    Review of Systems  Eyes:  Positive for visual disturbance.  Cardiovascular:  Negative for chest pain, palpitations and leg swelling.  Neurological:  Positive for headaches.  Psychiatric/Behavioral:  Positive for dysphoric mood. The patient is nervous/anxious.        Objective    BP (!) 179/75 (BP Location: Right Arm, Cuff Size: Normal)   Pulse 91   Temp 98.5 F (36.9 C) (Oral)   Wt 209 lb 3.2 oz (94.9 kg)   SpO2 97%   BMI 32.77 kg/m    Physical Exam Vitals reviewed.  Constitutional:      General: He is awake.     Appearance: Normal appearance. He  is well-developed and well-groomed.  HENT:     Head: Normocephalic and atraumatic.  Cardiovascular:     Rate and Rhythm: Normal rate and regular rhythm.     Pulses: Normal pulses.          Radial pulses are 2+ on the right side and 2+ on the left side.       Dorsalis pedis pulses are 2+ on the right side and 2+ on the left side.       Posterior tibial pulses are 2+ on the right side and 2+ on the left side.     Heart sounds: Normal heart sounds.  Pulmonary:     Effort: Pulmonary effort is  normal.     Breath sounds: Normal breath sounds. No decreased air movement. No decreased breath sounds, wheezing, rhonchi or rales.  Musculoskeletal:     Cervical back: Normal range of motion.     Right lower leg: No edema.     Left lower leg: No edema.  Neurological:     Mental Status: He is alert.  Psychiatric:        Behavior: Behavior is cooperative.       Results for orders placed or performed in visit on 06/10/23  HgB A1c  Result Value Ref Range   Hgb A1c MFr Bld 5.5 4.8 - 5.6 %   Est. average glucose Bld gHb Est-mCnc 111 mg/dL  CBC w/Diff  Result Value Ref Range   WBC 10.6 3.4 - 10.8 x10E3/uL   RBC 3.61 (L) 4.14 - 5.80 x10E6/uL   Hemoglobin 11.2 (L) 13.0 - 17.7 g/dL   Hematocrit 16.1 (L) 09.6 - 51.0 %   MCV 93 79 - 97 fL   MCH 31.0 26.6 - 33.0 pg   MCHC 33.2 31.5 - 35.7 g/dL   RDW 04.5 (H) 40.9 - 81.1 %   Platelets 297 150 - 450 x10E3/uL   Neutrophils 73 Not Estab. %   Lymphs 16 Not Estab. %   Monocytes 6 Not Estab. %   Eos 3 Not Estab. %   Basos 1 Not Estab. %   Neutrophils Absolute 7.7 (H) 1.4 - 7.0 x10E3/uL   Lymphocytes Absolute 1.7 0.7 - 3.1 x10E3/uL   Monocytes Absolute 0.7 0.1 - 0.9 x10E3/uL   EOS (ABSOLUTE) 0.3 0.0 - 0.4 x10E3/uL   Basophils Absolute 0.1 0.0 - 0.2 x10E3/uL   Immature Granulocytes 1 Not Estab. %   Immature Grans (Abs) 0.1 0.0 - 0.1 x10E3/uL  Comp Met (CMET)  Result Value Ref Range   Glucose 106 (H) 70 - 99 mg/dL   BUN 37 (H) 6 - 24 mg/dL    Creatinine, Ser 9.14 (H) 0.76 - 1.27 mg/dL   eGFR 9 (L) >78 GN/FAO/1.30   BUN/Creatinine Ratio 6 (L) 9 - 20   Sodium 139 134 - 144 mmol/L   Potassium 3.7 3.5 - 5.2 mmol/L   Chloride 98 96 - 106 mmol/L   CO2 21 20 - 29 mmol/L   Calcium 8.4 (L) 8.7 - 10.2 mg/dL   Total Protein 6.7 6.0 - 8.5 g/dL   Albumin 4.3 3.8 - 4.9 g/dL   Globulin, Total 2.4 1.5 - 4.5 g/dL   Bilirubin Total 0.6 0.0 - 1.2 mg/dL   Alkaline Phosphatase 132 (H) 44 - 121 IU/L   AST 17 0 - 40 IU/L   ALT 10 0 - 44 IU/L  Lipid Profile  Result Value Ref Range   Cholesterol, Total 209 (H) 100 - 199 mg/dL   Triglycerides 865 (H) 0 - 149 mg/dL   HDL 38 (L) >78 mg/dL   VLDL Cholesterol Cal 54 (H) 5 - 40 mg/dL   LDL Chol Calc (NIH) 469 (H) 0 - 99 mg/dL   Chol/HDL Ratio 5.5 (H) 0.0 - 5.0 ratio    Assessment & Plan      Return in about 3 months (around 09/10/2023) for HTN, Depression, Diabetes follow up, ESRD.      Problem List Items Addressed This Visit       Cardiovascular and Mediastinum   Hypertension associated with diabetes (HCC) - Primary    Chronic, historic condition, ongoing BP is elevated in office today- unsure if he is taking medications  regularly- will send in refills today to assist with consistent administration  Appears as though patient is only on Losartan 100 mg PO every day and Amlodipine 10 mg po every day - others appear to have been previously discontinued  Recommend follow up in 3 months for monitoring - may need adjustments if consistency has improved and BP is still elevated      Relevant Medications   amLODipine (NORVASC) 10 MG tablet   losartan (COZAAR) 100 MG tablet   Other Relevant Orders   CBC w/Diff (Completed)     Respiratory   Chronic obstructive pulmonary disease (COPD) (HCC)    Chronic, historic condition Unsure if he is using trelegy regularly - refills provided today for both maintenance and rescue inhalers Recommend consistent maintenance inhaler use for optimal  management Follow up in 3 months for monitoring or sooner if concerns arise       Relevant Medications   albuterol (VENTOLIN HFA) 108 (90 Base) MCG/ACT inhaler   TRELEGY ELLIPTA 100-62.5-25 MCG/ACT AEPB     Endocrine   Type II diabetes mellitus with renal manifestations (HCC)    Chronic, historic condition Previous A1c was 6.3 - not currently managed with medications Repeat labs today- results to dictate further management Foot exam completed today Reviewed importance of annual eye exams for retinopathy screening Follow up in 3 months for monitoring or sooner if concerns arise       Relevant Medications   losartan (COZAAR) 100 MG tablet   Other Relevant Orders   HgB A1c (Completed)     Genitourinary   ESRD (end stage renal disease) on dialysis (HCC)    Chronic, historic condition Patient is on dialysis and followed by nephrology and vascular surgery for management  Recheck labs today  Recommend consistent dialysis treatments for management  Recommend consistent follow up with speciality for optimal management  Follow up in 3 months for monitoring       Relevant Orders   Comp Met (CMET) (Completed)     Other   High blood cholesterol    Chronic, historic condition Unsure how well managed this is given his low medication compliance  Recheck lipid panel today- results to dictate further management  He has documented allergy to statins so may need to consider a different option  Refills to be sent in after labs are back Follow up in 3 months or sooner if concerns arise.       Relevant Medications   amLODipine (NORVASC) 10 MG tablet   losartan (COZAAR) 100 MG tablet   Other Relevant Orders   Lipid Profile (Completed)   Chronic anxiety   Hypertriglyceridemia   Relevant Medications   amLODipine (NORVASC) 10 MG tablet   losartan (COZAAR) 100 MG tablet   Other Relevant Orders   Lipid Profile (Completed)   Depression with anxiety    Chronic, historic condition Unsure  if he is taking medications right now - Cymbalta is managed by another provider- unsure if this is recommended to continue due to ESRD  His GAD7 and PHQ9 are elevated  Patient would likely benefit from medication management as allowed with his kidney function  Recommend follow up in a few weeks to discuss management given his symptoms         Return in about 3 months (around 09/10/2023) for HTN, Depression, Diabetes follow up, ESRD.   I, Harold Mattes E Shatori Bertucci, PA-C, have reviewed all documentation for this visit. The documentation on 06/11/23 for the exam, diagnosis, procedures, and orders are  all accurate and complete.   Jacquelin Hawking, MHS, PA-C [facility]

## 2023-06-11 ENCOUNTER — Emergency Department: Payer: Medicare Other

## 2023-06-11 ENCOUNTER — Emergency Department
Admission: EM | Admit: 2023-06-11 | Discharge: 2023-06-11 | Disposition: A | Discharge status: Home / Self Care | Payer: Medicare Other | Attending: Emergency Medicine | Admitting: Emergency Medicine

## 2023-06-11 ENCOUNTER — Other Ambulatory Visit: Payer: Self-pay

## 2023-06-11 DIAGNOSIS — T50905A Adverse effect of unspecified drugs, medicaments and biological substances, initial encounter: Secondary | ICD-10-CM

## 2023-06-11 DIAGNOSIS — I132 Hypertensive heart and chronic kidney disease with heart failure and with stage 5 chronic kidney disease, or end stage renal disease: Secondary | ICD-10-CM | POA: Diagnosis not present

## 2023-06-11 DIAGNOSIS — R519 Headache, unspecified: Secondary | ICD-10-CM | POA: Diagnosis not present

## 2023-06-11 DIAGNOSIS — F17210 Nicotine dependence, cigarettes, uncomplicated: Secondary | ICD-10-CM | POA: Insufficient documentation

## 2023-06-11 DIAGNOSIS — I509 Heart failure, unspecified: Secondary | ICD-10-CM | POA: Insufficient documentation

## 2023-06-11 DIAGNOSIS — N186 End stage renal disease: Secondary | ICD-10-CM | POA: Diagnosis not present

## 2023-06-11 DIAGNOSIS — T465X5A Adverse effect of other antihypertensive drugs, initial encounter: Secondary | ICD-10-CM | POA: Insufficient documentation

## 2023-06-11 DIAGNOSIS — R42 Dizziness and giddiness: Secondary | ICD-10-CM | POA: Diagnosis present

## 2023-06-11 DIAGNOSIS — J449 Chronic obstructive pulmonary disease, unspecified: Secondary | ICD-10-CM | POA: Insufficient documentation

## 2023-06-11 DIAGNOSIS — Z992 Dependence on renal dialysis: Secondary | ICD-10-CM | POA: Diagnosis not present

## 2023-06-11 LAB — COMPREHENSIVE METABOLIC PANEL
ALT: 10 IU/L (ref 0–44)
ALT: 9 U/L (ref 0–44)
AST: 17 IU/L (ref 0–40)
AST: 17 U/L (ref 15–41)
Albumin: 4.3 g/dL (ref 3.8–4.9)
Albumin: 3.5 g/dL (ref 3.5–5.0)
Alkaline Phosphatase: 132 IU/L — ABNORMAL HIGH (ref 44–121)
Alkaline Phosphatase: 89 U/L (ref 38–126)
Anion gap: 14 (ref 5–15)
BUN/Creatinine Ratio: 6 — ABNORMAL LOW (ref 9–20)
BUN: 37 mg/dL — ABNORMAL HIGH (ref 6–24)
BUN: 35 mg/dL — ABNORMAL HIGH (ref 6–20)
Bilirubin Total: 0.6 mg/dL (ref 0.0–1.2)
CO2: 21 mmol/L (ref 20–29)
CO2: 19 mmol/L — ABNORMAL LOW (ref 22–32)
Calcium: 8.4 mg/dL — ABNORMAL LOW (ref 8.7–10.2)
Calcium: 7.8 mg/dL — ABNORMAL LOW (ref 8.9–10.3)
Chloride: 98 mmol/L (ref 96–106)
Chloride: 104 mmol/L (ref 98–111)
Creatinine, Ser: 6.63 mg/dL — ABNORMAL HIGH (ref 0.76–1.27)
Creatinine, Ser: 5.92 mg/dL — ABNORMAL HIGH (ref 0.61–1.24)
GFR, Estimated: 10 mL/min — ABNORMAL LOW (ref >60)
Globulin, Total: 2.4 g/dL (ref 1.5–4.5)
Glucose, Bld: 142 mg/dL — ABNORMAL HIGH (ref 70–99)
Glucose: 106 mg/dL — ABNORMAL HIGH (ref 70–99)
Potassium: 3.7 mmol/L (ref 3.5–5.2)
Potassium: 3.4 mmol/L — ABNORMAL LOW (ref 3.5–5.1)
Sodium: 139 mmol/L (ref 134–144)
Sodium: 137 mmol/L (ref 135–145)
Total Bilirubin: 0.9 mg/dL (ref 0.3–1.2)
Total Protein: 6.7 g/dL (ref 6.0–8.5)
Total Protein: 6 g/dL — ABNORMAL LOW (ref 6.5–8.1)
eGFR: 9 mL/min/{1.73_m2} — ABNORMAL LOW (ref >59)

## 2023-06-11 LAB — TROPONIN I (HIGH SENSITIVITY)
Troponin I (High Sensitivity): 71 ng/L — ABNORMAL HIGH (ref <18)
Troponin I (High Sensitivity): 64 ng/L — ABNORMAL HIGH (ref <18)

## 2023-06-11 LAB — CBC WITH DIFFERENTIAL/PLATELET
Abs Immature Granulocytes: 0.06 10*3/uL (ref 0.00–0.07)
Basophils Absolute: 0.1 10*3/uL (ref 0.0–0.2)
Basophils Absolute: 0.1 10*3/uL (ref 0.0–0.1)
Basophils Relative: 1 %
Basos: 1 %
EOS (ABSOLUTE): 0.3 10*3/uL (ref 0.0–0.4)
Eos: 3 %
Eosinophils Absolute: 0.2 10*3/uL (ref 0.0–0.5)
Eosinophils Relative: 4 %
HCT: 30.6 % — ABNORMAL LOW (ref 39.0–52.0)
Hematocrit: 33.7 % — ABNORMAL LOW (ref 37.5–51.0)
Hemoglobin: 11.2 g/dL — ABNORMAL LOW (ref 13.0–17.7)
Hemoglobin: 10.1 g/dL — ABNORMAL LOW (ref 13.0–17.0)
Immature Grans (Abs): 0.1 10*3/uL (ref 0.0–0.1)
Immature Granulocytes: 1 %
Immature Granulocytes: 1 %
Lymphocytes Absolute: 1.7 10*3/uL (ref 0.7–3.1)
Lymphocytes Relative: 19 %
Lymphs Abs: 1.2 10*3/uL (ref 0.7–4.0)
Lymphs: 16 %
MCH: 31 pg (ref 26.6–33.0)
MCH: 31.2 pg (ref 26.0–34.0)
MCHC: 33.2 g/dL (ref 31.5–35.7)
MCHC: 33 g/dL (ref 30.0–36.0)
MCV: 93 fL (ref 79–97)
MCV: 94.4 fL (ref 80.0–100.0)
Monocytes Absolute: 0.7 10*3/uL (ref 0.1–0.9)
Monocytes Absolute: 0.5 10*3/uL (ref 0.1–1.0)
Monocytes Relative: 7 %
Monocytes: 6 %
Neutro Abs: 4.3 10*3/uL (ref 1.7–7.7)
Neutrophils Absolute: 7.7 10*3/uL — ABNORMAL HIGH (ref 1.4–7.0)
Neutrophils Relative %: 68 %
Neutrophils: 73 %
Platelets: 297 10*3/uL (ref 150–450)
Platelets: 238 10*3/uL (ref 150–400)
RBC: 3.61 x10E6/uL — ABNORMAL LOW (ref 4.14–5.80)
RBC: 3.24 MIL/uL — ABNORMAL LOW (ref 4.22–5.81)
RDW: 16.2 % — ABNORMAL HIGH (ref 11.6–15.4)
RDW: 16.8 % — ABNORMAL HIGH (ref 11.5–15.5)
WBC: 10.6 10*3/uL (ref 3.4–10.8)
WBC: 6.3 10*3/uL (ref 4.0–10.5)
nRBC: 0 % (ref 0.0–0.2)

## 2023-06-11 LAB — URINALYSIS, W/ REFLEX TO CULTURE (INFECTION SUSPECTED)
Bilirubin Urine: NEGATIVE
Glucose, UA: NEGATIVE mg/dL
Hgb urine dipstick: NEGATIVE
Ketones, ur: NEGATIVE mg/dL
Leukocytes,Ua: NEGATIVE
Nitrite: NEGATIVE
Protein, ur: >=300 mg/dL — AB
Specific Gravity, Urine: 1.013 (ref 1.005–1.030)
pH: 5 (ref 5.0–8.0)

## 2023-06-11 LAB — LACTIC ACID, PLASMA
Lactic Acid, Venous: 2.2 mmol/L (ref 0.5–1.9)
Lactic Acid, Venous: 1.2 mmol/L (ref 0.5–1.9)

## 2023-06-11 LAB — HEMOGLOBIN A1C
Est. average glucose Bld gHb Est-mCnc: 111 mg/dL
Hgb A1c MFr Bld: 5.5 % (ref 4.8–5.6)

## 2023-06-11 LAB — LIPID PANEL
Chol/HDL Ratio: 5.5 ratio — ABNORMAL HIGH (ref 0.0–5.0)
Cholesterol, Total: 209 mg/dL — ABNORMAL HIGH (ref 100–199)
HDL: 38 mg/dL — ABNORMAL LOW (ref >39)
LDL Chol Calc (NIH): 117 mg/dL — ABNORMAL HIGH (ref 0–99)
Triglycerides: 307 mg/dL — ABNORMAL HIGH (ref 0–149)
VLDL Cholesterol Cal: 54 mg/dL — ABNORMAL HIGH (ref 5–40)

## 2023-06-11 LAB — BRAIN NATRIURETIC PEPTIDE: B Natriuretic Peptide: 270.1 pg/mL — ABNORMAL HIGH (ref 0.0–100.0)

## 2023-06-11 MED ORDER — ACETAMINOPHEN 500 MG PO TABS
1000.0000 mg | ORAL_TABLET | Freq: Once | ORAL | Status: AC
  Administered 2023-06-11: 1000 mg via ORAL

## 2023-06-11 NOTE — Assessment & Plan Note (Signed)
Chronic, historic condition Patient is on dialysis and followed by nephrology and vascular surgery for management  Recheck labs today  Recommend consistent dialysis treatments for management  Recommend consistent follow up with speciality for optimal management  Follow up in 3 months for monitoring

## 2023-06-11 NOTE — ED Triage Notes (Addendum)
Mid dialysis (30 mins in), Pt was feeling lethargic and dizzy and when standing he had a near syncope episode and became pale. Pts BP dropped to 110/60 when his normal is SBP 180s. Pt took all 5 of his BP medications he was prescribed when he typically takes only 2- lorsartan and hydralazine. Pt was given of NS by dialysis. BGL: 186 324mg  of aspirin given by ems. Dialysis T, TH, Sat. GCS 15. Pt is on baseline 3L [place] but has not been using since his O2 has been out at home.

## 2023-06-11 NOTE — Assessment & Plan Note (Signed)
Chronic, historic condition Unsure if he is taking medications right now - Cymbalta is managed by another provider- unsure if this is recommended to continue due to ESRD  His GAD7 and PHQ9 are elevated  Patient would likely benefit from medication management as allowed with his kidney function  Recommend follow up in a few weeks to discuss management given his symptoms

## 2023-06-11 NOTE — Assessment & Plan Note (Signed)
Chronic, historic condition Previous A1c was 6.3 - not currently managed with medications Repeat labs today- results to dictate further management Foot exam completed today Reviewed importance of annual eye exams for retinopathy screening Follow up in 3 months for monitoring or sooner if concerns arise

## 2023-06-11 NOTE — ED Provider Notes (Signed)
[facility] Provider Note    Event Date/Time   First MD Initiated Contact with Patient 06/11/23 1253     (approximate)   History   Dizziness and Fatigue   HPI  Derek Eurich. is a 57 y.o. male   Past medical history of COPD on 3 L chronically, ongoing smoker, hemodialysis patient for end-stage renal disease with sessions on Tuesday, Thursday, Saturday, hypertension, OSA, depression anxiety, CHF, mild cognitive impairment, polysubstance use, who presents to the emergency department with low blood pressure from dialysis.  He had an appointment yesterday with his primary doctor and was encouraged to take all of his antihypertensive medications for high blood pressure because he has been picking and choosing which ones to take.  Yesterday he felt well had no acute medical complaints and then this morning as he was getting ready for dialysis he decided to take all of his antihypertensives as prescribed by his doctor for the first time.  When he got to his dialysis session and they hooked him up, he felt lightheaded, dizzy, nauseous.    Medic from EMS noted that when he stood up he got very lightheaded and fell backwards.  No significant trauma noted.  The patient states that he has some chronic left shoulder pain that appears to be worse now from an old injury.  Had a mild cough.  He denies shortness of breath or chest pain.  He does denies abdominal pain, nausea vomiting or diarrhea.  He still makes urine but denies dysuria or frequency.  -- Upon reassessment by nursing now the patient stating abdominal pain but notes that this is chronic and unchanged for many years.  When asked about headache he says yes but "only for 1 minute".  He has a history of cognitive impairment, I wonder if this may contribute to his inconsistent reports. --   Independent Historian contributed to assessment above: EMS reports as above  External Medical Documents Reviewed: Office  visit yesterday from primary doctor [facility] addressing his multiple medical comorbidities, including hypertension.  Discharge summary from 03/10/2023 for shortness of breath notes discharge medications including amlodipine 10 mg, carvedilol 25 mg, hydralazine 100 mg, losartan 100 mg, and torsemide 40 mg      Physical Exam   Triage Vital Signs: ED Triage Vitals [06/11/23 1242]  Enc Vitals Group     BP 120/70     Pulse Rate 65     Resp 20     Temp 98.1 F (36.7 C)     Temp src      SpO2 99 %     Weight      Height      Head Circumference      Peak Flow      Pain Score      Pain Loc      Pain Edu?      Excl. in GC?     Most recent vital signs: Vitals:   06/11/23 1242 06/11/23 1315  BP: 120/70 (!) 105/56  Pulse: 65 (!) 58  Resp: 20 (!) 23  Temp: 98.1 F (36.7 C)   SpO2: 99% 100%    General: Awake, no distress.  CV:  Good peripheral perfusion.  Resp:  Normal effort.  Abd:  No distention.  Other:  Awake alert answering all questions appropriately.  He will close eyes but open them when asking questions, he appears to be uncomfortable. lungs clear without obvious focalities or  wheezing or rales.  Soft nontender abdomen.  He is afebrile with a blood pressure 120/70 no respiratory distress.   ED Results / Procedures / Treatments   Labs (all labs ordered are listed, but only abnormal results are displayed) Labs Reviewed  LACTIC ACID, PLASMA - Abnormal; Notable for the following components:      Result Value   Lactic Acid, Venous 2.2 (*)    All other components within normal limits  COMPREHENSIVE METABOLIC PANEL - Abnormal; Notable for the following components:   Potassium 3.4 (*)    CO2 19 (*)    Glucose, Bld 142 (*)    BUN 35 (*)    Creatinine, Ser 5.92 (*)    Calcium 7.8 (*)    Total Protein 6.0 (*)    GFR, Estimated 10 (*)    All other components within normal limits  TROPONIN I (HIGH SENSITIVITY) - Abnormal; Notable  for the following components:   Troponin I (High Sensitivity) 71 (*)    All other components within normal limits  CULTURE, BLOOD (ROUTINE X 2)  CULTURE, BLOOD (ROUTINE X 2)  LACTIC ACID, PLASMA  CBC WITH DIFFERENTIAL/PLATELET  URINALYSIS, W/ REFLEX TO CULTURE (INFECTION SUSPECTED)  BRAIN NATRIURETIC PEPTIDE  CBC WITH DIFFERENTIAL/PLATELET  TROPONIN I (HIGH SENSITIVITY)     I ordered and reviewed the above labs they are notable for initial troponin is 71, consistent with baseline, and a lactic acidosis of 2.2.  Creatinine is 5.9  EKG  ED ECG REPORT I, Pilar Jarvis, the attending physician, personally viewed and interpreted this ECG.   Date: 06/11/2023  EKG Time: 1241  Rate: 67  Rhythm: sinus  Axis: nl  Intervals:nl  ST&T Change: Inferolateral T wave inversions, no STEMI    RADIOLOGY I independently reviewed and interpreted CT of the head see no obvious bleeding or midline shift   PROCEDURES:  Critical Care performed: No  Procedures   MEDICATIONS ORDERED IN ED: Medications  acetaminophen (TYLENOL) tablet 1,000 mg (has no administration in time range)    IMPRESSION / MDM / ASSESSMENT AND PLAN / ED COURSE  I reviewed the triage vital signs and the nursing notes.                                Patient's presentation is most consistent with acute presentation with potential threat to life or bodily function.  Differential diagnosis includes, but is not limited to, adverse effect of medication, infection, sepsis, intracranial bleeding, intra-abdominal infection or perforation, tract infection, respiratory infection, ACS   The patient is on the cardiac monitor to evaluate for evidence of arrhythmia and/or significant heart rate changes.  MDM:   The patient's normalized blood pressure compared to markedly hypertensive upon previous visits in the setting of taking all of his antihypertensives (normally only takes 2) with nondescript symptoms of lightheadedness,  nausea is most likely due to adverse effect of medication whereby he dropped his blood pressure too quickly.  However he has significant comorbidities and cognitive impairment and will report to me some degree of chest pain, headache, abdominal pain as well.  It is uncertain what the timeline or chronicity of these issues are.  Will obtain infectious workup including basic labs, lactic, urinalysis, chest x-ray.  Will check his chest pain with EKG and serial troponins.  Will get a CT head and abdomen and pelvis to assess for these headaches/abdominal pains given his documented history of intracranial bleeding and  perforated bowel.   -- Head and abdomen CT unremarkable, chest x-ray with evidence of pulmonary venous congestion consistent with ESRD due for dialysis today.  No respiratory distress, no hypoxemia I do not see an indication for emergent dialysis at this very moment.  Blood pressure remains low, continue to monitor. Infectious workup pending, urinalysis pending, at the time of signout.  Disposition pending remaining workup as above, blood pressure monitoring & reassessment.       FINAL CLINICAL IMPRESSION(S) / ED DIAGNOSES   Final diagnoses:  Adverse effect of drug, initial encounter  Orthostatic lightheadedness  Nonintractable headache, unspecified chronicity pattern, unspecified headache type     Rx / DC Orders   ED Discharge Orders     None        Note:  This document was prepared using Dragon voice recognition software and may include unintentional dictation errors.    Pilar Jarvis, MD 06/11/23 [phone]

## 2023-06-11 NOTE — Assessment & Plan Note (Addendum)
Chronic, historic condition Unsure how well managed this is given his low medication compliance  Recheck lipid panel today- results to dictate further management  He has documented allergy to statins so may need to consider a different option  Refills to be sent in after labs are back Follow up in 3 months or sooner if concerns arise.

## 2023-06-11 NOTE — Assessment & Plan Note (Addendum)
Chronic, historic condition, ongoing BP is elevated in office today- unsure if he is taking medications regularly- will send in refills today to assist with consistent administration  Appears as though patient is only on Losartan 100 mg PO every day and Amlodipine 10 mg po every day - others appear to have been previously discontinued  Recommend follow up in 3 months for monitoring - may need adjustments if consistency has improved and BP is still elevated

## 2023-06-11 NOTE — Progress Notes (Signed)
Your labs have returned  Your blood count shows anemia - sometimes this can be secondary to kidney disease and dialysis but if you are having concerns for bleeding, weakness, fatigue please let us know Your electrolytes are overall normal at this time. Your kidney function is low and appears a bit worse from your last labs.  Your Calcium is low - I recommend increasing your Calcium intake in your diet to assist with this.  Your cholesterol is high. I see that you have an allergy to statins but there are other medications we can potentially try to manage this. Let me know if you would like me to send this in or if you want to discuss it first.  Your A1c was great at 5.5 - no medications indicated at this time

## 2023-06-11 NOTE — Assessment & Plan Note (Signed)
Chronic, historic condition Unsure if he is using trelegy regularly - refills provided today for both maintenance and rescue inhalers Recommend consistent maintenance inhaler use for optimal management Follow up in 3 months for monitoring or sooner if concerns arise

## 2023-06-11 NOTE — ED Provider Notes (Addendum)
Care assumed of patient from outgoing provider.  See their note for initial history, exam and plan.  Clinical Course as of 06/11/23 1620  Tue Jun 11, 2023  1608 ESRD TThSa, still makes urine, yesterday poorly controlled HTN on 5 different medications and history of noncompliance.  Today at HD dizzy at HD, BP 120 --> 100s.  Initial trop elevated but at baseline. CT head, AP, wnl.  No obvious source of infectious process.  [SM]    Clinical Course User Index [SM] Corena Herter, MD   On reevaluation patient hypertensive.  Orthostatic blood pressures are negative.  States that he is feeling better and just hungry.  Discussed restarting his blood pressure medications, calling his primary care physician to discuss what blood pressure medications to restart given that he had a long history of noncompliance.  Tolerating p.o. in the emergency department.  Standing without any significant dizziness.  Discharged home in stable condition. Corena Herter, MD 06/11/23 1620    Corena Herter, MD 06/11/23 1726

## 2023-06-11 NOTE — Assessment & Plan Note (Deleted)
Chronic, historic condition Unsure if he is taking medications right now - Cymbalta is managed by another provider- unsure if this is recommended to continue due to ESRD  His GAD7 and PHQ9 are elevated  Patient would likely benefit from medication management as allowed with his kidney function  Recommend follow up in a few weeks to discuss management given his symptoms  

## 2023-06-11 NOTE — Discharge Instructions (Signed)
Your blood pressure was low after dialysis and taking your blood pressure medications as prescribed.  Given that you have a history of noncompliance with your blood pressure medication call your primary care physician to discuss what blood pressure medications you should continue on.  Return to the emergency department if you have return of your symptoms.

## 2023-06-13 LAB — CULTURE, BLOOD (ROUTINE X 2)

## 2023-06-14 LAB — CULTURE, BLOOD (ROUTINE X 2): Culture: NO GROWTH

## 2023-06-17 ENCOUNTER — Telehealth: Payer: Self-pay

## 2023-06-17 NOTE — Transitions of Care (Post Inpatient/ED Visit) (Signed)
   06/17/2023  Name: Derek Cooley. MRN: [id number] DOB: 1966-08-29  Today's TOC FU Call Status: Today's TOC FU Call Status:: Successful TOC FU Call Competed TOC FU Call Complete Date: 06/17/23  Transition Care Management Follow-up Telephone Call Date of Discharge: 06/11/23 Discharge Facility: [facility]) Type of Discharge: Emergency Department Reason for ED Visit: Other: How have you been since you were released from the hospital?: Better Any questions or concerns?: No  Items Reviewed: Did you receive and understand the discharge instructions provided?: Yes Medications obtained,verified, and reconciled?: Yes (Medications Reviewed) Any new allergies since your discharge?: No Dietary orders reviewed?: No Do you have support at home?: No  Medications Reviewed Today: Medications Reviewed Today     Reviewed by Pablo Ledger, CMA (Certified Medical Assistant) on 06/17/23 at 1022  Med List Status: <None>   Medication Order Taking? Sig Documenting Provider Last Dose Status Informant  albuterol (VENTOLIN HFA) 108 (90 Base) MCG/ACT inhaler [id number] Yes Inhale 1-2 puffs into the lungs every 6 (six) hours as needed for wheezing or shortness of breath. Mecum, Oswaldo Conroy, PA-C Taking Active Pharmacy Records, Multiple Informants, Self  amLODipine (NORVASC) 10 MG tablet [id number] Yes Take 1 tablet (10 mg total) by mouth daily. Mecum, Oswaldo Conroy, PA-C Taking Active Pharmacy Records, Multiple Informants, Self  DULoxetine (CYMBALTA) 60 MG capsule [id number]  Take 1 capsule (60 mg total) by mouth daily. Joycelyn Das, MD  Expired 06/11/23 2359 Pharmacy Records, Multiple Informants, Self  glucose blood (ONETOUCH ULTRA) test strip [id number] Yes USE TO TEST BLOOD SUGAR DAILY Valentino Nose, NP Taking Active Pharmacy Records, Multiple Informants, Self  lidocaine-prilocaine (EMLA) cream [id number] Yes Apply 1 Application topically daily. [provider] Taking  Active Pharmacy Records, Multiple Informants, Self  losartan (COZAAR) 100 MG tablet [id number] Yes Take 1 tablet (100 mg total) by mouth daily. Mecum, Oswaldo Conroy, PA-C Taking Active Pharmacy Records, Multiple Informants, Self  OXYGEN [id number] Yes Inhale 3 L into the lungs daily. [provider] Taking Active Pharmacy Records, Multiple Informants, Self  pantoprazole (PROTONIX) 40 MG tablet [id number] Yes TAKE 1 TABLET(40 MG) BY MOUTH TWICE DAILY AS NEEDED Larae Grooms, NP Taking Active Pharmacy Records, Multiple Informants, Self  topiramate (TOPAMAX) 50 MG tablet [id number] Yes TAKE 1 TABLET(50 MG) BY MOUTH TWICE DAILY Larae Grooms, NP Taking Active Pharmacy Records, Multiple Informants, Self  TRELEGY ELLIPTA 100-62.5-25 MCG/ACT AEPB [id number] Yes Inhale 1 puff into the lungs daily. Mecum, Oswaldo Conroy, PA-C Taking Active Pharmacy Records, Multiple Informants, Self            Home Care and Equipment/Supplies: Were Home Health Services Ordered?: No Any new equipment or medical supplies ordered?: No  Functional Questionnaire: Do you need assistance with bathing/showering or dressing?: No Do you need assistance with meal preparation?: No Do you need assistance with eating?: No Do you have difficulty maintaining continence: No Do you need assistance with getting out of bed/getting out of a chair/moving?: No Do you have difficulty managing or taking your medications?: No  Follow up appointments reviewed: PCP Follow-up appointment confirmed?: Yes Date of PCP follow-up appointment?: 06/19/23 Follow-up Provider: Aura Dials, [facility] Follow-up appointment confirmed?: No Do you need transportation to your follow-up appointment?: No Do you understand care options if your condition(s) worsen?: Yes-patient verbalized understanding    SIGNATURE: Wilhemena Durie, CMA

## 2023-06-19 ENCOUNTER — Ambulatory Visit: Payer: Medicare Other | Admitting: Nurse Practitioner

## 2023-06-19 ENCOUNTER — Encounter: Payer: Self-pay | Admitting: Nurse Practitioner

## 2023-06-19 VITALS — BP 180/106 | HR 81 | Temp 98.0°F | Ht 67.09 in | Wt 208.6 lb

## 2023-06-19 DIAGNOSIS — I5032 Chronic diastolic (congestive) heart failure: Secondary | ICD-10-CM

## 2023-06-19 DIAGNOSIS — I152 Hypertension secondary to endocrine disorders: Secondary | ICD-10-CM | POA: Diagnosis not present

## 2023-06-19 DIAGNOSIS — E1159 Type 2 diabetes mellitus with other circulatory complications: Secondary | ICD-10-CM | POA: Diagnosis not present

## 2023-06-19 MED ORDER — TORSEMIDE 5 MG PO TABS: 5.0000 mg | ORAL_TABLET | Freq: Every day | ORAL | 5 refills | Status: DC

## 2023-06-19 MED ORDER — LOSARTAN POTASSIUM 25 MG PO TABS
25.0000 mg | ORAL_TABLET | Freq: Every day | ORAL | 4 refills | Status: DC
Start: 2023-06-19 — End: 2023-09-28

## 2023-06-19 NOTE — Assessment & Plan Note (Addendum)
Chronic, ongoing with recent labs showing trend up in BNP and fluid on CXR in ER -- he does have coarse lung sounds to lower bases and mild edema.  Has not been taking Torsemide.  Continue Hydralazine and Carvedilol as ordered -- will add back on Losartan at 25 MG dosing for kidneys and Torsemide 5 MG due to elevation in BNP and fluid noted in lungs at ER visit,  coarse sounds today noted and mild edema -- instructed him to hold Torsemide on dialysis days. Has not seen HF clinic since September 2023 -- highly recommend he schedule a visit with them. - Reminded to call for an overnight weight gain of >2 pounds or a weekly weight gain of >5 pounds - not adding salt to food and read food labels. Reviewed the importance of keeping daily sodium intake to 2000mg  daily.  - Avoid Ibuprofen

## 2023-06-19 NOTE — Progress Notes (Addendum)
BP (!) 180/106 (BP Location: Right Arm, Patient Position: Sitting, Cuff Size: Normal)   Pulse 81   Temp 98 F (36.7 C) (Oral)   Ht 5' 7.09" (1.704 m)   Wt 208 lb 9.6 oz (94.6 kg)   SpO2 98%   BMI 32.59 kg/m    Subjective:    Patient ID: Derek Jasmine., male    DOB: Apr 03, 1966, 57 y.o.   MRN: [id number]  HPI: Derek Lia. is a 57 y.o. male  Chief Complaint  Patient presents with   ER Follow-up    Hypotensive episode at Dialysis   ER FOLLOW UP Seen in ER on 06/11/23 due to orthostatic hypotensive episode at dialysis that day -- never received treatment.  On 06/10/23 he had office visit and it was reported he had not been taking medication as ordered.  After visit he started to take Losartan 100 MG and Amlodipine 10 MG as ordered.  Prior he was only taking Hydralazine and Carvedilol.    Goes to dialysis Tuesday, Thursday, Saturday. Currently is only taking Hydralazine and Carvedilol.  Has not taken medication this morning.  Since ER visit his blood pressure has been stable with no low levels.  Continues to smoke, about 1 PPD -- has reduced this.  Last saw HF Clinic 08/28/22 and cardiology 07/19/22.  Had been on Toresemide in past, not currently taking he reports.  BNP elevated in ER and fluid noted on CXR.  He is not checking BP at home. Time since discharge: 8 days ago Hospital/facility: [facility] Diagnosis: orthostatic hypotension Procedures/tests: Labs performed, baseline anemia present Consultants: none New medications: none Discharge instructions:  Follow-up with PCP Status: stable   HYPERTENSION with Chronic Kidney Disease Hypertension status:  uncontrolled this morning, but has not taken medication yet   Satisfied with current treatment? yes Duration of hypertension: chronic BP monitoring frequency:  not checking BP range:  BP medication side effects:  no Medication compliance: poor compliance Aspirin: no Recurrent headaches: no Visual changes: no Palpitations:  no Dyspnea: no Chest pain: no Lower extremity edema: yes Dizzy/lightheaded: no   Relevant past medical, surgical, family and social history reviewed and updated as indicated. Interim medical history since our last visit reviewed. Allergies and medications reviewed and updated.  Review of Systems  Constitutional:  Negative for activity change, diaphoresis, fatigue and fever.  Respiratory:  Negative for cough, chest tightness, shortness of breath and wheezing.   Cardiovascular:  Positive for leg swelling. Negative for chest pain and palpitations.  Gastrointestinal: Negative.   Musculoskeletal: Negative.   Skin: Negative.   Neurological: Negative.   Psychiatric/Behavioral: Negative.     Per HPI unless specifically indicated above     Objective:    BP (!) 180/106 (BP Location: Right Arm, Patient Position: Sitting, Cuff Size: Normal)   Pulse 81   Temp 98 F (36.7 C) (Oral)   Ht 5' 7.09" (1.704 m)   Wt 208 lb 9.6 oz (94.6 kg)   SpO2 98%   BMI 32.59 kg/m   Wt Readings from Last 3 Encounters:  06/19/23 208 lb 9.6 oz (94.6 kg)  06/10/23 209 lb 3.2 oz (94.9 kg)  05/20/23 220 lb 7.4 oz (100 kg)    Physical Exam Vitals and nursing note reviewed.  Constitutional:      General: He is awake. He is not in acute distress.    Appearance: He is well-developed and well-groomed. He is obese. He is not ill-appearing or toxic-appearing.  HENT:  Head: Normocephalic.     Right Ear: Hearing and external ear normal.     Left Ear: Hearing and external ear normal.  Eyes:     General: Lids are normal.     Extraocular Movements: Extraocular movements intact.     Conjunctiva/sclera: Conjunctivae normal.  Neck:     Thyroid: No thyromegaly.     Vascular: No carotid bruit.  Cardiovascular:     Rate and Rhythm: Normal rate and regular rhythm.     Heart sounds: Normal heart sounds.  Pulmonary:     Effort: Pulmonary effort is normal. No accessory muscle usage or respiratory distress.      Breath sounds: Examination of the right-lower field reveals rhonchi. Examination of the left-lower field reveals rhonchi. Rhonchi present. No decreased breath sounds, wheezing or rales.  Abdominal:     General: Bowel sounds are normal. There is no distension.     Palpations: Abdomen is soft.     Tenderness: There is no abdominal tenderness.  Musculoskeletal:     Cervical back: Full passive range of motion without pain.     Right lower leg: Edema (trace) present.     Left lower leg: Edema (trace) present.  Lymphadenopathy:     Cervical: No cervical adenopathy.  Skin:    General: Skin is warm.     Capillary Refill: Capillary refill takes less than 2 seconds.  Neurological:     Mental Status: He is alert and oriented to person, place, and time.     Cranial Nerves: Cranial nerves 2-12 are intact.     Deep Tendon Reflexes: Reflexes are normal and symmetric.     Reflex Scores:      Brachioradialis reflexes are 2+ on the right side and 2+ on the left side.      Patellar reflexes are 2+ on the right side and 2+ on the left side. Psychiatric:        Attention and Perception: Attention normal.        Mood and Affect: Mood normal.        Speech: Speech normal.        Behavior: Behavior normal. Behavior is cooperative.        Thought Content: Thought content normal.     Results for orders placed or performed during the hospital encounter of 06/11/23  Culture, blood (routine x 2)   Specimen: BLOOD  Result Value Ref Range   Specimen Description BLOOD LEFT ANTECUBITAL    Special Requests      BOTTLES DRAWN AEROBIC AND ANAEROBIC Blood Culture results may not be optimal due to an inadequate volume of blood received in culture bottles   Culture      NO GROWTH 5 DAYS Performed at [facility], [address] [place], [place] [address]    Report Status 06/16/2023 FINAL   Lactic acid, plasma  Result Value Ref Range   Lactic Acid, Venous 2.2 (HH) 0.5 - 1.9 mmol/L  Lactic acid,  plasma  Result Value Ref Range   Lactic Acid, Venous 1.2 0.5 - 1.9 mmol/L  Comprehensive metabolic panel  Result Value Ref Range   Sodium 137 135 - 145 mmol/L   Potassium 3.4 (L) 3.5 - 5.1 mmol/L   Chloride 104 98 - 111 mmol/L   CO2 19 (L) 22 - 32 mmol/L   Glucose, Bld 142 (H) 70 - 99 mg/dL   BUN 35 (H) 6 - 20 mg/dL   Creatinine, Ser 6.04 (H) 0.61 - 1.24 mg/dL  Calcium 7.8 (L) 8.9 - 10.3 mg/dL   Total Protein 6.0 (L) 6.5 - 8.1 g/dL   Albumin 3.5 3.5 - 5.0 g/dL   AST 17 15 - 41 U/L   ALT 9 0 - 44 U/L   Alkaline Phosphatase 89 38 - 126 U/L   Total Bilirubin 0.9 0.3 - 1.2 mg/dL   GFR, Estimated 10 (L) >60 mL/min   Anion gap 14 5 - 15  Urinalysis, w/ Reflex to Culture (Infection Suspected) -Urine, Clean Catch  Result Value Ref Range   Specimen Source URINE, CLEAN CATCH    Color, Urine YELLOW (A) YELLOW   APPearance HAZY (A) CLEAR   Specific Gravity, Urine 1.013 1.005 - 1.030   pH 5.0 5.0 - 8.0   Glucose, UA NEGATIVE NEGATIVE mg/dL   Hgb urine dipstick NEGATIVE NEGATIVE   Bilirubin Urine NEGATIVE NEGATIVE   Ketones, ur NEGATIVE NEGATIVE mg/dL   Protein, ur >=161 (A) NEGATIVE mg/dL   Nitrite NEGATIVE NEGATIVE   Leukocytes,Ua NEGATIVE NEGATIVE   WBC, UA 0-5 0 - 5 WBC/hpf   Bacteria, UA RARE (A) NONE SEEN   Squamous Epithelial / HPF 6-10 0 - 5 /HPF   Mucus PRESENT    Hyaline Casts, UA PRESENT   Brain natriuretic peptide  Result Value Ref Range   B Natriuretic Peptide 270.1 (H) 0.0 - 100.0 pg/mL  CBC with Differential/Platelet  Result Value Ref Range   WBC 6.3 4.0 - 10.5 K/uL   RBC 3.24 (L) 4.22 - 5.81 MIL/uL   Hemoglobin 10.1 (L) 13.0 - 17.0 g/dL   HCT 09.6 (L) 04.5 - 40.9 %   MCV 94.4 80.0 - 100.0 fL   MCH 31.2 26.0 - 34.0 pg   MCHC 33.0 30.0 - 36.0 g/dL   RDW 81.1 (H) 91.4 - 78.2 %   Platelets 238 150 - 400 K/uL   nRBC 0.0 0.0 - 0.2 %   Neutrophils Relative % 68 %   Neutro Abs 4.3 1.7 - 7.7 K/uL   Lymphocytes Relative 19 %   Lymphs Abs 1.2 0.7 - 4.0 K/uL    Monocytes Relative 7 %   Monocytes Absolute 0.5 0.1 - 1.0 K/uL   Eosinophils Relative 4 %   Eosinophils Absolute 0.2 0.0 - 0.5 K/uL   Basophils Relative 1 %   Basophils Absolute 0.1 0.0 - 0.1 K/uL   Immature Granulocytes 1 %   Abs Immature Granulocytes 0.06 0.00 - 0.07 K/uL  Troponin I (High Sensitivity)  Result Value Ref Range   Troponin I (High Sensitivity) 71 (H) <18 ng/L  Troponin I (High Sensitivity)  Result Value Ref Range   Troponin I (High Sensitivity) 64 (H) <18 ng/L      Assessment & Plan:   Problem List Items Addressed This Visit       Cardiovascular and Mediastinum   Chronic heart failure with preserved ejection fraction (HFpEF) (HCC)    Chronic, ongoing with recent labs showing trend up in BNP and fluid on CXR in ER -- he does have coarse lung sounds to lower bases and mild edema.  Has not been taking Torsemide.  Continue Hydralazine and Carvedilol as ordered -- will add back on Losartan at 25 MG dosing for kidneys and Torsemide 5 MG due to elevation in BNP and fluid noted in lungs at ER visit,  coarse sounds today noted and mild edema -- instructed him to hold Torsemide on dialysis days. Has not seen HF clinic since September 2023 -- highly recommend he schedule a  visit with them. - Reminded to call for an overnight weight gain of >2 pounds or a weekly weight gain of >5 pounds - not adding salt to food and read food labels. Reviewed the importance of keeping daily sodium intake to 2000mg  daily.  - Avoid Ibuprofen       Relevant Medications   carvedilol (COREG) 25 MG tablet   hydrALAZINE (APRESOLINE) 100 MG tablet   losartan (COZAAR) 25 MG tablet   torsemide (DEMADEX) 5 MG tablet   Hypertension associated with diabetes (HCC) - Primary    Chronic, ongoing with BP elevated today - has not taken medication this morning (is out in car).  Recent episode of hypotension after taking his medication as ordered -- prior he had only been taking Hydralazine and Carvedilol.  Has  not seen cardiology since August 2023, highly recommend he schedule follow-up with them.  Recommend he monitor BP at least a few mornings a week at home and document.  DASH diet at home.  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  Labs up to date.  Return in 1 week.       Relevant Medications   carvedilol (COREG) 25 MG tablet   hydrALAZINE (APRESOLINE) 100 MG tablet   losartan (COZAAR) 25 MG tablet   torsemide (DEMADEX) 5 MG tablet     Follow up plan: Return in about 1 week (around 06/26/2023) for HTN with CKD.

## 2023-06-19 NOTE — Patient Instructions (Signed)
Hypertension, Adult High blood pressure (hypertension) is when the force of blood pumping through the arteries is too strong. The arteries are the blood vessels that carry blood from the heart throughout the body. Hypertension forces the heart to work harder to pump blood and may cause arteries to become narrow or stiff. Untreated or uncontrolled hypertension can lead to a heart attack, heart failure, a stroke, kidney disease, and other problems. A blood pressure reading consists of a higher number over a lower number. Ideally, your blood pressure should be below 120/80. The first ("top") number is called the systolic pressure. It is a measure of the pressure in your arteries as your heart beats. The second ("bottom") number is called the diastolic pressure. It is a measure of the pressure in your arteries as the heart relaxes. What are the causes? The exact cause of this condition is not known. There are some conditions that result in high blood pressure. What increases the risk? Certain factors may make you more likely to develop high blood pressure. Some of these risk factors are under your control, including: Smoking. Not getting enough exercise or physical activity. Being overweight. Having too much fat, sugar, calories, or salt (sodium) in your diet. Drinking too much alcohol. Other risk factors include: Having a personal history of heart disease, diabetes, high cholesterol, or kidney disease. Stress. Having a family history of high blood pressure and high cholesterol. Having obstructive sleep apnea. Age. The risk increases with age. What are the signs or symptoms? High blood pressure may not cause symptoms. Very high blood pressure (hypertensive crisis) may cause: Headache. Fast or irregular heartbeats (palpitations). Shortness of breath. Nosebleed. Nausea and vomiting. Vision changes. Severe chest pain, dizziness, and seizures. How is this diagnosed? This condition is diagnosed by  measuring your blood pressure while you are seated, with your arm resting on a flat surface, your legs uncrossed, and your feet flat on the floor. The cuff of the blood pressure monitor will be placed directly against the skin of your upper arm at the level of your heart. Blood pressure should be measured at least twice using the same arm. Certain conditions can cause a difference in blood pressure between your right and left arms. If you have a high blood pressure reading during one visit or you have normal blood pressure with other risk factors, you may be asked to: Return on a different day to have your blood pressure checked again. Monitor your blood pressure at home for 1 week or longer. If you are diagnosed with hypertension, you may have other blood or imaging tests to help your health care provider understand your overall risk for other conditions. How is this treated? This condition is treated by making healthy lifestyle changes, such as eating healthy foods, exercising more, and reducing your alcohol intake. You may be referred for counseling on a healthy diet and physical activity. Your health care provider may prescribe medicine if lifestyle changes are not enough to get your blood pressure under control and if: Your systolic blood pressure is above 130. Your diastolic blood pressure is above 80. Your personal target blood pressure may vary depending on your medical conditions, your age, and other factors. Follow these instructions at home: Eating and drinking  Eat a diet that is high in fiber and potassium, and low in sodium, added sugar, and fat. An example of this eating plan is called the DASH diet. DASH stands for Dietary Approaches to Stop Hypertension. To eat this way: Eat   plenty of fresh fruits and vegetables. Try to fill one half of your plate at each meal with fruits and vegetables. Eat whole grains, such as whole-wheat pasta, brown rice, or whole-grain bread. Fill about one  fourth of your plate with whole grains. Eat or drink low-fat dairy products, such as skim milk or low-fat yogurt. Avoid fatty cuts of meat, processed or cured meats, and poultry with skin. Fill about one fourth of your plate with lean proteins, such as fish, chicken without skin, beans, eggs, or tofu. Avoid pre-made and processed foods. These tend to be higher in sodium, added sugar, and fat. Reduce your daily sodium intake. Many people with hypertension should eat less than 1,500 mg of sodium a day. Do not drink alcohol if: Your health care provider tells you not to drink. You are pregnant, may be pregnant, or are planning to become pregnant. If you drink alcohol: Limit how much you have to: 0-1 drink a day for women. 0-2 drinks a day for men. Know how much alcohol is in your drink. In the [place] one drink equals one 12 oz bottle of beer (355 mL), one 5 oz glass of wine (148 mL), or one 1 oz glass of hard liquor (44 mL). Lifestyle  Work with your health care provider to maintain a healthy body weight or to lose weight. Ask what an ideal weight is for you. Get at least 30 minutes of exercise that causes your heart to beat faster (aerobic exercise) most days of the week. Activities may include walking, swimming, or biking. Include exercise to strengthen your muscles (resistance exercise), such as Pilates or lifting weights, as part of your weekly exercise routine. Try to do these types of exercises for 30 minutes at least 3 days a week. Do not use any products that contain nicotine or tobacco. These products include cigarettes, chewing tobacco, and vaping devices, such as e-cigarettes. If you need help quitting, ask your health care provider. Monitor your blood pressure at home as told by your health care provider. Keep all follow-up visits. This is important. Medicines Take over-the-counter and prescription medicines only as told by your health care provider. Follow directions carefully. Blood  pressure medicines must be taken as prescribed. Do not skip doses of blood pressure medicine. Doing this puts you at risk for problems and can make the medicine less effective. Ask your health care provider about side effects or reactions to medicines that you should watch for. Contact a health care provider if you: Think you are having a reaction to a medicine you are taking. Have headaches that keep coming back (recurring). Feel dizzy. Have swelling in your ankles. Have trouble with your vision. Get help right away if you: Develop a severe headache or confusion. Have unusual weakness or numbness. Feel faint. Have severe pain in your chest or abdomen. Vomit repeatedly. Have trouble breathing. These symptoms may be an emergency. Get help right away. Call 911. Do not wait to see if the symptoms will go away. Do not drive yourself to the hospital. Summary Hypertension is when the force of blood pumping through your arteries is too strong. If this condition is not controlled, it may put you at risk for serious complications. Your personal target blood pressure may vary depending on your medical conditions, your age, and other factors. For most people, a normal blood pressure is less than 120/80. Hypertension is treated with lifestyle changes, medicines, or a combination of both. Lifestyle changes include losing weight, eating a healthy,   low-sodium diet, exercising more, and limiting alcohol. This information is not intended to replace advice given to you by your health care provider. Make sure you discuss any questions you have with your health care provider. Document Revised: 10/03/2021 Document Reviewed: 10/03/2021 [employer] Patient Education  2024 [employer]  

## 2023-06-19 NOTE — Assessment & Plan Note (Signed)
Chronic, ongoing with BP elevated today - has not taken medication this morning (is out in car).  Recent episode of hypotension after taking his medication as ordered -- prior he had only been taking Hydralazine and Carvedilol.  Has not seen cardiology since August 2023, highly recommend he schedule follow-up with them.  Recommend he monitor BP at least a few mornings a week at home and document.  DASH diet at home.  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  Labs up to date.  Return in 1 week.

## 2023-06-25 ENCOUNTER — Ambulatory Visit: Payer: Medicare Other

## 2023-06-25 VITALS — Ht 67.0 in | Wt 208.0 lb

## 2023-06-25 DIAGNOSIS — Z0000 Encounter for general adult medical examination without abnormal findings: Secondary | ICD-10-CM

## 2023-06-25 NOTE — Progress Notes (Signed)
Subjective:   Derek Cooley. is a 57 y.o. male who presents for Medicare Annual/Subsequent preventive examination.  Per patient no change in vitals since last visit, unable to obtain new vitals due to telehealth visit   Visit Complete: Virtual  I connected with  Derek Cooley. on 06/25/23 by a audio enabled telemedicine application and verified that I am speaking with the correct person using two identifiers. Spoke w/ sister [name] Real  Patient Location: Home  Provider Location: Office/Clinic  I discussed the limitations of evaluation and management by telemedicine. The patient expressed understanding and agreed to proceed.  Review of Systems     Cardiac Risk Factors include: advanced age (>52men, >66 women);diabetes mellitus;male gender;obesity (BMI >30kg/m2);sedentary lifestyle;family history of premature cardiovascular disease     Objective:    Today's Vitals   06/25/23 1358 06/25/23 1410  Weight:  208 lb (94.3 kg)  Height:  5\' 7"  (1.702 m)  PainSc: 4     Body mass index is 32.58 kg/m.     06/25/2023    2:03 PM 06/11/2023    1:02 PM 04/26/2023    2:56 PM 03/08/2023    4:56 PM 01/05/2023    1:40 PM 11/23/2022    1:35 AM 11/22/2022    1:17 PM  Advanced Directives  Does Patient Have a Medical Advance Directive? Yes No No No No  No  Type of Estate agent of [place];Living will        Does patient want to make changes to medical advance directive? No - Patient declined        Copy of Healthcare Power of Attorney in Chart? Yes - validated most recent copy scanned in chart (See row information)        Would patient like information on creating a medical advance directive? No - Patient declined No - Patient declined  No - Patient declined  No - Patient declined     Current Medications (verified) Outpatient Encounter Medications as of 06/25/2023  Medication Sig   albuterol (VENTOLIN HFA) 108 (90 Base) MCG/ACT inhaler Inhale 1-2 puffs into the  lungs every 6 (six) hours as needed for wheezing or shortness of breath.   carvedilol (COREG) 25 MG tablet Take 25 mg by mouth 2 (two) times daily with a meal.   glucose blood (ONETOUCH ULTRA) test strip USE TO TEST BLOOD SUGAR DAILY   hydrALAZINE (APRESOLINE) 100 MG tablet Take 100 mg by mouth 2 (two) times daily.   lidocaine-prilocaine (EMLA) cream Apply 1 Application topically daily.   losartan (COZAAR) 25 MG tablet Take 1 tablet (25 mg total) by mouth daily.   OXYGEN Inhale 3 L into the lungs daily.   pantoprazole (PROTONIX) 40 MG tablet TAKE 1 TABLET(40 MG) BY MOUTH TWICE DAILY AS NEEDED   topiramate (TOPAMAX) 50 MG tablet TAKE 1 TABLET(50 MG) BY MOUTH TWICE DAILY   torsemide (DEMADEX) 5 MG tablet Take 1 tablet (5 mg total) by mouth daily. Hold this on dialysis days.   TRELEGY ELLIPTA 100-62.5-25 MCG/ACT AEPB Inhale 1 puff into the lungs daily.   DULoxetine (CYMBALTA) 60 MG capsule Take 1 capsule (60 mg total) by mouth daily.   No facility-administered encounter medications on file as of 06/25/2023.    Allergies (verified) Atorvastatin, Avelox [moxifloxacin hcl in nacl], Buprenorphine, Dilaudid [hydromorphone hcl], Fluoxetine, Levofloxacin, Morphine and codeine, Other, Suboxone [buprenorphine hcl-naloxone hcl], and Vancomycin   History: Past Medical History:  Diagnosis Date   Abdominal abscess  a.) chronic MRSA infection   Acute pancreatitis    Anxiety    Asthma    Brain aneurysm    a.) 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 b.) RIGHT crainotomy for clip ligation 05/14/2019.   CHF (congestive heart failure) (HCC)    Chronic back pain    Chronic heart failure with preserved ejection fraction (HFpEF) (HCC)    a. 11/2021 Echo: EF 55-60%, no rwma, mod LVH, GrI DD. Nl RV size/fxn. Mildly dil LA.   CKD (chronic kidney disease), stage V (HCC)    Depression    Emphysema of lung (HCC)    Erectile dysfunction     Followed by palliative care service    GERD (gastroesophageal reflux disease)    History of methicillin resistant staphylococcus aureus (MRSA)    HLD (hyperlipidemia)    Hypertension    Mild cognitive impairment    MRSA (methicillin resistant Staphylococcus aureus)    Obesity    OSA (obstructive sleep apnea) 2013   a.) not compliant with nocturnal PAP therapy; CPAP machine "lost or stolen".   Panic disorder    Perforated bowel (HCC) 12/10/2005   Tempoary Colostomy Bag, Skin Graft for Abd wound   Polysubstance abuse (HCC)    a.) ETOH, tobacco, marijuana, methamphetamines, cocaine, BZO, opioids.   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   T2DM (type 2 diabetes mellitus) (HCC)    Tobacco abuse    Type 2 diabetes mellitus without complication, without long-term current use of insulin (HCC) 04/17/2016   Vitreous hemorrhage (HCC) 03/27/2011   Overview:  Bilateral; 01/2010, from [facility]    Past Surgical History:  Procedure Laterality Date   A/V FISTULAGRAM Left 07/16/2022   Procedure: A/V Fistulagram;  Surgeon: Annice Needy, MD;  Location: [facility] INVASIVE CV LAB;  Service: Cardiovascular;  Laterality: Left;   AV FISTULA PLACEMENT Left 05/24/2022   Procedure: ARTERIOVENOUS (AV) FISTULA CREATION ( RADIAL CEPHALIC);  Surgeon: Annice Needy, MD;  Location: [facility] ORS;  Service: Vascular;  Laterality: Left;   CEREBRAL ANEURYSM REPAIR Right 01/26/2010   Procedure: CEREBRAL ANEURYSM REPAIR (COIL EMBOLIZATION)   CEREBRAL ANEURYSM REPAIR Right 05/14/2019   Procedure: CRAINOTOMY FOR CEREBRAL ANEURYSM REPAIR (CLIP LIGATION)   COLON SURGERY  12/10/2005   colostomy bag placed s/p perforated bowel   COLONOSCOPY WITH PROPOFOL N/A 05/18/2020   Procedure: COLONOSCOPY WITH PROPOFOL;  Surgeon: Toney Reil, MD;  Location: [facility] ENDOSCOPY;  Service: Gastroenterology;  Laterality: N/A;    DIALYSIS/PERMA CATHETER INSERTION N/A 06/27/2022   Procedure: DIALYSIS/PERMA CATHETER INSERTION;  Surgeon: Renford Dills, MD;  Location: [facility];  Service: Cardiovascular;  Laterality: N/A;   DIALYSIS/PERMA CATHETER REMOVAL N/A 05/20/2023   Procedure: DIALYSIS/PERMA CATHETER REMOVAL;  Surgeon: Annice Needy, MD;  Location: [facility] INV[facility];  Service: Cardiovascular;  Laterality: N/A;   KNEE SURGERY Right    Perforated bowel     Family History  Problem Relation Age of Onset   Arthritis Mother    Asthma Mother    Diabetes Mother    Heart disease Mother    Hyperlipidemia Mother    Hypertension Mother    Kidney disease Mother    Thyroid disease Mother    Lung disease Mother    Anxiety disorder Mother    Depression Mother    Diabetes Father    Heart disease  Father    Depression Father    Anxiety disorder Father    Arthritis Sister    Asthma Sister    Hyperlipidemia Sister    Hypertension Sister    Lung disease Sister    Anxiety disorder Sister    Depression Sister    Hyperlipidemia Brother    Hypertension Brother    Diabetes Sister    Heart disease Sister    Depression Sister    Anxiety disorder Sister    Anxiety disorder Brother    Depression Brother    Heart disease Brother    Social History   Socioeconomic History   Marital status: Divorced    Spouse name: Not on file   Number of children: Not on file   Years of education: Not on file   Highest education level: Not on file  Occupational History   Not on file  Tobacco Use   Smoking status: Every Day    Current packs/day: 1.00    Average packs/day: 1 pack/day for 40.0 years (40.0 ttl pk-yrs)    Types: Cigarettes    Start date: 10/11/1984   Smokeless tobacco: Never  Vaping Use   Vaping status: Never Used  Substance and Sexual Activity   Alcohol use: Yes   Drug use: Not Currently    Types: "Crack" cocaine, Amphetamines, Methamphetamines, Benzodiazepines, Cocaine, Marijuana, Other-see comments     Comment: opioids   Sexual activity: Not Currently  Other Topics Concern   Not on file  Social History Narrative   Lives alone and has nursing assistance help that lives in.   Social Determinants of Health   Financial Resource Strain: Low Risk  (06/25/2023)   Overall Financial Resource Strain (CARDIA)    Difficulty of Paying Living Expenses: Not hard at all  Food Insecurity: No Food Insecurity (06/25/2023)   Hunger Vital Sign    Worried About Running Out of Food in the Last Year: Never true    Ran Out of Food in the Last Year: Never true  Transportation Needs: No Transportation Needs (06/25/2023)   PRAPARE - Administrator, Civil Service (Medical): No    Lack of Transportation (Non-Medical): No  Physical Activity: Inactive (06/25/2023)   Exercise Vital Sign    Days of Exercise per Week: 0 days    Minutes of Exercise per Session: 0 min  Stress: No Stress Concern Present (06/25/2023)   [employer] of Occupational Health - Occupational Stress Questionnaire    Feeling of Stress : Only a little  Social Connections: Socially Isolated (06/25/2023)   Social Connection and Isolation Panel [NHANES]    Frequency of Communication with Friends and Family: Never    Frequency of Social Gatherings with Friends and Family: Never    Attends Religious Services: Never    Database administrator or Organizations: No    Attends Engineer, structural: Never    Marital Status: Divorced    Tobacco Counseling Ready to quit: Not Answered Counseling given: Not Answered   Clinical Intake:  Pre-visit preparation completed: Yes  Pain : 0-10 Pain Score: 4  Pain Type: Chronic pain Pain Radiating Towards: hurts all over- headaches, knees, & shoulders     Nutritional Risks: None Diabetes: Yes CBG done?: No Did pt. bring in CBG monitor from home?: No  How often do you need to have someone help you when you read instructions, pamphlets, or other written materials from your  doctor or pharmacy?: 1 - Never  Interpreter Needed?: No  Information entered by :: Kennedy Bucker, LPN   Activities of Daily Living    06/25/2023    2:03 PM 03/09/2023    1:04 AM  In your present state of health, do you have any difficulty performing the following activities:  Hearing? 0 0  Vision? 0 0  Difficulty concentrating or making decisions? 0 0  Walking or climbing stairs? 1 0  Comment breathing, on O2   Dressing or bathing? 0 0  Doing errands, shopping? 1 0  Preparing Food and eating ? N   Using the Toilet? N   In the past six months, have you accidently leaked urine? N   Do you have problems with loss of bowel control? N   Managing your Medications? Y   Managing your Finances? Y   Housekeeping or managing your Housekeeping? Y     Patient Care Team: Larae Grooms, NP as PCP - General Iran Ouch, MD as PCP - Cardiology (Cardiology) Lajean Manes, [facility] (Inactive) (Pharmacist) Eliezer Lofts, NP (Inactive) as Nurse Practitioner (Hospice and Palliative Medicine) Mady Haagensen, MD (Nephrology) Cyndi Lennert, MD as Consulting Physician (Neurology)  Indicate any recent Medical Services you may have received from other than [facility] providers in the past year (date may be approximate).     Assessment:   This is a routine wellness examination for Aldyn.  Hearing/Vision screen Hearing Screening - Comments:: No aids Vision Screening - Comments:: Wears glasses- [facility]  Dietary issues and exercise activities discussed:     Goals Addressed             This Visit's Progress    DIET - EAT MORE FRUITS AND VEGETABLES         Depression Screen    06/25/2023    2:00 PM 06/19/2023    8:58 AM 06/10/2023    1:54 PM 01/07/2023    3:42 PM 07/17/2022    2:00 PM 06/11/2022   12:51 PM 02/02/2022    3:01 PM  PHQ 2/9 Scores  PHQ - 2 Score 2 5 4 4 3 2 6   PHQ- 9 Score 5 19 18 18 12 14 24     Fall Risk    06/25/2023    2:03 PM 06/19/2023    8:58 AM  06/10/2023    1:54 PM 01/07/2023    3:41 PM 08/28/2022    4:04 PM  Fall Risk   Falls in the past year? 0 0 0 0 0  Number falls in past yr: 0 0 0 0 0  Injury with Fall? 0 0 0 0 0  Risk for fall due to : No Fall Risks No Fall Risks No Fall Risks No Fall Risks   Follow up Falls prevention discussed;Falls evaluation completed Falls evaluation completed Falls evaluation completed Falls evaluation completed Falls evaluation completed    MEDICARE RISK AT HOME:  Medicare Risk at Home - 06/25/23 1404     Any stairs in or around the home? Yes    If so, are there any without handrails? No    Home free of loose throw rugs in walkways, pet beds, electrical cords, etc? Yes    Adequate lighting in your home to reduce risk of falls? Yes    Life alert? No    Use of a cane, walker or w/c? No    Grab bars in the bathroom? No    Shower chair or bench in shower? No    Elevated toilet seat or a handicapped toilet? No  TIMED UP AND GO:  Was the test performed?  No    Cognitive Function:pt was unavailable during the visit 2024        06/11/2022   12:35 PM 11/18/2019    2:01 PM 10/09/2017   12:02 PM  6CIT Screen  What Year? 0 points 0 points 0 points  What month? 0 points 0 points 0 points  What time? 3 points 0 points 0 points  Count back from 20 2 points 0 points 0 points  Months in reverse 0 points 0 points 0 points  Repeat phrase 0 points 0 points 10 points  Total Score 5 points 0 points 10 points    Immunizations Immunization History  Administered Date(s) Administered   Influenza,inj,Quad PF,6+ Mos 10/09/2017, 08/21/2019, 12/19/2022   Influenza-Unspecified 09/20/2014, 08/25/2015   Pneumococcal Polysaccharide-23 09/03/2011, 10/06/2012, 08/15/2015   Tdap 06/04/2016    TDAP status: Up to date  Flu Vaccine status: Up to date  Pneumococcal vaccine status: Up to date  Covid-19 vaccine status: Declined, Education has been provided regarding the importance of this vaccine  but patient still declined. Advised may receive this vaccine at local pharmacy or Health Dept.or vaccine clinic. Aware to provide a copy of the vaccination record if obtained from local pharmacy or Health Dept. Verbalized acceptance and understanding.  Qualifies for Shingles Vaccine? Yes   Zostavax completed No   Shingrix Completed?: No.    Education has been provided regarding the importance of this vaccine. Patient has been advised to call insurance company to determine out of pocket expense if they have not yet received this vaccine. Advised may also receive vaccine at local pharmacy or Health Dept. Verbalized acceptance and understanding.  Screening Tests Health Maintenance  Topic Date Due   OPHTHALMOLOGY EXAM  06/30/2020   COVID-19 Vaccine (1 - 2023-24 season) 06/26/2023 (Originally 08/10/2022)   Zoster Vaccines- Shingrix (1 of 2) 09/10/2023 (Originally 11/26/2015)   INFLUENZA VACCINE  07/11/2023   HEMOGLOBIN A1C  12/11/2023   Lung Cancer Screening  01/06/2024   FOOT EXAM  06/09/2024   Medicare Annual Wellness (AWV)  06/24/2024   DTaP/Tdap/Td (2 - Td or Tdap) 06/04/2026   Colonoscopy  05/18/2030   Hepatitis C Screening  Completed   HIV Screening  Completed   HPV VACCINES  Aged Out    Health Maintenance  Health Maintenance Due  Topic Date Due   OPHTHALMOLOGY EXAM  06/30/2020    Colorectal cancer screening: Type of screening: Colonoscopy. Completed 06/17/20. Repeat every 10 years  Lung Cancer Screening: (Low Dose CT Chest recommended if Age 38-80 years, 20 pack-year currently smoking OR have quit w/in 15years.) does qualify.   Lung Cancer Screening Referral: HAD CT SCAN 01/05/23  Additional Screening:  Hepatitis C Screening: does qualify; Completed 09/07/22  Vision Screening: Recommended annual ophthalmology exams for early detection of glaucoma and other disorders of the eye. Is the patient up to date with their annual eye exam?  Yes  Who is the provider or what is the name  of the office in which the patient attends annual eye exams? [facility] If pt is not established with a provider, would they like to be referred to a provider to establish care? No .   Dental Screening: Recommended annual dental exams for proper oral hygiene  Diabetic Foot Exam: Diabetic Foot Exam: Completed 06/10/23  Community Resource Referral / Chronic Care Management: CRR required this visit?  No   CCM required this visit?  No     Plan:  I have personally reviewed and noted the following in the patient's chart:   Medical and social history Use of alcohol, tobacco or illicit drugs  Current medications and supplements including opioid prescriptions. Patient is not currently taking opioid prescriptions. Functional ability and status Nutritional status Physical activity Advanced directives List of other physicians Hospitalizations, surgeries, and ER visits in previous 12 months Vitals Screenings to include cognitive, depression, and falls Referrals and appointments  In addition, I have reviewed and discussed with patient certain preventive protocols, quality metrics, and best practice recommendations. A written personalized care plan for preventive services as well as general preventive health recommendations were provided to patient.     Hal Hope, LPN   1/61/0960   After Visit Summary: (MyChart) Due to this being a telephonic visit, the after visit summary with patients personalized plan was offered to patient via MyChart   Nurse Notes: none

## 2023-06-25 NOTE — Patient Instructions (Signed)
[name] , Thank you for taking time to come for your Medicare Wellness Visit. I appreciate your ongoing commitment to your health goals. Please review the following plan we discussed and let me know if I can assist you in the future.   These are the goals we discussed:  Goals       DIET - EAT MORE FRUITS AND VEGETABLES      Patient Stated      Kidney Function Lab Results  Component Value Date/Time   CREATININE 1.41 (H) 06/29/2020 10:33 AM   CREATININE 1.92 (H) 03/23/2020 01:22 PM   CREATININE 1.55 (H) 08/12/2014 10:19 AM   CREATININE 1.44 (H) 06/26/2012 12:31 PM   GFRNONAA 56 (L) 06/29/2020 10:33 AM   GFRNONAA 52 (L) 08/12/2014 10:19 AM   GFRAA 65 06/29/2020 10:33 AM   GFRAA >60 08/12/2014 10:19 AM   K 4.4 06/29/2020 10:33 AM   K 4.3 03/23/2020 01:22 PM   K 3.9 08/12/2014 10:19 AM   K 3.4 (L) 06/26/2012 12:31 PM         PharmD "I have a lot of medications" (pt-stated)      Current Barriers:  Polypharmacy; complex patient with multiple comorbidities including hx cerebral aneurysm, cognitive impairment, depression, chronic headaches, CKD, tobacco abuse, hx polysubstance abuse His sister, [name], providers care and manages his medications. Notes they have switched to an [employer] plan that should have cheaper medication copays through the mail order pharmacy. She is filling pill boxes a month at a time for him.  COPD/tobacco abuse: Anoro, though collaboratively decided to apply for [employer] assistance through [employer], as patient would not qualify for [employer] assistance for Anoro due to their out of pocket spend requirement. Need updated proof of income. [name] notes that she thinks they received a LIS denial letter from [facility], but is unsure.  Pain Management: started on Suboxone, but had allergic reaction. Is now on Nucynta 100 mg QID + trazodone 100 mg. Notes they have follow up scheduled tomorrow HTN: amlodipine 10 mg daily, carvedilol 12.5 mg BID, lisinopril 10 mg  daily Chronic HA: follows w/ HA specialist at [facility]; tizanidine 4 mg QAM, 4 mg Qmidday, 12 mg QPM; topiramate 50 mg BID Anxiety: duloxetine 60 mg BID, hydroxyzine 25 mg TID  T2DM: glipizide XL 5 mg daily; due for updated A1c. [name] reports random readings ~130s Hx GI side effects w/ metformin Lipids: fenofibrate 67 mg daily; ASCVD risk 37%; noted intolerance to atorvastatin 10 mg d/t myalgias   Pharmacist Clinical Goal(s):  Over the next 90 days, patient will work with PharmD and provider towards optimized medication management  Interventions: Comprehensive medication review performed, medication list updated in electronic medical record.  Reviewed need for updated income information, as well as [employer] LIS denial letter. [name] is coming with patient to his PCP appointment next week, and will work to bring this information to me. If she is able to bring it, I will pass along to CPhT to pursue assistance for [employer] for seeking an insurance plan that provides more suited coverage to their needs. Reviewed that she fills pill boxes on patient's behalf, and has pill boxes at home. She will discuss which medications need refills sent to [facility] mail order w/ PCP next week Discussed T2DM control. Most recent eGFR is not sufficient for SGLT2 or metformin rechallenge. Could consider GLP1, though patient has hx pancreatitis and there are mixed recommendations regarding GLP1 use, if the cause of pancreatitis was something reversible. Safest  options would include glipizide titration to max 10 mg daily vs addition of basal insulin, which has its concerns. Will continue to follow and support PCP pending next lab work  Patient Self Care Activities:  Patient will take medications as prescribed Patient will commit to reducing sweet beverage consumption.  Please see past updates related to this goal by clicking on the "Past Updates" button in the selected goal        Quit Smoking      Quit smoking /  using tobacco      Smoking cessation discussed         This is a list of the screening recommended for you and due dates:  Health Maintenance  Topic Date Due   Eye exam for diabetics  06/30/2020   COVID-19 Vaccine (1 - 2023-24 season) 06/26/2023*   Zoster (Shingles) Vaccine (1 of 2) 09/10/2023*   Flu Shot  07/11/2023   Hemoglobin A1C  12/11/2023   Screening for Lung Cancer  01/06/2024   Complete foot exam   06/09/2024   Medicare Annual Wellness Visit  06/24/2024   DTaP/Tdap/Td vaccine (2 - Td or Tdap) 06/04/2026   Colon Cancer Screening  05/18/2030   Hepatitis C Screening  Completed   HIV Screening  Completed   HPV Vaccine  Aged Out  *Topic was postponed. The date shown is not the original due date.    Advanced directives: yes  Conditions/risks identified: none  Next appointment: Follow up in one year for your annual wellness visit 06/30/24 @ 10:15 am by phone  Preventive Care 40-64 Years, Male Preventive care refers to lifestyle choices and visits with your health care provider that can promote health and wellness. What does preventive care include? A yearly physical exam. This is also called an annual well check. Dental exams once or twice a year. Routine eye exams. Ask your health care provider how often you should have your eyes checked. Personal lifestyle choices, including: Daily care of your teeth and gums. Regular physical activity. Eating a healthy diet. Avoiding tobacco and drug use. Limiting alcohol use. Practicing safe sex. Taking low-dose aspirin every day starting at age 56. What happens during an annual well check? The services and screenings done by your health care provider during your annual well check will depend on your age, overall health, lifestyle risk factors, and family history of disease. Counseling  Your health care provider may ask you questions about your: Alcohol use. Tobacco use. Drug use. Emotional well-being. Home and relationship  well-being. Sexual activity. Eating habits. Work and work Astron[employer]. Screening  You may have the following tests or measurements: Height, weight, and BMI. Blood pressure. Lipid and cholesterol levels. These may be checked every 5 years, or more frequently if you are over 71 years old. Skin check. Lung cancer screening. You may have this screening every year starting at age 59 if you have a 30-pack-year history of smoking and currently smoke or have quit within the past 15 years. Fecal occult blood test (FOBT) of the stool. You may have this test every year starting at age 49. Flexible sigmoidoscopy or colonoscopy. You may have a sigmoidoscopy every 5 years or a colonoscopy every 10 years starting at age 68. Prostate cancer screening. Recommendations will vary depending on your family history and other risks. Hepatitis C blood test. Hepatitis B blood test. Sexually transmitted disease (STD) testing. Diabetes screening. This is done by checking your blood sugar (glucose) after you have not eaten for a while (fasting).  You may have this done every 1-3 years. Discuss your test results, treatment options, and if necessary, the need for more tests with your health care provider. Vaccines  Your health care provider may recommend certain vaccines, such as: Influenza vaccine. This is recommended every year. Tetanus, diphtheria, and acellular pertussis (Tdap, Td) vaccine. You may need a Td booster every 10 years. Zoster vaccine. You may need this after age 55. Pneumococcal 13-valent conjugate (PCV13) vaccine. You may need this if you have certain conditions and have not been vaccinated. Pneumococcal polysaccharide (PPSV23) vaccine. You may need one or two doses if you smoke cigarettes or if you have certain conditions. Talk to your health care provider about which screenings and vaccines you need and how often you need them. This information is not intended to replace advice given to you by your  health care provider. Make sure you discuss any questions you have with your health care provider. Document Released: 12/23/2015 Document Revised: 08/15/2016 Document Reviewed: 09/27/2015 Elsevier Interactive Patient Education  2017 [employer].  Fall Prevention in the Home Falls can cause injuries. They can happen to people of all ages. There are many things you can do to make your home safe and to help prevent falls. What can I do on the outside of my home? Regularly fix the edges of walkways and driveways and fix any cracks. Remove anything that might make you trip as you walk through a door, such as a raised step or threshold. Trim any bushes or trees on the path to your home. Use bright outdoor lighting. Clear any walking paths of anything that might make someone trip, such as rocks or tools. Regularly check to see if handrails are loose or broken. Make sure that both sides of any steps have handrails. Any raised decks and porches should have guardrails on the edges. Have any leaves, snow, or ice cleared regularly. Use sand or salt on walking paths during winter. Clean up any spills in your garage right away. This includes oil or grease spills. What can I do in the bathroom? Use night lights. Install grab bars by the toilet and in the tub and shower. Do not use towel bars as grab bars. Use non-skid mats or decals in the tub or shower. If you need to sit down in the shower, use a plastic, non-slip stool. Keep the floor dry. Clean up any water that spills on the floor as soon as it happens. Remove soap buildup in the tub or shower regularly. Attach bath mats securely with double-sided non-slip rug tape. Do not have throw rugs and other things on the floor that can make you trip. What can I do in the bedroom? Use night lights. Make sure that you have a light by your bed that is easy to reach. Do not use any sheets or blankets that are too big for your bed. They should not hang down  onto the floor. Have a firm chair that has side arms. You can use this for support while you get dressed. Do not have throw rugs and other things on the floor that can make you trip. What can I do in the kitchen? Clean up any spills right away. Avoid walking on wet floors. Keep items that you use a lot in easy-to-reach places. If you need to reach something above you, use a strong step stool that has a grab bar. Keep electrical cords out of the way. Do not use floor polish or wax that makes floors  slippery. If you must use wax, use non-skid floor wax. Do not have throw rugs and other things on the floor that can make you trip. What can I do with my stairs? Do not leave any items on the stairs. Make sure that there are handrails on both sides of the stairs and use them. Fix handrails that are broken or loose. Make sure that handrails are as long as the stairways. Check any carpeting to make sure that it is firmly attached to the stairs. Fix any carpet that is loose or worn. Avoid having throw rugs at the top or bottom of the stairs. If you do have throw rugs, attach them to the floor with carpet tape. Make sure that you have a light switch at the top of the stairs and the bottom of the stairs. If you do not have them, ask someone to add them for you. What else can I do to help prevent falls? Wear shoes that: Do not have high heels. Have rubber bottoms. Are comfortable and fit you well. Are closed at the toe. Do not wear sandals. If you use a stepladder: Make sure that it is fully opened. Do not climb a closed stepladder. Make sure that both sides of the stepladder are locked into place. Ask someone to hold it for you, if possible. Clearly mark and make sure that you can see: Any grab bars or handrails. First and last steps. Where the edge of each step is. Use tools that help you move around (mobility aids) if they are needed. These include: Canes. Walkers. Scooters. Crutches. Turn  on the lights when you go into a dark area. Replace any light bulbs as soon as they burn out. Set up your furniture so you have a clear path. Avoid moving your furniture around. If any of your floors are uneven, fix them. If there are any pets around you, be aware of where they are. Review your medicines with your doctor. Some medicines can make you feel dizzy. This can increase your chance of falling. Ask your doctor what other things that you can do to help prevent falls. This information is not intended to replace advice given to you by your health care provider. Make sure you discuss any questions you have with your health care provider. Document Released: 09/22/2009 Document Revised: 05/03/2016 Document Reviewed: 12/31/2014 Elsevier Interactive Patient Education  2017 [employer].

## 2023-06-27 ENCOUNTER — Inpatient Hospital Stay
Admission: EM | Admit: 2023-06-27 | Discharge: 2023-06-29 | DRG: 673 | Disposition: A | Discharge status: Home / Self Care | Payer: Medicare Other | Attending: Obstetrics and Gynecology | Admitting: Obstetrics and Gynecology

## 2023-06-27 ENCOUNTER — Other Ambulatory Visit: Payer: Self-pay

## 2023-06-27 ENCOUNTER — Emergency Department: Payer: Medicare Other

## 2023-06-27 DIAGNOSIS — D631 Anemia in chronic kidney disease: Secondary | ICD-10-CM | POA: Diagnosis present

## 2023-06-27 DIAGNOSIS — Z7951 Long term (current) use of inhaled steroids: Secondary | ICD-10-CM

## 2023-06-27 DIAGNOSIS — Z992 Dependence on renal dialysis: Secondary | ICD-10-CM | POA: Diagnosis not present

## 2023-06-27 DIAGNOSIS — G4733 Obstructive sleep apnea (adult) (pediatric): Secondary | ICD-10-CM | POA: Diagnosis present

## 2023-06-27 DIAGNOSIS — Z8249 Family history of ischemic heart disease and other diseases of the circulatory system: Secondary | ICD-10-CM

## 2023-06-27 DIAGNOSIS — Z9981 Dependence on supplemental oxygen: Secondary | ICD-10-CM

## 2023-06-27 DIAGNOSIS — T8241XA Breakdown (mechanical) of vascular dialysis catheter, initial encounter: Secondary | ICD-10-CM | POA: Diagnosis not present

## 2023-06-27 DIAGNOSIS — F17210 Nicotine dependence, cigarettes, uncomplicated: Secondary | ICD-10-CM | POA: Diagnosis not present

## 2023-06-27 DIAGNOSIS — Z720 Tobacco use: Secondary | ICD-10-CM | POA: Diagnosis present

## 2023-06-27 DIAGNOSIS — E1159 Type 2 diabetes mellitus with other circulatory complications: Secondary | ICD-10-CM | POA: Diagnosis not present

## 2023-06-27 DIAGNOSIS — Z79899 Other long term (current) drug therapy: Secondary | ICD-10-CM

## 2023-06-27 DIAGNOSIS — F32A Depression, unspecified: Secondary | ICD-10-CM | POA: Diagnosis present

## 2023-06-27 DIAGNOSIS — J439 Emphysema, unspecified: Secondary | ICD-10-CM | POA: Diagnosis present

## 2023-06-27 DIAGNOSIS — K219 Gastro-esophageal reflux disease without esophagitis: Secondary | ICD-10-CM | POA: Diagnosis present

## 2023-06-27 DIAGNOSIS — J9611 Chronic respiratory failure with hypoxia: Secondary | ICD-10-CM | POA: Diagnosis present

## 2023-06-27 DIAGNOSIS — J449 Chronic obstructive pulmonary disease, unspecified: Secondary | ICD-10-CM | POA: Diagnosis present

## 2023-06-27 DIAGNOSIS — I5032 Chronic diastolic (congestive) heart failure: Secondary | ICD-10-CM | POA: Diagnosis present

## 2023-06-27 DIAGNOSIS — F1910 Other psychoactive substance abuse, uncomplicated: Secondary | ICD-10-CM | POA: Diagnosis present

## 2023-06-27 DIAGNOSIS — Z8349 Family history of other endocrine, nutritional and metabolic diseases: Secondary | ICD-10-CM

## 2023-06-27 DIAGNOSIS — G43909 Migraine, unspecified, not intractable, without status migrainosus: Secondary | ICD-10-CM | POA: Diagnosis present

## 2023-06-27 DIAGNOSIS — F1410 Cocaine abuse, uncomplicated: Secondary | ICD-10-CM | POA: Diagnosis present

## 2023-06-27 DIAGNOSIS — E1129 Type 2 diabetes mellitus with other diabetic kidney complication: Secondary | ICD-10-CM | POA: Diagnosis present

## 2023-06-27 DIAGNOSIS — J4489 Other specified chronic obstructive pulmonary disease: Secondary | ICD-10-CM | POA: Diagnosis present

## 2023-06-27 DIAGNOSIS — G8929 Other chronic pain: Secondary | ICD-10-CM

## 2023-06-27 DIAGNOSIS — Z841 Family history of disorders of kidney and ureter: Secondary | ICD-10-CM

## 2023-06-27 DIAGNOSIS — Z6832 Body mass index (BMI) 32.0-32.9, adult: Secondary | ICD-10-CM

## 2023-06-27 DIAGNOSIS — I5030 Unspecified diastolic (congestive) heart failure: Secondary | ICD-10-CM | POA: Diagnosis present

## 2023-06-27 DIAGNOSIS — Z83438 Family history of other disorder of lipoprotein metabolism and other lipidemia: Secondary | ICD-10-CM

## 2023-06-27 DIAGNOSIS — M25512 Pain in left shoulder: Secondary | ICD-10-CM | POA: Diagnosis present

## 2023-06-27 DIAGNOSIS — I132 Hypertensive heart and chronic kidney disease with heart failure and with stage 5 chronic kidney disease, or end stage renal disease: Secondary | ICD-10-CM | POA: Diagnosis present

## 2023-06-27 DIAGNOSIS — R4189 Other symptoms and signs involving cognitive functions and awareness: Secondary | ICD-10-CM | POA: Diagnosis present

## 2023-06-27 DIAGNOSIS — I152 Hypertension secondary to endocrine disorders: Secondary | ICD-10-CM

## 2023-06-27 DIAGNOSIS — I10 Essential (primary) hypertension: Secondary | ICD-10-CM | POA: Diagnosis present

## 2023-06-27 DIAGNOSIS — E1122 Type 2 diabetes mellitus with diabetic chronic kidney disease: Secondary | ICD-10-CM | POA: Diagnosis present

## 2023-06-27 DIAGNOSIS — Z8261 Family history of arthritis: Secondary | ICD-10-CM

## 2023-06-27 DIAGNOSIS — T82898A Other specified complication of vascular prosthetic devices, implants and grafts, initial encounter: Secondary | ICD-10-CM | POA: Diagnosis present

## 2023-06-27 DIAGNOSIS — F410 Panic disorder [episodic paroxysmal anxiety] without agoraphobia: Secondary | ICD-10-CM | POA: Diagnosis present

## 2023-06-27 DIAGNOSIS — Z833 Family history of diabetes mellitus: Secondary | ICD-10-CM

## 2023-06-27 DIAGNOSIS — E669 Obesity, unspecified: Secondary | ICD-10-CM | POA: Insufficient documentation

## 2023-06-27 DIAGNOSIS — N186 End stage renal disease: Secondary | ICD-10-CM | POA: Diagnosis not present

## 2023-06-27 DIAGNOSIS — Z825 Family history of asthma and other chronic lower respiratory diseases: Secondary | ICD-10-CM

## 2023-06-27 DIAGNOSIS — F1510 Other stimulant abuse, uncomplicated: Secondary | ICD-10-CM | POA: Diagnosis present

## 2023-06-27 DIAGNOSIS — Y712 Prosthetic and other implants, materials and accessory cardiovascular devices associated with adverse incidents: Secondary | ICD-10-CM | POA: Diagnosis present

## 2023-06-27 DIAGNOSIS — E785 Hyperlipidemia, unspecified: Secondary | ICD-10-CM | POA: Diagnosis present

## 2023-06-27 LAB — BASIC METABOLIC PANEL
Anion gap: 10 (ref 5–15)
BUN: 41 mg/dL — ABNORMAL HIGH (ref 6–20)
CO2: 23 mmol/L (ref 22–32)
Calcium: 8.3 mg/dL — ABNORMAL LOW (ref 8.9–10.3)
Chloride: 105 mmol/L (ref 98–111)
Creatinine, Ser: 6.01 mg/dL — ABNORMAL HIGH (ref 0.61–1.24)
GFR, Estimated: 10 mL/min — ABNORMAL LOW (ref >60)
Glucose, Bld: 115 mg/dL — ABNORMAL HIGH (ref 70–99)
Potassium: 3.8 mmol/L (ref 3.5–5.1)
Sodium: 138 mmol/L (ref 135–145)

## 2023-06-27 LAB — CBC
HCT: 33.2 % — ABNORMAL LOW (ref 39.0–52.0)
Hemoglobin: 11.5 g/dL — ABNORMAL LOW (ref 13.0–17.0)
MCH: 32.3 pg (ref 26.0–34.0)
MCHC: 34.6 g/dL (ref 30.0–36.0)
MCV: 93.3 fL (ref 80.0–100.0)
Platelets: 309 10*3/uL (ref 150–400)
RBC: 3.56 MIL/uL — ABNORMAL LOW (ref 4.22–5.81)
RDW: 15.1 % (ref 11.5–15.5)
WBC: 8.8 10*3/uL (ref 4.0–10.5)
nRBC: 0 % (ref 0.0–0.2)

## 2023-06-27 MED ORDER — SODIUM CHLORIDE 0.9% FLUSH
3.0000 mL | Freq: Two times a day (BID) | INTRAVENOUS | Status: DC
  Administered 2023-06-27 – 2023-06-29 (×4): 3 mL via INTRAVENOUS

## 2023-06-27 MED ORDER — FLUTICASONE FUROATE-VILANTEROL 100-25 MCG/ACT IN AEPB
1.0000 | INHALATION_SPRAY | Freq: Every day | RESPIRATORY_TRACT | Status: DC
  Administered 2023-06-28 – 2023-06-29 (×2): 1 via RESPIRATORY_TRACT
  Filled 2023-06-27: qty 28

## 2023-06-27 MED ORDER — LOSARTAN POTASSIUM 50 MG PO TABS
25.0000 mg | ORAL_TABLET | Freq: Once | ORAL | Status: AC
  Administered 2023-06-27: 25 mg via ORAL
  Filled 2023-06-27: qty 1

## 2023-06-27 MED ORDER — CARVEDILOL 25 MG PO TABS
25.0000 mg | ORAL_TABLET | Freq: Two times a day (BID) | ORAL | Status: DC
  Administered 2023-06-28 – 2023-06-29 (×2): 25 mg via ORAL
  Filled 2023-06-27 (×2): qty 1

## 2023-06-27 MED ORDER — HYDRALAZINE HCL 50 MG PO TABS: 100.0000 mg | ORAL_TABLET | Freq: Two times a day (BID) | ORAL | Status: DC

## 2023-06-27 MED ORDER — OXYCODONE HCL 5 MG PO TABS
5.0000 mg | ORAL_TABLET | Freq: Two times a day (BID) | ORAL | Status: DC | PRN
  Administered 2023-06-27: 5 mg via ORAL
  Filled 2023-06-27: qty 1

## 2023-06-27 MED ORDER — CARVEDILOL 25 MG PO TABS
25.0000 mg | ORAL_TABLET | Freq: Once | ORAL | Status: AC
  Administered 2023-06-27: 25 mg via ORAL
  Filled 2023-06-27: qty 1

## 2023-06-27 MED ORDER — ONDANSETRON HCL 4 MG PO TABS: 4.0000 mg | ORAL_TABLET | Freq: Four times a day (QID) | ORAL | Status: DC | PRN

## 2023-06-27 MED ORDER — LOSARTAN POTASSIUM 25 MG PO TABS
25.0000 mg | ORAL_TABLET | Freq: Every day | ORAL | Status: DC
  Administered 2023-06-29: 25 mg via ORAL
  Filled 2023-06-27: qty 1

## 2023-06-27 MED ORDER — TOPIRAMATE 25 MG PO TABS
50.0000 mg | ORAL_TABLET | Freq: Two times a day (BID) | ORAL | Status: DC
  Administered 2023-06-27 – 2023-06-29 (×3): 50 mg via ORAL
  Filled 2023-06-27 (×5): qty 2

## 2023-06-27 MED ORDER — ONDANSETRON HCL 4 MG/2ML IJ SOLN: 4.0000 mg | Freq: Four times a day (QID) | INTRAMUSCULAR | Status: DC | PRN

## 2023-06-27 MED ORDER — UMECLIDINIUM BROMIDE 62.5 MCG/ACT IN AEPB
1.0000 | INHALATION_SPRAY | Freq: Every day | RESPIRATORY_TRACT | Status: DC
  Administered 2023-06-28 – 2023-06-29 (×2): 1 via RESPIRATORY_TRACT
  Filled 2023-06-27: qty 7

## 2023-06-27 MED ORDER — FUROSEMIDE 10 MG/ML IJ SOLN: 40.0000 mg | Freq: Once | INTRAMUSCULAR | Status: DC

## 2023-06-27 MED ORDER — IPRATROPIUM-ALBUTEROL 0.5-2.5 (3) MG/3ML IN SOLN
3.0000 mL | Freq: Four times a day (QID) | RESPIRATORY_TRACT | Status: DC | PRN
  Administered 2023-06-28: 3 mL via RESPIRATORY_TRACT

## 2023-06-27 MED ORDER — LIDOCAINE 5 % EX PTCH
1.0000 | MEDICATED_PATCH | CUTANEOUS | Status: DC
  Administered 2023-06-27 – 2023-06-28 (×2): 1 via TRANSDERMAL
  Filled 2023-06-27 (×2): qty 1

## 2023-06-27 MED ORDER — ACETAMINOPHEN 325 MG PO TABS
650.0000 mg | ORAL_TABLET | Freq: Four times a day (QID) | ORAL | Status: DC | PRN
  Administered 2023-06-28: 650 mg via ORAL
  Filled 2023-06-27 (×2): qty 2

## 2023-06-27 MED ORDER — POLYETHYLENE GLYCOL 3350 17 G PO PACK: 17.0000 g | PACK | Freq: Every day | ORAL | Status: DC | PRN

## 2023-06-27 MED ORDER — ACETAMINOPHEN 650 MG RE SUPP: 650.0000 mg | Freq: Four times a day (QID) | RECTAL | Status: DC | PRN

## 2023-06-27 MED ORDER — HYDRALAZINE HCL 50 MG PO TABS
100.0000 mg | ORAL_TABLET | Freq: Three times a day (TID) | ORAL | Status: DC
  Administered 2023-06-28 – 2023-06-29 (×5): 100 mg via ORAL
  Filled 2023-06-27 (×5): qty 2

## 2023-06-27 MED ORDER — TORSEMIDE 10 MG PO TABS
5.0000 mg | ORAL_TABLET | Freq: Every day | ORAL | Status: DC
  Filled 2023-06-27: qty 0.5

## 2023-06-27 MED ORDER — OXYCODONE HCL 5 MG PO TABS
5.0000 mg | ORAL_TABLET | Freq: Four times a day (QID) | ORAL | Status: DC | PRN
  Administered 2023-06-28 (×2): 5 mg via ORAL
  Filled 2023-06-27 (×2): qty 1

## 2023-06-27 MED ORDER — HYDRALAZINE HCL 50 MG PO TABS
100.0000 mg | ORAL_TABLET | Freq: Once | ORAL | Status: AC
  Administered 2023-06-27: 100 mg via ORAL
  Filled 2023-06-27: qty 2

## 2023-06-27 NOTE — Assessment & Plan Note (Signed)
Significant hypertension noted on presentation, likely due to missed dialysis.  He is asymptomatic.  - Resume home antihypertensives - One-time dose of Lasix

## 2023-06-27 NOTE — ED Notes (Signed)
Unit sec notified to arrange transport for pt to the floor.

## 2023-06-27 NOTE — H&P (View-Only) (Signed)
Hospital Consult    Reason for Consult:  Dialysis Vascular Access difficulties.  Requesting Physician:  Dr Sharman Cheek MD MRN #:  [id number]  History of Present Illness: This is a 57 y.o. male with a history of COPD, diabetes hypertension CHF ESRD on hemodialysis Tuesday Thursday Saturday, subarachnoid hemorrhage who comes ED complaining of trouble with his dialysis access. He reports his last dialysis session was a week ago. Denies acute chest pain or shortness of breath. No palpitations. He states he went to dialysis this morning and they attempted to access his AV Fistula but were unable to initiate dialysis. He was told to come to the emergency department today. Vascular Surgery consulted to evaluate.   On exam in the ER patient rest comfortably on the stretcher. You could visibly see where someone attempted to access his dialysis catheter recently. Left arm was tender to touch with palpable pulses. Patient has no other complaints at this time. Vitals all remain stable.   Past Medical History:  Diagnosis Date   Abdominal abscess    a.) chronic MRSA infection   Acute pancreatitis    Anxiety    Asthma    Brain aneurysm    a.) 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 b.) RIGHT crainotomy for clip ligation 05/14/2019.   CHF (congestive heart failure) (HCC)    Chronic back pain    Chronic heart failure with preserved ejection fraction (HFpEF) (HCC)    a. 11/2021 Echo: EF 55-60%, no rwma, mod LVH, GrI DD. Nl RV size/fxn. Mildly dil LA.   CKD (chronic kidney disease), stage V (HCC)    Depression    Emphysema of lung (HCC)    Erectile dysfunction    Followed by palliative care service    GERD (gastroesophageal reflux disease)    History of methicillin resistant staphylococcus aureus (MRSA)    HLD (hyperlipidemia)    Hypertension    Mild cognitive impairment    MRSA (methicillin resistant Staphylococcus aureus)     Obesity    OSA (obstructive sleep apnea) 2013   a.) not compliant with nocturnal PAP therapy; CPAP machine "lost or stolen".   Panic disorder    Perforated bowel (HCC) 12/10/2005   Tempoary Colostomy Bag, Skin Graft for Abd wound   Polysubstance abuse (HCC)    a.) ETOH, tobacco, marijuana, methamphetamines, cocaine, BZO, opioids.   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   T2DM (type 2 diabetes mellitus) (HCC)    Tobacco abuse    Type 2 diabetes mellitus without complication, without long-term current use of insulin (HCC) 04/17/2016   Vitreous hemorrhage (HCC) 03/27/2011   Overview:  Bilateral; 01/2010, [facility]     Past Surgical History:  Procedure Laterality Date   A/V FISTULAGRAM Left 07/16/2022   Procedure: A/V Fistulagram;  Surgeon: Annice Needy, MD;  Location: [facility] INVASIVE CV LAB;  Service: Cardiovascular;  Laterality: Left;   AV FISTULA PLACEMENT Left 05/24/2022   Procedure: ARTERIOVENOUS (AV) FISTULA CREATION ( RADIAL CEPHALIC);  Surgeon: Annice Needy, MD;  Location: [facility] ORS;  Service: Vascular;  Laterality: Left;   CEREBRAL ANEURYSM REPAIR Right 01/26/2010   Procedure: CEREBRAL ANEURYSM REPAIR (COIL EMBOLIZATION)   CEREBRAL ANEURYSM REPAIR Right 05/14/2019   Procedure: CRAINOTOMY FOR CEREBRAL ANEURYSM REPAIR (CLIP LIGATION)   COLON SURGERY  12/10/2005   colostomy bag placed s/p perforated bowel  COLONOSCOPY WITH PROPOFOL N/A 05/18/2020   Procedure: COLONOSCOPY WITH PROPOFOL;  Surgeon: Toney Reil, MD;  Location: [facility] ENDOSCOPY;  Service: Gastroenterology;  Laterality: N/A;   DIALYSIS/PERMA CATHETER INSERTION N/A 06/27/2022   Procedure: DIALYSIS/PERMA CATHETER INSERTION;  Surgeon: Renford Dills, MD;  Location: [facility] INVASIVE CV LAB;  Service: Cardiovascular;  Laterality: N/A;   DIALYSIS/PERMA CATHETER REMOVAL N/A 05/20/2023   Procedure:  DIALYSIS/PERMA CATHETER REMOVAL;  Surgeon: Annice Needy, MD;  Location: [facility] INVASIVE CV LAB;  Service: Cardiovascular;  Laterality: N/A;   KNEE SURGERY Right    Perforated bowel      Allergies  Allergen Reactions   Atorvastatin     Joint Aches - Severe Joint Aches - Severe Joint Aches - Severe   Avelox [Moxifloxacin Hcl In Nacl]     Muscle pain   Buprenorphine     Mouth sores, confusion, shaking   Dilaudid [Hydromorphone Hcl] Hives   Fluoxetine Itching   Levofloxacin Other (See Comments)    Joint Pain   Morphine And Codeine    Other     Muscle pain   Suboxone [Buprenorphine Hcl-Naloxone Hcl] Other (See Comments)    Rash and confused   Vancomycin     Renal insufficiency    Prior to Admission medications   Medication Sig Start Date End Date Taking? Authorizing Provider  albuterol (VENTOLIN HFA) 108 (90 Base) MCG/ACT inhaler Inhale 1-2 puffs into the lungs every 6 (six) hours as needed for wheezing or shortness of breath. 06/10/23   Mecum, Erin E, PA-C  carvedilol (COREG) 25 MG tablet Take 25 mg by mouth 2 (two) times daily with a meal.    [provider]  DULoxetine (CYMBALTA) 60 MG capsule Take 1 capsule (60 mg total) by mouth daily. 11/24/22 06/19/23  Pokhrel, Rebekah Chesterfield, MD  glucose blood (ONETOUCH ULTRA) test strip USE TO TEST BLOOD SUGAR DAILY 08/23/20   Cathlean Marseilles A, NP  hydrALAZINE (APRESOLINE) 100 MG tablet Take 100 mg by mouth 2 (two) times daily.    [provider]  lidocaine-prilocaine (EMLA) cream Apply 1 Application topically daily. 08/23/22   [provider]  losartan (COZAAR) 25 MG tablet Take 1 tablet (25 mg total) by mouth daily. 06/19/23   Cannady, Corrie Dandy T, NP  OXYGEN Inhale 3 L into the lungs daily.    [provider]  pantoprazole (PROTONIX) 40 MG tablet TAKE 1 TABLET(40 MG) BY MOUTH TWICE DAILY AS NEEDED 01/23/23   Larae Grooms, NP  topiramate (TOPAMAX) 50 MG tablet TAKE 1 TABLET(50 MG) BY MOUTH TWICE DAILY 01/07/23    Larae Grooms, NP  torsemide (DEMADEX) 5 MG tablet Take 1 tablet (5 mg total) by mouth daily. Hold this on dialysis days. 06/19/23   Cannady, Jolene T, NP  TRELEGY ELLIPTA 100-62.5-25 MCG/ACT AEPB Inhale 1 puff into the lungs daily. 06/10/23   Mecum, Oswaldo Conroy, PA-C    Social History   Socioeconomic History   Marital status: Divorced    Spouse name: Not on file   Number of children: Not on file   Years of education: Not on file   Highest education level: Not on file  Occupational History   Not on file  Tobacco Use   Smoking status: Every Day    Current packs/day: 1.00    Average packs/day: 1 pack/day for 40.0 years (40.0 ttl pk-yrs)    Types: Cigarettes    Start date: 10/11/1984   Smokeless tobacco: Never  Vaping Use   Vaping status: Never Used  Substance and Sexual Activity   Alcohol use: Yes   Drug use: Not Currently    Types: "Crack" cocaine, Amphetamines, Methamphetamines, Benzodiazepines, Cocaine, Marijuana, Other-see comments    Comment: opioids   Sexual activity: Not Currently  Other Topics Concern   Not on file  Social History Narrative   Lives alone and has nursing assistance help that lives in.   Social Determinants of Health   Financial Resource Strain: Low Risk  (06/25/2023)   Overall Financial Resource Strain (CARDIA)    Difficulty of Paying Living Expenses: Not hard at all  Food Insecurity: No Food Insecurity (06/25/2023)   Hunger Vital Sign    Worried About Running Out of Food in the Last Year: Never true    Ran Out of Food in the Last Year: Never true  Transportation Needs: No Transportation Needs (06/25/2023)   PRAPARE - Administrator, Civil Service (Medical): No    Lack of Transportation (Non-Medical): No  Physical Activity: Inactive (06/25/2023)   Exercise Vital Sign    Days of Exercise per Week: 0 days    Minutes of Exercise per Session: 0 min  Stress: No Stress Concern Present (06/25/2023)   [employer] of Occupational Health -  Occupational Stress Questionnaire    Feeling of Stress : Only a little  Social Connections: Socially Isolated (06/25/2023)   Social Connection and Isolation Panel [NHANES]    Frequency of Communication with Friends and Family: Never    Frequency of Social Gatherings with Friends and Family: Never    Attends Religious Services: Never    Database administrator or Organizations: No    Attends Banker Meetings: Never    Marital Status: Divorced  Catering manager Violence: Not At Risk (06/25/2023)   Humiliation, Afraid, Rape, and Kick questionnaire    Fear of Current or Ex-Partner: No    Emotionally Abused: No    Physically Abused: No    Sexually Abused: No     Family History  Problem Relation Age of Onset   Arthritis Mother    Asthma Mother    Diabetes Mother    Heart disease Mother    Hyperlipidemia Mother    Hypertension Mother    Kidney disease Mother    Thyroid disease Mother    Lung disease Mother    Anxiety disorder Mother    Depression Mother    Diabetes Father    Heart disease Father    Depression Father    Anxiety disorder Father    Arthritis Sister    Asthma Sister    Hyperlipidemia Sister    Hypertension Sister    Lung disease Sister    Anxiety disorder Sister    Depression Sister    Hyperlipidemia Brother    Hypertension Brother    Diabetes Sister    Heart disease Sister    Depression Sister    Anxiety disorder Sister    Anxiety disorder Brother    Depression Brother    Heart disease Brother     ROS: Otherwise negative unless mentioned in HPI  Physical Examination  Vitals:   06/27/23 1255 06/27/23 1530  BP: (!) 181/125 (!) 216/115  Pulse: 84 81  Resp: 20 (!) 24  Temp: 98.3 F (36.8 C) 97.9 F (36.6 C)  SpO2: 99% 98%   Body mass index is 32.46 kg/m.  General:  WDWN in NAD Gait: Not observed HENT: WNL, normocephalic Pulmonary: normal non-labored breathing, without Rales, rhonchi,  wheezing Cardiac: regular, without  Murmurs,  rubs or gallops; without carotid bruits Abdomen: Positive bowel Sounds,  soft, NT/ND, no masses Skin: without rashes Vascular Exam/Pulses: Palpable pulses in left AV arm fistula site.  Extremities: without ischemic changes, without Gangrene , without cellulitis; without open wounds;  Musculoskeletal: no muscle wasting or atrophy  Neurologic: A&O X 3;  No focal weakness or paresthesias are detected; speech is fluent/normal Psychiatric:  The pt has Normal affect. Lymph:  Unremarkable  CBC    Component Value Date/Time   WBC 8.8 06/27/2023 1258   RBC 3.56 (L) 06/27/2023 1258   HGB 11.5 (L) 06/27/2023 1258   HGB 11.2 (L) 06/10/2023 1431   HCT 33.2 (L) 06/27/2023 1258   HCT 33.7 (L) 06/10/2023 1431   PLT 309 06/27/2023 1258   PLT 297 06/10/2023 1431   MCV 93.3 06/27/2023 1258   MCV 93 06/10/2023 1431   MCV 87 08/12/2014 1019   MCH 32.3 06/27/2023 1258   MCHC 34.6 06/27/2023 1258   RDW 15.1 06/27/2023 1258   RDW 16.2 (H) 06/10/2023 1431   RDW 12.7 08/12/2014 1019   LYMPHSABS 1.2 06/11/2023 1530   LYMPHSABS 1.7 06/10/2023 1431   LYMPHSABS 2.6 08/12/2014 1019   MONOABS 0.5 06/11/2023 1530   MONOABS 0.6 08/12/2014 1019   EOSABS 0.2 06/11/2023 1530   EOSABS 0.3 06/10/2023 1431   EOSABS 0.4 08/12/2014 1019   BASOSABS 0.1 06/11/2023 1530   BASOSABS 0.1 06/10/2023 1431   BASOSABS 0.0 08/12/2014 1019    BMET    Component Value Date/Time   NA 138 06/27/2023 1258   NA 139 06/10/2023 1431   NA 139 08/12/2014 1019   K 3.8 06/27/2023 1258   K 3.9 08/12/2014 1019   CL 105 06/27/2023 1258   CL 103 08/12/2014 1019   CO2 23 06/27/2023 1258   CO2 27 08/12/2014 1019   GLUCOSE 115 (H) 06/27/2023 1258   GLUCOSE 100 (H) 08/12/2014 1019   BUN 41 (H) 06/27/2023 1258   BUN 37 (H) 06/10/2023 1431   BUN 17 08/12/2014 1019   CREATININE 6.01 (H) 06/27/2023 1258   CREATININE 1.55 (H) 08/12/2014 1019   CALCIUM 8.3 (L) 06/27/2023 1258   CALCIUM 9.1 08/12/2014 1019   GFRNONAA 10 (L) 06/27/2023  1258   GFRNONAA 52 (L) 08/12/2014 1019   GFRAA 65 06/29/2020 1033   GFRAA >60 08/12/2014 1019    COAGS: No results found for: "INR", "PROTIME"   Non-Invasive Vascular Imaging:   NONE  Statin:  No. Beta Blocker:  Yes.   Aspirin:  No. ACEI:  No. ARB:  Yes.   CCB use:  No Other antiplatelets/anticoagulants:  No.    ASSESSMENT/PLAN: This is a 57 y.o. male who presents to [facility] ER with dialysis access difficulties.   PLAN: Vascular Surgery plans on taking the patient to the vascular lab tomorrow 06/28/2023 for a left arm AV fistulagram. I discussed in detail the procedure, benefits, risks and complications. He verbalizes his understanding and states he has had this procedure done before. I answered all his questions today. Patient will be made NPO after midnight tonight for the procedure tomorrow.    -I discussed the plan in detail with Dr Vilinda Flake MD and he agrees with the plan.    Marcie [facility] 06/27/2023 4:26 PM

## 2023-06-27 NOTE — Assessment & Plan Note (Signed)
Patient is presenting with over 1 week of missed dialysis due to vascular access issue.  - Vascular surgery consulted; appreciate their recommendations - Intervention planned for tomorrow - N.p.o. after midnight - Oxycodone as needed for pain surrounding access site

## 2023-06-27 NOTE — Assessment & Plan Note (Signed)
Shortness of breath reported at this time is likely due to edema, rather than COPD.  Cough is unchanged.  - DuoNebs as needed - Continue home bronchodilators

## 2023-06-27 NOTE — ED Triage Notes (Signed)
Pt to ED for vascular access problem. States was at dialysis but were unable to do treatment, unsure what problem was other than "it wouldn't go" Reports last dialysis a week ago

## 2023-06-27 NOTE — H&P (Signed)
History and Physical    Patient: Derek Cooley. QIO:[id number] DOB: 09-23-1966 DOA: 06/27/2023 DOS: the patient was seen and examined on 06/27/2023 PCP: Larae Grooms, NP  Patient coming from: Home  Chief Complaint:  Chief Complaint  Patient presents with   Vascular Access Problem   HPI: Derek Gatt. is a 57 y.o. 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  [name] states that his last dialysis session was on 7/9.  Since then, every time he is gone to the facility, they have had difficulty with accessing his site.  He notes that he continues to make urine though.  He endorses increased shortness of breath, but is unsure if it is from his COPD or fluid.  He denies any chest pain, abdominal distention, lower extremity swelling.  He denies any increased cough, fever, chills, nausea, vomiting, diarrhea.  He endorses left shoulder pain that he states stems from an injury several years back where he dislocated his shoulder and ruptured his rotator cuff.  He has not seen an orthopedic surgeon.  ED course: On arrival to the ED, patient was hypertensive at 181/125 with increased up to 216/115.  He was saturating at 98% on room air.  He was afebrile at 97.9. Initial workup demonstrates hemoglobin of 11.5, potassium 3.8, glucose of 115, bicarb of 23, BUN 41, creatinine of 6.  Vascular surgery nephrology were consulted.  TRH contacted for admission.  Review of Systems: As mentioned in the history of present illness. All other systems reviewed and are negative.  Past Medical History:  Diagnosis Date   Abdominal abscess    a.) chronic MRSA infection   Acute pancreatitis    Anxiety    Asthma    Brain aneurysm    a.) spontaneous  rupture --> SAH from RIGHT PComm --> coil embolization 01/26/2010 with known remaining neck remnant. b.) RIGHT crainotomy for clip ligation 05/14/2019.   CHF (congestive heart failure) (HCC)    Chronic back pain    Chronic heart failure with preserved ejection fraction (HFpEF) (HCC)    a. 11/2021 Echo: EF 55-60%, no rwma, mod LVH, GrI DD. Nl RV size/fxn. Mildly dil LA.   CKD (chronic kidney disease), stage V (HCC)    Depression    Emphysema of lung (HCC)    Erectile dysfunction    Followed by palliative care service    GERD (gastroesophageal reflux disease)    History of methicillin resistant staphylococcus aureus (MRSA)    HLD (hyperlipidemia)    Hypertension    Mild cognitive impairment    MRSA (methicillin resistant Staphylococcus aureus)    Obesity    OSA (obstructive sleep apnea) 2013   a.) not compliant with nocturnal PAP therapy; CPAP machine "lost or stolen".   Panic disorder    Perforated bowel (HCC) 12/10/2005   Tempoary Colostomy Bag, Skin Graft for Abd wound   Polysubstance abuse (HCC)    a.) ETOH, tobacco, marijuana, methamphetamines, cocaine, BZO, opioids.   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   T2DM (type 2 diabetes mellitus) (HCC)    Tobacco abuse    Type 2 diabetes mellitus without complication, without long-term current use of insulin (HCC) 04/17/2016  Vitreous hemorrhage (HCC) 03/27/2011   Overview:  Bilateral; 01/2010, from [facility]    Past Surgical History:  Procedure Laterality Date   A/V FISTULAGRAM Left 07/16/2022   Procedure: A/V Fistulagram;  Surgeon: Annice Needy, MD;  Location: [facility];  Service: Cardiovascular;  Laterality: Left;   AV FISTULA PLACEMENT Left 05/24/2022   Procedure: ARTERIOVENOUS (AV) FISTULA CREATION ( RADIAL CEPHALIC);  Surgeon: Annice Needy, MD;  Location: [facility] ORS;  Service: Vascular;  Laterality: Left;    CEREBRAL ANEURYSM REPAIR Right 01/26/2010   Procedure: CEREBRAL ANEURYSM REPAIR (COIL EMBOLIZATION)   CEREBRAL ANEURYSM REPAIR Right 05/14/2019   Procedure: CRAINOTOMY FOR CEREBRAL ANEURYSM REPAIR (CLIP LIGATION)   COLON SURGERY  12/10/2005   colostomy bag placed s/p perforated bowel   COLONOSCOPY WITH PROPOFOL N/A 05/18/2020   Procedure: COLONOSCOPY WITH PROPOFOL;  Surgeon: Toney Reil, MD;  Location: [facility] ENDOS[facility];  Service: Gastroenterology;  Laterality: N/A;   DIALYSIS/PERMA CATHETER INSERTION N/A 06/27/2022   Procedure: DIALYSIS/PERMA CATHETER INSERTION;  Surgeon: Renford Dills, MD;  Location: [facility];  Service: Cardiovascular;  Laterality: N/A;   DIALYSIS/PERMA CATHETER REMOVAL N/A 05/20/2023   Procedure: DIALYSIS/PERMA CATHETER REMOVAL;  Surgeon: Annice Needy, MD;  Location: [facility];  Service: Cardiovascular;  Laterality: N/A;   KNEE SURGERY Right    Perforated bowel     Social History:  reports that he has been smoking cigarettes. He started smoking about 38 years ago. He has a 40 pack-year smoking history. He has never used smokeless tobacco. He reports current alcohol use. He reports that he does not currently use drugs after having used the following drugs: "Crack" cocaine, Amphetamines, Methamphetamines, Benzodiazepines, Cocaine, Marijuana, and Other-see comments.  Allergies  Allergen Reactions   Atorvastatin     Joint Aches - Severe Joint Aches - Severe Joint Aches - Severe   Avelox [Moxifloxacin Hcl In Nacl]     Muscle pain   Buprenorphine     Mouth sores, confusion, shaking   Dilaudid [Hydromorphone Hcl] Hives   Fluoxetine Itching   Levofloxacin Other (See Comments)    Joint Pain   Morphine And Codeine    Other     Muscle pain   Suboxone [Buprenorphine Hcl-Naloxone Hcl] Other (See Comments)    Rash and confused   Vancomycin     Renal insufficiency    Family History  Problem Relation Age of Onset   Arthritis Mother     Asthma Mother    Diabetes Mother    Heart disease Mother    Hyperlipidemia Mother    Hypertension Mother    Kidney disease Mother    Thyroid disease Mother    Lung disease Mother    Anxiety disorder Mother    Depression Mother    Diabetes Father    Heart disease Father    Depression Father    Anxiety disorder Father    Arthritis Sister    Asthma Sister    Hyperlipidemia Sister    Hypertension Sister    Lung disease Sister    Anxiety disorder Sister    Depression Sister    Hyperlipidemia Brother    Hypertension Brother    Diabetes Sister    Heart disease Sister    Depression Sister    Anxiety disorder Sister    Anxiety disorder Brother    Depression Brother    Heart disease Brother     Prior to Admission medications   Medication Sig Start Date End Date Taking?  Authorizing Provider  albuterol (VENTOLIN HFA) 108 (90 Base) MCG/ACT inhaler Inhale 1-2 puffs into the lungs every 6 (six) hours as needed for wheezing or shortness of breath. 06/10/23   Mecum, Erin E, PA-C  carvedilol (COREG) 25 MG tablet Take 25 mg by mouth 2 (two) times daily with a meal.    [provider]  DULoxetine (CYMBALTA) 60 MG capsule Take 1 capsule (60 mg total) by mouth daily. 11/24/22 06/19/23  Pokhrel, Rebekah Chesterfield, MD  glucose blood (ONETOUCH ULTRA) test strip USE TO TEST BLOOD SUGAR DAILY 08/23/20   Cathlean Marseilles A, NP  hydrALAZINE (APRESOLINE) 100 MG tablet Take 100 mg by mouth 2 (two) times daily.    [provider]  lidocaine-prilocaine (EMLA) cream Apply 1 Application topically daily. 08/23/22   [provider]  losartan (COZAAR) 25 MG tablet Take 1 tablet (25 mg total) by mouth daily. 06/19/23   Cannady, Corrie Dandy T, NP  OXYGEN Inhale 3 L into the lungs daily.    [provider]  pantoprazole (PROTONIX) 40 MG tablet TAKE 1 TABLET(40 MG) BY MOUTH TWICE DAILY AS NEEDED 01/23/23   Larae Grooms, NP  topiramate (TOPAMAX) 50 MG tablet TAKE 1 TABLET(50 MG) BY MOUTH TWICE  DAILY 01/07/23   Larae Grooms, NP  torsemide (DEMADEX) 5 MG tablet Take 1 tablet (5 mg total) by mouth daily. Hold this on dialysis days. 06/19/23   Cannady, Jolene T, NP  TRELEGY ELLIPTA 100-62.5-25 MCG/ACT AEPB Inhale 1 puff into the lungs daily. 06/10/23   Mecum, Oswaldo Conroy, PA-C    Physical Exam: Vitals:   06/27/23 1255 06/27/23 1256 06/27/23 1530  BP: (!) 181/125  (!) 216/115  Pulse: 84  81  Resp: 20  (!) 24  Temp: 98.3 F (36.8 C)  97.9 F (36.6 C)  TempSrc:   Oral  SpO2: 99%  98%  Weight:  94 kg   Height:  5\' 7"  (1.702 m)    Physical Exam Vitals and nursing note reviewed.  Constitutional:      General: He is not in acute distress.    Appearance: He is obese. He is not toxic-appearing.  HENT:     Mouth/Throat:     Mouth: Mucous membranes are moist.     Pharynx: Oropharynx is clear.  Eyes:     Conjunctiva/sclera: Conjunctivae normal.     Pupils: Pupils are equal, round, and reactive to light.  Cardiovascular:     Rate and Rhythm: Normal rate and regular rhythm.     Heart sounds: No murmur heard. Pulmonary:     Effort: Pulmonary effort is normal. Tachypnea present. No accessory muscle usage.     Breath sounds: Decreased breath sounds (Bibasilar) and wheezing (Upper lung fields) present.  Musculoskeletal:     Right lower leg: No edema.     Left lower leg: No edema.  Skin:    General: Skin is warm and dry.  Neurological:     Mental Status: He is alert and oriented to person, place, and time. Mental status is at baseline.  Psychiatric:        Mood and Affect: Mood normal.        Behavior: Behavior normal.    Data Reviewed: CBC with WBC of 8.8, hemoglobin 11.5, MCV of 93, platelets of 309 BMP with sodium of 138, potassium 3.8, bicarb 23, glucose 115, BUN 41, creatinine 6, GFR 10  EKG personally reviewed.  Sinus rhythm with rate of 78.  No ST or T wave changes concerning for acute  ischemia.  Left axis deviation.  QTc prolongation.  DG Chest Portable 1 View  Result  Date: 06/27/2023 CLINICAL DATA:  Wheezing. EXAM: PORTABLE CHEST 1 VIEW COMPARISON:  Chest x-ray dated June 11, 2023. FINDINGS: The heart size and mediastinal contours are within normal limits. Chronic central peribronchial thickening, presumably smoking-related. No focal consolidation, pleural effusion, or pneumothorax. The visualized skeletal structures are unremarkable. IMPRESSION: 1.  No active cardiopulmonary disease. Electronically Signed   By: Obie Dredge M.D.   On: 06/27/2023 15:43    There are no new results to review at this time.  Assessment and Plan:  * Problem with dialysis access [facility]) Patient is presenting with over 1 week of missed dialysis due to vascular access issue.  - Vascular surgery consulted; appreciate their recommendations - Intervention planned for tomorrow - N.p.o. after midnight - Oxycodone as needed for pain surrounding access site  ESRD on hemodialysis (HCC) On examination, patient appears to have some pulmonary edema, however limited peripheral edema.  - One-time dose of Lasix 40 mg IV - Nephrology consulted; appreciate their recommendations  Hypertension associated with diabetes (HCC) Significant hypertension noted on presentation, likely due to missed dialysis.  He is asymptomatic.  - Resume home antihypertensives - One-time dose of Lasix  Chronic obstructive pulmonary disease (COPD) (HCC) Shortness of breath reported at this time is likely due to edema, rather than COPD.  Cough is unchanged.  - DuoNebs as needed - Continue home bronchodilators  Chronic heart failure with preserved ejection fraction (HFpEF) (HCC) Patient appears hypervolemic in the setting of missed dialysis.  - Lasix as noted above - Hold home torsemide today  Type II diabetes mellitus with renal manifestations (HCC) - SSI, very sensitive  Anemia in CKD (chronic kidney disease) Hemoglobin is stable at this time with no evidence of bleeding on examination  - Continue to  monitor hemoglobin while admitted  Chronic left shoulder pain Patient endorses chronic left shoulder pain after an injury several years ago, stating he dislocated his shoulder and ruptured his rotator cuff.  - Recommended outpatient follow-up with orthopedic surgery - Lidocaine patch - Tylenol as needed  Advance Care Planning:   Code Status: Full Code   Consults: Nephrology, vascular surgery  Family Communication: No family at bedside  Severity of Illness: The appropriate patient status for this patient is OBSERVATION. Observation status is judged to be reasonable and necessary in order to provide the required intensity of service to ensure the patient's safety. The patient's presenting symptoms, physical exam findings, and initial radiographic and laboratory data in the context of their medical condition is felt to place them at decreased risk for further clinical deterioration. Furthermore, it is anticipated that the patient will be medically stable for discharge from the hospital within 2 midnights of admission.   Author: Verdene Lennert, MD 06/27/2023 4:46 PM  For on call review [email or web address].

## 2023-06-27 NOTE — TOC CM/SW Note (Signed)
Cm received message from Nancy Fetter, DSS of [facility] stated "pt being sent to ED from [facility] due to non compliant with meds, elevated blood pressure. Could not have treatments today." Derek Cooley has guardianship court scheduled for Monday for him. He lives with 2 friends but not taking care of himself. He has TBI and several other medical issues. She stated she needs an FL2 form for him for whenever he is d/c so she can get I'm placed.

## 2023-06-27 NOTE — Assessment & Plan Note (Signed)
Hemoglobin is stable at this time with no evidence of bleeding on examination  - Continue to monitor hemoglobin while admitted

## 2023-06-27 NOTE — Assessment & Plan Note (Signed)
Patient appears hypervolemic in the setting of missed dialysis.  - Lasix as noted above - Hold home torsemide today

## 2023-06-27 NOTE — Assessment & Plan Note (Signed)
-   SSI, very sensitive

## 2023-06-27 NOTE — Progress Notes (Signed)
       CROSS COVER NOTE  NAME: Derek Cooley. MRN: [id number] DOB : October 20, 1966    Concern as stated by nurse / staff   Patient admit for dialysis vascular access issues, missed one week of dialysis. Had dialysis today. History of ESRD, polysubstance abuse, and mild cognitive impairment, AnOx4 but forgetful. Patient complaint of 9/10 chronic shoulder pain, only has oxycodone 5mg  q12hrs PRN. Patient request for pain relief. Day nurse spoke with dayshift doctor about this, she only put in the oxycodone 5mg . Suspect that he was only given oxy 5mg  due to history of polysubstance abuse. Patient appears comfortable in bed, cheery and talkative, despite complaint of 9/10. Another PRN?      Pertinent findings on chart review: History and physical reviewed: Chronic left shoulder pain Patient endorses chronic left shoulder pain after an injury several years ago, stating he dislocated his shoulder and ruptured his rotator cuff.   - Recommended outpatient follow-up with orthopedic surgery - Lidocaine patch - Tylenol as needed  Assessment and  Interventions   Assessment: Chronic shoulder pain  Plan: Oxycodone frequency increased from every 12 as needed to every 6 as needed Continue lidocaine patch X

## 2023-06-27 NOTE — Consult Note (Signed)
Hospital Consult    Reason for Consult:  Dialysis Vascular Access difficulties.  Requesting Physician:  Dr Sharman Cheek MD MRN #:  [id number]  History of Present Illness: This is a 57 y.o. male with a history of COPD, diabetes hypertension CHF ESRD on hemodialysis Tuesday Thursday Saturday, subarachnoid hemorrhage who comes ED complaining of trouble with his dialysis access. He reports his last dialysis session was a week ago. Denies acute chest pain or shortness of breath. No palpitations. He states he went to dialysis this morning and they attempted to access his AV Fistula but were unable to initiate dialysis. He was told to come to the emergency department today. Vascular Surgery consulted to evaluate.   On exam in the ER patient rest comfortably on the stretcher. You could visibly see where someone attempted to access his dialysis catheter recently. Left arm was tender to touch with palpable pulses. Patient has no other complaints at this time. Vitals all remain stable.   Past Medical History:  Diagnosis Date   Abdominal abscess    a.) chronic MRSA infection   Acute pancreatitis    Anxiety    Asthma    Brain aneurysm    a.) 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 b.) RIGHT crainotomy for clip ligation 05/14/2019.   CHF (congestive heart failure) (HCC)    Chronic back pain    Chronic heart failure with preserved ejection fraction (HFpEF) (HCC)    a. 11/2021 Echo: EF 55-60%, no rwma, mod LVH, GrI DD. Nl RV size/fxn. Mildly dil LA.   CKD (chronic kidney disease), stage V (HCC)    Depression    Emphysema of lung (HCC)    Erectile dysfunction    Followed by palliative care service    GERD (gastroesophageal reflux disease)    History of methicillin resistant staphylococcus aureus (MRSA)    HLD (hyperlipidemia)    Hypertension    Mild cognitive impairment    MRSA (methicillin resistant Staphylococcus aureus)     Obesity    OSA (obstructive sleep apnea) 2013   a.) not compliant with nocturnal PAP therapy; CPAP machine "lost or stolen".   Panic disorder    Perforated bowel (HCC) 12/10/2005   Tempoary Colostomy Bag, Skin Graft for Abd wound   Polysubstance abuse (HCC)    a.) ETOH, tobacco, marijuana, methamphetamines, cocaine, BZO, opioids.   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   T2DM (type 2 diabetes mellitus) (HCC)    Tobacco abuse    Type 2 diabetes mellitus without complication, without long-term current use of insulin (HCC) 04/17/2016   Vitreous hemorrhage (HCC) 03/27/2011   Overview:  Bilateral; 01/2010, [facility]     Past Surgical History:  Procedure Laterality Date   A/V FISTULAGRAM Left 07/16/2022   Procedure: A/V Fistulagram;  Surgeon: Annice Needy, MD;  Location: [facility] INVASIVE CV LAB;  Service: Cardiovascular;  Laterality: Left;   AV FISTULA PLACEMENT Left 05/24/2022   Procedure: ARTERIOVENOUS (AV) FISTULA CREATION ( RADIAL CEPHALIC);  Surgeon: Annice Needy, MD;  Location: [facility] ORS;  Service: Vascular;  Laterality: Left;   CEREBRAL ANEURYSM REPAIR Right 01/26/2010   Procedure: CEREBRAL ANEURYSM REPAIR (COIL EMBOLIZATION)   CEREBRAL ANEURYSM REPAIR Right 05/14/2019   Procedure: CRAINOTOMY FOR CEREBRAL ANEURYSM REPAIR (CLIP LIGATION)   COLON SURGERY  12/10/2005   colostomy bag placed s/p perforated bowel  COLONOSCOPY WITH PROPOFOL N/A 05/18/2020   Procedure: COLONOSCOPY WITH PROPOFOL;  Surgeon: Toney Reil, MD;  Location: [facility] ENDOSCOPY;  Service: Gastroenterology;  Laterality: N/A;   DIALYSIS/PERMA CATHETER INSERTION N/A 06/27/2022   Procedure: DIALYSIS/PERMA CATHETER INSERTION;  Surgeon: Renford Dills, MD;  Location: [facility] INVASIVE CV LAB;  Service: Cardiovascular;  Laterality: N/A;   DIALYSIS/PERMA CATHETER REMOVAL N/A 05/20/2023   Procedure:  DIALYSIS/PERMA CATHETER REMOVAL;  Surgeon: Annice Needy, MD;  Location: [facility] INVASIVE CV LAB;  Service: Cardiovascular;  Laterality: N/A;   KNEE SURGERY Right    Perforated bowel      Allergies  Allergen Reactions   Atorvastatin     Joint Aches - Severe Joint Aches - Severe Joint Aches - Severe   Avelox [Moxifloxacin Hcl In Nacl]     Muscle pain   Buprenorphine     Mouth sores, confusion, shaking   Dilaudid [Hydromorphone Hcl] Hives   Fluoxetine Itching   Levofloxacin Other (See Comments)    Joint Pain   Morphine And Codeine    Other     Muscle pain   Suboxone [Buprenorphine Hcl-Naloxone Hcl] Other (See Comments)    Rash and confused   Vancomycin     Renal insufficiency    Prior to Admission medications   Medication Sig Start Date End Date Taking? Authorizing Provider  albuterol (VENTOLIN HFA) 108 (90 Base) MCG/ACT inhaler Inhale 1-2 puffs into the lungs every 6 (six) hours as needed for wheezing or shortness of breath. 06/10/23   Mecum, Erin E, PA-C  carvedilol (COREG) 25 MG tablet Take 25 mg by mouth 2 (two) times daily with a meal.    [provider]  DULoxetine (CYMBALTA) 60 MG capsule Take 1 capsule (60 mg total) by mouth daily. 11/24/22 06/19/23  Pokhrel, Rebekah Chesterfield, MD  glucose blood (ONETOUCH ULTRA) test strip USE TO TEST BLOOD SUGAR DAILY 08/23/20   Cathlean Marseilles A, NP  hydrALAZINE (APRESOLINE) 100 MG tablet Take 100 mg by mouth 2 (two) times daily.    [provider]  lidocaine-prilocaine (EMLA) cream Apply 1 Application topically daily. 08/23/22   [provider]  losartan (COZAAR) 25 MG tablet Take 1 tablet (25 mg total) by mouth daily. 06/19/23   Cannady, Corrie Dandy T, NP  OXYGEN Inhale 3 L into the lungs daily.    [provider]  pantoprazole (PROTONIX) 40 MG tablet TAKE 1 TABLET(40 MG) BY MOUTH TWICE DAILY AS NEEDED 01/23/23   Larae Grooms, NP  topiramate (TOPAMAX) 50 MG tablet TAKE 1 TABLET(50 MG) BY MOUTH TWICE DAILY 01/07/23    Larae Grooms, NP  torsemide (DEMADEX) 5 MG tablet Take 1 tablet (5 mg total) by mouth daily. Hold this on dialysis days. 06/19/23   Cannady, Jolene T, NP  TRELEGY ELLIPTA 100-62.5-25 MCG/ACT AEPB Inhale 1 puff into the lungs daily. 06/10/23   Mecum, Oswaldo Conroy, PA-C    Social History   Socioeconomic History   Marital status: Divorced    Spouse name: Not on file   Number of children: Not on file   Years of education: Not on file   Highest education level: Not on file  Occupational History   Not on file  Tobacco Use   Smoking status: Every Day    Current packs/day: 1.00    Average packs/day: 1 pack/day for 40.0 years (40.0 ttl pk-yrs)    Types: Cigarettes    Start date: 10/11/1984   Smokeless tobacco: Never  Vaping Use   Vaping status: Never Used  Substance and Sexual Activity   Alcohol use: Yes   Drug use: Not Currently    Types: "Crack" cocaine, Amphetamines, Methamphetamines, Benzodiazepines, Cocaine, Marijuana, Other-see comments    Comment: opioids   Sexual activity: Not Currently  Other Topics Concern   Not on file  Social History Narrative   Lives alone and has nursing assistance help that lives in.   Social Determinants of Health   Financial Resource Strain: Low Risk  (06/25/2023)   Overall Financial Resource Strain (CARDIA)    Difficulty of Paying Living Expenses: Not hard at all  Food Insecurity: No Food Insecurity (06/25/2023)   Hunger Vital Sign    Worried About Running Out of Food in the Last Year: Never true    Ran Out of Food in the Last Year: Never true  Transportation Needs: No Transportation Needs (06/25/2023)   PRAPARE - Administrator, Civil Service (Medical): No    Lack of Transportation (Non-Medical): No  Physical Activity: Inactive (06/25/2023)   Exercise Vital Sign    Days of Exercise per Week: 0 days    Minutes of Exercise per Session: 0 min  Stress: No Stress Concern Present (06/25/2023)   [employer] of Occupational Health -  Occupational Stress Questionnaire    Feeling of Stress : Only a little  Social Connections: Socially Isolated (06/25/2023)   Social Connection and Isolation Panel [NHANES]    Frequency of Communication with Friends and Family: Never    Frequency of Social Gatherings with Friends and Family: Never    Attends Religious Services: Never    Database administrator or Organizations: No    Attends Banker Meetings: Never    Marital Status: Divorced  Catering manager Violence: Not At Risk (06/25/2023)   Humiliation, Afraid, Rape, and Kick questionnaire    Fear of Current or Ex-Partner: No    Emotionally Abused: No    Physically Abused: No    Sexually Abused: No     Family History  Problem Relation Age of Onset   Arthritis Mother    Asthma Mother    Diabetes Mother    Heart disease Mother    Hyperlipidemia Mother    Hypertension Mother    Kidney disease Mother    Thyroid disease Mother    Lung disease Mother    Anxiety disorder Mother    Depression Mother    Diabetes Father    Heart disease Father    Depression Father    Anxiety disorder Father    Arthritis Sister    Asthma Sister    Hyperlipidemia Sister    Hypertension Sister    Lung disease Sister    Anxiety disorder Sister    Depression Sister    Hyperlipidemia Brother    Hypertension Brother    Diabetes Sister    Heart disease Sister    Depression Sister    Anxiety disorder Sister    Anxiety disorder Brother    Depression Brother    Heart disease Brother     ROS: Otherwise negative unless mentioned in HPI  Physical Examination  Vitals:   06/27/23 1255 06/27/23 1530  BP: (!) 181/125 (!) 216/115  Pulse: 84 81  Resp: 20 (!) 24  Temp: 98.3 F (36.8 C) 97.9 F (36.6 C)  SpO2: 99% 98%   Body mass index is 32.46 kg/m.  General:  WDWN in NAD Gait: Not observed HENT: WNL, normocephalic Pulmonary: normal non-labored breathing, without Rales, rhonchi,  wheezing Cardiac: regular, without  Murmurs,  rubs or gallops; without carotid bruits Abdomen: Positive bowel Sounds,  soft, NT/ND, no masses Skin: without rashes Vascular Exam/Pulses: Palpable pulses in left AV arm fistula site.  Extremities: without ischemic changes, without Gangrene , without cellulitis; without open wounds;  Musculoskeletal: no muscle wasting or atrophy  Neurologic: A&O X 3;  No focal weakness or paresthesias are detected; speech is fluent/normal Psychiatric:  The pt has Normal affect. Lymph:  Unremarkable  CBC    Component Value Date/Time   WBC 8.8 06/27/2023 1258   RBC 3.56 (L) 06/27/2023 1258   HGB 11.5 (L) 06/27/2023 1258   HGB 11.2 (L) 06/10/2023 1431   HCT 33.2 (L) 06/27/2023 1258   HCT 33.7 (L) 06/10/2023 1431   PLT 309 06/27/2023 1258   PLT 297 06/10/2023 1431   MCV 93.3 06/27/2023 1258   MCV 93 06/10/2023 1431   MCV 87 08/12/2014 1019   MCH 32.3 06/27/2023 1258   MCHC 34.6 06/27/2023 1258   RDW 15.1 06/27/2023 1258   RDW 16.2 (H) 06/10/2023 1431   RDW 12.7 08/12/2014 1019   LYMPHSABS 1.2 06/11/2023 1530   LYMPHSABS 1.7 06/10/2023 1431   LYMPHSABS 2.6 08/12/2014 1019   MONOABS 0.5 06/11/2023 1530   MONOABS 0.6 08/12/2014 1019   EOSABS 0.2 06/11/2023 1530   EOSABS 0.3 06/10/2023 1431   EOSABS 0.4 08/12/2014 1019   BASOSABS 0.1 06/11/2023 1530   BASOSABS 0.1 06/10/2023 1431   BASOSABS 0.0 08/12/2014 1019    BMET    Component Value Date/Time   NA 138 06/27/2023 1258   NA 139 06/10/2023 1431   NA 139 08/12/2014 1019   K 3.8 06/27/2023 1258   K 3.9 08/12/2014 1019   CL 105 06/27/2023 1258   CL 103 08/12/2014 1019   CO2 23 06/27/2023 1258   CO2 27 08/12/2014 1019   GLUCOSE 115 (H) 06/27/2023 1258   GLUCOSE 100 (H) 08/12/2014 1019   BUN 41 (H) 06/27/2023 1258   BUN 37 (H) 06/10/2023 1431   BUN 17 08/12/2014 1019   CREATININE 6.01 (H) 06/27/2023 1258   CREATININE 1.55 (H) 08/12/2014 1019   CALCIUM 8.3 (L) 06/27/2023 1258   CALCIUM 9.1 08/12/2014 1019   GFRNONAA 10 (L) 06/27/2023  1258   GFRNONAA 52 (L) 08/12/2014 1019   GFRAA 65 06/29/2020 1033   GFRAA >60 08/12/2014 1019    COAGS: No results found for: "INR", "PROTIME"   Non-Invasive Vascular Imaging:   NONE  Statin:  No. Beta Blocker:  Yes.   Aspirin:  No. ACEI:  No. ARB:  Yes.   CCB use:  No Other antiplatelets/anticoagulants:  No.    ASSESSMENT/PLAN: This is a 57 y.o. male who presents to [facility] ER with dialysis access difficulties.   PLAN: Vascular Surgery plans on taking the patient to the vascular lab tomorrow 06/28/2023 for a left arm AV fistulagram. I discussed in detail the procedure, benefits, risks and complications. He verbalizes his understanding and states he has had this procedure done before. I answered all his questions today. Patient will be made NPO after midnight tonight for the procedure tomorrow.    -I discussed the plan in detail with Dr Vilinda Flake MD and he agrees with the plan.    Marcie Bal [facility] 06/27/2023 4:26 PM

## 2023-06-27 NOTE — Assessment & Plan Note (Signed)
On examination, patient appears to have some pulmonary edema, however limited peripheral edema.  - One-time dose of Lasix 40 mg IV - Nephrology consulted; appreciate their recommendations

## 2023-06-27 NOTE — Assessment & Plan Note (Signed)
Patient endorses chronic left shoulder pain after an injury several years ago, stating he dislocated his shoulder and ruptured his rotator cuff.  - Recommended outpatient follow-up with orthopedic surgery - Lidocaine patch - Tylenol as needed

## 2023-06-27 NOTE — ED Provider Notes (Addendum)
[facility] Provider Note    Event Date/Time   First MD Initiated Contact with Patient 06/27/23 1421     (approximate)   History   Chief Complaint: Vascular Access Problem   HPI  Derek Cooley. is a 57 y.o. male with a history of COPD, diabetes hypertension CHF ESRD on hemodialysis Tuesday Thursday Saturday, subarachnoid hemorrhage who comes ED complaining of trouble with his dialysis access.  He reports his last dialysis session was a week ago.  Not sure how his current weight compares to dry weight.  Denies acute chest pain or shortness of breath.  No palpitations  Reportedly he went to dialysis this morning, and they cannulated his AV fistula but were unable to initiate dialysis and sent him to the ED due to access dysfunction     Physical Exam   Triage Vital Signs: ED Triage Vitals  Encounter Vitals Group     BP 06/27/23 1255 (!) 181/125     Systolic BP Percentile --      Diastolic BP Percentile --      Pulse Rate 06/27/23 1255 84     Resp 06/27/23 1255 20     Temp 06/27/23 1255 98.3 F (36.8 C)     Temp src --      SpO2 06/27/23 1255 99 %     Weight 06/27/23 1256 207 lb 3.7 oz (94 kg)     Height 06/27/23 1256 5\' 7"  (1.702 m)     Head Circumference --      Peak Flow --      Pain Score 06/27/23 1256 3     Pain Loc --      Pain Education --      Exclude from Growth Chart --     Most recent vital signs: Vitals:   06/27/23 1255  BP: (!) 181/125  Pulse: 84  Resp: 20  Temp: 98.3 F (36.8 C)  SpO2: 99%    General: Awake, no distress.  CV:  Good peripheral perfusion.  Regular rate and rhythm Resp:  Normal effort.  Not in distress.  Diffuse expiratory wheezing Abd:  No distention.  Soft nontender Other:  Left forearm AV fistula with good thrill.  No inflammatory changes.  No bleeding   ED Results / Procedures / Treatments   Labs (all labs ordered are listed, but only abnormal results are displayed) Labs Reviewed  CBC -  Abnormal; Notable for the following components:      Result Value   RBC 3.56 (*)    Hemoglobin 11.5 (*)    HCT 33.2 (*)    All other components within normal limits  BASIC METABOLIC PANEL - Abnormal; Notable for the following components:   Glucose, Bld 115 (*)    BUN 41 (*)    Creatinine, Ser 6.01 (*)    Calcium 8.3 (*)    GFR, Estimated 10 (*)    All other components within normal limits     EKG EKG interpreted by me, normal sinus rhythm rate of 78.  Normal axis and intervals.  Poor R wave percussion.  Normal ST segments and T waves.   RADIOLOGY    PROCEDURES:  Procedures   MEDICATIONS ORDERED IN ED: Medications - No data to display   IMPRESSION / MDM / ASSESSMENT AND PLAN / ED COURSE  I reviewed the triage vital signs and the nursing notes.  DDx: Electrolyte abnormality, anemia, dialysis catheter dysfunction  Patient's presentation is most consistent with acute  presentation with potential threat to life or bodily function.  Patient comes to the ED due to inability to get dialysis today.  Last dialysis session was a week ago.  No symptoms of acute complication.  Labs are unremarkable, normal potassium level.  Contacted vascular surgery who will plan on angiogram tomorrow.  Will discuss with hospitalist.       FINAL CLINICAL IMPRESSION(S) / ED DIAGNOSES   Final diagnoses:  ESRD on hemodialysis (HCC)  Problem with dialysis access, initial encounter [facility])     Rx / DC Orders   ED Discharge Orders     None        Note:  This document was prepared using Dragon voice recognition software and may include unintentional dictation errors.   Sharman Cheek, MD 06/27/23 1520    Sharman Cheek, MD 06/27/23 [id number]

## 2023-06-28 ENCOUNTER — Encounter
Admission: EM | Disposition: A | Discharge status: Home / Self Care | Payer: Self-pay | Source: Home / Self Care | Attending: Obstetrics and Gynecology

## 2023-06-28 DIAGNOSIS — F1510 Other stimulant abuse, uncomplicated: Secondary | ICD-10-CM | POA: Diagnosis not present

## 2023-06-28 DIAGNOSIS — M25512 Pain in left shoulder: Secondary | ICD-10-CM | POA: Diagnosis not present

## 2023-06-28 DIAGNOSIS — F1410 Cocaine abuse, uncomplicated: Secondary | ICD-10-CM | POA: Diagnosis not present

## 2023-06-28 DIAGNOSIS — F17210 Nicotine dependence, cigarettes, uncomplicated: Secondary | ICD-10-CM | POA: Diagnosis not present

## 2023-06-28 DIAGNOSIS — E785 Hyperlipidemia, unspecified: Secondary | ICD-10-CM | POA: Diagnosis not present

## 2023-06-28 DIAGNOSIS — E669 Obesity, unspecified: Secondary | ICD-10-CM | POA: Insufficient documentation

## 2023-06-28 DIAGNOSIS — Y712 Prosthetic and other implants, materials and accessory cardiovascular devices associated with adverse incidents: Secondary | ICD-10-CM | POA: Diagnosis not present

## 2023-06-28 DIAGNOSIS — N186 End stage renal disease: Secondary | ICD-10-CM | POA: Diagnosis not present

## 2023-06-28 DIAGNOSIS — I132 Hypertensive heart and chronic kidney disease with heart failure and with stage 5 chronic kidney disease, or end stage renal disease: Secondary | ICD-10-CM | POA: Diagnosis not present

## 2023-06-28 DIAGNOSIS — G8929 Other chronic pain: Secondary | ICD-10-CM | POA: Diagnosis not present

## 2023-06-28 DIAGNOSIS — Z992 Dependence on renal dialysis: Secondary | ICD-10-CM | POA: Diagnosis not present

## 2023-06-28 DIAGNOSIS — Z841 Family history of disorders of kidney and ureter: Secondary | ICD-10-CM | POA: Diagnosis not present

## 2023-06-28 DIAGNOSIS — T82898A Other specified complication of vascular prosthetic devices, implants and grafts, initial encounter: Secondary | ICD-10-CM | POA: Diagnosis not present

## 2023-06-28 DIAGNOSIS — I5032 Chronic diastolic (congestive) heart failure: Secondary | ICD-10-CM | POA: Diagnosis not present

## 2023-06-28 DIAGNOSIS — D631 Anemia in chronic kidney disease: Secondary | ICD-10-CM | POA: Diagnosis not present

## 2023-06-28 DIAGNOSIS — E1122 Type 2 diabetes mellitus with diabetic chronic kidney disease: Secondary | ICD-10-CM | POA: Diagnosis not present

## 2023-06-28 DIAGNOSIS — J4489 Other specified chronic obstructive pulmonary disease: Secondary | ICD-10-CM | POA: Diagnosis not present

## 2023-06-28 DIAGNOSIS — Z825 Family history of asthma and other chronic lower respiratory diseases: Secondary | ICD-10-CM | POA: Diagnosis not present

## 2023-06-28 DIAGNOSIS — J9611 Chronic respiratory failure with hypoxia: Secondary | ICD-10-CM | POA: Diagnosis not present

## 2023-06-28 DIAGNOSIS — T8241XA Breakdown (mechanical) of vascular dialysis catheter, initial encounter: Secondary | ICD-10-CM | POA: Diagnosis not present

## 2023-06-28 DIAGNOSIS — Z9981 Dependence on supplemental oxygen: Secondary | ICD-10-CM | POA: Diagnosis not present

## 2023-06-28 DIAGNOSIS — Z833 Family history of diabetes mellitus: Secondary | ICD-10-CM | POA: Diagnosis not present

## 2023-06-28 DIAGNOSIS — F32A Depression, unspecified: Secondary | ICD-10-CM | POA: Diagnosis not present

## 2023-06-28 DIAGNOSIS — Z8249 Family history of ischemic heart disease and other diseases of the circulatory system: Secondary | ICD-10-CM | POA: Diagnosis not present

## 2023-06-28 DIAGNOSIS — J439 Emphysema, unspecified: Secondary | ICD-10-CM | POA: Diagnosis not present

## 2023-06-28 DIAGNOSIS — G4733 Obstructive sleep apnea (adult) (pediatric): Secondary | ICD-10-CM | POA: Diagnosis not present

## 2023-06-28 HISTORY — PX: DIALYSIS/PERMA CATHETER INSERTION: CATH118288

## 2023-06-28 LAB — BASIC METABOLIC PANEL
Anion gap: 10 (ref 5–15)
BUN: 50 mg/dL — ABNORMAL HIGH (ref 6–20)
CO2: 23 mmol/L (ref 22–32)
Calcium: 8.2 mg/dL — ABNORMAL LOW (ref 8.9–10.3)
Chloride: 104 mmol/L (ref 98–111)
Creatinine, Ser: 6 mg/dL — ABNORMAL HIGH (ref 0.61–1.24)
GFR, Estimated: 10 mL/min — ABNORMAL LOW (ref >60)
Glucose, Bld: 93 mg/dL (ref 70–99)
Potassium: 3.9 mmol/L (ref 3.5–5.1)
Sodium: 137 mmol/L (ref 135–145)

## 2023-06-28 LAB — CBC
HCT: 33.6 % — ABNORMAL LOW (ref 39.0–52.0)
Hemoglobin: 11.2 g/dL — ABNORMAL LOW (ref 13.0–17.0)
MCH: 31.9 pg (ref 26.0–34.0)
MCHC: 33.3 g/dL (ref 30.0–36.0)
MCV: 95.7 fL (ref 80.0–100.0)
Platelets: 311 10*3/uL (ref 150–400)
RBC: 3.51 MIL/uL — ABNORMAL LOW (ref 4.22–5.81)
RDW: 15.4 % (ref 11.5–15.5)
WBC: 8.6 10*3/uL (ref 4.0–10.5)
nRBC: 0 % (ref 0.0–0.2)

## 2023-06-28 LAB — HEPATITIS B SURFACE ANTIGEN: Hepatitis B Surface Ag: NONREACTIVE

## 2023-06-28 LAB — HIV ANTIBODY (ROUTINE TESTING W REFLEX): HIV Screen 4th Generation wRfx: NONREACTIVE

## 2023-06-28 SURGERY — DIALYSIS/PERMA CATHETER INSERTION
Anesthesia: Moderate Sedation

## 2023-06-28 MED ORDER — HEPARIN SODIUM (PORCINE) 1000 UNIT/ML DIALYSIS: 1000.0000 [IU] | INTRAMUSCULAR | Status: DC | PRN

## 2023-06-28 MED ORDER — ANTICOAGULANT SODIUM CITRATE 4% (200MG/5ML) IV SOLN: 5.0000 mL | Status: DC | PRN

## 2023-06-28 MED ORDER — SODIUM CHLORIDE 0.9 % IV SOLN: INTRAVENOUS | Status: DC

## 2023-06-28 MED ORDER — IPRATROPIUM-ALBUTEROL 0.5-2.5 (3) MG/3ML IN SOLN
RESPIRATORY_TRACT | Status: AC
  Filled 2023-06-28: qty 3

## 2023-06-28 MED ORDER — FENTANYL CITRATE (PF) 100 MCG/2ML IJ SOLN
INTRAMUSCULAR | Status: DC | PRN
  Administered 2023-06-28: 50 ug via INTRAVENOUS

## 2023-06-28 MED ORDER — LABETALOL HCL 5 MG/ML IV SOLN: 20.0000 mg | INTRAVENOUS | Status: DC | PRN

## 2023-06-28 MED ORDER — CHLORHEXIDINE GLUCONATE CLOTH 2 % EX PADS
6.0000 | MEDICATED_PAD | Freq: Every day | CUTANEOUS | Status: DC
  Administered 2023-06-29: 6 via TOPICAL

## 2023-06-28 MED ORDER — LIDOCAINE-PRILOCAINE 2.5-2.5 % EX CREA: 1.0000 | TOPICAL_CREAM | CUTANEOUS | Status: DC | PRN

## 2023-06-28 MED ORDER — LIDOCAINE-EPINEPHRINE (PF) 1 %-1:200000 IJ SOLN
INTRAMUSCULAR | Status: DC | PRN
  Administered 2023-06-28: 20 mL

## 2023-06-28 MED ORDER — FAMOTIDINE 20 MG PO TABS: 40.0000 mg | ORAL_TABLET | Freq: Once | ORAL | Status: DC | PRN

## 2023-06-28 MED ORDER — METHYLPREDNISOLONE SODIUM SUCC 125 MG IJ SOLR: 125.0000 mg | Freq: Once | INTRAMUSCULAR | Status: DC | PRN

## 2023-06-28 MED ORDER — MIDAZOLAM HCL 2 MG/2ML IJ SOLN
INTRAMUSCULAR | Status: AC
  Filled 2023-06-28: qty 2

## 2023-06-28 MED ORDER — CEFAZOLIN SODIUM-DEXTROSE 1-4 GM/50ML-% IV SOLN
INTRAVENOUS | Status: AC
  Filled 2023-06-28: qty 50

## 2023-06-28 MED ORDER — ALTEPLASE 2 MG IJ SOLR: 2.0000 mg | Freq: Once | INTRAMUSCULAR | Status: DC | PRN

## 2023-06-28 MED ORDER — DULOXETINE HCL 30 MG PO CPEP
60.0000 mg | ORAL_CAPSULE | Freq: Every day | ORAL | Status: DC
  Administered 2023-06-28 – 2023-06-29 (×2): 60 mg via ORAL
  Filled 2023-06-28 (×2): qty 2

## 2023-06-28 MED ORDER — CEFAZOLIN SODIUM-DEXTROSE 1-4 GM/50ML-% IV SOLN
1.0000 g | INTRAVENOUS | Status: AC
  Administered 2023-06-28: 1 g via INTRAVENOUS

## 2023-06-28 MED ORDER — ONDANSETRON HCL 4 MG/2ML IJ SOLN: 4.0000 mg | Freq: Four times a day (QID) | INTRAMUSCULAR | Status: DC | PRN

## 2023-06-28 MED ORDER — PENTAFLUOROPROP-TETRAFLUOROETH EX AERO: 1.0000 | INHALATION_SPRAY | CUTANEOUS | Status: DC | PRN

## 2023-06-28 MED ORDER — FENTANYL CITRATE PF 50 MCG/ML IJ SOSY
PREFILLED_SYRINGE | INTRAMUSCULAR | Status: AC
  Filled 2023-06-28: qty 1

## 2023-06-28 MED ORDER — HEPARIN SODIUM (PORCINE) 1000 UNIT/ML IJ SOLN
INTRAMUSCULAR | Status: AC
  Filled 2023-06-28: qty 10

## 2023-06-28 MED ORDER — HEPARIN (PORCINE) IN NACL 1000-0.9 UT/500ML-% IV SOLN
INTRAVENOUS | Status: DC | PRN
  Administered 2023-06-28: 500 mL

## 2023-06-28 MED ORDER — LIDOCAINE HCL (PF) 1 % IJ SOLN: 5.0000 mL | INTRAMUSCULAR | Status: DC | PRN

## 2023-06-28 MED ORDER — MIDAZOLAM HCL 2 MG/2ML IJ SOLN
INTRAMUSCULAR | Status: DC | PRN
  Administered 2023-06-28: 2 mg via INTRAVENOUS

## 2023-06-28 MED ORDER — HEPARIN SODIUM (PORCINE) 5000 UNIT/ML IJ SOLN
5000.0000 [IU] | Freq: Three times a day (TID) | INTRAMUSCULAR | Status: DC
  Administered 2023-06-29: 5000 [IU] via SUBCUTANEOUS
  Filled 2023-06-28 (×2): qty 1

## 2023-06-28 MED ORDER — FENTANYL CITRATE PF 50 MCG/ML IJ SOSY: 12.5000 ug | PREFILLED_SYRINGE | Freq: Once | INTRAMUSCULAR | Status: DC | PRN

## 2023-06-28 MED ORDER — DIPHENHYDRAMINE HCL 50 MG/ML IJ SOLN: 50.0000 mg | Freq: Once | INTRAMUSCULAR | Status: DC | PRN

## 2023-06-28 MED ORDER — MIDAZOLAM HCL 2 MG/ML PO SYRP: 8.0000 mg | ORAL_SOLUTION | Freq: Once | ORAL | Status: DC | PRN

## 2023-06-28 SURGICAL SUPPLY — 14 items
ADH SKN CLS APL DERMABOND .7 (GAUZE/BANDAGES/DRESSINGS) ×1
BIOPATCH RED 1 DISK 7.0 (GAUZE/BANDAGES/DRESSINGS) IMPLANT
CATH PALINDROME-P 19CM W/VT (CATHETERS) IMPLANT
COVER PROBE ULTRASOUND 5X96 (MISCELLANEOUS) IMPLANT
DERMABOND ADVANCED .7 DNX12 (GAUZE/BANDAGES/DRESSINGS) IMPLANT
DRAPE INCISE IOBAN 66X45 STRL (DRAPES) IMPLANT
GOWN STRL REUS W/ TWL LRG LVL3 (GOWN DISPOSABLE) ×1 IMPLANT
GOWN STRL REUS W/TWL LRG LVL3 (GOWN DISPOSABLE) ×1
NDL ENTRY 21GA 7CM ECHOTIP (NEEDLE) IMPLANT
NEEDLE ENTRY 21GA 7CM ECHOTIP (NEEDLE) ×1 IMPLANT
PACK ANGIOGRAPHY (CUSTOM PROCEDURE TRAY) ×1 IMPLANT
SET INTRO CAPELLA COAXIAL (SET/KITS/TRAYS/PACK) IMPLANT
SUT MNCRL AB 4-0 PS2 18 (SUTURE) IMPLANT
SUT SILK 0 FSL (SUTURE) IMPLANT

## 2023-06-28 NOTE — NC FL2 (Addendum)
[place] MEDICAID FL2 LEVEL OF CARE FORM     IDENTIFICATION  Patient Name: Derek Cooley. Birthdate: 1966-03-18 Sex: male Admission Date (Current Location): 06/27/2023  [facility] and [place] Number:  Chiropodist and Address:  [facility], [address] [place], [place] [address]      Provider Number: [id number]  Attending Physician Name and Address:  Kathrynn Running, MD  Relative Name and Phone Number:  Radene Gunning [facility] [phone]    Current Level of Care: Hospital Recommended Level of Care: Assisted Living Facility Prior Approval Number:    Date Approved/Denied:   PASRR Number:    Discharge Plan: Other (Comment) (ALF)    Current Diagnoses: Patient Active Problem List   Diagnosis Date Noted   Obesity (BMI 30-39.9) 06/28/2023   Problem with dialysis access (HCC) 06/27/2023   Chronic left shoulder pain 06/27/2023   ESRD (end stage renal disease) on dialysis (HCC) 03/10/2023   Hypoxia 03/09/2023   SOB (shortness of breath) 03/09/2023   ESRD on hemodialysis (HCC) 09/07/2022   Polysubstance abuse (HCC)    Low back pain 06/29/2022   HLD (hyperlipidemia)    Type II diabetes mellitus with renal manifestations (HCC)    Depression with anxiety    Anemia in CKD (chronic kidney disease)    Chronic heart failure with preserved ejection fraction (HFpEF) (HCC)    Flash pulmonary edema (HCC) 11/25/2021   Cocaine abuse (HCC) 11/25/2021   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   Nonadherence to medication 07/06/2020   Hypertriglyceridemia 07/06/2019   Statin intolerance 01/02/2019   Hx of aneurysm 10/16/2017   Chronic obstructive pulmonary disease (COPD) (HCC) 04/17/2016   GERD (gastroesophageal reflux disease) 08/25/2015   Chronic anxiety 05/16/2015   Persistent headaches 05/16/2015   Tobacco abuse 05/16/2015   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/2013   OSA (obstructive sleep apnea) 06/23/2013    Cerebral aneurysm 06/23/2013   Mild cognitive disorder 03/27/2011   Mild cognitive impairment 03/27/2011   Vitreous hemorrhage (HCC) 03/27/2011   Hypertension associated with diabetes (HCC) 03/06/2011   Morbid obesity (HCC) 03/06/2011    Orientation RESPIRATION BLADDER Height & Weight     Self, Time, Situation, Place  Normal  (Oliguria) Weight: 93.7 kg Height:  5\' 7"  (170.2 cm)  BEHAVIORAL SYMPTOMS/MOOD NEUROLOGICAL BOWEL NUTRITION STATUS  Other (Comment)   Continent  (NPO for procedure)  AMBULATORY STATUS COMMUNICATION OF NEEDS Skin   Limited Assist Verbally Normal, Other (Comment) (Left forearm dialysis catheter)                       Personal Care Assistance Level of Assistance              Functional Limitations Info             SPECIAL CARE FACTORS FREQUENCY     HD [facility] TTS 10:45am                 Contractures Contractures Info: Not present    Additional Factors Info  Code Status, Allergies Code Status Info: FULL Allergies Info: Atorvastatin, Avelox (Moxifloxacin Hcl In Nacl), Buprenorphine, Dilaudid (Hydromorphone Hcl), Fluoxetine, Levofloxacin, Morphine And Codeine, Other, Suboxone (Buprenorphine Hcl-naloxone Hcl), Vancomycin           Current Medications (06/28/2023):  This is the current hospital active medication list Current Facility-Administered Medications  Medication Dose Route Frequency Provider Last Rate Last Admin   acetaminophen (TYLENOL) tablet 650  mg  650 mg Oral Q6H PRN Verdene Lennert, MD       Or   acetaminophen (TYLENOL) suppository 650 mg  650 mg Rectal Q6H PRN Verdene Lennert, MD       carvedilol (COREG) tablet 25 mg  25 mg Oral BID WC Verdene Lennert, MD       fluticasone furoate-vilanterol (BREO ELLIPTA) 100-25 MCG/ACT 1 puff  1 puff Inhalation Daily Verdene Lennert, MD       And   umeclidinium bromide (INCRUSE ELLIPTA) 62.5 MCG/ACT 1 puff  1 puff Inhalation Daily Verdene Lennert, MD       furosemide (LASIX)  injection 40 mg  40 mg Intravenous Once Verdene Lennert, MD       hydrALAZINE (APRESOLINE) tablet 100 mg  100 mg Oral Q8H Lindajo Royal V, MD   100 mg at 06/28/23 0865   ipratropium-albuterol (DUONEB) 0.5-2.5 (3) MG/3ML nebulizer solution 3 mL  3 mL Nebulization Q6H PRN Verdene Lennert, MD       labetalol (NORMODYNE) injection 20 mg  20 mg Intravenous Q2H PRN Wouk, Wilfred Curtis, MD       lidocaine (LIDODERM) 5 % 1 patch  1 patch Transdermal Q24H Verdene Lennert, MD   1 patch at 06/27/23 1852   losartan (COZAAR) tablet 25 mg  25 mg Oral Daily Verdene Lennert, MD       ondansetron (ZOFRAN) tablet 4 mg  4 mg Oral Q6H PRN Verdene Lennert, MD       Or   ondansetron (ZOFRAN) injection 4 mg  4 mg Intravenous Q6H PRN Verdene Lennert, MD       oxyCODONE (Oxy IR/ROXICODONE) immediate release tablet 5 mg  5 mg Oral Q6H PRN Andris Baumann, MD   5 mg at 06/28/23 7846   polyethylene glycol (MIRALAX / GLYCOLAX) packet 17 g  17 g Oral Daily PRN Verdene Lennert, MD       sodium chloride flush (NS) 0.9 % injection 3 mL  3 mL Intravenous Q12H Verdene Lennert, MD   3 mL at 06/28/23 1115   topiramate (TOPAMAX) tablet 50 mg  50 mg Oral BID Verdene Lennert, MD   50 mg at 06/27/23 2139     Discharge Medications: Please see discharge summary for a list of discharge medications.  Relevant Imaging Results:  Relevant Lab Results:   Additional Information SSN# [id number]  Truddie Hidden, RN

## 2023-06-28 NOTE — Op Note (Signed)
[facility]   OPERATIVE NOTE     PROCEDURE: 1. Insertion of a right IJ tunneled dialysis catheter. 2. Catheter placement and cannulation under ultrasound and fluoroscopic guidance  PRE-OPERATIVE DIAGNOSIS: end-stage renal requiring hemodialysis  POST-OPERATIVE DIAGNOSIS: same as above  SURGEON: Levora Dredge  ANESTHESIA: Conscious sedation was administered under my direct supervision by the interventional radiology RN. IV Versed plus fentanyl were utilized. Continuous ECG, pulse oximetry and blood pressure was monitored throughout the entire procedure.  Conscious sedation was for a total of 21 minutes.  ESTIMATED BLOOD LOSS: Minimal  FINDING(S): 1.  Tips of the catheter in the right atrium on fluoroscopy 2.  No obvious pneumothorax on fluoroscopy  SPECIMEN(S):  none  INDICATIONS:   Derek Cooley. is a 57 y.o. male  presents with end stage renal disease.  Therefore, the patient requires a tunneled dialysis catheter placement.  The patient is informed of  the risks catheter placement include but are not limited to: bleeding, infection, central venous injury, pneumothorax, possible venous stenosis, possible malpositioning in the venous system, and possible infections related to long-term catheter presence.  The patient was aware of these risks and agreed to proceed.  DESCRIPTION: The patient was taken back to Special Procedure suite.  Prior to sedation, the patient was given IV antibiotics.  After obtaining adequate sedation, the patient was prepped and draped in the standard fashion for a right IJ tunneled dialysis catheter placement.  Appropriate Time Out is called.     The right neck and chest wall are then infiltrated with 1% Lidocaine with epinepherine.  A 19 cm catheter is then selected, opened on the back table and prepped. Ultrasound is placed in a sterile sleeve.  Under ultrasound guidance, the right IJ vein is examined and is noted to be  echolucent and easily compressible indicating patency.   An image is recorded for the permanent record.  The right IJ vein is cannulated with the microneedle under direct ultrasound vissualization.  A Microwire followed by a micro sheath is then inserted without difficulty.   A J-wire was then advanced under fluoroscopic guidance into the inferior vena cava and the wire was secured.  Small counter incision was then made at the wire insertion site. A small pocket was fashioned with blunt dissection to allow easier passage of the cuff.  The dilator and peel-away sheath are then advanced over the wire under fluoroscopic guidance. The catheters and advanced through the peel-away sheath. It is approximated to the chest wall after verifying the tips at the atrial caval junction and an exit site is selected.  Small incision is made at the selected exit site and the tunneling device was passed subcutaneously to the counter incision. Catheter is then pulled through the subcutaneous tunnel. The catheter is then verified for tip position under fluoroscopy, transected and the hub assembly connected.    Each port was tested by aspirating and flushing.  No resistance was noted.  Each port was then thoroughly flushed with heparinized saline.  The catheter was secured in placed with two interrupted stitches of 0 silk tied to the catheter.  The counter incision was closed with a U-stitch of 4-0 Monocryl.  The insertion site is then cleaned and sterile bandages applied including a Biopatch.  Each port was then packed with concentrated heparin (1000 Units/mL) at the manufacturer recommended volumes to each port.  Sterile caps were applied to each port.  On completion fluoroscopy, the tips of the catheter were in  the right atrium, and there was no evidence of pneumothorax.  COMPLICATIONS: None  CONDITION: Unchanged   Levora Dredge  vein and vascular Office: [phone]   06/28/2023, 3:33 PM

## 2023-06-28 NOTE — Discharge Planning (Signed)
ESTABLISHED HEMODIALYSIS Outpatient Facility [facility]  [address] [place], [place] [address] [phone]  Scheduled Days Tuesday Thursday and Saturday  Treatment Time: 10:45am  Dimas Chyle Dialysis Coordinator II  [facility] Cell: [phone] eFax: [phone] [email or web address]

## 2023-06-28 NOTE — Plan of Care (Signed)

## 2023-06-28 NOTE — TOC Progression Note (Addendum)
Transition of Care (TOC) - Progression Note    Patient Details  Name: Derek Cooley. MRN: [id number] Date of Birth: Feb 04, 1966  Transition of Care [facility]) CM/SW Contact  Truddie Hidden, RN Phone Number: 06/28/2023, 11:33 AM  Clinical Narrative:    Sherron Monday with Nancy Fetter, assigned DSS representative. DSS is seeking guardianship for patient and will present for a hearing 07/01/2023 at 2pm. Ander Slade is requesting a ALF FL2. FL2 emailed to [email or web address]       Expected Discharge Plan and Services                                               Social Determinants of Health (SDOH) Interventions SDOH Screenings   Food Insecurity: Food Insecurity Present (06/27/2023)  Housing: Low Risk  (06/27/2023)  Transportation Needs: No Transportation Needs (06/27/2023)  Utilities: Not At Risk (06/27/2023)  Alcohol Screen: Low Risk  (06/25/2023)  Depression (PHQ2-9): Medium Risk (06/25/2023)  Financial Resource Strain: Low Risk  (06/25/2023)  Physical Activity: Inactive (06/25/2023)  Social Connections: Socially Isolated (06/25/2023)  Stress: No Stress Concern Present (06/25/2023)  Tobacco Use: High Risk (06/27/2023)  Health Literacy: Inadequate Health Literacy (06/25/2023)    Readmission Risk Interventions    06/25/2022    3:34 PM  Readmission Risk Prevention Plan  Transportation Screening Complete  Medication Review (RN Care Manager) Complete  PCP or Specialist appointment within 3-5 days of discharge Complete  SW Recovery Care/Counseling Consult Complete  Palliative Care Screening Not Applicable  Skilled Nursing Facility Not Applicable

## 2023-06-28 NOTE — Progress Notes (Signed)
PROGRESS NOTE    Derek Cooley.  YQM:[id number] DOB: May 29, 1966 DOA: 06/27/2023 PCP: Larae Grooms, NP     Brief Narrative:   Derek Cooley. is a 57 y.o. 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   Derek Cooley states that his last dialysis session was on 7/9.  Since then, every time he is gone to the facility, they have had difficulty with accessing his site.  He notes that he continues to make urine though.  He endorses increased shortness of breath, but is unsure if it is from his COPD or fluid.  He denies any chest pain, abdominal distention, lower extremity swelling.  He denies any increased cough, fever, chills, nausea, vomiting, diarrhea.   He endorses left shoulder pain that he states stems from an injury several years back where he dislocated his shoulder and ruptured his rotator cuff.  He has not seen an orthopedic surgeon.   Assessment & Plan:   Principal Problem:   Problem with dialysis access [facility]) Active Problems:   ESRD on hemodialysis (HCC)   Hypertension associated with diabetes (HCC)   Chronic obstructive pulmonary disease (COPD) (HCC)   Chronic heart failure with preserved ejection fraction (HFpEF) (HCC)   Type II diabetes mellitus with renal manifestations (HCC)   Anemia in CKD (chronic kidney disease)   Chronic left shoulder pain   Tobacco abuse   OSA (obstructive sleep apnea)   Amphetamine abuse (HCC)   Polysubstance abuse (HCC)   Obesity (BMI 30-39.9)  # ESRD Has left arm AV fistula that is malfunctioning, apparently last dialysis was 7/9. No sequela of volume overload, no hyperkalemia, but certainly at risk for that. - for fistulogram today, dialysis when able  # Cognitive impairment Per TOC,  DSS is pursuing guardianship, a hearing is scheduled for 7/22  # COPD # Chronic hypoxic respiratory failure Appears to be at baseline, is on 3 liters at home - home breo/incruse  # History polysubstance abuse Denies recent use. HBV, HCV negative within the past year - will check UDS - f/u hiv, rpr  # OSA Not on cpap  # HFpEF No pulmonary edema on cxr - volume control w/ dialysis  # Chronic pain - home duloxetine  # Migraine - home topamax  # HTN Here bp elevated in setting of missed dialysis - cont home coreg, losartan, hydral  # T2DM Appears to be diet controlled. Glucose wnl here - monitor   DVT prophylaxis: heparin Code Status: full Family Communication: none @ bedside  Level of care: Telemetry Medical Status is: Observation    Consultants:  Vascular, nephrology  Procedures: pending  Antimicrobials:  Surgical ppx    Subjective: No complaints, breathing stable  Objective: Vitals:   06/28/23 0530 06/28/23 0700 06/28/23 1054 06/28/23 1112  BP: (!) 170/91 (!) 178/73 (!) 163/88 (!) 143/84  Pulse: 74 83  76  Resp: 20 20    Temp: 97.8 F (36.6 C) 97.8 F (36.6 C)    TempSrc: Oral Oral    SpO2: 97% 98%  96%  Weight:      Height:        Intake/Output Summary (Last 24 hours) at 06/28/2023 1250 Last data filed at 06/28/2023 1100 Gross per 24 hour  Intake 240 ml  Output 200 ml  Net 40 ml   Filed Weights   06/27/23 1256 06/28/23 0500  Weight: 94 kg 93.7 kg    Examination:  General exam: Appears calm and comfortable  Respiratory system: Clear to auscultation save for faint rales at bases Cardiovascular system: S1 & S2 heard, RRR.  Gastrointestinal system: Abdomen is nondistended, soft and nontender. No organomegaly or masses felt. Normal bowel sounds heard. Central nervous system: Alert and oriented. No focal neurological deficits. Extremities: Symmetric 5 x 5 power. Skin: No rashes, lesions or ulcers Psychiatry: calm    Data  Reviewed: I have personally reviewed following labs and imaging studies  CBC: Recent Labs  Lab 06/27/23 1258 06/28/23 0617  WBC 8.8 8.6  HGB 11.5* 11.2*  HCT 33.2* 33.6*  MCV 93.3 95.7  PLT 309 311   Basic Metabolic Panel: Recent Labs  Lab 06/27/23 1258 06/28/23 0617  NA 138 137  K 3.8 3.9  CL 105 104  CO2 23 23  GLUCOSE 115* 93  BUN 41* 50*  CREATININE 6.01* 6.00*  CALCIUM 8.3* 8.2*   GFR: Estimated Creatinine Clearance: 14.8 mL/min (A) (by C-G formula based on SCr of 6 mg/dL (H)). Liver Function Tests: No results for input(s): "AST", "ALT", "ALKPHOS", "BILITOT", "PROT", "ALBUMIN" in the last 168 hours. No results for input(s): "LIPASE", "AMYLASE" in the last 168 hours. No results for input(s): "AMMONIA" in the last 168 hours. Coagulation Profile: No results for input(s): "INR", "PROTIME" in the last 168 hours. Cardiac Enzymes: No results for input(s): "CKTOTAL", "CKMB", "CKMBINDEX", "TROPONINI" in the last 168 hours. BNP (last 3 results) No results for input(s): "PROBNP" in the last 8760 hours. HbA1C: No results for input(s): "HGBA1C" in the last 72 hours. CBG: No results for input(s): "GLUCAP" in the last 168 hours. Lipid Profile: No results for input(s): "CHOL", "HDL", "LDLCALC", "TRIG", "CHOLHDL", "LDLDIRECT" in the last 72 hours. Thyroid Function Tests: No results for input(s): "TSH", "T4TOTAL", "FREET4", "T3FREE", "THYROIDAB" in the last 72 hours. Anemia Panel: No results for input(s): "VITAMINB12", "FOLATE", "FERRITIN", "TIBC", "IRON", "RETICCTPCT" in the last 72 hours. Urine analysis:    Component Value Date/Time   COLORURINE YELLOW (A) 06/11/2023 1305   APPEARANCEUR HAZY (A) 06/11/2023 1305   APPEARANCEUR Clear 04/17/2021 1544   LABSPEC 1.013 06/11/2023 1305   LABSPEC 1.029 06/26/2012 1231   PHURINE 5.0 06/11/2023 1305   GLUCOSEU NEGATIVE 06/11/2023 1305   GLUCOSEU Negative 06/26/2012 1231   HGBUR NEGATIVE 06/11/2023 1305   BILIRUBINUR NEGATIVE  06/11/2023 1305   BILIRUBINUR Negative 04/17/2021 1544   BILIRUBINUR Negative 06/26/2012 1231   KETONESUR NEGATIVE 06/11/2023 1305   PROTEINUR >=300 (A) 06/11/2023 1305   NITRITE NEGATIVE 06/11/2023 1305   LEUKOCYTESUR NEGATIVE 06/11/2023 1305   LEUKOCYTESUR Negative 06/26/2012 1231   Sepsis Labs: @LABRCNTIP (procalcitonin:4,lacticidven:4)  )No results found for this or any previous visit (from the past 240 hour(s)).       Radiology Studies: DG Chest Portable 1 View  Result Date: 06/27/2023 CLINICAL DATA:  Wheezing. EXAM: PORTABLE CHEST 1 VIEW COMPARISON:  Chest x-ray dated June 11, 2023. FINDINGS: The heart size and mediastinal contours are within normal limits. Chronic central peribronchial thickening, presumably smoking-related. No focal consolidation, pleural effusion, or pneumothorax. The visualized skeletal structures are unremarkable. IMPRESSION: 1.  No active cardiopulmonary disease. Electronically Signed   By: Obie Dredge M.D.   On: 06/27/2023 15:43        Scheduled Meds:  carvedilol  25 mg Oral BID WC   [START ON 06/29/2023] Chlorhexidine Gluconate Cloth  6  each Topical Q0600   fluticasone furoate-vilanterol  1 puff Inhalation Daily   And   umeclidinium bromide  1 puff Inhalation Daily   furosemide  40 mg Intravenous Once   hydrALAZINE  100 mg Oral Q8H   lidocaine  1 patch Transdermal Q24H   losartan  25 mg Oral Daily   sodium chloride flush  3 mL Intravenous Q12H   topiramate  50 mg Oral BID   Continuous Infusions:  sodium chloride     anticoagulant sodium citrate      ceFAZolin (ANCEF) IV       LOS: 0 days     Silvano Bilis, MD [facility]   If 7PM-7AM, please contact night-coverage [email or web address] Password [facility] 06/28/2023, 12:50 PM

## 2023-06-28 NOTE — Interval H&P Note (Signed)
History and Physical Interval Note:  06/28/2023 3:35 PM  Derek Cooley.  has presented today for surgery, with the diagnosis of ESRD.  The various methods of treatment have been discussed with the patient and family. After consideration of risks, benefits and other options for treatment, the patient has consented to  Procedure(s): DIALYSIS/PERMA CATHETER INSERTION (N/A) as a surgical intervention.  The patient's history has been reviewed, patient examined, no change in status, stable for surgery.  I have reviewed the patient's chart and labs.  Questions were answered to the patient's satisfaction.     Levora Dredge

## 2023-06-28 NOTE — Progress Notes (Signed)
[facility]  ROUNDING NOTE   Subjective:   Derek Cooley.  is a 57 year old male with past medical conditions including COPD on home oxygen at 3 L, diabetes, struct of sleep apnea, anemia, hypertension, tobacco use, polysubstance abuse, CHF, end-stage renal disease on hemodialysis. Patient presents to emergency department with HD access issues and inability to dialyze. Patient has been admitted under observation for ESRD on hemodialysis (HCC) [N18.6, Z99.2] Problem with dialysis access [facility]) [T82.898A] Problem with dialysis access, initial encounter [facility]) [W09.811B]  Patient is known to our practice and receives outpatient dialysis treatments at [facility] on a TTS schedule, supervised by Derek Cooley. His last treatment was received on Tuesday. He states they were unable to perform treatment on Thursday. He is unsure of the reason.   Labs are unremarkable for renal patient. Patient remains on baseline oxygen requirement, 3L [place]. According to outpatient records, patient is substantially above his dry weight by 5kg.   We have been consulted to manage dialysis needs    Objective:  Vital signs in last 24 hours:  Temp:  [97.8 F (36.6 C)-98.3 F (36.8 C)] 97.8 F (36.6 C) (07/19 0700) Pulse Rate:  [72-84] 76 (07/19 1112) Resp:  [18-24] 20 (07/19 0700) BP: (123-216)/(73-125) 143/84 (07/19 1112) SpO2:  [96 %-100 %] 96 % (07/19 1112) Weight:  [93.7 kg-94 kg] 93.7 kg (07/19 0500)  Weight change:  Filed Weights   06/27/23 1256 06/28/23 0500  Weight: 94 kg 93.7 kg    Intake/Output: I/O last 3 completed shifts: In: 240 [P.O.:240] Out: -    Intake/Output this shift:  Total I/O In: -  Out: 200 [Urine:200]  Physical Exam: General: NAD  Head: Normocephalic, atraumatic. Moist oral mucosal membranes  Eyes: Anicteric  Lungs:  Clear to auscultation  Heart: Regular rate and rhythm  Abdomen:  Soft, nontender  Extremities:  1+ peripheral edema.  Neurologic:  Nonfocal, moving all four extremities  Skin: No lesions  Access: LT AVF    Basic Metabolic Panel: Recent Labs  Lab 06/27/23 1258 06/28/23 0617  NA 138 137  K 3.8 3.9  CL 105 104  CO2 23 23  GLUCOSE 115* 93  BUN 41* 50*  CREATININE 6.01* 6.00*  CALCIUM 8.3* 8.2*    Liver Function Tests: No results for input(s): "AST", "ALT", "ALKPHOS", "BILITOT", "PROT", "ALBUMIN" in the last 168 hours. No results for input(s): "LIPASE", "AMYLASE" in the last 168 hours. No results for input(s): "AMMONIA" in the last 168 hours.  CBC: Recent Labs  Lab 06/27/23 1258 06/28/23 0617  WBC 8.8 8.6  HGB 11.5* 11.2*  HCT 33.2* 33.6*  MCV 93.3 95.7  PLT 309 311    Cardiac Enzymes: No results for input(s): "CKTOTAL", "CKMB", "CKMBINDEX", "TROPONINI" in the last 168 hours.  BNP: Invalid input(s): "POCBNP"  CBG: No results for input(s): "GLUCAP" in the last 168 hours.  Microbiology: Results for orders placed or performed during the hospital encounter of 06/11/23  Culture, blood (routine x 2)     Status: None   Collection Time: 06/11/23  1:07 PM   Specimen: BLOOD  Result Value Ref Range Status   Specimen Description BLOOD LEFT ANTECUBITAL  Final   Special Requests   Final    BOTTLES DRAWN AEROBIC AND ANAEROBIC Blood Culture results may not be optimal due to an inadequate volume of blood received in culture bottles   Culture   Final    NO GROWTH 5 DAYS Performed at [facility], [address]  [place], [place] [address]    Report Status 06/16/2023 FINAL  Final    Coagulation Studies: No results for input(s): "LABPROT", "INR" in the last 72 hours.  Urinalysis: No results for input(s): "COLORURINE", "LABSPEC", "PHURINE", "GLUCOSEU", "HGBUR", "BILIRUBINUR", "KETONESUR", "PROTEINUR", "UROBILINOGEN", "NITRITE", "LEUKOCYTESUR" in the last 72 hours.  Invalid input(s): "APPERANCEUR"    Imaging: DG Chest Portable 1 View  Result Date: 06/27/2023 CLINICAL DATA:  Wheezing.  EXAM: PORTABLE CHEST 1 VIEW COMPARISON:  Chest x-ray dated June 11, 2023. FINDINGS: The heart size and mediastinal contours are within normal limits. Chronic central peribronchial thickening, presumably smoking-related. No focal consolidation, pleural effusion, or pneumothorax. The visualized skeletal structures are unremarkable. IMPRESSION: 1.  No active cardiopulmonary disease. Electronically Signed   By: Obie Dredge M.D.   On: 06/27/2023 15:43     Medications:    sodium chloride     anticoagulant sodium citrate      ceFAZolin (ANCEF) IV      carvedilol  25 mg Oral BID WC   [START ON 06/29/2023] Chlorhexidine Gluconate Cloth  6 each Topical Q0600   fluticasone furoate-vilanterol  1 puff Inhalation Daily   And   umeclidinium bromide  1 puff Inhalation Daily   furosemide  40 mg Intravenous Once   hydrALAZINE  100 mg Oral Q8H   lidocaine  1 patch Transdermal Q24H   losartan  25 mg Oral Daily   sodium chloride flush  3 mL Intravenous Q12H   topiramate  50 mg Oral BID   acetaminophen **OR** acetaminophen, alteplase, anticoagulant sodium citrate, diphenhydrAMINE, famotidine, fentaNYL (SUBLIMAZE) injection, heparin, ipratropium-albuterol, labetalol, lidocaine (PF), lidocaine-prilocaine, methylPREDNISolone (SOLU-MEDROL) injection, midazolam, ondansetron **OR** ondansetron (ZOFRAN) IV, ondansetron (ZOFRAN) IV, oxyCODONE, pentafluoroprop-tetrafluoroeth, polyethylene glycol  Assessment/ Plan:  [name] is a 57 y.o.  male with past medical conditions including COPD on home oxygen at 3 L, diabetes, struct of sleep apnea, anemia, hypertension, tobacco use, polysubstance abuse, CHF, end-stage renal disease on hemodialysis. Patient presents to emergency department with HD access issues and inability to dialyze. Patient has been admitted under observation for ESRD on hemodialysis (HCC) [N18.6, Z99.2] Problem with dialysis access [facility]) [U04.540J] Problem with dialysis access, initial  encounter [facility]) [W11.914N]   Malfunctioning dialysis access. Scheduled for fistulagram later today.   2. End stage renal disease on hemodialysis. Last treatment completed on Tuesday. Once HD access evaluated, UF only treatment ordered for today. Next treatment scheduled for Saturday to maintain outpatient schedule.   3. Anemia of chronic kidney disease Lab Results  Component Value Date   HGB 11.2 (L) 06/28/2023    Hgb above goal.   4. Secondary Hyperparathyroidism: with outpatient labs: PTH 433, phosphorus 5.2, calcium 7.7 on 06/18/23.   Lab Results  Component Value Date   PTH 135 (H) 12/07/2021   PTH Comment 12/07/2021   CALCIUM 8.2 (L) 06/28/2023   PHOS 4.2 03/10/2023    Bone minerals acceptable. Will continue to monitor.    LOS: 0   7/19/202412:41 PM

## 2023-06-29 LAB — CBC
HCT: 37.5 % — ABNORMAL LOW (ref 39.0–52.0)
HCT: 36.8 % — ABNORMAL LOW (ref 39.0–52.0)
Hemoglobin: 12.2 g/dL — ABNORMAL LOW (ref 13.0–17.0)
Hemoglobin: 11.9 g/dL — ABNORMAL LOW (ref 13.0–17.0)
MCH: 31.9 pg (ref 26.0–34.0)
MCH: 31.9 pg (ref 26.0–34.0)
MCHC: 32.5 g/dL (ref 30.0–36.0)
MCHC: 32.3 g/dL (ref 30.0–36.0)
MCV: 98.2 fL (ref 80.0–100.0)
MCV: 98.7 fL (ref 80.0–100.0)
Platelets: 279 10*3/uL (ref 150–400)
Platelets: 279 10*3/uL (ref 150–400)
RBC: 3.82 MIL/uL — ABNORMAL LOW (ref 4.22–5.81)
RBC: 3.73 MIL/uL — ABNORMAL LOW (ref 4.22–5.81)
RDW: 15.3 % (ref 11.5–15.5)
RDW: 15.3 % (ref 11.5–15.5)
WBC: 8.8 10*3/uL (ref 4.0–10.5)
WBC: 8.4 10*3/uL (ref 4.0–10.5)
nRBC: 0 % (ref 0.0–0.2)
nRBC: 0 % (ref 0.0–0.2)

## 2023-06-29 LAB — BASIC METABOLIC PANEL
Anion gap: 9 (ref 5–15)
BUN: 57 mg/dL — ABNORMAL HIGH (ref 6–20)
CO2: 21 mmol/L — ABNORMAL LOW (ref 22–32)
Calcium: 8.5 mg/dL — ABNORMAL LOW (ref 8.9–10.3)
Chloride: 109 mmol/L (ref 98–111)
Creatinine, Ser: 6.5 mg/dL — ABNORMAL HIGH (ref 0.61–1.24)
GFR, Estimated: 9 mL/min — ABNORMAL LOW (ref >60)
Glucose, Bld: 114 mg/dL — ABNORMAL HIGH (ref 70–99)
Potassium: 4.7 mmol/L (ref 3.5–5.1)
Sodium: 139 mmol/L (ref 135–145)

## 2023-06-29 LAB — HEPATITIS B SURFACE ANTIBODY, QUANTITATIVE: Hep B S AB Quant (Post): <3.5 m[IU]/mL — ABNORMAL LOW

## 2023-06-29 LAB — RPR: RPR Ser Ql: NONREACTIVE

## 2023-06-29 NOTE — Discharge Summary (Signed)
Derek Cooley. ZOX:[id number] DOB: 05/16/1966 DOA: 06/27/2023  PCP: Larae Grooms, NP  Admit date: 06/27/2023 Discharge date: 06/29/2023  Time spent: 35 minutes  Recommendations for Outpatient Follow-up:  Resume tts hemodialysis, ongoing fistula problem-solving     Discharge Diagnoses:  Principal Problem:   Problem with dialysis access [facility]) Active Problems:   ESRD on hemodialysis (HCC)   Hypertension associated with diabetes (HCC)   Chronic obstructive pulmonary disease (COPD) (HCC)   Chronic heart failure with preserved ejection fraction (HFpEF) (HCC)   Type II diabetes mellitus with renal manifestations (HCC)   Anemia in CKD (chronic kidney disease)   Chronic left shoulder pain   Tobacco abuse   OSA (obstructive sleep apnea)   Amphetamine abuse (HCC)   Polysubstance abuse (HCC)   Obesity (BMI 30-39.9)   Discharge Condition: stable  Diet recommendation: renal  Filed Weights   06/29/23 0500 06/29/23 0814 06/29/23 1126  Weight: 98.8 kg 95 kg 93.5 kg    History of present illness:  From admission h and p Derek Cooley. is a 57 y.o. 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   [name] states that his last dialysis session was on 7/9.  Since then, every time he is gone to the facility, they have had difficulty with accessing his site.  He notes that he continues to make urine though.  He endorses increased shortness of breath, but is unsure if it is from his COPD or fluid.  He denies any chest pain, abdominal distention, lower extremity swelling.  He denies any increased cough, fever, chills, nausea, vomiting, diarrhea.   He endorses left shoulder pain that he states stems from an injury several years back where  he dislocated his shoulder and ruptured his rotator cuff.  He has not seen an orthopedic surgeon.  Hospital Course:  Patient presents 10 days missed hemodialysis secondary to difficulty accessing left arm fistula site. No significant volume overload or other complication of missed dialysis noted. Vascular surgery placed a right internal jugular tunneled dialysis catheter on 7/19. Patient received hemodialysis on 7/19 and again on 7/20. The catheter is functional. Nephrology advises resuming tts hemodialysis, stable for discharge. His outpatient team in conjunction with vascular surgery will continue to trouble-shoot fistula access problems.    Procedures: Right internal jugular tunneled dialysis catheter placement   Consultations: Nephrology, vascular surgery  Discharge Exam: Vitals:   06/29/23 1114 06/29/23 1210  BP: (!) 144/82 136/76  Pulse: 67 74  Resp: (!) 21 20  Temp: 97.7 F (36.5 C) 98.8 F (37.1 C)  SpO2: 98% 98%    General: NAD Cardiovascular: RRR Respiratory: CTAB Right internal jugular catheter insertion site c/d/i  Discharge Instructions    Allergies as of 06/29/2023       Reactions   Atorvastatin    Joint Aches - Severe Joint Aches - Severe Joint Aches - Severe   Avelox [moxifloxacin Hcl In Nacl]    Muscle pain   Buprenorphine    Mouth sores, confusion, shaking   Dilaudid [hydromorphone Hcl] Hives   Fluoxetine Itching   Levofloxacin Other (See Comments)   Joint Pain   Morphine And Codeine    Other    Muscle pain   Suboxone [buprenorphine Hcl-naloxone Hcl] Other (See Comments)   Rash and  confused   Vancomycin    Renal insufficiency        Medication List     TAKE these medications    albuterol 108 (90 Base) MCG/ACT inhaler Commonly known as: VENTOLIN HFA Inhale 1-2 puffs into the lungs every 6 (six) hours as needed for wheezing or shortness of breath.   carvedilol 25 MG tablet Commonly known as: COREG Take 25 mg by mouth 2 (two) times  daily with a meal.   DULoxetine 60 MG capsule Commonly known as: CYMBALTA Take 1 capsule (60 mg total) by mouth daily.   hydrALAZINE 100 MG tablet Commonly known as: APRESOLINE Take 100 mg by mouth 2 (two) times daily.   lidocaine-prilocaine cream Commonly known as: EMLA Apply 1 Application topically daily.   losartan 25 MG tablet Commonly known as: COZAAR Take 1 tablet (25 mg total) by mouth daily.   OneTouch Ultra test strip Generic drug: glucose blood USE TO TEST BLOOD SUGAR DAILY   OXYGEN Inhale 3 L into the lungs daily.   pantoprazole 40 MG tablet Commonly known as: PROTONIX TAKE 1 TABLET(40 MG) BY MOUTH TWICE DAILY AS NEEDED   topiramate 50 MG tablet Commonly known as: TOPAMAX TAKE 1 TABLET(50 MG) BY MOUTH TWICE DAILY   torsemide 5 MG tablet Commonly known as: DEMADEX Take 1 tablet (5 mg total) by mouth daily. Hold this on dialysis days.   Trelegy Ellipta 100-62.5-25 MCG/ACT Aepb Generic drug: Fluticasone-Umeclidin-Vilant Inhale 1 puff into the lungs daily.       Allergies  Allergen Reactions   Atorvastatin     Joint Aches - Severe Joint Aches - Severe Joint Aches - Severe   Avelox [Moxifloxacin Hcl In Nacl]     Muscle pain   Buprenorphine     Mouth sores, confusion, shaking   Dilaudid [Hydromorphone Hcl] Hives   Fluoxetine Itching   Levofloxacin Other (See Comments)    Joint Pain   Morphine And Codeine    Other     Muscle pain   Suboxone [Buprenorphine Hcl-Naloxone Hcl] Other (See Comments)    Rash and confused   Vancomycin     Renal insufficiency    Follow-up Information     Your dialysis provider Follow up.   Why: This Tuesday as scheduled        Larae Grooms, NP Follow up.   Specialty: Nurse Practitioner Contact information: [address] [place] [address] [phone]                  The results of significant diagnostics from this hospitalization (including imaging, microbiology, ancillary and  laboratory) are listed below for reference.    Significant Diagnostic Studies: PERIPHERAL VASCULAR CATHETERIZATION  Result Date: 06/28/2023 See surgical note for result.  DG Chest Portable 1 View  Result Date: 06/27/2023 CLINICAL DATA:  Wheezing. EXAM: PORTABLE CHEST 1 VIEW COMPARISON:  Chest x-ray dated June 11, 2023. FINDINGS: The heart size and mediastinal contours are within normal limits. Chronic central peribronchial thickening, presumably smoking-related. No focal consolidation, pleural effusion, or pneumothorax. The visualized skeletal structures are unremarkable. IMPRESSION: 1.  No active cardiopulmonary disease. Electronically Signed   By: Obie Dredge M.D.   On: 06/27/2023 15:43   CT ABDOMEN PELVIS WO CONTRAST  Result Date: 06/11/2023 CLINICAL DATA:  Acute abdominal pain, nonlocalized. EXAM: CT ABDOMEN AND PELVIS WITHOUT CONTRAST TECHNIQUE: Multidetector CT imaging of the abdomen and pelvis was performed following the standard protocol without IV contrast. RADIATION DOSE REDUCTION: This exam was performed according  to the departmental dose-optimization program which includes automated exposure control, adjustment of the mA and/or kV according to patient size and/or use of iterative reconstruction technique. COMPARISON:  CT examination dated March 08, 2023 FINDINGS: Lower chest: No acute abnormality. Hepatobiliary: No focal liver abnormality is seen. No gallstones, gallbladder wall thickening, or biliary dilatation. Pancreas: Unremarkable. No pancreatic ductal dilatation or surrounding inflammatory changes. Spleen: Normal in size without focal abnormality. Adrenals/Urinary Tract: Adrenal glands are unremarkable. Kidneys are normal, without renal calculi, focal lesion, or hydronephrosis. Bladder is unremarkable. Stomach/Bowel: Stomach is within normal limits. Appendix appears normal. No evidence of bowel wall thickening, distention, or inflammatory changes. Vascular/Lymphatic: Aortic  atherosclerosis. No enlarged abdominal or pelvic lymph nodes. Reproductive: Prostate is unremarkable. Other: No abdominal wall hernia or abnormality. No abdominopelvic ascites. Musculoskeletal: Mild degenerate disc disease of the lumbar spine prominent at L5-S1. IMPRESSION: 1. No CT evidence of acute abdominal/pelvic process. 2. Aortic atherosclerosis. 3. Mild degenerate disc disease of the lumbar spine prominent at L5-S1. Aortic Atherosclerosis (ICD10-I70.0). Electronically Signed   By: Larose Hires D.O.   On: 06/11/2023 14:15   CT Head Wo Contrast  Result Date: 06/11/2023 CLINICAL DATA:  Headache, new onset. Patient was feeling lethargic and dizziness. Near-syncope. EXAM: CT HEAD WITHOUT CONTRAST TECHNIQUE: Contiguous axial images were obtained from the base of the skull through the vertex without intravenous contrast. RADIATION DOSE REDUCTION: This exam was performed according to the departmental dose-optimization program which includes automated exposure control, adjustment of the mA and/or kV according to patient size and/or use of iterative reconstruction technique. COMPARISON:  Examination dated September 06, 2022 FINDINGS: Brain: No evidence of acute infarction, hemorrhage, hydrocephalus, extra-axial collection or mass lesion/mass effect. Right temporal lobe encephalomalacia. Lacunar infarct of the right thalamus and right external capsular region. Vascular: Streak artifact from prior right ICA aneurysmal repair. Skull: Right frontal craniotomy changes.  No acute abnormality. Sinuses/Orbits: No acute finding. Other: None. IMPRESSION: 1. No acute intracranial abnormality. 2. Right temporal lobe encephalomalacia, unchanged. 3. Chronic lacunar infarct of the right thalamus and right external capsular region. 4. Postsurgical changes for prior right frontal craniotomy and right ICA aneurysmal repair. Electronically Signed   By: Larose Hires D.O.   On: 06/11/2023 14:12   DG Chest Port 1 View  Result Date:  06/11/2023 CLINICAL DATA:  Questionable sepsis - evaluate for abnormality EXAM: PORTABLE CHEST 1 VIEW COMPARISON:  CXR 03/08/23 FINDINGS: No pleural effusion. No pneumothorax. No focal airspace opacity. Unchanged cardiac and mediastinal contours. There are prominent bilateral interstitial opacities could represent pulmonary venous congestion or atypical infection. No radiographically apparent displaced rib fractures. Visualized upper abdomen is unremarkable. IMPRESSION: Prominent bilateral interstitial opacities could represent pulmonary venous congestion or atypical infection. Electronically Signed   By: Lorenza Cambridge M.D.   On: 06/11/2023 13:40    Microbiology: No results found for this or any previous visit (from the past 240 hour(s)).   Labs: Basic Metabolic Panel: Recent Labs  Lab 06/27/23 1258 06/28/23 0617 06/29/23 0506  NA 138 137 139  K 3.8 3.9 4.7  CL 105 104 109  CO2 23 23 21*  GLUCOSE 115* 93 114*  BUN 41* 50* 57*  CREATININE 6.01* 6.00* 6.50*  CALCIUM 8.3* 8.2* 8.5*   Liver Function Tests: No results for input(s): "AST", "ALT", "ALKPHOS", "BILITOT", "PROT", "ALBUMIN" in the last 168 hours. No results for input(s): "LIPASE", "AMYLASE" in the last 168 hours. No results for input(s): "AMMONIA" in the last 168 hours. CBC: Recent Labs  Lab 06/27/23 1258  06/28/23 0617 06/29/23 0506 06/29/23 0825  WBC 8.8 8.6 8.8 8.4  HGB 11.5* 11.2* 12.2* 11.9*  HCT 33.2* 33.6* 37.5* 36.8*  MCV 93.3 95.7 98.2 98.7  PLT 309 311 279 279   Cardiac Enzymes: No results for input(s): "CKTOTAL", "CKMB", "CKMBINDEX", "TROPONINI" in the last 168 hours. BNP: BNP (last 3 results) Recent Labs    01/05/23 1254 03/08/23 1708 06/11/23 1530  BNP 136.4* 195.0* 270.1*    ProBNP (last 3 results) No results for input(s): "PROBNP" in the last 8760 hours.  CBG: No results for input(s): "GLUCAP" in the last 168 hours.     Signed:  Silvano Bilis MD.  [facility] 06/29/2023, 1:23 PM

## 2023-06-29 NOTE — Progress Notes (Signed)
  Received patient in bed to unit.   Informed consent signed and in chart.    TX duration:2.37     Transported back to floor  Hand-off given to patient's nurse. Pt c/o pain throughout tx.  Pt refusing tylenol   Access used: R HD Catheter  Access issues: none   Total UF removed: 2.2L Medication(s) given: none Post HD VS: 144/82 Post HD weight: 93.5kg     Lynann Beaver  [facility]

## 2023-06-29 NOTE — Progress Notes (Addendum)
Pt has a DC order. Pt is a chronic O2 user between 3-4L. This RN informed the case manager about the O2 needs to be refill. Case manager reached out the O2 supplier and will deliver 02 at home and a tank at bedside. Called sister to update her about the DC.

## 2023-06-29 NOTE — Progress Notes (Addendum)
[facility]  ROUNDING NOTE   Subjective:   Derek Cooley.  is a 57 year old male with past medical conditions including COPD on home oxygen at 3 L, diabetes, struct of sleep apnea, anemia, hypertension, tobacco use, polysubstance abuse, CHF, end-stage renal disease on hemodialysis. Patient presents to emergency department with HD access issues and inability to dialyze. Patient has been admitted under observation for ESRD on hemodialysis (HCC) [N18.6, Z99.2] Problem with dialysis access [facility]) [T82.898A] Problem with dialysis access, initial encounter [facility]) [Q73.419F]  Patient is known to our practice and receives outpatient dialysis treatments at [facility] on a TTS schedule, supervised by Dr Cherylann Ratel.   Patient seen and evaluated during dialysis   HEMODIALYSIS FLOWSHEET:  Blood Flow Rate (mL/min): 400 mL/min Arterial Pressure (mmHg): -200 mmHg Venous Pressure (mmHg): 110 mmHg TMP (mmHg): 0 mmHg Ultrafiltration Rate (mL/min): 0 mL/min Dialysate Flow Rate (mL/min): 300 ml/min Dialysis Fluid Bolus: Normal Saline Bolus Amount (mL): 200 mL (flush dialyzer)  Reports soreness from permcath, refusing PRN Tylenol   Objective:  Vital signs in last 24 hours:  Temp:  [97.8 F (36.6 C)-99.1 F (37.3 C)] 97.8 F (36.6 C) (07/20 0808) Pulse Rate:  [0-115] 75 (07/20 1100) Resp:  [11-22] 20 (07/20 1100) BP: (77-178)/(52-98) 113/70 (07/20 1100) SpO2:  [93 %-100 %] 96 % (07/20 1100) Weight:  [93.7 kg-99.1 kg] 95 kg (07/20 0814)  Weight change: -0.3 kg Filed Weights   06/28/23 2318 06/29/23 0500 06/29/23 0814  Weight: 96 kg 98.8 kg 95 kg    Intake/Output: I/O last 3 completed shifts: In: 240 [P.O.:240] Out: 3250 [Urine:550; Other:2700]   Intake/Output this shift:  No intake/output data recorded.  Physical Exam: General: NAD  Head: Normocephalic, atraumatic. Moist oral mucosal membranes  Eyes: Anicteric  Lungs:  Clear to auscultation  Heart: Regular rate  and rhythm  Abdomen:  Soft, nontender  Extremities:  1+ peripheral edema.  Neurologic: Nonfocal, moving all four extremities  Skin: No lesions  Access: LT AVF, Rt permcath    Basic Metabolic Panel: Recent Labs  Lab 06/27/23 1258 06/28/23 0617 06/29/23 0506  NA 138 137 139  K 3.8 3.9 4.7  CL 105 104 109  CO2 23 23 21*  GLUCOSE 115* 93 114*  BUN 41* 50* 57*  CREATININE 6.01* 6.00* 6.50*  CALCIUM 8.3* 8.2* 8.5*    Liver Function Tests: No results for input(s): "AST", "ALT", "ALKPHOS", "BILITOT", "PROT", "ALBUMIN" in the last 168 hours. No results for input(s): "LIPASE", "AMYLASE" in the last 168 hours. No results for input(s): "AMMONIA" in the last 168 hours.  CBC: Recent Labs  Lab 06/27/23 1258 06/28/23 0617 06/29/23 0506 06/29/23 0825  WBC 8.8 8.6 8.8 8.4  HGB 11.5* 11.2* 12.2* 11.9*  HCT 33.2* 33.6* 37.5* 36.8*  MCV 93.3 95.7 98.2 98.7  PLT 309 311 279 279    Cardiac Enzymes: No results for input(s): "CKTOTAL", "CKMB", "CKMBINDEX", "TROPONINI" in the last 168 hours.  BNP: Invalid input(s): "POCBNP"  CBG: No results for input(s): "GLUCAP" in the last 168 hours.  Microbiology: Results for orders placed or performed during the hospital encounter of 06/11/23  Culture, blood (routine x 2)     Status: None   Collection Time: 06/11/23  1:07 PM   Specimen: BLOOD  Result Value Ref Range Status   Specimen Description BLOOD LEFT ANTECUBITAL  Final   Special Requests   Final    BOTTLES DRAWN AEROBIC AND ANAEROBIC Blood Culture results may not be optimal due to an inadequate  volume of blood received in culture bottles   Culture   Final    NO GROWTH 5 DAYS Performed at [facility], [address] [place], [place] [address]    Report Status 06/16/2023 FINAL  Final    Coagulation Studies: No results for input(s): "LABPROT", "INR" in the last 72 hours.  Urinalysis: No results for input(s): "COLORURINE", "LABSPEC", "PHURINE", "GLUCOSEU", "HGBUR",  "BILIRUBINUR", "KETONESUR", "PROTEINUR", "UROBILINOGEN", "NITRITE", "LEUKOCYTESUR" in the last 72 hours.  Invalid input(s): "APPERANCEUR"    Imaging: PERIPHERAL VASCULAR CATHETERIZATION  Result Date: 06/28/2023 See surgical note for result.  DG Chest Portable 1 View  Result Date: 06/27/2023 CLINICAL DATA:  Wheezing. EXAM: PORTABLE CHEST 1 VIEW COMPARISON:  Chest x-ray dated June 11, 2023. FINDINGS: The heart size and mediastinal contours are within normal limits. Chronic central peribronchial thickening, presumably smoking-related. No focal consolidation, pleural effusion, or pneumothorax. The visualized skeletal structures are unremarkable. IMPRESSION: 1.  No active cardiopulmonary disease. Electronically Signed   By: Obie Dredge M.D.   On: 06/27/2023 15:43     Medications:    anticoagulant sodium citrate      carvedilol  25 mg Oral BID WC   Chlorhexidine Gluconate Cloth  6 each Topical Q0600   DULoxetine  60 mg Oral Daily   fluticasone furoate-vilanterol  1 puff Inhalation Daily   And   umeclidinium bromide  1 puff Inhalation Daily   furosemide  40 mg Intravenous Once   heparin  5,000 Units Subcutaneous Q8H   hydrALAZINE  100 mg Oral Q8H   lidocaine  1 patch Transdermal Q24H   losartan  25 mg Oral Daily   sodium chloride flush  3 mL Intravenous Q12H   topiramate  50 mg Oral BID   acetaminophen **OR** acetaminophen, alteplase, anticoagulant sodium citrate, fentaNYL (SUBLIMAZE) injection, heparin, ipratropium-albuterol, labetalol, lidocaine (PF), lidocaine-prilocaine, ondansetron **OR** ondansetron (ZOFRAN) IV, pentafluoroprop-tetrafluoroeth, polyethylene glycol  Assessment/ Plan:  [name] is a 57 y.o.  male with past medical conditions including COPD on home oxygen at 3 L, diabetes, struct of sleep apnea, anemia, hypertension, tobacco use, polysubstance abuse, CHF, end-stage renal disease on hemodialysis. Patient presents to emergency department with HD access  issues and inability to dialyze. Patient has been admitted under observation for ESRD on hemodialysis (HCC) [N18.6, Z99.2] Problem with dialysis access [facility]) [B84.665L] Problem with dialysis access, initial encounter [facility]) [D35.701X]   Malfunctioning dialysis access. Vascular placed Permcath on 06/28/23. Will schedule outpatient for further fistula evaluation.   2. End stage renal disease on hemodialysis. Patient received UF only treatment last night with UF 2.7. Receiving treatment today per outpatient schedule. Patient cramping and then clotted machine. Treatment terminated with less than an hour remaining. Next treatment scheduled for Tuesday.  Patient cleared to discharge from renal stance.   3. Anemia of chronic kidney disease Lab Results  Component Value Date   HGB 11.9 (L) 06/29/2023    Hgb above goal. Mircera prescribed at outpatient clinic  4. Secondary Hyperparathyroidism: with outpatient labs: PTH 433, phosphorus 5.2, calcium 7.7 on 06/18/23.   Lab Results  Component Value Date   PTH 135 (H) 12/07/2021   PTH Comment 12/07/2021   CALCIUM 8.5 (L) 06/29/2023   PHOS 4.2 03/10/2023    Bone at goal. Will continue to monitor.    LOS: 1   7/20/202411:06 AM

## 2023-06-29 NOTE — Progress Notes (Signed)
CSW contacted [place] with adapt. CSW ordered home oxygen for patient. Patient will also have a tank delivered to bedside.

## 2023-06-29 NOTE — Progress Notes (Signed)
Received patient in bed  Alert and oriented x4 Informed consent signed and in chart 06/28/23  TX duration:3 hours  Patient tolerated well.  Alert, without acute distress. Goal not met due to patient being hypotensive. 200cc bolus at the end of tx per md due to still being hypotensive.  Hand-off given to patient's nurse.   Access used: dialysis cath Access issues: line inverted due to high pressure alarm.   Total UF removed: 2700 Medication(s) given: none Post HD VS: see table below Post HD weight: 96kg     06/28/23 2316  Vitals  Temp 99 F (37.2 C)  Temp Source Oral  BP 116/76  MAP (mmHg) 88  BP Location Right Arm  BP Method Automatic  Patient Position (if appropriate) Lying  Pulse Rate (!) 51  Pulse Rate Source Monitor  ECG Heart Rate (!) 55  Resp 12  Oxygen Therapy  SpO2 100 %  O2 Device Nasal Cannula  O2 Flow Rate (L/min) 4 L/min  Patient Activity (if Appropriate) In bed  Pulse Oximetry Type Continuous  During Treatment Monitoring  HD Safety Checks Performed Yes  Intra-Hemodialysis Comments Tolerated well  Post Treatment  Dialyzer Clearance Heavily streaked  Duration of HD Treatment -hour(s) 3 hour(s)  Hemodialysis Intake (mL) 400 mL  Liters Processed 71.8  Fluid Removed (mL) 2700 mL  Tolerated HD Treatment Yes  Post-Hemodialysis Comments goal not met  Hemodialysis Catheter Right Internal jugular Double lumen Permanent (Tunneled)  Placement Date/Time: 06/28/23 1527   Serial / Lot #: [id number]  Expiration Date: 12/10/27  Time Out: Correct patient;Correct site;Correct procedure  Maximum sterile barrier precautions: Hand hygiene;Cap;Mask;Sterile gown;Sterile gloves;Large sterile s...  Site Condition No complications  Blue Lumen Status Flushed;Heparin locked;Dead end cap in place  Red Lumen Status Flushed;Heparin locked;Dead end cap in place  Purple Lumen Status N/A  Catheter fill solution Heparin 1000 units/ml  Catheter fill volume (Arterial) 1.6 cc  Catheter  fill volume (Venous) 1.6  Post treatment catheter status Capped and Clamped      [facility]

## 2023-07-01 ENCOUNTER — Encounter: Payer: Self-pay | Admitting: Vascular Surgery

## 2023-07-01 ENCOUNTER — Telehealth: Payer: Self-pay | Admitting: *Deleted

## 2023-07-01 NOTE — Transitions of Care (Post Inpatient/ED Visit) (Signed)
   07/01/2023  Name: Derek Cooley. MRN: [id number] DOB: 1966-05-19  Today's TOC FU Call Status: Today's TOC FU Call Status:: Unsuccessul Call (1st Attempt) Unsuccessful Call (1st Attempt) Date: 07/01/23  Attempted to reach the patient regarding the most recent Inpatient/ED visit.  Follow Up Plan: Additional outreach attempts will be made to reach the patient to complete the Transitions of Care (Post Inpatient/ED visit) call.   Gean Maidens BSN RN [facility] [phone]

## 2023-07-02 ENCOUNTER — Telehealth: Payer: Self-pay | Admitting: *Deleted

## 2023-07-02 NOTE — Transitions of Care (Post Inpatient/ED Visit) (Signed)
   07/02/2023  Name: Derek Cooley. MRN: [id number] DOB: 03/03/1966  Today's TOC FU Call Status: Today's TOC FU Call Status:: Unsuccessful Call (2nd Attempt) Unsuccessful Call (2nd Attempt) Date: 07/02/23  Attempted to reach the patient regarding the most recent Inpatient/ED visit. RN left message with sister for patient to call back.  Follow Up Plan: Additional outreach attempts will be made to reach the patient to complete the Transitions of Care (Post Inpatient/ED visit) call.   Gean Maidens BSN RN [facility] [phone]

## 2023-07-03 ENCOUNTER — Telehealth: Payer: Self-pay | Admitting: *Deleted

## 2023-07-03 ENCOUNTER — Ambulatory Visit: Payer: Medicare Other | Admitting: Nurse Practitioner

## 2023-07-03 NOTE — Transitions of Care (Post Inpatient/ED Visit) (Signed)
   07/03/2023  Name: Derek Cooley. MRN: [id number] DOB: [birth year]  Today's TOC FU Call Status: Today's TOC FU Call Status:: Unsuccessful Call (3rd Attempt) Unsuccessful Call (3rd Attempt) Date: 07/03/23  Attempted to reach the patient regarding the most recent Inpatient/ED visit.  Follow Up Plan: No further outreach attempts will be made at this time. We have been unable to contact the patient.  Gean Maidens BSN RN [facility] [phone]

## 2023-07-31 ENCOUNTER — Other Ambulatory Visit (INDEPENDENT_AMBULATORY_CARE_PROVIDER_SITE_OTHER): Payer: Self-pay | Admitting: Nurse Practitioner

## 2023-07-31 DIAGNOSIS — N186 End stage renal disease: Secondary | ICD-10-CM

## 2023-08-02 ENCOUNTER — Telehealth (INDEPENDENT_AMBULATORY_CARE_PROVIDER_SITE_OTHER): Payer: Self-pay

## 2023-08-02 ENCOUNTER — Encounter (INDEPENDENT_AMBULATORY_CARE_PROVIDER_SITE_OTHER): Payer: Medicare Other

## 2023-08-02 ENCOUNTER — Ambulatory Visit (INDEPENDENT_AMBULATORY_CARE_PROVIDER_SITE_OTHER): Payer: Medicare Other | Admitting: Nurse Practitioner

## 2023-08-02 NOTE — Telephone Encounter (Signed)
I contacted the patient's sister and she gave me a number to contact stating this was his cell number [phone] and to contact him because she didn't want to get in the middle of getting him scheduled. I attempted to contact the patient and a message was left for a return call.

## 2023-08-09 ENCOUNTER — Telehealth (INDEPENDENT_AMBULATORY_CARE_PROVIDER_SITE_OTHER): Payer: Self-pay

## 2023-08-09 NOTE — Telephone Encounter (Signed)
Spoke with the patient's sister and he is scheduled with Dr. Wyn Quaker for a permcath removal on 08/19/23 with a 2:00 pm arrival time to the [facility]. Pre-procedure instructions were discussed and will be sent to Mychart and mailed.

## 2023-08-19 ENCOUNTER — Ambulatory Visit: Admit: 2023-08-19 | Payer: Medicare Other | Admitting: Vascular Surgery

## 2023-08-19 DIAGNOSIS — N186 End stage renal disease: Secondary | ICD-10-CM

## 2023-08-19 SURGERY — DIALYSIS/PERMA CATHETER REMOVAL
Anesthesia: LOCAL

## 2023-09-05 ENCOUNTER — Telehealth (INDEPENDENT_AMBULATORY_CARE_PROVIDER_SITE_OTHER): Payer: Self-pay

## 2023-09-05 NOTE — Telephone Encounter (Signed)
I have attempted to contact the patient through multiple numbers within his family and left messages. The patient has been scheduled for a permcath removal on 09/09/23 with a 11:30 am arrival time to the [facility]. Pre-procedure instructions will be faxed to attention Annabelle Harman at [address] as this is a request to have this done ASAP.

## 2023-09-09 ENCOUNTER — Encounter
Admission: RE | Disposition: A | Discharge status: Home / Self Care | Payer: Self-pay | Source: Home / Self Care | Attending: Vascular Surgery

## 2023-09-09 ENCOUNTER — Ambulatory Visit: Payer: Medicare Other | Admitting: Nurse Practitioner

## 2023-09-09 ENCOUNTER — Ambulatory Visit
Admission: RE | Admit: 2023-09-09 | Discharge: 2023-09-09 | Disposition: A | Discharge status: Home / Self Care | Payer: Medicare Other | Attending: Vascular Surgery | Admitting: Vascular Surgery

## 2023-09-09 DIAGNOSIS — Z452 Encounter for adjustment and management of vascular access device: Secondary | ICD-10-CM | POA: Diagnosis not present

## 2023-09-09 DIAGNOSIS — Z4901 Encounter for fitting and adjustment of extracorporeal dialysis catheter: Secondary | ICD-10-CM | POA: Insufficient documentation

## 2023-09-09 DIAGNOSIS — I5032 Chronic diastolic (congestive) heart failure: Secondary | ICD-10-CM | POA: Insufficient documentation

## 2023-09-09 DIAGNOSIS — N186 End stage renal disease: Secondary | ICD-10-CM | POA: Diagnosis not present

## 2023-09-09 DIAGNOSIS — I132 Hypertensive heart and chronic kidney disease with heart failure and with stage 5 chronic kidney disease, or end stage renal disease: Secondary | ICD-10-CM | POA: Diagnosis not present

## 2023-09-09 DIAGNOSIS — Z95828 Presence of other vascular implants and grafts: Secondary | ICD-10-CM | POA: Diagnosis not present

## 2023-09-09 DIAGNOSIS — F17210 Nicotine dependence, cigarettes, uncomplicated: Secondary | ICD-10-CM | POA: Insufficient documentation

## 2023-09-09 DIAGNOSIS — Z992 Dependence on renal dialysis: Secondary | ICD-10-CM

## 2023-09-09 HISTORY — PX: DIALYSIS/PERMA CATHETER REMOVAL: CATH118289

## 2023-09-09 SURGERY — DIALYSIS/PERMA CATHETER REMOVAL
Anesthesia: LOCAL

## 2023-09-09 MED ORDER — LIDOCAINE-EPINEPHRINE (PF) 1 %-1:200000 IJ SOLN
INTRAMUSCULAR | Status: DC | PRN
  Administered 2023-09-09: 20 mL

## 2023-09-09 MED ORDER — IPRATROPIUM-ALBUTEROL 0.5-2.5 (3) MG/3ML IN SOLN
3.0000 mL | Freq: Once | RESPIRATORY_TRACT | Status: AC
  Administered 2023-09-09: 3 mL via RESPIRATORY_TRACT

## 2023-09-09 SURGICAL SUPPLY — 3 items
FORCEPS HALSTEAD CVD 5IN STRL (INSTRUMENTS) IMPLANT
SCALPEL PROTECTED #11 DISP (BLADE) IMPLANT
TRAY LACERAT/PLASTIC (MISCELLANEOUS) IMPLANT

## 2023-09-09 NOTE — H&P (Signed)
[facility] Admission History & Physical  MRN : [id number]  Derek Cooley. is a 57 y.o. (1966/12/03) male who presents with chief complaint of No chief complaint on file. Marland Kitchen  History of Present Illness: I am asked to evaluate the patient by the dialysis center. The patient was sent here because they have a nonfunctioning tunneled catheter and a functioning arm access.  The patient reports they're not been any problems with any of their dialysis runs. They are reporting good flows with good parameters at dialysis.   Patient denies pain or tenderness overlying the access.  There is no pain with dialysis.  The patient denies hand pain or finger pain consistent with steal syndrome.  No fevers or chills while on dialysis.    No current facility-administered medications for this encounter.    Past Medical History:  Diagnosis Date   Abdominal abscess    a.) chronic MRSA infection   Acute pancreatitis    Anxiety    Asthma    Brain aneurysm    a.) 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 b.) RIGHT crainotomy for clip ligation 05/14/2019.   CHF (congestive heart failure) (HCC)    Chronic back pain    Chronic heart failure with preserved ejection fraction (HFpEF) (HCC)    a. 11/2021 Echo: EF 55-60%, no rwma, mod LVH, GrI DD. Nl RV size/fxn. Mildly dil LA.   CKD (chronic kidney disease), stage V (HCC)    Depression    Emphysema of lung (HCC)    Erectile dysfunction    Followed by palliative care service    GERD (gastroesophageal reflux disease)    History of methicillin resistant staphylococcus aureus (MRSA)    HLD (hyperlipidemia)    Hypertension    Mild cognitive impairment    MRSA (methicillin resistant Staphylococcus aureus)    Obesity    OSA (obstructive sleep apnea) 2013   a.) not compliant with nocturnal PAP therapy; CPAP machine "lost or stolen".   Panic disorder    Perforated  bowel (HCC) 12/10/2005   Tempoary Colostomy Bag, Skin Graft for Abd wound   Polysubstance abuse (HCC)    a.) ETOH, tobacco, marijuana, methamphetamines, cocaine, BZO, opioids.   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   T2DM (type 2 diabetes mellitus) (HCC)    Tobacco abuse    Type 2 diabetes mellitus without complication, without long-term current use of insulin (HCC) 04/17/2016   Vitreous hemorrhage (HCC) 03/27/2011   Overview:  Bilateral; 01/2010, from [facility]     Past Surgical History:  Procedure Laterality Date   A/V FISTULAGRAM Left 07/16/2022   Procedure: A/V Fistulagram;  Surgeon: Annice Needy, MD;  Location: [facility] INVASIVE [facility];  Service: Cardiovascular;  Laterality: Left;   AV FISTULA PLACEMENT Left 05/24/2022   Procedure: ARTERIOVENOUS (AV) FISTULA CREATION ( RADIAL CEPHALIC);  Surgeon: Annice Needy, MD;  Location: [facility] ORS;  Service: Vascular;  Laterality: Left;   CEREBRAL ANEURYSM REPAIR Right 01/26/2010   Procedure: CEREBRAL ANEURYSM REPAIR (COIL EMBOLIZATION)   CEREBRAL ANEURYSM REPAIR Right 05/14/2019   Procedure: CRAINOTOMY FOR CEREBRAL ANEURYSM REPAIR (CLIP LIGATION)   COLON SURGERY  12/10/2005   colostomy bag placed s/p perforated bowel   COLONOSCOPY WITH PROPOFOL N/A 05/18/2020   Procedure: COLONOSCOPY WITH PROPOFOL;  Surgeon: Toney Reil, MD;  Location: [facility] ENDOSCOPY;  Service: Gastroenterology;  Laterality: N/A;   DIALYSIS/PERMA CATHETER INSERTION N/A 06/27/2022   Procedure: DIALYSIS/PERMA CATHETER INSERTION;  Surgeon: Renford Dills, MD;  Location: [facility];  Service: Cardiovascular;  Laterality: N/A;   DIALYSIS/PERMA CATHETER INSERTION N/A 06/28/2023   Procedure: DIALYSIS/PERMA CATHETER INSERTION;  Surgeon: Renford Dills, MD;  Location: [facility];  Service: Cardiovascular;  Laterality: N/A;   DIALYSIS/PERMA  CATHETER REMOVAL N/A 05/20/2023   Procedure: DIALYSIS/PERMA CATHETER REMOVAL;  Surgeon: Annice Needy, MD;  Location: [facility];  Service: Cardiovascular;  Laterality: N/A;   KNEE SURGERY Right    Perforated bowel       Social History   Tobacco Use   Smoking status: Every Day    Current packs/day: 1.00    Average packs/day: 1 pack/day for 40.0 years (40.0 ttl pk-yrs)    Types: Cigarettes    Start date: 10/11/1984   Smokeless tobacco: Never  Vaping Use   Vaping status: Never Used  Substance Use Topics   Alcohol use: Yes   Drug use: Not Currently    Types: "Crack" cocaine, Amphetamines, Methamphetamines, Benzodiazepines, Cocaine, Marijuana, Other-see comments    Comment: opioids     Family History  Problem Relation Age of Onset   Arthritis Mother    Asthma Mother    Diabetes Mother    Heart disease Mother    Hyperlipidemia Mother    Hypertension Mother    Kidney disease Mother    Thyroid disease Mother    Lung disease Mother    Anxiety disorder Mother    Depression Mother    Diabetes Father    Heart disease Father    Depression Father    Anxiety disorder Father    Arthritis Sister    Asthma Sister    Hyperlipidemia Sister    Hypertension Sister    Lung disease Sister    Anxiety disorder Sister    Depression Sister    Hyperlipidemia Brother    Hypertension Brother    Diabetes Sister    Heart disease Sister    Depression Sister    Anxiety disorder Sister    Anxiety disorder Brother    Depression Brother    Heart disease Brother     No family history of bleeding or clotting disorders, autoimmune disease or porphyria  Allergies  Allergen Reactions   Atorvastatin     Joint Aches - Severe Joint Aches - Severe Joint Aches - Severe   Avelox [Moxifloxacin Hcl In Nacl]     Muscle pain   Buprenorphine     Mouth sores, confusion, shaking   Dilaudid [Hydromorphone Hcl] Hives   Fluoxetine Itching   Levofloxacin Other (See Comments)    Joint Pain    Morphine And Codeine    Other     Muscle pain   Suboxone [Buprenorphine Hcl-Naloxone Hcl] Other (See Comments)    Rash and confused   Vancomycin     Renal insufficiency     REVIEW OF SYSTEMS (Negative unless checked)  Constitutional: [] Weight loss  [] Fever  [] Chills Cardiac: [] Chest pain   [] Chest pressure   [] Palpitations   [] Shortness of breath when laying flat   [] Shortness of breath at rest   [x] Shortness of breath with exertion. Vascular:  [] Pain in legs with walking   [] Pain in legs at rest   [] Pain in legs when laying flat   [] Claudication   [] Pain in feet when walking  [] Pain in feet at rest  [] Pain in feet  when laying flat   [] History of DVT   [] Phlebitis   [] Swelling in legs   [] Varicose veins   [] Non-healing ulcers Pulmonary:   [] Uses home oxygen   [] Productive cough   [] Hemoptysis   [] Wheeze  [] COPD   [] Asthma Neurologic:  [] Dizziness  [] Blackouts   [] Seizures   [x] History of stroke   [] History of TIA  [] Aphasia   [] Temporary blindness   [] Dysphagia   [] Weakness or numbness in arms   [] Weakness or numbness in legs Musculoskeletal:  [x] Arthritis   [] Joint swelling   [x] Joint pain   [] Low back pain Hematologic:  [] Easy bruising  [] Easy bleeding   [] Hypercoagulable state   [x] Anemic  [] Hepatitis Gastrointestinal:  [] Blood in stool   [] Vomiting blood  [x] Gastroesophageal reflux/heartburn   [] Difficulty swallowing. Genitourinary:  [x] Chronic kidney disease   [] Difficult urination  [] Frequent urination  [] Burning with urination   [] Blood in urine Skin:  [] Rashes   [] Ulcers   [] Wounds Psychological:  [] History of anxiety   []  History of major depression.  Physical Examination  There were no vitals filed for this visit. There is no height or weight on file to calculate BMI. Gen: WD/WN, NAD Head: [place]/AT, No temporalis wasting.  Ear/Nose/Throat: Hearing grossly intact, nares w/o erythema or drainage, oropharynx w/o Erythema/Exudate,  Eyes: Conjunctiva clear, sclera non-icteric Neck:  Trachea midline.  No JVD.  Pulmonary:  Good air movement, respirations not labored, no use of accessory muscles.  Cardiac: RRR, normal S1, S2. Vascular:  Vessel Right Left  Radial Palpable Palpable   Musculoskeletal: M/S 5/5 throughout.  Extremities without ischemic changes.  No deformity or atrophy.  Neurologic: Sensation grossly intact in extremities.  Symmetrical.  Speech is fluent. Motor exam as listed above. Psychiatric: Judgment intact, Mood & affect appropriate for pt's clinical situation. Dermatologic: No rashes or ulcers noted.  No cellulitis or open wounds.    CBC Lab Results  Component Value Date   WBC 8.4 06/29/2023   HGB 11.9 (L) 06/29/2023   HCT 36.8 (L) 06/29/2023   MCV 98.7 06/29/2023   PLT 279 06/29/2023    BMET    Component Value Date/Time   NA 139 06/29/2023 0506   NA 139 06/10/2023 1431   NA 139 08/12/2014 1019   K 4.7 06/29/2023 0506   K 3.9 08/12/2014 1019   CL 109 06/29/2023 0506   CL 103 08/12/2014 1019   CO2 21 (L) 06/29/2023 0506   CO2 27 08/12/2014 1019   GLUCOSE 114 (H) 06/29/2023 0506   GLUCOSE 100 (H) 08/12/2014 1019   BUN 57 (H) 06/29/2023 0506   BUN 37 (H) 06/10/2023 1431   BUN 17 08/12/2014 1019   CREATININE 6.50 (H) 06/29/2023 0506   CREATININE 1.55 (H) 08/12/2014 1019   CALCIUM 8.5 (L) 06/29/2023 0506   CALCIUM 9.1 08/12/2014 1019   GFRNONAA 9 (L) 06/29/2023 0506   GFRNONAA 52 (L) 08/12/2014 1019   GFRAA 65 06/29/2020 1033   GFRAA >60 08/12/2014 1019   CrCl cannot be calculated (Patient's most recent lab result is older than the maximum 21 days allowed.).  COAG No results found for: "INR", "PROTIME"  Radiology No results found.  Assessment/Plan 1.  Complication dialysis device with thrombosis AV access:  Patient's Tunneled catheter is not being used. The patient has an extremity access that is functioning well. Therefore, the patient will undergo removal of the tunneled catheter under local anesthesia.  The risks and  benefits were described to the patient.  All questions were answered.  The  patient agrees to proceed with angiography and intervention. Potassium will be drawn to ensure that it is an appropriate level prior to performing intervention. 2.  End-stage renal disease requiring hemodialysis:  Patient will continue dialysis therapy without further interruption 3.  Hypertension:  Patient will continue medical management; nephrology is following no changes in oral medications.      Festus Barren, MD  09/09/2023 11:20 AM

## 2023-09-09 NOTE — Op Note (Signed)
Operative Note     Preoperative diagnosis:   1. ESRD with functional permanent access  Postoperative diagnosis:  1. ESRD with functional permanent access  Procedure:  Removal of right jugular Permcath  Surgeon:  Festus Barren, MD  Anesthesia:  Local  EBL:  Minimal  Indication for the Procedure:  The patient has a functional permanent dialysis access and no longer needs their permcath.  This can be removed.  Risks and benefits are discussed and informed consent is obtained.  Description of the Procedure:  The patient's right neck, chest and existing catheter were sterilely prepped and draped. The area around the catheter was anesthetized copiously with 1% lidocaine. The catheter was dissected out with curved hemostats until the cuff was freed from the surrounding fibrous sheath. The fiber sheath was transected, and the catheter was then removed in its entirety using gentle traction. Pressure was held and sterile dressings were placed. The patient tolerated the procedure well and was taken to the recovery room in stable condition.     Festus Barren  09/09/2023, 2:34 PM This note was created with Dragon Medical transcription system. Any errors in dictation are purely unintentional.

## 2023-09-10 ENCOUNTER — Encounter: Payer: Self-pay | Admitting: Vascular Surgery

## 2023-09-11 ENCOUNTER — Ambulatory Visit: Payer: Medicare Other | Admitting: Nurse Practitioner

## 2023-09-16 ENCOUNTER — Inpatient Hospital Stay: Payer: Medicare Other

## 2023-09-16 ENCOUNTER — Ambulatory Visit: Payer: Medicare Other | Admitting: Nurse Practitioner

## 2023-09-16 ENCOUNTER — Inpatient Hospital Stay
Admission: EM | Admit: 2023-09-16 | Discharge: 2023-09-18 | DRG: 291 | Disposition: A | Discharge status: Home / Self Care | Payer: Medicare Other | Attending: Internal Medicine | Admitting: Internal Medicine

## 2023-09-16 ENCOUNTER — Emergency Department: Payer: Medicare Other

## 2023-09-16 ENCOUNTER — Other Ambulatory Visit: Payer: Self-pay

## 2023-09-16 DIAGNOSIS — Z1152 Encounter for screening for COVID-19: Secondary | ICD-10-CM

## 2023-09-16 DIAGNOSIS — Z7951 Long term (current) use of inhaled steroids: Secondary | ICD-10-CM

## 2023-09-16 DIAGNOSIS — J441 Chronic obstructive pulmonary disease with (acute) exacerbation: Secondary | ICD-10-CM | POA: Diagnosis present

## 2023-09-16 DIAGNOSIS — I2489 Other forms of acute ischemic heart disease: Secondary | ICD-10-CM | POA: Diagnosis present

## 2023-09-16 DIAGNOSIS — Z8249 Family history of ischemic heart disease and other diseases of the circulatory system: Secondary | ICD-10-CM

## 2023-09-16 DIAGNOSIS — E669 Obesity, unspecified: Secondary | ICD-10-CM | POA: Diagnosis present

## 2023-09-16 DIAGNOSIS — I5030 Unspecified diastolic (congestive) heart failure: Secondary | ICD-10-CM | POA: Diagnosis present

## 2023-09-16 DIAGNOSIS — I5033 Acute on chronic diastolic (congestive) heart failure: Secondary | ICD-10-CM | POA: Diagnosis present

## 2023-09-16 DIAGNOSIS — I5032 Chronic diastolic (congestive) heart failure: Secondary | ICD-10-CM | POA: Diagnosis present

## 2023-09-16 DIAGNOSIS — Z888 Allergy status to other drugs, medicaments and biological substances status: Secondary | ICD-10-CM

## 2023-09-16 DIAGNOSIS — K219 Gastro-esophageal reflux disease without esophagitis: Secondary | ICD-10-CM | POA: Diagnosis present

## 2023-09-16 DIAGNOSIS — Z5919 Other inadequate housing: Secondary | ICD-10-CM

## 2023-09-16 DIAGNOSIS — G4733 Obstructive sleep apnea (adult) (pediatric): Secondary | ICD-10-CM | POA: Diagnosis present

## 2023-09-16 DIAGNOSIS — I119 Hypertensive heart disease without heart failure: Secondary | ICD-10-CM | POA: Diagnosis present

## 2023-09-16 DIAGNOSIS — J9621 Acute and chronic respiratory failure with hypoxia: Secondary | ICD-10-CM | POA: Diagnosis present

## 2023-09-16 DIAGNOSIS — Z841 Family history of disorders of kidney and ureter: Secondary | ICD-10-CM

## 2023-09-16 DIAGNOSIS — N186 End stage renal disease: Secondary | ICD-10-CM

## 2023-09-16 DIAGNOSIS — F1510 Other stimulant abuse, uncomplicated: Secondary | ICD-10-CM | POA: Diagnosis present

## 2023-09-16 DIAGNOSIS — I509 Heart failure, unspecified: Secondary | ICD-10-CM

## 2023-09-16 DIAGNOSIS — Z79899 Other long term (current) drug therapy: Secondary | ICD-10-CM

## 2023-09-16 DIAGNOSIS — Z8349 Family history of other endocrine, nutritional and metabolic diseases: Secondary | ICD-10-CM

## 2023-09-16 DIAGNOSIS — N2581 Secondary hyperparathyroidism of renal origin: Secondary | ICD-10-CM | POA: Diagnosis present

## 2023-09-16 DIAGNOSIS — J9622 Acute and chronic respiratory failure with hypercapnia: Secondary | ICD-10-CM

## 2023-09-16 DIAGNOSIS — I132 Hypertensive heart and chronic kidney disease with heart failure and with stage 5 chronic kidney disease, or end stage renal disease: Principal | ICD-10-CM | POA: Diagnosis present

## 2023-09-16 DIAGNOSIS — E1122 Type 2 diabetes mellitus with diabetic chronic kidney disease: Secondary | ICD-10-CM | POA: Diagnosis present

## 2023-09-16 DIAGNOSIS — Z881 Allergy status to other antibiotic agents status: Secondary | ICD-10-CM

## 2023-09-16 DIAGNOSIS — F09 Unspecified mental disorder due to known physiological condition: Secondary | ICD-10-CM | POA: Diagnosis present

## 2023-09-16 DIAGNOSIS — Z833 Family history of diabetes mellitus: Secondary | ICD-10-CM

## 2023-09-16 DIAGNOSIS — Z818 Family history of other mental and behavioral disorders: Secondary | ICD-10-CM

## 2023-09-16 DIAGNOSIS — B888 Other specified infestations: Secondary | ICD-10-CM | POA: Diagnosis not present

## 2023-09-16 DIAGNOSIS — R7989 Other specified abnormal findings of blood chemistry: Secondary | ICD-10-CM | POA: Diagnosis not present

## 2023-09-16 DIAGNOSIS — D631 Anemia in chronic kidney disease: Secondary | ICD-10-CM | POA: Diagnosis present

## 2023-09-16 DIAGNOSIS — Z885 Allergy status to narcotic agent status: Secondary | ICD-10-CM

## 2023-09-16 DIAGNOSIS — Z8261 Family history of arthritis: Secondary | ICD-10-CM

## 2023-09-16 DIAGNOSIS — Z8673 Personal history of transient ischemic attack (TIA), and cerebral infarction without residual deficits: Secondary | ICD-10-CM

## 2023-09-16 DIAGNOSIS — I161 Hypertensive emergency: Principal | ICD-10-CM

## 2023-09-16 DIAGNOSIS — Z87891 Personal history of nicotine dependence: Secondary | ICD-10-CM

## 2023-09-16 DIAGNOSIS — Z9981 Dependence on supplemental oxygen: Secondary | ICD-10-CM

## 2023-09-16 DIAGNOSIS — Z83438 Family history of other disorder of lipoprotein metabolism and other lipidemia: Secondary | ICD-10-CM

## 2023-09-16 DIAGNOSIS — Z992 Dependence on renal dialysis: Secondary | ICD-10-CM | POA: Diagnosis not present

## 2023-09-16 DIAGNOSIS — J439 Emphysema, unspecified: Secondary | ICD-10-CM | POA: Diagnosis present

## 2023-09-16 DIAGNOSIS — Z91148 Patient's other noncompliance with medication regimen for other reason: Secondary | ICD-10-CM

## 2023-09-16 DIAGNOSIS — Z6833 Body mass index (BMI) 33.0-33.9, adult: Secondary | ICD-10-CM | POA: Diagnosis not present

## 2023-09-16 DIAGNOSIS — E785 Hyperlipidemia, unspecified: Secondary | ICD-10-CM | POA: Diagnosis present

## 2023-09-16 DIAGNOSIS — J449 Chronic obstructive pulmonary disease, unspecified: Secondary | ICD-10-CM | POA: Diagnosis present

## 2023-09-16 DIAGNOSIS — Z825 Family history of asthma and other chronic lower respiratory diseases: Secondary | ICD-10-CM

## 2023-09-16 LAB — CBC WITH DIFFERENTIAL/PLATELET
Abs Immature Granulocytes: 0.1 10*3/uL — ABNORMAL HIGH (ref 0.00–0.07)
Basophils Absolute: 0.1 10*3/uL (ref 0.0–0.1)
Basophils Relative: 1 %
Eosinophils Absolute: 0.5 10*3/uL (ref 0.0–0.5)
Eosinophils Relative: 5 %
HCT: 34 % — ABNORMAL LOW (ref 39.0–52.0)
Hemoglobin: 11.7 g/dL — ABNORMAL LOW (ref 13.0–17.0)
Immature Granulocytes: 1 %
Lymphocytes Relative: 7 %
Lymphs Abs: 0.8 10*3/uL (ref 0.7–4.0)
MCH: 33.5 pg (ref 26.0–34.0)
MCHC: 34.4 g/dL (ref 30.0–36.0)
MCV: 97.4 fL (ref 80.0–100.0)
Monocytes Absolute: 0.6 10*3/uL (ref 0.1–1.0)
Monocytes Relative: 5 %
Neutro Abs: 9 10*3/uL — ABNORMAL HIGH (ref 1.7–7.7)
Neutrophils Relative %: 81 %
Platelets: 226 10*3/uL (ref 150–400)
RBC: 3.49 MIL/uL — ABNORMAL LOW (ref 4.22–5.81)
RDW: 14.4 % (ref 11.5–15.5)
WBC: 11 10*3/uL — ABNORMAL HIGH (ref 4.0–10.5)
nRBC: 0 % (ref 0.0–0.2)

## 2023-09-16 LAB — COMPREHENSIVE METABOLIC PANEL
ALT: 19 U/L (ref 0–44)
AST: 19 U/L (ref 15–41)
Albumin: 4 g/dL (ref 3.5–5.0)
Alkaline Phosphatase: 96 U/L (ref 38–126)
Anion gap: 13 (ref 5–15)
BUN: 79 mg/dL — ABNORMAL HIGH (ref 6–20)
CO2: 27 mmol/L (ref 22–32)
Calcium: 8.2 mg/dL — ABNORMAL LOW (ref 8.9–10.3)
Chloride: 104 mmol/L (ref 98–111)
Creatinine, Ser: 7.32 mg/dL — ABNORMAL HIGH (ref 0.61–1.24)
GFR, Estimated: 8 mL/min — ABNORMAL LOW (ref >60)
Glucose, Bld: 133 mg/dL — ABNORMAL HIGH (ref 70–99)
Potassium: 4.7 mmol/L (ref 3.5–5.1)
Sodium: 144 mmol/L (ref 135–145)
Total Bilirubin: 1 mg/dL (ref 0.3–1.2)
Total Protein: 7.4 g/dL (ref 6.5–8.1)

## 2023-09-16 LAB — BLOOD GAS, VENOUS
Acid-base deficit: 0.9 mmol/L (ref 0.0–2.0)
Bicarbonate: 28.8 mmol/L — ABNORMAL HIGH (ref 20.0–28.0)
O2 Saturation: 77.9 %
Patient temperature: 37
pCO2, Ven: 72 mm[Hg] (ref 44–60)
pH, Ven: 7.21 — ABNORMAL LOW (ref 7.25–7.43)
pO2, Ven: 48 mm[Hg] — ABNORMAL HIGH (ref 32–45)

## 2023-09-16 LAB — RESP PANEL BY RT-PCR (RSV, FLU A&B, COVID)  RVPGX2
Influenza A by PCR: NEGATIVE
Influenza B by PCR: NEGATIVE
Resp Syncytial Virus by PCR: NEGATIVE
SARS Coronavirus 2 by RT PCR: NEGATIVE

## 2023-09-16 LAB — BRAIN NATRIURETIC PEPTIDE: B Natriuretic Peptide: 633.4 pg/mL — ABNORMAL HIGH (ref 0.0–100.0)

## 2023-09-16 LAB — TROPONIN I (HIGH SENSITIVITY)
Troponin I (High Sensitivity): 121 ng/L (ref <18)
Troponin I (High Sensitivity): 107 ng/L (ref <18)

## 2023-09-16 LAB — MRSA NEXT GEN BY PCR, NASAL: MRSA by PCR Next Gen: NOT DETECTED

## 2023-09-16 LAB — HEPATITIS B SURFACE ANTIGEN: Hepatitis B Surface Ag: NONREACTIVE

## 2023-09-16 LAB — LACTIC ACID, PLASMA: Lactic Acid, Venous: 0.6 mmol/L (ref 0.5–1.9)

## 2023-09-16 MED ORDER — DULOXETINE HCL 30 MG PO CPEP
60.0000 mg | ORAL_CAPSULE | Freq: Every day | ORAL | Status: DC
  Administered 2023-09-17 – 2023-09-18 (×2): 60 mg via ORAL
  Filled 2023-09-16 (×2): qty 2

## 2023-09-16 MED ORDER — CARVEDILOL 25 MG PO TABS: 25.0000 mg | ORAL_TABLET | Freq: Two times a day (BID) | ORAL | Status: DC

## 2023-09-16 MED ORDER — ACETAMINOPHEN 500 MG PO TABS
1000.0000 mg | ORAL_TABLET | Freq: Once | ORAL | Status: AC
  Administered 2023-09-16: 1000 mg via ORAL
  Filled 2023-09-16: qty 2

## 2023-09-16 MED ORDER — HEPARIN SODIUM (PORCINE) 5000 UNIT/ML IJ SOLN
5000.0000 [IU] | Freq: Two times a day (BID) | INTRAMUSCULAR | Status: DC
  Administered 2023-09-16 – 2023-09-17 (×3): 5000 [IU] via SUBCUTANEOUS
  Filled 2023-09-16 (×3): qty 1

## 2023-09-16 MED ORDER — CHLORHEXIDINE GLUCONATE CLOTH 2 % EX PADS
6.0000 | MEDICATED_PAD | Freq: Every day | CUTANEOUS | Status: DC
  Administered 2023-09-16 – 2023-09-18 (×3): 6 via TOPICAL
  Filled 2023-09-16: qty 6

## 2023-09-16 MED ORDER — NICOTINE 21 MG/24HR TD PT24
21.0000 mg | MEDICATED_PATCH | Freq: Every day | TRANSDERMAL | Status: DC
  Administered 2023-09-16 – 2023-09-18 (×3): 21 mg via TRANSDERMAL
  Filled 2023-09-16 (×3): qty 1

## 2023-09-16 MED ORDER — SODIUM CHLORIDE 0.9 % IV SOLN: 250.0000 mL | INTRAVENOUS | Status: DC | PRN

## 2023-09-16 MED ORDER — IPRATROPIUM-ALBUTEROL 0.5-2.5 (3) MG/3ML IN SOLN
3.0000 mL | Freq: Once | RESPIRATORY_TRACT | Status: AC
  Administered 2023-09-16: 3 mL via RESPIRATORY_TRACT
  Filled 2023-09-16: qty 3

## 2023-09-16 MED ORDER — ONDANSETRON HCL 4 MG/2ML IJ SOLN
4.0000 mg | Freq: Four times a day (QID) | INTRAMUSCULAR | Status: DC | PRN
  Administered 2023-09-16: 4 mg via INTRAVENOUS
  Filled 2023-09-16: qty 2

## 2023-09-16 MED ORDER — LIDOCAINE-PRILOCAINE 2.5-2.5 % EX CREA
1.0000 | TOPICAL_CREAM | Freq: Every day | CUTANEOUS | Status: DC
  Filled 2023-09-16: qty 5

## 2023-09-16 MED ORDER — SODIUM CHLORIDE 0.9% FLUSH: 3.0000 mL | INTRAVENOUS | Status: DC | PRN

## 2023-09-16 MED ORDER — FUROSEMIDE 10 MG/ML IJ SOLN
120.0000 mg | Freq: Once | INTRAVENOUS | Status: AC
  Administered 2023-09-16: 120 mg via INTRAVENOUS
  Filled 2023-09-16: qty 10

## 2023-09-16 MED ORDER — METHYLPREDNISOLONE SODIUM SUCC 125 MG IJ SOLR
60.0000 mg | Freq: Once | INTRAMUSCULAR | Status: AC
  Administered 2023-09-16: 60 mg via INTRAVENOUS
  Filled 2023-09-16: qty 2

## 2023-09-16 MED ORDER — TORSEMIDE 10 MG PO TABS: 5.0000 mg | ORAL_TABLET | Freq: Every day | ORAL | Status: DC

## 2023-09-16 MED ORDER — SODIUM CHLORIDE 0.9% FLUSH
3.0000 mL | Freq: Two times a day (BID) | INTRAVENOUS | Status: DC
  Administered 2023-09-16 – 2023-09-17 (×2): 3 mL via INTRAVENOUS

## 2023-09-16 MED ORDER — PANTOPRAZOLE SODIUM 40 MG IV SOLR
40.0000 mg | INTRAVENOUS | Status: DC
  Administered 2023-09-16 – 2023-09-17 (×2): 40 mg via INTRAVENOUS
  Filled 2023-09-16 (×4): qty 10

## 2023-09-16 MED ORDER — HYDRALAZINE HCL 50 MG PO TABS
100.0000 mg | ORAL_TABLET | Freq: Three times a day (TID) | ORAL | Status: DC
  Administered 2023-09-16 – 2023-09-18 (×6): 100 mg via ORAL
  Filled 2023-09-16 (×6): qty 2

## 2023-09-16 MED ORDER — NITROGLYCERIN IN D5W 200-5 MCG/ML-% IV SOLN
0.0000 ug/min | INTRAVENOUS | Status: DC
  Administered 2023-09-16: 100 ug/min via INTRAVENOUS
  Filled 2023-09-16: qty 250

## 2023-09-16 MED ORDER — HYDRALAZINE HCL 20 MG/ML IJ SOLN: 10.0000 mg | Freq: Four times a day (QID) | INTRAMUSCULAR | Status: DC | PRN

## 2023-09-16 MED ORDER — PREDNISONE 20 MG PO TABS
40.0000 mg | ORAL_TABLET | Freq: Every day | ORAL | Status: DC
  Administered 2023-09-17 – 2023-09-18 (×2): 40 mg via ORAL
  Filled 2023-09-16 (×2): qty 2

## 2023-09-16 MED ORDER — CARVEDILOL 25 MG PO TABS
25.0000 mg | ORAL_TABLET | Freq: Two times a day (BID) | ORAL | Status: DC
  Administered 2023-09-16 – 2023-09-18 (×6): 25 mg via ORAL
  Filled 2023-09-16 (×6): qty 1

## 2023-09-16 MED ORDER — HYDRALAZINE HCL 50 MG PO TABS: 100.0000 mg | ORAL_TABLET | Freq: Two times a day (BID) | ORAL | Status: DC

## 2023-09-16 MED ORDER — ACETAMINOPHEN 325 MG PO TABS
650.0000 mg | ORAL_TABLET | ORAL | Status: DC | PRN
  Administered 2023-09-17 – 2023-09-18 (×2): 650 mg via ORAL
  Filled 2023-09-16: qty 2

## 2023-09-16 MED ORDER — LABETALOL HCL 5 MG/ML IV SOLN
20.0000 mg | Freq: Once | INTRAVENOUS | Status: DC
  Filled 2023-09-16: qty 4

## 2023-09-16 MED ORDER — ALBUTEROL SULFATE (2.5 MG/3ML) 0.083% IN NEBU: 3.0000 mL | INHALATION_SOLUTION | Freq: Four times a day (QID) | RESPIRATORY_TRACT | Status: DC | PRN

## 2023-09-16 MED ORDER — PANTOPRAZOLE SODIUM 40 MG PO TBEC: 40.0000 mg | DELAYED_RELEASE_TABLET | Freq: Every day | ORAL | Status: DC

## 2023-09-16 MED ORDER — FLUTICASONE FUROATE-VILANTEROL 100-25 MCG/ACT IN AEPB
1.0000 | INHALATION_SPRAY | Freq: Every day | RESPIRATORY_TRACT | Status: DC
  Administered 2023-09-16 – 2023-09-18 (×3): 1 via RESPIRATORY_TRACT
  Filled 2023-09-16: qty 28

## 2023-09-16 MED ORDER — UMECLIDINIUM BROMIDE 62.5 MCG/ACT IN AEPB
1.0000 | INHALATION_SPRAY | Freq: Every day | RESPIRATORY_TRACT | Status: DC
  Administered 2023-09-16 – 2023-09-18 (×3): 1 via RESPIRATORY_TRACT
  Filled 2023-09-16: qty 7

## 2023-09-16 MED ORDER — DULOXETINE HCL 60 MG PO CPEP: 60.0000 mg | ORAL_CAPSULE | Freq: Every day | ORAL | Status: DC

## 2023-09-16 NOTE — Progress Notes (Signed)
[facility]  ROUNDING NOTE   Subjective:   Derek Cooley.   is a 57 year old male with past medical conditions including COPD on home oxygen at 3 L, diabetes, struct of sleep apnea, anemia, hypertension, tobacco use, polysubstance abuse, CHF, end-stage renal disease on hemodialysis. Patient presents to emergency department with shortness of breath and has been admitted for Infestation by bed bug [B88.8] CHF (congestive heart failure) (HCC) [I50.9] Elevated troponin level [R79.89] ESRD (end stage renal disease) on dialysis (HCC) [N18.6, Z99.2] Acute and chronic respiratory failure with hypercapnia (HCC) [J96.22] Hypertensive emergency [I16.1]   Patient is known to our practice and receives outpatient dialysis treatments at [facility] on a TTS schedule, supervised by Dr Cherylann Ratel. Patient seen laying on stretcher in ED. Bipap in place. nitroglycerin drip in place. Drowsy. Chart review states he missed previous 2 treatments.   Labs concerning for BNP 633.4, troponin 121, and WBC 11.0. Chest xray negative for acute findings.   We have been consulted to provide dialysis.   Objective:  Vital signs in last 24 hours:  Temp:  [96.1 F (35.6 C)-98.2 F (36.8 C)] 96.1 F (35.6 C) (10/07 1211) Pulse Rate:  [61-93] 61 (10/07 1243) Resp:  [14-26] 20 (10/07 1243) BP: (154-240)/(85-147) 155/85 (10/07 1243) SpO2:  [86 %-100 %] 96 % (10/07 1243) FiO2 (%):  [30 %] 30 % (10/07 0643) Weight:  [95 kg] 95 kg (10/07 1243)  Weight change:  Filed Weights   09/16/23 1243  Weight: 95 kg    Intake/Output: No intake/output data recorded.   Intake/Output this shift:  Total I/O In: -  Out: 325 [Urine:325]  Physical Exam: General: NAD  Head: Normocephalic, atraumatic. Moist oral mucosal membranes  Eyes: Anicteric  Lungs:  Clear to auscultation  Heart: Regular rate and rhythm  Abdomen:  Soft, nontender  Extremities:  No peripheral edema.  Neurologic: Alert, moving all four  extremities  Skin: No lesions  Access: LT AVF      Basic Metabolic Panel: Recent Labs  Lab 09/16/23 0530  NA 144  K 4.7  CL 104  CO2 27  GLUCOSE 133*  BUN 79*  CREATININE 7.32*  CALCIUM 8.2*    Liver Function Tests: Recent Labs  Lab 09/16/23 0530  AST 19  ALT 19  ALKPHOS 96  BILITOT 1.0  PROT 7.4  ALBUMIN 4.0   No results for input(s): "LIPASE", "AMYLASE" in the last 168 hours. No results for input(s): "AMMONIA" in the last 168 hours.  CBC: Recent Labs  Lab 09/16/23 0530  WBC 11.0*  NEUTROABS 9.0*  HGB 11.7*  HCT 34.0*  MCV 97.4  PLT 226    Cardiac Enzymes: No results for input(s): "CKTOTAL", "CKMB", "CKMBINDEX", "TROPONINI" in the last 168 hours.  BNP: Invalid input(s): "POCBNP"  CBG: No results for input(s): "GLUCAP" in the last 168 hours.  Microbiology: Results for orders placed or performed during the hospital encounter of 09/16/23  Resp panel by RT-PCR (RSV, Flu A&B, Covid) Anterior Nasal Swab     Status: None   Collection Time: 09/16/23  5:30 AM   Specimen: Anterior Nasal Swab  Result Value Ref Range Status   SARS Coronavirus 2 by RT PCR NEGATIVE NEGATIVE Final    Comment: (NOTE) SARS-CoV-2 target nucleic acids are NOT DETECTED.  The SARS-CoV-2 RNA is generally detectable in upper respiratory specimens during the acute phase of infection. The lowest concentration of SARS-CoV-2 viral copies this assay can detect is 138 copies/mL. A negative result does not preclude  SARS-Cov-2 infection and should not be used as the sole basis for treatment or other patient management decisions. A negative result may occur with  improper specimen collection/handling, submission of specimen other than nasopharyngeal swab, presence of viral mutation(s) within the areas targeted by this assay, and inadequate number of viral copies(<138 copies/mL). A negative result must be combined with clinical observations, patient history, and  epidemiological information. The expected result is Negative.  Fact Sheet for Patients:  BloggerCourse.com  Fact Sheet for Healthcare Providers:  SeriousBroker.it  This test is no t yet approved or cleared by the [place] FDA and  has been authorized for detection and/or diagnosis of SARS-CoV-2 by FDA under an Emergency Use Authorization (EUA). This EUA will remain  in effect (meaning this test can be used) for the duration of the COVID-19 declaration under Section 564(b)(1) of the Act, 21 U.S.C.section 360bbb-3(b)(1), unless the authorization is terminated  or revoked sooner.       Influenza A by PCR NEGATIVE NEGATIVE Final   Influenza B by PCR NEGATIVE NEGATIVE Final    Comment: (NOTE) The Xpert Xpress SARS-CoV-2/FLU/RSV plus assay is intended as an aid in the diagnosis of influenza from Nasopharyngeal swab specimens and should not be used as a sole basis for treatment. Nasal washings and aspirates are unacceptable for Xpert Xpress SARS-CoV-2/FLU/RSV testing.  Fact Sheet for Patients: BloggerCourse.com  Fact Sheet for Healthcare Providers: SeriousBroker.it  This test is not yet approved or cleared by the [place] FDA and has been authorized for detection and/or diagnosis of SARS-CoV-2 by FDA under an Emergency Use Authorization (EUA). This EUA will remain in effect (meaning this test can be used) for the duration of the COVID-19 declaration under Section 564(b)(1) of the Act, 21 U.S.C. section 360bbb-3(b)(1), unless the authorization is terminated or revoked.     Resp Syncytial Virus by PCR NEGATIVE NEGATIVE Final    Comment: (NOTE) Fact Sheet for Patients: BloggerCourse.com  Fact Sheet for Healthcare Providers: SeriousBroker.it  This test is not yet approved or cleared by the [place] FDA and has been  authorized for detection and/or diagnosis of SARS-CoV-2 by FDA under an Emergency Use Authorization (EUA). This EUA will remain in effect (meaning this test can be used) for the duration of the COVID-19 declaration under Section 564(b)(1) of the Act, 21 U.S.C. section 360bbb-3(b)(1), unless the authorization is terminated or revoked.  Performed at [facility], [address] [place], [place] [address]     Coagulation Studies: No results for input(s): "LABPROT", "INR" in the last 72 hours.  Urinalysis: No results for input(s): "COLORURINE", "LABSPEC", "PHURINE", "GLUCOSEU", "HGBUR", "BILIRUBINUR", "KETONESUR", "PROTEINUR", "UROBILINOGEN", "NITRITE", "LEUKOCYTESUR" in the last 72 hours.  Invalid input(s): "APPERANCEUR"    Imaging: DG Chest Port 1 View  Result Date: 09/16/2023 CLINICAL DATA:  Questionable sepsis EXAM: PORTABLE CHEST 1 VIEW COMPARISON:  06/27/2023 FINDINGS: Generous heart size that is stable. Airway cuffing also seen on most recent CT. There is COPD with apical emphysema by CT. There is no edema, consolidation, effusion, or pneumothorax. IMPRESSION: Chronic cardiomegaly and airway thickening. No acute or focal finding. Electronically Signed   By: Tiburcio Pea M.D.   On: 09/16/2023 06:02     Medications:    sodium chloride     nitroGLYCERIN 100 mcg/min (09/16/23 0635)    carvedilol  25 mg Oral BID WC   Chlorhexidine Gluconate Cloth  6 each Topical Q0600   [START ON 09/17/2023] DULoxetine  60 mg Oral Daily   fluticasone furoate-vilanterol  1 puff Inhalation Daily   And   umeclidinium bromide  1 puff Inhalation Daily   heparin  5,000 Units Subcutaneous Q12H   hydrALAZINE  100 mg Oral Q8H   lidocaine-prilocaine  1 Application Topical Daily   nicotine  21 mg Transdermal Daily   pantoprazole (PROTONIX) IV  40 mg Intravenous Q24H   [START ON 09/17/2023] predniSONE  40 mg Oral Daily   sodium chloride flush  3 mL Intravenous Q12H   sodium chloride,  acetaminophen, albuterol, ondansetron (ZOFRAN) IV, sodium chloride flush  Assessment/ Plan:  [name] is a 57 y.o.  male with past medical conditions including COPD on home oxygen at 3 L, diabetes, struct of sleep apnea, anemia, hypertension, tobacco use, polysubstance abuse, CHF, end-stage renal disease on hemodialysis. Patient presents to emergency department with shortness of breath and has been admitted for Infestation by bed bug [B88.8] CHF (congestive heart failure) (HCC) [I50.9] Elevated troponin level [R79.89] ESRD (end stage renal disease) on dialysis (HCC) [N18.6, Z99.2] Acute and chronic respiratory failure with hypercapnia (HCC) [J96.22] Hypertensive emergency [I16.1]  [facility]/TTS/Lt AVF  End stage renal disease on dialysis. Last treatment received on Monday. Chest xray negative for acute findings. Will order dialysis for later today.  2. Acute respiratory failure due to missed dialysis treatment. Placed on Bipap in ED. Weaned to 3L.   3. Hypertensive emergency, Blood pressure 202/104. Placed on Nitroglycerin drip in ED. Elevated BP partially due to missed dialysis.   4. Diabetes mellitus type II with chronic kidney disease/renal manifestations:noninsulin dependent. Most recent hemoglobin A1c is 5.5 on 06/10/23.   5. Secondary Hyperparathyroidism: with outpatient labs: None  Lab Results  Component Value Date   PTH 135 (H) 12/07/2021   PTH Comment 12/07/2021   CALCIUM 8.2 (L) 09/16/2023   PHOS 4.2 03/10/2023    Will continue to monitor bone minerals during this admission.    LOS: 0   10/7/20241:37 PM

## 2023-09-16 NOTE — Progress Notes (Signed)
Waiting for call back to get report, SBAR reviewed

## 2023-09-16 NOTE — ED Notes (Signed)
MD notified of critical troponin of 121

## 2023-09-16 NOTE — H&P (Signed)
History and Physical    Derek S Radwan Jr. OZH:[id number] DOB: September 22, 1966 DOA: 09/16/2023  PCP: Larae Grooms, NP (Confirm with patient/family/NH records and if not entered, this has to be entered at [facility] point of entry) Patient coming from: Home  I have personally briefly reviewed patient's old medical records in [facility]  Chief Complaint: Shortness of breath, nosebleeding headache and chest pain  HPI: Derek Cooley. is a 57 y.o. male with medical history significant of refractory HTN noncompliant with medications, ESRD on HD TTS, brain aneurysm SAH status post clipping 2020, chronic HFpEF, COPD, OSA on CPAP, chronic amphetamine abuse, mild cognitive impairment secondary to previous stroke, presented with worsening of shortness of breath and headache and chest pains.  Patient has malfunctioning right IJ HD catheter about 2 weeks and has had intermittent HD.  8 days ago on 9/30, the IJ HD catheter was removed.  And patient reported he has missed his dialysis for 7+ days.  In addition, he mentioned that he has ran out of all his BP medications " for a while" increasingly he started to have shortness of breath, global headache and pressure-like chest pains associated with worsening of exertional dyspnea.  Develop orthopnea last night and came to ED. ED Course: Blood pressure 240/120, O2 saturation 98% on BiPAP.  ABG showed 7.2 1/72/48.  Blood work showed creatinine 7.3, BUN 79, bicarb 27, WBC 11, hemoglobin 11.7.  Troponin 121> 107  Patient was given multiple rounds of DuoNebs Solu-Medrol and started on nitroglycerin drip  Review of Systems: As per HPI otherwise 14 point review of systems negative.    Past Medical History:  Diagnosis Date   Abdominal abscess    a.) chronic MRSA infection   Acute pancreatitis    Anxiety    Asthma    Brain aneurysm    a.) 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 b.) RIGHT  crainotomy for clip ligation 05/14/2019.   CHF (congestive heart failure) (HCC)    Chronic back pain    Chronic heart failure with preserved ejection fraction (HFpEF) (HCC)    a. 11/2021 Echo: EF 55-60%, no rwma, mod LVH, GrI DD. Nl RV size/fxn. Mildly dil LA.   CKD (chronic kidney disease), stage V (HCC)    Depression    Emphysema of lung (HCC)    Erectile dysfunction    Followed by palliative care service    GERD (gastroesophageal reflux disease)    History of methicillin resistant staphylococcus aureus (MRSA)    HLD (hyperlipidemia)    Hypertension    Mild cognitive impairment    MRSA (methicillin resistant Staphylococcus aureus)    Obesity    OSA (obstructive sleep apnea) 2013   a.) not compliant with nocturnal PAP therapy; CPAP machine "lost or stolen".   Panic disorder    Perforated bowel (HCC) 12/10/2005   Tempoary Colostomy Bag, Skin Graft for Abd wound   Polysubstance abuse (HCC)    a.) ETOH, tobacco, marijuana, methamphetamines, cocaine, BZO, opioids.   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   T2DM (type 2 diabetes mellitus) (HCC)    Tobacco abuse    Type 2 diabetes mellitus without complication, without long-term current use of insulin (HCC) 04/17/2016   Vitreous hemorrhage (HCC) 03/27/2011   Overview:  Bilateral; 01/2010, from [facility]     Past Surgical History:  Procedure  Laterality Date   A/V FISTULAGRAM Left 07/16/2022   Procedure: A/V Fistulagram;  Surgeon: Annice Needy, MD;  Location: [facility] INVASIVE CV [facility];  Service: Cardiovascular;  Laterality: Left;   AV FISTULA PLACEMENT Left 05/24/2022   Procedure: ARTERIOVENOUS (AV) FISTULA CREATION ( RADIAL CEPHALIC);  Surgeon: Annice Needy, MD;  Location: [facility] ORS;  Service: Vascular;  Laterality: Left;   CEREBRAL ANEURYSM REPAIR Right 01/26/2010   Procedure: CEREBRAL ANEURYSM REPAIR (COIL EMBOLIZATION)    CEREBRAL ANEURYSM REPAIR Right 05/14/2019   Procedure: CRAINOTOMY FOR CEREBRAL ANEURYSM REPAIR (CLIP LIGATION)   COLON SURGERY  12/10/2005   colostomy bag placed s/p perforated bowel   COLONOSCOPY WITH PROPOFOL N/A 05/18/2020   Procedure: COLONOSCOPY WITH PROPOFOL;  Surgeon: Toney Reil, MD;  Location: [facility] ENDOSCOPY;  Service: Gastroenterology;  Laterality: N/A;   DIALYSIS/PERMA CATHETER INSERTION N/A 06/27/2022   Procedure: DIALYSIS/PERMA CATHETER INSERTION;  Surgeon: Renford Dills, MD;  Location: [facility];  Service: Cardiovascular;  Laterality: N/A;   DIALYSIS/PERMA CATHETER INSERTION N/A 06/28/2023   Procedure: DIALYSIS/PERMA CATHETER INSERTION;  Surgeon: Renford Dills, MD;  Location: [facility];  Service: Cardiovascular;  Laterality: N/A;   DIALYSIS/PERMA CATHETER REMOVAL N/A 05/20/2023   Procedure: DIALYSIS/PERMA CATHETER REMOVAL;  Surgeon: Annice Needy, MD;  Location: [facility];  Service: Cardiovascular;  Laterality: N/A;   DIALYSIS/PERMA CATHETER REMOVAL N/A 09/09/2023   Procedure: DIALYSIS/PERMA CATHETER REMOVAL;  Surgeon: Annice Needy, MD;  Location: [facility];  Service: Cardiovascular;  Laterality: N/A;   KNEE SURGERY Right    Perforated bowel       reports that he has been smoking cigarettes. He started smoking about 38 years ago. He has a 40 pack-year smoking history. He has never used smokeless tobacco. He reports current alcohol use. He reports that he does not currently use drugs after having used the following drugs: "Crack" cocaine, Amphetamines, Methamphetamines, Benzodiazepines, Cocaine, Marijuana, and Other-see comments.  Allergies  Allergen Reactions   Atorvastatin     Joint Aches - Severe Joint Aches - Severe Joint Aches - Severe   Avelox [Moxifloxacin Hcl In Nacl]     Muscle pain   Buprenorphine     Mouth sores, confusion, shaking   Dilaudid [Hydromorphone Hcl] Hives   Fluoxetine Itching   Levofloxacin  Other (See Comments)    Joint Pain   Morphine And Codeine    Other     Muscle pain   Suboxone [Buprenorphine Hcl-Naloxone Hcl] Other (See Comments)    Rash and confused   Vancomycin     Renal insufficiency    Family History  Problem Relation Age of Onset   Arthritis Mother    Asthma Mother    Diabetes Mother    Heart disease Mother    Hyperlipidemia Mother    Hypertension Mother    Kidney disease Mother    Thyroid disease Mother    Lung disease Mother    Anxiety disorder Mother    Depression Mother    Diabetes Father    Heart disease Father    Depression Father    Anxiety disorder Father    Arthritis Sister    Asthma Sister    Hyperlipidemia Sister    Hypertension Sister    Lung disease Sister    Anxiety disorder Sister    Depression Sister    Hyperlipidemia Brother    Hypertension Brother    Diabetes Sister    Heart disease Sister    Depression  Sister    Anxiety disorder Sister    Anxiety disorder Brother    Depression Brother    Heart disease Brother      Prior to Admission medications   Medication Sig Start Date End Date Taking? Authorizing Provider  albuterol (VENTOLIN HFA) 108 (90 Base) MCG/ACT inhaler Inhale 1-2 puffs into the lungs every 6 (six) hours as needed for wheezing or shortness of breath. Patient not taking: Reported on 09/16/2023 06/10/23   Mecum, Erin E, PA-C  carvedilol (COREG) 25 MG tablet Take 25 mg by mouth 2 (two) times daily with a meal. Patient not taking: Reported on 09/16/2023    [provider]  DULoxetine (CYMBALTA) 60 MG capsule Take 1 capsule (60 mg total) by mouth daily. 11/24/22 06/27/23  Pokhrel, Rebekah Chesterfield, MD  glucose blood (ONETOUCH ULTRA) test strip USE TO TEST BLOOD SUGAR DAILY Patient not taking: Reported on 09/16/2023 08/23/20   Valentino Nose, NP  hydrALAZINE (APRESOLINE) 100 MG tablet Take 100 mg by mouth 2 (two) times daily. Patient not taking: Reported on 09/16/2023    [provider]   lidocaine-prilocaine (EMLA) cream Apply 1 Application topically daily. Patient not taking: Reported on 09/16/2023 08/23/22   [provider]  losartan (COZAAR) 25 MG tablet Take 1 tablet (25 mg total) by mouth daily. Patient not taking: Reported on 09/16/2023 06/19/23   Aura Dials T, NP  OXYGEN Inhale 3 L into the lungs daily.    [provider]  pantoprazole (PROTONIX) 40 MG tablet TAKE 1 TABLET(40 MG) BY MOUTH TWICE DAILY AS NEEDED Patient not taking: Reported on 09/16/2023 01/23/23   Larae Grooms, NP  topiramate (TOPAMAX) 50 MG tablet TAKE 1 TABLET(50 MG) BY MOUTH TWICE DAILY Patient not taking: Reported on 09/16/2023 01/07/23   Larae Grooms, NP  torsemide (DEMADEX) 5 MG tablet Take 1 tablet (5 mg total) by mouth daily. Hold this on dialysis days. Patient not taking: Reported on 09/16/2023 06/19/23   Aura Dials T, NP  TRELEGY ELLIPTA 100-62.5-25 MCG/ACT AEPB Inhale 1 puff into the lungs daily. Patient not taking: Reported on 09/16/2023 06/10/23   Roselind Messier    Physical Exam: Vitals:   09/16/23 0700 09/16/23 0715 09/16/23 0745 09/16/23 0800  BP: (!) 197/94 (!) 189/95 (!) 198/99 (!) 194/101  Pulse: 80 83 74 77  Resp: (!) 22 20 20 20   Temp:      TempSrc:      SpO2: 98% 100% 100% 99%  Height:        Constitutional: NAD, calm, comfortable Vitals:   09/16/23 0700 09/16/23 0715 09/16/23 0745 09/16/23 0800  BP: (!) 197/94 (!) 189/95 (!) 198/99 (!) 194/101  Pulse: 80 83 74 77  Resp: (!) 22 20 20 20   Temp:      TempSrc:      SpO2: 98% 100% 100% 99%  Height:       Eyes: PERRL, lids and conjunctivae normal ENMT: Mucous membranes are moist. Posterior pharynx clear of any exudate or lesions.Normal dentition.  Neck: normal, supple, no masses, no thyromegaly.  JVD 6 cm above clavicle Respiratory: clear to auscultation bilaterally, scattered wheezing, fine crackles on bilateral lower fields, increasing l respiratory effort. No accessory muscle use.   Cardiovascular: Regular rate and rhythm, no murmurs / rubs / gallops. No extremity edema. 2+ pedal pulses. No carotid bruits.  Abdomen: no tenderness, no masses palpated. No hepatosplenomegaly. Bowel sounds positive.  Musculoskeletal: no clubbing / cyanosis. No joint deformity upper and lower extremities. Good ROM,  no contractures. Normal muscle tone.  Skin: no rashes, lesions, ulcers. No induration Neurologic: CN 2-12 grossly intact. Sensation intact, DTR normal. Strength 5/5 in all 4.  Psychiatric: Normal judgment and insight. Alert and oriented x 3. Normal mood.     Labs on Admission: I have personally reviewed following labs and imaging studies  CBC: Recent Labs  Lab 09/16/23 0530  WBC 11.0*  NEUTROABS 9.0*  HGB 11.7*  HCT 34.0*  MCV 97.4  PLT 226   Basic Metabolic Panel: Recent Labs  Lab 09/16/23 0530  NA 144  K 4.7  CL 104  CO2 27  GLUCOSE 133*  BUN 79*  CREATININE 7.32*  CALCIUM 8.2*   GFR: Estimated Creatinine Clearance: 12.6 mL/min (A) (by C-G formula based on SCr of 7.32 mg/dL (H)). Liver Function Tests: Recent Labs  Lab 09/16/23 0530  AST 19  ALT 19  ALKPHOS 96  BILITOT 1.0  PROT 7.4  ALBUMIN 4.0   No results for input(s): "LIPASE", "AMYLASE" in the last 168 hours. No results for input(s): "AMMONIA" in the last 168 hours. Coagulation Profile: No results for input(s): "INR", "PROTIME" in the last 168 hours. Cardiac Enzymes: No results for input(s): "CKTOTAL", "CKMB", "CKMBINDEX", "TROPONINI" in the last 168 hours. BNP (last 3 results) No results for input(s): "PROBNP" in the last 8760 hours. HbA1C: No results for input(s): "HGBA1C" in the last 72 hours. CBG: No results for input(s): "GLUCAP" in the last 168 hours. Lipid Profile: No results for input(s): "CHOL", "HDL", "LDLCALC", "TRIG", "CHOLHDL", "LDLDIRECT" in the last 72 hours. Thyroid Function Tests: No results for input(s): "TSH", "T4TOTAL", "FREET4", "T3FREE", "THYROIDAB" in the last 72  hours. Anemia Panel: No results for input(s): "VITAMINB12", "FOLATE", "FERRITIN", "TIBC", "IRON", "RETICCTPCT" in the last 72 hours. Urine analysis:    Component Value Date/Time   COLORURINE YELLOW (A) 06/11/2023 1305   APPEARANCEUR HAZY (A) 06/11/2023 1305   APPEARANCEUR Clear 04/17/2021 1544   LABSPEC 1.013 06/11/2023 1305   LABSPEC 1.029 06/26/2012 1231   PHURINE 5.0 06/11/2023 1305   GLUCOSEU NEGATIVE 06/11/2023 1305   GLUCOSEU Negative 06/26/2012 1231   HGBUR NEGATIVE 06/11/2023 1305   BILIRUBINUR NEGATIVE 06/11/2023 1305   BILIRUBINUR Negative 04/17/2021 1544   BILIRUBINUR Negative 06/26/2012 1231   KETONESUR NEGATIVE 06/11/2023 1305   PROTEINUR >=300 (A) 06/11/2023 1305   NITRITE NEGATIVE 06/11/2023 1305   LEUKOCYTESUR NEGATIVE 06/11/2023 1305   LEUKOCYTESUR Negative 06/26/2012 1231    Radiological Exams on Admission: DG Chest Port 1 View  Result Date: 09/16/2023 CLINICAL DATA:  Questionable sepsis EXAM: PORTABLE CHEST 1 VIEW COMPARISON:  06/27/2023 FINDINGS: Generous heart size that is stable. Airway cuffing also seen on most recent CT. There is COPD with apical emphysema by CT. There is no edema, consolidation, effusion, or pneumothorax. IMPRESSION: Chronic cardiomegaly and airway thickening. No acute or focal finding. Electronically Signed   By: Tiburcio Pea M.D.   On: 09/16/2023 06:02    EKG: Independently reviewed.  Sinus rhythm, similar ST morphology V2 and V3 as before  Assessment/Plan Principal Problem:   CHF (congestive heart failure) (HCC) Active Problems:   ESRD on hemodialysis (HCC)   Chronic obstructive pulmonary disease (COPD) (HCC)   Malignant HTN with heart disease, w/o CHF, 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   Chronic heart failure with preserved ejection fraction (HFpEF) (HCC)  (please populate well all problems here in Problem List. (For example, if patient is on BP meds at home and you resume or decide to hold them, it is a problem  that needs to be her.  Same for CAD, COPD, HLD and so on)  Acute on chronic HFpEF decompensation -Secondary to HTN emergency and missed dialysis -Patient is on BiPAP, unable to resume p.o. BP meds, for now we will give 1 dose of IV labetalol, and start as needed hydralazine, to titrate down/off the nitroglycerin drip hopefully by this afternoon.  Admit to stepdown unit to titrate nitroglycerin drip -1 dose of IV Lasix given -Emergency dialysis as per nephrology  HTN emergency -With endorgan damage of acute CHF as well as likely acute metabolic encephalopathy -As patient has a history of SAH due to hypertension before in 2020, will order a stat CT head. -BP management as above  Acute COPD exacerbation versus cardiac wheezing -Blood pressure as above -For COPD exacerbation patient received IV Solu-Medrol, will start p.o. steroid and continue ICS, LABA and S ABA  Acute on chronic hypoxic and hypercapnic respiratory failure -Factorial from COPD exacerbation but mainly likely from CHF decompensation and HTN emergency, management as above  Elevated troponins -Trending is flat, likely secondary to demand ischemia from HTN emergency.  Patient does have a normal stress test in July 2023.  Amphetamine abuse -On Coreg once able to take p.o. medications  OSA -CPAP at bedtime  Bedbug infestation -Claims that he lives " with a friend".  [place] highly suspect patient is homeless. -Send case manager to talk to patient as well as stress medication refill issues.  DVT prophylaxis: Heparin subcu Code Status: Full code Family Communication: None at bedside Disposition Plan: The patient is sick with CHF decompensation and HTN emergency requiring emergency dialysis, expect blood control and CHF treatment last 4 more than 2 midnight hospital stay. Consults called: Nephrology Admission status: Stepdown admission  Emeline General MD [facility] Pager [phone]  09/16/2023, 9:29 AM
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
## 2023-09-16 NOTE — ED Notes (Signed)
ED TO INPATIENT HANDOFF REPORT  ED Nurse Name and Phone #: Leonette Nutting  S Name/Age/Gender Derek Cooley. 57 y.o. male Room/Bed: ED09A/ED09A  Code Status   Code Status: Full Code  Home/SNF/Other Home Patient oriented to: self, place, time, and situation Is this baseline? Yes   Triage Complete: Triage complete  Chief Complaint CHF (congestive heart failure) (HCC) [I50.9]  Triage Note Pt arrives with reports of shortness of breath, diarrhea, headache. Pt reports nose bleed tonight that lasted 2 hours prior to arrival. Pt on dialysis and has missed the last two sessions due to feeling weak. Pt goes on tues, thurs, Saturday. Pt wears oxygen at baseline.   Allergies Allergies  Allergen Reactions   Atorvastatin     Joint Aches - Severe Joint Aches - Severe Joint Aches - Severe   Avelox [Moxifloxacin Hcl In Nacl]     Muscle pain   Buprenorphine     Mouth sores, confusion, shaking   Dilaudid [Hydromorphone Hcl] Hives   Fluoxetine Itching   Levofloxacin Other (See Comments)    Joint Pain   Morphine And Codeine    Other     Muscle pain   Suboxone [Buprenorphine Hcl-Naloxone Hcl] Other (See Comments)    Rash and confused   Vancomycin     Renal insufficiency    Level of Care/Admitting Diagnosis ED Disposition     ED Disposition  Admit   Condition  --   Comment  Hospital Area: [facility] [[id number]]  Level of Care: Stepdown [14]  Covid Evaluation: Confirmed COVID Negative  Diagnosis: CHF (congestive heart failure) [facility]) [[id number]]  Admitting Physician: Emeline General [[id number]]  Attending Physician: Emeline General [[id number]]  Certification:: I certify this patient will need inpatient services for at least 2 midnights  Expected Medical Readiness: 09/18/2023          B Medical/Surgery History Past Medical History:  Diagnosis Date   Abdominal abscess    a.) chronic MRSA infection   Acute pancreatitis    Anxiety    Asthma    Brain  aneurysm    a.) 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 b.) RIGHT crainotomy for clip ligation 05/14/2019.   CHF (congestive heart failure) (HCC)    Chronic back pain    Chronic heart failure with preserved ejection fraction (HFpEF) (HCC)    a. 11/2021 Echo: EF 55-60%, no rwma, mod LVH, GrI DD. Nl RV size/fxn. Mildly dil LA.   CKD (chronic kidney disease), stage V (HCC)    Depression    Emphysema of lung (HCC)    Erectile dysfunction    Followed by palliative care service    GERD (gastroesophageal reflux disease)    History of methicillin resistant staphylococcus aureus (MRSA)    HLD (hyperlipidemia)    Hypertension    Mild cognitive impairment    MRSA (methicillin resistant Staphylococcus aureus)    Obesity    OSA (obstructive sleep apnea) 2013   a.) not compliant with nocturnal PAP therapy; CPAP machine "lost or stolen".   Panic disorder    Perforated bowel (HCC) 12/10/2005   Tempoary Colostomy Bag, Skin Graft for Abd wound   Polysubstance abuse (HCC)    a.) ETOH, tobacco, marijuana, methamphetamines, cocaine, BZO, opioids.   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   T2DM (type 2 diabetes mellitus) (HCC)  Tobacco abuse    Type 2 diabetes mellitus without complication, without long-term current use of insulin (HCC) 04/17/2016   Vitreous hemorrhage (HCC) 03/27/2011   Overview:  Bilateral; 01/2010, from [facility]    Past Surgical History:  Procedure Laterality Date   A/V FISTULAGRAM Left 07/16/2022   Procedure: A/V Fistulagram;  Surgeon: Annice Needy, MD;  Location: [facility] INVASIVE CV LAB;  Service: Cardiovascular;  Laterality: Left;   AV FISTULA PLACEMENT Left 05/24/2022   Procedure: ARTERIOVENOUS (AV) FISTULA CREATION ( RADIAL CEPHALIC);  Surgeon: Annice Needy, MD;  Location: [facility] ORS;  Service:  Vascular;  Laterality: Left;   CEREBRAL ANEURYSM REPAIR Right 01/26/2010   Procedure: CEREBRAL ANEURYSM REPAIR (COIL EMBOLIZATION)   CEREBRAL ANEURYSM REPAIR Right 05/14/2019   Procedure: CRAINOTOMY FOR CEREBRAL ANEURYSM REPAIR (CLIP LIGATION)   COLON SURGERY  12/10/2005   colostomy bag placed s/p perforated bowel   COLONOSCOPY WITH PROPOFOL N/A 05/18/2020   Procedure: COLONOSCOPY WITH PROPOFOL;  Surgeon: Toney Reil, MD;  Location: [facility] ENDOSCOPY;  Service: Gastroenterology;  Laterality: N/A;   DIALYSIS/PERMA CATHETER INSERTION N/A 06/27/2022   Procedure: DIALYSIS/PERMA CATHETER INSERTION;  Surgeon: Renford Dills, MD;  Location: [facility] CV [facility];  Service: Cardiovascular;  Laterality: N/A;   DIALYSIS/PERMA CATHETER INSERTION N/A 06/28/2023   Procedure: DIALYSIS/PERMA CATHETER INSERTION;  Surgeon: Renford Dills, MD;  Location: [facility];  Service: Cardiovascular;  Laterality: N/A;   DIALYSIS/PERMA CATHETER REMOVAL N/A 05/20/2023   Procedure: DIALYSIS/PERMA CATHETER REMOVAL;  Surgeon: Annice Needy, MD;  Location: [facility];  Service: Cardiovascular;  Laterality: N/A;   DIALYSIS/PERMA CATHETER REMOVAL N/A 09/09/2023   Procedure: DIALYSIS/PERMA CATHETER REMOVAL;  Surgeon: Annice Needy, MD;  Location: [facility];  Service: Cardiovascular;  Laterality: N/A;   KNEE SURGERY Right    Perforated bowel       A IV Location/Drains/Wounds Patient Lines/Drains/Airways Status     Active Line/Drains/Airways     Name Placement date Placement time Site Days   Peripheral IV 09/16/23 20 G Anterior;Distal;Right;Upper Arm 09/16/23  0631  Arm  less than 1   Peripheral IV 09/16/23 22 G Anterior;Right Forearm 09/16/23  0753  Forearm  less than 1   Fistula / Graft Left Forearm Arteriovenous fistula 06/23/22  0000  Forearm  450            Intake/Output Last 24 hours  Intake/Output Summary (Last 24 hours) at 09/16/2023 1151 Last data filed at 09/16/2023  1148 Gross per 24 hour  Intake --  Output 325 ml  Net -325 ml    Labs/Imaging Results for orders placed or performed during the hospital encounter of 09/16/23 (from the past 48 hour(s))  Lactic acid, plasma     Status: None   Collection Time: 09/16/23  5:30 AM  Result Value Ref Range   Lactic Acid, Venous 0.6 0.5 - 1.9 mmol/L    Comment: Performed at [facility], [address] [place], [place] [address]  Comprehensive metabolic panel     Status: Abnormal   Collection Time: 09/16/23  5:30 AM  Result Value Ref Range   Sodium 144 135 - 145 mmol/L   Potassium 4.7 3.5 - 5.1 mmol/L   Chloride 104 98 - 111 mmol/L   CO2 27 22 - 32 mmol/L   Glucose, Bld 133 (H) 70 - 99 mg/dL    Comment: Glucose reference range applies only to samples taken after fasting for at least 8 hours.   BUN 79 (  H) 6 - 20 mg/dL   Creatinine, Ser 2.13 (H) 0.61 - 1.24 mg/dL   Calcium 8.2 (L) 8.9 - 10.3 mg/dL   Total Protein 7.4 6.5 - 8.1 g/dL   Albumin 4.0 3.5 - 5.0 g/dL   AST 19 15 - 41 U/L   ALT 19 0 - 44 U/L   Alkaline Phosphatase 96 38 - 126 U/L   Total Bilirubin 1.0 0.3 - 1.2 mg/dL   GFR, Estimated 8 (L) >60 mL/min    Comment: (NOTE) Calculated using the CKD-EPI Creatinine Equation (2021)    Anion gap 13 5 - 15    Comment: Performed at [facility], [address] [place], [place] [address]  CBC with Differential     Status: Abnormal   Collection Time: 09/16/23  5:30 AM  Result Value Ref Range   WBC 11.0 (H) 4.0 - 10.5 K/uL   RBC 3.49 (L) 4.22 - 5.81 MIL/uL   Hemoglobin 11.7 (L) 13.0 - 17.0 g/dL   HCT 84.6 (L) 96.2 - 95.2 %   MCV 97.4 80.0 - 100.0 fL   MCH 33.5 26.0 - 34.0 pg   MCHC 34.4 30.0 - 36.0 g/dL   RDW 84.1 32.4 - 40.1 %   Platelets 226 150 - 400 K/uL   nRBC 0.0 0.0 - 0.2 %   Neutrophils Relative % 81 %   Neutro Abs 9.0 (H) 1.7 - 7.7 K/uL   Lymphocytes Relative 7 %   Lymphs Abs 0.8 0.7 - 4.0 K/uL   Monocytes Relative 5 %   Monocytes Absolute 0.6 0.1 - 1.0 K/uL    Eosinophils Relative 5 %   Eosinophils Absolute 0.5 0.0 - 0.5 K/uL   Basophils Relative 1 %   Basophils Absolute 0.1 0.0 - 0.1 K/uL   Immature Granulocytes 1 %   Abs Immature Granulocytes 0.10 (H) 0.00 - 0.07 K/uL    Comment: Performed at [facility], [address] [place], [place] [address]  Brain natriuretic peptide     Status: Abnormal   Collection Time: 09/16/23  5:30 AM  Result Value Ref Range   B Natriuretic Peptide 633.4 (H) 0.0 - 100.0 pg/mL    Comment: Performed at [facility], [address] [place], [place] [address]  Troponin I (High Sensitivity)     Status: Abnormal   Collection Time: 09/16/23  5:30 AM  Result Value Ref Range   Troponin I (High Sensitivity) 121 (HH) <18 ng/L    Comment: CRITICAL RESULT CALLED TO, READ BACK BY AND VERIFIED WITH TRAVIS DEETH RN [phone] 09/16/23 HNM (NOTE) Elevated high sensitivity troponin I (hsTnI) values and significant  changes across serial measurements may suggest ACS but many other  chronic and acute conditions are known to elevate hsTnI results.  Refer to the "Links" section for chest pain algorithms and additional  guidance. Performed at [facility], [address] [place], [place] [address]   Resp panel by RT-PCR (RSV, Flu A&B, Covid) Anterior Nasal Swab     Status: None   Collection Time: 09/16/23  5:30 AM   Specimen: Anterior Nasal Swab  Result Value Ref Range   SARS Coronavirus 2 by RT PCR NEGATIVE NEGATIVE    Comment: (NOTE) SARS-CoV-2 target nucleic acids are NOT DETECTED.  The SARS-CoV-2 RNA is generally detectable in upper respiratory specimens during the acute phase of infection. The lowest concentration of SARS-CoV-2 viral copies this assay can detect is 138 copies/mL. A negative result does not preclude SARS-Cov-2 infection and  should not be used as the sole basis for treatment or other patient management decisions. A negative result may occur with  improper specimen  collection/handling, submission of specimen other than nasopharyngeal swab, presence of viral mutation(s) within the areas targeted by this assay, and inadequate number of viral copies(<138 copies/mL). A negative result must be combined with clinical observations, patient history, and epidemiological information. The expected result is Negative.  Fact Sheet for Patients:  BloggerCourse.com  Fact Sheet for Healthcare Providers:  SeriousBroker.it  This test is no t yet approved or cleared by the [place] FDA and  has been authorized for detection and/or diagnosis of SARS-CoV-2 by FDA under an Emergency Use Authorization (EUA). This EUA will remain  in effect (meaning this test can be used) for the duration of the COVID-19 declaration under Section 564(b)(1) of the Act, 21 U.S.C.section 360bbb-3(b)(1), unless the authorization is terminated  or revoked sooner.       Influenza A by PCR NEGATIVE NEGATIVE   Influenza B by PCR NEGATIVE NEGATIVE    Comment: (NOTE) The Xpert Xpress SARS-CoV-2/FLU/RSV plus assay is intended as an aid in the diagnosis of influenza from Nasopharyngeal swab specimens and should not be used as a sole basis for treatment. Nasal washings and aspirates are unacceptable for Xpert Xpress SARS-CoV-2/FLU/RSV testing.  Fact Sheet for Patients: BloggerCourse.com  Fact Sheet for Healthcare Providers: SeriousBroker.it  This test is not yet approved or cleared by the [place] FDA and has been authorized for detection and/or diagnosis of SARS-CoV-2 by FDA under an Emergency Use Authorization (EUA). This EUA will remain in effect (meaning this test can be used) for the duration of the COVID-19 declaration under Section 564(b)(1) of the Act, 21 U.S.C. section 360bbb-3(b)(1), unless the authorization is terminated or revoked.     Resp Syncytial Virus by PCR  NEGATIVE NEGATIVE    Comment: (NOTE) Fact Sheet for Patients: BloggerCourse.com  Fact Sheet for Healthcare Providers: SeriousBroker.it  This test is not yet approved or cleared by the [place] FDA and has been authorized for detection and/or diagnosis of SARS-CoV-2 by FDA under an Emergency Use Authorization (EUA). This EUA will remain in effect (meaning this test can be used) for the duration of the COVID-19 declaration under Section 564(b)(1) of the Act, 21 U.S.C. section 360bbb-3(b)(1), unless the authorization is terminated or revoked.  Performed at [facility], [address] [place], [place] [address]   Blood gas, venous     Status: Abnormal   Collection Time: 09/16/23  5:32 AM  Result Value Ref Range   pH, Ven 7.21 (L) 7.25 - 7.43   pCO2, Ven 72 (HH) 44 - 60 mmHg    Comment: CRITICAL RESULT CALLED TO, READ BACK BY AND VERIFIED WITH: CORY FORBACH [id number] 0600 DT    pO2, Ven 48 (H) 32 - 45 mmHg   Bicarbonate 28.8 (H) 20.0 - 28.0 mmol/L   Acid-base deficit 0.9 0.0 - 2.0 mmol/L   O2 Saturation 77.9 %   Patient temperature 37.0    Collection site VEIN     Comment: Performed at [facility], [address] [place], [place] [address]  Troponin I (High Sensitivity)     Status: Abnormal   Collection Time: 09/16/23  7:15 AM  Result Value Ref Range   Troponin I (High Sensitivity) 107 (HH) <18 ng/L    Comment: CRITICAL VALUE NOTED. VALUE IS CONSISTENT WITH PREVIOUSLY REPORTED/CALLED VALUE  SH (NOTE) Elevated high sensitivity troponin I (hsTnI) values and significant  changes across serial measurements may suggest ACS but many other  chronic and acute conditions are known to elevate hsTnI results.  Refer to the "Links" section for chest pain algorithms and additional  guidance. Performed at [facility], [address] [place], [place] [address]    DG Chest Port 1 View  Result Date:  09/16/2023 CLINICAL DATA:  Questionable sepsis EXAM: PORTABLE CHEST 1 VIEW COMPARISON:  06/27/2023 FINDINGS: Generous heart size that is stable. Airway cuffing also seen on most recent CT. There is COPD with apical emphysema by CT. There is no edema, consolidation, effusion, or pneumothorax. IMPRESSION: Chronic cardiomegaly and airway thickening. No acute or focal finding. Electronically Signed   By: Tiburcio Pea M.D.   On: 09/16/2023 06:02    Pending Labs Unresulted Labs (From admission, onward)     Start     Ordered   09/17/23 0500  Basic metabolic panel  Daily,   R     Comments: As Scheduled for 5 days    09/16/23 0845   09/16/23 0704  Hepatitis B surface antigen  (New Admission Hemo Labs (Hepatitis B))  Once,   URGENT        09/16/23 0705   09/16/23 0704  Hepatitis B surface antibody,quantitative  (New Admission Hemo Labs (Hepatitis B))  Once,   URGENT        09/16/23 0705   Signed and Held  Renal function panel  Once,   R        Signed and Held   Signed and Held  CBC  Once,   R        Signed and Held            Vitals/Pain Today's Vitals   09/16/23 1030 09/16/23 1045 09/16/23 1100 09/16/23 1115  BP: (!) 221/114 (!) 186/147 (!) 213/96 (!) 201/124  Pulse: 70 72 73 93  Resp: 14 17 19  (!) 21  Temp:      TempSrc:      SpO2: 100% 100% 100% (!) 86%  Height:      PainSc:        Isolation Precautions Contact precautions  Medications Medications  nitroGLYCERIN 50 mg in dextrose 5 % 250 mL (0.2 mg/mL) infusion (100 mcg/min Intravenous New Bag/Given 09/16/23 0635)  Chlorhexidine Gluconate Cloth 2 % PADS 6 each (has no administration in time range)  albuterol (PROVENTIL) (2.5 MG/3ML) 0.083% nebulizer solution 3 mL (has no administration in time range)  fluticasone furoate-vilanterol (BREO ELLIPTA) 100-25 MCG/ACT 1 puff (1 puff Inhalation Given 09/16/23 0951)    And  umeclidinium bromide (INCRUSE ELLIPTA) 62.5 MCG/ACT 1 puff (1 puff Inhalation Given 09/16/23 0952)   lidocaine-prilocaine (EMLA) cream 1 Application (has no administration in time range)  sodium chloride flush (NS) 0.9 % injection 3 mL (has no administration in time range)  sodium chloride flush (NS) 0.9 % injection 3 mL (has no administration in time range)  0.9 %  sodium chloride infusion (has no administration in time range)  acetaminophen (TYLENOL) tablet 650 mg (has no administration in time range)  ondansetron (ZOFRAN) injection 4 mg (has no administration in time range)  heparin injection 5,000 Units (5,000 Units Subcutaneous Given 09/16/23 0955)  nicotine (NICODERM CQ - dosed in mg/24 hours) patch 21 mg (21 mg Transdermal Patch Applied 09/16/23 0950)  pantoprazole (PROTONIX) injection 40 mg (40 mg Intravenous Given 09/16/23 1108)  DULoxetine (CYMBALTA) DR capsule 60 mg (has no administration in time range)  predniSONE (DELTASONE) tablet 40 mg (has  no administration in time range)  carvedilol (COREG) tablet 25 mg (25 mg Oral Given 09/16/23 1107)  hydrALAZINE (APRESOLINE) tablet 100 mg (has no administration in time range)  acetaminophen (TYLENOL) tablet 1,000 mg (1,000 mg Oral Given 09/16/23 0629)  ipratropium-albuterol (DUONEB) 0.5-2.5 (3) MG/3ML nebulizer solution 3 mL (3 mLs Nebulization Given 09/16/23 0630)  ipratropium-albuterol (DUONEB) 0.5-2.5 (3) MG/3ML nebulizer solution 3 mL (3 mLs Nebulization Given 09/16/23 0630)  ipratropium-albuterol (DUONEB) 0.5-2.5 (3) MG/3ML nebulizer solution 3 mL (3 mLs Nebulization Given 09/16/23 0630)  methylPREDNISolone sodium succinate (SOLU-MEDROL) 125 mg/2 mL injection 60 mg (60 mg Intravenous Given 09/16/23 0631)  furosemide (LASIX) 120 mg in dextrose 5 % 50 mL IVPB (0 mg Intravenous Stopped 09/16/23 1107)    Mobility walks     Focused Assessments Renal Assessment Handoff:  Hemodialysis Schedule: Hemodialysis Schedule: Tuesday/Thursday/Saturday Last Hemodialysis date and time: 09/10/23   Restricted appendage: left arm  , Pulmonary Assessment  Handoff:  Lung sounds: Bilateral Breath Sounds: Coarse crackles O2 Device: (S) Nasal Cannula O2 Flow Rate (L/min): 3 L/min    R Recommendations: See Admitting Provider Note  Report given to:   Additional Notes: has bed bugs

## 2023-09-16 NOTE — Plan of Care (Signed)

## 2023-09-16 NOTE — ED Provider Notes (Signed)
Patient accepted to Hospitalist service, tolerating Bipap well. Alert, awake.   Shaune Pollack, MD 09/16/23 1209

## 2023-09-16 NOTE — Progress Notes (Signed)
Bipap declined for at bedtime use for OSA; prn standing order for resp. distress/ CHFe. Device not in room at this time, but will be provided if needed.

## 2023-09-16 NOTE — ED Triage Notes (Signed)
Pt arrives with reports of shortness of breath, diarrhea, headache. Pt reports nose bleed tonight that lasted 2 hours prior to arrival. Pt on dialysis and has missed the last two sessions due to feeling weak. Pt goes on tues, thurs, Saturday. Pt wears oxygen at baseline.

## 2023-09-17 DIAGNOSIS — J9621 Acute and chronic respiratory failure with hypoxia: Secondary | ICD-10-CM | POA: Diagnosis not present

## 2023-09-17 DIAGNOSIS — I119 Hypertensive heart disease without heart failure: Secondary | ICD-10-CM

## 2023-09-17 DIAGNOSIS — Z992 Dependence on renal dialysis: Secondary | ICD-10-CM

## 2023-09-17 DIAGNOSIS — J439 Emphysema, unspecified: Secondary | ICD-10-CM | POA: Diagnosis not present

## 2023-09-17 DIAGNOSIS — I2489 Other forms of acute ischemic heart disease: Secondary | ICD-10-CM

## 2023-09-17 DIAGNOSIS — N186 End stage renal disease: Secondary | ICD-10-CM

## 2023-09-17 DIAGNOSIS — E669 Obesity, unspecified: Secondary | ICD-10-CM

## 2023-09-17 LAB — RENAL FUNCTION PANEL
Albumin: 3.3 g/dL — ABNORMAL LOW (ref 3.5–5.0)
Anion gap: 11 (ref 5–15)
BUN: 95 mg/dL — ABNORMAL HIGH (ref 6–20)
CO2: 22 mmol/L (ref 22–32)
Calcium: 7.8 mg/dL — ABNORMAL LOW (ref 8.9–10.3)
Chloride: 101 mmol/L (ref 98–111)
Creatinine, Ser: 7.9 mg/dL — ABNORMAL HIGH (ref 0.61–1.24)
GFR, Estimated: 7 mL/min — ABNORMAL LOW (ref >60)
Glucose, Bld: 222 mg/dL — ABNORMAL HIGH (ref 70–99)
Phosphorus: 6.9 mg/dL — ABNORMAL HIGH (ref 2.5–4.6)
Potassium: 4.5 mmol/L (ref 3.5–5.1)
Sodium: 134 mmol/L — ABNORMAL LOW (ref 135–145)

## 2023-09-17 LAB — BASIC METABOLIC PANEL
Anion gap: 12 (ref 5–15)
BUN: 94 mg/dL — ABNORMAL HIGH (ref 6–20)
CO2: 23 mmol/L (ref 22–32)
Calcium: 8 mg/dL — ABNORMAL LOW (ref 8.9–10.3)
Chloride: 102 mmol/L (ref 98–111)
Creatinine, Ser: 7.99 mg/dL — ABNORMAL HIGH (ref 0.61–1.24)
GFR, Estimated: 7 mL/min — ABNORMAL LOW (ref >60)
Glucose, Bld: 225 mg/dL — ABNORMAL HIGH (ref 70–99)
Potassium: 4.6 mmol/L (ref 3.5–5.1)
Sodium: 137 mmol/L (ref 135–145)

## 2023-09-17 LAB — HEPATITIS B SURFACE ANTIBODY, QUANTITATIVE: Hep B S AB Quant (Post): <3.5 m[IU]/mL — ABNORMAL LOW

## 2023-09-17 LAB — CBC
HCT: 29.2 % — ABNORMAL LOW (ref 39.0–52.0)
Hemoglobin: 10.3 g/dL — ABNORMAL LOW (ref 13.0–17.0)
MCH: 33.3 pg (ref 26.0–34.0)
MCHC: 35.3 g/dL (ref 30.0–36.0)
MCV: 94.5 fL (ref 80.0–100.0)
Platelets: 213 10*3/uL (ref 150–400)
RBC: 3.09 MIL/uL — ABNORMAL LOW (ref 4.22–5.81)
RDW: 14.1 % (ref 11.5–15.5)
WBC: 8 10*3/uL (ref 4.0–10.5)
nRBC: 0.5 % — ABNORMAL HIGH (ref 0.0–0.2)

## 2023-09-17 MED ORDER — SODIUM CHLORIDE 0.9% FLUSH
3.0000 mL | Freq: Two times a day (BID) | INTRAVENOUS | Status: DC
  Administered 2023-09-17 – 2023-09-18 (×2): 3 mL via INTRAVENOUS

## 2023-09-17 MED ORDER — ALTEPLASE 2 MG IJ SOLR: 2.0000 mg | Freq: Once | INTRAMUSCULAR | Status: DC | PRN

## 2023-09-17 MED ORDER — LIDOCAINE-PRILOCAINE 2.5-2.5 % EX CREA: 1.0000 | TOPICAL_CREAM | CUTANEOUS | Status: DC | PRN

## 2023-09-17 MED ORDER — PENTAFLUOROPROP-TETRAFLUOROETH EX AERO: 1.0000 | INHALATION_SPRAY | CUTANEOUS | Status: DC | PRN

## 2023-09-17 MED ORDER — LIDOCAINE HCL (PF) 1 % IJ SOLN: 5.0000 mL | INTRAMUSCULAR | Status: DC | PRN

## 2023-09-17 MED ORDER — HEPARIN SODIUM (PORCINE) 1000 UNIT/ML DIALYSIS: 1000.0000 [IU] | INTRAMUSCULAR | Status: DC | PRN

## 2023-09-17 MED ORDER — NEPRO/CARBSTEADY PO LIQD: 237.0000 mL | ORAL | Status: DC | PRN

## 2023-09-17 MED ORDER — HEPARIN SODIUM (PORCINE) 1000 UNIT/ML DIALYSIS
1000.0000 [IU] | INTRAMUSCULAR | Status: DC | PRN
  Administered 2023-09-17: 1000 [IU]
  Filled 2023-09-17: qty 1

## 2023-09-17 MED ORDER — HEPARIN SODIUM (PORCINE) 5000 UNIT/ML IJ SOLN
5000.0000 [IU] | Freq: Three times a day (TID) | INTRAMUSCULAR | Status: DC
  Administered 2023-09-17 – 2023-09-18 (×3): 5000 [IU] via SUBCUTANEOUS
  Filled 2023-09-17 (×4): qty 1

## 2023-09-17 MED ORDER — ACETAMINOPHEN 325 MG PO TABS
ORAL_TABLET | ORAL | Status: AC
  Filled 2023-09-17: qty 2

## 2023-09-17 MED ORDER — ANTICOAGULANT SODIUM CITRATE 4% (200MG/5ML) IV SOLN: 5.0000 mL | Status: DC | PRN

## 2023-09-17 NOTE — Progress Notes (Signed)
[facility]  ROUNDING NOTE   Subjective:   Derek Cooley.   is a 57 year old male with past medical conditions including COPD on home oxygen at 3 L, diabetes, struct of sleep apnea, anemia, hypertension, tobacco use, polysubstance abuse, CHF, end-stage renal disease on hemodialysis. Patient presents to emergency department with shortness of breath and has been admitted for Infestation by bed bug [B88.8] CHF (congestive heart failure) (HCC) [I50.9] Elevated troponin level [R79.89] ESRD (end stage renal disease) on dialysis (HCC) [N18.6, Z99.2] Acute and chronic respiratory failure with hypercapnia (HCC) [J96.22] Hypertensive emergency [I16.1]   Patient is known to our practice and receives outpatient dialysis treatments at [facility] on a TTS schedule, supervised by Dr Cherylann Ratel.   Patient seen sitting up in bed Alert Denies shortness of breath  Dialysis scheduled for later today  Objective:  Vital signs in last 24 hours:  Temp:  [97.4 F (36.3 C)-98.4 F (36.9 C)] 97.4 F (36.3 C) (10/08 1236) Pulse Rate:  [64-77] 70 (10/08 1236) Resp:  [16-22] 16 (10/08 1236) BP: (117-165)/(55-84) 117/84 (10/08 1236) SpO2:  [88 %-99 %] 95 % (10/08 1236)  Weight change:  Filed Weights   09/16/23 1243  Weight: 95 kg    Intake/Output: I/O last 3 completed shifts: In: 1226.7 [P.O.:1080; I.V.:146.7] Out: 525 [Urine:525]   Intake/Output this shift:  Total I/O In: -  Out: 300 [Urine:300]  Physical Exam: General: NAD  Head: Normocephalic, atraumatic. Moist oral mucosal membranes  Eyes: Anicteric  Lungs:  Clear to auscultation  Heart: Regular rate and rhythm  Abdomen:  Soft, nontender  Extremities:  No peripheral edema.  Neurologic: Alert, moving all four extremities  Skin: No lesions  Access: LT AVF      Basic Metabolic Panel: Recent Labs  Lab 09/16/23 0530 09/17/23 0623  NA 144 134*  137  K 4.7 4.5  4.6  CL 104 101  102  CO2 27 22  23   GLUCOSE  133* 222*  225*  BUN 79* 95*  94*  CREATININE 7.32* 7.90*  7.99*  CALCIUM 8.2* 7.8*  8.0*  PHOS  --  6.9*    Liver Function Tests: Recent Labs  Lab 09/16/23 0530 09/17/23 0623  AST 19  --   ALT 19  --   ALKPHOS 96  --   BILITOT 1.0  --   PROT 7.4  --   ALBUMIN 4.0 3.3*   No results for input(s): "LIPASE", "AMYLASE" in the last 168 hours. No results for input(s): "AMMONIA" in the last 168 hours.  CBC: Recent Labs  Lab 09/16/23 0530 09/17/23 0623  WBC 11.0* 8.0  NEUTROABS 9.0*  --   HGB 11.7* 10.3*  HCT 34.0* 29.2*  MCV 97.4 94.5  PLT 226 213    Cardiac Enzymes: No results for input(s): "CKTOTAL", "CKMB", "CKMBINDEX", "TROPONINI" in the last 168 hours.  BNP: Invalid input(s): "POCBNP"  CBG: No results for input(s): "GLUCAP" in the last 168 hours.  Microbiology: Results for orders placed or performed during the hospital encounter of 09/16/23  Resp panel by RT-PCR (RSV, Flu A&B, Covid) Anterior Nasal Swab     Status: None   Collection Time: 09/16/23  5:30 AM   Specimen: Anterior Nasal Swab  Result Value Ref Range Status   SARS Coronavirus 2 by RT PCR NEGATIVE NEGATIVE Final    Comment: (NOTE) SARS-CoV-2 target nucleic acids are NOT DETECTED.  The SARS-CoV-2 RNA is generally detectable in upper respiratory specimens during the acute phase of infection. The  lowest concentration of SARS-CoV-2 viral copies this assay can detect is 138 copies/mL. A negative result does not preclude SARS-Cov-2 infection and should not be used as the sole basis for treatment or other patient management decisions. A negative result may occur with  improper specimen collection/handling, submission of specimen other than nasopharyngeal swab, presence of viral mutation(s) within the areas targeted by this assay, and inadequate number of viral copies(<138 copies/mL). A negative result must be combined with clinical observations, patient history, and epidemiological information.  The expected result is Negative.  Fact Sheet for Patients:  BloggerCourse.com  Fact Sheet for Healthcare Providers:  SeriousBroker.it  This test is no t yet approved or cleared by the [place] FDA and  has been authorized for detection and/or diagnosis of SARS-CoV-2 by FDA under an Emergency Use Authorization (EUA). This EUA will remain  in effect (meaning this test can be used) for the duration of the COVID-19 declaration under Section 564(b)(1) of the Act, 21 U.S.C.section 360bbb-3(b)(1), unless the authorization is terminated  or revoked sooner.       Influenza A by PCR NEGATIVE NEGATIVE Final   Influenza B by PCR NEGATIVE NEGATIVE Final    Comment: (NOTE) The Xpert Xpress SARS-CoV-2/FLU/RSV plus assay is intended as an aid in the diagnosis of influenza from Nasopharyngeal swab specimens and should not be used as a sole basis for treatment. Nasal washings and aspirates are unacceptable for Xpert Xpress SARS-CoV-2/FLU/RSV testing.  Fact Sheet for Patients: BloggerCourse.com  Fact Sheet for Healthcare Providers: SeriousBroker.it  This test is not yet approved or cleared by the [place] FDA and has been authorized for detection and/or diagnosis of SARS-CoV-2 by FDA under an Emergency Use Authorization (EUA). This EUA will remain in effect (meaning this test can be used) for the duration of the COVID-19 declaration under Section 564(b)(1) of the Act, 21 U.S.C. section 360bbb-3(b)(1), unless the authorization is terminated or revoked.     Resp Syncytial Virus by PCR NEGATIVE NEGATIVE Final    Comment: (NOTE) Fact Sheet for Patients: BloggerCourse.com  Fact Sheet for Healthcare Providers: SeriousBroker.it  This test is not yet approved or cleared by the [place] FDA and has been authorized for detection and/or  diagnosis of SARS-CoV-2 by FDA under an Emergency Use Authorization (EUA). This EUA will remain in effect (meaning this test can be used) for the duration of the COVID-19 declaration under Section 564(b)(1) of the Act, 21 U.S.C. section 360bbb-3(b)(1), unless the authorization is terminated or revoked.  Performed at [facility], [address] [place], [place] [address]   MRSA Next Gen by PCR, Nasal     Status: None   Collection Time: 09/16/23 12:43 PM   Specimen: Nasal Mucosa; Nasal Swab  Result Value Ref Range Status   MRSA by PCR Next Gen NOT DETECTED NOT DETECTED Final    Comment: (NOTE) The GeneXpert MRSA Assay (FDA approved for NASAL specimens only), is one component of a comprehensive MRSA colonization surveillance program. It is not intended to diagnose MRSA infection nor to guide or monitor treatment for MRSA infections. Test performance is not FDA approved in patients less than 102 years old. Performed at [facility], [address] [place], [place] [address]     Coagulation Studies: No results for input(s): "LABPROT", "INR" in the last 72 hours.  Urinalysis: No results for input(s): "COLORURINE", "LABSPEC", "PHURINE", "GLUCOSEU", "HGBUR", "BILIRUBINUR", "KETONESUR", "PROTEINUR", "UROBILINOGEN", "NITRITE", "LEUKOCYTESUR" in the last 72 hours.  Invalid input(s): "APPERANCEUR"    Imaging:  CT HEAD WO CONTRAST ( )  Result Date: 09/16/2023 CLINICAL DATA:  Subarachnoid hemorrhage. EXAM: CT HEAD WITHOUT CONTRAST TECHNIQUE: Contiguous axial images were obtained from the base of the skull through the vertex without intravenous contrast. RADIATION DOSE REDUCTION: This exam was performed according to the departmental dose-optimization program which includes automated exposure control, adjustment of the mA and/or kV according to patient size and/or use of iterative reconstruction technique. COMPARISON:  Head CT 06/11/2023.  MRI brain 01/05/2022. 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 Tract from prior left frontal approach ventriculostomy. Cortical gray-white differentiation is otherwise preserved. No acute hemorrhage. No hydrocephalus or extra-axial collection. No mass effect or midline shift. Vascular: Prior coil embolization and clipping of the aneurysm along the right ICA terminus. Skull: Prior right pterional craniotomy. Sinuses/Orbits: Moderate right-greater-than-left maxillary sinus disease. Orbits are unremarkable. Other: None. IMPRESSION: 1. No acute intracranial abnormality. 2. 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 3. Moderate right-greater-than-left maxillary sinus disease. Electronically Signed   By: Orvan Falconer M.D.   On: 09/16/2023 20:16   DG Chest Port 1 View  Result Date: 09/16/2023 CLINICAL DATA:  Questionable sepsis EXAM: PORTABLE CHEST 1 VIEW COMPARISON:  06/27/2023 FINDINGS: Generous heart size that is stable. Airway cuffing also seen on most recent CT. There is COPD with apical emphysema by CT. There is no edema, consolidation, effusion, or pneumothorax. IMPRESSION: Chronic cardiomegaly and airway thickening. No acute or focal finding. Electronically Signed   By: Tiburcio Pea M.D.   On: 09/16/2023 06:02     Medications:    sodium chloride     anticoagulant sodium citrate      carvedilol  25 mg Oral BID WC   Chlorhexidine Gluconate Cloth  6 each Topical Q0600   DULoxetine  60 mg Oral Daily   fluticasone furoate-vilanterol  1 puff Inhalation Daily   And   umeclidinium bromide  1 puff Inhalation Daily   heparin  5,000 Units Subcutaneous Q12H   hydrALAZINE  100 mg Oral Q8H   lidocaine-prilocaine  1 Application Topical Daily   nicotine  21 mg Transdermal Daily   pantoprazole  (PROTONIX) IV  40 mg Intravenous Q24H   predniSONE  40 mg Oral Daily   sodium chloride flush  3 mL Intravenous Q12H   sodium chloride, acetaminophen, albuterol, alteplase, anticoagulant sodium citrate, feeding supplement (NEPRO CARB STEADY), heparin, heparin, lidocaine (PF), lidocaine-prilocaine, ondansetron (ZOFRAN) IV, pentafluoroprop-tetrafluoroeth, pentafluoroprop-tetrafluoroeth, sodium chloride flush  Assessment/ Plan:  Derek Cooley. is a 57 y.o.  male with past medical conditions including COPD on home oxygen at 3 L, diabetes, struct of sleep apnea, anemia, hypertension, tobacco use, polysubstance abuse, CHF, end-stage renal disease on hemodialysis. Patient presents to emergency department with shortness of breath and has been admitted for Infestation by bed bug [B88.8] CHF (congestive heart failure) (HCC) [I50.9] Elevated troponin level [R79.89] ESRD (end stage renal disease) on dialysis (HCC) [N18.6, Z99.2] Acute and chronic respiratory failure with hypercapnia (HCC) [J96.22] Hypertensive emergency [I16.1]  [facility]/TTS/Lt AVF  End stage renal disease on dialysis. Last treatment received on Monday. Chest xray negative for acute findings. Scheduled for treatment later today.   2. Acute respiratory failure due to missed dialysis treatment. Weaned to room air  3. Hypertensive emergency, Blood pressure 202/104. Placed on Nitroglycerin drip in ED. Weaned  off Niro drip. Blood pressure stable for now.   4. Diabetes mellitus type II with chronic kidney disease/renal manifestations:noninsulin dependent. Most recent hemoglobin A1c is 5.5 on 06/10/23.   5. Secondary Hyperparathyroidism: with outpatient labs: None  Lab Results  Component Value Date   PTH 135 (H) 12/07/2021   PTH Comment 12/07/2021   CALCIUM 8.0 (L) 09/17/2023   CALCIUM 7.8 (L) 09/17/2023   PHOS 6.9 (H) 09/17/2023    Hyperphosphatemia noted. Will slowly improve with dialysis   LOS: 1    10/8/20241:15 PM

## 2023-09-17 NOTE — Progress Notes (Signed)
  Received patient in bed to unit.   Informed consent signed and in chart.    TX duration: 3 hrs 14 mins Dr. Ronn Melena ordered to end tx 16 mins early d/t dialyzer clotting, constant  Machine alarms d/t high VP with pt movement, and that the patient Had been on the machine over his prescribed time (tx should have ended at 1705) Tablo machine stopped counting down tx time several times after pt arm movement       Hand-off given to patient's nurse.    Access used: L LA AVF Access issues: High VP with pt movement   Total UF removed: 2158 mL Medication(s) given: Tylenol Post HD VS: WDL Post HD weight: 95 kg      [facility]

## 2023-09-17 NOTE — Discharge Instructions (Signed)
Rent/Utility/Housing  Agency Name: [facility] Address: [address] [place], [place] [address] Phone: [phone] Email: [email or web address] Website: [email or web address] Service(s) Offered: Housing services, self-sufficiency, congregate meal program, weatherization program, heating appliance  repair/replacement program, emergency food assistance,  housing counseling, home ownership program, wheels -towork program.  Agency Name: [facility] Address: [address], [place], [place] [address] Phone: [phone] (8a-4p) [phone] (8p- 10p) Email: [email or web address] Website: [email or web address] Service(s) Offered: A program for homeless and/or needy men that includes one-on-one counseling, life skills training and job rehabilitation.  Agency Name: [facility] Address: [address], [place], [place] [address] Phone: [phone] Website: www.[email or web address].org Service(s) Offered: Assistance to needy in emergency with utility bills, heating fuel, and prescriptions. Shelter for homeless 7pm-7am. April 04, 2017 15  Agency Name: [facility] Address: [address], [place], [place] [address] Phone: [phone] Contact Person: Wayne Dawson Email: [email or web address] Website: [email or web address].org Service(s) Offered: Helps individuals with developmental disabilities move from housing that is more restrictive to homes where they  can achieve greater independence and have more  opportunities.  Agency Name: [facility] Address: [address], [place], [place] [address] Phone: [phone] Email: [email or web address] Website: www.[email or web address].org Service(s) Offered: Provides affordable housing for low-income families, elderly, and disabled individuals. Offer a wide range of  programs and services, from financial planning to  afterschool and summer programs.  Agency Name: [facility] Address: [address] [place], [place], [place] [address] Phone: [phone] Service(s) Offered: Child support services; child welfare services; food stamps; Medicaid; work first family assistance; and aid with fuel,  rent, food and medicine.  Agency Name: [facility] Address: [facility] [place], [place]  [address] Phone: [phone] Website: www.familyabuseservices.org Service(s) Offered: 24 hour Crisis Line: [phone]; 24 hour Emergency Shelter; Transitional Housing; Support Groups; Court Advocacy; Community Education; Hispanic Outreach: [phone];  Visitation Center: [phone].  Agency Name: [facility] Address: [address] [place], [place] [address] Phone: [phone] Service(s) Offered: CAP Services; Home and Community Supports; Individual or Group Supports; Respite Care Non-Institutional Nursing;  Residential Supports; Respite Care and Personal Care Services; Transportation; Family and Friends Night; Recreational Activities; Three Nutritious Meals/Snacks; Consultation with Registered Dietician; Twenty-four hour Registered Nurse Access; Daily and Community Living Skills; Camp Green Leaves; Summer Camp for the Special Population (During Summer Months) Bingo Night (Every  Wednesday Night); Special Populations Dance Night  (Every Tuesday Night); Professional Hair Care Services.  Agency Name: [facility] Address: [address], [place], [place] [address] Phone: [phone] Contact Person: Gloria McCauley Website: [email or web address] Service(s) Offered: Residential treatment facility for women; food and  clothing, educational & employment development and  transportation to work; development of financial skills;  parenting and family reunification; emotional and spiritual  support;  transitional housing for program graduates.  Agency Name: [place] [facility] Address: [address], [place], [place] [address] Phone: [phone] Email: [email or web address] Website: [email or web address] Service(s) Offered: Public housing units for elderly, disabled, and low income people; housing choice vouchers for income eligible  applicants; shelter plus care vouchers; and HOPWA voucher program.  Agency Name: [facility] Address: [address], [place],  [address] Phone: [phone] Email: [email or web address] Website: www.habitatalamance.org Service(s) Offered: Build houses for families in need of decent housing. Each adult in the family must invest 200 hours of labor on  someone else's house, work with volunteers to build their own house, attend classes   on budgeting, home maintenance, yard care, and attend homeowner association meetings.  Agency Name: [facility] Address: [address], [place], [place] [address] Phone: [phone] Website: www.rsli.org Service(s) Offered: Intermediate care facilities for intellectually delayed, Supervised Living in group homes for adults with developmental disabilities, Supervised Living for people who have dual diagnoses (MRMI), Independent Living, Supported Living, respite and a variety of CAP services, pre-vocational services, day supports, and Community Support Services.  Agency Name: [place] [employer] Phone: [phone] Website: www.[place]CForeclosurePrevention.gov Service(s) Offered: Zero-interest, deferred loans to homeowners struggling to pay their mortgage. Call for more information.  

## 2023-09-17 NOTE — Progress Notes (Signed)
Progress Note   Patient: Derek Cooley. OZH:[id number] DOB: 1966/02/12 DOA: 09/16/2023     1 DOS: the patient was seen and examined on 09/17/2023   Brief hospital course: Derek Cooley. is a 57 y.o. male with medical history significant of refractory HTN noncompliant with medications, ESRD on HD TTS, brain aneurysm SAH status post clipping 2020, chronic HFpEF, COPD, OSA on CPAP, chronic amphetamine abuse, mild cognitive impairment secondary to previous stroke, presented with worsening of shortness of breath and headache and chest pain.  Patient missed dialysis for more than 7 days, ran out of blood pressure medications and has been increasingly short of breath with pressure-like chest discomfort.  In the emergency department patient was placed on BiPAP, troponin elevated started on nitro drip, steroids and DuoNebs admitted to hospitalist service for further management evaluation.  Assessment and Plan: Acute on chronic hypoxic and hypercapnic respiratory failure Acute on chronic HFpEF Acute COPD exacerbation Fluid overload secondary to missed hemodialysis Patient's BiPAP tapered off, currently on 2 to 3 L supplemental oxygen. He got 1 dose of IV Lasix. Nephrology plan to do hemodialysis today. Continue p.o. steroids with taper, continue DuoNebs, bronchodilator therapy. Continue to wean supplemental oxygen as tolerated. Patient's symptoms will likely improve with next session of hemodialysis.  Hypertensive emergency With endorgan damage heart failure, kidney dysfunction. Patient's nitro drip tapered down. Patient is continued on Coreg 25 mg twice daily, hydralazine 100 mg Q8. Continue IV hydralazine 10 mg every 6 as needed for elevated blood pressures.  Elevated troponins- Likely due to demand ischemia from fluid overload, uncontrolled hypertension. Normal stress test July 2023.  OSA- CPAP at bedtime  Obesity with BMI 33.5 Diet, exercise and weight reduction advised.  Bedbug  infestation- Continue isolation protocol. RN making sure he takes shower, belongings isolated before hemodialysis.      Out of bed to chair. Incentive spirometry. Nursing supportive care. Fall, aspiration precautions. DVT prophylaxis-heparin   Code Status: Full Code  Subjective: Patient is seen and examined today morning.  He is sitting on the chair, has mild shortness of breath.  States he missed hemodialysis due to malfunctioning IJ.  No chest pain at this time.  Physical Exam: Vitals:   09/17/23 1509 09/17/23 1513 09/17/23 1536 09/17/23 1600  BP: (!) 140/68  (!) 142/72 (!) 142/72  Pulse: 69  75 65  Resp: 17  (!) 25 18  Temp: 97.6 F (36.4 C)     TempSrc: Oral     SpO2: 96%  97% 99%  Weight:  97.1 kg    Height:        General - Middle aged obese Caucasian male, sitting with mild respiratory distress HEENT - PERRLA, EOMI, atraumatic head, non tender sinuses. Lung - Clear, diffuse rales, rhonchi. Heart - S1, S2 heard, no murmurs, rubs, 1+ pedal edema. Abdomen - Soft, non tender obese, bowel sounds good Neuro - Alert, awake and oriented x 3, non focal exam. Skin - Warm and dry.  Data Reviewed:      Latest Ref Rng & Units 09/17/2023    6:23 AM 09/16/2023    5:30 AM 06/29/2023    8:25 AM  CBC  WBC 4.0 - 10.5 K/uL 8.0  11.0  8.4   Hemoglobin 13.0 - 17.0 g/dL 62.9  52.8  41.3   Hematocrit 39.0 - 52.0 % 29.2  34.0  36.8   Platelets 150 - 400 K/uL 213  226  279       Latest Ref Rng &  Units 09/17/2023    6:23 AM 09/16/2023    5:30 AM 06/29/2023    5:06 AM  BMP  Glucose 70 - 99 mg/dL 70 - 99 mg/dL 161    096  045  409   BUN 6 - 20 mg/dL 6 - 20 mg/dL 94    95  79  57   Creatinine 0.61 - 1.24 mg/dL 8.11 - 9.14 mg/dL 7.82    9.56  2.13  0.86   Sodium 135 - 145 mmol/L 135 - 145 mmol/L 137    134  144  139   Potassium 3.5 - 5.1 mmol/L 3.5 - 5.1 mmol/L 4.6    4.5  4.7  4.7   Chloride 98 - 111 mmol/L 98 - 111 mmol/L 102    101  104  109   CO2 22 - 32 mmol/L 22 -  32 mmol/L 23    22  27  21    Calcium 8.9 - 10.3 mg/dL 8.9 - 57.8 mg/dL 8.0    7.8  8.2  8.5    CT HEAD WO CONTRAST ( )  Result Date: 09/16/2023 CLINICAL DATA:  Subarachnoid hemorrhage. EXAM: CT HEAD WITHOUT CONTRAST TECHNIQUE: Contiguous axial images were obtained from the base of the skull through the vertex without intravenous contrast. RADIATION DOSE REDUCTION: This exam was performed according to the departmental dose-optimization program which includes automated exposure control, adjustment of the mA and/or kV according to patient size and/or use of iterative reconstruction technique. COMPARISON:  Head CT 06/11/2023.  MRI brain 01/05/2022. 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 Tract from prior left frontal approach ventriculostomy. Cortical gray-white differentiation is otherwise preserved. No acute hemorrhage. No hydrocephalus or extra-axial collection. No mass effect or midline shift. Vascular: Prior coil embolization and clipping of the aneurysm along the right ICA terminus. Skull: Prior right pterional craniotomy. Sinuses/Orbits: Moderate right-greater-than-left maxillary sinus disease. Orbits are unremarkable. Other: None. IMPRESSION: 1. No acute intracranial abnormality. 2. 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 3. Moderate right-greater-than-left maxillary sinus disease. Electronically Signed   By: Orvan Falconer M.D.   On: 09/16/2023 20:16   DG Chest Port 1 View  Result Date: 09/16/2023 CLINICAL DATA:  Questionable sepsis EXAM: PORTABLE CHEST 1 VIEW COMPARISON:  06/27/2023 FINDINGS: Generous heart size that is stable. Airway cuffing also seen on most recent CT. There is COPD with apical emphysema by CT. There is no edema, consolidation,  effusion, or pneumothorax. IMPRESSION: Chronic cardiomegaly and airway thickening. No acute or focal finding. Electronically Signed   By: Tiburcio Pea M.D.   On: 09/16/2023 06:02     Family Communication: Discussed with patient, he understands and agrees. All questions answereed.    Disposition: Status is: Inpatient Remains inpatient appropriate because: fluid overload, CHF, ESRD missed HD  Planned Discharge Destination: Home     Time spent: 41 minutes  Author: Marcelino Duster, MD 09/17/2023 4:26 PM Secure chat 7am to 7pm For on call review [email or web address].

## 2023-09-17 NOTE — TOC Initial Note (Signed)
Transition of Care (TOC) - Initial/Assessment Note    Patient Details  Name: Derek Cooley. MRN: [id number] Date of Birth: Mar 14, 1966  Transition of Care [facility]) CM/SW Contact:    Darolyn Rua, LCSW Phone Number: 09/17/2023, 12:37 PM  Clinical Narrative:                   Patient from home with cousin and his friend,confirmed address in chart as accurate.   Uses [facility] in [place] for pharmacy.   Reports he does use PCP Cindee Salt, NP.   Reports he has no needs at dc, his family/friends can pick him up.   Expected Discharge Plan: Home/Self Care Barriers to Discharge: Continued Medical Work up   Patient Goals and CMS Choice Patient states their goals for this hospitalization and ongoing recovery are:: to go home          Expected Discharge Plan and Services       Living arrangements for the past 2 months: Single Family Home                                      Prior Living Arrangements/Services Living arrangements for the past 2 months: Single Family Home Lives with:: Relatives                   Activities of Daily Living   ADL Screening (condition at time of admission) Independently performs ADLs?: Yes (appropriate for developmental age) Is the patient deaf or have difficulty hearing?: No Does the patient have difficulty seeing, even when wearing glasses/contacts?: No Does the patient have difficulty concentrating, remembering, or making decisions?: No  Permission Sought/Granted                  Emotional Assessment              Admission diagnosis:  Infestation by bed bug [B88.8] CHF (congestive heart failure) (HCC) [I50.9] Elevated troponin level [R79.89] ESRD (end stage renal disease) on dialysis (HCC) [N18.6, Z99.2] Acute and chronic respiratory failure with hypercapnia (HCC) [J96.22] Hypertensive emergency [I16.1] Patient Active Problem List   Diagnosis Date Noted   CHF (congestive heart failure) (HCC)  09/16/2023   Obesity (BMI 30-39.9) 06/28/2023   Problem with dialysis access (HCC) 06/27/2023   Chronic left shoulder pain 06/27/2023   ESRD (end stage renal disease) on dialysis (HCC) 03/10/2023   Hypoxia 03/09/2023   SOB (shortness of breath) 03/09/2023   ESRD on hemodialysis (HCC) 09/07/2022   Polysubstance abuse (HCC)    Low back pain 06/29/2022   HLD (hyperlipidemia)    Type II diabetes mellitus with renal manifestations (HCC)    Depression with anxiety    Anemia in CKD (chronic kidney disease)    Chronic heart failure with preserved ejection fraction (HFpEF) (HCC)    Malignant HTN with heart disease, w/o CHF, 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 11/25/2021   Flash pulmonary edema (HCC) 11/25/2021   Cocaine abuse (HCC) 11/25/2021   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   Nonadherence to medication 07/06/2020   Hypertriglyceridemia 07/06/2019   Statin intolerance 01/02/2019   Hx of aneurysm 10/16/2017   Chronic obstructive pulmonary disease (COPD) (HCC) 04/17/2016   GERD (gastroesophageal reflux disease) 08/25/2015   Chronic anxiety 05/16/2015   Persistent headaches 05/16/2015   Tobacco abuse 05/16/2015   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/2013   OSA (obstructive sleep apnea) 06/23/2013   Cerebral  aneurysm 06/23/2013   Mild cognitive disorder 03/27/2011   Mild cognitive impairment 03/27/2011   Vitreous hemorrhage (HCC) 03/27/2011   Hypertension associated with diabetes (HCC) 03/06/2011   Morbid obesity (HCC) 03/06/2011   PCP:  Larae Grooms, NP Pharmacy:   [facility] [address] [place], [place] - [address] [place] [address] Phone: [phone] Fax: [phone]  [id number] [place], [place] - [address] AT [facility] OF [address] [place] [address] Phone: [phone] Fax: [phone]     Social Determinants of Health (SDOH) Social History: SDOH Screenings    Food Insecurity: Food Insecurity Present (06/27/2023)  Housing: Low Risk  (06/27/2023)  Transportation Needs: No Transportation Needs (06/27/2023)  Utilities: Not At Risk (06/27/2023)  Alcohol Screen: Low Risk  (06/25/2023)  Depression (PHQ2-9): Medium Risk (06/25/2023)  Financial Resource Strain: Low Risk  (06/25/2023)  Physical Activity: Inactive (06/25/2023)  Social Connections: Socially Isolated (06/25/2023)  Stress: No Stress Concern Present (06/25/2023)  Tobacco Use: High Risk (06/27/2023)  Health Literacy: Inadequate Health Literacy (06/25/2023)   SDOH Interventions:     Readmission Risk Interventions    06/25/2022    3:34 PM  Readmission Risk Prevention Plan  Transportation Screening Complete  Medication Review (RN Care Manager) Complete  PCP or Specialist appointment within 3-5 days of discharge Complete  SW Recovery Care/Counseling Consult Complete  Palliative Care Screening Not Applicable  Skilled Nursing Facility Not Applicable

## 2023-09-18 DIAGNOSIS — B888 Other specified infestations: Secondary | ICD-10-CM

## 2023-09-18 DIAGNOSIS — R7989 Other specified abnormal findings of blood chemistry: Secondary | ICD-10-CM | POA: Diagnosis not present

## 2023-09-18 DIAGNOSIS — J441 Chronic obstructive pulmonary disease with (acute) exacerbation: Secondary | ICD-10-CM

## 2023-09-18 DIAGNOSIS — J9622 Acute and chronic respiratory failure with hypercapnia: Secondary | ICD-10-CM

## 2023-09-18 DIAGNOSIS — I161 Hypertensive emergency: Secondary | ICD-10-CM | POA: Diagnosis not present

## 2023-09-18 LAB — BASIC METABOLIC PANEL
Anion gap: 12 (ref 5–15)
BUN: 54 mg/dL — ABNORMAL HIGH (ref 6–20)
CO2: 25 mmol/L (ref 22–32)
Calcium: 7.8 mg/dL — ABNORMAL LOW (ref 8.9–10.3)
Chloride: 95 mmol/L — ABNORMAL LOW (ref 98–111)
Creatinine, Ser: 5.2 mg/dL — ABNORMAL HIGH (ref 0.61–1.24)
GFR, Estimated: 12 mL/min — ABNORMAL LOW (ref >60)
Glucose, Bld: 161 mg/dL — ABNORMAL HIGH (ref 70–99)
Potassium: 4.2 mmol/L (ref 3.5–5.1)
Sodium: 132 mmol/L — ABNORMAL LOW (ref 135–145)

## 2023-09-18 MED ORDER — ALBUTEROL SULFATE HFA 108 (90 BASE) MCG/ACT IN AERS: 1.0000 | INHALATION_SPRAY | Freq: Four times a day (QID) | RESPIRATORY_TRACT | 1 refills | Status: DC | PRN

## 2023-09-18 MED ORDER — PREDNISONE 20 MG PO TABS: 40.0000 mg | ORAL_TABLET | Freq: Every day | ORAL | 0 refills | Status: AC

## 2023-09-18 NOTE — Progress Notes (Signed)
Per RN patient family reported that they were unable to bring o2 for patient pick up today as tanks are empty and patient was having issues with home concentrator.   CSW spoke with Cletis Athens with [employer], they will bring o2 to room for discharge and follow up in home to address issues.   No additional discharge needs.   Darolyn Rua, [place], MSW, [place] [phone]

## 2023-09-18 NOTE — Discharge Summary (Signed)
Physician Discharge Summary   Patient: Derek Cooley. MRN: [id number] DOB: Nov 23, 1966  Admit date:     09/16/2023  Discharge date: 09/18/23  Discharge Physician: Loyce Dys   PCP: Larae Grooms, NP   Recommendations at discharge:  Follow-up with nephrology  Discharge Diagnoses: Acute on chronic hypoxic and hypercapnic respiratory failure Acute on chronic HFpEF Acute COPD exacerbation Fluid overload secondary to missed hemodialysis Hypertensive emergency Elevated troponins- OSA Obesity with BMI 33.5 Bedbug infestation-    Hospital Course: Derek Cooley. is a 57 y.o. male with medical history significant of refractory HTN noncompliant with medications, ESRD on HD TTS, brain aneurysm SAH status post clipping 2020, chronic HFpEF, COPD, OSA on CPAP, chronic amphetamine abuse, mild cognitive impairment secondary to previous stroke, presented with worsening of shortness of breath and headache and chest pain.  Patient missed dialysis for more than 7 days, ran out of blood pressure medications and has been increasingly short of breath with pressure-like chest discomfort.  In the emergency department patient was placed on BiPAP, troponin elevated started on nitro drip, steroids and DuoNebs admitted to hospitalist service for further management evaluation.  During hospitalization patient was managed on acute on chronic hypoxic and hypercapnic respiratory failure secondary to fluid overload from missed dialysis as well as acute on chronic HFpEF and acute COPD exacerbation.  Patient improved was weaned off of BiPAP and transition to his regular nasal oxygen of 3 L which he uses at home.  Patient have now been cleared for discharge today and to follow-up as an outpatient with his outpatient physicians and nephrology    Consultants: Nephrology Procedures performed: Hemodialysis Disposition: Home Diet recommendation:  Renal diet DISCHARGE MEDICATION: Allergies as of 09/18/2023        Reactions   Atorvastatin    Joint Aches - Severe Joint Aches - Severe Joint Aches - Severe   Avelox [moxifloxacin Hcl In Nacl]    Muscle pain   Buprenorphine    Mouth sores, confusion, shaking   Dilaudid [hydromorphone Hcl] Hives   Fluoxetine Itching   Levofloxacin Other (See Comments)   Joint Pain   Morphine And Codeine    Other    Muscle pain   Suboxone [buprenorphine Hcl-naloxone Hcl] Other (See Comments)   Rash and confused   Vancomycin    Renal insufficiency        Medication List     STOP taking these medications    torsemide 5 MG tablet Commonly known as: DEMADEX       TAKE these medications    albuterol 108 (90 Base) MCG/ACT inhaler Commonly known as: VENTOLIN HFA Inhale 1-2 puffs into the lungs every 6 (six) hours as needed for wheezing or shortness of breath.   carvedilol 25 MG tablet Commonly known as: COREG Take 25 mg by mouth 2 (two) times daily with a meal.   DULoxetine 60 MG capsule Commonly known as: CYMBALTA Take 1 capsule (60 mg total) by mouth daily.   hydrALAZINE 100 MG tablet Commonly known as: APRESOLINE Take 100 mg by mouth 2 (two) times daily.   lidocaine-prilocaine cream Commonly known as: EMLA Apply 1 Application topically daily.   losartan 25 MG tablet Commonly known as: COZAAR Take 1 tablet (25 mg total) by mouth daily.   OneTouch Ultra test strip Generic drug: glucose blood USE TO TEST BLOOD SUGAR DAILY   OXYGEN Inhale 3 L into the lungs daily.   pantoprazole 40 MG tablet Commonly known as: PROTONIX TAKE 1 TABLET(40  MG) BY MOUTH TWICE DAILY AS NEEDED   predniSONE 20 MG tablet Commonly known as: DELTASONE Take 2 tablets (40 mg total) by mouth daily for 4 days.   topiramate 50 MG tablet Commonly known as: TOPAMAX TAKE 1 TABLET(50 MG) BY MOUTH TWICE DAILY   Trelegy Ellipta 100-62.5-25 MCG/ACT Aepb Generic drug: Fluticasone-Umeclidin-Vilant Inhale 1 puff into the lungs daily.        Discharge Exam: Filed  Weights   09/17/23 1513 09/17/23 2010 09/18/23 0844  Weight: 97.1 kg 95 kg 94.3 kg   General - Middle aged obese Caucasian male, sitting with in no respiratory distress HEENT - PERRLA, EOMI, atraumatic head, non tender sinuses. Lung - Clear, diffuse rales, rhonchi. Heart - S1, S2 heard, no murmurs, rubs, 1+ pedal edema. Abdomen - Soft, non tender obese, bowel sounds good Neuro - Alert, awake and oriented x 3, non focal exam. Skin - Warm and dry.  Condition at discharge: good  The results of significant diagnostics from this hospitalization (including imaging, microbiology, ancillary and laboratory) are listed below for reference.   Imaging Studies: CT HEAD WO CONTRAST ( )  Result Date: 09/16/2023 CLINICAL DATA:  Subarachnoid hemorrhage. EXAM: CT HEAD WITHOUT CONTRAST TECHNIQUE: Contiguous axial images were obtained from the base of the skull through the vertex without intravenous contrast. RADIATION DOSE REDUCTION: This exam was performed according to the departmental dose-optimization program which includes automated exposure control, adjustment of the mA and/or kV according to patient size and/or use of iterative reconstruction technique. COMPARISON:  Head CT 06/11/2023.  MRI brain 01/05/2022. 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 Tract from prior left frontal approach ventriculostomy. Cortical gray-white differentiation is otherwise preserved. No acute hemorrhage. No hydrocephalus or extra-axial collection. No mass effect or midline shift. Vascular: Prior coil embolization and clipping of the aneurysm along the right ICA terminus. Skull: Prior right pterional craniotomy. Sinuses/Orbits: Moderate right-greater-than-left maxillary sinus disease. Orbits are unremarkable. Other: None. IMPRESSION: 1. No acute intracranial abnormality.  2. 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 3. Moderate right-greater-than-left maxillary sinus disease. Electronically Signed   By: Orvan Falconer M.D.   On: 09/16/2023 20:16   DG Chest Port 1 View  Result Date: 09/16/2023 CLINICAL DATA:  Questionable sepsis EXAM: PORTABLE CHEST 1 VIEW COMPARISON:  06/27/2023 FINDINGS: Generous heart size that is stable. Airway cuffing also seen on most recent CT. There is COPD with apical emphysema by CT. There is no edema, consolidation, effusion, or pneumothorax. IMPRESSION: Chronic cardiomegaly and airway thickening. No acute or focal finding. Electronically Signed   By: Tiburcio Pea M.D.   On: 09/16/2023 06:02   PERIPHERAL VASCULAR CATHETERIZATION  Result Date: 09/09/2023 See surgical note for result.   Microbiology: Results for orders placed or performed during the hospital encounter of 09/16/23  Resp panel by RT-PCR (RSV, Flu A&B, Covid) Anterior Nasal Swab     Status: None   Collection Time: 09/16/23  5:30 AM   Specimen: Anterior Nasal Swab  Result Value Ref Range Status   SARS Coronavirus 2 by RT PCR NEGATIVE NEGATIVE Final    Comment: (NOTE) SARS-CoV-2 target nucleic acids are NOT DETECTED.  The SARS-CoV-2 RNA is generally detectable in upper respiratory specimens during the acute phase of infection. The lowest concentration of SARS-CoV-2 viral copies this assay can detect is 138 copies/mL. A  negative result does not preclude SARS-Cov-2 infection and should not be used as the sole basis for treatment or other patient management decisions. A negative result may occur with  improper specimen collection/handling, submission of specimen other than nasopharyngeal swab, presence of viral mutation(s) within the areas targeted by this assay, and inadequate number of viral copies(<138 copies/mL). A negative result must be combined with clinical observations,  patient history, and epidemiological information. The expected result is Negative.  Fact Sheet for Patients:  BloggerCourse.com  Fact Sheet for Healthcare Providers:  SeriousBroker.it  This test is no t yet approved or cleared by the [place] FDA and  has been authorized for detection and/or diagnosis of SARS-CoV-2 by FDA under an Emergency Use Authorization (EUA). This EUA will remain  in effect (meaning this test can be used) for the duration of the COVID-19 declaration under Section 564(b)(1) of the Act, 21 U.S.C.section 360bbb-3(b)(1), unless the authorization is terminated  or revoked sooner.       Influenza A by PCR NEGATIVE NEGATIVE Final   Influenza B by PCR NEGATIVE NEGATIVE Final    Comment: (NOTE) The Xpert Xpress SARS-CoV-2/FLU/RSV plus assay is intended as an aid in the diagnosis of influenza from Nasopharyngeal swab specimens and should not be used as a sole basis for treatment. Nasal washings and aspirates are unacceptable for Xpert Xpress SARS-CoV-2/FLU/RSV testing.  Fact Sheet for Patients: BloggerCourse.com  Fact Sheet for Healthcare Providers: SeriousBroker.it  This test is not yet approved or cleared by the [place] FDA and has been authorized for detection and/or diagnosis of SARS-CoV-2 by FDA under an Emergency Use Authorization (EUA). This EUA will remain in effect (meaning this test can be used) for the duration of the COVID-19 declaration under Section 564(b)(1) of the Act, 21 U.S.C. section 360bbb-3(b)(1), unless the authorization is terminated or revoked.     Resp Syncytial Virus by PCR NEGATIVE NEGATIVE Final    Comment: (NOTE) Fact Sheet for Patients: BloggerCourse.com  Fact Sheet for Healthcare Providers: SeriousBroker.it  This test is not yet approved or cleared by the [place] FDA and has been authorized for detection and/or diagnosis of SARS-CoV-2 by FDA under an Emergency Use Authorization (EUA). This EUA will remain in effect (meaning this test can be used) for the duration of the COVID-19 declaration under Section 564(b)(1) of the Act, 21 U.S.C. section 360bbb-3(b)(1), unless the authorization is terminated or revoked.  Performed at [facility], [address] [place], [place] [address]   MRSA Next Gen by PCR, Nasal     Status: None   Collection Time: 09/16/23 12:43 PM   Specimen: Nasal Mucosa; Nasal Swab  Result Value Ref Range Status   MRSA by PCR Next Gen NOT DETECTED NOT DETECTED Final    Comment: (NOTE) The GeneXpert MRSA Assay (FDA approved for NASAL specimens only), is one component of a comprehensive MRSA colonization surveillance program. It is not intended to diagnose MRSA infection nor to guide or monitor treatment for MRSA infections. Test performance is not FDA approved in patients less than 44 years old. Performed at [facility], [address] [place], [place] [address]     Labs: CBC: Recent Labs  Lab 09/16/23 0530 09/17/23 0623  WBC 11.0* 8.0  NEUTROABS 9.0*  --   HGB 11.7* 10.3*  HCT 34.0* 29.2*  MCV 97.4 94.5  PLT 226 213   Basic Metabolic Panel: Recent Labs  Lab 09/16/23 0530 09/17/23 0623 09/18/23 0427  NA 144 134*  137  132*  K 4.7 4.5  4.6 4.2  CL 104 101  102 95*  CO2 27 22  23 25   GLUCOSE 133* 222*  225* 161*  BUN 79* 95*  94* 54*  CREATININE 7.32* 7.90*  7.99* 5.20*  CALCIUM 8.2* 7.8*  8.0* 7.8*  PHOS  --  6.9*  --    Liver Function Tests: Recent Labs  Lab 09/16/23 0530 09/17/23 0623  AST 19  --   ALT 19  --   ALKPHOS 96  --   BILITOT 1.0  --   PROT 7.4  --   ALBUMIN 4.0 3.3*   CBG: No results for input(s): "GLUCAP" in the last 168 hours.  Discharge time spent:  .  Signed: Loyce Dys, MD [facility] 09/18/2023

## 2023-09-18 NOTE — Progress Notes (Signed)
This RN received call from pt's sister, Luster Landsberg. Per sister, pt only has empty O2 tanks at home. This RN Halford Chessman, LCSW regarding these concerns. MD updated.

## 2023-09-18 NOTE — Plan of Care (Signed)

## 2023-09-18 NOTE — Progress Notes (Signed)
[facility]  ROUNDING NOTE   Subjective:   Derek Cooley.   is a 57 year old male with past medical conditions including COPD on home oxygen at 3 L, diabetes, struct of sleep apnea, anemia, hypertension, tobacco use, polysubstance abuse, CHF, end-stage renal disease on hemodialysis. Patient presents to emergency department with shortness of breath and has been admitted for Infestation by bed bug [B88.8] CHF (congestive heart failure) (HCC) [I50.9] Elevated troponin level [R79.89] ESRD (end stage renal disease) on dialysis (HCC) [N18.6, Z99.2] Acute and chronic respiratory failure with hypercapnia (HCC) [J96.22] Hypertensive emergency [I16.1]   Patient is known to our practice and receives outpatient dialysis treatments at [facility] on a TTS schedule, supervised by Dr Cherylann Ratel.   Patient sitting up in bed Alert States he feels ok Baseline oxygen requirement, 3L  Objective:  Vital signs in last 24 hours:  Temp:  [97.4 F (36.3 C)-98.4 F (36.9 C)] 98.1 F (36.7 C) (10/09 1155) Pulse Rate:  [65-75] 68 (10/09 1155) Resp:  [15-28] 22 (10/09 1155) BP: (117-159)/(54-96) 142/61 (10/09 1155) SpO2:  [95 %-100 %] 96 % (10/09 1155) Weight:  [94.3 kg-97.1 kg] 94.3 kg (10/09 0844)  Weight change: 2.1 kg Filed Weights   09/17/23 1513 09/17/23 2010 09/18/23 0844  Weight: 97.1 kg 95 kg 94.3 kg    Intake/Output: I/O last 3 completed shifts: In: 1106.7 [P.O.:960; I.V.:146.7] Out: 2508 [Urine:350; Other:2158]   Intake/Output this shift:  Total I/O In: -  Out: 350 [Urine:350]  Physical Exam: General: NAD  Head: Normocephalic, atraumatic. Moist oral mucosal membranes  Eyes: Anicteric  Lungs:  Diminished in bases, [place] O2  Heart: Regular rate and rhythm  Abdomen:  Soft, nontender  Extremities:  No peripheral edema.  Neurologic: Alert, moving all four extremities  Skin: No lesions  Access: LT AVF      Basic Metabolic Panel: Recent Labs  Lab 09/16/23 0530  09/17/23 0623 09/18/23 0427  NA 144 134*  137 132*  K 4.7 4.5  4.6 4.2  CL 104 101  102 95*  CO2 27 22  23 25   GLUCOSE 133* 222*  225* 161*  BUN 79* 95*  94* 54*  CREATININE 7.32* 7.90*  7.99* 5.20*  CALCIUM 8.2* 7.8*  8.0* 7.8*  PHOS  --  6.9*  --     Liver Function Tests: Recent Labs  Lab 09/16/23 0530 09/17/23 0623  AST 19  --   ALT 19  --   ALKPHOS 96  --   BILITOT 1.0  --   PROT 7.4  --   ALBUMIN 4.0 3.3*   No results for input(s): "LIPASE", "AMYLASE" in the last 168 hours. No results for input(s): "AMMONIA" in the last 168 hours.  CBC: Recent Labs  Lab 09/16/23 0530 09/17/23 0623  WBC 11.0* 8.0  NEUTROABS 9.0*  --   HGB 11.7* 10.3*  HCT 34.0* 29.2*  MCV 97.4 94.5  PLT 226 213    Cardiac Enzymes: No results for input(s): "CKTOTAL", "CKMB", "CKMBINDEX", "TROPONINI" in the last 168 hours.  BNP: Invalid input(s): "POCBNP"  CBG: No results for input(s): "GLUCAP" in the last 168 hours.  Microbiology: Results for orders placed or performed during the hospital encounter of 09/16/23  Resp panel by RT-PCR (RSV, Flu A&B, Covid) Anterior Nasal Swab     Status: None   Collection Time: 09/16/23  5:30 AM   Specimen: Anterior Nasal Swab  Result Value Ref Range Status   SARS Coronavirus 2 by RT PCR NEGATIVE NEGATIVE  Final    Comment: (NOTE) SARS-CoV-2 target nucleic acids are NOT DETECTED.  The SARS-CoV-2 RNA is generally detectable in upper respiratory specimens during the acute phase of infection. The lowest concentration of SARS-CoV-2 viral copies this assay can detect is 138 copies/mL. A negative result does not preclude SARS-Cov-2 infection and should not be used as the sole basis for treatment or other patient management decisions. A negative result may occur with  improper specimen collection/handling, submission of specimen other than nasopharyngeal swab, presence of viral mutation(s) within the areas targeted by this assay, and inadequate  number of viral copies(<138 copies/mL). A negative result must be combined with clinical observations, patient history, and epidemiological information. The expected result is Negative.  Fact Sheet for Patients:  BloggerCourse.com  Fact Sheet for Healthcare Providers:  SeriousBroker.it  This test is no t yet approved or cleared by the [place] FDA and  has been authorized for detection and/or diagnosis of SARS-CoV-2 by FDA under an Emergency Use Authorization (EUA). This EUA will remain  in effect (meaning this test can be used) for the duration of the COVID-19 declaration under Section 564(b)(1) of the Act, 21 U.S.C.section 360bbb-3(b)(1), unless the authorization is terminated  or revoked sooner.       Influenza A by PCR NEGATIVE NEGATIVE Final   Influenza B by PCR NEGATIVE NEGATIVE Final    Comment: (NOTE) The Xpert Xpress SARS-CoV-2/FLU/RSV plus assay is intended as an aid in the diagnosis of influenza from Nasopharyngeal swab specimens and should not be used as a sole basis for treatment. Nasal washings and aspirates are unacceptable for Xpert Xpress SARS-CoV-2/FLU/RSV testing.  Fact Sheet for Patients: BloggerCourse.com  Fact Sheet for Healthcare Providers: SeriousBroker.it  This test is not yet approved or cleared by the [place] FDA and has been authorized for detection and/or diagnosis of SARS-CoV-2 by FDA under an Emergency Use Authorization (EUA). This EUA will remain in effect (meaning this test can be used) for the duration of the COVID-19 declaration under Section 564(b)(1) of the Act, 21 U.S.C. section 360bbb-3(b)(1), unless the authorization is terminated or revoked.     Resp Syncytial Virus by PCR NEGATIVE NEGATIVE Final    Comment: (NOTE) Fact Sheet for Patients: BloggerCourse.com  Fact Sheet for Healthcare  Providers: SeriousBroker.it  This test is not yet approved or cleared by the [place] FDA and has been authorized for detection and/or diagnosis of SARS-CoV-2 by FDA under an Emergency Use Authorization (EUA). This EUA will remain in effect (meaning this test can be used) for the duration of the COVID-19 declaration under Section 564(b)(1) of the Act, 21 U.S.C. section 360bbb-3(b)(1), unless the authorization is terminated or revoked.  Performed at [facility], [address] [place], [place] [address]   MRSA Next Gen by PCR, Nasal     Status: None   Collection Time: 09/16/23 12:43 PM   Specimen: Nasal Mucosa; Nasal Swab  Result Value Ref Range Status   MRSA by PCR Next Gen NOT DETECTED NOT DETECTED Final    Comment: (NOTE) The GeneXpert MRSA Assay (FDA approved for NASAL specimens only), is one component of a comprehensive MRSA colonization surveillance program. It is not intended to diagnose MRSA infection nor to guide or monitor treatment for MRSA infections. Test performance is not FDA approved in patients less than 44 years old. Performed at [facility], [address] [place], [place] [address]     Coagulation Studies: No results for input(s): "LABPROT", "INR" in the last 72  hours.  Urinalysis: No results for input(s): "COLORURINE", "LABSPEC", "PHURINE", "GLUCOSEU", "HGBUR", "BILIRUBINUR", "KETONESUR", "PROTEINUR", "UROBILINOGEN", "NITRITE", "LEUKOCYTESUR" in the last 72 hours.  Invalid input(s): "APPERANCEUR"    Imaging: CT HEAD WO CONTRAST ( )  Result Date: 09/16/2023 CLINICAL DATA:  Subarachnoid hemorrhage. EXAM: CT HEAD WITHOUT CONTRAST TECHNIQUE: Contiguous axial images were obtained from the base of the skull through the vertex without intravenous contrast. RADIATION DOSE REDUCTION: This exam was performed according to the departmental dose-optimization program which includes automated exposure control,  adjustment of the mA and/or kV according to patient size and/or use of iterative reconstruction technique. COMPARISON:  Head CT 06/11/2023.  MRI brain 01/05/2022. 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 Tract from prior left frontal approach ventriculostomy. Cortical gray-white differentiation is otherwise preserved. No acute hemorrhage. No hydrocephalus or extra-axial collection. No mass effect or midline shift. Vascular: Prior coil embolization and clipping of the aneurysm along the right ICA terminus. Skull: Prior right pterional craniotomy. Sinuses/Orbits: Moderate right-greater-than-left maxillary sinus disease. Orbits are unremarkable. Other: None. IMPRESSION: 1. No acute intracranial abnormality. 2. 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 3. Moderate right-greater-than-left maxillary sinus disease. Electronically Signed   By: Orvan Falconer M.D.   On: 09/16/2023 20:16     Medications:      carvedilol  25 mg Oral BID WC   Chlorhexidine Gluconate Cloth  6 each Topical Q0600   DULoxetine  60 mg Oral Daily   fluticasone furoate-vilanterol  1 puff Inhalation Daily   And   umeclidinium bromide  1 puff Inhalation Daily   heparin  5,000 Units Subcutaneous Q8H   hydrALAZINE  100 mg Oral Q8H   lidocaine-prilocaine  1 Application Topical Daily   nicotine  21 mg Transdermal Daily   pantoprazole (PROTONIX) IV  40 mg Intravenous Q24H   predniSONE  40 mg Oral Daily   sodium chloride flush  3 mL Intravenous Q12H   acetaminophen, albuterol, ondansetron (ZOFRAN) IV  Assessment/ Plan:  Derek Cooley. is a 57 y.o.  male with past medical conditions including COPD on home oxygen at 3 L, diabetes, struct of sleep apnea, anemia, hypertension, tobacco use,  polysubstance abuse, CHF, end-stage renal disease on hemodialysis. Patient presents to emergency department with shortness of breath and has been admitted for Infestation by bed bug [B88.8] CHF (congestive heart failure) (HCC) [I50.9] Elevated troponin level [R79.89] ESRD (end stage renal disease) on dialysis (HCC) [N18.6, Z99.2] Acute and chronic respiratory failure with hypercapnia (HCC) [J96.22] Hypertensive emergency [I16.1]  [facility]/TTS/Lt AVF  End stage renal disease on dialysis. Patient received treatment yesterday, 2.1L achieved. Next treatment scheduled Thursday.   2. Acute respiratory failure due to missed dialysis treatment. Returned to baseline oxygen, 3L [place]  3. Hypertensive emergency, Blood pressure 202/104. Placed on Nitroglycerin drip in ED. Weaned off Niro drip. Blood pressure remains 142/61.   4. Diabetes mellitus type II with chronic kidney disease/renal manifestations:noninsulin dependent. Most recent hemoglobin A1c is 5.5 on 06/10/23.   5. Secondary Hyperparathyroidism: with outpatient labs: None  Lab Results  Component Value Date   PTH 135 (H) 12/07/2021   PTH Comment 12/07/2021   CALCIUM 7.8 (L) 09/18/2023   PHOS 6.9 (H) 09/17/2023    Hyperphosphatemia noted. Will slowly improve with dialysis   LOS: 2   10/9/202412:05 PM

## 2023-09-25 ENCOUNTER — Ambulatory Visit: Payer: Medicare Other | Admitting: Internal Medicine

## 2023-09-27 ENCOUNTER — Inpatient Hospital Stay
Admission: EM | Admit: 2023-09-27 | Discharge: 2023-10-03 | DRG: 291 | Disposition: A | Discharge status: Home / Self Care | Payer: Medicare Other | Attending: Obstetrics and Gynecology | Admitting: Obstetrics and Gynecology

## 2023-09-27 ENCOUNTER — Other Ambulatory Visit: Payer: Self-pay

## 2023-09-27 ENCOUNTER — Encounter: Payer: Self-pay | Admitting: Emergency Medicine

## 2023-09-27 ENCOUNTER — Emergency Department: Payer: Medicare Other

## 2023-09-27 DIAGNOSIS — F17210 Nicotine dependence, cigarettes, uncomplicated: Secondary | ICD-10-CM | POA: Diagnosis present

## 2023-09-27 DIAGNOSIS — E876 Hypokalemia: Secondary | ICD-10-CM | POA: Diagnosis present

## 2023-09-27 DIAGNOSIS — T465X6A Underdosing of other antihypertensive drugs, initial encounter: Secondary | ICD-10-CM | POA: Diagnosis present

## 2023-09-27 DIAGNOSIS — Z881 Allergy status to other antibiotic agents status: Secondary | ICD-10-CM

## 2023-09-27 DIAGNOSIS — I10 Essential (primary) hypertension: Secondary | ICD-10-CM | POA: Diagnosis not present

## 2023-09-27 DIAGNOSIS — E1122 Type 2 diabetes mellitus with diabetic chronic kidney disease: Secondary | ICD-10-CM | POA: Diagnosis present

## 2023-09-27 DIAGNOSIS — F32A Depression, unspecified: Secondary | ICD-10-CM | POA: Diagnosis present

## 2023-09-27 DIAGNOSIS — Z885 Allergy status to narcotic agent status: Secondary | ICD-10-CM

## 2023-09-27 DIAGNOSIS — I5033 Acute on chronic diastolic (congestive) heart failure: Secondary | ICD-10-CM | POA: Diagnosis present

## 2023-09-27 DIAGNOSIS — E785 Hyperlipidemia, unspecified: Secondary | ICD-10-CM | POA: Diagnosis present

## 2023-09-27 DIAGNOSIS — I132 Hypertensive heart and chronic kidney disease with heart failure and with stage 5 chronic kidney disease, or end stage renal disease: Secondary | ICD-10-CM | POA: Diagnosis present

## 2023-09-27 DIAGNOSIS — I2489 Other forms of acute ischemic heart disease: Secondary | ICD-10-CM | POA: Diagnosis present

## 2023-09-27 DIAGNOSIS — I444 Left anterior fascicular block: Secondary | ICD-10-CM | POA: Diagnosis present

## 2023-09-27 DIAGNOSIS — I5030 Unspecified diastolic (congestive) heart failure: Secondary | ICD-10-CM | POA: Diagnosis not present

## 2023-09-27 DIAGNOSIS — Z8679 Personal history of other diseases of the circulatory system: Secondary | ICD-10-CM

## 2023-09-27 DIAGNOSIS — R0902 Hypoxemia: Principal | ICD-10-CM

## 2023-09-27 DIAGNOSIS — I214 Non-ST elevation (NSTEMI) myocardial infarction: Secondary | ICD-10-CM | POA: Diagnosis present

## 2023-09-27 DIAGNOSIS — T424X5A Adverse effect of benzodiazepines, initial encounter: Secondary | ICD-10-CM | POA: Diagnosis not present

## 2023-09-27 DIAGNOSIS — I4729 Other ventricular tachycardia: Secondary | ICD-10-CM | POA: Diagnosis not present

## 2023-09-27 DIAGNOSIS — K219 Gastro-esophageal reflux disease without esophagitis: Secondary | ICD-10-CM | POA: Insufficient documentation

## 2023-09-27 DIAGNOSIS — Z8261 Family history of arthritis: Secondary | ICD-10-CM

## 2023-09-27 DIAGNOSIS — J439 Emphysema, unspecified: Secondary | ICD-10-CM | POA: Diagnosis present

## 2023-09-27 DIAGNOSIS — Z992 Dependence on renal dialysis: Secondary | ICD-10-CM

## 2023-09-27 DIAGNOSIS — N186 End stage renal disease: Secondary | ICD-10-CM | POA: Diagnosis present

## 2023-09-27 DIAGNOSIS — I161 Hypertensive emergency: Secondary | ICD-10-CM | POA: Diagnosis present

## 2023-09-27 DIAGNOSIS — Z818 Family history of other mental and behavioral disorders: Secondary | ICD-10-CM

## 2023-09-27 DIAGNOSIS — R4781 Slurred speech: Secondary | ICD-10-CM | POA: Diagnosis not present

## 2023-09-27 DIAGNOSIS — Z79899 Other long term (current) drug therapy: Secondary | ICD-10-CM

## 2023-09-27 DIAGNOSIS — E1151 Type 2 diabetes mellitus with diabetic peripheral angiopathy without gangrene: Secondary | ICD-10-CM | POA: Diagnosis present

## 2023-09-27 DIAGNOSIS — IMO0001 Reserved for inherently not codable concepts without codable children: Secondary | ICD-10-CM

## 2023-09-27 DIAGNOSIS — M75100 Unspecified rotator cuff tear or rupture of unspecified shoulder, not specified as traumatic: Secondary | ICD-10-CM | POA: Diagnosis present

## 2023-09-27 DIAGNOSIS — J9611 Chronic respiratory failure with hypoxia: Secondary | ICD-10-CM | POA: Diagnosis present

## 2023-09-27 DIAGNOSIS — N2581 Secondary hyperparathyroidism of renal origin: Secondary | ICD-10-CM | POA: Diagnosis present

## 2023-09-27 DIAGNOSIS — Z7951 Long term (current) use of inhaled steroids: Secondary | ICD-10-CM

## 2023-09-27 DIAGNOSIS — G8929 Other chronic pain: Secondary | ICD-10-CM | POA: Diagnosis present

## 2023-09-27 DIAGNOSIS — J441 Chronic obstructive pulmonary disease with (acute) exacerbation: Secondary | ICD-10-CM | POA: Diagnosis present

## 2023-09-27 DIAGNOSIS — F419 Anxiety disorder, unspecified: Secondary | ICD-10-CM | POA: Diagnosis present

## 2023-09-27 DIAGNOSIS — G4733 Obstructive sleep apnea (adult) (pediatric): Secondary | ICD-10-CM | POA: Diagnosis present

## 2023-09-27 DIAGNOSIS — E669 Obesity, unspecified: Secondary | ICD-10-CM | POA: Diagnosis present

## 2023-09-27 DIAGNOSIS — Z91138 Patient's unintentional underdosing of medication regimen for other reason: Secondary | ICD-10-CM

## 2023-09-27 DIAGNOSIS — R400 Somnolence: Secondary | ICD-10-CM | POA: Diagnosis not present

## 2023-09-27 DIAGNOSIS — I493 Ventricular premature depolarization: Secondary | ICD-10-CM | POA: Diagnosis not present

## 2023-09-27 DIAGNOSIS — Z91158 Patient's noncompliance with renal dialysis for other reason: Secondary | ICD-10-CM

## 2023-09-27 DIAGNOSIS — F17200 Nicotine dependence, unspecified, uncomplicated: Secondary | ICD-10-CM | POA: Diagnosis not present

## 2023-09-27 DIAGNOSIS — R7989 Other specified abnormal findings of blood chemistry: Secondary | ICD-10-CM | POA: Diagnosis not present

## 2023-09-27 DIAGNOSIS — Z833 Family history of diabetes mellitus: Secondary | ICD-10-CM

## 2023-09-27 DIAGNOSIS — Z8249 Family history of ischemic heart disease and other diseases of the circulatory system: Secondary | ICD-10-CM

## 2023-09-27 DIAGNOSIS — D631 Anemia in chronic kidney disease: Secondary | ICD-10-CM | POA: Diagnosis present

## 2023-09-27 DIAGNOSIS — R0602 Shortness of breath: Secondary | ICD-10-CM | POA: Diagnosis present

## 2023-09-27 DIAGNOSIS — Z888 Allergy status to other drugs, medicaments and biological substances status: Secondary | ICD-10-CM

## 2023-09-27 DIAGNOSIS — I472 Ventricular tachycardia, unspecified: Secondary | ICD-10-CM | POA: Diagnosis not present

## 2023-09-27 DIAGNOSIS — Z83438 Family history of other disorder of lipoprotein metabolism and other lipidemia: Secondary | ICD-10-CM

## 2023-09-27 DIAGNOSIS — Z8349 Family history of other endocrine, nutritional and metabolic diseases: Secondary | ICD-10-CM

## 2023-09-27 DIAGNOSIS — Z841 Family history of disorders of kidney and ureter: Secondary | ICD-10-CM

## 2023-09-27 DIAGNOSIS — I2510 Atherosclerotic heart disease of native coronary artery without angina pectoris: Secondary | ICD-10-CM | POA: Diagnosis present

## 2023-09-27 DIAGNOSIS — Z825 Family history of asthma and other chronic lower respiratory diseases: Secondary | ICD-10-CM

## 2023-09-27 DIAGNOSIS — I160 Hypertensive urgency: Secondary | ICD-10-CM | POA: Diagnosis not present

## 2023-09-27 DIAGNOSIS — Z8614 Personal history of Methicillin resistant Staphylococcus aureus infection: Secondary | ICD-10-CM

## 2023-09-27 LAB — CBC
HCT: 30.6 % — ABNORMAL LOW (ref 39.0–52.0)
Hemoglobin: 10 g/dL — ABNORMAL LOW (ref 13.0–17.0)
MCH: 32.5 pg (ref 26.0–34.0)
MCHC: 32.7 g/dL (ref 30.0–36.0)
MCV: 99.4 fL (ref 80.0–100.0)
Platelets: 201 10*3/uL (ref 150–400)
RBC: 3.08 MIL/uL — ABNORMAL LOW (ref 4.22–5.81)
RDW: 14 % (ref 11.5–15.5)
WBC: 8.3 10*3/uL (ref 4.0–10.5)
nRBC: 0 % (ref 0.0–0.2)

## 2023-09-27 LAB — HEPATIC FUNCTION PANEL
ALT: 23 U/L (ref 0–44)
AST: 19 U/L (ref 15–41)
Albumin: 3.6 g/dL (ref 3.5–5.0)
Alkaline Phosphatase: 82 U/L (ref 38–126)
Bilirubin, Direct: 0.1 mg/dL (ref 0.0–0.2)
Indirect Bilirubin: 0.9 mg/dL (ref 0.3–0.9)
Total Bilirubin: 1 mg/dL (ref 0.3–1.2)
Total Protein: 6.8 g/dL (ref 6.5–8.1)

## 2023-09-27 LAB — BASIC METABOLIC PANEL
Anion gap: 12 (ref 5–15)
BUN: 96 mg/dL — ABNORMAL HIGH (ref 6–20)
CO2: 24 mmol/L (ref 22–32)
Calcium: 8.1 mg/dL — ABNORMAL LOW (ref 8.9–10.3)
Chloride: 106 mmol/L (ref 98–111)
Creatinine, Ser: 6.93 mg/dL — ABNORMAL HIGH (ref 0.61–1.24)
GFR, Estimated: 9 mL/min — ABNORMAL LOW (ref >60)
Glucose, Bld: 138 mg/dL — ABNORMAL HIGH (ref 70–99)
Potassium: 4 mmol/L (ref 3.5–5.1)
Sodium: 142 mmol/L (ref 135–145)

## 2023-09-27 LAB — TROPONIN I (HIGH SENSITIVITY): Troponin I (High Sensitivity): 553 ng/L (ref <18)

## 2023-09-27 LAB — BRAIN NATRIURETIC PEPTIDE: B Natriuretic Peptide: 573.4 pg/mL — ABNORMAL HIGH (ref 0.0–100.0)

## 2023-09-27 MED ORDER — HEPARIN (PORCINE) 25000 UT/250ML-% IV SOLN
1600.0000 [IU]/h | INTRAVENOUS | Status: AC
  Administered 2023-09-28 (×2): 1150 [IU]/h via INTRAVENOUS
  Administered 2023-09-29: 1400 [IU]/h via INTRAVENOUS
  Filled 2023-09-27 (×3): qty 250

## 2023-09-27 MED ORDER — NITROGLYCERIN IN D5W 200-5 MCG/ML-% IV SOLN
0.0000 ug/min | INTRAVENOUS | Status: DC
  Administered 2023-09-27: 5 ug/min via INTRAVENOUS
  Administered 2023-09-28: 115 ug/min via INTRAVENOUS
  Administered 2023-09-28: 150 ug/min via INTRAVENOUS
  Administered 2023-09-29: 105 ug/min via INTRAVENOUS
  Filled 2023-09-27 (×5): qty 250

## 2023-09-27 MED ORDER — ALBUTEROL SULFATE (2.5 MG/3ML) 0.083% IN NEBU
2.5000 mg | INHALATION_SOLUTION | RESPIRATORY_TRACT | Status: DC | PRN
  Administered 2023-09-27: 2.5 mg via RESPIRATORY_TRACT
  Filled 2023-09-27: qty 3

## 2023-09-27 MED ORDER — FUROSEMIDE 10 MG/ML IJ SOLN
60.0000 mg | Freq: Once | INTRAMUSCULAR | Status: AC
  Administered 2023-09-27: 60 mg via INTRAVENOUS
  Filled 2023-09-27: qty 8

## 2023-09-27 MED ORDER — IPRATROPIUM-ALBUTEROL 0.5-2.5 (3) MG/3ML IN SOLN
9.0000 mL | Freq: Once | RESPIRATORY_TRACT | Status: AC
  Administered 2023-09-27: 9 mL via RESPIRATORY_TRACT
  Filled 2023-09-27: qty 3

## 2023-09-27 MED ORDER — METHYLPREDNISOLONE SODIUM SUCC 125 MG IJ SOLR
125.0000 mg | Freq: Once | INTRAMUSCULAR | Status: AC
  Administered 2023-09-27: 125 mg via INTRAVENOUS
  Filled 2023-09-27: qty 2

## 2023-09-27 MED ORDER — NITROGLYCERIN 2 % TD OINT
1.0000 [in_us] | TOPICAL_OINTMENT | Freq: Once | TRANSDERMAL | Status: AC
  Administered 2023-09-27: 1 [in_us] via TOPICAL
  Filled 2023-09-27: qty 1

## 2023-09-27 MED ORDER — HEPARIN BOLUS VIA INFUSION
4000.0000 [IU] | Freq: Once | INTRAVENOUS | Status: AC
  Administered 2023-09-28: 4000 [IU] via INTRAVENOUS
  Filled 2023-09-27: qty 4000

## 2023-09-27 NOTE — ED Notes (Signed)
Critical Trop 553 - Provider notified.

## 2023-09-27 NOTE — ED Provider Notes (Addendum)
[facility] Provider Note    Event Date/Time   First MD Initiated Contact with Patient 09/27/23 2215     (approximate)   History   Shortness of Breath   HPI  Derek Cooley. is a 57 y.o. male past medical significant for ESRD on HD TThSa, hypertension, polysubstance abuse, prior cerebral aneurysm, presents to the emergency department with shortness of breath.  Patient states that he has been feeling shortness of breath for the past 1 week.  Then states maybe has been feeling short of breath longer than that.  He is uncertain of the last time he went to dialysis.  States that he did not go to dialysis because he was "sick".  States that he has been having shortness of breath.  Denies any chest pain at this time.  Denies headache.  States he does still make urine.  Patient falling asleep multiple times and requiring redirecting to get further history  On chart review patient was discharged on 09/18/2023 following a similar episode of shortness of breath, headache and missing dialysis.  States that he has been running out of his blood pressure medications at that time.  Patient was on BiPAP at that time.  History of noncompliance with medications.     Physical Exam   Triage Vital Signs: ED Triage Vitals  Encounter Vitals Group     BP 09/27/23 2025 (!) 242/116     Systolic BP Percentile --      Diastolic BP Percentile --      Pulse Rate 09/27/23 2025 96     Resp 09/27/23 2025 (!) 26     Temp 09/27/23 2025 98 F (36.7 C)     Temp Source 09/27/23 2025 Oral     SpO2 09/27/23 2025 (!) 84 %     Weight --      Height --      Head Circumference --      Peak Flow --      Pain Score 09/27/23 2026 0     Pain Loc --      Pain Education --      Exclude from Growth Chart --     Most recent vital signs: Vitals:   09/27/23 2025 09/27/23 2026  BP: (!) 242/116   Pulse: 96   Resp: (!) 26   Temp: 98 F (36.7 C)   SpO2: (!) 84% 96%    Physical  Exam Constitutional:      Appearance: He is well-developed.  HENT:     Head: Atraumatic.  Eyes:     Extraocular Movements: Extraocular movements intact.     Conjunctiva/sclera: Conjunctivae normal.     Pupils: Pupils are equal, round, and reactive to light.  Cardiovascular:     Rate and Rhythm: Regular rhythm.  Pulmonary:     Effort: Respiratory distress present.     Breath sounds: Wheezing and rales present.     Comments: Diffuse wheezing.  84% on room air, placed on 4 L nasal cannula with improvement to 96%. Musculoskeletal:     Cervical back: Normal range of motion.  Skin:    General: Skin is warm.     Comments: Left AV fistula with palpable thrill  Neurological:     General: No focal deficit present.     Mental Status: He is alert. Mental status is at baseline.     Comments: Falling asleep, easily arousable.  Cranial nerves intact.  5/5 strength bilateral upper and lower extremities.  IMPRESSION / MDM / ASSESSMENT AND PLAN / ED COURSE  I reviewed the triage vital signs and the nursing notes.  Differential diagnosis including heart failure, hypertensive emergency, ACS, pneumonia, COPD exacerbation, electrolyte abnormality, anemia  EKG  I, Corena Herter, the attending physician, personally viewed and interpreted this ECG.   Rate: Normal  Rhythm: Normal sinus  Axis: Normal  Intervals: Normal  ST&T Change: None  No tachycardic or bradycardic dysrhythmias while on cardiac telemetry.  RADIOLOGY I independently reviewed imaging, my interpretation of imaging: Chest x-ray without obvious pulmonary edema.  Read as no acute findings.  LABS (all labs ordered are listed, but only abnormal results are displayed) Labs interpreted as -    Labs Reviewed  BASIC METABOLIC PANEL - Abnormal; Notable for the following components:      Result Value   Glucose, Bld 138 (*)    BUN 96 (*)    Creatinine, Ser 6.93 (*)    Calcium 8.1 (*)    GFR, Estimated 9 (*)    All other  components within normal limits  CBC - Abnormal; Notable for the following components:   RBC 3.08 (*)    Hemoglobin 10.0 (*)    HCT 30.6 (*)    All other components within normal limits  BRAIN NATRIURETIC PEPTIDE - Abnormal; Notable for the following components:   B Natriuretic Peptide 573.4 (*)    All other components within normal limits  TROPONIN I (HIGH SENSITIVITY) - Abnormal; Notable for the following components:   Troponin I (High Sensitivity) 553 (*)    All other components within normal limits  HEPATIC FUNCTION PANEL     MDM  Patient significantly hypertensive in the emergency department.  Acute hypoxia.  Creatinine elevated at 6.9.  Elevated BUN of 96 which is likely the cause of his somnolence.  Has a nonfocal neurologic exam but will obtain a CT scan of the head to further evaluate for possible intracranial hemorrhage.  Consulted and discussed the patient's case with nephrology on-call Dr. Micki Riley, recommended IV Lasix, blood pressure control and admission to the hospitalist with plan for urgent dialysis in the morning  Ongoing significant hypertension.  Started on nitro infusion.  Given IV Lasix given that he still makes urine.  Troponin resulted elevated at 500, no chest pain and no EKG changes, likely demand ischemia, started on heparin infusion for possible NSTEMI.[facility] hospitalist for admission.     PROCEDURES:  Critical Care performed: yes  .Critical Care  Performed by: Corena Herter, MD Authorized by: Corena Herter, MD   Critical care provider statement:    Critical care time (minutes):  70   Critical care time was exclusive of:  Separately billable procedures and treating other patients   Critical care was necessary to treat or prevent imminent or life-threatening deterioration of the following conditions:  Renal failure and respiratory failure   Critical care was time spent personally by me on the following activities:  Development of treatment  plan with patient or surrogate, discussions with consultants, evaluation of patient's response to treatment, examination of patient, ordering and review of laboratory studies, ordering and review of radiographic studies, ordering and performing treatments and interventions, pulse oximetry, re-evaluation of patient's condition and review of old charts   Patient's presentation is most consistent with acute presentation with potential threat to life or bodily function.   MEDICATIONS ORDERED IN ED: Medications  albuterol (PROVENTIL) (2.5 MG/3ML) 0.083% nebulizer solution 2.5 mg (2.5 mg Inhalation Given 09/27/23 2212)  nitroGLYCERIN 50  mg in dextrose 5 % 250 mL (0.2 mg/mL) infusion (has no administration in time range)  ipratropium-albuterol (DUONEB) 0.5-2.5 (3) MG/3ML nebulizer solution 9 mL (9 mLs Nebulization Given 09/27/23 2237)  methylPREDNISolone sodium succinate (SOLU-MEDROL) 125 mg/2 mL injection 125 mg (125 mg Intravenous Given 09/27/23 2237)  nitroGLYCERIN (NITROGLYN) 2 % ointment 1 inch (1 inch Topical Given 09/27/23 2311)  furosemide (LASIX) injection 60 mg (60 mg Intravenous Given 09/27/23 2311)    FINAL CLINICAL IMPRESSION(S) / ED DIAGNOSES   Final diagnoses:  Hypoxia  Hypertensive emergency  COPD exacerbation (HCC)     Rx / DC Orders   ED Discharge Orders     None        Note:  This document was prepared using Dragon voice recognition software and may include unintentional dictation errors.   Corena Herter, MD 09/27/23 4098    Corena Herter, MD 09/27/23 1191    Corena Herter, MD 09/27/23 4782    Corena Herter, MD 09/27/23 2353

## 2023-09-27 NOTE — ED Triage Notes (Signed)
Pt reports SHOB and feeling "off" for a few weeks. Pt reports he hasn't been to dialysis in 1 week. Pt reports he has been weak and "seeing numbers" Pt supposed to wear O2 but reports he ran out.

## 2023-09-27 NOTE — Progress Notes (Signed)
ANTICOAGULATION CONSULT NOTE  Pharmacy Consult for heparin infusion Indication: ACS/STEMI  Allergies  Allergen Reactions   Atorvastatin     Joint Aches - Severe Joint Aches - Severe Joint Aches - Severe   Avelox [Moxifloxacin Hcl In Nacl]     Muscle pain   Buprenorphine     Mouth sores, confusion, shaking   Dilaudid [Hydromorphone Hcl] Hives   Fluoxetine Itching   Levofloxacin Other (See Comments)    Joint Pain   Morphine And Codeine    Other     Muscle pain   Suboxone [Buprenorphine Hcl-Naloxone Hcl] Other (See Comments)    Rash and confused   Vancomycin     Renal insufficiency    Patient Measurements:   Heparin Dosing Weight: 86.1 kg  Vital Signs: Temp: 98 F (36.7 C) (10/18 2025) Temp Source: Oral (10/18 2025) BP: 225/103 (10/18 2345) Pulse Rate: 89 (10/18 2345)  Labs: Recent Labs    09/27/23 2036 09/27/23 2251  HGB 10.0*  --   HCT 30.6*  --   PLT 201  --   CREATININE 6.93*  --   TROPONINIHS  --  553*    Estimated Creatinine Clearance: 12.9 mL/min (A) (by C-G formula based on SCr of 6.93 mg/dL (H)).   Medical History: Past Medical History:  Diagnosis Date   Abdominal abscess    a.) chronic MRSA infection   Acute pancreatitis    Anxiety    Asthma    Brain aneurysm    a.) 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 b.) RIGHT crainotomy for clip ligation 05/14/2019.   CHF (congestive heart failure) (HCC)    Chronic back pain    Chronic heart failure with preserved ejection fraction (HFpEF) (HCC)    a. 11/2021 Echo: EF 55-60%, no rwma, mod LVH, GrI DD. Nl RV size/fxn. Mildly dil LA.   CKD (chronic kidney disease), stage V (HCC)    Depression    Emphysema of lung (HCC)    Erectile dysfunction    Followed by palliative care service    GERD (gastroesophageal reflux disease)    History of methicillin resistant staphylococcus aureus (MRSA)    HLD (hyperlipidemia)    Hypertension     Mild cognitive impairment    MRSA (methicillin resistant Staphylococcus aureus)    Obesity    OSA (obstructive sleep apnea) 2013   a.) not compliant with nocturnal PAP therapy; CPAP machine "lost or stolen".   Panic disorder    Perforated bowel (HCC) 12/10/2005   Tempoary Colostomy Bag, Skin Graft for Abd wound   Polysubstance abuse (HCC)    a.) ETOH, tobacco, marijuana, methamphetamines, cocaine, BZO, opioids.   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   T2DM (type 2 diabetes mellitus) (HCC)    Tobacco abuse    Type 2 diabetes mellitus without complication, without long-term current use of insulin (HCC) 04/17/2016   Vitreous hemorrhage (HCC) 03/27/2011   Overview:  Bilateral; 01/2010, from [facility]     Assessment: Pt is a 57 yo male w/ ESRD on HD presenting to ED c/o weakness, SOB, & "seeing numbers"  found w/ elevated Troponin I level.  Pt reports he has not been to dialysis for 1 wk.  Goal of Therapy:  Heparin level 0.3-0.7 units/ml Monitor platelets by anticoagulation protocol: Yes   Plan:  Bolus 4000 units x 1 Start heparin  infusion at 1150 units/hr Will check HL in 8 hr after start of infusion CBC daily while on heparin  Otelia Sergeant, PharmD, [facility] 09/27/2023 11:55 PM

## 2023-09-28 DIAGNOSIS — E1122 Type 2 diabetes mellitus with diabetic chronic kidney disease: Secondary | ICD-10-CM

## 2023-09-28 DIAGNOSIS — I4729 Other ventricular tachycardia: Secondary | ICD-10-CM

## 2023-09-28 DIAGNOSIS — F32A Depression, unspecified: Secondary | ICD-10-CM | POA: Insufficient documentation

## 2023-09-28 DIAGNOSIS — I5030 Unspecified diastolic (congestive) heart failure: Secondary | ICD-10-CM | POA: Diagnosis not present

## 2023-09-28 DIAGNOSIS — F17200 Nicotine dependence, unspecified, uncomplicated: Secondary | ICD-10-CM | POA: Diagnosis not present

## 2023-09-28 DIAGNOSIS — N186 End stage renal disease: Secondary | ICD-10-CM

## 2023-09-28 DIAGNOSIS — K219 Gastro-esophageal reflux disease without esophagitis: Secondary | ICD-10-CM | POA: Insufficient documentation

## 2023-09-28 DIAGNOSIS — I160 Hypertensive urgency: Secondary | ICD-10-CM

## 2023-09-28 DIAGNOSIS — I10 Essential (primary) hypertension: Secondary | ICD-10-CM

## 2023-09-28 DIAGNOSIS — E785 Hyperlipidemia, unspecified: Secondary | ICD-10-CM

## 2023-09-28 LAB — BASIC METABOLIC PANEL
Anion gap: 13 (ref 5–15)
BUN: 95 mg/dL — ABNORMAL HIGH (ref 6–20)
CO2: 22 mmol/L (ref 22–32)
Calcium: 8 mg/dL — ABNORMAL LOW (ref 8.9–10.3)
Chloride: 104 mmol/L (ref 98–111)
Creatinine, Ser: 7.03 mg/dL — ABNORMAL HIGH (ref 0.61–1.24)
GFR, Estimated: 8 mL/min — ABNORMAL LOW (ref >60)
Glucose, Bld: 198 mg/dL — ABNORMAL HIGH (ref 70–99)
Potassium: 4.4 mmol/L (ref 3.5–5.1)
Sodium: 139 mmol/L (ref 135–145)

## 2023-09-28 LAB — CBC
HCT: 29.2 % — ABNORMAL LOW (ref 39.0–52.0)
Hemoglobin: 9.5 g/dL — ABNORMAL LOW (ref 13.0–17.0)
MCH: 31.9 pg (ref 26.0–34.0)
MCHC: 32.5 g/dL (ref 30.0–36.0)
MCV: 98 fL (ref 80.0–100.0)
Platelets: 205 10*3/uL (ref 150–400)
RBC: 2.98 MIL/uL — ABNORMAL LOW (ref 4.22–5.81)
RDW: 14 % (ref 11.5–15.5)
WBC: 8.4 10*3/uL (ref 4.0–10.5)
nRBC: 0 % (ref 0.0–0.2)

## 2023-09-28 LAB — PROTIME-INR
INR: 1.2 (ref 0.8–1.2)
Prothrombin Time: 15.5 s — ABNORMAL HIGH (ref 11.4–15.2)

## 2023-09-28 LAB — GLUCOSE, CAPILLARY
Glucose-Capillary: 200 mg/dL — ABNORMAL HIGH (ref 70–99)
Glucose-Capillary: 167 mg/dL — ABNORMAL HIGH (ref 70–99)
Glucose-Capillary: 115 mg/dL — ABNORMAL HIGH (ref 70–99)
Glucose-Capillary: 140 mg/dL — ABNORMAL HIGH (ref 70–99)

## 2023-09-28 LAB — LIPID PANEL
Cholesterol: 149 mg/dL (ref 0–200)
HDL: 51 mg/dL (ref >40)
LDL Cholesterol: 84 mg/dL (ref 0–99)
Total CHOL/HDL Ratio: 2.9 {ratio}
Triglycerides: 69 mg/dL (ref <150)
VLDL: 14 mg/dL (ref 0–40)

## 2023-09-28 LAB — HEPARIN LEVEL (UNFRACTIONATED)
Heparin Unfractionated: <0.1 [IU]/mL — ABNORMAL LOW (ref 0.30–0.70)
Heparin Unfractionated: 0.13 [IU]/mL — ABNORMAL LOW (ref 0.30–0.70)

## 2023-09-28 LAB — HEPATITIS B SURFACE ANTIGEN: Hepatitis B Surface Ag: NONREACTIVE

## 2023-09-28 LAB — TROPONIN I (HIGH SENSITIVITY): Troponin I (High Sensitivity): 337 ng/L (ref <18)

## 2023-09-28 LAB — APTT: aPTT: >200 s (ref 24–36)

## 2023-09-28 LAB — MRSA NEXT GEN BY PCR, NASAL: MRSA by PCR Next Gen: NOT DETECTED

## 2023-09-28 MED ORDER — UMECLIDINIUM BROMIDE 62.5 MCG/ACT IN AEPB
1.0000 | INHALATION_SPRAY | Freq: Every day | RESPIRATORY_TRACT | Status: DC
  Administered 2023-09-28 – 2023-10-03 (×6): 1 via RESPIRATORY_TRACT
  Filled 2023-09-28: qty 7

## 2023-09-28 MED ORDER — ASPIRIN 300 MG RE SUPP: 300.0000 mg | RECTAL | Status: AC

## 2023-09-28 MED ORDER — HEPARIN BOLUS VIA INFUSION
2500.0000 [IU] | Freq: Once | INTRAVENOUS | Status: AC
  Administered 2023-09-28: 2500 [IU] via INTRAVENOUS
  Filled 2023-09-28: qty 2500

## 2023-09-28 MED ORDER — FLUTICASONE FUROATE-VILANTEROL 100-25 MCG/ACT IN AEPB
1.0000 | INHALATION_SPRAY | Freq: Every day | RESPIRATORY_TRACT | Status: DC
  Administered 2023-09-28 – 2023-10-03 (×6): 1 via RESPIRATORY_TRACT
  Filled 2023-09-28: qty 28

## 2023-09-28 MED ORDER — PANTOPRAZOLE SODIUM 40 MG PO TBEC
40.0000 mg | DELAYED_RELEASE_TABLET | Freq: Every day | ORAL | Status: DC
  Administered 2023-09-28 – 2023-10-03 (×6): 40 mg via ORAL
  Filled 2023-09-28 (×6): qty 1

## 2023-09-28 MED ORDER — ACETAMINOPHEN 325 MG PO TABS
650.0000 mg | ORAL_TABLET | ORAL | Status: DC | PRN
  Administered 2023-09-30 – 2023-10-03 (×5): 650 mg via ORAL
  Filled 2023-09-28 (×5): qty 2

## 2023-09-28 MED ORDER — TOPIRAMATE 25 MG PO TABS
50.0000 mg | ORAL_TABLET | Freq: Two times a day (BID) | ORAL | Status: DC
  Administered 2023-09-28 – 2023-10-02 (×9): 50 mg via ORAL
  Filled 2023-09-28 (×9): qty 2

## 2023-09-28 MED ORDER — HEPARIN SODIUM (PORCINE) 1000 UNIT/ML DIALYSIS: 1000.0000 [IU] | INTRAMUSCULAR | Status: DC | PRN

## 2023-09-28 MED ORDER — ANTICOAGULANT SODIUM CITRATE 4% (200MG/5ML) IV SOLN: 5.0000 mL | Status: DC | PRN

## 2023-09-28 MED ORDER — CHLORHEXIDINE GLUCONATE CLOTH 2 % EX PADS
6.0000 | MEDICATED_PAD | Freq: Every day | CUTANEOUS | Status: DC
  Administered 2023-09-28 – 2023-10-03 (×6): 6 via TOPICAL

## 2023-09-28 MED ORDER — MAGNESIUM HYDROXIDE 400 MG/5ML PO SUSP: 30.0000 mL | Freq: Every day | ORAL | Status: DC | PRN

## 2023-09-28 MED ORDER — INSULIN ASPART 100 UNIT/ML IJ SOLN
0.0000 [IU] | Freq: Three times a day (TID) | INTRAMUSCULAR | Status: DC
  Administered 2023-09-28: 3 [IU] via SUBCUTANEOUS
  Filled 2023-09-28: qty 1

## 2023-09-28 MED ORDER — DULOXETINE HCL 30 MG PO CPEP
60.0000 mg | ORAL_CAPSULE | Freq: Every day | ORAL | Status: DC
  Administered 2023-09-28 – 2023-10-03 (×6): 60 mg via ORAL
  Filled 2023-09-28 (×7): qty 2

## 2023-09-28 MED ORDER — CHLORHEXIDINE GLUCONATE CLOTH 2 % EX PADS
6.0000 | MEDICATED_PAD | Freq: Every day | CUTANEOUS | Status: DC
  Filled 2023-09-28: qty 6

## 2023-09-28 MED ORDER — PENTAFLUOROPROP-TETRAFLUOROETH EX AERO: 1.0000 | INHALATION_SPRAY | CUTANEOUS | Status: DC | PRN

## 2023-09-28 MED ORDER — TRAZODONE HCL 50 MG PO TABS
25.0000 mg | ORAL_TABLET | Freq: Every evening | ORAL | Status: DC | PRN
  Administered 2023-09-28 – 2023-09-29 (×2): 25 mg via ORAL
  Filled 2023-09-28 (×2): qty 1

## 2023-09-28 MED ORDER — ASPIRIN 81 MG PO CHEW
324.0000 mg | CHEWABLE_TABLET | ORAL | Status: AC
  Administered 2023-09-28: 324 mg via ORAL
  Filled 2023-09-28: qty 4

## 2023-09-28 MED ORDER — HYDRALAZINE HCL 20 MG/ML IJ SOLN
10.0000 mg | Freq: Four times a day (QID) | INTRAMUSCULAR | Status: DC | PRN
  Administered 2023-09-28: 10 mg via INTRAVENOUS
  Filled 2023-09-28: qty 1

## 2023-09-28 MED ORDER — ALPRAZOLAM 0.25 MG PO TABS
0.2500 mg | ORAL_TABLET | Freq: Two times a day (BID) | ORAL | Status: DC | PRN
  Administered 2023-09-28 – 2023-10-02 (×3): 0.25 mg via ORAL
  Filled 2023-09-28 (×3): qty 1

## 2023-09-28 MED ORDER — ALTEPLASE 2 MG IJ SOLR: 2.0000 mg | Freq: Once | INTRAMUSCULAR | Status: DC | PRN

## 2023-09-28 MED ORDER — ASPIRIN 81 MG PO TBEC
81.0000 mg | DELAYED_RELEASE_TABLET | Freq: Every day | ORAL | Status: DC
  Administered 2023-09-29 – 2023-10-03 (×5): 81 mg via ORAL
  Filled 2023-09-28 (×5): qty 1

## 2023-09-28 MED ORDER — ONDANSETRON HCL 4 MG/2ML IJ SOLN: 4.0000 mg | Freq: Four times a day (QID) | INTRAMUSCULAR | Status: DC | PRN

## 2023-09-28 MED ORDER — LOSARTAN POTASSIUM 50 MG PO TABS
100.0000 mg | ORAL_TABLET | Freq: Every day | ORAL | Status: DC
  Administered 2023-09-28 – 2023-10-03 (×6): 100 mg via ORAL
  Filled 2023-09-28 (×6): qty 2

## 2023-09-28 MED ORDER — NITROGLYCERIN 0.4 MG SL SUBL: 0.4000 mg | SUBLINGUAL_TABLET | SUBLINGUAL | Status: DC | PRN

## 2023-09-28 MED ORDER — AMLODIPINE BESYLATE 10 MG PO TABS
10.0000 mg | ORAL_TABLET | Freq: Every day | ORAL | Status: DC
  Administered 2023-09-28 – 2023-10-03 (×6): 10 mg via ORAL
  Filled 2023-09-28 (×6): qty 1

## 2023-09-28 MED ORDER — LIDOCAINE-PRILOCAINE 2.5-2.5 % EX CREA: 1.0000 | TOPICAL_CREAM | CUTANEOUS | Status: DC | PRN

## 2023-09-28 MED ORDER — LIDOCAINE HCL (PF) 1 % IJ SOLN: 5.0000 mL | INTRAMUSCULAR | Status: DC | PRN

## 2023-09-28 MED ORDER — INSULIN ASPART 100 UNIT/ML IJ SOLN: 0.0000 [IU] | Freq: Every day | INTRAMUSCULAR | Status: DC

## 2023-09-28 MED ORDER — NICOTINE 21 MG/24HR TD PT24
21.0000 mg | MEDICATED_PATCH | Freq: Every day | TRANSDERMAL | Status: DC
  Administered 2023-09-28 – 2023-10-02 (×5): 21 mg via TRANSDERMAL
  Filled 2023-09-28 (×5): qty 1

## 2023-09-28 NOTE — ED Notes (Signed)
Lab notified- will draw labs due to limited IV access and difficulty obtaining lab work.

## 2023-09-28 NOTE — Assessment & Plan Note (Signed)
-   We will continue Cymbalta and Topamax.

## 2023-09-28 NOTE — Progress Notes (Signed)
Notified Dr. Arville Care of elevated blood pressure of 185/68 received order for prn hydralazine

## 2023-09-28 NOTE — Assessment & Plan Note (Addendum)
-   She has a BNP and clinically manifest mild fluid overload. - Nephrology consult will be obtained for follow-up on hemodialysis. - I notified Dr. Micki Riley about the patient. - The patient was given a dose of IV Lasix.

## 2023-09-28 NOTE — Assessment & Plan Note (Signed)
-   The patient be placed on supplemental coverage with NovoLog

## 2023-09-28 NOTE — Progress Notes (Signed)
Hemodialysis note  0815 AM: Completed HD set up in ICU, awaiting for patient to be transferred from ED to initiate HD.  0900: Received patient in ICU. Alert and oriented.  Informed consent signed and in chart.  Treatment initiated: 0918 Treatment completed: 1258  Patient tolerated well. Alert without acute distress.  Report given to patient's RN.   Access used: Left arm AVF Access issues: none  Total UF removed: 2000 ml Medication(s) given:  none  Post HD weight: 94.8 kg   Wolfgang Phoenix [facility]

## 2023-09-28 NOTE — Assessment & Plan Note (Signed)
- 

## 2023-09-28 NOTE — Consult Note (Addendum)
Cardiology Consultation   Patient ID: Derek Cooley. MRN: [id number]; DOB: 1966-05-20  Admit date: 09/27/2023 Date of Consult: 09/28/2023  PCP:  Derek Grooms, NP   [facility] Providers Cardiologist:  Derek Bears, MD        Patient Profile:   Derek Cooley. is a 57 y.o. male with a hx of hypertension, diabetes, subarachnoid hemorrhage status postcraniotomy and clipping, sleep apnea, obesity, end-stage renal disease on hemodialysis, abdominal abscess with chronic MRSA infection, polysubstance abuse, noncompliance, COPD with continued tobacco abuse, who is being seen 09/28/2023 for the evaluation of elevated high-sensitivity troponin at the request of Dr Derek Cooley.  History of Present Illness:   Derek Cooley presented to [facility] in December 2022 with progressive dyspnea and was found to have hypertensive urgency.  Labs showed normocytic anemia with mild troponin elevation with a peak of 18.  Echo revealed a normal LVEF with moderate LVH and G1 DD.  He was discharged home on amlodipine and carvedilol with recommendation for outpatient stress testing.  He was readmitted to [facility] January 16 the setting of increased somnolence.  He was found to have rhinovirus with worsening AKI.  He was noncompliant with his medications at home of amlodipine and carvedilol were resumed on admission.  ER visit January 27 with headaches and elevated BP.  Labs showed stable CKD.  MRI was unremarkable.  He was discharged home.  He was admitted 7/15 - 07/02/22 for shortness of breath left lower extremity edema, elevated blood pressure.  Chest x-ray showed vascular congestion with interstitial edema.  Troponins were mildly elevated.  Home BP meds were restarted and he was diuresed with IV Lasix.  Myoview showed low risk with no ischemia.  Nephrology decided to initiate dialysis during hospitalization so permacath was placed.  He was sent home on torsemide 40 mg for nondialysis days.  Again hospitalized  from 9/28 - 09/09/2022 after presenting to the emergency department following accidental fall in which she tripped on the sidewalk twisting his right leg and hitting his left shoulder.  He denied any head injury or loss of consciousness.  He was found to be hypoxic with increased oxygen requirement.  He was confused and desaturated to 71%.  COVID was negative.  He was evaluated by the trauma team and all imaging was negative.  CT of the chest was ordered to rule out PE but was unable to be obtained without nephrology consultation.  Urine drug screen was positive for cocaine and amphetamines.  VQ scan completed with low probability of PE.  Nephrology was consulted for continuation of hemodialysis.  He continued on hemodialysis, mental status was back to baseline nephrology signed off recommended patient continue hemodialysis as outpatient.  Patient was canceled to refrain from drug use like cocaine and marijuana was subsequently discharged home.  Admitted to [facility] again 12/14 - 11/24/2022 who presented for shortness of breath for 2 to 3 days with an ambulatory dysfunction.  He felt short winded so he did not go to his dialysis sessions.  In the emergency department he was noted to be tachypneic and was on 2 L of oxygen via nasal cannula.  Troponin was 149 and BNP was elevated at 625.  COVID, influenza A and influenza B/RSV PCR were negative.  Nephrology was consulted from the emergency department he was given aspirin and Lasix as well as DuoNeb's and was admitted to the hospital for further evaluation and treatment.  He underwent dialysis for volume removal during his hospitalization and  was discharged.  Admitted again from 3/29 - 02/28/2023 who presented to the emergency department from dialysis with shortness of breath that developed towards the end of his dialysis session.  Stated he had missed a couple of dialysis sessions a week prior due to being ill with vomiting and diarrhea and generalized weakness.  EMS  reported O2 sats of 86% and he was placed on 6 L for transport.  Blood pressure was suboptimally controlled.  Troponin of 15 and BNP 195.  Chest x-ray showed diffusely increased interstitial and hazy pulmonary opacities suspect for edema and tiny right pleural effusion.  Nephrology was consulted and he was taken for urgent dialysis shortly after admission.  Oxygen was weaned after several treatments he no longer experienced any GI symptoms and was able to be discharged home.  Admitted again 7/18 - 06/29/2023 for continued shortness of breath and missed dialysis sessions.  Stating when he arrived on the 18th he had not been to dialysis since the night.  So 10 missed days of dialysis they had difficulty accessing his left arm fistula.  No significant volume overload or other complication of missed dialysis noted.  Vascular surgery placed a right IJ tunneled dialysis catheter on 7/19 the patient received hemodialysis on the 19th and the 20th the catheter was functional.  Nephrology advised resuming dialysis and he was stable for discharge.  Admitted again 10/7 - 09/18/2023 after missing 7 consecutive dialysis sessions and ran out of his blood pressure medicine and he become increasingly short of breath with pressure-like chest discomfort.  He was treated with dialysis for fluid removal resumption of medications and discharge.  He presented again to the [facility] emergency department on 09/27/2023 reporting that he had short of breath and was feeling off for few weeks.  States that he had been to dialysis in 1 week.  He reports he had been weak and seeing numbers and was supposed to wear oxygen but reports that he ran out of O2.  He denied any other associated symptoms of chest pain headache.  Stated that he was still able to make urine.  He was somnolent and required redirecting for further history.  Blood pressure was found to be 242/116 with a pulse of 96, respirations of 26, temperature of 98, and oxygen saturation of  84%.  Pertinent labs revealed a blood glucose 138, BUN 96, serum creatinine 6.93, calcium 8.1, and GFR of 9, hemoglobin 10, BNP of 573.4, and high-sensitivity troponin of 553.  He was given IV Lasix since he still made urine, heparin bolus and drip, DuoNeb, Solu-Medrol, 1 inch of Nitropaste followed by an IV nitroglycerin drip.  Discussed admission and call to nephrology for urgent dialysis.  Cardiology was consulted this morning for concern of NSTEMI with elevated high-sensitivity troponin, and hypertensive urgency.   Past Medical History:  Diagnosis Date   Abdominal abscess    a.) chronic MRSA infection   Acute pancreatitis    Anxiety    Asthma    Brain aneurysm    a.) 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 b.) RIGHT crainotomy for clip ligation 05/14/2019.   CHF (congestive heart failure) (HCC)    Chronic back pain    Chronic heart failure with preserved ejection fraction (HFpEF) (HCC)    a. 11/2021 Echo: EF 55-60%, no rwma, mod LVH, GrI DD. Nl RV size/fxn. Mildly dil LA.   CKD (chronic kidney disease), stage V (HCC)    Depression  Emphysema of lung (HCC)    Erectile dysfunction    Followed by palliative Cooley service    GERD (gastroesophageal reflux disease)    History of methicillin resistant staphylococcus aureus (MRSA)    HLD (hyperlipidemia)    Hypertension    Mild cognitive impairment    MRSA (methicillin resistant Staphylococcus aureus)    Obesity    OSA (obstructive sleep apnea) 2013   a.) not compliant with nocturnal PAP therapy; CPAP machine "lost or stolen".   Panic disorder    Perforated bowel (HCC) 12/10/2005   Tempoary Colostomy Bag, Skin Graft for Abd wound   Polysubstance abuse (HCC)    a.) ETOH, tobacco, marijuana, methamphetamines, cocaine, BZO, opioids.   Subarachnoid hemorrhage (HCC) 01/26/2010   a.) spontaneous rupture --> SAH from RIGHT PComm --> coil embolization 01/26/2010 with  known remaining neck remnant.   T2DM (type 2 diabetes mellitus) (HCC)    Tobacco abuse    Type 2 diabetes mellitus without complication, without long-term current use of insulin (HCC) 04/17/2016   Vitreous hemorrhage (HCC) 03/27/2011   Overview:  Bilateral; 01/2010, from [facility]     Past Surgical History:  Procedure Laterality Date   A/V FISTULAGRAM Left 07/16/2022   Procedure: A/V Fistulagram;  Surgeon: Annice Needy, MD;  Location: [facility] INVASIVE CV LAB;  Service: Cardiovascular;  Laterality: Left;   AV FISTULA PLACEMENT Left 05/24/2022   Procedure: ARTERIOVENOUS (AV) FISTULA CREATION ( RADIAL CEPHALIC);  Surgeon: Annice Needy, MD;  Location: [facility] ORS;  Service: Vascular;  Laterality: Left;   CEREBRAL ANEURYSM REPAIR Right 01/26/2010   Procedure: CEREBRAL ANEURYSM REPAIR (COIL EMBOLIZATION)   CEREBRAL ANEURYSM REPAIR Right 05/14/2019   Procedure: CRAINOTOMY FOR CEREBRAL ANEURYSM REPAIR (CLIP LIGATION)   COLON SURGERY  12/10/2005   colostomy bag placed s/p perforated bowel   COLONOSCOPY WITH PROPOFOL N/A 05/18/2020   Procedure: COLONOSCOPY WITH PROPOFOL;  Surgeon: Toney Reil, MD;  Location: [facility] ENDOSCOPY;  Service: Gastroenterology;  Laterality: N/A;   DIALYSIS/PERMA CATHETER INSERTION N/A 06/27/2022   Procedure: DIALYSIS/PERMA CATHETER INSERTION;  Surgeon: Renford Dills, MD;  Location: [facility] INVASIVE CV LAB;  Service: Cardiovascular;  Laterality: N/A;   DIALYSIS/PERMA CATHETER INSERTION N/A 06/28/2023   Procedure: DIALYSIS/PERMA CATHETER INSERTION;  Surgeon: Renford Dills, MD;  Location: [facility] INVASIVE CV LAB;  Service: Cardiovascular;  Laterality: N/A;   DIALYSIS/PERMA CATHETER REMOVAL N/A 05/20/2023   Procedure: DIALYSIS/PERMA CATHETER REMOVAL;  Surgeon: Annice Needy, MD;  Location: [facility] INVASIVE CV LAB;  Service: Cardiovascular;  Laterality: N/A;   DIALYSIS/PERMA CATHETER REMOVAL N/A 09/09/2023   Procedure: DIALYSIS/PERMA CATHETER REMOVAL;  Surgeon: Annice Needy, MD;  Location:  [facility] INVASIVE CV LAB;  Service: Cardiovascular;  Laterality: N/A;   KNEE SURGERY Right    Perforated bowel       Home Medications:  Prior to Admission medications   Medication Sig Start Date End Date Taking? Authorizing Provider  albuterol (VENTOLIN HFA) 108 (90 Base) MCG/ACT inhaler Inhale 1-2 puffs into the lungs every 6 (six) hours as needed for wheezing or shortness of breath. 09/18/23  Yes Loyce Dys, MD  amLODipine (NORVASC) 10 MG tablet Take 10 mg by mouth daily.   Yes [provider]  DULoxetine (CYMBALTA) 60 MG capsule Take 1 capsule (60 mg total) by mouth daily. 11/24/22 09/28/23 Yes Pokhrel, Laxman, MD  lidocaine-prilocaine (EMLA) cream Apply 1 Application topically daily. 08/23/22  Yes [provider]  losartan (COZAAR) 100 MG tablet Take 100 mg by mouth daily.   Yes  [provider]  pantoprazole (PROTONIX) 40 MG tablet TAKE 1 TABLET(40 MG) BY MOUTH TWICE DAILY AS NEEDED 01/23/23  Yes Derek Grooms, NP  topiramate (TOPAMAX) 50 MG tablet TAKE 1 TABLET(50 MG) BY MOUTH TWICE DAILY 01/07/23  Yes Derek Grooms, NP  TRELEGY ELLIPTA 100-62.5-25 MCG/ACT AEPB Inhale 1 puff into the lungs daily. 06/10/23  Yes Mecum, Erin E, PA-C  glucose blood (ONETOUCH ULTRA) test strip USE TO TEST BLOOD SUGAR DAILY Patient not taking: Reported on 09/16/2023 08/23/20   Valentino Nose, NP  OXYGEN Inhale 3 L into the lungs daily.    [provider]    Inpatient Medications: Scheduled Meds:  amLODipine  10 mg Oral Daily   [START ON 09/29/2023] aspirin EC  81 mg Oral Daily   DULoxetine  60 mg Oral Daily   insulin aspart  0-15 Units Subcutaneous TID WC   insulin aspart  0-5 Units Subcutaneous QHS   losartan  100 mg Oral Daily   pantoprazole  40 mg Oral Daily   topiramate  50 mg Oral BID   Continuous Infusions:  heparin 1,150 Units/hr (09/28/23 0034)   nitroGLYCERIN 200 mcg/min (09/28/23 0630)   PRN Meds: acetaminophen, albuterol, ALPRAZolam, magnesium  hydroxide, nitroGLYCERIN, ondansetron (ZOFRAN) IV, traZODone  Allergies:    Allergies  Allergen Reactions   Atorvastatin     Joint Aches - Severe Joint Aches - Severe Joint Aches - Severe   Avelox [Moxifloxacin Hcl In Nacl]     Muscle pain   Buprenorphine     Mouth sores, confusion, shaking   Dilaudid [Hydromorphone Hcl] Hives   Fluoxetine Itching   Levofloxacin Other (See Comments)    Joint Pain   Morphine And Codeine    Other     Muscle pain   Suboxone [Buprenorphine Hcl-Naloxone Hcl] Other (See Comments)    Rash and confused   Vancomycin     Renal insufficiency    Social History:   Social History   Socioeconomic History   Marital status: Divorced    Spouse name: Not on file   Number of children: Not on file   Years of education: Not on file   Highest education level: Not on file  Occupational History   Not on file  Tobacco Use   Smoking status: Every Day    Current packs/day: 1.00    Average packs/day: 1 pack/day for 40.0 years (40.0 ttl pk-yrs)    Types: Cigarettes    Start date: 10/11/1984   Smokeless tobacco: Never  Vaping Use   Vaping status: Never Used  Substance and Sexual Activity   Alcohol use: Yes   Drug use: Not Currently    Types: "Crack" cocaine, Amphetamines, Methamphetamines, Benzodiazepines, Cocaine, Marijuana, Other-see comments    Comment: opioids   Sexual activity: Not Currently  Other Topics Concern   Not on file  Social History Narrative   Lives alone and has nursing assistance help that lives in.   Social Determinants of Health   Financial Resource Strain: Low Risk  (06/25/2023)   Overall Financial Resource Strain (CARDIA)    Difficulty of Paying Living Expenses: Not hard at all  Food Insecurity: Food Insecurity Present (06/27/2023)   Hunger Vital Sign    Worried About Running Out of Food in the Last Year: Sometimes true    Ran Out of Food in the Last Year: Sometimes true  Transportation Needs: No Transportation Needs (06/27/2023)    PRAPARE - Administrator, Civil Service (Medical): No  Lack of Transportation (Non-Medical): No  Physical Activity: Inactive (06/25/2023)   Exercise Vital Sign    Days of Exercise per Week: 0 days    Minutes of Exercise per Session: 0 min  Stress: No Stress Concern Present (06/25/2023)   [employer] of Occupational Health - Occupational Stress Questionnaire    Feeling of Stress : Only a little  Social Connections: Socially Isolated (06/25/2023)   Social Connection and Isolation Panel [NHANES]    Frequency of Communication with Friends and Family: Never    Frequency of Social Gatherings with Friends and Family: Never    Attends Religious Services: Never    Database administrator or Organizations: No    Attends Banker Meetings: Never    Marital Status: Divorced  Catering manager Violence: Not At Risk (06/27/2023)   Humiliation, Afraid, Rape, and Kick questionnaire    Fear of Current or Ex-Partner: No    Emotionally Abused: No    Physically Abused: No    Sexually Abused: No    Family History:    Family History  Problem Relation Age of Onset   Arthritis Mother    Asthma Mother    Diabetes Mother    Heart disease Mother    Hyperlipidemia Mother    Hypertension Mother    Kidney disease Mother    Thyroid disease Mother    Lung disease Mother    Anxiety disorder Mother    Depression Mother    Diabetes Father    Heart disease Father    Depression Father    Anxiety disorder Father    Arthritis Sister    Asthma Sister    Hyperlipidemia Sister    Hypertension Sister    Lung disease Sister    Anxiety disorder Sister    Depression Sister    Hyperlipidemia Brother    Hypertension Brother    Diabetes Sister    Heart disease Sister    Depression Sister    Anxiety disorder Sister    Anxiety disorder Brother    Depression Brother    Heart disease Brother      ROS:  Please see the history of present illness.  Review of Systems   Constitutional:  Positive for malaise/fatigue.  Respiratory:  Positive for shortness of breath.   Cardiovascular:  Positive for leg swelling.  Musculoskeletal:  Positive for falls.  Neurological:  Positive for weakness.  Psychiatric/Behavioral:  Positive for memory loss.     All other ROS reviewed and negative.     Physical Exam/Data:   Vitals:   09/28/23 0545 09/28/23 0550 09/28/23 0555 09/28/23 0600  BP: (!) 209/92 (!) 225/98 (!) 221/108 (!) 214/100  Pulse: 74 80 77 88  Resp:      Temp:      TempSrc:      SpO2: 100% 100% 100% 100%   No intake or output data in the 24 hours ending 09/28/23 0722    09/18/2023    8:44 AM 09/17/2023    8:10 PM 09/17/2023    3:13 PM  Last 3 Weights  Weight (lbs) 207 lb 12.8 oz 209 lb 7 oz 214 lb 1.1 oz  Weight (kg) 94.257 kg 95 kg 97.1 kg     There is no height or weight on file to calculate BMI.  General:  Well nourished, well developed, in no acute distress HEENT: normal Neck: no JVD Vascular: No carotid bruits; Distal pulses 2+ bilaterally Cardiac:  normal S1, S2, ; RRR; no murmur  Lungs:  diminished with expiratory wheezing noted throughout to auscultation bilaterally,respirations are unlabored at rest on  Abd: soft, nontender, obese, no hepatomegaly  Ext: 1+ edema Musculoskeletal:  No deformities, BUE and BLE strength normal and equal Skin: warm and dry  Neuro:  CNs 2-12 intact, no focal abnormalities noted Psych:  Normal affect   EKG:  The EKG was personally reviewed and demonstrates: Sinus rhythm with a rate of 92, left anterior fascicular block, specific ST and T wave changes Telemetry:  Telemetry was personally reviewed and demonstrates:  sinus rates in the 90's  Relevant CV Studies: TTE 06/23/2022: 1. Left ventricular ejection fraction, by estimation, is 60 to 65%. The  left ventricle has normal function. The left ventricle has no regional  wall motion abnormalities. There is mild left ventricular hypertrophy.  Left  ventricular diastolic parameters  were normal.   2. Right ventricular systolic function is normal. The right ventricular  size is normal.   3. The mitral valve is normal in structure. No evidence of mitral valve  regurgitation.   4. The aortic valve is tricuspid. Aortic valve regurgitation is not  visualized. Aortic valve sclerosis is present, with no evidence of aortic  valve stenosis.    Myoview Stress Test 06/26/22:   There is no evidence of significant ischemia or scar, though evaluation of the inferior wall is quite limited due to extracardiac activity and diaphragmatic attenuation.   Left ventricular systolic function is normal by visual estimation.  Calculated LVEF or 40-45% is likely artifactually low due to significant extracardiac activity.   Mild coronary artery calcification is noted on the attenuation correction CT.   Faint hazy opacity is noted in the right upper lobe adjacent to the major fissure of uncertain clinical significance.  Dedicated chest CT could be obtained for further evaluation, as clinically indicated.  Laboratory Data:  High Sensitivity Troponin:   Recent Labs  Lab 09/16/23 0530 09/16/23 0715 09/27/23 2251  TROPONINIHS 121* 107* 553*     Chemistry Recent Labs  Lab 09/27/23 2036  NA 142  K 4.0  CL 106  CO2 24  GLUCOSE 138*  BUN 96*  CREATININE 6.93*  CALCIUM 8.1*  GFRNONAA 9*  ANIONGAP 12    Recent Labs  Lab 09/27/23 2251  PROT 6.8  ALBUMIN 3.6  AST 19  ALT 23  ALKPHOS 82  BILITOT 1.0   Lipids No results for input(s): "CHOL", "TRIG", "HDL", "LABVLDL", "LDLCALC", "CHOLHDL" in the last 168 hours.  Hematology Recent Labs  Lab 09/27/23 2036  WBC 8.3  RBC 3.08*  HGB 10.0*  HCT 30.6*  MCV 99.4  MCH 32.5  MCHC 32.7  RDW 14.0  PLT 201   Thyroid No results for input(s): "TSH", "FREET4" in the last 168 hours.  BNP Recent Labs  Lab 09/27/23 2034  BNP 573.4*    DDimer No results for input(s): "DDIMER" in the last 168  hours.   Radiology/Studies:  CT Head Wo Contrast  Result Date: 09/28/2023 CLINICAL DATA:  Mental status change, unknown cause Pt reports SHOB and feeling "off" for a few weeks. Pt reports he hasn't been to dialysis in 1 week. Pt reports he has been weak and "seeing numbers" Pt supposed to wear O2 but reports he ran out. EXAM: CT HEAD WITHOUT CONTRAST TECHNIQUE: Contiguous axial images were obtained from the base of the skull through the vertex without intravenous contrast. RADIATION DOSE REDUCTION: This exam was performed according to the departmental dose-optimization program which includes automated exposure control, adjustment of the  mA and/or kV according to patient size and/or use of iterative reconstruction technique. COMPARISON:  CT head 09/16/2023 FINDINGS: Brain: No evidence of large-territorial acute infarction. No parenchymal hemorrhage. No mass lesion. No extra-axial collection. No mass effect or midline shift. No hydrocephalus. Basilar cisterns are patent. Vascular: No hyperdense vessel. 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 Skull: No acute fracture or focal lesion.  Prior right craniotomy. Sinuses/Orbits: Bilateral maxillary sinus mucosal thickening. Otherwise paranasal sinuses and mastoid air cells are clear. The orbits are unremarkable. Other: None. IMPRESSION: No acute intracranial abnormality. Electronically Signed   By: Tish Frederickson M.D.   On: 09/28/2023 00:27   DG Chest Port 1 View  Result Date: 09/27/2023 CLINICAL DATA:  Shortness of breath EXAM: PORTABLE CHEST 1 VIEW COMPARISON:  09/16/2023 FINDINGS: Cardiac shadow is enlarged but stable. The lungs are well aerated bilaterally. No focal infiltrate or effusion is seen. No bony abnormality is noted. IMPRESSION: No acute abnormality noted. Electronically Signed   By: Alcide Clever M.D.   On: 09/27/2023 21:19     Assessment and Plan:   Elevated high-sensitivity troponin -Patient arrived with  shortness of breath -Denies chest pain -High-sensitivity troponin 553, continue to trend likely demand ischemia from hypertensive urgency, volume overload from missed dialysis -No ischemic EKG changes noted -History of drug use UDS ordered -Currently on IV heparin infusion  -No current plans for further ischemic evaluation as patient remains pain-free -Echocardiogram ordered and pending to assess for wall motion abnormalities. -Continue with telemetry monitoring -EKG as needed for pain or changes -Continued on aspirin  Chronic HFpEF -Presented with progressive shortness of breath after missing 7 dialysis treatments -BNP 573.4 -Fluid removal per dialysis -Given furosemide 60 mg IVP in the emergency department -No recorded I's or O's -Not a candidate for SGLT2 inhibitors due to dialysis or MRAs -Continue with heart failure education -Echocardiogram ordered and pending with further recommendations to follow -Daily weights, I's and O's, low-sodium diet  Hypertensive urgency -Blood pressure 214/100 -Currently on losartan 100 mg daily, amlodipine 10 mg daily -On nitroglycerin drip -Awaiting dialysis this morning -Vital signs per unit protocol  End-stage renal disease on hemodialysis -Patient states he has missed the last 7 sessions -Typically Tuesday Thursday Saturday -Dialysis to be managed by nephrology  Hyperlipidemia -History of statin intolerance -Previously had been on ezetimibe and fenofibrate, commend restarting PTA medications -Lipid panel pending  Type 2 diabetes -Continued on sliding scale -Continue management per IM  COPD with continued tobacco abuse -Maintaining oxygen saturations on 4 L of O2 via nasal cannula -Often arrives hypoxic requiring increased oxygen -Titrate FiO2 to maintain oxygen saturations greater than equal to 92% -Recommend restarting PTA inhalers -Smoking cessation is recommended  Long history of noncompliance -Long history of noncompliance  with medications and dialysis -Stressed importance of medications and dialysis to prevent continued repeat hospitalizations  Risk Assessment/Risk Scores:     TIMI Risk Score for Unstable Angina or Non-ST Elevation MI:   The patient's TIMI risk score is 3, which indicates a 13% risk of all cause mortality, new or recurrent myocardial infarction or need for urgent revascularization in the next 14 days.  New York Heart Association (NYHA) Functional Class NYHA Class II        For questions or updates, please contact [facility] Please consult [email or web address] for contact info under    Signed, Tory Septer, NP  09/28/2023 7:22 AM

## 2023-09-28 NOTE — Progress Notes (Signed)
Referring Provider: No ref. provider found Primary Care Physician:  Larae Grooms, NP Primary Nephrologist:  Dr.   Jaquita Rector for Consultation: ESRD  HPI: 57 year old male with history of hypertension, coronary artery disease, congestive heart failure, hyperlipidemia, peripheral vascular disease, diabetes, GERD, asthma, end-stage renal disease on dialysis was admitted through the emergency room with onset of dry cough dyspnea associated with wheezing.  He had been on dialysis and has been noncompliant with his dialysis treatments.  He missed 3 treatments as per the family.  He was given Lasix 60 mg IV push in the emergency room.  In the emergency room he was found to have a systolic blood pressure over 200 and a diastolic of over 782.  As per the ED note he had an NSTEMI.  He denies any chest pain, nausea vomiting or abdominal pain.  He had a chest x-ray in the emergency room which was negative.  Past Medical History:  Diagnosis Date   Abdominal abscess    a.) chronic MRSA infection   Acute pancreatitis    Anxiety    Asthma    Brain aneurysm    a.) 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 b.) RIGHT crainotomy for clip ligation 05/14/2019.   CHF (congestive heart failure) (HCC)    Chronic back pain    Chronic heart failure with preserved ejection fraction (HFpEF) (HCC)    a. 11/2021 Echo: EF 55-60%, no rwma, mod LVH, GrI DD. Nl RV size/fxn. Mildly dil LA.   CKD (chronic kidney disease), stage V (HCC)    Depression    Emphysema of lung (HCC)    Erectile dysfunction    Followed by palliative care service    GERD (gastroesophageal reflux disease)    History of methicillin resistant staphylococcus aureus (MRSA)    HLD (hyperlipidemia)    Hypertension    Mild cognitive impairment    MRSA (methicillin resistant Staphylococcus aureus)    Obesity    OSA (obstructive sleep apnea) 2013   a.) not compliant with nocturnal PAP  therapy; CPAP machine "lost or stolen".   Panic disorder    Perforated bowel (HCC) 12/10/2005   Tempoary Colostomy Bag, Skin Graft for Abd wound   Polysubstance abuse (HCC)    a.) ETOH, tobacco, marijuana, methamphetamines, cocaine, BZO, opioids.   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   T2DM (type 2 diabetes mellitus) (HCC)    Tobacco abuse    Type 2 diabetes mellitus without complication, without long-term current use of insulin (HCC) 04/17/2016   Vitreous hemorrhage (HCC) 03/27/2011   Overview:  Bilateral; [facility]/2010, [facility]     Past Surgical History:  Procedure Laterality Date   A/V FISTULAGRAM Left 07/16/2022   Procedure: A/V Fistulagram;  Surgeon: Annice Needy, MD;  Location: [facility] INVASIVE CV LAB;  Service: Cardiovascular;  Laterality: Left;   AV FISTULA PLACEMENT Left 05/24/2022   Procedure: ARTERIOVENOUS (AV) FISTULA CREATION ( RADIAL CEPHALIC);  Surgeon: Annice Needy, MD;  Location: [facility] ORS;  Service: Vascular;  Laterality: Left;   CEREBRAL ANEURYSM REPAIR Right 01/26/2010   Procedure: CEREBRAL ANEURYSM REPAIR (COIL EMBOLIZATION)   CEREBRAL ANEURYSM REPAIR Right 05/14/2019   Procedure: CRAINOTOMY FOR CEREBRAL ANEURYSM REPAIR (CLIP LIGATION)   COLON SURGERY  12/10/2005   colostomy bag placed s/p perforated bowel   COLONOSCOPY WITH PROPOFOL N/A 05/18/2020   Procedure: COLONOSCOPY WITH  PROPOFOL;  Surgeon: Toney Reil, MD;  Location: [facility] ENDOSCOPY;  Service: Gastroenterology;  Laterality: N/A;   DIALYSIS/PERMA CATHETER INSERTION N/A 06/27/2022   Procedure: DIALYSIS/PERMA CATHETER INSERTION;  Surgeon: Renford Dills, MD;  Location: [facility] INVASIVE CV LAB;  Service: Cardiovascular;  Laterality: N/A;   DIALYSIS/PERMA CATHETER INSERTION N/A 06/28/2023   Procedure: DIALYSIS/PERMA CATHETER INSERTION;  Surgeon: Renford Dills, MD;  Location: [facility] INVASIVE CV  LAB;  Service: Cardiovascular;  Laterality: N/A;   DIALYSIS/PERMA CATHETER REMOVAL N/A 05/20/2023   Procedure: DIALYSIS/PERMA CATHETER REMOVAL;  Surgeon: Annice Needy, MD;  Location: [facility] INVASIVE CV LAB;  Service: Cardiovascular;  Laterality: N/A;   DIALYSIS/PERMA CATHETER REMOVAL N/A 09/09/2023   Procedure: DIALYSIS/PERMA CATHETER REMOVAL;  Surgeon: Annice Needy, MD;  Location: [facility] INVASIVE CV LAB;  Service: Cardiovascular;  Laterality: N/A;   KNEE SURGERY Right    Perforated bowel      Prior to Admission medications   Medication Sig Start Date End Date Taking? Authorizing Provider  albuterol (VENTOLIN HFA) 108 (90 Base) MCG/ACT inhaler Inhale 1-2 puffs into the lungs every 6 (six) hours as needed for wheezing or shortness of breath. 09/18/23  Yes Loyce Dys, MD  amLODipine (NORVASC) 10 MG tablet Take 10 mg by mouth daily.   Yes [provider]  DULoxetine (CYMBALTA) 60 MG capsule Take 1 capsule (60 mg total) by mouth daily. 11/24/22 09/28/23 Yes Pokhrel, Laxman, MD  lidocaine-prilocaine (EMLA) cream Apply 1 Application topically daily. 08/23/22  Yes [provider]  losartan (COZAAR) 100 MG tablet Take 100 mg by mouth daily.   Yes [provider]  pantoprazole (PROTONIX) 40 MG tablet TAKE 1 TABLET(40 MG) BY MOUTH TWICE DAILY AS NEEDED 01/23/23  Yes Larae Grooms, NP  topiramate (TOPAMAX) 50 MG tablet TAKE 1 TABLET(50 MG) BY MOUTH TWICE DAILY 01/07/23  Yes Larae Grooms, NP  TRELEGY ELLIPTA 100-62.5-25 MCG/ACT AEPB Inhale 1 puff into the lungs daily. 06/10/23  Yes Mecum, Erin E, PA-C  glucose blood (ONETOUCH ULTRA) test strip USE TO TEST BLOOD SUGAR DAILY Patient not taking: Reported on 09/16/2023 08/23/20   Valentino Nose, NP  OXYGEN Inhale 3 L into the lungs daily.    [provider]    Current Facility-Administered Medications  Medication Dose Route Frequency Provider Last Rate Last Admin   acetaminophen (TYLENOL) tablet 650 mg  650 mg Oral  Q4H PRN Mansy, Jan A, MD       albuterol (PROVENTIL) (2.5 MG/3ML) 0.083% nebulizer solution 2.5 mg  2.5 mg Inhalation Q2H PRN Mumma, Carollee Herter, MD   2.5 mg at 09/27/23 2212   ALPRAZolam (XANAX) tablet 0.25 mg  0.25 mg Oral BID PRN Mansy, Jan A, MD       alteplase (CATHFLO ACTIVASE) injection 2 mg  2 mg Intracatheter Once PRN Lorain Childes, MD       amLODipine (NORVASC) tablet 10 mg  10 mg Oral Daily Mansy, Jan A, MD   10 mg at 09/28/23 1035   anticoagulant sodium citrate solution 5 mL  5 mL Intracatheter PRN Lorain Childes, MD       [START ON 09/29/2023] aspirin EC tablet 81 mg  81 mg Oral Daily Mansy, Jan A, MD       Chlorhexidine Gluconate Cloth 2 % PADS 6 each  6 each Topical Q0600 Sreenath, Sudheer B, MD       DULoxetine (CYMBALTA) DR capsule 60 mg  60 mg Oral Daily Mansy, Jan A, MD   60 mg at 09/28/23  1035   heparin ADULT infusion 100 units/mL (25000 units/269mL)  1,150 Units/hr Intravenous Continuous Tressie Ellis, RPH 11.5 mL/hr at 09/28/23 0034 1,150 Units/hr at 09/28/23 0034   heparin bolus via infusion 2,500 Units  2,500 Units Intravenous Once Tressie Ellis, RPH       heparin injection 1,000 Units  1,000 Units Intracatheter PRN Lorain Childes, MD       insulin aspart (novoLOG) injection 0-15 Units  0-15 Units Subcutaneous TID WC Mansy, Jan A, MD       insulin aspart (novoLOG) injection 0-5 Units  0-5 Units Subcutaneous QHS Mansy, Jan A, MD       lidocaine (PF) (XYLOCAINE) 1 % injection 5 mL  5 mL Intradermal PRN Lorain Childes, MD       lidocaine-prilocaine (EMLA) cream 1 Application  1 Application Topical PRN Lorain Childes, MD       losartan (COZAAR) tablet 100 mg  100 mg Oral Daily Mansy, Jan A, MD   100 mg at 09/28/23 1035   magnesium hydroxide (MILK OF MAGNESIA) suspension 30 mL  30 mL Oral Daily PRN Mansy, Jan A, MD       nitroGLYCERIN (NITROSTAT) SL tablet 0.4 mg  0.4 mg Sublingual Q5 Min x 3 PRN Mansy, Jan A, MD       nitroGLYCERIN 50 mg in dextrose 5 % 250 mL (0.2  mg/mL) infusion  0-200 mcg/min Intravenous Continuous Mumma, Shannon, MD 60 mL/hr at 09/28/23 0630 200 mcg/min at 09/28/23 0630   ondansetron (ZOFRAN) injection 4 mg  4 mg Intravenous Q6H PRN Mansy, Jan A, MD       pantoprazole (PROTONIX) EC tablet 40 mg  40 mg Oral Daily Mansy, Jan A, MD   40 mg at 09/28/23 1035   pentafluoroprop-tetrafluoroeth (GEBAUERS) aerosol 1 Application  1 Application Topical PRN Lorain Childes, MD       topiramate (TOPAMAX) tablet 50 mg  50 mg Oral BID Mansy, Jan A, MD       traZODone (DESYREL) tablet 25 mg  25 mg Oral QHS PRN Mansy, Jan A, MD        Allergies as of 09/27/2023 - Review Complete 09/27/2023  Allergen Reaction Noted   Atorvastatin  07/18/2016   Avelox [moxifloxacin hcl in nacl]  05/03/2015   Buprenorphine  01/19/2020   Dilaudid [hydromorphone hcl] Hives 05/03/2015   Fluoxetine Itching 07/10/2015   Levofloxacin Other (See Comments) 07/10/2015   Morphine and codeine  06/29/2019   Other  05/03/2015   Suboxone [buprenorphine hcl-naloxone hcl] Other (See Comments) 03/23/2020   Vancomycin  05/03/2015    Family History  Problem Relation Age of Onset   Arthritis Mother    Asthma Mother    Diabetes Mother    Heart disease Mother    Hyperlipidemia Mother    Hypertension Mother    Kidney disease Mother    Thyroid disease Mother    Lung disease Mother    Anxiety disorder Mother    Depression Mother    Diabetes Father    Heart disease Father    Depression Father    Anxiety disorder Father    Arthritis Sister    Asthma Sister    Hyperlipidemia Sister    Hypertension Sister    Lung disease Sister    Anxiety disorder Sister    Depression Sister    Hyperlipidemia Brother    Hypertension Brother    Diabetes Sister    Heart disease Sister    Depression Sister  Anxiety disorder Sister    Anxiety disorder Brother    Depression Brother    Heart disease Brother     Social History   Socioeconomic History   Marital status: Divorced     Spouse name: Not on file   Number of children: Not on file   Years of education: Not on file   Highest education level: Not on file  Occupational History   Not on file  Tobacco Use   Smoking status: Every Day    Current packs/day: 1.00    Average packs/day: 1 pack/day for 40.0 years (40.0 ttl pk-yrs)    Types: Cigarettes    Start date: 10/11/1984   Smokeless tobacco: Never  Vaping Use   Vaping status: Never Used  Substance and Sexual Activity   Alcohol use: Yes   Drug use: Not Currently    Types: "Crack" cocaine, Amphetamines, Methamphetamines, Benzodiazepines, Cocaine, Marijuana, Other-see comments    Comment: opioids   Sexual activity: Not Currently  Other Topics Concern   Not on file  Social History Narrative   Lives alone and has nursing assistance help that lives in.   Social Determinants of Health   Financial Resource Strain: Low Risk  (06/25/2023)   Overall Financial Resource Strain (CARDIA)    Difficulty of Paying Living Expenses: Not hard at all  Food Insecurity: Food Insecurity Present (06/27/2023)   Hunger Vital Sign    Worried About Running Out of Food in the Last Year: Sometimes true    Ran Out of Food in the Last Year: Sometimes true  Transportation Needs: No Transportation Needs (06/27/2023)   PRAPARE - Administrator, Civil Service (Medical): No    Lack of Transportation (Non-Medical): No  Physical Activity: Inactive (06/25/2023)   Exercise Vital Sign    Days of Exercise per Week: 0 days    Minutes of Exercise per Session: 0 min  Stress: No Stress Concern Present (06/25/2023)   [employer] of Occupational Health - Occupational Stress Questionnaire    Feeling of Stress : Only a little  Social Connections: Socially Isolated (06/25/2023)   Social Connection and Isolation Panel [NHANES]    Frequency of Communication with Friends and Family: Never    Frequency of Social Gatherings with Friends and Family: Never    Attends Religious Services:  Never    Database administrator or Organizations: No    Attends Banker Meetings: Never    Marital Status: Divorced  Catering manager Violence: Not At Risk (06/27/2023)   Humiliation, Afraid, Rape, and Kick questionnaire    Fear of Current or Ex-Partner: No    Emotionally Abused: No    Physically Abused: No    Sexually Abused: No    Physical Exam: Vital signs in last 24 hours: Temp:  [98 F (36.7 C)-98.2 F (36.8 C)] 98.1 F (36.7 C) (10/19 0844) Pulse Rate:  [62-109] 91 (10/19 1200) Resp:  [12-28] 23 (10/19 1200) BP: (133-263)/(67-164) 146/77 (10/19 1200) SpO2:  [84 %-100 %] 94 % (10/19 1200) Weight:  [96.8 kg] 96.8 kg (10/19 0844) Last BM Date : 09/28/23 General:   Alert,  Well-developed, well-nourished, pleasant and cooperative in NAD Head:  Normocephalic and atraumatic. Eyes:  Sclera clear, no icterus.   Conjunctiva pink. Ears:  Normal auditory acuity. Nose:  No deformity, discharge,  or lesions. Lungs:  Clear throughout to auscultation.   No wheezes, crackles, or rhonchi. No acute distress. Heart:  Regular rate and rhythm; no murmurs, clicks, rubs,  or gallops. Abdomen:  Soft, nontender and nondistended. No masses, hepatosplenomegaly or hernias noted. Normal bowel sounds, without guarding, and without rebound.   Extremities:  Without clubbing or edema.  Intake/Output from previous day: No intake/output data recorded. Intake/Output this shift: No intake/output data recorded.  Lab Results: Recent Labs    09/27/23 2036 09/28/23 0725  WBC 8.3 8.4  HGB 10.0* 9.5*  HCT 30.6* 29.2*  PLT 201 205   BMET Recent Labs    09/27/23 2036 09/28/23 0725  NA 142 139  K 4.0 4.4  CL 106 104  CO2 24 22  GLUCOSE 138* 198*  BUN 96* 95*  CREATININE 6.93* 7.03*  CALCIUM 8.1* 8.0*   LFT Recent Labs    09/27/23 2251  PROT 6.8  ALBUMIN 3.6  AST 19  ALT 23  ALKPHOS 82  BILITOT 1.0  BILIDIR 0.1  IBILI 0.9   PT/INR Recent Labs    09/28/23 0045   LABPROT 15.5*  INR 1.2   Hepatitis Panel No results for input(s): "HEPBSAG", "HCVAB", "HEPAIGM", "HEPBIGM" in the last 72 hours.  Studies/Results: CT Head Wo Contrast  Result Date: 09/28/2023 CLINICAL DATA:  Mental status change, unknown cause Pt reports SHOB and feeling "off" for a few weeks. Pt reports he hasn't been to dialysis in 1 week. Pt reports he has been weak and "seeing numbers" Pt supposed to wear O2 but reports he ran out. EXAM: CT HEAD WITHOUT CONTRAST TECHNIQUE: Contiguous axial images were obtained from the base of the skull through the vertex without intravenous contrast. RADIATION DOSE REDUCTION: This exam was performed according to the departmental dose-optimization program which includes automated exposure control, adjustment of the mA and/or kV according to patient size and/or use of iterative reconstruction technique. COMPARISON:  CT head 09/16/2023 FINDINGS: Brain: No evidence of large-territorial acute infarction. No parenchymal hemorrhage. No mass lesion. No extra-axial collection. No mass effect or midline shift. No hydrocephalus. Basilar cisterns are patent. Vascular: No hyperdense vessel. 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 Skull: No acute fracture or focal lesion.  Prior right craniotomy. Sinuses/Orbits: Bilateral maxillary sinus mucosal thickening. Otherwise paranasal sinuses and mastoid air cells are clear. The orbits are unremarkable. Other: None. IMPRESSION: No acute intracranial abnormality. Electronically Signed   By: Tish Frederickson M.D.   On: 09/28/2023 00:27   DG Chest Port 1 View  Result Date: 09/27/2023 CLINICAL DATA:  Shortness of breath EXAM: PORTABLE CHEST 1 VIEW COMPARISON:  09/16/2023 FINDINGS: Cardiac shadow is enlarged but stable. The lungs are well aerated bilaterally. No focal infiltrate or effusion is seen. No bony abnormality is noted. IMPRESSION: No acute abnormality noted. Electronically Signed   By: Alcide Clever  M.D.   On: 09/27/2023 21:19    Assessment/Plan:  57 year old male with history of hypertension, coronary artery disease, congestive heart failure, hyperlipidemia, peripheral vascular disease, diabetes, GERD, asthma, end-stage renal disease on dialysis was admitted through the emergency room with onset of dry cough dyspnea associated with wheezing.  He had been on dialysis and has been noncompliant with his dialysis treatments.  He missed 3 treatments as per the family.  He was given Lasix 60 mg IV push in the emergency room.  In the emergency room he was found to have a systolic blood pressure over 200 and a diastolic of over 161.  As per the ED note he had an NSTEMI.  He denies any chest pain, nausea vomiting or abdominal pain.  He had a chest x-ray in the  emergency room which was negative.   ESRD: He is now being dialyzed via left AV fistula.  He is being dialyzed on a 2K bath.  He is tolerating fluid removal.  Patient said he has not been taking his amlodipine on regular basis.  ANEMIA: Will continue to monitor closely.  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  Will monitor closely.  HTN/VOL: Blood pressure is still elevated.  Will start him on amlodipine and losartan.  Presently on nitroglycerin drip.  Diabetes: Continue insulin as ordered.  Labs and medications reviewed. Will continue to monitor closely.    LOS: 1 Lorain Childes, MD [facility] @TODAY @12 :07 PM

## 2023-09-28 NOTE — Progress Notes (Addendum)
1610 Admitted to the ICU fully clothed-soaked in urine and multiple stools in underwear. CHG and regular soap and water bath given. Very talkative but confused to time,date and situation. Dialysis nurse in room setting up. Immediately patient pulled out 2 of his 3 IVs and the third in his right [facility] will not flush or pull back blood. IV team consulted. Blood pressure high on admission. MAE without issues. Patient thinks he was in possession of a cell phone but no cell phone was found in his clothes or sheets from ED-sheets searched. Wallet, knife,silvered colored chain,gold colored ring with clear  stones. 1300 Patient finished with dialysis. 2 Liters of fluid removed with dialysis. Eyes closed. Refused to order lunch. Patient carrying on conversation with himself. Does not open eyes  Does not appear to be awake. 1600 Remains with eyes closed. More alert. Refused to eat or order any food. States "I am fine." Consistantly non compliant with all medical regiments. 1800 Remains in Sr on the monitor,Drowsy all day. Sleeping at intervals

## 2023-09-28 NOTE — Progress Notes (Addendum)
ANTICOAGULATION CONSULT NOTE  Pharmacy Consult for Heparin Infusion Indication: ACS/STEMI  Patient Measurements: Weight: 96.8 kg (213 lb 6.5 oz) Heparin Dosing Weight: 86.1 kg  Labs: Recent Labs    09/27/23 2036 09/27/23 2251 09/28/23 0045 09/28/23 0725 09/28/23 0916  HGB 10.0*  --   --  9.5*  --   HCT 30.6*  --   --  29.2*  --   PLT 201  --   --  205  --   APTT  --   --  >200*  --   --   LABPROT  --   --  15.5*  --   --   INR  --   --  1.2  --   --   HEPARINUNFRC  --   --   --   --  <0.10*  CREATININE 6.93*  --   --  7.03*  --   TROPONINIHS  --  553*  --   --  337*    Estimated Creatinine Clearance: 12.9 mL/min (A) (by C-G formula based on SCr of 7.03 mg/dL (H)).   Medical History: Past Medical History:  Diagnosis Date   Abdominal abscess    a.) chronic MRSA infection   Acute pancreatitis    Anxiety    Asthma    Brain aneurysm    a.) 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 b.) RIGHT crainotomy for clip ligation 05/14/2019.   CHF (congestive heart failure) (HCC)    Chronic back pain    Chronic heart failure with preserved ejection fraction (HFpEF) (HCC)    a. 11/2021 Echo: EF 55-60%, no rwma, mod LVH, GrI DD. Nl RV size/fxn. Mildly dil LA.   CKD (chronic kidney disease), stage V (HCC)    Depression    Emphysema of lung (HCC)    Erectile dysfunction    Followed by palliative care service    GERD (gastroesophageal reflux disease)    History of methicillin resistant staphylococcus aureus (MRSA)    HLD (hyperlipidemia)    Hypertension    Mild cognitive impairment    MRSA (methicillin resistant Staphylococcus aureus)    Obesity    OSA (obstructive sleep apnea) 2013   a.) not compliant with nocturnal PAP therapy; CPAP machine "lost or stolen".   Panic disorder    Perforated bowel (HCC) 12/10/2005   Tempoary Colostomy Bag, Skin Graft for Abd wound   Polysubstance abuse (HCC)    a.) ETOH,  tobacco, marijuana, methamphetamines, cocaine, BZO, opioids.   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   T2DM (type 2 diabetes mellitus) (HCC)    Tobacco abuse    Type 2 diabetes mellitus without complication, without long-term current use of insulin (HCC) 04/17/2016   Vitreous hemorrhage (HCC) 03/27/2011   Overview:  Bilateral; 01/2010, from [facility]     Assessment: Pt is a 57 yo male w/ ESRD on HD presenting to ED c/o weakness, SOB, & "seeing numbers"  found w/ elevated Troponin I level.  Pt reports he has not been to dialysis for 1 wk.  1019 0916 HL < 0.1, subthera; 1150 un/hr. Patient pulled out IV. Drip was off  Goal of Therapy:  Heparin level 0.3-0.7 units/ml Monitor platelets by anticoagulation protocol: Yes   Plan:  --Heparin level is subtherapeutic due to infusion being off --Heparin 2500 unit IV bolus and maintain heparin infusion rate  at 1150 units/hr --Re-check HL 8 hours re-start which was at 1030 --Daily CBC per protocol while on IV heparin  Tressie Ellis 09/28/2023 10:45 AM

## 2023-09-28 NOTE — Progress Notes (Signed)
ANTICOAGULATION CONSULT NOTE  Pharmacy Consult for Heparin Infusion Indication: ACS/STEMI  Patient Measurements: Weight: 94.8 kg (208 lb 15.9 oz) Heparin Dosing Weight: 86.1 kg  Labs: Recent Labs    09/27/23 2036 09/27/23 2251 09/28/23 0045 09/28/23 0725 09/28/23 0916 09/28/23 1806  HGB 10.0*  --   --  9.5*  --   --   HCT 30.6*  --   --  29.2*  --   --   PLT 201  --   --  205  --   --   APTT  --   --  >200*  --   --   --   LABPROT  --   --  15.5*  --   --   --   INR  --   --  1.2  --   --   --   HEPARINUNFRC  --   --   --   --  <0.10* 0.13*  CREATININE 6.93*  --   --  7.03*  --   --   TROPONINIHS  --  553*  --   --  337*  --     Estimated Creatinine Clearance: 12.7 mL/min (A) (by C-G formula based on SCr of 7.03 mg/dL (H)).   Medical History: Past Medical History:  Diagnosis Date   Abdominal abscess    a.) chronic MRSA infection   Acute pancreatitis    Anxiety    Asthma    Brain aneurysm    a.) 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 b.) RIGHT crainotomy for clip ligation 05/14/2019.   CHF (congestive heart failure) (HCC)    Chronic back pain    Chronic heart failure with preserved ejection fraction (HFpEF) (HCC)    a. 11/2021 Echo: EF 55-60%, no rwma, mod LVH, GrI DD. Nl RV size/fxn. Mildly dil LA.   CKD (chronic kidney disease), stage V (HCC)    Depression    Emphysema of lung (HCC)    Erectile dysfunction    Followed by palliative care service    GERD (gastroesophageal reflux disease)    History of methicillin resistant staphylococcus aureus (MRSA)    HLD (hyperlipidemia)    Hypertension    Mild cognitive impairment    MRSA (methicillin resistant Staphylococcus aureus)    Obesity    OSA (obstructive sleep apnea) 2013   a.) not compliant with nocturnal PAP therapy; CPAP machine "lost or stolen".   Panic disorder    Perforated bowel (HCC) 12/10/2005   Tempoary Colostomy Bag, Skin  Graft for Abd wound   Polysubstance abuse (HCC)    a.) ETOH, tobacco, marijuana, methamphetamines, cocaine, BZO, opioids.   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   T2DM (type 2 diabetes mellitus) (HCC)    Tobacco abuse    Type 2 diabetes mellitus without complication, without long-term current use of insulin (HCC) 04/17/2016   Vitreous hemorrhage (HCC) 03/27/2011   Overview:  Bilateral; 01/2010, from [facility]     Assessment: Pt is a 57 yo male w/ ESRD on HD presenting to ED c/o weakness, SOB, & "seeing numbers"  found w/ elevated Troponin I level.  Pt reports he has not been to dialysis for 1 wk.  1019 0916 HL < 0.1, subthera; 1150 un/hr. Patient pulled out IV. Drip was off 1019 1806 HL 0.13, subtherapeutic; 1150 un/hr  Goal of Therapy:  Heparin level 0.3-0.7 units/ml Monitor platelets by anticoagulation protocol: Yes   Plan:  --Heparin level is subtherapeutic  --Give heparin 2500 unit IV bolus  --Increase heparin infusion rate to 1400 units/hr --Re-check HL 8 hours after rate change --Daily CBC per protocol while on IV heparin  Merryl Hacker, PharmD Clinical Pharmacist 09/28/2023 6:51 PM

## 2023-09-28 NOTE — Assessment & Plan Note (Signed)
-   The patient will be admitted to stepdown unit bed. - Will follow serial troponins and EKGs. - The patient will be placed on aspirin as well as p.r.n. sublingual nitroglycerin and morphine sulfate for pain. - We will continue IV heparin. - We will obtain a cardiology consult in a.m. for further cardiac risk stratification. - I notified Dr. Elberta Fortis about the patient

## 2023-09-28 NOTE — H&P (Signed)
[place]   PATIENT NAME: Derek Cooley    MR#:  [id number]  DATE OF BIRTH:  1966/09/19  DATE OF ADMISSION:  09/27/2023  PRIMARY CARE PHYSICIAN: Larae Grooms, NP   Patient is coming from: Home  REQUESTING/REFERRING PHYSICIAN: Corena Herter, MD   CHIEF COMPLAINT:   Chief Complaint  Patient presents with   Shortness of Breath    HISTORY OF PRESENT ILLNESS:  Derek Jasmine. is a 57 y.o. male with medical history significant for asthma, CHF, ESRD on HD, GERD, hypertension and dyslipidemia as well as type 2 diabetes mellitus who presented to the emergency room with acute onset of worsening dyspnea with associated dry cough and occasional wheezing.  He denied any current chest pain but has been having right-sided chest pain recently.  He admits to chills without fever.  No nausea or vomiting or abdominal pain.  No dysuria, oliguria or hematuria or flank pain.  No bleeding diathesis.  He stated that he has not used methamphetamine or smoke cigarettes in a while.  ED Course: When he came to the ER BP was 231/112 with respiratory rate of 28 with otherwise normal vital signs.  Labs revealed a BUN of 96 with creatinine 6.93 blood glucose of 138 and otherwise unremarkable CMP.  High sensitive troponin I was 553 and BNP 573.4.  CBC showed anemia and INR is 1.2 with PT 15.5 EKG as reviewed by me : EKG showed normal sinus rhythm with a rate of 92 with Q waves anteroseptally. Imaging: Portable chest x-ray showed no acute cardiopulmonary disease.  The patient was given 60 mg of IV Lasix, IV heparin bolus and drip, DuoNeb, Solu-Medrol 125 mg IV, 1 inch of nitro paste followed by IV nitroglycerin drip.  He will be admitted to a stepdown unit bed for further evaluation and management. PAST MEDICAL HISTORY:   Past Medical History:  Diagnosis Date   Abdominal abscess    a.) chronic MRSA infection   Acute pancreatitis    Anxiety    Asthma    Brain aneurysm    a.) spontaneous  rupture --> SAH from RIGHT PComm --> coil embolization 01/26/2010 with known remaining neck remnant. b.) RIGHT crainotomy for clip ligation 05/14/2019.   CHF (congestive heart failure) (HCC)    Chronic back pain    Chronic heart failure with preserved ejection fraction (HFpEF) (HCC)    a. 11/2021 Echo: EF 55-60%, no rwma, mod LVH, GrI DD. Nl RV size/fxn. Mildly dil LA.   CKD (chronic kidney disease), stage V (HCC)    Depression    Emphysema of lung (HCC)    Erectile dysfunction    Followed by palliative care service    GERD (gastroesophageal reflux disease)    History of methicillin resistant staphylococcus aureus (MRSA)    HLD (hyperlipidemia)    Hypertension    Mild cognitive impairment    MRSA (methicillin resistant Staphylococcus aureus)    Obesity    OSA (obstructive sleep apnea) 2013   a.) not compliant with nocturnal PAP therapy; CPAP machine "lost or stolen".   Panic disorder    Perforated bowel (HCC) 12/10/2005   Tempoary Colostomy Bag, Skin Graft for Abd wound   Polysubstance abuse (HCC)    a.) ETOH, tobacco, marijuana, methamphetamines, cocaine, BZO, opioids.   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   T2DM (type 2 diabetes mellitus) (HCC)  Tobacco abuse    Type 2 diabetes mellitus without complication, without long-term current use of insulin (HCC) 04/17/2016   Vitreous hemorrhage (HCC) 03/27/2011   Overview:  Bilateral; 01/2010, from [facility]     PAST SURGICAL HISTORY:   Past Surgical History:  Procedure Laterality Date   A/V FISTULAGRAM Left 07/16/2022   Procedure: A/V Fistulagram;  Surgeon: Annice Needy, MD;  Location: [facility] INVASIVE CV [facility];  Service: Cardiovascular;  Laterality: Left;   AV FISTULA PLACEMENT Left 05/24/2022   Procedure: ARTERIOVENOUS (AV) FISTULA CREATION ( RADIAL CEPHALIC);  Surgeon: Annice Needy, MD;  Location: [facility] ORS;  Service:  Vascular;  Laterality: Left;   CEREBRAL ANEURYSM REPAIR Right 01/26/2010   Procedure: CEREBRAL ANEURYSM REPAIR (COIL EMBOLIZATION)   CEREBRAL ANEURYSM REPAIR Right 05/14/2019   Procedure: CRAINOTOMY FOR CEREBRAL ANEURYSM REPAIR (CLIP LIGATION)   COLON SURGERY  12/10/2005   colostomy bag placed s/p perforated bowel   COLONOSCOPY WITH PROPOFOL N/A 05/18/2020   Procedure: COLONOSCOPY WITH PROPOFOL;  Surgeon: Toney Reil, MD;  Location: [facility] ENDOSCOPY;  Service: Gastroenterology;  Laterality: N/A;   DIALYSIS/PERMA CATHETER INSERTION N/A 06/27/2022   Procedure: DIALYSIS/PERMA CATHETER INSERTION;  Surgeon: Renford Dills, MD;  Location: [facility] CV [facility];  Service: Cardiovascular;  Laterality: N/A;   DIALYSIS/PERMA CATHETER INSERTION N/A 06/28/2023   Procedure: DIALYSIS/PERMA CATHETER INSERTION;  Surgeon: Renford Dills, MD;  Location: [facility];  Service: Cardiovascular;  Laterality: N/A;   DIALYSIS/PERMA CATHETER REMOVAL N/A 05/20/2023   Procedure: DIALYSIS/PERMA CATHETER REMOVAL;  Surgeon: Annice Needy, MD;  Location: [facility];  Service: Cardiovascular;  Laterality: N/A;   DIALYSIS/PERMA CATHETER REMOVAL N/A 09/09/2023   Procedure: DIALYSIS/PERMA CATHETER REMOVAL;  Surgeon: Annice Needy, MD;  Location: [facility] CV [facility];  Service: Cardiovascular;  Laterality: N/A;   KNEE SURGERY Right    Perforated bowel      SOCIAL HISTORY:   Social History   Tobacco Use   Smoking status: Every Day    Current packs/day: 1.00    Average packs/day: 1 pack/day for 40.0 years (40.0 ttl pk-yrs)    Types: Cigarettes    Start date: 10/11/1984   Smokeless tobacco: Never  Substance Use Topics   Alcohol use: Yes    FAMILY HISTORY:   Family History  Problem Relation Age of Onset   Arthritis Mother    Asthma Mother    Diabetes Mother    Heart disease Mother    Hyperlipidemia Mother    Hypertension Mother    Kidney disease Mother    Thyroid disease Mother     Lung disease Mother    Anxiety disorder Mother    Depression Mother    Diabetes Father    Heart disease Father    Depression Father    Anxiety disorder Father    Arthritis Sister    Asthma Sister    Hyperlipidemia Sister    Hypertension Sister    Lung disease Sister    Anxiety disorder Sister    Depression Sister    Hyperlipidemia Brother    Hypertension Brother    Diabetes Sister    Heart disease Sister    Depression Sister    Anxiety disorder Sister    Anxiety disorder Brother    Depression Brother    Heart disease Brother     DRUG ALLERGIES:   Allergies  Allergen Reactions   Atorvastatin     Joint Aches - Severe Joint Aches - Severe Joint Aches - Severe  Avelox [Moxifloxacin Hcl In Nacl]     Muscle pain   Buprenorphine     Mouth sores, confusion, shaking   Dilaudid [Hydromorphone Hcl] Hives   Fluoxetine Itching   Levofloxacin Other (See Comments)    Joint Pain   Morphine And Codeine    Other     Muscle pain   Suboxone [Buprenorphine Hcl-Naloxone Hcl] Other (See Comments)    Rash and confused   Vancomycin     Renal insufficiency    REVIEW OF SYSTEMS:   ROS As per history of present illness. All pertinent systems were reviewed above. Constitutional, HEENT, cardiovascular, respiratory, GI, GU, musculoskeletal, neuro, psychiatric, endocrine, integumentary and hematologic systems were reviewed and are otherwise negative/unremarkable except for positive findings mentioned above in the HPI.   MEDICATIONS AT HOME:   Prior to Admission medications   Medication Sig Start Date End Date Taking? Authorizing Provider  albuterol (VENTOLIN HFA) 108 (90 Base) MCG/ACT inhaler Inhale 1-2 puffs into the lungs every 6 (six) hours as needed for wheezing or shortness of breath. 09/18/23  Yes Loyce Dys, MD  amLODipine (NORVASC) 10 MG tablet Take 10 mg by mouth daily.   Yes [provider]  DULoxetine (CYMBALTA) 60 MG capsule Take 1 capsule (60 mg total) by mouth  daily. 11/24/22 09/28/23 Yes Pokhrel, Laxman, MD  lidocaine-prilocaine (EMLA) cream Apply 1 Application topically daily. 08/23/22  Yes [provider]  losartan (COZAAR) 100 MG tablet Take 100 mg by mouth daily.   Yes [provider]  pantoprazole (PROTONIX) 40 MG tablet TAKE 1 TABLET(40 MG) BY MOUTH TWICE DAILY AS NEEDED 01/23/23  Yes Larae Grooms, NP  topiramate (TOPAMAX) 50 MG tablet TAKE 1 TABLET(50 MG) BY MOUTH TWICE DAILY 01/07/23  Yes Larae Grooms, NP  TRELEGY ELLIPTA 100-62.5-25 MCG/ACT AEPB Inhale 1 puff into the lungs daily. 06/10/23  Yes Mecum, Erin E, PA-C  glucose blood (ONETOUCH ULTRA) test strip USE TO TEST BLOOD SUGAR DAILY Patient not taking: Reported on 09/16/2023 08/23/20   Valentino Nose, NP  OXYGEN Inhale 3 L into the lungs daily.    [provider]      VITAL SIGNS:  Blood pressure (!) 218/94, pulse 71, temperature 98 F (36.7 C), temperature source Axillary, resp. rate (!) 21, SpO2 100%.  PHYSICAL EXAMINATION:  Physical Exam  GENERAL:  57 y.o.-year-old male patient lying in the bed with mild aspiratory distress with conversational dyspnea. EYES: Pupils equal, round, reactive to light and accommodation. No scleral icterus. Extraocular muscles intact.  HEENT: Head atraumatic, normocephalic. Oropharynx and nasopharynx clear.  NECK:  Supple, no jugular venous distention. No thyroid enlargement, no tenderness.  LUNGS: Mildly diminished bibasal breath sounds with occasional expiratory wheezes and bibasilar rales.  No use of accessory muscles of respiration.  CARDIOVASCULAR: Regular rate and rhythm, S1, S2 normal. No murmurs, rubs, or gallops.  ABDOMEN: Soft, nondistended, nontender. Bowel sounds present. No organomegaly or mass.  EXTREMITIES: No pedal edema, cyanosis, or clubbing.  NEUROLOGIC: Cranial nerves II through XII are intact. Muscle strength 5/5 in all extremities. Sensation intact. Gait not checked.  PSYCHIATRIC: The patient is  alert and oriented x 3.  Normal affect and good eye contact. SKIN: No obvious rash, lesion, or ulcer.   LABORATORY PANEL:   CBC Recent Labs  Lab 09/27/23 2036  WBC 8.3  HGB 10.0*  HCT 30.6*  PLT 201   ------------------------------------------------------------------------------------------------------------------  Chemistries  Recent Labs  Lab 09/27/23 2036 09/27/23 2251  NA 142  --  K 4.0  --   CL 106  --   CO2 24  --   GLUCOSE 138*  --   BUN 96*  --   CREATININE 6.93*  --   CALCIUM 8.1*  --   AST  --  19  ALT  --  23  ALKPHOS  --  82  BILITOT  --  1.0   ------------------------------------------------------------------------------------------------------------------  Cardiac Enzymes No results for input(s): "TROPONINI" in the last 168 hours. ------------------------------------------------------------------------------------------------------------------  RADIOLOGY:  CT Head Wo Contrast  Result Date: 09/28/2023 CLINICAL DATA:  Mental status change, unknown cause Pt reports SHOB and feeling "off" for a few weeks. Pt reports he hasn't been to dialysis in 1 week. Pt reports he has been weak and "seeing numbers" Pt supposed to wear O2 but reports he ran out. EXAM: CT HEAD WITHOUT CONTRAST TECHNIQUE: Contiguous axial images were obtained from the base of the skull through the vertex without intravenous contrast. RADIATION DOSE REDUCTION: This exam was performed according to the departmental dose-optimization program which includes automated exposure control, adjustment of the mA and/or kV according to patient size and/or use of iterative reconstruction technique. COMPARISON:  CT head 09/16/2023 FINDINGS: Brain: No evidence of large-territorial acute infarction. No parenchymal hemorrhage. No mass lesion. No extra-axial collection. No mass effect or midline shift. No hydrocephalus. Basilar cisterns are patent. Vascular: No hyperdense vessel. Coil embolization and clipping of  prior aneurysm right ICA terminus. Skull: No acute fracture or focal lesion.  Prior right craniotomy. Sinuses/Orbits: Bilateral maxillary sinus mucosal thickening. Otherwise paranasal sinuses and mastoid air cells are clear. The orbits are unremarkable. Other: None. IMPRESSION: No acute intracranial abnormality. Electronically Signed   By: Tish Frederickson M.D.   On: 09/28/2023 00:27   DG Chest Port 1 View  Result Date: 09/27/2023 CLINICAL DATA:  Shortness of breath EXAM: PORTABLE CHEST 1 VIEW COMPARISON:  09/16/2023 FINDINGS: Cardiac shadow is enlarged but stable. The lungs are well aerated bilaterally. No focal infiltrate or effusion is seen. No bony abnormality is noted. IMPRESSION: No acute abnormality noted. Electronically Signed   By: Alcide Clever M.D.   On: 09/27/2023 21:19      IMPRESSION AND PLAN:  Assessment and Plan: * NSTEMI (non-ST elevated myocardial infarction) (HCC) - The patient will be admitted to stepdown unit bed. - Will follow serial troponins and EKGs. - The patient will be placed on aspirin as well as p.r.n. sublingual nitroglycerin and morphine sulfate for pain. - We will continue IV heparin. - We will obtain a cardiology consult in a.m. for further cardiac risk stratification. - I notified Dr. Elberta Fortis about the patient   End-stage renal disease on hemodialysis (HCC) - She has a BNP and clinically manifest mild fluid overload. - Nephrology consult will be obtained for follow-up on hemodialysis. - I notified Dr. Micki Riley about the patient. - The patient was given a dose of IV Lasix.  Essential hypertension - We will continue antihypertensive therapy.  Type 2 diabetes mellitus with chronic kidney disease, without long-term current use of insulin (HCC) - The patient be placed on supplemental coverage with NovoLog.  Anxiety and depression - We will continue Cymbalta and Topamax.  GERD without esophagitis - Will continue PPI.       DVT prophylaxis: Lovenox.   Advanced Care Planning:  Code Status: full code.  Family Communication:  The plan of care was discussed in details with the patient (and family). I answered all questions. The patient agreed to proceed with the above mentioned plan.  Further management will depend upon hospital course. Disposition Plan: Back to previous home environment Consults called: Cardiology and nephrology All the records are reviewed and case discussed with ED provider.  Status is: Inpatient    At the time of the admission, it appears that the appropriate admission status for this patient is inpatient.  This is judged to be reasonable and necessary in order to provide the required intensity of service to ensure the patient's safety given the presenting symptoms, physical exam findings and initial radiographic and laboratory data in the context of comorbid conditions.  The patient requires inpatient status due to high intensity of service, high risk of further deterioration and high frequency of surveillance required.  I certify that at the time of admission, it is my clinical judgment that the patient will require inpatient hospital care extending more than 2 midnights.                            Dispo: The patient is from: Home              Anticipated d/c is to: Home              Patient currently is not medically stable to d/c.              Difficult to place patient: No  Hannah Beat M.D on 09/28/2023 at 5:49 AM  [facility]   From 7 PM-7 AM, contact night-coverage [email or web address]  CC: Primary care physician; Larae Grooms, NP

## 2023-09-28 NOTE — Assessment & Plan Note (Signed)
Will continue PPI

## 2023-09-28 NOTE — Progress Notes (Signed)
Brief hospitalist update note.  This is a nonbillable note.  Please see same-day H&P for full billable details.  Briefly, this is a 57 year old male history significant for ESRD on HD, CHF, hypertension, hyperlipidemia, chronic hypoxic respiratory failure who presents to the ED with progressively worsening dyspnea associated with cough and wheeze  Patient has a long history of medication and treatment nonadherence.  Frequently misses dialysis.  Patient states he missed his last 3 dialysis sessions.  He is markedly hypertensive with systolics greater than 200.  Currently on nitroglycerin drip.  Plan: Trend troponins to peak Heparin GTT x 48 hours Stat dialysis Wean nitroglycerin drip as able Losartan and amlodipine added per nephrology May transfer out of ICU if able to wean down nitroglycerin drip to less than 60 mcg/h  Lolita Patella MD  No charge

## 2023-09-28 NOTE — ED Notes (Signed)
In CT

## 2023-09-29 ENCOUNTER — Inpatient Hospital Stay (HOSPITAL_COMMUNITY)
Admit: 2023-09-29 | Discharge: 2023-09-29 | Disposition: A | Discharge status: Home / Self Care | Payer: Medicare Other | Attending: Cardiology | Admitting: Cardiology

## 2023-09-29 ENCOUNTER — Other Ambulatory Visit: Payer: Self-pay

## 2023-09-29 DIAGNOSIS — I4729 Other ventricular tachycardia: Secondary | ICD-10-CM | POA: Diagnosis not present

## 2023-09-29 DIAGNOSIS — F17200 Nicotine dependence, unspecified, uncomplicated: Secondary | ICD-10-CM | POA: Diagnosis not present

## 2023-09-29 DIAGNOSIS — R7989 Other specified abnormal findings of blood chemistry: Secondary | ICD-10-CM

## 2023-09-29 DIAGNOSIS — I214 Non-ST elevation (NSTEMI) myocardial infarction: Secondary | ICD-10-CM | POA: Diagnosis not present

## 2023-09-29 DIAGNOSIS — I160 Hypertensive urgency: Secondary | ICD-10-CM

## 2023-09-29 DIAGNOSIS — I5030 Unspecified diastolic (congestive) heart failure: Secondary | ICD-10-CM | POA: Diagnosis not present

## 2023-09-29 LAB — CBC
HCT: 28.6 % — ABNORMAL LOW (ref 39.0–52.0)
Hemoglobin: 9.5 g/dL — ABNORMAL LOW (ref 13.0–17.0)
MCH: 31.6 pg (ref 26.0–34.0)
MCHC: 33.2 g/dL (ref 30.0–36.0)
MCV: 95 fL (ref 80.0–100.0)
Platelets: 203 10*3/uL (ref 150–400)
RBC: 3.01 MIL/uL — ABNORMAL LOW (ref 4.22–5.81)
RDW: 13.7 % (ref 11.5–15.5)
WBC: 13 10*3/uL — ABNORMAL HIGH (ref 4.0–10.5)
nRBC: 0 % (ref 0.0–0.2)

## 2023-09-29 LAB — HEPATITIS B SURFACE ANTIBODY, QUANTITATIVE: Hep B S AB Quant (Post): <3.5 m[IU]/mL — ABNORMAL LOW

## 2023-09-29 LAB — ECHOCARDIOGRAM COMPLETE
AR max vel: 2.69 cm2
AV Area VTI: 3.3 cm2
AV Area mean vel: 3.13 cm2
AV Mean grad: 5 mm[Hg]
AV Peak grad: 11.7 mm[Hg]
Ao pk vel: 1.71 m/s
Area-P 1/2: 3.13 cm2
MV VTI: 2.69 cm2
S' Lateral: 3.3 cm
Weight: 3343.94 [oz_av]

## 2023-09-29 LAB — HEPARIN LEVEL (UNFRACTIONATED)
Heparin Unfractionated: 0.35 [IU]/mL (ref 0.30–0.70)
Heparin Unfractionated: 0.21 [IU]/mL — ABNORMAL LOW (ref 0.30–0.70)

## 2023-09-29 LAB — GLUCOSE, CAPILLARY
Glucose-Capillary: 153 mg/dL — ABNORMAL HIGH (ref 70–99)
Glucose-Capillary: 115 mg/dL — ABNORMAL HIGH (ref 70–99)

## 2023-09-29 LAB — POTASSIUM: Potassium: 3.2 mmol/L — ABNORMAL LOW (ref 3.5–5.1)

## 2023-09-29 LAB — MAGNESIUM: Magnesium: 2 mg/dL (ref 1.7–2.4)

## 2023-09-29 MED ORDER — POTASSIUM CHLORIDE CRYS ER 20 MEQ PO TBCR
40.0000 meq | EXTENDED_RELEASE_TABLET | Freq: Once | ORAL | Status: AC
  Administered 2023-09-29: 40 meq via ORAL

## 2023-09-29 MED ORDER — HEPARIN BOLUS VIA INFUSION
1300.0000 [IU] | Freq: Once | INTRAVENOUS | Status: AC
  Administered 2023-09-29: 1300 [IU] via INTRAVENOUS
  Filled 2023-09-29: qty 1300

## 2023-09-29 MED ORDER — CARVEDILOL 3.125 MG PO TABS
6.2500 mg | ORAL_TABLET | Freq: Two times a day (BID) | ORAL | Status: DC
  Administered 2023-09-29 – 2023-10-03 (×9): 6.25 mg via ORAL
  Filled 2023-09-29 (×6): qty 2
  Filled 2023-09-29: qty 1
  Filled 2023-09-29 (×2): qty 2

## 2023-09-29 NOTE — Plan of Care (Signed)

## 2023-09-29 NOTE — Progress Notes (Signed)
  Echocardiogram 2D Echocardiogram has been performed.  Soraya Paquette C Ady Heimann 09/29/2023, 11:14 AM

## 2023-09-29 NOTE — Progress Notes (Signed)
ANTICOAGULATION CONSULT NOTE  Pharmacy Consult for Heparin Infusion Indication: ACS/STEMI  Patient Measurements: Weight: 94.8 kg (208 lb 15.9 oz) Heparin Dosing Weight: 86.1 kg  Labs: Recent Labs    09/27/23 2036 09/27/23 2251 09/28/23 0045 09/28/23 0725 09/28/23 0916 09/28/23 1806 09/29/23 0718  HGB 10.0*  --   --  9.5*  --   --  9.5*  HCT 30.6*  --   --  29.2*  --   --  28.6*  PLT 201  --   --  205  --   --  203  APTT  --   --  >200*  --   --   --   --   LABPROT  --   --  15.5*  --   --   --   --   INR  --   --  1.2  --   --   --   --   HEPARINUNFRC  --   --   --   --  <0.10* 0.13* 0.35  CREATININE 6.93*  --   --  7.03*  --   --   --   TROPONINIHS  --  553*  --   --  337*  --   --     Estimated Creatinine Clearance: 12.7 mL/min (A) (by C-G formula based on SCr of 7.03 mg/dL (H)).   Medical History: Past Medical History:  Diagnosis Date   Abdominal abscess    a.) chronic MRSA infection   Acute pancreatitis    Anxiety    Asthma    Brain aneurysm    a.) 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 b.) RIGHT crainotomy for clip ligation 05/14/2019.   CHF (congestive heart failure) (HCC)    Chronic back pain    Chronic heart failure with preserved ejection fraction (HFpEF) (HCC)    a. 11/2021 Echo: EF 55-60%, no rwma, mod LVH, GrI DD. Nl RV size/fxn. Mildly dil LA.   CKD (chronic kidney disease), stage V (HCC)    Depression    Emphysema of lung (HCC)    Erectile dysfunction    Followed by palliative care service    GERD (gastroesophageal reflux disease)    History of methicillin resistant staphylococcus aureus (MRSA)    HLD (hyperlipidemia)    Hypertension    Mild cognitive impairment    MRSA (methicillin resistant Staphylococcus aureus)    Obesity    OSA (obstructive sleep apnea) 2013   a.) not compliant with nocturnal PAP therapy; CPAP machine "lost or stolen".   Panic disorder     Perforated bowel (HCC) 12/10/2005   Tempoary Colostomy Bag, Skin Graft for Abd wound   Polysubstance abuse (HCC)    a.) ETOH, tobacco, marijuana, methamphetamines, cocaine, BZO, opioids.   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   T2DM (type 2 diabetes mellitus) (HCC)    Tobacco abuse    Type 2 diabetes mellitus without complication, without long-term current use of insulin (HCC) 04/17/2016   Vitreous hemorrhage (HCC) 03/27/2011   Overview:  Bilateral; 01/2010, from [facility]     Assessment: Pt is a 57 yo male w/ ESRD on HD presenting to ED c/o weakness, SOB, & "seeing numbers"  found w/ elevated Troponin I level.  Pt reports he has not been to dialysis for 1 wk.  1019 0916 HL < 0.1,  subthera; 1150 un/hr. Patient pulled out IV. Drip was off 1019 1806 HL 0.13, subtherapeutic; 1150 un/hr 10/20 0718 HL 0.35   Goal of Therapy:  Heparin level 0.3-0.7 units/ml Monitor platelets by anticoagulation protocol: Yes   Plan:  Heparin level is therapeutic. Will continue heparin infusion at 1400 units/hr. Recheck heparin level in 8 hours. CBC daily while on heparin.   Ronnald Ramp, PharmD, BCPS Clinical Pharmacist 09/29/2023 7:52 AM

## 2023-09-29 NOTE — Progress Notes (Signed)
[facility]  PROGRESS NOTE   Subjective:   Feels better today.  Off of the nitro drip. Much more awake and ate well today. Denies any shortness of breath. Had stable dialysis with 3 L fluid removal yesterday. Blood pressure has improved.  Objective:  Vital signs: Blood pressure (!) 175/85, pulse 72, temperature 98.1 F (36.7 C), resp. rate (!) 27, weight 94.8 kg, SpO2 99%.  Intake/Output Summary (Last 24 hours) at 09/29/2023 1127 Last data filed at 09/29/2023 1100 Gross per 24 hour  Intake 737.94 ml  Output 2250 ml  Net -1512.06 ml   Filed Weights   09/28/23 0844 09/28/23 1305  Weight: 96.8 kg 94.8 kg     Physical Exam: General:  No acute distress  Head:  Normocephalic, atraumatic. Moist oral mucosal membranes  Eyes:  Anicteric  Neck:  Supple  Lungs:   Clear to auscultation, normal effort  Heart:  S1S2 no rubs  Abdomen:   Soft, nontender, bowel sounds present  Extremities:  peripheral edema.  Neurologic:  Awake, alert, following commands  Skin:  No lesions  Access:     Basic Metabolic Panel: Recent Labs  Lab 09/27/23 2036 09/28/23 0725 09/29/23 0718  NA 142 139  --   K 4.0 4.4 3.2*  CL 106 104  --   CO2 24 22  --   GLUCOSE 138* 198*  --   BUN 96* 95*  --   CREATININE 6.93* 7.03*  --   CALCIUM 8.1* 8.0*  --   MG  --   --  2.0   GFR: Estimated Creatinine Clearance: 12.7 mL/min (A) (by C-G formula based on SCr of 7.03 mg/dL (H)).  Liver Function Tests: Recent Labs  Lab 09/27/23 2251  AST 19  ALT 23  ALKPHOS 82  BILITOT 1.0  PROT 6.8  ALBUMIN 3.6   No results for input(s): "LIPASE", "AMYLASE" in the last 168 hours. No results for input(s): "AMMONIA" in the last 168 hours.  CBC: Recent Labs  Lab 09/27/23 2036 09/28/23 0725 09/29/23 0718  WBC 8.3 8.4 13.0*  HGB 10.0* 9.5* 9.5*  HCT 30.6* 29.2* 28.6*  MCV 99.4 98.0 95.0  PLT 201 205 203     HbA1C: HB A1C (BAYER DCA - WAIVED)  Date/Time Value Ref Range Status   04/17/2016 08:25 AM 5.6 <7.0 % Final    Comment:                                          Diabetic Adult            <7.0                                       Healthy Adult        4.3 - 5.7                                                           (DCCT/NGSP) American Diabetes Association's Summary of Glycemic Recommendations for Adults with Diabetes: Hemoglobin A1c <7.0%. More stringent glycemic goals (A1c <6.0%) may further reduce complications at the cost of increased risk of  hypoglycemia.    Hgb A1c MFr Bld  Date/Time Value Ref Range Status  06/10/2023 02:31 PM 5.5 4.8 - 5.6 % Final    Comment:             Prediabetes: 5.7 - 6.4          Diabetes: >6.4          Glycemic control for adults with diabetes: <7.0   01/07/2023 04:10 PM 6.3 (H) 4.8 - 5.6 % Final    Comment:             Prediabetes: 5.7 - 6.4          Diabetes: >6.4          Glycemic control for adults with diabetes: <7.0     Urinalysis: No results for input(s): "COLORURINE", "LABSPEC", "PHURINE", "GLUCOSEU", "HGBUR", "BILIRUBINUR", "KETONESUR", "PROTEINUR", "UROBILINOGEN", "NITRITE", "LEUKOCYTESUR" in the last 72 hours.  Invalid input(s): "APPERANCEUR"    Imaging: CT Head Wo Contrast  Result Date: 09/28/2023 CLINICAL DATA:  Mental status change, unknown cause Pt reports SHOB and feeling "off" for a few weeks. Pt reports he hasn't been to dialysis in 1 week. Pt reports he has been weak and "seeing numbers" Pt supposed to wear O2 but reports he ran out. EXAM: CT HEAD WITHOUT CONTRAST TECHNIQUE: Contiguous axial images were obtained from the base of the skull through the vertex without intravenous contrast. RADIATION DOSE REDUCTION: This exam was performed according to the departmental dose-optimization program which includes automated exposure control, adjustment of the mA and/or kV according to patient size and/or use of iterative reconstruction technique. COMPARISON:  CT head 09/16/2023 FINDINGS: Brain: No  evidence of large-territorial acute infarction. No parenchymal hemorrhage. No mass lesion. No extra-axial collection. No mass effect or midline shift. No hydrocephalus. Basilar cisterns are patent. Vascular: No hyperdense vessel. 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 Skull: No acute fracture or focal lesion.  Prior right craniotomy. Sinuses/Orbits: Bilateral maxillary sinus mucosal thickening. Otherwise paranasal sinuses and mastoid air cells are clear. The orbits are unremarkable. Other: None. IMPRESSION: No acute intracranial abnormality. Electronically Signed   By: Tish Frederickson M.D.   On: 09/28/2023 00:27   DG Chest Port 1 View  Result Date: 09/27/2023 CLINICAL DATA:  Shortness of breath EXAM: PORTABLE CHEST 1 VIEW COMPARISON:  09/16/2023 FINDINGS: Cardiac shadow is enlarged but stable. The lungs are well aerated bilaterally. No focal infiltrate or effusion is seen. No bony abnormality is noted. IMPRESSION: No acute abnormality noted. Electronically Signed   By: Alcide Clever M.D.   On: 09/27/2023 21:19     Medications:    anticoagulant sodium citrate     heparin 1,400 Units/hr (09/29/23 1045)    amLODipine  10 mg Oral Daily   aspirin EC  81 mg Oral Daily   carvedilol  6.25 mg Oral BID WC   Chlorhexidine Gluconate Cloth  6 each Topical Q0600   DULoxetine  60 mg Oral Daily   fluticasone furoate-vilanterol  1 puff Inhalation Daily   And   umeclidinium bromide  1 puff Inhalation Daily   insulin aspart  0-15 Units Subcutaneous TID WC   insulin aspart  0-5 Units Subcutaneous QHS   losartan  100 mg Oral Daily   nicotine  21 mg Transdermal Daily   pantoprazole  40 mg Oral Daily   topiramate  50 mg Oral BID    Assessment/ Plan:     57 year old male with history of hypertension, coronary artery disease, congestive  heart failure, hyperlipidemia, peripheral vascular disease, diabetes, GERD, asthma, end-stage renal disease on dialysis was admitted through the  emergency room with onset of dry cough, dyspnea associated with wheezing.  He had been on dialysis and has been noncompliant with his dialysis treatments.  He missed 3 treatments as per the family.  He also had NSTEMI and is on heparin drip at this time.     ESRD: He is now being dialyzed via left AV fistula.  Had stable dialysis yesterday.  Will schedule him for dialysis again tomorrow morning.  Will attempt to remove the 3 L of fluid.  ANEMIA: Will continue to monitor closely.   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  Will monitor closely.   HTN/VOL: Blood pressure is still elevated.  Now on amlodipine, carvedilol and losartan.  Will increase carvedilol to 12.5 mg twice a day.  I would also like to start him on furosemide orally.    Diabetes: Continue insulin as ordered.   Spoke to the patient in detail and answered all his questions to his satisfaction.   Labs and medications reviewed. Will continue to monitor closely.    LOS: 2 Lorain Childes, MD [facility] 10/20/202411:27 AM

## 2023-09-29 NOTE — Progress Notes (Signed)
29 beat run of Vtach with L arm pain, patient states he felt funny during episode, but feels better now. Attempted to notify Dr. Arville Care by page at [phone]

## 2023-09-29 NOTE — Progress Notes (Addendum)
Patient Name: Derek Cooley. Date of Encounter: 09/29/2023 [facility] Cardiologist: Lorine Bears, MD   Interval Summary  .    Patient seen on a.m. rounds.  Resting in the ICU in bed in no acute distress.  When asking questions patient stated he just wanted to sleep.  Does state that he feels better.  Underwent dialysis yesterday.  -1.2 L output in the last 24 hours  Vital Signs .    Vitals:   09/29/23 0300 09/29/23 0400 09/29/23 0500 09/29/23 0600  BP: (!) 149/79 (!) 149/66 (!) 160/72 (!) 172/78  Pulse: 70 72 63 79  Resp: 16 (!) 21 (!) 21 20  Temp:  98.2 F (36.8 C)    TempSrc:  Oral    SpO2: 99% 94% 97% 99%  Weight:        Intake/Output Summary (Last 24 hours) at 09/29/2023 0758 Last data filed at 09/28/2023 2012 Gross per 24 hour  Intake 737.94 ml  Output 2000 ml  Net -1262.06 ml      09/28/2023    1:05 PM 09/28/2023    8:44 AM 09/18/2023    8:44 AM  Last 3 Weights  Weight (lbs) 208 lb 15.9 oz 213 lb 6.5 oz 207 lb 12.8 oz  Weight (kg) 94.8 kg 96.8 kg 94.257 kg      Telemetry/ECG    Sinus rhythm rates of 80-90 and occasional PVCs was noted to have a 31 beat run of ventricular tachycardia around 623 this morning- Personally Reviewed  Physical Exam .   GEN: No acute distress.   Neck: No JVD Cardiac: RRR, no murmurs, rubs, or gallops.  Respiratory: Clear to auscultation bilaterally. GI: Soft, nontender, non-distended  MS: No edema  Assessment & Plan .     Chronic HFpEF -Denies any continued shortness of breath --1.2 L output in the last 24 hours -BNP 573.4 -Fluid removal per dialysis -Not a candidate for SGLT2 inhibitors due to dialysis or MRAs -Continue with heart failure education -Updated echocardiogram ordered and pending with further recommendations to follow -Daily weights, I's and O's, low-sodium diet  Elevated high-sensitivity troponins -Continues to deny chest discomfort -Likely demand ischemia from hypertensive urgency,  volume overload, missed dialysis -No ischemic changes noted on EKG -Heparin drip x 48 hours then DC -Longstanding history of drug use -Echocardiogram ordered and pending to assess wall motion abnormalities -No current plans for further ischemic evaluation -Continue on telemetry monitoring -Continue on aspirin -EKG as needed for pain or changes  NSVT on telemetry -31 beat run of V. tach noted on telemetry and 623 this morning -Starting on low-dose carvedilol 6.25 mg twice daily -Continue with telemetry monitoring  Hypertension -Blood pressure 172/67 -Continued on amlodipine and losartan ( started by Nephrology) -Nitroglycerin drip had been weaned off -Vital signs per unit protocol  Hypokalemia -Serum potassium 3.2 -Magnesium 2.0 -Potassium supplementations ordered -Recommend to keep potassium greater than 4 less than 5 magnesium greater than 2 -likely culprit of 31 beat run ventricular tachycardia noted on telemetry this morning -Monitor/trend/replete electrolytes as needed -Daily BMP  End-stage renal disease on hemodialysis -Fluid removal per nephrology -Dialysis per nephrology  Hyperlipidemia -LDL 84 -Previously was on ezetimibe and fenofibrate  Type 2 diabetes -Continued on insulin -Continue management per IM  COPD with continued tobacco abuse -COPD not currently in exacerbation continue on bronchodilators -Nicotine patch -Total cessation is recommended  Noncompliance -Recurrent hospitalizations due to noncompliance -Reiterated importance of medication prophylaxis For questions or updates, please contact [facility] Please consult [email or web address] for contact info under        Signed, Makaelah Cranfield, NP

## 2023-09-29 NOTE — Progress Notes (Signed)
ANTICOAGULATION CONSULT NOTE  Pharmacy Consult for Heparin Infusion Indication: ACS/STEMI  Patient Measurements: Weight: 94.8 kg (208 lb 15.9 oz) Heparin Dosing Weight: 86.1 kg  Labs: Recent Labs    09/27/23 2036 09/27/23 2251 09/28/23 0045 09/28/23 0725 09/28/23 0916 09/28/23 0916 09/28/23 1806 09/29/23 0718 09/29/23 1449  HGB 10.0*  --   --  9.5*  --   --   --  9.5*  --   HCT 30.6*  --   --  29.2*  --   --   --  28.6*  --   PLT 201  --   --  205  --   --   --  203  --   APTT  --   --  >200*  --   --   --   --   --   --   LABPROT  --   --  15.5*  --   --   --   --   --   --   INR  --   --  1.2  --   --   --   --   --   --   HEPARINUNFRC  --   --   --   --  <0.10*   < > 0.13* 0.35 0.21*  CREATININE 6.93*  --   --  7.03*  --   --   --   --   --   TROPONINIHS  --  553*  --   --  337*  --   --   --   --    < > = values in this interval not displayed.    Estimated Creatinine Clearance: 12.7 mL/min (A) (by C-G formula based on SCr of 7.03 mg/dL (H)).   Medical History: Past Medical History:  Diagnosis Date   Abdominal abscess    a.) chronic MRSA infection   Acute pancreatitis    Anxiety    Asthma    Brain aneurysm    a.) 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 b.) RIGHT crainotomy for clip ligation 05/14/2019.   CHF (congestive heart failure) (HCC)    Chronic back pain    Chronic heart failure with preserved ejection fraction (HFpEF) (HCC)    a. 11/2021 Echo: EF 55-60%, no rwma, mod LVH, GrI DD. Nl RV size/fxn. Mildly dil LA.   CKD (chronic kidney disease), stage V (HCC)    Depression    Emphysema of lung (HCC)    Erectile dysfunction    Followed by palliative care service    GERD (gastroesophageal reflux disease)    History of methicillin resistant staphylococcus aureus (MRSA)    HLD (hyperlipidemia)    Hypertension    Mild cognitive impairment    MRSA (methicillin resistant Staphylococcus  aureus)    Obesity    OSA (obstructive sleep apnea) 2013   a.) not compliant with nocturnal PAP therapy; CPAP machine "lost or stolen".   Panic disorder    Perforated bowel (HCC) 12/10/2005   Tempoary Colostomy Bag, Skin Graft for Abd wound   Polysubstance abuse (HCC)    a.) ETOH, tobacco, marijuana, methamphetamines, cocaine, BZO, opioids.   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   T2DM (type 2 diabetes mellitus) (HCC)    Tobacco abuse    Type 2 diabetes mellitus without complication, without long-term current  use of insulin (HCC) 04/17/2016   Vitreous hemorrhage (HCC) 03/27/2011   Overview:  Bilateral; 01/2010, from [facility]     Assessment: Pt is a 57 yo male w/ ESRD on HD presenting to ED c/o weakness, SOB, & "seeing numbers"  found w/ elevated Troponin I level.  Pt reports he has not been to dialysis for 1 wk.  1019 0916 HL < 0.1, subthera; 1150 un/hr. Patient pulled out IV. Drip was off 1019 1806 HL 0.13, subtherapeutic; 1150 un/hr 10/20 0718 HL 0.35  10/20 1449 HL 0.21  Goal of Therapy:  Heparin level 0.3-0.7 units/ml Monitor platelets by anticoagulation protocol: Yes   Plan: Heparin level is subtherapeutic. Heparin bolus 1300 units and increase heparin infusion to 1600 units/hr Heparin level in 8 hours. CBC daily while on heparin.   Jaynie Bream, PharmD, BCPS Clinical Pharmacist 09/29/2023 3:49 PM

## 2023-09-29 NOTE — Progress Notes (Signed)
PROGRESS NOTE    Derek Cooley.  NWG:[id number] DOB: 11-30-1966 DOA: 09/27/2023 PCP: Larae Grooms, NP    Brief Narrative:   57 year old male history significant for ESRD on HD, CHF, hypertension, hyperlipidemia, chronic hypoxic respiratory failure who presents to the ED with progressively worsening dyspnea associated with cough and wheeze   Patient has a long history of medication and treatment nonadherence.  Frequently misses dialysis.  Patient states he missed his last 3 dialysis sessions.  He is markedly hypertensive with systolics greater than 200.  Currently on nitroglycerin drip.  10/20: Blood pressure improved.  Weaned off nitroglycerin drip.  Stable for transfer to general medical service.   Assessment & Plan:   Principal Problem:   NSTEMI (non-ST elevated myocardial infarction) (HCC) Active Problems:   End-stage renal disease on hemodialysis (HCC)   Essential hypertension   Hypertensive emergency   Type 2 diabetes mellitus with chronic kidney disease, without long-term current use of insulin (HCC)   Heart failure with preserved ejection fraction (HCC)   GERD without esophagitis   Anxiety and depression   Smoking   NSVT (nonsustained ventricular tachycardia) (HCC)   Hypertensive urgency  ESRD on HD Hypertensive urgency Patient initially required admission to stepdown unit and nitroglycerin gtt.  Received stat dialysis on 10/19.  Blood pressure control improved.  Restarted on home medications. Plan: Transfer to MedSurg Amlodipine Losartan IV hydralazine as needed Nephrology following for HD  Elevated troponin Suspect supply/demand ischemia in the setting of hypertensive urgency and ESRD Echo normal ejection fraction Plan: Heparin drip x 48 hours No plans for ischemic evaluation  Acute heart failure with preserved ejection fraction In the setting of multiple missed dialysis sessions Volume management with dialysis  NSVT Episode of NSVT lasting 31  beats Overall asymptomatic Plan: Continue telemetry Replete potassium  Type 2 diabetes mellitus with CKD Last hemoglobin A1c 5.5 in July 2024 No indication for carb modified diet No indication for CBGs  Chronic hypoxic respiratory failure Patient on baseline 3 L oxygen  History of drug use UDS     DVT prophylaxis: IV heparin Code Status: Full Family Communication:None Disposition Plan: Status is: Inpatient Remains inpatient appropriate because: Multiple acute issues as above   Level of care: Telemetry Medical  Consultants:  Nephrology Cardiology  Procedures:  None  Antimicrobials: None   Subjective: Seen and examined.  In better spirits today.  No complaints  Objective: Vitals:   09/29/23 1131 09/29/23 1200 09/29/23 1300 09/29/23 1400  BP: (!) 148/55 (!) 168/79 (!) 170/80 95/68  Pulse: 83 73 77 72  Resp:  17 17 17   Temp:      TempSrc:      SpO2:  96% 97% 96%  Weight:        Intake/Output Summary (Last 24 hours) at 09/29/2023 1705 Last data filed at 09/29/2023 1400 Gross per 24 hour  Intake 1437.94 ml  Output 291 ml  Net 1146.94 ml   Filed Weights   09/28/23 0844 09/28/23 1305  Weight: 96.8 kg 94.8 kg    Examination:  General exam: Appears chronically ill Respiratory system: Clear to auscultation. Respiratory effort normal. Cardiovascular system: S1-S2, RRR, no murmurs, no pedal edema Gastrointestinal system: Soft, NT/ND, normal bowel sounds Central nervous system: Alert and oriented. No focal neurological deficits. Extremities: Symmetric 5 x 5 power. Skin: No rashes, lesions or ulcers Psychiatry: Judgement and insight appear normal. Mood & affect appropriate.     Data Reviewed: I have personally reviewed following labs and imaging studies  PROGRESS NOTE    Derek Cooley.  NWG:[id number] DOB: 11-30-1966 DOA: 09/27/2023 PCP: Larae Grooms, NP    Brief Narrative:   57 year old male history significant for ESRD on HD, CHF, hypertension, hyperlipidemia, chronic hypoxic respiratory failure who presents to the ED with progressively worsening dyspnea associated with cough and wheeze   Patient has a long history of medication and treatment nonadherence.  Frequently misses dialysis.  Patient states he missed his last 3 dialysis sessions.  He is markedly hypertensive with systolics greater than 200.  Currently on nitroglycerin drip.  10/20: Blood pressure improved.  Weaned off nitroglycerin drip.  Stable for transfer to general medical service.   Assessment & Plan:   Principal Problem:   NSTEMI (non-ST elevated myocardial infarction) (HCC) Active Problems:   End-stage renal disease on hemodialysis (HCC)   Essential hypertension   Hypertensive emergency   Type 2 diabetes mellitus with chronic kidney disease, without long-term current use of insulin (HCC)   Heart failure with preserved ejection fraction (HCC)   GERD without esophagitis   Anxiety and depression   Smoking   NSVT (nonsustained ventricular tachycardia) (HCC)   Hypertensive urgency  ESRD on HD Hypertensive urgency Patient initially required admission to stepdown unit and nitroglycerin gtt.  Received stat dialysis on 10/19.  Blood pressure control improved.  Restarted on home medications. Plan: Transfer to MedSurg Amlodipine Losartan IV hydralazine as needed Nephrology following for HD  Elevated troponin Suspect supply/demand ischemia in the setting of hypertensive urgency and ESRD Echo normal ejection fraction Plan: Heparin drip x 48 hours No plans for ischemic evaluation  Acute heart failure with preserved ejection fraction In the setting of multiple missed dialysis sessions Volume management with dialysis  NSVT Episode of NSVT lasting 31  beats Overall asymptomatic Plan: Continue telemetry Replete potassium  Type 2 diabetes mellitus with CKD Last hemoglobin A1c 5.5 in July 2024 No indication for carb modified diet No indication for CBGs  Chronic hypoxic respiratory failure Patient on baseline 3 L oxygen  History of drug use UDS     DVT prophylaxis: IV heparin Code Status: Full Family Communication:None Disposition Plan: Status is: Inpatient Remains inpatient appropriate because: Multiple acute issues as above   Level of care: Telemetry Medical  Consultants:  Nephrology Cardiology  Procedures:  None  Antimicrobials: None   Subjective: Seen and examined.  In better spirits today.  No complaints  Objective: Vitals:   09/29/23 1131 09/29/23 1200 09/29/23 1300 09/29/23 1400  BP: (!) 148/55 (!) 168/79 (!) 170/80 95/68  Pulse: 83 73 77 72  Resp:  17 17 17   Temp:      TempSrc:      SpO2:  96% 97% 96%  Weight:        Intake/Output Summary (Last 24 hours) at 09/29/2023 1705 Last data filed at 09/29/2023 1400 Gross per 24 hour  Intake 1437.94 ml  Output 291 ml  Net 1146.94 ml   Filed Weights   09/28/23 0844 09/28/23 1305  Weight: 96.8 kg 94.8 kg    Examination:  General exam: Appears chronically ill Respiratory system: Clear to auscultation. Respiratory effort normal. Cardiovascular system: S1-S2, RRR, no murmurs, no pedal edema Gastrointestinal system: Soft, NT/ND, normal bowel sounds Central nervous system: Alert and oriented. No focal neurological deficits. Extremities: Symmetric 5 x 5 power. Skin: No rashes, lesions or ulcers Psychiatry: Judgement and insight appear normal. Mood & affect appropriate.     Data Reviewed: I have personally reviewed following labs and imaging studies  CBC: Recent Labs  Lab 09/27/23 2036 09/28/23 0725 09/29/23 0718  WBC 8.3 8.4 13.0*  HGB 10.0* 9.5* 9.5*  HCT 30.6* 29.2* 28.6*  MCV 99.4 98.0 95.0  PLT 201 205 203   Basic Metabolic  Panel: Recent Labs  Lab 09/27/23 2036 09/28/23 0725 09/29/23 0718  NA 142 139  --   K 4.0 4.4 3.2*  CL 106 104  --   CO2 24 22  --   GLUCOSE 138* 198*  --   BUN 96* 95*  --   CREATININE 6.93* 7.03*  --   CALCIUM 8.1* 8.0*  --   MG  --   --  2.0   GFR: Estimated Creatinine Clearance: 12.7 mL/min (A) (by C-G formula based on SCr of 7.03 mg/dL (H)). Liver Function Tests: Recent Labs  Lab 09/27/23 2251  AST 19  ALT 23  ALKPHOS 82  BILITOT 1.0  PROT 6.8  ALBUMIN 3.6   No results for input(s): "LIPASE", "AMYLASE" in the last 168 hours. No results for input(s): "AMMONIA" in the last 168 hours. Coagulation Profile: Recent Labs  Lab 09/28/23 0045  INR 1.2   Cardiac Enzymes: No results for input(s): "CKTOTAL", "CKMB", "CKMBINDEX", "TROPONINI" in the last 168 hours. BNP (last 3 results) No results for input(s): "PROBNP" in the last 8760 hours. HbA1C: No results for input(s): "HGBA1C" in the last 72 hours. CBG: Recent Labs  Lab 09/28/23 1337 09/28/23 1645 09/28/23 2157 09/29/23 0716 09/29/23 1112  GLUCAP 167* 115* 140* 153* 115*   Lipid Profile: Recent Labs    09/28/23 0725  CHOL 149  HDL 51  LDLCALC 84  TRIG 69  CHOLHDL 2.9   Thyroid Function Tests: No results for input(s): "TSH", "T4TOTAL", "FREET4", "T3FREE", "THYROIDAB" in the last 72 hours. Anemia Panel: No results for input(s): "VITAMINB12", "FOLATE", "FERRITIN", "TIBC", "IRON", "RETICCTPCT" in the last 72 hours. Sepsis Labs: No results for input(s): "PROCALCITON", "LATICACIDVEN" in the last 168 hours.  Recent Results (from the past 240 hour(s))  MRSA Next Gen by PCR, Nasal     Status: None   Collection Time: 09/28/23  8:43 AM   Specimen: Nasal Mucosa; Nasal Swab  Result Value Ref Range Status   MRSA by PCR Next Gen NOT DETECTED NOT DETECTED Final    Comment: (NOTE) The GeneXpert MRSA Assay (FDA approved for NASAL specimens only), is one component of a comprehensive MRSA colonization  surveillance program. It is not intended to diagnose MRSA infection nor to guide or monitor treatment for MRSA infections. Test performance is not FDA approved in patients less than 65 years old. Performed at [facility], [address], [place], [place] [address]          Radiology Studies: ECHOCARDIOGRAM COMPLETE  Result Date: 09/29/2023    ECHOCARDIOGRAM REPORT   Patient Name:   Derek Cooley. Date of Exam: 09/29/2023 Medical Rec #:  [id number]          Height:       67.0 in Accession #:    [id number]         Weight:       209.0 lb Date of Birth:  1966-01-26         BSA:          2.060 m Patient Age:    56 years           BP:           214/100 mmHg Patient Gender: M  CBC: Recent Labs  Lab 09/27/23 2036 09/28/23 0725 09/29/23 0718  WBC 8.3 8.4 13.0*  HGB 10.0* 9.5* 9.5*  HCT 30.6* 29.2* 28.6*  MCV 99.4 98.0 95.0  PLT 201 205 203   Basic Metabolic  Panel: Recent Labs  Lab 09/27/23 2036 09/28/23 0725 09/29/23 0718  NA 142 139  --   K 4.0 4.4 3.2*  CL 106 104  --   CO2 24 22  --   GLUCOSE 138* 198*  --   BUN 96* 95*  --   CREATININE 6.93* 7.03*  --   CALCIUM 8.1* 8.0*  --   MG  --   --  2.0   GFR: Estimated Creatinine Clearance: 12.7 mL/min (A) (by C-G formula based on SCr of 7.03 mg/dL (H)). Liver Function Tests: Recent Labs  Lab 09/27/23 2251  AST 19  ALT 23  ALKPHOS 82  BILITOT 1.0  PROT 6.8  ALBUMIN 3.6   No results for input(s): "LIPASE", "AMYLASE" in the last 168 hours. No results for input(s): "AMMONIA" in the last 168 hours. Coagulation Profile: Recent Labs  Lab 09/28/23 0045  INR 1.2   Cardiac Enzymes: No results for input(s): "CKTOTAL", "CKMB", "CKMBINDEX", "TROPONINI" in the last 168 hours. BNP (last 3 results) No results for input(s): "PROBNP" in the last 8760 hours. HbA1C: No results for input(s): "HGBA1C" in the last 72 hours. CBG: Recent Labs  Lab 09/28/23 1337 09/28/23 1645 09/28/23 2157 09/29/23 0716 09/29/23 1112  GLUCAP 167* 115* 140* 153* 115*   Lipid Profile: Recent Labs    09/28/23 0725  CHOL 149  HDL 51  LDLCALC 84  TRIG 69  CHOLHDL 2.9   Thyroid Function Tests: No results for input(s): "TSH", "T4TOTAL", "FREET4", "T3FREE", "THYROIDAB" in the last 72 hours. Anemia Panel: No results for input(s): "VITAMINB12", "FOLATE", "FERRITIN", "TIBC", "IRON", "RETICCTPCT" in the last 72 hours. Sepsis Labs: No results for input(s): "PROCALCITON", "LATICACIDVEN" in the last 168 hours.  Recent Results (from the past 240 hour(s))  MRSA Next Gen by PCR, Nasal     Status: None   Collection Time: 09/28/23  8:43 AM   Specimen: Nasal Mucosa; Nasal Swab  Result Value Ref Range Status   MRSA by PCR Next Gen NOT DETECTED NOT DETECTED Final    Comment: (NOTE) The GeneXpert MRSA Assay (FDA approved for NASAL specimens only), is one component of a comprehensive MRSA colonization  surveillance program. It is not intended to diagnose MRSA infection nor to guide or monitor treatment for MRSA infections. Test performance is not FDA approved in patients less than 65 years old. Performed at [facility], [address], [place], [place] [address]          Radiology Studies: ECHOCARDIOGRAM COMPLETE  Result Date: 09/29/2023    ECHOCARDIOGRAM REPORT   Patient Name:   Derek Cooley. Date of Exam: 09/29/2023 Medical Rec #:  [id number]          Height:       67.0 in Accession #:    [id number]         Weight:       209.0 lb Date of Birth:  1966-01-26         BSA:          2.060 m Patient Age:    56 years           BP:           214/100 mmHg Patient Gender: M  CBC: Recent Labs  Lab 09/27/23 2036 09/28/23 0725 09/29/23 0718  WBC 8.3 8.4 13.0*  HGB 10.0* 9.5* 9.5*  HCT 30.6* 29.2* 28.6*  MCV 99.4 98.0 95.0  PLT 201 205 203   Basic Metabolic  Panel: Recent Labs  Lab 09/27/23 2036 09/28/23 0725 09/29/23 0718  NA 142 139  --   K 4.0 4.4 3.2*  CL 106 104  --   CO2 24 22  --   GLUCOSE 138* 198*  --   BUN 96* 95*  --   CREATININE 6.93* 7.03*  --   CALCIUM 8.1* 8.0*  --   MG  --   --  2.0   GFR: Estimated Creatinine Clearance: 12.7 mL/min (A) (by C-G formula based on SCr of 7.03 mg/dL (H)). Liver Function Tests: Recent Labs  Lab 09/27/23 2251  AST 19  ALT 23  ALKPHOS 82  BILITOT 1.0  PROT 6.8  ALBUMIN 3.6   No results for input(s): "LIPASE", "AMYLASE" in the last 168 hours. No results for input(s): "AMMONIA" in the last 168 hours. Coagulation Profile: Recent Labs  Lab 09/28/23 0045  INR 1.2   Cardiac Enzymes: No results for input(s): "CKTOTAL", "CKMB", "CKMBINDEX", "TROPONINI" in the last 168 hours. BNP (last 3 results) No results for input(s): "PROBNP" in the last 8760 hours. HbA1C: No results for input(s): "HGBA1C" in the last 72 hours. CBG: Recent Labs  Lab 09/28/23 1337 09/28/23 1645 09/28/23 2157 09/29/23 0716 09/29/23 1112  GLUCAP 167* 115* 140* 153* 115*   Lipid Profile: Recent Labs    09/28/23 0725  CHOL 149  HDL 51  LDLCALC 84  TRIG 69  CHOLHDL 2.9   Thyroid Function Tests: No results for input(s): "TSH", "T4TOTAL", "FREET4", "T3FREE", "THYROIDAB" in the last 72 hours. Anemia Panel: No results for input(s): "VITAMINB12", "FOLATE", "FERRITIN", "TIBC", "IRON", "RETICCTPCT" in the last 72 hours. Sepsis Labs: No results for input(s): "PROCALCITON", "LATICACIDVEN" in the last 168 hours.  Recent Results (from the past 240 hour(s))  MRSA Next Gen by PCR, Nasal     Status: None   Collection Time: 09/28/23  8:43 AM   Specimen: Nasal Mucosa; Nasal Swab  Result Value Ref Range Status   MRSA by PCR Next Gen NOT DETECTED NOT DETECTED Final    Comment: (NOTE) The GeneXpert MRSA Assay (FDA approved for NASAL specimens only), is one component of a comprehensive MRSA colonization  surveillance program. It is not intended to diagnose MRSA infection nor to guide or monitor treatment for MRSA infections. Test performance is not FDA approved in patients less than 65 years old. Performed at [facility], [address], [place], [place] [address]          Radiology Studies: ECHOCARDIOGRAM COMPLETE  Result Date: 09/29/2023    ECHOCARDIOGRAM REPORT   Patient Name:   Derek Cooley. Date of Exam: 09/29/2023 Medical Rec #:  [id number]          Height:       67.0 in Accession #:    [id number]         Weight:       209.0 lb Date of Birth:  1966-01-26         BSA:          2.060 m Patient Age:    56 years           BP:           214/100 mmHg Patient Gender: M  PROGRESS NOTE    Derek Cooley.  NWG:[id number] DOB: 11-30-1966 DOA: 09/27/2023 PCP: Larae Grooms, NP    Brief Narrative:   57 year old male history significant for ESRD on HD, CHF, hypertension, hyperlipidemia, chronic hypoxic respiratory failure who presents to the ED with progressively worsening dyspnea associated with cough and wheeze   Patient has a long history of medication and treatment nonadherence.  Frequently misses dialysis.  Patient states he missed his last 3 dialysis sessions.  He is markedly hypertensive with systolics greater than 200.  Currently on nitroglycerin drip.  10/20: Blood pressure improved.  Weaned off nitroglycerin drip.  Stable for transfer to general medical service.   Assessment & Plan:   Principal Problem:   NSTEMI (non-ST elevated myocardial infarction) (HCC) Active Problems:   End-stage renal disease on hemodialysis (HCC)   Essential hypertension   Hypertensive emergency   Type 2 diabetes mellitus with chronic kidney disease, without long-term current use of insulin (HCC)   Heart failure with preserved ejection fraction (HCC)   GERD without esophagitis   Anxiety and depression   Smoking   NSVT (nonsustained ventricular tachycardia) (HCC)   Hypertensive urgency  ESRD on HD Hypertensive urgency Patient initially required admission to stepdown unit and nitroglycerin gtt.  Received stat dialysis on 10/19.  Blood pressure control improved.  Restarted on home medications. Plan: Transfer to MedSurg Amlodipine Losartan IV hydralazine as needed Nephrology following for HD  Elevated troponin Suspect supply/demand ischemia in the setting of hypertensive urgency and ESRD Echo normal ejection fraction Plan: Heparin drip x 48 hours No plans for ischemic evaluation  Acute heart failure with preserved ejection fraction In the setting of multiple missed dialysis sessions Volume management with dialysis  NSVT Episode of NSVT lasting 31  beats Overall asymptomatic Plan: Continue telemetry Replete potassium  Type 2 diabetes mellitus with CKD Last hemoglobin A1c 5.5 in July 2024 No indication for carb modified diet No indication for CBGs  Chronic hypoxic respiratory failure Patient on baseline 3 L oxygen  History of drug use UDS     DVT prophylaxis: IV heparin Code Status: Full Family Communication:None Disposition Plan: Status is: Inpatient Remains inpatient appropriate because: Multiple acute issues as above   Level of care: Telemetry Medical  Consultants:  Nephrology Cardiology  Procedures:  None  Antimicrobials: None   Subjective: Seen and examined.  In better spirits today.  No complaints  Objective: Vitals:   09/29/23 1131 09/29/23 1200 09/29/23 1300 09/29/23 1400  BP: (!) 148/55 (!) 168/79 (!) 170/80 95/68  Pulse: 83 73 77 72  Resp:  17 17 17   Temp:      TempSrc:      SpO2:  96% 97% 96%  Weight:        Intake/Output Summary (Last 24 hours) at 09/29/2023 1705 Last data filed at 09/29/2023 1400 Gross per 24 hour  Intake 1437.94 ml  Output 291 ml  Net 1146.94 ml   Filed Weights   09/28/23 0844 09/28/23 1305  Weight: 96.8 kg 94.8 kg    Examination:  General exam: Appears chronically ill Respiratory system: Clear to auscultation. Respiratory effort normal. Cardiovascular system: S1-S2, RRR, no murmurs, no pedal edema Gastrointestinal system: Soft, NT/ND, normal bowel sounds Central nervous system: Alert and oriented. No focal neurological deficits. Extremities: Symmetric 5 x 5 power. Skin: No rashes, lesions or ulcers Psychiatry: Judgement and insight appear normal. Mood & affect appropriate.     Data Reviewed: I have personally reviewed following labs and imaging studies

## 2023-09-29 NOTE — Progress Notes (Signed)
Better day than yesterday. More alert and interactive. 1200 Sister in to visit.[id number] Report called to [facility] on 1 A 1545 Transferred via bed to room 153.

## 2023-09-30 DIAGNOSIS — I5033 Acute on chronic diastolic (congestive) heart failure: Secondary | ICD-10-CM

## 2023-09-30 DIAGNOSIS — I214 Non-ST elevation (NSTEMI) myocardial infarction: Secondary | ICD-10-CM | POA: Diagnosis not present

## 2023-09-30 LAB — RENAL FUNCTION PANEL
Albumin: 3.2 g/dL — ABNORMAL LOW (ref 3.5–5.0)
Anion gap: 10 (ref 5–15)
BUN: 73 mg/dL — ABNORMAL HIGH (ref 6–20)
CO2: 26 mmol/L (ref 22–32)
Calcium: 7.8 mg/dL — ABNORMAL LOW (ref 8.9–10.3)
Chloride: 97 mmol/L — ABNORMAL LOW (ref 98–111)
Creatinine, Ser: 5.6 mg/dL — ABNORMAL HIGH (ref 0.61–1.24)
GFR, Estimated: 11 mL/min — ABNORMAL LOW (ref >60)
Glucose, Bld: 92 mg/dL (ref 70–99)
Phosphorus: 6 mg/dL — ABNORMAL HIGH (ref 2.5–4.6)
Potassium: 4.2 mmol/L (ref 3.5–5.1)
Sodium: 133 mmol/L — ABNORMAL LOW (ref 135–145)

## 2023-09-30 LAB — CBC
HCT: 31.8 % — ABNORMAL LOW (ref 39.0–52.0)
Hemoglobin: 10.4 g/dL — ABNORMAL LOW (ref 13.0–17.0)
MCH: 31.4 pg (ref 26.0–34.0)
MCHC: 32.7 g/dL (ref 30.0–36.0)
MCV: 96.1 fL (ref 80.0–100.0)
Platelets: 204 10*3/uL (ref 150–400)
RBC: 3.31 MIL/uL — ABNORMAL LOW (ref 4.22–5.81)
RDW: 13.6 % (ref 11.5–15.5)
WBC: 10.2 10*3/uL (ref 4.0–10.5)
nRBC: 0 % (ref 0.0–0.2)

## 2023-09-30 LAB — HEPARIN LEVEL (UNFRACTIONATED): Heparin Unfractionated: 0.26 [IU]/mL — ABNORMAL LOW (ref 0.30–0.70)

## 2023-09-30 MED ORDER — HEPARIN SODIUM (PORCINE) 5000 UNIT/ML IJ SOLN
5000.0000 [IU] | Freq: Three times a day (TID) | INTRAMUSCULAR | Status: DC
Start: 2023-09-30 — End: 2023-10-03
  Administered 2023-09-30 – 2023-10-03 (×8): 5000 [IU] via SUBCUTANEOUS
  Filled 2023-09-30 (×9): qty 1

## 2023-09-30 MED ORDER — EZETIMIBE 10 MG PO TABS
10.0000 mg | ORAL_TABLET | Freq: Every day | ORAL | Status: DC
  Administered 2023-09-30 – 2023-10-03 (×4): 10 mg via ORAL
  Filled 2023-09-30 (×4): qty 1

## 2023-09-30 MED ORDER — LIDOCAINE 5 % EX PTCH
1.0000 | MEDICATED_PATCH | CUTANEOUS | Status: DC
  Administered 2023-09-30 – 2023-10-02 (×3): 1 via TRANSDERMAL
  Filled 2023-09-30 (×4): qty 1

## 2023-09-30 MED ORDER — OXYCODONE HCL 5 MG PO TABS
5.0000 mg | ORAL_TABLET | ORAL | Status: DC | PRN
  Administered 2023-10-01: 5 mg via ORAL
  Administered 2023-10-02: 10 mg via ORAL
  Filled 2023-09-30: qty 1
  Filled 2023-09-30: qty 2

## 2023-09-30 NOTE — Progress Notes (Signed)
Weight:       209.0 lb Date of Birth:  [birth year]         BSA:          2.060 m Patient Age:    56 years           BP:           214/100 mmHg Patient Gender: M                  HR:           77 bpm. Exam Location:  [facility] Procedure: 2D Echo, Cardiac Doppler, Color Doppler and Strain Analysis Indications:     Elevated Troponin  History:         Patient has prior history of Echocardiogram examinations. CHF;                  Risk Factors:Hypertension.  Sonographer:     Neysa Bonito Roar Referring Phys:  [id number] Debbe Odea Diagnosing Phys: Debbe Odea MD  Sonographer Comments: Global longitudinal strain was attempted. IMPRESSIONS  1. Left ventricular ejection fraction, by estimation, is 60 to 65%. Left ventricular ejection fraction by PLAX is 63 %. The left ventricle has normal function. The left ventricle has no regional wall motion abnormalities. There is mild left ventricular hypertrophy. Left ventricular diastolic parameters are consistent with Grade II diastolic dysfunction (pseudonormalization). The average left ventricular global longitudinal strain is -17.7 %. The global longitudinal strain is  normal.  2. Right ventricular systolic function is normal. The right ventricular size is normal.  3. Left atrial size was mildly dilated.  4. The mitral valve is normal in structure. Trivial mitral valve regurgitation.  5. The aortic valve is tricuspid. Aortic valve regurgitation is not visualized. Aortic valve sclerosis/calcification is present, without any evidence of aortic stenosis. FINDINGS  Left Ventricle: Left ventricular ejection fraction, by estimation, is 60 to 65%. Left ventricular ejection fraction by PLAX is 63 %. The left ventricle has normal function. The left ventricle has no regional wall motion abnormalities. The average left ventricular global longitudinal strain is -17.7 %. The global longitudinal strain is normal. The left ventricular internal cavity size was normal in size. There is mild left ventricular hypertrophy. Left ventricular diastolic parameters are consistent with Grade II diastolic dysfunction (pseudonormalization). Right Ventricle: The right ventricular size is normal. No increase in right ventricular wall thickness. Right ventricular systolic function is normal. Left Atrium: Left atrial size was mildly dilated. Right Atrium: Right atrial size was normal in size. Pericardium: There is no evidence of pericardial effusion. Mitral Valve: The mitral valve is normal in structure. Trivial mitral valve regurgitation. MV peak gradient, 7.0 mmHg. The mean mitral valve gradient is 3.0 mmHg. Tricuspid Valve: The tricuspid valve is not well visualized. Tricuspid valve regurgitation is mild. Aortic Valve: The aortic valve is tricuspid. Aortic valve regurgitation is not visualized. Aortic valve sclerosis/calcification is present, without any evidence of aortic stenosis. Aortic valve mean gradient measures 5.0 mmHg. Aortic valve peak gradient measures 11.7 mmHg. Aortic valve area, by VTI measures 3.30 cm. Pulmonic Valve: The pulmonic valve was normal in structure. Pulmonic valve regurgitation is  trivial. Aorta: The aortic root is normal in size and structure. Venous: The inferior vena cava was not well visualized. IAS/Shunts: No atrial level shunt detected by color flow Doppler.  LEFT VENTRICLE PLAX 2D LV EF:         Left            Diastology  Weight:       209.0 lb Date of Birth:  [birth year]         BSA:          2.060 m Patient Age:    56 years           BP:           214/100 mmHg Patient Gender: M                  HR:           77 bpm. Exam Location:  [facility] Procedure: 2D Echo, Cardiac Doppler, Color Doppler and Strain Analysis Indications:     Elevated Troponin  History:         Patient has prior history of Echocardiogram examinations. CHF;                  Risk Factors:Hypertension.  Sonographer:     Neysa Bonito Roar Referring Phys:  [id number] Debbe Odea Diagnosing Phys: Debbe Odea MD  Sonographer Comments: Global longitudinal strain was attempted. IMPRESSIONS  1. Left ventricular ejection fraction, by estimation, is 60 to 65%. Left ventricular ejection fraction by PLAX is 63 %. The left ventricle has normal function. The left ventricle has no regional wall motion abnormalities. There is mild left ventricular hypertrophy. Left ventricular diastolic parameters are consistent with Grade II diastolic dysfunction (pseudonormalization). The average left ventricular global longitudinal strain is -17.7 %. The global longitudinal strain is  normal.  2. Right ventricular systolic function is normal. The right ventricular size is normal.  3. Left atrial size was mildly dilated.  4. The mitral valve is normal in structure. Trivial mitral valve regurgitation.  5. The aortic valve is tricuspid. Aortic valve regurgitation is not visualized. Aortic valve sclerosis/calcification is present, without any evidence of aortic stenosis. FINDINGS  Left Ventricle: Left ventricular ejection fraction, by estimation, is 60 to 65%. Left ventricular ejection fraction by PLAX is 63 %. The left ventricle has normal function. The left ventricle has no regional wall motion abnormalities. The average left ventricular global longitudinal strain is -17.7 %. The global longitudinal strain is normal. The left ventricular internal cavity size was normal in size. There is mild left ventricular hypertrophy. Left ventricular diastolic parameters are consistent with Grade II diastolic dysfunction (pseudonormalization). Right Ventricle: The right ventricular size is normal. No increase in right ventricular wall thickness. Right ventricular systolic function is normal. Left Atrium: Left atrial size was mildly dilated. Right Atrium: Right atrial size was normal in size. Pericardium: There is no evidence of pericardial effusion. Mitral Valve: The mitral valve is normal in structure. Trivial mitral valve regurgitation. MV peak gradient, 7.0 mmHg. The mean mitral valve gradient is 3.0 mmHg. Tricuspid Valve: The tricuspid valve is not well visualized. Tricuspid valve regurgitation is mild. Aortic Valve: The aortic valve is tricuspid. Aortic valve regurgitation is not visualized. Aortic valve sclerosis/calcification is present, without any evidence of aortic stenosis. Aortic valve mean gradient measures 5.0 mmHg. Aortic valve peak gradient measures 11.7 mmHg. Aortic valve area, by VTI measures 3.30 cm. Pulmonic Valve: The pulmonic valve was normal in structure. Pulmonic valve regurgitation is  trivial. Aorta: The aortic root is normal in size and structure. Venous: The inferior vena cava was not well visualized. IAS/Shunts: No atrial level shunt detected by color flow Doppler.  LEFT VENTRICLE PLAX 2D LV EF:         Left            Diastology  PROGRESS NOTE    Derek Cooley.  WUJ:[id number] DOB: 09-Nov-1966 DOA: 09/27/2023 PCP: Larae Grooms, NP    Brief Narrative:   57 year old male history significant for ESRD on HD, CHF, hypertension, hyperlipidemia, chronic hypoxic respiratory failure who presents to the ED with progressively worsening dyspnea associated with cough and wheeze   Patient has a long history of medication and treatment nonadherence.  Frequently misses dialysis.  Patient states he missed his last 3 dialysis sessions.  He is markedly hypertensive with systolics greater than 200.  Currently on nitroglycerin drip.  10/20: Blood pressure improved.  Weaned off nitroglycerin drip.  Stable for transfer to general medical service.   Assessment & Plan:   Principal Problem:   NSTEMI (non-ST elevated myocardial infarction) (HCC) Active Problems:   End-stage renal disease on hemodialysis (HCC)   Essential hypertension   Hypertensive emergency   Type 2 diabetes mellitus with chronic kidney disease, without long-term current use of insulin (HCC)   Heart failure with preserved ejection fraction (HCC)   GERD without esophagitis   Anxiety and depression   Smoking   NSVT (nonsustained ventricular tachycardia) (HCC)   Hypertensive urgency  ESRD on HD Hypertensive urgency Patient initially required admission to stepdown unit and nitroglycerin gtt.  Received stat dialysis on 10/19.  Blood pressure control improved.  Restarted on home medications. Plan: Continue metoprolol, amlodipine, losartan IV hydralazine as needed Nephrology following for HD HD 10/21  Elevated troponin Suspect supply/demand ischemia in the setting of hypertensive urgency and ESRD Echo normal ejection fraction Plan: Heparin drip x 48 hours, completed No plans for ischemic evaluation EKG as needed chest pain  Acute heart failure with preserved ejection fraction In the setting of multiple missed dialysis sessions Volume management  with dialysis  NSVT Episode of NSVT lasting 31 beats Overall asymptomatic Plan: Continue telemetry Replete potassium as needed  Type 2 diabetes mellitus with CKD Last hemoglobin A1c 5.5 in July 2024 No indication for carb modified diet No indication for CBGs  Chronic hypoxic respiratory failure Patient on baseline 3 L oxygen  History of drug use UDS     DVT prophylaxis: SQ heparin Code Status: Full Family Communication:None Disposition Plan: Status is: Inpatient Remains inpatient appropriate because: Multiple acute issues as above.  Anticipate discharge 10/22   Level of care: Telemetry Medical  Consultants:  Nephrology Cardiology  Procedures:  None  Antimicrobials: None   Subjective: Seen and.  More confused this morning.  Objective: Vitals:   09/30/23 1230 09/30/23 1300 09/30/23 1320 09/30/23 1355  BP: (!) 162/82 136/76 (!) 173/87 (!) 162/81  Pulse: 64 71 67 70  Resp: 20 (!) 24 (!) 23 14  Temp:    97.6 F (36.4 C)  TempSrc:      SpO2: 94% 92% 94% 99%  Weight:        Intake/Output Summary (Last 24 hours) at 09/30/2023 1429 Last data filed at 09/30/2023 1415 Gross per 24 hour  Intake 1169.96 ml  Output 4480 ml  Net -3310.04 ml   Filed Weights   09/28/23 0844 09/28/23 1305 09/30/23 0749  Weight: 96.8 kg 94.8 kg 95.2 kg    Examination:  General exam: Appears chronically ill Respiratory system: Bibasilar crackles.  Normal work of breathing.  3 L Cardiovascular system: S1-S2, RRR, no murmurs, no pedal edema Gastrointestinal system: Soft, NT/ND, normal bowel sounds Central nervous system: Alert and oriented. No focal neurological deficits. Extremities: Symmetric 5 x 5 power. Skin: No rashes, lesions or ulcers Psychiatry: Judgement and insight  PROGRESS NOTE    Derek Cooley.  WUJ:[id number] DOB: 09-Nov-1966 DOA: 09/27/2023 PCP: Larae Grooms, NP    Brief Narrative:   57 year old male history significant for ESRD on HD, CHF, hypertension, hyperlipidemia, chronic hypoxic respiratory failure who presents to the ED with progressively worsening dyspnea associated with cough and wheeze   Patient has a long history of medication and treatment nonadherence.  Frequently misses dialysis.  Patient states he missed his last 3 dialysis sessions.  He is markedly hypertensive with systolics greater than 200.  Currently on nitroglycerin drip.  10/20: Blood pressure improved.  Weaned off nitroglycerin drip.  Stable for transfer to general medical service.   Assessment & Plan:   Principal Problem:   NSTEMI (non-ST elevated myocardial infarction) (HCC) Active Problems:   End-stage renal disease on hemodialysis (HCC)   Essential hypertension   Hypertensive emergency   Type 2 diabetes mellitus with chronic kidney disease, without long-term current use of insulin (HCC)   Heart failure with preserved ejection fraction (HCC)   GERD without esophagitis   Anxiety and depression   Smoking   NSVT (nonsustained ventricular tachycardia) (HCC)   Hypertensive urgency  ESRD on HD Hypertensive urgency Patient initially required admission to stepdown unit and nitroglycerin gtt.  Received stat dialysis on 10/19.  Blood pressure control improved.  Restarted on home medications. Plan: Continue metoprolol, amlodipine, losartan IV hydralazine as needed Nephrology following for HD HD 10/21  Elevated troponin Suspect supply/demand ischemia in the setting of hypertensive urgency and ESRD Echo normal ejection fraction Plan: Heparin drip x 48 hours, completed No plans for ischemic evaluation EKG as needed chest pain  Acute heart failure with preserved ejection fraction In the setting of multiple missed dialysis sessions Volume management  with dialysis  NSVT Episode of NSVT lasting 31 beats Overall asymptomatic Plan: Continue telemetry Replete potassium as needed  Type 2 diabetes mellitus with CKD Last hemoglobin A1c 5.5 in July 2024 No indication for carb modified diet No indication for CBGs  Chronic hypoxic respiratory failure Patient on baseline 3 L oxygen  History of drug use UDS     DVT prophylaxis: SQ heparin Code Status: Full Family Communication:None Disposition Plan: Status is: Inpatient Remains inpatient appropriate because: Multiple acute issues as above.  Anticipate discharge 10/22   Level of care: Telemetry Medical  Consultants:  Nephrology Cardiology  Procedures:  None  Antimicrobials: None   Subjective: Seen and.  More confused this morning.  Objective: Vitals:   09/30/23 1230 09/30/23 1300 09/30/23 1320 09/30/23 1355  BP: (!) 162/82 136/76 (!) 173/87 (!) 162/81  Pulse: 64 71 67 70  Resp: 20 (!) 24 (!) 23 14  Temp:    97.6 F (36.4 C)  TempSrc:      SpO2: 94% 92% 94% 99%  Weight:        Intake/Output Summary (Last 24 hours) at 09/30/2023 1429 Last data filed at 09/30/2023 1415 Gross per 24 hour  Intake 1169.96 ml  Output 4480 ml  Net -3310.04 ml   Filed Weights   09/28/23 0844 09/28/23 1305 09/30/23 0749  Weight: 96.8 kg 94.8 kg 95.2 kg    Examination:  General exam: Appears chronically ill Respiratory system: Bibasilar crackles.  Normal work of breathing.  3 L Cardiovascular system: S1-S2, RRR, no murmurs, no pedal edema Gastrointestinal system: Soft, NT/ND, normal bowel sounds Central nervous system: Alert and oriented. No focal neurological deficits. Extremities: Symmetric 5 x 5 power. Skin: No rashes, lesions or ulcers Psychiatry: Judgement and insight  Weight:       209.0 lb Date of Birth:  [birth year]         BSA:          2.060 m Patient Age:    56 years           BP:           214/100 mmHg Patient Gender: M                  HR:           77 bpm. Exam Location:  [facility] Procedure: 2D Echo, Cardiac Doppler, Color Doppler and Strain Analysis Indications:     Elevated Troponin  History:         Patient has prior history of Echocardiogram examinations. CHF;                  Risk Factors:Hypertension.  Sonographer:     Neysa Bonito Roar Referring Phys:  [id number] Debbe Odea Diagnosing Phys: Debbe Odea MD  Sonographer Comments: Global longitudinal strain was attempted. IMPRESSIONS  1. Left ventricular ejection fraction, by estimation, is 60 to 65%. Left ventricular ejection fraction by PLAX is 63 %. The left ventricle has normal function. The left ventricle has no regional wall motion abnormalities. There is mild left ventricular hypertrophy. Left ventricular diastolic parameters are consistent with Grade II diastolic dysfunction (pseudonormalization). The average left ventricular global longitudinal strain is -17.7 %. The global longitudinal strain is  normal.  2. Right ventricular systolic function is normal. The right ventricular size is normal.  3. Left atrial size was mildly dilated.  4. The mitral valve is normal in structure. Trivial mitral valve regurgitation.  5. The aortic valve is tricuspid. Aortic valve regurgitation is not visualized. Aortic valve sclerosis/calcification is present, without any evidence of aortic stenosis. FINDINGS  Left Ventricle: Left ventricular ejection fraction, by estimation, is 60 to 65%. Left ventricular ejection fraction by PLAX is 63 %. The left ventricle has normal function. The left ventricle has no regional wall motion abnormalities. The average left ventricular global longitudinal strain is -17.7 %. The global longitudinal strain is normal. The left ventricular internal cavity size was normal in size. There is mild left ventricular hypertrophy. Left ventricular diastolic parameters are consistent with Grade II diastolic dysfunction (pseudonormalization). Right Ventricle: The right ventricular size is normal. No increase in right ventricular wall thickness. Right ventricular systolic function is normal. Left Atrium: Left atrial size was mildly dilated. Right Atrium: Right atrial size was normal in size. Pericardium: There is no evidence of pericardial effusion. Mitral Valve: The mitral valve is normal in structure. Trivial mitral valve regurgitation. MV peak gradient, 7.0 mmHg. The mean mitral valve gradient is 3.0 mmHg. Tricuspid Valve: The tricuspid valve is not well visualized. Tricuspid valve regurgitation is mild. Aortic Valve: The aortic valve is tricuspid. Aortic valve regurgitation is not visualized. Aortic valve sclerosis/calcification is present, without any evidence of aortic stenosis. Aortic valve mean gradient measures 5.0 mmHg. Aortic valve peak gradient measures 11.7 mmHg. Aortic valve area, by VTI measures 3.30 cm. Pulmonic Valve: The pulmonic valve was normal in structure. Pulmonic valve regurgitation is  trivial. Aorta: The aortic root is normal in size and structure. Venous: The inferior vena cava was not well visualized. IAS/Shunts: No atrial level shunt detected by color flow Doppler.  LEFT VENTRICLE PLAX 2D LV EF:         Left            Diastology

## 2023-09-30 NOTE — Progress Notes (Addendum)
[facility]  PROGRESS NOTE   Subjective:   Patient seen and evaluated during dialysis   HEMODIALYSIS FLOWSHEET:  Blood Flow Rate (mL/min): 350 mL/min Arterial Pressure (mmHg): -219.79 mmHg Venous Pressure (mmHg): 287.46 mmHg TMP (mmHg): 21.41 mmHg Ultrafiltration Rate (mL/min): 1603 mL/min Dialysate Flow Rate (mL/min): 299 ml/min  No complaints to offer Room air  Objective:  Vital signs: Blood pressure (!) 155/75, pulse 65, temperature 97.7 F (36.5 C), temperature source Oral, resp. rate (!) 21, weight 95.2 kg, SpO2 93%.  Intake/Output Summary (Last 24 hours) at 09/30/2023 1154 Last data filed at 09/30/2023 0600 Gross per 24 hour  Intake 1619.96 ml  Output 1121 ml  Net 498.96 ml   Filed Weights   09/28/23 0844 09/28/23 1305 09/30/23 0749  Weight: 96.8 kg 94.8 kg 95.2 kg     Physical Exam: General:  No acute distress  Head:  Normocephalic, atraumatic. Moist oral mucosal membranes  Eyes:  Anicteric  Neck:  Supple  Lungs:   Clear to auscultation, normal effort  Heart:  S1S2 no rubs  Abdomen:   Soft, nontender, bowel sounds present  Extremities:  peripheral edema.  Neurologic:  Awake, alert, following commands  Skin:  No lesions  Access: Lt AVF    Basic Metabolic Panel: Recent Labs  Lab 09/27/23 2036 09/28/23 0725 09/29/23 0718 09/30/23 0546  NA 142 139  --  133*  K 4.0 4.4 3.2* 4.2  CL 106 104  --  97*  CO2 24 22  --  26  GLUCOSE 138* 198*  --  92  BUN 96* 95*  --  73*  CREATININE 6.93* 7.03*  --  5.60*  CALCIUM 8.1* 8.0*  --  7.8*  MG  --   --  2.0  --   PHOS  --   --   --  6.0*   GFR: Estimated Creatinine Clearance: 16 mL/min (A) (by C-G formula based on SCr of 5.6 mg/dL (H)).  Liver Function Tests: Recent Labs  Lab 09/27/23 2251 09/30/23 0546  AST 19  --   ALT 23  --   ALKPHOS 82  --   BILITOT 1.0  --   PROT 6.8  --   ALBUMIN 3.6 3.2*   No results for input(s): "LIPASE", "AMYLASE" in the last 168 hours. No results for  input(s): "AMMONIA" in the last 168 hours.  CBC: Recent Labs  Lab 09/27/23 2036 09/28/23 0725 09/29/23 0718 09/30/23 0546  WBC 8.3 8.4 13.0* 10.2  HGB 10.0* 9.5* 9.5* 10.4*  HCT 30.6* 29.2* 28.6* 31.8*  MCV 99.4 98.0 95.0 96.1  PLT 201 205 203 204     HbA1C: HB A1C (BAYER DCA - WAIVED)  Date/Time Value Ref Range Status  04/17/2016 08:25 AM 5.6 <7.0 % Final    Comment:                                          Diabetic Adult            <7.0                                       Healthy Adult        4.3 - 5.7                                                           (  DCCT/NGSP) American Diabetes Association's Summary of Glycemic Recommendations for Adults with Diabetes: Hemoglobin A1c <7.0%. More stringent glycemic goals (A1c <6.0%) may further reduce complications at the cost of increased risk of hypoglycemia.    Hgb A1c MFr Bld  Date/Time Value Ref Range Status  06/10/2023 02:31 PM 5.5 4.8 - 5.6 % Final    Comment:             Prediabetes: 5.7 - 6.4          Diabetes: >6.4          Glycemic control for adults with diabetes: <7.0   01/07/2023 04:10 PM 6.3 (H) 4.8 - 5.6 % Final    Comment:             Prediabetes: 5.7 - 6.4          Diabetes: >6.4          Glycemic control for adults with diabetes: <7.0     Urinalysis: No results for input(s): "COLORURINE", "LABSPEC", "PHURINE", "GLUCOSEU", "HGBUR", "BILIRUBINUR", "KETONESUR", "PROTEINUR", "UROBILINOGEN", "NITRITE", "LEUKOCYTESUR" in the last 72 hours.  Invalid input(s): "APPERANCEUR"    Imaging: ECHOCARDIOGRAM COMPLETE  Result Date: 09/29/2023    ECHOCARDIOGRAM REPORT   Patient Name:   Derek Cooley. Date of Exam: 09/29/2023 Medical Rec #:  [id number]          Height:       67.0 in Accession #:    [id number]         Weight:       209.0 lb Date of Birth:  Mar 09, 1966         BSA:          2.060 m Patient Age:    56 years           BP:           214/100 mmHg Patient Gender: M                  HR:           77  bpm. Exam Location:  [facility] Procedure: 2D Echo, Cardiac Doppler, Color Doppler and Strain Analysis Indications:     Elevated Troponin  History:         Patient has prior history of Echocardiogram examinations. CHF;                  Risk Factors:Hypertension.  Sonographer:     Neysa Bonito Roar Referring Phys:  [id number] Debbe Odea Diagnosing Phys: Debbe Odea MD  Sonographer Comments: Global longitudinal strain was attempted. IMPRESSIONS  1. Left ventricular ejection fraction, by estimation, is 60 to 65%. Left ventricular ejection fraction by PLAX is 63 %. The left ventricle has normal function. The left ventricle has no regional wall motion abnormalities. There is mild left ventricular hypertrophy. Left ventricular diastolic parameters are consistent with Grade II diastolic dysfunction (pseudonormalization). The average left ventricular global longitudinal strain is -17.7 %. The global longitudinal strain is normal.  2. Right ventricular systolic function is normal. The right ventricular size is normal.  3. Left atrial size was mildly dilated.  4. The mitral valve is normal in structure. Trivial mitral valve regurgitation.  5. The aortic valve is tricuspid. Aortic valve regurgitation is not visualized. Aortic valve sclerosis/calcification is present, without any evidence of aortic stenosis. FINDINGS  Left Ventricle: Left ventricular ejection fraction, by estimation, is 60 to 65%. Left ventricular ejection fraction by PLAX is 63 %. The left ventricle has normal function. The  left ventricle has no regional wall motion abnormalities. The average left ventricular global longitudinal strain is -17.7 %. The global longitudinal strain is normal. The left ventricular internal cavity size was normal in size. There is mild left ventricular hypertrophy. Left ventricular diastolic parameters are consistent with Grade II diastolic dysfunction (pseudonormalization). Right Ventricle: The right ventricular size is normal. No  increase in right ventricular wall thickness. Right ventricular systolic function is normal. Left Atrium: Left atrial size was mildly dilated. Right Atrium: Right atrial size was normal in size. Pericardium: There is no evidence of pericardial effusion. Mitral Valve: The mitral valve is normal in structure. Trivial mitral valve regurgitation. MV peak gradient, 7.0 mmHg. The mean mitral valve gradient is 3.0 mmHg. Tricuspid Valve: The tricuspid valve is not well visualized. Tricuspid valve regurgitation is mild. Aortic Valve: The aortic valve is tricuspid. Aortic valve regurgitation is not visualized. Aortic valve sclerosis/calcification is present, without any evidence of aortic stenosis. Aortic valve mean gradient measures 5.0 mmHg. Aortic valve peak gradient measures 11.7 mmHg. Aortic valve area, by VTI measures 3.30 cm. Pulmonic Valve: The pulmonic valve was normal in structure. Pulmonic valve regurgitation is trivial. Aorta: The aortic root is normal in size and structure. Venous: The inferior vena cava was not well visualized. IAS/Shunts: No atrial level shunt detected by color flow Doppler.  LEFT VENTRICLE PLAX 2D LV EF:         Left            Diastology                ventricular     LV e' medial:    6.42 cm/s                ejection        LV E/e' medial:  15.7                fraction by     LV e' lateral:   6.85 cm/s                PLAX is 63      LV E/e' lateral: 14.7                %. LVIDd:         5.00 cm         2D LVIDs:         3.30 cm         Longitudinal LV PW:         1.30 cm         Strain LV IVS:        1.60 cm         2D Strain GLS  -17.7 % LVOT diam:     2.20 cm         Avg: LV SV:         81 LV SV Index:   39 LVOT Area:     3.80 cm  RIGHT VENTRICLE RV Basal diam:  3.50 cm RV Mid diam:    3.20 cm RV S prime:     19.50 cm/s TAPSE (M-mode): 2.7 cm LEFT ATRIUM             Index        RIGHT ATRIUM           Index LA diam:        4.70 cm 2.28 cm/m   RA Area:     15.40 cm LA  Vol (A2C):   51.5  ml 24.99 ml/m  RA Volume:   33.30 ml  16.16 ml/m LA Vol (A4C):   80.6 ml 39.12 ml/m LA Biplane Vol: 66.0 ml 32.03 ml/m  AORTIC VALVE                     PULMONIC VALVE AV Area (Vmax):    2.69 cm      PV Vmax:          0.95 m/s AV Area (Vmean):   3.13 cm      PV Peak grad:     3.6 mmHg AV Area (VTI):     3.30 cm      PR End Diast Vel: 7.62 msec AV Vmax:           171.00 cm/s   RVOT Peak grad:   2 mmHg AV Vmean:          104.000 cm/s AV VTI:            0.245 m AV Peak Grad:      11.7 mmHg AV Mean Grad:      5.0 mmHg LVOT Vmax:         121.00 cm/s LVOT Vmean:        85.600 cm/s LVOT VTI:          0.213 m LVOT/AV VTI ratio: 0.87  AORTA Ao Root diam: 2.90 cm Ao Asc diam:  3.70 cm MITRAL VALVE                TRICUSPID VALVE MV Area (PHT): 3.13 cm     TR Peak grad:   32.3 mmHg MV Area VTI:   2.69 cm     TR Vmax:        284.00 cm/s MV Peak grad:  7.0 mmHg MV Mean grad:  3.0 mmHg     SHUNTS MV Vmax:       1.32 m/s     Systemic VTI:  0.21 m MV Vmean:      78.7 cm/s    Systemic Diam: 2.20 cm MV Decel Time: 242 msec MV E velocity: 101.00 cm/s MV A velocity: 95.90 cm/s MV E/A ratio:  1.05 MV A Prime:    11.2 cm/s Debbe Odea MD Electronically signed by Debbe Odea MD Signature Date/Time: 09/29/2023/2:56:12 PM    Final      Medications:    anticoagulant sodium citrate      amLODipine  10 mg Oral Daily   aspirin EC  81 mg Oral Daily   carvedilol  6.25 mg Oral BID WC   Chlorhexidine Gluconate Cloth  6 each Topical Q0600   DULoxetine  60 mg Oral Daily   ezetimibe  10 mg Oral Daily   fluticasone furoate-vilanterol  1 puff Inhalation Daily   And   umeclidinium bromide  1 puff Inhalation Daily   heparin injection (subcutaneous)  5,000 Units Subcutaneous Q8H   losartan  100 mg Oral Daily   nicotine  21 mg Transdermal Daily   pantoprazole  40 mg Oral Daily   topiramate  50 mg Oral BID    Assessment/ Plan:     57 year old male with history of hypertension, coronary artery disease, congestive  heart failure, hyperlipidemia, peripheral vascular disease, diabetes, GERD, asthma, end-stage renal disease on dialysis was admitted through the emergency room with onset of dry cough, dyspnea associated with wheezing.  He had been on dialysis and has been noncompliant with his dialysis treatments.  He  missed 3 treatments as per the family.  He also had NSTEMI and is on heparin drip at this time.     ESRD: Receiving dialysis today, UF goal 3L as tolerated. Next treatment scheduled for Tuesday. Due to system clotting, will order heparinized circuit for next treatment.   ANEMIA: Hgb 10.4, no indication for ESA.    Secondary hyperparathyroidism. MBD: Phosphorus elevated, 6.0.    HTN with chronic kidney disease: Now on amlodipine, carvedilol and losartan.     Diabetes mellitus type II with chronic kidney disease/renal manifestations: noninsulin dependent.Glucose well controlled.      LOS: 3 [facility] 10/21/202411:54 AM

## 2023-09-30 NOTE — Plan of Care (Signed)
  Problem: Education: Goal: Understanding of cardiac disease, CV risk reduction, and recovery process will improve 09/30/2023 0649 by Cathren Harsh, RN Outcome: Progressing 09/30/2023 0649 by Cathren Harsh, RN Outcome: Progressing Goal: Individualized Educational Video(s) 09/30/2023 0649 by Cathren Harsh, RN Outcome: Progressing 09/30/2023 0649 by Cathren Harsh, RN Outcome: Progressing   Problem: Activity: Goal: Ability to tolerate increased activity will improve 09/30/2023 0649 by Cathren Harsh, RN Outcome: Progressing 09/30/2023 0649 by Cathren Harsh, RN Outcome: Progressing   Problem: Cardiac: Goal: Ability to achieve and maintain adequate cardiovascular perfusion will improve 09/30/2023 0649 by Cathren Harsh, RN Outcome: Progressing 09/30/2023 0649 by Cathren Harsh, RN Outcome: Progressing   Problem: Health Behavior/Discharge Planning: Goal: Ability to safely manage health-related needs after discharge will improve 09/30/2023 0649 by Cathren Harsh, RN Outcome: Progressing 09/30/2023 0649 by Cathren Harsh, RN Outcome: Progressing   Problem: Education: Goal: Ability to describe self-care measures that may prevent or decrease complications (Diabetes Survival Skills Education) will improve 09/30/2023 0649 by Cathren Harsh, RN Outcome: Progressing 09/30/2023 0649 by Cathren Harsh, RN Outcome: Progressing Goal: Individualized Educational Video(s) Outcome: Progressing

## 2023-09-30 NOTE — Progress Notes (Deleted)
ANTICOAGULATION CONSULT NOTE  Pharmacy Consult for Heparin Infusion Indication: ACS/STEMI  Patient Measurements: Weight: 94.8 kg (208 lb 15.9 oz) Heparin Dosing Weight: 86.1 kg  Labs: Recent Labs    09/27/23 2036 09/27/23 2251 09/28/23 0045 09/28/23 0725 09/28/23 0916 09/28/23 1806 09/29/23 0718 09/29/23 1449 09/29/23 2358  HGB 10.0*  --   --  9.5*  --   --  9.5*  --   --   HCT 30.6*  --   --  29.2*  --   --  28.6*  --   --   PLT 201  --   --  205  --   --  203  --   --   APTT  --   --  >200*  --   --   --   --   --   --   LABPROT  --   --  15.5*  --   --   --   --   --   --   INR  --   --  1.2  --   --   --   --   --   --   HEPARINUNFRC  --   --   --   --  <0.10*   < > 0.35 0.21* 0.26*  CREATININE 6.93*  --   --  7.03*  --   --   --   --   --   TROPONINIHS  --  553*  --   --  337*  --   --   --   --    < > = values in this interval not displayed.    Estimated Creatinine Clearance: 12.7 mL/min (A) (by C-G formula based on SCr of 7.03 mg/dL (H)).   Medical History: Past Medical History:  Diagnosis Date   Abdominal abscess    a.) chronic MRSA infection   Acute pancreatitis    Anxiety    Asthma    Brain aneurysm    a.) 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 b.) RIGHT crainotomy for clip ligation 05/14/2019.   CHF (congestive heart failure) (HCC)    Chronic back pain    Chronic heart failure with preserved ejection fraction (HFpEF) (HCC)    a. 11/2021 Echo: EF 55-60%, no rwma, mod LVH, GrI DD. Nl RV size/fxn. Mildly dil LA.   CKD (chronic kidney disease), stage V (HCC)    Depression    Emphysema of lung (HCC)    Erectile dysfunction    Followed by palliative care service    GERD (gastroesophageal reflux disease)    History of methicillin resistant staphylococcus aureus (MRSA)    HLD (hyperlipidemia)    Hypertension    Mild cognitive impairment    MRSA (methicillin resistant Staphylococcus  aureus)    Obesity    OSA (obstructive sleep apnea) 2013   a.) not compliant with nocturnal PAP therapy; CPAP machine "lost or stolen".   Panic disorder    Perforated bowel (HCC) 12/10/2005   Tempoary Colostomy Bag, Skin Graft for Abd wound   Polysubstance abuse (HCC)    a.) ETOH, tobacco, marijuana, methamphetamines, cocaine, BZO, opioids.   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   T2DM (type 2 diabetes mellitus) (HCC)    Tobacco abuse    Type 2 diabetes mellitus without complication, without long-term current  use of insulin (HCC) 04/17/2016   Vitreous hemorrhage (HCC) 03/27/2011   Overview:  Bilateral; 01/2010, from [facility]     Assessment: Pt is a 57 yo male w/ ESRD on HD presenting to ED c/o weakness, SOB, & "seeing numbers"  found w/ elevated Troponin I level.  Pt reports he has not been to dialysis for 1 wk.  1019 0916 HL < 0.1, subthera; 1150 un/hr. Patient pulled out IV. Drip was off 1019 1806 HL 0.13, subtherapeutic; 1150 un/hr 10/20 0718 HL 0.35  10/20 1449 HL 0.21 10/20  2358 HL 0.26  Goal of Therapy:  Heparin level 0.3-0.7 units/ml Monitor platelets by anticoagulation protocol: Yes   Plan: Heparin level is subtherapeutic. Heparin bolus 1300 units  Increase heparin infusion to 1750 units/hr Heparin level in 8 hours.  CBC daily while on heparin.   Otelia Sergeant, PharmD, MBA 09/30/2023 1:41 AM

## 2023-09-30 NOTE — Progress Notes (Addendum)
1020: Tx paused due to clotted set up. Awaiting new set up.  1038: Tx restarted.

## 2023-09-30 NOTE — Progress Notes (Signed)
Cardiology Progress Note   Patient Name: Derek Cooley. Date of Encounter: 09/30/2023  Primary Cardiologist: Lorine Bears, MD  Subjective   Currently receiving HD.  Groggy - unaware that he's @ HD.  C/o headache - chronic.  No chest pain or dyspnea.  Objective   Inpatient Medications    Scheduled Meds:  amLODipine  10 mg Oral Daily   aspirin EC  81 mg Oral Daily   carvedilol  6.25 mg Oral BID WC   Chlorhexidine Gluconate Cloth  6 each Topical Q0600   DULoxetine  60 mg Oral Daily   fluticasone furoate-vilanterol  1 puff Inhalation Daily   And   umeclidinium bromide  1 puff Inhalation Daily   losartan  100 mg Oral Daily   nicotine  21 mg Transdermal Daily   pantoprazole  40 mg Oral Daily   topiramate  50 mg Oral BID   Continuous Infusions:  anticoagulant sodium citrate     PRN Meds: acetaminophen, albuterol, ALPRAZolam, alteplase, anticoagulant sodium citrate, heparin, hydrALAZINE, lidocaine (PF), lidocaine-prilocaine, magnesium hydroxide, nitroGLYCERIN, ondansetron (ZOFRAN) IV, pentafluoroprop-tetrafluoroeth, traZODone   Vital Signs    Vitals:   09/29/23 2159 09/30/23 0749 09/30/23 0803 09/30/23 0830  BP: (!) 158/75 (!) 181/106 (!) 174/99 (!) 126/94  Pulse: 63 67 62 65  Resp: 16 15 19 20   Temp: (!) 97.4 F (36.3 C) 97.7 F (36.5 C)    TempSrc: Oral Oral    SpO2: 99% 100% 100% 99%  Weight:        Intake/Output Summary (Last 24 hours) at 09/30/2023 0847 Last data filed at 09/30/2023 0600 Gross per 24 hour  Intake 1619.96 ml  Output 1346 ml  Net 273.96 ml   Filed Weights   09/28/23 0844 09/28/23 1305  Weight: 96.8 kg 94.8 kg    Physical Exam   GEN: obese, in no acute distress.  HEENT: Grossly normal.  Neck: Supple, obese, difficult to gauge JVP.  No carotid bruits or masses. Cardiac: RRR, no murmurs, rubs, or gallops. No clubbing, cyanosis, edema.  Radials 2+, DP/PT 2+ and equal bilaterally.  Respiratory:  Respirations regular and unlabored,  scattered rhonchi and insp/exp wheezing throughout. GI: Obese, soft, nontender, nondistended, BS + x 4. MS: no deformity or atrophy. Skin: warm and dry, no rash. Neuro:  Strength and sensation are intact. Psych: AAOx3.  Normal affect.  Labs    Chemistry Recent Labs  Lab 09/27/23 2036 09/27/23 2251 09/28/23 0725 09/29/23 0718 09/30/23 0546  NA 142  --  139  --  133*  K 4.0  --  4.4 3.2* 4.2  CL 106  --  104  --  97*  CO2 24  --  22  --  26  GLUCOSE 138*  --  198*  --  92  BUN 96*  --  95*  --  73*  CREATININE 6.93*  --  7.03*  --  5.60*  CALCIUM 8.1*  --  8.0*  --  7.8*  PROT  --  6.8  --   --   --   ALBUMIN  --  3.6  --   --  3.2*  AST  --  19  --   --   --   ALT  --  23  --   --   --   ALKPHOS  --  82  --   --   --   BILITOT  --  1.0  --   --   --  GFRNONAA 9*  --  8*  --  11*  ANIONGAP 12  --  13  --  10     Hematology Recent Labs  Lab 09/28/23 0725 09/29/23 0718 09/30/23 0546  WBC 8.4 13.0* 10.2  RBC 2.98* 3.01* 3.31*  HGB 9.5* 9.5* 10.4*  HCT 29.2* 28.6* 31.8*  MCV 98.0 95.0 96.1  MCH 31.9 31.6 31.4  MCHC 32.5 33.2 32.7  RDW 14.0 13.7 13.6  PLT 205 203 204    Cardiac Enzymes  Recent Labs  Lab 09/16/23 0530 09/16/23 0715 09/27/23 2251 09/28/23 0916  TROPONINIHS 121* 107* 553* 337*      BNP    Component Value Date/Time   BNP 573.4 (H) 09/27/2023 2034   Lipids  Lab Results  Component Value Date   CHOL 149 09/28/2023   HDL 51 09/28/2023   LDLCALC 84 09/28/2023   TRIG 69 09/28/2023   CHOLHDL 2.9 09/28/2023    HbA1c  Lab Results  Component Value Date   HGBA1C 5.5 06/10/2023    Radiology    CT Head Wo Contrast  Result Date: 09/28/2023 CLINICAL DATA:  Mental status change, unknown cause Pt reports SHOB and feeling "off" for a few weeks. Pt reports he hasn't been to dialysis in 1 week. Pt reports he has been weak and "seeing numbers" Pt supposed to wear O2 but reports he ran out. EXAM: CT HEAD WITHOUT CONTRAST TECHNIQUE: Contiguous  axial images were obtained from the base of the skull through the vertex without intravenous contrast. RADIATION DOSE REDUCTION: This exam was performed according to the departmental dose-optimization program which includes automated exposure control, adjustment of the mA and/or kV according to patient size and/or use of iterative reconstruction technique. COMPARISON:  CT head 09/16/2023 FINDINGS: Brain: No evidence of large-territorial acute infarction. No parenchymal hemorrhage. No mass lesion. No extra-axial collection. No mass effect or midline shift. No hydrocephalus. Basilar cisterns are patent. Vascular: No hyperdense vessel. 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 Skull: No acute fracture or focal lesion.  Prior right craniotomy. Sinuses/Orbits: Bilateral maxillary sinus mucosal thickening. Otherwise paranasal sinuses and mastoid air cells are clear. The orbits are unremarkable. Other: None. IMPRESSION: No acute intracranial abnormality. Electronically Signed   By: Tish Frederickson M.D.   On: 09/28/2023 00:27   DG Chest Port 1 View  Result Date: 09/27/2023 CLINICAL DATA:  Shortness of breath EXAM: PORTABLE CHEST 1 VIEW COMPARISON:  09/16/2023 FINDINGS: Cardiac shadow is enlarged but stable. The lungs are well aerated bilaterally. No focal infiltrate or effusion is seen. No bony abnormality is noted. IMPRESSION: No acute abnormality noted. Electronically Signed   By: Alcide Clever M.D.   On: 09/27/2023 21:19      Telemetry    Seen in HD - sinus rhythm - Personally Reviewed  Cardiac Studies   2D Echocardiogram 10.20.2024  1. Left ventricular ejection fraction, by estimation, is 60 to 65%. Left  ventricular ejection fraction by PLAX is 63 %. The left ventricle has  normal function. The left ventricle has no regional wall motion  abnormalities. There is mild left ventricular  hypertrophy. Left ventricular diastolic parameters are consistent with  Grade II diastolic  dysfunction (pseudonormalization). The average left  ventricular global longitudinal strain is -17.7 %. The global longitudinal  strain is normal.   2. Right ventricular systolic function is normal. The right ventricular  size is normal.   3. Left atrial size was mildly dilated.   4. The mitral valve is normal  in structure. Trivial mitral valve  regurgitation.   5. The aortic valve is tricuspid. Aortic valve regurgitation is not  visualized. Aortic valve sclerosis/calcification is present, without any  evidence of aortic stenosis.  _____________   Patient Profile     57 y.o. male w a h/o noncompliance, ESRD on HD (TThS), chronic HFpEF, HTN, DMII, tob abuse, COPD, SAH s/p coil embolization in 2011), cerebral aneurysm s/p and clipping and ligation in 2020, chronic pain/headache, cognitive impairment, and prior cocaine use, who was admitted 10/18 w/ CHF in the setting of missing HD x 1 wk.  Assessment & Plan    1.  Acute on chronic HFpEF:  In setting of noncompliance w/ meds/HD.  Nl EF w/ GrII DD by echo this admission.  Currently receiving HD.  Volume status difficult to gauge due to body habitus, but does not appear grossly volume overloaded.  If accurate, current weight in line w/ historical weights.  BP elevated overnight, but currently better.  Follow post-HD.  Cont current doses of ? blocker, ARB, and CCB.  2.  HTN: As above, higher overnight but improved this AM in setting of HD.  Cont current doses of ? blocker, ARB, and CCB.  3.  Demand Ischemia/Elevated hsTrop/Cor Ca2+:  In setting of #1, HsTrop mildly elevated @ 121  107  553  337.  No chest pain.  Echo w/ nl EF and w/o rwma.  Has had prior presentations w/ hsTrop elevation, with low risk myoview in 06/2022 (mild cor Ca2+ noted at that time).  Cont conservative rx - stressed compliance w/ meds/HD.  Cont asa, ? blocker.  Statin intolerant.  LDL 84.  Will add zetia.  4.  ESRD:  On HD this AM. Per nephrology.  5.  Tob Abuse/COPD:   Still smoking.  Rhonchi and wheezing on exam.  Cessation advised.  Will need outpt f/u and counseling.  6.  Normocytic anemia:  stable.  Signed, Nicolasa Ducking, NP  09/30/2023, 8:47 AM    For questions or updates, please contact   Please consult [email or web address] for contact info under Cardiology/STEMI.

## 2023-09-30 NOTE — Progress Notes (Signed)
Hemodialysis note  Received patient in bed to unit. Alert and oriented.  Informed consent signed and in chart.  Treatment initiated: 0803 Treatment completed: 1320  Patient tolerated well. Transported back to room, alert without acute distress.  Report given to patient's RN.   Access used: Left arm AVF Access issues: none  Total UF removed: 3L Medication(s) given:  none Post HD weight: 93.6 kg   Derek Cooley [facility]

## 2023-09-30 NOTE — Plan of Care (Signed)
  Problem: Activity: Goal: Ability to tolerate increased activity will improve Outcome: Progressing   Problem: Fluid Volume: Goal: Ability to maintain a balanced intake and output will improve Outcome: Progressing   Problem: Nutritional: Goal: Maintenance of adequate nutrition will improve Outcome: Progressing   Problem: Skin Integrity: Goal: Risk for impaired skin integrity will decrease Outcome: Progressing   Problem: Clinical Measurements: Goal: Cardiovascular complication will be avoided Outcome: Progressing   Problem: Activity: Goal: Risk for activity intolerance will decrease Outcome: Progressing

## 2023-10-01 LAB — RENAL FUNCTION PANEL
Albumin: 3 g/dL — ABNORMAL LOW (ref 3.5–5.0)
Anion gap: 9 (ref 5–15)
BUN: 54 mg/dL — ABNORMAL HIGH (ref 6–20)
CO2: 27 mmol/L (ref 22–32)
Calcium: 7.8 mg/dL — ABNORMAL LOW (ref 8.9–10.3)
Chloride: 98 mmol/L (ref 98–111)
Creatinine, Ser: 4.53 mg/dL — ABNORMAL HIGH (ref 0.61–1.24)
GFR, Estimated: 14 mL/min — ABNORMAL LOW (ref >60)
Glucose, Bld: 93 mg/dL (ref 70–99)
Phosphorus: 4 mg/dL (ref 2.5–4.6)
Potassium: 3.8 mmol/L (ref 3.5–5.1)
Sodium: 134 mmol/L — ABNORMAL LOW (ref 135–145)

## 2023-10-01 MED ORDER — OXYCODONE HCL 5 MG PO TABS: 5.0000 mg | ORAL_TABLET | ORAL | 0 refills | Status: DC | PRN

## 2023-10-01 MED ORDER — LIDOCAINE-PRILOCAINE 2.5-2.5 % EX CREA: 1.0000 | TOPICAL_CREAM | CUTANEOUS | Status: DC | PRN

## 2023-10-01 MED ORDER — CARVEDILOL 6.25 MG PO TABS: 6.2500 mg | ORAL_TABLET | Freq: Two times a day (BID) | ORAL | 0 refills | Status: DC

## 2023-10-01 MED ORDER — PENTAFLUOROPROP-TETRAFLUOROETH EX AERO: 1.0000 | INHALATION_SPRAY | CUTANEOUS | Status: DC | PRN

## 2023-10-01 MED ORDER — HEPARIN SODIUM (PORCINE) 1000 UNIT/ML DIALYSIS: 1000.0000 [IU] | INTRAMUSCULAR | Status: DC | PRN

## 2023-10-01 MED ORDER — HEPARIN SODIUM (PORCINE) 1000 UNIT/ML DIALYSIS: 25.0000 [IU]/kg | INTRAMUSCULAR | Status: DC | PRN

## 2023-10-01 MED ORDER — EZETIMIBE 10 MG PO TABS: 10.0000 mg | ORAL_TABLET | Freq: Every day | ORAL | 0 refills | Status: AC

## 2023-10-01 MED ORDER — ASPIRIN 81 MG PO TBEC: 81.0000 mg | DELAYED_RELEASE_TABLET | Freq: Every day | ORAL | Status: DC

## 2023-10-01 MED ORDER — HEPARIN SODIUM (PORCINE) 1000 UNIT/ML IJ SOLN
INTRAMUSCULAR | Status: AC
  Filled 2023-10-01: qty 10

## 2023-10-01 MED ORDER — DULOXETINE HCL 60 MG PO CPEP: 60.0000 mg | ORAL_CAPSULE | Freq: Every day | ORAL | 0 refills | Status: AC

## 2023-10-01 NOTE — Progress Notes (Signed)
This RN has spoken with patient's sister three times today asking about the status of patient's ride home. Sister stated she had been trying to contact the person who usually takes patient home from hospital. She wanted to make sure the ride would have an oxygen tank available for patient. This RN last spoke with sister around 1800 and she was going to contact ride again. Sister stated she would call back with update but she has not. Patient stable in bed.

## 2023-10-01 NOTE — Progress Notes (Addendum)
PT Cancellation Note  Patient Details Name: Derek Cooley. MRN: [id number] DOB: 01/16/1966   Cancelled Treatment:    Reason Eval/Treat Not Completed: Patient at procedure or test/unavailable  Addendum: Per MD/RN pt with no PT needs, PT orders completed.    Olga Coaster PT, DPT 8:30 AM,10/01/23

## 2023-10-01 NOTE — Progress Notes (Signed)
Patient's sister called around 2223 to check on ride status. She was informed that patient is still discharged and awaiting a ride home. Per sister someone will be here around 9 or 10am for patient pickup. Patient currently sleeping.

## 2023-10-01 NOTE — Plan of Care (Signed)

## 2023-10-01 NOTE — Progress Notes (Signed)
OT Cancellation Note  Patient Details Name: Verden Donini. MRN: [id number] DOB: [birth year]   Cancelled Treatment:    Reason Eval/Treat Not Completed: Patient at procedure or test/ unavailable. Pt currently off the unit for HD. OT evaluation to re-attempt when pt is next available.   Jackquline Denmark, MS, OTR/L , [facility] ascom [phone]  10/01/23, 10:33 AM

## 2023-10-01 NOTE — Discharge Summary (Signed)
Physician Discharge Summary  Derek Cooley. KGU:[id number] DOB: 1966-05-28 DOA: 09/27/2023  PCP: Larae Grooms, NP  Admit date: 09/27/2023 Discharge date: 10/03/2023  Admitted From: Home Disposition:  Home  Recommendations for Outpatient Follow-up:  Follow up with PCP in 1-2 weeks Follow up with cardiology 2-4 weeks Follow up with nephrology as previously scheduled  Home Health:No Equipment/Devices:Oxygen 3l via [facility]   Discharge Condition: Stable CODE STATUS: Full Diet recommendation: Heart healthy with fluid restriction  Brief/Interim Summary:   57 year old male history significant for ESRD on HD, CHF, hypertension, hyperlipidemia, chronic hypoxic respiratory failure who presents to the ED with progressively worsening dyspnea associated with cough and wheeze   Patient has a long history of medication and treatment nonadherence.  Frequently misses dialysis.  Patient states he missed his last 3 dialysis sessions.  He is markedly hypertensive with systolics greater than 200.  Currently on nitroglycerin drip.   10/20: Blood pressure improved.  Weaned off nitroglycerin drip.  Stable for transfer to general medical service.  10/22: Dialyzed successfully on day of discharge.  Blood pressure improved.  Endorses pain in left shoulder secondary to chronic rotator cuff tear.  Stable for discharge home at this time.  Follow-up outpatient PCP, cardiology. Family however never came to get him  10/23: confused this morning and slurring speech, review of mar reveals he had both oxycodone and xanax this morning, prn meds he is not on at home. Confusion and slurred speech resolved several hours later so appears to be secondary to those meds. Respiratory status and BP remain stable, discharge orders placed.  10/24: family never came to get patient yesterday so was not discharged. Tolerated dialysis this morning and family has arrived to take him. BP well controlled.   Discharge Diagnoses:   Principal Problem:   NSTEMI (non-ST elevated myocardial infarction) (HCC) Active Problems:   Acute on chronic diastolic heart failure (HCC)   End-stage renal disease on hemodialysis (HCC)   Essential hypertension   Hypertensive emergency   Type 2 diabetes mellitus with chronic kidney disease, without long-term current use of insulin (HCC)   Heart failure with preserved ejection fraction (HCC)   GERD without esophagitis   Anxiety and depression   Smoking   NSVT (nonsustained ventricular tachycardia) (HCC)   Hypertensive urgency  ESRD on HD Hypertensive urgency Patient initially required admission to stepdown unit and nitroglycerin gtt.  Received stat dialysis on 10/19.  Blood pressure control improved.  Restarted on home medications. Plan: Continue carvedilol, amlodipine, losartan Ensure adherence to outpatient HD schedule  Elevated troponin Cardiology evaluated, think this supply/demand ischemia in the setting of hypertensive urgency and ESRD Echo normal ejection fraction Plan: Completed 48 hours of intravenous heparin.  At time of discharge recommend aspirin, beta-blocker, NT lipid agent.  Intolerant of statin.   Acute heart failure with preserved ejection fraction In the setting of multiple missed dialysis sessions Volume management with dialysis     Discharge Instructions  Discharge Instructions     Diet - low sodium heart healthy   Complete by: As directed    Diet - low sodium heart healthy   Complete by: As directed    Increase activity slowly   Complete by: As directed    Increase activity slowly   Complete by: As directed    No wound care   Complete by: As directed    No wound care   Complete by: As directed       Allergies as of 10/03/2023  Consultations: Cardiology Nephrology   Procedures/Studies: ECHOCARDIOGRAM COMPLETE  Result Date: 09/29/2023    ECHOCARDIOGRAM REPORT   Patient Name:   Derek Cooley. Date of Exam: 09/29/2023 Medical Rec #:  [id number]          Height:       67.0 in Accession #:    [id number]         Weight:       209.0 lb Date of Birth:  [birth year]         BSA:          2.060 m Patient Age:    56 years           BP:           214/100 mmHg Patient Gender: M                  HR:           77 bpm. Exam Location:  [facility] Procedure: 2D Echo, Cardiac Doppler, Color Doppler and Strain Analysis Indications:     Elevated Troponin  History:         Patient has prior history of Echocardiogram examinations. CHF;                  Risk Factors:Hypertension.  Sonographer:     Neysa Bonito Roar Referring Phys:  [id number] Debbe Odea Diagnosing Phys: Debbe Odea MD  Sonographer Comments: Global longitudinal strain was attempted. IMPRESSIONS  1. Left ventricular ejection fraction, by estimation, is 60 to  65%. Left ventricular ejection fraction by PLAX is 63 %. The left ventricle has normal function. The left ventricle has no regional wall motion abnormalities. There is mild left ventricular hypertrophy. Left ventricular diastolic parameters are consistent with Grade II diastolic dysfunction (pseudonormalization). The average left ventricular global longitudinal strain is -17.7 %. The global longitudinal strain is normal.  2. Right ventricular systolic function is normal. The right ventricular size is normal.  3. Left atrial size was mildly dilated.  4. The mitral valve is normal in structure. Trivial mitral valve regurgitation.  5. The aortic valve is tricuspid. Aortic valve regurgitation is not visualized. Aortic valve sclerosis/calcification is present, without any evidence of aortic stenosis. FINDINGS  Left Ventricle: Left ventricular ejection fraction, by estimation, is 60 to 65%. Left ventricular ejection fraction by PLAX is 63 %. The left ventricle has normal function. The left ventricle has no regional wall motion abnormalities. The average left ventricular global longitudinal strain is -17.7 %. The global longitudinal strain is normal. The left ventricular internal cavity size was normal in size. There is mild left ventricular hypertrophy. Left ventricular diastolic parameters are consistent with Grade II diastolic dysfunction (pseudonormalization). Right Ventricle: The right ventricular size is normal. No increase in right ventricular wall thickness. Right ventricular systolic function is normal. Left Atrium: Left atrial size was mildly dilated. Right Atrium: Right atrial size was normal in size. Pericardium: There is no evidence of pericardial effusion. Mitral Valve: The mitral valve is normal in structure. Trivial mitral valve regurgitation. MV peak gradient, 7.0 mmHg. The mean mitral valve gradient is 3.0 mmHg. Tricuspid Valve: The tricuspid valve is not well visualized. Tricuspid valve regurgitation is  mild. Aortic Valve: The aortic valve is tricuspid. Aortic valve regurgitation is not visualized. Aortic valve sclerosis/calcification is present, without any evidence of aortic stenosis. Aortic valve mean gradient measures 5.0 mmHg. Aortic valve peak gradient measures 11.7 mmHg. Aortic valve area, by VTI measures 3.30 cm. Pulmonic Valve: The pulmonic  Physician Discharge Summary  Derek Cooley. KGU:[id number] DOB: 1966-05-28 DOA: 09/27/2023  PCP: Larae Grooms, NP  Admit date: 09/27/2023 Discharge date: 10/03/2023  Admitted From: Home Disposition:  Home  Recommendations for Outpatient Follow-up:  Follow up with PCP in 1-2 weeks Follow up with cardiology 2-4 weeks Follow up with nephrology as previously scheduled  Home Health:No Equipment/Devices:Oxygen 3l via [place]   Discharge Condition: Stable CODE STATUS: Full Diet recommendation: Heart healthy with fluid restriction  Brief/Interim Summary:   57 year old male history significant for ESRD on HD, CHF, hypertension, hyperlipidemia, chronic hypoxic respiratory failure who presents to the ED with progressively worsening dyspnea associated with cough and wheeze   Patient has a long history of medication and treatment nonadherence.  Frequently misses dialysis.  Patient states he missed his last 3 dialysis sessions.  He is markedly hypertensive with systolics greater than 200.  Currently on nitroglycerin drip.   10/20: Blood pressure improved.  Weaned off nitroglycerin drip.  Stable for transfer to general medical service.  10/22: Dialyzed successfully on day of discharge.  Blood pressure improved.  Endorses pain in left shoulder secondary to chronic rotator cuff tear.  Stable for discharge home at this time.  Follow-up outpatient PCP, cardiology. Family however never came to get him  10/23: confused this morning and slurring speech, review of mar reveals he had both oxycodone and xanax this morning, prn meds he is not on at home. Confusion and slurred speech resolved several hours later so appears to be secondary to those meds. Respiratory status and BP remain stable, discharge orders placed.  10/24: family never came to get patient yesterday so was not discharged. Tolerated dialysis this morning and family has arrived to take him. BP well controlled.   Discharge Diagnoses:   Principal Problem:   NSTEMI (non-ST elevated myocardial infarction) (HCC) Active Problems:   Acute on chronic diastolic heart failure (HCC)   End-stage renal disease on hemodialysis (HCC)   Essential hypertension   Hypertensive emergency   Type 2 diabetes mellitus with chronic kidney disease, without long-term current use of insulin (HCC)   Heart failure with preserved ejection fraction (HCC)   GERD without esophagitis   Anxiety and depression   Smoking   NSVT (nonsustained ventricular tachycardia) (HCC)   Hypertensive urgency  ESRD on HD Hypertensive urgency Patient initially required admission to stepdown unit and nitroglycerin gtt.  Received stat dialysis on 10/19.  Blood pressure control improved.  Restarted on home medications. Plan: Continue carvedilol, amlodipine, losartan Ensure adherence to outpatient HD schedule  Elevated troponin Cardiology evaluated, think this supply/demand ischemia in the setting of hypertensive urgency and ESRD Echo normal ejection fraction Plan: Completed 48 hours of intravenous heparin.  At time of discharge recommend aspirin, beta-blocker, NT lipid agent.  Intolerant of statin.   Acute heart failure with preserved ejection fraction In the setting of multiple missed dialysis sessions Volume management with dialysis     Discharge Instructions  Discharge Instructions     Diet - low sodium heart healthy   Complete by: As directed    Diet - low sodium heart healthy   Complete by: As directed    Increase activity slowly   Complete by: As directed    Increase activity slowly   Complete by: As directed    No wound care   Complete by: As directed    No wound care   Complete by: As directed       Allergies as of 10/03/2023  Consultations: Cardiology Nephrology   Procedures/Studies: ECHOCARDIOGRAM COMPLETE  Result Date: 09/29/2023    ECHOCARDIOGRAM REPORT   Patient Name:   Derek Cooley. Date of Exam: 09/29/2023 Medical Rec #:  [id number]          Height:       67.0 in Accession #:    [id number]         Weight:       209.0 lb Date of Birth:  [birth year]         BSA:          2.060 m Patient Age:    56 years           BP:           214/100 mmHg Patient Gender: M                  HR:           77 bpm. Exam Location:  [facility] Procedure: 2D Echo, Cardiac Doppler, Color Doppler and Strain Analysis Indications:     Elevated Troponin  History:         Patient has prior history of Echocardiogram examinations. CHF;                  Risk Factors:Hypertension.  Sonographer:     Neysa Bonito Roar Referring Phys:  [id number] Debbe Odea Diagnosing Phys: Debbe Odea MD  Sonographer Comments: Global longitudinal strain was attempted. IMPRESSIONS  1. Left ventricular ejection fraction, by estimation, is 60 to  65%. Left ventricular ejection fraction by PLAX is 63 %. The left ventricle has normal function. The left ventricle has no regional wall motion abnormalities. There is mild left ventricular hypertrophy. Left ventricular diastolic parameters are consistent with Grade II diastolic dysfunction (pseudonormalization). The average left ventricular global longitudinal strain is -17.7 %. The global longitudinal strain is normal.  2. Right ventricular systolic function is normal. The right ventricular size is normal.  3. Left atrial size was mildly dilated.  4. The mitral valve is normal in structure. Trivial mitral valve regurgitation.  5. The aortic valve is tricuspid. Aortic valve regurgitation is not visualized. Aortic valve sclerosis/calcification is present, without any evidence of aortic stenosis. FINDINGS  Left Ventricle: Left ventricular ejection fraction, by estimation, is 60 to 65%. Left ventricular ejection fraction by PLAX is 63 %. The left ventricle has normal function. The left ventricle has no regional wall motion abnormalities. The average left ventricular global longitudinal strain is -17.7 %. The global longitudinal strain is normal. The left ventricular internal cavity size was normal in size. There is mild left ventricular hypertrophy. Left ventricular diastolic parameters are consistent with Grade II diastolic dysfunction (pseudonormalization). Right Ventricle: The right ventricular size is normal. No increase in right ventricular wall thickness. Right ventricular systolic function is normal. Left Atrium: Left atrial size was mildly dilated. Right Atrium: Right atrial size was normal in size. Pericardium: There is no evidence of pericardial effusion. Mitral Valve: The mitral valve is normal in structure. Trivial mitral valve regurgitation. MV peak gradient, 7.0 mmHg. The mean mitral valve gradient is 3.0 mmHg. Tricuspid Valve: The tricuspid valve is not well visualized. Tricuspid valve regurgitation is  mild. Aortic Valve: The aortic valve is tricuspid. Aortic valve regurgitation is not visualized. Aortic valve sclerosis/calcification is present, without any evidence of aortic stenosis. Aortic valve mean gradient measures 5.0 mmHg. Aortic valve peak gradient measures 11.7 mmHg. Aortic valve area, by VTI measures 3.30 cm. Pulmonic Valve: The pulmonic  Physician Discharge Summary  Derek Cooley. KGU:[id number] DOB: 1966-05-28 DOA: 09/27/2023  PCP: Larae Grooms, NP  Admit date: 09/27/2023 Discharge date: 10/03/2023  Admitted From: Home Disposition:  Home  Recommendations for Outpatient Follow-up:  Follow up with PCP in 1-2 weeks Follow up with cardiology 2-4 weeks Follow up with nephrology as previously scheduled  Home Health:No Equipment/Devices:Oxygen 3l via [place]   Discharge Condition: Stable CODE STATUS: Full Diet recommendation: Heart healthy with fluid restriction  Brief/Interim Summary:   57 year old male history significant for ESRD on HD, CHF, hypertension, hyperlipidemia, chronic hypoxic respiratory failure who presents to the ED with progressively worsening dyspnea associated with cough and wheeze   Patient has a long history of medication and treatment nonadherence.  Frequently misses dialysis.  Patient states he missed his last 3 dialysis sessions.  He is markedly hypertensive with systolics greater than 200.  Currently on nitroglycerin drip.   10/20: Blood pressure improved.  Weaned off nitroglycerin drip.  Stable for transfer to general medical service.  10/22: Dialyzed successfully on day of discharge.  Blood pressure improved.  Endorses pain in left shoulder secondary to chronic rotator cuff tear.  Stable for discharge home at this time.  Follow-up outpatient PCP, cardiology. Family however never came to get him  10/23: confused this morning and slurring speech, review of mar reveals he had both oxycodone and xanax this morning, prn meds he is not on at home. Confusion and slurred speech resolved several hours later so appears to be secondary to those meds. Respiratory status and BP remain stable, discharge orders placed.  10/24: family never came to get patient yesterday so was not discharged. Tolerated dialysis this morning and family has arrived to take him. BP well controlled.   Discharge Diagnoses:   Principal Problem:   NSTEMI (non-ST elevated myocardial infarction) (HCC) Active Problems:   Acute on chronic diastolic heart failure (HCC)   End-stage renal disease on hemodialysis (HCC)   Essential hypertension   Hypertensive emergency   Type 2 diabetes mellitus with chronic kidney disease, without long-term current use of insulin (HCC)   Heart failure with preserved ejection fraction (HCC)   GERD without esophagitis   Anxiety and depression   Smoking   NSVT (nonsustained ventricular tachycardia) (HCC)   Hypertensive urgency  ESRD on HD Hypertensive urgency Patient initially required admission to stepdown unit and nitroglycerin gtt.  Received stat dialysis on 10/19.  Blood pressure control improved.  Restarted on home medications. Plan: Continue carvedilol, amlodipine, losartan Ensure adherence to outpatient HD schedule  Elevated troponin Cardiology evaluated, think this supply/demand ischemia in the setting of hypertensive urgency and ESRD Echo normal ejection fraction Plan: Completed 48 hours of intravenous heparin.  At time of discharge recommend aspirin, beta-blocker, NT lipid agent.  Intolerant of statin.   Acute heart failure with preserved ejection fraction In the setting of multiple missed dialysis sessions Volume management with dialysis     Discharge Instructions  Discharge Instructions     Diet - low sodium heart healthy   Complete by: As directed    Diet - low sodium heart healthy   Complete by: As directed    Increase activity slowly   Complete by: As directed    Increase activity slowly   Complete by: As directed    No wound care   Complete by: As directed    No wound care   Complete by: As directed       Allergies as of 10/03/2023  Consultations: Cardiology Nephrology   Procedures/Studies: ECHOCARDIOGRAM COMPLETE  Result Date: 09/29/2023    ECHOCARDIOGRAM REPORT   Patient Name:   Derek Cooley. Date of Exam: 09/29/2023 Medical Rec #:  [id number]          Height:       67.0 in Accession #:    [id number]         Weight:       209.0 lb Date of Birth:  [birth year]         BSA:          2.060 m Patient Age:    56 years           BP:           214/100 mmHg Patient Gender: M                  HR:           77 bpm. Exam Location:  [facility] Procedure: 2D Echo, Cardiac Doppler, Color Doppler and Strain Analysis Indications:     Elevated Troponin  History:         Patient has prior history of Echocardiogram examinations. CHF;                  Risk Factors:Hypertension.  Sonographer:     Neysa Bonito Roar Referring Phys:  [id number] Debbe Odea Diagnosing Phys: Debbe Odea MD  Sonographer Comments: Global longitudinal strain was attempted. IMPRESSIONS  1. Left ventricular ejection fraction, by estimation, is 60 to  65%. Left ventricular ejection fraction by PLAX is 63 %. The left ventricle has normal function. The left ventricle has no regional wall motion abnormalities. There is mild left ventricular hypertrophy. Left ventricular diastolic parameters are consistent with Grade II diastolic dysfunction (pseudonormalization). The average left ventricular global longitudinal strain is -17.7 %. The global longitudinal strain is normal.  2. Right ventricular systolic function is normal. The right ventricular size is normal.  3. Left atrial size was mildly dilated.  4. The mitral valve is normal in structure. Trivial mitral valve regurgitation.  5. The aortic valve is tricuspid. Aortic valve regurgitation is not visualized. Aortic valve sclerosis/calcification is present, without any evidence of aortic stenosis. FINDINGS  Left Ventricle: Left ventricular ejection fraction, by estimation, is 60 to 65%. Left ventricular ejection fraction by PLAX is 63 %. The left ventricle has normal function. The left ventricle has no regional wall motion abnormalities. The average left ventricular global longitudinal strain is -17.7 %. The global longitudinal strain is normal. The left ventricular internal cavity size was normal in size. There is mild left ventricular hypertrophy. Left ventricular diastolic parameters are consistent with Grade II diastolic dysfunction (pseudonormalization). Right Ventricle: The right ventricular size is normal. No increase in right ventricular wall thickness. Right ventricular systolic function is normal. Left Atrium: Left atrial size was mildly dilated. Right Atrium: Right atrial size was normal in size. Pericardium: There is no evidence of pericardial effusion. Mitral Valve: The mitral valve is normal in structure. Trivial mitral valve regurgitation. MV peak gradient, 7.0 mmHg. The mean mitral valve gradient is 3.0 mmHg. Tricuspid Valve: The tricuspid valve is not well visualized. Tricuspid valve regurgitation is  mild. Aortic Valve: The aortic valve is tricuspid. Aortic valve regurgitation is not visualized. Aortic valve sclerosis/calcification is present, without any evidence of aortic stenosis. Aortic valve mean gradient measures 5.0 mmHg. Aortic valve peak gradient measures 11.7 mmHg. Aortic valve area, by VTI measures 3.30 cm. Pulmonic Valve: The pulmonic  Consultations: Cardiology Nephrology   Procedures/Studies: ECHOCARDIOGRAM COMPLETE  Result Date: 09/29/2023    ECHOCARDIOGRAM REPORT   Patient Name:   Derek Cooley. Date of Exam: 09/29/2023 Medical Rec #:  [id number]          Height:       67.0 in Accession #:    [id number]         Weight:       209.0 lb Date of Birth:  [birth year]         BSA:          2.060 m Patient Age:    56 years           BP:           214/100 mmHg Patient Gender: M                  HR:           77 bpm. Exam Location:  [facility] Procedure: 2D Echo, Cardiac Doppler, Color Doppler and Strain Analysis Indications:     Elevated Troponin  History:         Patient has prior history of Echocardiogram examinations. CHF;                  Risk Factors:Hypertension.  Sonographer:     Neysa Bonito Roar Referring Phys:  [id number] Debbe Odea Diagnosing Phys: Debbe Odea MD  Sonographer Comments: Global longitudinal strain was attempted. IMPRESSIONS  1. Left ventricular ejection fraction, by estimation, is 60 to  65%. Left ventricular ejection fraction by PLAX is 63 %. The left ventricle has normal function. The left ventricle has no regional wall motion abnormalities. There is mild left ventricular hypertrophy. Left ventricular diastolic parameters are consistent with Grade II diastolic dysfunction (pseudonormalization). The average left ventricular global longitudinal strain is -17.7 %. The global longitudinal strain is normal.  2. Right ventricular systolic function is normal. The right ventricular size is normal.  3. Left atrial size was mildly dilated.  4. The mitral valve is normal in structure. Trivial mitral valve regurgitation.  5. The aortic valve is tricuspid. Aortic valve regurgitation is not visualized. Aortic valve sclerosis/calcification is present, without any evidence of aortic stenosis. FINDINGS  Left Ventricle: Left ventricular ejection fraction, by estimation, is 60 to 65%. Left ventricular ejection fraction by PLAX is 63 %. The left ventricle has normal function. The left ventricle has no regional wall motion abnormalities. The average left ventricular global longitudinal strain is -17.7 %. The global longitudinal strain is normal. The left ventricular internal cavity size was normal in size. There is mild left ventricular hypertrophy. Left ventricular diastolic parameters are consistent with Grade II diastolic dysfunction (pseudonormalization). Right Ventricle: The right ventricular size is normal. No increase in right ventricular wall thickness. Right ventricular systolic function is normal. Left Atrium: Left atrial size was mildly dilated. Right Atrium: Right atrial size was normal in size. Pericardium: There is no evidence of pericardial effusion. Mitral Valve: The mitral valve is normal in structure. Trivial mitral valve regurgitation. MV peak gradient, 7.0 mmHg. The mean mitral valve gradient is 3.0 mmHg. Tricuspid Valve: The tricuspid valve is not well visualized. Tricuspid valve regurgitation is  mild. Aortic Valve: The aortic valve is tricuspid. Aortic valve regurgitation is not visualized. Aortic valve sclerosis/calcification is present, without any evidence of aortic stenosis. Aortic valve mean gradient measures 5.0 mmHg. Aortic valve peak gradient measures 11.7 mmHg. Aortic valve area, by VTI measures 3.30 cm. Pulmonic Valve: The pulmonic  Consultations: Cardiology Nephrology   Procedures/Studies: ECHOCARDIOGRAM COMPLETE  Result Date: 09/29/2023    ECHOCARDIOGRAM REPORT   Patient Name:   Derek Cooley. Date of Exam: 09/29/2023 Medical Rec #:  [id number]          Height:       67.0 in Accession #:    [id number]         Weight:       209.0 lb Date of Birth:  [birth year]         BSA:          2.060 m Patient Age:    56 years           BP:           214/100 mmHg Patient Gender: M                  HR:           77 bpm. Exam Location:  [facility] Procedure: 2D Echo, Cardiac Doppler, Color Doppler and Strain Analysis Indications:     Elevated Troponin  History:         Patient has prior history of Echocardiogram examinations. CHF;                  Risk Factors:Hypertension.  Sonographer:     Neysa Bonito Roar Referring Phys:  [id number] Debbe Odea Diagnosing Phys: Debbe Odea MD  Sonographer Comments: Global longitudinal strain was attempted. IMPRESSIONS  1. Left ventricular ejection fraction, by estimation, is 60 to  65%. Left ventricular ejection fraction by PLAX is 63 %. The left ventricle has normal function. The left ventricle has no regional wall motion abnormalities. There is mild left ventricular hypertrophy. Left ventricular diastolic parameters are consistent with Grade II diastolic dysfunction (pseudonormalization). The average left ventricular global longitudinal strain is -17.7 %. The global longitudinal strain is normal.  2. Right ventricular systolic function is normal. The right ventricular size is normal.  3. Left atrial size was mildly dilated.  4. The mitral valve is normal in structure. Trivial mitral valve regurgitation.  5. The aortic valve is tricuspid. Aortic valve regurgitation is not visualized. Aortic valve sclerosis/calcification is present, without any evidence of aortic stenosis. FINDINGS  Left Ventricle: Left ventricular ejection fraction, by estimation, is 60 to 65%. Left ventricular ejection fraction by PLAX is 63 %. The left ventricle has normal function. The left ventricle has no regional wall motion abnormalities. The average left ventricular global longitudinal strain is -17.7 %. The global longitudinal strain is normal. The left ventricular internal cavity size was normal in size. There is mild left ventricular hypertrophy. Left ventricular diastolic parameters are consistent with Grade II diastolic dysfunction (pseudonormalization). Right Ventricle: The right ventricular size is normal. No increase in right ventricular wall thickness. Right ventricular systolic function is normal. Left Atrium: Left atrial size was mildly dilated. Right Atrium: Right atrial size was normal in size. Pericardium: There is no evidence of pericardial effusion. Mitral Valve: The mitral valve is normal in structure. Trivial mitral valve regurgitation. MV peak gradient, 7.0 mmHg. The mean mitral valve gradient is 3.0 mmHg. Tricuspid Valve: The tricuspid valve is not well visualized. Tricuspid valve regurgitation is  mild. Aortic Valve: The aortic valve is tricuspid. Aortic valve regurgitation is not visualized. Aortic valve sclerosis/calcification is present, without any evidence of aortic stenosis. Aortic valve mean gradient measures 5.0 mmHg. Aortic valve peak gradient measures 11.7 mmHg. Aortic valve area, by VTI measures 3.30 cm. Pulmonic Valve: The pulmonic

## 2023-10-01 NOTE — Progress Notes (Signed)
Received patient in bed to unit.    Informed consent signed and in chart.    TX duration: 3.5 hrs     Transported back to floor  Hand-off given to patient's nurse.   Access used:  lt avf Access issues: n/a  Total UF removed: 2500 mls      Maple Hudson, RN Dialysis Unit

## 2023-10-01 NOTE — Progress Notes (Signed)
[facility]  PROGRESS NOTE   Subjective:   Patient seen and evaluated during dialysis   HEMODIALYSIS FLOWSHEET:  Blood Flow Rate (mL/min): 400 mL/min Arterial Pressure (mmHg): -180 mmHg Venous Pressure (mmHg): 230 mmHg TMP (mmHg): 4.04 mmHg Ultrafiltration Rate (mL/min): 1227 mL/min Dialysate Flow Rate (mL/min): 299 ml/min Dialysis Fluid Bolus: Normal Saline  Tolerating treatment well States he feels better today  Objective:  Vital signs: Blood pressure (!) 121/93, pulse 76, temperature 97.9 F (36.6 C), temperature source Oral, resp. rate 19, weight 91.9 kg, SpO2 96%.  Intake/Output Summary (Last 24 hours) at 10/01/2023 1039 Last data filed at 09/30/2023 1415 Gross per 24 hour  Intake --  Output 3400 ml  Net -3400 ml   Filed Weights   09/30/23 0749 09/30/23 1320 10/01/23 0750  Weight: 95.2 kg 93.6 kg 91.9 kg     Physical Exam: General:  No acute distress  Head:  Normocephalic, atraumatic. Moist oral mucosal membranes  Eyes:  Anicteric  Lungs:   Clear to auscultation, normal effort  Heart:  S1S2 no rubs  Abdomen:   Soft, nontender, bowel sounds present  Extremities:  No peripheral edema.  Neurologic:  Awake, alert, following commands  Skin:  No lesions  Access: Lt AVF    Basic Metabolic Panel: Recent Labs  Lab 09/27/23 2036 09/28/23 0725 09/29/23 0718 09/30/23 0546 10/01/23 0752  NA 142 139  --  133* 134*  K 4.0 4.4 3.2* 4.2 3.8  CL 106 104  --  97* 98  CO2 24 22  --  26 27  GLUCOSE 138* 198*  --  92 93  BUN 96* 95*  --  73* 54*  CREATININE 6.93* 7.03*  --  5.60* 4.53*  CALCIUM 8.1* 8.0*  --  7.8* 7.8*  MG  --   --  2.0  --   --   PHOS  --   --   --  6.0* 4.0   GFR: Estimated Creatinine Clearance: 19.4 mL/min (A) (by C-G formula based on SCr of 4.53 mg/dL (H)).  Liver Function Tests: Recent Labs  Lab 09/27/23 2251 09/30/23 0546 10/01/23 0752  AST 19  --   --   ALT 23  --   --   ALKPHOS 82  --   --   BILITOT 1.0  --   --    PROT 6.8  --   --   ALBUMIN 3.6 3.2* 3.0*   No results for input(s): "LIPASE", "AMYLASE" in the last 168 hours. No results for input(s): "AMMONIA" in the last 168 hours.  CBC: Recent Labs  Lab 09/27/23 2036 09/28/23 0725 09/29/23 0718 09/30/23 0546  WBC 8.3 8.4 13.0* 10.2  HGB 10.0* 9.5* 9.5* 10.4*  HCT 30.6* 29.2* 28.6* 31.8*  MCV 99.4 98.0 95.0 96.1  PLT 201 205 203 204     HbA1C: HB A1C (BAYER DCA - WAIVED)  Date/Time Value Ref Range Status  04/17/2016 08:25 AM 5.6 <7.0 % Final    Comment:                                          Diabetic Adult            <7.0  Healthy Adult        4.3 - 5.7                                                           (DCCT/NGSP) American Diabetes Association's Summary of Glycemic Recommendations for Adults with Diabetes: Hemoglobin A1c <7.0%. More stringent glycemic goals (A1c <6.0%) may further reduce complications at the cost of increased risk of hypoglycemia.    Hgb A1c MFr Bld  Date/Time Value Ref Range Status  06/10/2023 02:31 PM 5.5 4.8 - 5.6 % Final    Comment:             Prediabetes: 5.7 - 6.4          Diabetes: >6.4          Glycemic control for adults with diabetes: <7.0   01/07/2023 04:10 PM 6.3 (H) 4.8 - 5.6 % Final    Comment:             Prediabetes: 5.7 - 6.4          Diabetes: >6.4          Glycemic control for adults with diabetes: <7.0     Urinalysis: No results for input(s): "COLORURINE", "LABSPEC", "PHURINE", "GLUCOSEU", "HGBUR", "BILIRUBINUR", "KETONESUR", "PROTEINUR", "UROBILINOGEN", "NITRITE", "LEUKOCYTESUR" in the last 72 hours.  Invalid input(s): "APPERANCEUR"    Imaging: ECHOCARDIOGRAM COMPLETE  Result Date: 09/29/2023    ECHOCARDIOGRAM REPORT   Patient Name:   Derek Cooley. Date of Exam: 09/29/2023 Medical Rec #:  [id number]          Height:       67.0 in Accession #:    [id number]         Weight:       209.0 lb Date of Birth:  07-31-1966         BSA:           2.060 m Patient Age:    57 years           BP:           214/100 mmHg Patient Gender: M                  HR:           77 bpm. Exam Location:  [facility] Procedure: 2D Echo, Cardiac Doppler, Color Doppler and Strain Analysis Indications:     Elevated Troponin  History:         Patient has prior history of Echocardiogram examinations. CHF;                  Risk Factors:Hypertension.  Sonographer:     Neysa Bonito Roar Referring Phys:  [id number] Debbe Odea Diagnosing Phys: Debbe Odea MD  Sonographer Comments: Global longitudinal strain was attempted. IMPRESSIONS  1. Left ventricular ejection fraction, by estimation, is 60 to 65%. Left ventricular ejection fraction by PLAX is 63 %. The left ventricle has normal function. The left ventricle has no regional wall motion abnormalities. There is mild left ventricular hypertrophy. Left ventricular diastolic parameters are consistent with Grade II diastolic dysfunction (pseudonormalization). The average left ventricular global longitudinal strain is -17.7 %. The global longitudinal strain is normal.  2. Right ventricular systolic function is normal. The right ventricular size is normal.  3. Left atrial size  was mildly dilated.  4. The mitral valve is normal in structure. Trivial mitral valve regurgitation.  5. The aortic valve is tricuspid. Aortic valve regurgitation is not visualized. Aortic valve sclerosis/calcification is present, without any evidence of aortic stenosis. FINDINGS  Left Ventricle: Left ventricular ejection fraction, by estimation, is 60 to 65%. Left ventricular ejection fraction by PLAX is 63 %. The left ventricle has normal function. The left ventricle has no regional wall motion abnormalities. The average left ventricular global longitudinal strain is -17.7 %. The global longitudinal strain is normal. The left ventricular internal cavity size was normal in size. There is mild left ventricular hypertrophy. Left ventricular diastolic parameters  are consistent with Grade II diastolic dysfunction (pseudonormalization). Right Ventricle: The right ventricular size is normal. No increase in right ventricular wall thickness. Right ventricular systolic function is normal. Left Atrium: Left atrial size was mildly dilated. Right Atrium: Right atrial size was normal in size. Pericardium: There is no evidence of pericardial effusion. Mitral Valve: The mitral valve is normal in structure. Trivial mitral valve regurgitation. MV peak gradient, 7.0 mmHg. The mean mitral valve gradient is 3.0 mmHg. Tricuspid Valve: The tricuspid valve is not well visualized. Tricuspid valve regurgitation is mild. Aortic Valve: The aortic valve is tricuspid. Aortic valve regurgitation is not visualized. Aortic valve sclerosis/calcification is present, without any evidence of aortic stenosis. Aortic valve mean gradient measures 5.0 mmHg. Aortic valve peak gradient measures 11.7 mmHg. Aortic valve area, by VTI measures 3.30 cm. Pulmonic Valve: The pulmonic valve was normal in structure. Pulmonic valve regurgitation is trivial. Aorta: The aortic root is normal in size and structure. Venous: The inferior vena cava was not well visualized. IAS/Shunts: No atrial level shunt detected by color flow Doppler.  LEFT VENTRICLE PLAX 2D LV EF:         Left            Diastology                ventricular     LV e' medial:    6.42 cm/s                ejection        LV E/e' medial:  15.7                fraction by     LV e' lateral:   6.85 cm/s                PLAX is 63      LV E/e' lateral: 14.7                %. LVIDd:         5.00 cm         2D LVIDs:         3.30 cm         Longitudinal LV PW:         1.30 cm         Strain LV IVS:        1.60 cm         2D Strain GLS  -17.7 % LVOT diam:     2.20 cm         Avg: LV SV:         81 LV SV Index:   39 LVOT Area:     3.80 cm  RIGHT VENTRICLE RV Basal diam:  3.50 cm RV Mid diam:    3.20 cm RV S  prime:     19.50 cm/s TAPSE (M-mode): 2.7 cm LEFT ATRIUM              Index        RIGHT ATRIUM           Index LA diam:        4.70 cm 2.28 cm/m   RA Area:     15.40 cm LA Vol (A2C):   51.5 ml 24.99 ml/m  RA Volume:   33.30 ml  16.16 ml/m LA Vol (A4C):   80.6 ml 39.12 ml/m LA Biplane Vol: 66.0 ml 32.03 ml/m  AORTIC VALVE                     PULMONIC VALVE AV Area (Vmax):    2.69 cm      PV Vmax:          0.95 m/s AV Area (Vmean):   3.13 cm      PV Peak grad:     3.6 mmHg AV Area (VTI):     3.30 cm      PR End Diast Vel: 7.62 msec AV Vmax:           171.00 cm/s   RVOT Peak grad:   2 mmHg AV Vmean:          104.000 cm/s AV VTI:            0.245 m AV Peak Grad:      11.7 mmHg AV Mean Grad:      5.0 mmHg LVOT Vmax:         121.00 cm/s LVOT Vmean:        85.600 cm/s LVOT VTI:          0.213 m LVOT/AV VTI ratio: 0.87  AORTA Ao Root diam: 2.90 cm Ao Asc diam:  3.70 cm MITRAL VALVE                TRICUSPID VALVE MV Area (PHT): 3.13 cm     TR Peak grad:   32.3 mmHg MV Area VTI:   2.69 cm     TR Vmax:        284.00 cm/s MV Peak grad:  7.0 mmHg MV Mean grad:  3.0 mmHg     SHUNTS MV Vmax:       1.32 m/s     Systemic VTI:  0.21 m MV Vmean:      78.7 cm/s    Systemic Diam: 2.20 cm MV Decel Time: 242 msec MV E velocity: 101.00 cm/s MV A velocity: 95.90 cm/s MV E/A ratio:  1.05 MV A Prime:    11.2 cm/s Debbe Odea MD Electronically signed by Debbe Odea MD Signature Date/Time: 09/29/2023/2:56:12 PM    Final      Medications:      amLODipine  10 mg Oral Daily   aspirin EC  81 mg Oral Daily   carvedilol  6.25 mg Oral BID WC   Chlorhexidine Gluconate Cloth  6 each Topical Q0600   DULoxetine  60 mg Oral Daily   ezetimibe  10 mg Oral Daily   fluticasone furoate-vilanterol  1 puff Inhalation Daily   And   umeclidinium bromide  1 puff Inhalation Daily   heparin injection (subcutaneous)  5,000 Units Subcutaneous Q8H   lidocaine  1 patch Transdermal Q24H   losartan  100 mg Oral Daily   nicotine  21 mg Transdermal Daily   pantoprazole  40 mg Oral Daily  topiramate  50 mg Oral BID    Assessment/ Plan:     57 year old male with history of hypertension, coronary artery disease, congestive heart failure, hyperlipidemia, peripheral vascular disease, diabetes, GERD, asthma, end-stage renal disease on dialysis was admitted through the emergency room with onset of dry cough, dyspnea associated with wheezing.  He had been on dialysis and has been noncompliant with his dialysis treatments.  He missed 3 treatments as per the family.  He also had NSTEMI and is on heparin drip at this time.   [facility]/TTS/Lt AVF   ESRD:Dialysis received yesterday,  3L achieved. Receiving treatment today to maintain outpatient schedule. Next treatment scheduled for Thursday.   ANEMIA: Patient receives Mircera at outpatient clinic.   Secondary hyperparathyroidism. 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   Blood pressure acceptable for this patient, 165/79   Diabetes mellitus type II with chronic kidney disease/renal manifestations: noninsulin dependent. Primary team to manage SSI     LOS: 4 [facility] 10/22/202410:39 AM

## 2023-10-02 DIAGNOSIS — I214 Non-ST elevation (NSTEMI) myocardial infarction: Secondary | ICD-10-CM | POA: Diagnosis not present

## 2023-10-02 LAB — BLOOD GAS, VENOUS
Acid-Base Excess: 3.5 mmol/L — ABNORMAL HIGH (ref 0.0–2.0)
Bicarbonate: 30.8 mmol/L — ABNORMAL HIGH (ref 20.0–28.0)
O2 Saturation: 85.2 %
Patient temperature: 37
pCO2, Ven: 57 mm[Hg] (ref 44–60)
pH, Ven: 7.34 (ref 7.25–7.43)
pO2, Ven: 54 mm[Hg] — ABNORMAL HIGH (ref 32–45)

## 2023-10-02 LAB — COMPREHENSIVE METABOLIC PANEL
ALT: 17 U/L (ref 0–44)
AST: 14 U/L — ABNORMAL LOW (ref 15–41)
Albumin: 3.2 g/dL — ABNORMAL LOW (ref 3.5–5.0)
Alkaline Phosphatase: 61 U/L (ref 38–126)
Anion gap: 8 (ref 5–15)
BUN: 43 mg/dL — ABNORMAL HIGH (ref 6–20)
CO2: 28 mmol/L (ref 22–32)
Calcium: 8.3 mg/dL — ABNORMAL LOW (ref 8.9–10.3)
Chloride: 98 mmol/L (ref 98–111)
Creatinine, Ser: 4.55 mg/dL — ABNORMAL HIGH (ref 0.61–1.24)
GFR, Estimated: 14 mL/min — ABNORMAL LOW (ref >60)
Glucose, Bld: 99 mg/dL (ref 70–99)
Potassium: 4.1 mmol/L (ref 3.5–5.1)
Sodium: 134 mmol/L — ABNORMAL LOW (ref 135–145)
Total Bilirubin: 0.7 mg/dL (ref 0.3–1.2)
Total Protein: 6.5 g/dL (ref 6.5–8.1)

## 2023-10-02 LAB — LIPOPROTEIN A (LPA): Lipoprotein (a): <8.4 nmol/L (ref <75.0)

## 2023-10-02 MED ORDER — HEPARIN SODIUM (PORCINE) 1000 UNIT/ML DIALYSIS
25.0000 [IU]/kg | INTRAMUSCULAR | Status: DC | PRN
  Administered 2023-10-03: 2300 [IU] via INTRAVENOUS_CENTRAL
  Filled 2023-10-02: qty 3

## 2023-10-02 MED ORDER — PENTAFLUOROPROP-TETRAFLUOROETH EX AERO: 1.0000 | INHALATION_SPRAY | CUTANEOUS | Status: DC | PRN

## 2023-10-02 MED ORDER — HEPARIN SODIUM (PORCINE) 1000 UNIT/ML DIALYSIS: 1000.0000 [IU] | INTRAMUSCULAR | Status: DC | PRN

## 2023-10-02 MED ORDER — LIDOCAINE-PRILOCAINE 2.5-2.5 % EX CREA: 1.0000 | TOPICAL_CREAM | CUTANEOUS | Status: DC | PRN

## 2023-10-02 NOTE — Progress Notes (Signed)
OT Cancellation Note  Patient Details Name: Derek Cooley. MRN: [id number] DOB: Nov 24, 1966   Cancelled Treatment:    Reason Eval/Treat Not Completed: OT screened, no needs identified, will sign off. Per PT report pt is at functional baseline and declines OT evaluation. OT to complete order.   Jackquline Denmark, MS, OTR/L , CBIS ascom [phone]  10/02/23, 1:32 PM

## 2023-10-02 NOTE — Progress Notes (Signed)
Sister Charlies Constable called and asked about estimate arrival time for patient ride home. Sister stated ride will be able to pick patient up in 1hr (1830). AVS printed and review. PIV removed. Patient dressed and ready to go .

## 2023-10-02 NOTE — Plan of Care (Signed)

## 2023-10-02 NOTE — Progress Notes (Signed)
[facility]  PROGRESS NOTE   Subjective:   Patient sitting up in bed Drowsy but arousable Denies pain  Room air  Dialysis received yesterday, tolerated well  Objective:  Vital signs: Blood pressure (!) 151/85, pulse (!) 55, temperature 98.1 F (36.7 C), resp. rate 16, weight 90.4 kg, SpO2 99%.  Intake/Output Summary (Last 24 hours) at 10/02/2023 1125 Last data filed at 10/02/2023 0900 Gross per 24 hour  Intake 0 ml  Output 2900 ml  Net -2900 ml   Filed Weights   09/30/23 1320 10/01/23 0750 10/01/23 1257  Weight: 93.6 kg 91.9 kg 90.4 kg     Physical Exam: General:  No acute distress  Head:  Normocephalic, atraumatic. Moist oral mucosal membranes  Eyes:  Anicteric  Lungs:   Clear to auscultation, normal effort  Heart:  S1S2 no rubs  Abdomen:   Soft, nontender, bowel sounds present  Extremities:  No peripheral edema.  Neurologic:  Awake, following commands  Skin:  No lesions  Access: Lt AVF    Basic Metabolic Panel: Recent Labs  Lab 09/27/23 2036 09/28/23 0725 09/29/23 0718 09/30/23 0546 10/01/23 0752  NA 142 139  --  133* 134*  K 4.0 4.4 3.2* 4.2 3.8  CL 106 104  --  97* 98  CO2 24 22  --  26 27  GLUCOSE 138* 198*  --  92 93  BUN 96* 95*  --  73* 54*  CREATININE 6.93* 7.03*  --  5.60* 4.53*  CALCIUM 8.1* 8.0*  --  7.8* 7.8*  MG  --   --  2.0  --   --   PHOS  --   --   --  6.0* 4.0   GFR: Estimated Creatinine Clearance: 19.3 mL/min (A) (by C-G formula based on SCr of 4.53 mg/dL (H)).  Liver Function Tests: Recent Labs  Lab 09/27/23 2251 09/30/23 0546 10/01/23 0752  AST 19  --   --   ALT 23  --   --   ALKPHOS 82  --   --   BILITOT 1.0  --   --   PROT 6.8  --   --   ALBUMIN 3.6 3.2* 3.0*   No results for input(s): "LIPASE", "AMYLASE" in the last 168 hours. No results for input(s): "AMMONIA" in the last 168 hours.  CBC: Recent Labs  Lab 09/27/23 2036 09/28/23 0725 09/29/23 0718 09/30/23 0546  WBC 8.3 8.4 13.0* 10.2  HGB  10.0* 9.5* 9.5* 10.4*  HCT 30.6* 29.2* 28.6* 31.8*  MCV 99.4 98.0 95.0 96.1  PLT 201 205 203 204     HbA1C: HB A1C (BAYER DCA - WAIVED)  Date/Time Value Ref Range Status  04/17/2016 08:25 AM 5.6 <7.0 % Final    Comment:                                          Diabetic Adult            <7.0                                       Healthy Adult        4.3 - 5.7                                                           (  DCCT/NGSP) American Diabetes Association's Summary of Glycemic Recommendations for Adults with Diabetes: Hemoglobin A1c <7.0%. More stringent glycemic goals (A1c <6.0%) may further reduce complications at the cost of increased risk of hypoglycemia.    Hgb A1c MFr Bld  Date/Time Value Ref Range Status  06/10/2023 02:31 PM 5.5 4.8 - 5.6 % Final    Comment:             Prediabetes: 5.7 - 6.4          Diabetes: >6.4          Glycemic control for adults with diabetes: <7.0   01/07/2023 04:10 PM 6.3 (H) 4.8 - 5.6 % Final    Comment:             Prediabetes: 5.7 - 6.4          Diabetes: >6.4          Glycemic control for adults with diabetes: <7.0     Urinalysis: No results for input(s): "COLORURINE", "LABSPEC", "PHURINE", "GLUCOSEU", "HGBUR", "BILIRUBINUR", "KETONESUR", "PROTEINUR", "UROBILINOGEN", "NITRITE", "LEUKOCYTESUR" in the last 72 hours.  Invalid input(s): "APPERANCEUR"    Imaging: No results found.   Medications:      amLODipine  10 mg Oral Daily   aspirin EC  81 mg Oral Daily   carvedilol  6.25 mg Oral BID WC   Chlorhexidine Gluconate Cloth  6 each Topical Q0600   DULoxetine  60 mg Oral Daily   ezetimibe  10 mg Oral Daily   fluticasone furoate-vilanterol  1 puff Inhalation Daily   And   umeclidinium bromide  1 puff Inhalation Daily   heparin injection (subcutaneous)  5,000 Units Subcutaneous Q8H   lidocaine  1 patch Transdermal Q24H   losartan  100 mg Oral Daily   nicotine  21 mg Transdermal Daily   pantoprazole  40 mg Oral Daily     Assessment/ Plan:     57 year old male with history of hypertension, coronary artery disease, congestive heart failure, hyperlipidemia, peripheral vascular disease, diabetes, GERD, asthma, end-stage renal disease on dialysis was admitted through the emergency room with onset of dry cough, dyspnea associated with wheezing.  He had been on dialysis and has been noncompliant with his dialysis treatments.  He missed 3 treatments as per the family.  He also had NSTEMI and is on heparin drip at this time.   [facility]/TTS/Lt AVF   ESRD: Dialysis received yesterday, UF 2.5L achieved. Next treatment scheduled for Thursday  ANEMIA: Patient receives Mircera at outpatient clinic.Hgb 10.4, within optimal range.    Secondary hyperparathyroidism. MBD: Corrected calcium acceptable, phosphorus improved.    HTN with chronic kidney disease: Now on amlodipine, carvedilol and losartan.   Blood pressure 151/85 during dialysis   Diabetes mellitus type II with chronic kidney disease/renal manifestations: noninsulin dependent. Primary team to manage SSI     LOS: 5 [facility] 10/23/202411:25 AM

## 2023-10-02 NOTE — Progress Notes (Signed)
PT Cancellation Note  Patient Details Name: Derek Cooley. MRN: [id number] DOB: [birth year]   Cancelled Treatment:    Reason Eval/Treat Not Completed: PT screened, no needs identified, will sign off. Pt demonstrates independent bed mobility and mod I transfers which Pt reports is his baseline. Pt reports no need for PT and feels he is fine. Upon PT screen, no needs are identified. Communicated with MD regarding findings-will discontinue acute skilled PT at this time.   Shaylea Ucci 10/02/2023, 1:28 PM

## 2023-10-03 DIAGNOSIS — I214 Non-ST elevation (NSTEMI) myocardial infarction: Secondary | ICD-10-CM | POA: Diagnosis not present

## 2023-10-03 LAB — RENAL FUNCTION PANEL
Albumin: 3.1 g/dL — ABNORMAL LOW (ref 3.5–5.0)
Anion gap: 11 (ref 5–15)
BUN: 54 mg/dL — ABNORMAL HIGH (ref 6–20)
CO2: 24 mmol/L (ref 22–32)
Calcium: 7.7 mg/dL — ABNORMAL LOW (ref 8.9–10.3)
Chloride: 99 mmol/L (ref 98–111)
Creatinine, Ser: 5.35 mg/dL — ABNORMAL HIGH (ref 0.61–1.24)
GFR, Estimated: 12 mL/min — ABNORMAL LOW (ref >60)
Glucose, Bld: 113 mg/dL — ABNORMAL HIGH (ref 70–99)
Phosphorus: 5.6 mg/dL — ABNORMAL HIGH (ref 2.5–4.6)
Potassium: 3.7 mmol/L (ref 3.5–5.1)
Sodium: 134 mmol/L — ABNORMAL LOW (ref 135–145)

## 2023-10-03 LAB — CBC
HCT: 30.5 % — ABNORMAL LOW (ref 39.0–52.0)
Hemoglobin: 10.6 g/dL — ABNORMAL LOW (ref 13.0–17.0)
MCH: 32 pg (ref 26.0–34.0)
MCHC: 34.8 g/dL (ref 30.0–36.0)
MCV: 92.1 fL (ref 80.0–100.0)
Platelets: 169 10*3/uL (ref 150–400)
RBC: 3.31 MIL/uL — ABNORMAL LOW (ref 4.22–5.81)
RDW: 13 % (ref 11.5–15.5)
WBC: 8.5 10*3/uL (ref 4.0–10.5)
nRBC: 0 % (ref 0.0–0.2)

## 2023-10-03 MED ORDER — PENTAFLUOROPROP-TETRAFLUOROETH EX AERO
INHALATION_SPRAY | CUTANEOUS | Status: AC
Start: 2023-10-03 — End: ?
  Filled 2023-10-03: qty 30

## 2023-10-03 MED ORDER — HEPARIN SODIUM (PORCINE) 1000 UNIT/ML IJ SOLN
INTRAMUSCULAR | Status: AC
  Filled 2023-10-03: qty 10

## 2023-10-03 NOTE — Progress Notes (Signed)
RN spoke with pt and pt's sister, Charlies Constable multiple times re: transportation home. Plan was for family/friend to transport pt home. Pt was discharged.  Charlies Constable states that family members reported to her that they attempted to pick up pt, but that he did not recognize them, and were told pt was not ready for discharge.   Security did not call floor re: visitors after hours, and the secretary did not witness any family members at nursing station or in pt room speaking with pt. Pt states that no one besides staff had entered room. Pt's sister was updated of same. Sister stated that there was no one else who would be able to get pt tonight.  Charge RN made aware, TOC Consult placed re: transportation needs.

## 2023-10-03 NOTE — Plan of Care (Signed)

## 2023-10-03 NOTE — Progress Notes (Signed)
Patient left 2 belongings bags in room at discharge. This RN notified sister Charlies Constable. Bags at nurses' station in belongings drawer with patient label.

## 2023-10-03 NOTE — Progress Notes (Signed)
Hemodialysis note  Received patient in bed to unit. Alert and oriented.  Informed consent signed and in chart.  Treatment initiated: 0801 Treatment completed: 1209  Patient tolerated well. Transported back to room, alert without acute distress.  Report given to patient's RN.   Access used: Left arm AVF Access issues: none  Total UF removed: 2L Medication(s) given:  Heparin 2300 units bolus  Post HD weight: 89.5 kg   [facility]

## 2023-10-03 NOTE — Progress Notes (Signed)
[facility]  PROGRESS NOTE   Subjective:   Patient seen and evaluated during dialysis   HEMODIALYSIS FLOWSHEET:  Blood Flow Rate (mL/min): 349 mL/min (Simultaneous filing. User may not have seen previous data.) Arterial Pressure (mmHg): -267.66 mmHg (Simultaneous filing. User may not have seen previous data.) Venous Pressure (mmHg): 248.07 mmHg (Simultaneous filing. User may not have seen previous data.) TMP (mmHg): 20 mmHg (Simultaneous filing. User may not have seen previous data.) Ultrafiltration Rate (mL/min): 1024 mL/min (Simultaneous filing. User may not have seen previous data.) Dialysate Flow Rate (mL/min): 300 ml/min (Simultaneous filing. User may not have seen previous data.) Dialysis Fluid Bolus: Normal Saline  Tolerated treatment well  Objective:  Vital signs: Blood pressure (!) 152/80, pulse 68, temperature 97.6 F (36.4 C), temperature source Oral, resp. rate 19, weight 91.1 kg, SpO2 99%.  Intake/Output Summary (Last 24 hours) at 10/03/2023 1002 Last data filed at 10/02/2023 1900 Gross per 24 hour  Intake 240 ml  Output 150 ml  Net 90 ml   Filed Weights   10/01/23 0750 10/01/23 1257 10/03/23 0747  Weight: 91.9 kg 90.4 kg 91.1 kg     Physical Exam: General:  No acute distress  Head:  Normocephalic, atraumatic. Moist oral mucosal membranes  Eyes:  Anicteric  Lungs:   Clear to auscultation, normal effort  Heart:  S1S2 no rubs  Abdomen:   Soft, nontender, bowel sounds present  Extremities:  No peripheral edema.  Neurologic:  Awake, following commands  Skin:  No lesions  Access: Lt AVF    Basic Metabolic Panel: Recent Labs  Lab 09/28/23 0725 09/29/23 0718 09/30/23 0546 10/01/23 0752 10/02/23 1141 10/03/23 0756  NA 139  --  133* 134* 134* 134*  K 4.4 3.2* 4.2 3.8 4.1 3.7  CL 104  --  97* 98 98 99  CO2 22  --  26 27 28 24   GLUCOSE 198*  --  92 93 99 113*  BUN 95*  --  73* 54* 43* 54*  CREATININE 7.03*  --  5.60* 4.53* 4.55* 5.35*   CALCIUM 8.0*  --  7.8* 7.8* 8.3* 7.7*  MG  --  2.0  --   --   --   --   PHOS  --   --  6.0* 4.0  --  5.6*   GFR: Estimated Creatinine Clearance: 16.4 mL/min (A) (by C-G formula based on SCr of 5.35 mg/dL (H)).  Liver Function Tests: Recent Labs  Lab 09/27/23 2251 09/30/23 0546 10/01/23 0752 10/02/23 1141 10/03/23 0756  AST 19  --   --  14*  --   ALT 23  --   --  17  --   ALKPHOS 82  --   --  61  --   BILITOT 1.0  --   --  0.7  --   PROT 6.8  --   --  6.5  --   ALBUMIN 3.6 3.2* 3.0* 3.2* 3.1*   No results for input(s): "LIPASE", "AMYLASE" in the last 168 hours. No results for input(s): "AMMONIA" in the last 168 hours.  CBC: Recent Labs  Lab 09/27/23 2036 09/28/23 0725 09/29/23 0718 09/30/23 0546 10/03/23 0308  WBC 8.3 8.4 13.0* 10.2 8.5  HGB 10.0* 9.5* 9.5* 10.4* 10.6*  HCT 30.6* 29.2* 28.6* 31.8* 30.5*  MCV 99.4 98.0 95.0 96.1 92.1  PLT 201 205 203 204 169     HbA1C: HB A1C (BAYER DCA - WAIVED)  Date/Time Value Ref Range Status  04/17/2016 08:25 AM  5.6 <7.0 % Final    Comment:                                          Diabetic Adult            <7.0                                       Healthy Adult        4.3 - 5.7                                                           (DCCT/NGSP) American Diabetes Association's Summary of Glycemic Recommendations for Adults with Diabetes: Hemoglobin A1c <7.0%. More stringent glycemic goals (A1c <6.0%) may further reduce complications at the cost of increased risk of hypoglycemia.    Hgb A1c MFr Bld  Date/Time Value Ref Range Status  06/10/2023 02:31 PM 5.5 4.8 - 5.6 % Final    Comment:             Prediabetes: 5.7 - 6.4          Diabetes: >6.4          Glycemic control for adults with diabetes: <7.0   01/07/2023 04:10 PM 6.3 (H) 4.8 - 5.6 % Final    Comment:             Prediabetes: 5.7 - 6.4          Diabetes: >6.4          Glycemic control for adults with diabetes: <7.0     Urinalysis: No results for  input(s): "COLORURINE", "LABSPEC", "PHURINE", "GLUCOSEU", "HGBUR", "BILIRUBINUR", "KETONESUR", "PROTEINUR", "UROBILINOGEN", "NITRITE", "LEUKOCYTESUR" in the last 72 hours.  Invalid input(s): "APPERANCEUR"    Imaging: No results found.   Medications:      amLODipine  10 mg Oral Daily   aspirin EC  81 mg Oral Daily   carvedilol  6.25 mg Oral BID WC   Chlorhexidine Gluconate Cloth  6 each Topical Q0600   DULoxetine  60 mg Oral Daily   ezetimibe  10 mg Oral Daily   fluticasone furoate-vilanterol  1 puff Inhalation Daily   And   umeclidinium bromide  1 puff Inhalation Daily   heparin injection (subcutaneous)  5,000 Units Subcutaneous Q8H   lidocaine  1 patch Transdermal Q24H   losartan  100 mg Oral Daily   nicotine  21 mg Transdermal Daily   pantoprazole  40 mg Oral Daily    Assessment/ Plan:     57 year old male with history of hypertension, coronary artery disease, congestive heart failure, hyperlipidemia, peripheral vascular disease, diabetes, GERD, asthma, end-stage renal disease on dialysis was admitted through the emergency room with onset of dry cough, dyspnea associated with wheezing.  He had been on dialysis and has been noncompliant with his dialysis treatments.  He missed 3 treatments as per the family.  He also had NSTEMI and is on heparin drip at this time.   [facility]/TTS/Lt AVF   ESRD: Receiving treatment today with UF goal 1.5-2L as tolerated. Next treatment scheduled for Saturday.  ANEMIA  with chronic kidney disease: Patient receives Mircera at outpatient clinic.Hgb 10.6, acceptable.   Secondary hyperparathyroidism. MBD: Calcium slightly decreased. Will continue to monitor bone minerals.    HTN with chronic kidney disease: Now on amlodipine, carvedilol and losartan.   Blood pressure 158/79 during dialysis   Diabetes mellitus type II with chronic kidney disease/renal manifestations: noninsulin dependent. Diet controlled     LOS: 6 [facility] 10/24/202410:02 AM

## 2023-10-03 NOTE — Plan of Care (Signed)

## 2023-10-03 NOTE — Progress Notes (Signed)
PROGRESS NOTE    Derek Cooley.  NWG:[id number] DOB: January 20, 1966 DOA: 09/27/2023 PCP: Larae Grooms, NP    Brief Narrative:   57 year old male history significant for ESRD on HD, CHF, hypertension, hyperlipidemia, chronic hypoxic respiratory failure who presents to the ED with progressively worsening dyspnea associated with cough and wheeze   Patient has a long history of medication and treatment nonadherence.  Frequently misses dialysis.  Patient states he missed his last 3 dialysis sessions.  He is markedly hypertensive with systolics greater than 200.  Currently on nitroglycerin drip.  10/20: Blood pressure improved.  Weaned off nitroglycerin drip.  Stable for transfer to general medical service.   Assessment & Plan:   Principal Problem:   NSTEMI (non-ST elevated myocardial infarction) (HCC) Active Problems:   Acute on chronic diastolic heart failure (HCC)   End-stage renal disease on hemodialysis (HCC)   Essential hypertension   Hypertensive emergency   Type 2 diabetes mellitus with chronic kidney disease, without long-term current use of insulin (HCC)   Heart failure with preserved ejection fraction (HCC)   GERD without esophagitis   Anxiety and depression   Smoking   NSVT (nonsustained ventricular tachycardia) (HCC)   Hypertensive urgency  ESRD on HD Hypertensive urgency Patient initially required admission to stepdown unit and nitroglycerin gtt.  Received stat dialysis on 10/19.  Blood pressure control now much improved.  Restarted on home medications. Plan: Continue metoprolol, amlodipine, losartan IV hydralazine as needed Nephrology following for HD, next session tomorrow Plan for d/c today  Somnolence This morning, secondary to benzos and opioids ordered by prior MD. Have discontinued those meds and mental status returned to baseline by this afternoon.  Elevated troponin Suspect supply/demand ischemia in the setting of hypertensive urgency and  ESRD Echo normal ejection fraction Plan: Heparin drip x 48 hours, completed No plans for ischemic evaluation EKG as needed chest pain  Acute heart failure with preserved ejection fraction In the setting of multiple missed dialysis sessions Volume management with dialysis, have resumed tts schedule, next due tomorrow  Type 2 diabetes mellitus with CKD Last hemoglobin A1c 5.5 in July 2024 No indication for carb modified diet No indication for CBGs  Chronic hypoxic respiratory failure Patient on baseline 3 L oxygen, stable  History of drug use     DVT prophylaxis: SQ heparin Code Status: Full Family Communication: sister updated telephonically Disposition Plan: Status is: Inpatient Plan to d/c this afternoon   Level of care: Telemetry Medical  Consultants:  Nephrology Cardiology  Procedures:  None  Antimicrobials: None   Subjective: Seen and.  Confused this morning, a bit somnolent. Resolved this afternoon  Objective: Vitals:   10/03/23 1200 10/03/23 1209 10/03/23 1216 10/03/23 1349  BP: 128/76 113/69  (!) 156/98  Pulse: 67 72  73  Resp: 17 19    Temp:  98.4 F (36.9 C)    TempSrc:  Oral    SpO2: 98% 99%  99%  Weight:   89.5 kg     Intake/Output Summary (Last 24 hours) at 10/03/2023 1408 Last data filed at 10/03/2023 1209 Gross per 24 hour  Intake 240 ml  Output 2000 ml  Net -1760 ml   Filed Weights   10/01/23 1257 10/03/23 0747 10/03/23 1216  Weight: 90.4 kg 91.1 kg 89.5 kg    Examination:  General exam: Appears chronically ill Respiratory system: Bibasilar crackles.  Normal work of breathing.  3 L Cardiovascular system: S1-S2, RRR, no murmurs, no pedal edema Gastrointestinal system: Soft, NT/ND,  normal bowel sounds Central nervous system: somnolent, moving all 4 Extremities: Symmetric 5 x 5 power. Skin: No rashes, lesions or ulcers Psychiatry: depressed affect    Data Reviewed: I have personally reviewed following labs and imaging  studies  CBC: Recent Labs  Lab 09/27/23 2036 09/28/23 0725 09/29/23 0718 09/30/23 0546 10/03/23 0308  WBC 8.3 8.4 13.0* 10.2 8.5  HGB 10.0* 9.5* 9.5* 10.4* 10.6*  HCT 30.6* 29.2* 28.6* 31.8* 30.5*  MCV 99.4 98.0 95.0 96.1 92.1  PLT 201 205 203 204 169   Basic Metabolic Panel: Recent Labs  Lab 09/28/23 0725 09/29/23 0718 09/30/23 0546 10/01/23 0752 10/02/23 1141 10/03/23 0756  NA 139  --  133* 134* 134* 134*  K 4.4 3.2* 4.2 3.8 4.1 3.7  CL 104  --  97* 98 98 99  CO2 22  --  26 27 28 24   GLUCOSE 198*  --  92 93 99 113*  BUN 95*  --  73* 54* 43* 54*  CREATININE 7.03*  --  5.60* 4.53* 4.55* 5.35*  CALCIUM 8.0*  --  7.8* 7.8* 8.3* 7.7*  MG  --  2.0  --   --   --   --   PHOS  --   --  6.0* 4.0  --  5.6*   GFR: Estimated Creatinine Clearance: 16.3 mL/min (A) (by C-G formula based on SCr of 5.35 mg/dL (H)). Liver Function Tests: Recent Labs  Lab 09/27/23 2251 09/30/23 0546 10/01/23 0752 10/02/23 1141 10/03/23 0756  AST 19  --   --  14*  --   ALT 23  --   --  17  --   ALKPHOS 82  --   --  61  --   BILITOT 1.0  --   --  0.7  --   PROT 6.8  --   --  6.5  --   ALBUMIN 3.6 3.2* 3.0* 3.2* 3.1*   No results for input(s): "LIPASE", "AMYLASE" in the last 168 hours. No results for input(s): "AMMONIA" in the last 168 hours. Coagulation Profile: Recent Labs  Lab 09/28/23 0045  INR 1.2   Cardiac Enzymes: No results for input(s): "CKTOTAL", "CKMB", "CKMBINDEX", "TROPONINI" in the last 168 hours. BNP (last 3 results) No results for input(s): "PROBNP" in the last 8760 hours. HbA1C: No results for input(s): "HGBA1C" in the last 72 hours. CBG: Recent Labs  Lab 09/28/23 1337 09/28/23 1645 09/28/23 2157 09/29/23 0716 09/29/23 1112  GLUCAP 167* 115* 140* 153* 115*   Lipid Profile: No results for input(s): "CHOL", "HDL", "LDLCALC", "TRIG", "CHOLHDL", "LDLDIRECT" in the last 72 hours.  Thyroid Function Tests: No results for input(s): "TSH", "T4TOTAL", "FREET4",  "T3FREE", "THYROIDAB" in the last 72 hours. Anemia Panel: No results for input(s): "VITAMINB12", "FOLATE", "FERRITIN", "TIBC", "IRON", "RETICCTPCT" in the last 72 hours. Sepsis Labs: No results for input(s): "PROCALCITON", "LATICACIDVEN" in the last 168 hours.  Recent Results (from the past 240 hour(s))  MRSA Next Gen by PCR, Nasal     Status: None   Collection Time: 09/28/23  8:43 AM   Specimen: Nasal Mucosa; Nasal Swab  Result Value Ref Range Status   MRSA by PCR Next Gen NOT DETECTED NOT DETECTED Final    Comment: (NOTE) The GeneXpert MRSA Assay (FDA approved for NASAL specimens only), is one component of a comprehensive MRSA colonization surveillance program. It is not intended to diagnose MRSA infection nor to guide or monitor treatment for MRSA infections. Test performance is not FDA approved in patients  less than 62 years old. Performed at [facility], [address] [place], [place] [address]          Radiology Studies: No results found.      Scheduled Meds:  amLODipine  10 mg Oral Daily   aspirin EC  81 mg Oral Daily   carvedilol  6.25 mg Oral BID WC   Chlorhexidine Gluconate Cloth  6 each Topical Q0600   DULoxetine  60 mg Oral Daily   ezetimibe  10 mg Oral Daily   fluticasone furoate-vilanterol  1 puff Inhalation Daily   And   umeclidinium bromide  1 puff Inhalation Daily   heparin injection (subcutaneous)  5,000 Units Subcutaneous Q8H   lidocaine  1 patch Transdermal Q24H   losartan  100 mg Oral Daily   nicotine  21 mg Transdermal Daily   pantoprazole  40 mg Oral Daily   Continuous Infusions:     LOS: 6 days       Silvano Bilis, MD [facility]   If 7PM-7AM, please contact night-coverage  10/03/2023, 2:08 PM

## 2023-10-04 ENCOUNTER — Telehealth: Payer: Self-pay

## 2023-10-04 NOTE — Transitions of Care (Post Inpatient/ED Visit) (Signed)
10/04/2023  Name: Derek Cooley. MRN: [id number] DOB: 1966/11/09  Today's TOC FU Call Status: Today's TOC FU Call Status:: Successful TOC FU Call Completed TOC FU Call Complete Date: 10/04/23 Patient's Name and Date of Birth confirmed.  Transition Care Management Follow-up Telephone Call Date of Discharge: 10/03/23 Discharge Facility: [facility]) Type of Discharge: Inpatient Admission Primary Inpatient Discharge Diagnosis:: NSTEMI How have you been since you were released from the hospital?: Better Any questions or concerns?: No  Items Reviewed: Did you receive and understand the discharge instructions provided?: Yes Medications obtained,verified, and reconciled?: Yes (Medications Reviewed) Any new allergies since your discharge?: No Dietary orders reviewed?: NA Do you have support at home?: No  Medications Reviewed Today: Medications Reviewed Today     Reviewed by Redge Gainer, RN (Case Manager) on 10/04/23 at 1327  Med List Status: <None>   Medication Order Taking? Sig Documenting Provider Last Dose Status Informant  albuterol (VENTOLIN HFA) 108 (90 Base) MCG/ACT inhaler [id number] No Inhale 1-2 puffs into the lungs every 6 (six) hours as needed for wheezing or shortness of breath. Loyce Dys, MD prn prn Active Family Member  amLODipine (NORVASC) 10 MG tablet [id number] No Take 10 mg by mouth daily. [provider] Past Month unknown Active Family Member  aspirin EC 81 MG tablet [id number]  Take 1 tablet (81 mg total) by mouth daily. Swallow whole. Tresa Moore, MD  Active   carvedilol (COREG) 6.25 MG tablet [id number]  Take 1 tablet (6.25 mg total) by mouth 2 (two) times daily with a meal. Georgeann Oppenheim, Sudheer B, MD  Active   DULoxetine (CYMBALTA) 60 MG capsule [id number]  Take 1 capsule (60 mg total) by mouth daily. Tresa Moore, MD  Active   ezetimibe (ZETIA) 10 MG tablet [id number]  Take 1 tablet (10 mg total) by  mouth daily. Tresa Moore, MD  Active   lidocaine-prilocaine (EMLA) cream [id number] No Apply 1 Application topically daily. [provider] prn prn Active Family Member  losartan (COZAAR) 100 MG tablet [id number] No Take 100 mg by mouth daily. [provider] Past Month unknown Active Family Member  oxyCODONE (OXY IR/ROXICODONE) 5 MG immediate release tablet [id number]  Take 1 tablet (5 mg total) by mouth every 4 (four) hours as needed for severe pain (pain score 7-10). Tresa Moore, MD  Active   OXYGEN [id number] No Inhale 3 L into the lungs daily. [provider] Taking Active Family Member  pantoprazole (PROTONIX) 40 MG tablet [id number] No TAKE 1 TABLET(40 MG) BY MOUTH TWICE DAILY AS NEEDED Larae Grooms, NP Past Month unknown Active Family Member  topiramate (TOPAMAX) 50 MG tablet [id number] No TAKE 1 TABLET(50 MG) BY MOUTH TWICE DAILY Larae Grooms, NP Past Month unknown Active Family Member  TRELEGY ELLIPTA 100-62.5-25 MCG/ACT AEPB [id number] No Inhale 1 puff into the lungs daily. Mecum, Oswaldo Conroy, PA-C Past Month unknown Active Family Member            Home Care and Equipment/Supplies: Were Home Health Services Ordered?: NA Any new equipment or medical supplies ordered?: NA  Functional Questionnaire: Do you need assistance with bathing/showering or dressing?: No Do you need assistance with meal preparation?: No Do you need assistance with eating?: No Do you have difficulty maintaining continence: No Do you need assistance with getting out of bed/getting out of a chair/moving?: No Do you have difficulty managing or taking your medications?: No  Follow up appointments reviewed: PCP Follow-up appointment confirmed?:  Yes Date of PCP follow-up appointment?: 10/16/23 Follow-up Provider: Larae Grooms Specialist [facility] Follow-up appointment confirmed?: Yes Date of Specialist follow-up appointment?: 10/25/23 Follow-Up Specialty Provider::  Sherri Hammock Do you need transportation to your follow-up appointment?: No Do you understand care options if your condition(s) worsen?: Yes-patient verbalized understanding  SDOH Interventions Today    Flowsheet Row Most Recent Value  SDOH Interventions   Food Insecurity Interventions Intervention Not Indicated  Transportation Interventions Intervention Not Indicated      Talked with the patient's sister. 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 The patient is a poor historian and has some confusion. He refused his HD treatments. He utilizes the ER every couple of weeks when he stops going to dialysis. The patient's family tried for guardianship but the judge ruled that the patient is capable of making his own decisions. He also has polysubstance abuse. The patient's sister declines the 30 day Outreach program since the patient is non-compliant and doesn't think it will do much good.    Deidre Ala, RN Medical illustrator [facility] [phone]

## 2023-10-15 NOTE — Progress Notes (Deleted)
There were no vitals taken for this visit.   Subjective:    Patient ID: Derek Cooley., male    DOB: 1966-11-09, 57 y.o.   MRN: [id number]  HPI: Derek Cooley. is a 57 y.o. male  No chief complaint on file.  Transition of Care Hospital Follow up.   Hospital/Facility: [facility] D/C Physician: Dr. Ashok Pall D/C Date: 10/03/23  Records Requested: NA Records Received: Yes Records Reviewed: Yes  Diagnoses on Discharge:   NSTEMI (non-ST elevated myocardial infarction) [facility]) - The patient will be admitted to stepdown unit bed. - Will follow serial troponins and EKGs. - The patient will be placed on aspirin as well as p.r.n. sublingual nitroglycerin and morphine sulfate for pain. - We will continue IV heparin. - We will obtain a cardiology consult in a.m. for further cardiac risk stratification. - I notified Dr. Elberta Fortis about the patient     End-stage renal disease on hemodialysis (HCC) - She has a BNP and clinically manifest mild fluid overload. - Nephrology consult will be obtained for follow-up on hemodialysis. - I notified Dr. Micki Riley about the patient. - The patient was given a dose of IV Lasix.   Essential hypertension - We will continue antihypertensive therapy.   Type 2 diabetes mellitus with chronic kidney disease, without long-term current use of insulin (HCC) - The patient be placed on supplemental coverage with NovoLog.   Anxiety and depression - We will continue Cymbalta and Topamax.   GERD without esophagitis - Will continue PPI.  Date of interactive Contact within 48 hours of discharge:  Contact was through: phone  Date of 7 day or 14 day face-to-face visit:    within 14 days  Outpatient Encounter Medications as of 10/16/2023  Medication Sig   albuterol (VENTOLIN HFA) 108 (90 Base) MCG/ACT inhaler Inhale 1-2 puffs into the lungs every 6 (six) hours as needed for wheezing or shortness of breath.   amLODipine (NORVASC) 10 MG tablet Take 10 mg by mouth  daily.   aspirin EC 81 MG tablet Take 1 tablet (81 mg total) by mouth daily. Swallow whole.   carvedilol (COREG) 6.25 MG tablet Take 1 tablet (6.25 mg total) by mouth 2 (two) times daily with a meal.   DULoxetine (CYMBALTA) 60 MG capsule Take 1 capsule (60 mg total) by mouth daily.   ezetimibe (ZETIA) 10 MG tablet Take 1 tablet (10 mg total) by mouth daily.   lidocaine-prilocaine (EMLA) cream Apply 1 Application topically daily.   losartan (COZAAR) 100 MG tablet Take 100 mg by mouth daily.   oxyCODONE (OXY IR/ROXICODONE) 5 MG immediate release tablet Take 1 tablet (5 mg total) by mouth every 4 (four) hours as needed for severe pain (pain score 7-10).   OXYGEN Inhale 3 L into the lungs daily.   pantoprazole (PROTONIX) 40 MG tablet TAKE 1 TABLET(40 MG) BY MOUTH TWICE DAILY AS NEEDED   topiramate (TOPAMAX) 50 MG tablet TAKE 1 TABLET(50 MG) BY MOUTH TWICE DAILY   TRELEGY ELLIPTA 100-62.5-25 MCG/ACT AEPB Inhale 1 puff into the lungs daily.   No facility-administered encounter medications on file as of 10/16/2023.    Diagnostic Tests Reviewed/Disposition: Reviewed  Consults: Cardiology and Nephrology  Discharge Instructions: Reviewed during patient during visit  Disease/illness Education: Provided  Home Health/Community Services Discussions/Referrals: Discussed during visit  Establishment or re-establishment of referral orders for community resources: Resume dialysis  Discussion with other health care providers: NA  Assessment and Support of treatment regimen adherence: discussed during visit  Appointments Coordinated with: Patient's sister  Education for self-management, independent living, and ADLs: reviewed  Relevant past medical, surgical, family and social history reviewed and updated as indicated. Interim medical history since our last visit reviewed. Allergies and medications reviewed and updated.  Review of Systems  Per HPI unless specifically indicated above      Objective:    There were no vitals taken for this visit.  Wt Readings from Last 3 Encounters:  10/03/23 197 lb 5 oz (89.5 kg)  09/18/23 207 lb 12.8 oz (94.3 kg)  09/09/23 221 lb 3.2 oz (100.3 kg)    Physical Exam  Results for orders placed or performed during the hospital encounter of 09/27/23  MRSA Next Gen by PCR, Nasal   Specimen: Nasal Mucosa; Nasal Swab  Result Value Ref Range   MRSA by PCR Next Gen NOT DETECTED NOT DETECTED  Basic metabolic panel  Result Value Ref Range   Sodium 142 135 - 145 mmol/L   Potassium 4.0 3.5 - 5.1 mmol/L   Chloride 106 98 - 111 mmol/L   CO2 24 22 - 32 mmol/L   Glucose, Bld 138 (H) 70 - 99 mg/dL   BUN 96 (H) 6 - 20 mg/dL   Creatinine, Ser 8.29 (H) 0.61 - 1.24 mg/dL   Calcium 8.1 (L) 8.9 - 10.3 mg/dL   GFR, Estimated 9 (L) >60 mL/min   Anion gap 12 5 - 15  CBC  Result Value Ref Range   WBC 8.3 4.0 - 10.5 K/uL   RBC 3.08 (L) 4.22 - 5.81 MIL/uL   Hemoglobin 10.0 (L) 13.0 - 17.0 g/dL   HCT 56.2 (L) 13.0 - 86.5 %   MCV 99.4 80.0 - 100.0 fL   MCH 32.5 26.0 - 34.0 pg   MCHC 32.7 30.0 - 36.0 g/dL   RDW 78.4 69.6 - 29.5 %   Platelets 201 150 - 400 K/uL   nRBC 0.0 0.0 - 0.2 %  Brain natriuretic peptide  Result Value Ref Range   B Natriuretic Peptide 573.4 (H) 0.0 - 100.0 pg/mL  Hepatic function panel  Result Value Ref Range   Total Protein 6.8 6.5 - 8.1 g/dL   Albumin 3.6 3.5 - 5.0 g/dL   AST 19 15 - 41 U/L   ALT 23 0 - 44 U/L   Alkaline Phosphatase 82 38 - 126 U/L   Total Bilirubin 1.0 0.3 - 1.2 mg/dL   Bilirubin, Direct 0.1 0.0 - 0.2 mg/dL   Indirect Bilirubin 0.9 0.3 - 0.9 mg/dL  Protime-INR  Result Value Ref Range   Prothrombin Time 15.5 (H) 11.4 - 15.2 seconds   INR 1.2 0.8 - 1.2  APTT  Result Value Ref Range   aPTT >200 (HH) 24 - 36 seconds  Heparin level (unfractionated)  Result Value Ref Range   Heparin Unfractionated <0.10 (L) 0.30 - 0.70 IU/mL  Lipoprotein A (LPA)  Result Value Ref Range   Lipoprotein (a) <8.4 <75.0  nmol/L  Lipid panel  Result Value Ref Range   Cholesterol 149 0 - 200 mg/dL   Triglycerides 69 <284 mg/dL   HDL 51 >13 mg/dL   Total CHOL/HDL Ratio 2.9 RATIO   VLDL 14 0 - 40 mg/dL   LDL Cholesterol 84 0 - 99 mg/dL  CBC  Result Value Ref Range   WBC 8.4 4.0 - 10.5 K/uL   RBC 2.98 (L) 4.22 - 5.81 MIL/uL   Hemoglobin 9.5 (L) 13.0 - 17.0 g/dL   HCT 24.4 (L) 01.0 -  52.0 %   MCV 98.0 80.0 - 100.0 fL   MCH 31.9 26.0 - 34.0 pg   MCHC 32.5 30.0 - 36.0 g/dL   RDW 40.1 02.7 - 25.3 %   Platelets 205 150 - 400 K/uL   nRBC 0.0 0.0 - 0.2 %  Basic metabolic panel  Result Value Ref Range   Sodium 139 135 - 145 mmol/L   Potassium 4.4 3.5 - 5.1 mmol/L   Chloride 104 98 - 111 mmol/L   CO2 22 22 - 32 mmol/L   Glucose, Bld 198 (H) 70 - 99 mg/dL   BUN 95 (H) 6 - 20 mg/dL   Creatinine, Ser 6.64 (H) 0.61 - 1.24 mg/dL   Calcium 8.0 (L) 8.9 - 10.3 mg/dL   GFR, Estimated 8 (L) >60 mL/min   Anion gap 13 5 - 15  Hepatitis B surface antigen  Result Value Ref Range   Hepatitis B Surface Ag NON REACTIVE NON REACTIVE  Hepatitis B surface antibody,quantitative  Result Value Ref Range   Hep B S AB Quant (Post) <3.5 (L) Immunity>10 mIU/mL  Glucose, capillary  Result Value Ref Range   Glucose-Capillary 200 (H) 70 - 99 mg/dL  Heparin level (unfractionated)  Result Value Ref Range   Heparin Unfractionated 0.13 (L) 0.30 - 0.70 IU/mL  Glucose, capillary  Result Value Ref Range   Glucose-Capillary 167 (H) 70 - 99 mg/dL   Comment 1 Notify RN   Glucose, capillary  Result Value Ref Range   Glucose-Capillary 115 (H) 70 - 99 mg/dL   Comment 1 Notify RN   CBC  Result Value Ref Range   WBC 13.0 (H) 4.0 - 10.5 K/uL   RBC 3.01 (L) 4.22 - 5.81 MIL/uL   Hemoglobin 9.5 (L) 13.0 - 17.0 g/dL   HCT 40.3 (L) 47.4 - 25.9 %   MCV 95.0 80.0 - 100.0 fL   MCH 31.6 26.0 - 34.0 pg   MCHC 33.2 30.0 - 36.0 g/dL   RDW 56.3 87.5 - 64.3 %   Platelets 203 150 - 400 K/uL   nRBC 0.0 0.0 - 0.2 %  Heparin level  (unfractionated)  Result Value Ref Range   Heparin Unfractionated 0.35 0.30 - 0.70 IU/mL  Glucose, capillary  Result Value Ref Range   Glucose-Capillary 140 (H) 70 - 99 mg/dL  Potassium  Result Value Ref Range   Potassium 3.2 (L) 3.5 - 5.1 mmol/L  Magnesium  Result Value Ref Range   Magnesium 2.0 1.7 - 2.4 mg/dL  Glucose, capillary  Result Value Ref Range   Glucose-Capillary 153 (H) 70 - 99 mg/dL  Heparin level (unfractionated)  Result Value Ref Range   Heparin Unfractionated 0.21 (L) 0.30 - 0.70 IU/mL  Glucose, capillary  Result Value Ref Range   Glucose-Capillary 115 (H) 70 - 99 mg/dL  Heparin level (unfractionated)  Result Value Ref Range   Heparin Unfractionated 0.26 (L) 0.30 - 0.70 IU/mL  CBC  Result Value Ref Range   WBC 10.2 4.0 - 10.5 K/uL   RBC 3.31 (L) 4.22 - 5.81 MIL/uL   Hemoglobin 10.4 (L) 13.0 - 17.0 g/dL   HCT 32.9 (L) 51.8 - 84.1 %   MCV 96.1 80.0 - 100.0 fL   MCH 31.4 26.0 - 34.0 pg   MCHC 32.7 30.0 - 36.0 g/dL   RDW 66.0 63.0 - 16.0 %   Platelets 204 150 - 400 K/uL   nRBC 0.0 0.0 - 0.2 %  Renal function panel  Result Value Ref Range  Sodium 133 (L) 135 - 145 mmol/L   Potassium 4.2 3.5 - 5.1 mmol/L   Chloride 97 (L) 98 - 111 mmol/L   CO2 26 22 - 32 mmol/L   Glucose, Bld 92 70 - 99 mg/dL   BUN 73 (H) 6 - 20 mg/dL   Creatinine, Ser 7.42 (H) 0.61 - 1.24 mg/dL   Calcium 7.8 (L) 8.9 - 10.3 mg/dL   Phosphorus 6.0 (H) 2.5 - 4.6 mg/dL   Albumin 3.2 (L) 3.5 - 5.0 g/dL   GFR, Estimated 11 (L) >60 mL/min   Anion gap 10 5 - 15  Renal function panel  Result Value Ref Range   Sodium 134 (L) 135 - 145 mmol/L   Potassium 3.8 3.5 - 5.1 mmol/L   Chloride 98 98 - 111 mmol/L   CO2 27 22 - 32 mmol/L   Glucose, Bld 93 70 - 99 mg/dL   BUN 54 (H) 6 - 20 mg/dL   Creatinine, Ser 5.95 (H) 0.61 - 1.24 mg/dL   Calcium 7.8 (L) 8.9 - 10.3 mg/dL   Phosphorus 4.0 2.5 - 4.6 mg/dL   Albumin 3.0 (L) 3.5 - 5.0 g/dL   GFR, Estimated 14 (L) >60 mL/min   Anion gap 9 5 - 15   Comprehensive metabolic panel  Result Value Ref Range   Sodium 134 (L) 135 - 145 mmol/L   Potassium 4.1 3.5 - 5.1 mmol/L   Chloride 98 98 - 111 mmol/L   CO2 28 22 - 32 mmol/L   Glucose, Bld 99 70 - 99 mg/dL   BUN 43 (H) 6 - 20 mg/dL   Creatinine, Ser 6.38 (H) 0.61 - 1.24 mg/dL   Calcium 8.3 (L) 8.9 - 10.3 mg/dL   Total Protein 6.5 6.5 - 8.1 g/dL   Albumin 3.2 (L) 3.5 - 5.0 g/dL   AST 14 (L) 15 - 41 U/L   ALT 17 0 - 44 U/L   Alkaline Phosphatase 61 38 - 126 U/L   Total Bilirubin 0.7 0.3 - 1.2 mg/dL   GFR, Estimated 14 (L) >60 mL/min   Anion gap 8 5 - 15  Blood gas, venous  Result Value Ref Range   pH, Ven 7.34 7.25 - 7.43   pCO2, Ven 57 44 - 60 mmHg   pO2, Ven 54 (H) 32 - 45 mmHg   Bicarbonate 30.8 (H) 20.0 - 28.0 mmol/L   Acid-Base Excess 3.5 (H) 0.0 - 2.0 mmol/L   O2 Saturation 85.2 %   Patient temperature 37.0    Collection site VENOUS   CBC  Result Value Ref Range   WBC 8.5 4.0 - 10.5 K/uL   RBC 3.31 (L) 4.22 - 5.81 MIL/uL   Hemoglobin 10.6 (L) 13.0 - 17.0 g/dL   HCT 75.6 (L) 43.3 - 29.5 %   MCV 92.1 80.0 - 100.0 fL   MCH 32.0 26.0 - 34.0 pg   MCHC 34.8 30.0 - 36.0 g/dL   RDW 18.8 41.6 - 60.6 %   Platelets 169 150 - 400 K/uL   nRBC 0.0 0.0 - 0.2 %  Renal function panel  Result Value Ref Range   Sodium 134 (L) 135 - 145 mmol/L   Potassium 3.7 3.5 - 5.1 mmol/L   Chloride 99 98 - 111 mmol/L   CO2 24 22 - 32 mmol/L   Glucose, Bld 113 (H) 70 - 99 mg/dL   BUN 54 (H) 6 - 20 mg/dL   Creatinine, Ser 3.01 (H) 0.61 - 1.24 mg/dL  Calcium 7.7 (L) 8.9 - 10.3 mg/dL   Phosphorus 5.6 (H) 2.5 - 4.6 mg/dL   Albumin 3.1 (L) 3.5 - 5.0 g/dL   GFR, Estimated 12 (L) >60 mL/min   Anion gap 11 5 - 15  ECHOCARDIOGRAM COMPLETE  Result Value Ref Range   Weight 3,343.94 oz   BP 172/78 mmHg   Ao pk vel 1.71 m/s   AV Area VTI 3.30 cm2   AR max vel 2.69 cm2   AV Mean grad 5.0 mmHg   AV Peak grad 11.7 mmHg   S' Lateral 3.30 cm   AV Area mean vel 3.13 cm2   Area-P 1/2 3.13 cm2    MV VTI 2.69 cm2   Est EF 60 - 65%   Troponin I (High Sensitivity)  Result Value Ref Range   Troponin I (High Sensitivity) 553 (HH) <18 ng/L  Troponin I (High Sensitivity)  Result Value Ref Range   Troponin I (High Sensitivity) 337 (HH) <18 ng/L      Assessment & Plan:   Problem List Items Addressed This Visit   None    Follow up plan: No follow-ups on file.

## 2023-10-16 ENCOUNTER — Inpatient Hospital Stay: Payer: Medicare Other | Admitting: Nurse Practitioner

## 2023-10-16 ENCOUNTER — Ambulatory Visit: Payer: Medicare Other | Admitting: Physician Assistant

## 2023-10-16 DIAGNOSIS — Z09 Encounter for follow-up examination after completed treatment for conditions other than malignant neoplasm: Secondary | ICD-10-CM

## 2023-10-25 ENCOUNTER — Ambulatory Visit: Payer: Medicare Other | Attending: Cardiology | Admitting: Cardiology

## 2023-10-25 NOTE — Progress Notes (Deleted)
Cardiology Office Note:  .   Date:  10/25/2023  ID:  Derek Cooley., DOB May 01, 1966, MRN [id number] PCP: Larae Grooms, NP  [facility] Providers Cardiologist:  Lorine Bears, MD { Click to update primary MD,subspecialty MD or APP then REFRESH:1}   History of Present Illness: .   Derek Cooley. is a 57 y.o. male with a past medical history of hypertension, diabetes, subarachnoid hemorrhage status postcraniotomy and clipping, sleep apnea, obesity, end-stage renal disease on hemodialysis, abdominal abscess with chronic MRSA infection, polysubstance abuse, noncompliance, COPD with continued tobacco abuse, who is here today after recent hospitalization for follow-up.   He originally presented to [facility] in December 2022 with progressive dizziness found to be hypertensive urgency.  Labs revealed normocytic anemia mild troponin elevation with a peak of 18.  Echo at that time revealed normal LVEF with moderate LVH and G1 DD.  He was discharged home on amlodipine and carvedilol with recommendations for outpatient stress testing.  Readmitted to [facility] on January 16 in the setting of increased somnolence.  He was found to have rhinovirus with worsening AKI.  He had been noncompliant with his medications at home and therefore resumed on admission.  ER visit January 27 with headaches and elevated BP.  Labs showed stable CKD.  MRI was unremarkable and he was discharged home.  He was admitted 7/15 - 07/02/2022 for shortness of breath and lower extremity edema along with elevated blood pressure.  Chest x-ray revealed vascular congestion with interstitial edema.  Troponins were mildly elevated.  Home BP meds were restarted and he was diuresed with IV Lasix.  Lexiscan Myoview showed low risk with no ischemia.  Nephrology decided to initiate dialysis during hospitalization so permacath was placed.  He was sent home on torsemide 40 mg for nondialysis days.  He was hospitalized again 9/28 - 09/09/2022 after  presenting to the emergency department following accidental fall in which she tripped on the sidewalk twisting his right leg and hitting his left shoulder.  He denied any head injury or loss of consciousness.  He was found to be hypoxic with increased oxygen requirement.  He was confused and desaturated to 71% but his COVID was negative.  He was evaluated by the trauma team and all imaging were negative.  CT of the chest was ordered to rule out PE but was unable to be obtained without nephrology consultation.  Urine drug screen was positive for cocaine and amphetamines.  VQ scan completed revealed low probability of PE.  Nephrology was consulted for continuation of hemodialysis.  He was admitted again 12/14 - 11/24/2022 presented for shortness of breath for 2 to 3 days and ambulatory dysfunction.  He felt short winded and did not go to his dialysis sessions.  He was sent to complaining of was placed on 2 L of O2 via nasal cannula.  High-sensitivity troponin peaked at 149 and BNP was elevated at 625.  COVID, influenza A and influenza B/RSV PCR was negative.  Nephrology was consulted for emergency dialysis.  He was given aspirin and Lasix as well as DuoNeb's and was admitted to the hospital for further evaluation and treatment.  Admitted again 02/2023 with shortness of breath that developed towards end of his dialysis session.  He had missed a couple of days of dialysis with a week prior to becoming ill with GI illness.  EMS reported oxygen saturations of 86% and he was placed on 6 L for transport.  Blood pressure has been suboptimally controlled.  Troponin 15 and BNP of 195.  Chest x-ray showed diffusely increased interstitial and hazy pulmonary opacities suspect for edema and tiny right pleural effusion.  Nephrology was consulted and he was taken for urgent dialysis shortly after admission.  Oxygen was weaned after several treatments and he no longer experienced any GI symptoms and was able to be discharged home.   Admitted again 7/18 - 06/29/2023 for continued shortness of breath and missed dialysis session.  Stated when he arrived on the 18th he had not been dialysis so he had missed approximately 10 days of dialysis and had difficulty accessing his left arm fistula.  No significant volume overload or other complication of missed dialysis was noted.  Vascular surgery placed a right IJ tunneled dialysis catheter on 7/19 and he received dialysis on the 19th and the 20 and the catheter was functional.  Admitted 09/15/09/924 after missing 7 dialysis sessions and ran out of his blood pressure medication and become increasingly short of breath with pressure-like chest discomfort.  He was treated with dialysis and fluid removal resumption of medications at discharge.  He presented again to [facility] emergency department 09/27/2023 for shortness of breath and feeling off for several weeks.  The date went to dialysis in a week and had ran out of his medications and oxygen.  He had marked hypertension with systolic greater than 200 and was placed on nitroglycerin drip.  10/20 blood pressure improved and nitro was weaned off.  Echocardiogram was completed which revealed an LVEF of 60 to 65%, no RWMA, G2 DD, trivial mitral regurg.  10/22 he was dialyzed successfully blood pressure improved.  10/23 was confused and more was slurring speech review of mar revealed he had both oxycodone and Xanax in the morning confusion and slurred speech resolved several hours later.  10/24 he underwent dialysis again.  He was considered stable at that time and was able to be discharged home on 10/03/2023.  Of note he could have been discharged several days prior but his family never came to pick him up.   He returns to clinic today   ROS: 10 point review of systems is reviewed and considered negative except what is been listed in the HPI  Studies Reviewed: .       2D echo 09/29/23 1. Left ventricular ejection fraction, by estimation, is 60 to 65%.  Left  ventricular ejection fraction by PLAX is 63 %. The left ventricle has  normal function. The left ventricle has no regional wall motion  abnormalities. There is mild left ventricular  hypertrophy. Left ventricular diastolic parameters are consistent with  Grade II diastolic dysfunction (pseudonormalization). The average left  ventricular global longitudinal strain is -17.7 %. The global longitudinal  strain is normal.   2. Right ventricular systolic function is normal. The right ventricular  size is normal.   3. Left atrial size was mildly dilated.   4. The mitral valve is normal in structure. Trivial mitral valve  regurgitation.   5. The aortic valve is tricuspid. Aortic valve regurgitation is not  visualized. Aortic valve sclerosis/calcification is present, without any  evidence of aortic stenosis.   Lexiscan MPI 06/26/2022   Low risk, probably normal pharmacologic myocardial perfusion stress test.   There is no evidence of significant ischemia or scar, though evaluation of the inferior wall is quite limited due to extracardiac activity and diaphragmatic attenuation.   Left ventricular systolic function is normal by visual estimation.  Calculated LVEF or 40-45% is likely artifactually low due  to significant extracardiac activity.   Mild coronary artery calcification is noted on the attenuation correction CT.   Faint hazy opacity is noted in the right upper lobe adjacent to the major fissure of uncertain clinical significance.  Dedicated chest CT could be obtained for further evaluation, as clinically indicated.  2D echo 06/24/23 1. Left ventricular ejection fraction, by estimation, is 60 to 65%. The  left ventricle has normal function. The left ventricle has no regional  wall motion abnormalities. There is mild left ventricular hypertrophy.  Left ventricular diastolic parameters  were normal.   2. Right ventricular systolic function is normal. The right ventricular  size is  normal.   3. The mitral valve is normal in structure. No evidence of mitral valve  regurgitation.   4. The aortic valve is tricuspid. Aortic valve regurgitation is not  visualized. Aortic valve sclerosis is present, with no evidence of aortic  valve stenosis.   Risk Assessment/Calculations:     No BP recorded.  {Refresh Note OR Click here to enter BP  :1}***       Physical Exam:   VS:  There were no vitals taken for this visit.   Wt Readings from Last 3 Encounters:  10/03/23 197 lb 5 oz (89.5 kg)  09/18/23 207 lb 12.8 oz (94.3 kg)  09/09/23 221 lb 3.2 oz (100.3 kg)    GEN: Well nourished, well developed in no acute distress NECK: No JVD; No carotid bruits CARDIAC: ***RRR, no murmurs, rubs, gallops RESPIRATORY:  Clear to auscultation without rales, wheezing or rhonchi  ABDOMEN: Soft, non-tender, non-distended EXTREMITIES:  No edema; No deformity   ASSESSMENT AND PLAN: .   ***    {Are you ordering a CV Procedure (e.g. stress test, cath, DCCV, TEE, etc)?   Press F2        :[id number]}  Dispo: ***  Signed, Idalys Konecny, NP

## 2023-10-26 ENCOUNTER — Other Ambulatory Visit: Payer: Self-pay

## 2023-10-26 ENCOUNTER — Emergency Department: Payer: Medicare Other

## 2023-10-26 ENCOUNTER — Inpatient Hospital Stay
Admission: EM | Admit: 2023-10-26 | Discharge: 2023-10-31 | DRG: 189 | Disposition: A | Discharge status: Home / Self Care | Payer: Medicare Other | Attending: Hospitalist | Admitting: Hospitalist

## 2023-10-26 DIAGNOSIS — J441 Chronic obstructive pulmonary disease with (acute) exacerbation: Secondary | ICD-10-CM | POA: Diagnosis not present

## 2023-10-26 DIAGNOSIS — E1122 Type 2 diabetes mellitus with diabetic chronic kidney disease: Secondary | ICD-10-CM | POA: Diagnosis present

## 2023-10-26 DIAGNOSIS — E669 Obesity, unspecified: Secondary | ICD-10-CM | POA: Diagnosis present

## 2023-10-26 DIAGNOSIS — D72829 Elevated white blood cell count, unspecified: Secondary | ICD-10-CM | POA: Diagnosis present

## 2023-10-26 DIAGNOSIS — E785 Hyperlipidemia, unspecified: Secondary | ICD-10-CM | POA: Diagnosis present

## 2023-10-26 DIAGNOSIS — K219 Gastro-esophageal reflux disease without esophagitis: Secondary | ICD-10-CM | POA: Diagnosis present

## 2023-10-26 DIAGNOSIS — Z8349 Family history of other endocrine, nutritional and metabolic diseases: Secondary | ICD-10-CM

## 2023-10-26 DIAGNOSIS — I5032 Chronic diastolic (congestive) heart failure: Secondary | ICD-10-CM | POA: Diagnosis present

## 2023-10-26 DIAGNOSIS — E875 Hyperkalemia: Secondary | ICD-10-CM | POA: Diagnosis present

## 2023-10-26 DIAGNOSIS — F17210 Nicotine dependence, cigarettes, uncomplicated: Secondary | ICD-10-CM | POA: Diagnosis present

## 2023-10-26 DIAGNOSIS — Z8249 Family history of ischemic heart disease and other diseases of the circulatory system: Secondary | ICD-10-CM

## 2023-10-26 DIAGNOSIS — Z888 Allergy status to other drugs, medicaments and biological substances status: Secondary | ICD-10-CM

## 2023-10-26 DIAGNOSIS — J9621 Acute and chronic respiratory failure with hypoxia: Secondary | ICD-10-CM | POA: Diagnosis not present

## 2023-10-26 DIAGNOSIS — F410 Panic disorder [episodic paroxysmal anxiety] without agoraphobia: Secondary | ICD-10-CM | POA: Diagnosis present

## 2023-10-26 DIAGNOSIS — N186 End stage renal disease: Secondary | ICD-10-CM | POA: Diagnosis present

## 2023-10-26 DIAGNOSIS — Z992 Dependence on renal dialysis: Secondary | ICD-10-CM

## 2023-10-26 DIAGNOSIS — I132 Hypertensive heart and chronic kidney disease with heart failure and with stage 5 chronic kidney disease, or end stage renal disease: Secondary | ICD-10-CM | POA: Diagnosis present

## 2023-10-26 DIAGNOSIS — Z8261 Family history of arthritis: Secondary | ICD-10-CM

## 2023-10-26 DIAGNOSIS — Z825 Family history of asthma and other chronic lower respiratory diseases: Secondary | ICD-10-CM

## 2023-10-26 DIAGNOSIS — Z8614 Personal history of Methicillin resistant Staphylococcus aureus infection: Secondary | ICD-10-CM

## 2023-10-26 DIAGNOSIS — J439 Emphysema, unspecified: Secondary | ICD-10-CM | POA: Diagnosis present

## 2023-10-26 DIAGNOSIS — Z83438 Family history of other disorder of lipoprotein metabolism and other lipidemia: Secondary | ICD-10-CM

## 2023-10-26 DIAGNOSIS — E871 Hypo-osmolality and hyponatremia: Secondary | ICD-10-CM | POA: Diagnosis present

## 2023-10-26 DIAGNOSIS — Z881 Allergy status to other antibiotic agents status: Secondary | ICD-10-CM

## 2023-10-26 DIAGNOSIS — Z818 Family history of other mental and behavioral disorders: Secondary | ICD-10-CM

## 2023-10-26 DIAGNOSIS — F32A Depression, unspecified: Secondary | ICD-10-CM | POA: Diagnosis present

## 2023-10-26 DIAGNOSIS — D631 Anemia in chronic kidney disease: Secondary | ICD-10-CM | POA: Diagnosis present

## 2023-10-26 DIAGNOSIS — Z9981 Dependence on supplemental oxygen: Secondary | ICD-10-CM

## 2023-10-26 DIAGNOSIS — Z79899 Other long term (current) drug therapy: Secondary | ICD-10-CM

## 2023-10-26 DIAGNOSIS — J9622 Acute and chronic respiratory failure with hypercapnia: Secondary | ICD-10-CM | POA: Diagnosis present

## 2023-10-26 DIAGNOSIS — Z6830 Body mass index (BMI) 30.0-30.9, adult: Secondary | ICD-10-CM

## 2023-10-26 DIAGNOSIS — I10 Essential (primary) hypertension: Secondary | ICD-10-CM | POA: Diagnosis present

## 2023-10-26 DIAGNOSIS — R000 Tachycardia, unspecified: Secondary | ICD-10-CM | POA: Diagnosis present

## 2023-10-26 DIAGNOSIS — Z7952 Long term (current) use of systemic steroids: Secondary | ICD-10-CM

## 2023-10-26 DIAGNOSIS — Z833 Family history of diabetes mellitus: Secondary | ICD-10-CM

## 2023-10-26 DIAGNOSIS — N2581 Secondary hyperparathyroidism of renal origin: Secondary | ICD-10-CM | POA: Diagnosis present

## 2023-10-26 DIAGNOSIS — Z7982 Long term (current) use of aspirin: Secondary | ICD-10-CM

## 2023-10-26 DIAGNOSIS — G4733 Obstructive sleep apnea (adult) (pediatric): Secondary | ICD-10-CM | POA: Diagnosis present

## 2023-10-26 DIAGNOSIS — Z885 Allergy status to narcotic agent status: Secondary | ICD-10-CM

## 2023-10-26 DIAGNOSIS — Z841 Family history of disorders of kidney and ureter: Secondary | ICD-10-CM

## 2023-10-26 LAB — CBC WITH DIFFERENTIAL/PLATELET
Abs Immature Granulocytes: 0.17 10*3/uL — ABNORMAL HIGH (ref 0.00–0.07)
Basophils Absolute: 0.1 10*3/uL (ref 0.0–0.1)
Basophils Relative: 0 %
Eosinophils Absolute: 0.1 10*3/uL (ref 0.0–0.5)
Eosinophils Relative: 1 %
HCT: 34.4 % — ABNORMAL LOW (ref 39.0–52.0)
Hemoglobin: 11 g/dL — ABNORMAL LOW (ref 13.0–17.0)
Immature Granulocytes: 1 %
Lymphocytes Relative: 6 %
Lymphs Abs: 0.8 10*3/uL (ref 0.7–4.0)
MCH: 32.4 pg (ref 26.0–34.0)
MCHC: 32 g/dL (ref 30.0–36.0)
MCV: 101.5 fL — ABNORMAL HIGH (ref 80.0–100.0)
Monocytes Absolute: 1.3 10*3/uL — ABNORMAL HIGH (ref 0.1–1.0)
Monocytes Relative: 10 %
Neutro Abs: 10.6 10*3/uL — ABNORMAL HIGH (ref 1.7–7.7)
Neutrophils Relative %: 82 %
Platelets: 220 10*3/uL (ref 150–400)
RBC: 3.39 MIL/uL — ABNORMAL LOW (ref 4.22–5.81)
RDW: 13.8 % (ref 11.5–15.5)
WBC: 13 10*3/uL — ABNORMAL HIGH (ref 4.0–10.5)
nRBC: 0 % (ref 0.0–0.2)

## 2023-10-26 LAB — COMPREHENSIVE METABOLIC PANEL
ALT: 11 U/L (ref 0–44)
AST: 15 U/L (ref 15–41)
Albumin: 3.9 g/dL (ref 3.5–5.0)
Alkaline Phosphatase: 98 U/L (ref 38–126)
Anion gap: 10 (ref 5–15)
BUN: 52 mg/dL — ABNORMAL HIGH (ref 6–20)
CO2: 27 mmol/L (ref 22–32)
Calcium: 8.3 mg/dL — ABNORMAL LOW (ref 8.9–10.3)
Chloride: 105 mmol/L (ref 98–111)
Creatinine, Ser: 6.13 mg/dL — ABNORMAL HIGH (ref 0.61–1.24)
GFR, Estimated: 10 mL/min — ABNORMAL LOW (ref >60)
Glucose, Bld: 139 mg/dL — ABNORMAL HIGH (ref 70–99)
Potassium: 4.1 mmol/L (ref 3.5–5.1)
Sodium: 142 mmol/L (ref 135–145)
Total Bilirubin: 0.5 mg/dL (ref <1.2)
Total Protein: 7.7 g/dL (ref 6.5–8.1)

## 2023-10-26 LAB — TROPONIN I (HIGH SENSITIVITY): Troponin I (High Sensitivity): 42 ng/L — ABNORMAL HIGH (ref <18)

## 2023-10-26 LAB — MAGNESIUM: Magnesium: 2.4 mg/dL (ref 1.7–2.4)

## 2023-10-26 MED ORDER — HYDRALAZINE HCL 20 MG/ML IJ SOLN
10.0000 mg | Freq: Once | INTRAMUSCULAR | Status: AC
  Administered 2023-10-26: 10 mg via INTRAVENOUS
  Filled 2023-10-26: qty 1

## 2023-10-26 MED ORDER — IPRATROPIUM-ALBUTEROL 0.5-2.5 (3) MG/3ML IN SOLN
3.0000 mL | Freq: Once | RESPIRATORY_TRACT | Status: AC
  Administered 2023-10-26: 3 mL via RESPIRATORY_TRACT
  Filled 2023-10-26: qty 3

## 2023-10-26 MED ORDER — PREDNISONE 20 MG PO TABS: 40.0000 mg | ORAL_TABLET | Freq: Every day | ORAL | 0 refills | Status: DC

## 2023-10-26 MED ORDER — ALBUTEROL SULFATE (2.5 MG/3ML) 0.083% IN NEBU
5.0000 mg | INHALATION_SOLUTION | Freq: Once | RESPIRATORY_TRACT | Status: AC
Start: 2023-10-26 — End: 2023-10-26
  Administered 2023-10-26: 5 mg via RESPIRATORY_TRACT
  Filled 2023-10-26: qty 6

## 2023-10-26 MED ORDER — DOXYCYCLINE HYCLATE 100 MG PO CAPS: 100.0000 mg | ORAL_CAPSULE | Freq: Two times a day (BID) | ORAL | 0 refills | Status: DC

## 2023-10-26 MED ORDER — DOXYCYCLINE HYCLATE 100 MG PO TABS
100.0000 mg | ORAL_TABLET | Freq: Once | ORAL | Status: AC
  Administered 2023-10-26: 100 mg via ORAL
  Filled 2023-10-26: qty 1

## 2023-10-26 MED ORDER — MAGNESIUM SULFATE 2 GM/50ML IV SOLN
2.0000 g | INTRAVENOUS | Status: AC
  Administered 2023-10-26: 2 g via INTRAVENOUS
  Filled 2023-10-26: qty 50

## 2023-10-26 MED ORDER — METHYLPREDNISOLONE SODIUM SUCC 125 MG IJ SOLR
125.0000 mg | INTRAMUSCULAR | Status: AC
  Administered 2023-10-26: 125 mg via INTRAVENOUS
  Filled 2023-10-26: qty 2

## 2023-10-26 NOTE — ED Notes (Signed)
Patient lying in bed. Lethargic.  Lungs sounds congested.  No complaints of pain at this time. No signs of distress.

## 2023-10-26 NOTE — ED Triage Notes (Signed)
Pt was supposed to get dialysis tues, thurs, sat- only did Thursday this week. Pt states he felt too weak to go. Pt on 3 liters chronically- 87% percent on arrival. Pt c/o chronic left shoulder pain. Pt is AOX4, respirations even- dyspneic at rest. Pt w/ productive cough and nasal congestion.

## 2023-10-26 NOTE — ED Provider Notes (Signed)
[facility] Provider Note    Event Date/Time   First MD Initiated Contact with Patient 10/26/23 2134     (approximate)   History   Chief Complaint: Shortness of breath  HPI  Derek Malia. is a 57 y.o. male with a history of hypertension, COPD, diabetes, CHF, ESRD on hemodialysis Tuesday Thursday Saturday who comes to the ED complaining of fatigue and shortness of breath.  Reports that he is supposed to be on 3 L nasal cannula oxygen at home at all times.  Reports shortness of breath and productive cough for the last few days.  Last dialysis was 2 days ago on Thursday, did not go today.  Denies chest pain.  No abdominal pain vomiting or diarrhea.          Physical Exam   Triage Vital Signs: ED Triage Vitals [10/26/23 2118]  Encounter Vitals Group     BP (!) 170/85     Systolic BP Percentile      Diastolic BP Percentile      Pulse Rate (!) 117     Resp (!) 22     Temp 97.8 F (36.6 C)     Temp Source Oral     SpO2 (!) 87 %     Weight      Height      Head Circumference      Peak Flow      Pain Score 4     Pain Loc      Pain Education      Exclude from Growth Chart     Most recent vital signs: Vitals:   10/26/23 2118 10/26/23 2250  BP: (!) 170/85 (!) 207/86  Pulse: (!) 117 89  Resp: (!) 22 19  Temp: 97.8 F (36.6 C)   SpO2: (!) 87% 100%    General: Awake, no distress.  CV:  Good peripheral perfusion.  Regular rate and rhythm, heart rate 85 Resp:  Normal effort.  Diffuse expiratory wheezing and prolonged expiratory phase. Abd:  No distention.  Soft nontender Other:  No lower extremity edema   ED Results / Procedures / Treatments   Labs (all labs ordered are listed, but only abnormal results are displayed) Labs Reviewed  CBC WITH DIFFERENTIAL/PLATELET - Abnormal; Notable for the following components:      Result Value   WBC 13.0 (*)    RBC 3.39 (*)    Hemoglobin 11.0 (*)    HCT 34.4 (*)    MCV 101.5 (*)    Neutro  Abs 10.6 (*)    Monocytes Absolute 1.3 (*)    Abs Immature Granulocytes 0.17 (*)    All other components within normal limits  COMPREHENSIVE METABOLIC PANEL - Abnormal; Notable for the following components:   Glucose, Bld 139 (*)    BUN 52 (*)    Creatinine, Ser 6.13 (*)    Calcium 8.3 (*)    GFR, Estimated 10 (*)    All other components within normal limits  TROPONIN I (HIGH SENSITIVITY) - Abnormal; Notable for the following components:   Troponin I (High Sensitivity) 42 (*)    All other components within normal limits  MAGNESIUM  TROPONIN I (HIGH SENSITIVITY)     EKG Interpreted by me Sinus rhythm rate of 87.  Normal axis intervals QRS ST segments and T waves   RADIOLOGY Chest x-ray interpreted by me, negative for edema effusion or consolidation.  Radiology report reviewed   PROCEDURES:  Procedures  MEDICATIONS ORDERED IN ED: Medications  magnesium sulfate IVPB 2 g 50 mL (2 g Intravenous New Bag/Given 10/26/23 2302)  methylPREDNISolone sodium succinate (SOLU-MEDROL) 125 mg/2 mL injection 125 mg (125 mg Intravenous Given 10/26/23 2257)  ipratropium-albuterol (DUONEB) 0.5-2.5 (3) MG/3ML nebulizer solution 3 mL (3 mLs Nebulization Given 10/26/23 2254)  albuterol (PROVENTIL) (2.5 MG/3ML) 0.083% nebulizer solution 5 mg (5 mg Nebulization Given 10/26/23 2254)  doxycycline (VIBRA-TABS) tablet 100 mg (100 mg Oral Given 10/26/23 2253)     IMPRESSION / MDM / ASSESSMENT AND PLAN / ED COURSE  I reviewed the triage vital signs and the nursing notes.  DDx: COPD exacerbation, pneumonia, pulmonary edema, pleural effusion, electrolyte abnormality, anemia, NSTEMI  Patient's presentation is most consistent with acute presentation with potential threat to life or bodily function.  Patient presents with shortness of breath and cough, clinical suspicion for COPD exacerbation.  Does not appear volume overloaded.  Doubt PE dissection or pericardial effusion.  Labs overall reassuring.   Will need to obtain repeat troponin.  Will give Solu-Medrol, bronchodilators, magnesium, start on doxycycline.  If symptomatically improved, anticipate patient will be suitable for discharge and outpatient follow-up.       FINAL CLINICAL IMPRESSION(S) / ED DIAGNOSES   Final diagnoses:  COPD exacerbation (HCC)  ESRD on hemodialysis (HCC)     Rx / DC Orders   ED Discharge Orders          Ordered    predniSONE (DELTASONE) 20 MG tablet  Daily with breakfast        10/26/23 2308    doxycycline (VIBRAMYCIN) 100 MG capsule  2 times daily        10/26/23 2308             Note:  This document was prepared using Dragon voice recognition software and may include unintentional dictation errors.   Sharman Cheek, MD 10/26/23 [phone]

## 2023-10-26 NOTE — ED Notes (Signed)
RN attempted to obtain IV access and labs. IV attempt unsuccessful.

## 2023-10-26 NOTE — ED Notes (Incomplete)
Patient having S

## 2023-10-26 NOTE — ED Notes (Signed)
Patient having SOB, labored breathing.  Unable to get enough air.  RT and MD aware, see new orders.

## 2023-10-26 NOTE — ED Notes (Signed)
Feliz Beam RN at bedside to obtain IV access using [place].

## 2023-10-27 DIAGNOSIS — I5032 Chronic diastolic (congestive) heart failure: Secondary | ICD-10-CM

## 2023-10-27 DIAGNOSIS — F17210 Nicotine dependence, cigarettes, uncomplicated: Secondary | ICD-10-CM | POA: Diagnosis present

## 2023-10-27 DIAGNOSIS — N186 End stage renal disease: Secondary | ICD-10-CM

## 2023-10-27 DIAGNOSIS — Z8249 Family history of ischemic heart disease and other diseases of the circulatory system: Secondary | ICD-10-CM | POA: Diagnosis not present

## 2023-10-27 DIAGNOSIS — G4733 Obstructive sleep apnea (adult) (pediatric): Secondary | ICD-10-CM | POA: Diagnosis present

## 2023-10-27 DIAGNOSIS — J9621 Acute and chronic respiratory failure with hypoxia: Secondary | ICD-10-CM | POA: Diagnosis present

## 2023-10-27 DIAGNOSIS — Z992 Dependence on renal dialysis: Secondary | ICD-10-CM | POA: Diagnosis not present

## 2023-10-27 DIAGNOSIS — J439 Emphysema, unspecified: Secondary | ICD-10-CM | POA: Diagnosis present

## 2023-10-27 DIAGNOSIS — I132 Hypertensive heart and chronic kidney disease with heart failure and with stage 5 chronic kidney disease, or end stage renal disease: Secondary | ICD-10-CM | POA: Diagnosis present

## 2023-10-27 DIAGNOSIS — E871 Hypo-osmolality and hyponatremia: Secondary | ICD-10-CM | POA: Diagnosis present

## 2023-10-27 DIAGNOSIS — Z8261 Family history of arthritis: Secondary | ICD-10-CM | POA: Diagnosis not present

## 2023-10-27 DIAGNOSIS — Z9981 Dependence on supplemental oxygen: Secondary | ICD-10-CM | POA: Diagnosis not present

## 2023-10-27 DIAGNOSIS — F32A Depression, unspecified: Secondary | ICD-10-CM | POA: Diagnosis present

## 2023-10-27 DIAGNOSIS — E669 Obesity, unspecified: Secondary | ICD-10-CM | POA: Diagnosis present

## 2023-10-27 DIAGNOSIS — J9622 Acute and chronic respiratory failure with hypercapnia: Secondary | ICD-10-CM | POA: Diagnosis present

## 2023-10-27 DIAGNOSIS — K219 Gastro-esophageal reflux disease without esophagitis: Secondary | ICD-10-CM | POA: Diagnosis present

## 2023-10-27 DIAGNOSIS — E875 Hyperkalemia: Secondary | ICD-10-CM | POA: Diagnosis present

## 2023-10-27 DIAGNOSIS — E785 Hyperlipidemia, unspecified: Secondary | ICD-10-CM

## 2023-10-27 DIAGNOSIS — I10 Essential (primary) hypertension: Secondary | ICD-10-CM

## 2023-10-27 DIAGNOSIS — J441 Chronic obstructive pulmonary disease with (acute) exacerbation: Secondary | ICD-10-CM

## 2023-10-27 DIAGNOSIS — N2581 Secondary hyperparathyroidism of renal origin: Secondary | ICD-10-CM | POA: Diagnosis present

## 2023-10-27 DIAGNOSIS — E1122 Type 2 diabetes mellitus with diabetic chronic kidney disease: Secondary | ICD-10-CM | POA: Diagnosis present

## 2023-10-27 DIAGNOSIS — Z8614 Personal history of Methicillin resistant Staphylococcus aureus infection: Secondary | ICD-10-CM | POA: Diagnosis not present

## 2023-10-27 DIAGNOSIS — D72829 Elevated white blood cell count, unspecified: Secondary | ICD-10-CM | POA: Diagnosis present

## 2023-10-27 DIAGNOSIS — D631 Anemia in chronic kidney disease: Secondary | ICD-10-CM | POA: Diagnosis present

## 2023-10-27 LAB — BLOOD GAS, ARTERIAL
Acid-Base Excess: 0.7 mmol/L (ref 0.0–2.0)
Acid-base deficit: 1 mmol/L (ref 0.0–2.0)
Bicarbonate: 27.4 mmol/L (ref 20.0–28.0)
Bicarbonate: 30 mmol/L — ABNORMAL HIGH (ref 20.0–28.0)
Delivery systems: POSITIVE
Delivery systems: POSITIVE
Expiratory PAP: 5 cm[H2O]
FIO2: 50 %
Inspiratory PAP: 12 cm[H2O]
O2 Saturation: 71.9 %
O2 Saturation: 97.4 %
Patient temperature: 37
Patient temperature: 37
pCO2 arterial: 61 mm[Hg] — ABNORMAL HIGH (ref 32–48)
pCO2 arterial: 70 mm[Hg] (ref 32–48)
pH, Arterial: 7.24 — ABNORMAL LOW (ref 7.35–7.45)
pH, Arterial: 7.26 — ABNORMAL LOW (ref 7.35–7.45)
pO2, Arterial: 40 mm[Hg] — CL (ref 83–108)
pO2, Arterial: 77 mm[Hg] — ABNORMAL LOW (ref 83–108)

## 2023-10-27 LAB — CBC
HCT: 32.5 % — ABNORMAL LOW (ref 39.0–52.0)
Hemoglobin: 10.7 g/dL — ABNORMAL LOW (ref 13.0–17.0)
MCH: 32 pg (ref 26.0–34.0)
MCHC: 32.9 g/dL (ref 30.0–36.0)
MCV: 97.3 fL (ref 80.0–100.0)
Platelets: 215 10*3/uL (ref 150–400)
RBC: 3.34 MIL/uL — ABNORMAL LOW (ref 4.22–5.81)
RDW: 13.9 % (ref 11.5–15.5)
WBC: 14.1 10*3/uL — ABNORMAL HIGH (ref 4.0–10.5)
nRBC: 0.2 % (ref 0.0–0.2)

## 2023-10-27 LAB — BASIC METABOLIC PANEL
Anion gap: 12 (ref 5–15)
BUN: 55 mg/dL — ABNORMAL HIGH (ref 6–20)
CO2: 22 mmol/L (ref 22–32)
Calcium: 8.3 mg/dL — ABNORMAL LOW (ref 8.9–10.3)
Chloride: 104 mmol/L (ref 98–111)
Creatinine, Ser: 6.53 mg/dL — ABNORMAL HIGH (ref 0.61–1.24)
GFR, Estimated: 9 mL/min — ABNORMAL LOW (ref >60)
Glucose, Bld: 193 mg/dL — ABNORMAL HIGH (ref 70–99)
Potassium: 6 mmol/L — ABNORMAL HIGH (ref 3.5–5.1)
Sodium: 138 mmol/L (ref 135–145)

## 2023-10-27 LAB — CBG MONITORING, ED: Glucose-Capillary: 180 mg/dL — ABNORMAL HIGH (ref 70–99)

## 2023-10-27 LAB — TROPONIN I (HIGH SENSITIVITY): Troponin I (High Sensitivity): 44 ng/L — ABNORMAL HIGH (ref <18)

## 2023-10-27 LAB — HEPATITIS B SURFACE ANTIGEN: Hepatitis B Surface Ag: NONREACTIVE

## 2023-10-27 MED ORDER — NICOTINE 14 MG/24HR TD PT24
14.0000 mg | MEDICATED_PATCH | Freq: Every day | TRANSDERMAL | Status: DC
  Administered 2023-10-28 – 2023-10-31 (×4): 14 mg via TRANSDERMAL
  Filled 2023-10-27 (×4): qty 1

## 2023-10-27 MED ORDER — AMLODIPINE BESYLATE 10 MG PO TABS
10.0000 mg | ORAL_TABLET | Freq: Every day | ORAL | Status: DC
  Administered 2023-10-27 – 2023-10-30 (×4): 10 mg via ORAL
  Filled 2023-10-27: qty 2
  Filled 2023-10-27 (×3): qty 1

## 2023-10-27 MED ORDER — DEXTROSE 5 % IV SOLN
500.0000 mg | INTRAVENOUS | Status: DC
  Administered 2023-10-27 – 2023-10-28 (×2): 500 mg via INTRAVENOUS
  Filled 2023-10-27 (×3): qty 5

## 2023-10-27 MED ORDER — OXYCODONE HCL 5 MG PO TABS
5.0000 mg | ORAL_TABLET | ORAL | Status: DC | PRN
  Administered 2023-10-28 – 2023-10-31 (×5): 5 mg via ORAL
  Filled 2023-10-27 (×4): qty 1

## 2023-10-27 MED ORDER — ANTICOAGULANT SODIUM CITRATE 4% (200MG/5ML) IV SOLN
5.0000 mL | Status: DC | PRN
Start: 2023-10-27 — End: 2023-10-30

## 2023-10-27 MED ORDER — ALTEPLASE 2 MG IJ SOLR: 2.0000 mg | Freq: Once | INTRAMUSCULAR | Status: DC | PRN

## 2023-10-27 MED ORDER — GUAIFENESIN ER 600 MG PO TB12
600.0000 mg | ORAL_TABLET | Freq: Two times a day (BID) | ORAL | Status: DC
  Administered 2023-10-27 – 2023-10-31 (×9): 600 mg via ORAL
  Filled 2023-10-27 (×10): qty 1

## 2023-10-27 MED ORDER — ACETAMINOPHEN 650 MG RE SUPP: 650.0000 mg | Freq: Four times a day (QID) | RECTAL | Status: DC | PRN

## 2023-10-27 MED ORDER — CARVEDILOL 6.25 MG PO TABS
6.2500 mg | ORAL_TABLET | Freq: Two times a day (BID) | ORAL | Status: DC
  Administered 2023-10-27 – 2023-10-30 (×7): 6.25 mg via ORAL
  Filled 2023-10-27 (×7): qty 1

## 2023-10-27 MED ORDER — PENTAFLUOROPROP-TETRAFLUOROETH EX AERO
1.0000 | INHALATION_SPRAY | CUTANEOUS | Status: DC | PRN
Start: 2023-10-27 — End: 2023-10-30

## 2023-10-27 MED ORDER — LIDOCAINE-PRILOCAINE 2.5-2.5 % EX CREA
1.0000 | TOPICAL_CREAM | CUTANEOUS | Status: DC | PRN
Start: 2023-10-27 — End: 2023-10-30

## 2023-10-27 MED ORDER — TOPIRAMATE 25 MG PO TABS
50.0000 mg | ORAL_TABLET | Freq: Two times a day (BID) | ORAL | Status: DC
  Administered 2023-10-27 – 2023-10-31 (×9): 50 mg via ORAL
  Filled 2023-10-27 (×10): qty 2

## 2023-10-27 MED ORDER — METHYLPREDNISOLONE SODIUM SUCC 40 MG IJ SOLR
40.0000 mg | Freq: Two times a day (BID) | INTRAMUSCULAR | Status: AC
  Administered 2023-10-27 (×2): 40 mg via INTRAVENOUS
  Filled 2023-10-27 (×2): qty 1

## 2023-10-27 MED ORDER — ENOXAPARIN SODIUM 60 MG/0.6ML IJ SOSY
45.0000 mg | PREFILLED_SYRINGE | INTRAMUSCULAR | Status: DC
  Administered 2023-10-27: 45 mg via SUBCUTANEOUS
  Filled 2023-10-27: qty 0.6

## 2023-10-27 MED ORDER — PANTOPRAZOLE SODIUM 40 MG PO TBEC: 40.0000 mg | DELAYED_RELEASE_TABLET | Freq: Two times a day (BID) | ORAL | Status: DC | PRN

## 2023-10-27 MED ORDER — NEPRO/CARBSTEADY PO LIQD
237.0000 mL | ORAL | Status: DC | PRN
Start: 2023-10-27 — End: 2023-10-30

## 2023-10-27 MED ORDER — HYDROCOD POLI-CHLORPHE POLI ER 10-8 MG/5ML PO SUER: 5.0000 mL | Freq: Two times a day (BID) | ORAL | Status: DC | PRN

## 2023-10-27 MED ORDER — HEPARIN SODIUM (PORCINE) 5000 UNIT/ML IJ SOLN
5000.0000 [IU] | Freq: Three times a day (TID) | INTRAMUSCULAR | Status: DC
  Administered 2023-10-27 – 2023-10-31 (×11): 5000 [IU] via SUBCUTANEOUS
  Filled 2023-10-27 (×12): qty 1

## 2023-10-27 MED ORDER — ASPIRIN 81 MG PO TBEC
81.0000 mg | DELAYED_RELEASE_TABLET | Freq: Every day | ORAL | Status: DC
  Administered 2023-10-27 – 2023-10-31 (×5): 81 mg via ORAL
  Filled 2023-10-27 (×5): qty 1

## 2023-10-27 MED ORDER — LIDOCAINE HCL (PF) 1 % IJ SOLN
5.0000 mL | INTRAMUSCULAR | Status: DC | PRN
Start: 2023-10-27 — End: 2023-10-30

## 2023-10-27 MED ORDER — EZETIMIBE 10 MG PO TABS
10.0000 mg | ORAL_TABLET | Freq: Every day | ORAL | Status: DC
  Administered 2023-10-27 – 2023-10-31 (×5): 10 mg via ORAL
  Filled 2023-10-27 (×5): qty 1

## 2023-10-27 MED ORDER — TRAZODONE HCL 50 MG PO TABS
25.0000 mg | ORAL_TABLET | Freq: Every evening | ORAL | Status: DC | PRN
  Administered 2023-10-30: 25 mg via ORAL
  Filled 2023-10-27: qty 1

## 2023-10-27 MED ORDER — ACETAMINOPHEN 325 MG PO TABS: 650.0000 mg | ORAL_TABLET | Freq: Four times a day (QID) | ORAL | Status: DC | PRN

## 2023-10-27 MED ORDER — ONDANSETRON HCL 4 MG PO TABS: 4.0000 mg | ORAL_TABLET | Freq: Four times a day (QID) | ORAL | Status: DC | PRN

## 2023-10-27 MED ORDER — SODIUM CHLORIDE 0.9 % IV SOLN
1.0000 g | INTRAVENOUS | Status: DC
  Administered 2023-10-27 – 2023-10-30 (×4): 1 g via INTRAVENOUS
  Filled 2023-10-27 (×4): qty 10

## 2023-10-27 MED ORDER — MAGNESIUM HYDROXIDE 400 MG/5ML PO SUSP: 30.0000 mL | Freq: Every day | ORAL | Status: DC | PRN

## 2023-10-27 MED ORDER — LOSARTAN POTASSIUM 50 MG PO TABS
100.0000 mg | ORAL_TABLET | Freq: Every day | ORAL | Status: DC
  Administered 2023-10-27 – 2023-10-30 (×4): 100 mg via ORAL
  Filled 2023-10-27 (×5): qty 2

## 2023-10-27 MED ORDER — CHLORHEXIDINE GLUCONATE CLOTH 2 % EX PADS
6.0000 | MEDICATED_PAD | Freq: Every day | CUTANEOUS | Status: DC
  Administered 2023-10-29 – 2023-10-31 (×3): 6 via TOPICAL
  Filled 2023-10-27 (×2): qty 6

## 2023-10-27 MED ORDER — PREDNISONE 20 MG PO TABS
40.0000 mg | ORAL_TABLET | Freq: Every day | ORAL | Status: AC
  Administered 2023-10-28 – 2023-10-31 (×4): 40 mg via ORAL
  Filled 2023-10-27 (×4): qty 2

## 2023-10-27 MED ORDER — DULOXETINE HCL 30 MG PO CPEP
60.0000 mg | ORAL_CAPSULE | Freq: Every day | ORAL | Status: DC
  Administered 2023-10-27 – 2023-10-31 (×5): 60 mg via ORAL
  Filled 2023-10-27: qty 1
  Filled 2023-10-27 (×4): qty 2

## 2023-10-27 MED ORDER — HEPARIN SODIUM (PORCINE) 1000 UNIT/ML DIALYSIS
1000.0000 [IU] | INTRAMUSCULAR | Status: DC | PRN
Start: 2023-10-27 — End: 2023-10-30

## 2023-10-27 MED ORDER — ONDANSETRON HCL 4 MG/2ML IJ SOLN: 4.0000 mg | Freq: Four times a day (QID) | INTRAMUSCULAR | Status: DC | PRN

## 2023-10-27 MED ORDER — HEPARIN SODIUM (PORCINE) 1000 UNIT/ML DIALYSIS
20.0000 [IU]/kg | INTRAMUSCULAR | Status: DC | PRN
  Administered 2023-10-27 – 2023-10-30 (×2): 1800 [IU] via INTRAVENOUS_CENTRAL
  Filled 2023-10-27 (×2): qty 2

## 2023-10-27 NOTE — ED Notes (Signed)
Currently on bipap, resting.

## 2023-10-27 NOTE — Progress Notes (Signed)
Hemodialysis note  Received patient in bed. Alert and oriented.  Informed consent signed and in chart.  Treatment initiated: 1417 Treatment completed: 1745 (patient was rinsed back 5 mins early due to set up being clotted).  Patient tolerated well. Alert without acute distress.  Report given to patient's RN.   Access used: Left arm AVF Access issues: First cannulation attempt for arterial was unsuccessful. There was a big clot at the end of the needle after pulling out. Second attempt was successful.   Total UF removed: 2.4 L Medication(s) given:  none  Post HD weight: unable to obtain. Bed scale is broken.    Wolfgang Phoenix [facility]

## 2023-10-27 NOTE — Progress Notes (Signed)
PHARMACIST - PHYSICIAN COMMUNICATION  CONCERNING:  Enoxaparin (Lovenox) for DVT Prophylaxis    RECOMMENDATION: Patient was prescribed enoxaprin 40mg  q24 hours for VTE prophylaxis.   Filed Weights   10/27/23 0100  Weight: 89.5 kg (197 lb 5 oz)    Body mass index is 30.9 kg/m.  Estimated Creatinine Clearance: 14.2 mL/min (A) (by C-G formula based on SCr of 6.13 mg/dL (H)).   Based on [facility] policy patient is candidate for enoxaparin 0.5mg /kg TBW SQ every 24 hours based on BMI being >30.  DESCRIPTION: Pharmacy has adjusted enoxaparin dose per [facility] policy.  Patient is now receiving enoxaparin 0.5 mg/kg every 24 hours   Otelia Sergeant, PharmD, [facility] 10/27/2023 1:28 AM

## 2023-10-27 NOTE — ED Provider Notes (Signed)
-----------------------------------------   12:04 AM on 10/27/2023 -----------------------------------------  I took over care of this patient from Dr. Scotty Court.  The patient became acutely more short of breath and uncomfortable appearing.  He is on 4 L nasal cannula and his O2 saturation is 100% but he demonstrates tachypnea and significant increased work of breathing.  Lung exam still reveals wheezing.  There is no evidence of pulmonary edema.  I have ordered the patient to be placed on BiPAP.  ----------------------------------------- 12:44 AM on 10/27/2023 -----------------------------------------  The patient is significant improved on BiPAP.  O2 saturation remains in the 90s and his work of breathing is markedly improved.  He looks much more comfortable.  ABG was obtained although it is likely either venous or mixed given the arterial pO2 of 40 and sat of 71 which does not agree with the patient's monitor O2 sat or his clinical status.  He will need admission for further management.  I consulted Dr. Mertie Moores from the hospitalist service; based on our discussion he agrees to evaluate the patient for admission.  .Critical Care  Performed by: Dionne Bucy, MD Authorized by: Dionne Bucy, MD   Critical care provider statement:    Critical care time (minutes):  30   Critical care time was exclusive of:  Separately billable procedures and treating other patients   Critical care was necessary to treat or prevent imminent or life-threatening deterioration of the following conditions:  Respiratory failure   Critical care was time spent personally by me on the following activities:  Development of treatment plan with patient or surrogate, discussions with consultants, evaluation of patient's response to treatment, examination of patient, ordering and review of laboratory studies, ordering and review of radiographic studies, ordering and performing treatments and interventions, pulse  oximetry, re-evaluation of patient's condition, review of old charts and obtaining history from patient or surrogate   Care discussed with: admitting provider       Dionne Bucy, MD 10/27/23 0045

## 2023-10-27 NOTE — Assessment & Plan Note (Addendum)
-   We will place the patient IV steroid therapy with IV Solu-Medrol as well as nebulized bronchodilator therapy with duonebs q.i.d. and q.4 hours p.r.n.Marland Kitchen - Mucolytic therapy will be provided with Mucinex and antibiotic therapy with IV Rocephin. - Will hold off long-acting beta agonist. - O2 protocol will be followed.

## 2023-10-27 NOTE — Progress Notes (Signed)
[facility], [place] 10/27/23  Subjective:   LOS: 0  Patient presented to the emergency room for complaints of feeling sick.  He reports that he has had loose stools over the past few days therefore he missed his outpatient dialysis treatments.  His last treatment was on Tuesday.  He missed Thursday and Saturday.  He has been able to eat small bites.  He has been coughing that is productive of phlegm.  He does not know the color.  In the ER he is requiring BiPAP and IV steroids for treatment of COPD exacerbation.  Current smoker.   Objective:  Vital signs in last 24 hours:  Temp:  [97.6 F (36.4 C)-98 F (36.7 C)] 98 F (36.7 C) (11/17 1015) Pulse Rate:  [73-117] 73 (11/17 1145) Resp:  [14-29] 17 (11/17 1145) BP: (121-217)/(73-124) 170/89 (11/17 1015) SpO2:  [87 %-100 %] 100 % (11/17 1145) FiO2 (%):  [35 %-50 %] 35 % (11/17 1145) Weight:  [89.5 kg] 89.5 kg (11/17 0100)  Weight change:  Filed Weights   10/27/23 0100  Weight: 89.5 kg    Intake/Output:    Intake/Output Summary (Last 24 hours) at 10/27/2023 1210 Last data filed at 10/27/2023 0251 Gross per 24 hour  Intake 150 ml  Output --  Net 150 ml     Physical Exam: General: Mild distress from respiratory illness  HEENT Anicteric,  Pulm/lungs NIPPV, mild diffuse rhonchi, mild crackles at bases  CVS/Heart Tachycardic  Abdomen:  Soft, obese, nontender  Extremities: Trace edema  Neurologic: Alert, oriented  Skin: No acute rashes  Access: Left forearm AV fistula       Basic Metabolic Panel:  Recent Labs  Lab 10/26/23 2232 10/27/23 0644  NA 142 138  K 4.1 6.0*  CL 105 104  CO2 27 22  GLUCOSE 139* 193*  BUN 52* 55*  CREATININE 6.13* 6.53*  CALCIUM 8.3* 8.3*  MG 2.4  --      CBC: Recent Labs  Lab 10/26/23 2232 10/27/23 0644  WBC 13.0* 14.1*  NEUTROABS 10.6*  --   HGB 11.0* 10.7*  HCT 34.4* 32.5*  MCV 101.5* 97.3  PLT 220 215      Lab Results  Component Value  Date   HEPBSAG NON REACTIVE 09/28/2023   HEPBSAB NON REACTIVE 09/07/2022      Microbiology:  No results found for this or any previous visit (from the past 240 hour(s)).  Coagulation Studies: No results for input(s): "LABPROT", "INR" in the last 72 hours.  Urinalysis: No results for input(s): "COLORURINE", "LABSPEC", "PHURINE", "GLUCOSEU", "HGBUR", "BILIRUBINUR", "KETONESUR", "PROTEINUR", "UROBILINOGEN", "NITRITE", "LEUKOCYTESUR" in the last 72 hours.  Invalid input(s): "APPERANCEUR"    Imaging: DG Chest Port 1 View  Result Date: 10/26/2023 CLINICAL DATA:  Cough and shortness of breath EXAM: PORTABLE CHEST 1 VIEW COMPARISON:  09/27/2023 FINDINGS: Cardiac shadow is within normal limits. The lungs are well aerated bilaterally. No focal infiltrate is seen. No bony abnormality is noted. IMPRESSION: No active disease. Electronically Signed   By: Alcide Clever M.D.   On: 10/26/2023 21:50     Medications:    anticoagulant sodium citrate     cefTRIAXone (ROCEPHIN)  IV Stopped (10/27/23 0251)    amLODipine  10 mg Oral Daily   aspirin EC  81 mg Oral Daily   carvedilol  6.25 mg Oral BID WC   Chlorhexidine Gluconate Cloth  6 each Topical Q0600   DULoxetine  60 mg Oral Daily   enoxaparin (LOVENOX) injection  45 mg Subcutaneous Q24H   ezetimibe  10 mg Oral Daily   guaiFENesin  600 mg Oral BID   losartan  100 mg Oral Daily   methylPREDNISolone (SOLU-MEDROL) injection  40 mg Intravenous Q12H   Followed by   Melene Muller ON 10/28/2023] predniSONE  40 mg Oral Q breakfast   topiramate  50 mg Oral BID   acetaminophen **OR** acetaminophen, alteplase, anticoagulant sodium citrate, chlorpheniramine-HYDROcodone, feeding supplement (NEPRO CARB STEADY), heparin, heparin, lidocaine (PF), lidocaine-prilocaine, magnesium hydroxide, ondansetron **OR** ondansetron (ZOFRAN) IV, oxyCODONE, pantoprazole, pentafluoroprop-tetrafluoroeth, traZODone  Assessment/ Plan:  57 y.o. male with  was admitted on  10/26/2023 for  Principal Problem:   Acute on chronic respiratory failure with hypoxia and hypercapnia (HCC) Active Problems:   ESRD on hemodialysis (HCC)   Essential hypertension   GERD without esophagitis   COPD with acute exacerbation (HCC)   Dyslipidemia   Chronic diastolic CHF (congestive heart failure) (HCC)  Acute on chronic respiratory failure with hypoxia and hypercapnia (HCC) [Z61.09, J96.22]  Outpatient dialysis-[facility]/TTS/Lt AVF  #. ESRD, hyperkalemia Patient missed 2 dialysis treatment due to GI illness/diarrhea and now he is short of breath and has developed hyperkalemia Urgent hemodialysis treatment requested and dialysis nurse has been notified, orders placed. After today's dialysis, we will maintain TTS schedule  #. Anemia of CKD  Lab Results  Component Value Date   HGB 10.7 (L) 10/27/2023   Low dose EPO with HD if hemoglobin is trending lower.  #. Secondary hyperparathyroidism of renal origin N 25.81      Component Value Date/Time   PTH 135 (H) 12/07/2021 1554   PTH Comment 12/07/2021 1554   Lab Results  Component Value Date   PHOS 5.6 (H) 10/03/2023   Monitor calcium and phos level during this admission  #.  COPD exacerbation Patient is a current smoker.  Strongly recommended to quit smoking. Currently receiving DuoNeb, Mucinex, Proventil, IV Solu-Medrol, oral prednisone -Start nicotine patch.   LOS: 0 Derek Cooley 11/17/202412:10 PM  [facility] [address] [place], [place] [phone]

## 2023-10-27 NOTE — H&P (Addendum)
[facility]   PATIENT NAME: Derek Cooley    MR#:  [id number]  DATE OF BIRTH:  [birth year]  DATE OF ADMISSION:  10/26/2023  PRIMARY CARE PHYSICIAN: Larae Grooms, NP   Patient is coming from: Home  REQUESTING/REFERRING PHYSICIAN: Miki Kins, MD  CHIEF COMPLAINT:  Shortness of breath  HISTORY OF PRESENT ILLNESS:  Derek Jasmine. is a 57 y.o. male with medical history significant for end-stage renal disease on hemodialysis, GERD, OSA, COPD, depression, panic disorder, type 2 diabetes mellitus and asthma, who presented to the emergency room with acute onset of worsening dyspnea with associated fatigue.  The patient missed hemodialysis on Tuesday and Saturday.  He admitted to associated productive cough for the last few days with wheezing.  No fever or chills.  No nausea or vomiting or abdominal pain.  No chest pain or palpitations.  ED Course: When the patient came to the ER, BP was 170/85 with heart rate of 117 with respiratory rate of 22 and possibly 87% on room air and 100% on 3 L O2 by nasal cannula.  Later on the patient had increased work of breathing and was placed on BiPAP.  VBG showed pH 7.24 with HCO3 of 30 and pCO2 of 70 with pO2 40.  CMP revealed a BUN of 52 and creatinine 6.13 with calcium 8.3 and otherwise unremarkable CMP.  CBC showed leukocytosis 13 with neutrophilia as well as anemia better than previous levels with microcytosis. EKG as reviewed by me : EKG showed sinus rhythm with rate of 87 with right atrial enlargement and anterior Q waves. Imaging: Portable chest x-ray showed no acute cardiopulmonary disease.  The patient was given 125 mg IV Solu-Medrol, 2 g of IV magnesium sulfate, 10 mg of IV hydralazine, nebulized albuterol 5 mg and DuoNeb as well as 100 mg p.o. doxycycline.  He will be admitted to a progressive unit bed for further evaluation and management. PAST MEDICAL HISTORY:   Past Medical History:  Diagnosis Date   Abdominal abscess     a.) chronic MRSA infection   Acute pancreatitis    Anxiety    Asthma    Brain aneurysm    a.) 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 b.) RIGHT crainotomy for clip ligation 05/14/2019.   CHF (congestive heart failure) (HCC)    Chronic back pain    Chronic heart failure with preserved ejection fraction (HFpEF) (HCC)    a. 11/2021 Echo: EF 55-60%, no rwma, mod LVH, GrI DD. Nl RV size/fxn. Mildly dil LA.   CKD (chronic kidney disease), stage V (HCC)    Depression    Emphysema of lung (HCC)    Erectile dysfunction    Followed by palliative care service    GERD (gastroesophageal reflux disease)    History of methicillin resistant staphylococcus aureus (MRSA)    HLD (hyperlipidemia)    Hypertension    Mild cognitive impairment    MRSA (methicillin resistant Staphylococcus aureus)    Obesity    OSA (obstructive sleep apnea) 2013   a.) not compliant with nocturnal PAP therapy; CPAP machine "lost or stolen".   Panic disorder    Perforated bowel (HCC) 12/10/2005   Tempoary Colostomy Bag, Skin Graft for Abd wound   Polysubstance abuse (HCC)    a.) ETOH, tobacco, marijuana, methamphetamines, cocaine, BZO, opioids.   Subarachnoid hemorrhage (HCC) 01/26/2010   a.) spontaneous rupture --> SAH from RIGHT PComm --> coil embolization  01/26/2010 with known remaining neck remnant.   T2DM (type 2 diabetes mellitus) (HCC)    Tobacco abuse    Type 2 diabetes mellitus without complication, without long-term current use of insulin (HCC) 04/17/2016   Vitreous hemorrhage (HCC) 03/27/2011   Overview:  Bilateral; 01/2010, from [facility]     PAST SURGICAL HISTORY:   Past Surgical History:  Procedure Laterality Date   A/V FISTULAGRAM Left 07/16/2022   Procedure: A/V Fistulagram;  Surgeon: Annice Needy, MD;  Location: [facility];  Service: Cardiovascular;  Laterality: Left;   AV FISTULA PLACEMENT Left 05/24/2022   Procedure:  ARTERIOVENOUS (AV) FISTULA CREATION ( RADIAL CEPHALIC);  Surgeon: Annice Needy, MD;  Location: [facility] OR[facility];  Service: Vascular;  Laterality: Left;   CEREBRAL ANEURYSM REPAIR Right 01/26/2010   Procedure: CEREBRAL ANEURYSM REPAIR (COIL EMBOLIZATION)   CEREBRAL ANEURYSM REPAIR Right 05/14/2019   Procedure: CRAINOTOMY FOR CEREBRAL ANEURYSM REPAIR (CLIP LIGATION)   COLON SURGERY  12/10/2005   colostomy bag placed s/p perforated bowel   COLONOSCOPY WITH PROPOFOL N/A 05/18/2020   Procedure: COLONOSCOPY WITH PROPOFOL;  Surgeon: Toney Reil, MD;  Location: [facility];  Service: Gastroenterology;  Laterality: N/A;   DIALYSIS/PERMA CATHETER INSERTION N/A 06/27/2022   Procedure: DIALYSIS/PERMA CATHETER INSERTION;  Surgeon: Renford Dills, MD;  Location: [facility];  Service: Cardiovascular;  Laterality: N/A;   DIALYSIS/PERMA CATHETER INSERTION N/A 06/28/2023   Procedure: DIALYSIS/PERMA CATHETER INSERTION;  Surgeon: Renford Dills, MD;  Location: [facility];  Service: Cardiovascular;  Laterality: N/A;   DIALYSIS/PERMA CATHETER REMOVAL N/A 05/20/2023   Procedure: DIALYSIS/PERMA CATHETER REMOVAL;  Surgeon: Annice Needy, MD;  Location: [facility];  Service: Cardiovascular;  Laterality: N/A;   DIALYSIS/PERMA CATHETER REMOVAL N/A 09/09/2023   Procedure: DIALYSIS/PERMA CATHETER REMOVAL;  Surgeon: Annice Needy, MD;  Location: [facility];  Service: Cardiovascular;  Laterality: N/A;   KNEE SURGERY Right    Perforated bowel      SOCIAL HISTORY:   Social History   Tobacco Use   Smoking status: Every Day    Current packs/day: 1.00    Average packs/day: 1 pack/day for 40.0 years (40.0 ttl pk-yrs)    Types: Cigarettes    Start date: 10/11/1984   Smokeless tobacco: Never  Substance Use Topics   Alcohol use: Yes    FAMILY HISTORY:   Family History  Problem Relation Age of Onset   Arthritis Mother    Asthma Mother    Diabetes Mother    Heart disease  Mother    Hyperlipidemia Mother    Hypertension Mother    Kidney disease Mother    Thyroid disease Mother    Lung disease Mother    Anxiety disorder Mother    Depression Mother    Diabetes Father    Heart disease Father    Depression Father    Anxiety disorder Father    Arthritis Sister    Asthma Sister    Hyperlipidemia Sister    Hypertension Sister    Lung disease Sister    Anxiety disorder Sister    Depression Sister    Hyperlipidemia Brother    Hypertension Brother    Diabetes Sister    Heart disease Sister    Depression Sister    Anxiety disorder Sister    Anxiety disorder Brother    Depression Brother    Heart disease Brother     DRUG ALLERGIES:   Allergies  Allergen Reactions  Atorvastatin     Joint Aches - Severe Joint Aches - Severe Joint Aches - Severe   Avelox [Moxifloxacin Hcl In Nacl]     Muscle pain   Buprenorphine     Mouth sores, confusion, shaking   Dilaudid [Hydromorphone Hcl] Hives   Fluoxetine Itching   Levofloxacin Other (See Comments)    Joint Pain   Morphine And Codeine    Other     Muscle pain   Suboxone [Buprenorphine Hcl-Naloxone Hcl] Other (See Comments)    Rash and confused   Vancomycin     Renal insufficiency    REVIEW OF SYSTEMS:   ROS As per history of present illness. All pertinent systems were reviewed above. Constitutional, HEENT, cardiovascular, respiratory, GI, GU, musculoskeletal, neuro, psychiatric, endocrine, integumentary and hematologic systems were reviewed and are otherwise negative/unremarkable except for positive findings mentioned above in the HPI.   MEDICATIONS AT HOME:   Prior to Admission medications   Medication Sig Start Date End Date Taking? Authorizing Provider  doxycycline (VIBRAMYCIN) 100 MG capsule Take 1 capsule (100 mg total) by mouth 2 (two) times daily for 10 days. 10/26/23 11/05/23 Yes Sharman Cheek, MD  predniSONE (DELTASONE) 20 MG tablet Take 2 tablets (40 mg total) by mouth daily  with breakfast for 4 days. 10/26/23 10/30/23 Yes Sharman Cheek, MD  albuterol (VENTOLIN HFA) 108 (90 Base) MCG/ACT inhaler Inhale 1-2 puffs into the lungs every 6 (six) hours as needed for wheezing or shortness of breath. 09/18/23   Loyce Dys, MD  amLODipine (NORVASC) 10 MG tablet Take 10 mg by mouth daily.    [provider]  aspirin EC 81 MG tablet Take 1 tablet (81 mg total) by mouth daily. Swallow whole. 10/01/23   Tresa Moore, MD  carvedilol (COREG) 6.25 MG tablet Take 1 tablet (6.25 mg total) by mouth 2 (two) times daily with a meal. 10/01/23 10/31/23  Sreenath, Sudheer B, MD  DULoxetine (CYMBALTA) 60 MG capsule Take 1 capsule (60 mg total) by mouth daily. 10/01/23 12/30/23  Tresa Moore, MD  ezetimibe (ZETIA) 10 MG tablet Take 1 tablet (10 mg total) by mouth daily. 10/01/23 10/31/23  Tresa Moore, MD  lidocaine-prilocaine (EMLA) cream Apply 1 Application topically daily. 08/23/22   [provider]  losartan (COZAAR) 100 MG tablet Take 100 mg by mouth daily.    [provider]  oxyCODONE (OXY IR/ROXICODONE) 5 MG immediate release tablet Take 1 tablet (5 mg total) by mouth every 4 (four) hours as needed for severe pain (pain score 7-10). 10/01/23   Tresa Moore, MD  OXYGEN Inhale 3 L into the lungs daily.    [provider]  pantoprazole (PROTONIX) 40 MG tablet TAKE 1 TABLET(40 MG) BY MOUTH TWICE DAILY AS NEEDED 01/23/23   Larae Grooms, NP  topiramate (TOPAMAX) 50 MG tablet TAKE 1 TABLET(50 MG) BY MOUTH TWICE DAILY 01/07/23   Larae Grooms, NP  TRELEGY ELLIPTA 100-62.5-25 MCG/ACT AEPB Inhale 1 puff into the lungs daily. 06/10/23   Mecum, Erin E, PA-C      VITAL SIGNS:  Blood pressure (!) 179/94, pulse 95, temperature 97.6 F (36.4 C), temperature source Oral, resp. rate (!) 22, weight 89.5 kg, SpO2 94%.  PHYSICAL EXAMINATION:  Physical Exam  GENERAL: Acutely ill 57 y.o.-year-old patient lying in the bed with mild  respiratory distress on BiPAP with conversational dyspnea.  He was fairly somnolent but would respond to commands. EYES: Pupils equal, round, reactive to light and accommodation. No scleral  icterus. Extraocular muscles intact.  HEENT: Head atraumatic, normocephalic. Oropharynx and nasopharynx clear.  NECK:  Supple, no jugular venous distention. No thyroid enlargement, no tenderness.  LUNGS: Diffuse expiratory wheezes with tight expiratory airflow and harsh vesicular breathing.  No use of accessory muscles of respiration.  CARDIOVASCULAR: Regular rate and rhythm, S1, S2 normal. No murmurs, rubs, or gallops.  ABDOMEN: Soft, nondistended, nontender. Bowel sounds present. No organomegaly or mass.  EXTREMITIES: No pedal edema, cyanosis, or clubbing.  NEUROLOGIC: Cranial nerves II through XII are intact. Muscle strength 5/5 in all extremities. Sensation intact. Gait not checked.  PSYCHIATRIC: The patient is very somnolent but arousable.   SKIN: No obvious rash, lesion, or ulcer.   LABORATORY PANEL:   CBC Recent Labs  Lab 10/26/23 2232  WBC 13.0*  HGB 11.0*  HCT 34.4*  PLT 220   ------------------------------------------------------------------------------------------------------------------  Chemistries  Recent Labs  Lab 10/26/23 2232  NA 142  K 4.1  CL 105  CO2 27  GLUCOSE 139*  BUN 52*  CREATININE 6.13*  CALCIUM 8.3*  MG 2.4  AST 15  ALT 11  ALKPHOS 98  BILITOT 0.5   ------------------------------------------------------------------------------------------------------------------  Cardiac Enzymes No results for input(s): "TROPONINI" in the last 168 hours. ------------------------------------------------------------------------------------------------------------------  RADIOLOGY:  DG Chest Port 1 View  Result Date: 10/26/2023 CLINICAL DATA:  Cough and shortness of breath EXAM: PORTABLE CHEST 1 VIEW COMPARISON:  09/27/2023 FINDINGS: Cardiac shadow is within normal  limits. The lungs are well aerated bilaterally. No focal infiltrate is seen. No bony abnormality is noted. IMPRESSION: No active disease. Electronically Signed   By: Alcide Clever M.D.   On: 10/26/2023 21:50      IMPRESSION AND PLAN:  Assessment and Plan: * Acute on chronic respiratory failure with hypoxia and hypercapnia (HCC) - Patient will be admitted to a progressive unit bed. - Continue BiPAP overnight. - O2 protocol will be followed. - Management otherwise as below.  COPD with acute exacerbation (HCC) - We will place the patient IV steroid therapy with IV Solu-Medrol as well as nebulized bronchodilator therapy with duonebs q.i.d. and q.4 hours p.r.n.Marland Kitchen - Mucolytic therapy will be provided with Mucinex and antibiotic therapy with IV Rocephin. - Will hold off long-acting beta agonist. - O2 protocol will be followed.   Essential hypertension - We will continue antihypertensive therapy.  ESRD on hemodialysis (HCC) - He does not seem to be in acute volume overload at this time. - Nephrology consult to be obtained. - I notified Dr. Thedore Mins about the patient.  Chronic diastolic CHF (congestive heart failure) (HCC) - We will continue Coreg and Cozaar.  Dyslipidemia - We will continue Zetia  GERD without esophagitis - Continue PPI therapy.   DVT prophylaxis: Lovenox.  Advanced Care Planning:  Code Status: full code.  Family Communication:  The plan of care was discussed in details with the patient (and family). I answered all questions. The patient agreed to proceed with the above mentioned plan. Further management will depend upon hospital course. Disposition Plan: Back to previous home environment Consults called: none.  All the records are reviewed and case discussed with ED provider.  Status is: Inpatient  At the time of the admission, it appears that the appropriate admission status for this patient is inpatient.  This is judged to be reasonable and necessary in order to  provide the required intensity of service to ensure the patient's safety given the presenting symptoms, physical exam findings and initial radiographic and laboratory data in the context of comorbid  conditions.  The patient requires inpatient status due to high intensity of service, high risk of further deterioration and high frequency of surveillance required.  I certify that at the time of admission, it is my clinical judgment that the patient will require inpatient hospital care extending more than 2 midnights.                            Dispo: The patient is from: Home              Anticipated d/c is to: Home              Patient currently is not medically stable to d/c.              Difficult to place patient: No  Authorized and performed by: Valente David, MD Total critical care time:  55      minutes. Due to a high probability of clinically significant, life-threatening deterioration, the patient required my highest level of preparedness to intervene emergently and I personally spent this critical care time directly and personally managing the patient.  This critical care time included obtaining a history, examining the patient, pulse oximetry, ordering and review of studies, arranging urgent treatment with development of management plan, evaluation of patient's response to treatment, frequent reassessment, and discussions with other providers. This critical care time was performed to assess and manage the high probability of imminent, life-threatening deterioration that could result in multiorgan failure.  It was exclusive of separately billable procedures and treating other patients and teaching time.   Hannah Beat M.D on 10/27/2023 at 2:47 AM  [facility]   From 7 PM-7 AM, contact night-coverage [email or web address]  CC: Primary care physician; Larae Grooms, NP

## 2023-10-27 NOTE — Assessment & Plan Note (Signed)
-   We will continue anti-hypertensive therapy. 

## 2023-10-27 NOTE — Assessment & Plan Note (Signed)
-   We will continue Zetia.

## 2023-10-27 NOTE — Progress Notes (Addendum)
1230: attempted to set up for HD but there is an issue with faucet connectors. Facilities was called by ED RN.  1400: Facilities attempted to fix the faucet issue but was unsuccessful. Patient was transferred to a different room and HD was started.

## 2023-10-27 NOTE — ED Notes (Signed)
This Rn came to room, and patient is sitting up in bed eating his lunch tray. Pt complains of the noise that the BIPAP machine is making, and wanting the machine to be turned off. Respiratory called to bedside to assist with pt's concerns.

## 2023-10-27 NOTE — Assessment & Plan Note (Signed)
Continue PPI therapy. 

## 2023-10-27 NOTE — Progress Notes (Signed)
PROGRESS NOTE    Derek Cooley.  ZOX:[id number] DOB: 02-01-1966 DOA: 10/26/2023 PCP: Larae Grooms, NP    Assessment & Plan:   Principal Problem:   Acute on chronic respiratory failure with hypoxia and hypercapnia (HCC) Active Problems:   COPD with acute exacerbation (HCC)   ESRD on hemodialysis (HCC)   Essential hypertension   Dyslipidemia   Chronic diastolic CHF (congestive heart failure) (HCC)   GERD without esophagitis  Assessment and Plan: Acute on chronic hypoxic & hypercapnic respiratory failure: continue on BiPAP and wean as tolerated.    COPD exacerbation:  continue on IV steroids, bronchodilators, encourage incentive spirometry. Continue on BiPAP and wean as tolerated    Hyperkalemia: needs HD    HTN: continue on coreg, losartan   ESRD: on HD.    Chronic diastolic CHF: continue on coreg, losartan  HLD: continue on zetia    GERD: continue on PPI   Obesity: BMI 30.9. Would benefit from weight loss    DVT prophylaxis: heparin  Code Status: full  Family Communication:  Disposition Plan: likely d/c back home   Level of care: Progressive Status is: Inpatient Remains inpatient appropriate because: severity of illness    Consultants:  Nephro  Procedures:   Antimicrobials:    Subjective: Pt c/o shortness of breath   Objective: Vitals:   10/27/23 0700 10/27/23 0754 10/27/23 0800 10/27/23 0830  BP: (!) 181/88  (!) 168/87 (!) 154/75  Pulse: 83 84 84 83  Resp: 18 (!) 21 18 15   Temp:      TempSrc:      SpO2: 100% 100%    Weight:        Intake/Output Summary (Last 24 hours) at 10/27/2023 0853 Last data filed at 10/27/2023 0251 Gross per 24 hour  Intake 150 ml  Output --  Net 150 ml   Filed Weights   10/27/23 0100  Weight: 89.5 kg    Examination:  General exam: Appears calm and comfortable  Respiratory system: diminished breath sounds b/l. Cardiovascular system: S1 & S2 +. No  rubs, gallops or clicks.  Gastrointestinal  system: Abdomen is obese, soft and nontender. Normal bowel sounds heard. Central nervous system: Alert and oriented. Moves all extremities  Psychiatry: Judgement and insight appear normal. Flat mood and affect     Data Reviewed: I have personally reviewed following labs and imaging studies  CBC: Recent Labs  Lab 10/26/23 2232 10/27/23 0644  WBC 13.0* 14.1*  NEUTROABS 10.6*  --   HGB 11.0* 10.7*  HCT 34.4* 32.5*  MCV 101.5* 97.3  PLT 220 215   Basic Metabolic Panel: Recent Labs  Lab 10/26/23 2232 10/27/23 0644  NA 142 138  K 4.1 6.0*  CL 105 104  CO2 27 22  GLUCOSE 139* 193*  BUN 52* 55*  CREATININE 6.13* 6.53*  CALCIUM 8.3* 8.3*  MG 2.4  --    GFR: Estimated Creatinine Clearance: 13.3 mL/min (A) (by C-G formula based on SCr of 6.53 mg/dL (H)). Liver Function Tests: Recent Labs  Lab 10/26/23 2232  AST 15  ALT 11  ALKPHOS 98  BILITOT 0.5  PROT 7.7  ALBUMIN 3.9   No results for input(s): "LIPASE", "AMYLASE" in the last 168 hours. No results for input(s): "AMMONIA" in the last 168 hours. Coagulation Profile: No results for input(s): "INR", "PROTIME" in the last 168 hours. Cardiac Enzymes: No results for input(s): "CKTOTAL", "CKMB", "CKMBINDEX", "TROPONINI" in the last 168 hours. BNP (last 3 results) No results for input(s): "PROBNP"  in the last 8760 hours. HbA1C: No results for input(s): "HGBA1C" in the last 72 hours. CBG: Recent Labs  Lab 10/27/23 0608  GLUCAP 180*   Lipid Profile: No results for input(s): "CHOL", "HDL", "LDLCALC", "TRIG", "CHOLHDL", "LDLDIRECT" in the last 72 hours. Thyroid Function Tests: No results for input(s): "TSH", "T4TOTAL", "FREET4", "T3FREE", "THYROIDAB" in the last 72 hours. Anemia Panel: No results for input(s): "VITAMINB12", "FOLATE", "FERRITIN", "TIBC", "IRON", "RETICCTPCT" in the last 72 hours. Sepsis Labs: No results for input(s): "PROCALCITON", "LATICACIDVEN" in the last 168 hours.  No results found for this or  any previous visit (from the past 240 hour(s)).       Radiology Studies: DG Chest Port 1 View  Result Date: 10/26/2023 CLINICAL DATA:  Cough and shortness of breath EXAM: PORTABLE CHEST 1 VIEW COMPARISON:  09/27/2023 FINDINGS: Cardiac shadow is within normal limits. The lungs are well aerated bilaterally. No focal infiltrate is seen. No bony abnormality is noted. IMPRESSION: No active disease. Electronically Signed   By: Alcide Clever M.D.   On: 10/26/2023 21:50        Scheduled Meds:  amLODipine  10 mg Oral Daily   aspirin EC  81 mg Oral Daily   carvedilol  6.25 mg Oral BID WC   DULoxetine  60 mg Oral Daily   enoxaparin (LOVENOX) injection  45 mg Subcutaneous Q24H   ezetimibe  10 mg Oral Daily   guaiFENesin  600 mg Oral BID   losartan  100 mg Oral Daily   methylPREDNISolone (SOLU-MEDROL) injection  40 mg Intravenous Q12H   Followed by   Melene Muller ON 10/28/2023] predniSONE  40 mg Oral Q breakfast   topiramate  50 mg Oral BID   Continuous Infusions:  cefTRIAXone (ROCEPHIN)  IV Stopped (10/27/23 0251)     LOS: 0 days     Charise Killian, MD [facility] Pager [phone]  If 7PM-7AM, please contact night-coverage [email or web address] 10/27/2023, 8:53 AM

## 2023-10-27 NOTE — Assessment & Plan Note (Signed)
-   Patient will be admitted to a progressive unit bed. - Continue BiPAP overnight. - O2 protocol will be followed. - Management otherwise as below.

## 2023-10-27 NOTE — Assessment & Plan Note (Signed)
-   He does not seem to be in acute volume overload at this time. - Nephrology consult to be obtained. - I notified Dr. Thedore Mins about the patient.

## 2023-10-27 NOTE — Assessment & Plan Note (Signed)
-   We will continue Coreg and Cozaar.

## 2023-10-28 ENCOUNTER — Encounter: Payer: Self-pay | Admitting: Nurse Practitioner

## 2023-10-28 ENCOUNTER — Encounter: Payer: Self-pay | Admitting: Family Medicine

## 2023-10-28 DIAGNOSIS — J441 Chronic obstructive pulmonary disease with (acute) exacerbation: Secondary | ICD-10-CM | POA: Diagnosis not present

## 2023-10-28 LAB — CBC
HCT: 31.7 % — ABNORMAL LOW (ref 39.0–52.0)
Hemoglobin: 10.6 g/dL — ABNORMAL LOW (ref 13.0–17.0)
MCH: 32.5 pg (ref 26.0–34.0)
MCHC: 33.4 g/dL (ref 30.0–36.0)
MCV: 97.2 fL (ref 80.0–100.0)
Platelets: 219 10*3/uL (ref 150–400)
RBC: 3.26 MIL/uL — ABNORMAL LOW (ref 4.22–5.81)
RDW: 13.2 % (ref 11.5–15.5)
WBC: 14.2 10*3/uL — ABNORMAL HIGH (ref 4.0–10.5)
nRBC: 0 % (ref 0.0–0.2)

## 2023-10-28 LAB — BLOOD GAS, ARTERIAL
Acid-Base Excess: 2.4 mmol/L — ABNORMAL HIGH (ref 0.0–2.0)
Bicarbonate: 30.8 mmol/L — ABNORMAL HIGH (ref 20.0–28.0)
Delivery systems: POSITIVE
Expiratory PAP: 5 cm[H2O]
FIO2: 35 %
Inspiratory PAP: 12 cm[H2O]
O2 Saturation: 98.5 %
Patient temperature: 37
pCO2 arterial: 64 mm[Hg] — ABNORMAL HIGH (ref 32–48)
pH, Arterial: 7.29 — ABNORMAL LOW (ref 7.35–7.45)
pO2, Arterial: 94 mm[Hg] (ref 83–108)

## 2023-10-28 LAB — BASIC METABOLIC PANEL
Anion gap: 10 (ref 5–15)
BUN: 45 mg/dL — ABNORMAL HIGH (ref 6–20)
CO2: 26 mmol/L (ref 22–32)
Calcium: 8.3 mg/dL — ABNORMAL LOW (ref 8.9–10.3)
Chloride: 93 mmol/L — ABNORMAL LOW (ref 98–111)
Creatinine, Ser: 4.67 mg/dL — ABNORMAL HIGH (ref 0.61–1.24)
GFR, Estimated: 14 mL/min — ABNORMAL LOW (ref >60)
Glucose, Bld: 202 mg/dL — ABNORMAL HIGH (ref 70–99)
Potassium: 4.6 mmol/L (ref 3.5–5.1)
Sodium: 129 mmol/L — ABNORMAL LOW (ref 135–145)

## 2023-10-28 MED ORDER — HALOPERIDOL LACTATE 5 MG/ML IJ SOLN
5.0000 mg | Freq: Once | INTRAMUSCULAR | Status: AC
  Administered 2023-10-28: 5 mg via INTRAMUSCULAR
  Filled 2023-10-28: qty 1

## 2023-10-28 MED ORDER — HALOPERIDOL LACTATE 5 MG/ML IJ SOLN
5.0000 mg | Freq: Once | INTRAMUSCULAR | Status: AC
  Administered 2023-10-29: 5 mg via INTRAMUSCULAR
  Filled 2023-10-28: qty 1

## 2023-10-28 NOTE — ED Notes (Signed)
ED TO INPATIENT HANDOFF REPORT  ED Nurse Name and Phone #: [phone]  S Name/Age/Gender Derek Cooley. 57 y.o. male Room/Bed: ED04A/ED04A  Code Status   Code Status: Full Code  Home/SNF/Other Home Patient oriented to: self, place, time, and situation Is this baseline? Yes   Triage Complete: Triage complete  Chief Complaint Acute on chronic respiratory failure with hypoxia and hypercapnia (HCC) [Z61.09, J96.22]  Triage Note Pt was supposed to get dialysis tues, thurs, sat- only did Thursday this week. Pt states he felt too weak to go. Pt on 3 liters chronically- 87% percent on arrival. Pt c/o chronic left shoulder pain. Pt is AOX4, respirations even- dyspneic at rest. Pt w/ productive cough and nasal congestion.    Allergies Allergies  Allergen Reactions   Codeine Hives and Other (See Comments)   Atorvastatin     Joint Aches - Severe Joint Aches - Severe Joint Aches - Severe   Avelox [Moxifloxacin Hcl In Nacl]     Muscle pain   Buprenorphine     Mouth sores, confusion, shaking   Dilaudid [Hydromorphone Hcl] Hives   Fluoxetine Itching   Levofloxacin Other (See Comments)    Joint Pain   Morphine    Other     Muscle pain   Suboxone [Buprenorphine Hcl-Naloxone Hcl] Other (See Comments)    Rash and confused   Vancomycin     Renal insufficiency    Level of Care/Admitting Diagnosis ED Disposition     ED Disposition  Admit   Condition  --   Comment  Hospital Area: [facility] [[id number]]  Level of Care: Progressive [102]  Admit to Progressive based on following criteria: RESPIRATORY PROBLEMS hypoxemic/hypercapnic respiratory failure that is responsive to NIPPV (BiPAP) or High Flow Nasal Cannula (6-80 lpm). Frequent assessment/intervention, no > Q2 hrs < Q4 hrs, to maintain oxygenation and pulmonary hygiene.  Covid Evaluation: Asymptomatic - no recent exposure (last 10 days) testing not required  Diagnosis: Acute on chronic respiratory failure  with hypoxia and hypercapnia [facility]) [[id number]]  Admitting Physician: Hannah Beat [[id number]]  Attending Physician: Hannah Beat [[id number]]  Certification:: I certify this patient will need inpatient services for at least 2 midnights  Expected Medical Readiness: 10/29/2023          B Medical/Surgery History Past Medical History:  Diagnosis Date   Abdominal abscess    a.) chronic MRSA infection   Acute pancreatitis    Anxiety    Asthma    Brain aneurysm    a.) 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 b.) RIGHT crainotomy for clip ligation 05/14/2019.   CHF (congestive heart failure) (HCC)    Chronic back pain    Chronic heart failure with preserved ejection fraction (HFpEF) (HCC)    a. 11/2021 Echo: EF 55-60%, no rwma, mod LVH, GrI DD. Nl RV size/fxn. Mildly dil LA.   CKD (chronic kidney disease), stage V (HCC)    Depression    Emphysema of lung (HCC)    Erectile dysfunction    Followed by palliative care service    GERD (gastroesophageal reflux disease)    History of methicillin resistant staphylococcus aureus (MRSA)    HLD (hyperlipidemia)    Hypertension    Mild cognitive impairment    MRSA (methicillin resistant Staphylococcus aureus)    Obesity    OSA (obstructive sleep apnea) 2013   a.) not compliant with nocturnal PAP therapy; CPAP machine "lost or  stolen".   Panic disorder    Perforated bowel (HCC) 12/10/2005   Tempoary Colostomy Bag, Skin Graft for Abd wound   Polysubstance abuse (HCC)    a.) ETOH, tobacco, marijuana, methamphetamines, cocaine, BZO, opioids.   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   T2DM (type 2 diabetes mellitus) (HCC)    Tobacco abuse    Type 2 diabetes mellitus without complication, without long-term current use of insulin (HCC) 04/17/2016   Vitreous  hemorrhage (HCC) 03/27/2011   Overview:  Bilateral; 01/2010, from [facility]    Past Surgical History:  Procedure Laterality Date   A/V FISTULAGRAM Left 07/16/2022   Procedure: A/V Fistulagram;  Surgeon: Annice Needy, MD;  Location: [facility] INVASIVE CV LAB;  Service: Cardiovascular;  Laterality: Left;   AV FISTULA PLACEMENT Left 05/24/2022   Procedure: ARTERIOVENOUS (AV) FISTULA CREATION ( RADIAL CEPHALIC);  Surgeon: Annice Needy, MD;  Location: [facility] ORS;  Service: Vascular;  Laterality: Left;   CEREBRAL ANEURYSM REPAIR Right 01/26/2010   Procedure: CEREBRAL ANEURYSM REPAIR (COIL EMBOLIZATION)   CEREBRAL ANEURYSM REPAIR Right 05/14/2019   Procedure: CRAINOTOMY FOR CEREBRAL ANEURYSM REPAIR (CLIP LIGATION)   COLON SURGERY  12/10/2005   colostomy bag placed s/p perforated bowel   COLONOSCOPY WITH PROPOFOL N/A 05/18/2020   Procedure: COLONOSCOPY WITH PROPOFOL;  Surgeon: Toney Reil, MD;  Location: [facility] ENDOSCOPY;  Service: Gastroenterology;  Laterality: N/A;   DIALYSIS/PERMA CATHETER INSERTION N/A 06/27/2022   Procedure: DIALYSIS/PERMA CATHETER INSERTION;  Surgeon: Renford Dills, MD;  Location: [facility] CV [facility];  Service: Cardiovascular;  Laterality: N/A;   DIALYSIS/PERMA CATHETER INSERTION N/A 06/28/2023   Procedure: DIALYSIS/PERMA CATHETER INSERTION;  Surgeon: Renford Dills, MD;  Location: [facility];  Service: Cardiovascular;  Laterality: N/A;   DIALYSIS/PERMA CATHETER REMOVAL N/A 05/20/2023   Procedure: DIALYSIS/PERMA CATHETER REMOVAL;  Surgeon: Annice Needy, MD;  Location: [facility];  Service: Cardiovascular;  Laterality: N/A;   DIALYSIS/PERMA CATHETER REMOVAL N/A 09/09/2023   Procedure: DIALYSIS/PERMA CATHETER REMOVAL;  Surgeon: Annice Needy, MD;  Location: [facility] INVAS[facility] CV LAB;  Service: Cardiovascular;  Laterality: N/A;   KNEE SURGERY Right    Perforated bowel       A IV Location/Drains/Wounds Patient Lines/Drains/Airways Status     Active  Line/Drains/Airways     Name Placement date Placement time Site Days   Peripheral IV 10/26/23 20 G Anterior;Proximal;Right Forearm 10/26/23  2232  Forearm  2   Fistula / Graft Left Forearm Arteriovenous fistula 06/23/22  0000  Forearm  492            Intake/Output Last 24 hours  Intake/Output Summary (Last 24 hours) at 10/28/2023 0753 Last data filed at 10/27/2023 1745 Gross per 24 hour  Intake --  Output 2400 ml  Net -2400 ml    Labs/Imaging Results for orders placed or performed during the hospital encounter of 10/26/23 (from the past 48 hour(s))  Troponin I (High Sensitivity)     Status: Abnormal   Collection Time: 10/26/23 10:32 PM  Result Value Ref Range   Troponin I (High Sensitivity) 42 (H) <18 ng/L    Comment: (NOTE) Elevated high sensitivity troponin I (hsTnI) values and significant  changes across serial measurements may suggest ACS but many other  chronic and acute conditions are known to elevate hsTnI results.  Refer to the "Links" section for chest pain algorithms and additional  guidance.  Performed at [facility], [address] [place], [place] [address]   CBC with Differential     Status: Abnormal   Collection Time: 10/26/23 10:32 PM  Result Value Ref Range   WBC 13.0 (H) 4.0 - 10.5 K/uL   RBC 3.39 (L) 4.22 - 5.81 MIL/uL   Hemoglobin 11.0 (L) 13.0 - 17.0 g/dL   HCT 59.5 (L) 63.8 - 75.6 %   MCV 101.5 (H) 80.0 - 100.0 fL   MCH 32.4 26.0 - 34.0 pg   MCHC 32.0 30.0 - 36.0 g/dL   RDW 43.3 29.5 - 18.8 %   Platelets 220 150 - 400 K/uL   nRBC 0.0 0.0 - 0.2 %   Neutrophils Relative % 82 %   Neutro Abs 10.6 (H) 1.7 - 7.7 K/uL   Lymphocytes Relative 6 %   Lymphs Abs 0.8 0.7 - 4.0 K/uL   Monocytes Relative 10 %   Monocytes Absolute 1.3 (H) 0.1 - 1.0 K/uL   Eosinophils Relative 1 %   Eosinophils Absolute 0.1 0.0 - 0.5 K/uL   Basophils Relative 0 %   Basophils Absolute 0.1 0.0 - 0.1 K/uL   Immature Granulocytes 1 %   Abs Immature Granulocytes  0.17 (H) 0.00 - 0.07 K/uL    Comment: Performed at [facility], [address] [place], [place] [address]  Comprehensive metabolic panel     Status: Abnormal   Collection Time: 10/26/23 10:32 PM  Result Value Ref Range   Sodium 142 135 - 145 mmol/L   Potassium 4.1 3.5 - 5.1 mmol/L   Chloride 105 98 - 111 mmol/L   CO2 27 22 - 32 mmol/L   Glucose, Bld 139 (H) 70 - 99 mg/dL    Comment: Glucose reference range applies only to samples taken after fasting for at least 8 hours.   BUN 52 (H) 6 - 20 mg/dL   Creatinine, Ser 6.30 (H) 0.61 - 1.24 mg/dL   Calcium 8.3 (L) 8.9 - 10.3 mg/dL   Total Protein 7.7 6.5 - 8.1 g/dL   Albumin 3.9 3.5 - 5.0 g/dL   AST 15 15 - 41 U/L   ALT 11 0 - 44 U/L   Alkaline Phosphatase 98 38 - 126 U/L   Total Bilirubin 0.5 <1.2 mg/dL   GFR, Estimated 10 (L) >60 mL/min    Comment: (NOTE) Calculated using the CKD-EPI Creatinine Equation (2021)    Anion gap 10 5 - 15    Comment: Performed at [facility], [address] [place], [place] [address]  Magnesium     Status: None   Collection Time: 10/26/23 10:32 PM  Result Value Ref Range   Magnesium 2.4 1.7 - 2.4 mg/dL    Comment: Performed at [facility], [address], [place], [place] [address]  Troponin I (High Sensitivity)     Status: Abnormal   Collection Time: 10/26/23 11:36 PM  Result Value Ref Range   Troponin I (High Sensitivity) 44 (H) <18 ng/L    Comment: (NOTE) Elevated high sensitivity troponin I (hsTnI) values and significant  changes across serial measurements may suggest ACS but many other  chronic and acute conditions are known to elevate hsTnI results.  Refer to the "Links" section for chest pain algorithms and additional  guidance. Performed at [facility], [address], [place], [place] [address]   Blood gas, arterial     Status: Abnormal   Collection Time: 10/27/23 12:29 AM  Result Value Ref Range  Delivery systems BILEVEL POSITIVE AIRWAY  PRESSURE    pH, Arterial 7.24 (L) 7.35 - 7.45   pCO2 arterial 70 (HH) 32 - 48 mmHg    Comment: CRITICAL RESULT CALLED TO, READ BACK BY AND VERIFIED WITH: SEBASTIAN SIADECKI [id number] DT    pO2, Arterial 40 (LL) 83 - 108 mmHg    Comment: CRITICAL RESULT CALLED TO, READ BACK BY AND VERIFIED WITH: SEBASTIAN SIADECKI [id number] 0040 DT    Bicarbonate 30.0 (H) 20.0 - 28.0 mmol/L   Acid-Base Excess 0.7 0.0 - 2.0 mmol/L   O2 Saturation 71.9 %   Patient temperature 37.0    Collection site RIGHT RADIAL    Allens test (pass/fail) PASS PASS    Comment: Performed at [facility], [address] [place], [place] [address]  Blood gas, arterial     Status: Abnormal   Collection Time: 10/27/23  3:37 AM  Result Value Ref Range   FIO2 50 %   Delivery systems BILEVEL POSITIVE AIRWAY PRESSURE    Inspiratory PAP 12 cmH2O   Expiratory PAP 5 cmH2O   pH, Arterial 7.26 (L) 7.35 - 7.45   pCO2 arterial 61 (H) 32 - 48 mmHg   pO2, Arterial 77 (L) 83 - 108 mmHg   Bicarbonate 27.4 20.0 - 28.0 mmol/L   Acid-base deficit 1.0 0.0 - 2.0 mmol/L   O2 Saturation 97.4 %   Patient temperature 37.0    Collection site RIGHT RADIAL    Allens test (pass/fail) PASS PASS    Comment: Performed at [facility], [address] [place], [place] [address]  CBG monitoring, ED     Status: Abnormal   Collection Time: 10/27/23  6:08 AM  Result Value Ref Range   Glucose-Capillary 180 (H) 70 - 99 mg/dL    Comment: Glucose reference range applies only to samples taken after fasting for at least 8 hours.  Basic metabolic panel     Status: Abnormal   Collection Time: 10/27/23  6:44 AM  Result Value Ref Range   Sodium 138 135 - 145 mmol/L   Potassium 6.0 (H) 3.5 - 5.1 mmol/L   Chloride 104 98 - 111 mmol/L   CO2 22 22 - 32 mmol/L   Glucose, Bld 193 (H) 70 - 99 mg/dL    Comment: Glucose reference range applies only to samples taken after fasting for at least 8 hours.   BUN 55 (H) 6 - 20 mg/dL   Creatinine,  Ser 4.69 (H) 0.61 - 1.24 mg/dL   Calcium 8.3 (L) 8.9 - 10.3 mg/dL   GFR, Estimated 9 (L) >60 mL/min    Comment: (NOTE) Calculated using the CKD-EPI Creatinine Equation (2021)    Anion gap 12 5 - 15    Comment: Performed at [facility], [address] [place], [place] [address]  CBC     Status: Abnormal   Collection Time: 10/27/23  6:44 AM  Result Value Ref Range   WBC 14.1 (H) 4.0 - 10.5 K/uL   RBC 3.34 (L) 4.22 - 5.81 MIL/uL   Hemoglobin 10.7 (L) 13.0 - 17.0 g/dL   HCT 84.1 (L) 32.4 - 40.1 %   MCV 97.3 80.0 - 100.0 fL   MCH 32.0 26.0 - 34.0 pg   MCHC 32.9 30.0 - 36.0 g/dL   RDW 02.7 25.3 - 66.4 %   Platelets 215 150 - 400 K/uL   nRBC 0.2 0.0 - 0.2 %    Comment: Performed at [facility], [address] [place], [place] [address]  Hepatitis B surface antigen     Status: None   Collection Time: 10/27/23  2:37 PM  Result Value Ref Range   Hepatitis B Surface Ag NON REACTIVE NON REACTIVE    Comment: Performed at [facility], [address] [place], [place] [address]  CBC     Status: Abnormal   Collection Time: 10/28/23  2:33 AM  Result Value Ref Range   WBC 14.2 (H) 4.0 - 10.5 K/uL   RBC 3.26 (L) 4.22 - 5.81 MIL/uL   Hemoglobin 10.6 (L) 13.0 - 17.0 g/dL   HCT 34.7 (L) 42.5 - 95.6 %   MCV 97.2 80.0 - 100.0 fL   MCH 32.5 26.0 - 34.0 pg   MCHC 33.4 30.0 - 36.0 g/dL   RDW 38.7 56.4 - 33.2 %   Platelets 219 150 - 400 K/uL   nRBC 0.0 0.0 - 0.2 %    Comment: Performed at [facility], [address] [place], [place] [address]  Basic metabolic panel     Status: Abnormal   Collection Time: 10/28/23  2:33 AM  Result Value Ref Range   Sodium 129 (L) 135 - 145 mmol/L    Comment: REPEATED TO VERIFY   Potassium 4.6 3.5 - 5.1 mmol/L   Chloride 93 (L) 98 - 111 mmol/L   CO2 26 22 - 32 mmol/L   Glucose, Bld 202 (H) 70 - 99 mg/dL    Comment: Glucose reference range applies only to samples taken after fasting for at least 8 hours.   BUN 45 (H) 6 -  20 mg/dL   Creatinine, Ser 4.16 (H) 0.61 - 1.24 mg/dL   Calcium 8.3 (L) 8.9 - 10.3 mg/dL   GFR, Estimated 14 (L) >60 mL/min    Comment: (NOTE) Calculated using the CKD-EPI Creatinine Equation (2021)    Anion gap 10 5 - 15    Comment: Performed at [facility], [address] [place], [place] [address]   DG Chest Port 1 View  Result Date: 10/26/2023 CLINICAL DATA:  Cough and shortness of breath EXAM: PORTABLE CHEST 1 VIEW COMPARISON:  09/27/2023 FINDINGS: Cardiac shadow is within normal limits. The lungs are well aerated bilaterally. No focal infiltrate is seen. No bony abnormality is noted. IMPRESSION: No active disease. Electronically Signed   By: Alcide Clever M.D.   On: 10/26/2023 21:50    Pending Labs Unresulted Labs (From admission, onward)     Start     Ordered   10/28/23 0753  MRSA Next Gen by PCR, Nasal  Once,   R        10/28/23 0752   10/28/23 0500  CBC  Daily,   R      10/27/23 1502   10/28/23 0500  Basic metabolic panel  Daily,   R      10/27/23 1502   10/27/23 1436  Hepatitis B surface antibody,quantitative  Once,   R        10/27/23 1435            Vitals/Pain Today's Vitals   10/28/23 0729 10/28/23 0737 10/28/23 0741 10/28/23 0742  BP:   (!) 185/82   Pulse:   94   Resp:   19   Temp:    98.3 F (36.8 C)  TempSrc:    Oral  SpO2:   98%   Weight:      PainSc: 10-Worst pain ever 10-Worst pain ever  Isolation Precautions No active isolations  Medications Medications  aspirin EC tablet 81 mg (81 mg Oral Given 10/27/23 0944)  oxyCODONE (Oxy IR/ROXICODONE) immediate release tablet 5 mg (5 mg Oral Given 10/28/23 0738)  amLODipine (NORVASC) tablet 10 mg (10 mg Oral Given 10/27/23 0944)  carvedilol (COREG) tablet 6.25 mg (6.25 mg Oral Given 10/28/23 0738)  ezetimibe (ZETIA) tablet 10 mg (10 mg Oral Given by Other 10/27/23 1024)  losartan (COZAAR) tablet 100 mg (100 mg Oral Given by Other 10/27/23 1025)  DULoxetine (CYMBALTA) DR capsule 60 mg  (60 mg Oral Given by Other 10/27/23 1023)  pantoprazole (PROTONIX) EC tablet 40 mg (has no administration in time range)  topiramate (TOPAMAX) tablet 50 mg (50 mg Oral Given 10/27/23 2132)  cefTRIAXone (ROCEPHIN) 1 g in sodium chloride 0.9 % 100 mL IVPB (0 g Intravenous Stopped 10/28/23 0148)  methylPREDNISolone sodium succinate (SOLU-MEDROL) 40 mg/mL injection 40 mg (40 mg Intravenous Given 10/27/23 1803)    Followed by  predniSONE (DELTASONE) tablet 40 mg (40 mg Oral Given 10/28/23 0738)  acetaminophen (TYLENOL) tablet 650 mg (has no administration in time range)    Or  acetaminophen (TYLENOL) suppository 650 mg (has no administration in time range)  traZODone (DESYREL) tablet 25 mg (has no administration in time range)  magnesium hydroxide (MILK OF MAGNESIA) suspension 30 mL (has no administration in time range)  ondansetron (ZOFRAN) tablet 4 mg (has no administration in time range)    Or  ondansetron (ZOFRAN) injection 4 mg (has no administration in time range)  guaiFENesin (MUCINEX) 12 hr tablet 600 mg (600 mg Oral Given 10/27/23 2132)  chlorpheniramine-HYDROcodone (TUSSIONEX) 10-8 MG/5ML suspension 5 mL (has no administration in time range)  Chlorhexidine Gluconate Cloth 2 % PADS 6 each (6 each Topical Not Given 10/27/23 2043)  pentafluoroprop-tetrafluoroeth (GEBAUERS) aerosol 1 Application (has no administration in time range)  lidocaine (PF) (XYLOCAINE) 1 % injection 5 mL (has no administration in time range)  lidocaine-prilocaine (EMLA) cream 1 Application (has no administration in time range)  feeding supplement (NEPRO CARB STEADY) liquid 237 mL (has no administration in time range)  heparin injection 1,000 Units (has no administration in time range)  anticoagulant sodium citrate solution 5 mL (has no administration in time range)  alteplase (CATHFLO ACTIVASE) injection 2 mg (has no administration in time range)  heparin injection 1,800 Units (1,800 Units Dialysis Given 10/27/23  1415)  nicotine (NICODERM CQ - dosed in mg/24 hours) patch 14 mg (14 mg Transdermal Not Given 10/27/23 2208)  heparin injection 5,000 Units (5,000 Units Subcutaneous Given 10/28/23 0538)  azithromycin (ZITHROMAX) 500 mg in dextrose 5 % 250 mL IVPB (0 mg Intravenous Stopped 10/27/23 1924)  methylPREDNISolone sodium succinate (SOLU-MEDROL) 125 mg/2 mL injection 125 mg (125 mg Intravenous Given 10/26/23 2257)  ipratropium-albuterol (DUONEB) 0.5-2.5 (3) MG/3ML nebulizer solution 3 mL (3 mLs Nebulization Given 10/26/23 2254)  albuterol (PROVENTIL) (2.5 MG/3ML) 0.083% nebulizer solution 5 mg (5 mg Nebulization Given 10/26/23 2254)  magnesium sulfate IVPB 2 g 50 mL (0 g Intravenous Stopped 10/27/23 0005)  doxycycline (VIBRA-TABS) tablet 100 mg (100 mg Oral Given 10/26/23 2253)  hydrALAZINE (APRESOLINE) injection 10 mg (10 mg Intravenous Given 10/26/23 2336)    Mobility walks     Focused Assessments Renal Assessment Handoff:  Hemodialysis Schedule: Hemodialysis Schedule: Tuesday/Thursday/Saturday Last Hemodialysis date and time: 10/27/2023   Restricted appendage: left arm  , Pulmonary Assessment Handoff:  Lung sounds: Bilateral Breath Sounds: Coarse crackles O2 Device: Nasal Cannula O2 Flow Rate (L/min): 3 L/min  R Recommendations: See Admitting Provider Note  Report given to:   Additional Notes:

## 2023-10-28 NOTE — Progress Notes (Signed)
Patient had increased confusion throughout the shift. Patient pulled out IV. Pulled tele monitor and BiPAP off multiple time. Patient given 1x dose of IM Haldol. Patient has new IV. Tele placed back on. Tolerating BiPAP without any issues. Patient resting in bed. Continuous O2 monitor on.

## 2023-10-28 NOTE — Progress Notes (Signed)
PROGRESS NOTE    Derek Cooley.  VEL:[id number] DOB: 05-05-1966 DOA: 10/26/2023 PCP: Larae Grooms, NP    Assessment & Plan:   Principal Problem:   Acute on chronic respiratory failure with hypoxia and hypercapnia (HCC) Active Problems:   COPD with acute exacerbation (HCC)   ESRD on hemodialysis (HCC)   Essential hypertension   Dyslipidemia   Chronic diastolic CHF (congestive heart failure) (HCC)   GERD without esophagitis  Assessment and Plan: Acute on chronic hypoxic & hypercapnic respiratory failure: continue on BiPAP and wean as tolerated.    COPD exacerbation: continue on azithromycin, steroids, bronchodilators & encourage incentive spirometry. Continue on BiPAP and wean as tolerated.    Hyperkalemia: resolved    HTN: continue on amlodipine, coreg, losartan   ESRD: on HD TTS. Nephro following and recs apprec   ACD: likely secondary to ESRD. No need for a transfusion currently   Hyponatremia: will managed during HD    Chronic diastolic CHF: continue on coreg, losartan. Fluid management w/ HD.   HLD: continue on zetia    GERD: continue on PPI   Obesity: BMI 30.9. Would benefit from weight loss    DVT prophylaxis: heparin  Code Status: full  Family Communication:  Disposition Plan: likely d/c back home   Level of care: Progressive Status is: Inpatient Remains inpatient appropriate because: severity of illness    Consultants:  Nephro  Procedures:   Antimicrobials:    Subjective: Pt c/o shortness of breath still   Objective: Vitals:   10/28/23 0741 10/28/23 0742 10/28/23 0755 10/28/23 0757  BP: (!) 185/82     Pulse: 94     Resp: 19     Temp:  98.3 F (36.8 C)    TempSrc:  Oral    SpO2: 98%  98% 98%  Weight:      Height:  5\' 7"  (1.702 m)      Intake/Output Summary (Last 24 hours) at 10/28/2023 0848 Last data filed at 10/27/2023 1745 Gross per 24 hour  Intake --  Output 2400 ml  Net -2400 ml   Filed Weights   10/27/23 0100   Weight: 89.5 kg    Examination:  General exam: appears comfortable  Respiratory system: decreased breath sounds b/l  Cardiovascular system: S1/S2+. No rubs or clicks  Gastrointestinal system: abd is soft, NT, obese & hypoactive bowel sounds  Central nervous system: alert & awake. Moves all extremities  Psychiatry: Judgement and insight appears at baseline. Flat mood and affect     Data Reviewed: I have personally reviewed following labs and imaging studies  CBC: Recent Labs  Lab 10/26/23 2232 10/27/23 0644 10/28/23 0233  WBC 13.0* 14.1* 14.2*  NEUTROABS 10.6*  --   --   HGB 11.0* 10.7* 10.6*  HCT 34.4* 32.5* 31.7*  MCV 101.5* 97.3 97.2  PLT 220 215 219   Basic Metabolic Panel: Recent Labs  Lab 10/26/23 2232 10/27/23 0644 10/28/23 0233  NA 142 138 129*  K 4.1 6.0* 4.6  CL 105 104 93*  CO2 27 22 26   GLUCOSE 139* 193* 202*  BUN 52* 55* 45*  CREATININE 6.13* 6.53* 4.67*  CALCIUM 8.3* 8.3* 8.3*  MG 2.4  --   --    GFR: Estimated Creatinine Clearance: 18.6 mL/min (A) (by C-G formula based on SCr of 4.67 mg/dL (H)). Liver Function Tests: Recent Labs  Lab 10/26/23 2232  AST 15  ALT 11  ALKPHOS 98  BILITOT 0.5  PROT 7.7  ALBUMIN 3.9  No results for input(s): "LIPASE", "AMYLASE" in the last 168 hours. No results for input(s): "AMMONIA" in the last 168 hours. Coagulation Profile: No results for input(s): "INR", "PROTIME" in the last 168 hours. Cardiac Enzymes: No results for input(s): "CKTOTAL", "CKMB", "CKMBINDEX", "TROPONINI" in the last 168 hours. BNP (last 3 results) No results for input(s): "PROBNP" in the last 8760 hours. HbA1C: No results for input(s): "HGBA1C" in the last 72 hours. CBG: Recent Labs  Lab 10/27/23 0608  GLUCAP 180*   Lipid Profile: No results for input(s): "CHOL", "HDL", "LDLCALC", "TRIG", "CHOLHDL", "LDLDIRECT" in the last 72 hours. Thyroid Function Tests: No results for input(s): "TSH", "T4TOTAL", "FREET4", "T3FREE",  "THYROIDAB" in the last 72 hours. Anemia Panel: No results for input(s): "VITAMINB12", "FOLATE", "FERRITIN", "TIBC", "IRON", "RETICCTPCT" in the last 72 hours. Sepsis Labs: No results for input(s): "PROCALCITON", "LATICACIDVEN" in the last 168 hours.  No results found for this or any previous visit (from the past 240 hour(s)).       Radiology Studies: DG Chest Port 1 View  Result Date: 10/26/2023 CLINICAL DATA:  Cough and shortness of breath EXAM: PORTABLE CHEST 1 VIEW COMPARISON:  09/27/2023 FINDINGS: Cardiac shadow is within normal limits. The lungs are well aerated bilaterally. No focal infiltrate is seen. No bony abnormality is noted. IMPRESSION: No active disease. Electronically Signed   By: Alcide Clever M.D.   On: 10/26/2023 21:50        Scheduled Meds:  amLODipine  10 mg Oral Daily   aspirin EC  81 mg Oral Daily   carvedilol  6.25 mg Oral BID WC   Chlorhexidine Gluconate Cloth  6 each Topical Q0600   DULoxetine  60 mg Oral Daily   ezetimibe  10 mg Oral Daily   guaiFENesin  600 mg Oral BID   heparin injection (subcutaneous)  5,000 Units Subcutaneous Q8H   losartan  100 mg Oral Daily   nicotine  14 mg Transdermal Daily   predniSONE  40 mg Oral Q breakfast   topiramate  50 mg Oral BID   Continuous Infusions:  anticoagulant sodium citrate     azithromycin Stopped (10/27/23 1924)   cefTRIAXone (ROCEPHIN)  IV Stopped (10/28/23 0148)     LOS: 1 day     Charise Killian, MD [facility] Pager [phone]  If 7PM-7AM, please contact night-coverage [email or web address] 10/28/2023, 8:48 AM

## 2023-10-29 DIAGNOSIS — J441 Chronic obstructive pulmonary disease with (acute) exacerbation: Secondary | ICD-10-CM | POA: Diagnosis not present

## 2023-10-29 LAB — BASIC METABOLIC PANEL
Anion gap: 13 (ref 5–15)
BUN: 66 mg/dL — ABNORMAL HIGH (ref 6–20)
CO2: 25 mmol/L (ref 22–32)
Calcium: 8.1 mg/dL — ABNORMAL LOW (ref 8.9–10.3)
Chloride: 92 mmol/L — ABNORMAL LOW (ref 98–111)
Creatinine, Ser: 6.12 mg/dL — ABNORMAL HIGH (ref 0.61–1.24)
GFR, Estimated: 10 mL/min — ABNORMAL LOW (ref >60)
Glucose, Bld: 99 mg/dL (ref 70–99)
Potassium: 4.7 mmol/L (ref 3.5–5.1)
Sodium: 130 mmol/L — ABNORMAL LOW (ref 135–145)

## 2023-10-29 LAB — BLOOD GAS, VENOUS
Acid-Base Excess: 1.2 mmol/L (ref 0.0–2.0)
Bicarbonate: 29 mmol/L — ABNORMAL HIGH (ref 20.0–28.0)
O2 Saturation: 87.1 %
Patient temperature: 37
pCO2, Ven: 59 mm[Hg] (ref 44–60)
pH, Ven: 7.3 (ref 7.25–7.43)
pO2, Ven: 54 mm[Hg] — ABNORMAL HIGH (ref 32–45)

## 2023-10-29 LAB — CBC
HCT: 28.4 % — ABNORMAL LOW (ref 39.0–52.0)
Hemoglobin: 9.8 g/dL — ABNORMAL LOW (ref 13.0–17.0)
MCH: 32.2 pg (ref 26.0–34.0)
MCHC: 34.5 g/dL (ref 30.0–36.0)
MCV: 93.4 fL (ref 80.0–100.0)
Platelets: 231 10*3/uL (ref 150–400)
RBC: 3.04 MIL/uL — ABNORMAL LOW (ref 4.22–5.81)
RDW: 13.2 % (ref 11.5–15.5)
WBC: 11.6 10*3/uL — ABNORMAL HIGH (ref 4.0–10.5)
nRBC: 0 % (ref 0.0–0.2)

## 2023-10-29 LAB — HEPATITIS B SURFACE ANTIBODY, QUANTITATIVE: Hep B S AB Quant (Post): <3.5 m[IU]/mL — ABNORMAL LOW

## 2023-10-29 MED ORDER — AZITHROMYCIN 250 MG PO TABS
500.0000 mg | ORAL_TABLET | ORAL | Status: DC
  Administered 2023-10-29 – 2023-10-30 (×2): 500 mg via ORAL
  Filled 2023-10-29 (×2): qty 2

## 2023-10-29 NOTE — Progress Notes (Signed)
[facility], [place] 10/29/23  Subjective:   LOS: 2  Patient resting comfortably in bed. Currently denies shortness of breath. Blood pressure still a bit labile.   Objective:  Vital signs in last 24 hours:  Temp:  [97.6 F (36.4 C)-98.5 F (36.9 C)] 98 F (36.7 C) (11/19 1641) Pulse Rate:  [69-76] 76 (11/19 1641) Resp:  [13-20] 15 (11/19 1641) BP: (123-169)/(75-100) 169/83 (11/19 1641) SpO2:  [91 %-100 %] 99 % (11/19 1641) FiO2 (%):  [35 %] 35 % (11/19 0148)  Weight change:  Filed Weights   10/27/23 0100  Weight: 89.5 kg    Intake/Output:    Intake/Output Summary (Last 24 hours) at 10/29/2023 2030 Last data filed at 10/29/2023 1700 Gross per 24 hour  Intake --  Output 1300 ml  Net -1300 ml     Physical Exam: General: No acute distress  HEENT Anicteric  Pulm/lungs Scattered rhonchi, normal effort  CVS/Heart  S1S2 no rubs  Abdomen:  Soft, obese, nontender  Extremities: Trace edema  Neurologic: Alert, oriented  Skin: No acute rashes  Access: Left forearm AV fistula       Basic Metabolic Panel:  Recent Labs  Lab 10/26/23 2232 10/27/23 0644 10/28/23 0233 10/29/23 0449  NA 142 138 129* 130*  K 4.1 6.0* 4.6 4.7  CL 105 104 93* 92*  CO2 27 22 26 25   GLUCOSE 139* 193* 202* 99  BUN 52* 55* 45* 66*  CREATININE 6.13* 6.53* 4.67* 6.12*  CALCIUM 8.3* 8.3* 8.3* 8.1*  MG 2.4  --   --   --      CBC: Recent Labs  Lab 10/26/23 2232 10/27/23 0644 10/28/23 0233 10/29/23 0449  WBC 13.0* 14.1* 14.2* 11.6*  NEUTROABS 10.6*  --   --   --   HGB 11.0* 10.7* 10.6* 9.8*  HCT 34.4* 32.5* 31.7* 28.4*  MCV 101.5* 97.3 97.2 93.4  PLT 220 215 219 231      Lab Results  Component Value Date   HEPBSAG NON REACTIVE 10/27/2023   HEPBSAB NON REACTIVE 09/07/2022      Microbiology:  No results found for this or any previous visit (from the past 240 hour(s)).  Coagulation Studies: No results for input(s): "LABPROT", "INR" in the  last 72 hours.  Urinalysis: No results for input(s): "COLORURINE", "LABSPEC", "PHURINE", "GLUCOSEU", "HGBUR", "BILIRUBINUR", "KETONESUR", "PROTEINUR", "UROBILINOGEN", "NITRITE", "LEUKOCYTESUR" in the last 72 hours.  Invalid input(s): "APPERANCEUR"    Imaging: No results found.   Medications:    anticoagulant sodium citrate     cefTRIAXone (ROCEPHIN)  IV 1 g (10/29/23 0334)    amLODipine  10 mg Oral Daily   aspirin EC  81 mg Oral Daily   azithromycin  500 mg Oral Q24H   carvedilol  6.25 mg Oral BID WC   Chlorhexidine Gluconate Cloth  6 each Topical Q0600   DULoxetine  60 mg Oral Daily   ezetimibe  10 mg Oral Daily   guaiFENesin  600 mg Oral BID   heparin injection (subcutaneous)  5,000 Units Subcutaneous Q8H   losartan  100 mg Oral Daily   nicotine  14 mg Transdermal Daily   predniSONE  40 mg Oral Q breakfast   topiramate  50 mg Oral BID   acetaminophen **OR** acetaminophen, alteplase, anticoagulant sodium citrate, chlorpheniramine-HYDROcodone, feeding supplement (NEPRO CARB STEADY), heparin, heparin, lidocaine (PF), lidocaine-prilocaine, magnesium hydroxide, ondansetron **OR** ondansetron (ZOFRAN) IV, oxyCODONE, pantoprazole, pentafluoroprop-tetrafluoroeth, traZODone  Assessment/ Plan:  57 y.o. male with  was admitted  on 10/26/2023 for  Principal Problem:   Acute on chronic respiratory failure with hypoxia and hypercapnia (HCC) Active Problems:   ESRD on hemodialysis (HCC)   Essential hypertension   GERD without esophagitis   COPD with acute exacerbation (HCC)   Dyslipidemia   Chronic diastolic CHF (congestive heart failure) (HCC)  COPD exacerbation (HCC) [J44.1] ESRD on hemodialysis (HCC) [N18.6, Z99.2] Acute on chronic respiratory failure with hypoxia and hypercapnia (HCC) [O13.08, J96.22]  Outpatient dialysis-[facility]/TTS/Lt AVF  #. ESRD, hyperkalemia Patient much improved as compared to admission.  Given high dialysis inpatient volume we will  plan for hemodialysis treatment early tomorrow a.m.  #. Anemia of CKD  Lab Results  Component Value Date   HGB 9.8 (L) 10/29/2023   Hemoglobin close to target at 9.8.  Continue to monitor trend.  #. Secondary hyperparathyroidism of renal origin N 25.81      Component Value Date/Time   PTH 135 (H) 12/07/2021 1554   PTH Comment 12/07/2021 1554   Lab Results  Component Value Date   PHOS 5.6 (H) 10/03/2023   Phosphorus close to target at 5.6.  Monitor bone metabolism parameters.  #.  COPD exacerbation Patient on ceftriaxone and azithromycin.  LOS: 2 Derek Cooley 11/19/20248:30 PM  [facility] [address] [place], [place] [phone]

## 2023-10-29 NOTE — Plan of Care (Signed)
Problem: Education: Goal: Knowledge of General Education information will improve Description: Including pain rating scale, medication(s)/side effects and non-pharmacologic comfort measures Outcome: Progressing   Problem: Health Behavior/Discharge Planning: Goal: Ability to manage health-related needs will improve 10/29/2023 1852 by Sharol Given, RN Outcome: Progressing 10/29/2023 1850 by Sharol Given, RN Outcome: Not Progressing   Problem: Clinical Measurements: Goal: Ability to maintain clinical measurements within normal limits will improve 10/29/2023 1852 by Sharol Given, RN Outcome: Progressing 10/29/2023 1850 by Sharol Given, RN Outcome: Not Progressing Goal: Will remain free from infection 10/29/2023 1852 by Sharol Given, RN Outcome: Progressing 10/29/2023 1850 by Sharol Given, RN Outcome: Not Progressing Goal: Diagnostic test results will improve 10/29/2023 1852 by Sharol Given, RN Outcome: Progressing 10/29/2023 1850 by Sharol Given, RN Outcome: Not Progressing Goal: Respiratory complications will improve 10/29/2023 1852 by Sharol Given, RN Outcome: Progressing 10/29/2023 1850 by Sharol Given, RN Outcome: Not Progressing Goal: Cardiovascular complication will be avoided 10/29/2023 1852 by Sharol Given, RN Outcome: Progressing 10/29/2023 1850 by Sharol Given, RN Outcome: Not Progressing   Problem: Activity: Goal: Risk for activity intolerance will decrease 10/29/2023 1852 by Sharol Given, RN Outcome: Progressing 10/29/2023 1850 by Sharol Given, RN Outcome: Not Progressing   Problem: Nutrition: Goal: Adequate nutrition will be maintained 10/29/2023 1852 by Sharol Given, RN Outcome: Progressing 10/29/2023 1850 by Sharol Given, RN Outcome: Not Progressing   Problem: Coping: Goal: Level of anxiety will decrease 10/29/2023 1852 by Sharol Given, RN Outcome:  Progressing 10/29/2023 1850 by Sharol Given, RN Outcome: Not Progressing   Problem: Elimination: Goal: Will not experience complications related to bowel motility 10/29/2023 1852 by Sharol Given, RN Outcome: Progressing 10/29/2023 1850 by Sharol Given, RN Outcome: Not Progressing Goal: Will not experience complications related to urinary retention 10/29/2023 1852 by Sharol Given, RN Outcome: Progressing 10/29/2023 1850 by Sharol Given, RN Outcome: Not Progressing   Problem: Pain Management: Goal: General experience of comfort will improve 10/29/2023 1852 by Sharol Given, RN Outcome: Progressing 10/29/2023 1850 by Sharol Given, RN Outcome: Not Progressing   Problem: Safety: Goal: Ability to remain free from injury will improve 10/29/2023 1852 by Sharol Given, RN Outcome: Progressing 10/29/2023 1850 by Sharol Given, RN Outcome: Not Progressing   Problem: Skin Integrity: Goal: Risk for impaired skin integrity will decrease 10/29/2023 1852 by Sharol Given, RN Outcome: Progressing 10/29/2023 1850 by Sharol Given, RN Outcome: Not Progressing   Problem: Education: Goal: Knowledge of disease or condition will improve 10/29/2023 1852 by Sharol Given, RN Outcome: Progressing 10/29/2023 1850 by Sharol Given, RN Outcome: Not Progressing Goal: Knowledge of the prescribed therapeutic regimen will improve 10/29/2023 1852 by Sharol Given, RN Outcome: Progressing 10/29/2023 1850 by Sharol Given, RN Outcome: Not Progressing Goal: Individualized Educational Video(s) 10/29/2023 1852 by Sharol Given, RN Outcome: Progressing 10/29/2023 1850 by Sharol Given, RN Outcome: Not Progressing   Problem: Activity: Goal: Ability to tolerate increased activity will improve 10/29/2023 1852 by Sharol Given, RN Outcome: Progressing 10/29/2023 1850 by Sharol Given, RN Outcome: Not  Progressing Goal: Will verbalize the importance of balancing activity with adequate rest periods 10/29/2023 1852 by Sharol Given, RN Outcome: Progressing 10/29/2023 1850 by Sharol Given, RN Outcome: Not Progressing   Problem: Respiratory: Goal: Ability to maintain a clear airway will improve 10/29/2023 1852 by Sharol Given, RN Outcome:  Progressing 10/29/2023 1850 by Sharol Given, RN Outcome: Not Progressing Goal: Levels of oxygenation will improve 10/29/2023 1852 by Sharol Given, RN Outcome: Progressing 10/29/2023 1850 by Sharol Given, RN Outcome: Not Progressing Goal: Ability to maintain adequate ventilation will improve 10/29/2023 1852 by Sharol Given, RN Outcome: Progressing 10/29/2023 1850 by Sharol Given, RN Outcome: Not Progressing

## 2023-10-29 NOTE — Progress Notes (Signed)
PHARMACIST - PHYSICIAN COMMUNICATION DR:   Mayford Knife CONCERNING: Antibiotic IV to Oral Route Change Policy  RECOMMENDATION: This patient is receiving azithromycin by the intravenous route.  Based on criteria approved by the Pharmacy and Therapeutics Committee, the antibiotic(s) is/are being converted to the equivalent oral dose form(s).  DESCRIPTION: These criteria include: Patient being treated for a respiratory tract infection, urinary tract infection, cellulitis or clostridium difficile associated diarrhea if on metronidazole The patient is not neutropenic and does not exhibit a GI malabsorption state The patient is eating (either orally or via tube) and/or has been taking other orally administered medications for a least 24 hours The patient is improving clinically and has a Tmax < 100.5  If you have questions about this conversion, please contact the Pharmacy Department   Effie Shy, PharmD Pharmacy Resident  10/29/2023 12:56 PM

## 2023-10-29 NOTE — Plan of Care (Signed)
  Problem: Education: Goal: Knowledge of General Education information will improve Description: Including pain rating scale, medication(s)/side effects and non-pharmacologic comfort measures 10/29/2023 0506 by Lucky Cowboy, RN Outcome: Progressing 10/29/2023 0506 by Lucky Cowboy, RN Outcome: Progressing   Problem: Health Behavior/Discharge Planning: Goal: Ability to manage health-related needs will improve 10/29/2023 0506 by Lucky Cowboy, RN Outcome: Progressing 10/29/2023 0506 by Marinus Maw D, RN Outcome: Progressing   Problem: Clinical Measurements: Goal: Ability to maintain clinical measurements within normal limits will improve 10/29/2023 0506 by Lucky Cowboy, RN Outcome: Progressing 10/29/2023 0506 by Marinus Maw D, RN Outcome: Progressing Goal: Will remain free from infection 10/29/2023 0506 by Lucky Cowboy, RN Outcome: Progressing 10/29/2023 0506 by Marinus Maw D, RN Outcome: Progressing Goal: Diagnostic test results will improve 10/29/2023 0506 by Lucky Cowboy, RN Outcome: Progressing 10/29/2023 0506 by Marinus Maw D, RN Outcome: Progressing Goal: Respiratory complications will improve Outcome: Progressing Goal: Cardiovascular complication will be avoided Outcome: Progressing   Problem: Activity: Goal: Risk for activity intolerance will decrease Outcome: Progressing   Problem: Nutrition: Goal: Adequate nutrition will be maintained Outcome: Progressing   Problem: Coping: Goal: Level of anxiety will decrease Outcome: Progressing   Problem: Elimination: Goal: Will not experience complications related to bowel motility Outcome: Progressing Goal: Will not experience complications related to urinary retention Outcome: Progressing   Problem: Pain Management: Goal: General experience of comfort will improve Outcome: Progressing   Problem: Safety: Goal: Ability to remain free from injury will improve Outcome: Progressing    Problem: Skin Integrity: Goal: Risk for impaired skin integrity will decrease Outcome: Progressing   Problem: Education: Goal: Knowledge of disease or condition will improve Outcome: Progressing Goal: Knowledge of the prescribed therapeutic regimen will improve Outcome: Progressing Goal: Individualized Educational Video(s) Outcome: Progressing   Problem: Activity: Goal: Ability to tolerate increased activity will improve Outcome: Progressing Goal: Will verbalize the importance of balancing activity with adequate rest periods Outcome: Progressing   Problem: Respiratory: Goal: Ability to maintain a clear airway will improve Outcome: Progressing Goal: Levels of oxygenation will improve Outcome: Progressing Goal: Ability to maintain adequate ventilation will improve Outcome: Progressing

## 2023-10-29 NOTE — Progress Notes (Signed)
PROGRESS NOTE    Derek Cooley.  ZYS:[id number] DOB: 1966/06/17 DOA: 09/27/2023 PCP: Larae Grooms, NP    Brief Narrative:   57 year old male history significant for ESRD on HD, CHF, hypertension, hyperlipidemia, chronic hypoxic respiratory failure who presents to the ED with progressively worsening dyspnea associated with cough and wheeze   Patient has a long history of medication and treatment nonadherence.  Frequently misses dialysis.  Patient states he missed his last 3 dialysis sessions.  He is markedly hypertensive with systolics greater than 200.  Currently on nitroglycerin drip.  10/20: Blood pressure improved.  Weaned off nitroglycerin drip.  Stable for transfer to general medical service.   Assessment & Plan:   Principal Problem:   NSTEMI (non-ST elevated myocardial infarction) (HCC) Active Problems:   Acute on chronic diastolic heart failure (HCC)   End-stage renal disease on hemodialysis (HCC)   Essential hypertension   Hypertensive emergency   Type 2 diabetes mellitus with chronic kidney disease, without long-term current use of insulin (HCC)   Heart failure with preserved ejection fraction (HCC)   GERD without esophagitis   Anxiety and depression   Smoking   NSVT (nonsustained ventricular tachycardia) (HCC)   Hypertensive urgency  ESRD on HD Hypertensive urgency Patient initially required admission to stepdown unit and nitroglycerin gtt.  Received stat dialysis on 10/19.  Blood pressure control improved.  Restarted on home medications. Plan: Continue metoprolol, amlodipine, losartan IV hydralazine as needed Nephrology following for HD Next HD 10/23  Elevated troponin Suspect supply/demand ischemia in the setting of hypertensive urgency and ESRD Echo normal ejection fraction Plan: Heparin drip x 48 hours, completed No plans for ischemic evaluation EKG as needed chest pain  Acute heart failure with preserved ejection fraction In the setting of  multiple missed dialysis sessions Volume management with dialysis  NSVT Episode of NSVT lasting 31 beats Overall asymptomatic Plan: Continue telemetry Replete potassium as needed  Type 2 diabetes mellitus with CKD Last hemoglobin A1c 5.5 in July 2024 No indication for carb modified diet No indication for CBGs  Chronic hypoxic respiratory failure Patient on baseline 3 L oxygen As needed bronchodilators  History of drug use UDS     DVT prophylaxis: SQ heparin Code Status: Full Family Communication:None Disposition Plan: Status is: Inpatient Remains inpatient appropriate because: Multiple acute issues as above.  Anticipate discharge 10/22   Level of care: Telemetry Medical  Consultants:  Nephrology Cardiology  Procedures:  None  Antimicrobials: None   Subjective: Seen and examined.  No acute events overnight.  Objective: Vitals:   10/03/23 1200 10/03/23 1209 10/03/23 1216 10/03/23 1349  BP: 128/76 113/69  (!) 156/98  Pulse: 67 72  73  Resp: 17 19    Temp:  98.4 F (36.9 C)    TempSrc:  Oral    SpO2: 98% 99%  99%  Weight:   89.5 kg    No intake or output data in the 24 hours ending 10/29/23 1719  Filed Weights   10/01/23 1257 10/03/23 0747 10/03/23 1216  Weight: 90.4 kg 91.1 kg 89.5 kg    Examination:  General exam: NAD.  Chronically ill appearing Respiratory system: Bibasilar crackles.  Normal work of breathing.  3 L Cardiovascular system: S1-S2, RRR, no murmurs, no pedal edema Gastrointestinal system: Soft, NT/ND, normal bowel sounds Central nervous system: Alert and oriented. No focal neurological deficits. Extremities: Symmetric 5 x 5 power. Skin: No rashes, lesions or ulcers Psychiatry: Judgement and insight appear normal. Mood & affect appropriate.  Data Reviewed: I have personally reviewed following labs and imaging studies  CBC: Recent Labs  Lab 10/26/23 2232 10/27/23 0644 10/28/23 0233 10/29/23 0449  WBC 13.0* 14.1*  14.2* 11.6*  NEUTROABS 10.6*  --   --   --   HGB 11.0* 10.7* 10.6* 9.8*  HCT 34.4* 32.5* 31.7* 28.4*  MCV 101.5* 97.3 97.2 93.4  PLT 220 215 219 231   Basic Metabolic Panel: Recent Labs  Lab 10/26/23 2232 10/27/23 0644 10/28/23 0233 10/29/23 0449  NA 142 138 129* 130*  K 4.1 6.0* 4.6 4.7  CL 105 104 93* 92*  CO2 27 22 26 25   GLUCOSE 139* 193* 202* 99  BUN 52* 55* 45* 66*  CREATININE 6.13* 6.53* 4.67* 6.12*  CALCIUM 8.3* 8.3* 8.3* 8.1*  MG 2.4  --   --   --    GFR: Estimated Creatinine Clearance: 14.2 mL/min (A) (by C-G formula based on SCr of 6.12 mg/dL (H)). Liver Function Tests: Recent Labs  Lab 10/26/23 2232  AST 15  ALT 11  ALKPHOS 98  BILITOT 0.5  PROT 7.7  ALBUMIN 3.9   No results for input(s): "LIPASE", "AMYLASE" in the last 168 hours. No results for input(s): "AMMONIA" in the last 168 hours. Coagulation Profile: No results for input(s): "INR", "PROTIME" in the last 168 hours.  Cardiac Enzymes: No results for input(s): "CKTOTAL", "CKMB", "CKMBINDEX", "TROPONINI" in the last 168 hours. BNP (last 3 results) No results for input(s): "PROBNP" in the last 8760 hours. HbA1C: No results for input(s): "HGBA1C" in the last 72 hours. CBG: Recent Labs  Lab 10/27/23 0608  GLUCAP 180*   Lipid Profile: No results for input(s): "CHOL", "HDL", "LDLCALC", "TRIG", "CHOLHDL", "LDLDIRECT" in the last 72 hours.  Thyroid Function Tests: No results for input(s): "TSH", "T4TOTAL", "FREET4", "T3FREE", "THYROIDAB" in the last 72 hours. Anemia Panel: No results for input(s): "VITAMINB12", "FOLATE", "FERRITIN", "TIBC", "IRON", "RETICCTPCT" in the last 72 hours. Sepsis Labs: No results for input(s): "PROCALCITON", "LATICACIDVEN" in the last 168 hours.  No results found for this or any previous visit (from the past 240 hour(s)).        Radiology Studies: No results found.      Scheduled Meds:   Continuous Infusions:     LOS: 6 days        Tresa Moore, MD [facility]   If 7PM-7AM, please contact night-coverage  10/29/2023, 5:19 PM

## 2023-10-29 NOTE — Progress Notes (Signed)
PROGRESS NOTE   HPI was taken from Dr. Arville Care: Derek Cooley. is a 57 y.o. male with medical history significant for end-stage renal disease on hemodialysis, GERD, OSA, COPD, depression, panic disorder, type 2 diabetes mellitus and asthma, who presented to the emergency room with acute onset of worsening dyspnea with associated fatigue.  The patient missed hemodialysis on Tuesday and Saturday.  He admitted to associated productive cough for the last few days with wheezing.  No fever or chills.  No nausea or vomiting or abdominal pain.  No chest pain or palpitations.   ED Course: When the patient came to the ER, BP was 170/85 with heart rate of 117 with respiratory rate of 22 and possibly 87% on room air and 100% on 3 L O2 by nasal cannula.  Later on the patient had increased work of breathing and was placed on BiPAP.  VBG showed pH 7.24 with HCO3 of 30 and pCO2 of 70 with pO2 40.  CMP revealed a BUN of 52 and creatinine 6.13 with calcium 8.3 and otherwise unremarkable CMP.  CBC showed leukocytosis 13 with neutrophilia as well as anemia better than previous levels with microcytosis. EKG as reviewed by me : EKG showed sinus rhythm with rate of 87 with right atrial enlargement and anterior Q waves. Imaging: Portable chest x-ray showed no acute cardiopulmonary disease.   The patient was given 125 mg IV Solu-Medrol, 2 g of IV magnesium sulfate, 10 mg of IV hydralazine, nebulized albuterol 5 mg and DuoNeb as well as 100 mg p.o. doxycycline.  He will be admitted to a progressive unit bed for further evaluation and management.   Derek Nan Stahnke Jr.  WUJ:[id number] DOB: 1966/10/21 DOA: 10/26/2023 PCP: Derek Grooms, NP    Assessment & Plan:   Principal Problem:   Acute on chronic respiratory failure with hypoxia and hypercapnia (HCC) Active Problems:   COPD with acute exacerbation (HCC)   ESRD on hemodialysis (HCC)   Essential hypertension   Dyslipidemia   Chronic diastolic CHF (congestive  heart failure) (HCC)   GERD without esophagitis  Assessment and Plan: Acute on chronic hypoxic & hypercapnic respiratory failure: weaned off of BiPAP. Continue on supplemental oxygen    COPD exacerbation: continue on azithromycin, steroids, bronchodilators, encourage incentive spirometry. Weaned off of BiPAP.    Hyperkalemia: resolved    HTN: continue on losartan, coreg, amlodipine    ESRD: on HD TTS. Nephro following and recs apprec   ACD: likely secondary to ESRD. Will transfuse if Hb < 7.0   Hyponatremia:  will be managed w/ HD    Chronic diastolic CHF: continue on losartan, coreg. Fluid management w/ HD   HLD: continue on zetia    GERD: continue on PPI   Obesity: BMI 30.9. Would benefit from weight loss    DVT prophylaxis: heparin  Code Status: full  Family Communication:  Disposition Plan: likely d/c back home   Level of care: Progressive Status is: Inpatient Remains inpatient appropriate because: severity of illness    Consultants:  Nephro  Procedures:   Antimicrobials:    Subjective: Pt c/o shortness of breath   Objective: Vitals:   10/29/23 0319 10/29/23 0735 10/29/23 0930 10/29/23 0935  BP: (!) 155/79 (!) 154/100    Pulse: 75 69    Resp: 18 15  20   Temp: 97.6 F (36.4 C)     TempSrc: Oral     SpO2: 97%  91% 95%  Weight:      Height:  Intake/Output Summary (Last 24 hours) at 10/29/2023 1144 Last data filed at 10/29/2023 0543 Gross per 24 hour  Intake --  Output 300 ml  Net -300 ml   Filed Weights   10/27/23 0100  Weight: 89.5 kg    Examination:  General exam: appears uncomfortable Respiratory system: diminished breath sounds b/l  Cardiovascular system: S1/S2+. No rubs or gallops  Gastrointestinal system: abd is soft, NT, obese & normal bowel sounds  Central nervous system: alert & awake. Moves all extremities   Psychiatry: Judgement and insight appears not at baseline. Flat mood and affect     Data Reviewed: I have  personally reviewed following labs and imaging studies  CBC: Recent Labs  Lab 10/26/23 2232 10/27/23 0644 10/28/23 0233 10/29/23 0449  WBC 13.0* 14.1* 14.2* 11.6*  NEUTROABS 10.6*  --   --   --   HGB 11.0* 10.7* 10.6* 9.8*  HCT 34.4* 32.5* 31.7* 28.4*  MCV 101.5* 97.3 97.2 93.4  PLT 220 215 219 231   Basic Metabolic Panel: Recent Labs  Lab 10/26/23 2232 10/27/23 0644 10/28/23 0233 10/29/23 0449  NA 142 138 129* 130*  K 4.1 6.0* 4.6 4.7  CL 105 104 93* 92*  CO2 27 22 26 25   GLUCOSE 139* 193* 202* 99  BUN 52* 55* 45* 66*  CREATININE 6.13* 6.53* 4.67* 6.12*  CALCIUM 8.3* 8.3* 8.3* 8.1*  MG 2.4  --   --   --    GFR: Estimated Creatinine Clearance: 14.2 mL/min (A) (by C-G formula based on SCr of 6.12 mg/dL (H)). Liver Function Tests: Recent Labs  Lab 10/26/23 2232  AST 15  ALT 11  ALKPHOS 98  BILITOT 0.5  PROT 7.7  ALBUMIN 3.9   No results for input(s): "LIPASE", "AMYLASE" in the last 168 hours. No results for input(s): "AMMONIA" in the last 168 hours. Coagulation Profile: No results for input(s): "INR", "PROTIME" in the last 168 hours. Cardiac Enzymes: No results for input(s): "CKTOTAL", "CKMB", "CKMBINDEX", "TROPONINI" in the last 168 hours. BNP (last 3 results) No results for input(s): "PROBNP" in the last 8760 hours. HbA1C: No results for input(s): "HGBA1C" in the last 72 hours. CBG: Recent Labs  Lab 10/27/23 0608  GLUCAP 180*   Lipid Profile: No results for input(s): "CHOL", "HDL", "LDLCALC", "TRIG", "CHOLHDL", "LDLDIRECT" in the last 72 hours. Thyroid Function Tests: No results for input(s): "TSH", "T4TOTAL", "FREET4", "T3FREE", "THYROIDAB" in the last 72 hours. Anemia Panel: No results for input(s): "VITAMINB12", "FOLATE", "FERRITIN", "TIBC", "IRON", "RETICCTPCT" in the last 72 hours. Sepsis Labs: No results for input(s): "PROCALCITON", "LATICACIDVEN" in the last 168 hours.  No results found for this or any previous visit (from the past 240  hour(s)).       Radiology Studies: No results found.      Scheduled Meds:  amLODipine  10 mg Oral Daily   aspirin EC  81 mg Oral Daily   carvedilol  6.25 mg Oral BID WC   Chlorhexidine Gluconate Cloth  6 each Topical Q0600   DULoxetine  60 mg Oral Daily   ezetimibe  10 mg Oral Daily   guaiFENesin  600 mg Oral BID   heparin injection (subcutaneous)  5,000 Units Subcutaneous Q8H   losartan  100 mg Oral Daily   nicotine  14 mg Transdermal Daily   predniSONE  40 mg Oral Q breakfast   topiramate  50 mg Oral BID   Continuous Infusions:  anticoagulant sodium citrate     azithromycin 500 mg (10/28/23 1606)  cefTRIAXone (ROCEPHIN)  IV 1 g (10/29/23 0334)     LOS: 2 days     Charise Killian, MD [facility] Pager [phone]  If 7PM-7AM, please contact night-coverage [email or web address] 10/29/2023, 11:44 AM

## 2023-10-29 NOTE — Discharge Planning (Signed)
ESTABLISHED HEMODIALYSIS Outpatient Facility [facility]  [address] [place], [place] [address] [phone]  Schedule: TTS 10:45am  Confirmed patient is active and can resume schedule upon discharge.  Dimas Chyle Dialysis Coordinator II  [facility] Cell: [phone] eFax: [phone] [email or web address]

## 2023-10-29 NOTE — Care Management Important Message (Signed)
Important Message  Patient Details  Name: Derek Cooley. MRN: [id number] Date of Birth: 04-23-1966   Important Message Given:  Yes - Medicare IM     Bernadette Hoit 10/29/2023, 11:01 AM

## 2023-10-30 DIAGNOSIS — J9622 Acute and chronic respiratory failure with hypercapnia: Secondary | ICD-10-CM | POA: Diagnosis not present

## 2023-10-30 DIAGNOSIS — J9621 Acute and chronic respiratory failure with hypoxia: Secondary | ICD-10-CM | POA: Diagnosis not present

## 2023-10-30 LAB — CBC
HCT: 30.2 % — ABNORMAL LOW (ref 39.0–52.0)
Hemoglobin: 10.3 g/dL — ABNORMAL LOW (ref 13.0–17.0)
MCH: 31.7 pg (ref 26.0–34.0)
MCHC: 34.1 g/dL (ref 30.0–36.0)
MCV: 92.9 fL (ref 80.0–100.0)
Platelets: 219 10*3/uL (ref 150–400)
RBC: 3.25 MIL/uL — ABNORMAL LOW (ref 4.22–5.81)
RDW: 13 % (ref 11.5–15.5)
WBC: 8.3 10*3/uL (ref 4.0–10.5)
nRBC: 0 % (ref 0.0–0.2)

## 2023-10-30 LAB — BASIC METABOLIC PANEL
Anion gap: 10 (ref 5–15)
BUN: 85 mg/dL — ABNORMAL HIGH (ref 6–20)
CO2: 27 mmol/L (ref 22–32)
Calcium: 8.5 mg/dL — ABNORMAL LOW (ref 8.9–10.3)
Chloride: 103 mmol/L (ref 98–111)
Creatinine, Ser: 6.4 mg/dL — ABNORMAL HIGH (ref 0.61–1.24)
GFR, Estimated: 9 mL/min — ABNORMAL LOW (ref >60)
Glucose, Bld: 99 mg/dL (ref 70–99)
Potassium: 4.9 mmol/L (ref 3.5–5.1)
Sodium: 133 mmol/L — ABNORMAL LOW (ref 135–145)

## 2023-10-30 MED ORDER — ACETAMINOPHEN 650 MG RE SUPP: 650.0000 mg | Freq: Four times a day (QID) | RECTAL | Status: DC | PRN

## 2023-10-30 MED ORDER — ACETAMINOPHEN 325 MG PO TABS: 650.0000 mg | ORAL_TABLET | Freq: Four times a day (QID) | ORAL | Status: DC | PRN

## 2023-10-30 MED ORDER — OXYCODONE HCL 5 MG PO TABS
ORAL_TABLET | ORAL | Status: AC
  Filled 2023-10-30: qty 1

## 2023-10-30 MED ORDER — HYDRALAZINE HCL 20 MG/ML IJ SOLN
10.0000 mg | Freq: Four times a day (QID) | INTRAMUSCULAR | Status: DC | PRN
  Administered 2023-10-30: 10 mg via INTRAVENOUS
  Filled 2023-10-30: qty 1

## 2023-10-30 MED ORDER — PENTAFLUOROPROP-TETRAFLUOROETH EX AERO
INHALATION_SPRAY | CUTANEOUS | Status: AC
  Filled 2023-10-30: qty 30

## 2023-10-30 NOTE — Plan of Care (Signed)

## 2023-10-30 NOTE — Progress Notes (Signed)
[facility], [place] 10/30/23  Subjective:   LOS: 3  Patient seen and evaluated during hemodialysis treatment today. Tolerating well. UF target 2.5 kg.   Objective:  Vital signs in last 24 hours:  Temp:  [98 F (36.7 C)-98.5 F (36.9 C)] 98 F (36.7 C) (11/20 0432) Pulse Rate:  [12-78] 78 (11/20 0830) Resp:  [13-20] 16 (11/20 0830) BP: (150-179)/(77-99) 177/85 (11/20 0830) SpO2:  [95 %-100 %] 100 % (11/20 0830) FiO2 (%):  [35 %] 35 % (11/20 0200) Weight:  [90.6 kg] 90.6 kg (11/20 0756)  Weight change:  Filed Weights   10/27/23 0100 10/30/23 0756  Weight: 89.5 kg 90.6 kg    Intake/Output:    Intake/Output Summary (Last 24 hours) at 10/30/2023 0932 Last data filed at 10/30/2023 0543 Gross per 24 hour  Intake --  Output 3000 ml  Net -3000 ml     Physical Exam: General: No acute distress  HEENT Anicteric  Pulm/lungs Scattered rhonchi, normal effort  CVS/Heart S1S2 no rubs  Abdomen:  Soft, obese, nontender  Extremities: Trace edema  Neurologic: Alert, oriented  Skin: No acute rashes  Access: Left forearm AV fistula       Basic Metabolic Panel:  Recent Labs  Lab 10/26/23 2232 10/27/23 0644 10/28/23 0233 10/29/23 0449 10/30/23 0508  NA 142 138 129* 130* 133*  K 4.1 6.0* 4.6 4.7 4.9  CL 105 104 93* 92* 103  CO2 27 22 26 25 27   GLUCOSE 139* 193* 202* 99 99  BUN 52* 55* 45* 66* 85*  CREATININE 6.13* 6.53* 4.67* 6.12* 6.40*  CALCIUM 8.3* 8.3* 8.3* 8.1* 8.5*  MG 2.4  --   --   --   --      CBC: Recent Labs  Lab 10/26/23 2232 10/27/23 0644 10/28/23 0233 10/29/23 0449 10/30/23 0508  WBC 13.0* 14.1* 14.2* 11.6* 8.3  NEUTROABS 10.6*  --   --   --   --   HGB 11.0* 10.7* 10.6* 9.8* 10.3*  HCT 34.4* 32.5* 31.7* 28.4* 30.2*  MCV 101.5* 97.3 97.2 93.4 92.9  PLT 220 215 219 231 219      Lab Results  Component Value Date   HEPBSAG NON REACTIVE 10/27/2023   HEPBSAB NON REACTIVE 09/07/2022       Microbiology:  No results found for this or any previous visit (from the past 240 hour(s)).  Coagulation Studies: No results for input(s): "LABPROT", "INR" in the last 72 hours.  Urinalysis: No results for input(s): "COLORURINE", "LABSPEC", "PHURINE", "GLUCOSEU", "HGBUR", "BILIRUBINUR", "KETONESUR", "PROTEINUR", "UROBILINOGEN", "NITRITE", "LEUKOCYTESUR" in the last 72 hours.  Invalid input(s): "APPERANCEUR"    Imaging: No results found.   Medications:    anticoagulant sodium citrate     cefTRIAXone (ROCEPHIN)  IV 1 g (10/30/23 0122)    amLODipine  10 mg Oral Daily   aspirin EC  81 mg Oral Daily   azithromycin  500 mg Oral Q24H   carvedilol  6.25 mg Oral BID WC   Chlorhexidine Gluconate Cloth  6 each Topical Q0600   DULoxetine  60 mg Oral Daily   ezetimibe  10 mg Oral Daily   guaiFENesin  600 mg Oral BID   heparin injection (subcutaneous)  5,000 Units Subcutaneous Q8H   losartan  100 mg Oral Daily   nicotine  14 mg Transdermal Daily   predniSONE  40 mg Oral Q breakfast   topiramate  50 mg Oral BID   acetaminophen **OR** acetaminophen, alteplase, anticoagulant sodium citrate, chlorpheniramine-HYDROcodone,  feeding supplement (NEPRO CARB STEADY), heparin, heparin, lidocaine (PF), lidocaine-prilocaine, magnesium hydroxide, ondansetron **OR** ondansetron (ZOFRAN) IV, oxyCODONE, pantoprazole, pentafluoroprop-tetrafluoroeth, traZODone  Assessment/ Plan:  57 y.o. male with  was admitted on 10/26/2023 for  Principal Problem:   Acute on chronic respiratory failure with hypoxia and hypercapnia (HCC) Active Problems:   ESRD on hemodialysis (HCC)   Essential hypertension   GERD without esophagitis   COPD with acute exacerbation (HCC)   Dyslipidemia   Chronic diastolic CHF (congestive heart failure) (HCC)  COPD exacerbation (HCC) [J44.1] ESRD on hemodialysis (HCC) [N18.6, Z99.2] Acute on chronic respiratory failure with hypoxia and hypercapnia (HCC) [N82.95,  J96.22]  Outpatient dialysis-[facility]/TTS/Lt AVF  #. ESRD, hyperkalemia Patient seen and evaluated during hemodialysis treatment today.  Tolerating well.  Will plan to complete dialysis treatment today.  If still here we will resume his normal schedule tomorrow.  Potassium currently normal at 4.9.  #. Anemia of CKD  Lab Results  Component Value Date   HGB 10.3 (L) 10/30/2023   Hemoglobin improved to 10.3.  Continue to monitor CBC periodically.  #. Secondary hyperparathyroidism of renal origin N 25.81      Component Value Date/Time   PTH 135 (H) 12/07/2021 1554   PTH Comment 12/07/2021 1554   Lab Results  Component Value Date   PHOS 5.6 (H) 10/03/2023   Phosphorus close to target at 5.6.  Monitor bone metabolism parameters.  #.  COPD exacerbation Patient on ceftriaxone and azithromycin.  LOS: 3 Derek Cooley 11/20/20249:32 AM  [facility] [address] [place], [place] [phone]

## 2023-10-30 NOTE — Progress Notes (Signed)
  PROGRESS NOTE    Derek Cooley.  ZOX:[id number] DOB: 12-18-1965 DOA: 10/26/2023 PCP: Larae Grooms, NP  240A/240A-AA  LOS: 3 days   Brief hospital course:   Assessment & Plan: Derek Cooley. is a 57 y.o. male with medical history significant for end-stage renal disease on hemodialysis, GERD, OSA, COPD, depression, panic disorder, type 2 diabetes mellitus and asthma, who presented to the emergency room with acute onset of worsening dyspnea with associated fatigue.  The patient missed hemodialysis on Tuesday and Saturday.   Acute on chronic hypoxic & hypercapnic respiratory failure:  3L O2 baseline weaned off of BiPAP.  Continue on supplemental oxygen    COPD exacerbation:  --started on ceftriaxone and azithromycin --d/c abx today --cont prednisone  Hyperkalemia: --correct with dialysis   HTN:  --BP uncontrolled continue on losartan, coreg, amlodipine  --IV hydralazine PRN   ESRD: on HD TTS.  --iHD per nephro  ACD: likely secondary to ESRD.  Will transfuse if Hb < 7.0    Hyponatremia:   --possible hypervolemia   Chronic diastolic CHF:  continue on losartan, coreg. F luid management w/ HD    HLD: continue on zetia    GERD: continue on PPI    Obesity: BMI 30.9. Would benefit from weight loss     DVT prophylaxis: Heparin SQ Code Status: Full code  Family Communication:  Level of care: Progressive Dispo:   The patient is from: home Anticipated d/c is to: home Anticipated d/c date is: tomorrow   Subjective and Interval History:  No new complaint.   Objective: Vitals:   10/30/23 1241 10/30/23 1304 10/30/23 1536 10/30/23 1730  BP: (!) 170/84 (!) 198/93 (!) 156/72 (!) 157/89  Pulse:  70  81  Resp:  20 20 (!) 25  Temp:    98.1 F (36.7 C)  TempSrc:      SpO2: 96% 100%  96%  Weight: 88.7 kg     Height:        Intake/Output Summary (Last 24 hours) at 10/30/2023 1843 Last data filed at 10/30/2023 1745 Gross per 24 hour  Intake --   Output 4700 ml  Net -4700 ml   Filed Weights   10/27/23 0100 10/30/23 0756 10/30/23 1241  Weight: 89.5 kg 90.6 kg 88.7 kg    Examination:   Constitutional: NAD, AAOx3 HEENT: conjunctivae and lids normal, EOMI CV: No cyanosis.   RESP: normal respiratory effort, gurgling breath sounds Neuro: II - XII grossly intact.   Psych: Normal mood and affect.  Appropriate judgement and reason   Data Reviewed: I have personally reviewed labs and imaging studies  Time spent: 50 minutes  Darlin Priestly, MD [facility] If 7PM-7AM, please contact night-coverage 10/30/2023, 6:43 PM

## 2023-10-30 NOTE — Progress Notes (Signed)
Hemodialysis note  Received patient in bed to unit. Alert and oriented.  Informed consent signed and in chart.  Treatment initiated: 0812 Treatment completed: 1241  Patient tolerated well. Transported back to room, alert without acute distress.  Report given to patient's RN.   Access used: Left Arm AVF Access issues: none  Total UF removed: 2.5L Medication(s) given:  Oxycodone 5 mg tab PO  Post HD weight: 88.7 kg   [facility]

## 2023-10-31 DIAGNOSIS — J9622 Acute and chronic respiratory failure with hypercapnia: Secondary | ICD-10-CM | POA: Diagnosis not present

## 2023-10-31 DIAGNOSIS — J9621 Acute and chronic respiratory failure with hypoxia: Secondary | ICD-10-CM | POA: Diagnosis not present

## 2023-10-31 LAB — RENAL FUNCTION PANEL
Albumin: 3.3 g/dL — ABNORMAL LOW (ref 3.5–5.0)
Anion gap: 10 (ref 5–15)
BUN: 61 mg/dL — ABNORMAL HIGH (ref 6–20)
CO2: 28 mmol/L (ref 22–32)
Calcium: 8.3 mg/dL — ABNORMAL LOW (ref 8.9–10.3)
Chloride: 95 mmol/L — ABNORMAL LOW (ref 98–111)
Creatinine, Ser: 5.25 mg/dL — ABNORMAL HIGH (ref 0.61–1.24)
GFR, Estimated: 12 mL/min — ABNORMAL LOW (ref >60)
Glucose, Bld: 231 mg/dL — ABNORMAL HIGH (ref 70–99)
Phosphorus: 4.6 mg/dL (ref 2.5–4.6)
Potassium: 3.9 mmol/L (ref 3.5–5.1)
Sodium: 133 mmol/L — ABNORMAL LOW (ref 135–145)

## 2023-10-31 LAB — CBC
HCT: 31.6 % — ABNORMAL LOW (ref 39.0–52.0)
Hemoglobin: 11.1 g/dL — ABNORMAL LOW (ref 13.0–17.0)
MCH: 32.1 pg (ref 26.0–34.0)
MCHC: 35.1 g/dL (ref 30.0–36.0)
MCV: 91.3 fL (ref 80.0–100.0)
Platelets: 253 10*3/uL (ref 150–400)
RBC: 3.46 MIL/uL — ABNORMAL LOW (ref 4.22–5.81)
RDW: 13.2 % (ref 11.5–15.5)
WBC: 7.5 10*3/uL (ref 4.0–10.5)
nRBC: 0 % (ref 0.0–0.2)

## 2023-10-31 MED ORDER — AMLODIPINE BESYLATE 10 MG PO TABS: 10.0000 mg | ORAL_TABLET | Freq: Every day | ORAL | 2 refills | Status: DC

## 2023-10-31 MED ORDER — OXYCODONE HCL 5 MG PO TABS
ORAL_TABLET | ORAL | Status: AC
  Filled 2023-10-31: qty 1

## 2023-10-31 MED ORDER — CARVEDILOL 6.25 MG PO TABS: 6.2500 mg | ORAL_TABLET | Freq: Two times a day (BID) | ORAL | 2 refills | Status: DC

## 2023-10-31 MED ORDER — ORAL CARE MOUTH RINSE: 15.0000 mL | OROMUCOSAL | Status: DC | PRN

## 2023-10-31 MED ORDER — LOSARTAN POTASSIUM 100 MG PO TABS: 100.0000 mg | ORAL_TABLET | Freq: Every day | ORAL | 2 refills | Status: DC

## 2023-10-31 NOTE — Progress Notes (Signed)
Hemodialysis note  Received patient in bed to unit. Alert and oriented.  Informed consent signed and in chart.  Treatment initiated: 0957 Treatment completed: 1350  Patient tolerated well. Transported back to room, alert without acute distress.  Report given to patient's RN.   Access used: Left Arm AVG Access issues: High venous pressure  Total UF removed: 1.5L Medication(s) given:  Oxycodone 5 mg tab PO  Post HD weight: 86.8 kg   Wolfgang Phoenix [facility]

## 2023-10-31 NOTE — Progress Notes (Signed)
[facility], [place] 10/31/23  Subjective:   LOS: 4  Patient underwent hemodialysis treatment yesterday. He is due for another dialysis session to get him back on his regular schedule today.    Objective:  Vital signs in last 24 hours:  Temp:  [97.8 F (36.6 C)-98.5 F (36.9 C)] 98.5 F (36.9 C) (11/21 0945) Pulse Rate:  [65-94] 72 (11/21 1100) Resp:  [15-25] 18 (11/21 1100) BP: (107-198)/(66-126) 154/78 (11/21 1100) SpO2:  [92 %-100 %] 96 % (11/21 1100) Weight:  [87.6 kg-88.7 kg] 87.6 kg (11/21 0945)  Weight change:  Filed Weights   10/30/23 0756 10/30/23 1241 10/31/23 0945  Weight: 90.6 kg 88.7 kg 87.6 kg    Intake/Output:    Intake/Output Summary (Last 24 hours) at 10/31/2023 1118 Last data filed at 10/30/2023 1905 Gross per 24 hour  Intake 300 ml  Output 2700 ml  Net -2400 ml     Physical Exam: General: No acute distress  HEENT Anicteric  Pulm/lungs Scattered rhonchi, normal effort  CVS/Heart S1S2 no rubs  Abdomen:  Soft, obese, nontender  Extremities: Trace edema  Neurologic: Alert, oriented  Skin: No acute rashes  Access: Left forearm AV fistula       Basic Metabolic Panel:  Recent Labs  Lab 10/26/23 2232 10/27/23 0644 10/28/23 0233 10/29/23 0449 10/30/23 0508 10/31/23 1020  NA 142 138 129* 130* 133* 133*  K 4.1 6.0* 4.6 4.7 4.9 3.9  CL 105 104 93* 92* 103 95*  CO2 27 22 26 25 27 28   GLUCOSE 139* 193* 202* 99 99 231*  BUN 52* 55* 45* 66* 85* 61*  CREATININE 6.13* 6.53* 4.67* 6.12* 6.40* 5.25*  CALCIUM 8.3* 8.3* 8.3* 8.1* 8.5* 8.3*  MG 2.4  --   --   --   --   --   PHOS  --   --   --   --   --  4.6     CBC: Recent Labs  Lab 10/26/23 2232 10/27/23 0644 10/28/23 0233 10/29/23 0449 10/30/23 0508 10/31/23 1020  WBC 13.0* 14.1* 14.2* 11.6* 8.3 7.5  NEUTROABS 10.6*  --   --   --   --   --   HGB 11.0* 10.7* 10.6* 9.8* 10.3* 11.1*  HCT 34.4* 32.5* 31.7* 28.4* 30.2* 31.6*  MCV 101.5* 97.3 97.2 93.4 92.9  91.3  PLT 220 215 219 231 219 253      Lab Results  Component Value Date   HEPBSAG NON REACTIVE 10/27/2023   HEPBSAB NON REACTIVE 09/07/2022      Microbiology:  No results found for this or any previous visit (from the past 240 hour(s)).  Coagulation Studies: No results for input(s): "LABPROT", "INR" in the last 72 hours.  Urinalysis: No results for input(s): "COLORURINE", "LABSPEC", "PHURINE", "GLUCOSEU", "HGBUR", "BILIRUBINUR", "KETONESUR", "PROTEINUR", "UROBILINOGEN", "NITRITE", "LEUKOCYTESUR" in the last 72 hours.  Invalid input(s): "APPERANCEUR"    Imaging: No results found.   Medications:      amLODipine  10 mg Oral Daily   aspirin EC  81 mg Oral Daily   carvedilol  6.25 mg Oral BID WC   Chlorhexidine Gluconate Cloth  6 each Topical Q0600   DULoxetine  60 mg Oral Daily   ezetimibe  10 mg Oral Daily   guaiFENesin  600 mg Oral BID   heparin injection (subcutaneous)  5,000 Units Subcutaneous Q8H   losartan  100 mg Oral Daily   nicotine  14 mg Transdermal Daily   topiramate  50  mg Oral BID   acetaminophen **OR** acetaminophen, chlorpheniramine-HYDROcodone, hydrALAZINE, magnesium hydroxide, ondansetron **OR** ondansetron (ZOFRAN) IV, mouth rinse, oxyCODONE, pantoprazole, traZODone  Assessment/ Plan:  57 y.o. male with  was admitted on 10/26/2023 for  Principal Problem:   Acute on chronic respiratory failure with hypoxia and hypercapnia (HCC) Active Problems:   ESRD on hemodialysis (HCC)   Essential hypertension   GERD without esophagitis   COPD with acute exacerbation (HCC)   Dyslipidemia   Chronic diastolic CHF (congestive heart failure) (HCC)  COPD exacerbation (HCC) [J44.1] ESRD on hemodialysis (HCC) [N18.6, Z99.2] Acute on chronic respiratory failure with hypoxia and hypercapnia (HCC) [O13.08, J96.22]  Outpatient dialysis-[facility]/TTS/Lt AVF  #. ESRD, hyperkalemia Patient due for hemodialysis treatment today.  He underwent  dialysis treatment yesterday as well.  We are performing dialysis treatment today to get him back on his usual schedule.  #. Anemia of CKD  Lab Results  Component Value Date   HGB 11.1 (L) 10/31/2023   Hemoglobin continues to improve and currently up to 11.1.  #. Secondary hyperparathyroidism of renal origin N 25.81      Component Value Date/Time   PTH 135 (H) 12/07/2021 1554   PTH Comment 12/07/2021 1554   Lab Results  Component Value Date   PHOS 4.6 10/31/2023   Phosphorus now down to 4.6 in the target.  #.  COPD exacerbation Patient treated with ceftriaxone and azithromycin and improved significantly.   LOS: 4 Yulanda Diggs 11/21/202411:18 AM  [address] [place], [place] [phone]

## 2023-10-31 NOTE — Discharge Summary (Signed)
Physician Discharge Summary   Derek Cooley.  male DOB: 01/21/1966  FAO:[id number]  PCP: Larae Grooms, NP  Admit date: 10/26/2023 Discharge date: 10/31/2023  Admitted From: home Disposition:  home CODE STATUS: Full code  Discharge Instructions     Diet - low sodium heart healthy   Complete by: As directed       Hospital Course:  For full details, please see H&P, progress notes, consult notes and ancillary notes.  Briefly,  Derek Cooley. is a 57 y.o. male with medical history significant for end-stage renal disease on hemodialysis, OSA, COPD, panic disorder, type 2 diabetes mellitus, who presented to the emergency room with acute onset of worsening dyspnea with associated fatigue.  The patient missed hemodialysis on Tuesday and Saturday.    Acute on chronic hypoxic & hypercapnic respiratory failure:  3L O2 baseline weaned off of BiPAP.  Respiratory status improved with inpatient dialysis.  Pt was back on home 3L O2 prior to discharge.   COPD exacerbation:  --started on ceftriaxone and azithromycin on presentation which were not continued, since pt did not have PNA. --pt was started on IV solumedrol f/b prednisone.  Completed 5 days of steroid burst. --pt was not taking Trelegy PTA, which is resumed after discharge.  ESRD: on HD TTS.  --Pt missed outpatient dialysis sessions.  Received iHD with improvement in respiratory status.   Hyperkalemia: --corrected with dialysis   HTN:  --BP uncontrolled --Not taking amlodipine and losartan PTA, both resumed continue on coreg  ACD:  likely secondary to ESRD.    Hyponatremia:     Chronic diastolic CHF:  continue on losartan, coreg.  fluid management w/ HD    HLD:  continue on zetia    GERD:  continue on PPI    Obesity: BMI 30.9.  Would benefit from weight loss    Unless noted above, medications under "STOP" list are ones pt was not taking PTA.  Discharge Diagnoses:  Principal Problem:   Acute  on chronic respiratory failure with hypoxia and hypercapnia (HCC) Active Problems:   COPD with acute exacerbation (HCC)   ESRD on hemodialysis (HCC)   Essential hypertension   Dyslipidemia   Chronic diastolic CHF (congestive heart failure) (HCC)   GERD without esophagitis   30 Day Unplanned Readmission Risk Score    Flowsheet Row ED to Hosp-Admission (Current) from 10/26/2023 in [facility]  30 Day Unplanned Readmission Risk Score (%) 49.53 Filed at 10/31/2023 0801       This score is the patient's risk of an unplanned readmission within 30 days of being discharged (0 -100%). The score is based on dignosis, age, lab data, medications, orders, and past utilization.   Low:  0-14.9   Medium: 15-21.9   High: 22-29.9   Extreme: 30 and above         Discharge Instructions:  Allergies as of 10/31/2023       Reactions   Codeine Hives, Other (See Comments)   Atorvastatin    Joint Aches - Severe Joint Aches - Severe Joint Aches - Severe   Avelox [moxifloxacin Hcl In Nacl]    Muscle pain   Buprenorphine    Mouth sores, confusion, shaking   Dilaudid [hydromorphone Hcl] Hives   Fluoxetine Itching   Levofloxacin Other (See Comments)   Joint Pain   Morphine    Other    Muscle pain   Suboxone [buprenorphine Hcl-naloxone Hcl] Other (See Comments)   Rash and confused  Vancomycin    Renal insufficiency        Medication List     STOP taking these medications    oxyCODONE 5 MG immediate release tablet Commonly known as: Oxy IR/ROXICODONE   topiramate 50 MG tablet Commonly known as: TOPAMAX       TAKE these medications    albuterol 108 (90 Base) MCG/ACT inhaler Commonly known as: VENTOLIN HFA Inhale 1-2 puffs into the lungs every 6 (six) hours as needed for wheezing or shortness of breath.   amLODipine 10 MG tablet Commonly known as: NORVASC Take 1 tablet (10 mg total) by mouth daily.   aspirin EC 81 MG tablet Take 1 tablet (81 mg  total) by mouth daily. Swallow whole.   carvedilol 6.25 MG tablet Commonly known as: COREG Take 1 tablet (6.25 mg total) by mouth 2 (two) times daily with a meal.   DULoxetine 60 MG capsule Commonly known as: CYMBALTA Take 1 capsule (60 mg total) by mouth daily.   ezetimibe 10 MG tablet Commonly known as: ZETIA Take 1 tablet (10 mg total) by mouth daily.   lidocaine-prilocaine cream Commonly known as: EMLA Apply 1 Application topically daily.   losartan 100 MG tablet Commonly known as: COZAAR Take 1 tablet (100 mg total) by mouth daily.   OXYGEN Inhale 3 L into the lungs daily.   pantoprazole 40 MG tablet Commonly known as: PROTONIX TAKE 1 TABLET(40 MG) BY MOUTH TWICE DAILY AS NEEDED   Trelegy Ellipta 100-62.5-25 MCG/ACT Aepb Generic drug: Fluticasone-Umeclidin-Vilant Inhale 1 puff into the lungs daily.         Follow-up Information     Larae Grooms, NP Follow up in 1 week(s).   Specialty: Nurse Practitioner Contact information: [address] [place] [address] [phone]                 Allergies  Allergen Reactions   Codeine Hives and Other (See Comments)   Atorvastatin     Joint Aches - Severe Joint Aches - Severe Joint Aches - Severe   Avelox [Moxifloxacin Hcl In Nacl]     Muscle pain   Buprenorphine     Mouth sores, confusion, shaking   Dilaudid [Hydromorphone Hcl] Hives   Fluoxetine Itching   Levofloxacin Other (See Comments)    Joint Pain   Morphine    Other     Muscle pain   Suboxone [Buprenorphine Hcl-Naloxone Hcl] Other (See Comments)    Rash and confused   Vancomycin     Renal insufficiency     The results of significant diagnostics from this hospitalization (including imaging, microbiology, ancillary and laboratory) are listed below for reference.   Consultations:   Procedures/Studies: DG Chest Port 1 View  Result Date: 10/26/2023 CLINICAL DATA:  Cough and shortness of breath EXAM: PORTABLE CHEST 1 VIEW  COMPARISON:  09/27/2023 FINDINGS: Cardiac shadow is within normal limits. The lungs are well aerated bilaterally. No focal infiltrate is seen. No bony abnormality is noted. IMPRESSION: No active disease. Electronically Signed   By: Alcide Clever M.D.   On: 10/26/2023 21:50      Labs: BNP (last 3 results) Recent Labs    06/11/23 1530 09/16/23 0530 09/27/23 2034  BNP 270.1* 633.4* 573.4*   Basic Metabolic Panel: Recent Labs  Lab 10/26/23 2232 10/27/23 0644 10/28/23 0233 10/29/23 0449 10/30/23 0508  NA 142 138 129* 130* 133*  K 4.1 6.0* 4.6 4.7 4.9  CL 105 104 93* 92* 103  CO2 27 22  26 25 27   GLUCOSE 139* 193* 202* 99 99  BUN 52* 55* 45* 66* 85*  CREATININE 6.13* 6.53* 4.67* 6.12* 6.40*  CALCIUM 8.3* 8.3* 8.3* 8.1* 8.5*  MG 2.4  --   --   --   --    Liver Function Tests: Recent Labs  Lab 10/26/23 2232  AST 15  ALT 11  ALKPHOS 98  BILITOT 0.5  PROT 7.7  ALBUMIN 3.9   No results for input(s): "LIPASE", "AMYLASE" in the last 168 hours. No results for input(s): "AMMONIA" in the last 168 hours. CBC: Recent Labs  Lab 10/26/23 2232 10/27/23 0644 10/28/23 0233 10/29/23 0449 10/30/23 0508  WBC 13.0* 14.1* 14.2* 11.6* 8.3  NEUTROABS 10.6*  --   --   --   --   HGB 11.0* 10.7* 10.6* 9.8* 10.3*  HCT 34.4* 32.5* 31.7* 28.4* 30.2*  MCV 101.5* 97.3 97.2 93.4 92.9  PLT 220 215 219 231 219   Cardiac Enzymes: No results for input(s): "CKTOTAL", "CKMB", "CKMBINDEX", "TROPONINI" in the last 168 hours. BNP: Invalid input(s): "POCBNP" CBG: Recent Labs  Lab 10/27/23 0608  GLUCAP 180*   D-Dimer No results for input(s): "DDIMER" in the last 72 hours. Hgb A1c No results for input(s): "HGBA1C" in the last 72 hours. Lipid Profile No results for input(s): "CHOL", "HDL", "LDLCALC", "TRIG", "CHOLHDL", "LDLDIRECT" in the last 72 hours. Thyroid function studies No results for input(s): "TSH", "T4TOTAL", "T3FREE", "THYROIDAB" in the last 72 hours.  Invalid input(s):  "FREET3" Anemia work up No results for input(s): "VITAMINB12", "FOLATE", "FERRITIN", "TIBC", "IRON", "RETICCTPCT" in the last 72 hours. Urinalysis    Component Value Date/Time   COLORURINE YELLOW (A) 06/11/2023 1305   APPEARANCEUR HAZY (A) 06/11/2023 1305   APPEARANCEUR Clear 04/17/2021 1544   LABSPEC 1.013 06/11/2023 1305   LABSPEC 1.029 06/26/2012 1231   PHURINE 5.0 06/11/2023 1305   GLUCOSEU NEGATIVE 06/11/2023 1305   GLUCOSEU Negative 06/26/2012 1231   HGBUR NEGATIVE 06/11/2023 1305   BILIRUBINUR NEGATIVE 06/11/2023 1305   BILIRUBINUR Negative 04/17/2021 1544   BILIRUBINUR Negative 06/26/2012 1231   KETONESUR NEGATIVE 06/11/2023 1305   PROTEINUR >=300 (A) 06/11/2023 1305   NITRITE NEGATIVE 06/11/2023 1305   LEUKOCYTESUR NEGATIVE 06/11/2023 1305   LEUKOCYTESUR Negative 06/26/2012 1231   Sepsis Labs Recent Labs  Lab 10/27/23 0644 10/28/23 0233 10/29/23 0449 10/30/23 0508  WBC 14.1* 14.2* 11.6* 8.3   Microbiology No results found for this or any previous visit (from the past 240 hour(s)).   Total time spend on discharging this patient, including the last patient exam, discussing the hospital stay, instructions for ongoing care as it relates to all pertinent caregivers, as well as preparing the medical discharge records, prescriptions, and/or referrals as applicable, is 35 minutes.    Darlin Priestly, MD  [facility] 10/31/2023, 8:42 AM

## 2023-10-31 NOTE — TOC Progression Note (Addendum)
Transition of Care (TOC) - Progression Note    Patient Details  Name: Derek Cooley. MRN: [id number] Date of Birth: 1966-02-08  Transition of Care [facility]) CM/SW Contact  Truddie Hidden, RN Phone Number: 10/31/2023, 1:21 PM  Clinical Narrative:    Unable to complete RA. Patient off the unit. Attempt to contact patient's sister, [name]. No answer. Left a message.          Expected Discharge Plan and Services         Expected Discharge Date: 10/31/23                                     Social Determinants of Health (SDOH) Interventions SDOH Screenings   Food Insecurity: Food Insecurity Present (10/28/2023)  Housing: Low Risk  (10/28/2023)  Transportation Needs: No Transportation Needs (10/28/2023)  Utilities: Not At Risk (10/28/2023)  Alcohol Screen: Low Risk  (06/25/2023)  Depression (PHQ2-9): Medium Risk (06/25/2023)  Financial Resource Strain: Low Risk  (06/25/2023)  Physical Activity: Inactive (06/25/2023)  Social Connections: Socially Isolated (06/25/2023)  Stress: No Stress Concern Present (06/25/2023)  Tobacco Use: High Risk (10/28/2023)  Health Literacy: Inadequate Health Literacy (06/25/2023)    Readmission Risk Interventions    06/25/2022    3:34 PM  Readmission Risk Prevention Plan  Transportation Screening Complete  Medication Review (RN Care Manager) Complete  PCP or Specialist appointment within 3-5 days of discharge Complete  SW Recovery Care/Counseling Consult Complete  Palliative Care Screening Not Applicable  Skilled Nursing Facility Not Applicable

## 2023-11-05 ENCOUNTER — Ambulatory Visit: Payer: Medicare Other | Admitting: Nurse Practitioner

## 2023-11-05 ENCOUNTER — Encounter: Payer: Self-pay | Admitting: Nurse Practitioner

## 2023-11-05 VITALS — BP 185/86 | HR 93 | Temp 98.0°F | Ht 67.0 in | Wt 211.0 lb

## 2023-11-05 DIAGNOSIS — M25512 Pain in left shoulder: Secondary | ICD-10-CM

## 2023-11-05 DIAGNOSIS — D631 Anemia in chronic kidney disease: Secondary | ICD-10-CM

## 2023-11-05 DIAGNOSIS — Z09 Encounter for follow-up examination after completed treatment for conditions other than malignant neoplasm: Secondary | ICD-10-CM | POA: Diagnosis not present

## 2023-11-05 DIAGNOSIS — J439 Emphysema, unspecified: Secondary | ICD-10-CM

## 2023-11-05 DIAGNOSIS — G8929 Other chronic pain: Secondary | ICD-10-CM

## 2023-11-05 DIAGNOSIS — I5032 Chronic diastolic (congestive) heart failure: Secondary | ICD-10-CM

## 2023-11-05 DIAGNOSIS — N186 End stage renal disease: Secondary | ICD-10-CM

## 2023-11-05 DIAGNOSIS — I152 Hypertension secondary to endocrine disorders: Secondary | ICD-10-CM

## 2023-11-05 DIAGNOSIS — E1122 Type 2 diabetes mellitus with diabetic chronic kidney disease: Secondary | ICD-10-CM

## 2023-11-05 DIAGNOSIS — J441 Chronic obstructive pulmonary disease with (acute) exacerbation: Secondary | ICD-10-CM

## 2023-11-05 DIAGNOSIS — E1159 Type 2 diabetes mellitus with other circulatory complications: Secondary | ICD-10-CM

## 2023-11-05 DIAGNOSIS — Z992 Dependence on renal dialysis: Secondary | ICD-10-CM

## 2023-11-05 MED ORDER — ALBUTEROL SULFATE HFA 108 (90 BASE) MCG/ACT IN AERS: 1.0000 | INHALATION_SPRAY | Freq: Four times a day (QID) | RESPIRATORY_TRACT | 1 refills | Status: DC | PRN

## 2023-11-05 NOTE — Assessment & Plan Note (Signed)
Xray ordered of left shoulder. Will make recommendations based on imaging results.

## 2023-11-05 NOTE — Progress Notes (Signed)
BP (!) 185/86 (BP Location: Left Arm, Patient Position: Sitting, Cuff Size: Large)   Pulse 93   Temp 98 F (36.7 C) (Oral)   Ht 5\' 7"  (1.702 m)   Wt 211 lb (95.7 kg)   SpO2 93%   BMI 33.05 kg/m    Subjective:    Patient ID: Derek Jasmine., male    DOB: January 25, 1966, 57 y.o.   MRN: [id number]  HPI: Derek Hundley. is a 57 y.o. male  Chief Complaint  Patient presents with   Hospitalization Follow-up   COPD exacerbation   Shortness of Breath   Transition of Care Hospital Follow up.   Hospital/Facility: [facility] D/C Physician: Dr. Fran Lowes D/C Date: 10/31/23  Records Requested: NA Records Received: Discharge summary not complete at the time of the visit Records Reviewed: yes  Diagnoses on Discharge:   Acute on chronic respiratory failure with hypoxia and hypercapnia (HCC) - Has not seen Cardiology recently.     COPD with acute exacerbation (HCC) - Per patient was discharged on 3L. He is using Oxygen at home but doesn't have portable oxygen.  - He is using the Albuterol that he picked up in October but used it all and needs a refill - He is still smoking but down to 5-6 cigarettes per day.    Essential hypertension - We will continue antihypertensive therapy.   ESRD on hemodialysis [facility]) - he hasn't missed any days of dialysis since his most recent hospitalization.   Chronic diastolic CHF (congestive heart failure) (HCC) - We will continue Coreg and Cozaar.   Dyslipidemia - We will continue Zetia   GERD without esophagitis - Continue PPI therapy.   Patient complains of Left shoulder pain.  Started several years ago.  Has not seen anyone for it recently.  Has gotten worse over the last 6-8 months.  Has decreased ROM.  He fell a couple of months ago and caught himself with his arm which is when he hurt his arm.     Date of interactive Contact within 48 hours of discharge:  Contact was through: phone  Date of 7 day or 14 day face-to-face visit:    within 7  days  Outpatient Encounter Medications as of 11/05/2023  Medication Sig   amLODipine (NORVASC) 10 MG tablet Take 1 tablet (10 mg total) by mouth daily.   aspirin EC 81 MG tablet Take 1 tablet (81 mg total) by mouth daily. Swallow whole.   carvedilol (COREG) 6.25 MG tablet Take 1 tablet (6.25 mg total) by mouth 2 (two) times daily with a meal.   DULoxetine (CYMBALTA) 60 MG capsule Take 1 capsule (60 mg total) by mouth daily.   lidocaine-prilocaine (EMLA) cream Apply 1 Application topically daily.   losartan (COZAAR) 100 MG tablet Take 1 tablet (100 mg total) by mouth daily.   OXYGEN Inhale 3 L into the lungs daily.   pantoprazole (PROTONIX) 40 MG tablet TAKE 1 TABLET(40 MG) BY MOUTH TWICE DAILY AS NEEDED   TRELEGY ELLIPTA 100-62.5-25 MCG/ACT AEPB Inhale 1 puff into the lungs daily.   [DISCONTINUED] albuterol (VENTOLIN HFA) 108 (90 Base) MCG/ACT inhaler Inhale 1-2 puffs into the lungs every 6 (six) hours as needed for wheezing or shortness of breath.   albuterol (VENTOLIN HFA) 108 (90 Base) MCG/ACT inhaler Inhale 1-2 puffs into the lungs every 6 (six) hours as needed for wheezing or shortness of breath.   ezetimibe (ZETIA) 10 MG tablet Take 1 tablet (10 mg total) by mouth daily.  No facility-administered encounter medications on file as of 11/05/2023.    Diagnostic Tests Reviewed/Disposition: Reviewed  Consults: Cardiology and Nephrology  Discharge Instructions: Unable to review due to discharge summary not being complete at time of visit  Disease/illness Education: Provided during visit  Home Health/Community Services Discussions/Referrals:   Establishment or re-establishment of referral orders for community resources: Discussed during visit  Discussion with other health care providers: None  Assessment and Support of treatment regimen adherence: Discussed during visit. Patient has history of non compliance.  Appointments Coordinated with: Patient and Roommate  Education for  self-management, independent living, and ADLs: Patient needs assistance with daily living  Relevant past medical, surgical, family and social history reviewed and updated as indicated. Interim medical history since our last visit reviewed. Allergies and medications reviewed and updated.  Review of Systems  Eyes:  Negative for visual disturbance.  Respiratory:  Positive for shortness of breath.   Cardiovascular:  Negative for chest pain and leg swelling.  Musculoskeletal:        Left shoulder pain  Neurological:  Negative for light-headedness and headaches.    Per HPI unless specifically indicated above     Objective:    BP (!) 185/86 (BP Location: Left Arm, Patient Position: Sitting, Cuff Size: Large)   Pulse 93   Temp 98 F (36.7 C) (Oral)   Ht 5\' 7"  (1.702 m)   Wt 211 lb (95.7 kg)   SpO2 93%   BMI 33.05 kg/m   Wt Readings from Last 3 Encounters:  11/05/23 211 lb (95.7 kg)  10/31/23 191 lb 5.8 oz (86.8 kg)  10/03/23 197 lb 5 oz (89.5 kg)    Physical Exam Vitals and nursing note reviewed.  Constitutional:      General: He is not in acute distress.    Appearance: Normal appearance. He is not ill-appearing, toxic-appearing or diaphoretic.  HENT:     Head: Normocephalic.     Right Ear: External ear normal.     Left Ear: External ear normal.     Nose: Nose normal. No congestion or rhinorrhea.     Mouth/Throat:     Mouth: Mucous membranes are moist.  Eyes:     General:        Right eye: No discharge.        Left eye: No discharge.     Extraocular Movements: Extraocular movements intact.     Conjunctiva/sclera: Conjunctivae normal.     Pupils: Pupils are equal, round, and reactive to light.  Cardiovascular:     Rate and Rhythm: Normal rate and regular rhythm.     Heart sounds: No murmur heard. Pulmonary:     Effort: Pulmonary effort is normal. No respiratory distress.     Breath sounds: Wheezing present. No rhonchi or rales.  Abdominal:     General: Abdomen is  flat. Bowel sounds are normal.  Musculoskeletal:     Cervical back: Normal range of motion and neck supple.     Right lower leg: Edema present.     Left lower leg: Edema present.  Skin:    General: Skin is warm and dry.     Capillary Refill: Capillary refill takes less than 2 seconds.  Neurological:     General: No focal deficit present.     Mental Status: He is alert and oriented to person, place, and time.  Psychiatric:        Mood and Affect: Mood normal.        Behavior: Behavior normal.  Thought Content: Thought content normal.        Judgment: Judgment normal.     Results for orders placed or performed during the hospital encounter of 10/26/23  CBC with Differential  Result Value Ref Range   WBC 13.0 (H) 4.0 - 10.5 K/uL   RBC 3.39 (L) 4.22 - 5.81 MIL/uL   Hemoglobin 11.0 (L) 13.0 - 17.0 g/dL   HCT 16.1 (L) 09.6 - 04.5 %   MCV 101.5 (H) 80.0 - 100.0 fL   MCH 32.4 26.0 - 34.0 pg   MCHC 32.0 30.0 - 36.0 g/dL   RDW 40.9 81.1 - 91.4 %   Platelets 220 150 - 400 K/uL   nRBC 0.0 0.0 - 0.2 %   Neutrophils Relative % 82 %   Neutro Abs 10.6 (H) 1.7 - 7.7 K/uL   Lymphocytes Relative 6 %   Lymphs Abs 0.8 0.7 - 4.0 K/uL   Monocytes Relative 10 %   Monocytes Absolute 1.3 (H) 0.1 - 1.0 K/uL   Eosinophils Relative 1 %   Eosinophils Absolute 0.1 0.0 - 0.5 K/uL   Basophils Relative 0 %   Basophils Absolute 0.1 0.0 - 0.1 K/uL   Immature Granulocytes 1 %   Abs Immature Granulocytes 0.17 (H) 0.00 - 0.07 K/uL  Comprehensive metabolic panel  Result Value Ref Range   Sodium 142 135 - 145 mmol/L   Potassium 4.1 3.5 - 5.1 mmol/L   Chloride 105 98 - 111 mmol/L   CO2 27 22 - 32 mmol/L   Glucose, Bld 139 (H) 70 - 99 mg/dL   BUN 52 (H) 6 - 20 mg/dL   Creatinine, Ser 7.82 (H) 0.61 - 1.24 mg/dL   Calcium 8.3 (L) 8.9 - 10.3 mg/dL   Total Protein 7.7 6.5 - 8.1 g/dL   Albumin 3.9 3.5 - 5.0 g/dL   AST 15 15 - 41 U/L   ALT 11 0 - 44 U/L   Alkaline Phosphatase 98 38 - 126 U/L   Total  Bilirubin 0.5 <1.2 mg/dL   GFR, Estimated 10 (L) >60 mL/min   Anion gap 10 5 - 15  Magnesium  Result Value Ref Range   Magnesium 2.4 1.7 - 2.4 mg/dL  Blood gas, arterial  Result Value Ref Range   Delivery systems BILEVEL POSITIVE AIRWAY PRESSURE    pH, Arterial 7.24 (L) 7.35 - 7.45   pCO2 arterial 70 (HH) 32 - 48 mmHg   pO2, Arterial 40 (LL) 83 - 108 mmHg   Bicarbonate 30.0 (H) 20.0 - 28.0 mmol/L   Acid-Base Excess 0.7 0.0 - 2.0 mmol/L   O2 Saturation 71.9 %   Patient temperature 37.0    Collection site RIGHT RADIAL    Allens test (pass/fail) PASS PASS  Basic metabolic panel  Result Value Ref Range   Sodium 138 135 - 145 mmol/L   Potassium 6.0 (H) 3.5 - 5.1 mmol/L   Chloride 104 98 - 111 mmol/L   CO2 22 22 - 32 mmol/L   Glucose, Bld 193 (H) 70 - 99 mg/dL   BUN 55 (H) 6 - 20 mg/dL   Creatinine, Ser 9.56 (H) 0.61 - 1.24 mg/dL   Calcium 8.3 (L) 8.9 - 10.3 mg/dL   GFR, Estimated 9 (L) >60 mL/min   Anion gap 12 5 - 15  CBC  Result Value Ref Range   WBC 14.1 (H) 4.0 - 10.5 K/uL   RBC 3.34 (L) 4.22 - 5.81 MIL/uL   Hemoglobin 10.7 (L) 13.0 - 17.0  g/dL   HCT 16.1 (L) 09.6 - 04.5 %   MCV 97.3 80.0 - 100.0 fL   MCH 32.0 26.0 - 34.0 pg   MCHC 32.9 30.0 - 36.0 g/dL   RDW 40.9 81.1 - 91.4 %   Platelets 215 150 - 400 K/uL   nRBC 0.2 0.0 - 0.2 %  Blood gas, arterial  Result Value Ref Range   FIO2 50 %   Delivery systems BILEVEL POSITIVE AIRWAY PRESSURE    Inspiratory PAP 12 cmH2O   Expiratory PAP 5 cmH2O   pH, Arterial 7.26 (L) 7.35 - 7.45   pCO2 arterial 61 (H) 32 - 48 mmHg   pO2, Arterial 77 (L) 83 - 108 mmHg   Bicarbonate 27.4 20.0 - 28.0 mmol/L   Acid-base deficit 1.0 0.0 - 2.0 mmol/L   O2 Saturation 97.4 %   Patient temperature 37.0    Collection site RIGHT RADIAL    Allens test (pass/fail) PASS PASS  Hepatitis B surface antigen  Result Value Ref Range   Hepatitis B Surface Ag NON REACTIVE NON REACTIVE  Hepatitis B surface antibody,quantitative  Result Value Ref  Range   Hep B S AB Quant (Post) <3.5 (L) Immunity>10 mIU/mL  CBC  Result Value Ref Range   WBC 14.2 (H) 4.0 - 10.5 K/uL   RBC 3.26 (L) 4.22 - 5.81 MIL/uL   Hemoglobin 10.6 (L) 13.0 - 17.0 g/dL   HCT 78.2 (L) 95.6 - 21.3 %   MCV 97.2 80.0 - 100.0 fL   MCH 32.5 26.0 - 34.0 pg   MCHC 33.4 30.0 - 36.0 g/dL   RDW 08.6 57.8 - 46.9 %   Platelets 219 150 - 400 K/uL   nRBC 0.0 0.0 - 0.2 %  Basic metabolic panel  Result Value Ref Range   Sodium 129 (L) 135 - 145 mmol/L   Potassium 4.6 3.5 - 5.1 mmol/L   Chloride 93 (L) 98 - 111 mmol/L   CO2 26 22 - 32 mmol/L   Glucose, Bld 202 (H) 70 - 99 mg/dL   BUN 45 (H) 6 - 20 mg/dL   Creatinine, Ser 6.29 (H) 0.61 - 1.24 mg/dL   Calcium 8.3 (L) 8.9 - 10.3 mg/dL   GFR, Estimated 14 (L) >60 mL/min   Anion gap 10 5 - 15  Blood gas, arterial  Result Value Ref Range   FIO2 35 %   Delivery systems BILEVEL POSITIVE AIRWAY PRESSURE    Inspiratory PAP 12 cmH2O   Expiratory PAP 5 cmH2O   pH, Arterial 7.29 (L) 7.35 - 7.45   pCO2 arterial 64 (H) 32 - 48 mmHg   pO2, Arterial 94 83 - 108 mmHg   Bicarbonate 30.8 (H) 20.0 - 28.0 mmol/L   Acid-Base Excess 2.4 (H) 0.0 - 2.0 mmol/L   O2 Saturation 98.5 %   Patient temperature 37.0    Collection site REVIEWED BY    Allens test (pass/fail) PASS PASS  CBC  Result Value Ref Range   WBC 11.6 (H) 4.0 - 10.5 K/uL   RBC 3.04 (L) 4.22 - 5.81 MIL/uL   Hemoglobin 9.8 (L) 13.0 - 17.0 g/dL   HCT 52.8 (L) 41.3 - 24.4 %   MCV 93.4 80.0 - 100.0 fL   MCH 32.2 26.0 - 34.0 pg   MCHC 34.5 30.0 - 36.0 g/dL   RDW 01.0 27.2 - 53.6 %   Platelets 231 150 - 400 K/uL   nRBC 0.0 0.0 - 0.2 %  Basic metabolic panel  Result Value Ref Range   Sodium 130 (L) 135 - 145 mmol/L   Potassium 4.7 3.5 - 5.1 mmol/L   Chloride 92 (L) 98 - 111 mmol/L   CO2 25 22 - 32 mmol/L   Glucose, Bld 99 70 - 99 mg/dL   BUN 66 (H) 6 - 20 mg/dL   Creatinine, Ser 1.61 (H) 0.61 - 1.24 mg/dL   Calcium 8.1 (L) 8.9 - 10.3 mg/dL   GFR, Estimated 10 (L) >60  mL/min   Anion gap 13 5 - 15  Blood gas, venous  Result Value Ref Range   pH, Ven 7.3 7.25 - 7.43   pCO2, Ven 59 44 - 60 mmHg   pO2, Ven 54 (H) 32 - 45 mmHg   Bicarbonate 29.0 (H) 20.0 - 28.0 mmol/L   Acid-Base Excess 1.2 0.0 - 2.0 mmol/L   O2 Saturation 87.1 %   Patient temperature 37.0    Collection site VEIN   CBC  Result Value Ref Range   WBC 8.3 4.0 - 10.5 K/uL   RBC 3.25 (L) 4.22 - 5.81 MIL/uL   Hemoglobin 10.3 (L) 13.0 - 17.0 g/dL   HCT 09.6 (L) 04.5 - 40.9 %   MCV 92.9 80.0 - 100.0 fL   MCH 31.7 26.0 - 34.0 pg   MCHC 34.1 30.0 - 36.0 g/dL   RDW 81.1 91.4 - 78.2 %   Platelets 219 150 - 400 K/uL   nRBC 0.0 0.0 - 0.2 %  Basic metabolic panel  Result Value Ref Range   Sodium 133 (L) 135 - 145 mmol/L   Potassium 4.9 3.5 - 5.1 mmol/L   Chloride 103 98 - 111 mmol/L   CO2 27 22 - 32 mmol/L   Glucose, Bld 99 70 - 99 mg/dL   BUN 85 (H) 6 - 20 mg/dL   Creatinine, Ser 9.56 (H) 0.61 - 1.24 mg/dL   Calcium 8.5 (L) 8.9 - 10.3 mg/dL   GFR, Estimated 9 (L) >60 mL/min   Anion gap 10 5 - 15  Renal function panel  Result Value Ref Range   Sodium 133 (L) 135 - 145 mmol/L   Potassium 3.9 3.5 - 5.1 mmol/L   Chloride 95 (L) 98 - 111 mmol/L   CO2 28 22 - 32 mmol/L   Glucose, Bld 231 (H) 70 - 99 mg/dL   BUN 61 (H) 6 - 20 mg/dL   Creatinine, Ser 2.13 (H) 0.61 - 1.24 mg/dL   Calcium 8.3 (L) 8.9 - 10.3 mg/dL   Phosphorus 4.6 2.5 - 4.6 mg/dL   Albumin 3.3 (L) 3.5 - 5.0 g/dL   GFR, Estimated 12 (L) >60 mL/min   Anion gap 10 5 - 15  CBC  Result Value Ref Range   WBC 7.5 4.0 - 10.5 K/uL   RBC 3.46 (L) 4.22 - 5.81 MIL/uL   Hemoglobin 11.1 (L) 13.0 - 17.0 g/dL   HCT 08.6 (L) 57.8 - 46.9 %   MCV 91.3 80.0 - 100.0 fL   MCH 32.1 26.0 - 34.0 pg   MCHC 35.1 30.0 - 36.0 g/dL   RDW 62.9 52.8 - 41.3 %   Platelets 253 150 - 400 K/uL   nRBC 0.0 0.0 - 0.2 %  CBG monitoring, ED  Result Value Ref Range   Glucose-Capillary 180 (H) 70 - 99 mg/dL  Troponin I (High Sensitivity)  Result Value  Ref Range   Troponin I (High Sensitivity) 42 (H) <18 ng/L  Troponin I (High Sensitivity)  Result Value  Ref Range   Troponin I (High Sensitivity) 44 (H) <18 ng/L      Assessment & Plan:   Problem List Items Addressed This Visit       Cardiovascular and Mediastinum   Hypertension associated with diabetes (HCC)    Chronic. Not well controlled. Blood pressure is managed by Nephrologist.  He endorses taking Losartan and Carvedilol.  Does not need refills today.        Heart failure with preserved ejection fraction (HCC)    Chronic. Euvolemic in office today.  Encouraged patient to continue with dialysis as scheduled. New referral placed for heart failure clinic.  Patient needs to reestablish with specialists.       Relevant Orders   AMB referral to CHF clinic     Respiratory   Chronic obstructive pulmonary disease (COPD) (HCC)    Chronic. Not well controlled. Continues to smoke cigarettes but endorses being down to 5-6 per day. Has not seen Pulmonology. Not using Oxygen in office today.  New referral placed for Pulmonology.  Refill of Albuterol sent. Continue with Trellegy.      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  Will make recommendations based on lab results.        ESRD (end stage renal disease) on dialysis (HCC)    Chronic. Non compliant at times with dialysis.  Encouraged patient to adhere to dialysis regimen and discussed the importance of not missing days.          Other   Chronic left shoulder pain    Xray ordered of left shoulder. Will make recommendations based on imaging results.      Relevant Orders   DG Shoulder Left   Other Visit Diagnoses     Hospital discharge follow-up    -   Primary   Relevant Orders   Comp Met (CMET)   HgB A1c   CBC w/Diff        Follow up plan: No follow-ups on file.

## 2023-11-05 NOTE — Assessment & Plan Note (Signed)
Chronic. Not well controlled. Continues to smoke cigarettes but endorses being down to 5-6 per day. Has not seen Pulmonology. Not using Oxygen in office today.  New referral placed for Pulmonology.  Refill of Albuterol sent. Continue with Trellegy.

## 2023-11-05 NOTE — Assessment & Plan Note (Signed)
Chronic. Non compliant at times with dialysis.  Encouraged patient to adhere to dialysis regimen and discussed the importance of not missing days.

## 2023-11-05 NOTE — Assessment & Plan Note (Signed)
Chronic. Not well controlled. Blood pressure is managed by Nephrologist.  He endorses taking Losartan and Carvedilol.  Does not need refills today.

## 2023-11-05 NOTE — Assessment & Plan Note (Signed)
Labs ordered at visit today.  Will make recommendations based on lab results.   

## 2023-11-05 NOTE — Assessment & Plan Note (Signed)
Chronic. Euvolemic in office today.  Encouraged patient to continue with dialysis as scheduled. New referral placed for heart failure clinic.  Patient needs to reestablish with specialists.

## 2023-11-06 LAB — CBC WITH DIFFERENTIAL/PLATELET
Basophils Absolute: 0 10*3/uL (ref 0.0–0.2)
Basos: 0 %
EOS (ABSOLUTE): 0.2 10*3/uL (ref 0.0–0.4)
Eos: 2 %
Hematocrit: 29.3 % — ABNORMAL LOW (ref 37.5–51.0)
Hemoglobin: 9.6 g/dL — ABNORMAL LOW (ref 13.0–17.7)
Immature Grans (Abs): 0.2 10*3/uL — ABNORMAL HIGH (ref 0.0–0.1)
Immature Granulocytes: 2 %
Lymphocytes Absolute: 1 10*3/uL (ref 0.7–3.1)
Lymphs: 10 %
MCH: 31.5 pg (ref 26.6–33.0)
MCHC: 32.8 g/dL (ref 31.5–35.7)
MCV: 96 fL (ref 79–97)
Monocytes Absolute: 0.9 10*3/uL (ref 0.1–0.9)
Monocytes: 9 %
Neutrophils Absolute: 7.5 10*3/uL — ABNORMAL HIGH (ref 1.4–7.0)
Neutrophils: 77 %
Platelets: 253 10*3/uL (ref 150–450)
RBC: 3.05 x10E6/uL — ABNORMAL LOW (ref 4.14–5.80)
RDW: 13.2 % (ref 11.6–15.4)
WBC: 9.8 10*3/uL (ref 3.4–10.8)

## 2023-11-06 LAB — COMPREHENSIVE METABOLIC PANEL
ALT: 10 [IU]/L (ref 0–44)
AST: 15 [IU]/L (ref 0–40)
Albumin: 3.8 g/dL (ref 3.8–4.9)
Alkaline Phosphatase: 80 [IU]/L (ref 44–121)
BUN/Creatinine Ratio: 8 — ABNORMAL LOW (ref 9–20)
BUN: 41 mg/dL — ABNORMAL HIGH (ref 6–24)
Bilirubin Total: 0.4 mg/dL (ref 0.0–1.2)
CO2: 26 mmol/L (ref 20–29)
Calcium: 8.1 mg/dL — ABNORMAL LOW (ref 8.7–10.2)
Chloride: 103 mmol/L (ref 96–106)
Creatinine, Ser: 4.95 mg/dL — ABNORMAL HIGH (ref 0.76–1.27)
Globulin, Total: 2 g/dL (ref 1.5–4.5)
Glucose: 86 mg/dL (ref 70–99)
Potassium: 4.1 mmol/L (ref 3.5–5.2)
Sodium: 147 mmol/L — ABNORMAL HIGH (ref 134–144)
Total Protein: 5.8 g/dL — ABNORMAL LOW (ref 6.0–8.5)
eGFR: 13 mL/min/{1.73_m2} — ABNORMAL LOW (ref >59)

## 2023-11-06 LAB — HEMOGLOBIN A1C
Est. average glucose Bld gHb Est-mCnc: 123 mg/dL
Hgb A1c MFr Bld: 5.9 % — ABNORMAL HIGH (ref 4.8–5.6)

## 2023-11-12 ENCOUNTER — Telehealth: Payer: Self-pay | Admitting: Cardiology

## 2023-11-12 NOTE — Telephone Encounter (Signed)
Pt confirmed appt for 11/13/23

## 2023-11-13 ENCOUNTER — Encounter: Payer: Medicare Other | Admitting: Cardiology

## 2023-11-25 ENCOUNTER — Encounter: Payer: Medicare Other | Admitting: Cardiology

## 2023-11-30 ENCOUNTER — Emergency Department: Payer: Medicare Other

## 2023-11-30 ENCOUNTER — Encounter: Payer: Self-pay | Admitting: Pharmacy Technician

## 2023-11-30 ENCOUNTER — Observation Stay: Payer: Medicare Other

## 2023-11-30 ENCOUNTER — Observation Stay
Admission: EM | Admit: 2023-11-30 | Discharge: 2023-12-01 | Disposition: A | Discharge status: Home / Self Care | Payer: Medicare Other | Attending: Internal Medicine | Admitting: Internal Medicine

## 2023-11-30 ENCOUNTER — Other Ambulatory Visit: Payer: Self-pay

## 2023-11-30 DIAGNOSIS — G928 Other toxic encephalopathy: Secondary | ICD-10-CM | POA: Diagnosis not present

## 2023-11-30 DIAGNOSIS — F418 Other specified anxiety disorders: Secondary | ICD-10-CM | POA: Diagnosis present

## 2023-11-30 DIAGNOSIS — Z7984 Long term (current) use of oral hypoglycemic drugs: Secondary | ICD-10-CM | POA: Insufficient documentation

## 2023-11-30 DIAGNOSIS — Z8659 Personal history of other mental and behavioral disorders: Secondary | ICD-10-CM | POA: Insufficient documentation

## 2023-11-30 DIAGNOSIS — Z1152 Encounter for screening for COVID-19: Secondary | ICD-10-CM | POA: Diagnosis not present

## 2023-11-30 DIAGNOSIS — F419 Anxiety disorder, unspecified: Secondary | ICD-10-CM | POA: Diagnosis not present

## 2023-11-30 DIAGNOSIS — F17210 Nicotine dependence, cigarettes, uncomplicated: Secondary | ICD-10-CM | POA: Diagnosis not present

## 2023-11-30 DIAGNOSIS — F1090 Alcohol use, unspecified, uncomplicated: Secondary | ICD-10-CM | POA: Diagnosis not present

## 2023-11-30 DIAGNOSIS — J439 Emphysema, unspecified: Secondary | ICD-10-CM | POA: Diagnosis not present

## 2023-11-30 DIAGNOSIS — I132 Hypertensive heart and chronic kidney disease with heart failure and with stage 5 chronic kidney disease, or end stage renal disease: Secondary | ICD-10-CM | POA: Insufficient documentation

## 2023-11-30 DIAGNOSIS — T405X1A Poisoning by cocaine, accidental (unintentional), initial encounter: Principal | ICD-10-CM | POA: Insufficient documentation

## 2023-11-30 DIAGNOSIS — R4182 Altered mental status, unspecified: Principal | ICD-10-CM | POA: Insufficient documentation

## 2023-11-30 DIAGNOSIS — F1990 Other psychoactive substance use, unspecified, uncomplicated: Secondary | ICD-10-CM | POA: Diagnosis not present

## 2023-11-30 DIAGNOSIS — G4733 Obstructive sleep apnea (adult) (pediatric): Secondary | ICD-10-CM | POA: Diagnosis not present

## 2023-11-30 DIAGNOSIS — J449 Chronic obstructive pulmonary disease, unspecified: Secondary | ICD-10-CM | POA: Insufficient documentation

## 2023-11-30 DIAGNOSIS — I5020 Unspecified systolic (congestive) heart failure: Secondary | ICD-10-CM | POA: Diagnosis not present

## 2023-11-30 DIAGNOSIS — E785 Hyperlipidemia, unspecified: Secondary | ICD-10-CM | POA: Diagnosis not present

## 2023-11-30 DIAGNOSIS — J9610 Chronic respiratory failure, unspecified whether with hypoxia or hypercapnia: Secondary | ICD-10-CM | POA: Diagnosis not present

## 2023-11-30 DIAGNOSIS — F32A Depression, unspecified: Secondary | ICD-10-CM | POA: Diagnosis not present

## 2023-11-30 DIAGNOSIS — Z7982 Long term (current) use of aspirin: Secondary | ICD-10-CM | POA: Insufficient documentation

## 2023-11-30 DIAGNOSIS — Z9989 Dependence on other enabling machines and devices: Secondary | ICD-10-CM | POA: Diagnosis not present

## 2023-11-30 DIAGNOSIS — N186 End stage renal disease: Secondary | ICD-10-CM | POA: Insufficient documentation

## 2023-11-30 DIAGNOSIS — T50905A Adverse effect of unspecified drugs, medicaments and biological substances, initial encounter: Secondary | ICD-10-CM | POA: Diagnosis not present

## 2023-11-30 DIAGNOSIS — Z992 Dependence on renal dialysis: Secondary | ICD-10-CM | POA: Diagnosis not present

## 2023-11-30 DIAGNOSIS — I509 Heart failure, unspecified: Secondary | ICD-10-CM

## 2023-11-30 DIAGNOSIS — I10 Essential (primary) hypertension: Secondary | ICD-10-CM | POA: Diagnosis present

## 2023-11-30 DIAGNOSIS — E1122 Type 2 diabetes mellitus with diabetic chronic kidney disease: Secondary | ICD-10-CM | POA: Diagnosis not present

## 2023-11-30 DIAGNOSIS — I5032 Chronic diastolic (congestive) heart failure: Secondary | ICD-10-CM | POA: Insufficient documentation

## 2023-11-30 DIAGNOSIS — G3184 Mild cognitive impairment, so stated: Secondary | ICD-10-CM | POA: Diagnosis present

## 2023-11-30 DIAGNOSIS — G9341 Metabolic encephalopathy: Secondary | ICD-10-CM | POA: Diagnosis present

## 2023-11-30 DIAGNOSIS — J441 Chronic obstructive pulmonary disease with (acute) exacerbation: Secondary | ICD-10-CM

## 2023-11-30 DIAGNOSIS — I674 Hypertensive encephalopathy: Secondary | ICD-10-CM

## 2023-11-30 LAB — CBG MONITORING, ED: Glucose-Capillary: 88 mg/dL (ref 70–99)

## 2023-11-30 LAB — URINALYSIS, W/ REFLEX TO CULTURE (INFECTION SUSPECTED)
Bacteria, UA: NONE SEEN
Bilirubin Urine: NEGATIVE
Glucose, UA: 50 mg/dL — AB
Hgb urine dipstick: NEGATIVE
Ketones, ur: NEGATIVE mg/dL
Leukocytes,Ua: NEGATIVE
Nitrite: NEGATIVE
Protein, ur: >=300 mg/dL — AB
Specific Gravity, Urine: 1.008 (ref 1.005–1.030)
pH: 6 (ref 5.0–8.0)

## 2023-11-30 LAB — BLOOD GAS, VENOUS
Acid-Base Excess: 8.2 mmol/L — ABNORMAL HIGH (ref 0.0–2.0)
Bicarbonate: 35.8 mmol/L — ABNORMAL HIGH (ref 20.0–28.0)
O2 Saturation: 91.6 %
Patient temperature: 37
pCO2, Ven: 62 mm[Hg] — ABNORMAL HIGH (ref 44–60)
pH, Ven: 7.37 (ref 7.25–7.43)
pO2, Ven: 65 mm[Hg] — ABNORMAL HIGH (ref 32–45)

## 2023-11-30 LAB — BASIC METABOLIC PANEL
Anion gap: 11 (ref 5–15)
BUN: 24 mg/dL — ABNORMAL HIGH (ref 6–20)
CO2: 30 mmol/L (ref 22–32)
Calcium: 8.2 mg/dL — ABNORMAL LOW (ref 8.9–10.3)
Chloride: 96 mmol/L — ABNORMAL LOW (ref 98–111)
Creatinine, Ser: 3.54 mg/dL — ABNORMAL HIGH (ref 0.61–1.24)
GFR, Estimated: 19 mL/min — ABNORMAL LOW (ref >60)
Glucose, Bld: 102 mg/dL — ABNORMAL HIGH (ref 70–99)
Potassium: 3.4 mmol/L — ABNORMAL LOW (ref 3.5–5.1)
Sodium: 137 mmol/L (ref 135–145)

## 2023-11-30 LAB — CBC
HCT: 38.5 % — ABNORMAL LOW (ref 39.0–52.0)
Hemoglobin: 12.9 g/dL — ABNORMAL LOW (ref 13.0–17.0)
MCH: 32.3 pg (ref 26.0–34.0)
MCHC: 33.5 g/dL (ref 30.0–36.0)
MCV: 96.5 fL (ref 80.0–100.0)
Platelets: 189 10*3/uL (ref 150–400)
RBC: 3.99 MIL/uL — ABNORMAL LOW (ref 4.22–5.81)
RDW: 13.2 % (ref 11.5–15.5)
WBC: 6.7 10*3/uL (ref 4.0–10.5)
nRBC: 0 % (ref 0.0–0.2)

## 2023-11-30 LAB — AMMONIA: Ammonia: 40 umol/L — ABNORMAL HIGH (ref 9–35)

## 2023-11-30 LAB — RESP PANEL BY RT-PCR (RSV, FLU A&B, COVID)  RVPGX2
Influenza A by PCR: NEGATIVE
Influenza B by PCR: NEGATIVE
Resp Syncytial Virus by PCR: NEGATIVE
SARS Coronavirus 2 by RT PCR: NEGATIVE

## 2023-11-30 LAB — URINE DRUG SCREEN, QUALITATIVE (ARMC ONLY)
Amphetamines, Ur Screen: NOT DETECTED
Barbiturates, Ur Screen: NOT DETECTED
Benzodiazepine, Ur Scrn: NOT DETECTED
Cannabinoid 50 Ng, Ur ~~LOC~~: NOT DETECTED
Cocaine Metabolite,Ur ~~LOC~~: POSITIVE — AB
MDMA (Ecstasy)Ur Screen: NOT DETECTED
Methadone Scn, Ur: NOT DETECTED
Opiate, Ur Screen: NOT DETECTED
Phencyclidine (PCP) Ur S: NOT DETECTED
Tricyclic, Ur Screen: NOT DETECTED

## 2023-11-30 LAB — CK: Total CK: 52 U/L (ref 49–397)

## 2023-11-30 LAB — SEDIMENTATION RATE: Sed Rate: 55 mm/h — ABNORMAL HIGH (ref 0–20)

## 2023-11-30 LAB — FOLATE: Folate: 7.7 ng/mL (ref >5.9)

## 2023-11-30 MED ORDER — ACETAMINOPHEN 325 MG RE SUPP: 650.0000 mg | Freq: Four times a day (QID) | RECTAL | Status: DC | PRN

## 2023-11-30 MED ORDER — ACETAMINOPHEN 325 MG PO TABS: 650.0000 mg | ORAL_TABLET | Freq: Four times a day (QID) | ORAL | Status: DC | PRN

## 2023-11-30 MED ORDER — ALBUTEROL SULFATE (2.5 MG/3ML) 0.083% IN NEBU: 2.5000 mg | INHALATION_SOLUTION | RESPIRATORY_TRACT | Status: DC | PRN

## 2023-11-30 MED ORDER — FLUTICASONE FUROATE-VILANTEROL 100-25 MCG/ACT IN AEPB
1.0000 | INHALATION_SPRAY | Freq: Every day | RESPIRATORY_TRACT | Status: DC
Start: 2023-12-01 — End: 2023-12-01
  Administered 2023-12-01: 1 via RESPIRATORY_TRACT
  Filled 2023-11-30: qty 28

## 2023-11-30 MED ORDER — LOSARTAN POTASSIUM 50 MG PO TABS
100.0000 mg | ORAL_TABLET | Freq: Every day | ORAL | Status: DC
  Administered 2023-11-30 – 2023-12-01 (×2): 100 mg via ORAL
  Filled 2023-11-30 (×2): qty 2

## 2023-11-30 MED ORDER — INSULIN ASPART 100 UNIT/ML IJ SOLN
0.0000 [IU] | INTRAMUSCULAR | Status: DC
  Administered 2023-12-01: 2 [IU] via SUBCUTANEOUS
  Administered 2023-12-01: 15 [IU] via SUBCUTANEOUS
  Administered 2023-12-01: 3 [IU] via SUBCUTANEOUS
  Filled 2023-11-30 (×3): qty 1

## 2023-11-30 MED ORDER — HEPARIN SODIUM (PORCINE) 5000 UNIT/ML IJ SOLN
5000.0000 [IU] | Freq: Three times a day (TID) | INTRAMUSCULAR | Status: DC
  Administered 2023-11-30 – 2023-12-01 (×2): 5000 [IU] via SUBCUTANEOUS
  Filled 2023-11-30 (×2): qty 1

## 2023-11-30 MED ORDER — UMECLIDINIUM BROMIDE 62.5 MCG/ACT IN AEPB
1.0000 | INHALATION_SPRAY | Freq: Every day | RESPIRATORY_TRACT | Status: DC
Start: 2023-12-01 — End: 2023-12-01
  Administered 2023-12-01: 1 via RESPIRATORY_TRACT
  Filled 2023-11-30: qty 7

## 2023-11-30 MED ORDER — IPRATROPIUM-ALBUTEROL 0.5-2.5 (3) MG/3ML IN SOLN
3.0000 mL | Freq: Once | RESPIRATORY_TRACT | Status: AC
  Administered 2023-11-30: 3 mL via RESPIRATORY_TRACT
  Filled 2023-11-30: qty 3

## 2023-11-30 MED ORDER — AMLODIPINE BESYLATE 5 MG PO TABS
10.0000 mg | ORAL_TABLET | Freq: Every day | ORAL | Status: DC
  Administered 2023-11-30 – 2023-12-01 (×2): 10 mg via ORAL
  Filled 2023-11-30 (×2): qty 2

## 2023-11-30 MED ORDER — HYDRALAZINE HCL 20 MG/ML IJ SOLN: 10.0000 mg | INTRAMUSCULAR | Status: DC | PRN

## 2023-11-30 MED ORDER — METHYLPREDNISOLONE SODIUM SUCC 125 MG IJ SOLR
125.0000 mg | Freq: Once | INTRAMUSCULAR | Status: AC
Start: 2023-11-30 — End: 2023-11-30
  Administered 2023-11-30: 125 mg via INTRAVENOUS
  Filled 2023-11-30: qty 2

## 2023-11-30 MED ORDER — CARVEDILOL 6.25 MG PO TABS
6.2500 mg | ORAL_TABLET | Freq: Two times a day (BID) | ORAL | Status: DC
  Administered 2023-11-30 – 2023-12-01 (×2): 6.25 mg via ORAL
  Filled 2023-11-30 (×2): qty 1

## 2023-11-30 NOTE — ED Notes (Signed)
This RN to bedside to introduce self to pt. Pt is sleeping at this time.

## 2023-11-30 NOTE — ED Triage Notes (Signed)
Pt in via EMS from dialysis with c/o syncopal episode. Pt completed his treatment and staff was not able to wake him for a minute and now he is lethargic. 196/102

## 2023-11-30 NOTE — ED Triage Notes (Signed)
Pt here with reports of "feeling cold" onset yesterday. Pt slow to respond and keeps falling asleep between questions. Pt does endorse some weakness. T, TH, S dailysis, unsure if he received full tx today. Last tx Thursday.

## 2023-11-30 NOTE — ED Provider Notes (Signed)
[facility] Provider Note    Event Date/Time   First MD Initiated Contact with Patient 11/30/23 1757     (approximate)   History   Weakness   HPI  Derek Cooley. is a 57 y.o. male with extensive past medical history end-stage renal disease on dialysis as well as COPD on chronic home O2 3 L presents to the ER for drowsiness lethargy and fatigue.  Patient unable to provide much additional history as he quickly falls to sleep and started snoring.  He does awaken to voice and appears to be protecting his airway.  Denies any substance abuse or overdose.     Physical Exam   Triage Vital Signs: ED Triage Vitals  Encounter Vitals Group     BP 11/30/23 1625 (!) 177/109     Systolic BP Percentile --      Diastolic BP Percentile --      Pulse Rate 11/30/23 1625 76     Resp 11/30/23 1625 16     Temp 11/30/23 1625 98.3 F (36.8 C)     Temp src --      SpO2 11/30/23 1625 100 %     Weight --      Height --      Head Circumference --      Peak Flow --      Pain Score 11/30/23 1623 5     Pain Loc --      Pain Education --      Exclude from Growth Chart --     Most recent vital signs: Vitals:   11/30/23 1930 11/30/23 1945  BP: (!) 189/101   Pulse: 66 74  Resp: 17 20  Temp:    SpO2: 99% 95%     Constitutional: Drowsy but protecting his airway. Eyes: Conjunctivae are normal.  Head: Atraumatic. Nose: No congestion/rhinnorhea. Mouth/Throat: Mucous membranes are moist.   Neck: Painless ROM.  Cardiovascular:   Good peripheral circulation. Respiratory: Prolonged expiratory phase with wheezing throughout..  Gastrointestinal: Soft and nontender.  Musculoskeletal:  no deformity Neurologic:  MAE spontaneously.  Drowsy and not able to complete full exam 2/2 quickly dozing off to sleep.  Skin:  Skin is warm, dry and intact. No rash noted.      ED Results / Procedures / Treatments   Labs (all labs ordered are listed, but only abnormal results are  displayed) Labs Reviewed  BASIC METABOLIC PANEL - Abnormal; Notable for the following components:      Result Value   Potassium 3.4 (*)    Chloride 96 (*)    Glucose, Bld 102 (*)    BUN 24 (*)    Creatinine, Ser 3.54 (*)    Calcium 8.2 (*)    GFR, Estimated 19 (*)    All other components within normal limits  CBC - Abnormal; Notable for the following components:   RBC 3.99 (*)    Hemoglobin 12.9 (*)    HCT 38.5 (*)    All other components within normal limits  BLOOD GAS, VENOUS - Abnormal; Notable for the following components:   pCO2, Ven 62 (*)    pO2, Ven 65 (*)    Bicarbonate 35.8 (*)    Acid-Base Excess 8.2 (*)    All other components within normal limits  CBG MONITORING, ED     EKG  ED ECG REPORT I, Willy Eddy, the attending physician, personally viewed and interpreted this ECG.   Date: 11/30/2023  EKG Time: 16:24  Rate: 75  Rhythm: sinus  Axis: normal  Intervals: normal  ST&T Change:  no stemi, no depressions    RADIOLOGY Please see ED Course for my review and interpretation.  I personally reviewed all radiographic images ordered to evaluate for the above acute complaints and reviewed radiology reports and findings.  These findings were personally discussed with the patient.  Please see medical record for radiology report.    PROCEDURES:  Critical Care performed: Yes, see critical care procedure note(s)  Procedures   MEDICATIONS ORDERED IN ED: Medications  ipratropium-albuterol (DUONEB) 0.5-2.5 (3) MG/3ML nebulizer solution 3 mL (3 mLs Nebulization Given 11/30/23 1919)  ipratropium-albuterol (DUONEB) 0.5-2.5 (3) MG/3ML nebulizer solution 3 mL (3 mLs Nebulization Given 11/30/23 1918)  methylPREDNISolone sodium succinate (SOLU-MEDROL) 125 mg/2 mL injection 125 mg (125 mg Intravenous Given 11/30/23 1941)     IMPRESSION / MDM / ASSESSMENT AND PLAN / ED COURSE  I reviewed the triage vital signs and the nursing notes.                               Differential diagnosis includes, but is not limited to, Dehydration, sepsis, pna, uti, hypoglycemia, cva, drug effect, withdrawal, encephalitis   Patient presenting to the ER for evaluation of symptoms as described above.  Based on symptoms, risk factors and considered above differential, this presenting complaint could reflect a potentially life-threatening illness therefore the patient will be placed on continuous pulse oximetry and telemetry for monitoring.  Laboratory evaluation will be sent to evaluate for the above complaints.   Altered mental status and presentation with multiple possible etiologies.  He has been admitted for hypercapnic respiratory failure and much of his clinical exam is consistent with that we will place on BiPAP.  X-ray as well as CT imaging also ordered as bleed and CVA also considered given his comorbidities.    Clinical Course as of 11/30/23 1955  Sat Nov 30, 2023  1859 She had a my review and interpretation without evidence of SDH or IPH [PR]  1931 CT head read as no acute findings.  Does have only borderline hypercapnia.  Patient stable on BiPAP.  No significant leukocytosis.  Given comorbidities and presentation will consult hospitalist for admission. [PR]  1953 VBG without significant hypercapnia will wean off BiPAP.  He is mildly hypertensive however will order MRI given concern for possible CVA to explain the patient's encephalopathy.  I do not see any evidence of infectious process at this time.  I discussed case in consultation with hospitalist who is currently accepted patient to their service. [PR]    Clinical Course User Index [PR] Willy Eddy, MD     FINAL CLINICAL IMPRESSION(S) / ED DIAGNOSES   Final diagnoses:  Altered mental status, unspecified altered mental status type  ESRD (end stage renal disease) (HCC)  Chronic obstructive pulmonary disease with acute exacerbation (HCC)     Rx / DC Orders   ED Discharge Orders     None         Note:  This document was prepared using Dragon voice recognition software and may include unintentional dictation errors.    Willy Eddy, MD 11/30/23 Corky Crafts

## 2023-11-30 NOTE — ED Notes (Signed)
Pt transported to MRI with this RN at bedside.

## 2023-11-30 NOTE — ED Notes (Signed)
Attempted to call MRI to coordinate ordered scan for pt currently on bipap. no answer

## 2023-11-30 NOTE — H&P (Addendum)
PCP:   Larae Grooms, NP   Chief Complaint:  Altered mentation  HPI: This a 57 year old male with ESRD (T/TH/Sat), HTN, HFpEF, COPD, HLD, HTN, T2DM, polysubstance abuse, anxiety and depression, history of subarachnoid hemorrhage sp call embolization.  At baseline patient is extremely hypertensive, SBP>200.  Today during hemodialysis.  She became confused and was sent to the ER.  There is no reports of illness prior to hemodialysis.  No nausea, vomiting, fevers, chills, coughing, wheezing, burning urination.  Patient does continue to make urine.  In the ER patient groggy but arousable.  Presenting BP 177/109.  However elevating as high as 209/107.  Other vitals stable.  Potassium 3.4, WBC 6.7 glucose 102.  CT head no acute changes.  CXR no acute disease.  VBG reassuring pH 7.37, CO2 62.  Patient placed on BiPAP empirically in the ER.  Review of Systems:  Unable to obtain.  Past Medical History: Past Medical History:  Diagnosis Date   Abdominal abscess    a.) chronic MRSA infection   Acute pancreatitis    Anxiety    Asthma    Brain aneurysm    a.) 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 b.) RIGHT crainotomy for clip ligation 05/14/2019.   CHF (congestive heart failure) (HCC)    Chronic back pain    Chronic heart failure with preserved ejection fraction (HFpEF) (HCC)    a. 11/2021 Echo: EF 55-60%, no rwma, mod LVH, GrI DD. Nl RV size/fxn. Mildly dil LA.   CKD (chronic kidney disease), stage V (HCC)    Depression    Emphysema of lung (HCC)    Erectile dysfunction    Followed by palliative care service    GERD (gastroesophageal reflux disease)    History of methicillin resistant staphylococcus aureus (MRSA)    HLD (hyperlipidemia)    Hypertension    Mild cognitive impairment    MRSA (methicillin resistant Staphylococcus aureus)    Obesity    OSA (obstructive sleep apnea) 2013   a.) not compliant with  nocturnal PAP therapy; CPAP machine "lost or stolen".   Panic disorder    Perforated bowel (HCC) 12/10/2005   Tempoary Colostomy Bag, Skin Graft for Abd wound   Polysubstance abuse (HCC)    a.) ETOH, tobacco, marijuana, methamphetamines, cocaine, BZO, opioids.   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   T2DM (type 2 diabetes mellitus) (HCC)    Tobacco abuse    Type 2 diabetes mellitus without complication, without long-term current use of insulin (HCC) 04/17/2016   Vitreous hemorrhage (HCC) 03/27/2011   Overview:  Bilateral; 01/2010, from [facility]    Past Surgical History:  Procedure Laterality Date   A/V FISTULAGRAM Left 07/16/2022   Procedure: A/V Fistulagram;  Surgeon: Annice Needy, MD;  Location: [facility];  Service: Cardiovascular;  Laterality: Left;   AV FISTULA PLACEMENT Left 05/24/2022   Procedure: ARTERIOVENOUS (AV) FISTULA CREATION ( RADIAL CEPHALIC);  Surgeon: Annice Needy, MD;  Location: [facility];  Service: Vascular;  Laterality: Left;   CEREBRAL ANEURYSM REPAIR Right 01/26/2010   Procedure: CEREBRAL ANEURYSM REPAIR (COIL EMBOLIZATION)   CEREBRAL ANEURYSM REPAIR Right 05/14/2019   Procedure: CRAINOTOMY FOR CEREBRAL ANEURYSM REPAIR (CLIP LIGATION)   COLON SURGERY  12/10/2005   colostomy bag placed s/p perforated bowel   COLONOSCOPY WITH PROPOFOL N/A 05/18/2020   Procedure: COLONOSCOPY WITH  PROPOFOL;  Surgeon: Toney Reil, MD;  Location: [facility];  Service: Gastroenterology;  Laterality: N/A;   DIALYSIS/PERMA CATHETER INSERTION N/A 06/27/2022   Procedure: DIALYSIS/PERMA CATHETER INSERTION;  Surgeon: Renford Dills, MD;  Location: [facility];  Service: Cardiovascular;  Laterality: N/A;   DIALYSIS/PERMA CATHETER INSERTION N/A 06/28/2023   Procedure: DIALYSIS/PERMA CATHETER INSERTION;  Surgeon: Renford Dills, MD;  Location: [facility]  INV[facility] CV [facility];  Service: Cardiovascular;  Laterality: N/A;   DIALYSIS/PERMA CATHETER REMOVAL N/A 05/20/2023   Procedure: DIALYSIS/PERMA CATHETER REMOVAL;  Surgeon: Annice Needy, MD;  Location: [facility];  Service: Cardiovascular;  Laterality: N/A;   DIALYSIS/PERMA CATHETER REMOVAL N/A 09/09/2023   Procedure: DIALYSIS/PERMA CATHETER REMOVAL;  Surgeon: Annice Needy, MD;  Location: [facility];  Service: Cardiovascular;  Laterality: N/A;   KNEE SURGERY Right    Perforated bowel      Medications: Prior to Admission medications   Medication Sig Start Date End Date Taking? Authorizing Provider  albuterol (VENTOLIN HFA) 108 (90 Base) MCG/ACT inhaler Inhale 1-2 puffs into the lungs every 6 (six) hours as needed for wheezing or shortness of breath. 11/05/23   Larae Grooms, NP  amLODipine (NORVASC) 10 MG tablet Take 1 tablet (10 mg total) by mouth daily. 10/31/23   Darlin Priestly, MD  aspirin EC 81 MG tablet Take 1 tablet (81 mg total) by mouth daily. Swallow whole. 10/01/23   Tresa Moore, MD  carvedilol (COREG) 6.25 MG tablet Take 1 tablet (6.25 mg total) by mouth 2 (two) times daily with a meal. 10/31/23   Darlin Priestly, MD  DULoxetine (CYMBALTA) 60 MG capsule Take 1 capsule (60 mg total) by mouth daily. 10/01/23 12/30/23  Tresa Moore, MD  ezetimibe (ZETIA) 10 MG tablet Take 1 tablet (10 mg total) by mouth daily. 10/01/23 10/31/23  Tresa Moore, MD  lidocaine-prilocaine (EMLA) cream Apply 1 Application topically daily. 08/23/22   [provider]  losartan (COZAAR) 100 MG tablet Take 1 tablet (100 mg total) by mouth daily. 10/31/23   Darlin Priestly, MD  OXYGEN Inhale 3 L into the lungs daily.    [provider]  pantoprazole (PROTONIX) 40 MG tablet TAKE 1 TABLET(40 MG) BY MOUTH TWICE DAILY AS NEEDED 01/23/23   Larae Grooms, NP  TRELEGY ELLIPTA 100-62.5-25 MCG/ACT AEPB Inhale 1 puff into the lungs daily. 06/10/23   Mecum, Erin E, PA-C    Allergies:    Allergies  Allergen Reactions   Codeine Hives and Other (See Comments)   Atorvastatin     Joint Aches - Severe Joint Aches - Severe Joint Aches - Severe   Avelox [Moxifloxacin Hcl In Nacl]     Muscle pain   Buprenorphine     Mouth sores, confusion, shaking   Dilaudid [Hydromorphone Hcl] Hives   Fluoxetine Itching   Levofloxacin Other (See Comments)    Joint Pain   Morphine    Other     Muscle pain   Suboxone [Buprenorphine Hcl-Naloxone Hcl] Other (See Comments)    Rash and confused   Vancomycin     Renal insufficiency    Social History:  reports that he has been smoking cigarettes. He started smoking about 39 years ago. He has a 40 pack-year smoking history. He has never used smokeless tobacco. He reports current alcohol use. He reports that he does not currently use drugs after having used the following drugs: "Crack" cocaine, Amphetamines, Methamphetamines, Benzodiazepines, Cocaine, Marijuana, and Other-see comments.  Family  History: Family History  Problem Relation Age of Onset   Arthritis Mother    Asthma Mother    Diabetes Mother    Heart disease Mother    Hyperlipidemia Mother    Hypertension Mother    Kidney disease Mother    Thyroid disease Mother    Lung disease Mother    Anxiety disorder Mother    Depression Mother    Diabetes Father    Heart disease Father    Depression Father    Anxiety disorder Father    Arthritis Sister    Asthma Sister    Hyperlipidemia Sister    Hypertension Sister    Lung disease Sister    Anxiety disorder Sister    Depression Sister    Hyperlipidemia Brother    Hypertension Brother    Diabetes Sister    Heart disease Sister    Depression Sister    Anxiety disorder Sister    Anxiety disorder Brother    Depression Brother    Heart disease Brother     Physical Exam: Vitals:   11/30/23 1930 11/30/23 1945 11/30/23 2000 11/30/23 2015  BP: (!) 189/101  (!) 187/89   Pulse: 66 74 65 66  Resp: 17 20 18 15   Temp:       SpO2: 99% 95% 100% 99%    General: A and O x 3, well developed, on BiPAP.  In no distress, sleepy Eyes: Pink conjunctiva, no scleral icterus ENT: Moist oral mucosa, neck supple, no thyromegaly Lungs: CTA B/L, no wheeze, no crackles, no use of accessory muscles Cardiovascular: RRR, no regurgitation, no gallops, no murmurs. No carotid bruits, no JVD Abdomen: soft, positive BS, NTND, no organomegaly, not an acute abdomen GU: not examined Neuro: CN II - XII grossly intact, sensation intact Musculoskeletal: strength 5/5 all extremities, no  edema Skin: no rash, no subcutaneous crepitation, no decubitus Psych: appropriate patient   Labs on Admission:  Recent Labs    11/30/23 1627  NA 137  K 3.4*  CL 96*  CO2 30  GLUCOSE 102*  BUN 24*  CREATININE 3.54*  CALCIUM 8.2*    Recent Labs    11/30/23 1627  WBC 6.7  HGB 12.9*  HCT 38.5*  MCV 96.5  PLT 189    Radiological Exams on Admission: CT HEAD WO CONTRAST ( ) Result Date: 11/30/2023 CLINICAL DATA:  Provided history: Mental status change, unknown cause. Additional history provided: the patient reports feeling cold and weak. Somnolence. EXAM: CT HEAD WITHOUT CONTRAST TECHNIQUE: Contiguous axial images were obtained from the base of the skull through the vertex without intravenous contrast. RADIATION DOSE REDUCTION: This exam was performed according to the departmental dose-optimization program which includes automated exposure control, adjustment of the mA and/or kV according to patient size and/or use of iterative reconstruction technique. COMPARISON:  Head CT 09/27/2023. FINDINGS: Brain: No age advanced or lobar predominant parenchymal atrophy. 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 Adjacent focus of chronic encephalomalacia/gliosis within the anteromedial right temporal lobe. Unchanged tract within the left frontal lobe (deep to a burr hole),  presumably from prior ventricular catheter placement. Chronic lacunar infarct again noted at the right thalamocapsular junction. Prominent perivascular space within the right basal ganglia inferiorly. There is no acute intracranial hemorrhage. No acute demarcated cortical infarct. No extra-axial fluid collection. No evidence of an intracranial mass. No midline shift. Vascular: No hyperdense vessel. Atherosclerotic calcifications. Embolization coil mass and aneurysm clip in the right paraclinoid  region with associated streak/beam hardening artifact. Skull: Right pterional cranioplasty. Burr hole within the left frontal calvarium. Sinuses/Orbits: No orbital mass or acute orbital finding. Small to moderate-volume secretions, and mild background mucosal thickening, within the bilateral maxillary sinuses. IMPRESSION: 1.  No evidence of an acute intracranial abnormality. 2. 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 3. 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 4. Paranasal sinus disease as described. Electronically Signed   By: Jackey Loge D.O.   On: 11/30/2023 19:27   DG Chest Portable 1 View Result Date: 11/30/2023 CLINICAL DATA:  Altered mental status. EXAM: PORTABLE CHEST 1 VIEW COMPARISON:  October 26, 2023 FINDINGS: The cardiac silhouette is mildly enlarged and unchanged in size. There is no evidence of an acute infiltrate, pleural effusion or pneumothorax. The visualized skeletal structures are unremarkable. IMPRESSION: No active cardiopulmonary disease. Electronically Signed   By: Aram Candela M.D.   On: 11/30/2023 18:58    Assessment/Plan Present on Admission:  Acute metabolic encephalopathy -Ruling out infection as etiology of confusion: respiratory panel (covid, RSV, H flu), UA ordered.   -History of polysubstance abuse, UDS (serum) ordered.   -Hypertensive encephalopathy in the differential diagnosis, goal SBP  ~180.   Home medications resumed.  As needed hydralazine ordered. MRI brain to evaluate for press, CVA -Ammonia, vitamin B12, folate levels ordered. -Discontinue BiPAP. -Likely hypertensive encephalopathy vs polysubstance use.  Awaiting results.  Monitor overnight   Chronic obstructive pulmonary disease (COPD) (HCC)  Chronic respiratory failure -Nebulizers as needed, oxygen ordered -Trelegy resumed   ESRD  -HD T/TH/Sat.  -A.m. team to consult nephrology if patient admitted on Tuesday or hyperkalemia   Anxiety and depression -Cymbalta resumed   Dyslipidemia -Zetia resumed  HTN -Losartan, Norvasc resumed   Mild cognitive impairment  OSA (obstructive sleep apnea) -CPAP   Derek Cooley 11/30/2023, 8:48 PM

## 2023-12-01 DIAGNOSIS — N186 End stage renal disease: Secondary | ICD-10-CM | POA: Diagnosis not present

## 2023-12-01 DIAGNOSIS — I674 Hypertensive encephalopathy: Secondary | ICD-10-CM | POA: Diagnosis not present

## 2023-12-01 DIAGNOSIS — I10 Essential (primary) hypertension: Secondary | ICD-10-CM | POA: Diagnosis not present

## 2023-12-01 DIAGNOSIS — F419 Anxiety disorder, unspecified: Secondary | ICD-10-CM

## 2023-12-01 DIAGNOSIS — G928 Other toxic encephalopathy: Secondary | ICD-10-CM

## 2023-12-01 DIAGNOSIS — F32A Depression, unspecified: Secondary | ICD-10-CM

## 2023-12-01 LAB — BASIC METABOLIC PANEL
Anion gap: 11 (ref 5–15)
BUN: 34 mg/dL — ABNORMAL HIGH (ref 6–20)
CO2: 24 mmol/L (ref 22–32)
Calcium: 8.3 mg/dL — ABNORMAL LOW (ref 8.9–10.3)
Chloride: 97 mmol/L — ABNORMAL LOW (ref 98–111)
Creatinine, Ser: 4.31 mg/dL — ABNORMAL HIGH (ref 0.61–1.24)
GFR, Estimated: 15 mL/min — ABNORMAL LOW (ref >60)
Glucose, Bld: 186 mg/dL — ABNORMAL HIGH (ref 70–99)
Potassium: 4.6 mmol/L (ref 3.5–5.1)
Sodium: 132 mmol/L — ABNORMAL LOW (ref 135–145)

## 2023-12-01 LAB — CBC WITH DIFFERENTIAL/PLATELET
Abs Immature Granulocytes: 0.04 10*3/uL (ref 0.00–0.07)
Basophils Absolute: 0 10*3/uL (ref 0.0–0.1)
Basophils Relative: 0 %
Eosinophils Absolute: 0 10*3/uL (ref 0.0–0.5)
Eosinophils Relative: 0 %
HCT: 38.9 % — ABNORMAL LOW (ref 39.0–52.0)
Hemoglobin: 13 g/dL (ref 13.0–17.0)
Immature Granulocytes: 1 %
Lymphocytes Relative: 7 %
Lymphs Abs: 0.4 10*3/uL — ABNORMAL LOW (ref 0.7–4.0)
MCH: 32.1 pg (ref 26.0–34.0)
MCHC: 33.4 g/dL (ref 30.0–36.0)
MCV: 96 fL (ref 80.0–100.0)
Monocytes Absolute: 0.1 10*3/uL (ref 0.1–1.0)
Monocytes Relative: 1 %
Neutro Abs: 5.3 10*3/uL (ref 1.7–7.7)
Neutrophils Relative %: 91 %
Platelets: 190 10*3/uL (ref 150–400)
RBC: 4.05 MIL/uL — ABNORMAL LOW (ref 4.22–5.81)
RDW: 13.2 % (ref 11.5–15.5)
WBC: 5.8 10*3/uL (ref 4.0–10.5)
nRBC: 0 % (ref 0.0–0.2)

## 2023-12-01 LAB — VITAMIN B12: Vitamin B-12: 202 pg/mL (ref 180–914)

## 2023-12-01 LAB — CBG MONITORING, ED
Glucose-Capillary: 144 mg/dL — ABNORMAL HIGH (ref 70–99)
Glucose-Capillary: 168 mg/dL — ABNORMAL HIGH (ref 70–99)
Glucose-Capillary: 421 mg/dL — ABNORMAL HIGH (ref 70–99)

## 2023-12-01 LAB — C-REACTIVE PROTEIN: CRP: 0.6 mg/dL (ref <1.0)

## 2023-12-01 LAB — MAGNESIUM: Magnesium: 1.9 mg/dL (ref 1.7–2.4)

## 2023-12-01 MED ORDER — NICOTINE 14 MG/24HR TD PT24
14.0000 mg | MEDICATED_PATCH | Freq: Every day | TRANSDERMAL | Status: DC
  Administered 2023-12-01: 14 mg via TRANSDERMAL
  Filled 2023-12-01: qty 1

## 2023-12-01 MED ORDER — NICOTINE 14 MG/24HR TD PT24: 14.0000 mg | MEDICATED_PATCH | Freq: Every day | TRANSDERMAL | 0 refills | Status: DC

## 2023-12-01 NOTE — Progress Notes (Signed)
[facility]  ROUNDING NOTE   Subjective:   Derek Jasmine. is a 57 year old male with past medical conditions including COPD on home oxygen at 3 L, diabetes, struct of sleep apnea, anemia, hypertension, tobacco use, polysubstance abuse, CHF, end-stage renal disease on hemodialysis. Patient presents to emergency department from his outpatient dialysis clinic with syncopal episodes and decreased LOC.  Patient was admitted under observation for Acute metabolic encephalopathy [G93.41]  Patient is known to our practice and receives outpatient dialysis treatments at [facility] on a TTS schedule, supervised by Dr. Cherylann Ratel.  Patient did receive full treatment prior to presenting to the emergency department.  Patient states he has no memory of events after starting dialysis.  Currently seen sitting up on stretcher.  Alert and oriented.  Room air.  No lower extremity edema.  No complaints to offer.  Labs unremarkable for renal patient.  Urine drug screen was positive for cocaine.  Ammonia 40.   Objective:  Vital signs in last 24 hours:  Temp:  [97.7 F (36.5 C)-98.6 F (37 C)] 97.7 F (36.5 C) (12/22 0509) Pulse Rate:  [61-93] 82 (12/22 1100) Resp:  [12-26] 26 (12/22 1100) BP: (108-209)/(65-110) 144/77 (12/22 1100) SpO2:  [91 %-100 %] 91 % (12/22 1100) FiO2 (%):  [35 %] 35 % (12/21 1919)  Weight change:  There were no vitals filed for this visit.  Intake/Output: No intake/output data recorded.   Intake/Output this shift:  No intake/output data recorded.  Physical Exam: General: NAD  Head: Normocephalic, atraumatic. Moist oral mucosal membranes  Eyes: Anicteric  Lungs:  Clear to auscultation, normal effort  Heart: Regular rate and rhythm  Abdomen:  Soft, nontender, nondistended  Extremities: No peripheral edema.  Neurologic: Nonfocal, moving all four extremities  Skin: No lesions  Access: Right AVF    Basic Metabolic Panel: Recent Labs  Lab 11/30/23 1627  12/01/23 0519  NA 137 132*  K 3.4* 4.6  CL 96* 97*  CO2 30 24  GLUCOSE 102* 186*  BUN 24* 34*  CREATININE 3.54* 4.31*  CALCIUM 8.2* 8.3*  MG  --  1.9    Liver Function Tests: No results for input(s): "AST", "ALT", "ALKPHOS", "BILITOT", "PROT", "ALBUMIN" in the last 168 hours. No results for input(s): "LIPASE", "AMYLASE" in the last 168 hours. Recent Labs  Lab 11/30/23 2104  AMMONIA 40*    CBC: Recent Labs  Lab 11/30/23 1627 12/01/23 0519  WBC 6.7 5.8  NEUTROABS  --  5.3  HGB 12.9* 13.0  HCT 38.5* 38.9*  MCV 96.5 96.0  PLT 189 190    Cardiac Enzymes: Recent Labs  Lab 11/30/23 2104  CKTOTAL 52    BNP: Invalid input(s): "POCBNP"  CBG: Recent Labs  Lab 11/30/23 1945 12/01/23 0023 12/01/23 0513 12/01/23 0828  GLUCAP 88 144* 168* 421*    Microbiology: Results for orders placed or performed during the hospital encounter of 11/30/23  Resp panel by RT-PCR (RSV, Flu A&B, Covid) Anterior Nasal Swab     Status: None   Collection Time: 11/30/23  9:05 PM   Specimen: Anterior Nasal Swab  Result Value Ref Range Status   SARS Coronavirus 2 by RT PCR NEGATIVE NEGATIVE Final    Comment: (NOTE) SARS-CoV-2 target nucleic acids are NOT DETECTED.  The SARS-CoV-2 RNA is generally detectable in upper respiratory specimens during the acute phase of infection. The lowest concentration of SARS-CoV-2 viral copies this assay can detect is 138 copies/mL. A negative result does not preclude SARS-Cov-2 infection and  should not be used as the sole basis for treatment or other patient management decisions. A negative result may occur with  improper specimen collection/handling, submission of specimen other than nasopharyngeal swab, presence of viral mutation(s) within the areas targeted by this assay, and inadequate number of viral copies(<138 copies/mL). A negative result must be combined with clinical observations, patient history, and epidemiological information. The  expected result is Negative.  Fact Sheet for Patients:  BloggerCourse.com  Fact Sheet for Healthcare Providers:  SeriousBroker.it  This test is no t yet approved or cleared by the [place] FDA and  has been authorized for detection and/or diagnosis of SARS-CoV-2 by FDA under an Emergency Use Authorization (EUA). This EUA will remain  in effect (meaning this test can be used) for the duration of the COVID-19 declaration under Section 564(b)(1) of the Act, 21 U.S.C.section 360bbb-3(b)(1), unless the authorization is terminated  or revoked sooner.       Influenza A by PCR NEGATIVE NEGATIVE Final   Influenza B by PCR NEGATIVE NEGATIVE Final    Comment: (NOTE) The Xpert Xpress SARS-CoV-2/FLU/RSV plus assay is intended as an aid in the diagnosis of influenza from Nasopharyngeal swab specimens and should not be used as a sole basis for treatment. Nasal washings and aspirates are unacceptable for Xpert Xpress SARS-CoV-2/FLU/RSV testing.  Fact Sheet for Patients: BloggerCourse.com  Fact Sheet for Healthcare Providers: SeriousBroker.it  This test is not yet approved or cleared by the [place] FDA and has been authorized for detection and/or diagnosis of SARS-CoV-2 by FDA under an Emergency Use Authorization (EUA). This EUA will remain in effect (meaning this test can be used) for the duration of the COVID-19 declaration under Section 564(b)(1) of the Act, 21 U.S.C. section 360bbb-3(b)(1), unless the authorization is terminated or revoked.     Resp Syncytial Virus by PCR NEGATIVE NEGATIVE Final    Comment: (NOTE) Fact Sheet for Patients: BloggerCourse.com  Fact Sheet for Healthcare Providers: SeriousBroker.it  This test is not yet approved or cleared by the [place] FDA and has been authorized for detection and/or  diagnosis of SARS-CoV-2 by FDA under an Emergency Use Authorization (EUA). This EUA will remain in effect (meaning this test can be used) for the duration of the COVID-19 declaration under Section 564(b)(1) of the Act, 21 U.S.C. section 360bbb-3(b)(1), unless the authorization is terminated or revoked.  Performed at [facility], [address] [place], [place] [address]     Coagulation Studies: No results for input(s): "LABPROT", "INR" in the last 72 hours.  Urinalysis: Recent Labs    11/30/23 2311  COLORURINE YELLOW*  LABSPEC 1.008  PHURINE 6.0  GLUCOSEU 50*  HGBUR NEGATIVE  BILIRUBINUR NEGATIVE  KETONESUR NEGATIVE  PROTEINUR >=300*  NITRITE NEGATIVE  LEUKOCYTESUR NEGATIVE      Imaging: MR BRAIN WO CONTRAST Result Date: 11/30/2023 CLINICAL DATA:  Altered mental status EXAM: MRI HEAD WITHOUT CONTRAST TECHNIQUE: Multiplanar, multiecho pulse sequences of the brain and surrounding structures were obtained without intravenous contrast. COMPARISON:  01/05/2022 brain MRI FINDINGS: Brain: No acute infarct, mass effect or extra-axial collection. Multifocal chronic microhemorrhage in the brainstem. Unchanged right anterior temporal encephalomalacia/gliosis. Gliosis along an old left frontal approach shunt tract. There is multifocal hyperintense T2-weighted signal within the white matter. Parenchymal volume and CSF spaces are normal. 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 The midline structures are normal. Vascular: Normal flow voids. 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 Skull and upper cervical spine: Normal calvarium and skull base. Visualized upper  cervical spine and soft tissues are normal. Sinuses/Orbits:Retained secretions in both maxillary sinuses. Normal orbits. IMPRESSION: 1. No acute intracranial abnormality. 2. Unchanged right anterior temporal encephalomalacia/gliosis. 3. Unchanged multifocal hyperintense T2-weighted signal within the white matter, likely  chronic small vessel ischemia. Electronically Signed   By: Deatra Robinson M.D.   On: 11/30/2023 23:14   CT HEAD WO CONTRAST ( ) Result Date: 11/30/2023 CLINICAL DATA:  Provided history: Mental status change, unknown cause. Additional history provided: the patient reports feeling cold and weak. Somnolence. EXAM: CT HEAD WITHOUT CONTRAST TECHNIQUE: Contiguous axial images were obtained from the base of the skull through the vertex without intravenous contrast. RADIATION DOSE REDUCTION: This exam was performed according to the departmental dose-optimization program which includes automated exposure control, adjustment of the mA and/or kV according to patient size and/or use of iterative reconstruction technique. COMPARISON:  Head CT 09/27/2023. FINDINGS: Brain: No age advanced or lobar predominant parenchymal atrophy. 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 Adjacent focus of chronic encephalomalacia/gliosis within the anteromedial right temporal lobe. Unchanged tract within the left frontal lobe (deep to a burr hole), presumably from prior ventricular catheter placement. Chronic lacunar infarct again noted at the right thalamocapsular junction. Prominent perivascular space within the right basal ganglia inferiorly. There is no acute intracranial hemorrhage. No acute demarcated cortical infarct. No extra-axial fluid collection. No evidence of an intracranial mass. No midline shift. Vascular: No hyperdense vessel. Atherosclerotic calcifications. 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 Skull: Right pterional cranioplasty. Burr hole within the left frontal calvarium. Sinuses/Orbits: No orbital mass or acute orbital finding. Small to moderate-volume secretions, and mild background mucosal thickening, within the bilateral maxillary sinuses. IMPRESSION: 1.  No  evidence of an acute intracranial abnormality. 2. 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 3. 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 4. Paranasal sinus disease as described. Electronically Signed   By: Jackey Loge D.O.   On: 11/30/2023 19:27   DG Chest Portable 1 View Result Date: 11/30/2023 CLINICAL DATA:  Altered mental status. EXAM: PORTABLE CHEST 1 VIEW COMPARISON:  October 26, 2023 FINDINGS: The cardiac silhouette is mildly enlarged and unchanged in size. There is no evidence of an acute infiltrate, pleural effusion or pneumothorax. The visualized skeletal structures are unremarkable. IMPRESSION: No active cardiopulmonary disease. Electronically Signed   By: Aram Candela M.D.   On: 11/30/2023 18:58     Medications:     amLODipine  10 mg Oral Daily   carvedilol  6.25 mg Oral BID WC   fluticasone furoate-vilanterol  1 puff Inhalation Daily   And   umeclidinium bromide  1 puff Inhalation Daily   heparin  5,000 Units Subcutaneous Q8H   insulin aspart  0-15 Units Subcutaneous Q4H   losartan  100 mg Oral Daily   nicotine  14 mg Transdermal Daily   acetaminophen **OR** acetaminophen, albuterol, hydrALAZINE  Assessment/ Plan:  Mr. Derek Koelle. is a 57 y.o.  male with past medical conditions including COPD on home oxygen at 3 L, diabetes, struct of sleep apnea, anemia, hypertension, tobacco use, polysubstance abuse, CHF, end-stage renal disease on hemodialysis. Patient presents to emergency department from his outpatient dialysis clinic with syncopal episodes and decreased LOC.  Patient was admitted under observation for Acute metabolic encephalopathy [G93.41]   Acute metabolic encephalopathy, reported syncopal episode and decreased LOC after dialysis  treatment at outpatient clinic.  Ammonia level 40 on ED arrival.  UDS positive for cocaine.  Current workup negative for infection.    2.  End-stage  renal disease on hemodialysis.  Patient completed treatment prior to ED arrival.  If remains admitted, will perform dialysis tomorrow due to holiday scheduling.  3. Anemia of chronic kidney disease Lab Results  Component Value Date   HGB 13.0 12/01/2023    Hgb within optimal range. No need for ESA's.  4. Secondary Hyperparathyroidism: with outpatient labs: PTH 618, phosphorus 5.2, calcium 8.6 on 11/19/23.   Lab Results  Component Value Date   PTH 135 (H) 12/07/2021   PTH Comment 12/07/2021   CALCIUM 8.3 (L) 12/01/2023   PHOS 4.6 10/31/2023    Will continue to monitor bone minerals during this admission.   LOS: 0 Derek Cooley 12/22/20241:14 PM
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
## 2023-12-01 NOTE — ED Notes (Signed)
Admitting provider at bedside. Per MD pt removed from BiPap. Placed on chronic 3 L oxygen. SpO2 remains 96-98%. Pt remains on VS monitor. Mentation improving pt alert - previously arousal to voice.

## 2023-12-10 LAB — DRUG SCREEN 10 W/CONF, SERUM
Amphetamines, IA: NEGATIVE ng/mL
Barbiturates, IA: NEGATIVE ug/mL
Benzodiazepines, IA: NEGATIVE ng/mL
Methadone, IA: NEGATIVE ng/mL
Opiates, IA: NEGATIVE ng/mL
Oxycodones, IA: NEGATIVE ng/mL
Phencyclidine, IA: NEGATIVE ng/mL
Propoxyphene, IA: NEGATIVE ng/mL
THC(Marijuana) Metabolite, IA: NEGATIVE ng/mL

## 2023-12-10 LAB — COCAINE,MS,WB/SP RFX

## 2023-12-30 ENCOUNTER — Institutional Professional Consult (permissible substitution): Payer: Medicare Other | Admitting: Student in an Organized Health Care Education/Training Program

## 2024-01-02 ENCOUNTER — Encounter (INDEPENDENT_AMBULATORY_CARE_PROVIDER_SITE_OTHER): Payer: Self-pay

## 2024-01-05 ENCOUNTER — Other Ambulatory Visit: Payer: Self-pay

## 2024-01-05 ENCOUNTER — Inpatient Hospital Stay
Admission: EM | Admit: 2024-01-05 | Discharge: 2024-01-15 | DRG: 304 | Disposition: A | Discharge status: Home / Self Care | Payer: Medicare Other | Attending: Internal Medicine | Admitting: Internal Medicine

## 2024-01-05 DIAGNOSIS — I5032 Chronic diastolic (congestive) heart failure: Secondary | ICD-10-CM | POA: Diagnosis present

## 2024-01-05 DIAGNOSIS — T2026XA Burn of second degree of forehead and cheek, initial encounter: Secondary | ICD-10-CM | POA: Diagnosis present

## 2024-01-05 DIAGNOSIS — Z7982 Long term (current) use of aspirin: Secondary | ICD-10-CM

## 2024-01-05 DIAGNOSIS — Z888 Allergy status to other drugs, medicaments and biological substances status: Secondary | ICD-10-CM

## 2024-01-05 DIAGNOSIS — Z992 Dependence on renal dialysis: Secondary | ICD-10-CM

## 2024-01-05 DIAGNOSIS — Z818 Family history of other mental and behavioral disorders: Secondary | ICD-10-CM

## 2024-01-05 DIAGNOSIS — Z7951 Long term (current) use of inhaled steroids: Secondary | ICD-10-CM

## 2024-01-05 DIAGNOSIS — D631 Anemia in chronic kidney disease: Secondary | ICD-10-CM | POA: Diagnosis present

## 2024-01-05 DIAGNOSIS — Z8249 Family history of ischemic heart disease and other diseases of the circulatory system: Secondary | ICD-10-CM

## 2024-01-05 DIAGNOSIS — Z91128 Patient's intentional underdosing of medication regimen for other reason: Secondary | ICD-10-CM

## 2024-01-05 DIAGNOSIS — E663 Overweight: Secondary | ICD-10-CM | POA: Insufficient documentation

## 2024-01-05 DIAGNOSIS — J101 Influenza due to other identified influenza virus with other respiratory manifestations: Secondary | ICD-10-CM | POA: Diagnosis present

## 2024-01-05 DIAGNOSIS — Z881 Allergy status to other antibiotic agents status: Secondary | ICD-10-CM

## 2024-01-05 DIAGNOSIS — I160 Hypertensive urgency: Principal | ICD-10-CM | POA: Diagnosis present

## 2024-01-05 DIAGNOSIS — E1122 Type 2 diabetes mellitus with diabetic chronic kidney disease: Secondary | ICD-10-CM | POA: Diagnosis present

## 2024-01-05 DIAGNOSIS — N2581 Secondary hyperparathyroidism of renal origin: Secondary | ICD-10-CM | POA: Diagnosis present

## 2024-01-05 DIAGNOSIS — E785 Hyperlipidemia, unspecified: Secondary | ICD-10-CM | POA: Diagnosis present

## 2024-01-05 DIAGNOSIS — Z841 Family history of disorders of kidney and ureter: Secondary | ICD-10-CM

## 2024-01-05 DIAGNOSIS — K219 Gastro-esophageal reflux disease without esophagitis: Secondary | ICD-10-CM | POA: Diagnosis present

## 2024-01-05 DIAGNOSIS — I249 Acute ischemic heart disease, unspecified: Secondary | ICD-10-CM | POA: Diagnosis present

## 2024-01-05 DIAGNOSIS — Z8349 Family history of other endocrine, nutritional and metabolic diseases: Secondary | ICD-10-CM

## 2024-01-05 DIAGNOSIS — I132 Hypertensive heart and chronic kidney disease with heart failure and with stage 5 chronic kidney disease, or end stage renal disease: Secondary | ICD-10-CM | POA: Diagnosis present

## 2024-01-05 DIAGNOSIS — Z8261 Family history of arthritis: Secondary | ICD-10-CM

## 2024-01-05 DIAGNOSIS — X088XXA Exposure to other specified smoke, fire and flames, initial encounter: Secondary | ICD-10-CM | POA: Diagnosis present

## 2024-01-05 DIAGNOSIS — E872 Acidosis, unspecified: Secondary | ICD-10-CM | POA: Insufficient documentation

## 2024-01-05 DIAGNOSIS — T2010XA Burn of first degree of head, face, and neck, unspecified site, initial encounter: Secondary | ICD-10-CM

## 2024-01-05 DIAGNOSIS — T465X6A Underdosing of other antihypertensive drugs, initial encounter: Secondary | ICD-10-CM | POA: Diagnosis present

## 2024-01-05 DIAGNOSIS — E871 Hypo-osmolality and hyponatremia: Secondary | ICD-10-CM | POA: Insufficient documentation

## 2024-01-05 DIAGNOSIS — N186 End stage renal disease: Secondary | ICD-10-CM

## 2024-01-05 DIAGNOSIS — Z5982 Transportation insecurity: Secondary | ICD-10-CM

## 2024-01-05 DIAGNOSIS — G4733 Obstructive sleep apnea (adult) (pediatric): Secondary | ICD-10-CM | POA: Diagnosis present

## 2024-01-05 DIAGNOSIS — Z9981 Dependence on supplemental oxygen: Secondary | ICD-10-CM

## 2024-01-05 DIAGNOSIS — Z833 Family history of diabetes mellitus: Secondary | ICD-10-CM

## 2024-01-05 DIAGNOSIS — E66811 Obesity, class 1: Secondary | ICD-10-CM | POA: Diagnosis present

## 2024-01-05 DIAGNOSIS — J9621 Acute and chronic respiratory failure with hypoxia: Secondary | ICD-10-CM | POA: Diagnosis present

## 2024-01-05 DIAGNOSIS — F17210 Nicotine dependence, cigarettes, uncomplicated: Secondary | ICD-10-CM | POA: Diagnosis present

## 2024-01-05 DIAGNOSIS — Z6832 Body mass index (BMI) 32.0-32.9, adult: Secondary | ICD-10-CM

## 2024-01-05 DIAGNOSIS — I2489 Other forms of acute ischemic heart disease: Secondary | ICD-10-CM | POA: Diagnosis present

## 2024-01-05 DIAGNOSIS — Z79899 Other long term (current) drug therapy: Secondary | ICD-10-CM

## 2024-01-05 DIAGNOSIS — J439 Emphysema, unspecified: Secondary | ICD-10-CM | POA: Diagnosis present

## 2024-01-05 DIAGNOSIS — Z23 Encounter for immunization: Secondary | ICD-10-CM

## 2024-01-05 DIAGNOSIS — Z91199 Patient's noncompliance with other medical treatment and regimen due to unspecified reason: Secondary | ICD-10-CM

## 2024-01-05 DIAGNOSIS — Z83438 Family history of other disorder of lipoprotein metabolism and other lipidemia: Secondary | ICD-10-CM

## 2024-01-05 DIAGNOSIS — Z825 Family history of asthma and other chronic lower respiratory diseases: Secondary | ICD-10-CM

## 2024-01-05 DIAGNOSIS — I259 Chronic ischemic heart disease, unspecified: Secondary | ICD-10-CM | POA: Diagnosis present

## 2024-01-05 DIAGNOSIS — Z720 Tobacco use: Secondary | ICD-10-CM | POA: Diagnosis present

## 2024-01-05 DIAGNOSIS — R072 Precordial pain: Secondary | ICD-10-CM | POA: Diagnosis not present

## 2024-01-05 DIAGNOSIS — Z8614 Personal history of Methicillin resistant Staphylococcus aureus infection: Secondary | ICD-10-CM

## 2024-01-05 DIAGNOSIS — T2020XA Burn of second degree of head, face, and neck, unspecified site, initial encounter: Secondary | ICD-10-CM

## 2024-01-05 DIAGNOSIS — I493 Ventricular premature depolarization: Secondary | ICD-10-CM | POA: Diagnosis present

## 2024-01-05 DIAGNOSIS — I10 Essential (primary) hypertension: Secondary | ICD-10-CM | POA: Diagnosis present

## 2024-01-05 DIAGNOSIS — Z91158 Patient's noncompliance with renal dialysis for other reason: Secondary | ICD-10-CM

## 2024-01-05 DIAGNOSIS — R0602 Shortness of breath: Principal | ICD-10-CM

## 2024-01-05 DIAGNOSIS — J4489 Other specified chronic obstructive pulmonary disease: Secondary | ICD-10-CM | POA: Diagnosis present

## 2024-01-05 DIAGNOSIS — Z1152 Encounter for screening for COVID-19: Secondary | ICD-10-CM

## 2024-01-05 DIAGNOSIS — Z885 Allergy status to narcotic agent status: Secondary | ICD-10-CM

## 2024-01-05 HISTORY — DX: Other forms of acute ischemic heart disease: I24.89

## 2024-01-05 LAB — CBC
HCT: 33.9 % — ABNORMAL LOW (ref 39.0–52.0)
Hemoglobin: 11.5 g/dL — ABNORMAL LOW (ref 13.0–17.0)
MCH: 31.6 pg (ref 26.0–34.0)
MCHC: 33.9 g/dL (ref 30.0–36.0)
MCV: 93.1 fL (ref 80.0–100.0)
Platelets: 281 10*3/uL (ref 150–400)
RBC: 3.64 MIL/uL — ABNORMAL LOW (ref 4.22–5.81)
RDW: 12.9 % (ref 11.5–15.5)
WBC: 10.2 10*3/uL (ref 4.0–10.5)
nRBC: 0 % (ref 0.0–0.2)

## 2024-01-05 LAB — BASIC METABOLIC PANEL
Anion gap: 18 — ABNORMAL HIGH (ref 5–15)
BUN: 85 mg/dL — ABNORMAL HIGH (ref 6–20)
CO2: 24 mmol/L (ref 22–32)
Calcium: 8.7 mg/dL — ABNORMAL LOW (ref 8.9–10.3)
Chloride: 99 mmol/L (ref 98–111)
Creatinine, Ser: 7.7 mg/dL — ABNORMAL HIGH (ref 0.61–1.24)
GFR, Estimated: 8 mL/min — ABNORMAL LOW (ref >60)
Glucose, Bld: 131 mg/dL — ABNORMAL HIGH (ref 70–99)
Potassium: 4.5 mmol/L (ref 3.5–5.1)
Sodium: 141 mmol/L (ref 135–145)

## 2024-01-05 LAB — TROPONIN I (HIGH SENSITIVITY): Troponin I (High Sensitivity): 167 ng/L (ref <18)

## 2024-01-05 MED ORDER — FENTANYL CITRATE PF 50 MCG/ML IJ SOSY: 50.0000 ug | PREFILLED_SYRINGE | Freq: Once | INTRAMUSCULAR | Status: DC
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
## 2024-01-06 ENCOUNTER — Emergency Department: Payer: Medicare Other

## 2024-01-06 ENCOUNTER — Encounter: Payer: Self-pay | Admitting: Family Medicine

## 2024-01-06 DIAGNOSIS — Z8249 Family history of ischemic heart disease and other diseases of the circulatory system: Secondary | ICD-10-CM | POA: Diagnosis not present

## 2024-01-06 DIAGNOSIS — I249 Acute ischemic heart disease, unspecified: Secondary | ICD-10-CM | POA: Diagnosis not present

## 2024-01-06 DIAGNOSIS — Z992 Dependence on renal dialysis: Secondary | ICD-10-CM | POA: Diagnosis not present

## 2024-01-06 DIAGNOSIS — Z7982 Long term (current) use of aspirin: Secondary | ICD-10-CM | POA: Diagnosis not present

## 2024-01-06 DIAGNOSIS — J9621 Acute and chronic respiratory failure with hypoxia: Secondary | ICD-10-CM | POA: Diagnosis present

## 2024-01-06 DIAGNOSIS — Z6832 Body mass index (BMI) 32.0-32.9, adult: Secondary | ICD-10-CM | POA: Diagnosis not present

## 2024-01-06 DIAGNOSIS — J439 Emphysema, unspecified: Secondary | ICD-10-CM | POA: Diagnosis present

## 2024-01-06 DIAGNOSIS — I132 Hypertensive heart and chronic kidney disease with heart failure and with stage 5 chronic kidney disease, or end stage renal disease: Secondary | ICD-10-CM | POA: Diagnosis present

## 2024-01-06 DIAGNOSIS — I2489 Other forms of acute ischemic heart disease: Secondary | ICD-10-CM

## 2024-01-06 DIAGNOSIS — G4733 Obstructive sleep apnea (adult) (pediatric): Secondary | ICD-10-CM | POA: Diagnosis present

## 2024-01-06 DIAGNOSIS — N186 End stage renal disease: Secondary | ICD-10-CM | POA: Diagnosis present

## 2024-01-06 DIAGNOSIS — I10 Essential (primary) hypertension: Secondary | ICD-10-CM | POA: Diagnosis not present

## 2024-01-06 DIAGNOSIS — N2581 Secondary hyperparathyroidism of renal origin: Secondary | ICD-10-CM | POA: Diagnosis present

## 2024-01-06 DIAGNOSIS — E785 Hyperlipidemia, unspecified: Secondary | ICD-10-CM

## 2024-01-06 DIAGNOSIS — Z79899 Other long term (current) drug therapy: Secondary | ICD-10-CM | POA: Diagnosis not present

## 2024-01-06 DIAGNOSIS — I160 Hypertensive urgency: Secondary | ICD-10-CM

## 2024-01-06 DIAGNOSIS — E1122 Type 2 diabetes mellitus with diabetic chronic kidney disease: Secondary | ICD-10-CM | POA: Diagnosis present

## 2024-01-06 DIAGNOSIS — J4489 Other specified chronic obstructive pulmonary disease: Secondary | ICD-10-CM | POA: Diagnosis present

## 2024-01-06 DIAGNOSIS — Z23 Encounter for immunization: Secondary | ICD-10-CM | POA: Diagnosis present

## 2024-01-06 DIAGNOSIS — I5032 Chronic diastolic (congestive) heart failure: Secondary | ICD-10-CM | POA: Diagnosis present

## 2024-01-06 DIAGNOSIS — I259 Chronic ischemic heart disease, unspecified: Secondary | ICD-10-CM | POA: Diagnosis not present

## 2024-01-06 DIAGNOSIS — R079 Chest pain, unspecified: Secondary | ICD-10-CM | POA: Diagnosis not present

## 2024-01-06 DIAGNOSIS — E871 Hypo-osmolality and hyponatremia: Secondary | ICD-10-CM | POA: Diagnosis present

## 2024-01-06 DIAGNOSIS — E872 Acidosis, unspecified: Secondary | ICD-10-CM | POA: Diagnosis present

## 2024-01-06 DIAGNOSIS — F17210 Nicotine dependence, cigarettes, uncomplicated: Secondary | ICD-10-CM | POA: Diagnosis present

## 2024-01-06 DIAGNOSIS — Z833 Family history of diabetes mellitus: Secondary | ICD-10-CM | POA: Diagnosis not present

## 2024-01-06 DIAGNOSIS — Z1152 Encounter for screening for COVID-19: Secondary | ICD-10-CM | POA: Diagnosis not present

## 2024-01-06 DIAGNOSIS — X088XXA Exposure to other specified smoke, fire and flames, initial encounter: Secondary | ICD-10-CM | POA: Diagnosis present

## 2024-01-06 DIAGNOSIS — I161 Hypertensive emergency: Secondary | ICD-10-CM | POA: Diagnosis not present

## 2024-01-06 DIAGNOSIS — R072 Precordial pain: Secondary | ICD-10-CM | POA: Diagnosis present

## 2024-01-06 DIAGNOSIS — D631 Anemia in chronic kidney disease: Secondary | ICD-10-CM | POA: Diagnosis present

## 2024-01-06 LAB — BASIC METABOLIC PANEL
Anion gap: 15 (ref 5–15)
BUN: 83 mg/dL — ABNORMAL HIGH (ref 6–20)
CO2: 23 mmol/L (ref 22–32)
Calcium: 8.5 mg/dL — ABNORMAL LOW (ref 8.9–10.3)
Chloride: 101 mmol/L (ref 98–111)
Creatinine, Ser: 7.74 mg/dL — ABNORMAL HIGH (ref 0.61–1.24)
GFR, Estimated: 7 mL/min — ABNORMAL LOW (ref >60)
Glucose, Bld: 113 mg/dL — ABNORMAL HIGH (ref 70–99)
Potassium: 3.9 mmol/L (ref 3.5–5.1)
Sodium: 139 mmol/L (ref 135–145)

## 2024-01-06 LAB — CBC
HCT: 33.7 % — ABNORMAL LOW (ref 39.0–52.0)
Hemoglobin: 11.6 g/dL — ABNORMAL LOW (ref 13.0–17.0)
MCH: 31.4 pg (ref 26.0–34.0)
MCHC: 34.4 g/dL (ref 30.0–36.0)
MCV: 91.3 fL (ref 80.0–100.0)
Platelets: 264 10*3/uL (ref 150–400)
RBC: 3.69 MIL/uL — ABNORMAL LOW (ref 4.22–5.81)
RDW: 13.1 % (ref 11.5–15.5)
WBC: 10.5 10*3/uL (ref 4.0–10.5)
nRBC: 0 % (ref 0.0–0.2)

## 2024-01-06 LAB — TROPONIN I (HIGH SENSITIVITY): Troponin I (High Sensitivity): 180 ng/L (ref <18)

## 2024-01-06 LAB — HEPARIN LEVEL (UNFRACTIONATED)
Heparin Unfractionated: <0.1 [IU]/mL — ABNORMAL LOW (ref 0.30–0.70)
Heparin Unfractionated: 0.32 [IU]/mL (ref 0.30–0.70)

## 2024-01-06 LAB — LIPID PANEL
Cholesterol: 180 mg/dL (ref 0–200)
HDL: 41 mg/dL (ref >40)
LDL Cholesterol: 113 mg/dL — ABNORMAL HIGH (ref 0–99)
Total CHOL/HDL Ratio: 4.4 {ratio}
Triglycerides: 130 mg/dL (ref <150)
VLDL: 26 mg/dL (ref 0–40)

## 2024-01-06 LAB — HEPATITIS B SURFACE ANTIGEN: Hepatitis B Surface Ag: NONREACTIVE

## 2024-01-06 MED ORDER — HEPARIN BOLUS VIA INFUSION
5000.0000 [IU] | Freq: Once | INTRAVENOUS | Status: AC
  Administered 2024-01-06: 5000 [IU] via INTRAVENOUS
  Filled 2024-01-06: qty 5000

## 2024-01-06 MED ORDER — HYDRALAZINE HCL 50 MG PO TABS
100.0000 mg | ORAL_TABLET | Freq: Three times a day (TID) | ORAL | Status: DC
  Administered 2024-01-06: 100 mg via ORAL
  Filled 2024-01-06: qty 2

## 2024-01-06 MED ORDER — NITROGLYCERIN 0.4 MG SL SUBL
0.4000 mg | SUBLINGUAL_TABLET | SUBLINGUAL | Status: DC | PRN
  Administered 2024-01-11: 0.4 mg via SUBLINGUAL
  Filled 2024-01-06: qty 1

## 2024-01-06 MED ORDER — ALPRAZOLAM 0.25 MG PO TABS
0.2500 mg | ORAL_TABLET | Freq: Two times a day (BID) | ORAL | Status: DC | PRN
  Administered 2024-01-09 – 2024-01-12 (×3): 0.25 mg via ORAL
  Filled 2024-01-06 (×3): qty 1

## 2024-01-06 MED ORDER — ASPIRIN 81 MG PO TBEC: 81.0000 mg | DELAYED_RELEASE_TABLET | Freq: Every day | ORAL | Status: DC

## 2024-01-06 MED ORDER — PANTOPRAZOLE SODIUM 40 MG PO TBEC
40.0000 mg | DELAYED_RELEASE_TABLET | Freq: Two times a day (BID) | ORAL | Status: DC
  Administered 2024-01-06 – 2024-01-15 (×17): 40 mg via ORAL
  Filled 2024-01-06 (×17): qty 1

## 2024-01-06 MED ORDER — ALBUTEROL SULFATE (2.5 MG/3ML) 0.083% IN NEBU
2.5000 mg | INHALATION_SOLUTION | Freq: Four times a day (QID) | RESPIRATORY_TRACT | Status: DC | PRN
  Administered 2024-01-07: 2.5 mg via RESPIRATORY_TRACT
  Filled 2024-01-06: qty 3

## 2024-01-06 MED ORDER — HEPARIN (PORCINE) 25000 UT/250ML-% IV SOLN
1600.0000 [IU]/h | INTRAVENOUS | Status: DC
  Administered 2024-01-06: 1600 [IU]/h via INTRAVENOUS
  Administered 2024-01-06: 1300 [IU]/h via INTRAVENOUS
  Administered 2024-01-07: 1600 [IU]/h via INTRAVENOUS
  Filled 2024-01-06 (×2): qty 250

## 2024-01-06 MED ORDER — LOSARTAN POTASSIUM 25 MG PO TABS
25.0000 mg | ORAL_TABLET | Freq: Every day | ORAL | Status: DC
  Administered 2024-01-06 – 2024-01-15 (×8): 25 mg via ORAL
  Filled 2024-01-06 (×9): qty 1

## 2024-01-06 MED ORDER — TORSEMIDE 10 MG PO TABS
5.0000 mg | ORAL_TABLET | Freq: Every day | ORAL | Status: DC
  Administered 2024-01-06 – 2024-01-15 (×10): 5 mg via ORAL
  Filled 2024-01-06 (×13): qty 0.5

## 2024-01-06 MED ORDER — HEPARIN BOLUS VIA INFUSION
3000.0000 [IU] | Freq: Once | INTRAVENOUS | Status: AC
  Administered 2024-01-06: 3000 [IU] via INTRAVENOUS
  Filled 2024-01-06: qty 3000

## 2024-01-06 MED ORDER — ASPIRIN 300 MG RE SUPP: 300.0000 mg | RECTAL | Status: AC

## 2024-01-06 MED ORDER — CARVEDILOL 6.25 MG PO TABS
6.2500 mg | ORAL_TABLET | Freq: Two times a day (BID) | ORAL | Status: DC
  Administered 2024-01-06 – 2024-01-07 (×2): 6.25 mg via ORAL
  Filled 2024-01-06 (×2): qty 1

## 2024-01-06 MED ORDER — ACETAMINOPHEN 325 MG PO TABS
ORAL_TABLET | ORAL | Status: AC
  Filled 2024-01-06: qty 2

## 2024-01-06 MED ORDER — EZETIMIBE 10 MG PO TABS
10.0000 mg | ORAL_TABLET | Freq: Every day | ORAL | Status: DC
  Administered 2024-01-06 – 2024-01-15 (×10): 10 mg via ORAL
  Filled 2024-01-06 (×9): qty 1

## 2024-01-06 MED ORDER — HYDRALAZINE HCL 20 MG/ML IJ SOLN
10.0000 mg | Freq: Four times a day (QID) | INTRAMUSCULAR | Status: DC | PRN
  Administered 2024-01-06: 10 mg via INTRAVENOUS
  Filled 2024-01-06: qty 1

## 2024-01-06 MED ORDER — LABETALOL HCL 5 MG/ML IV SOLN
20.0000 mg | INTRAVENOUS | Status: DC | PRN
  Administered 2024-01-06 (×2): 20 mg via INTRAVENOUS
  Filled 2024-01-06 (×2): qty 4

## 2024-01-06 MED ORDER — NICOTINE 14 MG/24HR TD PT24
14.0000 mg | MEDICATED_PATCH | Freq: Every day | TRANSDERMAL | Status: DC
  Administered 2024-01-06 – 2024-01-15 (×10): 14 mg via TRANSDERMAL
  Filled 2024-01-06 (×10): qty 1

## 2024-01-06 MED ORDER — ACETAMINOPHEN 325 MG PO TABS
650.0000 mg | ORAL_TABLET | ORAL | Status: DC | PRN
  Administered 2024-01-06 – 2024-01-12 (×14): 650 mg via ORAL
  Filled 2024-01-06 (×12): qty 2

## 2024-01-06 MED ORDER — ASPIRIN 81 MG PO CHEW
324.0000 mg | CHEWABLE_TABLET | ORAL | Status: AC
  Administered 2024-01-06: 324 mg via ORAL

## 2024-01-06 MED ORDER — TETANUS-DIPHTH-ACELL PERTUSSIS 5-2.5-18.5 LF-MCG/0.5 IM SUSY
0.5000 mL | PREFILLED_SYRINGE | Freq: Once | INTRAMUSCULAR | Status: AC
  Administered 2024-01-06: 0.5 mL via INTRAMUSCULAR
  Filled 2024-01-06: qty 0.5

## 2024-01-06 MED ORDER — AMLODIPINE BESYLATE 10 MG PO TABS
10.0000 mg | ORAL_TABLET | Freq: Every day | ORAL | Status: DC
  Administered 2024-01-06 – 2024-01-11 (×6): 10 mg via ORAL
  Filled 2024-01-06: qty 2
  Filled 2024-01-06 (×3): qty 1
  Filled 2024-01-06: qty 2

## 2024-01-06 MED ORDER — TRAZODONE HCL 50 MG PO TABS
25.0000 mg | ORAL_TABLET | Freq: Every evening | ORAL | Status: DC | PRN
  Administered 2024-01-09 – 2024-01-12 (×4): 25 mg via ORAL
  Filled 2024-01-06 (×4): qty 1

## 2024-01-06 MED ORDER — CHLORHEXIDINE GLUCONATE CLOTH 2 % EX PADS
6.0000 | MEDICATED_PAD | Freq: Every day | CUTANEOUS | Status: DC
  Administered 2024-01-09 – 2024-01-15 (×7): 6 via TOPICAL
  Filled 2024-01-06 (×3): qty 6

## 2024-01-06 MED ORDER — MAGNESIUM HYDROXIDE 400 MG/5ML PO SUSP: 30.0000 mL | Freq: Every day | ORAL | Status: DC | PRN

## 2024-01-06 MED ORDER — ONDANSETRON HCL 4 MG/2ML IJ SOLN
4.0000 mg | Freq: Four times a day (QID) | INTRAMUSCULAR | Status: DC | PRN
  Administered 2024-01-06: 4 mg via INTRAVENOUS
  Filled 2024-01-06 (×2): qty 2

## 2024-01-06 MED ORDER — ASPIRIN 81 MG PO TBEC
81.0000 mg | DELAYED_RELEASE_TABLET | Freq: Every day | ORAL | Status: DC
  Administered 2024-01-07 – 2024-01-15 (×9): 81 mg via ORAL
  Filled 2024-01-06 (×8): qty 1

## 2024-01-06 MED ORDER — NITROGLYCERIN 2 % TD OINT
1.0000 [in_us] | TOPICAL_OINTMENT | Freq: Four times a day (QID) | TRANSDERMAL | Status: DC
  Administered 2024-01-06 – 2024-01-07 (×3): 1 [in_us] via TOPICAL
  Filled 2024-01-06 (×3): qty 1

## 2024-01-06 MED ORDER — DULOXETINE HCL 30 MG PO CPEP
60.0000 mg | ORAL_CAPSULE | Freq: Every day | ORAL | Status: DC
  Administered 2024-01-06 – 2024-01-15 (×10): 60 mg via ORAL
  Filled 2024-01-06: qty 1
  Filled 2024-01-06 (×7): qty 2
  Filled 2024-01-06: qty 1

## 2024-01-06 MED ORDER — HEPARIN SODIUM (PORCINE) 5000 UNIT/ML IJ SOLN: 60.0000 [IU]/kg | Freq: Once | INTRAMUSCULAR | Status: DC

## 2024-01-06 MED ORDER — ASPIRIN 81 MG PO CHEW
324.0000 mg | CHEWABLE_TABLET | Freq: Once | ORAL | Status: DC
Start: 2024-01-06 — End: 2024-01-06
  Filled 2024-01-06: qty 4

## 2024-01-06 MED ORDER — BACITRACIN ZINC 500 UNIT/GM EX OINT
TOPICAL_OINTMENT | Freq: Once | CUTANEOUS | Status: AC
  Administered 2024-01-06: 1 via TOPICAL
  Filled 2024-01-06: qty 0.9

## 2024-01-06 NOTE — Hospital Course (Signed)
Afebrile and hypertensive @ 213/113. ECG showed RSR @ 93 w/ LAD, LVH, and anteroseptal infarct (no acute changes).  Labs notable for BUN/Creat 85/7.70, H/H 11.5/33.9, hsTrop 167  180.  CXR w/o active dzs.

## 2024-01-06 NOTE — Assessment & Plan Note (Signed)
-   He will be placed on as needed IV labetalol and hydralazine. - We will add Nitropaste. - We will continue antihypertensive therapy.

## 2024-01-06 NOTE — ED Notes (Signed)
Pt transported to dialysis escorted by Colin Mulders RN

## 2024-01-06 NOTE — Assessment & Plan Note (Signed)
-   The patient will be placed on supplement coverage with NovoLog.

## 2024-01-06 NOTE — Assessment & Plan Note (Signed)
-   Nephrology consult to be obtained. - I notified Dr. Thedore Mins about the patient.
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
## 2024-01-06 NOTE — Progress Notes (Signed)
PHARMACY - ANTICOAGULATION CONSULT NOTE  Pharmacy Consult for Heparin  Indication: chest pain/ACS  Allergies  Allergen Reactions   Codeine Hives and Other (See Comments)   Atorvastatin     Joint Aches - Severe Joint Aches - Severe Joint Aches - Severe   Avelox [Moxifloxacin Hcl In Nacl]     Muscle pain   Buprenorphine     Mouth sores, confusion, shaking   Dilaudid [Hydromorphone Hcl] Hives   Fluoxetine Itching   Levofloxacin Other (See Comments)    Joint Pain   Morphine    Other     Muscle pain   Suboxone [Buprenorphine Hcl-Naloxone Hcl] Other (See Comments)    Rash and confused   Vancomycin     Renal insufficiency    Patient Measurements: Height: 5\' 7"  (170.2 cm) Weight: 95.7 kg (210 lb 15.7 oz) IBW/kg (Calculated) : 66.1 Heparin Dosing Weight: 95.7 kg   Vital Signs: Temp: 98 F (36.7 C) (01/26 2210) Temp Source: Oral (01/26 2210) BP: 213/113 (01/26 2210) Pulse Rate: 95 (01/26 2210)  Labs: Recent Labs    01/05/24 2251 01/06/24 0009  HGB 11.5*  --   HCT 33.9*  --   PLT 281  --   CREATININE 7.70*  --   TROPONINIHS 167* 180*    Estimated Creatinine Clearance: 11.5 mL/min (A) (by C-G formula based on SCr of 7.7 mg/dL (H)).   Medical History: Past Medical History:  Diagnosis Date   Abdominal abscess    a.) chronic MRSA infection   Acute pancreatitis    Anxiety    Asthma    Brain aneurysm    a.) 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 b.) RIGHT crainotomy for clip ligation 05/14/2019.   CHF (congestive heart failure) (HCC)    Chronic back pain    Chronic heart failure with preserved ejection fraction (HFpEF) (HCC)    a. 11/2021 Echo: EF 55-60%, no rwma, mod LVH, GrI DD. Nl RV size/fxn. Mildly dil LA.   CKD (chronic kidney disease), stage V (HCC)    Depression    Emphysema of lung (HCC)    Erectile dysfunction    Followed by palliative care service    GERD (gastroesophageal  reflux disease)    History of methicillin resistant staphylococcus aureus (MRSA)    HLD (hyperlipidemia)    Hypertension    Mild cognitive impairment    MRSA (methicillin resistant Staphylococcus aureus)    Obesity    OSA (obstructive sleep apnea) 2013   a.) not compliant with nocturnal PAP therapy; CPAP machine "lost or stolen".   Panic disorder    Perforated bowel (HCC) 12/10/2005   Tempoary Colostomy Bag, Skin Graft for Abd wound   Polysubstance abuse (HCC)    a.) ETOH, tobacco, marijuana, methamphetamines, cocaine, BZO, opioids.   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   T2DM (type 2 diabetes mellitus) (HCC)    Tobacco abuse    Type 2 diabetes mellitus without complication, without long-term current use of insulin (HCC) 04/17/2016   Vitreous hemorrhage (HCC) 03/27/2011   Overview:  Bilateral; [facility]/2010, [facility]     Medications:  (Not in a hospital admission)   Assessment: Pharmacy consulted to dose heparin in this 58 year old male admitted with ACS/NSTEMI.  No prior anticoag noted.  CrCl = 11.5 ml/min   Goal of Therapy:  Heparin level 0.3-0.7  units/ml Monitor platelets by anticoagulation protocol: Yes   Plan:  Give 5000 units bolus x 1 Start heparin infusion at 1300 units/hr Check anti-Xa level in 8 hours and daily while on heparin Continue to monitor H&H and platelets  Huel Centola D 01/06/2024,2:15 AM

## 2024-01-06 NOTE — ED Notes (Signed)
Lab contacted to collect remaining 0500 labs

## 2024-01-06 NOTE — Assessment & Plan Note (Signed)
-   We will continue his Zetia and add statin therapy
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
## 2024-01-06 NOTE — Assessment & Plan Note (Addendum)
-   She has chest pain with dyspnea and elevated troponin I. - While this elevated troponin could be demand ischemia with his end-stage renal disease, we will need to rule out non-STEMI. - He will be admitted to a progressive unit bed. - We will follow serial troponins. - We will place him on aspirin and as needed sublingual nitroglycerin. - Hypertensive urgency could be contributing to his pain. - Cardiology consult will be obtained. - I notified Dr. Melton Alar about the patient - We will hold off long-acting beta agonist.

## 2024-01-06 NOTE — ED Notes (Signed)
Pt in room on continuous monitoring but is refusing to keep devices on. This RN has replaced leads for telemetry, bp cuffs, and pulse ox multiple times and patient is still not wearing them consistently. Pt educated on the need to leave them in place especially with chief complaint and currently having a heparin drip infusing. At this time, patient verbalizes understanding, however his compliance is in question. This RN will attempt to monitor patient with provided patient cooperation and will notify provider of any changes if they occur.

## 2024-01-06 NOTE — Discharge Planning (Signed)
ESTABLISHED HEMODIALYSIS Outpatient Facility [facility]  [address] [place], [place] [address] [phone]  Schedule: TTS 10:45am  Dimas Chyle Dialysis Coordinator II  [facility] Cell: [phone] eFax: [phone] [email or web address]
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
## 2024-01-06 NOTE — ED Notes (Signed)
This RN attempted to get 0500 labs. Pt thrashing around in bed "trying to get comfortable" and this RN was having a hard time getting patient to remain still during lab draw. With this, all labs except CBC were drawn at this time. This RN will report need for this lab in handoff report.
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
## 2024-01-06 NOTE — Assessment & Plan Note (Signed)
-   We will place him on NicoDerm CQ patch. - I counseled him for smoking cessation and he will receive further counseling here.
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
## 2024-01-06 NOTE — ED Notes (Addendum)
Provider Notification  Date/Time of Notification: 01/04/23 @0250   Assessment/Test Notified: Pt blood pressure of 265/142  Provider Notified: Dr. Arville Care MD  Method of Communication: secure chat message  Provider's Response/Orders: Labetalol 20mg  every 6 hrs PRN; Hydralazine 10mg  every 6hrs PRN  Per orders, this RN administered Labetolol for hypertension management of patient.

## 2024-01-06 NOTE — ED Notes (Signed)
Pt given breakfast tray

## 2024-01-06 NOTE — Progress Notes (Signed)
PHARMACY - ANTICOAGULATION CONSULT NOTE  Pharmacy Consult for Heparin  Indication: chest pain/ACS  Allergies  Allergen Reactions   Codeine Hives and Other (See Comments)   Atorvastatin     Joint Aches - Severe Joint Aches - Severe Joint Aches - Severe   Avelox [Moxifloxacin Hcl In Nacl]     Muscle pain   Buprenorphine     Mouth sores, confusion, shaking   Dilaudid [Hydromorphone Hcl] Hives   Fluoxetine Itching   Levofloxacin Other (See Comments)    Joint Pain   Morphine    Other     Muscle pain   Suboxone [Buprenorphine Hcl-Naloxone Hcl] Other (See Comments)    Rash and confused   Vancomycin     Renal insufficiency    Patient Measurements: Height: 5\' 7"  (170.2 cm) Weight: 95.7 kg (210 lb 15.7 oz) IBW/kg (Calculated) : 66.1 Heparin Dosing Weight: 95.7 kg   Vital Signs: Temp: 98.6 F (37 C) (01/27 1851) Temp Source: Oral (01/27 1851) BP: 160/108 (01/27 2100) Pulse Rate: 74 (01/27 2100)  Labs: Recent Labs    01/05/24 2251 01/06/24 0009 01/06/24 0640 01/06/24 0819 01/06/24 1030 01/06/24 2140  HGB 11.5*  --   --  11.6*  --   --   HCT 33.9*  --   --  33.7*  --   --   PLT 281  --   --  264  --   --   HEPARINUNFRC  --   --   --   --  <0.10* 0.32  CREATININE 7.70*  --  7.74*  --   --   --   TROPONINIHS 167* 180*  --   --   --   --     Estimated Creatinine Clearance: 11.5 mL/min (A) (by C-G formula based on SCr of 7.74 mg/dL (H)).   Medical History: Past Medical History:  Diagnosis Date   Abdominal abscess    a.) chronic MRSA infection   Acute pancreatitis    Anxiety    Asthma    Brain aneurysm    a.) 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 b.) RIGHT crainotomy for clip ligation 05/14/2019.   Chronic back pain    Chronic heart failure with preserved ejection fraction (HFpEF) (HCC)    a. 11/2021 Echo: EF 55-60%, GrI DD; b. 06/2022 Echo: 60-65%, no rwma, mild LVH; c. 09/2023 Echo: EF  60-65%, no rwma, GrII DD, nl RV fxn, mildly dil LA, triv MR.   CKD (chronic kidney disease), stage V (HCC)    Demand ischemia (HCC)    a. 06/2022 MV: No ischemia/scar.  Mild cor Ca2+.   Depression    Emphysema of lung (HCC)    Erectile dysfunction    Followed by palliative care service    GERD (gastroesophageal reflux disease)    History of methicillin resistant staphylococcus aureus (MRSA)    HLD (hyperlipidemia)    Hypertension    Mild cognitive impairment    MRSA (methicillin resistant Staphylococcus aureus)    Obesity    OSA (obstructive sleep apnea) 2013   a.) not compliant with nocturnal PAP therapy; CPAP machine "lost or stolen".   Panic disorder    Perforated bowel (HCC) 12/10/2005   Tempoary Colostomy Bag, Skin Graft for Abd wound   Polysubstance abuse (HCC)    a.) ETOH, tobacco, marijuana, methamphetamines, cocaine, BZO, opioids.   Subarachnoid hemorrhage (HCC) 01/26/2010   a.) spontaneous rupture --> SAH from RIGHT PComm -->  coil embolization 01/26/2010 with known remaining neck remnant.   T2DM (type 2 diabetes mellitus) (HCC)    Tobacco abuse    Type 2 diabetes mellitus without complication, without long-term current use of insulin (HCC) 04/17/2016   Vitreous hemorrhage (HCC) 03/27/2011   Overview:  Bilateral; 01/2010, from [facility]     Medications:  (Not in a hospital admission)   Assessment: Pharmacy consulted to dose heparin in this 58 year old male admitted with ACS/NSTEMI.  No prior anticoag noted.  CrCl = 11.5 ml/min   Goal of Therapy:  Heparin level 0.3-0.7 units/ml Monitor platelets by anticoagulation protocol: Yes  Date/Time: HL: Comment/Rate: 1/27@1030  <0.10 Subtherapeutic@1300  units/hr(confirmed with nurse, no issues w/ infusing line)  1/27@2140        0.32    Therapeutic X 1    Plan:  1/27:  HL @ 2140 = 0.32, therapeutic X 1 - Will continue pt on current rate and recheck HL in 8 hrs on 1/28 @ 0500.  Continue to monitor H&H and  platelets  Cristy Colmenares D, PharmD Clinical Pharmacist 01/06/2024 10:28 PM
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
## 2024-01-06 NOTE — Progress Notes (Signed)
Received patient in bed to unit.  Alert and oriented.  Informed consent signed and in chart.   TX duration: 3 hours  Patient tolerated well.  Transported back to the room  Alert, without acute distress.  Hand-off given to patient's nurse.   Access used: Left lower forearm, AVG Access issues: none  Total UF removed: Medication(s) given: none  Post HD VS: Temp 97.6 BP 134/72 P75 resp 22 O2 Sat 92% 3lpm oxygen n/c Post HD weight: 93.8 kg  Freddie Breech, RN [facility]
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
## 2024-01-07 DIAGNOSIS — I161 Hypertensive emergency: Secondary | ICD-10-CM | POA: Diagnosis not present

## 2024-01-07 DIAGNOSIS — I2489 Other forms of acute ischemic heart disease: Secondary | ICD-10-CM | POA: Diagnosis not present

## 2024-01-07 LAB — CBC
HCT: 35.5 % — ABNORMAL LOW (ref 39.0–52.0)
Hemoglobin: 11.8 g/dL — ABNORMAL LOW (ref 13.0–17.0)
MCH: 30.8 pg (ref 26.0–34.0)
MCHC: 33.2 g/dL (ref 30.0–36.0)
MCV: 92.7 fL (ref 80.0–100.0)
Platelets: 265 10*3/uL (ref 150–400)
RBC: 3.83 MIL/uL — ABNORMAL LOW (ref 4.22–5.81)
RDW: 13.3 % (ref 11.5–15.5)
WBC: 10.6 10*3/uL — ABNORMAL HIGH (ref 4.0–10.5)
nRBC: 0 % (ref 0.0–0.2)

## 2024-01-07 LAB — HEPARIN LEVEL (UNFRACTIONATED): Heparin Unfractionated: 0.31 [IU]/mL (ref 0.30–0.70)

## 2024-01-07 LAB — LIPOPROTEIN A (LPA): Lipoprotein (a): <8.4 nmol/L (ref <75.0)

## 2024-01-07 MED ORDER — BACITRACIN ZINC 500 UNIT/GM EX OINT
TOPICAL_OINTMENT | Freq: Once | CUTANEOUS | Status: AC
  Administered 2024-01-07: 1 via TOPICAL
  Filled 2024-01-07: qty 0.9

## 2024-01-07 MED ORDER — CARVEDILOL 6.25 MG PO TABS
6.2500 mg | ORAL_TABLET | Freq: Two times a day (BID) | ORAL | Status: DC
  Administered 2024-01-07 – 2024-01-15 (×14): 6.25 mg via ORAL
  Filled 2024-01-07 (×14): qty 1

## 2024-01-07 MED ORDER — PENTAFLUOROPROP-TETRAFLUOROETH EX AERO
INHALATION_SPRAY | CUTANEOUS | Status: AC
  Filled 2024-01-07: qty 30

## 2024-01-07 NOTE — Progress Notes (Signed)
Received patient in bed to unit.  Alert and oriented.  Informed consent signed and in chart.   TX duration: 3.5 hours  Patient tolerated well.  Transported back to the room  Alert, without acute distress.  Hand-off given to patient's nurse: Meghan Notch  Access used: L forearm AVG Access issues: none  Total UF removed: Medication(s) given: none Post HD VS: T 98.4 BP 118/54 p77 resp 24 O2Sat 92% on 4lpm Oyxgen via N/C Post HD weight: 93.6kg  Freddie Breech, RN [facility]
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
## 2024-01-07 NOTE — ED Notes (Signed)
Called CCMD to place Pt on the monitor

## 2024-01-07 NOTE — ED Notes (Signed)
Transported this Pt to dialysis with monitor

## 2024-01-07 NOTE — ED Notes (Signed)
Morning meds just administered, Pt had just returned from dialysis back to ER
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
## 2024-01-07 NOTE — ED Notes (Signed)
Pts sats are 85% on 4L [place], which he came from dialysis on. Increased to 6L, sats increased to 88%. Gave Pt PRN breathing tx, sats improved to 93%, will wean Pt if possible after breathing tx is complete
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
## 2024-01-08 ENCOUNTER — Inpatient Hospital Stay (HOSPITAL_COMMUNITY)
Admit: 2024-01-08 | Discharge: 2024-01-08 | Disposition: A | Discharge status: Home / Self Care | Payer: Medicare Other | Attending: Nurse Practitioner | Admitting: Nurse Practitioner

## 2024-01-08 ENCOUNTER — Encounter: Payer: Self-pay | Admitting: Family Medicine

## 2024-01-08 DIAGNOSIS — I10 Essential (primary) hypertension: Secondary | ICD-10-CM | POA: Diagnosis not present

## 2024-01-08 DIAGNOSIS — R079 Chest pain, unspecified: Secondary | ICD-10-CM | POA: Diagnosis not present

## 2024-01-08 DIAGNOSIS — I259 Chronic ischemic heart disease, unspecified: Secondary | ICD-10-CM | POA: Diagnosis not present

## 2024-01-08 DIAGNOSIS — T2010XA Burn of first degree of head, face, and neck, unspecified site, initial encounter: Secondary | ICD-10-CM

## 2024-01-08 DIAGNOSIS — I2489 Other forms of acute ischemic heart disease: Secondary | ICD-10-CM | POA: Diagnosis not present

## 2024-01-08 DIAGNOSIS — E1122 Type 2 diabetes mellitus with diabetic chronic kidney disease: Secondary | ICD-10-CM

## 2024-01-08 DIAGNOSIS — I249 Acute ischemic heart disease, unspecified: Secondary | ICD-10-CM | POA: Diagnosis not present

## 2024-01-08 DIAGNOSIS — R0602 Shortness of breath: Principal | ICD-10-CM

## 2024-01-08 DIAGNOSIS — Z720 Tobacco use: Secondary | ICD-10-CM

## 2024-01-08 LAB — CBC
HCT: 35.6 % — ABNORMAL LOW (ref 39.0–52.0)
Hemoglobin: 11.8 g/dL — ABNORMAL LOW (ref 13.0–17.0)
MCH: 31.1 pg (ref 26.0–34.0)
MCHC: 33.1 g/dL (ref 30.0–36.0)
MCV: 93.9 fL (ref 80.0–100.0)
Platelets: 263 10*3/uL (ref 150–400)
RBC: 3.79 MIL/uL — ABNORMAL LOW (ref 4.22–5.81)
RDW: 13 % (ref 11.5–15.5)
WBC: 14.1 10*3/uL — ABNORMAL HIGH (ref 4.0–10.5)
nRBC: 0 % (ref 0.0–0.2)

## 2024-01-08 LAB — BASIC METABOLIC PANEL
Anion gap: 16 — ABNORMAL HIGH (ref 5–15)
BUN: 34 mg/dL — ABNORMAL HIGH (ref 6–20)
CO2: 26 mmol/L (ref 22–32)
Calcium: 8.6 mg/dL — ABNORMAL LOW (ref 8.9–10.3)
Chloride: 94 mmol/L — ABNORMAL LOW (ref 98–111)
Creatinine, Ser: 5.71 mg/dL — ABNORMAL HIGH (ref 0.61–1.24)
GFR, Estimated: 11 mL/min — ABNORMAL LOW (ref >60)
Glucose, Bld: 116 mg/dL — ABNORMAL HIGH (ref 70–99)
Potassium: 4.2 mmol/L (ref 3.5–5.1)
Sodium: 136 mmol/L (ref 135–145)

## 2024-01-08 LAB — ECHOCARDIOGRAM LIMITED
Calc EF: 55 %
Height: 67 in
S' Lateral: 2.8 cm
Single Plane A2C EF: 57.1 %
Single Plane A4C EF: 55.9 %
Weight: 3301.61 [oz_av]

## 2024-01-08 LAB — MRSA NEXT GEN BY PCR, NASAL: MRSA by PCR Next Gen: NOT DETECTED

## 2024-01-08 MED ORDER — ORAL CARE MOUTH RINSE: 15.0000 mL | OROMUCOSAL | Status: DC | PRN

## 2024-01-08 MED ORDER — HEPARIN SODIUM (PORCINE) 5000 UNIT/ML IJ SOLN
5000.0000 [IU] | Freq: Three times a day (TID) | INTRAMUSCULAR | Status: DC
  Administered 2024-01-08 – 2024-01-15 (×20): 5000 [IU] via SUBCUTANEOUS
  Filled 2024-01-08 (×20): qty 1

## 2024-01-08 MED ORDER — BACITRACIN ZINC 500 UNIT/GM EX OINT
TOPICAL_OINTMENT | Freq: Two times a day (BID) | CUTANEOUS | Status: DC
  Administered 2024-01-08 – 2024-01-15 (×14): 1 via TOPICAL
  Filled 2024-01-08 (×15): qty 0.9

## 2024-01-08 NOTE — Progress Notes (Signed)
Transition of Care [facility]) - Inpatient Brief Assessment   Patient Details  Name: Derek Cooley. MRN: [id number] Date of Birth: 10/13/1966  Transition of Care [facility]) CM/SW Contact:    Truddie Hidden, RN Phone Number: 01/08/2024, 11:57 AM   Clinical Narrative: TOC continuing to follow patient's progress throughout discharge planning.   Transition of Care Asessment: Insurance and Status: Insurance coverage has been reviewed Patient has primary care physician: Yes   Prior level of function:: Independent Prior/Current Home Services: No current home services Social Drivers of Health Review: SDOH reviewed no interventions necessary Readmission risk has been reviewed: Yes Transition of care needs: no transition of care needs at this time

## 2024-01-08 NOTE — ED Notes (Addendum)
This RN entered room to find pt covered in stool. This RN with assistance of tech helped clean up pt and entire bed area. New sheets and gown. Pt states he knows when he has to go to bathroom however did not realize he had a bowel movement. Pt seems confused when answering questions.
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
## 2024-01-08 NOTE — Progress Notes (Signed)
*  PRELIMINARY RESULTS* Echocardiogram A Limited 2D Echocardiogram has been performed.  Carolyne Fiscal 01/08/2024, 9:46 AM
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
## 2024-01-09 DIAGNOSIS — I249 Acute ischemic heart disease, unspecified: Secondary | ICD-10-CM | POA: Diagnosis not present

## 2024-01-09 LAB — CBC
HCT: 34.2 % — ABNORMAL LOW (ref 39.0–52.0)
Hemoglobin: 11.6 g/dL — ABNORMAL LOW (ref 13.0–17.0)
MCH: 31.6 pg (ref 26.0–34.0)
MCHC: 33.9 g/dL (ref 30.0–36.0)
MCV: 93.2 fL (ref 80.0–100.0)
Platelets: 265 10*3/uL (ref 150–400)
RBC: 3.67 MIL/uL — ABNORMAL LOW (ref 4.22–5.81)
RDW: 12.9 % (ref 11.5–15.5)
WBC: 9.4 10*3/uL (ref 4.0–10.5)
nRBC: 0 % (ref 0.0–0.2)

## 2024-01-09 LAB — BASIC METABOLIC PANEL
Anion gap: 13 (ref 5–15)
BUN: 53 mg/dL — ABNORMAL HIGH (ref 6–20)
CO2: 25 mmol/L (ref 22–32)
Calcium: 8.3 mg/dL — ABNORMAL LOW (ref 8.9–10.3)
Chloride: 96 mmol/L — ABNORMAL LOW (ref 98–111)
Creatinine, Ser: 8.24 mg/dL — ABNORMAL HIGH (ref 0.61–1.24)
GFR, Estimated: 7 mL/min — ABNORMAL LOW (ref >60)
Glucose, Bld: 121 mg/dL — ABNORMAL HIGH (ref 70–99)
Potassium: 3.9 mmol/L (ref 3.5–5.1)
Sodium: 134 mmol/L — ABNORMAL LOW (ref 135–145)

## 2024-01-09 LAB — MAGNESIUM: Magnesium: 2.2 mg/dL (ref 1.7–2.4)

## 2024-01-09 LAB — PHOSPHORUS: Phosphorus: 5.2 mg/dL — ABNORMAL HIGH (ref 2.5–4.6)

## 2024-01-09 MED ORDER — BACITRACIN ZINC 500 UNIT/GM EX OINT: TOPICAL_OINTMENT | Freq: Two times a day (BID) | CUTANEOUS | Status: AC

## 2024-01-09 MED ORDER — ACETAMINOPHEN 325 MG PO TABS
ORAL_TABLET | ORAL | Status: AC
  Filled 2024-01-09: qty 1

## 2024-01-09 MED ORDER — LIDOCAINE-PRILOCAINE 2.5-2.5 % EX CREA: 1.0000 | TOPICAL_CREAM | CUTANEOUS | Status: DC | PRN

## 2024-01-09 MED ORDER — HEPARIN SODIUM (PORCINE) 1000 UNIT/ML DIALYSIS: 1000.0000 [IU] | INTRAMUSCULAR | Status: DC | PRN

## 2024-01-09 MED ORDER — PENTAFLUOROPROP-TETRAFLUOROETH EX AERO: 1.0000 | INHALATION_SPRAY | CUTANEOUS | Status: DC | PRN

## 2024-01-09 MED ORDER — NICOTINE 21 MG/24HR TD PT24: 21.0000 mg | MEDICATED_PATCH | Freq: Every day | TRANSDERMAL | 0 refills | Status: DC

## 2024-01-09 MED ORDER — HYDRALAZINE HCL 50 MG PO TABS
50.0000 mg | ORAL_TABLET | Freq: Three times a day (TID) | ORAL | Status: DC
  Administered 2024-01-09 – 2024-01-11 (×5): 50 mg via ORAL
  Filled 2024-01-09 (×7): qty 1

## 2024-01-09 MED ORDER — HEPARIN SODIUM (PORCINE) 1000 UNIT/ML IJ SOLN
INTRAMUSCULAR | Status: AC
  Filled 2024-01-09: qty 10

## 2024-01-09 MED ORDER — HYDRALAZINE HCL 100 MG PO TABS: 50.0000 mg | ORAL_TABLET | Freq: Three times a day (TID) | ORAL | 2 refills | Status: DC

## 2024-01-09 NOTE — Discharge Instructions (Signed)
Food Resources  Agency Name: [facility] Address: [address] [place], [place] [address] Phone: [phone] Website: [email or web address] Service(s) Offered: Housing services, self-sufficiency, congregate meal program, weatherization program, Event organiser program, emergency food assistance,  housing counseling, home ownership program, wheels - to work program.  [employer] free for 60 and older at various locations from [employer], Monday-Friday:  [employer], [address] [place], [phone] -[facility], [address], [place] [phone]  -[facility], [address], [place] [phone]  -[address] [place], [phone]  Agency Name: [facility] Address: [phone], [place], [place] [address] Phone: [phone] Website: [email or web address] Service(s) Offered: Home delivered hot, frozen, and emergency  meals. Grocery assistance program which matches  volunteers one-on-one with seniors unable to grocery shop  for themselves. Must be 60 years and older; less than 20  hours of in-home aide service, limited or no driving ability;  live alone or with someone with a disability; live in  [place].  Agency Name: [facility] [employer]) Address: [address], [place], [place] [address] Phone: [phone] Service(s) Offered: Food is served to shut-ins, homeless, elderly, and low income people in the community every Saturday (11:30 am-12:30 pm) and Sunday (12:30 pm-1:30pm). Volunteers also offer help and encouragement in seeking employment,  and spiritual guidance.  Agency Name: [employer] Address: [address] [place], [place] [address] Phone: [phone] Service(s) Offered: Child support services; child welfare services; food stamps; Medicaid; work first family assistance; and aid  with fuel,  rent, food and medicine.  Agency Name: [employer] Address: [address] [place], [place] Phone: [phone] Website: [email or web address] Services Offered: Monday 10:00am-12:00, 8:00pm-9:00pm, and Friday 10:00am-12:00.  Agency Name: [employer] Address: [address], [place], [place] [address] Phone: [phone] Website: [email or web address] Service(s) Offered: Serves weekday meals, open from 11:30 am- 1:00 pm., and 6:30-7:30pm, Monday-Wednesday-Friday distributes food 3:30-6pm, Monday-Wednesday-Friday.  Agency Name: [facility] Address: [address] [place], [place] Phone: [phone] Website: [email or web address] Services Offered: Distributes food the 4th Saturday of the month, starting at 8:00 am  Agency Name: [facility] Address: [phone] [address] [place], [place] [address] Phone: [phone] Website: [email or web address] Service(s) Offered: Bread of life, weekly food pantry. Open Wednesdays from 10:00am-noon.  Agency Name: [facility] [employer] Address: [address] [place], [place], [place] Phone: [phone] Services Offered: Distributes food 9am-1pm, Monday-Thursday. Call for details.  Agency Name: [facility] [facility] Address: [address], [place], [place] [address] Phone: [phone] Website: [email or web address] Service(s) Offered: Games developer. Call for assistance.  Agency Name: [facility] Address: [address] [place], [place] [address] Phone: [phone] Service Offered: Emergency Food Pantry. Call for appointment.  Agency Name: [facility] Address: [address] [place], [place] [address] Phone: [phone] Website: [email or web address] Services Offered: Games developer. Call for details  Agency Name: [facility] Address: [address] [place], [place] Phone:  [phone] Website: [email or web address] Service(s) Offered: Emergency Food Pantry. Call for details.  Agency Name: [employer] Address: [address] [place], [place] [address] Phone: [phone] or [phone] Website: [email or web address] Service(s) Offered: Distribute food 9am-11:30 am, Tuesday-Friday, and 1-3:30pm, Monday-Friday. Food pantry Monday-Friday 1pm-3pm, fresh items, Mon.-Wed.-Fri.  Agency Name: [facility] Address: [address] [place], [place] [address] Phone: [phone] Website: [email or web address] Services Offered: Distribute food Tues and Sats from 9:00am-noon.  Closed 1st Saturday of each month. Call for details  Agency Name: [employer] Address: [address] [facility] [address], [place] [address] Phone: [phone]  Services Offered: Delivers meals every Thursday    the [facility] [employer] offers a [employer] that anyone can call toll free at [phone]. The friendship line is available 24 hours a day  [employer] is a Program of All-inclusive Care for the Elderly (PACE). Their mission is to promote and sustain the independence of seniors wishing to remain in the community. They provide seniors with comprehensive long-term health, social, medical and dietary care. Their program is a safe alternative to nursing home care. [phone]  [facility] Physical Address [facility] [address] [place], [place] [address] Phone: [phone]. . Online zoom yoga class, connect with others without leaving your home [facility] offers [place] dance cardio sessions for individuals via Zoom. This program provides: - Dance fitness activities Please contact program for more information. Servinganyone in need adults 18+ hiv/aids individuals families Call [phone]  Email [email or web address] to get more info  [employer] offers an online [employer] to individuals where they can receive help to focus on their best health. Whether you're a [employer] member or not, the neighborhood center offers a... Main Serviceshealth education  exercise & fitness  community support services  recreation  virtual support Other Servicessupport groups Servinganyone in need adults young adults teens seniors individuals families [email or web address] to get more info  Schedule on their website  [facility] offers an array of activities for adults age 65 and over. This program provides:- Fitness and health programs- Tech classes- Activity books Main Serviceshealth education  community support services  exercise & fitness  recreation  more education Servingseniors  Call [phone]    For more resources go online to [email or web address] and type in you zipcode
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
## 2024-01-09 NOTE — TOC Transition Note (Addendum)
Transition of Care [facility]) - Discharge Note   Patient Details  Name: Derek Cooley. MRN: [id number] Date of Birth: 1966-08-27  Transition of Care [facility]) CM/SW Contact:  Margarito Liner, LCSW Phone Number: 01/09/2024, 2:05 PM   Clinical Narrative: Patient has orders to discharge home today. Readmission prevention screen complete. CSW met with patient. No supports at bedside. CSW introduced role and explained that discharge planning would be discussed. PCP is Larae Grooms, NP. Son and daughter-in-law will transport him to appointments. Pharmacy is [facility] in [place]. No issues obtaining medications. Patient lives home with his to sons and their wives and kids. No home health prior to admission. He has home oxygen. He thinks it is provided by [employer]. CSW unable to reach [place] liaison to confirm. SDOH flags for food and social isolation. CSW added resources to AVS. No further concerns. Son will transport him home. CSW signing off.    3:08 pm: Patient gets his oxygen through [employer].  4:36 pm: Per RN, son said patient moved out of his previous negative living situation and cannot return to get his oxygen concentrator and tanks. [employer] liaison is aware and is reaching out to the office to see if they can get him a new one.  Final next level of care: Home/Self Care Barriers to Discharge: No Barriers Identified   Patient Goals and CMS Choice            Discharge Placement                Patient to be transferred to facility by: Son   Patient and family notified of of transfer: 01/09/24  Discharge Plan and Services Additional resources added to the After Visit Summary for                                       Social Drivers of Health (SDOH) Interventions SDOH Screenings   Food Insecurity: Food Insecurity Present (01/08/2024)  Housing: Low Risk  (01/08/2024)  Transportation Needs: No Transportation Needs (01/08/2024)  Utilities: Not At Risk (01/08/2024)  Alcohol  Screen: Low Risk  (06/25/2023)  Depression (PHQ2-9): Medium Risk (06/25/2023)  Financial Resource Strain: Low Risk  (06/25/2023)  Physical Activity: Inactive (06/25/2023)  Social Connections: Socially Isolated (01/08/2024)  Stress: No Stress Concern Present (06/25/2023)  Tobacco Use: High Risk (01/08/2024)  Health Literacy: Inadequate Health Literacy (06/25/2023)     Readmission Risk Interventions    01/09/2024    2:04 PM 06/25/2022    3:34 PM  Readmission Risk Prevention Plan  Transportation Screening Complete Complete  Medication Review (RN Care Manager) Complete Complete  PCP or Specialist appointment within 3-5 days of discharge Complete Complete  SW Recovery Care/Counseling Consult Complete Complete  Palliative Care Screening Not Applicable Not Applicable  Skilled Nursing Facility Not Applicable Not Applicable

## 2024-01-09 NOTE — Progress Notes (Signed)
Hemodialysis note  Received patient in bed to unit. Alert and oriented.  Informed consent signed and in chart.  Treatment initiated: 0815 Treatment completed: 1248  Patient tolerated well. Transported back to room, alert without acute distress.  Report given to patient's RN.   Access used: Left arm AVF Access issues: high venous pressure  Total UF removed: 2L Medication(s) given:  Tylenol 650 mg tab PO  Post HD weight: 83.6 kg (standing weight)   Wolfgang Phoenix [facility]ne [facility]

## 2024-01-10 LAB — CBC
HCT: 34.4 % — ABNORMAL LOW (ref 39.0–52.0)
Hemoglobin: 11.9 g/dL — ABNORMAL LOW (ref 13.0–17.0)
MCH: 31.3 pg (ref 26.0–34.0)
MCHC: 34.6 g/dL (ref 30.0–36.0)
MCV: 90.5 fL (ref 80.0–100.0)
Platelets: 282 10*3/uL (ref 150–400)
RBC: 3.8 MIL/uL — ABNORMAL LOW (ref 4.22–5.81)
RDW: 12.7 % (ref 11.5–15.5)
WBC: 11.2 10*3/uL — ABNORMAL HIGH (ref 4.0–10.5)
nRBC: 0 % (ref 0.0–0.2)

## 2024-01-10 LAB — BASIC METABOLIC PANEL
Anion gap: 11 (ref 5–15)
BUN: 35 mg/dL — ABNORMAL HIGH (ref 6–20)
CO2: 25 mmol/L (ref 22–32)
Calcium: 8.1 mg/dL — ABNORMAL LOW (ref 8.9–10.3)
Chloride: 94 mmol/L — ABNORMAL LOW (ref 98–111)
Creatinine, Ser: 6.16 mg/dL — ABNORMAL HIGH (ref 0.61–1.24)
GFR, Estimated: 10 mL/min — ABNORMAL LOW (ref >60)
Glucose, Bld: 74 mg/dL (ref 70–99)
Potassium: 3.7 mmol/L (ref 3.5–5.1)
Sodium: 130 mmol/L — ABNORMAL LOW (ref 135–145)

## 2024-01-10 LAB — MAGNESIUM: Magnesium: 2 mg/dL (ref 1.7–2.4)

## 2024-01-10 MED ORDER — TRAMADOL HCL 50 MG PO TABS
50.0000 mg | ORAL_TABLET | Freq: Four times a day (QID) | ORAL | Status: DC | PRN
  Administered 2024-01-10 – 2024-01-14 (×10): 50 mg via ORAL
  Filled 2024-01-10 (×10): qty 1
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
## 2024-01-10 NOTE — Care Management Important Message (Signed)
Important Message  Patient Details  Name: Derek Cooley. MRN: [id number] Date of Birth: [birth year]   Important Message Given:  Yes - Medicare IM     Cristela Blue, CMA 01/10/2024, 9:40 AM
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
## 2024-01-10 NOTE — Plan of Care (Signed)
   Problem: Health Behavior/Discharge Planning: Goal: Ability to manage health-related needs will improve Outcome: Progressing   Problem: Skin Integrity: Goal: Risk for impaired skin integrity will decrease Outcome: Progressing

## 2024-01-10 NOTE — Plan of Care (Signed)
  Problem: Health Behavior/Discharge Planning: Goal: Ability to manage health-related needs will improve 01/10/2024 1749 by Jiles Crocker, RN Outcome: Progressing 01/10/2024 1749 by Jiles Crocker, RN Outcome: Progressing   Problem: Skin Integrity: Goal: Risk for impaired skin integrity will decrease 01/10/2024 1749 by Jiles Crocker, RN Outcome: Progressing 01/10/2024 1749 by Jiles Crocker, RN Outcome: Progressing

## 2024-01-11 DIAGNOSIS — I249 Acute ischemic heart disease, unspecified: Secondary | ICD-10-CM | POA: Diagnosis not present

## 2024-01-11 LAB — RESPIRATORY PANEL BY PCR

## 2024-01-11 LAB — CBC
HCT: 32.4 % — ABNORMAL LOW (ref 39.0–52.0)
Hemoglobin: 10.9 g/dL — ABNORMAL LOW (ref 13.0–17.0)
MCH: 30.9 pg (ref 26.0–34.0)
MCHC: 33.6 g/dL (ref 30.0–36.0)
MCV: 91.8 fL (ref 80.0–100.0)
Platelets: 236 10*3/uL (ref 150–400)
RBC: 3.53 MIL/uL — ABNORMAL LOW (ref 4.22–5.81)
RDW: 13.1 % (ref 11.5–15.5)
WBC: 11.8 10*3/uL — ABNORMAL HIGH (ref 4.0–10.5)
nRBC: 0 % (ref 0.0–0.2)

## 2024-01-11 LAB — BASIC METABOLIC PANEL
Anion gap: 16 — ABNORMAL HIGH (ref 5–15)
BUN: 59 mg/dL — ABNORMAL HIGH (ref 6–20)
CO2: 22 mmol/L (ref 22–32)
Calcium: 8.6 mg/dL — ABNORMAL LOW (ref 8.9–10.3)
Chloride: 93 mmol/L — ABNORMAL LOW (ref 98–111)
Creatinine, Ser: 8.61 mg/dL — ABNORMAL HIGH (ref 0.61–1.24)
GFR, Estimated: 7 mL/min — ABNORMAL LOW (ref >60)
Glucose, Bld: 97 mg/dL (ref 70–99)
Potassium: 4.3 mmol/L (ref 3.5–5.1)
Sodium: 131 mmol/L — ABNORMAL LOW (ref 135–145)

## 2024-01-11 LAB — TROPONIN I (HIGH SENSITIVITY): Troponin I (High Sensitivity): 100 ng/L (ref <18)

## 2024-01-11 LAB — MAGNESIUM: Magnesium: 2 mg/dL (ref 1.7–2.4)

## 2024-01-11 LAB — SARS CORONAVIRUS 2 BY RT PCR: SARS Coronavirus 2 by RT PCR: NEGATIVE

## 2024-01-11 NOTE — Progress Notes (Signed)
This RN called the sister to follow up on his oxygen tank but still unable to pickup the oxygen from previous home.

## 2024-01-11 NOTE — Progress Notes (Signed)
Received patient in bed to unit.  Alert and oriented.  Informed consent signed and in chart.   TX duration: 3.5 hours  Patient tolerated well.  Transported back to the room  Alert, without acute distress.  Hand-off given to patient's nurse.   Access used: LUE AVG Access issues: none  Total UF removed: Medication(s) given: none Post HD VS: BP 174/99 P 62 resp 24 o2 Sat 95% 3 lpm Oxygen N/C Post HD weight not obtained  Freddie Breech, RN [facility] Unit
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
## 2024-01-11 NOTE — Plan of Care (Signed)
   Problem: Health Behavior/Discharge Planning: Goal: Ability to manage health-related needs will improve Outcome: Progressing   Problem: Skin Integrity: Goal: Risk for impaired skin integrity will decrease Outcome: Progressing
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
## 2024-01-12 DIAGNOSIS — I249 Acute ischemic heart disease, unspecified: Secondary | ICD-10-CM | POA: Diagnosis not present

## 2024-01-12 LAB — BASIC METABOLIC PANEL
Anion gap: 15 (ref 5–15)
BUN: 43 mg/dL — ABNORMAL HIGH (ref 6–20)
CO2: 23 mmol/L (ref 22–32)
Calcium: 8.8 mg/dL — ABNORMAL LOW (ref 8.9–10.3)
Chloride: 95 mmol/L — ABNORMAL LOW (ref 98–111)
Creatinine, Ser: 7.05 mg/dL — ABNORMAL HIGH (ref 0.61–1.24)
GFR, Estimated: 8 mL/min — ABNORMAL LOW (ref >60)
Glucose, Bld: 92 mg/dL (ref 70–99)
Potassium: 4.6 mmol/L (ref 3.5–5.1)
Sodium: 133 mmol/L — ABNORMAL LOW (ref 135–145)

## 2024-01-12 LAB — CBC
HCT: 35 % — ABNORMAL LOW (ref 39.0–52.0)
Hemoglobin: 11.6 g/dL — ABNORMAL LOW (ref 13.0–17.0)
MCH: 31 pg (ref 26.0–34.0)
MCHC: 33.1 g/dL (ref 30.0–36.0)
MCV: 93.6 fL (ref 80.0–100.0)
Platelets: 234 10*3/uL (ref 150–400)
RBC: 3.74 MIL/uL — ABNORMAL LOW (ref 4.22–5.81)
RDW: 13.3 % (ref 11.5–15.5)
WBC: 8.9 10*3/uL (ref 4.0–10.5)
nRBC: 0 % (ref 0.0–0.2)

## 2024-01-12 LAB — MAGNESIUM: Magnesium: 2 mg/dL (ref 1.7–2.4)

## 2024-01-12 MED ORDER — OSELTAMIVIR PHOSPHATE 30 MG PO CAPS
30.0000 mg | ORAL_CAPSULE | ORAL | Status: DC
  Administered 2024-01-14: 30 mg via ORAL
  Filled 2024-01-12: qty 1

## 2024-01-12 MED ORDER — OSELTAMIVIR PHOSPHATE 30 MG PO CAPS
30.0000 mg | ORAL_CAPSULE | Freq: Once | ORAL | Status: AC
  Administered 2024-01-12: 30 mg via ORAL
  Filled 2024-01-12: qty 1
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
## 2024-01-12 NOTE — Plan of Care (Signed)
   Problem: Health Behavior/Discharge Planning: Goal: Ability to manage health-related needs will improve Outcome: Progressing   Problem: Skin Integrity: Goal: Risk for impaired skin integrity will decrease Outcome: Progressing
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
## 2024-01-13 NOTE — Progress Notes (Signed)
 {  Select_[email or web address]_Note:[id number]}

## 2024-01-13 NOTE — TOC Transition Note (Signed)
Transition of Care [facility]) - Discharge Note   Patient Details  Name: Derek Cooley. MRN: [id number] Date of Birth: 09/13/1966  Transition of [facility]) CM/SW Contact:  Truddie Hidden, RN Phone Number: 01/13/2024, 1:05 PM   Clinical Narrative:    Patient is alert and orientedx 4. RNCM spoke with patiet regarding discharge plans. He as advised [employer] would provide him with 2 complimentary oxygen tanks that will last a total of approximately 6 hours. Each tank is estimated to last three hours. Patient will go pick up his oxygen tanks he was previously using from his previous address. He was advised there is a outstanding bill for his oxygen via [employer]. He is able to make arrangements for this amount He was advised a [employer] rep will reach out to him to discuss payment options. Patient stated someone from his family will transport him home today. RNCM offered to call. Patient stated he would call.   Mitch from [employer] advised patient was awaiting his call and is ready to disuss payment options.   TOC signing off.     Final next level of care: Home/Self Care Barriers to Discharge: No Barriers Identified   Patient Goals and CMS Choice            Discharge Placement                Patient to be transferred to facility by: Son   Patient and family notified of of transfer: 01/09/24  Discharge Plan and Services Additional resources added to the After Visit Summary for                                       Social Drivers of Health (SDOH) Interventions SDOH Screenings   Food Insecurity: Food Insecurity Present (01/08/2024)  Housing: Low Risk  (01/08/2024)  Transportation Needs: No Transportation Needs (01/08/2024)  Utilities: Not At Risk (01/08/2024)  Alcohol Screen: Low Risk  (06/25/2023)  Depression (PHQ2-9): Medium Risk (06/25/2023)  Financial Resource Strain: Low Risk  (06/25/2023)  Physical Activity: Inactive (06/25/2023)  Social Connections: Socially Isolated  (01/08/2024)  Stress: No Stress Concern Present (06/25/2023)  Tobacco Use: High Risk (01/08/2024)  Health Literacy: Inadequate Health Literacy (06/25/2023)     Readmission Risk Interventions    01/09/2024    2:04 PM 06/25/2022    3:34 PM  Readmission Risk Prevention Plan  Transportation Screening Complete Complete  Medication Review (RN Care Manager) Complete Complete  PCP or Specialist appointment within 3-5 days of discharge Complete Complete  SW Recovery Care/Counseling Consult Complete Complete  Palliative Care Screening Not Applicable Not Applicable  Skilled Nursing Facility Not Applicable Not Applicable

## 2024-01-13 NOTE — Plan of Care (Signed)
   Problem: Health Behavior/Discharge Planning: Goal: Ability to manage health-related needs will improve Outcome: Progressing   Problem: Skin Integrity: Goal: Risk for impaired skin integrity will decrease Outcome: Progressing

## 2024-01-14 DIAGNOSIS — I249 Acute ischemic heart disease, unspecified: Secondary | ICD-10-CM | POA: Diagnosis not present

## 2024-01-14 LAB — CBC
HCT: 30.9 % — ABNORMAL LOW (ref 39.0–52.0)
Hemoglobin: 10.1 g/dL — ABNORMAL LOW (ref 13.0–17.0)
MCH: 30.1 pg (ref 26.0–34.0)
MCHC: 32.7 g/dL (ref 30.0–36.0)
MCV: 92.2 fL (ref 80.0–100.0)
Platelets: 246 10*3/uL (ref 150–400)
RBC: 3.35 MIL/uL — ABNORMAL LOW (ref 4.22–5.81)
RDW: 13.4 % (ref 11.5–15.5)
WBC: 12.2 10*3/uL — ABNORMAL HIGH (ref 4.0–10.5)
nRBC: 0 % (ref 0.0–0.2)

## 2024-01-14 LAB — RENAL FUNCTION PANEL
Albumin: 2.9 g/dL — ABNORMAL LOW (ref 3.5–5.0)
Anion gap: 19 — ABNORMAL HIGH (ref 5–15)
BUN: 95 mg/dL — ABNORMAL HIGH (ref 6–20)
CO2: 20 mmol/L — ABNORMAL LOW (ref 22–32)
Calcium: 7.7 mg/dL — ABNORMAL LOW (ref 8.9–10.3)
Chloride: 89 mmol/L — ABNORMAL LOW (ref 98–111)
Creatinine, Ser: 11.89 mg/dL — ABNORMAL HIGH (ref 0.61–1.24)
GFR, Estimated: 4 mL/min — ABNORMAL LOW (ref >60)
Glucose, Bld: 88 mg/dL (ref 70–99)
Phosphorus: 9.4 mg/dL — ABNORMAL HIGH (ref 2.5–4.6)
Potassium: 4.9 mmol/L (ref 3.5–5.1)
Sodium: 128 mmol/L — ABNORMAL LOW (ref 135–145)

## 2024-01-14 MED ORDER — OSELTAMIVIR PHOSPHATE 30 MG PO CAPS: ORAL_CAPSULE | ORAL | 0 refills | Status: DC

## 2024-01-14 NOTE — Plan of Care (Signed)
   Problem: Health Behavior/Discharge Planning: Goal: Ability to manage health-related needs will improve Outcome: Progressing   Problem: Skin Integrity: Goal: Risk for impaired skin integrity will decrease Outcome: Progressing

## 2024-01-14 NOTE — TOC Progression Note (Signed)
 Transition of Care (TOC) - Progression Note    Patient Details  Name: Derek Cooley. MRN: [id number] Date of Birth: 19-Dec-1965  Transition of Care [facility]) CM/SW Contact  Lauraine [name], LCSW Phone Number: 01/14/2024, 11:15 AM  Clinical Narrative:  Patient did not discharge yesterday. CSW spoke to son. He wants MD to call him to let him know what kind of infection patient has and how long he will need to quarantine from his grandchildren since he has the flu. Son said patient's sister picked up the concentrator. Son is going to see if patient can stay with her for a few days until he's over the flu.   Expected Discharge Plan: Home/Self Care Barriers to Discharge: No Barriers Identified  Expected Discharge Plan and Services         Expected Discharge Date: 01/09/24                                     Social Determinants of Health (SDOH) Interventions SDOH Screenings   Food Insecurity: Food Insecurity Present (01/08/2024)  Housing: Low Risk  (01/08/2024)  Transportation Needs: No Transportation Needs (01/08/2024)  Utilities: Not At Risk (01/08/2024)  Alcohol Screen: Low Risk  (06/25/2023)  Depression (PHQ2-9): Medium Risk (06/25/2023)  Financial Resource Strain: Low Risk  (06/25/2023)  Physical Activity: Inactive (06/25/2023)  Social Connections: Socially Isolated (01/08/2024)  Stress: No Stress Concern Present (06/25/2023)  Tobacco Use: High Risk (01/08/2024)  Health Literacy: Inadequate Health Literacy (06/25/2023)    Readmission Risk Interventions    01/09/2024    2:04 PM 06/25/2022    3:34 PM  Readmission Risk Prevention Plan  Transportation Screening Complete Complete  Medication Review (RN Care Manager) Complete Complete  PCP or Specialist appointment within 3-5 days of discharge Complete Complete  SW Recovery Care/Counseling Consult Complete Complete  Palliative Care Screening Not Applicable Not Applicable  Skilled Nursing Facility Not Applicable Not Applicable

## 2024-01-14 NOTE — Progress Notes (Signed)
 Hemodialysis Note:  Received patient in bed to unit. Alert and oriented. Informed consent singed and in chart.  Treatment initiated: 0916 Treatment completed: 1227  Access used: Left Fistula Access issues: Rinse back 40 minutes early due to high venous pressure. HD NP notified.  Patient tolerated well. Transported back to room, alert without acute distress. Report given to patient's RN.  Total UF removed: 1.3 Liters Medications given: None  Post HD weight: 85.6 kg  [facility]

## 2024-01-14 NOTE — Progress Notes (Signed)
 Spoke with patient's sister Derek Cooley) who stated that the oxygen wasn't ready to be picked up until [tomorrow]. She sated the patien's son will pick up the oxygen and then pick up the patient tomorrow. This RN explained the need for patient to be discharged as he has been ready for discharge for a few days. Derek Cooley also expressed concerns about young kids living at the home the patient will be returning to and was worried the children would get the flu. This RN explained the use of masks, proper hand hygiene, cleaning supplies, and social distancing to prevent the spread of viruses within households.
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
## 2024-01-14 NOTE — TOC Transition Note (Signed)
 Transition of Care [facility]) - Discharge Note   Patient Details  Name: Derek Cooley. MRN: [id number] Date of Birth: [birth year]  Transition of Care [facility]) CM/SW Contact:  Lauraine [name], LCSW Phone Number: 01/14/2024, 1:55 PM   Clinical Narrative:  Patient has orders to discharge home today. MD spoke to son and stated he will come pick patient up. No further concerns. CSW signing off.   Final next level of care: Home/Self Care Barriers to Discharge: Barriers Resolved   Patient Goals and CMS Choice            Discharge Placement                Patient to be transferred to facility by: Son Name of family member notified: Izaih Kataoka Patient and family notified of of transfer: 01/14/24  Discharge Plan and Services Additional resources added to the After Visit Summary for                                       Social Drivers of Health (SDOH) Interventions SDOH Screenings   Food Insecurity: Food Insecurity Present (01/08/2024)  Housing: Low Risk  (01/08/2024)  Transportation Needs: No Transportation Needs (01/08/2024)  Utilities: Not At Risk (01/08/2024)  Alcohol Screen: Low Risk  (06/25/2023)  Depression (PHQ2-9): Medium Risk (06/25/2023)  Financial Resource Strain: Low Risk  (06/25/2023)  Physical Activity: Inactive (06/25/2023)  Social Connections: Socially Isolated (01/08/2024)  Stress: No Stress Concern Present (06/25/2023)  Tobacco Use: High Risk (01/08/2024)  Health Literacy: Inadequate Health Literacy (06/25/2023)     Readmission Risk Interventions    01/09/2024    2:04 PM 06/25/2022    3:34 PM  Readmission Risk Prevention Plan  Transportation Screening Complete Complete  Medication Review (RN Care Manager) Complete Complete  PCP or Specialist appointment within 3-5 days of discharge Complete Complete  SW Recovery Care/Counseling Consult Complete Complete  Palliative Care Screening Not Applicable Not Applicable  Skilled Nursing Facility Not  Applicable Not Applicable

## 2024-01-15 DIAGNOSIS — Z992 Dependence on renal dialysis: Secondary | ICD-10-CM | POA: Diagnosis not present

## 2024-01-15 DIAGNOSIS — E871 Hypo-osmolality and hyponatremia: Secondary | ICD-10-CM | POA: Insufficient documentation

## 2024-01-15 DIAGNOSIS — N186 End stage renal disease: Secondary | ICD-10-CM | POA: Diagnosis not present

## 2024-01-15 DIAGNOSIS — E663 Overweight: Secondary | ICD-10-CM | POA: Insufficient documentation

## 2024-01-15 DIAGNOSIS — E872 Acidosis, unspecified: Secondary | ICD-10-CM | POA: Insufficient documentation

## 2024-01-15 DIAGNOSIS — I2489 Other forms of acute ischemic heart disease: Secondary | ICD-10-CM | POA: Diagnosis not present

## 2024-01-15 DIAGNOSIS — E1122 Type 2 diabetes mellitus with diabetic chronic kidney disease: Secondary | ICD-10-CM | POA: Diagnosis not present

## 2024-01-15 MED ORDER — SODIUM BICARBONATE 650 MG PO TABS
650.0000 mg | ORAL_TABLET | Freq: Two times a day (BID) | ORAL | Status: DC
  Administered 2024-01-15: 650 mg via ORAL
  Filled 2024-01-15: qty 1
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
## 2024-01-15 NOTE — Progress Notes (Signed)
AVS reviewed and given to patient.   All questions answered.

## 2024-01-16 ENCOUNTER — Telehealth: Payer: Self-pay

## 2024-01-16 LAB — CULTURE, BLOOD (ROUTINE X 2)
Culture: NO GROWTH
Culture: NO GROWTH
Special Requests: ADEQUATE
Special Requests: ADEQUATE

## 2024-01-16 NOTE — Transitions of Care (Post Inpatient/ED Visit) (Signed)
   01/16/2024  Name: Derek Cooley. MRN: [id number] DOB: 1966/04/09  Today's TOC FU Call Status: Today's TOC FU Call Status:: Unsuccessful Call (1st Attempt) Unsuccessful Call (1st Attempt) Date: 01/16/24 Spoke with patients sister-[name]  , who stated he is living with his son, call pkaced to  son Rozelle   Attempted to reach the patient regarding the most recent Inpatient/ED visit.Patient was called in an Outreach attempt to offer VBCI  30-day TOC program. Pt is eligible for program due to potential risk for readmission and/or high utilization. Unfortunately, I was not able to speak with the patient in regards to recent hospital discharge   Left a HIPAA compliant phone message message for patient including VBCI CM contact information with request for a call back in regard to recent hospital discharge     Follow Up Plan: Additional outreach attempts will be made to reach the patient to complete the Transitions of Care (Post Inpatient/ED visit) call.  Bari Mayans , BSN, RN [facility]   VBCI-Population Health RN Care Manager Direct Dial [phone]  Fax: [phone] Website: [email or web address]

## 2024-01-17 ENCOUNTER — Telehealth: Payer: Self-pay

## 2024-01-17 NOTE — Telephone Encounter (Signed)
 This encounter was created in error - please disregard.

## 2024-01-17 NOTE — Transitions of Care (Post Inpatient/ED Visit) (Signed)
 01/17/2024  Name: Derek Cooley. MRN: [id number] DOB: 1966/01/08  Today's TOC FU Call Status: Today's TOC FU Call Status:: Successful TOC FU Call Completed Unsuccessful Call (1st Attempt) Date: 01/16/24 [facility] FU Call Complete Date: 01/17/24 Patient's Name and Date of Birth confirmed.  Transition Care Management Follow-up Telephone Call Date of Discharge: 01/15/24 [facility] Type of Discharge: Inpatient Admission Primary Inpatient Discharge Diagnosis:: ACS (acute coronary syndrome) How have you been since you were released from the hospital?: Same Any questions or concerns?: No  Items Reviewed: Did you receive and understand the discharge instructions provided?: Yes Medications obtained,verified, and reconciled?: No Medications Not Reviewed Reasons:: Other: (loving with son, he was driving during call requested I call spouse Called no ans Mailbox is full) Any new allergies since your discharge?: No Dietary orders reviewed?: Yes Type of Diet Ordered:: Reg Renal NHeart Healthy Do you have support at home?: Yes People in Home: child(ren), adult Name of Support/Comfort Primary Source: Son [place] [facility] [place] Sister Reany  Medications Reviewed Today: Medications Reviewed Today   Medications were not reviewed in this encounter     Home Care and Equipment/Supplies: Were Home Health Services Ordered?: No Any new equipment or medical supplies ordered?: No  Functional Questionnaire: Do you need assistance with bathing/showering or dressing?: No Do you need assistance with meal preparation?: No Do you need assistance with eating?: No Do you have difficulty maintaining continence: No Do you need assistance with getting out of bed/getting out of a chair/moving?: No Do you have difficulty managing or taking your medications?: Yes (DIL helps manage)  Follow up appointments reviewed: PCP Follow-up appointment confirmed?: Yes Date of PCP follow-up  appointment?: 02/06/24 Follow-up Provider: Darice Petty Specialist [facility] Follow-up appointment confirmed?: Yes Date of Specialist follow-up appointment?: 02/06/24 Follow-Up Specialty Provider:: Cardiology Do you need transportation to your follow-up appointment?: No (Family Transport) Do you understand care options if your condition(s) worsen?: Yes-patient verbalized understanding  SDOH Interventions Today    Flowsheet Row Most Recent Value  SDOH Interventions   Food Insecurity Interventions Intervention Not Indicated  Housing Interventions Intervention Not Indicated  Transportation Interventions Intervention Not Indicated, Patient Resources (Friends/Family)  Social Connections Interventions Other (Comment)  [Amb referral]     Patient is at high risk for readmission and/or has history of  high utilization  Discussed VBCI  TOC program and weekly calls to patient to assess condition/status, medication management  and provide support/education as indicated . Patient/ Caregiver voiced understanding and is  agreeable to 30 day program   Spoke with son Follow-up call scheduled for next week , he requested I call his wife Nidia to review medications and MD visits as he was driving. Was unable to speak with her or leave a message her voice mail was  full.   Routine follow-up and on-going assessment evaluation and education of disease processes, recommended interventions for both chronic and acute medical conditions , will occur during each weekly visit along with ongoing review of symptoms ,medication reviews and reconciliation. Any updates , inconsistencies, discrepancies or acute care concerns will be addressed and routed to the correct Practitioner if indicated   Based on current information and Insurance plan -Reviewed benefits available to patient, including details about eligibility options for care if any area of needs were identified.  Reviewed patients ability to access and / or  navigating the benefits system..Amb Referral made if indicted , refer to orders section of note for details   Please refer to Care  Plan for goals and interventions -Effectiveness of interventions, symptom management and outcomes will be evaluated  weekly during [facility] 30-day Program Outreach calls  . Any necessary  changes and updates to Care Plan will be completed episodically    Reviewed goals for care Patient verbalizes understanding of instructions and care plan provided. Patient was encouraged to make informed decisions about their care, actively participate in managing their health condition, and implement lifestyle changes as needed to promote independence and self-management of health care   Patient was encouraged to Contact PCP with any changes in baseline or  medication regimen,  changes in health status  /  well-being, safety concerns, including falls any questions or concerns regarding ongoing medical care, any difficulty obtaining or picking up prescriptions, any changes or worsening in condition- including  symptoms not relieved  with interventions    The patient has been provided with contact information for the care management team and has been advised to call with any health-related questions or concerns. Follow up as indicated with Care Team , or sooner should any new problems arise.     Bari Mayans , BSN, RN [facility]   VBCI-Population Health RN Care Manager Direct Dial [phone]  Fax: [phone] Website: [email or web address]

## 2024-01-21 ENCOUNTER — Other Ambulatory Visit: Payer: Self-pay

## 2024-01-21 NOTE — Patient Outreach (Signed)
Care Management  Transitions of Care Program Transitions of Care Post-discharge week 2   01/21/2024 Name: Derek Cooley. MRN: [id number] DOB: Aug 26, 1966  Subjective: Derek Cooley. is a 58 y.o. year old male who is a primary care patient of Larae Grooms, NP. The Care Management team Engaged with patient Engaged with patient by telephone to assess and address transitions of care needs.   Consent to Services:  Patient was given information about care management services, agreed to services, and gave verbal consent to participate.   Assessment:   Patient/ Caregiver  voices no new complaints or concerns  and has not developed/ reported any new medical issues / Dx or acute changes. - since last follow-up call for most recent  Hospital stay   1/26-2/5   / 2025 He has had a general decline Spoke with son and DIL. He had developed incontinence , he has lost weight He does continue with Dialysis t-Th-S. He has an evaluation with Hospice scheduled 01/22/24 They did not recall company ( set up by his sister). He has verbalized he does nit want to continue with Dialysis, He is ready to go. He is tired. Discussed Hospice support service, Dialysis, Incontinence products and additional care support at home, both pvt and with Hospice  Encouraged  SW support and realistic conversations with patient to ensure his requests and desires are met.  Medication reconciliation/ review completed based on most recent medication list in EHR; and in review of recent Provider follow-up appointments- confirmed patient obtained / is taking all newly prescribed medications as instructed and is aware of any changes to previous medications regimen including dosage adjustments. Patient self-Caregiver manages medications; denies questions/ concerns or barriers to medication adherence at this time  Patient / Caregiver educated on red flag s/s to watch for based on current discharge diagnosis and was encouraged to report,  any changes in baseline or  medication regimen,   or any new unmanaged side effects or symptoms not relieved with interventions  to PCP and / or the  [facility] Case Management team .          SDOH Interventions    Flowsheet Row Patient Outreach from 01/21/2024 in [place] POPULATION HEALTH DEPARTMENT Telephone from 01/17/2024 in [place] POPULATION HEALTH DEPARTMENT ED to Hosp-Admission (Discharged) from 01/05/2024 in [facility] REGIONAL CARDIAC MED PCU Telephone from 10/04/2023 in [place] POPULATION HEALTH DEPARTMENT Clinical Support from 06/25/2023 in [facility] Office Visit from 06/10/2023 in [facility]  SDOH Interventions        Food Insecurity Interventions -- Intervention Not Indicated Inpatient TOC, Other (Comment)  [Resources added to AVS.] Intervention Not Indicated Intervention Not Indicated --  Housing Interventions -- Intervention Not Indicated -- -- Intervention Not Indicated --  Transportation Interventions -- Intervention Not Indicated, Patient Resources Dietitian) -- Intervention Not Indicated Intervention Not Indicated, Patient Resources Dietitian) --  Utilities Interventions Intervention Not Indicated -- -- -- Intervention Not Indicated --  Alcohol Usage Interventions -- -- -- -- Intervention Not Indicated (Score <7) --  Depression Interventions/Treatment  -- -- -- -- Medication Medication, Currently on Treatment  Financial Strain Interventions -- -- -- -- Intervention Not Indicated --  Physical Activity Interventions -- -- -- -- Patient Declined --  Stress Interventions -- -- -- -- Intervention Not Indicated --  Social Connections Interventions -- Other (Comment)  [Amb referral] Inpatient TOC, Other (Comment)  [Resources added to AVS.] -- Intervention Not Indicated --  Health  Literacy Interventions -- -- -- -- Intervention Not Indicated --        Goals Addressed             This Visit's Progress    TOC Care  Plan       Current Barriers:  Medication management New medications Provider appointments PCP and Cardiology Nephrology- 2/12 Hospice evaluation  Care Coordination needs related to ADL IADL limitations, Social Isolation, and Depression  RNCM Clinical Goal(s):  Patient will work with the Care Management team over the next 30 days to address Transition of Care Barriers: Medication Management Support at home Provider appointments take all medications exactly as prescribed and will call provider for medication related questions as evidenced by no missed medication doses  attend all scheduled medical appointments: with PCP and Specialists as evidenced by no missed follow-up visits  work with Child psychotherapist to address  related to the management of Limited social support, Mental Health Concerns , and Social Isolation related to the management of ESRD and CKD   as evidenced by review of EMR and patient or Child psychotherapist report through collaboration with Medical illustrator, provider, and care team.   Interventions: Evaluation of current treatment plan related to  self management and patient's adherence to plan as established by provider  Transitions of Care:  Goal on track:  Yes. Doctor Visits  - discussed the importance of doctor visits Level of Care needs assessed with patient/caregiver, reviewed with patient/caregiver, options discussed with patient/caregiver, needs identified and provider notified, and education provided Troubleshoot use of DME in the home  Post discharge activity limitations prescribed by provider reviewed Reviewed Signs and symptoms of infection   Chronic Kidney Disease Interventions:  (Status:  Goal on track:  Yes.) Short Term Goal Reviewed prescribed diet Reg Heart Health Renal  Reviewed medications with patient and discussed importance of compliance    Reviewed scheduled/upcoming provider appointments including    Advised patient to discuss medicatiins ongoing Dialysis and  treatment plans  with provider    Discussed plans with patient for ongoing care management follow up and provided patient with direct contact information for care management team    Screening for signs and symptoms of depression related to chronic disease state      Discussed the impact of chronic kidney disease on daily life and mental health and acknowledged and normalized feelings of disempowerment, fear, and frustration    Assessed social determinant of health barriers    Engage patient in early, proactive and ongoing discussion about goals of care and what matters most to them    Support coping and stress management by recognizing current strategies and assist in developing new strategies such as mindfulness, journaling, relaxation techniques, problem-solving    Last practice recorded BP readings:  BP Readings from Last 3 Encounters:  01/15/24 127/80  12/01/23 (!) 144/77  11/05/23 (!) 185/86   Most recent eGFR/CrCl:  Lab Results  Component Value Date   EGFR 13 (L) 11/05/2023    No components found for: "CRCL"  Patient Goals/Self-Care Activities:   Follow Up Plan:  Telephone follow up appointment with care management team member scheduled for:  01/28/24 @ 10:00am  The patient has been provided with contact information for the care management team and has been advised to call with any health related questions or concerns.          Plan:  Routine follow-up and on-going assessment evaluation and education of disease processes, recommended interventions for both chronic and acute  medical conditions , will occur during each weekly visit along with ongoing review of symptoms ,medication reviews and reconciliation. Any updates , inconsistencies, discrepancies or acute care concerns will be addressed and routed to the correct Practitioner if indicated   Based on current information and Insurance plan -Reviewed benefits available to patient, including details about eligibility options for  care if any area of needs were identified.  Reviewed patients ability to access and / or navigating the benefits system..Amb Referral made if indicted , refer to orders section of note for details   Please refer to Care Plan for goals and interventions -Effectiveness of interventions, symptom management and outcomes will be evaluated  weekly during [facility] 30-day Program Outreach calls  . Any necessary  changes and updates to Care Plan will be completed episodically    Reviewed goals for care Patient verbalizes understanding of instructions and care plan provided. Patient was encouraged to make informed decisions about their care, actively participate in managing their health condition, and implement lifestyle changes as needed to promote independence and self-management of health care   Patient was encouraged to Contact PCP with any changes in baseline or  medication regimen,  changes in health status  /  well-being, safety concerns, including falls any questions or concerns regarding ongoing medical care, any difficulty obtaining or picking up prescriptions, any changes or worsening in condition- including  symptoms not relieved  with interventions    The patient has been provided with contact information for the care management team and has been advised to call with any health-related questions or concerns. Follow up as indicated with Care Team , or sooner should any new problems arise.     The patient has been provided with contact information for the care management team and has been advised to call with any health related questions or concerns.   Susa Loffler , BSN, RN [facility]   VBCI-Population Health RN Care Manager Direct Dial [phone]  Fax: [phone] Website: [email or web address]

## 2024-01-21 NOTE — Patient Instructions (Signed)
Visit Information  Thank you for taking time to visit with me today. Please don't hesitate to contact me if I can be of assistance to you before our next scheduled telephone appointment.  Our next appointment is by telephone on 01/28/24 at 10:00am  Following is a copy of your care plan:   Goals Addressed             This Visit's Progress    TOC Care Plan       Current Barriers:  Medication management New medications Provider appointments PCP and Cardiology Nephrology- 2/12 Hospice evaluation  Care Coordination needs related to ADL IADL limitations, Social Isolation, and Depression  RNCM Clinical Goal(s):  Patient will work with the Care Management team over the next 30 days to address Transition of Care Barriers: Medication Management Support at home Provider appointments take all medications exactly as prescribed and will call provider for medication related questions as evidenced by no missed medication doses  attend all scheduled medical appointments: with PCP and Specialists as evidenced by no missed follow-up visits  work with Child psychotherapist to address  related to the management of Limited social support, Mental Health Concerns , and Social Isolation related to the management of ESRD and CKD   as evidenced by review of EMR and patient or Child psychotherapist report through collaboration with Medical illustrator, provider, and care team.   Interventions: Evaluation of current treatment plan related to  self management and patient's adherence to plan as established by provider  Transitions of Care:  Goal on track:  Yes. Doctor Visits  - discussed the importance of doctor visits Level of Care needs assessed with patient/caregiver, reviewed with patient/caregiver, options discussed with patient/caregiver, needs identified and provider notified, and education provided Troubleshoot use of DME in the home  Post discharge activity limitations prescribed by provider reviewed Reviewed Signs and  symptoms of infection   Chronic Kidney Disease Interventions:  (Status:  Goal on track:  Yes.) Short Term Goal Reviewed prescribed diet Reg Heart Health Renal  Reviewed medications with patient and discussed importance of compliance    Reviewed scheduled/upcoming provider appointments including    Advised patient to discuss medicatiins ongoing Dialysis and treatment plans  with provider    Discussed plans with patient for ongoing care management follow up and provided patient with direct contact information for care management team    Screening for signs and symptoms of depression related to chronic disease state      Discussed the impact of chronic kidney disease on daily life and mental health and acknowledged and normalized feelings of disempowerment, fear, and frustration    Assessed social determinant of health barriers    Engage patient in early, proactive and ongoing discussion about goals of care and what matters most to them    Support coping and stress management by recognizing current strategies and assist in developing new strategies such as mindfulness, journaling, relaxation techniques, problem-solving    Last practice recorded BP readings:  BP Readings from Last 3 Encounters:  01/15/24 127/80  12/01/23 (!) 144/77  11/05/23 (!) 185/86   Most recent eGFR/CrCl:  Lab Results  Component Value Date   EGFR 13 (L) 11/05/2023    No components found for: "CRCL"  Patient Goals/Self-Care Activities:   Follow Up Plan:  Telephone follow up appointment with care management team member scheduled for:  01/28/24 @ 10:00am  The patient has been provided with contact information for the care management team and has been advised to  call with any health related questions or concerns.          Medication review  Reviewed current home medications -- provided education as needed. Patient is aware of potential side effects and was encouraged to notify PCP for any adverse side effects or  unwanted symptoms not relieved with interventions  Patient will call 911 for Medical Emergencies or Life -Threatening Symptoms.  Reviewed goals for care Patient/ Caregiver verbalizes understanding of instructions with the plan of care . The  Patient / Caregiver was encouraged to make informed decisions about care, actively participate in managing health conditions, and implement lifestyle changes as needed to promote independence and self-management of healthcare. SDOH screenings have been completed and addressed if indicted.  There are no reported barriers to care.    Follow-up Plan [facility] Case Management Nurse will provide follow-up and on-going assessment ,evaluation and education of disease processes, recommended interventions for both chronic and acute medical conditions ,  along with ongoing review of symptoms ,medication reviews / reconciliation during each weekly call . Any updates , inconsistencies, discrepancies or acute care concerns will be addressed and routed to the correct Practitioner if indicated   [facility]  Please call the care guide team at [phone]  if you need to cancel or reschedule your appointment . For scheduled calls -Three attempts will be made to reach you -if the scheduled call is missed or  we are unable to reach the you after 3 attempts no additional outreach attempts will be made and the TOC follow-up will be closed .   If you need to speak to a Nurse you may  call me directly at the number below or if I am unavailable,and  your need is urgent  please call the main [facility]I number at [phone] and ask to speak with one of the [facility] ( Transition of Care )  Nurses  .  Patient was encouraged to Contact PCP with any changes in baseline or  medication regimen,  changes in health status  /  well-being, safety concerns, including falls any questions or concerns regarding ongoing medical care, any difficulty obtaining or picking up prescriptions, any changes or  worsening in condition- including  symptoms not relieved  with interventions                                                                            Additionally, If you experience worsening of your symptoms, develop shortness of breath, If you are experiencing a medical emergency,  develop suicidal or homicidal thoughts you must seek medical attention immediately by calling 911 or report to your local emergency department or urgent care.   If you have a non-emergency medical problem during routine business hours, please contact your provider's office and ask to speak with a nurse.       Please take the time to read instructions/literature along with the possible adverse reactions/side effects for all the Medicines that have been prescribed to you. Only take newly prescribed  Medications after you have completely understood and accept all the possible adverse reactions/side effects.   Do not take more than prescribed Medications for  Pain, Sleep and Anxiety. Do not drive when taking Pain  medications or sleep aid/ insomnia  medications It is not advisable to combine anxiety, sleep and pain medications without talking with your primary care practitioner    If you are experiencing a Mental Health or Behavioral Health Crisis or need someone to talk to Please call the Suicide and Crisis Lifeline: 988 You may also call the [place] National Suicide Prevention Lifeline: 270-107-4838 or TTY: 732-146-2169 TTY 681-055-3952) to talk to a trained counselor.  You may call the Behavioral Health Crisis Line at 360-755-9199, at any time, 24 hours a day, 7 days a week- however If you are in danger or need immediate medical attention, call 911.   If you would like help to quit smoking, call 1-800-QUIT-NOW ( 2295088637) OR Espaol: 1-855-Djelo-Ya (7-253-664-4034) o para ms informacin haga clic aqu or Text READY to 742-595 to register via text.   Susa Loffler , BSN, RN [place]   VBCI-Population  Health RN Care Manager Direct Dial [phone]  Website: [email or web address]

## 2024-01-22 ENCOUNTER — Encounter (INDEPENDENT_AMBULATORY_CARE_PROVIDER_SITE_OTHER): Payer: Self-pay

## 2024-01-23 ENCOUNTER — Telehealth: Payer: Self-pay | Admitting: Nurse Practitioner

## 2024-01-23 NOTE — Telephone Encounter (Signed)
Copied from CRM [id number]. Topic: General - Other >> Jan 22, 2024  1:40 PM [place] B wrote: Reason for CRM: Sherri from [facility], called in to let Dr Caren Griffins know that they have received referral for pallative care, and [facility] will be following

## 2024-01-28 ENCOUNTER — Other Ambulatory Visit: Payer: Self-pay

## 2024-01-28 NOTE — Patient Outreach (Signed)
 Care Management  Transitions of Care Program Transitions of Care Post-discharge week 3   01/28/2024 Name: Derek Cooley. MRN: [id number] DOB: Jun 25, 1966  Subjective: Derek Cooley. is a 58 y.o. year old male who is a primary care patient of Larae Grooms, NP. The Care Management team Engaged with patient Engaged with patient by telephone to assess and address transitions of care needs.   Consent to Services:  Patient was given information about care management services, agreed to services, and gave verbal consent to participate.   Assessment:     Patient/ Caregiver  voices no new complaints or concerns  and has not developed/ reported any new medical issues / Dx or acute changes. -1/26-1/5 / 2025 He decided to stop Dialysis , and has no gone since last week. He had some medication changes as noted in that section. He started an ABT for possible ingrown hair follicle or boil. Small amount of drainage . They are applying warm compresses. He also started Oxycodone for pin management. His diuretics were stopped They report trace edema He has a hospital bed, so they are able to elevate his LE. He is followed by [facility] . SW and Nurse visits scheduled today. Both patient and family seem to to adjusting well to these decisions and patient is aware of outcomes of stopping Dialysis. He reports he is no longer smoking  Medication reconciliation/ review completed based on most recent medication list in EHR; and in review of recent Provider follow-up appointments- confirmed patient obtained / is taking  prescribed medications as instructed Caregiver manages medications; denies questions/ concerns or barriers to medication adherence at this time  Patient / Caregiver educated on red flag s/s to watch for based on current discharge diagnosis and was encouraged to report, any changes in baseline or  medication regimen,   or any new unmanaged side effects or symptoms not relieved  with interventions  to PCP and / or the  [facility] Case Management team .        SDOH Interventions    Flowsheet Row Patient Outreach from 01/21/2024 in [place] POPULATION HEALTH DEPARTMENT Telephone from 01/17/2024 in [place] POPULATION HEALTH DEPARTMENT ED to Hosp-Admission (Discharged) from 01/05/2024 in [facility] REGIONAL CARDIAC MED PCU Telephone from 10/04/2023 in [place] POPULATION HEALTH DEPARTMENT Clinical Support from 06/25/2023 in [facility] Office Visit from 06/10/2023 in [facility]  SDOH Interventions        Food Insecurity Interventions -- Intervention Not Indicated Inpatient TOC, Other (Comment)  [Resources added to AVS.] Intervention Not Indicated Intervention Not Indicated --  Housing Interventions -- Intervention Not Indicated -- -- Intervention Not Indicated --  Transportation Interventions -- Intervention Not Indicated, Patient Resources Dietitian) -- Intervention Not Indicated Intervention Not Indicated, Patient Resources Dietitian) --  Utilities Interventions Intervention Not Indicated -- -- -- Intervention Not Indicated --  Alcohol Usage Interventions -- -- -- -- Intervention Not Indicated (Score <7) --  Depression Interventions/Treatment  -- -- -- -- Medication Medication, Currently on Treatment  Financial Strain Interventions -- -- -- -- Intervention Not Indicated --  Physical Activity Interventions -- -- -- -- Patient Declined --  Stress Interventions -- -- -- -- Intervention Not Indicated --  Social Connections Interventions -- Other (Comment)  [Amb referral] Inpatient TOC, Other (Comment)  [Resources added to AVS.] -- Intervention Not Indicated --  Health Literacy Interventions -- -- -- -- Intervention Not Indicated --  Goals Addressed             This Visit's Progress    TOC Care Plan       Current Barriers:  Medication management New medications Provider appointments PCP and Cardiology  Nephrology- 2/12 Hospice evaluation  Care Coordination needs related to ADL IADL limitations, Social Isolation, and Depression  RNCM Clinical Goal(s):  Patient will work with the Care Management team over the next 30 days to address Transition of Care Barriers: Medication Management Support at home Provider appointments take all medications exactly as prescribed and will call provider for medication related questions as evidenced by no missed medication doses  attend all scheduled medical appointments: with PCP and Specialists as evidenced by no missed follow-up visits  work with Child psychotherapist to address  related to the management of Limited social support, Mental Health Concerns , and Social Isolation related to the management of ESRD and CKD   as evidenced by review of EMR and patient or Child psychotherapist report through collaboration with Medical illustrator, provider, and care team.   Interventions: Evaluation of current treatment plan related to  self management and patient's adherence to plan as established by provider  Transitions of Care:  Goal on track:  Yes. Doctor Visits  - discussed the importance of doctor visits Level of Care needs assessed with patient/caregiver, reviewed with patient/caregiver, options discussed with patient/caregiver, needs identified and provider notified, and education provided Troubleshoot use of DME in the home  Post discharge activity limitations prescribed by provider reviewed Reviewed Signs and symptoms of infection   Chronic Kidney Disease Interventions:  (Status:  Goal on track:  Yes.) Short Term Goal Reviewed prescribed diet Reg Heart Health Renal  Reviewed medications with patient and discussed importance of compliance    Reviewed scheduled/upcoming provider appointments including    Advised patient to discuss medicatiins ongoing Dialysis and treatment plans  with provider    Discussed plans with patient for ongoing care management follow up and provided  patient with direct contact information for care management team    Screening for signs and symptoms of depression related to chronic disease state      Discussed the impact of chronic kidney disease on daily life and mental health and acknowledged and normalized feelings of disempowerment, fear, and frustration    Assessed social determinant of health barriers    Engage patient in early, proactive and ongoing discussion about goals of care and what matters most to them    Support coping and stress management by recognizing current strategies and assist in developing new strategies such as mindfulness, journaling, relaxation techniques, problem-solving    Last practice recorded BP readings:  BP Readings from Last 3 Encounters:  01/15/24 127/80  12/01/23 (!) 144/77  11/05/23 (!) 185/86   Most recent eGFR/CrCl:  Lab Results  Component Value Date   EGFR 13 (L) 11/05/2023    No components found for: "CRCL"  Patient Goals/Self-Care Activities:   Follow Up Plan:  Telephone follow up appointment with care management team member scheduled for:  02/04/24 @ 10:00am  The patient has been provided with contact information for the care management team and has been advised to call with any health related questions or concerns.          Plan:   Routine follow-up and on-going assessment evaluation and education of disease processes, recommended interventions for both chronic and acute medical conditions , will occur during each weekly visit along with ongoing review of symptoms ,medication  reviews and reconciliation. Any updates , inconsistencies, discrepancies or acute care concerns will be addressed and routed to the correct Practitioner if indicated   Based on current information and Insurance plan -Reviewed benefits available to patient, including details about eligibility options for care if any area of needs were identified.  Reviewed patients ability to access and / or navigating the benefits  system..Amb Referral made if indicted , refer to orders section of note for details   Please refer to Care Plan for goals and interventions -Effectiveness of interventions, symptom management and outcomes will be evaluated  weekly during [facility] 30-day Program Outreach calls  . Any necessary  changes and updates to Care Plan will be completed episodically    Reviewed goals for care Patient verbalizes understanding of instructions and care plan provided. Patient was encouraged to make informed decisions about their care, actively participate in managing their health condition, and implement lifestyle changes as needed to promote independence and self-management of health care   Patient was encouraged to Contact PCP with any changes in baseline or  medication regimen,  changes in health status  /  well-being, safety concerns, including falls any questions or concerns regarding ongoing medical care, any difficulty obtaining or picking up prescriptions, any changes or worsening in condition- including  symptoms not relieved  with interventions    The patient has been provided with contact information for the care management team and has been advised to call with any health-related questions or concerns. Follow up as indicated with Care Team , or sooner should any new problems arise.  The patient has been provided with contact information for the care management team and has been advised to call with any health related questions or concerns.   Susa Loffler , BSN, RN [facility]   VBCI-Population Health RN Care Manager Direct Dial [phone]  Fax: [phone] Website: [email or web address]

## 2024-02-04 ENCOUNTER — Other Ambulatory Visit: Payer: Medicare Other | Admitting: *Deleted

## 2024-02-04 ENCOUNTER — Other Ambulatory Visit: Payer: Self-pay

## 2024-02-04 NOTE — Patient Outreach (Signed)
 Care Management  Transitions of Care Program Transitions of Care Post-discharge week 4  02/04/2024 Name: Derek Cooley. MRN: [id number] DOB: Feb 11, 1966  Subjective: Derek Cooley. is a 58 y.o. year old male who is a primary care patient of Larae Grooms, NP. The Care Management team was unable to reach the patient by phone to assess and address transitions of care needs.  Called this am ans spoke to both [name] and [name] both had low battery on phobe and requested a call back . Called this afternoon and LM   Plan: Additional outreach attempts will be made to reach the patient enrolled in the [facility] Program (Post Inpatient/ED Visit).  Patient  Outreach attempt is in the course of  [facility]BCI  30-day TOC program. Pt previously agreed and is enrolled in the  program due to potential risk for readmission and/or high utilization. Unfortunately, I was not able to speak with the patient in regards to recent hospital discharge   Left  a HIPAA compliant phone message message for patient including VBCI CM contact information with request for a call back in regards to recent hospital discharge and TOC  status review     Susa Loffler , BSN, RN [facility]   VBCI-Population Health RN Care Manager Direct Dial [phone]  Fax: [phone] Website: [email or web address]

## 2024-02-06 ENCOUNTER — Telehealth: Payer: Self-pay

## 2024-02-06 ENCOUNTER — Ambulatory Visit: Payer: Medicare Other | Admitting: Nurse Practitioner

## 2024-02-06 NOTE — Patient Outreach (Signed)
 Care Management  Transitions of Care Program Transitions of Care Post-discharge week 4  02/06/2024 Name: Derek Cooley. MRN: [id number] DOB: 1966-07-11  Subjective: Derek Cooley. is a 58 y.o. year old male who is a primary care patient of Larae Grooms, NP. The Care Management team was unable to reach the patient by phone to assess and address transitions of care needs. This is the 2nd attempt   Plan: Additional outreach attempts will be made to reach the patient enrolled in the [facility] Program (Post Inpatient/ED Visit). Spoke with DIL [place] briefly, She stated they were heading into the gym and she would callback when she was able to talk.    Susa Loffler , BSN, RN [facility]   VBCI-Population Health RN Care Manager Direct Dial [phone]  Fax: [phone] Website: [email or web address]

## 2024-02-07 ENCOUNTER — Telehealth: Payer: Self-pay

## 2024-02-07 NOTE — Patient Outreach (Signed)
 Care Management  Transitions of Care Program Transitions of Care Post-discharge week 4  02/07/2024 Name: Derek Cooley. MRN: [id number] DOB: Dec 13, 1965  Subjective: Derek Cooley. is a 58 y.o. year old male who is a primary care patient of Larae Grooms, NP. The Care Management team was unable to reach the patient by phone to assess and address transitions of care needs.   Plan: No further outreach attempts will be made at this time.  We have been unable to reach the patient.  Patient  Outreach attempt is in the course of  [facility]  30-day TOC program. The patient was previously provided with contact information for the [facility] care management team and has been advised to call with any health related questions or concerns.   Three attempts have been made to reach the patient . Attempt 1  02/04/2024 Attempt 2    2/27 /2025 Attempt 3   2/28 /2025  Based on the VBCI program guidelines, if  we are unable to reach the patient  after 3 attempts, no additional outreach attempts will be made and the TOC follow-up will be closed Unfortunately, we have been unable to make contact with the patient for follow up.He is enrolled with [facility] Palliative Care Team and has decided to stop Dialysis treatments    [facility] Case Management Team is available to follow up with the patient after provider conversation with the patient regarding recommendation for care management engagement and subsequent re-referral to the case management team.The [facility]BCI CM team can be reached by calling [phone]. Marland Kitchen   Susa Loffler , BSN, RN [facility]   VBCI-Population Health RN Care Manager Direct Dial [phone]  Fax: [phone] Website: [email or web address]

## 2024-03-10 DEATH — deceased
